# Patient Record
Sex: Male | Born: 1940
Health system: Southern US, Community
[De-identification: ages and names within clinical notes are randomized; demographics above are authoritative.]

## PROBLEM LIST (undated history)

## (undated) DIAGNOSIS — Z86010 Personal history of colonic polyps: Secondary | ICD-10-CM

## (undated) DIAGNOSIS — I10 Essential (primary) hypertension: Secondary | ICD-10-CM

## (undated) DIAGNOSIS — E785 Hyperlipidemia, unspecified: Secondary | ICD-10-CM

## (undated) DIAGNOSIS — K219 Gastro-esophageal reflux disease without esophagitis: Secondary | ICD-10-CM

## (undated) DIAGNOSIS — E119 Type 2 diabetes mellitus without complications: Secondary | ICD-10-CM

## (undated) DIAGNOSIS — M47812 Spondylosis without myelopathy or radiculopathy, cervical region: Secondary | ICD-10-CM

## (undated) DIAGNOSIS — G8929 Other chronic pain: Secondary | ICD-10-CM

## (undated) DIAGNOSIS — M4800 Spinal stenosis, site unspecified: Secondary | ICD-10-CM

## (undated) DIAGNOSIS — M7070 Other bursitis of hip, unspecified hip: Secondary | ICD-10-CM

## (undated) DIAGNOSIS — G4730 Sleep apnea, unspecified: Secondary | ICD-10-CM

## (undated) DIAGNOSIS — N390 Urinary tract infection, site not specified: Secondary | ICD-10-CM

## (undated) DIAGNOSIS — Z8661 Personal history of infections of the central nervous system: Secondary | ICD-10-CM

## (undated) DIAGNOSIS — K648 Other hemorrhoids: Secondary | ICD-10-CM

## (undated) DIAGNOSIS — I252 Old myocardial infarction: Secondary | ICD-10-CM

## (undated) DIAGNOSIS — M1990 Unspecified osteoarthritis, unspecified site: Secondary | ICD-10-CM

## (undated) DIAGNOSIS — R3915 Urgency of urination: Secondary | ICD-10-CM

## (undated) DIAGNOSIS — I700 Atherosclerosis of aorta: Secondary | ICD-10-CM

## (undated) DIAGNOSIS — G4733 Obstructive sleep apnea (adult) (pediatric): Secondary | ICD-10-CM

## (undated) DIAGNOSIS — M545 Low back pain, unspecified: Secondary | ICD-10-CM

## (undated) DIAGNOSIS — I2510 Atherosclerotic heart disease of native coronary artery without angina pectoris: Secondary | ICD-10-CM

## (undated) DIAGNOSIS — R351 Nocturia: Secondary | ICD-10-CM

## (undated) DIAGNOSIS — Z972 Presence of dental prosthetic device (complete) (partial): Secondary | ICD-10-CM

## (undated) DIAGNOSIS — F419 Anxiety disorder, unspecified: Secondary | ICD-10-CM

## (undated) DIAGNOSIS — I350 Nonrheumatic aortic (valve) stenosis: Secondary | ICD-10-CM

## (undated) DIAGNOSIS — R519 Headache, unspecified: Secondary | ICD-10-CM

## (undated) DIAGNOSIS — Z860101 Personal history of adenomatous and serrated colon polyps: Secondary | ICD-10-CM

## (undated) DIAGNOSIS — Z794 Long term (current) use of insulin: Secondary | ICD-10-CM

## (undated) DIAGNOSIS — R51 Headache: Secondary | ICD-10-CM

## (undated) DIAGNOSIS — R208 Other disturbances of skin sensation: Secondary | ICD-10-CM

## (undated) DIAGNOSIS — Z87442 Personal history of urinary calculi: Secondary | ICD-10-CM

## (undated) HISTORY — DX: Atherosclerotic heart disease of native coronary artery without angina pectoris: I25.10

## (undated) HISTORY — DX: Unspecified osteoarthritis, unspecified site: M19.90

## (undated) HISTORY — DX: Hyperlipidemia, unspecified: E78.5

## (undated) HISTORY — DX: Sleep apnea, unspecified: G47.30

## (undated) HISTORY — PX: EYE SURGERY: SHX253

## (undated) HISTORY — DX: Urinary tract infection, site not specified: N39.0

## (undated) HISTORY — PX: LAMINECTOMY: SHX219

## (undated) HISTORY — DX: Other chronic pain: G89.29

## (undated) HISTORY — PX: CHOLECYSTECTOMY: SHX55

## (undated) HISTORY — DX: Essential (primary) hypertension: I10

## (undated) HISTORY — PX: CARDIAC CATHETERIZATION: SHX172

## (undated) HISTORY — DX: Other hemorrhoids: K64.8

## (undated) HISTORY — PX: CARDIOVASCULAR STRESS TEST: SHX262

## (undated) HISTORY — PX: CARDIAC VALVE REPLACEMENT: SHX585

## (undated) HISTORY — PX: TOTAL KNEE ARTHROPLASTY: SHX125

## (undated) HISTORY — DX: Anxiety disorder, unspecified: F41.9

## (undated) HISTORY — PX: CORONARY ANGIOPLASTY WITH STENT PLACEMENT: SHX49

## (undated) HISTORY — DX: Nonrheumatic aortic (valve) stenosis: I35.0

## (undated) HISTORY — DX: Headache, unspecified: R51.9

## (undated) HISTORY — DX: Headache: R51

## (undated) HISTORY — DX: Personal history of infections of the central nervous system: Z86.61

## (undated) HISTORY — DX: Headache, unspecified: G89.29

## (undated) HISTORY — PX: JOINT REPLACEMENT: SHX530

---

## 1997-10-22 ENCOUNTER — Ambulatory Visit (HOSPITAL_COMMUNITY): Admission: RE | Admit: 1997-10-22 | Discharge: 1997-10-22 | Payer: Self-pay | Admitting: Internal Medicine

## 2000-12-22 ENCOUNTER — Ambulatory Visit (HOSPITAL_COMMUNITY): Admission: RE | Admit: 2000-12-22 | Discharge: 2000-12-22 | Payer: Self-pay | Admitting: General Surgery

## 2002-03-12 HISTORY — PX: ROTATOR CUFF REPAIR: SHX139

## 2002-05-15 ENCOUNTER — Encounter: Admission: RE | Admit: 2002-05-15 | Discharge: 2002-08-13 | Payer: Self-pay | Admitting: Specialist

## 2002-08-14 ENCOUNTER — Encounter: Admission: RE | Admit: 2002-08-14 | Discharge: 2002-09-13 | Payer: Self-pay | Admitting: Specialist

## 2003-07-11 ENCOUNTER — Ambulatory Visit (HOSPITAL_COMMUNITY): Admission: RE | Admit: 2003-07-11 | Discharge: 2003-07-11 | Payer: Self-pay | Admitting: Family Medicine

## 2003-09-18 ENCOUNTER — Encounter: Admission: RE | Admit: 2003-09-18 | Discharge: 2003-10-01 | Payer: Self-pay | Admitting: Family Medicine

## 2003-12-11 ENCOUNTER — Inpatient Hospital Stay (HOSPITAL_COMMUNITY): Admission: RE | Admit: 2003-12-11 | Discharge: 2003-12-16 | Payer: Self-pay | Admitting: Orthopedic Surgery

## 2003-12-31 ENCOUNTER — Encounter: Admission: RE | Admit: 2003-12-31 | Discharge: 2004-02-17 | Payer: Self-pay | Admitting: Orthopedic Surgery

## 2004-06-09 ENCOUNTER — Ambulatory Visit: Payer: Self-pay | Admitting: Cardiology

## 2004-06-10 ENCOUNTER — Ambulatory Visit: Admission: RE | Admit: 2004-06-10 | Discharge: 2004-06-10 | Payer: Self-pay | Admitting: Orthopedic Surgery

## 2004-06-12 ENCOUNTER — Ambulatory Visit: Payer: Self-pay | Admitting: Cardiology

## 2004-06-17 ENCOUNTER — Ambulatory Visit (HOSPITAL_COMMUNITY): Admission: RE | Admit: 2004-06-17 | Discharge: 2004-06-17 | Payer: Self-pay | Admitting: Cardiology

## 2004-06-17 ENCOUNTER — Ambulatory Visit: Payer: Self-pay | Admitting: Cardiology

## 2004-06-19 ENCOUNTER — Ambulatory Visit: Payer: Self-pay | Admitting: Cardiology

## 2004-07-31 ENCOUNTER — Inpatient Hospital Stay (HOSPITAL_COMMUNITY): Admission: RE | Admit: 2004-07-31 | Discharge: 2004-08-03 | Payer: Self-pay | Admitting: Orthopedic Surgery

## 2004-08-18 ENCOUNTER — Encounter: Admission: RE | Admit: 2004-08-18 | Discharge: 2004-09-17 | Payer: Self-pay | Admitting: Orthopedic Surgery

## 2004-09-18 ENCOUNTER — Encounter: Admission: RE | Admit: 2004-09-18 | Discharge: 2004-09-30 | Payer: Self-pay | Admitting: Orthopedic Surgery

## 2004-12-11 DIAGNOSIS — I252 Old myocardial infarction: Secondary | ICD-10-CM

## 2004-12-11 HISTORY — DX: Old myocardial infarction: I25.2

## 2004-12-16 ENCOUNTER — Ambulatory Visit: Payer: Self-pay | Admitting: Cardiology

## 2004-12-17 ENCOUNTER — Encounter: Payer: Self-pay | Admitting: Cardiology

## 2004-12-17 ENCOUNTER — Inpatient Hospital Stay (HOSPITAL_COMMUNITY): Admission: AD | Admit: 2004-12-17 | Discharge: 2004-12-20 | Payer: Self-pay | Admitting: Cardiology

## 2004-12-17 ENCOUNTER — Ambulatory Visit: Payer: Self-pay | Admitting: Internal Medicine

## 2005-01-14 ENCOUNTER — Ambulatory Visit: Payer: Self-pay | Admitting: Cardiology

## 2005-04-05 ENCOUNTER — Ambulatory Visit: Payer: Self-pay | Admitting: Internal Medicine

## 2005-04-05 ENCOUNTER — Ambulatory Visit (HOSPITAL_COMMUNITY): Admission: RE | Admit: 2005-04-05 | Discharge: 2005-04-05 | Payer: Self-pay | Admitting: Family Medicine

## 2005-04-05 ENCOUNTER — Encounter: Payer: Self-pay | Admitting: Vascular Surgery

## 2005-05-31 ENCOUNTER — Ambulatory Visit: Payer: Self-pay | Admitting: Internal Medicine

## 2005-06-17 ENCOUNTER — Ambulatory Visit: Payer: Self-pay | Admitting: Cardiology

## 2005-06-21 ENCOUNTER — Ambulatory Visit: Payer: Self-pay | Admitting: Cardiology

## 2005-07-20 ENCOUNTER — Encounter (INDEPENDENT_AMBULATORY_CARE_PROVIDER_SITE_OTHER): Payer: Self-pay | Admitting: Specialist

## 2005-07-20 ENCOUNTER — Ambulatory Visit: Payer: Self-pay | Admitting: Internal Medicine

## 2005-07-21 ENCOUNTER — Ambulatory Visit: Payer: Self-pay | Admitting: Cardiology

## 2006-02-21 ENCOUNTER — Ambulatory Visit: Payer: Self-pay | Admitting: Cardiology

## 2006-07-20 ENCOUNTER — Ambulatory Visit: Payer: Self-pay | Admitting: Cardiology

## 2006-07-29 ENCOUNTER — Ambulatory Visit: Payer: Self-pay | Admitting: Cardiology

## 2006-07-29 ENCOUNTER — Encounter: Payer: Self-pay | Admitting: Physician Assistant

## 2006-08-05 ENCOUNTER — Ambulatory Visit: Payer: Self-pay | Admitting: Cardiology

## 2007-06-11 ENCOUNTER — Ambulatory Visit: Payer: Self-pay | Admitting: Cardiology

## 2007-07-27 ENCOUNTER — Encounter: Payer: Self-pay | Admitting: Internal Medicine

## 2007-11-02 ENCOUNTER — Encounter: Payer: Self-pay | Admitting: Internal Medicine

## 2007-11-09 ENCOUNTER — Encounter: Payer: Self-pay | Admitting: Internal Medicine

## 2007-11-15 ENCOUNTER — Encounter: Payer: Self-pay | Admitting: Internal Medicine

## 2007-11-22 ENCOUNTER — Encounter: Payer: Self-pay | Admitting: Internal Medicine

## 2007-12-27 ENCOUNTER — Ambulatory Visit: Payer: Self-pay | Admitting: Internal Medicine

## 2007-12-27 DIAGNOSIS — R740 Nonspecific elevation of levels of transaminase and lactic acid dehydrogenase [LDH]: Secondary | ICD-10-CM

## 2007-12-27 DIAGNOSIS — R7401 Elevation of levels of liver transaminase levels: Secondary | ICD-10-CM | POA: Insufficient documentation

## 2007-12-27 DIAGNOSIS — K219 Gastro-esophageal reflux disease without esophagitis: Secondary | ICD-10-CM | POA: Insufficient documentation

## 2007-12-27 LAB — CONVERTED CEMR LAB
ALT: 25 units/L (ref 0–53)
AST: 25 units/L (ref 0–37)
Albumin: 3.6 g/dL (ref 3.5–5.2)
Alkaline Phosphatase: 76 units/L (ref 39–117)
Bilirubin, Direct: 0.2 mg/dL (ref 0.0–0.3)
Total Bilirubin: 0.8 mg/dL (ref 0.3–1.2)
Total Protein: 7 g/dL (ref 6.0–8.3)

## 2008-01-08 ENCOUNTER — Encounter: Payer: Self-pay | Admitting: Internal Medicine

## 2008-01-08 ENCOUNTER — Ambulatory Visit: Payer: Self-pay | Admitting: Internal Medicine

## 2008-01-08 HISTORY — PX: UPPER GASTROINTESTINAL ENDOSCOPY: SHX188

## 2008-01-14 ENCOUNTER — Encounter: Payer: Self-pay | Admitting: Internal Medicine

## 2008-02-17 ENCOUNTER — Encounter: Payer: Self-pay | Admitting: Cardiology

## 2008-02-20 ENCOUNTER — Encounter: Payer: Self-pay | Admitting: Cardiology

## 2008-02-23 ENCOUNTER — Observation Stay (HOSPITAL_COMMUNITY): Admission: EM | Admit: 2008-02-23 | Discharge: 2008-02-23 | Payer: Self-pay | Admitting: Emergency Medicine

## 2008-03-11 ENCOUNTER — Emergency Department (HOSPITAL_COMMUNITY): Admission: EM | Admit: 2008-03-11 | Discharge: 2008-03-11 | Payer: Self-pay | Admitting: Emergency Medicine

## 2008-03-12 ENCOUNTER — Encounter: Admission: RE | Admit: 2008-03-12 | Discharge: 2008-03-12 | Payer: Self-pay | Admitting: Gastroenterology

## 2008-03-14 ENCOUNTER — Ambulatory Visit (HOSPITAL_COMMUNITY): Admission: RE | Admit: 2008-03-14 | Discharge: 2008-03-14 | Payer: Self-pay | Admitting: Gastroenterology

## 2008-03-14 ENCOUNTER — Encounter: Payer: Self-pay | Admitting: Internal Medicine

## 2008-03-14 HISTORY — PX: ERCP: SHX60

## 2008-07-12 ENCOUNTER — Encounter (INDEPENDENT_AMBULATORY_CARE_PROVIDER_SITE_OTHER): Payer: Self-pay | Admitting: *Deleted

## 2008-08-15 ENCOUNTER — Ambulatory Visit: Payer: Self-pay | Admitting: Cardiology

## 2008-08-26 ENCOUNTER — Ambulatory Visit: Payer: Self-pay | Admitting: Internal Medicine

## 2008-08-26 DIAGNOSIS — Z860100 Personal history of colon polyps, unspecified: Secondary | ICD-10-CM | POA: Insufficient documentation

## 2008-08-30 LAB — CONVERTED CEMR LAB
ALT: 19 units/L (ref 0–53)
AST: 20 units/L (ref 0–37)
Albumin: 3.8 g/dL (ref 3.5–5.2)
Alkaline Phosphatase: 50 units/L (ref 39–117)
Bilirubin, Direct: 0.2 mg/dL (ref 0.0–0.3)
Total Bilirubin: 0.9 mg/dL (ref 0.3–1.2)
Total Protein: 7.2 g/dL (ref 6.0–8.3)

## 2008-09-02 ENCOUNTER — Encounter: Payer: Self-pay | Admitting: Internal Medicine

## 2008-09-10 ENCOUNTER — Ambulatory Visit: Payer: Self-pay | Admitting: Internal Medicine

## 2008-09-10 HISTORY — PX: COLONOSCOPY: SHX5424

## 2008-10-17 ENCOUNTER — Telehealth: Payer: Self-pay | Admitting: Cardiology

## 2008-10-17 ENCOUNTER — Encounter: Payer: Self-pay | Admitting: Cardiology

## 2008-10-17 DIAGNOSIS — I2510 Atherosclerotic heart disease of native coronary artery without angina pectoris: Secondary | ICD-10-CM | POA: Insufficient documentation

## 2008-10-19 DIAGNOSIS — I10 Essential (primary) hypertension: Secondary | ICD-10-CM

## 2008-10-19 DIAGNOSIS — I152 Hypertension secondary to endocrine disorders: Secondary | ICD-10-CM | POA: Insufficient documentation

## 2008-10-19 DIAGNOSIS — E1169 Type 2 diabetes mellitus with other specified complication: Secondary | ICD-10-CM | POA: Insufficient documentation

## 2008-10-19 DIAGNOSIS — E1159 Type 2 diabetes mellitus with other circulatory complications: Secondary | ICD-10-CM

## 2008-10-19 DIAGNOSIS — E785 Hyperlipidemia, unspecified: Secondary | ICD-10-CM

## 2008-10-22 ENCOUNTER — Ambulatory Visit: Payer: Self-pay | Admitting: Cardiology

## 2008-10-29 ENCOUNTER — Encounter (INDEPENDENT_AMBULATORY_CARE_PROVIDER_SITE_OTHER): Payer: Self-pay | Admitting: *Deleted

## 2008-12-11 ENCOUNTER — Inpatient Hospital Stay (HOSPITAL_COMMUNITY): Admission: RE | Admit: 2008-12-11 | Discharge: 2008-12-15 | Payer: Self-pay | Admitting: Orthopedic Surgery

## 2008-12-20 HISTORY — PX: TOTAL HIP ARTHROPLASTY: SHX124

## 2009-01-15 ENCOUNTER — Encounter: Payer: Self-pay | Admitting: Internal Medicine

## 2009-01-16 ENCOUNTER — Ambulatory Visit: Payer: Self-pay | Admitting: Internal Medicine

## 2009-01-16 DIAGNOSIS — J61 Pneumoconiosis due to asbestos and other mineral fibers: Secondary | ICD-10-CM | POA: Insufficient documentation

## 2009-01-16 DIAGNOSIS — R635 Abnormal weight gain: Secondary | ICD-10-CM | POA: Insufficient documentation

## 2009-08-18 IMAGING — US US ABDOMEN COMPLETE
1 series · 13 of 25 positions shown · non-contrast
Comparison: CT abdomen pelvis of 03/11/2008

CLINICAL DATA: Elevated liver function tests, abdominal pain

ABDOMEN ULTRASOUND
TECHNIQUE: Complete abdominal ultrasound examination was performed
including evaluation of the liver, gallbladder, bile ducts,
pancreas, kidneys, spleen, IVC, and abdominal aorta.

[Series 1: us abdomen complete · 0.41mm/px · 13 of 78 slices shown]
[im 1/78]
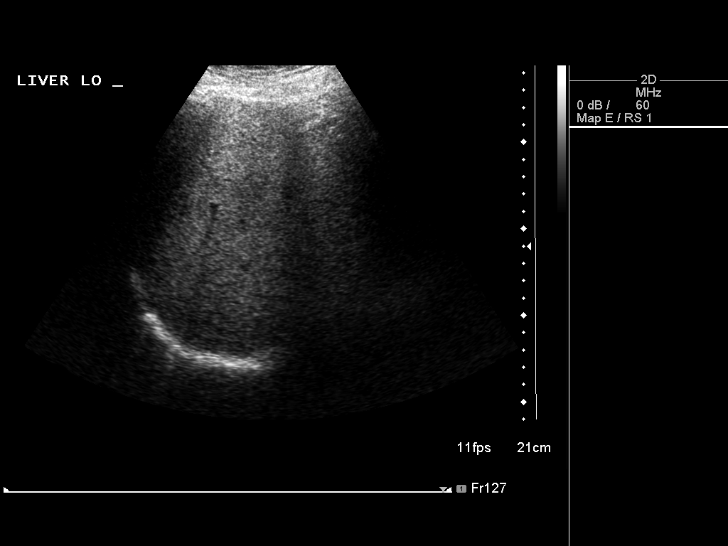
[im 7/78]
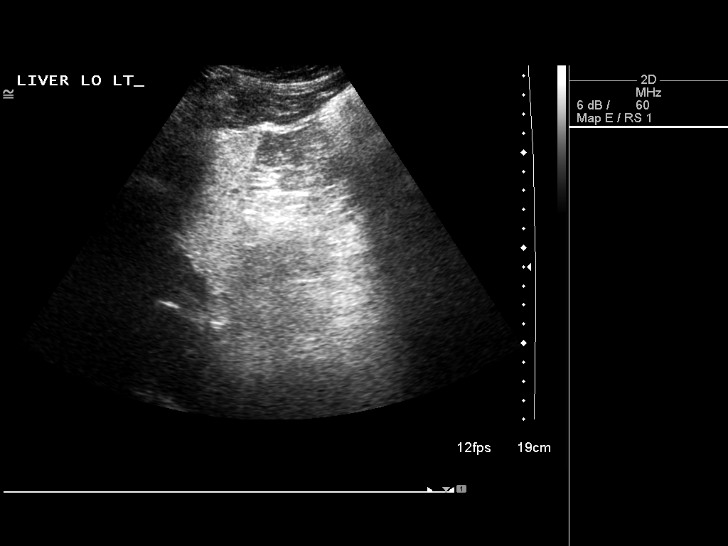
[im 13/78]
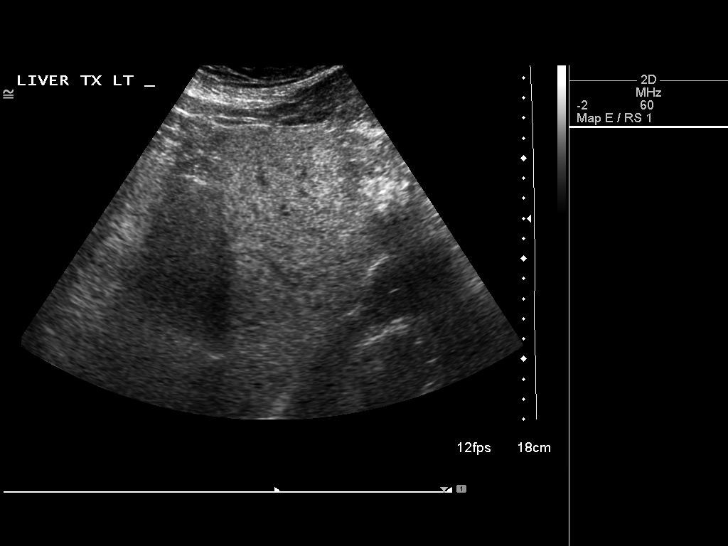
[im 20/78]
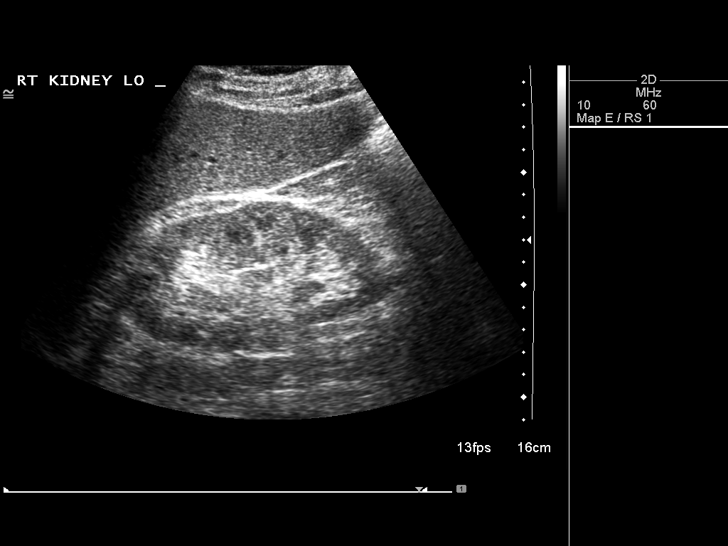
[im 26/78]
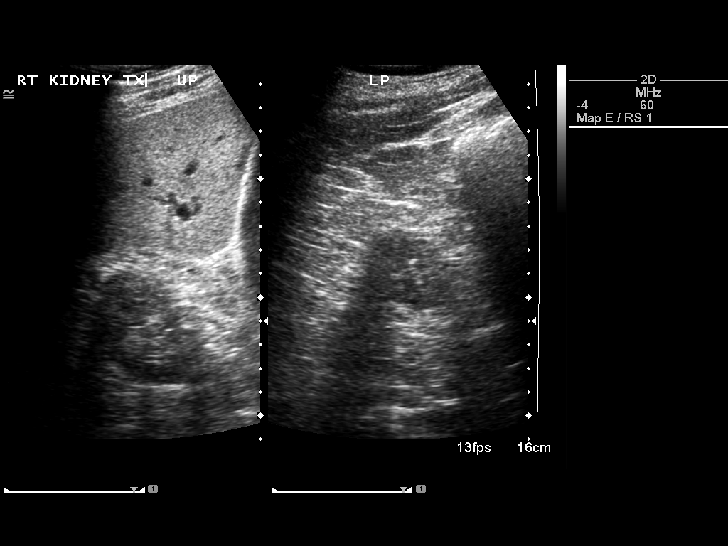
[im 33/78]
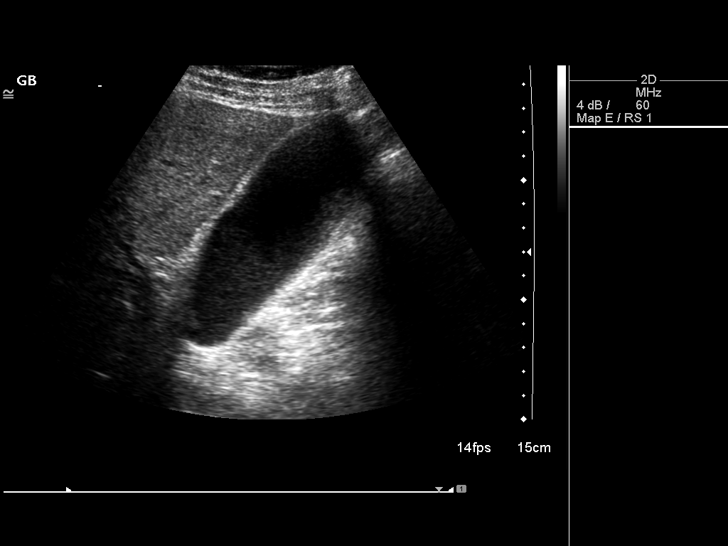
[im 39/78]
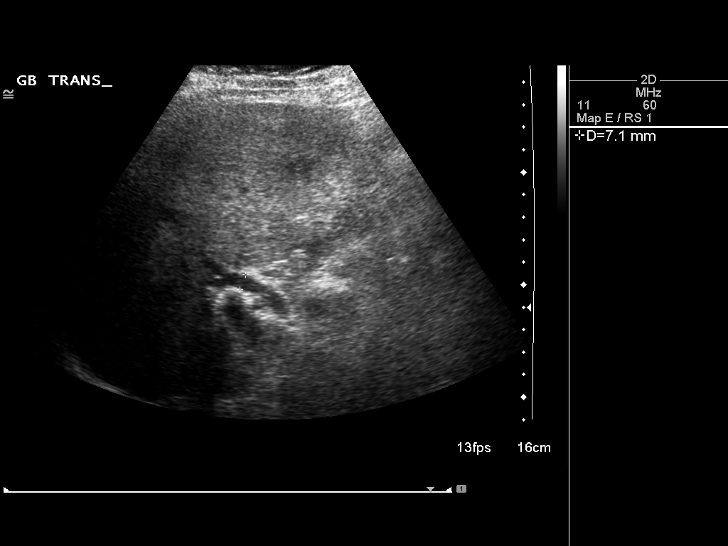
[im 45/78]
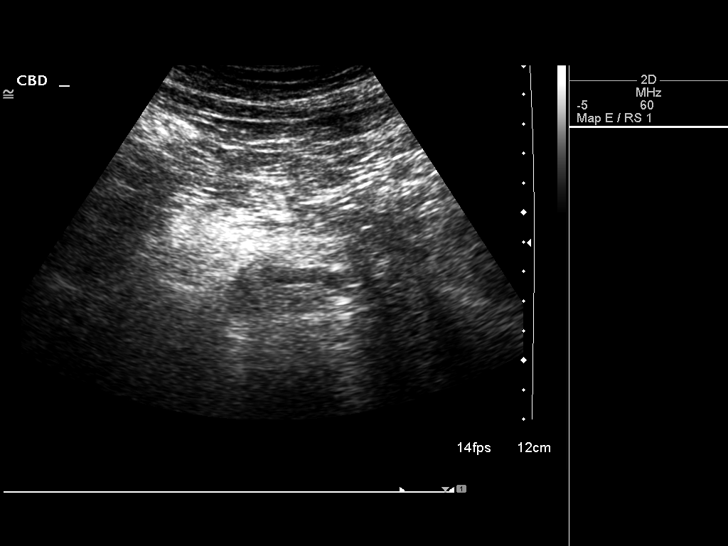
[im 52/78]
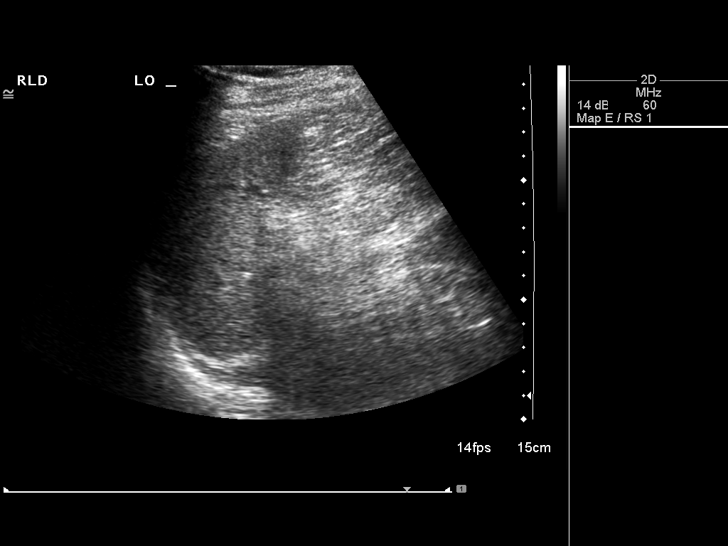
[im 58/78]
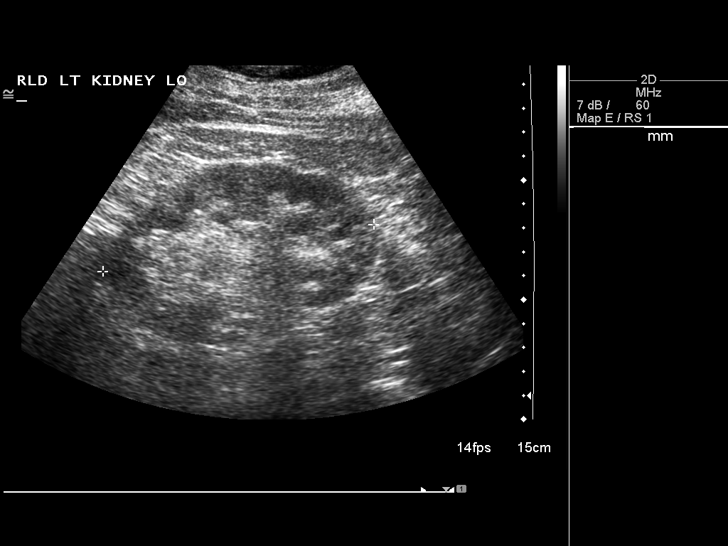
[im 65/78]
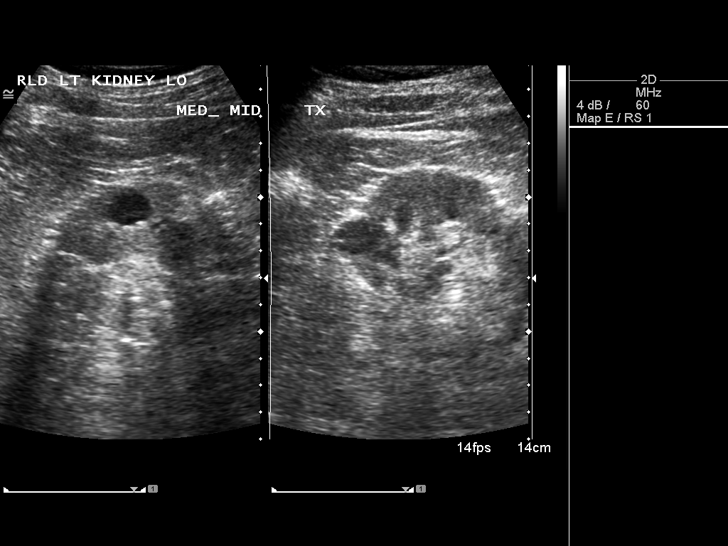
[im 71/78]
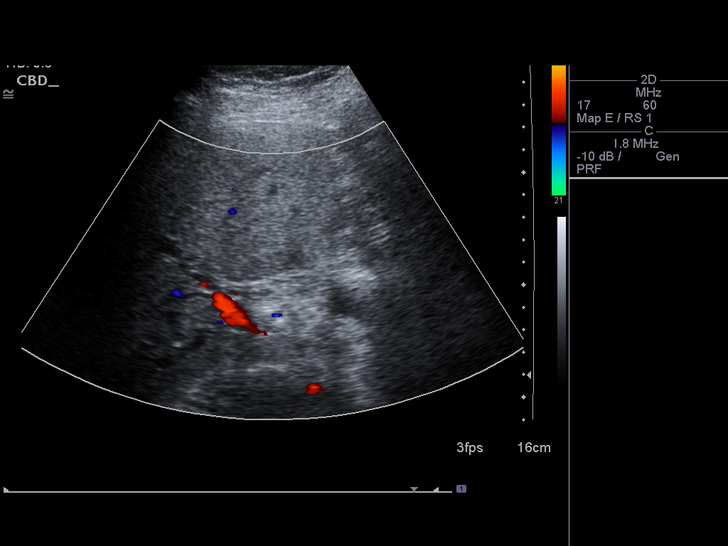
[im 78/78]
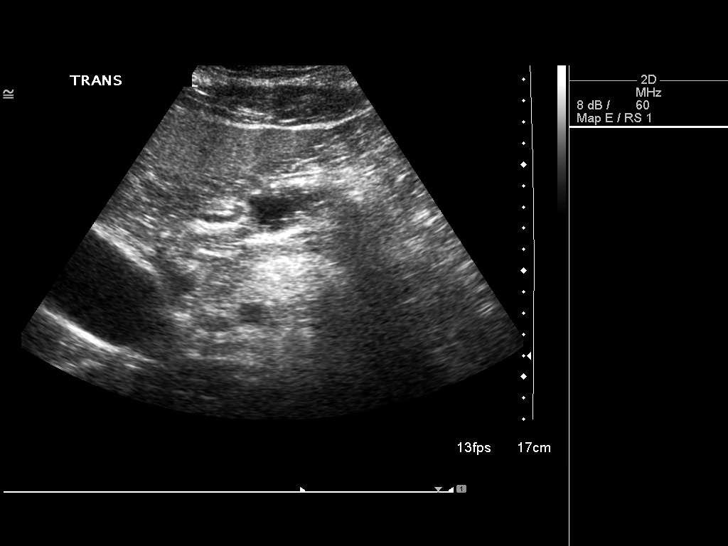

[13 of 25 positions shown; findings below may reference images not displayed]

FINDINGS: The gallbladder is slightly distended and there is some
sludge present.  There are ring down artifacts from the gallbladder
wall suggesting changes of adenomyomatosis.  No gallbladder
tenderness is noted upon palpation.  The liver is echogenic
consistent with fatty infiltration.  There is mild intrahepatic
ductal dilatation present, and the common bile duct is slightly
prominent measuring up to 7.1 mm in diameter.  However no definite
mass is seen.  The head of the pancreas is obscured by bowel gas
but was moderately well seen on CT yesterday with no evidence of
pancreatic head lesion.  ERCP may be helpful to assess for
stricture or distal common bile duct calculus versus subtle mass.
The IVC is obscured by bowel gas.  The spleen is normal.  No
hydronephrosis is noted.  The right kidney measures 12.5 cm
sagittally, with left kidney measuring 12.3 cm.  Renal cysts are
noted bilaterally with parapelvic cysts noted on the left.  The
aorta is partially obscured by bowel gas.
IMPRESSION: 1.  Slight prominence of intrahepatic bile ducts as well as minimal
prominence of the common bile duct.  No definite distal common bile
duct mass on yesterday's CT.  Consider distal common bile duct
stricture or calculus versus subtle mass.  ERCP may be helpful to
assess further.
2.  Changes of gallbladder adenomyomatosis.
3.  Bowel gas does obscure some anatomic detail as noted above.
Pancreatic head is not well seen.
4.  Fatty infiltration of the liver.

## 2009-08-20 IMAGING — RF DG ERCP WO/W SPHINCTEROTOMY
1 series · 5 of 5 positions shown · IV contrast (agent unspecified)
Comparison: Abdominal ultrasound 03/12/2008.  CT abdomen
03/11/2008.

CLINICAL DATA: 68-year-old male undergoing ERCP.

ERCP
Fluoroscopy Time: 5-minute-9-second.
Contrast: Not specified.

[Series 1: run · 5 of 5 slices shown]
[im 1/5]
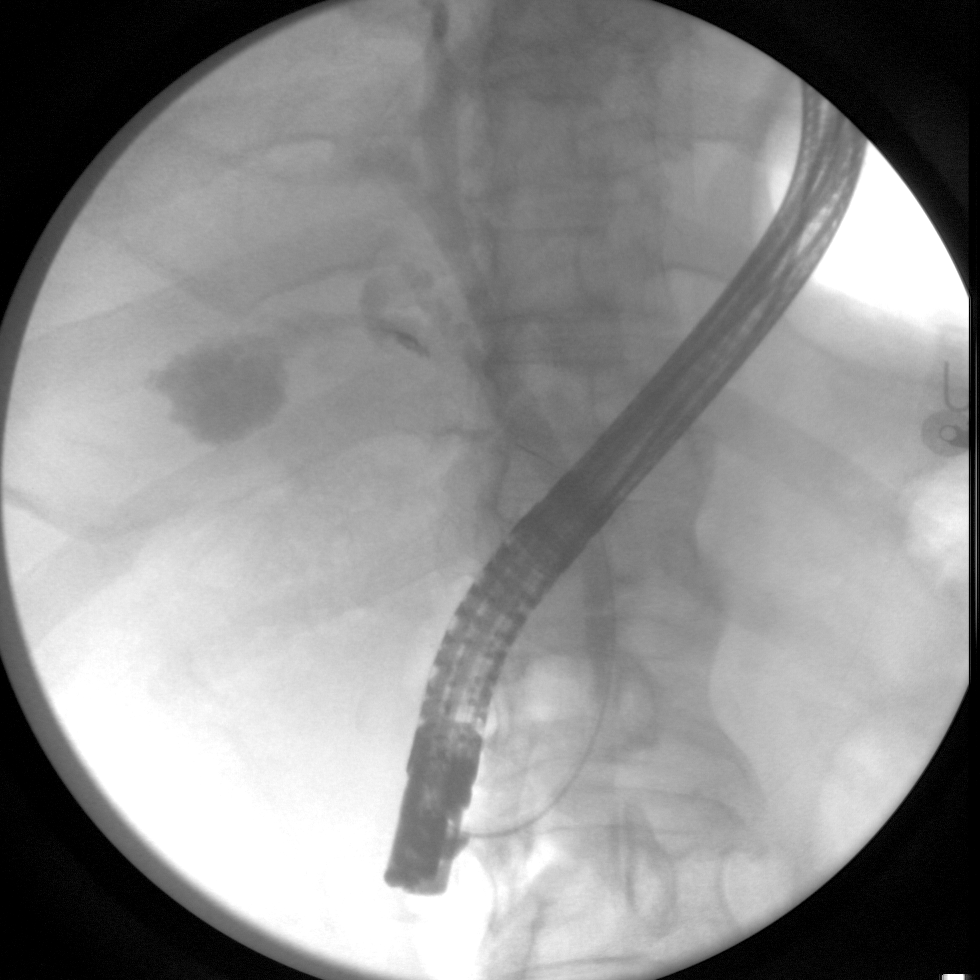
[im 2/5]
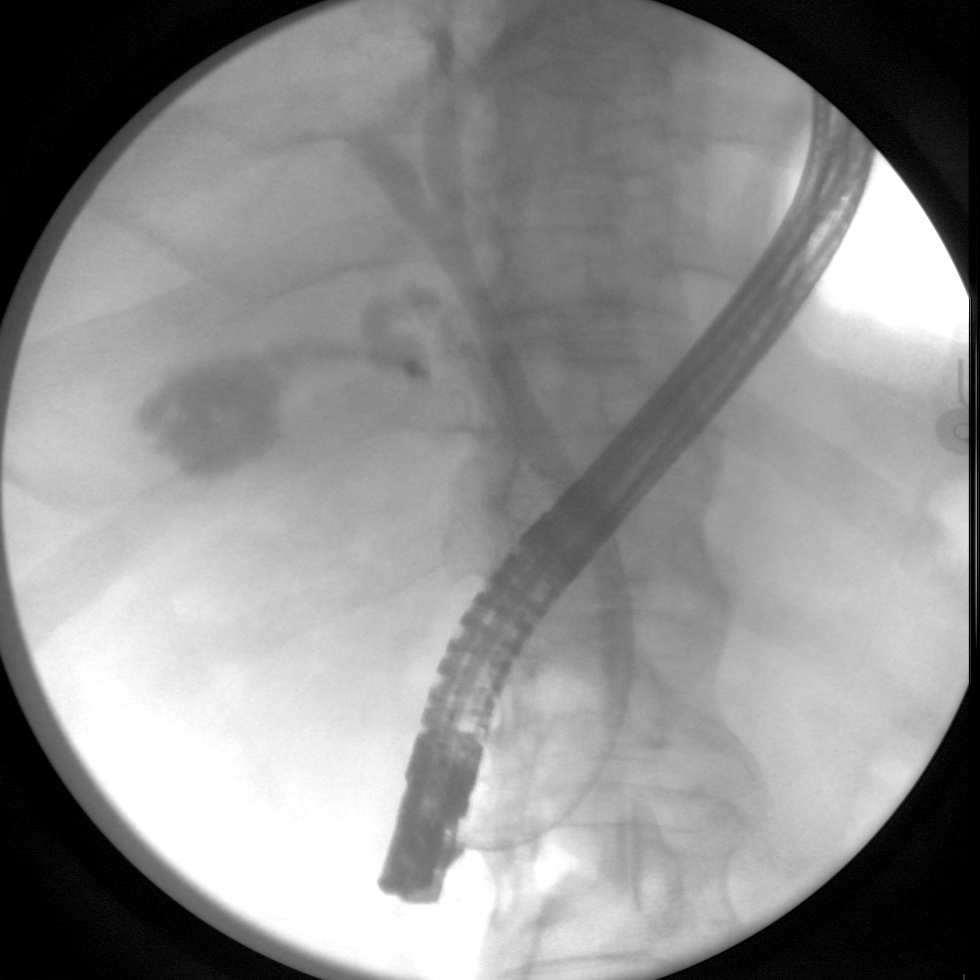
[im 3/5]
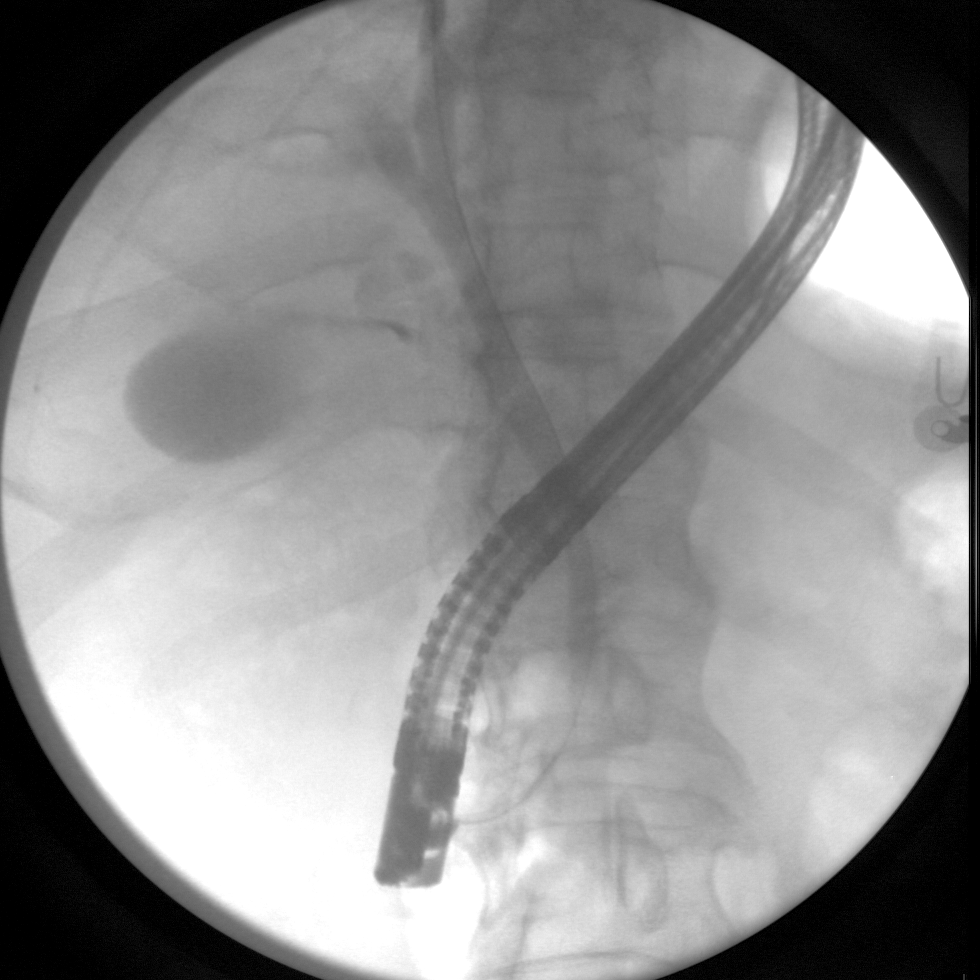
[im 4/5]
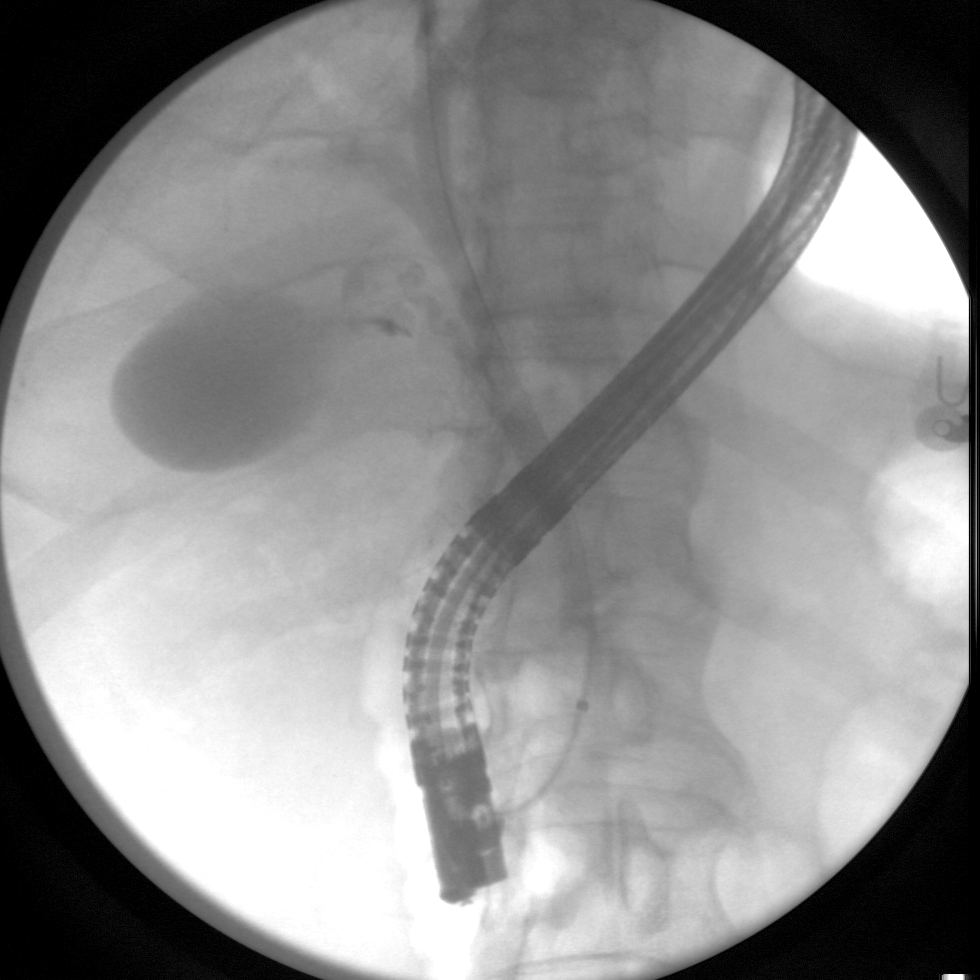
[im 5/5]
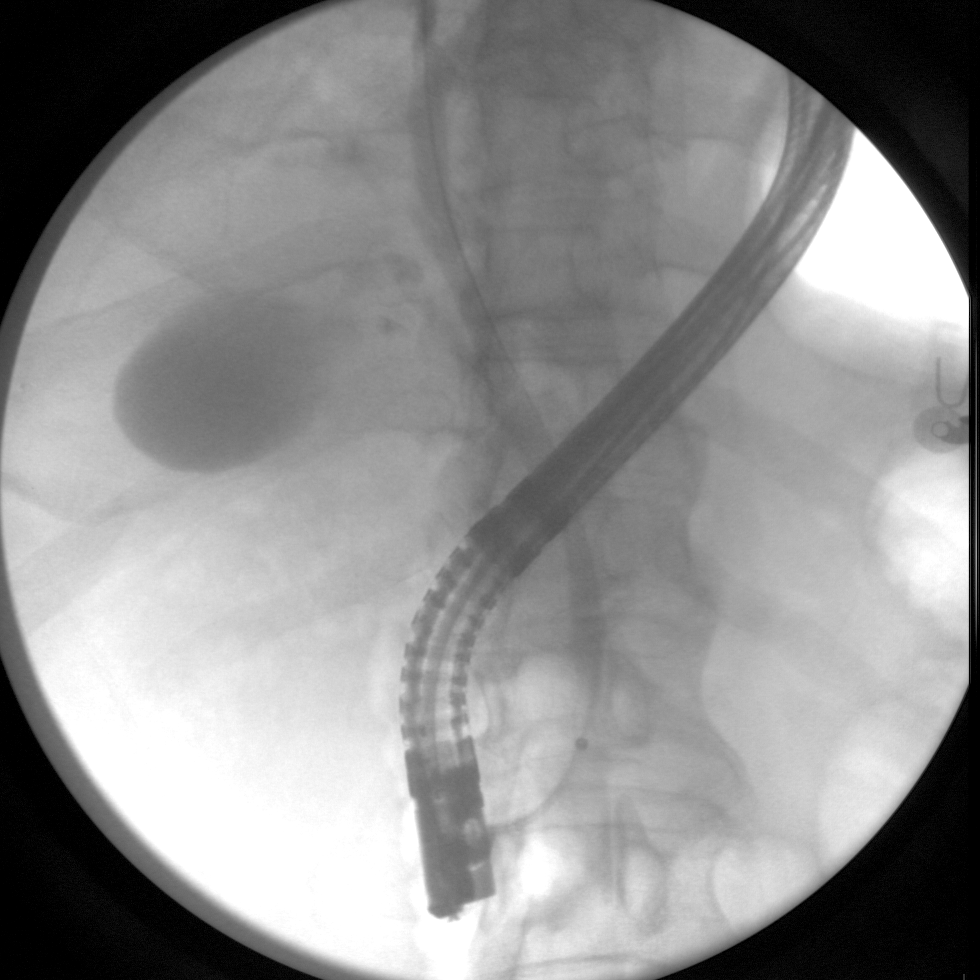

[5 of 5 positions shown; findings below may reference images not displayed]

FINDINGS: Five intraoperative fluoroscopic spot views coned-down on
the right upper quadrant.  Endoscope in place on the first view
with wire access to the CBD.  Small volume of contrast has been
injected outlining the intra and extra panic biliary ducts, the
cystic duct, and a portion of the gallbladder neck.  Oral contrast
is injected on the additional views.  No definite filling defects
are identified on these images.  The fourth image suggests balloon
sweeping of the duct.  Endoscope and wire access remain in place on
the final image.
IMPRESSION: Cannulation of the CBD with contrast outlining intra, extrahepatic
bile duct, the cystic duct and gallbladder neck. No definite
filling defects identified, correlate with endoscopic findings.

## 2009-09-25 ENCOUNTER — Ambulatory Visit: Payer: Self-pay | Admitting: Cardiology

## 2009-09-25 DIAGNOSIS — R079 Chest pain, unspecified: Secondary | ICD-10-CM | POA: Insufficient documentation

## 2009-10-13 ENCOUNTER — Encounter: Payer: Self-pay | Admitting: Cardiology

## 2009-12-27 ENCOUNTER — Encounter: Payer: Self-pay | Admitting: Cardiology

## 2009-12-29 ENCOUNTER — Encounter: Payer: Self-pay | Admitting: Cardiology

## 2010-02-10 NOTE — Letter (Signed)
Summary: [employer]  [employer]   Imported By: Sherian Rein 01/22/2009 09:56:22  _____________________________________________________________________  External Attachment:    Type:   Image     Comment:   External Document

## 2010-02-10 NOTE — Assessment & Plan Note (Signed)
Summary: Pulmonary/ new pt eval for ? asbestosis - insert ct report   Visit Type:  Initial Consult Copy to:  Helene Kelp PA Primary Provider/Referring Provider:  Rudi Heap, MD  CC:  Asbestosis.  History of Present Illness: 4 yowm smoker quit 1990's with good ex tolerance.  January 16, 2009 cc slowed by R hip and knees all replaced do notice doe x inclines x years , no real worsening and mild orthopnea.  no sign cough. Pt denies any significant sore throat, dysphagia, itching, sneezing,  nasal congestion or excess secretions,  fever, chills, sweats, unintended wt loss, pleuritic or exertional cp, hempoptysis, change in activity tolerance  orthopnea pnd or leg swelling    Current Medications (verified): 1)  Diovan Hct 320-12.5 Mg Tabs (Valsartan-Hydrochlorothiazide) .... Take 1 Tablet By Mouth Once A Day 2)  Metformin Hcl 500 Mg Xr24h-Tab (Metformin Hcl) .... Take 2 Tablets By Mouth Two Times A Day 3)  Aspirin Ec 325 Mg Tbec (Aspirin) .... Take 1 Tablet By Mouth Once A Day 4)  Paroxetine Hcl 20 Mg Tabs (Paroxetine Hcl) .... Take 1 Tablet By Mouth Once A Day 5)  Metoprolol Tartrate 25 Mg Tabs (Metoprolol Tartrate) .... Take 1/2 Tablet By Mouth Once Daily 6)  Osteo Bi-Flex Adv Triple St  Tabs (Misc Natural Products) .... Take 2 Tablets By Mouth Once Daily 7)  Crestor 10 Mg Tabs (Rosuvastatin Calcium) .... Take 1 Tablet By Mouth Once A Day 8)  Nitrostat 0.4 Mg Subl (Nitroglycerin) .... As Needed Chest Pains 9)  Lantus Solostar 100 Unit/ml Soln (Insulin Glargine) .... 30 Units At Bedtime 10)  Fenofibrate 160 Mg Tabs (Fenofibrate) .Marland Kitchen.. 1 Once Daily 11)  Famotidine 20 Mg  Tabs (Famotidine) .Marland Kitchen.. 1 Two Times A Day 30 Mins Before Breakfast and Supper 12)  Avalide 300-12.5 Mg Tabs (Irbesartan-Hydrochlorothiazide) .Marland Kitchen.. 1 Once Daily 13)  Celebrex 200 Mg Caps (Celecoxib) .Marland Kitchen.. 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 Family History of Breast Cancer:Sister Family History of Colon Cancer:Mother (diagnosed age 37) Family History of Heart Disease: Father, Brother  Social History: Occupation: Retired Married Patient is a former smoker. Quit Aug 1990.  Smoked for 25 yrs up to 1 ppd Alcohol Use - yes, very rare Daily Caffeine Use Illicit Drug Use - no Patient does not get regular exercise.  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 [phone]  Vital Signs:  Patient profile:   70 year old male Weight:      205 pounds O2 Sat:      95 % on Room air Temp:     97.5 degrees F oral Pulse rate:   65 / minute BP sitting:   140 / 74  (left arm)  Vitals Entered By: Vernie Murders (January 16, 2009 3:22 PM)  O2 Flow:  Room air  Serial Vital Signs/Assessments:  Comments: 4:13 PM Ambulatory Pulse Oximetry  Resting; HR__72___    02 Sat__97%ra___  Lap1 (185 feet)   HR__80___   02 Sat__96%ra___ Lap2 (185 feet)   HR__86___   02 Sat__94%ra___    Lap3 (185 feet)   HR__87___   02 Sat__94%ra___  _x__Test Completed without Difficulty ___Test Stopped due to:   By: Vernie Murders    Physical Exam  Additional Exam:  mod obese wm nad HEENT: nl dentition, turbinates, and orophanx. Nl external ear canals without cough reflex NECK :  without JVD/Nodes/TM/ nl carotid upstrokes bilaterally LUNGS: no acc muscle use, clear to A and P bilaterally without cough on insp or exp maneuvers CV:  RRR  no s3 or murmur or increase in P2,  no edema  ABD:  soft and nontender with nl excursion in the supine position. No bruits or organomegaly, bowel sounds nl MS:  warm without deformities, calf tenderness, cyanosis or clubbing SKIN: warm and dry without lesions   NEURO:  alert, approp, no deficits     Pulmonary Function Test Date: 01/16/2009 3:44 PM Gender: Male  Pre-Spirometry FVC    Value: 2.91 L/min   % Pred: 76.50 % FEV1    Value: 2.37 L     Pred: 2.80 L     % Pred: 84.70 % FEV1/FVC  Value: 81.57 %     % Pred: 110.20 %  Impression & Recommendations:  Problem # 1:  ASBESTOSIS (ICD-501) No sign restrictive changes or desats so follow conservatively  Problem # 2:  WEIGHT GAIN, ABNORMAL (ICD-783.1) Restrictive changes may be partly related to obesity.  Weight control is a matter of calorie balance which needs to be tilted in the pt's favor by eating less and exercising more.  Specifically, I recommended  exercise at a level where pt  is short of breath but not out of breath 30 minutes daily.  If not losing weight on this program, I would strongly recommend pt see a nutritionist with a food diary recorded for two weeks prior to the visit.     Medications Added to Medication List This Visit: 1)  Fenofibrate 160 Mg Tabs (Fenofibrate) .Marland Kitchen.. 1 once daily 2)  Avalide 300-12.5 Mg Tabs (Irbesartan-hydrochlorothiazide) .Marland Kitchen.. 1 once daily 3)  Celebrex 200 Mg Caps (Celecoxib) .Marland Kitchen.. 1 once daily  Other Orders: Pulse Oximetry, Ambulatory (16606) New Patient Level IV (30160)  Patient Instructions: 1)  We will send your lawyer a report of your studies 2)  your weight and asbestos have the same effect in terms of reducing lung volumes 3)  Weight control is simply a matter of calorie balance which needs to be tilted in your favor by eating less and exercising more.  To get the most out of exercise, you need to be continuously aware that you are short of breath, but never out of breath, for 30 minutes daily. As you improve, it will  actually be easier for you to do the same amount in  30 minutes so always push to the level where you are short of breath.  If this does not result in gradual weight reduction,  I recommend  a nutritionist for a food diary.   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

## 2010-02-10 NOTE — Assessment & Plan Note (Signed)
Summary: 1 YR FU PER AUG REMINDER   Visit Type:  Follow-up Referring Provider:  Helene Kelp PA Primary Provider:  Rudi Heap, MD   History of Present Illness: the patient is a 70 year old male with history of coronary artery disease, stent placement to the right coronary artery and residual mid 70% LAD disease.  The patient had a Cardiolite study in 2008 which was low risk.  He has a history of osteoarthritis.  He is hypertensive in the office today but denies any chest pain shortness of breath orthopnea PND he has no palpitations or syncope.  He TGE oxidase within normal limits.  He does report some positional related chest pain but has not required any nitroglycerin.  His pain appears to be very atypical.  Preventive Screening-Counseling & Management  Alcohol-Tobacco     Smoking Status: quit     Year Quit: 1991  Current Medications (verified): 1)  Diovan Hct 320-12.5 Mg Tabs (Valsartan-Hydrochlorothiazide) .... Take 1 Tablet By Mouth Once A Day 2)  Metformin Hcl 500 Mg Xr24h-Tab (Metformin Hcl) .... Take 2 Tablets By Mouth Two Times A Day 3)  Aspirin Ec 325 Mg Tbec (Aspirin) .... Take 1 Tablet By Mouth Once A Day 4)  Paroxetine Hcl 20 Mg Tabs (Paroxetine Hcl) .... Take 1 Tablet By Mouth Once A Day 5)  Metoprolol Tartrate 25 Mg Tabs (Metoprolol Tartrate) .... Take 1/2 Tablet By Mouth Once Daily 6)  Osteo Bi-Flex Adv Triple St  Tabs (Misc Natural Products) .... Take 2 Tablets By Mouth Once Daily 7)  Crestor 10 Mg Tabs (Rosuvastatin Calcium) .... Take 1 Tablet By Mouth Once A Day 8)  Nitrostat 0.4 Mg Subl (Nitroglycerin) .... As Needed Chest Pains 9)  Lantus Solostar 100 Unit/ml Soln (Insulin Glargine) .... Use As Directed 10)  Famotidine 20 Mg  Tabs (Famotidine) .Marland Kitchen.. 1 Two Times A Day 30 Mins Before Breakfast and Supper 11)  Novolog 100 Unit/ml Soln (Insulin Aspart) .... Sliding Scale Use As Directed  Allergies (verified): 1)  * Altace 2)  Doxycycline 3)   Percocet  Comments:  Nurse/Medical Assistant: The patient's medication list and allergies were reviewed with the patient and were updated in the Medication and Allergy Lists.  Past History:  Past Medical History: Diabetes Anxiety Disorder Arthritis HYPERTENSION, UNSPECIFIED (ICD-401.9) HYPERLIPIDEMIA-MIXED (ICD-272.4) CORONARY ATHEROSCLEROSIS NATIVE CORONARY ARTERY (ICD-414.01) status post stent placement to the right coronary artery residual mid 70% LAD disease Cardiolite study 2000 and a low risk.  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  The patient denies fatigue, malaise, fever, weight gain/loss, vision loss, decreased hearing, hoarseness, palpitations, shortness of breath, prolonged cough, wheezing, sleep apnea, coughing up blood, abdominal pain, blood in stool, nausea, vomiting, diarrhea, heartburn, incontinence, blood in urine, muscle weakness, joint pain, leg swelling, rash, skin lesions, headache, fainting, dizziness, depression, anxiety, enlarged lymph nodes, easy bruising or bleeding, and environmental allergies.    Vital Signs:  Patient profile:   70 year old male Height:      66 inches Weight:      211 pounds BMI:     34.18 Pulse rate:   68 / minute BP sitting:   176 / 79  (left arm) Cuff size:   regular  Vitals Entered By: Carlye Grippe (September 25, 2009 10:45 AM)  Nutrition Counseling: Patient's BMI is greater than 25 and therefore counseled on weight management options.  Physical Exam  Additional Exam:  General: Well-developed, well-nourished in no distress head: Normocephalic and atraumatic eyes PERRLA/EOMI  intact, conjunctiva and lids normal nose: No deformity or lesions mouth normal dentition, normal posterior pharynx neck: Supple, no JVD.  No masses, thyromegaly or abnormal cervical nodes lungs: Normal breath sounds bilaterally without wheezing.  Normal percussion heart: regular rate and rhythm with normal S1 and S2, no S3 or S4.  PMI is normal.  No pathological murmurs abdomen: Normal bowel sounds, abdomen is soft and nontender without masses, organomegaly or hernias noted.  No hepatosplenomegaly musculoskeletal: Back normal, normal gait muscle strength and tone normal pulsus: Pulse is normal in all 4 extremities Extremities: No peripheral pitting edema neurologic: Alert and oriented x 3 skin: Intact without lesions or rashes cervical nodes: No significant adenopathy psychologic: Normal affect    EKG  Procedure date:  09/28/2009  Findings:      normal sinus rhythm.  Normal EKG heart rate 63 beats/min  Impression & Recommendations:  Problem # 1:  CORONARY ATHEROSCLEROSIS NATIVE CORONARY ARTERY (ICD-414.01) atypical chest pain.  No definite indication for ischemia workup.  Patient has not required nitroglycerin. His updated medication list for this problem includes:    Aspirin Ec 325 Mg Tbec (Aspirin) .Marland Kitchen... Take 1 tablet by mouth once a day    Metoprolol Tartrate 25 Mg Tabs (Metoprolol tartrate) .Marland Kitchen... Take 1/2 tablet by mouth once daily    Nitrostat 0.4 Mg Subl (Nitroglycerin) .Marland Kitchen... As needed chest pains  Problem # 2:  HYPERLIPIDEMIA-MIXED (ICD-272.4) followed by primary care physician. The following medications were removed from the medication list:    Fenofibrate 160 Mg Tabs (Fenofibrate) .Marland Kitchen... 1 once daily His updated medication list for this problem includes:    Crestor 10 Mg Tabs (Rosuvastatin calcium) .Marland Kitchen... Take 1 tablet by mouth once a day  Problem # 3:  HYPERTENSION, UNSPECIFIED (ICD-401.9) the patient is significantly hypertensive.  We asked him to make recording of  his blood pressure.  We reviewed this and if appropriate adjustments in his antihypertensive medical regimen will be made. The following medications were removed from the medication list:    Avalide 300-12.5 Mg Tabs (Irbesartan-hydrochlorothiazide) .Marland Kitchen... 1 once daily His updated medication list for this problem includes:    Diovan Hct 320-12.5 Mg Tabs (Valsartan-hydrochlorothiazide) .Marland Kitchen... Take 1 tablet by mouth once a day    Aspirin Ec 325 Mg Tbec (Aspirin) .Marland Kitchen... Take 1 tablet by mouth once a day    Metoprolol Tartrate 25 Mg Tabs (Metoprolol tartrate) .Marland Kitchen... Take 1/2 tablet by mouth once daily  Other Orders: EKG w/ Interpretation (93000)  Patient Instructions: 1)  Your physician recommends that you continue on your current medications as directed. Please refer to the Current Medication list given to you today. 2)  Your physician wants you to follow-up in: 1 YEAR. You will receive a reminder letter in the mail about two months in advance. If you don't receive a letter, please call our office to schedule the follow-up appointment. 3)  Monitor blood pressures times 2 weeks and send results to our office or drop off at front desk before any med changes for blood pressure. Prescriptions: NITROSTAT 0.4 MG SUBL (NITROGLYCERIN) as needed chest pains  #25 x 2   Entered by:   Carlye Grippe   Authorized by:   Lewayne Bunting, MD, [facility]  Signed by:   Carlye Grippe on 09/25/2009   Method used:   Electronically to        [facility]  [id number] (retail)       [address]       [place], [place]  [address]       Ph: [phone] or [phone]       Fax: [phone]   RxID:   [id number]

## 2010-02-10 NOTE — Progress Notes (Signed)
Summary: Office Visit/ BLOOD PRESSURE READINGS  Office Visit/ BLOOD PRESSURE READINGS   Imported By: Dorise Hiss 10/13/2009 11:07:48  _____________________________________________________________________  External Attachment:    Type:   Image     Comment:   External Document  Appended Document: Office Visit/ BLOOD PRESSURE READINGS suggest to add amlodipine 5 mg p.o. q. daily for better blood pressure control  Appended Document: Office Visit/ BLOOD PRESSURE READINGS Left message to return call.   Appended Document: Office Visit/ BLOOD PRESSURE READINGS Patient notified.  Does not want to add another pill.  He questions if any of his meds could be changed to something different or anything consolidated.  Has to pay out alot every month for insurance and medications.  Appended Document: Office Visit/ BLOOD PRESSURE READINGS Would continue wiith current regimen then.   Appended Document: Office Visit/ BLOOD PRESSURE READINGS Patient notified via answering machine.

## 2010-02-10 NOTE — Cardiovascular Report (Signed)
Summary: Cardiac Catheterization  Cardiac Catheterization   Imported By: Dorise Hiss 09/23/2009 16:56:32  _____________________________________________________________________  External Attachment:    Type:   Image     Comment:   External Document

## 2010-02-12 NOTE — Letter (Signed)
Summary: [facility] D/C DR. Wende Crease  [facility] D/C DR. Wende Crease   Imported By: Zachary George 01/19/2010 11:05:01  _____________________________________________________________________  External Attachment:    Type:   Image     Comment:   External Document

## 2010-02-12 NOTE — Medication Information (Signed)
Summary: [facility] D/C MEDICATION ORDERS  [facility] D/C MEDICATION ORDERS   Imported By: Zachary George 01/19/2010 11:08:58  _____________________________________________________________________  External Attachment:    Type:   Image     Comment:   External Document

## 2010-02-12 NOTE — Consult Note (Signed)
Summary: [facility] -CONSULT-DR. TESFAYE  [facility] -CONSULT-DR. TESFAYE   Imported By: Zachary George 01/19/2010 11:04:31  _____________________________________________________________________  External Attachment:    Type:   Image     Comment:   External Document

## 2010-02-26 ENCOUNTER — Encounter (INDEPENDENT_AMBULATORY_CARE_PROVIDER_SITE_OTHER): Payer: Medicare Other | Admitting: Cardiology

## 2010-02-26 ENCOUNTER — Encounter: Payer: Self-pay | Admitting: Cardiology

## 2010-02-26 DIAGNOSIS — I10 Essential (primary) hypertension: Secondary | ICD-10-CM

## 2010-03-02 ENCOUNTER — Telehealth (INDEPENDENT_AMBULATORY_CARE_PROVIDER_SITE_OTHER): Payer: Self-pay | Admitting: *Deleted

## 2010-03-10 NOTE — Progress Notes (Signed)
Summary: REQUEST CLEARNACE FOR HERNIA REPAIR  Phone Note Call from Patient Call back at [phone]   Caller: Spouse Call For: nurse Summary of Call: message left on voicemail that patient need clearance for hernia repair that Degent informed him about at his last visit sent to [facility]. Initial call taken by: Carlye Grippe,  March 02, 2010 2:05 PM  Follow-up for Phone Call        Pt's wife called back to the office today. She states [facility] (Dr. Catalina Gravel) is requesting clearance from [place] before they will schedule an appt. Notified that message will be sent to Dr. Earnestine Leys regarding this request and she will be contacted as soon as he is able to review and respond to message. Pt's wife verbalized understanding. Follow-up by: Cyril Loosen, RN, BSN,  March 03, 2010 11:35 AM  Additional Follow-up for Phone Call Additional follow up Details #1::        patient can be cleared for surgery  see the addendum to this office visit note Additional Follow-up by: Lewayne Bunting, MD, [facility],  March 03, 2010 3:25 PM    Additional Follow-up for Phone Call Additional follow up Details #2::    Left message to notify wife that note w/clearance will be faxed to [facility]. Follow-up by: Cyril Loosen, RN, BSN,  March 03, 2010 3:56 PM

## 2010-03-10 NOTE — Assessment & Plan Note (Signed)
Summary: EPH D/C [facility] 12/29/09   Visit Type:  Follow-up Referring Provider:  Helene Kelp PA Primary Provider:  Rudi Heap, MD   History of Present Illness: the patient is a 70 and-year-old male with history of coronary artery disease, stent placement to the right coronary artery and residual mid 70% LAD disease.  The patient had a Cardiolite study in 2008 which was low risk.and the patient is unable to date is inhibitors or ARB secondary to angioedema.  In December he had a complex migraine headache presenting itself with dysphagia.   He had an echocardiogram done which showed an ejection fraction of 50 to 55%.  He had negative carotid Dopplers.  The patient also the nuclear scan done in 2010 which showed no ischemia.  Cardiac perspective he is doing well.  He denies any chest pain shortness of breath orthopnea PND.  He denies any palpitations.  Blood pressure is poorly controlled.  We plan to implement medication changes in the office visit today.  Preventive Screening-Counseling & Management  Alcohol-Tobacco     Smoking Status: quit     Year Quit: 1991  Current Medications (verified): 1)  Metformin Hcl 500 Mg Xr24h-Tab (Metformin Hcl) .... Take 2 Tablets By Mouth Two Times A Day 2)  Aspirin Ec 325 Mg Tbec (Aspirin) .... Take 1 Tablet By Mouth Once A Day 3)  Paroxetine Hcl 20 Mg Tabs (Paroxetine Hcl) .... Take 1 Tablet By Mouth Once A Day 4)  Metoprolol Tartrate 25 Mg Tabs (Metoprolol Tartrate) .... Take 1/2 Tablet By Mouth Once Daily 5)  Crestor 10 Mg Tabs (Rosuvastatin Calcium) .... Take 1 Tablet By Mouth Once A Day 6)  Nitrostat 0.4 Mg Subl (Nitroglycerin) .... As Needed Chest Pains 7)  Lantus Solostar 100 Unit/ml Soln (Insulin Glargine) .... Use As Directed 8)  Famotidine 20 Mg  Tabs (Famotidine) .Marland Kitchen.. 1 Two Times A Day 30 Mins Before Breakfast and Supper 9)  Novolog 100 Unit/ml Soln (Insulin Aspart) .... Sliding Scale Use As Directed 10)  Clonidine Hcl 0.1 Mg Tabs (Clonidine  Hcl) .... Take At 1 Am and 1 Pm 11)  Zyrtec Allergy 10 Mg Caps (Cetirizine Hcl) .... Take 1 Tablet By Mouth Once A Day 12)  Vitamin D 2000 Unit Tabs (Cholecalciferol) .... Take 1 Tablet By Mouth Once A Day  Allergies (verified): 1)  * Altace 2)  Doxycycline 3)  Percocet  Comments:  Nurse/Medical Assistant: The patient's medications and allergies were reviewed with the patient and were updated in the Medication and Allergy Lists. Tammi Romine CMA (February 26, 2010 11:41 AM)  Past History:  Past Medical History: Last updated: 09/25/2009 Diabetes Anxiety Disorder Arthritis HYPERTENSION, UNSPECIFIED (ICD-401.9) HYPERLIPIDEMIA-MIXED (ICD-272.4) CORONARY ATHEROSCLEROSIS NATIVE CORONARY ARTERY (ICD-414.01) status post stent placement to the right coronary artery residual mid 70% LAD disease Cardiolite study 2000 and a low risk.  Normal ejection fraction ABDOMINAL PAIN, UPPER, EPISODIC (ICD-789.09) COLONIC POLYPS, ADENOMATOUS, HX OF (ICD-V12.72) ADENOMYOMATOSIS OF GALLBLADDER (ICD-793.3) HEARTBURN (ICD-787.1) ABNORMAL TRANSAMINASE-LFT'S (ICD-790.4) ?ASBESTOSIS    - CT Chest 01/15/2009 [facility]) no evidence of asbestosis or asbestos related pleural dz  Past Surgical History: Last updated: 12/27/2007 PTCA-Stent Knee Replacement Rotator Cuff Repair  Family History: Last updated: 12/27/2007 Family History of Breast Cancer:Sister Family History of Colon Cancer:Mother (diagnosed age 56) Family History of Heart Disease: Father, Brother  Social History: Last updated: 01/16/2009 Occupation: Retired Married Patient is a former smoker. Quit Aug 1990.  Smoked for 25 yrs up to 1 ppd Alcohol Use - yes, very rare  Daily Caffeine Use Illicit Drug Use - no Patient does not get regular exercise.  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 [phone]  Risk Factors: Exercise: no (12/27/2007)  Risk Factors: Smoking Status: quit (02/26/2010)  Review of  Systems  The patient denies fatigue, malaise, fever, weight gain/loss, vision loss, decreased hearing, hoarseness, chest pain, palpitations, shortness of breath, prolonged cough, wheezing, sleep apnea, coughing up blood, abdominal pain, blood in stool, nausea, vomiting, diarrhea, heartburn, incontinence, blood in urine, muscle weakness, joint pain, leg swelling, rash, skin lesions, headache, fainting, dizziness, depression, anxiety, enlarged lymph nodes, easy bruising or bleeding, and environmental allergies.         dry mouth  Vital Signs:  Patient profile:   70 year old male Height:      66 inches Weight:      215 pounds BMI:     34.83 Pulse rate:   48 / minute BP sitting:   166 / 83  (left arm) Cuff size:   large  Vitals Entered By: Fuller Plan CMA (February 26, 2010 11:33 AM)   Physical Exam  Additional Exam:  General: Well-developed, well-nourished in no distress head: Normocephalic and atraumatic eyes PERRLA/EOMI intact, conjunctiva and lids normal nose: No deformity or lesions mouth normal dentition, normal posterior pharynx neck: Supple, no JVD.  No masses, thyromegaly or abnormal cervical nodes lungs: Normal breath sounds bilaterally without wheezing.  Normal percussion heart: regular rate and rhythm with normal S1 and S2, no S3 or S4.  PMI is normal.  No pathological murmurs abdomen: Normal bowel sounds, abdomen is soft and nontender without masses, organomegaly or hernias noted.  No hepatosplenomegaly musculoskeletal: Back normal, normal gait muscle strength and tone normal pulsus: Pulse is normal in all 4 extremities Extremities: No peripheral pitting edema neurologic: Alert and oriented x 3 skin: Intact without lesions or rashes cervical nodes: No significant adenopathy psychologic: Normal affect    Impression & Recommendations:  Problem # 1:  CORONARY ATHEROSCLEROSIS NATIVE CORONARY ARTERY (ICD-414.01) the patient is status post stent  placement.   He has no  recurrent chest pain.  Continue current medical therapy. The following medications were removed from the medication list:    Metoprolol Tartrate 25 Mg Tabs (Metoprolol tartrate) .Marland Kitchen... Take 1/2 tablet by mouth once daily His updated medication list for this problem includes:    Aspirin Ec 325 Mg Tbec (Aspirin) .Marland Kitchen... Take 1 tablet by mouth once a day    Nitrostat 0.4 Mg Subl (Nitroglycerin) .Marland Kitchen... As needed chest pains    Amlodipine Besylate 5 Mg Tabs (Amlodipine besylate) .Marland Kitchen... Take 1 tablet by mouth once a day  Problem # 2:  HYPERTENSION, UNSPECIFIED (ICD-401.9) blood pressure is poorly controlled.  We will make several medication changes.  Particular because the patient is complaining of a dry mouth and is unable to tolerate clonidine.  we will discontinue the Toprol as well as clonidine.  Amlodipine will be started at 5 mg a day, chlorthalidone 25 mg a day and Minipress at 1 mg p.o. b.i.d. The following medications were removed from the medication list:    Diovan Hct 320-12.5 Mg Tabs (Valsartan-hydrochlorothiazide) .Marland Kitchen... Take 1 tablet by mouth once a day    Metoprolol Tartrate 25 Mg Tabs (Metoprolol tartrate) .Marland Kitchen... Take 1/2 tablet by mouth once daily    Clonidine Hcl 0.1 Mg Tabs (Clonidine hcl) .Marland Kitchen... Take at 1 am and 1 pm His updated medication list for this problem includes:    Aspirin Ec 325 Mg Tbec (  Aspirin) .Marland Kitchen... Take 1 tablet by mouth once a day    Chlorthalidone 25 Mg Tabs (Chlorthalidone) .Marland Kitchen... Take 1 tablet by mouth once a day    Amlodipine Besylate 5 Mg Tabs (Amlodipine besylate) .Marland Kitchen... Take 1 tablet by mouth once a day    Prazosin Hcl 1 Mg Caps (Prazosin hcl) .Marland Kitchen... Take 1 tablet by mouth two times a day  Problem # 3:  HYPERLIPIDEMIA-MIXED (ICD-272.4) the patient will be changed to generic Lipitor 20+ p.o. daily His updated medication list for this problem includes:    Atorvastatin Calcium 20 Mg Tabs (Atorvastatin calcium) .Marland Kitchen... Take 1 tablet by mouth once a day at bedtime  Patient  Instructions: 1)  Your physician recommends that you schedule a follow-up appointment in: MAY 16TH @10 :00AM WITH DEGENT. 2)  Your physician has recommended you make the following change in your medication: STOP CRESTOR.STOP CLONIDINE. STOPE METOPROLOL. 3)  START ATORVASTATIN 20MG  AT BEDTIME. 4)  START CHLORTHALIDONE 25MG  DAILY. 5)  START AMLODIPINE 5MG  DAILY. 6)  START PRAZOSIN 1MG  ONE TWICE DAILY. 7)  Your prescriptions were sent to your pharmacy. Prescriptions: PRAZOSIN HCL 1 MG CAPS (PRAZOSIN HCL) Take 1 tablet by mouth two times a day  #60 x 3   Entered by:   Carlye Grippe   Authorized by:   Lewayne Bunting, MD, [facility]   Signed by:   Carlye Grippe on 02/26/2010   Method used:   Electronically to        [facility]  [address] [address] [id number] (retail)       [address]       [place], [place]  [address]       Ph: [phone] or [phone]       Fax: [phone]   RxID:   [id number] AMLODIPINE BESYLATE 5 MG TABS (AMLODIPINE BESYLATE) Take 1 tablet by mouth once a day  #30 x 3   Entered by:   Carlye Grippe   Authorized by:   Lewayne Bunting, MD, [facility]   Signed by:   Carlye Grippe on 02/26/2010   Method used:   Electronically to        [facility]  [address] [id number] (retail[facility]       [address]       [place], [place]  [address]       Ph: [phone] or [phone]       Fax: [phone]   RxID:   [id number] CHLORTHALIDONE 25 MG TABS (CHLORTHALIDONE) Take 1 tablet by mouth once a day  #30 x 3   Entered by:   Carlye Grippe   Authorized by:   Lewayne Bunting, MD, [facility]   Signed by:   Carlye Grippe on 02/26/2010   Method used:   [facility]ronically to        [facility]  [address] [id number]       [address]       [place], [place]  [address]       Ph: [phone] or [phone]       Fax: [phone]   RxID:   [id number] ATORVASTATIN CALCIUM 20 MG TABS (ATORVASTATIN CALCIUM) Take 1 tablet by mouth once a day at bedtime  #90 x 3    Entered by:   Carlye Grippe   Authorized by:   Lewayne Bunting, MD, [facility]  Signed by:   Carlye Grippe on 02/26/2010   Method used:   [facility]ronically [facility]        [facility]  [address] [id number]       [address]       [place], [place]  [address]       Ph: [phone] or [phone]       Fax: [phone]   RxID:   [id number]   Appended Document: EPH D/C [facility] 12/29/09 patient can be cleared for hernia surgery.  He is at low risk for cardiovascular perspective.  He had a nuclear perfusion study done in 2010 which was negative for ischemia and he reported no chest pain during his recent office visit his blood pressure is somewhat labile but should be under better control.  Appended Document: EPH D/C [facility] 12/29/09 Clearance faxed to Dr. Brayton Caves office.

## 2010-04-14 LAB — CBC
HCT: 33.3 % — ABNORMAL LOW (ref 39.0–52.0)
Hemoglobin: 11 g/dL — ABNORMAL LOW (ref 13.0–17.0)
Hemoglobin: 11.3 g/dL — ABNORMAL LOW (ref 13.0–17.0)
MCHC: 34 g/dL (ref 30.0–36.0)
RBC: 3.76 MIL/uL — ABNORMAL LOW (ref 4.22–5.81)
RBC: 3.86 MIL/uL — ABNORMAL LOW (ref 4.22–5.81)
RDW: 14.3 % (ref 11.5–15.5)
RDW: 14.5 % (ref 11.5–15.5)
RDW: 14.5 % (ref 11.5–15.5)

## 2010-04-14 LAB — GLUCOSE, CAPILLARY
Glucose-Capillary: 176 mg/dL — ABNORMAL HIGH (ref 70–99)
Glucose-Capillary: 211 mg/dL — ABNORMAL HIGH (ref 70–99)
Glucose-Capillary: 212 mg/dL — ABNORMAL HIGH (ref 70–99)
Glucose-Capillary: 223 mg/dL — ABNORMAL HIGH (ref 70–99)
Glucose-Capillary: 243 mg/dL — ABNORMAL HIGH (ref 70–99)
Glucose-Capillary: 245 mg/dL — ABNORMAL HIGH (ref 70–99)
Glucose-Capillary: 249 mg/dL — ABNORMAL HIGH (ref 70–99)
Glucose-Capillary: 273 mg/dL — ABNORMAL HIGH (ref 70–99)
Glucose-Capillary: 372 mg/dL — ABNORMAL HIGH (ref 70–99)

## 2010-04-14 LAB — ABO/RH: ABO/RH(D): O POS

## 2010-04-14 LAB — BASIC METABOLIC PANEL
CO2: 26 mEq/L (ref 19–32)
Calcium: 7.7 mg/dL — ABNORMAL LOW (ref 8.4–10.5)
Calcium: 8.3 mg/dL — ABNORMAL LOW (ref 8.4–10.5)
Creatinine, Ser: 0.88 mg/dL (ref 0.4–1.5)
GFR calc Af Amer: >60 mL/min (ref >60)
GFR calc Af Amer: >60 mL/min (ref >60)
GFR calc non Af Amer: >60 mL/min (ref >60)
Glucose, Bld: 254 mg/dL — ABNORMAL HIGH (ref 70–99)
Sodium: 133 mEq/L — ABNORMAL LOW (ref 135–145)

## 2010-04-14 LAB — PROTIME-INR
INR: 1.13 (ref 0.00–1.49)
INR: 1.63 — ABNORMAL HIGH (ref 0.00–1.49)
Prothrombin Time: 14.4 seconds (ref 11.6–15.2)

## 2010-04-15 LAB — COMPREHENSIVE METABOLIC PANEL
ALT: 18 U/L (ref 0–53)
AST: 18 U/L (ref 0–37)
Albumin: 3.6 g/dL (ref 3.5–5.2)
Alkaline Phosphatase: 57 U/L (ref 39–117)
Chloride: 105 mEq/L (ref 96–112)
Creatinine, Ser: 1.05 mg/dL (ref 0.4–1.5)
GFR calc Af Amer: >60 mL/min (ref >60)
Potassium: 4.6 mEq/L (ref 3.5–5.1)
Sodium: 139 mEq/L (ref 135–145)
Total Bilirubin: 0.7 mg/dL (ref 0.3–1.2)

## 2010-04-15 LAB — URINALYSIS, ROUTINE W REFLEX MICROSCOPIC
Glucose, UA: >1000 mg/dL — AB
Leukocytes, UA: NEGATIVE
Nitrite: NEGATIVE
Specific Gravity, Urine: 1.028 (ref 1.005–1.030)
pH: 5.5 (ref 5.0–8.0)

## 2010-04-15 LAB — APTT: aPTT: 25 seconds (ref 24–37)

## 2010-04-15 LAB — URINE MICROSCOPIC-ADD ON

## 2010-04-15 LAB — CBC
Platelets: 241 10*3/uL (ref 150–400)
WBC: 5.7 10*3/uL (ref 4.0–10.5)

## 2010-04-23 LAB — CBC
Hemoglobin: 16 g/dL (ref 13.0–17.0)
MCHC: 34.3 g/dL (ref 30.0–36.0)
MCV: 87.8 fL (ref 78.0–100.0)
RBC: 5.3 MIL/uL (ref 4.22–5.81)

## 2010-04-23 LAB — COMPREHENSIVE METABOLIC PANEL
ALT: 221 U/L — ABNORMAL HIGH (ref 0–53)
AST: 63 U/L — ABNORMAL HIGH (ref 0–37)
BUN: 22 mg/dL (ref 6–23)
CO2: 24 mEq/L (ref 19–32)
Calcium: 8.8 mg/dL (ref 8.4–10.5)
Calcium: 9.4 mg/dL (ref 8.4–10.5)
Creatinine, Ser: 0.99 mg/dL (ref 0.4–1.5)
Creatinine, Ser: 1.16 mg/dL (ref 0.4–1.5)
GFR calc Af Amer: >60 mL/min (ref >60)
GFR calc Af Amer: >60 mL/min (ref >60)
GFR calc non Af Amer: >60 mL/min (ref >60)
Glucose, Bld: 240 mg/dL — ABNORMAL HIGH (ref 70–99)
Sodium: 136 mEq/L (ref 135–145)
Total Protein: 6.6 g/dL (ref 6.0–8.3)

## 2010-04-23 LAB — URINE MICROSCOPIC-ADD ON

## 2010-04-23 LAB — LIPASE, BLOOD: Lipase: 74 U/L — ABNORMAL HIGH (ref 11–59)

## 2010-04-23 LAB — URINALYSIS, ROUTINE W REFLEX MICROSCOPIC
Glucose, UA: 250 mg/dL — AB
Protein, ur: NEGATIVE mg/dL

## 2010-04-23 LAB — DIFFERENTIAL
Lymphocytes Relative: 25 % (ref 12–46)
Lymphs Abs: 1.8 10*3/uL (ref 0.7–4.0)
Neutro Abs: 4.4 10*3/uL (ref 1.7–7.7)
Neutrophils Relative %: 61 % (ref 43–77)

## 2010-04-23 LAB — PROTIME-INR: Prothrombin Time: 12.4 seconds (ref 11.6–15.2)

## 2010-04-28 LAB — TSH: TSH: 0.985 u[IU]/mL (ref 0.350–4.500)

## 2010-04-28 LAB — DIFFERENTIAL
Basophils Absolute: 0 10*3/uL (ref 0.0–0.1)
Basophils Absolute: 0.1 10*3/uL (ref 0.0–0.1)
Eosinophils Relative: 2 % (ref 0–5)
Eosinophils Relative: 2 % (ref 0–5)
Lymphocytes Relative: 19 % (ref 12–46)
Lymphocytes Relative: 32 % (ref 12–46)
Lymphs Abs: 3 10*3/uL (ref 0.7–4.0)
Monocytes Absolute: 0.7 10*3/uL (ref 0.1–1.0)
Monocytes Absolute: 0.8 10*3/uL (ref 0.1–1.0)
Monocytes Relative: 8 % (ref 3–12)
Neutro Abs: 5.5 10*3/uL (ref 1.7–7.7)

## 2010-04-28 LAB — COMPREHENSIVE METABOLIC PANEL
AST: 17 U/L (ref 0–37)
Albumin: 3.2 g/dL — ABNORMAL LOW (ref 3.5–5.2)
BUN: 24 mg/dL — ABNORMAL HIGH (ref 6–23)
Calcium: 8.9 mg/dL (ref 8.4–10.5)
Creatinine, Ser: 1.08 mg/dL (ref 0.4–1.5)
GFR calc Af Amer: >60 mL/min (ref >60)
Total Protein: 5.9 g/dL — ABNORMAL LOW (ref 6.0–8.3)

## 2010-04-28 LAB — GLUCOSE, CAPILLARY
Glucose-Capillary: 132 mg/dL — ABNORMAL HIGH (ref 70–99)
Glucose-Capillary: 219 mg/dL — ABNORMAL HIGH (ref 70–99)

## 2010-04-28 LAB — POCT I-STAT, CHEM 8
BUN: 30 mg/dL — ABNORMAL HIGH (ref 6–23)
Calcium, Ion: 1.12 mmol/L (ref 1.12–1.32)
Chloride: 101 mEq/L (ref 96–112)
Creatinine, Ser: 1 mg/dL (ref 0.4–1.5)
Glucose, Bld: 157 mg/dL — ABNORMAL HIGH (ref 70–99)
TCO2: 26 mmol/L (ref 0–100)

## 2010-04-28 LAB — LIPID PANEL
LDL Cholesterol: 58 mg/dL (ref 0–99)
Total CHOL/HDL Ratio: 4.1 RATIO
VLDL: 19 mg/dL (ref 0–40)

## 2010-04-28 LAB — SEDIMENTATION RATE: Sed Rate: 5 mm/hr (ref 0–16)

## 2010-04-28 LAB — CBC
HCT: 40.9 % (ref 39.0–52.0)
HCT: 44 % (ref 39.0–52.0)
Hemoglobin: 15 g/dL (ref 13.0–17.0)
MCHC: 34.4 g/dL (ref 30.0–36.0)
MCV: 87.5 fL (ref 78.0–100.0)
Platelets: 251 10*3/uL (ref 150–400)
RBC: 5.03 MIL/uL (ref 4.22–5.81)
RDW: 13 % (ref 11.5–15.5)
RDW: 13.7 % (ref 11.5–15.5)
WBC: 9.6 10*3/uL (ref 4.0–10.5)

## 2010-05-20 ENCOUNTER — Other Ambulatory Visit: Payer: Self-pay | Admitting: *Deleted

## 2010-05-20 MED ORDER — AMLODIPINE BESYLATE 5 MG PO TABS: 5.0000 mg | ORAL_TABLET | Freq: Every day | ORAL | Status: DC

## 2010-05-20 MED ORDER — PRAZOSIN HCL 1 MG PO CAPS: 1.0000 mg | ORAL_CAPSULE | Freq: Two times a day (BID) | ORAL | Status: DC

## 2010-05-26 NOTE — Assessment & Plan Note (Signed)
[facility]                          [facility] OFFICE NOTE   NAME:[name], [name] SCHAUER                   MRN:          [id number]  DATE:08/05/2006                            DOB:          [birth year]    HISTORY OF PRESENT ILLNESS:  The patient is 70 year old male patient  with coronary artery disease status post stent placement to the RCA, and  residual 70% mid LAD.  The patient reported atypical chest pain to  [facility].  He was set up for a Cardiolite study.  This showed non-definite  ischemic with an ejection fraction of 50%.  The patient stated that he  is doing extremely well, and has no symptoms.   MEDICATIONS:  1. Diovan 12.5 daily.  2. Metformin ER 500 mg 4 tablets daily.  3. Enteric coated aspirin 325 daily.  4. Paroxetine 20 mg p.o. daily.  5. Metoprolol 25 mg 1/2 tablet p.o. b.i.d.  6. Osteo Bi-Flex.  7. Hydroxyzine.  8. Crestor 10 mg a day.  9. Lantus insulin 26 units.  10.Celebrex 200 mg p.o. daily.   PHYSICAL EXAMINATION:  VITAL SIGNS:  Blood pressure 128/64.  Heart rate  60.  Weight is 206.  NECK EXAM:  Normal carotid upstroke.  No carotid bruits.  LUNGS:  Clear.  HEART:  Regular rate and rhythm.  ABDOMEN:  Soft.  EXTREMITY EXAM:  No edema.   PROBLEM LIST:  1. Atypical chest pain.  2. Coronary artery disease.      a.     Status post non-ST elevation myocardial infarction December       2006 status post bare-metal stent to the proximal right coronary       artery.      b.     Residual coronary artery disease with 70% mid LAD.      c.     Nonischemic Cardiolite July 2008.      d.     Preserved left ventricular function.  3. Dyslipidemia.  4. Insulin dependent diabetes mellitus.  5. Hypertension.  6. Ex-smoker.   PLAN:  1. The patient is doing extremely well.  We reviewed his Cardiolite      study.  The study was low      risk, and actually was essentially within normal limits.  2. Continue medical therapy.  I have made  changes.  3. The patient can follow up with [place] in the near future.     Learta Codding, MD,FACC  Electronically Signed    GED/MedQ  DD: 08/05/2006  DT: 08/06/2006  Job #: [id number]

## 2010-05-26 NOTE — Op Note (Signed)
[name], [name]            ACCOUNT NO.:  [id number]   MEDICAL RECORD NO.:  [id number]          PATIENT TYPE:  AMB   LOCATION:  ENDO                         FACILITY:  [facility]   PHYSICIAN:  John C. Madilyn Fireman, M.D.    DATE OF BIRTH:  [birth year]   DATE OF PROCEDURE:  DATE OF DISCHARGE:                               OPERATIVE REPORT   PROCEDURE:  Endoscopic retrograde cholangiopancreatography with  sphincterotomy.   INDICATIONS FOR THE PROCEDURE:  The patient with abdominal pain, dilated  gallbladder and possibly intrahepatic duct on CT scan and abdominal  ultrasound with sludge noted in the gallbladder, overall suspicion of  possible common bile duct stones or small ampullary, pancreatic, or  common bile duct stricture or tumor.   PROCEDURE IN DETAIL:  The patient was placed in the prone position and  placed on the pulse monitor with continuous low-flow oxygen delivered by  nasal cannula.  He was sedated with 150 mcg IV fentanyl, 15 mg IV  Versed, and 0.5 mg IV glucagon.  The Olympus side-viewing endoscope was  advanced blindly into the oropharynx, esophagus, stomach.  The pylorus  was traversed and papilla of Vater located on the medial duodenal wall.  There were 2 surrounding diverticuli, but otherwise it had a normal  appearance.  It was cannulated with a Wilson-Cook sphincterotome and a  cholangiogram was obtained.  No pancreatic injections were done.  The  common bile duct appeared normal throughout its length as did the common  hepatic duct and intrahepatic ducts that were seen.  The gallbladder  filled and I did not see any definite stones.  I did not see any  definite filling defects or stricture or any significant dilatation.  A  guidewire was anchored into the common hepatic duct and a large  sphincterotomy was performed.  An adjustable 15-mm balloon catheter was  then advanced up into the common hepatic duct, inflated, and dragged  down several times with no stones seemed  to be delivered, clear amber  bile exiting the ampulla.  After several sweeps were made and occlusion  cholangiogram, the balloon inflated at the distal end of the duct was  made and again no filling defects were seen.  The scope was then  withdrawn and the patient returned to the recovery room in stable  condition.  He tolerated the procedure well and there were no immediate  complications.   IMPRESSION:  Currently, no evidence for common bile duct stone.   PLAN:  We will follow clinically and monitor liver function tests, which  have fallen in the last 3 days.  If they remain normal, I will consider  expectant management alone, but also consider HIDA scan or  cholecystectomy particularly if he has any more symptoms compatible with  biliary colic.           ______________________________  Everardo All. Madilyn Fireman, M.D.     [facility]/MEDQ  D:  03/14/2008  T:  03/15/2008  Job:  [id number]   cc:   Ernestina Penna, M.D.

## 2010-05-26 NOTE — Assessment & Plan Note (Signed)
[facility]                          [facility] OFFICE NOTE   MAYKEL, REITTER                   MRN:          [id number]  DATE:08/15/2008                            DOB:          July 12, 1940    REFERRING PHYSICIAN:  Ernestina Penna, M.D.   HISTORY OF PRESENT ILLNESS:  The patient is a 70 year old male with the  history of coronary artery disease, status post stent placement to the  right coronary artery and residual mid 70% LAD disease.  The patient  denies currently any substernal chest pain, shortness of breath,  orthopnea, or PND.  A Cardiolite study in 2008 was low risk.  His  ejection fraction is normal.  He reports no heart failure symptoms.  The  patient states that he has a bad hip and is planning on hip surgery in  December.  He also a week ago was at [facility], when he developed  sudden onset of shortness of breath and itching and a total body rash  with hives.  He had to go to the emergency room and got Benadryl and  prednisone.  He states this happened a couple of times over the last few  years.  He reports up of possible allergic reaction, although he does  not know the causative agent.  Of note, also the patient had some  elevated liver function test a year ago with a markedly abnormal gamma  GT.  I did review his most recent liver function test and it appear to  be within normal limits.  The patient otherwise remains very active and  has no functional limitations.   MEDICATIONS:  1. Metformin ER 500 mg 4 times a day.  2. Enteric-coated aspirin 325 daily.  3. Paroxetine 20 mg p.o. daily.  4. Metoprolol 25 mg half a tablet p.o. b.i.d.  5. Osteo Bi-Flex.  6. Crestor 10 mg p.o. daily.  7. Lantus insulin 26 units nightly.  8. Celebrex 200 mg p.o. daily.  9. Trilipix 100 mg p.o. daily.  10.Verapamil 120 mg p.o. b.i.d.  11.Famotidine 20 mg p.o. b.i.d.   PHYSICAL EXAMINATION:  VITAL SIGNS:  Blood pressure 155/81, but  only  130/70 on retake, heart rate 66, weight 199 pounds.  GENERAL:  Well-nourished, white male, in no apparent distress.  HEENT:  Pupils, eyes clear; conjunctivae clear.  NECK:  Supple.  Normal carotid upstroke.  No carotid bruits.  LUNGS:  Clear breath sounds bilaterally.  HEART:  Regular rate and rhythm with normal S1 and S2.  No murmurs,  rubs, or gallops.  ABDOMEN:  Soft, nontender.  No rebound or guarding.  Good bowel sounds.  EXTREMITIES:  No cyanosis, clubbing, or edema.  NEURO:  The patient is alert, oriented, and grossly nonfocal.   PROBLEM LIST:  1. Coronary artery disease.      a.     Status post non-ST-elevation myocardial function, December       2006.      b.     Status post bare-metal stent to the proximal right coronary       artery.  c.     Residual coronary artery disease 70% mid left anterior       descending artery.      d.     Nonischemic Cardiolite, July 2008.      e.     Preserved left ventricular function.  2. Dyslipidemia.      a.     Not at goal with low-density lipoprotein.  3. Insulin-dependent diabetes mellitus.  4. Hypertension.  5. Ex-smoker.   PLAN:  1. I reviewed the patient's lipomed profile and lipid panel.  He is      not at goal with his LDL, and I would suggest to Dr. Christell Constant to      increase Crestor to 10 mg p.o. daily.  Although, the patient has      elevated triglycerides, there is no evidence that lowering of      triglycerides will actually to lower mortality has a matter of fact      given his history of hepatitis a year ago.  I would be very      reluctant to use the combination of Crestor with Trilipix.  2. The allergic reaction is of unclear etiology, but certainly      Trilipix could also be implicated, although I do not think that      this is the case.  I told the patient if he has recurrent problems      to follow up with Dr. Christell Constant and possibly consider allergy testing.  3. The patient will have a stress test in 6 months as  he is planning      on right hip surgery at that time.  The patient is only due for a      stress test in another year, but it would be reasonable to do this      a little bit earlier prior to surgery.  The patient will call us      back as went to schedule the stress test.      Learta Codding, MD,FACC  Electronically Signed    GED/MedQ  DD: 08/15/2008  DT: 08/15/2008  Job #: [id number]   cc:   Ernestina Penna, M.D.

## 2010-05-26 NOTE — Assessment & Plan Note (Signed)
[facility]                          [facility] OFF[facility] NOTE   NAME:Glennie, TYDEN KANN                   MRN:          [id number]  DATE:07/20/2006                            DOB:          01-27-1940    CARDIOLOGIST:  Dr. Lewayne Bunting.   PRIMARY CARE Lonnell Chaput:  Lindaann Pascal PA-C at [facility].   HISTORY OF PRESENT ILLNESS:  Mr. Mcgillicuddy is a 70 year old male patient  followed by Dr. Andee Lineman with a history of non-ST elevation myocardial  infarction in December 2006.  His RCA was totally occluded at cardiac  catheterization.  This was stented with a MiniVision bare metal stent.  Following stenting he had a 50% stenosis noted downstream in the RCA.  At cardiac catheterization prior to intervention his residual disease  included 40-50% proximal LAD disease, mid 70% stenosis, and mid to  distal 50% stenosis.  The first diagonal was sub totally occluded which  was old; circumflex proximal 30% and 30-40% prior to the takeoff of the  PDA; PDA with luminal irregularities at 30%; ramus ostial 60-70%  unchanged from previous.  Obtuse marginal #1 50%.  His EF was 50% with  no wall motion abnormalities.  He was last seen in this office in  February 2008.  At that time he was doing well.  Gene Serpe PA-C saw him  and decided to change his Lipitor to Crestor for better management of  his low HDL levels.  His Plavix was discontinued as it had been over a  year since his myocardial infarction.  Since the patient had his  myocardial infarction in 2006, he did have a followup stress test in  June 2007.  His EF was 54%, and there was no significant anterior or  inferior ischemia.  The study was relatively low risk.   As noted above he returns today for routine followup.  He denies any  exertional chest heaviness or tightness or shortness of breath.  Denies  any syncope or near syncope.  Denies any palpitations.  He does,  however, note  occasional fleeting chest pain. He is unsure whether or  not to worry about this.  He is able to exert himself most of the time  without any symptoms.  He denies orthopnea, paroxysmal nocturnal  dyspnea, palpitations or pedal edema.  Denies any syncope or near  syncope.   CURRENT MEDICATIONS:  1. Diovan 320/12.5 mg daily.  2. Metformin ER 500 mg four tablets daily.  3. Enteric-coated aspirin 325 mg daily.  4. Paroxetine 20 mg daily.  5. Metoprolol 25 mg 1/2 tablet b.i.d.  6. Osteo-Bi-Flex.  7. Hydroxyzine.  8. Crestor 10 mg q.h.s.  9. Lantus 26 units daily.  10.Celebrex 200 mg daily.  11.Hydroxyzine p.r.n.  12.Imitrex p.r.n.  13.Nitroglycerin p.r.n.  14.Tylenol p.r.n.   ALLERGIES:  No known drug allergies.   PHYSICAL EXAMINATION:  GENERAL:  He is a well-nourished, well-developed  male in no distress.  VITAL SIGNS:  Blood pressure 136/79, pulse 53.  HEENT:  Normal.  NECK:  Without JVD.  Carotids without bruits bilaterally.  CARDIAC:  S1, S2, regular  rate and rhythm without murmurs.  LUNGS:  Clear to auscultation bilaterally without wheezing, rhonchi or  rales.  ABDOMEN:  Soft, nontender with normoactive bowel sounds, no  organomegaly.  EXTREMITIES:  Without edema.  Calves soft, nontender.  SKIN:  Warm and dry.  NEUROLOGIC:  He is alert and oriented x3.  Cranial nerves II-XII are  grossly intact.   Electrocardiogram reveals sinus rhythm with heart rate 51, no acute  changes.   IMPRESSION:  1. Atypical chest pain.  2. Coronary artery disease.      a.     Status post non-ST elevation myocardial infarction December       2006 treated with a bare metal stent to the proximal RCA.      b.     Residual coronary artery disease as noted above:  70% mid       LAD.      c.     Nonischemic Cardiolite June 2007.  3. Preserved LV function.  4. Treated dyslipidemia.  5. Insulin-dependent diabetes mellitus.  6. Hypertension.  7. Ex-smoker.   PLAN:  The patient presents to the  office today for routine followup.  Overall he is doing well without any symptoms concerning for exertional  angina pectoris.  However, he does describe occasional fleeting chest  pains.  In looking through his history his initial cardiac  catheterization was done in response to an abnormal Cardiolite study  that was done to clear him for surgery.  Several months after this he  suffered non-ST elevation myocardial infarction, and he does have some  scattered disease in the LAD.  It has been over a year since his last  Cardiolite study.  At this point in time I have recommended proceeding  with repeat adenosine Cardiolite study.  He is in agreement with this  and is actually eager to do a followup study.  He will continue on all  the medications as listed above.  He will continue followup with Scott  Long PA-C.  He will need  followup lipids and LFT's in September.  Will bring him back in six  months or sooner if his stress test is abnormal.      Tereso Newcomer, PA-C  Electronically Signed      Learta Codding, MD,FACC  Electronically Signed   SW/MedQ  DD: 07/20/2006  DT: 07/21/2006  Job #: [id number]   cc:   Lindaann Pascal, PA-C

## 2010-05-26 NOTE — Consult Note (Signed)
NAMEORVEL, CUTSFORTH            ACCOUNT NO.:  [id number]   MEDICAL RECORD NO.:  [id number]          PATIENT TYPE:  INP   LOCATION:  1523                         FACILITY:  [facility]   PHYSICIAN:  Deanna Artis. Hickling, M.D.DATE OF BIRTH:  12/03/1940   DATE OF CONSULTATION:  02/23/2008  DATE OF DISCHARGE:                                 CONSULTATION   CHIEF COMPLAINT:  Headaches and altered mental status.   HISTORY OF THE PRESENT CONDITION:  [name] is a 70 year old  gentleman seen at [facility] with a 30-year history  of migraines.  The patient has been seen by Dr. Benson Setting of [place].  He has had workup for his migraines, including CT scan of the  brain, but has never had an MRI.  Indeed, I have records from July 2009,  where he had two CT scans of the brain within a 4-day history because of  his headaches.  He had another CT scan of the brain yesterday when he  presented with altered mental status after taking 3 Xanax in a failed  attempt to perform an MRI scan.   Of note is that the patient was seen by Dr. Terrace Arabia and was treated with 4 L  of oxygen via nasal cannula.  This substantially resolved his headache.  He was also given some IV Toradol.   The patient's wife became concerned when he had significant altered  mental status with the Xanax.  His headache was returning and was quite  severe, and she did not know what to do.   The patient was advised to come the hospital at [facility], but instead  came to [facility], where he was kindly admitted by the  [facility].   The patient's headache is described as severe, burning, involving the  region of his forehead and the orbits.  He feels that the pain is deep  inside.  Simultaneously, he has a deep aching pain at the base of his  neck.  Symptoms can last for hours, but tend to be intermittent.  Over  the past 9 days, he has had a nearly continuous cluster of headaches.   He  says that this is a very typical pattern for him.  He can go months  without any headaches at all, and then have a bad run of headaches, as  he had this time.   In the hospital this morning, he is not complaining of headache, and  feels as well as he has in the past 9 or 10 days.  I was asked to see  him to evaluate his altered mental status (which is gone with the  metabolism of Xanax), and also to give recommendations for treatment of  his headaches.   PAST MEDICAL HISTORY:  1. Type 2 diabetes mellitus.  2. Hypertension.  3. Dyslipidemia.  4. Obesity.  5. Possible rosacea.  6. History of adenomatous polyps.  7. Kidney stones.  8. Osteoarthritis of his knees, which required total knee replacement.  9. Injury to his right rotator cuff, which required surgery in the  past.   CURRENT MEDICATIONS:  1. Aspirin 325 mg a day.  2. Metoprolol 12.5 mg twice daily.  3. Trilipix 135 mg daily.  4. Januvia 100 mg daily.  5. Crestor 10 mg daily.  6. Pepcid 20 mg twice daily.  7. He is also on subcutaneous heparin for deep vein thrombosis      prophylaxis.  8. He is also on Avalide 300/12.5 one daily.  9. Metformin 1000 mg twice daily.  10.It also appears that he has been on famotidine, Celebrex,      hydroxyzine, Imitrex, Lantus insulin.  (He is on what appears to be      sliding-scale insulin plus 30 units of Lantus at bedtime.)   In addition to his other problems, it appears that he may have had a  prior myocardial infarction, but is not currently complaining of chest  pain.   PAST SURGICAL HISTORY:  1. As noted above.  2. Bilateral total knee replacements.  3. Right rotator cuff surgery.   SOCIAL HISTORY:  The patient does not use tobacco, alcohol, or drugs.  He is retired and lives with his spouse.   REVIEW OF SYSTEMS:  Negative, except as noted above in the history of  the present illness.  A 12-system review was evaluated.   FAMILY HISTORY:  Noncontributory for this  problem.   PHYSICAL EXAMINATION TODAY:  GENERAL:  This is a pleasant gentleman, in  no acute distress.  VITAL SIGNS:  Temperature 97.5, blood pressure 136/67, resting pulse 56,  respirations 18, oxygen saturation 94% on room air, weight 86.2 kg,  height 66 inches.  ENT:  He has a malar rash.  There is no infection.  NECK:  Supple.  No bruits.  LUNGS:  Clear.  HEART:  No murmurs.  Pulses normal.  ABDOMEN:  Protuberant.  Bowel sounds.  No hepatosplenomegaly.  EXTREMITIES:  Bilateral knee replacement scars.  Also, scar on his right  shoulder.  His feet are pink, warm.  There is no edema.  He has good  capillary refill.  NEUROLOGIC:  Awake, alert.  He is hard of hearing.  He is oriented.  He  has normal language.  Cranial nerves:  Round, reactive pupils.  Visual  fields full to double simultaneous stimuli.  Extraocular movements full.  He squints with his right eye when he looks to the left.  He denies  diplopia.  He has symmetric facial strength, midline tongue, air  conduction greater than bone conduction on the left.  He has no hearing  on the right.  He has normal strength, limited in his hands only by  osteoarthritis.  No pronator drift.  Fine motor movements were normal.  Gait was slightly broad-based but steady, since he has a sensory  peripheral polyneuropathy and good stereoagnosis.  Cerebellar  examination:  Finger-to-nose, heel-knee-shin were okay.  Heel-knee-shin  was affected by his knee replacements.  He is areflexic.  He has  bilateral flexor plantar responses.   IMPRESSION:  Cluster migraine headaches, 346.20.   RECOMMENDATIONS:  The patient should be sent home on home oxygen, and  should receive 4 liters of oxygen at the onset of his headaches.  He  should also take indomethacin 25 mg 3 times daily with meals.  I have  contacted the hospitalist, Dr. Kathryne Hitch, and also [facility],  as well as discussed this with the patient and his wife.  He may need to  pay  for the home oxygen, as this condition may not  be covered by  Medicare rules.  The patient does not have COPD, but this is a  recognized treatment, not experimental.  It has been used for at least  the last 20 or 30 years.   I appreciate the opportunity to participate in his care.  I believe that  he can go home today.  He should follow up with my partner, Dr. Terrace Arabia, at  a time agreed upon by them.  [facility] will need to  arrange an MRI scan under conscious sedation if it is decided that we  should continue to pursue this.      Deanna Artis. Sharene Skeans, M.D.  Electronically Signed     WH[facility]/MEDQ  D:  02/23/2008  T:  02/23/2008  Job:  [id number]   cc:   Lindaann Pascal, P.A.  [facility]   Levert Feinstein, MD

## 2010-05-26 NOTE — Discharge Summary (Signed)
NAMEJEANNE, TERRANCE            ACCOUNT NO.:  [id number]   MEDICAL RECORD NO.:  [id number]          PATIENT TYPE:  OBV   LOCATION:  1523                         FACILITY:  [facility]   PHYSICIAN:  Monte Fantasia, MD  DATE OF BIRTH:  1940/01/19   DATE OF ADMISSION:  02/23/2008  DATE OF DISCHARGE:                               DISCHARGE SUMMARY   DISCHARGE DIAGNOSES:  1. Cluster headaches.  2. Hypertension.  3. Coronary artery disease.  4. Type 2 diabetes on insulin.  5. History of urinary tract infections.  6. History of renal calculi.  7. Hyperlipidemia.  8. History of edematous colon polyps.  9. Migraine headaches.   DISCHARGE MEDICATIONS:  1. Aspirin 325 mg p.o. daily.  2. Oxygen 4 liters via nasal cannula p.r.n. cluster headaches.  3. Avalide 300/12.5 mg 1 tablet p.o. daily.  4. Metformin ER 500 mg 2 tablets, that is total of 1 gm dose p.o.      b.i.d.  5. Paroxetine 20 mg p.o. daily at 6 p.m.  6. Metoprolol 12.5 mg p.o. b.i.d.  7. Trilipix 135 mg 1 tablet p.o. daily.  8. Januvia 100 mg p.o. daily.  9. Crestor 10 mg p.o. daily.  10.Prevacid 20 mg p.o. b.i.d.  11.Lantus 30 units subcutaneous q.h.s.  12.NitroQuick 0.4 mg p.r.n. chest pain.  13.Celebrex 200 mg 1 tablet p.o. daily p.r.n. pain.  14.Indomethacin 25 mg p.o. t.i.d.   HOSPITAL COURSE:  A 70 year old gentleman patient was admitted to the ER  with complaints of severe headache and with altered mental status on  February 23, 2008.  The patient apparently has longstanding history of  migraine headaches and had been taking Imitrex p.r.n.  The patient was  admitted as the patient was complaining of on and off headaches for the  past 2-3 days.  The patient was evaluated by [facility] before  and was scheduled to have an MRI.  However the patient even with Xanax,  the patient was very claustrophobic and was unable to get an MRI done.  The patient was evaluated by Neurology, Dr. Sharene Skeans for the same.  As  per Dr. Sharene Skeans, the patient has been prior treated by Dr. Terrace Arabia with 4  liters of oxygen via nasal cannula which subsides his headache  as the  patient has cluster headaches.  On admission, the patient's headache was  described as severe burning involving the region of his forehead and  orbits which were deep inside, simultaneously aching at the base of the  neck.  As per the patient, this was a very typical pattern and sometimes  he can go without having headaches for months at all.  As per the  Neurology evaluation, the impression was cluster migraine headaches and  recommended to be sent home on home oxygen with 4 liters at the onset of  the headaches.  Also was started on indomethacin 25 mg 3 times a day  with meals.  [facility] has been also arranged for the home  oxygen.  The patient needs to follow up with Dr. Terrace Arabia at [facility].  The patient will need to  arrange an MRI under  conscious sedation if decided that it would be pursued further.   DIAGNOSTICS:  CT scan of the head done on February 22, 2008 impression:  Mild atrophy with small vessel disease.  No acute abnormality.   LABORATORY DATA:  Labs done during the stay in the hospital:  Total WBC  9.6, hemoglobin 14.1, hematocrit 40.9, platelets of 251, ESR of 5,  sodium 138, potassium 3.0, chloride 101, bicarb 29, glucose 124, BUN 24,  creatinine 1.08, total bilirubin 1.0, alkaline phosphatase 53, AST 17,  ALT 17, total protein 5.9, albumin 3.2, calcium 8.9, hemoglobin A1c 8.1,  total cholesterol 102, triglycerides 95, cholesterol HDL 25, LDL 58,  VLDL 19.   DISPOSITION:  The patient is stable to be discharged as per Neurology  recommendations.   RECOMMENDATIONS:  Dr. Sharene Skeans recommended to follow up with Dr. Terrace Arabia  with [facility] and the patient may need an MRI of  the brain under conscious sedation as an outpatient.  [facility] need to arrange for  the same if decided to  pursue further.  The patient at present is medically stable and can be  discharged home.  The patient is being discharged on home oxygen 4  liters per minute to be taken at the starting of the headache and also  to continue on indomethacin 25 mg p.o. t.i.d. for the same.   We highly appreciate the consult by Dr. Sharene Skeans.  Thanks for the same.      Monte Fantasia, MD  Electronically Signed     MP/MEDQ  D:  02/23/2008  T:  02/23/2008  Job:  [id number]   cc:   Deanna Artis. Sharene Skeans, M.D.  Fax: [phone]   Levert Feinstein, MD

## 2010-05-26 NOTE — H&P (Signed)
[name], [name]            ACCOUNT NO.:  [id number]   MEDICAL RECORD NO.:  [id number]          PATIENT TYPE:  EMS   LOCATION:  ED                           FACILITY:  [facility]   PHYSICIAN:  Oswald Hillock, MD        DATE OF BIRTH:  December 24, 1940   DATE OF ADMISSION:  02/22/2008  DATE OF DISCHARGE:                              HISTORY & PHYSICAL   CHIEF COMPLAINT:  Headache, altered mental status.   HISTORY OF PRESENT ILLNESS:  The patient is a 70 year old Caucasian male  who was seen for evaluation at the request of the neurologist, Dr.  Pearlean Brownie, after he was advised to come to the ER by his neurologist for  evaluation of his headaches.  Apparently the patient has a longstanding  history of migraine headaches and has been using Imitrex on a p.r.n.  basis but about 2 weeks back his headaches had worsened and become more  regular in frequency.  He has had multiple visits to the emergency room  at his local city, i.e., [place], and has been given multiple medications  including morphine, Toradol, and was evaluated by [facility]  about 2 days back, after which he was scheduled to have an MRI  yesterday.  Because of his claustrophobia, he got 3 doses of Xanax,  however, the MRI could not be done because of his inability to stay  still and the patient was sent back home.  His headaches worsened.  He  had an episode of vomiting but no reports of any fever, rigors, chills,  photophobia, blurring of vision, loss of consciousness or any focal  weakness of any part of the body.  The patient was, however, noted to be  irritable and not his usual self by the wife.  He had an initial  evaluation done in the ER which included a CT scan that did not show any  significant findings.  His chemistries and CBC were within normal  limits, as was an ESR done in the ER.  The patient received a dose of  Toradol and Reglan in the ER and at the time of this interview the  patient was initially sleeping  and upon being woken, he states that his  headache is almost completely resolved.  He is alert, oriented, denies  any photophobia or blurring of vision, chest pain, shortness of breath,  palpitations, dizziness, loss of consciousness or any focal weakness of  any part of the body.   PAST MEDICAL HISTORY:  Significant for  1. Hypertension.  2. Coronary artery disease.  3. Type 2 diabetes mellitus, on insulin.  4. History of urinary tract infection.  5. History of renal calculi.  6. History of dyslipidemia.  7. History of adenomatous colon polyps.  8. History of migraine headaches.   SOCIAL HISTORY:  The patient is married, retired, works in [employer], political party,  and drinks about 3 beers over a 2-week period.  No history of tobacco  use.   FAMILY HISTORY:  Father with coronary artery disease.  Brother with a  history of heart disease.  Mother had history of colon cancer,  and  arthritis runs in the family.   ALLERGIES:  DOXYCYCLINE.   CURRENT MEDICATIONS:  Per available records, the patient is currently  on:  1. Metformin 1000 mg twice a day.  2. Avalide 300/12.5 once daily.  3. Aspirin 325 mg once daily.  4. Paxil 20 mg once daily.  5. Metoprolol 12.5 mg twice a day.  6. Trilipex 135 grams daily.  7. Januvia 100 mg once daily.  8. Hydroxyzine 25 mg as needed.  9. Imitrex 50 mg as needed.  10.Crestor 10 mg once a day.  11.Nitrostat sublingual 0.4 mg as needed.  12.Lantus subcu 20 units at bedtime.  13.Celebrex 200 mg once a day.  14.Famotidine 20 mg twice a day.   REVIEW OF SYSTEMS:  An extensive systems was done.  All systems are  negative except for the positives mentioned in the history of present  illness.   PHYSICAL EXAMINATION:  VITALS:  Pulse on admission 66, blood pressure  129/72, respiratory rate of 20, temperature 97.3.  GENERAL:  An elderly Caucasian male in no acute distress, sleeping at  the time of this interview but easily aroused and upon waking up, alert  and  oriented to time, place and person.  HEENT:  No scleral icterus.  No pallor.  Ears negative.  Pupils equal  and reactive to light and accommodation.  Extraocular movements intact.  Fundus examination:  Fundus could not be well visualized.  NECK:  Supple.  No lymphadenopathy.  No JVD.  No carotid bruits.  CHEST:  CTA bilaterally.  CVS:  S1, S2 plus regular.  No gallop, rub or murmur appreciated.  ABDOMEN:  Soft, nontender, nondistended.  Bowel sounds present.  EXTREMITIES:  No cyanosis, clubbing or edema.  NEUROLOGIC EXAMINATION:  Nerves II-XII appear grossly intact.  Motor  examination:  Power grade 5/5.  All over reflexes are present.  Plantars:  The patient withdraws.  Kernig's sign is negative.   LABORATORY DATA:  His ESR was 5.  WBC 8.7, hemoglobin 15, hematocrit 44,  platelet count of 301.  Sodium 136, potassium 3.3, chloride 101, CO2 30,  creatinine 1.0, glucose 157, ionized calcium of 1.12.  His CT scan of  the chest showed mild atrophy with small vessel disease.  No acute  abnormality.   IMPRESSION AND PLAN:  This is a case of 70 year old Caucasian male with  a history of hypertension, coronary artery disease, diabetes mellitus  and migraine headaches who presents with a headache and altered mental  status.  1. Headache.  Based on his history and the fact that he has responded      well to the use of Toradol, it is clear that the patient has a      recurrence of his migraines.  The presentation, though, is fairly      atypical and there may be a question of these being cluster      headaches, however, the treatment essentially remains the same.  He      may need long-term prophylaxis with some atypical agent like      Topamax, however, will defer that to the neurology service.  They      have already seen him in the clinic and if they deem it necessary,      an MRI can be repeated in the morning.  I do not see any reason to      do a lumbar puncture at this point as the  patient is afebrile, has  no photophobia, and these symptoms have been present for more than      2 weeks now.  He has a normal white count and no signs consistent      with any infective neurologic process.  2. His altered mental status is likely due to medications.  The      patient was given multiple doses of Xanax and has been taking      narcotics, including morphine and codeine derivatives, over the      last few days.  At the time of this interview, the patient is      alert, oriented to time, place and person and has no change in his      neurologic status.  We will observe him overnight and reevaluate      him in the morning and make further recommendations based on that.      Will hold off on any narcotics for now and use only nonsteroidals,      i.e., Toradol for pain control.  3. Hypertension, controlled.  Continue current medications.  4. Hyperlipidemia.  The patient is on Crestor.  Will continue that.  5. Diabetes mellitus.  Continue Januvia and metformin in addition to      Lantus.  Will put him on sliding-scale insulin coverage with      regular insulin on a sensitive scale.  Check a hemoglobin A1c.  6. Deep vein thrombosis/gastrointestinal prophylaxis.  Pepcid and      subcu heparin.      Oswald Hillock, MD  Electronically Signed     BA/MEDQ  D:  02/23/2008  T:  02/23/2008  Job:  [id number]   cc:   Pramod P. Pearlean Brownie, MD  Fax: [phone]   Primary Care Physician  [place], [place]

## 2010-05-27 ENCOUNTER — Ambulatory Visit: Payer: Medicare Other | Admitting: Cardiology

## 2010-05-29 NOTE — Cardiovascular Report (Signed)
[name], [name]            ACCOUNT NO.:  [id number]   MEDICAL RECORD NO.:  [id number]          PATIENT TYPE:  INP   LOCATION:  6527                         FACILITY:  [facility]   PHYSICIAN:  Arvilla Meres, M.D. LHCDATE OF BIRTH:  09-24-1940   DATE OF PROCEDURE:  12/17/2004  DATE OF DISCHARGE:                              CARDIAC CATHETERIZATION   PRIMARY CARE PHYSICIAN:  Dr. Rudi Heap in [place].   CARDIOLOGIST:  Dr. Andee Lineman in the [place] office.Marland Kitchen   PATIENT IDENTIFICATION:  [name] is a 70 year old male with multiple  cardiac risk factors. He underwent preoperative stress test in June of this  year prior to his knee replacement which was abnormal and suggestive of some  anterior ischemia. At that time, he underwent cardiac catheterization by Dr.  Bonnee Quin which showed a left dominant system with a totally occluded  diagonal and some nonobstructive disease in the LAD. There was also a high-  grade lesion and a moderate-sized nondominant right coronary artery. Based  on these results, he was treated medically and has done fine since that time  without any chest pain. Yesterday he woke up with severe chest pain  radiating to his back and into his jaw associated with shortness of breath  and some mild nausea. He went to [facility] and initial EKG was negative as  well as was initial cardiac markers. He then underwent chest CT which showed  no evidence of dissection. He continued to have pain and eventually his  cardiac enzymes turned positive with a troponin of greater than three. He is  thus referred for a diagnostic cardiac catheterization. He has been treated  with IV nitroglycerin but this has been limited by hypotension. He has also  been on heparin and Integrilin. He continued to have some pain this morning  but on arrival to the lab was relatively pain-free.   PROCEDURES PERFORMED:  1.  Selective coronary angiography.  2.  Left heart cath.  3.  Left  ventriculogram.   DESCRIPTION OF PROCEDURE:  The risks and benefits of the catheterization  were explained to [name]; consent was signed and placed on the chart. A  6-French arterial sheath was placed in the right femoral artery using a  modified Seldinger technique. Standard catheters including a preformed  Judkins JL-4, JR-4 and angled pigtail were used for the procedure. All  catheter exchanges were made over wire. There were no apparent  complications. The patient did develop some transient hypotension with blood  systolic blood pressures down to the 60s and 70 range with the injection of  intracoronary nitroglycerin. This resolved quickly. Central aortic pressure  is 100/63 with mean of 78. LV pressure was 86/1 with an EDP of 8.   Left main was normal.   LAD was moderate-to-large vessel coursing to the apex. There significant  calcification in the proximal portion. It gave off two diagonal branches and  two proximal septal branches. In the proximal portion of the LAD, there was  significant amount of atherosclerotic plaquing of about 40-50%. This crossed  the takeoff of the first diagonal. In the midsection right after the  takeoff  of the second diagonal, there was a 70% focal stenosis. This did not appear  flow-limiting. In the mid to distal LAD just after the takeoff of the second  diagonal, there was tubular 50% lesion. There was some moderate diffuse  disease distally. The first diagonal was subtotally occluded which was old.   The left circumflex was a large dominant system. It gave off a large ramus  branch, a large branching OM-1, a large posterolateral and a moderate-sized  PDA. In the proximal portion of the circumflex, there was a 30% stenosis. In  the distal AV groove circumflex, there was a 30-40% tubular stenosis prior  to take off the PDA. The PDA had luminal irregularities of about 30%. In the  ostial portion of the ramus, there was a 60-70% lesion. This was  unchanged  from previous. In the lower branch of the OM-1, there was a 50% tubular  stenosis.   The right coronary artery, which was previously a moderate-sized nondominant  vessel with a significant RV branch, was totally occluded with dye hangup.   Left ventriculogram shot in the RAO approach showed an EF of 60% with no  wall motion abnormalities.   ASSESSMENT:  1.  Left dominant system with nonobstructive coronary disease in the left      anterior descending artery and left circumflex. The left anterior      descending artery does have a mid 70% lesion which does not appear flow-      limiting at this time.  2.  Totally occluded right coronary artery with evidence of clot and dye      hangup. This was previously shown to be a moderate-sized nondominant      vessel with a large right ventricular branch. This is certainly the      culprit artery and likely complicated by a somewhat of an right      ventricular infarct given his extreme sensitivity to NTG.   DISCUSSION/PLAN:  I have reviewed the films with Dr. Juanda [name] and given his  ongoing chest pain will proceed with percutaneous intervention on the right  coronary artery. The remainder of his coronary disease will be treated  medically with aggressive risk factor management. Should he develop  recurrent angina, consideration could be turned to the mid-LAD.      Arvilla Meres, M.D. [facility]  Electronically Signed     DB/MEDQ  D:  12/17/2004  T:  12/17/2004  Job:  [id number]   cc:   Learta Codding, M.D. [facility]  [address]  [address]  [place]  [place] [address]   Ernestina Penna, M.D.  Fax: [phone]

## 2010-05-29 NOTE — H&P (Signed)
NAMEJAMISON, SOWARD          ACCOUNT NO.:  [id number]   MEDICAL RECORD NO.:  [id number]          PATIENT TYPE:   LOCATION:                                 FACILITY:   PHYSICIAN:  Ollen Gross, M.D.         DATE OF BIRTH:   DATE OF ADMISSION:  07/31/2004  DATE OF DISCHARGE:                                HISTORY & PHYSICAL   DATE OF OFFICE VISIT AND HISTORY AND PHYSICAL:  July 28, 2004   CHIEF COMPLAINT:  Right knee pain.   HISTORY OF PRESENT ILLNESS:  A 70 year old male, who has previously  undergone a left knee approximately 8 months ago and has done quite well  with his left knee.  He has known end-stage arthritis of the right knee  which has been ongoing for some time now.  He successfully underwent a left  total knee replacement arthroplasty, felt to be an excellent candidate for  the right.  Risks and benefits of the procedure have been discussed with the  patient, and he elects to proceed with surgery.   ALLERGIES:  No known drug allergies.   CURRENT MEDICATIONS:  1.  Diovan 160/12.5 daily q.a.m.  2.  Metformin ER 500 mg 3 tabs q.a.m.  3.  Lantus 20 mg q.h.s.  4.  Lipitor 20 mg daily.  5.  Hydroxyzine ACL 25 mg p.r.n. anxiety.  6.  Paroxetine HCL 20 mg q.h.s.  7.  Toprol XL 25 mg daily.  8.  Methocarbamol 500 mg t.i.d. p.r.n.  9.  He has stopped Daypro.  He has stopped his baby aspirin, stopped      glucosamine and chondroitin.   PAST MEDICAL HISTORY:  1.  Hypertension.  2.  Coronary arterial disease.  3.  Type 2 diabetes mellitus.  4.  History of urinary tract infections.  5.  History of renal calculi.  6.  Dyslipidemia.  7.  History of adenomatous colon polyps.   PAST SURGICAL HISTORY:  Right shoulder rotator cuff repair in March 2004.   SOCIAL HISTORY:  Married, retired, works in [employer], political party, 3 beers over a 2  week period, 1 child.   FAMILY HISTORY:  Father with a history of heart disease.  Brother with a  history of heart disease.  Mother with a  history of colon cancer.  Arthritis  runs in the family.   REVIEW OF SYSTEMS:  GENERAL:  No fevers, chills, or night sweats.  NEUROLOGIC:  No seizures, syncope, paralysis.  RESPIRATORY:  No shortness of  breath, productive cough, or hemoptysis.  CARDIOVASCULAR:  No chest pain,  angina, or orthopnea, although he does have a history of coronary arterial  disease.  GI:  No nausea, vomiting, diarrhea, or constipation.  GU:  No  dysuria, hematuria, or discharge.  MUSCULOSKELETAL:  Right knee found in the  history of present illness.   PHYSICAL EXAMINATION:  VITAL SIGNS:  Pulse 56, respirations 12, blood  pressure 162/72.  GENERAL:  A 70 year old white male, well-nourished, well-developed, no acute  distress.  He is alert, oriented, cooperative, very pleasant, appears to be  a good historian.  HEENT:  Normocephalic, atraumatic.  Pupils are round and reactive.  Oropharynx clear.  EOMs intact.  NECK:  Supple.  No carotid bruits.  CHEST:  Clear anterior and posterior chest walls.  No rhonchi, rales, or  wheezing or other adventitious sounds.  HEART:  Regular rate and rhythm, no murmur, S1, S2 noted.  ABDOMEN:  Soft, nontender, slightly round.  Bowel sounds present.  GU/RECTAL/BREASTS/GENITALIA:  Not done.  Not pertinent to present illness.  EXTREMITIES:  Right knee.  Right knee shows range of motion of 5-110  degrees.  Marked crepitus is noted.  Tender more medial than lateral.   IMPRESSION:  1.  Osteoarthritis, right knee.  2.  Coronary arterial disease.  3.  Hypertension.  4.  Type 2 diabetes mellitus.  5.  Dyslipidemia.  6.  History of urinary tract infections.  7.  History of renal calculi.  8.  History of adenomatous colonic polyps.   PLAN:  The patient admitted to [facility] to undergo a right total  knee replacement arthroplasty.  Surgery will be performed by Dr. Ollen Gross.  His cardiologist, Dr. Nona Dell.  Dr. Diona Browner will be  notified of the room number  on admission and be consulted to assist with  medical management of the patient throughout the hospital course.       ALP/MEDQ  D:  07/30/2004  T:  07/30/2004  Job:  [id number]   cc:   Ollen Gross, M.D.  [facility]  [address]  [address]  [place]  [place] [address]  Fax: [phone]   Jonelle Sidle, M.D. [facility]  [address]  [place]  [place] [address]  Fax: [phone]

## 2010-05-29 NOTE — Discharge Summary (Signed)
NAMEPACE, LAMADRID            ACCOUNT NO.:  [id number]   MEDICAL RECORD NO.:  [id number]          PATIENT TYPE:  INP   LOCATION:  1504                         FACILITY:  [facility]   PHYSICIAN:  Ollen Gross, M.D.    DATE OF BIRTH:  08-06-1940   DATE OF ADMISSION:  07/31/2004  DATE OF DISCHARGE:  08/03/2004                                 DISCHARGE SUMMARY   ADMITTING DIAGNOSES:  1.  Osteoarthritis right knee.  2.  Coronary arterial disease.  3.  Hypertension.  4.  Type 2 diabetes mellitus.  5.  Dyslipidemia.  6.  History of urinary tract infection.  7.  History of renal calculi.  8.  History of adenomatous colonic polyps.   DISCHARGE DIAGNOSES:  1.  Osteoarthritis right knee status post right total knee arthroplasty.  2.  Coronary arterial disease.  3.  Hypertension.  4.  Type 2 diabetes mellitus.  5.  Dyslipidemia.  6.  History of urinary tract infection.  7.  History of renal calculi.  8.  History of adenomatous colonic polyps.  9.  Postoperative hyponatremia.   PROCEDURES:  July 31, 2004 - right total knee arthroplasty. Surgeon:  Dr.  Homero Fellers Aluisio. Assistant:  Avel Peace, P.A.-C. Anesthesia:  Spinal.  Minimal blood loss. Hemovac drain x1. Tourniquet time 39 minutes at 300  mmHg.   BRIEF HISTORY:  Mr. Bernards is a 70 year old male with severe end-stage  arthritis of the right knee, had a previous successful left total knee, now  presents for a right total knee.   CONSULTS:  None.   LABORATORY DATA:  Preoperative CBC:  Hemoglobin 15.0, hematocrit 43.1, white  cell count 8.0, differential within normal limits. Postoperative hemoglobin  13.7. Last noted H&H 12.5 and 35.7. PT/PTT preoperatively 12.4 and 27  respectively, INR 0.9. Serial protime followed. Last noted PT/INR 19.8 and  1.7. Chem panel on admission:  Elevated glucose of 191, low albumin of 3.4.  Remaining chem panel all within normal limits. Serial BMETs are followed.  Sodium did drop from 139 to 129,  stabilized at 129. Glucose went up to 260,  back down to 253. Chloride dropped from 103 down to 92. Calcium dropped from  9.2 to 8.3. Urinalysis:  Trace ketones, otherwise negative. Blood group and  type O positive. Portable chest on July 31, 2004:  Poor inspiration with  mild bibasilar atelectasis, aspiration pneumonitis cannot be excluded,  especially of left lower lung.   HOSPITAL COURSE:  Admitted to [facility], taken to the OR,  underwent above procedure without complication. The patient tolerated the  procedure well, later transferred to the recovery room and then the  orthopedic floor to continue postoperative care. Vital signs were followed.  He was doing fairly well for day #1 after knee surgery. Belly was a little  bit distended, no flatus yet. Hemovac drain was pulled. He started getting  up with physical therapy, up to the bed. By day #2 he was actually doing a  little bit better. Dressing was changed, incision was healing well. IV and  PCA was discontinued. He got up with physical therapy and walked  40 feet and  then later 60 feet, showing improvement. By day #3 he had been weaned over  to his p.o. medications, doing well. He had ambulated 130 feet by day #3.  Was having some pain but was tolerating it. Arrangements were made for home  care and he was discharged home later that day.   DISCHARGE PLAN:  1.  The patient discharged home on August 03, 2004.  2.  Discharge diagnoses:  Please see above.  3.  Discharge medications:  Percocet 7.5 mg, Robaxin, and Coumadin.  4.  Diet:  Cardiac diabetic diet.  5.  Follow up 2 weeks from surgery.  6.  Activity:  Weightbearing as tolerated. Home health PT and home health      nursing. May start showering, total knee protocol.   DISPOSITION:  Home.   CONDITION UPON DISCHARGE:  Improved.       ALP/MEDQ  D:  08/17/2004  T:  08/17/2004  Job:  [id number]

## 2010-05-29 NOTE — Op Note (Signed)
[name], [name]            ACCOUNT NO.:  [id number]   MEDICAL RECORD NO.:  [id number]          PATIENT TYPE:  INP   LOCATION:  1504                         FACILITY:  [facility]   PHYSICIAN:  Ollen Gross, M.D.    DATE OF BIRTH:  10-07-1940   DATE OF PROCEDURE:  07/31/2004  DATE OF DISCHARGE:                                 OPERATIVE REPORT   PREOPERATIVE DIAGNOSIS:  Osteoarthritis, right knee.   POSTOPERATIVE DIAGNOSIS:  Osteoarthritis, right knee.   PROCEDURE:  Right total knee arthroplasty.   SURGEON:  Dr. Lequita Halt   ASSISTANT:  Avel Peace, PA-C   ANESTHESIA:  Spinal.   ESTIMATED BLOOD LOSS:  Minimal.   DRAIN:  Hemovac x 1.   TOURNIQUET TIME:  39 minutes at 300 mmHg.   COMPLICATIONS:  None.   CONDITION:  Stable to recovery.   BRIEF CLINICAL NOTE:  [name] is a 70 year old male with severe end-  stage arthritis of the right knee.  He has had a previous successful left  total knee arthroplasty and presents now for right total knee arthroplasty.   PROCEDURE IN DETAIL:  After the successful administration of spinal  anesthetic, a tourniquet is placed high on the right thigh and the right  lower extremity prepped and draped in the usual sterile fashion.  Extremity  was wrapped in Esmarch, knee flexed, tourniquet inflated to 300 mmHg.  A  standard midline incision was made with a 10 blade through the subcutaneous  tissue to the level of the extensor mechanism.  A fresh blade is used to  make a medial parapatellar arthrotomy.  Then the soft tissue over the  proximal and medial tibia is subperiosteally elevated to the joint line with  a knife and into the pseudomembranous bursa with a Cobb elevator.  Soft  tissue over the proximal and lateral tibia is also elevated with attention  being paid to avoiding the patellar tendon on the tibial tubercle.  Patella  is everted, knee flexed 90 degrees, and ACL and PCL removed.  Drill is used  to create a starting hole, and  the distal femur canal is irrigated. A 5-  degree right valgus alignment guide is placed and referencing off the  posterior condyles, rotation is marked and a block pinned to remove 10 mm  off the distal femur.  Distal femoral resection is made with an oscillating  saw.  A sizing blocks is placed, and a size 4 is most appropriate.  Rotation  is marked at the epicondylar axis.  Size 4 cutting block is placed, and then  the anterior, posterior, and chamfer cuts are made.   Tibia is subluxed forward, and the menisci are removed.  Extramedullary  tibial guide is placed, referencing proximally at the medial aspect of the  tibial tubercle and distally along the second metatarsal axis and tibial  crest.  Block is pinned to remove 10 mm off the nondeficient lateral side;  however, this would not get down to the base of the medial defect unless we  took an additional 2 mm.  Tibial resection was performed with an oscillating  saw.  Size 4 is the most appropriate tibial component and the proximal tibia  prepared with the modular drill and keel punch for a size 4.  Femoral  preparation is completed with the intercondylar cut.   Size 4 mobile bearing tibial trial with a size 4 posterior stabilized  femoral trial and a 10 mm posterior stabilized rotating platform insert  trial are placed.  With the 10, full extension is achieved with excellent  varus and valgus balance throughout the full range of motion.  The patella  is everted and thickness measured to be 25 mm.  Freehand resection is taken  to 14 mm, 41 template placed, and lug holes are drilled; trial patella is  placed, and it tracks normally.  The osteophytes are then removed off the  posterior femur with the trial in place.  All trials are removed, and the  cut bone surfaces are prepared with pulsatile lavage.  Cement is mixed and  once ready for implantation, the size 4 mobile bearing tibial tray, size 4  posterior stabilized femur, and 41  patella are cemented into place, and the  patella was held with a clamp.  Trial 10 mm inserts are placed, the knee  held in full extension and all extruded cement removed.  Once the cement is  fully hardened, then the permanent 10 mm posterior stabilized rotating  platform insert is placed into the tibial tray.  Wound is copiously  irrigated with saline solution and the extensor mechanism closed over  Hemovac drain with interrupted #1 Vicryl.  Flexion against gravity was 135  degrees.  Tourniquets is released with a total time of 39 minutes.  Subcu is  closed with interrupted 2-0 Vicryl, subcuticular running 4-0 Monocryl.  The  incision is cleaned and dried and Steri-Strips and a bulky sterile dressing  applied.  Drain is hooked to suction, and he is placed into a knee  immobilizer, awakened, and transported to recovery in stable condition.       FA/MEDQ  D:  07/31/2004  T:  07/31/2004  Job:  [id number]

## 2010-05-29 NOTE — Discharge Summary (Signed)
[name], [name]            ACCOUNT NO.:  [id number]   MEDICAL RECORD NO.:  [id number]          PATIENT TYPE:  INP   LOCATION:  0465                         FACILITY:  [facility]   PHYSICIAN:  Ollen Gross, M.D.    DATE OF BIRTH:  Sep 20, 1940   DATE OF ADMISSION:  12/11/2003  DATE OF DISCHARGE:  12/16/2003                                 DISCHARGE SUMMARY   ADMISSION DIAGNOSES:  1.  Osteoarthritis to bilateral knees, left more symptomatic than right.  2.  Hypertension.  3.  History of urinary tract infection.  4.  Insulin-dependent diabetes mellitus.   DISCHARGE DIAGNOSES:  1.  Osteoarthritis to left knee status post left total knee arthroplasty,      computer navigated.  2.  Hypertension.  3.  History of urinary tract infection.  4.  Insulin-dependent diabetes mellitus.  5.  Mild postoperative hyponatremia, improved.  6.  Mild postoperative hypokalemia.   CONSULTATIONS:  None.   PROCEDURE:  Date of surgery 12/11/03, left total knee arthroplasty, computer  navigated.  Surgeon was Dr. Lequita Halt.  Assisted Alexzandrew Julien Girt, PA-C.  Anesthesia:  Spinal.  Minimal blood loss.  Hemovac drain x 1.  Tourniquet  time 64 minutes at 300 mmHg.   BRIEF HISTORY:  [name] is a 70 year old male with severe end-stage  arthritis to the left knee.  He has failed nonoperative management and now  present for a left total knee.   LABORATORY DATA:  Preop hemoglobin 15.1, hematocrit 45.2, differential  within normal limits.  Postop hemoglobin 13.2.  Last night H&H 12.5 and  36.5.  PT and PTT preop 12.6 and 26 respectively with an INR of 0.9.  Serial  pro-times followed.  Last PT/INR 21 and 2.3.  Chem panel preop elevated  glucose 265, otherwise, normal.  Serial BMETs are followed.  Potassium  dropped from 3.9 to 3.4, last noted at 3.3.  Sodium dropped from 137 to 132  and went back up 135.  Glucose came down to 177.  Preop UA with elevated  specific gravity of 1.036, positive glucose,  otherwise, negative.  Blood  group O+.  Preop EKG 12/04/03, sinus bradycardia, otherwise, normal EKG.  No  significant changes since 9/05 compared by Dr. Severiano Gilbert.  A two-view chest on  12/04/03 showed no acute disease.   HOSPITAL COURSE:  Patient was admitted to [facility] and taken to  OR.  He underwent the above procedure.  He tolerated the procedure well.  He  was later transferred to recovery and then orthopedic floor.  He was started  on PCA and p.o. analgesia for pain control following surgery.  He had some  pain on day 1, started itching the night of surgery, and the morning of day  1, p.o. medications were changes.  He was given Benadryl.  He was placed on  a sliding scale because of the elevated glucose.  By day 2 he continued with  a fair amount of itching.  He was given Benadryl and also switched over to  Atarax.  He was started on some IV Pepcid for further H1and H2 blocking.  The itching  did improve.  He had medication change and was eventually  switched over to Vicodin.  He only had 1 Vicodin ordered with increase to 2.  PCA was discontinued.  He was given potassium supplements for the low  potassium level.  Fluids were also discontinued on day 2 to help out with  the low sodium.  It was felt that he may be ready to go home over the  weekend; therefore, discharge prescriptions were placed on the chart  depending on his progress with therapy.  He did improve through the weekend,  but did require further therapy.  The Vicodin was discontinued because of  again some itching.  He was switched over to [facility] for better pain  control.  On day 2 the dressing was changed.  Incision was healing well.  He  continued to receive therapy throughout the weekend but was not ready to go.  He was reevaluated on 12/16/03 by Dr. Lequita Halt.  He was doing much better.  He  had been switched back over to Vicodin due to the fact that he had better  control.  He was placed on Vicodin  ES.  Incision was healing well.  His  itching had improved.  He was progressing well with his therapy.  Home  arrangements were made, and he was discharged home.   DISCHARGE PLAN:  1.  Discharge home on 12/16/03.  2.  Discharge diagnosis - See above.  3.  Discharge medications were Vicodin ES, Coumadin and Robaxin.  4.  Diet - Diabetic diet.  5.  Activity - Weight bear as tolerated.  Home OP/PT.  May start showering      per total knee protocol.  6.  Follow up 2 weeks from surgery.   DISPOSITION:  Home.   CONDITION ON DISCHARGE:  Improved.      ALP/MEDQ  D:  02/04/2004  T:  02/04/2004  Job:  [id number]

## 2010-05-29 NOTE — Cardiovascular Report (Signed)
NAMEJERMANE, [name] NO.:  [id number]   MEDICAL RECORD NO.:  [id number]          PATIENT TYPE:  INP   LOCATION:  6527                         FACILITY:  [facility]   PHYSICIAN:  Charlies Constable, M.D. [facility] DATE OF BIRTH:  1940-09-03   DATE OF PROCEDURE:  12/17/2004  DATE OF DISCHARGE:                              CARDIAC CATHETERIZATION   CLINICAL HISTORY:  Mr. Shackleton is 70 years old and retired from [employer].  He has had documented coronary disease with a high grade lesion and a non-  dominant right coronary artery and a high grade lesion in a small diagonal  branch of the LAD by prior study.  He was admitted to the hospital with  chest pain and positive enzymes consistent with a non-ST elevation  infarction and studied today by Dr. Gala Romney who found a 70% lesion in the  proximal to mid LAD, 90% lesion in the first small diagonal branch with TIMI  I-II flow and a new total occlusion of the proximal right coronary artery  with some dye hang up.  We felt the right coronary artery occlusion was the  culprit vessel and we made a decision to proceed with intervention.  The  ejection fraction was 60% with normal wall motion.   PROCEDURE:  The procedure was performed via the sheath that was previously  placed during the diagnostic study.  We used a JR4 6 [place] guiding catheter  with side holes and a PT2 light support wire.  We crossed the totally  obstructed proximal right coronary artery with the wire with a moderate  amount of difficulty.  We then pre-dilated with a 2.25 by 50 mm Maverick  performing two inflations up to 8 atmospheres for 30 seconds.  We then  deployed a 2.25 by 18 mm mini-Vision stent deploying this with one inflation  of 10 atmospheres for 30 seconds and a second inflation to 14 atmospheres  with the balloon pulled inside the distal edge.  We then post dilated with a  2.5 by 50 mm Quantum Maverick performing two inflations to 14 atmospheres  for 30  seconds.  Final diagnostic studies were then performed through the  guiding catheter.  The patient tolerated the procedure well and left the  laboratory in satisfactory condition.   RESULTS:  Initially, the right coronary artery was totally occluded  proximally.  Following stenting, the stenosis improved to 0%.  There was 50%  stenosis downstream and the right coronary artery supplied large right  ventricular branches but did not supply posterior descending branch.   CONCLUSION:  Successful stenting of a totally occluded non-dominant right  coronary artery using a bare metal stent with improvement in the narrowing  from 100% to 0% and improvement of the flow from TIMI 0 to TIMI III flow.   DISPOSITION:  The patient was returned to the recovery room for further  observation.  The patient was enrolled in the Trighton study and will be on  Trighton study drugs for one year in lieu of Plavix.           ______________________________  Charlies Constable, M.D. [facility]  BB/MEDQ  D:  12/17/2004  T:  12/17/2004  Job:  [id number]   cc:   Ernestina Penna, M.D.  Fax: [phone]   Learta Codding, M.D. [facility]  [address]  [address]  [place]  [place] [address]

## 2010-05-29 NOTE — Op Note (Signed)
NAMEGARMON, DEHN            ACCOUNT NO.:  [id number]   MEDICAL RECORD NO.:  [id number]          PATIENT TYPE:  INP   LOCATION:  0004                         FACILITY:  [facility]   PHYSICIAN:  Ollen Gross, M.D.    DATE OF BIRTH:  05/27/1940   DATE OF PROCEDURE:  12/11/2003  DATE OF DISCHARGE:                                 OPERATIVE REPORT   PREOPERATIVE DIAGNOSIS:  Osteoarthritis left knee.   POSTOPERATIVE DIAGNOSIS:  Osteoarthritis left knee.   PROCEDURE:  Left total knee arthroplasty computer navigated.   SURGEON:  Ollen Gross, M.D.   ASSISTANT:  Alexzandrew L. Julien Girt, P.A.   ANESTHESIA:  Spinal.   ESTIMATED BLOOD LOSS:  Minimal.   DRAINS:  Hemovac x1.   TOURNIQUET TIME:  64 minutes at 300 mmHg.   COMPLICATIONS:  None.   CONDITION:  Stable to recovery.   INDICATIONS FOR PROCEDURE:  Mr. Shukla is a 70 year old male with severe  end-stage arthritis of the left knee and intractable pain.  He has failed  nonoperative management and presents now for left total knee arthroplasty.   DESCRIPTION OF PROCEDURE:  After successful administration of spinal  anesthetic, a tourniquet was placed high in the left thigh and the left  lower extremity prepped and draped in the usual sterile fashion.  Extremities wrapped in esmarch, knee flexed, tourniquet inflated 300 mmHg.  Midline incision is made with a 10 blade through the subcutaneous tissue to  the level of the extensor mechanism.  Fresh blade is used to make a medial  parapatellar arthrotomy.  Soft tissue over the proximal medial tibia  subperiosteally elevated to the joint line with the knife and then through  semimembranous bursa with Cobb elevator.  Soft tissue over the proximal  lateral tibia was also elevated with attention being paid to avoid the  patellar tendon on tibial tubercle.  Patella is everted and knee flexed 90  degrees.  ACL and PCL removed.  The menisci are removed also.  The 4 mm  Shantz pins are  then placed two into the tibia and two into the femur to  allow for placement of the computer navigation arrays.  Once placed, then  they are confirmed to be readable by the computer and we input all of the  anatomic data to create the computer generated models for the femur and  tibia.  Once completed, the alignment check showed 7 degrees of varus and 5  degree flexion contracture.   We subsequently placed the distal femoral cutting guide so that it would be  in the 0 degrees varus valgus and to remove 10 mm to the distal femur.  Distal femoral resection is made with an oscillating saw.  The guide is used  to confirm that the cut is at the appropriate height and inclination.  We  then placed the rotational guide on the distal femur.  Per the computer  model, size 5 was the appropriate femur and I checked it with the  intraoperative sizing guide which confirmed that size 5 was most  appropriate.  We used the epicondylar axis as a rotational reference and  placed the rotation guide, the computer navigated [place] to the appropriate  rotation.  We then placed a size 5 cutting block and the anterior, posterior  and Chamfer cuts are made.   The tibia is subluxed forward and the posterior aspect of the menisci are  removed.  The tibial cutting guide is placed and the cut is referenced to  remove 10 mm of the nondeficient lateral side.  The tibial resection is made  with an oscillating saw.  The cut is confirmed to be in 0 degrees varus  valgus and they also removed 10 mm from the lateral saw.  The size 4 is the  most appropriate tibial component and then the proximal tibia is prepared to  modular drill and Keel punch for the size 4.  Femoral preparation is then  completed with the intercondylar cut.   Trial size 5 posterior stabilized femur size 4 mobile bearing tibial tray  and 10 mm rotating platform posterior stabilized insert trial were placed.  Full extension was achieved with excellent  varus and valgus balance  throughout full range of motion.  The alignment in extension showed that the  overall alignment is 1 degree varus and that there was a less than 1 degree  flexion contracture.  This is all a big improvement from preoperatively.  We  then evert the patella and thickness was measured to be 26 mm.  Freehand  resection is taken to 14 mm, 41 templates plates, lug holes are drilled,  trial patella is placed and it tracks normally.  The osteophytes are then  removed about the posterior femur with the femoral trial in place.  All  trials removed.  Any cut bone surfaces are prepared with pulsatile lavage.  The pins for the computer navigation are then removed from the femur and  tibia.  Once the cement is mixed and once ready for implantation, the size 4  mobile bearing tibial tray, size 5 posterior stabilized femur and 41 patella  are cemented into place and patella is held with a clamp.  Trial 10 mm  inserts placed with the knee held in full extension.  All extruded cement  removed.  once the cement fully hardened, the permanent 10 mm posterior  stabilized rotating platform insert is placed into the tibial tray.  Full  extension is achieved with excellent varus and valgus balance throughout  full range of motion.  The extensor mechanism is then closed over Hemovac  drain with interrupted #1 PDS.  Flexion against gravity was 130 degrees.  Subcutaneous was closed with interrupted 2-0 Vicryl and subcuticular running  4-0 Monocryl.  The tourniquet had been released for a total time of 64  minutes. Steri-Strips and a bulky sterile dressing were applied.  Hemovac  drains put to suction and a bulky sterile dressing is applied.  He is then  placed into a knee immobilizer, awakened and transported to recovery in  stable condition.     Drenda Freeze   FA/MEDQ  D:  12/11/2003  T:  12/11/2003  Job:  [id number]

## 2010-05-29 NOTE — Discharge Summary (Signed)
NAMECHAZE, HRUSKA NO.:  [id number]   MEDICAL RECORD NO.:  [id number]          PATIENT TYPE:  INP   LOCATION:  3708                         FACILITY:  [facility]   PHYSICIAN:  Dorian Pod, NP    DATE OF BIRTH:  [birth year]   DATE OF ADMISSION:  12/17/2004  DATE OF DISCHARGE:  12/20/2004                                 DISCHARGE SUMMARY   DISCHARGING PHYSICIAN:  Willa Rough, MD.   PRIMARY CARE PHYSICIAN:  Wende Crease, MD.   CARDIOLOGIST:  Learta Codding, MD, [facility].   DISCHARGE DIAGNOSES:  1.  Coronary artery disease, status post non-ST-elevation myocardial      infarction, status post cardiac catheterization with a bare metal stent      to the right coronary artery this admission.  2.  Hypokalemia, resolved.  3.  Hypertension.  4.  Triton Research.   PAST MEDICAL HISTORY:  1.  Non-insulin-dependent diabetes.  2.  Status post left and right knee surgery.  3.  Dyslipidemia.  4.  Hypertension.   PROCEDURES THIS ADMISSION:  1.  Cardiac catheterization on December 17, 2004, by Dr. Nicholes Mango,      showing nonobstructive coronary artery disease in the LAD and left      circumflex.  The LAD has a medial 70% lesion, which did not appear to be      flow limiting.  The patient also with a totally occluded right coronary      artery with evidence of clot and dye hang-up.  2.  Status post percutaneous intervention by Dr. Charlies Constable on December 17, 2004, with successful stenting of the totally occluded, nondominant      right coronary artery using a bare metal stent with improvement in the      narrowing from 100% to 0%, and improvement of the flow from TIMI-0 to      TIMI-3 flow.   HOSPITAL COURSE:  Mr. Carver is a 70 year old gentleman with no known  history of coronary artery disease, who was initially admitted to [facility] with complaints of chest pain. The patient, with a positive family  history of coronary artery disease, recently  underwent a nuclear imaging  study prior to knee surgery.  The patient apparently had a positive study  and he was referred for cardiac catheterization.  His ejection fraction was  44% on nuclear testing.  The patient's initial cardiac catheterization was  done on June 17, 2004, by Dr. Riley Kill.  Ejection fraction at that time was  found to be 71% without significant mitral regurgitation.  The images were  reviewed with Dr. Riley Kill as well as Dr. Diona Browner and it was felt patient  could be treated with medical therapy.  The patient denied any episodes of  chest pain and proceeded with his knee surgery that was previously  scheduled.  The patient presented to [facility] on this admission  complaining of sudden onset of substernal chest pain.  The patient was  transferred to [facility] for further evaluation and possible cardiac  catheterization.  The patient was found to have  an elevated troponin of 0.36  with a relative index of 9.9, a CK-MB of 13.3.  The patient was treated with  heparin, Integrilin, nitroglycerin, taken to the cath lab on December 17, 2004, by Dr. Gala Romney, results as stated above.  After review of films,  patient continued with an intervention to the right coronary artery by Dr.  Charlies Constable.  Patient tolerated the procedure without complications,  medications were adjusted, patient without any further episodes of chest  discomfort, and a stable cath site.  Dr. Myrtis Ser in to see the patient on day  of discharge, vital signs stable, sinus rhythm on the monitor, patient  discharged home to follow up with Dr. Andee Lineman.  At time of discharge, the  patient was instructed to follow a low salt, low fat, low cholesterol diet.  He was given the [facility] post cardiac catheterization discharge  sheet and the post MI discharge contract information.  He was placed on the  Triton Study Drug.   DISCHARGE MEDICATIONS:  1.  Lipitor 20 mg daily.  2.  Toprol-XL 25 mg daily.   3.  Lantus 20 units at bedtime.  4.  Glucophage-XR 500 mg daily or as previously instructed.  5.  Aspirin 325 mg daily.  6.  Diovan/HCT 160/12.5 mg daily.  7.  Paxil as previously taken.  8.  Tylenol-PM as needed.  9.  Nitroglycerin as needed.  10. The patient is not on any Plavix secondary to Triton Drug Study.   FOLLOWUP:  Follow up with Dr. Andee Lineman, January 4th, at 2 p.m.  Patient is to  call our office for any problems from the cath site.   DURATION OF DISCHARGE ENCOUNTER:  40 minutes.      Dorian Pod, NP     MB/MEDQ  D:  02/05/2005  T:  02/06/2005  Job:  [id number]   cc:   Willa Rough, M.D.  [address]  [address]  [place]  [place] [address]   Wende Crease, M.D.   Learta Codding, M.D. [facility]  [address]  [address]  [place]  [place] [address]   Arvilla Meres, M.D. [facility]  [facility]  [address]  [place]  [place] [address]   Charlies Constable, M.D. [facility]  [address]  [address]  [place]  [place] [address]   Arturo Morton. Riley Kill, M.D. [facility]  [address]  [address]  [place]  [place] [address]   Jonelle Sidle, M.D. [facility]  [address]  [place]  [place] [address]

## 2010-05-29 NOTE — Assessment & Plan Note (Signed)
[facility]                            [facility]ICE NOTE   [name], [name]                   MRN:          [id number]  DATE:07/21/2005                            DOB:          13-Jan-1940    HISTORY OF PRESENT ILLNESS:  The patient is a 70 year old male with history  of non-ST-elevation myocardial infarction treated with bare metal stenting.  The patient recently underwent colonoscopy on July 10.  Plavix was held for  seven days.  The patient now presents for followup.  He also had a  Cardiolite stress study ordered in the interim which was performed on June 21, 2005 which showed no definite ischemia.  His ejection fraction was 54%.   MEDICATIONS:  1.  Glucophage XR 1500 mg p.o. daily.  2.  Diovan/hydrochlorothiazide 160/12.5 daily.  3.  Glucosamine chondroitin.  4.  Lipitor 20 mg daily.  5.  Lantus insulin 20 units nightly.  6.  __________ study drug.  7.  Coated aspirin.  8.  Niaspan 500 mg nightly.   PHYSICAL EXAMINATION:  VITAL SIGNS:  Blood pressure 102/50, heart rate 58  beats per minute.  He weighs 192 pounds.  NECK EXAM:  Reveals normal carotid upstrokes, no carotid bruits.  LUNGS:  Clear breath sounds bilaterally.  HEART:  Regular rate and rhythm with normal S1 S2.  No murmurs, rubs or  gallops.  ABDOMEN:  Soft.  EXTREMITY EXAM:  No edema.   PROBLEM LIST:  1.  Coronary artery disease.      1.  Status post non-ST-elevation myocardial infarction with bare metal          stenting in December 2006.      2.  Residual 70% mid left anterior descending stenosis, obstructed          circumflex artery.      3.  Normal left ventricular function.      4.  __________ study drug.  2.  Dyslipidemia.  3.  Insulin-dependent diabetes mellitus.  4.  Hypertension, controlled.  5.  Remote tobacco use.  6.  History of intermittent rectal bleeding, status post recent colonoscopy.   PLAN:  The patient is doing well from a  cardiovascular perspective.  I made  no change in his medical regimen today.  The patient has resumed Plavix.  Will follow the patient in six months.                                   Learta Codding, MD, [facility]   GED/MedQ  DD:  07/26/2005  DT:  07/26/2005  Job #:  [id number]   cc:   Ernestina Penna, MD

## 2010-05-29 NOTE — Assessment & Plan Note (Signed)
[facility]                          [facility]ICE NOTE   [name], [name]                   MRN:          [id number]  DATE:02/21/2006                            DOB:          [birth year]    PRIMARY CARDIOLOGIST:  Dr. Lewayne Bunting.   REASON FOR VISIT:  Scheduled 6 month followup.   [name] is a 70 year old male, with known coronary artery disease,  last seen here in the clinic in July 2007, who now returns for scheduled  followup.   The patient continues to do well from a clinical standpoint, with no  interim development of symptoms suggestive of unstable angina pectoris.  He does, however, report easy bruiseability on both Plavix and aspirin.   The patient also reports taking himself off Niaspan this past December  secondary to elevation of his serum glucose levels, as well as his  hemoglobin A1c.  He also points out that his Lipitor was up titrated at  that time as well, from 20 to 40 daily.   CURRENT MEDICATIONS:  1. Plavix.  2. Aspirin 81 daily.  3. Diovan/HCT 160/12.5 daily.  4. Celebrex 40 daily.  5. Glucophage XR 1000 mg b.i.d.  6. Lantus 22 units q.h.s.  7. Lipitor 40 daily.  8. Glucosamine 2 tablets daily  9. Toprol XL 25 daily.   PHYSICAL EXAMINATION:  Blood pressure 143/79, pulse 63, regular, weight  205.8.  GENERAL:  70 year old male sitting upright in no distress.  HEENT:  Normocephalic and atraumatic.  NECK:  Palpable bilateral carotid pulses without bruits; no JVD.  LUNGS:  Clear to auscultation, all fields.  HEART:  Regular rate and rhythm (S1, S2), no significant murmurs.  No  rubs or gallops.  ABDOMEN:  Soft, nontender, normoactive bowel sounds.  EXTREMITIES:  Palpable distal pulses without edema.  NEURO:  No focal deficits appreciable.   IMPRESSION:  1. Coronary artery disease.      a.     Status post Non-ST elevation myocardial infarction/bare       metal stenting 100% proximal RCA, December  2006.  TRITON study.      b.     Residual 70% mid LAD; nonobstructive CFX.      c.     Normal left ventricular function.  2. Dyslipidemia.      a.     Niaspan intolerant.  3. Insulin-dependent diabetes mellitus.  4. Hypertension.  5. Remote tobacco.  6. Easy bruiseability.   PLAN:  1. Discontinue Lipitor and initiate Crestor at 5 mg daily for better      management of noted low HDL levels.  2. Schedule followup fasting lipids/liver profile in 12 weeks.  3. Discontinue Plavix, given that the patient is now well past one      year after undergoing bare-metal stenting of his RCA.  4. Schedule return clinic followup with myself and Dr. Andee Lineman in 6      months.      Gene Serpe, PA-C  Electronically Signed      Learta Codding, MD,FACC  Electronically Signed   GS/MedQ  DD: 02/21/2006  DT: 02/21/2006  Job #: [id number]   cc:   Ernestina Penna, M.D.

## 2010-05-29 NOTE — Cardiovascular Report (Signed)
NAMEZAMEER, BORMAN            ACCOUNT NO.:  [id number]   MEDICAL RECORD NO.:  [id number]          PATIENT TYPE:  OIB   LOCATION:  2899                         FACILITY:  [facility]   PHYSICIAN:  Arturo Morton. Riley Kill, M.D. [facility] OF BIRTH:  [birth year]   DATE OF PROCEDURE:  06/17/2004  DATE OF DISCHARGE:                              CARDIAC CATHETERIZATION   INDICATIONS:  The patient is a 70 year old gentleman who needs knee surgery.  He underwent Myoview imaging with adenosine. There was an anterobasal defect  that exhibits partial reversibility and this was new from the previous  nuclear study done. The ejection fraction was thought to be 44% by  radionuclide imaging. As a result of this, he was seen by Dr. Diona Browner and  Suszanne Conners. Julious Oka., and subsequently referred for diagnostic cardiac  catheterization.   PROCEDURES:  1.  Left heart catheterization  2.  Selective coronary arteriography.  3.  Selective left ventriculography.   DESCRIPTION OF PROCEDURE:  The patient was brought to the catheterization  laboratory and prepped and draped in the usual fashion. Through an anterior  puncture the femoral artery was easily entered. Following this, central  aortic and left ventricular pressures were measured with pigtail.  Ventriculography was performed in the 30 degrees RAO projection. Following  this, coronary arteriography was performed using standard Judkins catheters  without difficulty. The patient was moving somewhat after 1.5 mg of Versed  and at the completion of the procedure this was reversed with appropriate  therapy. He tolerated the procedure well and there were no complications. He  was taken to the holding area in satisfactory clinical condition.   HEMODYNAMIC DATA:  1.  Central aortic pressure 137/71, mean 96.  2.  Left ventricular pressure 136/11.  3.  No gradient on pullback across the aortic valve.   ANGIOGRAPHIC DATA:  1.  The left main coronary artery is a  large-caliber vessel that is free of      critical disease.  2.  The left anterior descending artery courses to the apex. There is a      smaller first diagonal branch that has a 90% ostial narrowing and then      is totally occluded in its mid portion. We do not see a significant      reversible area of collateralization in the distal distribution. The LAD      then courses to the apex. Just after the origin of the second diagonal      branch is an area of modest segmental plaquing of about 40-50%. The      remainder of the vessel is without critical disease.  3.  There is a ramus intermedius. The ramus intermedius probably has about      30-40% ostial narrowing, but it does not appear to be high grade.  4.  The AV circumflex is without critical narrowing. There is a fairly large      bifurcating marginal branch in the mid portion of the vessel. There is a      large posterolateral branch. The PDA has a fair amount of luminal  irregularity, but no areas of high-grade focal obstruction.  5.  The right coronary artery is a nondominant vessel. This has 80% hazy      lesion in the mid portion. There is then some scattered 30-40% area in      the mid vessel and the distal vessel supplies the right ventricle.   Ventriculography in the RAO projection reveals preserved global systolic  function. Ejection fraction is calculated at 71% on a good sinus beat. No  significant mitral regurgitation nor significant wall motion abnormalities  are noted.   CONCLUSIONS:  1.  Well-preserved overall left ventricular function.  2.  Minor luminal irregularity in the proximal LAD with total occlusion of      the first diagonal branch as noted in the above text.  3.  Scattered coronary irregularities as noted in the remaining dictation.   DISPOSITION:  I will review the case with Dr. Diona Browner. Initially we would  likely recommend medical therapy since he is fairly asymptomatic. He will  see Dr. Diona Browner  back in followup.       TDS/MEDQ  D:  06/17/2004  T:  06/17/2004  Job:  [id number]   cc:   Jonelle Sidle, M.D. [facility]   Suszanne Conners. Julious Oka.   Ollen Gross, M.D.  [facility]  [address]  [address]  [place]  [place] [address]  Fax: [phone]   Ernestina Penna, M.D.  [address] [place]  [place] [address]  Fax: [phone]

## 2010-06-10 ENCOUNTER — Encounter: Payer: Self-pay | Admitting: *Deleted

## 2010-06-11 ENCOUNTER — Other Ambulatory Visit: Payer: Self-pay | Admitting: Cardiology

## 2010-06-11 ENCOUNTER — Encounter: Payer: Self-pay | Admitting: Cardiology

## 2010-06-11 ENCOUNTER — Telehealth: Payer: Self-pay | Admitting: *Deleted

## 2010-06-11 ENCOUNTER — Ambulatory Visit (INDEPENDENT_AMBULATORY_CARE_PROVIDER_SITE_OTHER): Payer: Medicare Other | Admitting: Cardiology

## 2010-06-11 ENCOUNTER — Encounter: Payer: Self-pay | Admitting: *Deleted

## 2010-06-11 VITALS — BP 122/73 | HR 76 | Ht 66.0 in | Wt 200.0 lb

## 2010-06-11 DIAGNOSIS — Z9861 Coronary angioplasty status: Secondary | ICD-10-CM

## 2010-06-11 DIAGNOSIS — I2510 Atherosclerotic heart disease of native coronary artery without angina pectoris: Secondary | ICD-10-CM

## 2010-06-11 DIAGNOSIS — E785 Hyperlipidemia, unspecified: Secondary | ICD-10-CM

## 2010-06-11 DIAGNOSIS — I10 Essential (primary) hypertension: Secondary | ICD-10-CM

## 2010-06-11 NOTE — Assessment & Plan Note (Signed)
Blood pressure is well controlled. At times readings are a little bit elevated but given the recent data on aggressive blood pressure control in diabetic patients increase associated risk of renal dysfunction we will make no further adjustments in his antihypertensive medications.

## 2010-06-11 NOTE — Telephone Encounter (Signed)
Exercise Cardiolite scheduled for 06-17-2010 @ [facility]

## 2010-06-11 NOTE — Assessment & Plan Note (Signed)
Patient will statin drug therapy and this is followed by his endocrinologist.

## 2010-06-11 NOTE — Progress Notes (Signed)
HPI the patient is a 70 and-year-old male with history of coronary artery disease, stent placement to the right coronary artery and residual mid 70% LAD disease. The patient had a Cardiolite study done in 2008 which was low risk. The patient is unable to date is inhibitors or ARB secondary to angioedema.  In December he had a complex migraine headache presenting itself with dysphagia.   He had an echocardiogram done which showed an ejection fraction of 50 to 55%.  He had negative carotid Dopplers.  The patient also the  The patient has been doing well from a cardiovascular standpoint. He denies any chest pain or shortness of breath. His diabetes is under much better control. He exercises now. Also his blood pressures under better control the patient lost 13 pounds. He denies any orthopnea PND palpitations or syncope. We discussed the fact that the patient is due for a stress test.  Allergies  Allergen Reactions  . Ace Inhibitors   . Angiotensin Receptor Blockers   . Doxycycline   . Oxycodone-Acetaminophen     REACTION: unknown reaction  . Ramipril     Current Outpatient Prescriptions on File Prior to Visit  Medication Sig Dispense Refill  . amLODipine (NORVASC) 5 MG tablet Take 1 tablet (5 mg total) by mouth daily.  90 tablet  3  . aspirin 325 MG tablet Take 325 mg by mouth daily.        Marland Kitchen atorvastatin (LIPITOR) 20 MG tablet Take 20 mg by mouth daily.        . cetirizine (ZYRTEC) 10 MG tablet Take 10 mg by mouth daily.        . famotidine (PEPCID) 20 MG tablet Take 20 mg by mouth 2 (two) times daily.        . insulin glargine (LANTUS) 100 UNIT/ML injection Inject 40 Units into the skin at bedtime.       . metFORMIN (GLUCOPHAGE) 500 MG tablet Take 1,000 mg by mouth 2 (two) times daily with a meal.        . nitroGLYCERIN (NITROSTAT) 0.4 MG SL tablet Place 0.4 mg under the tongue every 5 (five) minutes as needed.        Marland Kitchen PARoxetine (PAXIL) 20 MG tablet Take 20 mg by mouth every morning.         . prazosin (MINIPRESS) 1 MG capsule Take 1 capsule (1 mg total) by mouth 2 (two) times daily.  180 capsule  3  . DISCONTD: chlorthalidone (HYGROTON) 25 MG tablet Take 25 mg by mouth daily.        Marland Kitchen DISCONTD: insulin aspart (NOVOLOG) 100 UNIT/ML injection Inject into the skin as directed.          Past Medical History  Diagnosis Date  . Diabetes mellitus   . Anxiety disorder   . Arthritis   . Hypertension   . Hyperlipidemia   . Coronary atherosclerosis of native coronary artery     s/p stent placement to the right coronary artery residual mid 70% LAD disease. Cardiolite study 2000 and a low risk. Normal ejection fraction  . Upper abdominal pain   . Heartburn   . Nonspecific elevation of levels of transaminase or lactic acid dehydrogenase (LDH)     Past Surgical History  Procedure Date  . Coronary angioplasty with stent placement   . Total knee arthroplasty   . Rotator cuff repair     Family History  Problem Relation Age of Onset  . Colon cancer Mother  diagnosed age 66  . Heart disease Father   . Breast cancer Sister   . Heart disease Brother     History   Social History  . Marital Status: Married    Spouse Name: N/A    Number of Children: N/A  . Years of Education: N/A   Occupational History  . 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 Smoking status: Former Smoker -- 1.0 packs/day for 25 years    Types: Cigarettes    Quit date: 01/12/1988  . Smokeless tobacco: Former Neurosurgeon    Types: Chew    Quit date: 01/11/1989   Comment: chewed 1 pack tobacco/day for 15 years  . Alcohol Use: Yes     very ware  . Drug Use: No  . Sexually Active: Not on file   Other Topics Concern  . 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 The remainder of the 18  point review of systems is  negative  PHYSICAL EXAM BP 122/73  Pulse 76  Ht 5\' 6"  (1.676 m)  Wt 200 lb (90.719 kg)  BMI 32.28 kg/m2  General: Well-developed, well-nourished in no distress Head: Normocephalic and atraumatic Eyes:PERRLA/EOMI intact, conjunctiva and lids normal Ears: No deformity or lesions Mouth:normal dentition, normal posterior pharynx Neck: Supple, no JVD.  No masses, thyromegaly or abnormal cervical nodes Lungs: Normal breath sounds bilaterally without wheezing.  Normal percussion Cardiac: regular rate and rhythm with normal S1 and S2, no S3 or S4.  PMI is normal.  No pathological murmurs Abdomen: Normal bowel sounds, abdomen is soft and nontender without masses, organomegaly or hernias noted.  No hepatosplenomegaly MSK: Back normal, normal gait muscle strength and tone normal Vascular: Pulse is normal in all 4 extremities Extremities: No peripheral pitting edema Neurologic: Alert and oriented x 3 Skin: Intact without lesions or rashes Lymphatics: No significant adenopathy Psychologic: Normal affect  ECG: Not available  ASSESSMENT AND PLAN

## 2010-06-11 NOTE — Assessment & Plan Note (Signed)
The patient is status post prior stent placement to right coronary artery disease with residual 70% LAD disease. He is due for a stress test and yours was scheduled for an exercise Cardiolite study. The patient is currently not on blocker therapy. Continue risk factor modification including statin therapy, therapeutic lifestyle changes and diabetes control which is much improved.

## 2010-06-11 NOTE — Patient Instructions (Addendum)
   Exercise Cardiolite If the results of your test are normal or stable, you will receive a letter.  If they are abnormal, the nurse will contact you by phone. Your physician wants you to follow up in: 6 months.  You will receive a reminder letter in the mail one-two months in advance.  If you don't receive a letter, please call our office to schedule the follow up appointment

## 2010-06-12 ENCOUNTER — Encounter: Payer: Self-pay | Admitting: Cardiology

## 2010-07-08 ENCOUNTER — Encounter: Payer: Self-pay | Admitting: *Deleted

## 2010-07-10 ENCOUNTER — Telehealth: Payer: Self-pay | Admitting: *Deleted

## 2010-07-10 NOTE — Telephone Encounter (Signed)
Noted  

## 2010-07-10 NOTE — Telephone Encounter (Signed)
[name] was scheduled for a stress test in June 2012. We had to cancel the test due to his insurance. They would not approve the test until October of 2012. He will see Dr. Andee Lineman in September and then we will Schedule the test with insurance approval.

## 2010-08-12 HISTORY — PX: CYSTOSCOPY W/ URETEROSCOPY W/ LITHOTRIPSY: SUR380

## 2010-08-13 ENCOUNTER — Other Ambulatory Visit: Payer: Self-pay | Admitting: Internal Medicine

## 2010-08-14 NOTE — Telephone Encounter (Signed)
Medication approved for 30 day supply. Patient needs an appointment.

## 2010-09-11 DIAGNOSIS — G9340 Encephalopathy, unspecified: Secondary | ICD-10-CM | POA: Insufficient documentation

## 2010-09-25 ENCOUNTER — Encounter: Payer: Self-pay | Admitting: Cardiology

## 2010-09-29 ENCOUNTER — Encounter: Payer: Self-pay | Admitting: Cardiology

## 2010-09-29 ENCOUNTER — Ambulatory Visit (INDEPENDENT_AMBULATORY_CARE_PROVIDER_SITE_OTHER): Payer: Medicare Other | Admitting: Cardiology

## 2010-09-29 VITALS — BP 149/82 | HR 78 | Ht 66.0 in | Wt 199.0 lb

## 2010-09-29 DIAGNOSIS — I10 Essential (primary) hypertension: Secondary | ICD-10-CM

## 2010-09-29 DIAGNOSIS — A9231 West Nile virus infection with encephalitis: Secondary | ICD-10-CM

## 2010-09-29 DIAGNOSIS — I2510 Atherosclerotic heart disease of native coronary artery without angina pectoris: Secondary | ICD-10-CM

## 2010-09-29 NOTE — Assessment & Plan Note (Signed)
I've given the patient appointment with infectious disease team at [facility]

## 2010-09-29 NOTE — Assessment & Plan Note (Signed)
No recurrent chest pain. Continue medical therapy. 

## 2010-09-29 NOTE — Patient Instructions (Signed)
   Start Amlodipine 5mg  daily, if systolic blood pressure (top number) 150/90  Referral to Infectious Disease at [facility] Your physician wants you to follow up in: 6 months.  You will receive a reminder letter in the mail one-two months in advance.  If you don't receive a letter, please call our office to schedule the follow up appointment

## 2010-09-29 NOTE — Assessment & Plan Note (Signed)
Patient to restart amlodipine blood pressure consistently greater than 150/90 mmHg

## 2010-09-29 NOTE — Progress Notes (Signed)
HPI The patient is a 70 year old male with a prior history of coronary disease and percutaneous coronary intervention. The patient was recently hospitalized with encephalopathy requiring intubation and mechanical ventilation. 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 The patient recovered without any significant neurological sequelae. He has a followup appointment neurology scheduled, but not with the infectious disease specialists. During the hospitalization he was taken off amlodipine although his blood pressures now slowly increasing. From a cardiac standpoint is doing well otherwise. He denies any chest pain shortness of breath orthopnea PND. I performed a bedside echocardiogram today which showed normal LV function and no pericardial effusion. There was also no significant valvular pathology.  Allergies  Allergen Reactions  . Ace Inhibitors   . Angiotensin Receptor Blockers   . Doxycycline   . Oxycodone-Acetaminophen     REACTION: unknown reaction  . Ramipril     Current Outpatient Prescriptions on File Prior to Visit  Medication Sig Dispense Refill  . amLODipine (NORVASC) 5 MG tablet Take 1 tablet (5 mg total) by mouth daily.  90 tablet  3  . aspirin 325 MG tablet Take 325 mg by mouth daily.        Marland Kitchen atorvastatin (LIPITOR) 20 MG tablet Take 20 mg by mouth daily.        . cetirizine (ZYRTEC) 10 MG tablet Take 10 mg by mouth daily.        . Cholecalciferol (VITAMIN D3) 2000 UNITS TABS Take 1 tablet by mouth daily.        . famotidine (PEPCID) 20 MG tablet TAKE 1 TABLET TWICE A DAY 30 MINUTES BEFORE BREAKFAST AND SUPPER  60 tablet  0  . insulin glargine (LANTUS) 100 UNIT/ML injection Inject 20 Units into the skin at bedtime.       . Liraglutide (VICTOZA) 18 MG/3ML SOLN Inject 1.8 mg into the skin daily before supper.        . metFORMIN (GLUCOPHAGE) 500 MG tablet Take 1,000 mg by mouth 2 (two) times daily with a meal.        . nitroGLYCERIN (NITROSTAT) 0.4 MG SL  tablet Place 0.4 mg under the tongue every 5 (five) minutes as needed.        Marland Kitchen PARoxetine (PAXIL) 20 MG tablet Take 20 mg by mouth at bedtime.       . prazosin (MINIPRESS) 1 MG capsule Take 1 capsule (1 mg total) by mouth 2 (two) times daily.  180 capsule  3    Past Medical History  Diagnosis Date  . Diabetes mellitus   . Anxiety disorder   . Arthritis   . Hypertension   . Hyperlipidemia   . Coronary atherosclerosis of native coronary artery     s/p stent placement to the right coronary artery residual mid 70% LAD disease. Cardiolite study 2000 and a low risk. Normal ejection fraction  . Upper abdominal pain   . Heartburn   . Nonspecific elevation of levels of transaminase or lactic acid dehydrogenase (LDH)     Past Surgical History  Procedure Date  . Coronary angioplasty with stent placement   . Total knee arthroplasty   . Rotator cuff repair     Family History  Problem Relation Age of Onset  . Colon cancer Mother     diagnosed age 94  . Heart disease Father   . Breast cancer Sister   . Heart disease Brother     History   Social History  . Marital Status: Married  Spouse Name: N/A    Number of Children: N/A  . Years of Education: N/A   Occupational History  . 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 Smoking status: Former Smoker -- 1.0 packs/day for 25 years    Types: Cigarettes    Quit date: 01/12/1988  . Smokeless tobacco: Former Neurosurgeon    Types: Chew    Quit date: 01/11/1989   Comment: chewed 1 pack tobacco/day for 15 years  . Alcohol Use: Yes     very ware  . Drug Use: No  . Sexually Active: Not on file   Other Topics Concern  . Not on file   Social History Narrative   No regular exerciseDaily caffeine usePrevious exposure to asbestos when working at power plant between [phone]   AOZ:[id number] positives as outlined above. The remainder of the 18  point review of systems is negative  PHYSICAL EXAM BP 149/82  Pulse 78  Ht 5'  6" (1.676 m)  Wt 199 lb (90.266 kg)  BMI 32.12 kg/m2  General: Well-developed, well-nourished in no distress Head: Normocephalic and atraumatic Eyes:PERRLA/EOMI intact, conjunctiva and lids normal Ears: No deformity or lesions Mouth:normal dentition, normal posterior pharynx Neck: Supple, no JVD.  No masses, thyromegaly or abnormal cervical nodes Lungs: Normal breath sounds bilaterally without wheezing.  Normal percussion Cardiac: regular rate and rhythm with normal S1 and S2, no S3 or S4.  PMI is normal.  No pathological murmurs Abdomen: Normal bowel sounds, abdomen is soft and nontender without masses, organomegaly or hernias noted.  No hepatosplenomegaly MSK: Back normal, normal gait muscle strength and tone normal Vascular: Pulse is normal in all 4 extremities Extremities: No peripheral pitting edema Neurologic: Alert and oriented x 3 Skin: Intact without lesions or rashes Lymphatics: No significant adenopathy Psychologic: Normal affect   ECG: Not available  ASSESSMENT AND PLAN

## 2010-10-08 ENCOUNTER — Other Ambulatory Visit: Payer: Self-pay | Admitting: *Deleted

## 2010-10-08 DIAGNOSIS — A9231 West Nile virus infection with encephalitis: Secondary | ICD-10-CM

## 2010-10-14 ENCOUNTER — Other Ambulatory Visit: Payer: Self-pay | Admitting: Internal Medicine

## 2010-10-15 ENCOUNTER — Encounter: Payer: Self-pay | Admitting: Infectious Diseases

## 2010-10-15 ENCOUNTER — Ambulatory Visit (INDEPENDENT_AMBULATORY_CARE_PROVIDER_SITE_OTHER): Payer: Medicare Other | Admitting: Infectious Diseases

## 2010-10-15 VITALS — BP 148/92 | HR 71 | Temp 98.0°F | Ht 66.0 in | Wt 204.0 lb

## 2010-10-15 DIAGNOSIS — G9340 Encephalopathy, unspecified: Secondary | ICD-10-CM

## 2010-10-15 MED ORDER — FAMOTIDINE 20 MG PO TABS: ORAL_TABLET | ORAL | Status: DC

## 2010-10-15 NOTE — Progress Notes (Signed)
  Subjective:    Patient ID: [name], male    DOB: October 30, 1940, 70 y.o.   MRN: [id number]  HPI A 70 year old male with a history of hospitalization at [facility] on 09/11/2010 at that time he was felt to have encephalopathy. He reported in a ventilation. He underwent a lumbar puncture which showed an IgG positive for West Nile virus and IgM that was negative. He had repeat serologies sent on September 7 at work negative for [employer] IgG as well as IgM. His Western  Equine, [place] equine, as well as [place] encephalitis  virus antibodies were all nonspecific. He was treated with ceftriaxone. I do not have records of any other spinal fluid tests that were done (protein, glucose, gram stain, cell count). He was d/c home on 09-15-10.  He feels better now. States that his syndrome was similar to headaches that he has had before. No fever or chills. His recent illness was preceded by lithotripsy on 09-08-10. He was given toradol. His UCx was no growth but his urine looked abn per his wife.    Review of Systems  Constitutional: Negative for fever and chills.  HENT: Negative for neck stiffness.   Respiratory: Negative for shortness of breath.   Gastrointestinal: Negative for nausea, diarrhea and constipation.  Genitourinary: Negative for dysuria and hematuria.  Neurological: Negative for dizziness, weakness, light-headedness, numbness and headaches.       Objective:   Physical Exam  Constitutional: He appears well-developed and well-nourished.  Eyes: EOM are normal. Pupils are equal, round, and reactive to light.  Neck: Neck supple.  Cardiovascular: Normal rate, regular rhythm and normal heart sounds.   Pulmonary/Chest: Effort normal and breath sounds normal. He has no wheezes. He has no rales.  Abdominal: Soft. Bowel sounds are normal. There is no tenderness. There is no rebound and no CVA tenderness.  Lymphadenopathy:    He has no cervical adenopathy.  Neurological: He  has normal strength. No sensory deficit. Coordination normal.          Assessment & Plan:

## 2010-10-15 NOTE — Assessment & Plan Note (Signed)
Patient is currently completely neurologically intact. He has some residual headaches. I spoke with patient and his wife about his IgG (being positive and then negative), and his IgM being negative. i am doubtful that he had West Nile Virus encephalitis. I believe that he may have had urosepsis due to his previous procedure. However, this is only supposition and I did not see him when he was admitted and his records are incomplete. With the labs that i do have, his WNV serologies, these are not consistent with him having acute WNV disease. He will Return to clinic Call or return to clinic prn if these symptoms worsen or fail to improve as anticipated. He has neurology f/u at [facility].

## 2010-10-15 NOTE — Telephone Encounter (Signed)
Medication given for 30 day supply until patient's appointment.

## 2010-10-16 LAB — GLUCOSE, CAPILLARY: Glucose-Capillary: 176 mg/dL — ABNORMAL HIGH (ref 70–99)

## 2010-10-21 ENCOUNTER — Telehealth: Payer: Self-pay | Admitting: *Deleted

## 2010-10-21 NOTE — Telephone Encounter (Signed)
Message copied by Murriel Hopper on Wed Oct 21, 2010 11:35 AM ------      Message from: Lewayne Bunting E      Created: Wed Oct 21, 2010  6:36 AM       No need for stress test currently            ----- Message -----         From: Hoover Brunette, LPN         Sent: 09/30/2010  11:53 AM           To: Peyton Bottoms, MD            Reminder - need to review chart to see if he needs stress test.  May have done this already since you have closed his dictation.              Thank.      Inocencio Homes

## 2010-11-13 ENCOUNTER — Ambulatory Visit (INDEPENDENT_AMBULATORY_CARE_PROVIDER_SITE_OTHER): Payer: Medicare Other | Admitting: Internal Medicine

## 2010-11-13 ENCOUNTER — Encounter: Payer: Self-pay | Admitting: Internal Medicine

## 2010-11-13 VITALS — BP 140/85 | HR 78 | Ht 66.0 in | Wt 108.6 lb

## 2010-11-13 DIAGNOSIS — K219 Gastro-esophageal reflux disease without esophagitis: Secondary | ICD-10-CM

## 2010-11-13 DIAGNOSIS — K5900 Constipation, unspecified: Secondary | ICD-10-CM

## 2010-11-13 MED ORDER — POLYETHYLENE GLYCOL 3350 17 GM/SCOOP PO POWD: ORAL | Status: DC

## 2010-11-13 MED ORDER — FAMOTIDINE 20 MG PO TABS: ORAL_TABLET | ORAL | Status: DC

## 2010-11-13 NOTE — Progress Notes (Signed)
Subjective:    Patient ID: [name], male    DOB: March 28, 1940, 70 y.o.   MRN: [id number]  HPI This man has been seen by me in the past for colonoscopy as well as GERD. He would like a refill on his famotidine 20 mg twice daily which controls his heartburn problems. He has also developed constipation problem since starting the toes. He and his wife had discovered that if he uses MiraLax I will relieve the constipation and are asking if that is an acceptable long-term treatment.  Past Medical History  Diagnosis Date  . Diabetes mellitus   . Anxiety disorder   . Arthritis   . Hypertension   . Hyperlipidemia   . Coronary atherosclerosis of native coronary artery     s/p stent placement to the right coronary artery residual mid 70% LAD disease. Cardiolite study 2000 and a low risk. Normal ejection fraction  . Acute encephalopathy 08/2010    ? West Nile Virus  . Adenomatous polyp   . Gallbladder cholesterolosis   . Diverticulosis   . Internal hemorrhoids   . Nephrolithiasis   . Chronic headaches    Past Surgical History  Procedure Date  . Coronary angioplasty with stent placement   . Total knee arthroplasty   . Rotator cuff repair   . Joint replacement   . Upper gastrointestinal endoscopy 01/08/2008    bx, inlet patch, duodenitis  . Colonoscopy 09/10/2008    normal  . Cataract extraction   . Cystoscopy w/ ureteroscopy w/ lithotripsy 08/2010    reports that he quit smoking about 22 years ago. His smoking use included Cigarettes. He has a 25 pack-year smoking history. He quit smokeless tobacco use about 21 years ago. His smokeless tobacco use included Chew. He reports that he drinks alcohol. He reports that he does not use illicit drugs. family history includes Breast cancer in his sister; Colon cancer in his mother; and Heart disease in his brother and father. Allergies  Allergen Reactions  . Ace Inhibitors   . Angiotensin Receptor Blockers   . Ciprofloxacin   . Doxycycline    . Oxycodone-Acetaminophen     REACTION: unknown reaction  . Ramipril   . Toradol   . Valium       Outpatient Prescriptions Prior to Visit  Medication Sig Dispense Refill  . amLODipine (NORVASC) 5 MG tablet Take 1 tablet (5 mg total) by mouth daily.  90 tablet  3  . aspirin 325 MG tablet Take 325 mg by mouth daily.        Marland Kitchen atorvastatin (LIPITOR) 20 MG tablet Take 20 mg by mouth daily.        . cetirizine (ZYRTEC) 10 MG tablet Take 10 mg by mouth daily.        . Cholecalciferol (VITAMIN D3) 2000 UNITS TABS Take 1 tablet by mouth daily.        . insulin glargine (LANTUS) 100 UNIT/ML injection Inject 50 Units into the skin at bedtime.       . Liraglutide (VICTOZA) 18 MG/3ML SOLN Inject 1.8 mg into the skin daily before supper.        . metFORMIN (GLUCOPHAGE) 500 MG tablet Take 1,000 mg by mouth 2 (two) times daily with a meal.        . nitroGLYCERIN (NITROSTAT) 0.4 MG SL tablet Place 0.4 mg under the tongue every 5 (five) minutes as needed.        Marland Kitchen PARoxetine (PAXIL) 20 MG tablet Take 20 mg  by mouth at bedtime.       . prazosin (MINIPRESS) 1 MG capsule Take 1 capsule (1 mg total) by mouth 2 (two) times daily.  180 capsule  3  . famotidine (PEPCID) 20 MG tablet Take 1 tablet by mouth twice a day 30 minutes before breakfast and supper  60 tablet  0     Review of Systems In August he had kidney stones requiring ureteroscopy and lithotripsy. Subsequent to that he had headache and encephalopathy thought possibly due to [place] Nile virus. He has seen ID and is following up with neurology.    Objective:   Physical Exam Overweight to obese no acute distress elderly white man       Assessment & Plan:  Constipation This seems likely due to the Victoza. He will use MiraLax to control this.

## 2010-11-13 NOTE — Patient Instructions (Signed)
Your prescription(s) has(have) been sent to your pharmacy for you to pick up (Famotidine). Future refills should be received through your primary care physician. You have been told to start Miralax as seen on your medication list.

## 2010-11-13 NOTE — Assessment & Plan Note (Signed)
We'll refill famotidine today. He has done well with this. In the future he can obtain these refills through primary care.

## 2010-11-18 ENCOUNTER — Other Ambulatory Visit: Payer: Self-pay | Admitting: *Deleted

## 2010-11-18 MED ORDER — AMLODIPINE BESYLATE 5 MG PO TABS: 5.0000 mg | ORAL_TABLET | Freq: Every day | ORAL | Status: DC

## 2010-11-23 ENCOUNTER — Other Ambulatory Visit: Payer: Self-pay | Admitting: *Deleted

## 2010-11-23 MED ORDER — PRAZOSIN HCL 1 MG PO CAPS: 1.0000 mg | ORAL_CAPSULE | Freq: Two times a day (BID) | ORAL | Status: DC

## 2011-01-07 ENCOUNTER — Other Ambulatory Visit: Payer: Self-pay | Admitting: Cardiology

## 2011-01-21 ENCOUNTER — Ambulatory Visit: Payer: Medicare Other | Admitting: Cardiology

## 2011-02-18 ENCOUNTER — Encounter: Payer: Self-pay | Admitting: Cardiology

## 2011-02-18 ENCOUNTER — Ambulatory Visit (INDEPENDENT_AMBULATORY_CARE_PROVIDER_SITE_OTHER): Payer: Medicare Other | Admitting: Cardiology

## 2011-02-18 VITALS — BP 151/84 | HR 80 | Ht 66.0 in | Wt 219.0 lb

## 2011-02-18 DIAGNOSIS — I2510 Atherosclerotic heart disease of native coronary artery without angina pectoris: Secondary | ICD-10-CM

## 2011-02-18 DIAGNOSIS — R079 Chest pain, unspecified: Secondary | ICD-10-CM

## 2011-02-18 MED ORDER — CHLORTHALIDONE 25 MG PO TABS: 25.0000 mg | ORAL_TABLET | Freq: Every day | ORAL | Status: DC

## 2011-02-18 NOTE — Progress Notes (Signed)
[name] Bottoms, MD, [facility] ABIM Board Certified in Adult Cardiovascular Medicine,Internal Medicine and Critical Care Medicine    CC: Followup patient coronary artery disease  HPI:  The patient is a 71 year old male with a history of coronary artery disease status post prior coronary intervention. He reports no recurrent chest pain. He had a Cardiolite study done in 2000 and which was negative for ischemia with an ejection fraction of 55%. Also a bedside echo was done during his last office visit which was within normal limits. From a cardiac standpoint the patient is doing quite well. He reports no chest pain orthopnea PND palpitations or syncope. He reports of pain in the ring finger of his right hand. Pain is extending into his ring finger.  PMH: reviewed and listed in Problem List in Electronic Records (and see below) Past Medical History  Diagnosis Date  . Diabetes mellitus   . Anxiety disorder   . Arthritis   . Hypertension   . Hyperlipidemia   . Coronary atherosclerosis of native coronary artery     s/p stent placement to the right coronary artery residual mid 70% LAD disease. Cardiolite study 2010 low risk. Normal ejection fraction  . Acute encephalopathy 08/2010    ? West Nile Virus-final diagnosis was not consistent with Chad Nile virus.  . Adenomatous polyp   . Gallbladder cholesterolosis   . Diverticulosis   . Internal hemorrhoids   . Nephrolithiasis   . Chronic headaches    Past Surgical History  Procedure Date  . Coronary angioplasty with stent placement   . Total knee arthroplasty   . Rotator cuff repair   . Joint replacement   . Upper gastrointestinal endoscopy 01/08/2008    bx, inlet patch, duodenitis  . Colonoscopy 09/10/2008    normal  . Cataract extraction   . Cystoscopy w/ ureteroscopy w/ lithotripsy 08/2010    Allergies/SH/FHX : available in Electronic Records for review  Allergies  Allergen Reactions  . Ace Inhibitors   . Angiotensin Receptor  Blockers   . Ciprofloxacin   . Doxycycline   . Oxycodone-Acetaminophen     REACTION: unknown reaction  . Ramipril   . Toradol   . Valium    History   Social History  . Marital Status: Married    Spouse Name: N/A    Number of Children: 0  . Years of Education: N/A   Occupational History  . 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 Smoking status: Former Smoker -- 1.0 packs/day for 25 years    Types: Cigarettes    Quit date: 08/11/1988  . Smokeless tobacco: Former Neurosurgeon    Types: Chew    Quit date: 01/11/1989   Comment: chewed 1 pack tobacco/day for 15 years  . Alcohol Use: Yes     very rarely  . Drug Use: No  . Sexually Active: Not on file   Other Topics Concern  . Not on file   Social History Narrative   No regular exerciseDaily caffeine usePrevious exposure to asbestos when working at power plant between [id number]   Family History  Problem Relation Age of Onset  . Colon cancer Mother     diagnosed age 13  . Heart disease Father   . Breast cancer Sister   . Heart disease Brother     Medications: Current Outpatient Prescriptions  Medication Sig Dispense Refill  . amLODipine (NORVASC) 5 MG tablet Take 1 tablet (5 mg total) by mouth daily.  90 tablet  3  . aspirin 325 MG tablet Take 325 mg by mouth daily.        Marland Kitchen atorvastatin (LIPITOR) 20 MG tablet Take 20 mg by mouth daily.        . B-D ULTRAFINE III SHORT PEN 31G X 8 MM MISC As direct      . butalbital-acetaminophen-caffeine (FIORICET WITH CODEINE) 50-325-40-30 MG per capsule Take 1 capsule by mouth every 4 (four) hours as needed.        . cetirizine (ZYRTEC) 10 MG tablet Take 10 mg by mouth daily.        . Cholecalciferol (VITAMIN D3) 2000 UNITS TABS Take 1 tablet by mouth daily.        . famotidine (PEPCID) 20 MG tablet Take 1 tablet by mouth twice a day 30 minutes before breakfast and supper  180 tablet  3  . insulin glargine (LANTUS) 100 UNIT/ML injection Inject 50 Units into the skin at  bedtime.       . Liraglutide (VICTOZA) 18 MG/3ML SOLN Inject 1.8 mg into the skin daily before supper.        . metFORMIN (GLUCOPHAGE) 500 MG tablet Take 1,000 mg by mouth 2 (two) times daily with a meal.        . nitroGLYCERIN (NITROSTAT) 0.4 MG SL tablet Place 0.4 mg under the tongue every 5 (five) minutes as needed.        Marland Kitchen NOVOLOG FLEXPEN 100 UNIT/ML injection Sliding scale      . PARoxetine (PAXIL) 20 MG tablet Take 20 mg by mouth at bedtime.       . polyethylene glycol powder (MIRALAX) powder 1/2-1 dose daily to relieve constipation from Victoza.  255 g  0  . prazosin (MINIPRESS) 1 MG capsule Take 1 capsule (1 mg total) by mouth 2 (two) times daily.  180 capsule  6  . chlorthalidone (HYGROTON) 25 MG tablet Take 1 tablet (25 mg total) by mouth daily.  30 tablet  6    ROS: No nausea or vomiting. No fever or chills.No melena or hematochezia.No bleeding.No claudication.  Physical Exam: BP 151/84  Pulse 80  Ht 5\' 6"  (1.676 m)  Wt 219 lb (99.338 kg)  BMI 35.35 kg/m2 General: Well-nourished white male in no apparent distress Neck: Normal carotid upstroke no carotid bruits. No thyromegaly nonnodular thyroid. JVP is 5-6 cm Lungs: Clear breath sounds bilaterally. No wheezing Cardiac: Regular rate and rhythm with normal S1-S2 no murmur rubs or gallops Vascular: No edema. Normal distal pulses Skin: Warm and dry Physcologic: Normal affect  12lead ECG: Normal sinus rhythm nonspecific T-wave changes Limited bedside ECHO:N/A   Patient Active Problem List  Diagnoses  . HYPERLIPIDEMIA-MIXED  . HYPERTENSION, UNSPECIFIED-poorly controlled   . CORONARY ATHEROSCLEROSIS NATIVE CORONARY ARTERY  . ASBESTOSIS  . CHEST PAIN-resolved   . GERD (gastroesophageal reflux disease)  . ABNORMAL TRANSAMINASE-LFT'S  . COLONIC POLYPS, ADENOMATOUS, HX OF  . Encephalopathy acute    PLAN   From a cardiac standpoint the patient is doing well. He reports no chest pain. He had a Cardiolite in 2000 and and  is not due for any further screening.  Lipid panel and LFTs can be followed by his primary care physician.  Blood pressure however is poorly controlled despite to vasodilators. We'll start the patient on chlorthalidone 25 mg by mouth daily.

## 2011-02-18 NOTE — Patient Instructions (Signed)
   Begin Chlorthalidone 25mg daily Your physician wants you to follow up in: 6 months.  You will receive a reminder letter in the mail one-two months in advance.  If you don't receive a letter, please call our office to schedule the follow up appointment  

## 2011-02-19 ENCOUNTER — Other Ambulatory Visit: Payer: Self-pay | Admitting: *Deleted

## 2011-02-19 MED ORDER — CHLORTHALIDONE 25 MG PO TABS: 25.0000 mg | ORAL_TABLET | Freq: Every day | ORAL | Status: DC

## 2011-05-10 ENCOUNTER — Ambulatory Visit (INDEPENDENT_AMBULATORY_CARE_PROVIDER_SITE_OTHER): Payer: Worker's Compensation | Admitting: Internal Medicine

## 2011-05-10 DIAGNOSIS — J61 Pneumoconiosis due to asbestos and other mineral fibers: Secondary | ICD-10-CM

## 2011-05-10 LAB — PULMONARY FUNCTION TEST

## 2011-05-10 NOTE — Progress Notes (Signed)
PFT done today. 

## 2011-05-13 ENCOUNTER — Inpatient Hospital Stay (HOSPITAL_COMMUNITY)
Admission: AD | Admit: 2011-05-13 | Discharge: 2011-05-14 | DRG: 287 | Disposition: A | Discharge status: Home / Self Care | Payer: Medicare Other | Source: Other Acute Inpatient Hospital | Attending: Cardiology | Admitting: Cardiology

## 2011-05-13 ENCOUNTER — Other Ambulatory Visit: Payer: Self-pay | Admitting: Cardiology

## 2011-05-13 ENCOUNTER — Encounter (HOSPITAL_COMMUNITY): Payer: Self-pay | Admitting: General Practice

## 2011-05-13 DIAGNOSIS — I2510 Atherosclerotic heart disease of native coronary artery without angina pectoris: Principal | ICD-10-CM | POA: Diagnosis present

## 2011-05-13 DIAGNOSIS — Z96659 Presence of unspecified artificial knee joint: Secondary | ICD-10-CM

## 2011-05-13 DIAGNOSIS — R51 Headache: Secondary | ICD-10-CM | POA: Diagnosis present

## 2011-05-13 DIAGNOSIS — R079 Chest pain, unspecified: Secondary | ICD-10-CM

## 2011-05-13 DIAGNOSIS — K829 Disease of gallbladder, unspecified: Secondary | ICD-10-CM | POA: Diagnosis present

## 2011-05-13 DIAGNOSIS — E785 Hyperlipidemia, unspecified: Secondary | ICD-10-CM | POA: Diagnosis present

## 2011-05-13 DIAGNOSIS — I252 Old myocardial infarction: Secondary | ICD-10-CM

## 2011-05-13 DIAGNOSIS — I200 Unstable angina: Secondary | ICD-10-CM

## 2011-05-13 DIAGNOSIS — Z87891 Personal history of nicotine dependence: Secondary | ICD-10-CM

## 2011-05-13 DIAGNOSIS — K219 Gastro-esophageal reflux disease without esophagitis: Secondary | ICD-10-CM | POA: Diagnosis present

## 2011-05-13 DIAGNOSIS — E119 Type 2 diabetes mellitus without complications: Secondary | ICD-10-CM | POA: Diagnosis present

## 2011-05-13 DIAGNOSIS — E876 Hypokalemia: Secondary | ICD-10-CM | POA: Diagnosis present

## 2011-05-13 HISTORY — DX: Gastro-esophageal reflux disease without esophagitis: K21.9

## 2011-05-13 LAB — CBC
Hemoglobin: 13.6 g/dL (ref 13.0–17.0)
MCHC: 33.9 g/dL (ref 30.0–36.0)
RDW: 13.8 % (ref 11.5–15.5)

## 2011-05-13 LAB — CREATININE, SERUM
Creatinine, Ser: 0.77 mg/dL (ref 0.50–1.35)
GFR calc non Af Amer: 89 mL/min — ABNORMAL LOW (ref >90)

## 2011-05-13 LAB — MAGNESIUM: Magnesium: 1.8 mg/dL (ref 1.5–2.5)

## 2011-05-13 LAB — GLUCOSE, CAPILLARY: Glucose-Capillary: 154 mg/dL — ABNORMAL HIGH (ref 70–99)

## 2011-05-13 LAB — TSH: TSH: 0.546 u[IU]/mL (ref 0.350–4.500)

## 2011-05-13 MED ORDER — ASPIRIN EC 81 MG PO TBEC: 81.0000 mg | DELAYED_RELEASE_TABLET | Freq: Every day | ORAL | Status: DC

## 2011-05-13 MED ORDER — ONDANSETRON HCL 4 MG/2ML IJ SOLN: 4.0000 mg | Freq: Four times a day (QID) | INTRAMUSCULAR | Status: DC | PRN

## 2011-05-13 MED ORDER — INSULIN ASPART 100 UNIT/ML ~~LOC~~ SOLN
0.0000 [IU] | Freq: Three times a day (TID) | SUBCUTANEOUS | Status: DC
  Administered 2011-05-13: 3 [IU] via SUBCUTANEOUS

## 2011-05-13 MED ORDER — NITROGLYCERIN 0.4 MG SL SUBL: 0.4000 mg | SUBLINGUAL_TABLET | SUBLINGUAL | Status: DC | PRN

## 2011-05-13 MED ORDER — ASPIRIN 81 MG PO CHEW
324.0000 mg | CHEWABLE_TABLET | ORAL | Status: AC
  Administered 2011-05-13: 324 mg via ORAL

## 2011-05-13 MED ORDER — INSULIN ASPART 100 UNIT/ML ~~LOC~~ SOLN
0.0000 [IU] | Freq: Every day | SUBCUTANEOUS | Status: DC
  Administered 2011-05-13: 4 [IU] via SUBCUTANEOUS

## 2011-05-13 MED ORDER — ASPIRIN 300 MG RE SUPP
300.0000 mg | RECTAL | Status: AC
Start: 2011-05-13 — End: 2011-05-13
  Filled 2011-05-13: qty 1

## 2011-05-13 MED ORDER — HEPARIN SODIUM (PORCINE) 5000 UNIT/ML IJ SOLN
5000.0000 [IU] | Freq: Three times a day (TID) | INTRAMUSCULAR | Status: DC
  Administered 2011-05-13: 5000 [IU] via SUBCUTANEOUS
  Filled 2011-05-13 (×5): qty 1

## 2011-05-13 MED ORDER — ASPIRIN 81 MG PO CHEW
324.0000 mg | CHEWABLE_TABLET | ORAL | Status: AC
  Administered 2011-05-14: 324 mg via ORAL
  Filled 2011-05-13 (×2): qty 4

## 2011-05-13 MED ORDER — SODIUM CHLORIDE 0.45 % IV SOLN
INTRAVENOUS | Status: DC
  Administered 2011-05-13: 16:00:00 via INTRAVENOUS

## 2011-05-13 MED ORDER — SODIUM CHLORIDE 0.9 % IJ SOLN: 3.0000 mL | INTRAMUSCULAR | Status: DC | PRN

## 2011-05-13 MED ORDER — SODIUM CHLORIDE 0.9 % IV SOLN: 250.0000 mL | INTRAVENOUS | Status: DC | PRN

## 2011-05-13 MED ORDER — NITROGLYCERIN 2 % TD OINT
1.0000 [in_us] | TOPICAL_OINTMENT | Freq: Four times a day (QID) | TRANSDERMAL | Status: DC
  Administered 2011-05-13: 1 [in_us] via TOPICAL
  Filled 2011-05-13: qty 30

## 2011-05-13 MED ORDER — SODIUM CHLORIDE 0.9 % IJ SOLN
3.0000 mL | Freq: Two times a day (BID) | INTRAMUSCULAR | Status: DC
  Administered 2011-05-13: 3 mL via INTRAVENOUS

## 2011-05-13 MED ORDER — ACETAMINOPHEN 325 MG PO TABS
650.0000 mg | ORAL_TABLET | ORAL | Status: DC | PRN
  Administered 2011-05-13: 650 mg via ORAL
  Filled 2011-05-13: qty 2

## 2011-05-13 NOTE — H&P (Signed)
NAME:  Scroggins, [name] ROOM: 230  UNIT NUMBER:  [id number] LOCATION: 2F 230 01 ADM/VISIT DATE:  05/13/2011   ADM PHYS:  ACCT:  [id number] DOB: 05/23/1940   REFERRING PHYSICIAN:  Donald Moore, MD  REASON FOR ADMISSION:  Substernal chest pain.  HISTORY OF PRESENT ILLNESS:  The patient is a 71-year-old male with a history of coronary artery disease.  The patient was awoken at 4:30 this morning out of his sleep with sudden onset of substernal chest pain, which he rated a 5/10.  He stated that there was heavy chest pressure associated with diaphoresis and shortness of breath.  The pain did not radiate to the neck or to the arms.  Immediately after this, he became quite nauseated and had to vomit.  His wife called EMS and the patient was given 4 baby aspirins and placed on oxygen therapy.  Apparently he rapidly improved, although he was not given any sublingual nitroglycerin.  By the time he got to the ER, he had only mild substernal chest pain, which ultimately resolved with sublingual nitroglycerin.  Admission EKG showed no acute myocardial infarction pattern.  His first set of troponins were also within normal limits; however, the patient, as outlined above, does have significant coronary artery disease with prior stenting to the right coronary artery in 2006 and a residual 70% mid LAD lesion.  The patient reports chronic constipation.  In addition, the patient has experienced some weakness and increased shortness of breath over the last several weeks.  He denies any orthopnea or any PND.  Currently he is in no discomfort.  He is not complaining of any chest pain or abdominal pain.  PAST MEDICAL HISTORY: 1. Diabetes mellitus. 2. Anxiety disorder. 3. Arthritis. 4. Hypertension. 5. Hyperlipidemia. 6. Coronary atherosclerosis of native coronary artery. A. Status post stent placement to the right coronary artery with residual mid 70% LAD disease.  Cardiolite study 2010 was low risk with normal ejection  fraction.  Acute encephalopathy 08/2010 with a presumptive diagnosis of West Nile virus but followup serology was not consistent with that.  The patient did get a lumbar puncture. 7. Adenomatous polyp. 8. Gallbladder disease.  The patient still has his gallbladder in place. 9. Diverticulosis. 10. Internal hemorrhoids. 11. Nephrolithiasis. 12. Chronic headaches.  PAST SURGICAL HISTORY: 1. Coronary angioplasty with stent placement. 2. Total knee arthroplasty. 3. Rotator cuff repair, joint replacement. 4. Upper GI endoscopy with biopsy, inlet patch, duodenitis 01/08/2008. 5. Colonoscopy 09/10/2008, normal. 6. Cataract extraction. 7. Cystoscopy with ureteroscopy with lithotripsy 08/2010.  ALLERGIES:  ACE INHIBITORS, ANGIOTENSIN RECEPTORS BLOCKERS, CIPROFLOXACIN, DOXYCYCLINE, OXYCODONE, ACETAMINOPHEN with an unknown reaction, RAMIPRIL, TORADOL, and VALIUM.  HISTORY:  The patient is married.  He is a former smoker with 1 pack a day for 25 years.  He stopped smoking in 01/1989.  He is a retired power plant operator.  FAMILY HISTORY:  Colon cancer in mother diagnosed at age 59, heart disease in father, breast cancer in sister, and heart disease in brother.  OUTPATIENT MEDICATIONS: 1. Amlodipine 5 mg daily. 2. Aspirin 325 mg p.o. daily. 3. Lipitor 20 mg p.o. daily. 4. BD Ultra Fine III Short Pen 31G x 8 mm. 5. Fioricet with codeine 50/325 as needed. 6. Zyrtec 10 mg p.o. daily. 7. Vitamin D3, 2000 units daily. 8. Pepcid 20 mg p.o. daily. 9. Lantus insulin 100 units with 50 units at bedtime. 10. Victoza 1.8 mg subcu before supper. 11. Glucophage 1000 mg twice a day. 12. Nitroglycerin 0.4 mg sublingual as needed.   13. Novolog FlexPen sliding scale. 14. Paxil 20 mg p.o. daily. 15. MiraLax powder as needed. 16. Minipress 1 mg 2 times daily. 17. Chlorthalidone 25 mg p.o. daily.  REVIEW OF SYSTEMS:  Nausea and vomiting as outlined above x1.  No fever or chills.  The patient is concerned  about urinary tract infection, but he reports no dysuria or frequency.  He thought that he may have some cloudy urine.  He reports also no fever or chills.  He has no melena or hematochezia.  No abdominal pain, short of chronic constipation.  He has no bleeding.  No claudication.  No visual changes.  No flank pain.  Neurological:  The patient reports no symptoms, including no headaches.  The remainder of the 18-point review of systems is negative.  PHYSICAL EXAMINATION:  Vital signs:  Blood pressure is 150/80, heart rate is 80 beats per minute, saturation is 99% on 2 L oxygen.  General:  Well-nourished, somewhat pale-appearing white male, feeling uncomfortable but without any chest pain.  HEENT:  Pupils isochoric, NCAT, PERRLA.  JVP is normal at 5-6 cm.  Normal carotid upstroke and no carotid bruits.  No thyromegaly.  Non-nodular thyroid.  Oropharynx is clear.  The patient has upper dentures.  Neck is supple.  Lungs:  Clear, breath sounds bilaterally without any wheezing.  Heart:  Regular rate and rhythm with normal S1, S2.  No murmur, rubs or gallops.  Vascular:  No edema.  Normal distal pulses bilaterally.  Abdomen:  Soft and nontender with good bowel sounds.  There is no Murphy sign or any tenderness in any of the quadrants.  There is also no CVA tenderness.  Skin:  Warm and dry.  Psychologic:  Normal affect.  Twelve-lead EKG:  Normal sinus rhythm with no acute ischemic changes.  Chest x-ray reportedly within normal limits.  LABORATORY WORK:  CBC:  White count is 7100, hemoglobin 13.9, hematocrit 42.9, MCV is 88.2, platelet count is 189,000.  BMET:  Glucose 199, BUN is 17, creatinine is 0.94.  Calcium is 8.7.  Sodium 137, potassium 3.1, CO2 is 25.  Urine specimen was not obtained.  Cardiac enzymes:  First set:  CK 104, CK-MB 4.4, index 4.2, troponin less than 0.01.  Second set was drawn and is pending.  PROBLEM LIST: 1. Substernal chest pain suggestive of acute coronary syndrome. 2. Coronary artery  disease with stent placement to the right coronary artery and residual 70% LAD disease in 2006. A. Low-risk Cardiolite 2010. B. No catheterization after 2006. 3. Hypokalemia. 4. Rule out urinary tract infection. 5. Hyperlipidemia. 6. Hypertension. 7. Asbestosis. 8. GERD. 9. Abnormal transaminases. 10. Adenomatous colonic polyps. 11. History of acute encephalitis.  PLAN: 1. The patient's chest pain, shortness of breath and diaphoresis are reminiscent of his prior presentation in 2006.  He has no acute EKG changes but at the time his chest pain was resolved.  He also has had no further nausea or vomiting, which happened after he developed substernal chest pain.  His first set of enzymes is negative, although his CK-MB index is positive. 2. The patient will be transferred to [facility] for a diagnostic catheterization.  His last catheterization was in 2006.  Glucophage needs to be held and we will hydrate the patient.  He can also be started on intravenous heparin, but would hold off on Plavix. 3. Potassium is 3.1 and the patient will be supplemented with potassium prior to his cardiac catheterization. 4. I discussed the risks and benefits of a   diagnostic catheterization and possible coronary intervention with the patient and he is willing to proceed. 5. Of note, although patient's with nausea and vomiting, his abdominal exam is rather benign.  He still has his gallbladder, but there is no right upper quadrant tenderness.  Nevertheless, I ordered an amylase and lipase that needs to be evaluated prior to cardiac catheterization.  We will also need a urine specimen; that needs to be evaluated prior to the cardiac catheterization.   __________________________    Gizell Danser, M.D. /landm D: 05/13/2011 1218 T: 05/13/2011 1244 P: DEG  cc:  Donald Moore, M.D. JAMES PARSONS, M.D.   

## 2011-05-14 ENCOUNTER — Ambulatory Visit (HOSPITAL_COMMUNITY): Admit: 2011-05-14 | Payer: Self-pay | Admitting: Cardiology

## 2011-05-14 ENCOUNTER — Encounter (HOSPITAL_COMMUNITY)
Admission: AD | Disposition: A | Discharge status: Home / Self Care | Payer: Self-pay | Source: Other Acute Inpatient Hospital | Attending: Cardiology

## 2011-05-14 DIAGNOSIS — I2510 Atherosclerotic heart disease of native coronary artery without angina pectoris: Secondary | ICD-10-CM

## 2011-05-14 HISTORY — PX: LEFT HEART CATHETERIZATION WITH CORONARY ANGIOGRAM: SHX5451

## 2011-05-14 LAB — BASIC METABOLIC PANEL
BUN: 18 mg/dL (ref 6–23)
Calcium: 8.9 mg/dL (ref 8.4–10.5)
Creatinine, Ser: 0.87 mg/dL (ref 0.50–1.35)
GFR calc non Af Amer: 85 mL/min — ABNORMAL LOW (ref >90)
Glucose, Bld: 219 mg/dL — ABNORMAL HIGH (ref 70–99)
Sodium: 137 mEq/L (ref 135–145)

## 2011-05-14 LAB — CBC
MCH: 29.4 pg (ref 26.0–34.0)
MCHC: 33.8 g/dL (ref 30.0–36.0)
Platelets: 207 10*3/uL (ref 150–400)
RDW: 13.8 % (ref 11.5–15.5)

## 2011-05-14 LAB — GLUCOSE, CAPILLARY
Glucose-Capillary: 273 mg/dL — ABNORMAL HIGH (ref 70–99)
Glucose-Capillary: 316 mg/dL — ABNORMAL HIGH (ref 70–99)
Glucose-Capillary: 216 mg/dL — ABNORMAL HIGH (ref 70–99)

## 2011-05-14 SURGERY — LEFT HEART CATHETERIZATION WITH CORONARY ANGIOGRAM
Anesthesia: LOCAL

## 2011-05-14 MED ORDER — METFORMIN HCL ER (MOD) 500 MG PO TB24: 1000.0000 mg | ORAL_TABLET | Freq: Two times a day (BID) | ORAL | Status: DC

## 2011-05-14 MED ORDER — ACETAMINOPHEN 325 MG PO TABS: 650.0000 mg | ORAL_TABLET | ORAL | Status: DC | PRN

## 2011-05-14 MED ORDER — LIDOCAINE HCL (PF) 1 % IJ SOLN
INTRAMUSCULAR | Status: AC
  Filled 2011-05-14: qty 30

## 2011-05-14 MED ORDER — NITROGLYCERIN 0.2 MG/ML ON CALL CATH LAB
INTRAVENOUS | Status: AC
  Filled 2011-05-14: qty 1

## 2011-05-14 MED ORDER — ONDANSETRON HCL 4 MG/2ML IJ SOLN: 4.0000 mg | Freq: Four times a day (QID) | INTRAMUSCULAR | Status: DC | PRN

## 2011-05-14 MED ORDER — MIDAZOLAM HCL 2 MG/2ML IJ SOLN
INTRAMUSCULAR | Status: AC
  Filled 2011-05-14: qty 2

## 2011-05-14 MED ORDER — HEPARIN (PORCINE) IN NACL 2-0.9 UNIT/ML-% IJ SOLN
INTRAMUSCULAR | Status: AC
  Filled 2011-05-14: qty 2000

## 2011-05-14 MED ORDER — FENTANYL CITRATE 0.05 MG/ML IJ SOLN
INTRAMUSCULAR | Status: AC
  Filled 2011-05-14: qty 2

## 2011-05-14 NOTE — Discharge Summary (Signed)
CARDIOLOGY DISCHARGE SUMMARY   Patient ID: [name] MRN: [id number] DOB/AGE: March 13, 1940 71 y.o.  Admit date: 05/13/2011 Discharge date: 05/14/2011  Primary Discharge Diagnosis:  Chest pain  Secondary Discharge Diagnosis:  Past Medical History  Diagnosis Date  . Diabetes mellitus   . Anxiety disorder   . Arthritis   . Hypertension   . Hyperlipidemia   . Coronary atherosclerosis of native coronary artery     s/p stent placement to the right coronary artery residual mid 70% LAD disease. Cardiolite study 2010 low risk. Normal ejection fraction  . Acute encephalopathy 08/2010    ? West Nile Virus-final diagnosis was not consistent with Chad Nile virus.  . Adenomatous polyp   . Gallbladder cholesterolosis   . Diverticulosis   . Internal hemorrhoids   . Nephrolithiasis   . Chronic headaches   . Myocardial infarction     2006  . Headache   . GERD (gastroesophageal reflux disease)    Procedures: Catheter placement for coronary angiography, Selective Coronary Angiography  Hospital Course: [name] is a 71 year old male with a history of CAD. He had chest pain and went to [facility]. His pain was relieved by sublingual NTG. He was evaluated there by Dr Andee Lineman. His symptoms were concerning for USAP and increasing DOE. He was transferred to [facility] for further evaluation and cardiac catheterization.  [name] went to the cath lab on 05/14/2011. Results are listed below. The films were reviewed by Dr Excell Seltzer who felt medical therapy was the best option. Post-cath on 05/14/2011, [name] was ambulating without chest pain or SOB and considered stable for discharge, to follow up in [place].  Labs:   Lab Results  Component Value Date   WBC 7.2 05/14/2011   HGB 14.1 05/14/2011   HCT 41.7 05/14/2011   MCV 87.1 05/14/2011   PLT 207 05/14/2011    Lab 05/14/11 0550  NA 137  K 3.9  CL 101  CO2 28  BUN 18  CREATININE 0.87  CALCIUM 8.9  PROT --  BILITOT --  ALKPHOS --  ALT --    AST --  GLUCOSE 219*   Lipid Panel     Component Value Date/Time   CHOL 149 05/14/2011 0550   TRIG 520* 05/14/2011 0550   HDL 24* 05/14/2011 0550   CHOLHDL 6.2 05/14/2011 0550   VLDL UNABLE TO CALCULATE IF TRIGLYCERIDE OVER 400 mg/dL 05/14/2704 2376   LDLCALC UNABLE TO CALCULATE IF TRIGLYCERIDE OVER 400 mg/dL 02/18/3149 7616    Basename 05/14/11 0550  INR 0.96   Cardiac Cath: 05/14/2011 Left mainstem: Patent with luminal irregularity noted.  Left anterior descending (LAD): The LAD is patent throughout. There is significant tortuosity in the mid vessel. There is diffuse mild to moderate stenosis in the midportion of the LAD without high-grade disease noted. The proximal LAD has a 50% stenosis at the second septal perforator. The mid LAD has a 50-60% stenosis with diffuse distal disease. The appearance of the LAD is not significantly changed from the previous study several years ago.  Left circumflex (LCx): The left circumflex is a large, dominant vessel. There is a moderate caliber first OM that divided the twin branches. The inferior OM branch has a 70% stenosis. The second OM is widely patent. The left PDA and left posterolateral branches are patent without significant stenosis there  Right coronary artery (RCA): This is a small, nondominant vessel. The stent in the mid vessel is patent. There is diffuse stenosis in  the distal portion of the stent and beyond with 80% disease in the segmental nature. There really are no branches supplying the LV.  Final Conclusions:  1. Nonobstructive coronary artery disease involving the left main, LAD, and left circumflex vessels.  2. Severe stenosis in a small, nondominant right coronary artery with continued patency of the stented segment.  FOLLOW UP PLANS AND APPOINTMENTS Discharge Orders    Future Appointments: Provider: Department: Dept Phone: Center:   05/27/2011 2:20 PM Rande Brunt, PA [facility] [phone] [facility]     Allergies   Allergen Reactions  . Ace Inhibitors   . Angiotensin Receptor Blockers   . Ciprofloxacin     confusion  . Doxycycline   . Oxycodone-Acetaminophen     REACTION: unknown reaction  . Ramipril   . Toradol (Ketorolac Tromethamine)     confusion  . Valium     Hallucinations; confusion   Medication List  As of 05/14/2011 10:29 AM   TAKE these medications         amLODipine 5 MG tablet   Commonly known as: NORVASC   Take 5 mg by mouth every morning.      aspirin 325 MG EC tablet   Take 325 mg by mouth daily.      atorvastatin 20 MG tablet   Commonly known as: LIPITOR   Take 20 mg by mouth at bedtime.      butalbital-acetaminophen-caffeine 50-325-40-30 MG per capsule   Commonly known as: FIORICET WITH CODEINE   Take 1 capsule by mouth every 4 (four) hours as needed.      cetirizine 10 MG tablet   Commonly known as: ZYRTEC   Take 10 mg by mouth daily.      chlorthalidone 25 MG tablet   Commonly known as: HYGROTON   Take 25 mg by mouth every morning.      famotidine 20 MG tablet   Commonly known as: PEPCID   Take 20 mg by mouth 2 (two) times daily.      glucosamine-chondroitin 500-400 MG tablet   Take 1 tablet by mouth 2 (two) times daily.      insulin aspart 100 UNIT/ML injection   Commonly known as: novoLOG   Inject 0-15 Units into the skin 3 (three) times daily before meals. 70-150=8units 151-200=9units      insulin glargine 100 UNIT/ML injection   Commonly known as: LANTUS   Inject 50 Units into the skin at bedtime.      metFORMIN 500 MG (MOD) 24 hr tablet   Commonly known as: GLUMETZA   Take 2 tablets (1,000 mg total) by mouth 2 (two) times daily with a meal. HOLD for 48 hours, restart on 05/17/2011.      nitroGLYCERIN 0.4 MG SL tablet   Commonly known as: NITROSTAT   Place 0.4 mg under the tongue every 5 (five) minutes as needed. For chest pain      PARoxetine 20 MG tablet   Commonly known as: PAXIL   Take 20 mg by mouth at bedtime.      prazosin 1 MG  capsule   Commonly known as: MINIPRESS   Take 1 mg by mouth 2 (two) times daily.      Vitamin D3 2000 UNITS Tabs   Take 1 tablet by mouth daily.           Follow-up Information    Follow up with Gene Serpe, PA. (May 16th at  2:20 pm)    Contact information:   [phone], [address] [place] [address] [phone]          BRING ALL MEDICATIONS WITH YOU TO FOLLOW UP APPOINTMENTS  Time spent with patient to include physician time: 33 min Signed: Theodore Demark 05/14/2011, 10:29 AM Co-Sign MD

## 2011-05-14 NOTE — CV Procedure (Signed)
   Cardiac Catheterization Procedure Note  Name: [name] MRN: [id number] DOB: August 24, 1940  Procedure: Catheter placement for coronary angiography, Selective Coronary Angiography  Indication: Chest pain, progressive exertional dyspnea in patient with known CAD   Procedural Details: The right wrist was prepped, draped, and anesthetized with 1% lidocaine. Using the modified Seldinger technique, a 5 French sheath was introduced into the right radial artery. 2.5 mg of nicardipine was administered through the sheath, weight-based unfractionated heparin was administered intravenously. Standard Judkins catheters were used for selective coronary angiograph. Catheter exchanges were performed over an exchange length guidewire. The procedure was technically quite difficult. There was marked subclavian tortuosity and a very steep angle from the innominate into the ascending aorta. I was able to image the LCA with a JL 3.5 catheter. The RCA was extremely difficult to cannulate. After multiple catheters and techniques were tried, I was able to image the vessel nonselectively using a JR-4 guide catheter with an 0.035 wire in the catheter for support. I was unable to cross the Ao valve because of such tortuous anatomy. There were no immediate procedural complications. A TR band was used for radial hemostasis at the completion of the procedure.  The patient was transferred to the post catheterization recovery area for further monitoring.  Procedural Findings: Hemodynamics: AO 124/65  Coronary angiography: Coronary dominance: right  Left mainstem: Patent with luminal irregularity noted.  Left anterior descending (LAD): The LAD is patent throughout. There is significant tortuosity in the mid vessel. There is diffuse mild to moderate stenosis in the midportion of the LAD without high-grade disease noted. The proximal LAD has a 50% stenosis at the second septal perforator. The mid LAD has a 50-60% stenosis  with diffuse distal disease. The appearance of the LAD is not significantly changed from the previous study several years ago.  Left circumflex (LCx): The left circumflex is a large, dominant vessel. There is a moderate caliber first OM that divided the twin branches. The inferior OM branch has a 70% stenosis. The second OM is widely patent. The left PDA and left posterolateral branches are patent without significant stenosis there  Right coronary artery (RCA): This is a small, nondominant vessel. The stent in the mid vessel is patent. There is diffuse stenosis in the distal portion of the stent and beyond with 80% disease in the segmental nature. There really are no branches supplying the LV.  Final Conclusions:   1. Nonobstructive coronary artery disease involving the left main, LAD, and left circumflex vessels. 2. Severe stenosis in a small, nondominant right coronary artery with continued patency of the stented segment.  Recommendations:  Medical therapy. I suspect the patient has had noncardiac chest pain. If this patient ever requires another cardiac catheterization, I would not recommend using a right radial approach as this was technically a very challenging procedure.  Tonny Bollman 05/14/2011, 8:34 AM

## 2011-05-14 NOTE — H&P (View-Only) (Signed)
[name], [name] ROOM: 230  UNIT NUMBER:  [id number] LOCATION: 27F 230 01 ADM/VISIT DATE:  05/13/2011   ADM Vaughan BrownerKathaleen Grinder:  [id number] DOB: 1940/12/19   REFERRING PHYSICIAN:  Rudi Heap, MD  REASON FOR ADMISSION:  Substernal chest pain.  HISTORY OF PRESENT ILLNESS:  The patient is a 71 year old male with a history of coronary artery disease.  The patient was awoken at 4:30 this morning out of his sleep with sudden onset of substernal chest pain, which he rated a 5/10.  He stated that there was heavy chest pressure associated with diaphoresis and shortness of breath.  The pain did not radiate to the neck or to the arms.  Immediately after this, he became quite nauseated and had to vomit.  His wife called EMS and the patient was given 4 baby aspirins and placed on oxygen therapy.  Apparently he rapidly improved, although he was not given any sublingual nitroglycerin.  By the time he got to the ER, he had only mild substernal chest pain, which ultimately resolved with sublingual nitroglycerin.  Admission EKG showed no acute myocardial infarction pattern.  His first set of troponins were also within normal limits; however, the patient, as outlined above, does have significant coronary artery disease with prior stenting to the right coronary artery in 2006 and a residual 70% mid LAD lesion.  The patient reports chronic constipation.  In addition, the patient has experienced some weakness and increased shortness of breath over the last several weeks.  He denies any orthopnea or any PND.  Currently he is in no discomfort.  He is not complaining of any chest pain or abdominal pain.  PAST MEDICAL HISTORY: 1. Diabetes mellitus. 2. Anxiety disorder. 3. Arthritis. 4. Hypertension. 5. Hyperlipidemia. 6. Coronary atherosclerosis of native coronary artery. A. Status post stent placement to the right coronary artery with residual mid 70% LAD disease.  Cardiolite study 2010 was low risk with normal ejection  fraction.  Acute encephalopathy 08/2010 with a presumptive diagnosis of West Nile virus but followup serology was not consistent with that.  The patient did get a lumbar puncture. 7. Adenomatous polyp. 8. Gallbladder disease.  The patient still has his gallbladder in place. 9. Diverticulosis. 10. Internal hemorrhoids. 11. Nephrolithiasis. 12. Chronic headaches.  PAST SURGICAL HISTORY: 1. Coronary angioplasty with stent placement. 2. Total knee arthroplasty. 3. Rotator cuff repair, joint replacement. 4. Upper GI endoscopy with biopsy, inlet patch, duodenitis 01/08/2008. 5. Colonoscopy 09/10/2008, normal. 6. Cataract extraction. 7. Cystoscopy with ureteroscopy with lithotripsy 08/2010.  ALLERGIES:  ACE INHIBITORS, ANGIOTENSIN RECEPTORS BLOCKERS, CIPROFLOXACIN, DOXYCYCLINE, OXYCODONE, ACETAMINOPHEN with an unknown reaction, RAMIPRIL, TORADOL, and VALIUM.  HISTORY:  The patient is married.  He is a former smoker with 1 pack a day for 25 years.  He stopped smoking in 01/1989.  He is a retired Gaffer.  FAMILY HISTORY:  Colon cancer in mother diagnosed at age 27, heart disease in father, breast cancer in sister, and heart disease in brother.  OUTPATIENT MEDICATIONS: 1. Amlodipine 5 mg daily. 2. Aspirin 325 mg p.o. daily. 3. Lipitor 20 mg p.o. daily. 4. BD Ultra Fine III Short Pen 31G x 8 mm. 5. Fioricet with codeine 50/325 as needed. 6. Zyrtec 10 mg p.o. daily. 7. Vitamin D3, 2000 units daily. 8. Pepcid 20 mg p.o. daily. 9. Lantus insulin 100 units with 50 units at bedtime. 10. Victoza 1.8 mg subcu before supper. 11. Glucophage 1000 mg twice a day. 12. Nitroglycerin 0.4 mg sublingual as needed.  13. Novolog FlexPen sliding scale. 14. Paxil 20 mg p.o. daily. 15. MiraLax powder as needed. 16. Minipress 1 mg 2 times daily. 17. Chlorthalidone 25 mg p.o. daily.  REVIEW OF SYSTEMS:  Nausea and vomiting as outlined above x1.  No fever or chills.  The patient is concerned  about urinary tract infection, but he reports no dysuria or frequency.  He thought that he may have some cloudy urine.  He reports also no fever or chills.  He has no melena or hematochezia.  No abdominal pain, short of chronic constipation.  He has no bleeding.  No claudication.  No visual changes.  No flank pain.  Neurological:  The patient reports no symptoms, including no headaches.  The remainder of the 18-point review of systems is negative.  PHYSICAL EXAMINATION:  Vital signs:  Blood pressure is 150/80, heart rate is 80 beats per minute, saturation is 99% on 2 L oxygen.  General:  Well-nourished, somewhat pale-appearing white male, feeling uncomfortable but without any chest pain.  HEENT:  Pupils isochoric, NCAT, PERRLA.  JVP is normal at 5-6 cm.  Normal carotid upstroke and no carotid bruits.  No thyromegaly.  Non-nodular thyroid.  Oropharynx is clear.  The patient has upper dentures.  Neck is supple.  Lungs:  Clear, breath sounds bilaterally without any wheezing.  Heart:  Regular rate and rhythm with normal S1, S2.  No murmur, rubs or gallops.  Vascular:  No edema.  Normal distal pulses bilaterally.  Abdomen:  Soft and nontender with good bowel sounds.  There is no Murphy sign or any tenderness in any of the quadrants.  There is also no CVA tenderness.  Skin:  Warm and dry.  Psychologic:  Normal affect.  Twelve-lead EKG:  Normal sinus rhythm with no acute ischemic changes.  Chest x-ray reportedly within normal limits.  LABORATORY WORK:  CBC:  White count is 7100, hemoglobin 13.9, hematocrit 42.9, MCV is 88.2, platelet count is 189,000.  BMET:  Glucose 199, BUN is 17, creatinine is 0.94.  Calcium is 8.7.  Sodium 137, potassium 3.1, CO2 is 25.  Urine specimen was not obtained.  Cardiac enzymes:  First set:  CK 104, CK-MB 4.4, index 4.2, troponin less than 0.01.  Second set was drawn and is pending.  PROBLEM LIST: 1. Substernal chest pain suggestive of acute coronary syndrome. 2. Coronary artery  disease with stent placement to the right coronary artery and residual 70% LAD disease in 2006. A. Low-risk Cardiolite 2010. B. No catheterization after 2006. 3. Hypokalemia. 4. Rule out urinary tract infection. 5. Hyperlipidemia. 6. Hypertension. 7. Asbestosis. 8. GERD. 9. Abnormal transaminases. 10. Adenomatous colonic polyps. 11. History of acute encephalitis.  PLAN: 1. The patient's chest pain, shortness of breath and diaphoresis are reminiscent of his prior presentation in 2006.  He has no acute EKG changes but at the time his chest pain was resolved.  He also has had no further nausea or vomiting, which happened after he developed substernal chest pain.  His first set of enzymes is negative, although his CK-MB index is positive. 2. The patient will be transferred to [facility] for a diagnostic catheterization.  His last catheterization was in 2006.  Glucophage needs to be held and we will hydrate the patient.  He can also be started on intravenous heparin, but would hold off on Plavix. 3. Potassium is 3.1 and the patient will be supplemented with potassium prior to his cardiac catheterization. 4. I discussed the risks and benefits of a  diagnostic catheterization and possible coronary intervention with the patient and he is willing to proceed. 5. Of note, although patient's with nausea and vomiting, his abdominal exam is rather benign.  He still has his gallbladder, but there is no right upper quadrant tenderness.  Nevertheless, I ordered an amylase and lipase that needs to be evaluated prior to cardiac catheterization.  We will also need a urine specimen; that needs to be evaluated prior to the cardiac catheterization.   __________________________    Lewayne Bunting, M.D. Alinda Money D: 05/13/2011 1610 T: 05/13/2011 1244 P: DEG  cc:  Rudi Heap, M.D. Wende Crease, M.D.

## 2011-05-14 NOTE — Progress Notes (Signed)
Inpatient Diabetes Program Recommendations  AACE/ADA: New Consensus Statement on Inpatient Glycemic Control (2009)  Target Ranges:  Prepandial:   less than 140 mg/dL      Peak postprandial:   less than 180 mg/dL (1-2 hours)      Critically ill patients:  140 - 180 mg/dL     Inpatient Diabetes Program Recommendations Insulin - Basal: Lantus 25 units HS  Note: Patient takes Lantus 50 units at home according to med reconciliation.  Recommend starting at least 1/2 home dose.    Thank you  Piedad Climes [facility] Inpatient Diabetes Coordinator [phone]

## 2011-05-14 NOTE — Interval H&P Note (Signed)
History and Physical Interval Note:  05/14/2011 7:41 AM  [name]  has presented today for surgery, with the diagnosis of chest pain  The various methods of treatment have been discussed with the patient and family. After consideration of risks, benefits and other options for treatment, the patient has consented to  Procedure(s) (LRB): LEFT HEART CATHETERIZATION WITH CORONARY ANGIOGRAM (N/A) as a surgical intervention .  The patients' history has been reviewed, patient examined, no change in status, stable for surgery.  I have reviewed the patients' chart and labs.  Questions were answered to the patient's satisfaction.     Tonny Bollman  05/14/2011 7:41 AM

## 2011-05-14 NOTE — Progress Notes (Signed)
Pt states he is on a sliding scale tid as well as receiving over 20 Units throughout the day as a part of his diabetic regimen. Pt's wife states she will bring the paper in showing exactly how much insulin the pt takes at home. Please follow up with pt and his wife in regards to this manner. Thanks. Sanda Linger

## 2011-05-14 NOTE — Discharge Instructions (Signed)
Radial Site Care Refer to this sheet in the next few weeks. These instructions provide you with information on caring for yourself after your procedure. Your caregiver may also give you more specific instructions. Your treatment has been planned according to current medical practices, but problems sometimes occur. Call your caregiver if you have any problems or questions after your procedure. HOME CARE INSTRUCTIONS  You may shower the day after the procedure.Remove the bandage (dressing) and gently wash the site with plain soap and water.Gently pat the site dry.   Do not apply powder or lotion to the site.   Do not submerge the affected site in water for 3 to 5 days.   Inspect the site at least twice daily.   Do not flex or bend the affected arm for 24 hours.   No lifting over 5 pounds (2.3 kg) for 5 days after your procedure.   Do not drive home if you are discharged the same day of the procedure. Have someone else drive you.   You may drive 24 hours after the procedure unless otherwise instructed by your caregiver.   Do not operate machinery or power tools for 24 hours.   A responsible adult should be with you for the first 24 hours after you arrive home.  What to expect:  Any bruising will usually fade within 1 to 2 weeks.   Blood that collects in the tissue (hematoma) may be painful to the touch. It should usually decrease in size and tenderness within 1 to 2 weeks.  SEEK IMMEDIATE MEDICAL CARE IF:  You have unusual pain at the radial site.   You have redness, warmth, swelling, or pain at the radial site.   You have drainage (other than a small amount of blood on the dressing).   You have chills.   You have a fever or persistent symptoms for more than 72 hours.   You have a fever and your symptoms suddenly get worse.   Your arm becomes pale, cool, tingly, or numb.   You have heavy bleeding from the site. Hold pressure on the site.  Document Released: 01/30/2010  Document Revised: 12/17/2010 Document Reviewed: 01/30/2010 Exit[employer] Patient Information 2012 [employer]. 

## 2011-05-17 MED FILL — Nicardipine HCl IV Soln 2.5 MG/ML: INTRAVENOUS | Qty: 1 | Status: AC

## 2011-05-17 NOTE — Discharge Summary (Signed)
Agree as outlined by Theodore Demark, PA-C.  Tonny Bollman 05/17/2011 10:13 PM

## 2011-05-27 ENCOUNTER — Encounter: Payer: Self-pay | Admitting: Physician Assistant

## 2011-05-27 ENCOUNTER — Ambulatory Visit (INDEPENDENT_AMBULATORY_CARE_PROVIDER_SITE_OTHER): Payer: Medicare Other | Admitting: Physician Assistant

## 2011-05-27 VITALS — BP 125/74 | HR 80 | Ht 66.0 in | Wt 217.0 lb

## 2011-05-27 DIAGNOSIS — I10 Essential (primary) hypertension: Secondary | ICD-10-CM

## 2011-05-27 DIAGNOSIS — E119 Type 2 diabetes mellitus without complications: Secondary | ICD-10-CM

## 2011-05-27 DIAGNOSIS — I2510 Atherosclerotic heart disease of native coronary artery without angina pectoris: Secondary | ICD-10-CM

## 2011-05-27 DIAGNOSIS — E785 Hyperlipidemia, unspecified: Secondary | ICD-10-CM

## 2011-05-27 DIAGNOSIS — E1159 Type 2 diabetes mellitus with other circulatory complications: Secondary | ICD-10-CM | POA: Insufficient documentation

## 2011-05-27 MED ORDER — ASPIRIN EC 81 MG PO TBEC: 81.0000 mg | DELAYED_RELEASE_TABLET | Freq: Every day | ORAL | Status: AC

## 2011-05-27 NOTE — Assessment & Plan Note (Signed)
Followed by primary M.D. 

## 2011-05-27 NOTE — Progress Notes (Signed)
HPI: Patient presents for post hospital followup [facility], s/p cardiac catheterization, following transfer from [facility]. He was seen in consultation by Dr. Andee Lineman for evaluation of CP, with recommendation to proceed with diagnostic coronary angiography. Cardiac markers prior to transfer were negative. He was last studied in 2006,  at which  time he was treated with stenting of the RCA. He had a low risk Cardiolite; normal LVF in 2010.  Cardiac catheterization yielded nonobstructive CAD involving the LM, LAD, and CFX vessels; there was severe stenosis of the small, nondominant RCA, distal to the patent mid vessel stent site. LVG was deferred.  Clinically, he has not had any CP since his recent hospitalization. His presenting symptoms may have been GI in origin, as he recalls experiencing initial nausea/vomiting and then epigastric discomfort radiating upwards.  Patient reports no complications of the right wrist incision site; however, he did have discomfort radiating up the arm for approximately 2 weeks, since resolved. Interestingly, Dr. Excell Seltzer did note that the right radial approach was technically very challenging, during the procedure.  Allergies  Allergen Reactions  . Ace Inhibitors   . Angiotensin Receptor Blockers   . Ciprofloxacin     confusion  . Doxycycline   . Oxycodone-Acetaminophen     REACTION: unknown reaction  . Ramipril   . Toradol (Ketorolac Tromethamine)     confusion  . Valium     Hallucinations; confusion    Current Outpatient Prescriptions  Medication Sig Dispense Refill  . amLODipine (NORVASC) 5 MG tablet Take 5 mg by mouth every morning.      Marland Kitchen aspirin 325 MG EC tablet Take 325 mg by mouth daily.      Marland Kitchen atorvastatin (LIPITOR) 20 MG tablet Take 20 mg by mouth at bedtime.       . butalbital-acetaminophen-caffeine (FIORICET WITH CODEINE) 50-325-40-30 MG per capsule Take 1 capsule by mouth every 4 (four) hours as needed.        . cetirizine (ZYRTEC) 10 MG tablet Take 10 mg by  mouth daily.        . chlorthalidone (HYGROTON) 25 MG tablet Take 25 mg by mouth every morning.      . Cholecalciferol (VITAMIN D3) 2000 UNITS TABS Take 1 tablet by mouth daily.        . famotidine (PEPCID) 20 MG tablet Take 20 mg by mouth 2 (two) times daily.      Marland Kitchen glucosamine-chondroitin 500-400 MG tablet Take 1 tablet by mouth 2 (two) times daily.      . insulin aspart (NOVOLOG) 100 UNIT/ML injection Inject 0-15 Units into the skin 3 (three) times daily before meals. 70-150=8units 151-200=9units      . insulin glargine (LANTUS) 100 UNIT/ML injection Inject 50 Units into the skin at bedtime.       . metFORMIN (GLUMETZA) 500 MG (MOD) 24 hr tablet Take 2 tablets (1,000 mg total) by mouth 2 (two) times daily with a meal. HOLD for 48 hours, restart on 05/17/2011.      . nitroGLYCERIN (NITROSTAT) 0.4 MG SL tablet Place 0.4 mg under the tongue every 5 (five) minutes as needed. For chest pain      . PARoxetine (PAXIL) 20 MG tablet Take 20 mg by mouth at bedtime.       . prazosin (MINIPRESS) 1 MG capsule Take 1 mg by mouth 2 (two) times daily.        Past Medical History  Diagnosis Date  . Diabetes mellitus   . Anxiety disorder   .  Arthritis   . Hypertension   . Hyperlipidemia   . Coronary atherosclerosis of native coronary artery     s/p stent placement to the right coronary artery residual mid 70% LAD disease. Cardiolite study 2010 low risk. Normal ejection fraction  . Acute encephalopathy 08/2010    ? West Nile Virus-final diagnosis was not consistent with [place] Nile virus.  . Adenomatous polyp   . Gallbladder cholesterolosis   . Diverticulosis   . Internal hemorrhoids   . Nephrolithiasis   . Chronic headaches   . Myocardial infarction     2006  . Headache   . GERD (gastroesophageal reflux disease)     Past Surgical History  Procedure Date  . Coronary angioplasty with stent placement   . Total knee arthroplasty   . Rotator cuff repair   . Joint replacement   . Upper  gastrointestinal endoscopy 01/08/2008    bx, inlet patch, duodenitis  . Colonoscopy 09/10/2008    normal  . Cataract extraction   . Cystoscopy w/ ureteroscopy w/ lithotripsy 08/2010    History   Social History  . Marital Status: Married    Spouse Name: N/A    Number of Children: 0  . Years of Education: N/A   Occupational History  . 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 Smoking status: Former Smoker -- 1.0 packs/day for 25 years    Types: Cigarettes    Quit date: 08/11/1988  . Smokeless tobacco: Former Neurosurgeon    Types: Chew    Quit date: 01/11/1989   Comment: chewed 1 pack tobacco/day for 15 years  . Alcohol Use: Yes     very rarely  . Drug Use: No  . Sexually Active: Not Currently   Other Topics Concern  . Not on file   Social History Narrative   No regular exerciseDaily caffeine usePrevious exposure to asbestos when working at power plant between [phone]    Family History  Problem Relation Age of Onset  . Colon cancer Mother     diagnosed age 24  . Heart disease Father   . Breast cancer Sister   . Heart disease Brother     ROS: no nausea, vomiting; no fever, chills; no melena, hematochezia; no claudication  PHYSICAL EXAM: Ht 5\' 6"  (1.676 m)  Wt 217 lb (98.431 kg)  BMI 35.02 kg/m2 GENERAL: 71 year-old male, moderately obese; NAD HEENT: NCAT, PERRLA, EOMI; sclera clear; no xanthelasma NECK: palpable bilateral carotid pulses, no bruits; no JVD; no TM LUNGS: CTA bilaterally CARDIAC: RRR (S1, S2); no significant murmurs; no rubs or gallops ABDOMEN: Protuberant EXTREMETIES: Minimally palpable right radial pulse, no hematoma/ecchymosis or swelling; no significant peripheral edema SKIN: warm/dry; no obvious rash/lesions MUSCULOSKELETAL: no joint deformity NEURO: no focal deficit; NL affect   EKG:    ASSESSMENT & PLAN:  No problem-specific assessment & plan notes found for this encounter.   Gene Kaziyah Parkison, PAC

## 2011-05-27 NOTE — Assessment & Plan Note (Signed)
Well-controlled on current medication regimen 

## 2011-05-27 NOTE — Patient Instructions (Signed)
   Decrease Aspirin to 81mg daily Your physician wants you to follow up in: 6 months.  You will receive a reminder letter in the mail one-two months in advance.  If you don't receive a letter, please call our office to schedule the follow up appointment  

## 2011-05-27 NOTE — Assessment & Plan Note (Signed)
Discussed at length the importance of good lipid management. Recent FLP during hospitalization at [facility] notable for cholesterol 149, triglycerides 520, and an HDL 24. Patient is on a statin, and had followup labs earlier today, per his primary M.D. Will await results of this before making any further adjustments (i.e. adding fenofibrate or omega-3 fish oil). A prior profile in 2011 showed excellent triglyceride control at 93, with an HDL of 39, and an LDL of 63.

## 2011-05-27 NOTE — Assessment & Plan Note (Signed)
Continue aggressive secondary prevention. Recent cardiac catheterization indicated stable coronary anatomy, compared to prior study in 2006. Will decrease ASA to 81 mg daily, and schedule followup with Dr. Andee Lineman in 6 months.

## 2011-09-30 ENCOUNTER — Other Ambulatory Visit: Payer: Self-pay | Admitting: Cardiology

## 2011-11-03 ENCOUNTER — Other Ambulatory Visit: Payer: Self-pay | Admitting: Internal Medicine

## 2011-12-24 ENCOUNTER — Other Ambulatory Visit: Payer: Self-pay | Admitting: Cardiology

## 2012-02-16 ENCOUNTER — Other Ambulatory Visit: Payer: Self-pay | Admitting: Cardiology

## 2012-03-16 ENCOUNTER — Other Ambulatory Visit: Payer: Self-pay | Admitting: Cardiology

## 2012-03-17 ENCOUNTER — Other Ambulatory Visit: Payer: Self-pay | Admitting: Cardiology

## 2012-04-18 ENCOUNTER — Other Ambulatory Visit: Payer: Self-pay | Admitting: *Deleted

## 2012-04-18 MED ORDER — ATORVASTATIN CALCIUM 20 MG PO TABS: 20.0000 mg | ORAL_TABLET | Freq: Every day | ORAL | Status: DC

## 2012-04-21 ENCOUNTER — Telehealth: Payer: Self-pay | Admitting: Physician Assistant

## 2012-04-21 NOTE — Telephone Encounter (Signed)
2 boxes of Januvia samples given to pt.

## 2012-05-11 HISTORY — PX: LAPAROSCOPIC CHOLECYSTECTOMY: SUR755

## 2012-05-31 ENCOUNTER — Encounter: Payer: Self-pay | Admitting: Cardiology

## 2012-06-02 ENCOUNTER — Ambulatory Visit: Payer: Self-pay | Admitting: Family Medicine

## 2012-06-02 ENCOUNTER — Encounter: Payer: Self-pay | Admitting: Family Medicine

## 2012-06-02 ENCOUNTER — Telehealth: Payer: Self-pay | Admitting: Family Medicine

## 2012-06-02 ENCOUNTER — Ambulatory Visit (INDEPENDENT_AMBULATORY_CARE_PROVIDER_SITE_OTHER): Payer: Medicare Other | Admitting: Family Medicine

## 2012-06-02 VITALS — BP 141/78 | HR 85 | Temp 97.6°F | Ht 66.0 in | Wt 214.0 lb

## 2012-06-02 DIAGNOSIS — E669 Obesity, unspecified: Secondary | ICD-10-CM

## 2012-06-02 DIAGNOSIS — R1011 Right upper quadrant pain: Secondary | ICD-10-CM | POA: Insufficient documentation

## 2012-06-02 DIAGNOSIS — E785 Hyperlipidemia, unspecified: Secondary | ICD-10-CM | POA: Insufficient documentation

## 2012-06-02 DIAGNOSIS — I2510 Atherosclerotic heart disease of native coronary artery without angina pectoris: Secondary | ICD-10-CM | POA: Insufficient documentation

## 2012-06-02 DIAGNOSIS — E119 Type 2 diabetes mellitus without complications: Secondary | ICD-10-CM

## 2012-06-02 DIAGNOSIS — I10 Essential (primary) hypertension: Secondary | ICD-10-CM | POA: Insufficient documentation

## 2012-06-02 LAB — POCT CBC
Granulocyte percent: 85.9 %G — AB (ref 37–80)
HCT, POC: 43.5 % (ref 43.5–53.7)
Hemoglobin: 15.1 g/dL (ref 14.1–18.1)
Lymph, poc: 1.7 (ref 0.6–3.4)
MCH, POC: 30.4 pg (ref 27–31.2)
MCHC: 34.8 g/dL (ref 31.8–35.4)
MCV: 87.4 fL (ref 80–97)
MPV: 7 fL (ref 0–99.8)
POC LYMPH PERCENT: 12.2 %L (ref 10–50)
Platelet Count, POC: 245 10*3/uL (ref 142–424)
RBC: 5 M/uL (ref 4.69–6.13)
RDW, POC: 14.1 %
WBC: 13.9 10*3/uL — AB (ref 4.6–10.2)

## 2012-06-02 LAB — COMPLETE METABOLIC PANEL WITH GFR
ALT: 33 U/L (ref 0–53)
AST: 15 U/L (ref 0–37)
Albumin: 3.8 g/dL (ref 3.5–5.2)
Alkaline Phosphatase: 61 U/L (ref 39–117)
BUN: 11 mg/dL (ref 6–23)
CO2: 27 mEq/L (ref 19–32)
Calcium: 9.2 mg/dL (ref 8.4–10.5)
Chloride: 96 mEq/L (ref 96–112)
Creat: 0.93 mg/dL (ref 0.50–1.35)
GFR, Est African American: >89 mL/min
GFR, Est Non African American: 82 mL/min
Glucose, Bld: 250 mg/dL — ABNORMAL HIGH (ref 70–99)
Potassium: 4.1 mEq/L (ref 3.5–5.3)
Sodium: 136 mEq/L (ref 135–145)
Total Bilirubin: 1.5 mg/dL — ABNORMAL HIGH (ref 0.3–1.2)
Total Protein: 7.1 g/dL (ref 6.0–8.3)

## 2012-06-02 LAB — LIPASE: Lipase: 15 U/L (ref 0–75)

## 2012-06-02 LAB — AMYLASE: Amylase: 16 U/L (ref 0–105)

## 2012-06-02 LAB — POCT GLYCOSYLATED HEMOGLOBIN (HGB A1C): Hemoglobin A1C: 7.5

## 2012-06-02 LAB — POCT UA - MICROALBUMIN: Microalbumin Ur, POC: POSITIVE mg/dL

## 2012-06-02 NOTE — Patient Instructions (Signed)
      Dr Neira Bentsen's Recommendations  Diet and Exercise discussed with patient.  For nutrition information, I recommend books:  1).Eat to Live by Dr Joel Fuhrman. 2).Prevent and Reverse Heart Disease by Dr Caldwell Esselstyn. 3) Dr Neal Barnard's Book: Reversing Diabetes  Exercise recommendations are:  If unable to walk, then the patient can exercise in a chair 3 times a day. By flapping arms like a bird gently and raising legs outwards to the front.  If ambulatory, the patient can go for walks for 30 minutes 3 times a week. Then increase the intensity and duration as tolerated.  Goal is to try to attain exercise frequency to 5 times a week.  If applicable: Best to perform resistance exercises (machines or weights) 2 days a week and cardio type exercises 3 days per week.  

## 2012-06-02 NOTE — Progress Notes (Signed)
Patient ID: [name] [name], male   DOB: 01-30-1940, 72 y.o.   MRN: [id number] SUBJECTIVE: HPI: Patient is here for follow up of Diabetes Mellitus/htn/hyperlipidemia: Came to get established  Symptoms of DM: Denies Nocturia ,Denies Urinary Frequency , denies Blurred vision ,deniesDizziness,denies.Dysuria,denies paresthesias, denies extremity pain or ulcers.Marland Kitchendenies chest pain. has had an annual eye exam. do check the feet. Does check CBGs. Average CBG:? Doesn't know  Denies episodes of hypoglycemia. Does have an emergency hypoglycemic plan. admits toCompliance with medications. Denies Problems with medications.  A few nights ago had RUQ severe pain in abdomen. Had work up. Has an outpatient procedure on the gallbladder.  Breakfast is pancakes with sugar free syrupe or one sausage. Lunch: 2 hotdogs, 1 hamburger Supper: eat out, cornbread, potatoes, string beans,pinto beans.chicken dumplings.   PMH/PSH: reviewed/updated in Epic  SH/FH: reviewed/updated in Epic  Allergies: reviewed/updated in Epic  Medications: reviewed/updated in Epic  Immunizations: reviewed/updated in Epic  ROS: As above in the HPI. All other systems are stable or negative.  OBJECTIVE: APPEARANCE: Obese WM Patient in no acute distress.The patient appeared well nourished and normally developed. Acyanotic. Waist:46 inches VITAL SIGNS:BP 141/78  Pulse 85  Temp(Src) 97.6 F (36.4 C) (Oral)  Ht 5\' 6"  (1.676 m)  Wt 214 lb (97.07 kg)  BMI 34.56 kg/m2 Obese W Male  SKIN: warm and  Dry without overt rashes, tattoos and scars  HEAD and Neck: without JVD, Head and scalp: normal Eyes:No scleral icterus. Fundi normal, eye movements normal. Ears: Auricle normal, canal normal, Tympanic membranes normal, insufflation normal. Nose: normal Throat: normal Neck & thyroid: normal  CHEST & LUNGS: Chest wall: normal Lungs: Clear  CVS: Reveals the PMI to be normally located. Regular rhythm, First and Second  Heart sounds are normal,  absence of murmurs, rubs or gallops. Peripheral vasculature: Radial pulses: normal Dorsal pedis pulses: normal Posterior pulses: normal  ABDOMEN:  Appearance: obese Benign,, no organomegaly, no masses, no Abdominal Aortic enlargement. No Guarding , mild RUQ tenderness, no rebound. No Bruits. Bowel sounds: normal  RECTAL: N/A GU: N/A  EXTREMETIES: nonedematous. Both Femoral and Pedal pulses are normal.  MUSCULOSKELETAL:  Spine: normal Joints: intact  NEUROLOGIC: oriented to time,place and person; nonfocal. Strength is normal Sensory is normal Reflexes are normal Cranial Nerves are normal.  ASSESSMENT: DM (diabetes mellitus) - Plan: POCT glycosylated hemoglobin (Hb A1C), POCT UA - Microalbumin, COMPLETE METABOLIC PANEL WITH GFR, Microalbumin, urine  Obesity, unspecified  HTN (hypertension) - Plan: COMPLETE METABOLIC PANEL WITH GFR  HLD (hyperlipidemia) - Plan: COMPLETE METABOLIC PANEL WITH GFR, NMR Lipoprofile with Lipids  CAD (coronary artery disease)  Abdominal pain, right upper quadrant - Plan: COMPLETE METABOLIC PANEL WITH GFR, POCT CBC, Amylase, Lipase    PLAN:      Dr Woodroe Mode Recommendations  Diet and Exercise discussed with patient.  For nutrition information, I recommend books:  1).Eat to Live by Dr Monico Hoar. 2).Prevent and Reverse Heart Disease by Dr Suzzette Righter. 3) Dr Katherina Right Book: Reversing Diabetes  Exercise recommendations are:  If unable to walk, then the patient can exercise in a chair 3 times a day. By flapping arms like a bird gently and raising legs outwards to the front.  If ambulatory, the patient can go for walks for 30 minutes 3 times a week. Then increase the intensity and duration as tolerated.  Goal is to try to attain exercise frequency to 5 times a week.  If applicable: Best to perform resistance exercises (machines or weights) 2  days a week and cardio type exercises 3 days per  week.  Orders Placed This Encounter  Procedures  . COMPLETE METABOLIC PANEL WITH GFR  . NMR Lipoprofile with Lipids  . Amylase  . Lipase  . Microalbumin, urine  . POCT glycosylated hemoglobin (Hb A1C)  . POCT UA - Microalbumin  . POCT CBC   Results for orders placed in visit on 06/02/12  POCT GLYCOSYLATED HEMOGLOBIN (HGB A1C)      Result Value Range   Hemoglobin A1C 7.5%    POCT UA - MICROALBUMIN      Result Value Range   Microalbumin Ur, POC positive    POCT CBC      Result Value Range   WBC 13.9 (*) 4.6 - 10.2 K/uL   Lymph, poc 1.7  0.6 - 3.4   POC LYMPH PERCENT 12.2  10 - 50 %L   MID (cbc)    0 - 0.9   Granulocyte percent 85.9 (*) 37 - 80 %G   RBC 5.0  4.69 - 6.13 M/uL   Hemoglobin 15.1  14.1 - 18.1 g/dL   HCT, POC 16.1  09.6 - 53.7 %   MCV 87.4  80 - 97 fL   MCH, POC 30.4  27 - 31.2 pg   MCHC 34.8  31.8 - 35.4 g/dL   RDW, POC 04.5     Platelet Count, POC 245.0  142 - 424 K/uL   MPV 7.0  0 - 99.8 fL   No orders of the defined types were placed in this encounter.   Discussed the metabolic syndrome.. Discussed targets. Requested records from [facility]. RTC 3 months  Maryla Morrow. Modesto Charon, M.D.

## 2012-06-03 ENCOUNTER — Encounter: Payer: Self-pay | Admitting: Cardiology

## 2012-06-03 LAB — MICROALBUMIN, URINE: Microalb, Ur: 23.72 mg/dL — ABNORMAL HIGH (ref 0.00–1.89)

## 2012-06-06 NOTE — Telephone Encounter (Signed)
Please advise 

## 2012-06-07 LAB — NMR LIPOPROFILE WITH LIPIDS
Cholesterol, Total: 139 mg/dL (ref <200)
HDL Particle Number: 29.5 umol/L — ABNORMAL LOW (ref >=30.5)
HDL Size: 9.4 nm (ref >=9.2)
HDL-C: 40 mg/dL (ref >=40)
LDL (calc): 78 mg/dL (ref <100)
LDL Particle Number: 1136 nmol/L — ABNORMAL HIGH (ref <1000)
LDL Size: 19.9 nm — ABNORMAL LOW (ref >20.5)
LP-IR Score: 31 (ref <=45)
Large HDL-P: 5.1 umol/L (ref >=4.8)
Large VLDL-P: 1.1 nmol/L (ref <=2.7)
Small LDL Particle Number: 737 nmol/L — ABNORMAL HIGH (ref <=527)
Triglycerides: 107 mg/dL (ref <150)
VLDL Size: 42.5 nm (ref <=46.6)

## 2012-06-07 NOTE — Telephone Encounter (Signed)
Dr Arlean Hopping called Dr Modesto Charon at pt request due to urine results and his allergies.

## 2012-06-12 ENCOUNTER — Telehealth: Payer: Self-pay | Admitting: Family Medicine

## 2012-06-12 NOTE — Telephone Encounter (Signed)
Late  Entry. Received a call from surgeon re: Culture positive for E Coli and need to cover with antibiotics but patient has known antibiotic allergies to cipro and doxycycline. I had recommended rocephin IV if in the hospital or outpatient cephalexin based on the sensitivities. FW

## 2012-08-16 ENCOUNTER — Other Ambulatory Visit: Payer: Self-pay

## 2012-08-29 ENCOUNTER — Other Ambulatory Visit: Payer: Medicare Other

## 2012-09-05 ENCOUNTER — Ambulatory Visit: Payer: Medicare Other | Admitting: Family Medicine

## 2012-09-12 ENCOUNTER — Other Ambulatory Visit: Payer: Self-pay | Admitting: Physician Assistant

## 2012-09-22 ENCOUNTER — Ambulatory Visit (INDEPENDENT_AMBULATORY_CARE_PROVIDER_SITE_OTHER): Payer: Medicare Other | Admitting: Family Medicine

## 2012-09-22 ENCOUNTER — Encounter: Payer: Self-pay | Admitting: Family Medicine

## 2012-09-22 VITALS — BP 122/76 | HR 66 | Temp 97.9°F | Wt 213.8 lb

## 2012-09-22 DIAGNOSIS — I10 Essential (primary) hypertension: Secondary | ICD-10-CM

## 2012-09-22 DIAGNOSIS — E119 Type 2 diabetes mellitus without complications: Secondary | ICD-10-CM

## 2012-09-22 DIAGNOSIS — E669 Obesity, unspecified: Secondary | ICD-10-CM

## 2012-09-22 DIAGNOSIS — I2510 Atherosclerotic heart disease of native coronary artery without angina pectoris: Secondary | ICD-10-CM

## 2012-09-22 DIAGNOSIS — J61 Pneumoconiosis due to asbestos and other mineral fibers: Secondary | ICD-10-CM

## 2012-09-22 DIAGNOSIS — E785 Hyperlipidemia, unspecified: Secondary | ICD-10-CM

## 2012-09-22 DIAGNOSIS — K219 Gastro-esophageal reflux disease without esophagitis: Secondary | ICD-10-CM

## 2012-09-22 LAB — POCT URINALYSIS DIPSTICK
Bilirubin, UA: NEGATIVE
Blood, UA: NEGATIVE
Glucose, UA: NEGATIVE
Ketones, UA: NEGATIVE
Leukocytes, UA: NEGATIVE
Nitrite, UA: NEGATIVE
Protein, UA: NEGATIVE
Spec Grav, UA: 1.02
Urobilinogen, UA: NEGATIVE
pH, UA: 6

## 2012-09-22 LAB — POCT UA - MICROSCOPIC ONLY
Bacteria, U Microscopic: NEGATIVE
Casts, Ur, LPF, POC: NEGATIVE
Crystals, Ur, HPF, POC: NEGATIVE
RBC, urine, microscopic: NEGATIVE
WBC, Ur, HPF, POC: NEGATIVE
Yeast, UA: NEGATIVE

## 2012-09-22 LAB — POCT GLYCOSYLATED HEMOGLOBIN (HGB A1C): Hemoglobin A1C: 7.2

## 2012-09-22 MED ORDER — INSULIN ASPART 100 UNIT/ML ~~LOC~~ SOLN: 0.0000 [IU] | Freq: Three times a day (TID) | SUBCUTANEOUS | Status: DC

## 2012-09-22 NOTE — Progress Notes (Signed)
Patient ID: [name], male   DOB: 12-20-1940, 72 y.o.   MRN: [id number] SUBJECTIVE: CC: Chief Complaint  Patient presents with  . Follow-up    fastng  follow up thinks on to much bp meds    HPI: Patient is here for follow up of Diabetes Mellitus/HTN/HLD/CAD: Symptoms evaluated: Denies Nocturia ,Denies Urinary Frequency , denies Blurred vision ,deniesDizziness,denies.Dysuria,denies paresthesias, denies extremity pain or ulcers.Marland Kitchendenies chest pain. has had an annual eye exam. do check the feet. Does check CBGs. Average UVO:[id number] Denies episodes of hypoglycemia. Does have an emergency hypoglycemic plan. admits toCompliance with medications. Denies Problems with medications.   Breakfast: english muffin with eggs and sausage and cheese Lunch: beans and potato, salad, sometimes fried chicken, hamburgers, french fries Supper:soup & salad; or a sandwich   Past Medical History  Diagnosis Date  . Diabetes mellitus   . Anxiety disorder   . Arthritis   . Hypertension   . Hyperlipidemia   . Coronary atherosclerosis of native coronary artery     s/p stent placement to the right coronary artery residual mid 70% LAD disease. Cardiolite study 2010 low risk. Normal ejection fraction  . Acute encephalopathy 08/2010    ? West Nile Virus-final diagnosis was not consistent with [place] Nile virus.  . Adenomatous polyp   . Gallbladder cholesterolosis   . Diverticulosis   . Internal hemorrhoids   . Nephrolithiasis   . Chronic headaches   . Myocardial infarction     2006  . Headache(784.0)   . GERD (gastroesophageal reflux disease)    Past Surgical History  Procedure Laterality Date  . Coronary angioplasty with stent placement    . Total knee arthroplasty    . Rotator cuff repair    . Joint replacement    . Upper gastrointestinal endoscopy  01/08/2008    bx, inlet patch, duodenitis  . Colonoscopy  09/10/2008    normal  . Cataract extraction    . Cystoscopy w/ ureteroscopy w/  lithotripsy  08/2010   History   Social History  . Marital Status: Married    Spouse Name: N/A    Number of Children: 0  . Years of Education: N/A   Occupational History  . 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 Smoking status: Former Smoker -- 1.00 packs/day for 25 years    Types: Cigarettes    Quit date: 08/11/1988  . Smokeless tobacco: Former Neurosurgeon    Types: Chew    Quit date: 01/11/1989     Comment: chewed 1 pack tobacco/day for 15 years  . Alcohol Use: Yes     Comment: very rarely  . Drug Use: No  . Sexual Activity: Not Currently   Other Topics Concern  . 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 Colon cancer Mother     diagnosed age 76  . Heart disease Father   . Breast cancer Sister   . Heart disease Brother    Current Outpatient Prescriptions on File Prior to Visit  Medication Sig Dispense Refill  . acetaminophen (TYLENOL) 500 MG tablet Take 500 mg by mouth every 6 (six) hours as needed.      Marland Kitchen amLODipine (NORVASC) 5 MG tablet TAKE 1 TABLET BY MOUTH EVERY DAY  90 tablet  0  . atorvastatin (LIPITOR) 20 MG tablet Take 1 tablet (20 mg total) by mouth at bedtime.  90 tablet  0  . butalbital-acetaminophen-caffeine (FIORICET WITH CODEINE) 50-325-40-30 MG per capsule Take 1 capsule by mouth every 4 (four) hours as needed.        . cetirizine (ZYRTEC) 10 MG tablet Take 10 mg by mouth daily.        . chlorthalidone (HYGROTON) 25 MG tablet TAKE 1 TABLET BY MOUTH EVERY DAY. **NEEDS TO SCHEDULE A SIX MONTH APPOINTMENT  90 tablet  0  . Cholecalciferol (VITAMIN D3) 2000 UNITS TABS Take 1 tablet by mouth daily.        . famotidine (PEPCID) 20 MG tablet TAKE 1 TABLET BY MOUTH TWICE A DAY *30 MIN. BEFORE BREAKFAST & SUPPER  180 tablet  0  . insulin aspart (NOVOLOG) 100 UNIT/ML injection  Inject 0-15 Units into the skin 3 (three) times daily before meals. 70-150=8units 151-200=9units      . insulin glargine (LANTUS) 100 UNIT/ML injection Inject 52 Units into the skin at bedtime.       . metFORMIN (GLUMETZA) 500 MG (MOD) 24 hr tablet Take 2 tablets (1,000 mg total) by mouth 2 (two) times daily with a meal. HOLD for 48 hours, restart on 05/17/2011.      Marland Kitchen NITROSTAT 0.4 MG SL tablet USE AS DIRECTED AS NEEDED FOR CHEST PAIN  25 each  1  . PARoxetine (PAXIL) 20 MG tablet Take 20 mg by mouth at bedtime.       . prazosin (MINIPRESS) 1 MG capsule TAKE ONE CAPSULE BY MOUTH TWICE A DAY  180 capsule  0   No current facility-administered medications on file prior to visit.   Allergies  Allergen Reactions  . Ace Inhibitors   . Angiotensin Receptor Blockers   . Ciprofloxacin     confusion  . Doxycycline   . Oxycodone-Acetaminophen     REACTION: unknown reaction  . Ramipril   . Toradol [Ketorolac Tromethamine]     confusion  . Valium     Hallucinations; confusion    There is no immunization history on file for this patient. Prior to Admission medications   Medication Sig Start Date End Date Taking? Authorizing Provider  acetaminophen (TYLENOL) 500 MG tablet Take 500 mg by mouth every 6 (six) hours as needed.   Yes Historical Provider, MD  amLODipine (NORVASC) 5 MG tablet TAKE 1 TABLET BY MOUTH EVERY DAY 12/24/11  Yes Prescott Parma, PA-C  atorvastatin (LIPITOR) 20 MG tablet Take 1 tablet (20 mg total) by mouth at bedtime. 04/18/12  Yes Ernestina Penna, MD  butalbital-acetaminophen-caffeine (FIORICET WITH CODEINE) [phone] MG per capsule Take 1 capsule by mouth every 4 (four) hours as needed.     Yes Historical Provider, MD  cetirizine (ZYRTEC) 10 MG tablet Take 10 mg by mouth daily.     Yes Historical Provider, MD  chlorthalidone (HYGROTON) 25 MG tablet TAKE 1 TABLET BY MOUTH EVERY DAY. **NEEDS TO SCHEDULE A SIX MONTH APPOINTMENT 09/12/12  Yes Jonelle Sidle, MD  Cholecalciferol  (VITAMIN D3) 2000 UNITS TABS Take 1 tablet by mouth daily.     Yes Historical Provider, MD  famotidine (PEPCID) 20 MG tablet TAKE 1 TABLET BY MOUTH TWICE A DAY *30 MIN. BEFORE BREAKFAST & SUPPER 11/03/11  Yes Iva Boop, MD  glucose blood test strip  08/28/12  Yes Historical Provider, MD  insulin aspart (NOVOLOG) 100 UNIT/ML injection Inject 0-15 Units into the skin 3 (  three) times daily before meals. 70-150=8units 151-200=9units   Yes Historical Provider, MD  insulin glargine (LANTUS) 100 UNIT/ML injection Inject 52 Units into the skin at bedtime.    Yes Historical Provider, MD  LANCETS ULTRA THIN 30G MISC  08/28/12  Yes Historical Provider, MD  metFORMIN (GLUMETZA) 500 MG (MOD) 24 hr tablet Take 2 tablets (1,000 mg total) by mouth 2 (two) times daily with a meal. HOLD for 48 hours, restart on 05/17/2011. 05/14/11  Yes Rhonda G Barrett, PA-C  NITROSTAT 0.4 MG SL tablet USE AS DIRECTED AS NEEDED FOR CHEST PAIN 09/30/11  Yes Prescott Parma, PA-C  PARoxetine (PAXIL) 20 MG tablet Take 20 mg by mouth at bedtime.    Yes Historical Provider, MD  prazosin (MINIPRESS) 1 MG capsule TAKE ONE CAPSULE BY MOUTH TWICE A DAY 02/16/12  Yes Prescott Parma, PA-C    ROS: As above in the HPI. All other systems are stable or negative.  OBJECTIVE: APPEARANCE:  Patient in no acute distress.The patient appeared well nourished and normally developed. Acyanotic. Waist:45.5 inches VITAL SIGNS:BP 122/76  Pulse 66  Temp(Src) 97.9 F (36.6 C)  Wt 213 lb 12.8 oz (96.979 kg)  BMI 34.52 kg/m2 WM obese  SKIN: warm and  Dry without overt rashes, tattoos and scars  HEAD and Neck: without JVD, Head and scalp: normal Eyes:No scleral icterus. Fundi normal, eye movements normal. Ears: Auricle normal, canal normal, Tympanic membranes normal, insufflation normal. Nose: normal Throat: normal Neck & thyroid: normal  CHEST & LUNGS: Chest wall: normal Lungs: Clear  CVS: Reveals the PMI to be normally located. Regular  rhythm, First and Second Heart sounds are normal,  absence of murmurs, rubs or gallops. Peripheral vasculature: Radial pulses: normal Dorsal pedis pulses: normal Posterior pulses: normal  ABDOMEN:  Appearance: obese Benign, no organomegaly, no masses, no Abdominal Aortic enlargement. No Guarding , no rebound. No Bruits. Bowel sounds: normal  RECTAL: N/A GU: N/A  EXTREMETIES: nonedematous.  MUSCULOSKELETAL:  Spine: normal Joints: intact  NEUROLOGIC: oriented to time,place and person; nonfocal. Strength is normal Sensory is normal Reflexes are normal Cranial Nerves are normal.  ASSESSMENT: Diabetes - Plan: POCT urinalysis dipstick, POCT UA - Microscopic Only, POCT glycosylated hemoglobin (Hb A1C), CMP14+EGFR, insulin aspart (NOVOLOG) 100 UNIT/ML injection  CAD (coronary artery disease)  GERD (gastroesophageal reflux disease)  HLD (hyperlipidemia) - Plan: CMP14+EGFR, NMR, lipoprofile  HTN (hypertension)  Obesity, unspecified  ASBESTOSIS   PLAN:      Dr Woodroe Mode Recommendations  For nutrition information, I recommend books:  1).Eat to Live by Dr Monico Hoar. 2).Prevent and Reverse Heart Disease by Dr Suzzette Righter. 3) Dr Katherina Right Book:  Program to Reverse Diabetes  Exercise recommendations are:  If unable to walk, then the patient can exercise in a chair 3 times a day. By flapping arms like a bird gently and raising legs outwards to the front.  If ambulatory, the patient can go for walks for 30 minutes 3 times a week. Then increase the intensity and duration as tolerated.  Goal is to try to attain exercise frequency to 5 times a week.  If applicable: Best to perform resistance exercises (machines or weights) 2 days a week and cardio type exercises 3 days per week.  Orders Placed This Encounter  Procedures  . CMP14+EGFR  . NMR, lipoprofile  . POCT urinalysis dipstick  . POCT UA - Microscopic Only  . POCT glycosylated hemoglobin (Hb  A1C)    Meds ordered this encounter  Medications  .  glucose blood test strip    Sig:   . LANCETS ULTRA THIN 30G MISC    Sig:   . insulin aspart (NOVOLOG) 100 UNIT/ML injection    Sig: Inject 0-15 Units into the skin 3 (three) times daily before meals. 70-150=8units 151-200=9units    Dispense:  15 vial    Refill:  3   Results for orders placed in visit on 09/22/12  POCT URINALYSIS DIPSTICK      Result Value Range   Color, UA amber     Clarity, UA clear     Glucose, UA neg     Bilirubin, UA neg     Ketones, UA neg     Spec Grav, UA 1.020     Blood, UA neg     pH, UA 6.0     Protein, UA neg     Urobilinogen, UA negative     Nitrite, UA neg     Leukocytes, UA Negative    POCT UA - MICROSCOPIC ONLY      Result Value Range   WBC, Ur, HPF, POC neg     RBC, urine, microscopic neg     Bacteria, U Microscopic neg     Mucus, UA occ     Epithelial cells, urine per micros occ     Crystals, Ur, HPF, POC neg     Casts, Ur, LPF, POC neg     Yeast, UA neg    POCT GLYCOSYLATED HEMOGLOBIN (HGB A1C)      Result Value Range   Hemoglobin A1C 7.2%     Await the rest of labs. counselled on lifestyle changes needed to reduce his risks especially to correct his metabolic syndrome and lose weight, exercise and change to a more plant based  Diet which would be more conducive to an improved life expectancy.  Return in about 3 months (around 12/22/2012) for 2 weeks with Tammy for DM insulin adjustment, 3 months with Dr Modesto Charon.  Harsh Trulock P. Modesto Charon, M.D.

## 2012-09-22 NOTE — Patient Instructions (Addendum)
      Dr Nollie Shiflett's Recommendations  For nutrition information, I recommend books:  1).Eat to Live by Dr Joel Fuhrman. 2).Prevent and Reverse Heart Disease by Dr Caldwell Esselstyn. 3) Dr Neal Barnard's Book:  Program to Reverse Diabetes  Exercise recommendations are:  If unable to walk, then the patient can exercise in a chair 3 times a day. By flapping arms like a bird gently and raising legs outwards to the front.  If ambulatory, the patient can go for walks for 30 minutes 3 times a week. Then increase the intensity and duration as tolerated.  Goal is to try to attain exercise frequency to 5 times a week.  If applicable: Best to perform resistance exercises (machines or weights) 2 days a week and cardio type exercises 3 days per week.  

## 2012-09-24 LAB — CMP14+EGFR
ALT: 28 IU/L (ref 0–44)
AST: 26 IU/L (ref 0–40)
Albumin/Globulin Ratio: 1.4 (ref 1.1–2.5)
Albumin: 4.1 g/dL (ref 3.5–4.8)
Alkaline Phosphatase: 71 IU/L (ref 39–117)
BUN/Creatinine Ratio: 20 (ref 10–22)
BUN: 20 mg/dL (ref 8–27)
CO2: 28 mmol/L (ref 18–29)
Calcium: 9.5 mg/dL (ref 8.6–10.2)
Chloride: 97 mmol/L (ref 97–108)
Creatinine, Ser: 1.02 mg/dL (ref 0.76–1.27)
GFR calc Af Amer: 84 mL/min/{1.73_m2} (ref >59)
GFR calc non Af Amer: 73 mL/min/{1.73_m2} (ref >59)
Globulin, Total: 2.9 g/dL (ref 1.5–4.5)
Glucose: 148 mg/dL — ABNORMAL HIGH (ref 65–99)
Potassium: 4.3 mmol/L (ref 3.5–5.2)
Sodium: 141 mmol/L (ref 134–144)
Total Bilirubin: 0.7 mg/dL (ref 0.0–1.2)
Total Protein: 7 g/dL (ref 6.0–8.5)

## 2012-09-24 LAB — NMR, LIPOPROFILE
Cholesterol: 136 mg/dL (ref <200)
HDL Cholesterol by NMR: 34 mg/dL — ABNORMAL LOW (ref >=40)
HDL Particle Number: 28.6 umol/L — ABNORMAL LOW (ref >=30.5)
LDL Particle Number: 1297 nmol/L — ABNORMAL HIGH (ref <1000)
LDL Size: 20 nm — ABNORMAL LOW (ref >20.5)
LDLC SERPL CALC-MCNC: 71 mg/dL (ref <100)
LP-IR Score: 70 — ABNORMAL HIGH (ref <=45)
Small LDL Particle Number: 980 nmol/L — ABNORMAL HIGH (ref <=527)
Triglycerides by NMR: 156 mg/dL — ABNORMAL HIGH (ref <150)

## 2012-09-26 ENCOUNTER — Telehealth: Payer: Self-pay | Admitting: Family Medicine

## 2012-09-27 NOTE — Telephone Encounter (Signed)
done

## 2012-09-29 ENCOUNTER — Other Ambulatory Visit: Payer: Self-pay | Admitting: Family Medicine

## 2012-09-29 DIAGNOSIS — E119 Type 2 diabetes mellitus without complications: Secondary | ICD-10-CM

## 2012-10-05 ENCOUNTER — Ambulatory Visit (INDEPENDENT_AMBULATORY_CARE_PROVIDER_SITE_OTHER): Payer: Medicare Other | Admitting: Cardiology

## 2012-10-05 ENCOUNTER — Encounter: Payer: Self-pay | Admitting: Cardiology

## 2012-10-05 VITALS — BP 133/76 | HR 65 | Ht 66.0 in | Wt 216.8 lb

## 2012-10-05 DIAGNOSIS — I2510 Atherosclerotic heart disease of native coronary artery without angina pectoris: Secondary | ICD-10-CM

## 2012-10-05 DIAGNOSIS — E785 Hyperlipidemia, unspecified: Secondary | ICD-10-CM

## 2012-10-05 DIAGNOSIS — I10 Essential (primary) hypertension: Secondary | ICD-10-CM

## 2012-10-05 NOTE — Assessment & Plan Note (Signed)
Recent LDL 71, no change to current regimen.

## 2012-10-05 NOTE — Progress Notes (Signed)
Clinical Summary [name] is a 72 y.o.male presenting for office visit. He was last seen in the office by Mr. Serpe PA-C in May 2013. Records reviewed.  Cardiac catheterization May 2013 demonstrated nonobstructive disease with patent stent site in the RCA which was a small and severely diseased, nondominant vessel. Medical therapy was most appropriate. He is not reporting any angina symptoms.  Recent lab work showed LDL 71, HDL 34, triglycerides 156, cholesterol 136. He reports compliance with Lipitor.  Today we reviewed his home blood pressure checks, most of which look reasonable. He states that he is interested in trying to cut back his antihypertensive regimen. I told him that simplfying his medicines was a reasonable thought, however I was not entirely certain that his blood pressure would be as well-controlled. He wanted to try and see how he would do. We also discussed his diet, reasonable exercise expectations, and salt restriction.   Allergies  Allergen Reactions  . Ace Inhibitors   . Angiotensin Receptor Blockers   . Ciprofloxacin     confusion  . Doxycycline   . Oxycodone-Acetaminophen     REACTION: unknown reaction  . Ramipril   . Toradol [Ketorolac Tromethamine]     confusion  . Valium     Hallucinations; confusion    Current Outpatient Prescriptions  Medication Sig Dispense Refill  . acetaminophen (TYLENOL) 500 MG tablet Take 500 mg by mouth every 6 (six) hours as needed. Takes 2 every morning      . amLODipine (NORVASC) 5 MG tablet TAKE 1 TABLET BY MOUTH EVERY DAY  90 tablet  0  . aspirin 81 MG tablet Take 81 mg by mouth daily.      Marland Kitchen atorvastatin (LIPITOR) 20 MG tablet Take 1 tablet (20 mg total) by mouth at bedtime.  90 tablet  0  . butalbital-acetaminophen-caffeine (FIORICET WITH CODEINE) 50-325-40-30 MG per capsule Take 1 capsule by mouth every 4 (four) hours as needed.        . cetirizine (ZYRTEC) 10 MG tablet Take 10 mg by mouth daily.        .  chlorthalidone (HYGROTON) 25 MG tablet Take 12.5 mg by mouth daily. As directed.      . Cholecalciferol (VITAMIN D3) 2000 UNITS TABS Take 1 tablet by mouth daily.        . famotidine (PEPCID) 20 MG tablet TAKE 1 TABLET BY MOUTH TWICE A DAY *30 MIN. BEFORE BREAKFAST & SUPPER  180 tablet  0  . insulin aspart (NOVOLOG) 100 UNIT/ML injection Inject 0-15 Units into the skin 3 (three) times daily before meals. 70-150=8units 151-200=9units  15 vial  3  . insulin glargine (LANTUS) 100 UNIT/ML injection Inject 52 Units into the skin at bedtime.       . metFORMIN (GLUMETZA) 500 MG (MOD) 24 hr tablet Take 2 tablets (1,000 mg total) by mouth 2 (two) times daily with a meal. HOLD for 48 hours, restart on 05/17/2011.      Marland Kitchen NITROSTAT 0.4 MG SL tablet USE AS DIRECTED AS NEEDED FOR CHEST PAIN  25 each  1  . PARoxetine (PAXIL) 20 MG tablet Take 20 mg by mouth at bedtime.       . prazosin (MINIPRESS) 1 MG capsule TAKE ONE CAPSULE BY MOUTH TWICE A DAY  180 capsule  0   No current facility-administered medications for this visit.    Past Medical History  Diagnosis Date  . Type 2 diabetes mellitus   . Anxiety disorder   .  Arthritis   . Essential hypertension, benign   . Hyperlipidemia   . Coronary atherosclerosis of native coronary artery     BMS nondominant RCA 12/2004  . Acute encephalopathy 08/2010    Final diagnosis was not consistent with West Nile virus.  . Adenomatous polyp   . Gallbladder cholesterolosis   . Diverticulosis   . Internal hemorrhoids   . Nephrolithiasis   . Chronic headaches   . Myocardial infarction     2006  . GERD (gastroesophageal reflux disease)     Social History [name] reports that he quit smoking about 24 years ago. His smoking use included Cigarettes. He has a 25 pack-year smoking history. He quit smokeless tobacco use about 23 years ago. His smokeless tobacco use included Chew. [name] reports that  drinks alcohol.  Review of Systems No angina, no progressive  shortness of breath, no palpitations. Had a cholecystectomy back in May. Otherwise negative.  Physical Examination Filed Vitals:   10/05/12 1041  BP: 133/76  Pulse: 65   Filed Weights   10/05/12 1041  Weight: 216 lb 12.8 oz (98.34 kg)   Appears comfortable at rest. HEENT: Conjunctiva and lids normal, oropharynx clear. Neck: Supple, no elevated JVP or carotid bruits, no thyromegaly. Lungs: Clear to auscultation, nonlabored breathing at rest. Cardiac: Regular rate and rhythm, no S3 or significant systolic murmur, no pericardial rub. Abdomen: Soft, nontender, bowel sounds present, no guarding or rebound. Extremities: No pitting edema, distal pulses 2+. Skin: Warm and dry. Musculoskeletal: No kyphosis. Neuropsychiatric: Alert and oriented x3, affect grossly appropriate.   Problem List and Plan   CORONARY ATHEROSCLEROSIS NATIVE CORONARY ARTERY Symptomatically stable on medical therapy. Continue observation for now. Cardiac catheterization report from last year reviewed.  HYPERLIPIDEMIA-MIXED Recent LDL 71, no change to current regimen.  Essential hypertension, benign [name] really wants to try and simplify his antihypertensive regimen. He is checking his blood pressure daily. Plan will be to wean him off chlorthalidone and see how he does. I encouraged him to follow a low-sodium diet, maintaining an exercise regimen. Weight loss would be beneficial. If his blood pressure trends upward, he will need to reconsider coming off the medication.    [name], M.D., F.A.C.C.

## 2012-10-05 NOTE — Assessment & Plan Note (Signed)
Mr. Pfeifer really wants to try and simplify his antihypertensive regimen. He is checking his blood pressure daily. Plan will be to wean him off chlorthalidone and see how he does. I encouraged him to follow a low-sodium diet, maintaining an exercise regimen. Weight loss would be beneficial. If his blood pressure trends upward, he will need to reconsider coming off the medication.

## 2012-10-05 NOTE — Patient Instructions (Addendum)
Your physician recommends that you schedule a follow-up appointment in: 6 months. You will receive a reminder letter in the mail in about 4 months reminding you to call and schedule your appointment. If you don't receive this letter, please contact our office. Your physician has recommended you make the following change in your medication: Decrease your chlorthalidone to 12.5 mg daily. Please break your 25 mg tablet in half daily for a few weeks. Please monitor your blood pressure during this period. If your blood pressures remain stable, you may completely stop this medication. All other medications will remain the same.

## 2012-10-05 NOTE — Assessment & Plan Note (Signed)
Symptomatically stable on medical therapy. Continue observation for now. Cardiac catheterization report from last year reviewed.

## 2012-11-16 ENCOUNTER — Other Ambulatory Visit: Payer: Self-pay

## 2012-12-06 ENCOUNTER — Encounter: Payer: Self-pay | Admitting: *Deleted

## 2012-12-26 ENCOUNTER — Telehealth: Payer: Self-pay | Admitting: Cardiology

## 2012-12-26 MED ORDER — CHLORTHALIDONE 25 MG PO TABS: 25.0000 mg | ORAL_TABLET | Freq: Every day | ORAL | Status: DC

## 2012-12-26 MED ORDER — CHLORTHALIDONE 25 MG PO TABS: 12.5000 mg | ORAL_TABLET | Freq: Every day | ORAL | Status: DC

## 2012-12-26 NOTE — Telephone Encounter (Signed)
Wife called to say that after taking 1/2 of spironolactone as instructed by Dr. Diona Browner at last office visit, patient's bp went back up in the 140's, so patient did restart the whole tablet of spironolactone again.

## 2012-12-26 NOTE — Telephone Encounter (Signed)
Received fax refill request  Rx # [id number] Medication:  Chlorthalidone 25 mg tablet Qty 90 Sig:  Take one tablet by mouth every day Physician:  Diona Browner

## 2012-12-26 NOTE — Telephone Encounter (Signed)
rx request to pharmacy

## 2013-01-01 ENCOUNTER — Ambulatory Visit (INDEPENDENT_AMBULATORY_CARE_PROVIDER_SITE_OTHER): Payer: Medicare Other | Admitting: Family Medicine

## 2013-01-01 ENCOUNTER — Encounter: Payer: Self-pay | Admitting: Family Medicine

## 2013-01-01 VITALS — BP 129/74 | HR 71 | Temp 97.0°F | Ht 66.0 in | Wt 217.8 lb

## 2013-01-01 DIAGNOSIS — Z860100 Personal history of colon polyps, unspecified: Secondary | ICD-10-CM

## 2013-01-01 DIAGNOSIS — K219 Gastro-esophageal reflux disease without esophagitis: Secondary | ICD-10-CM

## 2013-01-01 DIAGNOSIS — E785 Hyperlipidemia, unspecified: Secondary | ICD-10-CM | POA: Insufficient documentation

## 2013-01-01 DIAGNOSIS — E119 Type 2 diabetes mellitus without complications: Secondary | ICD-10-CM

## 2013-01-01 DIAGNOSIS — I10 Essential (primary) hypertension: Secondary | ICD-10-CM

## 2013-01-01 DIAGNOSIS — J61 Pneumoconiosis due to asbestos and other mineral fibers: Secondary | ICD-10-CM

## 2013-01-01 DIAGNOSIS — I2510 Atherosclerotic heart disease of native coronary artery without angina pectoris: Secondary | ICD-10-CM | POA: Insufficient documentation

## 2013-01-01 DIAGNOSIS — E669 Obesity, unspecified: Secondary | ICD-10-CM

## 2013-01-01 DIAGNOSIS — Z23 Encounter for immunization: Secondary | ICD-10-CM

## 2013-01-01 LAB — POCT UA - MICROALBUMIN: Microalbumin Ur, POC: 20 mg/L

## 2013-01-01 LAB — POCT GLYCOSYLATED HEMOGLOBIN (HGB A1C): Hemoglobin A1C: 7.6

## 2013-01-01 NOTE — Patient Instructions (Addendum)
Dr Woodroe Mode Recommendations  For nutrition information, I recommend books:  1).Eat to Live by Dr Monico Hoar. 2).Prevent and Reverse Heart Disease by Dr Suzzette Righter. 3) Dr Katherina Right Book:  Program to Reverse Diabetes  Exercise recommendations are:  If unable to walk, then the patient can exercise in a chair 3 times a day. By flapping arms like a bird gently and raising legs outwards to the front.  If ambulatory, the patient can go for walks for 30 minutes 3 times a week. Then increase the intensity and duration as tolerated.  Goal is to try to attain exercise frequency to 5 times a week.  If applicable: Best to perform resistance exercises (machines or weights) 2 days a week and cardio type exercises 3 days per week.   Pneumococcal Vaccine, Polyvalent suspension for injection What is this medicine? PNEUMOCOCCAL VACCINE, POLYVALENT (NEU mo KOK al vak SEEN, pol ee VEY luhnt) is a vaccine to prevent pneumococcus bacteria infection. These bacteria are a major cause of ear infections, 'Strep throat' infections, and serious pneumonia, meningitis, or blood infections worldwide. These vaccines help the body to produce antibodies (protective substances) that help your body defend against these bacteria. This vaccine is recommended for infants and young children. This vaccine will not treat an infection. This medicine may be used for other purposes; ask your health care provider or pharmacist if you have questions. COMMON BRAND NAME(S): Prevnar 13 , Prevnar What should I tell my health care provider before I take this medicine? They need to know if you have any of these conditions: -bleeding problems -fever -immune system problems -low platelet count in the blood -seizures -an unusual or allergic reaction to pneumococcal vaccine, diphtheria toxoid, other vaccines, latex, other medicines, foods, dyes, or preservatives -pregnant or trying to get  pregnant -breast-feeding How should I use this medicine? This vaccine is for injection into a muscle. It is given by a health care professional. A copy of Vaccine Information Statements will be given before each vaccination. Read this sheet carefully each time. The sheet may change frequently. Talk to your pediatrician regarding the use of this medicine in children. While this drug may be prescribed for children as young as 92 weeks old for selected conditions, precautions do apply. Overdosage: If you think you have taken too much of this medicine contact a poison control center or emergency room at once. NOTE: This medicine is only for you. Do not share this medicine with others. What if I miss a dose? It is important not to miss your dose. Call your doctor or health care professional if you are unable to keep an appointment. What may interact with this medicine? -medicines for cancer chemotherapy -medicines that suppress your immune function -medicines that treat or prevent blood clots like warfarin, enoxaparin, and dalteparin -steroid medicines like prednisone or cortisone This list may not describe all possible interactions. Give your health care provider a list of all the medicines, herbs, non-prescription drugs, or dietary supplements you use. Also tell them if you smoke, drink alcohol, or use illegal drugs. Some items may interact with your medicine. What should I watch for while using this medicine? Mild fever and pain should go away in 3 days or less. Report any unusual symptoms to your doctor or health care professional. What side effects may I notice from receiving this medicine? Side effects that you should report to your doctor or health care professional as soon as possible: -allergic reactions like skin  rash, itching or hives, swelling of the face, lips, or tongue -breathing problems -confused -fever over 102 degrees F -pain, tingling, numbness in the hands or  feet -seizures -unusual bleeding or bruising -unusual muscle weakness Side effects that usually do not require medical attention (report to your doctor or health care professional if they continue or are bothersome): -aches and pains -diarrhea -fever of 102 degrees F or less -headache -irritable -loss of appetite -pain, tender at site where injected -trouble sleeping This list may not describe all possible side effects. Call your doctor for medical advice about side effects. You may report side effects to FDA at 1-800-FDA-1088. Where should I keep my medicine? This does not apply. This vaccine is given in a clinic, pharmacy, doctor's office, or other health care setting and will not be stored at home. NOTE: This sheet is a summary. It may not cover all possible information. If you have questions about this medicine, talk to your doctor, pharmacist, or health care provider.  2014, Elsevier/Gold Standard. (2008-03-12 10:17:22)   Tetanus, Diphtheria (Td) Vaccine What You Need to Know WHY GET VACCINATED? Tetanus  and diphtheria are very serious diseases. They are rare in the [place] today, but people who do become infected often have severe complications. Td vaccine is used to protect adolescents and adults from both of these diseases. Both tetanus and diphtheria are infections caused by bacteria. Diphtheria spreads from person to person through coughing or sneezing. Tetanus-causing bacteria enter the body through cuts, scratches, or wounds. TETANUS (Lockjaw) causes painful muscle tightening and stiffness, usually all over the body.  It can lead to tightening of muscles in the head and neck so you can't open your mouth, swallow, or sometimes even breathe. Tetanus kills about 1 out of every 5 people who are infected. DIPHTHERIA can cause a thick coating to form in the back of the throat.  It can lead to breathing problems, paralysis, heart failure, and death. Before vaccines, the  [place] saw as many as 200,000 cases a year of diphtheria and hundreds of cases of tetanus. Since vaccination began, cases of both diseases have dropped by about 99%. TD VACCINE Td vaccine can protect adolescents and adults from tetanus and diphtheria. Td is usually given as a booster dose every 10 years but it can also be given earlier after a severe and dirty wound or burn. Your doctor can give you more information. Td may safely be given at the same time as other vaccines. SOME PEOPLE SHOULD NOT GET THIS VACCINE  If you ever had a life-threatening allergic reaction after a dose of any tetanus or diphtheria containing vaccine, OR if you have a severe allergy to any part of this vaccine, you should not get Td. Tell your doctor if you have any severe allergies.  Talk to your doctor if you:  have epilepsy or another nervous system problem,  had severe pain or swelling after any vaccine containing diphtheria or tetanus,  ever had Guillain Barr Syndrome (GBS),  aren't feeling well on the day the shot is scheduled. RISKS OF A VACCINE REACTION With a vaccine, like any medicine, there is a chance of side effects. These are usually mild and go away on their own. Serious side effects are also possible, but are very rare. Most people who get Td vaccine do not have any problems with it. Mild Problems  following Td (Did not interfere with activities)  Pain where the shot was given (about 8 people in 10)  Redness or swelling where the shot was given (about 1 person in 3)  Mild fever (about 1 person in 15)  Headache or Tiredness (uncommon) Moderate Problems following Td (Interfered with activities, but did not require medical attention)  Fever over 102 F (38.9 C) (rare) Severe Problems  following Td (Unable to perform usual activities; required medical attention)  Swelling, severe pain, bleeding, or redness in the arm where the shot was given (rare). Problems that could happen  after any vaccine:  Brief fainting spells can happen after any medical procedure, including vaccination. Sitting or lying down for about 15 minutes can help prevent fainting, and injuries caused by a fall. Tell your doctor if you feel dizzy, or have vision changes or ringing in the ears.  Severe shoulder pain and reduced range of motion in the arm where a shot was given can happen, very rarely, after a vaccination.  Severe allergic reactions from a vaccine are very rare, estimated at less than 1 in a million doses. If one were to occur, it would usually be within a few minutes to a few hours after the vaccination. WHAT IF THERE IS A SERIOUS REACTION? What should I look for?  Look for anything that concerns you, such as signs of a severe allergic reaction, very high fever, or behavior changes. Signs of a severe allergic reaction can include hives, swelling of the face and throat, difficulty breathing, a fast heartbeat, dizziness, and weakness. These would usually start a few minutes to a few hours after the vaccination. What should I do?  If you think it is a severe allergic reaction or other emergency that can't wait, call 911 or get the person to the nearest hospital. Otherwise, call your doctor.  Afterward, the reaction should be reported to the Vaccine Adverse Event Reporting System (VAERS). Your doctor might file this report, or, you can do it yourself through the VAERS website or by calling 1-(602)624-8670. VAERS is only for reporting reactions. They do not give medical advice. THE NATIONAL VACCINE INJURY COMPENSATION PROGRAM The National Vaccine Injury Compensation Program (VICP) is a federal program that was created to compensate people who may have been injured by certain vaccines. Persons who believe they may have been injured by a vaccine can learn about the program and about filing a claim by calling 1-970-785-6960 or visiting the [facility] website. HOW CAN I LEARN MORE?  Ask your  doctor.  Contact your local or state health department.  Contact the Centers for Disease Control and Prevention (CDC):  Call 272-270-1207 (1-800-CDC-INFO)  Visit CDC's vaccines website CDC Td Vaccine Interim VIS (02/15/12) Document Released: 10/25/2005 Document Revised: 04/24/2012 Document Reviewed: 04/19/2012 [facility] Patient Information 2014 [place], [place].

## 2013-01-01 NOTE — Progress Notes (Signed)
Patient ID: [name], male   DOB: 09-Jan-1941, 72 y.o.   MRN: [id number] SUBJECTIVE: CC: Chief Complaint  Patient presents with  . Diabetes    3 month rck  . Hyperlipidemia  . Hypertension    HPI: Patient is here for follow up of Diabetes Mellitus: Symptoms evaluated: Denies Nocturia ,Denies Urinary Frequency , denies Blurred vision ,deniesDizziness,denies.Dysuria,denies paresthesias, denies extremity pain or ulcers.Marland Kitchendenies chest pain. has had an annual eye exam. do check the feet. Does check CBGs. Average YQM:VHQIONGEXB from low to highs. Has episodes of hypoglycemia when he eats correctly Does have an emergency hypoglycemic plan. admits toCompliance with medications. Denies Problems with medications.   Breakfast : Sausage biscuit  Lunch: 2 hot dogs or fast food Dinner: goes out to eat. eg Beef soup, or chicken casserole   Past Medical History  Diagnosis Date  . Type 2 diabetes mellitus   . Anxiety disorder   . Arthritis   . Essential hypertension, benign   . Coronary atherosclerosis of native coronary artery     BMS nondominant RCA 12/2004  . Acute encephalopathy 08/2010    Final diagnosis was not consistent with West Nile virus.  . Adenomatous polyp   . Gallbladder cholesterolosis   . Diverticulosis   . Internal hemorrhoids   . Nephrolithiasis   . Chronic headaches   . Myocardial infarction     2006  . GERD (gastroesophageal reflux disease)   . Hyperlipidemia    Past Surgical History  Procedure Laterality Date  . Total knee arthroplasty    . Rotator cuff repair    . Joint replacement    . Upper gastrointestinal endoscopy  01/08/2008    bx, inlet patch, duodenitis  . Colonoscopy  09/10/2008    normal  . Cataract extraction    . Cystoscopy w/ ureteroscopy w/ lithotripsy  08/2010  . Cholecystectomy     History   Social History  . Marital Status: Married    Spouse Name: N/A    Number of Children: 0  . Years of Education: N/A   Occupational  History  . 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 Smoking status: Former Smoker -- 1.00 packs/day for 25 years    Types: Cigarettes    Quit date: 08/11/1988  . Smokeless tobacco: Former Neurosurgeon    Types: Chew    Quit date: 01/11/1989     Comment: chewed 1 pack tobacco/day for 15 years  . Alcohol Use: Yes     Comment: Very rarely  . Drug Use: No  . Sexual Activity: Not Currently   Other Topics Concern  . 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 Colon cancer Mother     Diagnosed age 43  . Heart disease Father   . Breast cancer Sister   . Heart disease Brother    Current Outpatient Prescriptions on File Prior to Visit  Medication Sig Dispense Refill  . acetaminophen (TYLENOL) 500 MG tablet Take 500 mg by mouth every 6 (six) hours as needed. Takes 2 every morning      . amLODipine (NORVASC) 5 MG tablet TAKE 1 TABLET BY MOUTH EVERY DAY  90 tablet  0  . aspirin 81 MG tablet Take 81 mg by mouth daily.      Marland Kitchen  atorvastatin (LIPITOR) 20 MG tablet Take 1 tablet (20 mg total) by mouth at bedtime.  90 tablet  0  . butalbital-acetaminophen-caffeine (FIORICET WITH CODEINE) 50-325-40-30 MG per capsule Take 1 capsule by mouth every 4 (four) hours as needed.        . cetirizine (ZYRTEC) 10 MG tablet Take 10 mg by mouth daily.        . chlorthalidone (HYGROTON) 25 MG tablet Take 1 tablet (25 mg total) by mouth daily. As directed.  90 tablet  3  . Cholecalciferol (VITAMIN D3) 2000 UNITS TABS Take 1 tablet by mouth daily.        . famotidine (PEPCID) 20 MG tablet TAKE 1 TABLET BY MOUTH TWICE A DAY *30 MIN. BEFORE BREAKFAST & SUPPER  180 tablet  0  . insulin aspart (NOVOLOG) 100 UNIT/ML injection Inject 0-15 Units into the skin 3 (three) times daily before meals. 70-150=8units 151-200=9units  15 vial  3  .  insulin glargine (LANTUS) 100 UNIT/ML injection Inject 52 Units into the skin at bedtime.       . metFORMIN (GLUMETZA) 500 MG (MOD) 24 hr tablet Take 2 tablets (1,000 mg total) by mouth 2 (two) times daily with a meal. HOLD for 48 hours, restart on 05/17/2011.      Marland Kitchen NITROSTAT 0.4 MG SL tablet USE AS DIRECTED AS NEEDED FOR CHEST PAIN  25 each  1  . PARoxetine (PAXIL) 20 MG tablet Take 20 mg by mouth at bedtime.       . prazosin (MINIPRESS) 1 MG capsule TAKE ONE CAPSULE BY MOUTH TWICE A DAY  180 capsule  0   No current facility-administered medications on file prior to visit.   Allergies  Allergen Reactions  . Ace Inhibitors   . Angiotensin Receptor Blockers   . Ciprofloxacin     confusion  . Doxycycline   . Oxycodone-Acetaminophen     REACTION: unknown reaction  . Ramipril   . Toradol [Ketorolac Tromethamine]     confusion  . Valium     Hallucinations; confusion   Immunization History  Administered Date(s) Administered  . Pneumococcal Conjugate-13 01/01/2013  . Tdap 01/01/2013   Prior to Admission medications   Medication Sig Start Date End Date Taking? Authorizing Provider  acetaminophen (TYLENOL) 500 MG tablet Take 500 mg by mouth every 6 (six) hours as needed. Takes 2 every morning   Yes Historical Provider, MD  amLODipine (NORVASC) 5 MG tablet TAKE 1 TABLET BY MOUTH EVERY DAY 12/24/11  Yes Rande Brunt, PA-C  aspirin 81 MG tablet Take 81 mg by mouth daily.   Yes Historical Provider, MD  atorvastatin (LIPITOR) 20 MG tablet Take 1 tablet (20 mg total) by mouth at bedtime. 04/18/12  Yes Ernestina Penna, MD  butalbital-acetaminophen-caffeine (FIORICET WITH CODEINE) 6192170848 MG per capsule Take 1 capsule by mouth every 4 (four) hours as needed.     Yes Historical Provider, MD  cetirizine (ZYRTEC) 10 MG tablet Take 10 mg by mouth daily.     Yes Historical Provider, MD  chlorthalidone (HYGROTON) 25 MG tablet Take 1 tablet (25 mg total) by mouth daily. As directed. 12/26/12  Yes  Jonelle Sidle, MD  Cholecalciferol (VITAMIN D3) 2000 UNITS TABS Take 1 tablet by mouth daily.     Yes Historical Provider, MD  famotidine (PEPCID) 20 MG tablet TAKE 1 TABLET BY MOUTH TWICE A DAY *30 MIN. BEFORE BREAKFAST & SUPPER 11/03/11  Yes Iva Boop, MD  insulin aspart (NOVOLOG) 100 UNIT/ML  injection Inject 0-15 Units into the skin 3 (three) times daily before meals. 70-150=8units 151-200=9units 09/22/12  Yes Ileana Ladd, MD  insulin glargine (LANTUS) 100 UNIT/ML injection Inject 52 Units into the skin at bedtime.    Yes Historical Provider, MD  metFORMIN (GLUMETZA) 500 MG (MOD) 24 hr tablet Take 2 tablets (1,000 mg total) by mouth 2 (two) times daily with a meal. HOLD for 48 hours, restart on 05/17/2011. 05/14/11  Yes Rhonda G Barrett, PA-C  NITROSTAT 0.4 MG SL tablet USE AS DIRECTED AS NEEDED FOR CHEST PAIN 09/30/11  Yes Rande Brunt, PA-C  PARoxetine (PAXIL) 20 MG tablet Take 20 mg by mouth at bedtime.    Yes Historical Provider, MD  prazosin (MINIPRESS) 1 MG capsule TAKE ONE CAPSULE BY MOUTH TWICE A DAY 02/16/12  Yes Eugene C Serpe, PA-C     ROS: As above in the HPI. All other systems are stable or negative.  OBJECTIVE: APPEARANCE:  Patient in no acute distress.The patient appeared well nourished and normally developed. Acyanotic. Waist: VITAL SIGNS:BP 129/74  Pulse 71  Temp(Src) 97 F (36.1 C) (Oral)  Ht 5\' 6"  (1.676 m)  Wt 217 lb 12.8 oz (98.793 kg)  BMI 35.17 kg/m2  OBese WM  SKIN: warm and  Dry without overt rashes, tattoos and scars  HEAD and Neck: without JVD, Head and scalp: normal Eyes:No scleral icterus. Fundi normal, eye movements normal. Ears: Auricle normal, canal normal, Tympanic membranes normal, insufflation normal. Nose: normal Throat: normal Neck & thyroid: normal  CHEST & LUNGS: Chest wall: normal Lungs: Clear  CVS: Reveals the PMI to be normally located. Regular rhythm, First and Second Heart sounds are normal,  absence of murmurs, rubs  or gallops. Peripheral vasculature: Radial pulses: normal Dorsal pedis pulses: normal Posterior pulses: normal  ABDOMEN:  Appearance: cintral Obese Benign, no organomegaly, no masses, no Abdominal Aortic enlargement. No Guarding , no rebound. No Bruits. Bowel sounds: normal  RECTAL: N/A GU: N/A  EXTREMETIES: nonedematous.  MUSCULOSKELETAL:  Spine: normal Joints: intact  NEUROLOGIC: oriented to time,place and person; nonfocal. Strength is normal Sensory is normal Reflexes are normal Cranial Nerves are normal.  Results for orders placed in visit on 01/01/13  POCT GLYCOSYLATED HEMOGLOBIN (HGB A1C)      Result Value Range   Hemoglobin A1C 7.6    POCT UA - MICROALBUMIN      Result Value Range   Microalbumin Ur, POC 20      ASSESSMENT:  ASBESTOSIS  Essential hypertension, benign - Plan: CMP14+EGFR  Hyperlipidemia - Plan: Lipid panel  CAD (coronary artery disease)  DM (diabetes mellitus) - Plan: POCT glycosylated hemoglobin (Hb A1C), CMP14+EGFR, POCT UA - Microalbumin, Microalbumin, urine  Obesity, unspecified  COLONIC POLYPS, ADENOMATOUS, HX OF  GERD (gastroesophageal reflux disease)  Need for prophylactic vaccination against Streptococcus pneumoniae (pneumococcus) - Plan: Pneumococcal conjugate vaccine 13-valent  Need for Tdap vaccination - Plan: Tdap vaccine greater than or equal to 7yo IM  Having hypoglycemic episodes when he eats healthy and appropriately for DM. He needs to scale back on his insulin and monitor under guidance. i advised him to see  Our clinical Pharmacists and he declined because he has the [employer] home visit program. He was concerned about costs.   PLAN:      Dr Woodroe Mode Recommendations  For nutrition information, I recommend books:  1).Eat to Live by Dr Monico Hoar. 2).Prevent and Reverse Heart Disease by Dr Suzzette Righter. 3) Dr Katherina Right Book:  Program to Reverse Diabetes  Exercise  recommendations are:  If unable to walk, then the patient can exercise in a chair 3 times a day. By flapping arms like a bird gently and raising legs outwards to the front.  If ambulatory, the patient can go for walks for 30 minutes 3 times a week. Then increase the intensity and duration as tolerated.  Goal is to try to attain exercise frequency to 5 times a week.  If applicable: Best to perform resistance exercises (machines or weights) 2 days a week and cardio type exercises 3 days per week.   Discussed a plant based diet and the drops in the glucose and reduced need for insulin.  Same medications. Counseled on scaling back by 5 units at a time and gradual changing over of his diet to include more healthier choices.  Orders Placed This Encounter  Procedures  . Pneumococcal conjugate vaccine 13-valent  . Tdap vaccine greater than or equal to 7yo IM  . CMP14+EGFR  . Lipid panel  . Microalbumin, urine  . POCT glycosylated hemoglobin (Hb A1C)  . POCT UA - Microalbumin   No orders of the defined types were placed in this encounter.   There are no discontinued medications. Return in about 3 months (around 04/01/2013) for Recheck medical problems.  Aniyah Nobis P. [place]esto Charon, M.D.

## 2013-01-02 LAB — CMP14+EGFR
ALT: 29 IU/L (ref 0–44)
AST: 27 IU/L (ref 0–40)
Albumin/Globulin Ratio: 1.5 (ref 1.1–2.5)
Albumin: 4.4 g/dL (ref 3.5–4.8)
Alkaline Phosphatase: 69 IU/L (ref 39–117)
BUN/Creatinine Ratio: 16 (ref 10–22)
BUN: 15 mg/dL (ref 8–27)
CO2: 26 mmol/L (ref 18–29)
Calcium: 9.6 mg/dL (ref 8.6–10.2)
Chloride: 97 mmol/L (ref 97–108)
Creatinine, Ser: 0.92 mg/dL (ref 0.76–1.27)
GFR calc Af Amer: 96 mL/min/{1.73_m2} (ref >59)
GFR calc non Af Amer: 83 mL/min/{1.73_m2} (ref >59)
Globulin, Total: 2.9 g/dL (ref 1.5–4.5)
Glucose: 152 mg/dL — ABNORMAL HIGH (ref 65–99)
Potassium: 3.8 mmol/L (ref 3.5–5.2)
Sodium: 139 mmol/L (ref 134–144)
Total Bilirubin: 0.7 mg/dL (ref 0.0–1.2)
Total Protein: 7.3 g/dL (ref 6.0–8.5)

## 2013-01-02 LAB — LIPID PANEL
Chol/HDL Ratio: 3.9 ratio units (ref 0.0–5.0)
Cholesterol, Total: 143 mg/dL (ref 100–199)
HDL: 37 mg/dL — ABNORMAL LOW (ref >39)
LDL Calculated: 74 mg/dL (ref 0–99)
Triglycerides: 160 mg/dL — ABNORMAL HIGH (ref 0–149)
VLDL Cholesterol Cal: 32 mg/dL (ref 5–40)

## 2013-01-02 LAB — MICROALBUMIN, URINE: Microalbumin, Urine: 23.6 ug/mL — ABNORMAL HIGH (ref 0.0–17.0)

## 2013-01-05 ENCOUNTER — Ambulatory Visit: Payer: Medicare Other | Admitting: Family Medicine

## 2013-01-08 NOTE — Progress Notes (Signed)
Quick Note:  Call Patient Labs that are abnormal: HGBA1C not at goal The triglycerides is close to goal but elevated due to the sugar not at goal.  The rest are at goal  Recommendations: I recommend him to see Tammy to review his medications so we can get better control. i believe he is seeing another nutritionist but I would feel better if Tammy looked at his medications for adjustment.   ______

## 2013-01-17 ENCOUNTER — Other Ambulatory Visit: Payer: Self-pay | Admitting: Family Medicine

## 2013-01-18 ENCOUNTER — Other Ambulatory Visit: Payer: Self-pay

## 2013-01-18 LAB — HM DIABETES EYE EXAM

## 2013-01-18 MED ORDER — INSULIN PEN NEEDLE 31G X 8 MM MISC: 4.0000 | Freq: Four times a day (QID) | Status: DC

## 2013-01-18 MED ORDER — INSULIN GLARGINE 100 UNIT/ML ~~LOC~~ SOLN: 52.0000 [IU] | Freq: Every day | SUBCUTANEOUS | Status: DC

## 2013-03-07 ENCOUNTER — Other Ambulatory Visit: Payer: Self-pay | Admitting: Family Medicine

## 2013-03-21 ENCOUNTER — Telehealth: Payer: Self-pay | Admitting: Family Medicine

## 2013-04-04 ENCOUNTER — Ambulatory Visit (INDEPENDENT_AMBULATORY_CARE_PROVIDER_SITE_OTHER): Payer: Medicare Other | Admitting: Cardiology

## 2013-04-04 ENCOUNTER — Encounter: Payer: Self-pay | Admitting: Cardiology

## 2013-04-04 VITALS — BP 148/83 | HR 74 | Ht 66.0 in | Wt 219.0 lb

## 2013-04-04 DIAGNOSIS — E785 Hyperlipidemia, unspecified: Secondary | ICD-10-CM

## 2013-04-04 DIAGNOSIS — I10 Essential (primary) hypertension: Secondary | ICD-10-CM

## 2013-04-04 DIAGNOSIS — I2510 Atherosclerotic heart disease of native coronary artery without angina pectoris: Secondary | ICD-10-CM

## 2013-04-04 NOTE — Assessment & Plan Note (Addendum)
Patient continues on Lipitor, LDL 74.

## 2013-04-04 NOTE — Assessment & Plan Note (Signed)
No active angina symptoms. Continue medical therapy and observation. 

## 2013-04-04 NOTE — Patient Instructions (Signed)
Your physician recommends that you schedule a follow-up appointment in: 6 months with Dr. McDowell. You should receive a letter in the mail in 4 months. If you do not receive this letter by July 2015 call our office to schedule this appointment.   Your physician recommends that you continue on your current medications as directed. Please refer to the Current Medication list given to you today.  

## 2013-04-04 NOTE — Progress Notes (Signed)
Clinical Summary Mr. Stettner is a 73 y.o.male last seen in September 2014. He denies any angina symptoms. 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 He is somewhat more fatigued as he gets older. States his weight has gone up and his blood pressures has also been mildly elevated. He was not able to come off of chlorthalidone.  Lab work from December 2014 showed cholesterol 143, triglycerides 160, HDL 37, LDL 74, BUN 15, creatinine 0.9, potassium 3.8, normal LFTs.  Cardiac catheterization May 2013 demonstrated nonobstructive disease with patent stent site in the RCA which was a small and severely diseased, nondominant vessel. Medical therapy was most appropriate.   Allergies  Allergen Reactions  . Ace Inhibitors   . Angiotensin Receptor Blockers   . Ciprofloxacin     confusion  . Doxycycline   . Oxycodone-Acetaminophen     REACTION: unknown reaction  . Ramipril   . Toradol [Ketorolac Tromethamine]     confusion  . Valium     Hallucinations; confusion    Current Outpatient Prescriptions  Medication Sig Dispense Refill  . acetaminophen (TYLENOL) 500 MG tablet Take 500 mg by mouth every 6 (six) hours as needed. Takes 2 every morning      . amLODipine (NORVASC) 5 MG tablet TAKE 1 TABLET DAILY  90 tablet  0  . aspirin 81 MG tablet Take 81 mg by mouth daily.      Marland Kitchen atorvastatin (LIPITOR) 20 MG tablet Take 1 tablet (20 mg total) by mouth at bedtime.  90 tablet  0  . B-D ULTRAFINE III SHORT PEN 31G X 8 MM MISC USE 4 TIMES DAILY  100 each  2  . butalbital-acetaminophen-caffeine (FIORICET WITH CODEINE) 50-325-40-30 MG per capsule Take 1 capsule by mouth every 4 (four) hours as needed.        . cetirizine (ZYRTEC) 10 MG tablet Take 10 mg by mouth daily.        . chlorthalidone (HYGROTON) 25 MG tablet Take 1 tablet (25 mg total) by mouth daily. As directed.  90 tablet  3  . Cholecalciferol (VITAMIN D3) 2000 UNITS TABS Take 1 tablet by mouth daily.        .  famotidine (PEPCID) 20 MG tablet TAKE 1 TABLET BY MOUTH TWICE A DAY *30 MIN. BEFORE BREAKFAST & SUPPER  180 tablet  0  . insulin aspart (NOVOLOG) 100 UNIT/ML injection Inject 0-15 Units into the skin 3 (three) times daily before meals. 70-150=8units 151-200=9units  15 vial  3  . insulin glargine (LANTUS) 100 UNIT/ML injection Inject 0.52 mLs (52 Units total) into the skin at bedtime.  10 mL  1  . Insulin Pen Needle 31G X 8 MM MISC 4 Devices by Does not apply route 4 (four) times daily.  400 each  1  . metFORMIN (GLUMETZA) 500 MG (MOD) 24 hr tablet Take 2 tablets (1,000 mg total) by mouth 2 (two) times daily with a meal. HOLD for 48 hours, restart on 05/17/2011.      Marland Kitchen NITROSTAT 0.4 MG SL tablet USE AS DIRECTED AS NEEDED FOR CHEST PAIN  25 each  1  . PARoxetine (PAXIL) 20 MG tablet Take 20 mg by mouth at bedtime.       . prazosin (MINIPRESS) 1 MG capsule TAKE ONE CAPSULE BY MOUTH TWICE A DAY  180 capsule  0   No current facility-administered medications for this visit.    Past Medical History  Diagnosis Date  . Type  2 diabetes mellitus   . Anxiety disorder   . Arthritis   . Essential hypertension, benign   . Coronary atherosclerosis of native coronary artery     BMS nondominant RCA 12/2004  . Acute encephalopathy 08/2010    Final diagnosis was not consistent with West Nile virus.  . Adenomatous polyp   . Gallbladder cholesterolosis   . Diverticulosis   . Internal hemorrhoids   . Nephrolithiasis   . Chronic headaches   . Myocardial infarction     2006  . GERD (gastroesophageal reflux disease)   . Hyperlipidemia     Social History Mr. Hargadon reports that he quit smoking about 24 years ago. His smoking use included Cigarettes. He has a 25 pack-year smoking history. He quit smokeless tobacco use about 24 years ago. His smokeless tobacco use included Chew. Mr. Clayson reports that he drinks alcohol.  Review of Systems No syncope. No orthopnea or PND. Otherwise as outlined.  Physical  Examination Filed Vitals:   04/04/13 0811  BP: 148/83  Pulse: 74   Filed Weights   04/04/13 0811  Weight: 219 lb (99.338 kg)    Appears comfortable at rest.  HEENT: Conjunctiva and lids normal, oropharynx clear.  Neck: Supple, no elevated JVP or carotid bruits, no thyromegaly.  Lungs: Clear to auscultation, nonlabored breathing at rest.  Cardiac: Regular rate and rhythm, no S3 or significant systolic murmur, no pericardial rub.  Abdomen: Soft, nontender, bowel sounds present, no guarding or rebound.  Extremities: No pitting edema, distal pulses 2+.  Skin: Warm and dry.  Musculoskeletal: No kyphosis.  Neuropsychiatric: Alert and oriented x3, affect grossly appropriate.   Problem List and Plan   CORONARY ATHEROSCLEROSIS NATIVE CORONARY ARTERY No active angina symptoms. Continue medical therapy and observation.  Essential hypertension, benign Blood pressure is mildly elevated. He reports compliance with medications. I have recommended weight loss and diet.  HYPERLIPIDEMIA-MIXED Patient continues on Lipitor, LDL 74.    Satira Sark, M.D., F.A.C.C.

## 2013-04-04 NOTE — Assessment & Plan Note (Signed)
Blood pressure is mildly elevated. He reports compliance with medications. I have recommended weight loss and diet.

## 2013-04-10 ENCOUNTER — Ambulatory Visit (INDEPENDENT_AMBULATORY_CARE_PROVIDER_SITE_OTHER): Payer: Medicare Other | Admitting: Family Medicine

## 2013-04-10 ENCOUNTER — Encounter: Payer: Self-pay | Admitting: Family Medicine

## 2013-04-10 VITALS — BP 124/70 | HR 66 | Temp 97.9°F | Ht 66.0 in | Wt 220.4 lb

## 2013-04-10 DIAGNOSIS — J61 Pneumoconiosis due to asbestos and other mineral fibers: Secondary | ICD-10-CM

## 2013-04-10 DIAGNOSIS — I10 Essential (primary) hypertension: Secondary | ICD-10-CM

## 2013-04-10 DIAGNOSIS — E119 Type 2 diabetes mellitus without complications: Secondary | ICD-10-CM

## 2013-04-10 DIAGNOSIS — I2510 Atherosclerotic heart disease of native coronary artery without angina pectoris: Secondary | ICD-10-CM | POA: Insufficient documentation

## 2013-04-10 DIAGNOSIS — E669 Obesity, unspecified: Secondary | ICD-10-CM

## 2013-04-10 DIAGNOSIS — M19079 Primary osteoarthritis, unspecified ankle and foot: Secondary | ICD-10-CM

## 2013-04-10 DIAGNOSIS — E785 Hyperlipidemia, unspecified: Secondary | ICD-10-CM | POA: Insufficient documentation

## 2013-04-10 DIAGNOSIS — R413 Other amnesia: Secondary | ICD-10-CM

## 2013-04-10 DIAGNOSIS — K219 Gastro-esophageal reflux disease without esophagitis: Secondary | ICD-10-CM

## 2013-04-10 LAB — POCT GLYCOSYLATED HEMOGLOBIN (HGB A1C): Hemoglobin A1C: 7.8

## 2013-04-10 NOTE — Progress Notes (Signed)
Patient ID: [name], male   DOB: July 31, 1940, 73 y.o.   MRN: [id number] SUBJECTIVE: CC: Chief Complaint  Patient presents with  . Follow-up    3 month follow up chronic problems     HPI:  Patient is here for follow up of Diabetes Mellitus: Symptoms evaluated: Denies Nocturia ,Denies Urinary Frequency , denies Blurred vision ,deniesDizziness,denies.Dysuria,denies paresthesias, denies extremity pain or ulcers.Marland Kitchendenies chest pain. has had an annual eye exam. do check the feet. Does check CBGs. Average YOV:ZCHYI better Denies episodes of hypoglycemia. Does have an emergency hypoglycemic plan. admits toCompliance with medications. Denies Problems with medications.  Short term memory is not so good as before.  Past Medical History  Diagnosis Date  . Type 2 diabetes mellitus   . Anxiety disorder   . Arthritis   . Essential hypertension, benign   . Coronary atherosclerosis of native coronary artery     BMS nondominant RCA 12/2004  . Acute encephalopathy 08/2010    Final diagnosis was not consistent with West Nile virus.  . Adenomatous polyp   . Gallbladder cholesterolosis   . Diverticulosis   . Internal hemorrhoids   . Nephrolithiasis   . Chronic headaches   . Myocardial infarction     2006  . GERD (gastroesophageal reflux disease)   . Hyperlipidemia    Past Surgical History  Procedure Laterality Date  . Total knee arthroplasty    . Rotator cuff repair    . Joint replacement    . Upper gastrointestinal endoscopy  01/08/2008    bx, inlet patch, duodenitis  . Colonoscopy  09/10/2008    normal  . Cataract extraction    . Cystoscopy w/ ureteroscopy w/ lithotripsy  08/2010  . Cholecystectomy     History   Social History  . Marital Status: Married    Spouse Name: N/A    Number of Children: 0  . Years of Education: N/A   Occupational History  . 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 Smoking status: Former Smoker -- 1.00 packs/day for 25  years    Types: Cigarettes    Quit date: 08/11/1988  . Smokeless tobacco: Former Systems developer    Types: Chew    Quit date: 01/11/1989     Comment: chewed 1 pack tobacco/day for 15 years  . Alcohol Use: Yes     Comment: Very rarely  . Drug Use: No  . Sexual Activity: Not Currently   Other Topics Concern  . 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 Colon cancer Mother     Diagnosed age 18  . Heart disease Father   . Breast cancer Sister   . Heart disease Brother    Current Outpatient Prescriptions on File Prior to Visit  Medication Sig Dispense Refill  . acetaminophen (TYLENOL) 500 MG tablet Take 500 mg by mouth every 6 (six) hours as needed. Takes 2 every morning      . amLODipine (NORVASC) 5 MG tablet TAKE 1 TABLET DAILY  90 tablet  0  . aspirin 81 MG tablet Take 81 mg by mouth daily.      Marland Kitchen atorvastatin (LIPITOR) 20 MG tablet Take 1 tablet (20 mg total) by mouth at bedtime.  90 tablet  0  . B-D ULTRAFINE  III SHORT PEN 31G X 8 MM MISC USE 4 TIMES DAILY  100 each  2  . butalbital-acetaminophen-caffeine (FIORICET WITH CODEINE) 50-325-40-30 MG per capsule Take 1 capsule by mouth every 4 (four) hours as needed.        . cetirizine (ZYRTEC) 10 MG tablet Take 10 mg by mouth daily.        . chlorthalidone (HYGROTON) 25 MG tablet Take 1 tablet (25 mg total) by mouth daily. As directed.  90 tablet  3  . Cholecalciferol (VITAMIN D3) 2000 UNITS TABS Take 1 tablet by mouth daily.        . famotidine (PEPCID) 20 MG tablet TAKE 1 TABLET BY MOUTH TWICE A DAY *30 MIN. BEFORE BREAKFAST & SUPPER  180 tablet  0  . insulin aspart (NOVOLOG) 100 UNIT/ML injection Inject 0-15 Units into the skin 3 (three) times daily before meals. 70-150=8units 151-200=9units  15 vial  3  . insulin glargine (LANTUS) 100 UNIT/ML injection Inject 0.52 mLs (52  Units total) into the skin at bedtime.  10 mL  1  . Insulin Pen Needle 31G X 8 MM MISC 4 Devices by Does not apply route 4 (four) times daily.  400 each  1  . metFORMIN (GLUMETZA) 500 MG (MOD) 24 hr tablet Take 2 tablets (1,000 mg total) by mouth 2 (two) times daily with a meal. HOLD for 48 hours, restart on 05/17/2011.      Marland Kitchen NITROSTAT 0.4 MG SL tablet USE AS DIRECTED AS NEEDED FOR CHEST PAIN  25 each  1  . PARoxetine (PAXIL) 20 MG tablet Take 20 mg by mouth at bedtime.       . prazosin (MINIPRESS) 1 MG capsule TAKE ONE CAPSULE BY MOUTH TWICE A DAY  180 capsule  0   No current facility-administered medications on file prior to visit.   Allergies  Allergen Reactions  . Ace Inhibitors   . Angiotensin Receptor Blockers   . Ciprofloxacin     confusion  . Doxycycline   . Oxycodone-Acetaminophen     REACTION: unknown reaction  . Ramipril   . Toradol [Ketorolac Tromethamine]     confusion  . Valium     Hallucinations; confusion   Immunization History  Administered Date(s) Administered  . Pneumococcal Conjugate-13 01/01/2013  . Tdap 01/01/2013   Prior to Admission medications   Medication Sig Start Date End Date Taking? Authorizing Provider  acetaminophen (TYLENOL) 500 MG tablet Take 500 mg by mouth every 6 (six) hours as needed. Takes 2 every morning   Yes Historical Provider, MD  amLODipine (NORVASC) 5 MG tablet TAKE 1 TABLET DAILY   Yes Vernie Shanks, MD  aspirin 81 MG tablet Take 81 mg by mouth daily.   Yes Historical Provider, MD  atorvastatin (LIPITOR) 20 MG tablet Take 1 tablet (20 mg total) by mouth at bedtime. 04/18/12  Yes Chipper Herb, MD  B-D ULTRAFINE III SHORT PEN 31G X 8 MM MISC USE 4 TIMES DAILY 01/17/13  Yes Chipper Herb, MD  butalbital-acetaminophen-caffeine (FIORICET WITH CODEINE) [id number] MG per capsule Take 1 capsule by mouth every 4 (four) hours as needed.     Yes Historical Provider, MD  cetirizine (ZYRTEC) 10 MG tablet Take 10 mg by mouth daily.     Yes  Historical Provider, MD  chlorthalidone (HYGROTON) 25 MG tablet Take 1 tablet (25 mg total) by mouth daily. As directed. 12/26/12  Yes Satira Sark, MD  Cholecalciferol (VITAMIN D3) 2000 UNITS TABS Take 1  tablet by mouth daily.     Yes Historical Provider, MD  famotidine (PEPCID) 20 MG tablet TAKE 1 TABLET BY MOUTH TWICE A DAY *30 MIN. BEFORE BREAKFAST & SUPPER 11/03/11  Yes Gatha Mayer, MD  insulin aspart (NOVOLOG) 100 UNIT/ML injection Inject 0-15 Units into the skin 3 (three) times daily before meals. 70-150=8units 151-200=9units 09/22/12  Yes Vernie Shanks, MD  insulin glargine (LANTUS) 100 UNIT/ML injection Inject 0.52 mLs (52 Units total) into the skin at bedtime. 01/18/13  Yes Vernie Shanks, MD  Insulin Pen Needle 31G X 8 MM MISC 4 Devices by Does not apply route 4 (four) times daily. 01/18/13  Yes Vernie Shanks, MD  metFORMIN (GLUMETZA) 500 MG (MOD) 24 hr tablet Take 2 tablets (1,000 mg total) by mouth 2 (two) times daily with a meal. HOLD for 48 hours, restart on 05/17/2011. 05/14/11  Yes Rhonda G Barrett, PA-C  NITROSTAT 0.4 MG SL tablet USE AS DIRECTED AS NEEDED FOR CHEST PAIN 09/30/11  Yes Donney Dice, PA-C  PARoxetine (PAXIL) 20 MG tablet Take 20 mg by mouth at bedtime.    Yes Historical Provider, MD  prazosin (MINIPRESS) 1 MG capsule TAKE ONE CAPSULE BY MOUTH TWICE A DAY 02/16/12  Yes Eugene C Serpe, PA-C     ROS: As above in the HPI. All other systems are stable or negative.  OBJECTIVE: APPEARANCE:  Patient in no acute distress.The patient appeared well nourished and normally developed. Acyanotic. Waist: VITAL SIGNS:BP 124/70  Pulse 66  Temp(Src) 97.9 F (36.6 C) (Oral)  Ht _0  (1.676 m)  Wt 220 lb 6.4 oz (99.973 kg)  BMI 35.59 kg/m2  WM obese.  SKIN: warm and  Dry without overt rashes, tattoos and scars  HEAD and Neck: without JVD, Head and scalp: normal Eyes:No scleral icterus. Fundi normal, eye movements normal. Ears: Auricle normal, canal normal, Tympanic  membranes normal, insufflation normal. Nose: normal Throat: normal Neck & thyroid: normal  CHEST & LUNGS: Chest wall: normal Lungs: Clear  CVS: Reveals the PMI to be normally located. Regular rhythm, First and Second Heart sounds are normal,  absence of murmurs, rubs or gallops. Peripheral vasculature: Radial pulses: normal Dorsal pedis pulses: normal Posterior pulses: normal  ABDOMEN:  Appearance: Obese Benign, no organomegaly, no masses, no Abdominal Aortic enlargement. No Guarding , no rebound. No Bruits. Bowel sounds: normal  RECTAL: N/A GU: N/A  EXTREMETIES: nonedematous.  MUSCULOSKELETAL:  Spine: normal Joints: right first MT-P joint sore. This is chronic.   NEUROLOGIC: oriented to time,place and person; nonfocal. Strength is normal Sensory is normal Reflexes are normal Cranial Nerves are normal.  ASSESSMENT:   DM (diabetes mellitus) - Plan: POCT glycosylated hemoglobin (Hb A1C)  Obesity, unspecified  Hyperlipidemia - Plan: NMR, lipoprofile  Essential hypertension, benign - Plan: CMP14+EGFR  CAD (coronary artery disease)  Asbestosis(501)  GERD (gastroesophageal reflux disease)  Memory impairment - short term - Plan: TSH, Vitamin B12, Folate, RPR  Arthritis of big toe - Plan: Uric acid  PLAN:      Dr Paula Libra Recommendations  For nutrition information, I recommend books:  1).Eat to Live by Dr Excell Seltzer. 2).Prevent and Reverse Heart Disease by Dr Karl Luke. 3) Dr Janene Harvey Book:  Program to Reverse Diabetes  Exercise recommendations are:  If unable to walk, then the patient can exercise in a chair 3 times a day. By flapping arms like a bird gently and raising legs outwards to the front.  If ambulatory, the patient  can go for walks for 30 minutes 3 times a week. Then increase the intensity and duration as tolerated.  Goal is to try to attain exercise frequency to 5 times a week.  If applicable: Best to perform  resistance exercises (machines or weights) 2 days a week and cardio type exercises 3 days per week.   DM foot care in the AVS Obesity handout in the AVS.   Same medications.  Orders Placed This Encounter  Procedures  . CMP14+EGFR  . NMR, lipoprofile  . Uric acid  . TSH  . Vitamin B12  . Folate  . RPR  . POCT glycosylated hemoglobin (Hb A1C)    MMSE 29/30.   Discussed about dementia. And medications. Patient has no interest for dementia medications. Discussed the MMSE result.  No orders of the defined types were placed in this encounter.   There are no discontinued medications. Return in about 3 months (around 07/10/2013) for Recheck medical problems.  Syrah Daughtrey P. Jacelyn Grip, M.D.

## 2013-04-10 NOTE — Patient Instructions (Signed)
Diabetes and Foot Care Diabetes may cause you to have problems because of poor blood supply (circulation) to your feet and legs. This may cause the skin on your feet to become thinner, break easier, and heal more slowly. Your skin may become dry, and the skin may peel and crack. You may also have nerve damage in your legs and feet causing decreased feeling in them. You may not notice minor injuries to your feet that could lead to infections or more serious problems. Taking care of your feet is one of the most important things you can do for yourself.  HOME CARE INSTRUCTIONS  Wear shoes at all times, even in the house. Do not go barefoot. Bare feet are easily injured.  Check your feet daily for blisters, cuts, and redness. If you cannot see the bottom of your feet, use a mirror or ask someone for help.  Wash your feet with warm water (do not use hot water) and mild soap. Then pat your feet and the areas between your toes until they are completely dry. Do not soak your feet as this can dry your skin.  Apply a moisturizing lotion or petroleum jelly (that does not contain alcohol and is unscented) to the skin on your feet and to dry, brittle toenails. Do not apply lotion between your toes.  Trim your toenails straight across. Do not dig under them or around the cuticle. File the edges of your nails with an emery board or nail file.  Do not cut corns or calluses or try to remove them with medicine.  Wear clean socks or stockings every day. Make sure they are not too tight. Do not wear knee-high stockings since they may decrease blood flow to your legs.  Wear shoes that fit properly and have enough cushioning. To break in new shoes, wear them for just a few hours a day. This prevents you from injuring your feet. Always look in your shoes before you put them on to be sure there are no objects inside.  Do not cross your legs. This may decrease the blood flow to your feet.  If you find a minor scrape,  cut, or break in the skin on your feet, keep it and the skin around it clean and dry. These areas may be cleansed with mild soap and water. Do not cleanse the area with peroxide, alcohol, or iodine.  When you remove an adhesive bandage, be sure not to damage the skin around it.  If you have a wound, look at it several times a day to make sure it is healing.  Do not use heating pads or hot water bottles. They may burn your skin. If you have lost feeling in your feet or legs, you may not know it is happening until it is too late.  Make sure your health care provider performs a complete foot exam at least annually or more often if you have foot problems. Report any cuts, sores, or bruises to your health care provider immediately. SEEK MEDICAL CARE IF:   You have an injury that is not healing.  You have cuts or breaks in the skin.  You have an ingrown nail.  You notice redness on your legs or feet.  You feel burning or tingling in your legs or feet.  You have pain or cramps in your legs and feet.  Your legs or feet are numb.  Your feet always feel cold. SEEK IMMEDIATE MEDICAL CARE IF:   There is increasing redness,   swelling, or pain in or around a wound.  There is a red line that goes up your leg.  Pus is coming from a wound.  You develop a fever or as directed by your health care provider.  You notice a bad smell coming from an ulcer or wound. Document Released: 12/26/1999 Document Revised: 08/30/2012 Document Reviewed: 06/06/2012 [facility] Patient Information 2014 [place].   Obesity Obesity is defined as having too much total body fat and a body mass index (BMI) of 30 or more. BMI is an estimate of body fat and is calculated from your height and weight. Obesity happens when you consume more calories than you can burn by exercising or performing daily physical tasks. Prolonged obesity can cause major illnesses or emergencies, such as:   A stroke.  Heart  disease.  Diabetes.  Cancer.  Arthritis.  High blood pressure (hypertension).  High cholesterol.  Sleep apnea.  Erectile dysfunction.  Infertility problems. CAUSES   Regularly eating unhealthy foods.  Physical inactivity.  Certain disorders, such as an underactive thyroid (hypothyroidism), Cushing's syndrome, and polycystic ovarian syndrome.  Certain medicines, such as steroids, some depression medicines, and antipsychotics.  Genetics.  Lack of sleep. DIAGNOSIS  A caregiver can diagnose obesity after calculating your BMI. Obesity will be diagnosed if your BMI is 30 or higher.  There are other methods of measuring obesity levels. Some other methods include measuring your skin fold thickness, your waist circumference, and comparing your hip circumference to your waist circumference. TREATMENT  A healthy treatment program includes some or all of the following:  Long-term dietary changes.  Exercise and physical activity.  Behavioral and lifestyle changes.  Medicine only under the supervision of your caregiver. Medicines may help, but only if they are used with diet and exercise programs. An unhealthy treatment program includes:  Fasting.  Fad diets.  Supplements and drugs. These choices do not succeed in long-term weight control.  HOME CARE INSTRUCTIONS   Exercise and perform physical activity as directed by your caregiver. To increase physical activity, try the following:  Use stairs instead of elevators.  Park farther away from store entrances.  Garden, bike, or walk instead of watching television or using the computer.  Eat healthy, low-calorie foods and drinks on a regular basis. Eat more fruits and vegetables. Use low-calorie cookbooks or take healthy cooking classes.  Limit fast food, sweets, and processed snack foods.  Eat smaller portions.  Keep a daily journal of everything you eat. There are many free websites to help you with this. It may be  helpful to measure your foods so you can determine if you are eating the correct portion sizes.  Avoid drinking alcohol. Drink more water and drinks without calories.  Take vitamins and supplements only as recommended by your caregiver.  Weight-loss support groups, Nurse, mental health, counselors, and stress reduction education can also be very helpful. SEEK IMMEDIATE MEDICAL CARE IF:  You have chest pain or tightness.  You have trouble breathing or feel short of breath.  You have weakness or leg numbness.  You feel confused or have trouble talking.  You have sudden changes in your vision. MAKE SURE YOU:  Understand these instructions.  Will watch your condition.  Will get help right away if you are not doing well or get worse. Document Released: 02/05/2004 Document Revised: 06/29/2011 Document Reviewed: 02/03/2011 [facility] Patient Information 2014 [place].        Dr Paula Libra Recommendations  For nutrition information, I recommend books:  1).Eat to Live by Dr Excell Seltzer. 2).Prevent and Reverse Heart Disease by Dr Karl Luke. 3) Dr Janene Harvey Book:  Program to Reverse Diabetes  Exercise recommendations are:  If unable to walk, then the patient can exercise in a chair 3 times a day. By flapping arms like a bird gently and raising legs outwards to the front.  If ambulatory, the patient can go for walks for 30 minutes 3 times a week. Then increase the intensity and duration as tolerated.  Goal is to try to attain exercise frequency to 5 times a week.  If applicable: Best to perform resistance exercises (machines or weights) 2 days a week and cardio type exercises 3 days per week.

## 2013-04-11 LAB — VITAMIN B12: Vitamin B-12: 282 pg/mL (ref 211–946)

## 2013-04-11 LAB — CMP14+EGFR
ALT: 32 IU/L (ref 0–44)
AST: 27 IU/L (ref 0–40)
Albumin/Globulin Ratio: 1.4 (ref 1.1–2.5)
Albumin: 4.1 g/dL (ref 3.5–4.8)
Alkaline Phosphatase: 69 IU/L (ref 39–117)
BUN/Creatinine Ratio: 17 (ref 10–22)
BUN: 18 mg/dL (ref 8–27)
CO2: 23 mmol/L (ref 18–29)
Calcium: 9.3 mg/dL (ref 8.6–10.2)
Chloride: 96 mmol/L — ABNORMAL LOW (ref 97–108)
Creatinine, Ser: 1.05 mg/dL (ref 0.76–1.27)
GFR calc Af Amer: 81 mL/min/{1.73_m2} (ref >59)
GFR calc non Af Amer: 70 mL/min/{1.73_m2} (ref >59)
Globulin, Total: 2.9 g/dL (ref 1.5–4.5)
Glucose: 168 mg/dL — ABNORMAL HIGH (ref 65–99)
Potassium: 3.9 mmol/L (ref 3.5–5.2)
Sodium: 138 mmol/L (ref 134–144)
Total Bilirubin: 0.7 mg/dL (ref 0.0–1.2)
Total Protein: 7 g/dL (ref 6.0–8.5)

## 2013-04-11 LAB — NMR, LIPOPROFILE
Cholesterol: 133 mg/dL (ref <200)
HDL Cholesterol by NMR: 34 mg/dL — ABNORMAL LOW (ref >=40)
HDL Particle Number: 26.3 umol/L — ABNORMAL LOW (ref >=30.5)
LDL Particle Number: 1085 nmol/L — ABNORMAL HIGH (ref <1000)
LDL Size: 20.3 nm — ABNORMAL LOW (ref >20.5)
LDLC SERPL CALC-MCNC: 62 mg/dL (ref <100)
LP-IR Score: 77 — ABNORMAL HIGH (ref <=45)
Small LDL Particle Number: 589 nmol/L — ABNORMAL HIGH (ref <=527)
Triglycerides by NMR: 185 mg/dL — ABNORMAL HIGH (ref <150)

## 2013-04-11 LAB — TSH: TSH: 1.49 u[IU]/mL (ref 0.450–4.500)

## 2013-04-11 LAB — FOLATE: Folate: 15.7 ng/mL (ref >3.0)

## 2013-04-11 LAB — URIC ACID: Uric Acid: 6.4 mg/dL (ref 3.7–8.6)

## 2013-04-11 LAB — RPR: RPR: NONREACTIVE

## 2013-04-24 ENCOUNTER — Other Ambulatory Visit: Payer: Self-pay | Admitting: Family Medicine

## 2013-06-11 ENCOUNTER — Other Ambulatory Visit: Payer: Self-pay

## 2013-06-11 MED ORDER — AMLODIPINE BESYLATE 5 MG PO TABS: ORAL_TABLET | ORAL | Status: DC

## 2013-07-19 ENCOUNTER — Ambulatory Visit: Payer: Medicare Other | Admitting: Family

## 2013-07-27 ENCOUNTER — Encounter: Payer: Self-pay | Admitting: Family Medicine

## 2013-07-27 ENCOUNTER — Ambulatory Visit (INDEPENDENT_AMBULATORY_CARE_PROVIDER_SITE_OTHER): Payer: Medicare Other | Admitting: Family Medicine

## 2013-07-27 VITALS — BP 121/71 | HR 66 | Temp 98.1°F | Ht 66.0 in | Wt 218.6 lb

## 2013-07-27 DIAGNOSIS — E119 Type 2 diabetes mellitus without complications: Secondary | ICD-10-CM

## 2013-07-27 DIAGNOSIS — E785 Hyperlipidemia, unspecified: Secondary | ICD-10-CM

## 2013-07-27 DIAGNOSIS — I10 Essential (primary) hypertension: Secondary | ICD-10-CM

## 2013-07-27 LAB — POCT GLYCOSYLATED HEMOGLOBIN (HGB A1C): Hemoglobin A1C: 8

## 2013-07-27 NOTE — Patient Instructions (Signed)
Diabetes and Foot Care Diabetes may cause you to have problems because of poor blood supply (circulation) to your feet and legs. This may cause the skin on your feet to become thinner, break easier, and heal more slowly. Your skin may become dry, and the skin may peel and crack. You may also have nerve damage in your legs and feet causing decreased feeling in them. You may not notice minor injuries to your feet that could lead to infections or more serious problems. Taking care of your feet is one of the most important things you can do for yourself.  HOME CARE INSTRUCTIONS  Wear shoes at all times, even in the house. Do not go barefoot. Bare feet are easily injured.  Check your feet daily for blisters, cuts, and redness. If you cannot see the bottom of your feet, use a mirror or ask someone for help.  Wash your feet with warm water (do not use hot water) and mild soap. Then pat your feet and the areas between your toes until they are completely dry. Do not soak your feet as this can dry your skin.  Apply a moisturizing lotion or petroleum jelly (that does not contain alcohol and is unscented) to the skin on your feet and to dry, brittle toenails. Do not apply lotion between your toes.  Trim your toenails straight across. Do not dig under them or around the cuticle. File the edges of your nails with an emery board or nail file.  Do not cut corns or calluses or try to remove them with medicine.  Wear clean socks or stockings every day. Make sure they are not too tight. Do not wear knee-high stockings since they may decrease blood flow to your legs.  Wear shoes that fit properly and have enough cushioning. To break in new shoes, wear them for just a few hours a day. This prevents you from injuring your feet. Always look in your shoes before you put them on to be sure there are no objects inside.  Do not cross your legs. This may decrease the blood flow to your feet.  If you find a minor scrape,  cut, or break in the skin on your feet, keep it and the skin around it clean and dry. These areas may be cleansed with mild soap and water. Do not cleanse the area with peroxide, alcohol, or iodine.  When you remove an adhesive bandage, be sure not to damage the skin around it.  If you have a wound, look at it several times a day to make sure it is healing.  Do not use heating pads or hot water bottles. They may burn your skin. If you have lost feeling in your feet or legs, you may not know it is happening until it is too late.  Make sure your health care provider performs a complete foot exam at least annually or more often if you have foot problems. Report any cuts, sores, or bruises to your health care provider immediately. SEEK MEDICAL CARE IF:   You have an injury that is not healing.  You have cuts or breaks in the skin.  You have an ingrown nail.  You notice redness on your legs or feet.  You feel burning or tingling in your legs or feet.  You have pain or cramps in your legs and feet.  Your legs or feet are numb.  Your feet always feel cold. SEEK IMMEDIATE MEDICAL CARE IF:   There is increasing redness,   swelling, or pain in or around a wound.  There is a red line that goes up your leg.  Pus is coming from a wound.  You develop a fever or as directed by your health care provider.  You notice a bad smell coming from an ulcer or wound. Document Released: 12/26/1999 Document Revised: 08/30/2012 Document Reviewed: 06/06/2012 Exit[employer] Patient Information 2015 [employer]. This information is not intended to replace advice given to you by your health care provider. Make sure you discuss any questions you have with your health care provider.  

## 2013-07-27 NOTE — Progress Notes (Signed)
   Subjective:    Patient ID: [name], male    DOB: January 01, 1941, 73 y.o.   MRN: [id number]  HPI    Review of Systems  Constitutional: Negative.   HENT: Negative.   Eyes: Negative.   Respiratory: Negative.   Cardiovascular: Negative.   Gastrointestinal: Negative.   Endocrine: Negative.   Genitourinary: Negative.   Musculoskeletal: Negative.   Skin: Negative.   Neurological: Negative.        Objective:   Physical Exam  Nursing note and vitals reviewed. Constitutional: He appears well-developed and well-nourished.  HENT:  Head: Normocephalic.  Eyes: Pupils are equal, round, and reactive to light.  Neck: Normal range of motion.  Cardiovascular: Normal rate and regular rhythm.   Pulmonary/Chest: Effort normal.  Abdominal: Soft.  Musculoskeletal: Normal range of motion.  Neurological: He is alert.  Skin: Skin is warm.  Psychiatric: He has a normal mood and affect. His behavior is normal. Judgment and thought content normal.          Assessment & Plan:  The primary encounter diagnosis was Essential hypertension, benign. A diagnosis of HYPERLIPIDEMIA-MIXED was also pertinent to this visit.  Routine labs ordered  Wardell Honour MD

## 2013-07-28 LAB — BMP8+EGFR
BUN/Creatinine Ratio: 22 (ref 10–22)
BUN: 22 mg/dL (ref 8–27)
CHLORIDE: 94 mmol/L — AB (ref 97–108)
CO2: 24 mmol/L (ref 18–29)
Calcium: 9.4 mg/dL (ref 8.6–10.2)
Creatinine, Ser: 1 mg/dL (ref 0.76–1.27)
GFR calc non Af Amer: 74 mL/min/{1.73_m2} (ref >59)
GFR, EST AFRICAN AMERICAN: 86 mL/min/{1.73_m2} (ref >59)
Glucose: 170 mg/dL — ABNORMAL HIGH (ref 65–99)
Potassium: 3.9 mmol/L (ref 3.5–5.2)
Sodium: 136 mmol/L (ref 134–144)

## 2013-07-30 ENCOUNTER — Encounter: Payer: Self-pay | Admitting: Family Medicine

## 2013-07-30 ENCOUNTER — Telehealth: Payer: Self-pay | Admitting: Family Medicine

## 2013-07-30 DIAGNOSIS — E669 Obesity, unspecified: Secondary | ICD-10-CM | POA: Insufficient documentation

## 2013-07-30 MED ORDER — PAROXETINE HCL 20 MG PO TABS: 20.0000 mg | ORAL_TABLET | Freq: Every day | ORAL | Status: DC

## 2013-07-30 MED ORDER — INSULIN ASPART 100 UNIT/ML ~~LOC~~ SOLN: 0.0000 [IU] | Freq: Three times a day (TID) | SUBCUTANEOUS | Status: DC

## 2013-07-30 MED ORDER — PRAZOSIN HCL 1 MG PO CAPS: 1.0000 mg | ORAL_CAPSULE | Freq: Two times a day (BID) | ORAL | Status: DC

## 2013-07-30 MED ORDER — AMLODIPINE BESYLATE 5 MG PO TABS: ORAL_TABLET | ORAL | Status: DC

## 2013-07-30 MED ORDER — ATORVASTATIN CALCIUM 20 MG PO TABS: 20.0000 mg | ORAL_TABLET | Freq: Every day | ORAL | Status: DC

## 2013-07-30 MED ORDER — METFORMIN HCL ER 500 MG PO TB24
1000.0000 mg | ORAL_TABLET | Freq: Two times a day (BID) | ORAL | Status: DC
Start: ? — End: 2014-05-01

## 2013-07-30 MED ORDER — FAMOTIDINE 20 MG PO TABS
20.0000 mg | ORAL_TABLET | Freq: Two times a day (BID) | ORAL | Status: DC
Start: ? — End: 2014-04-22

## 2013-07-30 NOTE — Telephone Encounter (Signed)
Message copied by Waverly Ferrari on Mon Jul 30, 2013  9:14 AM ------      Message from: Wardell Honour      Created: Mon Jul 30, 2013  7:58 AM       Expected result of elevated hemoglobin A1c. Patient has not been taking insulin do to expense. We are in the process of trying to switch over to 7030 or at least to NovoLog rather than Humalog. This should help with compliance. ------

## 2013-09-28 ENCOUNTER — Other Ambulatory Visit: Payer: Self-pay | Admitting: *Deleted

## 2013-10-01 ENCOUNTER — Ambulatory Visit (INDEPENDENT_AMBULATORY_CARE_PROVIDER_SITE_OTHER): Payer: Medicare Other | Admitting: Cardiology

## 2013-10-01 ENCOUNTER — Encounter: Payer: Self-pay | Admitting: Cardiology

## 2013-10-01 VITALS — BP 130/71 | HR 66 | Ht 66.0 in | Wt 221.0 lb

## 2013-10-01 DIAGNOSIS — I2510 Atherosclerotic heart disease of native coronary artery without angina pectoris: Secondary | ICD-10-CM

## 2013-10-01 DIAGNOSIS — I10 Essential (primary) hypertension: Secondary | ICD-10-CM

## 2013-10-01 DIAGNOSIS — E785 Hyperlipidemia, unspecified: Secondary | ICD-10-CM

## 2013-10-01 DIAGNOSIS — E1159 Type 2 diabetes mellitus with other circulatory complications: Secondary | ICD-10-CM

## 2013-10-01 NOTE — Assessment & Plan Note (Signed)
Continues on Lipitor, LDL 62 in March. Keep followup with Dr. Sabra Heck.

## 2013-10-01 NOTE — Patient Instructions (Signed)

## 2013-10-01 NOTE — Assessment & Plan Note (Signed)
No active angina symptoms with history of BMS to nondominant RCA in 2006. ECG is normal. Continue medical therapy and observation.

## 2013-10-01 NOTE — Assessment & Plan Note (Signed)
Blood pressure reasonable today, no change in antihypertensive regimen.

## 2013-10-01 NOTE — Assessment & Plan Note (Signed)
Keep followup with Dr. Sabra Heck, recent hemoglobin A1c 8.

## 2013-10-01 NOTE — Progress Notes (Signed)
Clinical Summary [name] is a 73 y.o.male last seen in March. He has been doing well overall, reports one episode of chest discomfort since I last saw him, not entirely clear whether it was indigestion or angina. He has not required nitroglycerin. No hospital stays.  ECG today shows normal sinus rhythm.  Cardiac catheterization May 2013 demonstrated nonobstructive disease with patent stent site in the RCA which was a small and severely diseased, nondominant vessel. Medical therapy was most appropriate.  Lipid panel from March showed total cholesterol 133, triglycerides 185, HDL 34, and LDL 62.   Allergies  Allergen Reactions  . Ace Inhibitors   . Angiotensin Receptor Blockers   . Ciprofloxacin     confusion  . Doxycycline   . Oxycodone-Acetaminophen     REACTION: unknown reaction  . Ramipril   . Toradol [Ketorolac Tromethamine]     confusion  . Valium     Hallucinations; confusion    Current Outpatient Prescriptions  Medication Sig Dispense Refill  . acetaminophen (TYLENOL) 500 MG tablet Take 500 mg by mouth every 6 (six) hours as needed. Takes 2 every morning      . amLODipine (NORVASC) 5 MG tablet TAKE 1 TABLET DAILY  90 tablet  0  . aspirin 81 MG tablet Take 81 mg by mouth daily.      Marland Kitchen atorvastatin (LIPITOR) 20 MG tablet Take 1 tablet (20 mg total) by mouth daily at 6 PM.  90 tablet  1  . B-D ULTRAFINE III SHORT PEN 31G X 8 MM MISC USE 4 TIMES DAILY  100 each  2  . butalbital-acetaminophen-caffeine (FIORICET WITH CODEINE) 50-325-40-30 MG per capsule Take 1 capsule by mouth every 4 (four) hours as needed.        . cetirizine (ZYRTEC) 10 MG tablet Take 10 mg by mouth daily.        . chlorthalidone (HYGROTON) 25 MG tablet Take 1 tablet (25 mg total) by mouth daily. As directed.  90 tablet  3  . Cholecalciferol (VITAMIN D3) 2000 UNITS TABS Take 1 tablet by mouth daily.        . famotidine (PEPCID) 20 MG tablet Take 1 tablet (20 mg total) by mouth 2 (two) times daily.   180 tablet  1  . insulin aspart (NOVOLOG) 100 UNIT/ML injection Inject 0-15 Units into the skin 3 (three) times daily before meals. 70-150=8units 151-200=9units  15 vial  3  . insulin glargine (LANTUS) 100 UNIT/ML injection Inject 0.52 mLs (52 Units total) into the skin at bedtime.  10 mL  1  . Insulin Pen Needle 31G X 8 MM MISC 4 Devices by Does not apply route 4 (four) times daily.  400 each  1  . LANTUS SOLOSTAR 100 UNIT/ML Solostar Pen INJECT 52 UNITS INTO THE SKIN AT BEDTIME  45 pen  3  . metFORMIN (GLUCOPHAGE-XR) 500 MG 24 hr tablet Take 2 tablets (1,000 mg total) by mouth 2 (two) times daily.  360 tablet  1  . NITROSTAT 0.4 MG SL tablet USE AS DIRECTED AS NEEDED FOR CHEST PAIN  25 each  1  . PARoxetine (PAXIL) 20 MG tablet Take 1 tablet (20 mg total) by mouth daily.  90 tablet  1  . prazosin (MINIPRESS) 1 MG capsule Take 1 capsule (1 mg total) by mouth 2 (two) times daily.  180 capsule  1   No current facility-administered medications for this visit.    Past Medical History  Diagnosis Date  . Type  2 diabetes mellitus   . Anxiety disorder   . Arthritis   . Essential hypertension, benign   . Coronary atherosclerosis of native coronary artery     BMS nondominant RCA 12/2004  . Acute encephalopathy 08/2010    Final diagnosis was not consistent with West Nile virus.  . Adenomatous polyp   . Gallbladder cholesterolosis   . Diverticulosis   . Internal hemorrhoids   . Nephrolithiasis   . Chronic headaches   . Myocardial infarction     2006  . GERD (gastroesophageal reflux disease)   . Hyperlipidemia     Social History Mr. Pullin reports that he quit smoking about 25 years ago. His smoking use included Cigarettes. He has a 25 pack-year smoking history. He quit smokeless tobacco use about 24 years ago. His smokeless tobacco use included Chew. Mr. Friberg reports that he drinks alcohol.  Review of Systems Hard of hearing. No palpitations or syncope. No orthopnea or PND. States  that he "eats too much." Other systems reviewed and negative.  Physical Examination Filed Vitals:   10/01/13 0827  BP: 130/71  Pulse: 66   Filed Weights   10/01/13 0827  Weight: 221 lb (100.245 kg)    Obese male, appears comfortable at rest.  HEENT: Conjunctiva and lids normal, oropharynx clear.  Neck: Supple, no elevated JVP or carotid bruits, no thyromegaly.  Lungs: Clear to auscultation, nonlabored breathing at rest.  Cardiac: Regular rate and rhythm, no S3 or significant systolic murmur, no pericardial rub.  Abdomen: Soft, nontender, bowel sounds present, no guarding or rebound.  Extremities: No pitting edema, distal pulses 2+.  Skin: Warm and dry.  Musculoskeletal: No kyphosis.  Neuropsychiatric: Alert and oriented x3, affect grossly appropriate.   Problem List and Plan   CORONARY ATHEROSCLEROSIS NATIVE CORONARY ARTERY No active angina symptoms with history of BMS to nondominant RCA in 2006. ECG is normal. Continue medical therapy and observation.  HYPERLIPIDEMIA-MIXED Continues on Lipitor, LDL 62 in March. Keep followup with Dr. Sabra Heck.  Type 2 diabetes mellitus with circulatory disorder Keep followup with Dr. Sabra Heck, recent hemoglobin A1c 8.  Essential hypertension, benign Blood pressure reasonable today, no change in antihypertensive regimen.    Satira Sark, M.D., F.A.C.C.

## 2013-10-09 ENCOUNTER — Other Ambulatory Visit: Payer: Self-pay | Admitting: *Deleted

## 2013-10-09 MED ORDER — INSULIN ASPART 100 UNIT/ML ~~LOC~~ SOLN: 0.0000 [IU] | Freq: Three times a day (TID) | SUBCUTANEOUS | Status: DC

## 2013-10-18 ENCOUNTER — Telehealth: Payer: Self-pay | Admitting: Family Medicine

## 2013-10-22 ENCOUNTER — Other Ambulatory Visit: Payer: Self-pay | Admitting: *Deleted

## 2013-10-22 MED ORDER — ATORVASTATIN CALCIUM 20 MG PO TABS: 20.0000 mg | ORAL_TABLET | Freq: Every day | ORAL | Status: DC

## 2013-10-22 MED ORDER — PRAZOSIN HCL 1 MG PO CAPS: 1.0000 mg | ORAL_CAPSULE | Freq: Two times a day (BID) | ORAL | Status: DC

## 2013-10-22 MED ORDER — INSULIN ASPART 100 UNIT/ML ~~LOC~~ SOLN: 0.0000 [IU] | Freq: Three times a day (TID) | SUBCUTANEOUS | Status: DC

## 2013-10-22 NOTE — Telephone Encounter (Signed)
Called [facility] - rx that they received was for #15 vials and they thought this was too much so filled for only #37ml.  Patient needed 25mL for 90 days.  New Rx sent to [facility] with correct amount.

## 2013-10-22 NOTE — Telephone Encounter (Signed)
It says vials?

## 2013-11-08 ENCOUNTER — Ambulatory Visit (INDEPENDENT_AMBULATORY_CARE_PROVIDER_SITE_OTHER): Payer: Medicare Other | Admitting: Family Medicine

## 2013-11-08 ENCOUNTER — Encounter: Payer: Self-pay | Admitting: Family Medicine

## 2013-11-08 ENCOUNTER — Other Ambulatory Visit: Payer: Self-pay | Admitting: *Deleted

## 2013-11-08 ENCOUNTER — Telehealth: Payer: Self-pay | Admitting: *Deleted

## 2013-11-08 VITALS — BP 125/67 | HR 65 | Temp 97.2°F | Ht 66.0 in | Wt 220.4 lb

## 2013-11-08 DIAGNOSIS — Z23 Encounter for immunization: Secondary | ICD-10-CM

## 2013-11-08 DIAGNOSIS — E785 Hyperlipidemia, unspecified: Secondary | ICD-10-CM

## 2013-11-08 DIAGNOSIS — E1169 Type 2 diabetes mellitus with other specified complication: Secondary | ICD-10-CM

## 2013-11-08 DIAGNOSIS — E1159 Type 2 diabetes mellitus with other circulatory complications: Secondary | ICD-10-CM

## 2013-11-08 DIAGNOSIS — I10 Essential (primary) hypertension: Secondary | ICD-10-CM

## 2013-11-08 LAB — POCT GLYCOSYLATED HEMOGLOBIN (HGB A1C): Hemoglobin A1C: 7.8

## 2013-11-08 LAB — POCT UA - MICROALBUMIN: Microalbumin Ur, POC: 20 mg/L

## 2013-11-08 MED ORDER — INSULIN LISPRO 100 UNIT/ML (KWIKPEN): PEN_INJECTOR | SUBCUTANEOUS | Status: DC

## 2013-11-08 MED ORDER — SILDENAFIL CITRATE 20 MG PO TABS: ORAL_TABLET | ORAL | Status: DC

## 2013-11-08 NOTE — Telephone Encounter (Signed)
Pt notified of results Verbalizes understanding 

## 2013-11-08 NOTE — Telephone Encounter (Signed)
Message copied by Marin Olp on Thu Nov 08, 2013 12:08 PM ------      Message from: Wardell Honour      Created: Thu Nov 08, 2013 11:33 AM       Hemoglobin A1c has improved since last tested and urine microalbumin is unchanged from one year ago. No further recommendation. ------

## 2013-11-08 NOTE — Progress Notes (Signed)
   Subjective:    Patient ID: [name], male    DOB: 16-Sep-1940, 73 y.o.   MRN: [id number]  HPI 73 year old gentleman here to follow-up diabetes and hypertension. He saw his cardiologist last month and was given a good report. He has been monitoring his sugars at home and generally they have been less than 200. He is on long and short acting insulin as well as metformin.    Review of Systems  Constitutional: Negative.   HENT: Negative.   Eyes: Negative.   Respiratory: Negative.  Negative for shortness of breath.   Cardiovascular: Negative.  Negative for chest pain and leg swelling.  Gastrointestinal: Negative.   Genitourinary: Negative.   Musculoskeletal: Negative.   Skin: Negative.   Neurological: Negative.   Psychiatric/Behavioral: Negative.   All other systems reviewed and are negative.      Objective:   Physical Exam  Constitutional: He is oriented to person, place, and time. He appears well-developed and well-nourished.  HENT:  Head: Normocephalic.  Right Ear: External ear normal.  Left Ear: External ear normal.  Nose: Nose normal.  Mouth/Throat: Oropharynx is clear and moist.  Eyes: Conjunctivae and EOM are normal. Pupils are equal, round, and reactive to light.  Neck: Normal range of motion. Neck supple.  Cardiovascular: Normal rate, regular rhythm, normal heart sounds and intact distal pulses.   Pulmonary/Chest: Effort normal and breath sounds normal.  Abdominal: Soft. Bowel sounds are normal.  Musculoskeletal: Normal range of motion.  Neurological: He is alert and oriented to person, place, and time.  Skin: Skin is warm and dry.  Psychiatric: He has a normal mood and affect. His behavior is normal. Judgment and thought content normal.   BP 125/67  Pulse 65  Temp(Src) 97.2 F (36.2 C) (Oral)  Ht 5\' 6"  (1.676 m)  Wt 220 lb 6.4 oz (99.973 kg)  BMI 35.59 kg/m2       Assessment & Plan:  1. Essential hypertension, benign Not on ACE or ARB  ?  allergy  2. Type 2 diabetes mellitus with other circulatory complications  - POCT glycosylated hemoglobin (Hb A1C) - POCT UA - Microalbumin  Rx" Sildenafil 20 mg  3. Hyperlipidemia associated with type 2 diabetes mellitus

## 2013-11-08 NOTE — Patient Instructions (Signed)
Diabetes and Exercise Exercising regularly is important. It is not just about losing weight. It has many health benefits, such as:  Improving your overall fitness, flexibility, and endurance.  Increasing your bone density.  Helping with weight control.  Decreasing your body fat.  Increasing your muscle strength.  Reducing stress and tension.  Improving your overall health. People with diabetes who exercise gain additional benefits because exercise:  Reduces appetite.  Improves the body's use of blood sugar (glucose).  Helps lower or control blood glucose.  Decreases blood pressure.  Helps control blood lipids (such as cholesterol and triglycerides).  Improves the body's use of the hormone insulin by:  Increasing the body's insulin sensitivity.  Reducing the body's insulin needs.  Decreases the risk for heart disease because exercising:  Lowers cholesterol and triglycerides levels.  Increases the levels of good cholesterol (such as high-density lipoproteins [HDL]) in the body.  Lowers blood glucose levels. YOUR ACTIVITY PLAN  Choose an activity that you enjoy and set realistic goals. Your health care provider or diabetes educator can help you make an activity plan that works for you. Exercise regularly as directed by your health care provider. This includes:  Performing resistance training twice a week such as push-ups, sit-ups, lifting weights, or using resistance bands.  Performing 150 minutes of cardio exercises each week such as walking, running, or playing sports.  Staying active and spending no more than 90 minutes at one time being inactive. Even short bursts of exercise are good for you. Three 10-minute sessions spread throughout the day are just as beneficial as a single 30-minute session. Some exercise ideas include:  Taking the dog for a walk.  Taking the stairs instead of the elevator.  Dancing to your favorite song.  Doing an exercise  video.  Doing your favorite exercise with a friend. RECOMMENDATIONS FOR EXERCISING WITH TYPE 1 OR TYPE 2 DIABETES   Check your blood glucose before exercising. If blood glucose levels are greater than 240 mg/dL, check for urine ketones. Do not exercise if ketones are present.  Avoid injecting insulin into areas of the body that are going to be exercised. For example, avoid injecting insulin into:  The arms when playing tennis.  The legs when jogging.  Keep a record of:  Food intake before and after you exercise.  Expected peak times of insulin action.  Blood glucose levels before and after you exercise.  The type and amount of exercise you have done.  Review your records with your health care provider. Your health care provider will help you to develop guidelines for adjusting food intake and insulin amounts before and after exercising.  If you take insulin or oral hypoglycemic agents, watch for signs and symptoms of hypoglycemia. They include:  Dizziness.  Shaking.  Sweating.  Chills.  Confusion.  Drink plenty of water while you exercise to prevent dehydration or heat stroke. Body water is lost during exercise and must be replaced.  Talk to your health care provider before starting an exercise program to make sure it is safe for you. Remember, almost any type of activity is better than none. Document Released: 03/20/2003 Document Revised: 05/14/2013 Document Reviewed: 06/06/2012 [employer] Patient Information 2015 [employer]. This information is not intended to replace advice given to you by your health care provider. Make sure you discuss any questions you have with your health care provider.  

## 2013-11-08 NOTE — Addendum Note (Signed)
Addended by: Wyline Mood on: 11/08/2013 09:38 AM   Modules accepted: Orders

## 2013-11-09 ENCOUNTER — Telehealth: Payer: Self-pay | Admitting: Family Medicine

## 2013-11-09 LAB — MICROALBUMIN, URINE: Microalbumin, Urine: 53.3 ug/mL — ABNORMAL HIGH (ref 0.0–17.0)

## 2013-11-09 NOTE — Telephone Encounter (Signed)
Wife is going to call insurance company and see if they will cover strips of the machine he was giving yesterday and call us and let us know.

## 2013-11-13 ENCOUNTER — Encounter: Payer: Self-pay | Admitting: Internal Medicine

## 2013-11-15 ENCOUNTER — Encounter: Payer: Self-pay | Admitting: Family Medicine

## 2013-11-15 ENCOUNTER — Other Ambulatory Visit: Payer: Self-pay | Admitting: *Deleted

## 2013-11-15 MED ORDER — ONETOUCH ULTRA SYSTEM W/DEVICE KIT: 1.0000 | PACK | Freq: Once | Status: DC

## 2013-11-23 ENCOUNTER — Telehealth: Payer: Self-pay | Admitting: Family Medicine

## 2013-11-23 NOTE — Telephone Encounter (Signed)
Patient wants to wait a month and get this rechecked before we do anything is that okay with you?

## 2013-11-23 NOTE — Telephone Encounter (Signed)
For the microalbumin, we usually treat with an ace inhibitor or ARB; he is allergic to both think we should have him see the nephrologist to see if there are other treatments available. Otherwise to control diabetes as best we can is always good

## 2013-11-23 NOTE — Telephone Encounter (Signed)
Advise per labs

## 2013-11-30 ENCOUNTER — Encounter: Payer: Self-pay | Admitting: Family Medicine

## 2013-12-12 ENCOUNTER — Other Ambulatory Visit: Payer: Self-pay | Admitting: Family Medicine

## 2013-12-13 ENCOUNTER — Other Ambulatory Visit: Payer: Self-pay | Admitting: *Deleted

## 2013-12-13 MED ORDER — AMLODIPINE BESYLATE 5 MG PO TABS: ORAL_TABLET | ORAL | Status: DC

## 2013-12-14 ENCOUNTER — Other Ambulatory Visit (INDEPENDENT_AMBULATORY_CARE_PROVIDER_SITE_OTHER): Payer: Medicare Other

## 2013-12-14 DIAGNOSIS — E1159 Type 2 diabetes mellitus with other circulatory complications: Secondary | ICD-10-CM

## 2013-12-14 DIAGNOSIS — E1151 Type 2 diabetes mellitus with diabetic peripheral angiopathy without gangrene: Secondary | ICD-10-CM

## 2013-12-14 LAB — POCT UA - MICROALBUMIN: Microalbumin Ur, POC: 50 mg/L

## 2013-12-14 NOTE — Progress Notes (Signed)
Lab only 

## 2013-12-15 LAB — MICROALBUMIN, URINE: Microalbumin, Urine: 23.8 ug/mL — ABNORMAL HIGH (ref 0.0–17.0)

## 2013-12-20 ENCOUNTER — Encounter (HOSPITAL_COMMUNITY): Payer: Self-pay | Admitting: Cardiovascular Disease

## 2014-01-01 ENCOUNTER — Telehealth: Payer: Self-pay | Admitting: Family Medicine

## 2014-01-01 NOTE — Telephone Encounter (Signed)
I advised patient wife to talk with the billing department where we do not do any 3rd party billing.

## 2014-01-24 ENCOUNTER — Encounter: Payer: Self-pay | Admitting: *Deleted

## 2014-01-30 ENCOUNTER — Other Ambulatory Visit: Payer: Self-pay | Admitting: Cardiology

## 2014-02-19 ENCOUNTER — Encounter: Payer: Self-pay | Admitting: Family Medicine

## 2014-02-19 ENCOUNTER — Ambulatory Visit (INDEPENDENT_AMBULATORY_CARE_PROVIDER_SITE_OTHER): Payer: Medicare Other | Admitting: Family Medicine

## 2014-02-19 VITALS — BP 126/66 | HR 66 | Temp 97.3°F | Ht 66.0 in | Wt 220.0 lb

## 2014-02-19 DIAGNOSIS — E1169 Type 2 diabetes mellitus with other specified complication: Secondary | ICD-10-CM

## 2014-02-19 DIAGNOSIS — K219 Gastro-esophageal reflux disease without esophagitis: Secondary | ICD-10-CM

## 2014-02-19 DIAGNOSIS — I10 Essential (primary) hypertension: Secondary | ICD-10-CM

## 2014-02-19 DIAGNOSIS — E785 Hyperlipidemia, unspecified: Secondary | ICD-10-CM

## 2014-02-19 DIAGNOSIS — Z77090 Contact with and (suspected) exposure to asbestos: Secondary | ICD-10-CM

## 2014-02-19 DIAGNOSIS — E1159 Type 2 diabetes mellitus with other circulatory complications: Secondary | ICD-10-CM

## 2014-02-19 LAB — POCT GLYCOSYLATED HEMOGLOBIN (HGB A1C): Hemoglobin A1C: 7.7

## 2014-02-19 MED ORDER — GABAPENTIN 300 MG PO CAPS: 300.0000 mg | ORAL_CAPSULE | Freq: Three times a day (TID) | ORAL | Status: DC

## 2014-02-19 NOTE — Progress Notes (Signed)
Subjective:    Patient ID: [name], male    DOB: 1940-05-15, 74 y.o.   MRN: [id number]  HPI 74 year old gentleman here to follow-up diabetes. At his last visit we found microalbumin he had subsequent tests for that and is spilling relatively small amount. He is sensitive to ACEs and are drugs and cannot take that so we need to manage his sugars daily well-hydrated, take no NSAIDs, and control blood pressure and explained this to him. He does complain today of some burning in his left foot which is probably the beginning of neuropathy.    He also says that because of exposure to asbestos he is supposed to get chest x-ray and pulmonary function tests and will try to do that today while he is here  Patient Active Problem List   Diagnosis Date Noted  . Obesity (BMI 30-39.9) 07/30/2013  . Type 2 diabetes mellitus with circulatory disorder 05/27/2011  . Hyperlipidemia associated with type 2 diabetes mellitus 10/19/2008  . Essential hypertension, benign 10/19/2008  . CORONARY ATHEROSCLEROSIS NATIVE CORONARY ARTERY 10/17/2008  . GERD (gastroesophageal reflux disease) 12/27/2007   Outpatient Encounter Prescriptions as of 02/19/2014  Medication Sig  . acetaminophen (TYLENOL) 500 MG tablet Take 500 mg by mouth every 6 (six) hours as needed. Takes 2 every morning  . AIMSCO INSULIN SYR ULTRA THIN 31G X 5/16" 0.3 ML MISC   . amLODipine (NORVASC) 5 MG tablet TAKE 1 TABLET DAILY  . aspirin 81 MG tablet Take 81 mg by mouth daily.  Marland Kitchen atorvastatin (LIPITOR) 20 MG tablet Take 1 tablet (20 mg total) by mouth daily at 6 PM.  . B-D ULTRAFINE III SHORT PEN 31G X 8 MM MISC USE 4 TIMES DAILY  . Blood Glucose Monitoring Suppl (ONE TOUCH ULTRA SYSTEM KIT) W/DEVICE KIT 1 kit by Does not apply route once.  . butalbital-acetaminophen-caffeine (FIORICET WITH CODEINE) 50-325-40-30 MG per capsule Take 1 capsule by mouth every 4 (four) hours as needed.    . cetirizine (ZYRTEC) 10 MG tablet Take 10 mg by mouth  daily.    . chlorthalidone (HYGROTON) 25 MG tablet TAKE 1 TABLET BY MOUTH DAILY AS DIRECTED  . Cholecalciferol (VITAMIN D3) 2000 UNITS TABS Take 1 tablet by mouth daily.    . famotidine (PEPCID) 20 MG tablet Take 1 tablet (20 mg total) by mouth 2 (two) times daily.  . insulin aspart (NOVOLOG) 100 UNIT/ML injection Inject 0-15 Units into the skin 3 (three) times daily before meals. 70-150=8units 151-200=9units  . insulin glargine (LANTUS) 100 UNIT/ML injection Inject 0.52 mLs (52 Units total) into the skin at bedtime.  . insulin lispro (HUMALOG) 100 UNIT/ML KiwkPen Inject 0-15 units into skin three times daily before meals. 70-150=8 units 151-200= 9 units  . Insulin Pen Needle 31G X 8 MM MISC 4 Devices by Does not apply route 4 (four) times daily.  Marland Kitchen LANTUS SOLOSTAR 100 UNIT/ML Solostar Pen INJECT 52 UNITS INTO THE SKIN AT BEDTIME  . metFORMIN (GLUCOPHAGE-XR) 500 MG 24 hr tablet Take 2 tablets (1,000 mg total) by mouth 2 (two) times daily.  Marland Kitchen NITROSTAT 0.4 MG SL tablet USE AS DIRECTED AS NEEDED FOR CHEST PAIN  . ONE TOUCH ULTRA TEST test strip   . PARoxetine (PAXIL) 20 MG tablet Take 1 tablet (20 mg total) by mouth daily.  . prazosin (MINIPRESS) 1 MG capsule Take 1 capsule (1 mg total) by mouth 2 (two) times daily.  . sildenafil (REVATIO) 20 MG tablet 1 tab prn1  Review of Systems  Constitutional: Negative.   HENT: Negative.   Respiratory: Negative.   Cardiovascular: Negative.   Gastrointestinal: Negative.   Genitourinary: Negative.   Musculoskeletal: Negative.   Neurological: Negative.   Psychiatric/Behavioral: Negative.        Objective:   Physical Exam  Constitutional: He is oriented to person, place, and time. He appears well-developed and well-nourished.  HENT:  Head: Normocephalic.  Eyes: Pupils are equal, round, and reactive to light.  Cardiovascular: Normal rate and regular rhythm.   Pulmonary/Chest: Effort normal.  Abdominal: Soft.  Musculoskeletal: Normal range of  motion.  Neurological: He is alert and oriented to person, place, and time.  Skin: Skin is warm.  Psychiatric: He has a normal mood and affect.     BP 126/66 mmHg  Pulse 66  Temp(Src) 97.3 F (36.3 C) (Oral)  Ht _0  (1.676 m)  Wt 220 lb (99.791 kg)  BMI 35.53 kg/m2      Assessment & Plan:  1. Type 2 diabetes mellitus with other circulatory complications  seems to be having symptoms of early diabetic neuropathy. Will add Neurontin 300 mg at night and increase to 300 and a.m. depending on symptoms  - POCT glycosylated hemoglobin (Hb A1C) - CMP14+EGFR  2. Hyperlipidemia associated with type 2 diabetes mellitus  - Lipid panel  3. Essential hypertension, benign   4. Gastroesophageal reflux disease, esophagitis presence not specified   5. Exposure to asbestos  - DG Chest 2 View; Future  [name] Honour MD  .[name]

## 2014-02-19 NOTE — Patient Instructions (Signed)
Schedule eye exam

## 2014-02-20 LAB — LIPID PANEL
CHOLESTEROL TOTAL: 133 mg/dL (ref 100–199)
Chol/HDL Ratio: 3.5 ratio units (ref 0.0–5.0)
HDL: 38 mg/dL — AB (ref >39)
LDL Calculated: 66 mg/dL (ref 0–99)
TRIGLYCERIDES: 145 mg/dL (ref 0–149)
VLDL Cholesterol Cal: 29 mg/dL (ref 5–40)

## 2014-02-20 LAB — CMP14+EGFR
A/G RATIO: 1.6 (ref 1.1–2.5)
ALT: 33 IU/L (ref 0–44)
AST: 25 IU/L (ref 0–40)
Albumin: 4.3 g/dL (ref 3.5–4.8)
Alkaline Phosphatase: 63 IU/L (ref 39–117)
BILIRUBIN TOTAL: 0.6 mg/dL (ref 0.0–1.2)
BUN/Creatinine Ratio: 17 (ref 10–22)
BUN: 17 mg/dL (ref 8–27)
CHLORIDE: 100 mmol/L (ref 97–108)
CO2: 25 mmol/L (ref 18–29)
Calcium: 9.8 mg/dL (ref 8.6–10.2)
Creatinine, Ser: 1.03 mg/dL (ref 0.76–1.27)
GFR calc non Af Amer: 71 mL/min/{1.73_m2} (ref >59)
GFR, EST AFRICAN AMERICAN: 82 mL/min/{1.73_m2} (ref >59)
GLUCOSE: 178 mg/dL — AB (ref 65–99)
Globulin, Total: 2.7 g/dL (ref 1.5–4.5)
POTASSIUM: 4.5 mmol/L (ref 3.5–5.2)
SODIUM: 141 mmol/L (ref 134–144)
TOTAL PROTEIN: 7 g/dL (ref 6.0–8.5)

## 2014-02-21 ENCOUNTER — Ambulatory Visit: Payer: Medicare Other | Admitting: Family Medicine

## 2014-03-01 ENCOUNTER — Encounter: Payer: Self-pay | Admitting: Internal Medicine

## 2014-03-14 ENCOUNTER — Telehealth: Payer: Self-pay | Admitting: Family Medicine

## 2014-03-15 ENCOUNTER — Ambulatory Visit (INDEPENDENT_AMBULATORY_CARE_PROVIDER_SITE_OTHER): Payer: Medicare Other | Admitting: Cardiology

## 2014-03-15 ENCOUNTER — Encounter: Payer: Self-pay | Admitting: Cardiology

## 2014-03-15 VITALS — BP 138/80 | HR 72 | Ht 66.0 in | Wt 223.0 lb

## 2014-03-15 DIAGNOSIS — E782 Mixed hyperlipidemia: Secondary | ICD-10-CM | POA: Diagnosis not present

## 2014-03-15 DIAGNOSIS — I2510 Atherosclerotic heart disease of native coronary artery without angina pectoris: Secondary | ICD-10-CM

## 2014-03-15 DIAGNOSIS — I10 Essential (primary) hypertension: Secondary | ICD-10-CM

## 2014-03-15 MED ORDER — AMLODIPINE BESYLATE 5 MG PO TABS: ORAL_TABLET | ORAL | Status: DC

## 2014-03-15 NOTE — Progress Notes (Signed)
Cardiology Office Note  Date: 03/15/2014   ID: KINNEY SACKMANN, DOB Apr 05, 1940, MRN [id number]  PCP: Wardell Honour, MD  Primary Cardiologist: Rozann Lesches, MD   Chief Complaint  Patient presents with  . Coronary Artery Disease  . Hyperlipidemia  . Hypertension    History of Present Illness: [name] is a 74 y.o. male last seen in September 2015. He presents for a routine visit today. Reports no increasing angina symptoms, stable NYHA class II dyspnea with typical activities. He still enjoys working outdoors on his land.  We reviewed his medications outlined below. He continues on aspirin, Norvasc, Lipitor, prazosin, and as needed nitroglycerin (has not required). Most recent lab work is noted below and shows good LDL control.  Cardiac catheterization May 2013 demonstrated nonobstructive disease with patent stent site in the RCA which was a small and severely diseased, nondominant vessel. We continue medical therapy and observation at this point.  Past Medical History  Diagnosis Date  . Type 2 diabetes mellitus   . Anxiety disorder   . Arthritis   . Essential hypertension, benign   . Coronary atherosclerosis of native coronary artery     BMS nondominant RCA 12/2004  . Acute encephalopathy 08/2010  . Adenomatous polyp   . Gallbladder cholesterolosis   . Diverticulosis   . Internal hemorrhoids   . Nephrolithiasis   . Chronic headaches   . Myocardial infarction     2006  . GERD (gastroesophageal reflux disease)   . Hyperlipidemia     Past Surgical History  Procedure Laterality Date  . Total knee arthroplasty    . Rotator cuff repair    . Joint replacement    . Upper gastrointestinal endoscopy  01/08/2008    bx, inlet patch, duodenitis  . Colonoscopy  09/10/2008    normal  . Cataract extraction    . Cystoscopy w/ ureteroscopy w/ lithotripsy  08/2010  . Cholecystectomy    . Left heart catheterization with coronary angiogram N/A 05/14/2011   Procedure: LEFT HEART CATHETERIZATION WITH CORONARY ANGIOGRAM;  Surgeon: Sherren Mocha, MD;  Location: [facility] CATH LAB;  Service: Cardiovascular;  Laterality: N/A;    Current Outpatient Prescriptions  Medication Sig Dispense Refill  . acetaminophen (TYLENOL) 500 MG tablet Take 500 mg by mouth every 6 (six) hours as needed. Takes 2 every morning    . AIMSCO INSULIN SYR ULTRA THIN 31G X 5/16" 0.3 ML MISC     . amLODipine (NORVASC) 5 MG tablet TAKE 1 TABLET DAILY 90 tablet 0  . aspirin 81 MG tablet Take 81 mg by mouth daily.    Marland Kitchen atorvastatin (LIPITOR) 20 MG tablet Take 1 tablet (20 mg total) by mouth daily at 6 PM. 90 tablet 1  . B-D ULTRAFINE III SHORT PEN 31G X 8 MM MISC USE 4 TIMES DAILY 100 each 2  . Blood Glucose Monitoring Suppl (ONE TOUCH ULTRA SYSTEM KIT) W/DEVICE KIT 1 kit by Does not apply route once. 1 each 0  . butalbital-acetaminophen-caffeine (FIORICET WITH CODEINE) 50-325-40-30 MG per capsule Take 1 capsule by mouth every 4 (four) hours as needed.      . cetirizine (ZYRTEC) 10 MG tablet Take 10 mg by mouth daily.      . chlorthalidone (HYGROTON) 25 MG tablet TAKE 1 TABLET BY MOUTH DAILY AS DIRECTED 90 tablet 3  . Cholecalciferol (VITAMIN D3) 2000 UNITS TABS Take 1 tablet by mouth daily.      . famotidine (PEPCID) 20 MG tablet Take 1  tablet (20 mg total) by mouth 2 (two) times daily. 180 tablet 1  . gabapentin (NEURONTIN) 300 MG capsule Take 1 capsule (300 mg total) by mouth 3 (three) times daily. 90 capsule 3  . insulin aspart (NOVOLOG) 100 UNIT/ML injection Inject 0-15 Units into the skin 3 (three) times daily before meals. 70-150=8units 151-200=9units 45 mL 3  . insulin glargine (LANTUS) 100 UNIT/ML injection Inject 0.52 mLs (52 Units total) into the skin at bedtime. 10 mL 1  . insulin lispro (HUMALOG) 100 UNIT/ML KiwkPen Inject 0-15 units into skin three times daily before meals. 70-150=8 units 151-200= 9 units 45 mL 3  . Insulin Pen Needle 31G X 8 MM MISC 4 Devices by Does not  apply route 4 (four) times daily. 400 each 1  . LANTUS SOLOSTAR 100 UNIT/ML Solostar Pen INJECT 52 UNITS INTO THE SKIN AT BEDTIME 45 pen 3  . metFORMIN (GLUCOPHAGE-XR) 500 MG 24 hr tablet Take 2 tablets (1,000 mg total) by mouth 2 (two) times daily. 360 tablet 1  . NITROSTAT 0.4 MG SL tablet USE AS DIRECTED AS NEEDED FOR CHEST PAIN 25 each 1  . ONE TOUCH ULTRA TEST test strip     . PARoxetine (PAXIL) 20 MG tablet Take 1 tablet (20 mg total) by mouth daily. 90 tablet 1  . prazosin (MINIPRESS) 1 MG capsule Take 1 capsule (1 mg total) by mouth 2 (two) times daily. 180 capsule 1  . sildenafil (REVATIO) 20 MG tablet 1 tab prn1 10 tablet 1   No current facility-administered medications for this visit.    Allergies:  Ace inhibitors; Angiotensin receptor blockers; Ciprofloxacin; Doxycycline; Oxycodone-acetaminophen; Ramipril; Toradol; and Valium   Social History: The patient  reports that he quit smoking about 25 years ago. His smoking use included Cigarettes. He has a 25 pack-year smoking history. He quit smokeless tobacco use about 25 years ago. His smokeless tobacco use included Chew. He reports that he drinks alcohol. He reports that he does not use illicit drugs.    ROS:  Please see the history of present illness. Otherwise, complete review of systems is positive for arthritic pains, mildly hard of hearing.  All other systems are reviewed and negative.    Physical Exam: VS:  BP 138/80 mmHg  Pulse 72  Ht '5\' 6"'  (1.676 m)  Wt 223 lb (101.152 kg)  BMI 36.01 kg/m2  SpO2 94%, BMI Body mass index is 36.01 kg/(m^2).  Wt Readings from Last 3 Encounters:  03/15/14 223 lb (101.152 kg)  02/19/14 220 lb (99.791 kg)  11/08/13 220 lb 6.4 oz (99.973 kg)     Obese male, appears comfortable at rest.  HEENT: Conjunctiva and lids normal, oropharynx clear.  Neck: Supple, no elevated JVP or carotid bruits, no thyromegaly.  Lungs: Clear to auscultation, nonlabored breathing at rest.  Cardiac: Regular  rate and rhythm, no S3 or significant systolic murmur, no pericardial rub.  Abdomen: Soft, nontender, bowel sounds present, no guarding or rebound.  Extremities: No pitting edema, distal pulses 2+.  Skin: Warm and dry.  Musculoskeletal: No kyphosis.  Neuropsychiatric: Alert and oriented x3, affect grossly appropriate.   ECG: ECG from 10/01/2013 reviewed showing normal sinus rhythm.   Recent Labwork: 04/10/2013: TSH 1.490 02/19/2014: ALT 33; AST 25; BUN 17; Creatinine 1.03; Potassium 4.5; Sodium 141     Component Value Date/Time   CHOL 133 02/19/2014 0949   CHOL 133 04/10/2013 0937   CHOL 139 06/02/2012 1007   TRIG 145 02/19/2014 0949   TRIG 185*  04/10/2013 0937   TRIG 107 06/02/2012 1007   HDL 38* 02/19/2014 0949   HDL 34* 04/10/2013 0937   HDL 40 06/02/2012 1007   HDL 24* 05/14/2011 0550   CHOLHDL 3.5 02/19/2014 0949   CHOLHDL 6.2 05/14/2011 0550   VLDL UNABLE TO CALCULATE IF TRIGLYCERIDE OVER 400 mg/dL 05/14/2011 0550   LDLCALC 66 02/19/2014 0949   LDLCALC 62 04/10/2013 0937   LDLCALC 78 06/02/2012 1007   LDLCALC UNABLE TO CALCULATE IF TRIGLYCERIDE OVER 400 mg/dL 05/14/2011 0550    Assessment and Plan:  1. Symptomatically stable with history of CAD, BMS to nondominant RCA in 2006, patent at cardiac catheterization in 2013 with residual diffuse disease in a small nondominant RCA. We continue medical therapy and observation. No changes were made today.  2. Hyperlipidemia, on Lipitor, recent LDL 66. No reported side effects. LFTs normal.  3. Essential hypertension, systolic in the 103U. Continue current regimen and keep follow-up with Dr. Sabra Heck.  Current medicines are reviewed at length with the patient today.  The patient does not have concerns regarding medicines.  Disposition: FU with me in 6 months.   Signed, Satira Sark, MD, [facility] 03/15/2014 8:51 AM    [facility] [place], [place], [place] [address] Phone: [phone]; Fax: [phone]

## 2014-03-15 NOTE — Telephone Encounter (Signed)
done

## 2014-03-15 NOTE — Patient Instructions (Signed)

## 2014-03-25 ENCOUNTER — Telehealth: Payer: Self-pay | Admitting: Family Medicine

## 2014-04-02 ENCOUNTER — Institutional Professional Consult (permissible substitution): Payer: Self-pay | Admitting: Pulmonary Disease

## 2014-04-04 NOTE — Telephone Encounter (Signed)
Form faxed for supplies 04/04/14

## 2014-04-16 DIAGNOSIS — M436 Torticollis: Secondary | ICD-10-CM | POA: Diagnosis not present

## 2014-04-17 ENCOUNTER — Ambulatory Visit (INDEPENDENT_AMBULATORY_CARE_PROVIDER_SITE_OTHER): Payer: Medicare Other

## 2014-04-17 ENCOUNTER — Encounter: Payer: Self-pay | Admitting: Physician Assistant

## 2014-04-17 ENCOUNTER — Ambulatory Visit (INDEPENDENT_AMBULATORY_CARE_PROVIDER_SITE_OTHER): Payer: Medicare Other | Admitting: Physician Assistant

## 2014-04-17 DIAGNOSIS — M25511 Pain in right shoulder: Secondary | ICD-10-CM

## 2014-04-17 MED ORDER — NAPROXEN 500 MG PO TABS: 500.0000 mg | ORAL_TABLET | Freq: Two times a day (BID) | ORAL | Status: DC

## 2014-04-17 MED ORDER — HYDROCODONE-ACETAMINOPHEN 5-325 MG PO TABS: 1.0000 | ORAL_TABLET | Freq: Four times a day (QID) | ORAL | Status: DC | PRN

## 2014-04-17 MED ORDER — CYCLOBENZAPRINE HCL 10 MG PO TABS: 10.0000 mg | ORAL_TABLET | Freq: Three times a day (TID) | ORAL | Status: DC | PRN

## 2014-04-17 NOTE — Progress Notes (Signed)
   Subjective:    Patient ID: [name], male    DOB: 1940/05/28, 74 y.o.   MRN: [id number]  HPI 74 y/o male presents with right shoulder pain x 3 days. He went to Urgent Care yesterday and was given naproxen 500mg  BID, Norco 5/325 q 6 hrs and Cyclobenzaprine 10 mg 1 PO TID with no relief. He had RCR in 2012 on same shoulder and pain feels the same. Denies trauma.    Review of Systems  Musculoskeletal: Positive for myalgias (constant ache. Worse with right neck rotation.Decreased with forward flexion and bending arem behind his head), arthralgias and neck pain.  Neurological: Negative for weakness and numbness.       Objective:   Physical Exam  Musculoskeletal: He exhibits tenderness.  Decreased right neck rotation, neck flexionand all shoulder movements d/t complaint of pain.           Assessment & Plan:  1 Right shoulder pain: Rest, ice. Refills given for Norco 5/325, Cyclobenzaprine and Naproxen. Called [facility]. Patient has appt on 05/08/14 with Dr. Theda Sers. If pain worsens, he should report to the ER. I have also advise him to f/u with ortho to see if they can see him sooner.

## 2014-04-17 NOTE — Patient Instructions (Signed)
Shoulder Pain  The shoulder is the joint that connects your arm to your body. Muscles and band-like tissues that connect bones to muscles (tendons) hold the joint together. Shoulder pain is felt if an injury or medical problem affects one or more parts of the shoulder.  HOME CARE   · Put ice on the sore area.  ¨ Put ice in a plastic bag.  ¨ Place a towel between your skin and the bag.  ¨ Leave the ice on for 15-20 minutes, 03-04 times a day for the first 2 days.  · Stop using cold packs if they do not help with the pain.  · If you were given something to keep your shoulder from moving (sling; shoulder immobilizer), wear it as told. Only take it off to shower or bathe.  · Move your arm as little as possible, but keep your hand moving to prevent puffiness (swelling).  · Squeeze a soft ball or foam pad as much as possible to help prevent swelling.  · Take medicine as told by your doctor.  GET HELP IF:  · You have progressing new pain in your arm, hand, or fingers.  · Your hand or fingers get cold.  · Your medicine does not help lessen your pain.  GET HELP RIGHT AWAY IF:   · Your arm, hand, or fingers are numb or tingling.  · Your arm, hand, or fingers are puffy (swollen), painful, or turn white or blue.  MAKE SURE YOU:   · Understand these instructions.  · Will watch your condition.  · Will get help right away if you are not doing well or get worse.  Document Released: 06/16/2007 Document Revised: 05/14/2013 Document Reviewed: 07/12/2011  ExitCare® Patient Information ©2015 [employer]. This information is not intended to replace advice given to you by your health care provider. Make sure you discuss any questions you have with your health care provider.

## 2014-04-22 ENCOUNTER — Other Ambulatory Visit: Payer: Self-pay | Admitting: *Deleted

## 2014-04-22 MED ORDER — FAMOTIDINE 20 MG PO TABS
20.0000 mg | ORAL_TABLET | Freq: Two times a day (BID) | ORAL | Status: DC
Start: 2014-04-22 — End: 2014-09-04

## 2014-04-25 ENCOUNTER — Ambulatory Visit (AMBULATORY_SURGERY_CENTER): Payer: Self-pay

## 2014-04-25 VITALS — Ht 66.0 in | Wt 222.0 lb

## 2014-04-25 DIAGNOSIS — Z800 Family history of malignant neoplasm of digestive organs: Secondary | ICD-10-CM

## 2014-04-25 DIAGNOSIS — M25511 Pain in right shoulder: Secondary | ICD-10-CM | POA: Diagnosis not present

## 2014-04-25 DIAGNOSIS — M47812 Spondylosis without myelopathy or radiculopathy, cervical region: Secondary | ICD-10-CM | POA: Diagnosis not present

## 2014-04-25 NOTE — Progress Notes (Signed)
Per pt, no allergies to soy or egg products.Pt not taking any weight loss meds or using  O2 at home. 

## 2014-05-01 ENCOUNTER — Other Ambulatory Visit: Payer: Self-pay

## 2014-05-01 MED ORDER — METFORMIN HCL ER 500 MG PO TB24: 1000.0000 mg | ORAL_TABLET | Freq: Two times a day (BID) | ORAL | Status: DC

## 2014-05-03 ENCOUNTER — Telehealth: Payer: Self-pay | Admitting: Family Medicine

## 2014-05-03 ENCOUNTER — Other Ambulatory Visit: Payer: Self-pay

## 2014-05-03 MED ORDER — PAROXETINE HCL 20 MG PO TABS: 20.0000 mg | ORAL_TABLET | Freq: Every day | ORAL | Status: DC

## 2014-05-09 ENCOUNTER — Ambulatory Visit (AMBULATORY_SURGERY_CENTER): Payer: Medicare Other | Admitting: Internal Medicine

## 2014-05-09 ENCOUNTER — Encounter: Payer: Self-pay | Admitting: Internal Medicine

## 2014-05-09 VITALS — BP 133/79 | HR 70 | Temp 98.8°F | Resp 12 | Ht 66.0 in | Wt 222.0 lb

## 2014-05-09 DIAGNOSIS — Z86010 Personal history of colonic polyps: Secondary | ICD-10-CM | POA: Diagnosis present

## 2014-05-09 DIAGNOSIS — D122 Benign neoplasm of ascending colon: Secondary | ICD-10-CM | POA: Diagnosis not present

## 2014-05-09 DIAGNOSIS — D124 Benign neoplasm of descending colon: Secondary | ICD-10-CM

## 2014-05-09 DIAGNOSIS — Z800 Family history of malignant neoplasm of digestive organs: Secondary | ICD-10-CM | POA: Diagnosis not present

## 2014-05-09 DIAGNOSIS — I10 Essential (primary) hypertension: Secondary | ICD-10-CM | POA: Diagnosis not present

## 2014-05-09 LAB — GLUCOSE, CAPILLARY
GLUCOSE-CAPILLARY: 192 mg/dL — AB (ref 70–99)
Glucose-Capillary: 168 mg/dL — ABNORMAL HIGH (ref 70–99)

## 2014-05-09 MED ORDER — SODIUM CHLORIDE 0.9 % IV SOLN: 500.0000 mL | INTRAVENOUS | Status: DC

## 2014-05-09 NOTE — Progress Notes (Signed)
Called to room to assist during endoscopic procedure.  Patient ID and intended procedure confirmed with present staff. Received instructions for my participation in the procedure from the performing physician.  

## 2014-05-09 NOTE — Progress Notes (Signed)
Report to PACU, RN, vss, BBS= Clear.  

## 2014-05-09 NOTE — Assessment & Plan Note (Signed)
Remote hx polyps (no records history of pre-cancerous (adenomatous) colon polyps 2 polyps removed 2007, 1 lost 1 proven adenoma family hx colon cancer mom at 59-60 No polyps 2010 05/09/2014 5 mm and 8 mm polyps removed

## 2014-05-09 NOTE — Op Note (Signed)
[place]  [address]. [place], [id number]   COLONOSCOPY PROCEDURE REPORT  PATIENT: [name], [name]  MR#: [id number] BIRTHDATE: 21-Feb-1940 , 74  yrs. old GENDER: male ENDOSCOPIST: Gatha Mayer, MD, [facility] PROCEDURE DATE:  05/09/2014 PROCEDURE:   Colonoscopy, surveillance and Colonoscopy with snare polypectomy First Screening Colonoscopy - Avg.  risk and is 50 yrs.  old or older - No.  Prior Negative Screening - Now for repeat screening. N/A  History of Adenoma - Now for follow-up colonoscopy & has been > or = to 3 yrs.  Yes hx of adenoma.  Has been 3 or more years since last colonoscopy. ASA CLASS:   Class III INDICATIONS:Surveillance due to prior colonic neoplasia and PH Colon Adenoma. MEDICATIONS: Propofol 200 mg IV and Monitored anesthesia care  DESCRIPTION OF PROCEDURE:   After the risks benefits and alternatives of the procedure were thoroughly explained, informed consent was obtained.  The digital rectal exam revealed no abnormalities of the rectum, revealed no prostatic nodules, and revealed the prostate was not enlarged.   The LB [id number] endoscope was introduced through the anus and advanced to the cecum, which was identified by both the appendix and ileocecal valve. No adverse events experienced.   The quality of the prep was (MiraLax was used) adequate  The instrument was then slowly withdrawn as the colon was fully examined.     COLON FINDINGS: Two sessile polyps ranging from 5 to 65mm in size were found in the descending colon and ascending colon. Polypectomies were performed with a cold snare.  The resection was complete, the polyp tissue was completely retrieved and sent to histology.   The examination was otherwise normal.  Retroflexed views revealed no abnormalities. The time to cecum = 2.1 Withdrawal time = 15.7   The scope was withdrawn and the procedure completed. COMPLICATIONS: There were no immediate  complications.  ENDOSCOPIC IMPRESSION: 1.   Two sessile polyps ranging from 5 to 27mm in size were found in the descending colon and ascending colon; polypectomies were performed with a cold snare 2.   The examination was otherwise normal  RECOMMENDATIONS: Possibility  repeat colonoscopy will be determined by pathology findings. He is 74 so may not need another.  eSigned:  Gatha Mayer, MD, [facility] 05/09/2014 12:17 PM   cc: The Patient  and Alain Honey, MD

## 2014-05-09 NOTE — Progress Notes (Signed)
No problems noted in the recovery room. maw 

## 2014-05-09 NOTE — Patient Instructions (Addendum)
I found and removed two small polyps today. I will let you know pathology results and if/when to have another routine colonoscopy by mail.  I appreciate the opportunity to care for you. Gatha Mayer, MD, FACG    YOU HAD AN ENDOSCOPIC PROCEDURE TODAY AT [facility]:   Refer to the procedure report that was given to you for any specific questions about what was found during the examination.  If the procedure report does not answer your questions, please call your gastroenterologist to clarify.  If you requested that your care partner not be given the details of your procedure findings, then the procedure report has been included in a sealed envelope for you to review at your convenience later.  YOU SHOULD EXPECT: Some feelings of bloating in the abdomen. Passage of more gas than usual.  Walking can help get rid of the air that was put into your GI tract during the procedure and reduce the bloating. If you had a lower endoscopy (such as a colonoscopy or flexible sigmoidoscopy) you may notice spotting of blood in your stool or on the toilet paper. If you underwent a bowel prep for your procedure, you may not have a normal bowel movement for a few days.  Please Note:  You might notice some irritation and congestion in your nose or some drainage.  This is from the oxygen used during your procedure.  There is no need for concern and it should clear up in a day or so.  SYMPTOMS TO REPORT IMMEDIATELY:   Following lower endoscopy (colonoscopy or flexible sigmoidoscopy):  Excessive amounts of blood in the stool  Significant tenderness or worsening of abdominal pains  Swelling of the abdomen that is new, acute  Fever of 100F or higher   For urgent or emergent issues, a gastroenterologist can be reached at any hour by calling [phone].   DIET: Your first meal following the procedure should be a small meal and then it is ok to progress to your normal diet. Heavy or  fried foods are harder to digest and may make you feel nauseous or bloated.  Likewise, meals heavy in dairy and vegetables can increase bloating.  Drink plenty of fluids but you should avoid alcoholic beverages for 24 hours.  ACTIVITY:  You should plan to take it easy for the rest of today and you should NOT DRIVE or use heavy machinery until tomorrow (because of the sedation medicines used during the test).    FOLLOW UP: Our staff will call the number listed on your records the next business day following your procedure to check on you and address any questions or concerns that you may have regarding the information given to you following your procedure. If we do not reach you, we will leave a message.  However, if you are feeling well and you are not experiencing any problems, there is no need to return our call.  We will assume that you have returned to your regular daily activities without incident.  If any biopsies were taken you will be contacted by phone or by letter within the next 1-3 weeks.  Please call us at [phone] if you have not heard about the biopsies in 3 weeks.    SIGNATURES/CONFIDENTIALITY: You and/or your care partner have signed paperwork which will be entered into your electronic medical record.  These signatures attest to the fact that that the information above on your After Visit Summary has been reviewed and is  understood.  Full responsibility of the confidentiality of this discharge information lies with you and/or your care-partner.    Handout was given to your care partner on polyps. You may resume your current medications today. Await biopsy results. Please call if any questions or concerns.

## 2014-05-10 ENCOUNTER — Telehealth: Payer: Self-pay

## 2014-05-10 NOTE — Telephone Encounter (Signed)
  Follow up Call-  Call back number 05/09/2014  Post procedure Call Back phone  # [phone]  Permission to leave phone message Yes     Patient questions:  Do you have a fever, pain , or abdominal swelling? No. Pain Score  0 *  Have you tolerated food without any problems? Yes.    Have you been able to return to your normal activities? Yes.    Do you have any questions about your discharge instructions: Diet   No. Medications  No. Follow up visit  No.  Do you have questions or concerns about your Care? No.  Actions: * If pain score is 4 or above: No action needed, pain <4.

## 2014-05-14 ENCOUNTER — Encounter: Payer: Self-pay | Admitting: Internal Medicine

## 2014-05-14 NOTE — Progress Notes (Signed)
Quick Note:  ssp and adenoma < 1 cm Would not repeat colonoscopy routine in future given findings and age ______

## 2014-05-17 DIAGNOSIS — M47812 Spondylosis without myelopathy or radiculopathy, cervical region: Secondary | ICD-10-CM | POA: Diagnosis not present

## 2014-05-17 DIAGNOSIS — M5090 Cervical disc disorder, unspecified, unspecified cervical region: Secondary | ICD-10-CM | POA: Diagnosis not present

## 2014-05-27 ENCOUNTER — Other Ambulatory Visit: Payer: Self-pay | Admitting: *Deleted

## 2014-05-27 MED ORDER — INSULIN GLARGINE 100 UNIT/ML SOLOSTAR PEN: PEN_INJECTOR | SUBCUTANEOUS | Status: DC

## 2014-05-30 ENCOUNTER — Encounter: Payer: Self-pay | Admitting: Family Medicine

## 2014-05-30 ENCOUNTER — Ambulatory Visit (INDEPENDENT_AMBULATORY_CARE_PROVIDER_SITE_OTHER): Payer: Medicare Other | Admitting: Family Medicine

## 2014-05-30 VITALS — BP 120/68 | HR 68 | Temp 97.3°F | Ht 66.0 in | Wt 219.0 lb

## 2014-05-30 DIAGNOSIS — E1169 Type 2 diabetes mellitus with other specified complication: Secondary | ICD-10-CM | POA: Diagnosis not present

## 2014-05-30 DIAGNOSIS — K219 Gastro-esophageal reflux disease without esophagitis: Secondary | ICD-10-CM

## 2014-05-30 DIAGNOSIS — E785 Hyperlipidemia, unspecified: Secondary | ICD-10-CM

## 2014-05-30 DIAGNOSIS — E1159 Type 2 diabetes mellitus with other circulatory complications: Secondary | ICD-10-CM | POA: Diagnosis not present

## 2014-05-30 DIAGNOSIS — I10 Essential (primary) hypertension: Secondary | ICD-10-CM

## 2014-05-30 LAB — POCT GLYCOSYLATED HEMOGLOBIN (HGB A1C): Hemoglobin A1C: 8.5

## 2014-05-30 NOTE — Progress Notes (Signed)
Subjective:    Patient ID: [name], male    DOB: 1940-05-19, 74 y.o.   MRN: [id number]  HPI  74 year old gentleman here to follow-up diabetes and hyperlipidemia. His wife who is an LPN helps manage his medical problems. Within the last 6 months he has shown some protein in his urine as he is unable to take an Ace or an arb to help there. He was also seen recently for shoulder pain both here and at the orthopedic office and that seems to have resolved now. Lipids were checked in February and LDL is at goal.    Review of Systems  Constitutional: Negative.   HENT: Negative.   Respiratory: Negative.   Cardiovascular: Negative.   Genitourinary: Negative.   Neurological: Negative.   Psychiatric/Behavioral: Negative.    Patient Active Problem List   Diagnosis Date Noted  . Hx of adenomatous colonic polyps 05/09/2014  . Obesity (BMI 30-39.9) 07/30/2013  . Type 2 diabetes mellitus with circulatory disorder 05/27/2011  . Hyperlipidemia associated with type 2 diabetes mellitus 10/19/2008  . Essential hypertension, benign 10/19/2008  . CORONARY ATHEROSCLEROSIS NATIVE CORONARY ARTERY 10/17/2008  . GERD (gastroesophageal reflux disease) 12/27/2007   Outpatient Encounter Prescriptions as of 05/30/2014  Medication Sig  . Insulin Glargine (LANTUS SOLOSTAR) 100 UNIT/ML Solostar Pen INJECT 52 UNITS INTO THE SKIN AT BEDTIME  . insulin lispro (HUMALOG) 100 UNIT/ML KiwkPen Inject 0-15 units into skin three times daily before meals. 70-150=8 units 151-200= 9 units  . Insulin Pen Needle 31G X 8 MM MISC 4 Devices by Does not apply route 4 (four) times daily.  . metFORMIN (GLUCOPHAGE-XR) 500 MG 24 hr tablet Take 2 tablets (1,000 mg total) by mouth 2 (two) times daily.  Marland Kitchen NITROSTAT 0.4 MG SL tablet USE AS DIRECTED AS NEEDED FOR CHEST PAIN  . ONE TOUCH ULTRA TEST test strip   . PARoxetine (PAXIL) 20 MG tablet Take 1 tablet (20 mg total) by mouth daily.  . prazosin (MINIPRESS) 1 MG capsule Take 1  capsule (1 mg total) by mouth 2 (two) times daily.  Marland Kitchen acetaminophen (TYLENOL) 650 MG CR tablet Take 650 mg by mouth every 8 (eight) hours as needed for pain.  Marland Kitchen AIMSCO INSULIN SYR ULTRA THIN 31G X 5/16" 0.3 ML MISC   . amLODipine (NORVASC) 5 MG tablet TAKE 1 TABLET DAILY  . aspirin 81 MG tablet Take 81 mg by mouth daily.  Marland Kitchen atorvastatin (LIPITOR) 20 MG tablet Take 1 tablet (20 mg total) by mouth daily at 6 PM.  . B-D ULTRAFINE III SHORT PEN 31G X 8 MM MISC USE 4 TIMES DAILY  . bisacodyl (DULCOLAX) 5 MG EC tablet Take 5 mg by mouth. Dulcolax 5 mg bowel prep # 4-Take as directed  . Blood Glucose Monitoring Suppl (ONE TOUCH ULTRA SYSTEM KIT) W/DEVICE KIT 1 kit by Does not apply route once.  . cetirizine (ZYRTEC) 10 MG tablet Take 10 mg by mouth daily.    . chlorthalidone (HYGROTON) 25 MG tablet TAKE 1 TABLET BY MOUTH DAILY AS DIRECTED  . Cholecalciferol (VITAMIN D3) 2000 UNITS TABS Take 1 tablet by mouth daily.    . cyclobenzaprine (FLEXERIL) 10 MG tablet Take 10 mg by mouth 3 (three) times daily as needed for muscle spasms.  . famotidine (PEPCID) 20 MG tablet Take 1 tablet (20 mg total) by mouth 2 (two) times daily.  Marland Kitchen HYDROcodone-acetaminophen (NORCO/VICODIN) 5-325 MG per tablet Take 1 tablet by mouth every 6 (six) hours as needed for moderate  pain.  . [DISCONTINUED] acetaminophen (TYLENOL) 500 MG tablet Take 500 mg by mouth every 6 (six) hours as needed. Takes 2 every morning  . [DISCONTINUED] butalbital-acetaminophen-caffeine (FIORICET WITH CODEINE) 50-325-40-30 MG per capsule Take 1 capsule by mouth every 4 (four) hours as needed.    . [DISCONTINUED] cyclobenzaprine (FLEXERIL) 10 MG tablet Take 1 tablet (10 mg total) by mouth 3 (three) times daily as needed for muscle spasms. (Patient not taking: Reported on 05/09/2014)  . [DISCONTINUED] gabapentin (NEURONTIN) 300 MG capsule Take 1 capsule (300 mg total) by mouth 3 (three) times daily. (Patient not taking: Reported on 05/09/2014)  . [DISCONTINUED]  HYDROcodone-acetaminophen (NORCO/VICODIN) 5-325 MG per tablet Take 1 tablet by mouth every 6 (six) hours as needed for moderate pain. (Patient not taking: Reported on 04/25/2014)  . [DISCONTINUED] insulin aspart (NOVOLOG) 100 UNIT/ML injection Inject 0-15 Units into the skin 3 (three) times daily before meals. 70-150=8units 151-200=9units (Patient not taking: Reported on 04/25/2014)  . [DISCONTINUED] naproxen (NAPROSYN) 500 MG tablet Take 500 mg by mouth 2 (two) times daily with a meal.  . [DISCONTINUED] naproxen (NAPROSYN) 500 MG tablet Take 1 tablet (500 mg total) by mouth 2 (two) times daily with a meal. (Patient not taking: Reported on 04/25/2014)  . [DISCONTINUED] polyethylene glycol powder (GLYCOLAX/MIRALAX) powder Take 1 Container by mouth once. Miralax bowel prep 238 gms-Take as directed  . [DISCONTINUED] sildenafil (REVATIO) 20 MG tablet 1 tab prn1 (Patient not taking: Reported on 05/09/2014)   No facility-administered encounter medications on file as of 05/30/2014.       Objective:   Physical Exam  Constitutional: He is oriented to person, place, and time. He appears well-developed and well-nourished.  HENT:  Head: Normocephalic.  Eyes: Pupils are equal, round, and reactive to light.  Cardiovascular: Regular rhythm and intact distal pulses.   Pulmonary/Chest: Effort normal and breath sounds normal.  Musculoskeletal: Normal range of motion.  Neurological: He is alert and oriented to person, place, and time.          Assessment & Plan:  1. Type 2 diabetes mellitus with other circulatory complications Last W6O was 7.7. This is not quite at goal. We will not change medicines pending today's result - POCT glycosylated hemoglobin (Hb A1C)  2. Hyperlipidemia associated with type 2 diabetes mellitus As noted above, lipids are at goal continue same treatment  3. Essential hypertension, benign Pressure is good today. He cannot take an ace  4. Gastroesophageal reflux disease,  esophagitis presence not specified Currently managed on H2 antagonist  Wardell Honour MD

## 2014-05-31 ENCOUNTER — Encounter: Payer: Self-pay | Admitting: Family Medicine

## 2014-06-03 MED ORDER — CANAGLIFLOZIN 300 MG PO TABS: 300.0000 mg | ORAL_TABLET | Freq: Every day | ORAL | Status: DC

## 2014-06-03 NOTE — Telephone Encounter (Signed)
Per Dr. Sabra Heck, ok to start pt on Invokana 300mg  qam. Prescription sent to pharmacy

## 2014-07-08 ENCOUNTER — Other Ambulatory Visit: Payer: Self-pay

## 2014-07-12 HISTORY — PX: CATARACT EXTRACTION W/ INTRAOCULAR LENS IMPLANT: SHX1309

## 2014-07-25 DIAGNOSIS — E119 Type 2 diabetes mellitus without complications: Secondary | ICD-10-CM | POA: Diagnosis not present

## 2014-07-25 LAB — HM DIABETES EYE EXAM

## 2014-07-29 ENCOUNTER — Encounter (HOSPITAL_COMMUNITY)
Admission: RE | Disposition: A | Discharge status: Home / Self Care | Payer: Self-pay | Source: Ambulatory Visit | Attending: Ophthalmology

## 2014-07-29 ENCOUNTER — Ambulatory Visit (HOSPITAL_COMMUNITY)
Admission: RE | Admit: 2014-07-29 | Discharge: 2014-07-29 | Disposition: A | Discharge status: Home / Self Care | Payer: Medicare Other | Source: Ambulatory Visit | Attending: Ophthalmology | Admitting: Ophthalmology

## 2014-07-29 DIAGNOSIS — H2512 Age-related nuclear cataract, left eye: Secondary | ICD-10-CM | POA: Diagnosis not present

## 2014-07-29 DIAGNOSIS — I10 Essential (primary) hypertension: Secondary | ICD-10-CM | POA: Insufficient documentation

## 2014-07-29 DIAGNOSIS — H26491 Other secondary cataract, right eye: Secondary | ICD-10-CM | POA: Diagnosis not present

## 2014-07-29 DIAGNOSIS — M1990 Unspecified osteoarthritis, unspecified site: Secondary | ICD-10-CM | POA: Insufficient documentation

## 2014-07-29 DIAGNOSIS — Z79899 Other long term (current) drug therapy: Secondary | ICD-10-CM | POA: Insufficient documentation

## 2014-07-29 DIAGNOSIS — Z7982 Long term (current) use of aspirin: Secondary | ICD-10-CM | POA: Insufficient documentation

## 2014-07-29 DIAGNOSIS — H9190 Unspecified hearing loss, unspecified ear: Secondary | ICD-10-CM | POA: Insufficient documentation

## 2014-07-29 DIAGNOSIS — E780 Pure hypercholesterolemia: Secondary | ICD-10-CM | POA: Insufficient documentation

## 2014-07-29 DIAGNOSIS — H524 Presbyopia: Secondary | ICD-10-CM | POA: Diagnosis not present

## 2014-07-29 DIAGNOSIS — H5211 Myopia, right eye: Secondary | ICD-10-CM | POA: Insufficient documentation

## 2014-07-29 DIAGNOSIS — H538 Other visual disturbances: Secondary | ICD-10-CM | POA: Diagnosis not present

## 2014-07-29 HISTORY — PX: YAG LASER APPLICATION: SHX6189

## 2014-07-29 SURGERY — TREATMENT, USING YAG LASER
Anesthesia: LOCAL | Laterality: Right

## 2014-07-29 MED ORDER — TETRACAINE HCL 0.5 % OP SOLN
1.0000 [drp] | OPHTHALMIC | Status: AC
  Administered 2014-07-29 (×3): 1 [drp] via OPHTHALMIC

## 2014-07-29 MED ORDER — TROPICAMIDE 1 % OP SOLN
OPHTHALMIC | Status: AC
  Filled 2014-07-29: qty 3

## 2014-07-29 MED ORDER — TETRACAINE HCL 0.5 % OP SOLN
OPHTHALMIC | Status: AC
  Filled 2014-07-29: qty 2

## 2014-07-29 MED ORDER — TROPICAMIDE 1 % OP SOLN
1.0000 [drp] | OPHTHALMIC | Status: AC
  Administered 2014-07-29 (×3): 1 [drp] via OPHTHALMIC

## 2014-07-29 NOTE — Discharge Instructions (Signed)
BRODERIC BARA  07/29/2014     Instructions    Activity: No Restrictions.   Diet: Resume Diet you were on at home.   Pain Medication: Tylenol if Needed. in 2 weeks  CONTACT YOUR DOCTOR IF YOU HAVE PAIN, REDNESS IN YOUR EYE, OR DECREASED VISION.   Follow-up: with Williams Che, MD.   Dr. Gershon Crane: [phone]  Dr. Iona Hansen: [phone]  Dr. Geoffry Paradise: [phone]   If you find that you cannot contact your physician, but feel that your signs and   Symptoms warrant a physician's attention, call the Emergency Room at   [phone] ext.[phone].   Othern/a.     FOLLOW UP WITH DR HAINES ON 08/20/2014 @3 :45 PM

## 2014-07-29 NOTE — Brief Op Note (Signed)
[name] 07/29/2014  Williams Che, MD  Pre-op Diagnosis:  secondary cataract right eye  Post-op Diagnosis:   same  Yag laser self-test completed: Yes.    Indications:  See scanned office H&P for specific indications  Procedure: YAG cspdulotomy OD  Eye protection worn by staff:  Yes.   Laser In Use sign on door:  Yes.    Laser:  {LUMENIS YAG/SLT LASER: selecta duet  Power Setting:  1.7 mJ/burst Anatomical site treated:  posterior capsule OD Number of applications:  17 Total energy delivered: 26.41 mJ Results:  Clear visual axis OD  Patient was instructed to go to the office, as previously scheduled, for intraocular pressure:  No.  Patient verbalizes understanding of discharge instructions:  Yes.    Notes:  Pt tolerated procedure well

## 2014-07-29 NOTE — H&P (Signed)
Office H&P to be scanned 

## 2014-07-29 NOTE — Op Note (Signed)
None required

## 2014-07-30 ENCOUNTER — Encounter (HOSPITAL_COMMUNITY): Payer: Self-pay | Admitting: Ophthalmology

## 2014-08-02 ENCOUNTER — Other Ambulatory Visit: Payer: Self-pay | Admitting: Physician Assistant

## 2014-08-28 ENCOUNTER — Encounter: Payer: Self-pay | Admitting: *Deleted

## 2014-09-04 ENCOUNTER — Encounter: Payer: Self-pay | Admitting: Family Medicine

## 2014-09-04 ENCOUNTER — Ambulatory Visit (INDEPENDENT_AMBULATORY_CARE_PROVIDER_SITE_OTHER): Payer: Medicare Other | Admitting: Family Medicine

## 2014-09-04 VITALS — BP 128/76 | HR 75 | Temp 97.0°F | Ht 66.0 in | Wt 220.4 lb

## 2014-09-04 DIAGNOSIS — E785 Hyperlipidemia, unspecified: Secondary | ICD-10-CM | POA: Diagnosis not present

## 2014-09-04 DIAGNOSIS — E1159 Type 2 diabetes mellitus with other circulatory complications: Secondary | ICD-10-CM | POA: Diagnosis not present

## 2014-09-04 DIAGNOSIS — E1169 Type 2 diabetes mellitus with other specified complication: Secondary | ICD-10-CM

## 2014-09-04 LAB — POCT GLYCOSYLATED HEMOGLOBIN (HGB A1C): Hemoglobin A1C: 7.9

## 2014-09-04 MED ORDER — ATORVASTATIN CALCIUM 20 MG PO TABS: 20.0000 mg | ORAL_TABLET | Freq: Every day | ORAL | Status: DC

## 2014-09-04 MED ORDER — CHLORTHALIDONE 25 MG PO TABS: 25.0000 mg | ORAL_TABLET | Freq: Every day | ORAL | Status: DC

## 2014-09-04 MED ORDER — INSULIN GLARGINE 100 UNIT/ML SOLOSTAR PEN: PEN_INJECTOR | SUBCUTANEOUS | Status: DC

## 2014-09-04 MED ORDER — FAMOTIDINE 20 MG PO TABS: 20.0000 mg | ORAL_TABLET | Freq: Two times a day (BID) | ORAL | Status: DC

## 2014-09-04 MED ORDER — INSULIN LISPRO 100 UNIT/ML (KWIKPEN): PEN_INJECTOR | SUBCUTANEOUS | Status: DC

## 2014-09-04 MED ORDER — AMLODIPINE BESYLATE 5 MG PO TABS: ORAL_TABLET | ORAL | Status: DC

## 2014-09-04 MED ORDER — PAROXETINE HCL 20 MG PO TABS: ORAL_TABLET | ORAL | Status: DC

## 2014-09-04 MED ORDER — METFORMIN HCL ER 500 MG PO TB24: ORAL_TABLET | ORAL | Status: DC

## 2014-09-04 NOTE — Addendum Note (Signed)
Addended by: Thana Ates on: 09/04/2014 10:56 AM   Modules accepted: Orders

## 2014-09-04 NOTE — Progress Notes (Signed)
Subjective:    Patient ID: [name], male    DOB: 1940/03/28, 74 y.o.   MRN: [id number]  HPI 74 year old male who is here to follow-up diabetes, hypertension, and hyperlipidemia. He also has a history of coronary artery disease but has not taken nitroglycerin recently. For his diabetes he takes metformin as well as Humalog and Lantus insulin. He feels like his sugars will be better but I think he probably is not very compliant in terms of diet and certainly has not lost any weight  Patient Active Problem List   Diagnosis Date Noted  . Hx of adenomatous colonic polyps 05/09/2014  . Obesity (BMI 30-39.9) 07/30/2013  . Type 2 diabetes mellitus with circulatory disorder 05/27/2011  . Hyperlipidemia associated with type 2 diabetes mellitus 10/19/2008  . Essential hypertension, benign 10/19/2008  . CORONARY ATHEROSCLEROSIS NATIVE CORONARY ARTERY 10/17/2008  . GERD (gastroesophageal reflux disease) 12/27/2007   Outpatient Encounter Prescriptions as of 09/04/2014  Medication Sig  . amLODipine (NORVASC) 5 MG tablet TAKE 1 TABLET DAILY (Patient taking differently: Take 5 mg by mouth daily. TAKE 1 TABLET DAILY)  . aspirin 81 MG tablet Take 81 mg by mouth daily.  Marland Kitchen atorvastatin (LIPITOR) 20 MG tablet Take 1 tablet (20 mg total) by mouth daily at 6 PM.  . butalbital-acetaminophen-caffeine (FIORICET, ESGIC) 50-325-40 MG per tablet Take 1 tablet by mouth 2 (two) times daily as needed for headache.  . cetirizine (ZYRTEC) 10 MG tablet Take 10 mg by mouth daily.    . chlorthalidone (HYGROTON) 25 MG tablet TAKE 1 TABLET BY MOUTH DAILY AS DIRECTED  . Cholecalciferol (VITAMIN D3) 2000 UNITS TABS Take 1 tablet by mouth daily.    . famotidine (PEPCID) 20 MG tablet Take 1 tablet (20 mg total) by mouth 2 (two) times daily.  . Insulin Glargine (LANTUS SOLOSTAR) 100 UNIT/ML Solostar Pen INJECT 52 UNITS INTO THE SKIN AT BEDTIME (Patient taking differently: Inject 52 Units into the skin at bedtime. INJECT 52  UNITS INTO THE SKIN AT BEDTIME)  . insulin lispro (HUMALOG) 100 UNIT/ML KiwkPen Inject 0-15 units into skin three times daily before meals. 70-150=8 units 151-200= 9 units  . metFORMIN (GLUCOPHAGE-XR) 500 MG 24 hr tablet TAKE 2 TABLETS (1,000 MG TOTAL) BY MOUTH 2 (TWO) TIMES DAILY.  Marland Kitchen NITROSTAT 0.4 MG SL tablet USE AS DIRECTED AS NEEDED FOR CHEST PAIN  . PARoxetine (PAXIL) 20 MG tablet TAKE 1 TABLET (20 MG TOTAL) BY MOUTH DAILY.  . prazosin (MINIPRESS) 1 MG capsule Take 1 capsule (1 mg total) by mouth 2 (two) times daily.  Marland Kitchen AIMSCO INSULIN SYR ULTRA THIN 31G X 5/16" 0.3 ML MISC   . B-D ULTRAFINE III SHORT PEN 31G X 8 MM MISC USE 4 TIMES DAILY  . Blood Glucose Monitoring Suppl (ONE TOUCH ULTRA SYSTEM KIT) W/DEVICE KIT 1 kit by Does not apply route once.  . Insulin Pen Needle 31G X 8 MM MISC 4 Devices by Does not apply route 4 (four) times daily.  . ONE TOUCH ULTRA TEST test strip   . [DISCONTINUED] acetaminophen (TYLENOL) 650 MG CR tablet Take 650 mg by mouth every 8 (eight) hours as needed for pain.  . [DISCONTINUED] canagliflozin (INVOKANA) 300 MG TABS tablet Take 300 mg by mouth daily before breakfast. (Patient not taking: Reported on 07/26/2014)  . [DISCONTINUED] cyclobenzaprine (FLEXERIL) 10 MG tablet Take 10 mg by mouth 3 (three) times daily as needed for muscle spasms.  . [DISCONTINUED] HYDROcodone-acetaminophen (NORCO/VICODIN) 5-325 MG per tablet Take 1  tablet by mouth every 6 (six) hours as needed for moderate pain.   No facility-administered encounter medications on file as of 09/04/2014.      Review of Systems  Constitutional: Negative.   Respiratory: Negative.   Cardiovascular: Negative.   Psychiatric/Behavioral: Negative.        Objective:   Physical Exam  Constitutional: He is oriented to person, place, and time. He appears well-developed and well-nourished.  Cardiovascular: Normal rate and regular rhythm.   Pulmonary/Chest: Effort normal.  Neurological: He is alert and  oriented to person, place, and time.          Assessment & Plan:  1. Hyperlipidemia associated with type 2 diabetes mellitus Lipids were at goal in February we'll plan on rechecking lipids and liver functions at next visit  2. Type 2 diabetes mellitus with other circulatory complications Last N9V was 8.5. He has been monitoring sugars at home and generally have been doing better. States that he cannot afford Invokana. Again stressed importance of weight loss and carbohydrate restriction - POCT glycosylated hemoglobin (Hb A1C)  Wardell Honour MD

## 2014-09-11 ENCOUNTER — Encounter: Payer: Self-pay | Admitting: Cardiology

## 2014-09-11 ENCOUNTER — Ambulatory Visit (INDEPENDENT_AMBULATORY_CARE_PROVIDER_SITE_OTHER): Payer: Medicare Other | Admitting: Cardiology

## 2014-09-11 VITALS — BP 130/78 | HR 72 | Ht 66.0 in | Wt 220.0 lb

## 2014-09-11 DIAGNOSIS — I10 Essential (primary) hypertension: Secondary | ICD-10-CM

## 2014-09-11 DIAGNOSIS — E782 Mixed hyperlipidemia: Secondary | ICD-10-CM

## 2014-09-11 DIAGNOSIS — I2510 Atherosclerotic heart disease of native coronary artery without angina pectoris: Secondary | ICD-10-CM | POA: Diagnosis not present

## 2014-09-11 NOTE — Progress Notes (Signed)
Cardiology Office Note  Date: 09/11/2014   ID: [name], DOB 01-Jun-1940, MRN [id number]  PCP: Wardell Honour, MD  Primary Cardiologist: Rozann Lesches, MD   Chief Complaint  Patient presents with  . Coronary Artery Disease  . Hypertension    History of Present Illness: [name] is a 74 y.o. male last seen in March. He presents for a routine follow-up visit. 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 He reports no angina symptoms with these activities and no nitroglycerin use.  He just had a recent follow-up visit with Dr. Sabra Heck. Lipids looked good, but he is still working on getting better glucose control.  We discussed his medications, he reports no intolerances with his cardiac regimen.  Cardiac catheterization from 2013 is outlined below. His follow-up ECG today is normal.   Past Medical History  Diagnosis Date  . Type 2 diabetes mellitus   . Anxiety disorder   . Arthritis   . Essential hypertension, benign   . Coronary atherosclerosis of native coronary artery     BMS nondominant RCA 12/2004  . Acute encephalopathy 08/2010  . Adenomatous polyp   . Gallbladder cholesterolosis   . Diverticulosis   . Internal hemorrhoids   . Nephrolithiasis   . Chronic headaches   . Myocardial infarction     2006/1 stent  . GERD (gastroesophageal reflux disease)   . Hyperlipidemia   . Shoulder pain, right     Past Surgical History  Procedure Laterality Date  . Total knee arthroplasty      Bil  . Rotator cuff repair      right  . Joint replacement      right hip  . Upper gastrointestinal endoscopy  01/08/2008    bx, inlet patch, duodenitis  . Colonoscopy  09/10/2008    normal  . Cataract extraction    . Cystoscopy w/ ureteroscopy w/ lithotripsy  08/2010  . Cholecystectomy    . Left heart catheterization with coronary angiogram N/A 05/14/2011    Procedure: LEFT HEART  CATHETERIZATION WITH CORONARY ANGIOGRAM;  Surgeon: Sherren Mocha, MD;  Location: [facility] CATH LAB;  Service: Cardiovascular;  Laterality: N/A;  . Yag laser application Right 7/94/8016    Procedure: YAG LASER APPLICATION;  Surgeon: Williams Che, MD;  Location: [facility] ORS;  Service: Ophthalmology;  Laterality: Right;    Current Outpatient Prescriptions  Medication Sig Dispense Refill  . AIMSCO INSULIN SYR ULTRA THIN 31G X 5/16" 0.3 ML MISC     . amLODipine (NORVASC) 5 MG tablet TAKE 1 TABLET DAILY 90 tablet 1  . aspirin 81 MG tablet Take 81 mg by mouth daily.    Marland Kitchen atorvastatin (LIPITOR) 20 MG tablet Take 1 tablet (20 mg total) by mouth daily at 6 PM. 90 tablet 1  . B-D ULTRAFINE III SHORT PEN 31G X 8 MM MISC USE 4 TIMES DAILY 100 each 2  . Blood Glucose Monitoring Suppl (ONE TOUCH ULTRA SYSTEM KIT) W/DEVICE KIT 1 kit by Does not apply route once. 1 each 0  . butalbital-acetaminophen-caffeine (FIORICET, ESGIC) 50-325-40 MG per tablet Take 1 tablet by mouth 2 (two) times daily as needed for headache.    . cetirizine (ZYRTEC) 10 MG tablet Take 10 mg by mouth daily.      . chlorthalidone (HYGROTON) 25 MG tablet Take 1 tablet (25 mg total) by mouth daily. as directed 90 tablet 3  . Cholecalciferol (VITAMIN D3) 2000 UNITS  TABS Take 1 tablet by mouth daily.      . famotidine (PEPCID) 20 MG tablet Take 1 tablet (20 mg total) by mouth 2 (two) times daily. 180 tablet 1  . Insulin Glargine (LANTUS SOLOSTAR) 100 UNIT/ML Solostar Pen INJECT 52 UNITS INTO THE SKIN AT BEDTIME 45 pen 2  . insulin lispro (HUMALOG) 100 UNIT/ML KiwkPen Inject 0-15 units into skin three times daily before meals. 70-150=8 units 151-200= 9 units 45 mL 3  . Insulin Pen Needle 31G X 8 MM MISC 4 Devices by Does not apply route 4 (four) times daily. 400 each 1  . metFORMIN (GLUCOPHAGE-XR) 500 MG 24 hr tablet TAKE 2 TABLETS (1,000 MG TOTAL) BY MOUTH 2 (TWO) TIMES DAILY. 360 tablet 1  . NITROSTAT 0.4 MG SL tablet USE AS DIRECTED AS NEEDED FOR  CHEST PAIN 25 each 1  . ONE TOUCH ULTRA TEST test strip     . PARoxetine (PAXIL) 20 MG tablet TAKE 1 TABLET (20 MG TOTAL) BY MOUTH DAILY. 90 tablet 1  . prazosin (MINIPRESS) 1 MG capsule Take 1 capsule (1 mg total) by mouth 2 (two) times daily. 180 capsule 1   No current facility-administered medications for this visit.    Allergies:  Ace inhibitors; Angiotensin receptor blockers; Ciprofloxacin; Doxycycline; Oxycodone-acetaminophen; Ramipril; Toradol; and Valium   Social History: The patient  reports that he quit smoking about 26 years ago. His smoking use included Cigarettes. He has a 25 pack-year smoking history. He quit smokeless tobacco use about 25 years ago. His smokeless tobacco use included Chew. He reports that he does not drink alcohol or use illicit drugs.   ROS:  Please see the history of present illness. Otherwise, complete review of systems is positive for arthritic pains, shoulder discomfort intermittently.  All other systems are reviewed and negative.   Physical Exam: VS:  BP 130/78 mmHg  Pulse 72  Ht '5\' 6"'  (1.676 m)  Wt 220 lb (99.791 kg)  BMI 35.53 kg/m2  SpO2 93%, BMI Body mass index is 35.53 kg/(m^2).  Wt Readings from Last 3 Encounters:  09/11/14 220 lb (99.791 kg)  09/04/14 220 lb 6.4 oz (99.973 kg)  05/30/14 219 lb (99.338 kg)     Overweight male, appears comfortable at rest.  HEENT: Conjunctiva and lids normal, oropharynx clear.  Neck: Supple, no elevated JVP or carotid bruits, no thyromegaly.  Lungs: Clear to auscultation, nonlabored breathing at rest.  Cardiac: Regular rate and rhythm, no S3 or significant systolic murmur, no pericardial rub.  Abdomen: Soft, nontender, bowel sounds present, no guarding or rebound.  Extremities: No pitting edema, distal pulses 2+.  Skin: Warm and dry.  Musculoskeletal: No kyphosis.  Neuropsychiatric: Alert and oriented x3, affect grossly appropriate.   ECG: ECG is ordered today.   Recent Labwork: 02/19/2014:  ALT 33; AST 25; BUN 17; Creatinine, Ser 1.03; Potassium 4.5; Sodium 141     Component Value Date/Time   CHOL 133 02/19/2014 0949   CHOL 133 04/10/2013 0937   CHOL 139 06/02/2012 1007   TRIG 145 02/19/2014 0949   TRIG 185* 04/10/2013 0937   TRIG 107 06/02/2012 1007   HDL 38* 02/19/2014 0949   HDL 34* 04/10/2013 0937   HDL 40 06/02/2012 1007   HDL 24* 05/14/2011 0550   CHOLHDL 3.5 02/19/2014 0949   CHOLHDL 6.2 05/14/2011 0550   VLDL UNABLE TO CALCULATE IF TRIGLYCERIDE OVER 400 mg/dL 05/14/2011 0550   LDLCALC 66 02/19/2014 0949   LDLCALC 62 04/10/2013 0937   LDLCALC  78 06/02/2012 1007   LDLCALC UNABLE TO CALCULATE IF TRIGLYCERIDE OVER 400 mg/dL 05/14/2011 0550    Other Studies Reviewed Today:  Cardiac catheterization May 2013 demonstrated nonobstructive disease with patent stent site in the RCA which was a small and severely diseased, nondominant vessel.   Assessment and Plan:  1. Symptomatically stable CAD, plan to continue current medical regimen and observation. I encouraged him to stay active, also work on weight loss and diet.  2. Hyperlipidemia, recent LDL 66 on Lipitor.  3. Essential hypertension, reasonable blood pressure control today.  4. Type 2 diabetes mellitus, continue to follow-up with Dr. Sabra Heck.  Current medicines were reviewed with the patient today.   Orders Placed This Encounter  Procedures  . EKG 12-Lead    Disposition: FU with me in 6  months.   Signed, Satira Sark, MD, [facility] 09/11/2014 8:43 AM    [facility] [place], [place], [place] [address] Phone: [phone]; Fax: [phone]

## 2014-09-11 NOTE — Patient Instructions (Signed)
Your physician recommends that you continue on your current medications as directed. Please refer to the Current Medication list given to you today. Your physician recommends that you schedule a follow-up appointment in: 6 months. You will receive a reminder letter in the mail in about 4 months reminding you to call and schedule your appointment. If you don't receive this letter, please contact our office. 

## 2014-10-30 ENCOUNTER — Other Ambulatory Visit: Payer: Self-pay | Admitting: Cardiology

## 2014-11-01 ENCOUNTER — Other Ambulatory Visit: Payer: Self-pay | Admitting: Family Medicine

## 2014-11-04 ENCOUNTER — Other Ambulatory Visit: Payer: Self-pay | Admitting: *Deleted

## 2014-11-04 MED ORDER — NITROGLYCERIN 0.4 MG SL SUBL: SUBLINGUAL_TABLET | SUBLINGUAL | Status: DC

## 2014-11-13 ENCOUNTER — Other Ambulatory Visit: Payer: Self-pay | Admitting: *Deleted

## 2014-11-13 MED ORDER — INSULIN PEN NEEDLE 31G X 8 MM MISC: Status: DC

## 2014-11-15 ENCOUNTER — Telehealth: Payer: Self-pay | Admitting: Family Medicine

## 2014-11-18 MED ORDER — INSULIN PEN NEEDLE 31G X 8 MM MISC
Status: DC
Start: 2014-11-18 — End: 2017-12-18

## 2014-11-18 MED ORDER — PRAZOSIN HCL 1 MG PO CAPS: 1.0000 mg | ORAL_CAPSULE | Freq: Two times a day (BID) | ORAL | Status: DC

## 2014-11-18 NOTE — Telephone Encounter (Signed)
Stp's wife and she is aware rx has been sent over to pharmacy.

## 2014-11-21 ENCOUNTER — Telehealth: Payer: Self-pay | Admitting: Family Medicine

## 2014-11-25 MED ORDER — METFORMIN HCL ER 500 MG PO TB24: ORAL_TABLET | ORAL | Status: DC

## 2014-11-25 MED ORDER — AMLODIPINE BESYLATE 5 MG PO TABS: ORAL_TABLET | ORAL | Status: DC

## 2014-11-25 MED ORDER — PRAZOSIN HCL 1 MG PO CAPS: 1.0000 mg | ORAL_CAPSULE | Freq: Two times a day (BID) | ORAL | Status: DC

## 2014-11-25 MED ORDER — ATORVASTATIN CALCIUM 20 MG PO TABS: 20.0000 mg | ORAL_TABLET | Freq: Every day | ORAL | Status: DC

## 2014-11-25 MED ORDER — INSULIN GLARGINE 100 UNIT/ML SOLOSTAR PEN: PEN_INJECTOR | SUBCUTANEOUS | Status: DC

## 2014-11-25 MED ORDER — FAMOTIDINE 20 MG PO TABS: 20.0000 mg | ORAL_TABLET | Freq: Two times a day (BID) | ORAL | Status: DC

## 2014-11-25 MED ORDER — PAROXETINE HCL 20 MG PO TABS: ORAL_TABLET | ORAL | Status: DC

## 2014-11-25 MED ORDER — INSULIN LISPRO 100 UNIT/ML (KWIKPEN): PEN_INJECTOR | SUBCUTANEOUS | Status: DC

## 2014-11-25 MED ORDER — CHLORTHALIDONE 25 MG PO TABS: 25.0000 mg | ORAL_TABLET | Freq: Every day | ORAL | Status: DC

## 2014-11-25 NOTE — Telephone Encounter (Signed)
done

## 2014-12-01 DIAGNOSIS — R112 Nausea with vomiting, unspecified: Secondary | ICD-10-CM | POA: Diagnosis not present

## 2014-12-01 DIAGNOSIS — E669 Obesity, unspecified: Secondary | ICD-10-CM | POA: Diagnosis not present

## 2014-12-01 DIAGNOSIS — G43909 Migraine, unspecified, not intractable, without status migrainosus: Secondary | ICD-10-CM | POA: Diagnosis not present

## 2014-12-01 DIAGNOSIS — F329 Major depressive disorder, single episode, unspecified: Secondary | ICD-10-CM | POA: Diagnosis not present

## 2014-12-01 DIAGNOSIS — Z794 Long term (current) use of insulin: Secondary | ICD-10-CM | POA: Diagnosis not present

## 2014-12-01 DIAGNOSIS — E7800 Pure hypercholesterolemia, unspecified: Secondary | ICD-10-CM | POA: Diagnosis not present

## 2014-12-01 DIAGNOSIS — I252 Old myocardial infarction: Secondary | ICD-10-CM | POA: Diagnosis not present

## 2014-12-01 DIAGNOSIS — Z7982 Long term (current) use of aspirin: Secondary | ICD-10-CM | POA: Diagnosis not present

## 2014-12-01 DIAGNOSIS — E119 Type 2 diabetes mellitus without complications: Secondary | ICD-10-CM | POA: Diagnosis not present

## 2014-12-01 DIAGNOSIS — Z79899 Other long term (current) drug therapy: Secondary | ICD-10-CM | POA: Diagnosis not present

## 2014-12-01 DIAGNOSIS — R51 Headache: Secondary | ICD-10-CM | POA: Diagnosis not present

## 2014-12-02 ENCOUNTER — Telehealth: Payer: Self-pay | Admitting: Family Medicine

## 2014-12-02 MED ORDER — BUTALBITAL-APAP-CAFFEINE 50-325-40 MG PO TABS: 1.0000 | ORAL_TABLET | Freq: Two times a day (BID) | ORAL | Status: DC | PRN

## 2014-12-02 NOTE — Telephone Encounter (Signed)
Pt's wife aware to come by and pick up rx.

## 2014-12-02 NOTE — Telephone Encounter (Signed)
Is okay to refill this prescription 1

## 2014-12-03 ENCOUNTER — Emergency Department (HOSPITAL_COMMUNITY)
Admission: EM | Admit: 2014-12-03 | Discharge: 2014-12-04 | Disposition: A | Discharge status: Home / Self Care | Payer: Medicare Other | Attending: Emergency Medicine | Admitting: Emergency Medicine

## 2014-12-03 DIAGNOSIS — Z794 Long term (current) use of insulin: Secondary | ICD-10-CM | POA: Diagnosis not present

## 2014-12-03 DIAGNOSIS — I10 Essential (primary) hypertension: Secondary | ICD-10-CM | POA: Insufficient documentation

## 2014-12-03 DIAGNOSIS — M1990 Unspecified osteoarthritis, unspecified site: Secondary | ICD-10-CM | POA: Insufficient documentation

## 2014-12-03 DIAGNOSIS — Z87891 Personal history of nicotine dependence: Secondary | ICD-10-CM | POA: Diagnosis not present

## 2014-12-03 DIAGNOSIS — Z862 Personal history of diseases of the blood and blood-forming organs and certain disorders involving the immune mechanism: Secondary | ICD-10-CM | POA: Insufficient documentation

## 2014-12-03 DIAGNOSIS — Z7982 Long term (current) use of aspirin: Secondary | ICD-10-CM | POA: Diagnosis not present

## 2014-12-03 DIAGNOSIS — G8929 Other chronic pain: Secondary | ICD-10-CM | POA: Insufficient documentation

## 2014-12-03 DIAGNOSIS — Z79899 Other long term (current) drug therapy: Secondary | ICD-10-CM | POA: Diagnosis not present

## 2014-12-03 DIAGNOSIS — E119 Type 2 diabetes mellitus without complications: Secondary | ICD-10-CM | POA: Diagnosis not present

## 2014-12-03 DIAGNOSIS — Z87442 Personal history of urinary calculi: Secondary | ICD-10-CM | POA: Diagnosis not present

## 2014-12-03 DIAGNOSIS — I252 Old myocardial infarction: Secondary | ICD-10-CM | POA: Insufficient documentation

## 2014-12-03 DIAGNOSIS — I2510 Atherosclerotic heart disease of native coronary artery without angina pectoris: Secondary | ICD-10-CM | POA: Insufficient documentation

## 2014-12-03 DIAGNOSIS — Z9889 Other specified postprocedural states: Secondary | ICD-10-CM | POA: Insufficient documentation

## 2014-12-03 DIAGNOSIS — Z8719 Personal history of other diseases of the digestive system: Secondary | ICD-10-CM | POA: Diagnosis not present

## 2014-12-03 DIAGNOSIS — G43009 Migraine without aura, not intractable, without status migrainosus: Secondary | ICD-10-CM | POA: Diagnosis not present

## 2014-12-03 DIAGNOSIS — R52 Pain, unspecified: Secondary | ICD-10-CM | POA: Diagnosis present

## 2014-12-03 DIAGNOSIS — E785 Hyperlipidemia, unspecified: Secondary | ICD-10-CM | POA: Insufficient documentation

## 2014-12-03 LAB — BASIC METABOLIC PANEL
ANION GAP: 11 (ref 5–15)
BUN: 22 mg/dL — ABNORMAL HIGH (ref 6–20)
CALCIUM: 8.8 mg/dL — AB (ref 8.9–10.3)
CO2: 27 mmol/L (ref 22–32)
Chloride: 96 mmol/L — ABNORMAL LOW (ref 101–111)
Creatinine, Ser: 0.95 mg/dL (ref 0.61–1.24)
Glucose, Bld: 176 mg/dL — ABNORMAL HIGH (ref 65–99)
Potassium: 3.2 mmol/L — ABNORMAL LOW (ref 3.5–5.1)
Sodium: 134 mmol/L — ABNORMAL LOW (ref 135–145)

## 2014-12-03 LAB — CBC WITH DIFFERENTIAL/PLATELET
BASOS ABS: 0 10*3/uL (ref 0.0–0.1)
BASOS PCT: 0 %
EOS PCT: 1 %
Eosinophils Absolute: 0.1 10*3/uL (ref 0.0–0.7)
HCT: 41.7 % (ref 39.0–52.0)
Hemoglobin: 14.4 g/dL (ref 13.0–17.0)
Lymphocytes Relative: 25 %
Lymphs Abs: 2.7 10*3/uL (ref 0.7–4.0)
MCH: 30.8 pg (ref 26.0–34.0)
MCHC: 34.5 g/dL (ref 30.0–36.0)
MCV: 89.1 fL (ref 78.0–100.0)
Monocytes Absolute: 0.9 10*3/uL (ref 0.1–1.0)
Monocytes Relative: 8 %
NEUTROS ABS: 7 10*3/uL (ref 1.7–7.7)
Neutrophils Relative %: 66 %
PLATELETS: 248 10*3/uL (ref 150–400)
RBC: 4.68 MIL/uL (ref 4.22–5.81)
RDW: 13.5 % (ref 11.5–15.5)
WBC: 10.7 10*3/uL — AB (ref 4.0–10.5)

## 2014-12-03 LAB — CBG MONITORING, ED
GLUCOSE-CAPILLARY: 175 mg/dL — AB (ref 65–99)
GLUCOSE-CAPILLARY: 199 mg/dL — AB (ref 65–99)

## 2014-12-03 MED ORDER — DEXAMETHASONE SODIUM PHOSPHATE 4 MG/ML IJ SOLN
10.0000 mg | Freq: Once | INTRAMUSCULAR | Status: AC
Start: 2014-12-03 — End: 2014-12-03
  Administered 2014-12-03: 10 mg via INTRAVENOUS
  Filled 2014-12-03: qty 3

## 2014-12-03 MED ORDER — METOCLOPRAMIDE HCL 10 MG PO TABS: 10.0000 mg | ORAL_TABLET | Freq: Three times a day (TID) | ORAL | Status: DC | PRN

## 2014-12-03 MED ORDER — SODIUM CHLORIDE 0.9 % IV BOLUS (SEPSIS)
1000.0000 mL | Freq: Once | INTRAVENOUS | Status: AC
  Administered 2014-12-03: 1000 mL via INTRAVENOUS

## 2014-12-03 MED ORDER — DIPHENHYDRAMINE HCL 50 MG/ML IJ SOLN
25.0000 mg | Freq: Once | INTRAMUSCULAR | Status: AC
Start: 2014-12-03 — End: 2014-12-03
  Administered 2014-12-03: 25 mg via INTRAVENOUS
  Filled 2014-12-03: qty 1

## 2014-12-03 MED ORDER — PROCHLORPERAZINE EDISYLATE 5 MG/ML IJ SOLN
10.0000 mg | Freq: Once | INTRAMUSCULAR | Status: AC
  Administered 2014-12-03: 10 mg via INTRAVENOUS
  Filled 2014-12-03: qty 2

## 2014-12-03 NOTE — ED Notes (Signed)
Pt reports headache since Sunday with emesis. Pt reports has taken otc and px medication with no relief. nad noted. Pt reports sounds sensitivity.

## 2014-12-03 NOTE — ED Notes (Signed)
Family reporting that pt is clammy and requesting blood sugar checked.

## 2014-12-03 NOTE — ED Provider Notes (Signed)
CSN: [id number]     Arrival date & time 12/03/14  1727 History   First MD Initiated Contact with Patient 12/03/14 2009     Chief Complaint  Patient presents with  . Headache     (Consider location/radiation/quality/duration/timing/severity/associated sxs/prior Treatment) HPI 74 year old male who presents with headache. History of chronic migraine headaches, type 2 diabetes, hypertension, and CAD. States that 3 days ago he developed generalized headache worse around bilateral temples, that is typical of his usual migraine headaches.  States that he noticed this _0 06/1 stent  . GERD (gastroesophageal reflux disease)   . Hyperlipidemia   . Shoulder pain, right    Past Surgical History  Procedure Laterality Date  . Total knee arthroplasty      Bil  . Rotator cuff repair      right  . Joint replacement      right hip  .  Upper gastrointestinal endoscopy  01/08/2008    bx, inlet patch, duodenitis  . Colonoscopy  09/10/2008    normal  . Cataract extraction    . Cystoscopy w/ ureteroscopy w/ lithotripsy  08/2010  . Cholecystectomy    . Left heart catheterization with coronary angiogram N/A 05/14/2011    Procedure: LEFT HEART CATHETERIZATION WITH CORONARY ANGIOGRAM;  Surgeon: Sherren Mocha, MD;  Location: [facility] CATH LAB;  Service: Cardiovascular;  Laterality: N/A;  . Yag laser application Right 2/45/8099    Procedure: YAG LASER APPLICATION;  Surgeon: Williams Che, MD;  Location: [facility];  Service: Ophthalmology;  Laterality: Right;   Family History  Problem Relation Age of Onset  . Colon cancer Mother     Diagnosed age 61  . Heart disease Father   . Breast cancer Sister   . Heart disease Brother   . Brain cancer Sister   . Heart disease Brother   . Bladder Cancer Brother   . Cancer - Other Brother    Social History  Substance Use Topics  . Smoking status: Former Smoker -- 1.00 packs/day for 25 years    Types: Cigarettes    Quit date: 08/11/1988  . Smokeless tobacco: Former Systems developer    Types: Chew    Quit date: 01/11/1989     Comment: chewed 1 pack tobacco/day for 15 years  . Alcohol Use: No     Comment: Very rarely    Review of Systems 10/14 systems reviewed and are negative other  than those stated in the HPI    Allergies  Ace inhibitors; Angiotensin receptor blockers; Ciprofloxacin; Other; Oxycodone-acetaminophen; Ramipril; Toradol; Valium; and Doxycycline  Home Medications   Prior to Admission medications   Medication Sig Start Date End Date Taking? Authorizing Provider  acetaminophen (TYLENOL) 650 MG CR tablet Take 650 mg by mouth every 8 (eight) hours as needed for pain.   Yes Historical Provider, MD  amLODipine (NORVASC) 5 MG tablet TAKE 1 TABLET DAILY Patient taking differently: Take 5 mg by mouth every morning.  11/25/14  Yes Wardell Honour, MD  aspirin 81 MG tablet Take 81 mg by  mouth every morning.    Yes Historical Provider, MD  atorvastatin (LIPITOR) 20 MG tablet Take 1 tablet (20 mg total) by mouth daily at 6 PM. 11/25/14  Yes Wardell Honour, MD  butalbital-acetaminophen-caffeine (FIORICET, ESGIC) [phone] MG tablet Take 1 tablet by mouth 2 (two) times daily as needed for headache. 12/02/14  Yes Chipper Herb, MD  cetirizine (ZYRTEC) 10 MG tablet Take 10 mg by mouth every evening.    Yes Historical Provider, MD  chlorthalidone (HYGROTON) 25 MG tablet Take 1 tablet (25 mg total) by mouth daily. as directed 11/25/14  Yes Wardell Honour, MD  Cholecalciferol (VITAMIN D3) 2000 UNITS TABS Take 1 tablet by mouth every morning.    Yes Historical Provider, MD  famotidine (PEPCID) 20 MG tablet Take 1 tablet (20 mg total) by mouth 2 (two) times daily. 11/25/14  Yes Wardell Honour, MD  Insulin Glargine (LANTUS SOLOSTAR) 100 UNIT/ML Solostar Pen INJECT 52 UNITS INTO THE SKIN AT BEDTIME 11/25/14  Yes Wardell Honour, MD  insulin lispro (HUMALOG) 100 UNIT/ML KiwkPen Inject 0-15 units into skin three times daily before meals. 70-150=8 units 151-200= 9 units 11/25/14  Yes Wardell Honour, MD  metFORMIN (GLUCOPHAGE-XR) 500 MG 24 hr tablet TAKE 2 TABLETS (1,000 MG TOTAL) BY MOUTH 2 (TWO) TIMES DAILY. Patient taking differently: Take 1,000 mg by mouth 2 (two) times daily.  11/25/14  Yes Wardell Honour, MD  nitroGLYCERIN (NITROSTAT) 0.4 MG SL tablet USE AS DIRECTED AS NEEDED FOR CHEST PAIN 11/04/14  Yes Satira Sark, MD  PARoxetine (PAXIL) 20 MG tablet TAKE 1 TABLET (20 MG TOTAL) BY MOUTH DAILY. 11/25/14  Yes Wardell Honour, MD  prazosin (MINIPRESS) 1 MG capsule Take 1 capsule (1 mg total) by mouth 2 (two) times daily. 11/25/14  Yes Wardell Honour, MD  AIMSCO INSULIN SYR ULTRA THIN 31G X 5/16" 0.3 ML MISC  10/29/13   Historical Provider, MD  Blood Glucose Monitoring Suppl (ONE TOUCH ULTRA SYSTEM KIT) W/DEVICE KIT 1 kit by Does not apply route once. 11/15/13   Wardell Honour, MD  Insulin Pen Needle (B-D ULTRAFINE III SHORT PEN) 31G X 8 MM MISC USE 4 TIMES DAILY DX E11.49 11/18/14   Wardell Honour, MD  metoCLOPramide (REGLAN) 10 MG tablet Take 1 tablet (10 mg total) by mouth every 8 (eight) hours as needed for nausea or vomiting. 12/03/14   Forde Dandy, MD  ONE TOUCH ULTRA TEST test strip  10/29/13   Historical Provider, MD   BP 142/72 mmHg  Pulse 70  Temp(Src) 98.2 F (36.8 C) (Oral)  Resp 16  Ht _0  (1.676 m)  Wt 210 lb (95.255 kg)  BMI 33.91 kg/m2  SpO2 99% Physical Exam Physical Exam  Nursing note and vitals reviewed. Constitutional: Well developed, well nourished, non-toxic, and in no acute distress Head:  Normocephalic and atraumatic.  Mouth/Throat: Oropharynx is clear and moist.  Neck: Normal range of motion. Neck supple. No meningismus. Cardiovascular: Normal rate and regular rhythm.   Pulmonary/Chest: Effort normal and breath sounds normal.  Abdominal: Soft. There is no tenderness. There is no rebound and no guarding.  Musculoskeletal: Normal range of motion.  Skin: Skin is warm and dry.  Psychiatric: Cooperative Neurological:  Alert, oriented to person, place, time, and situation. Memory grossly in tact. Fluent speech. No dysarthria or aphasia.  Cranial nerves: VF are full. Pupils are symmetric, and reactive to light. EOMI without nystagmus. No gaze deviation. Facial muscles symmetric with activation. Sensation to light touch over face in tact bilaterally. Hearing grossly in tact. Palate elevates symmetrically. Head turn and shoulder shrug are intact. Tongue midline.  Reflexes defered.  Muscle bulk and tone normal. No pronator drift. Moves all extremities symmetrically. Sensation to light touch is in tact throughout in bilateral upper and lower extremities. Coordination reveals no dysmetria with finger to nose. Gait is narrow-based and steady. Non-ataxic.  ED Course  Procedures (including critical care time) Labs Review Labs  Reviewed  CBC WITH DIFFERENTIAL/PLATELET - Abnormal; Notable for the following:    WBC 10.7 (*)    All other components within normal limits  BASIC METABOLIC PANEL - Abnormal; Notable for the following:    Sodium 134 (*)    Potassium 3.2 (*)    Chloride 96 (*)    Glucose, Bld 176 (*)    BUN 22 (*)    Calcium 8.8 (*)    All other components within normal limits  CBG MONITORING, ED - Abnormal; Notable for the following:    Glucose-Capillary 175 (*)    All other components within normal limits  CBG MONITORING, ED - Abnormal; Notable for the following:    Glucose-Capillary 199 (*)    All other components within normal limits  CBG MONITORING, ED    I have personally reviewed and evaluated these images and lab results as part of my medical decision-making.    MDM   Final diagnoses:  Migraine without aura and without status migrainosus, not intractable    In short this is 74 year old male with history of migraine headaches who presents with his typical migraine headache. He on presentation is nontoxic and in no acute distress vital signs are non-concerning. He is neurologically intact on exam, and there is no evidence of meningismus. No concerning features of his headache that would be suggestive of infection or intercranial bleeding. Given that he states this is typical of his headaches, I do not believe he requires any acute imaging at this time. He is unremarkable blood work, and is given a migraine cocktail. Including compazine, steroids, and IVF. Seen comfortably resting afterwards. Symptoms improved and states that he feels comfortable managing symptoms from home. Strict return and follow-up instructions reviewed. He expressed understanding of all discharge instructions and felt comfortable with the plan of care.     Forde Dandy, MD 12/05/14 [phone]

## 2014-12-03 NOTE — Discharge Instructions (Signed)
Migraine Headache  A migraine headache is very bad, throbbing pain on one or both sides of your head. Talk to your doctor about what things may bring on (trigger) your migraine headaches.  HOME CARE  · Only take medicines as told by your doctor.  · Lie down in a dark, quiet room when you have a migraine.  · Keep a journal to find out if certain things bring on migraine headaches. For example, write down:    What you eat and drink.    How much sleep you get.    Any change to your diet or medicines.  · Lessen how much alcohol you drink.  · Quit smoking if you smoke.  · Get enough sleep.  · Lessen any stress in your life.  · Keep lights dim if bright lights bother you or make your migraines worse.  GET HELP RIGHT AWAY IF:   · Your migraine becomes really bad.  · You have a fever.  · You have a stiff neck.  · You have trouble seeing.  · Your muscles are weak, or you lose muscle control.  · You lose your balance or have trouble walking.  · You feel like you will pass out (faint), or you pass out.  · You have really bad symptoms that are different than your first symptoms.  MAKE SURE YOU:   · Understand these instructions.  · Will watch your condition.  · Will get help right away if you are not doing well or get worse.     This information is not intended to replace advice given to you by your health care provider. Make sure you discuss any questions you have with your health care provider.     Document Released: 10/07/2007 Document Revised: 03/22/2011 Document Reviewed: 09/04/2012  [employer]vier Interactive Patient Education ©2016 [employer]

## 2014-12-06 ENCOUNTER — Emergency Department (HOSPITAL_COMMUNITY)
Admission: EM | Admit: 2014-12-06 | Discharge: 2014-12-06 | Disposition: A | Discharge status: Home / Self Care | Payer: Medicare Other | Attending: Emergency Medicine | Admitting: Emergency Medicine

## 2014-12-06 ENCOUNTER — Emergency Department (HOSPITAL_COMMUNITY): Payer: Medicare Other

## 2014-12-06 ENCOUNTER — Encounter (HOSPITAL_COMMUNITY): Payer: Self-pay | Admitting: Emergency Medicine

## 2014-12-06 DIAGNOSIS — I10 Essential (primary) hypertension: Secondary | ICD-10-CM | POA: Diagnosis not present

## 2014-12-06 DIAGNOSIS — Z87891 Personal history of nicotine dependence: Secondary | ICD-10-CM | POA: Diagnosis not present

## 2014-12-06 DIAGNOSIS — Z7984 Long term (current) use of oral hypoglycemic drugs: Secondary | ICD-10-CM | POA: Diagnosis not present

## 2014-12-06 DIAGNOSIS — K219 Gastro-esophageal reflux disease without esophagitis: Secondary | ICD-10-CM | POA: Diagnosis not present

## 2014-12-06 DIAGNOSIS — Z87442 Personal history of urinary calculi: Secondary | ICD-10-CM | POA: Diagnosis not present

## 2014-12-06 DIAGNOSIS — M1990 Unspecified osteoarthritis, unspecified site: Secondary | ICD-10-CM | POA: Insufficient documentation

## 2014-12-06 DIAGNOSIS — M545 Low back pain: Secondary | ICD-10-CM | POA: Diagnosis not present

## 2014-12-06 DIAGNOSIS — Y9289 Other specified places as the place of occurrence of the external cause: Secondary | ICD-10-CM | POA: Insufficient documentation

## 2014-12-06 DIAGNOSIS — G8929 Other chronic pain: Secondary | ICD-10-CM | POA: Insufficient documentation

## 2014-12-06 DIAGNOSIS — I2510 Atherosclerotic heart disease of native coronary artery without angina pectoris: Secondary | ICD-10-CM | POA: Diagnosis not present

## 2014-12-06 DIAGNOSIS — F419 Anxiety disorder, unspecified: Secondary | ICD-10-CM | POA: Insufficient documentation

## 2014-12-06 DIAGNOSIS — E785 Hyperlipidemia, unspecified: Secondary | ICD-10-CM | POA: Insufficient documentation

## 2014-12-06 DIAGNOSIS — Y998 Other external cause status: Secondary | ICD-10-CM | POA: Diagnosis not present

## 2014-12-06 DIAGNOSIS — S0990XA Unspecified injury of head, initial encounter: Secondary | ICD-10-CM | POA: Insufficient documentation

## 2014-12-06 DIAGNOSIS — E119 Type 2 diabetes mellitus without complications: Secondary | ICD-10-CM | POA: Insufficient documentation

## 2014-12-06 DIAGNOSIS — R51 Headache: Secondary | ICD-10-CM | POA: Diagnosis not present

## 2014-12-06 DIAGNOSIS — R519 Headache, unspecified: Secondary | ICD-10-CM

## 2014-12-06 DIAGNOSIS — Y9389 Activity, other specified: Secondary | ICD-10-CM | POA: Insufficient documentation

## 2014-12-06 DIAGNOSIS — Z7982 Long term (current) use of aspirin: Secondary | ICD-10-CM | POA: Diagnosis not present

## 2014-12-06 DIAGNOSIS — W108XXA Fall (on) (from) other stairs and steps, initial encounter: Secondary | ICD-10-CM | POA: Diagnosis not present

## 2014-12-06 DIAGNOSIS — Z794 Long term (current) use of insulin: Secondary | ICD-10-CM | POA: Diagnosis not present

## 2014-12-06 DIAGNOSIS — Z8669 Personal history of other diseases of the nervous system and sense organs: Secondary | ICD-10-CM | POA: Insufficient documentation

## 2014-12-06 DIAGNOSIS — I252 Old myocardial infarction: Secondary | ICD-10-CM | POA: Diagnosis not present

## 2014-12-06 DIAGNOSIS — G43909 Migraine, unspecified, not intractable, without status migrainosus: Secondary | ICD-10-CM | POA: Diagnosis not present

## 2014-12-06 DIAGNOSIS — W19XXXA Unspecified fall, initial encounter: Secondary | ICD-10-CM

## 2014-12-06 DIAGNOSIS — S199XXA Unspecified injury of neck, initial encounter: Secondary | ICD-10-CM | POA: Diagnosis not present

## 2014-12-06 LAB — CBC WITH DIFFERENTIAL/PLATELET
BASOS ABS: 0 10*3/uL (ref 0.0–0.1)
BASOS PCT: 0 %
Eosinophils Absolute: 0.1 10*3/uL (ref 0.0–0.7)
Eosinophils Relative: 1 %
HEMATOCRIT: 40.6 % (ref 39.0–52.0)
HEMOGLOBIN: 13.8 g/dL (ref 13.0–17.0)
Lymphocytes Relative: 25 %
Lymphs Abs: 2.1 10*3/uL (ref 0.7–4.0)
MCH: 30.1 pg (ref 26.0–34.0)
MCHC: 34 g/dL (ref 30.0–36.0)
MCV: 88.5 fL (ref 78.0–100.0)
Monocytes Absolute: 0.6 10*3/uL (ref 0.1–1.0)
Monocytes Relative: 7 %
NEUTROS ABS: 5.6 10*3/uL (ref 1.7–7.7)
NEUTROS PCT: 67 %
Platelets: 265 10*3/uL (ref 150–400)
RBC: 4.59 MIL/uL (ref 4.22–5.81)
RDW: 13.4 % (ref 11.5–15.5)
WBC: 8.5 10*3/uL (ref 4.0–10.5)

## 2014-12-06 LAB — COMPREHENSIVE METABOLIC PANEL
ALBUMIN: 3.4 g/dL — AB (ref 3.5–5.0)
ALK PHOS: 52 U/L (ref 38–126)
ALT: 28 U/L (ref 17–63)
AST: 38 U/L (ref 15–41)
Anion gap: 13 (ref 5–15)
BILIRUBIN TOTAL: 0.8 mg/dL (ref 0.3–1.2)
BUN: 19 mg/dL (ref 6–20)
CALCIUM: 8.8 mg/dL — AB (ref 8.9–10.3)
CO2: 25 mmol/L (ref 22–32)
Chloride: 96 mmol/L — ABNORMAL LOW (ref 101–111)
Creatinine, Ser: 0.99 mg/dL (ref 0.61–1.24)
GFR calc Af Amer: >60 mL/min (ref >60)
GFR calc non Af Amer: >60 mL/min (ref >60)
GLUCOSE: 231 mg/dL — AB (ref 65–99)
POTASSIUM: 3.1 mmol/L — AB (ref 3.5–5.1)
Sodium: 134 mmol/L — ABNORMAL LOW (ref 135–145)
TOTAL PROTEIN: 6.8 g/dL (ref 6.5–8.1)

## 2014-12-06 LAB — TROPONIN I: Troponin I: <0.03 ng/mL (ref <0.031)

## 2014-12-06 MED ORDER — LORAZEPAM 1 MG PO TABS
1.0000 mg | ORAL_TABLET | Freq: Once | ORAL | Status: AC
  Administered 2014-12-06: 1 mg via ORAL

## 2014-12-06 MED ORDER — LORAZEPAM 1 MG PO TABS
ORAL_TABLET | ORAL | Status: DC
Start: 2014-12-06 — End: 2014-12-07
  Filled 2014-12-06: qty 1

## 2014-12-06 NOTE — ED Provider Notes (Signed)
CSN: [id number]     Arrival date & time 12/06/14  1709 History   First MD Initiated Contact with Patient 12/06/14 1735     Chief Complaint  Patient presents with  . Fall  . Headache     (Consider location/radiation/quality/duration/timing/severity/associated sxs/prior Treatment) HPI Comments: Patient with history of diabetes, hypertension, CAD and chronic headaches presenting after fall today. He states he was walking down 4 steps when he missed the last step and fell forward after slipping. Denies hitting his head. States he fell on his outstretched arms. He denies any pain or injury from the fall. His wife states since the fall he's been dragging his left foot and having difficulty walking. He is not sure if his hit his head. Patient's had 2 ED visits this week for migraine headaches. No he's never been officially diagnosed with migraines. They state he gets a headache like this every 2 or 3 years. Headache is gradual in onset, bilateral, associated with nausea and had one episode of vomiting. No fever. No chest pain or shortness of breath. No photophobia or phonophobia. No vision or speech changes. No numbness. Patient has reportedly had a workup at an outside hospital that has been negative. He was seen here 2 days ago and felt better after treatment for migraine. Reports the headache soon recurred. He denies any pain in his leg. He denies any back pain or incontinence. He denies any neck pain.  Patient is a 74 y.o. male presenting with headaches. The history is provided by the patient and a relative.  Headache Associated symptoms: no abdominal pain, no congestion, no cough, no dizziness, no fever, no myalgias, no nausea, no neck pain, no neck stiffness, no photophobia and no vomiting     Past Medical History  Diagnosis Date  . Type 2 diabetes mellitus ([place])   . Anxiety disorder   . Arthritis   . Essential hypertension, benign   . Coronary atherosclerosis of native coronary artery      BMS nondominant RCA 12/2004  . Acute encephalopathy 08/2010  . Adenomatous polyp   . Gallbladder cholesterolosis   . Diverticulosis   . Internal hemorrhoids   . Nephrolithiasis   . Chronic headaches   . Myocardial infarction ([place])     2006/1 stent  . GERD (gastroesophageal reflux disease)   . Hyperlipidemia   . Shoulder pain, right   . Chronic headaches    Past Surgical History  Procedure Laterality Date  . Total knee arthroplasty      Bil  . Rotator cuff repair      right  . Joint replacement      right hip  . Upper gastrointestinal endoscopy  01/08/2008    bx, inlet patch, duodenitis  . Colonoscopy  09/10/2008    normal  . Cataract extraction    . Cystoscopy w/ ureteroscopy w/ lithotripsy  08/2010  . Cholecystectomy    . Left heart catheterization with coronary angiogram N/A 05/14/2011    Procedure: LEFT HEART CATHETERIZATION WITH CORONARY ANGIOGRAM;  Surgeon: Sherren Mocha, MD;  Location: [facility] CATH LAB;  Service: Cardiovascular;  Laterality: N/A;  . Yag laser application Right 2/35/3614    Procedure: YAG LASER APPLICATION;  Surgeon: Williams Che, MD;  Location: [facility] ORS;  Service: Ophthalmology;  Laterality: Right;   Family History  Problem Relation Age of Onset  . Colon cancer Mother     Diagnosed age 67  . Heart disease Father   . Breast cancer Sister   . Heart  disease Brother   . Brain cancer Sister   . Heart disease Brother   . Bladder Cancer Brother   . Cancer - Other Brother    Social History  Substance Use Topics  . Smoking status: Former Smoker -- 1.00 packs/day for 25 years    Types: Cigarettes    Quit date: 08/11/1988  . Smokeless tobacco: Former Systems developer    Types: Chew    Quit date: 01/11/1989     Comment: chewed 1 pack tobacco/day for 15 years  . Alcohol Use: No     Comment: Very rarely    Review of Systems  Constitutional: Negative for fever, activity change and appetite change.  HENT: Negative for congestion.   Eyes: Negative for photophobia.   Respiratory: Negative for cough, chest tightness and shortness of breath.   Cardiovascular: Negative for chest pain.  Gastrointestinal: Negative for nausea, vomiting and abdominal pain.  Genitourinary: Negative for dysuria and hematuria.  Musculoskeletal: Positive for gait problem. Negative for myalgias, arthralgias, neck pain and neck stiffness.  Skin: Negative for wound.  Neurological: Positive for headaches. Negative for dizziness and light-headedness.   A complete 10 system review of systems was obtained and all systems are negative except as noted in the HPI and PMH.     Allergies  Ace inhibitors; Angiotensin receptor blockers; Ciprofloxacin; Other; Oxycodone-acetaminophen; Ramipril; Toradol; Valium; and Doxycycline  Home Medications   Prior to Admission medications   Medication Sig Start Date End Date Taking? Authorizing Provider  amLODipine (NORVASC) 5 MG tablet TAKE 1 TABLET DAILY Patient taking differently: Take 5 mg by mouth every morning.  11/25/14  Yes Wardell Honour, MD  aspirin 81 MG tablet Take 81 mg by mouth every morning.    Yes Historical Provider, MD  atorvastatin (LIPITOR) 20 MG tablet Take 1 tablet (20 mg total) by mouth daily at 6 PM. 11/25/14  Yes Wardell Honour, MD  butalbital-acetaminophen-caffeine (FIORICET, ESGIC) [id number] MG tablet Take 1 tablet by mouth 2 (two) times daily as needed for headache. 12/02/14  Yes Chipper Herb, MD  cetirizine (ZYRTEC) 10 MG tablet Take 10 mg by mouth every evening.    Yes Historical Provider, MD  chlorthalidone (HYGROTON) 25 MG tablet Take 1 tablet (25 mg total) by mouth daily. as directed 11/25/14  Yes Wardell Honour, MD  Cholecalciferol (VITAMIN D3) 2000 UNITS TABS Take 1 tablet by mouth every morning.    Yes Historical Provider, MD  famotidine (PEPCID) 20 MG tablet Take 1 tablet (20 mg total) by mouth 2 (two) times daily. 11/25/14  Yes Wardell Honour, MD  Insulin Glargine (LANTUS SOLOSTAR) 100 UNIT/ML Solostar Pen  INJECT 52 UNITS INTO THE SKIN AT BEDTIME 11/25/14  Yes Wardell Honour, MD  insulin lispro (HUMALOG) 100 UNIT/ML KiwkPen Inject 0-15 units into skin three times daily before meals. 70-150=8 units 151-200= 9 units 11/25/14  Yes Wardell Honour, MD  metFORMIN (GLUCOPHAGE-XR) 500 MG 24 hr tablet TAKE 2 TABLETS (1,000 MG TOTAL) BY MOUTH 2 (TWO) TIMES DAILY. Patient taking differently: Take 1,000 mg by mouth 2 (two) times daily.  11/25/14  Yes Wardell Honour, MD  metoCLOPramide (REGLAN) 10 MG tablet Take 1 tablet (10 mg total) by mouth every 8 (eight) hours as needed for nausea or vomiting. 12/03/14  Yes Forde Dandy, MD  nitroGLYCERIN (NITROSTAT) 0.4 MG SL tablet USE AS DIRECTED AS NEEDED FOR CHEST PAIN 11/04/14  Yes Satira Sark, MD  PARoxetine (PAXIL) 20 MG tablet TAKE 1 TABLET (  20 MG TOTAL) BY MOUTH DAILY. 11/25/14  Yes Wardell Honour, MD  prazosin (MINIPRESS) 1 MG capsule Take 1 capsule (1 mg total) by mouth 2 (two) times daily. 11/25/14  Yes Wardell Honour, MD  acetaminophen (TYLENOL) 650 MG CR tablet Take 650 mg by mouth every 8 (eight) hours as needed for pain.    Historical Provider, MD  Blood Glucose Monitoring Suppl (ONE TOUCH ULTRA SYSTEM KIT) W/DEVICE KIT 1 kit by Does not apply route once. 11/15/13   Wardell Honour, MD  Insulin Pen Needle (B-D ULTRAFINE III SHORT PEN) 31G X 8 MM MISC USE 4 TIMES DAILY DX E11.49 11/18/14   Wardell Honour, MD  ONE TOUCH ULTRA TEST test strip  10/29/13   Historical Provider, MD   BP 128/60 mmHg  Pulse 61  Temp(Src) 98.1 F (36.7 C) (Oral)  Resp 18  Ht '5\' 6"'  (1.676 m)  Wt 210 lb (95.255 kg)  BMI 33.91 kg/m2  SpO2 93% Physical Exam  Constitutional: He is oriented to person, place, and time. He appears well-developed and well-nourished. No distress.  HENT:  Head: Normocephalic and atraumatic.  Mouth/Throat: Oropharynx is clear and moist. No oropharyngeal exudate.  Eyes: Conjunctivae and EOM are normal. Pupils are equal, round, and  reactive to light.  Neck: Normal range of motion. Neck supple.  No C spine tenderness  Cardiovascular: Normal rate, regular rhythm, normal heart sounds and intact distal pulses.   No murmur heard. Pulmonary/Chest: Effort normal and breath sounds normal. No respiratory distress.  Abdominal: Soft. There is no tenderness. There is no rebound and no guarding.  Musculoskeletal: Normal range of motion. He exhibits no edema or tenderness.  No T or L-spine tenderness, full range of motion of hips without pain. Pelvis stable.  Neurological: He is alert and oriented to person, place, and time. No cranial nerve deficit. He exhibits normal muscle tone. Coordination normal.  CN 2-12 intact, 5/5 strength throughout, intact DP and PT pulses. Able to lift legs off bed bilaterally. Ankle flexion and extension intact.   CN 2-12 intact, no ataxia on finger to nose, no nystagmus, 5/5 strength throughout, no pronator drift, Romberg negative, normal gait. Patient able to ambulate with normal gait. His wife agrees he is not dragging his leg at this time.  Skin: Skin is warm.  Psychiatric: He has a normal mood and affect. His behavior is normal.  Nursing note and vitals reviewed.   ED Course  Procedures (including critical care time) Labs Review Labs Reviewed  COMPREHENSIVE METABOLIC PANEL - Abnormal; Notable for the following:    Sodium 134 (*)    Potassium 3.1 (*)    Chloride 96 (*)    Glucose, Bld 231 (*)    Calcium 8.8 (*)    Albumin 3.4 (*)    All other components within normal limits  CBC WITH DIFFERENTIAL/PLATELET  TROPONIN I    Imaging Review Dg Lumbar Spine Complete  12/06/2014  CLINICAL DATA:  Low back pain.  Fall on steps this morning. EXAM: LUMBAR SPINE - COMPLETE 4+ VIEW COMPARISON:  None. FINDINGS: Vertebral body line and heights are normal. There is moderate spondylosis throughout the lumbar spine to include facet arthropathy over the lower lumbar spine. There is mild disc space  narrowing at the L4-5 level. There is no compression fracture or subluxation. Right hip arthroplasty is present. IMPRESSION: No acute findings. Moderate spondylosis of the lumbar spine with mild disc disease at the L4-5 level. Electronically Signed   By: Quillian Quince  Derrel Nip M.D.   On: 12/06/2014 18:51   Ct Head Wo Contrast  12/06/2014  CLINICAL DATA:  Fall down steps this morning. Evaluated for possible migraines 12/03/2014 with persistent headache. EXAM: CT HEAD WITHOUT CONTRAST CT CERVICAL SPINE WITHOUT CONTRAST TECHNIQUE: Multidetector CT imaging of the head and cervical spine was performed following the standard protocol without intravenous contrast. Multiplanar CT image reconstructions of the cervical spine were also generated. COMPARISON:  Head CT 12/01/2014 and 11/07/2010 FINDINGS: CT HEAD FINDINGS Ventricles, cisterns and other CSF spaces are within normal. There is mild chronic ischemic microvascular disease. There is no mass, mass effect, shift of midline structures or acute hemorrhage. No evidence of acute infarction. Remaining bones and soft tissues are within normal. CT CERVICAL SPINE FINDINGS Moderate motion artifact. Vertebral body alignment and heights are normal. There is mild spondylosis throughout the cervical spine. There is mild disc space narrowing at the C5-6 level. Prevertebral soft tissues are within normal. Atlantoaxial articulation is within normal. Moderate uncovertebral joint spurring and moderate to severe facet arthropathy. There is significant bilateral neural foraminal narrowing at multiple levels. No evidence of acute fracture or subluxation. Minimal calcified plaque at the carotid bifurcations bilaterally. Remainder of the exam is within normal IMPRESSION: No acute intracranial findings. Minimal chronic ischemic microvascular disease. No acute cervical spine injury. Mild to moderate spondylosis throughout the cervical spine with disc disease at C5-6 level. Significant bilateral  neural foraminal narrowing at multiple levels due to adjacent bony spurring. Electronically Signed   By: Marin Olp M.D.   On: 12/06/2014 19:10   Ct Cervical Spine Wo Contrast  12/06/2014  CLINICAL DATA:  Fall down steps this morning. Evaluated for possible migraines 12/03/2014 with persistent headache. EXAM: CT HEAD WITHOUT CONTRAST CT CERVICAL SPINE WITHOUT CONTRAST TECHNIQUE: Multidetector CT imaging of the head and cervical spine was performed following the standard protocol without intravenous contrast. Multiplanar CT image reconstructions of the cervical spine were also generated. COMPARISON:  Head CT 12/01/2014 and 11/07/2010 FINDINGS: CT HEAD FINDINGS Ventricles, cisterns and other CSF spaces are within normal. There is mild chronic ischemic microvascular disease. There is no mass, mass effect, shift of midline structures or acute hemorrhage. No evidence of acute infarction. Remaining bones and soft tissues are within normal. CT CERVICAL SPINE FINDINGS Moderate motion artifact. Vertebral body alignment and heights are normal. There is mild spondylosis throughout the cervical spine. There is mild disc space narrowing at the C5-6 level. Prevertebral soft tissues are within normal. Atlantoaxial articulation is within normal. Moderate uncovertebral joint spurring and moderate to severe facet arthropathy. There is significant bilateral neural foraminal narrowing at multiple levels. No evidence of acute fracture or subluxation. Minimal calcified plaque at the carotid bifurcations bilaterally. Remainder of the exam is within normal IMPRESSION: No acute intracranial findings. Minimal chronic ischemic microvascular disease. No acute cervical spine injury. Mild to moderate spondylosis throughout the cervical spine with disc disease at C5-6 level. Significant bilateral neural foraminal narrowing at multiple levels due to adjacent bony spurring. Electronically Signed   By: Marin Olp M.D.   On: 12/06/2014  19:10   Mr Brain Wo Contrast  12/06/2014  CLINICAL DATA:  Vertigo and fall today on steps without head injury. Headaches. EXAM: MRI HEAD WITHOUT CONTRAST TECHNIQUE: Multiplanar, multiecho pulse sequences of the brain and surrounding structures were obtained without intravenous contrast. COMPARISON:  Head CT 12/06/2014 FINDINGS: Mild motion artifact. Patient declined any attempts at repeat imaging. There is no evidence of acute infarct, intracranial hemorrhage, mass, midline shift, or  extra-axial fluid collection. There is mild generalized cerebral atrophy. Patchy T2 hyperintensities in the subcortical and deep cerebral white matter and pons are nonspecific but compatible with mild chronic small vessel ischemic disease. Prior right cataract extraction is noted. There is minimal right maxillary sinus mucosal thickening, and there is a small right mastoid effusion. Major intracranial vascular flow voids are preserved. IMPRESSION: 1. No acute intracranial abnormality. 2. Mild chronic small vessel ischemic disease and cerebral atrophy. Electronically Signed   By: Logan Bores M.D.   On: 12/06/2014 20:36   I have personally reviewed and evaluated these images and lab results as part of my medical decision-making.   EKG Interpretation   Date/Time:  Friday December 06 2014 18:06:26 EST Ventricular Rate:  65 PR Interval:  180 QRS Duration: 108 QT Interval:  412 QTC Calculation: 428 R Axis:   15 Text Interpretation:  Sinus rhythm No significant change was found  Confirmed by Wyvonnia Dusky  MD, Saide Lanuza [phone]) on 12/06/2014 6:10:49 PM      MDM   Final diagnoses:  Headache, unspecified headache type  Fall, initial encounter   Patient with history of recurrent headaches presenting after fall today that sounds mechanical when he missed the last step and lost his balance  And fell face forward. Did not hit his head. Since then he's been having worsening headache and dragging his left foot when he walks per  his wife. He denies any injury from the fall.   His neurological exam here is normal with normal gait.  CT head and C-spine are negative.  MRI is negative for acute infarct. Patient is ambulatory without difficulty. He is not dragging his leg. He declines any medication for his headache and states he is feeling better.  Patient will be given referral to neurology for his persistent headaches. There is no evidence of stroke, subarachnoid hemorrhage, meningitis or temporal arteritis. Return precautions discussed.   Ezequiel Essex, MD 12/07/14 [phone]

## 2014-12-06 NOTE — Discharge Instructions (Signed)
General Headache Without Cause Your testing shows no evidence of stroke. Follow up with the neurologist. Return to ED if you develop new or worsening symptoms. A headache is pain or discomfort felt around the head or neck area. The specific cause of a headache may not be found. There are many causes and types of headaches. A few common ones are:  Tension headaches.  Migraine headaches.  Cluster headaches.  Chronic daily headaches. HOME CARE INSTRUCTIONS  Watch your condition for any changes. Take these steps to help with your condition: Managing Pain  Take over-the-counter and prescription medicines only as told by your health care provider.  Lie down in a dark, quiet room when you have a headache.  If directed, apply ice to the head and neck area:  Put ice in a plastic bag.  Place a towel between your skin and the bag.  Leave the ice on for 20 minutes, 2-3 times per day.  Use a heating pad or hot shower to apply heat to the head and neck area as told by your health care provider.  Keep lights dim if bright lights bother you or make your headaches worse. Eating and Drinking  Eat meals on a regular schedule.  Limit alcohol use.  Decrease the amount of caffeine you drink, or stop drinking caffeine. General Instructions  Keep all follow-up visits as told by your health care provider. This is important.  Keep a headache journal to help find out what may trigger your headaches. For example, write down:  What you eat and drink.  How much sleep you get.  Any change to your diet or medicines.  Try massage or other relaxation techniques.  Limit stress.  Sit up straight, and do not tense your muscles.  Do not use tobacco products, including cigarettes, chewing tobacco, or e-cigarettes. If you need help quitting, ask your health care provider.  Exercise regularly as told by your health care provider.  Sleep on a regular schedule. Get 7-9 hours of sleep, or the amount  recommended by your health care provider. SEEK MEDICAL CARE IF:   Your symptoms are not helped by medicine.  You have a headache that is different from the usual headache.  You have nausea or you vomit.  You have a fever. SEEK IMMEDIATE MEDICAL CARE IF:   Your headache becomes severe.  You have repeated vomiting.  You have a stiff neck.  You have a loss of vision.  You have problems with speech.  You have pain in the eye or ear.  You have muscular weakness or loss of muscle control.  You lose your balance or have trouble walking.  You feel faint or pass out.  You have confusion.   This information is not intended to replace advice given to you by your health care provider. Make sure you discuss any questions you have with your health care provider.   Document Released: 12/28/2004 Document Revised: 09/18/2014 Document Reviewed: 04/22/2014 Elsevier Interactive Patient Education [employer].

## 2014-12-06 NOTE — ED Notes (Signed)
Pt reports that he fell down steps this am. He was almost at the bottom and slipped. Wife reported to EMS that he has been dragging left foot since the fall. Denies hitting head. Pt seen on 12/03/14 for migraines and reports head is still hurting

## 2014-12-06 NOTE — ED Notes (Signed)
Pt ambulated around nurse's station without difficulty. 

## 2014-12-10 ENCOUNTER — Ambulatory Visit (INDEPENDENT_AMBULATORY_CARE_PROVIDER_SITE_OTHER): Payer: Medicare Other | Admitting: Family Medicine

## 2014-12-10 ENCOUNTER — Encounter: Payer: Self-pay | Admitting: Family Medicine

## 2014-12-10 VITALS — BP 138/72 | HR 89 | Temp 96.8°F | Ht 66.0 in | Wt 216.4 lb

## 2014-12-10 DIAGNOSIS — R350 Frequency of micturition: Secondary | ICD-10-CM | POA: Diagnosis not present

## 2014-12-10 DIAGNOSIS — Z794 Long term (current) use of insulin: Secondary | ICD-10-CM | POA: Diagnosis not present

## 2014-12-10 DIAGNOSIS — E1159 Type 2 diabetes mellitus with other circulatory complications: Secondary | ICD-10-CM

## 2014-12-10 DIAGNOSIS — R51 Headache: Secondary | ICD-10-CM | POA: Diagnosis not present

## 2014-12-10 DIAGNOSIS — R519 Headache, unspecified: Secondary | ICD-10-CM

## 2014-12-10 LAB — POCT URINALYSIS DIPSTICK
BILIRUBIN UA: NEGATIVE
GLUCOSE UA: NEGATIVE
KETONES UA: NEGATIVE
Leukocytes, UA: NEGATIVE
NITRITE UA: NEGATIVE
PH UA: 5
Protein, UA: NEGATIVE
RBC UA: NEGATIVE
SPEC GRAV UA: 1.02
Urobilinogen, UA: NEGATIVE

## 2014-12-10 LAB — POCT UA - MICROSCOPIC ONLY
Bacteria, U Microscopic: NEGATIVE
CASTS, UR, LPF, POC: NEGATIVE
CRYSTALS, UR, HPF, POC: NEGATIVE
RBC, urine, microscopic: NEGATIVE
YEAST UA: NEGATIVE

## 2014-12-10 LAB — POCT GLYCOSYLATED HEMOGLOBIN (HGB A1C): Hemoglobin A1C: 7.6

## 2014-12-10 MED ORDER — TAMSULOSIN HCL 0.4 MG PO CAPS: 0.4000 mg | ORAL_CAPSULE | Freq: Every day | ORAL | Status: DC

## 2014-12-10 NOTE — Progress Notes (Signed)
Subjective:    Patient ID: [name], male    DOB: Oct 10, 1940, 74 y.o.   MRN: [id number]  HPI 74 year old gentleman whose had some increased frequency of headaches recently. Also had some neurologic symptoms that lasted briefly sounding like TIA. Had MRI and CT in the emergency room which were negative for stroke tumor etc. he is a diabetic and has had issues with control and dietary compliance. He also is having a problem now with urinary frequency and urgency as well as incontinence. There are no memory issues that might suggest normal pressure hydrocephalus.  Patient Active Problem List   Diagnosis Date Noted  . Hx of adenomatous colonic polyps 05/09/2014  . Obesity (BMI 30-39.9) 07/30/2013  . Type 2 diabetes mellitus with circulatory disorder ([place]) 05/27/2011  . Hyperlipidemia associated with type 2 diabetes mellitus ([place]) 10/19/2008  . Essential hypertension, benign 10/19/2008  . CORONARY ATHEROSCLEROSIS NATIVE CORONARY ARTERY 10/17/2008  . GERD (gastroesophageal reflux disease) 12/27/2007   Outpatient Encounter Prescriptions as of 12/10/2014  Medication Sig  . acetaminophen (TYLENOL) 650 MG CR tablet Take 650 mg by mouth every 8 (eight) hours as needed for pain.  Marland Kitchen amLODipine (NORVASC) 5 MG tablet TAKE 1 TABLET DAILY (Patient taking differently: Take 5 mg by mouth every morning. )  . aspirin 81 MG tablet Take 81 mg by mouth every morning.   Marland Kitchen atorvastatin (LIPITOR) 20 MG tablet Take 1 tablet (20 mg total) by mouth daily at 6 PM.  . Blood Glucose Monitoring Suppl (ONE TOUCH ULTRA SYSTEM KIT) W/DEVICE KIT 1 kit by Does not apply route once.  . butalbital-acetaminophen-caffeine (FIORICET, ESGIC) 50-325-40 MG tablet Take 1 tablet by mouth 2 (two) times daily as needed for headache.  . cetirizine (ZYRTEC) 10 MG tablet Take 10 mg by mouth every evening.   . chlorthalidone (HYGROTON) 25 MG tablet Take 1 tablet (25 mg total) by mouth daily. as directed  . Cholecalciferol (VITAMIN  D3) 2000 UNITS TABS Take 1 tablet by mouth every morning.   . famotidine (PEPCID) 20 MG tablet Take 1 tablet (20 mg total) by mouth 2 (two) times daily.  . Insulin Glargine (LANTUS SOLOSTAR) 100 UNIT/ML Solostar Pen INJECT 52 UNITS INTO THE SKIN AT BEDTIME  . insulin lispro (HUMALOG) 100 UNIT/ML KiwkPen Inject 0-15 units into skin three times daily before meals. 70-150=8 units 151-200= 9 units  . Insulin Pen Needle (B-D ULTRAFINE III SHORT PEN) 31G X 8 MM MISC USE 4 TIMES DAILY DX E11.49  . metFORMIN (GLUCOPHAGE-XR) 500 MG 24 hr tablet TAKE 2 TABLETS (1,000 MG TOTAL) BY MOUTH 2 (TWO) TIMES DAILY. (Patient taking differently: Take 1,000 mg by mouth 2 (two) times daily. )  . metoCLOPramide (REGLAN) 10 MG tablet Take 1 tablet (10 mg total) by mouth every 8 (eight) hours as needed for nausea or vomiting.  . nitroGLYCERIN (NITROSTAT) 0.4 MG SL tablet USE AS DIRECTED AS NEEDED FOR CHEST PAIN  . ONE TOUCH ULTRA TEST test strip   . PARoxetine (PAXIL) 20 MG tablet TAKE 1 TABLET (20 MG TOTAL) BY MOUTH DAILY.  . prazosin (MINIPRESS) 1 MG capsule Take 1 capsule (1 mg total) by mouth 2 (two) times daily.   No facility-administered encounter medications on file as of 12/10/2014.      Review of Systems  Constitutional: Negative.   Respiratory: Negative.   Cardiovascular: Negative.   Genitourinary: Positive for urgency and frequency.  Neurological: Positive for headaches.  Psychiatric/Behavioral: Negative.        Objective:  Physical Exam  Constitutional: He is oriented to person, place, and time. He appears well-developed and well-nourished.  Cardiovascular: Normal rate and regular rhythm.   Pulmonary/Chest: Effort normal and breath sounds normal.  Neurological: He is alert and oriented to person, place, and time. He has normal reflexes.  Psychiatric: He has a normal mood and affect. His behavior is normal.          Assessment & Plan:  1. Urinary frequency Prostate exam was normal as was  urinalysis. I think he may have some urge incontinence and frequency related to hyperglycemia - POCT UA - Microscopic Only - POCT urinalysis dipstick - PSA, total and free  2. Type 2 diabetes mellitus with other circulatory complication, with long-term current use of insulin (HCC) Last A1c was 7.93 months ago - POCT glycosylated hemoglobin (Hb A1C)  3. Frequent headaches With negative scans there is no evidence of tumor or structural lesion. Wife to try Excedrin Migraine rather than Fioricet; also appointment with neurology for consultation  Wardell Honour MD - Ambulatory referral to Neurology

## 2014-12-11 LAB — PSA, TOTAL AND FREE
PROSTATE SPECIFIC AG, SERUM: 0.9 ng/mL (ref 0.0–4.0)
PSA FREE PCT: 31.1 %
PSA FREE: 0.28 ng/mL

## 2014-12-13 ENCOUNTER — Telehealth: Payer: Self-pay | Admitting: Family Medicine

## 2014-12-13 NOTE — Telephone Encounter (Signed)
Stp's wife and she states he quit taking the flomax because it makes him feel run down. Pt wants to know if he needs to go back on the minipress? Please advise.

## 2014-12-13 NOTE — Telephone Encounter (Signed)
Why did patient stop the minipress and go on the flomax? If the minipress was working patient can restart? Pt may need to follow up with Dr. Sabra Heck or a urologists

## 2014-12-13 NOTE — Telephone Encounter (Signed)
Stp's wife and advised of provider feedback. Pt's wife voiced understanding.

## 2014-12-19 ENCOUNTER — Ambulatory Visit: Payer: Medicare Other | Admitting: Family Medicine

## 2014-12-23 ENCOUNTER — Encounter: Payer: Self-pay | Admitting: Neurology

## 2014-12-23 ENCOUNTER — Ambulatory Visit (INDEPENDENT_AMBULATORY_CARE_PROVIDER_SITE_OTHER): Payer: Medicare Other | Admitting: Neurology

## 2014-12-23 VITALS — BP 145/82 | HR 80 | Ht 66.0 in | Wt 219.0 lb

## 2014-12-23 DIAGNOSIS — G451 Carotid artery syndrome (hemispheric): Secondary | ICD-10-CM

## 2014-12-23 DIAGNOSIS — G43009 Migraine without aura, not intractable, without status migrainosus: Secondary | ICD-10-CM

## 2014-12-23 NOTE — Patient Instructions (Signed)

## 2014-12-23 NOTE — Progress Notes (Signed)
Reason for visit: Headache  Referring physician: Dr. Lonni Fix is a 74 y.o. male  History of present illness:  [name] is a 74 year old right-handed white male with a history of intermittent headaches. The patient has had headaches for greater than 20 years, he indicates that he may go about 3 years between episodes of headaches, when the headaches do come on, they tend to last 10 days or 2 weeks. The headaches generally will begin on the back of the head, involving the neck and shoulders. The headache may be associated with nausea and vomiting. He does not have photophobia or phonophobia, he denies any visual disturbances. He usually does not have any numbness or weakness with the face, arms, or legs. The patient last had an episode toward the end of November 2016, he went to the emergency room on 12/06/2014 because he developed some slight weakness of the left leg, he felt as if he was dragging the leg. MRI of the brain was done showing chronic stable small vessel disease. He does report some neck stiffness when the headache comes on. He indicates that Excedrin Migraine has helped the headache significantly. He has not had any further headaches at this time. He comes to this office for an evaluation. He denies any dizziness or vertigo with the headache. He denies any pain going down arms from the neck.  Past Medical History  Diagnosis Date  . Type 2 diabetes mellitus ([place])   . Anxiety disorder   . Arthritis   . Essential hypertension, benign   . Coronary atherosclerosis of native coronary artery     BMS nondominant RCA 12/2004  . Acute encephalopathy 08/2010  . Adenomatous polyp   . Gallbladder cholesterolosis   . Diverticulosis   . Internal hemorrhoids   . Nephrolithiasis   . Chronic headaches   . Myocardial infarction ([place])     2006/1 stent  . GERD (gastroesophageal reflux disease)   . Hyperlipidemia   . Shoulder pain, right   . Chronic headaches   .  Common migraine 12/23/2014    Past Surgical History  Procedure Laterality Date  . Total knee arthroplasty      Bil  . Rotator cuff repair      right  . Joint replacement      right hip  . Upper gastrointestinal endoscopy  01/08/2008    bx, inlet patch, duodenitis  . Colonoscopy  09/10/2008    normal  . Cataract extraction    . Cystoscopy w/ ureteroscopy w/ lithotripsy  08/2010  . Cholecystectomy    . Left heart catheterization with coronary angiogram N/A 05/14/2011    Procedure: LEFT HEART CATHETERIZATION WITH CORONARY ANGIOGRAM;  Surgeon: Sherren Mocha, MD;  Location: [facility] CATH LAB;  Service: Cardiovascular;  Laterality: N/A;  . Yag laser application Right 04/19/8117    Procedure: YAG LASER APPLICATION;  Surgeon: Williams Che, MD;  Location: [facility] ORS;  Service: Ophthalmology;  Laterality: Right;    Family History  Problem Relation Age of Onset  . Colon cancer Mother     Diagnosed age 69  . Heart disease Father   . Breast cancer Sister   . Heart disease Brother   . Brain cancer Sister   . Heart disease Brother   . Bladder Cancer Brother   . Cancer - Other Brother     Social history:  reports that he quit smoking about 26 years ago. His smoking use included Cigarettes. He has a 25  pack-year smoking history. He quit smokeless tobacco use about 25 years ago. His smokeless tobacco use included Chew. He reports that he drinks alcohol. He reports that he does not use illicit drugs.  Medications:  Prior to Admission medications   Medication Sig Start Date End Date Taking? Authorizing Provider  acetaminophen (TYLENOL) 650 MG CR tablet Take 650 mg by mouth every 8 (eight) hours as needed for pain.   Yes Historical Provider, MD  amLODipine (NORVASC) 5 MG tablet TAKE 1 TABLET DAILY Patient taking differently: Take 5 mg by mouth every morning.  11/25/14  Yes Wardell Honour, MD  aspirin 81 MG tablet Take 81 mg by mouth every morning.    Yes Historical Provider, MD  atorvastatin  (LIPITOR) 20 MG tablet Take 1 tablet (20 mg total) by mouth daily at 6 PM. 11/25/14  Yes Wardell Honour, MD  Blood Glucose Monitoring Suppl (ONE TOUCH ULTRA SYSTEM KIT) W/DEVICE KIT 1 kit by Does not apply route once. 11/15/13  Yes Wardell Honour, MD  butalbital-acetaminophen-caffeine (FIORICET, ESGIC) [phone] MG tablet Take 1 tablet by mouth 2 (two) times daily as needed for headache. 12/02/14  Yes Chipper Herb, MD  cetirizine (ZYRTEC) 10 MG tablet Take 10 mg by mouth every evening.    Yes Historical Provider, MD  chlorthalidone (HYGROTON) 25 MG tablet Take 1 tablet (25 mg total) by mouth daily. as directed 11/25/14  Yes Wardell Honour, MD  Cholecalciferol (VITAMIN D3) 2000 UNITS TABS Take 1 tablet by mouth every morning.    Yes Historical Provider, MD  famotidine (PEPCID) 20 MG tablet Take 1 tablet (20 mg total) by mouth 2 (two) times daily. 11/25/14  Yes Wardell Honour, MD  Insulin Glargine (LANTUS SOLOSTAR) 100 UNIT/ML Solostar Pen INJECT 52 UNITS INTO THE SKIN AT BEDTIME 11/25/14  Yes Wardell Honour, MD  insulin lispro (HUMALOG) 100 UNIT/ML KiwkPen Inject 0-15 units into skin three times daily before meals. 70-150=8 units 151-200= 9 units 11/25/14  Yes Wardell Honour, MD  Insulin Pen Needle (B-D ULTRAFINE III SHORT PEN) 31G X 8 MM MISC USE 4 TIMES DAILY DX E11.49 11/18/14  Yes Wardell Honour, MD  metFORMIN (GLUCOPHAGE-XR) 500 MG 24 hr tablet TAKE 2 TABLETS (1,000 MG TOTAL) BY MOUTH 2 (TWO) TIMES DAILY. Patient taking differently: Take 1,000 mg by mouth 2 (two) times daily.  11/25/14  Yes Wardell Honour, MD  metoCLOPramide (REGLAN) 10 MG tablet Take 1 tablet (10 mg total) by mouth every 8 (eight) hours as needed for nausea or vomiting. 12/03/14  Yes Forde Dandy, MD  nitroGLYCERIN (NITROSTAT) 0.4 MG SL tablet USE AS DIRECTED AS NEEDED FOR CHEST PAIN 11/04/14  Yes Satira Sark, MD  ONE TOUCH ULTRA TEST test strip  10/29/13  Yes Historical Provider, MD  PARoxetine (PAXIL) 20  MG tablet TAKE 1 TABLET (20 MG TOTAL) BY MOUTH DAILY. 11/25/14  Yes Wardell Honour, MD  tamsulosin (FLOMAX) 0.4 MG CAPS capsule Take 1 capsule (0.4 mg total) by mouth daily. 12/10/14  Yes Wardell Honour, MD      Allergies  Allergen Reactions  . Ace Inhibitors Hives, Swelling and Rash    Rash,hives,tongue swelling  . Angiotensin Receptor Blockers   . Ciprofloxacin Other (See Comments)    confusion  . Other Hives and Other (See Comments)    altaseptic  . Oxycodone-Acetaminophen Other (See Comments)    REACTION: unknown reaction  . Ramipril Cough  . Toradol [Ketorolac Tromethamine]  confusion  . Valium Other (See Comments)    Hallucinations; confusion  . Doxycycline Hives and Rash    ROS:  Out of a complete 14 system review of symptoms, the patient complains only of the following symptoms, and all other reviewed systems are negative.  Hearing loss, ringing in the ears Snoring Urination problems Easy bruising, easy bleeding Joint pain Anxiety, decreased energy Snoring  Blood pressure 145/82, pulse 80, height _0  (1.676 m), weight 219 lb (99.338 kg).  Physical Exam  General: The patient is alert and cooperative at the time of the examination.  Eyes: Pupils are equal, round, and reactive to light. Discs are flat bilaterally.  Neck: The neck is supple, no carotid bruits are noted.  Respiratory: The respiratory examination is clear.  Cardiovascular: The cardiovascular examination reveals a regular rate and rhythm, no obvious murmurs or rubs are noted.  Neuromuscular: Range of movement of the cervical spine lacks about 10 of full lateral rotation bilaterally.  Skin: Extremities are without significant edema.  Neurologic Exam  Mental status: The patient is alert and oriented x 3 at the time of the examination. The patient has apparent normal recent and remote memory, with an apparently normal attention span and concentration ability.  Cranial nerves: Facial  symmetry is present. There is good sensation of the face to pinprick and soft touch bilaterally. The strength of the facial muscles and the muscles to head turning and shoulder shrug are normal bilaterally. Speech is well enunciated, no aphasia or dysarthria is noted. Extraocular movements are full. Visual fields are full. The tongue is midline, and the patient has symmetric elevation of the soft palate. No obvious hearing deficits are noted.  Motor: The motor testing reveals 5 over 5 strength of all 4 extremities. Good symmetric motor tone is noted throughout.  Sensory: Sensory testing is intact to pinprick, soft touch, vibration sensation, and position sense on all 4 extremities. No stocking pattern pinprick sensory deficit was noted. No evidence of extinction is noted.  Coordination: Cerebellar testing reveals good finger-nose-finger and heel-to-shin bilaterally.  Gait and station: Gait is normal. Tandem gait is normal. Romberg is negative. No drift is seen.  Reflexes: Deep tendon reflexes are symmetric and normal bilaterally. Toes are downgoing bilaterally.   MRI brain 12/06/14:  IMPRESSION: 1. No acute intracranial abnormality. 2. Mild chronic small vessel ischemic disease and cerebral atrophy.  * MRI scan images were reviewed online. I agree with the written report.    Assessment/Plan:  1. Headache, possible migraine  2. Transient left leg weakness  The patient has had at least 2 episodes of transient left leg weakness lasting less than 10 minutes. He does have risk factors for stroke, including coronary artery disease and diabetes. The patient may be having migraine headaches, but cervicogenic headaches also need to be considered. The patient will be sent for carotid Doppler study. He is no longer having headaches, there is no indication for initiation of a prophylactic daily medication. He will follow-up through this office if needed.  Jill Alexanders MD 12/23/2014 7:28  PM  [facility] [address] [place], [place] [address]  Phone [phone] Fax [phone]

## 2015-01-08 ENCOUNTER — Ambulatory Visit (HOSPITAL_COMMUNITY)
Admission: RE | Admit: 2015-01-08 | Discharge: 2015-01-08 | Disposition: A | Discharge status: Home / Self Care | Payer: Medicare Other | Source: Ambulatory Visit | Attending: Neurology | Admitting: Neurology

## 2015-01-08 DIAGNOSIS — I2510 Atherosclerotic heart disease of native coronary artery without angina pectoris: Secondary | ICD-10-CM | POA: Insufficient documentation

## 2015-01-08 DIAGNOSIS — G43009 Migraine without aura, not intractable, without status migrainosus: Secondary | ICD-10-CM | POA: Diagnosis not present

## 2015-01-08 DIAGNOSIS — I6523 Occlusion and stenosis of bilateral carotid arteries: Secondary | ICD-10-CM | POA: Insufficient documentation

## 2015-01-08 DIAGNOSIS — E119 Type 2 diabetes mellitus without complications: Secondary | ICD-10-CM | POA: Insufficient documentation

## 2015-01-08 DIAGNOSIS — G451 Carotid artery syndrome (hemispheric): Secondary | ICD-10-CM | POA: Diagnosis not present

## 2015-01-08 DIAGNOSIS — E785 Hyperlipidemia, unspecified: Secondary | ICD-10-CM | POA: Diagnosis not present

## 2015-01-08 DIAGNOSIS — G459 Transient cerebral ischemic attack, unspecified: Secondary | ICD-10-CM | POA: Insufficient documentation

## 2015-01-08 NOTE — Progress Notes (Signed)
VASCULAR LAB PRELIMINARY  PRELIMINARY  PRELIMINARY  PRELIMINARY  Carotid duplex completed.    Preliminary report:  Bilateral:  1-39% ICA stenosis.  Vertebral artery flow is antegrade.     [name], RVS 01/08/2015, 10:35 AM

## 2015-01-12 ENCOUNTER — Telehealth: Payer: Self-pay | Admitting: Neurology

## 2015-01-12 NOTE — Telephone Encounter (Signed)
  I called patient. The carotid Doppler study is unremarkable, he is to contact me if any further TIA events are noted.  Carotid Doppler study 01/08/2015:  Summary:  - Mild technical difficulty due to high bifurcation near the jaw line. - Bilateral - 1% to 39% ICA stenosis in the bulb. Vertebral artery flow is antegrade.

## 2015-01-20 DIAGNOSIS — M25512 Pain in left shoulder: Secondary | ICD-10-CM | POA: Diagnosis not present

## 2015-01-20 DIAGNOSIS — S40012A Contusion of left shoulder, initial encounter: Secondary | ICD-10-CM | POA: Diagnosis not present

## 2015-01-31 ENCOUNTER — Encounter: Payer: Self-pay | Admitting: Internal Medicine

## 2015-02-11 DIAGNOSIS — M7541 Impingement syndrome of right shoulder: Secondary | ICD-10-CM | POA: Diagnosis not present

## 2015-02-14 DIAGNOSIS — S43422A Sprain of left rotator cuff capsule, initial encounter: Secondary | ICD-10-CM | POA: Diagnosis not present

## 2015-02-14 DIAGNOSIS — M25512 Pain in left shoulder: Secondary | ICD-10-CM | POA: Diagnosis not present

## 2015-02-18 ENCOUNTER — Telehealth: Payer: Self-pay | Admitting: Family Medicine

## 2015-02-18 DIAGNOSIS — M25512 Pain in left shoulder: Secondary | ICD-10-CM | POA: Diagnosis not present

## 2015-02-18 DIAGNOSIS — S43422A Sprain of left rotator cuff capsule, initial encounter: Secondary | ICD-10-CM | POA: Diagnosis not present

## 2015-02-18 NOTE — Telephone Encounter (Signed)
Scheduled

## 2015-02-20 DIAGNOSIS — M25512 Pain in left shoulder: Secondary | ICD-10-CM | POA: Diagnosis not present

## 2015-02-20 DIAGNOSIS — S43422A Sprain of left rotator cuff capsule, initial encounter: Secondary | ICD-10-CM | POA: Diagnosis not present

## 2015-02-24 DIAGNOSIS — M25512 Pain in left shoulder: Secondary | ICD-10-CM | POA: Diagnosis not present

## 2015-02-24 DIAGNOSIS — S43422A Sprain of left rotator cuff capsule, initial encounter: Secondary | ICD-10-CM | POA: Diagnosis not present

## 2015-02-26 DIAGNOSIS — S43422A Sprain of left rotator cuff capsule, initial encounter: Secondary | ICD-10-CM | POA: Diagnosis not present

## 2015-02-26 DIAGNOSIS — M25512 Pain in left shoulder: Secondary | ICD-10-CM | POA: Diagnosis not present

## 2015-03-03 DIAGNOSIS — M25511 Pain in right shoulder: Secondary | ICD-10-CM | POA: Diagnosis not present

## 2015-03-05 ENCOUNTER — Other Ambulatory Visit: Payer: Self-pay | Admitting: Family Medicine

## 2015-03-06 ENCOUNTER — Ambulatory Visit (INDEPENDENT_AMBULATORY_CARE_PROVIDER_SITE_OTHER): Payer: Medicare Other | Admitting: Cardiology

## 2015-03-06 ENCOUNTER — Encounter: Payer: Self-pay | Admitting: Cardiology

## 2015-03-06 VITALS — BP 137/82 | HR 67 | Ht 66.0 in | Wt 221.0 lb

## 2015-03-06 DIAGNOSIS — I10 Essential (primary) hypertension: Secondary | ICD-10-CM | POA: Diagnosis not present

## 2015-03-06 DIAGNOSIS — I2510 Atherosclerotic heart disease of native coronary artery without angina pectoris: Secondary | ICD-10-CM | POA: Diagnosis not present

## 2015-03-06 DIAGNOSIS — E782 Mixed hyperlipidemia: Secondary | ICD-10-CM | POA: Diagnosis not present

## 2015-03-06 DIAGNOSIS — I779 Disorder of arteries and arterioles, unspecified: Secondary | ICD-10-CM

## 2015-03-06 DIAGNOSIS — I739 Peripheral vascular disease, unspecified: Secondary | ICD-10-CM

## 2015-03-06 NOTE — Patient Instructions (Signed)
Your physician recommends that you continue on your current medications as directed. Please refer to the Current Medication list given to you today. Your physician recommends that you schedule a follow-up appointment in: 6 months. You will receive a reminder letter in the mail in about 4 months reminding you to call and schedule your appointment. If you don't receive this letter, please contact our office. 

## 2015-03-06 NOTE — Progress Notes (Signed)
Cardiology Office Note  Date: 03/06/2015   ID: [name], DOB 03-07-1940, MRN [id number]  PCP: Wardell Honour, MD  Primary Cardiologist: Rozann Lesches, MD   Chief Complaint  Patient presents with  . Coronary Artery Disease    History of Present Illness: [name] is a 75 y.o. male last seen in August 2016. He presents for a routine follow-up visit. From a cardiac perspective he does not report any angina symptoms or increasing nitroglycerin use. With most activities he reports NYHA class II dyspnea. He has had no palpitations or syncope.  We reviewed his medications which are outlined below. Cardiac regimen includes aspirin, Lipitor, chlorthalidone, sublingual nitroglycerin, and Norvasc. He reports no intolerances. Lipid panel from last year is reviewed below showing good LDL control. He will be having follow-up lab work with his PCP in March.  He does complain of left shoulder discomfort that is musculoskeletal following a fall. He is followed by an orthopedist and had a recent MRI for further evaluation, results pending.  We discussed his last cardiac catheterization from 2013, results noted below. At that time he had moderate nonobstructive disease and we have been managing him medically. We did discuss considering a follow-up Cardiolite study sometime within the next year.  I see that he has also had neurological evaluation for headaches/migraines. As part of his workup he had carotid Dopplers done in December 2016 which showed mild ICA disease.  Past Medical History  Diagnosis Date  . Type 2 diabetes mellitus ([place])   . Anxiety disorder   . Arthritis   . Essential hypertension, benign   . Coronary atherosclerosis of native coronary artery     BMS nondominant RCA 12/2004  . Acute encephalopathy 08/2010  . Adenomatous polyp   . Gallbladder cholesterolosis   . Diverticulosis   . Internal hemorrhoids   . Nephrolithiasis   . Chronic headaches   .  Myocardial infarction ([place])     2006/1 stent  . GERD (gastroesophageal reflux disease)   . Hyperlipidemia   . Shoulder pain, right   . Chronic headaches   . Common migraine 12/23/2014    Past Surgical History  Procedure Laterality Date  . Total knee arthroplasty      Bil  . Rotator cuff repair      right  . Joint replacement      right hip  . Upper gastrointestinal endoscopy  01/08/2008    bx, inlet patch, duodenitis  . Colonoscopy  09/10/2008    normal  . Cataract extraction    . Cystoscopy w/ ureteroscopy w/ lithotripsy  08/2010  . Cholecystectomy    . Left heart catheterization with coronary angiogram N/A 05/14/2011    Procedure: LEFT HEART CATHETERIZATION WITH CORONARY ANGIOGRAM;  Surgeon: Sherren Mocha, MD;  Location: [facility] CATH LAB;  Service: Cardiovascular;  Laterality: N/A;  . Yag laser application Right 08/05/2033    Procedure: YAG LASER APPLICATION;  Surgeon: Williams Che, MD;  Location: [facility] ORS;  Service: Ophthalmology;  Laterality: Right;    Current Outpatient Prescriptions  Medication Sig Dispense Refill  . acetaminophen (TYLENOL) 650 MG CR tablet Take 650 mg by mouth every 8 (eight) hours as needed for pain.    Marland Kitchen amLODipine (NORVASC) 5 MG tablet TAKE 1 TABLET DAILY (Patient taking differently: Take 5 mg by mouth every morning. ) 90 tablet 1  . aspirin 81 MG tablet Take 81 mg by mouth every morning.     Marland Kitchen atorvastatin (LIPITOR) 20 MG tablet  Take 1 tablet (20 mg total) by mouth daily at 6 PM. 90 tablet 1  . Blood Glucose Monitoring Suppl (ONE TOUCH ULTRA SYSTEM KIT) W/DEVICE KIT 1 kit by Does not apply route once. 1 each 0  . butalbital-acetaminophen-caffeine (FIORICET, ESGIC) 50-325-40 MG tablet Take 1 tablet by mouth 2 (two) times daily as needed for headache. 14 tablet 0  . cetirizine (ZYRTEC) 10 MG tablet Take 10 mg by mouth every evening.     . chlorthalidone (HYGROTON) 25 MG tablet Take 1 tablet (25 mg total) by mouth daily. as directed 90 tablet 1  .  Cholecalciferol (VITAMIN D3) 2000 UNITS TABS Take 1 tablet by mouth every morning.     . famotidine (PEPCID) 20 MG tablet Take 1 tablet (20 mg total) by mouth 2 (two) times daily. 180 tablet 1  . Insulin Glargine (LANTUS SOLOSTAR) 100 UNIT/ML Solostar Pen INJECT 52 UNITS INTO THE SKIN AT BEDTIME 60 mL 1  . insulin lispro (HUMALOG) 100 UNIT/ML KiwkPen Inject 0-15 units into skin three times daily before meals. 70-150=8 units 151-200= 9 units 45 mL 1  . Insulin Pen Needle (B-D ULTRAFINE III SHORT PEN) 31G X 8 MM MISC USE 4 TIMES DAILY DX E11.49 400 each 1  . metFORMIN (GLUCOPHAGE-XR) 500 MG 24 hr tablet TAKE 2 TABLETS (1,000 MG TOTAL) BY MOUTH 2 (TWO) TIMES DAILY. (Patient taking differently: Take 1,000 mg by mouth 2 (two) times daily. ) 360 tablet 1  . nitroGLYCERIN (NITROSTAT) 0.4 MG SL tablet USE AS DIRECTED AS NEEDED FOR CHEST PAIN 25 tablet 3  . ONE TOUCH ULTRA TEST test strip     . PARoxetine (PAXIL) 20 MG tablet Take 1 tablet by mouth  daily 90 tablet 0  . prazosin (MINIPRESS) 1 MG capsule Take 1 capsule by mouth 2 (two) times daily.    . tamsulosin (FLOMAX) 0.4 MG CAPS capsule Take 1 capsule (0.4 mg total) by mouth daily. 30 capsule 3   No current facility-administered medications for this visit.   Allergies:  Ace inhibitors; Angiotensin receptor blockers; Ciprofloxacin; Other; Oxycodone-acetaminophen; Ramipril; Toradol; Valium; and Doxycycline   Social History: The patient  reports that he quit smoking about 26 years ago. His smoking use included Cigarettes. He started smoking about 52 years ago. He has a 25 pack-year smoking history. He quit smokeless tobacco use about 26 years ago. His smokeless tobacco use included Chew. He reports that he drinks alcohol. He reports that he does not use illicit drugs.   ROS:  Please see the history of present illness. Otherwise, complete review of systems is positive for pain and decreased range of motion left shoulder after fall, work up by orthopedist  in process. No claudication. All other systems are reviewed and negative.   Physical Exam: VS:  BP 137/82 mmHg  Pulse 67  Ht '5\' 6"'  (1.676 m)  Wt 221 lb (100.245 kg)  BMI 35.69 kg/m2, BMI Body mass index is 35.69 kg/(m^2).  Wt Readings from Last 3 Encounters:  03/06/15 221 lb (100.245 kg)  12/23/14 219 lb (99.338 kg)  12/10/14 216 lb 6.4 oz (98.158 kg)    General: Overweight male, appears comfortable at rest. HEENT: Conjunctiva and lids normal, oropharynx clear. Neck: Supple, no elevated JVP or carotid bruits, no thyromegaly. Lungs: Clear to auscultation, nonlabored breathing at rest. Cardiac: Regular rate and rhythm, no S3 or significant systolic murmur, no pericardial rub. Abdomen: Protuberant, nontender, bowel sounds present, no guarding or rebound. Extremities: No pitting edema, distal pulses 2+. Skin:  Warm and dry. Musculoskeletal: No kyphosis. Discomfort with raising left arm over level of shoulder. Neuropsychiatric: Alert and oriented x3, affect grossly appropriate.  ECG: I personally reviewed the prior tracing from 12/06/2014 which showed sinus rhythm.  Recent Labwork: 12/06/2014: ALT 28; AST 38; BUN 19; Creatinine, Ser 0.99; Hemoglobin 13.8; Platelets 265; Potassium 3.1*; Sodium 134*  February 2016: Cholesterol 133, triglycerides 145, HDL 38, LDL 66  Other Studies Reviewed Today:  Cardiac catheterization 05/14/2011: Left mainstem: Patent with luminal irregularity noted.  Left anterior descending (LAD): The LAD is patent throughout. There is significant tortuosity in the mid vessel. There is diffuse mild to moderate stenosis in the midportion of the LAD without high-grade disease noted. The proximal LAD has a 50% stenosis at the second septal perforator. The mid LAD has a 50-60% stenosis with diffuse distal disease. The appearance of the LAD is not significantly changed from the previous study several years ago.  Left circumflex (LCx): The left circumflex is a large,  dominant vessel. There is a moderate caliber first OM that divided the twin branches. The inferior OM branch has a 70% stenosis. The second OM is widely patent. The left PDA and left posterolateral branches are patent without significant stenosis there  Right coronary artery (RCA): This is a small, nondominant vessel. The stent in the mid vessel is patent. There is diffuse stenosis in the distal portion of the stent and beyond with 80% disease in the segmental nature. There really are no branches supplying the LV.  Final Conclusions:  1. Nonobstructive coronary artery disease involving the left main, LAD, and left circumflex vessels. 2. Severe stenosis in a small, nondominant right coronary artery with continued patency of the stented segment.  Carotid Dopplers 01/09/2015: Summary:  - Mild technical difficulty due to high bifurcation near the jaw  line. - Bilateral - 1% to 39% ICA stenosis in the bulb. Vertebral artery  flow is antegrade.  Assessment and Plan:  1. Symptomatically stable CAD status post BMS to nondominant RCA in 2006. Cardiac catheterization in 2013 showed moderate nonobstructive disease and we have continued medical therapy. We will consider follow-up Cardiolite study sometime within the next year for ischemic surveillance. Continue present medical regimen.  2. Mild bilateral carotid artery disease by recent carotid Dopplers, newly documented. Continue aspirin and statin.  3. Hyperlipidemia, on Lipitor. LDL 66 last year, for follow-up lab work in March per PCP.  4. Essential hypertension, blood pressure is adequately controlled today on present regimen. No changes were made.  Current medicines were reviewed with the patient today.  Disposition: FU with me in 6 months.   Signed, Satira Sark, MD, [facility] 03/06/2015 8:25 AM    [facility] [place], [place], [place] [address] Phone: [phone]; Fax: [phone]

## 2015-03-13 ENCOUNTER — Ambulatory Visit (INDEPENDENT_AMBULATORY_CARE_PROVIDER_SITE_OTHER): Payer: Medicare Other | Admitting: Family Medicine

## 2015-03-13 ENCOUNTER — Encounter: Payer: Self-pay | Admitting: Family Medicine

## 2015-03-13 VITALS — BP 146/74 | HR 69 | Temp 97.0°F | Ht 66.0 in | Wt 218.0 lb

## 2015-03-13 DIAGNOSIS — Z794 Long term (current) use of insulin: Secondary | ICD-10-CM

## 2015-03-13 DIAGNOSIS — E1159 Type 2 diabetes mellitus with other circulatory complications: Secondary | ICD-10-CM | POA: Diagnosis not present

## 2015-03-13 DIAGNOSIS — I10 Essential (primary) hypertension: Secondary | ICD-10-CM | POA: Diagnosis not present

## 2015-03-13 DIAGNOSIS — E1169 Type 2 diabetes mellitus with other specified complication: Secondary | ICD-10-CM | POA: Diagnosis not present

## 2015-03-13 DIAGNOSIS — E785 Hyperlipidemia, unspecified: Secondary | ICD-10-CM | POA: Diagnosis not present

## 2015-03-13 LAB — POCT GLYCOSYLATED HEMOGLOBIN (HGB A1C): HEMOGLOBIN A1C: 8.1

## 2015-03-13 MED ORDER — PAROXETINE HCL 20 MG PO TABS: ORAL_TABLET | ORAL | Status: DC

## 2015-03-13 NOTE — Patient Instructions (Signed)
Medicare Annual Wellness Visit  [place] and the medical providers at [facility] strive to bring you the best medical care.  In doing so we not only want to address your current medical conditions and concerns but also to detect new conditions early and prevent illness, disease and health-related problems.    Medicare offers a yearly Wellness Visit which allows our clinical staff to assess your need for preventative services including immunizations, lifestyle education, counseling to decrease risk of preventable diseases and screening for fall risk and other medical concerns.    This visit is provided free of charge (no copay) for all Medicare recipients. The clinical pharmacists at [facility] have begun to conduct these Wellness Visits which will also include a thorough review of all your medications.    As you primary medical provider recommend that you make an appointment for your Annual Wellness Visit if you have not done so already this year.  You may set up this appointment before you leave today or you may call back ([phone]) and schedule an appointment.  Please make sure when you call that you mention that you are scheduling your Annual Wellness Visit with the clinical pharmacist so that the appointment may be made for the proper length of time.     Continue current medications. Continue good therapeutic lifestyle changes which include good diet and exercise. Fall precautions discussed with patient. If an FOBT was given today- please return it to our front desk. If you are over 50 years old - you may need Prevnar 13 or the adult Pneumonia vaccine.  **Flu shots are available--- please call and schedule a FLU-CLINIC appointment**  After your visit with us today you will receive a survey in the mail or online from [employer] regarding your care with us. Please take a moment to fill this out. Your feedback is very  important to us as you can help us better understand your patient needs as well as improve your experience and satisfaction. WE CARE ABOUT YOU!!!    

## 2015-03-13 NOTE — Progress Notes (Signed)
Subjective:    Patient ID: [name], male    DOB: July 29, 1940, 75 y.o.   MRN: [id number]  HPI Pt here for follow up and management of chronic medical problems which includes diabetes and hypertension. He is taking medications regularly. Since his last visit here he has seen cardiology and neurology. He had carotid study which was really unremarkable. Cardiologist consider doing a light stress test but has postponed that until next year. Patient denies any chest pain. He has had some tingling numbness in his left foot. Sugars continued to be elevated on a regimen of metformin long and short acting insulin. He is also seeing orthopedist for left shoulder pain. That appointment will be tomorrow.      Patient Active Problem List   Diagnosis Date Noted  . Common migraine 12/23/2014  . Hx of adenomatous colonic polyps 05/09/2014  . Obesity (BMI 30-39.9) 07/30/2013  . Type 2 diabetes mellitus with circulatory disorder ([place]) 05/27/2011  . Hyperlipidemia associated with type 2 diabetes mellitus ([place]) 10/19/2008  . Essential hypertension, benign 10/19/2008  . CORONARY ATHEROSCLEROSIS NATIVE CORONARY ARTERY 10/17/2008  . GERD (gastroesophageal reflux disease) 12/27/2007   Outpatient Encounter Prescriptions as of 03/13/2015  Medication Sig  . acetaminophen (TYLENOL) 650 MG CR tablet Take 650 mg by mouth every 8 (eight) hours as needed for pain.  Marland Kitchen amLODipine (NORVASC) 5 MG tablet TAKE 1 TABLET DAILY (Patient taking differently: Take 5 mg by mouth every morning. )  . aspirin 81 MG tablet Take 81 mg by mouth every morning.   Marland Kitchen atorvastatin (LIPITOR) 20 MG tablet Take 1 tablet (20 mg total) by mouth daily at 6 PM.  . Blood Glucose Monitoring Suppl (ONE TOUCH ULTRA SYSTEM KIT) W/DEVICE KIT 1 kit by Does not apply route once.  . butalbital-acetaminophen-caffeine (FIORICET, ESGIC) 50-325-40 MG tablet Take 1 tablet by mouth 2 (two) times daily as needed for headache.  . cetirizine (ZYRTEC) 10 MG  tablet Take 10 mg by mouth every evening.   . chlorthalidone (HYGROTON) 25 MG tablet Take 1 tablet (25 mg total) by mouth daily. as directed  . Cholecalciferol (VITAMIN D3) 2000 UNITS TABS Take 1 tablet by mouth every morning.   . famotidine (PEPCID) 20 MG tablet Take 1 tablet (20 mg total) by mouth 2 (two) times daily.  . Insulin Glargine (LANTUS SOLOSTAR) 100 UNIT/ML Solostar Pen INJECT 52 UNITS INTO THE SKIN AT BEDTIME  . insulin lispro (HUMALOG) 100 UNIT/ML KiwkPen Inject 0-15 units into skin three times daily before meals. 70-150=8 units 151-200= 9 units  . Insulin Pen Needle (B-D ULTRAFINE III SHORT PEN) 31G X 8 MM MISC USE 4 TIMES DAILY DX E11.49  . metFORMIN (GLUCOPHAGE-XR) 500 MG 24 hr tablet TAKE 2 TABLETS (1,000 MG TOTAL) BY MOUTH 2 (TWO) TIMES DAILY. (Patient taking differently: Take 1,000 mg by mouth 2 (two) times daily. )  . nitroGLYCERIN (NITROSTAT) 0.4 MG SL tablet USE AS DIRECTED AS NEEDED FOR CHEST PAIN  . ONE TOUCH ULTRA TEST test strip   . PARoxetine (PAXIL) 20 MG tablet Take 1 tablet by mouth  daily  . prazosin (MINIPRESS) 1 MG capsule Take 1 capsule by mouth 2 (two) times daily.  . tamsulosin (FLOMAX) 0.4 MG CAPS capsule Take 1 capsule (0.4 mg total) by mouth daily.  . [DISCONTINUED] PARoxetine (PAXIL) 20 MG tablet Take 1 tablet by mouth  daily   No facility-administered encounter medications on file as of 03/13/2015.      Review of Systems  Constitutional:  Negative.   HENT: Negative.   Eyes: Negative.   Respiratory: Negative.   Cardiovascular: Negative.   Gastrointestinal: Negative.   Endocrine: Negative.   Genitourinary: Negative.   Musculoskeletal: Positive for arthralgias (left shoulder).  Skin: Negative.   Allergic/Immunologic: Negative.   Neurological: Negative.   Hematological: Negative.   Psychiatric/Behavioral: Negative.        Objective:   Physical Exam  Constitutional: He is oriented to person, place, and time. He appears well-developed and  well-nourished.  Cardiovascular: Normal rate, regular rhythm, normal heart sounds and intact distal pulses.   Pulmonary/Chest: Effort normal and breath sounds normal.  Neurological: He is alert and oriented to person, place, and time.  Psychiatric: He has a normal mood and affect. His behavior is normal.    BP 146/74 mmHg  Pulse 69  Temp(Src) 97 F (36.1 C) (Oral)  Ht _0  (1.676 m)  Wt 218 lb (98.884 kg)  BMI 35.20 kg/m2       Assessment & Plan:  1. Type 2 diabetes mellitus with other circulatory complication, with long-term current use of insulin (HCC) C was 7.8. With dietary compliance or lack of will expect similar reading today - POCT glycosylated hemoglobin (Hb A1C)  2. Hyperlipidemia associated with type 2 diabetes mellitus ([place]) , Especially LDL, was good 1 year ago. Need to update that with liver functions - Lipid panel  3. Essential hypertension, benign Blood pressure at 146/74 is acceptable.  Wardell Honour MD - [phone]

## 2015-03-14 DIAGNOSIS — M19012 Primary osteoarthritis, left shoulder: Secondary | ICD-10-CM | POA: Diagnosis not present

## 2015-03-14 DIAGNOSIS — S46012A Strain of muscle(s) and tendon(s) of the rotator cuff of left shoulder, initial encounter: Secondary | ICD-10-CM | POA: Diagnosis not present

## 2015-03-14 LAB — CMP14+EGFR
A/G RATIO: 1.4 (ref 1.1–2.5)
ALBUMIN: 4.2 g/dL (ref 3.5–4.8)
ALT: 28 IU/L (ref 0–44)
AST: 19 IU/L (ref 0–40)
Alkaline Phosphatase: 64 IU/L (ref 39–117)
BUN / CREAT RATIO: 14 (ref 10–22)
BUN: 15 mg/dL (ref 8–27)
Bilirubin Total: 0.6 mg/dL (ref 0.0–1.2)
CALCIUM: 9.9 mg/dL (ref 8.6–10.2)
CO2: 28 mmol/L (ref 18–29)
CREATININE: 1.05 mg/dL (ref 0.76–1.27)
Chloride: 97 mmol/L (ref 96–106)
GFR calc Af Amer: 80 mL/min/{1.73_m2} (ref >59)
GFR, EST NON AFRICAN AMERICAN: 69 mL/min/{1.73_m2} (ref >59)
GLOBULIN, TOTAL: 3 g/dL (ref 1.5–4.5)
Glucose: 208 mg/dL — ABNORMAL HIGH (ref 65–99)
POTASSIUM: 5.1 mmol/L (ref 3.5–5.2)
SODIUM: 141 mmol/L (ref 134–144)
Total Protein: 7.2 g/dL (ref 6.0–8.5)

## 2015-03-14 LAB — LIPID PANEL
CHOL/HDL RATIO: 3.8 ratio (ref 0.0–5.0)
Cholesterol, Total: 132 mg/dL (ref 100–199)
HDL: 35 mg/dL — ABNORMAL LOW (ref >39)
LDL CALC: 62 mg/dL (ref 0–99)
Triglycerides: 174 mg/dL — ABNORMAL HIGH (ref 0–149)
VLDL Cholesterol Cal: 35 mg/dL (ref 5–40)

## 2015-03-26 ENCOUNTER — Encounter: Payer: Self-pay | Admitting: *Deleted

## 2015-04-11 DIAGNOSIS — M7541 Impingement syndrome of right shoulder: Secondary | ICD-10-CM | POA: Diagnosis not present

## 2015-04-11 DIAGNOSIS — M19012 Primary osteoarthritis, left shoulder: Secondary | ICD-10-CM | POA: Diagnosis not present

## 2015-04-11 DIAGNOSIS — S46012D Strain of muscle(s) and tendon(s) of the rotator cuff of left shoulder, subsequent encounter: Secondary | ICD-10-CM | POA: Diagnosis not present

## 2015-04-30 ENCOUNTER — Telehealth: Payer: Self-pay | Admitting: Family Medicine

## 2015-04-30 ENCOUNTER — Other Ambulatory Visit: Payer: Self-pay | Admitting: Family Medicine

## 2015-04-30 MED ORDER — GLUCOSE BLOOD VI STRP: ORAL_STRIP | Status: DC

## 2015-04-30 NOTE — Telephone Encounter (Signed)
done

## 2015-05-23 DIAGNOSIS — M19012 Primary osteoarthritis, left shoulder: Secondary | ICD-10-CM | POA: Diagnosis not present

## 2015-05-23 DIAGNOSIS — S46012D Strain of muscle(s) and tendon(s) of the rotator cuff of left shoulder, subsequent encounter: Secondary | ICD-10-CM | POA: Diagnosis not present

## 2015-07-17 ENCOUNTER — Encounter: Payer: Self-pay | Admitting: Family Medicine

## 2015-07-17 ENCOUNTER — Ambulatory Visit (INDEPENDENT_AMBULATORY_CARE_PROVIDER_SITE_OTHER): Payer: Medicare Other | Admitting: Family Medicine

## 2015-07-17 VITALS — BP 140/76 | HR 67 | Temp 97.4°F | Ht 66.0 in | Wt 220.2 lb

## 2015-07-17 DIAGNOSIS — E1169 Type 2 diabetes mellitus with other specified complication: Secondary | ICD-10-CM

## 2015-07-17 DIAGNOSIS — I10 Essential (primary) hypertension: Secondary | ICD-10-CM | POA: Diagnosis not present

## 2015-07-17 DIAGNOSIS — Z794 Long term (current) use of insulin: Secondary | ICD-10-CM | POA: Diagnosis not present

## 2015-07-17 DIAGNOSIS — E785 Hyperlipidemia, unspecified: Secondary | ICD-10-CM

## 2015-07-17 DIAGNOSIS — E1159 Type 2 diabetes mellitus with other circulatory complications: Secondary | ICD-10-CM

## 2015-07-17 DIAGNOSIS — L570 Actinic keratosis: Secondary | ICD-10-CM

## 2015-07-17 LAB — BAYER DCA HB A1C WAIVED: HB A1C (BAYER DCA - WAIVED): 8 % — ABNORMAL HIGH (ref <7.0)

## 2015-07-17 MED ORDER — LOSARTAN POTASSIUM 50 MG PO TABS: 50.0000 mg | ORAL_TABLET | Freq: Every day | ORAL | Status: DC

## 2015-07-17 NOTE — Progress Notes (Signed)
Subjective:    Patient ID: [name], male    DOB: 03/20/1940, 75 y.o.   MRN: [id number]  HPI [name] is here to follow-up diabetes lipids and blood pressure. As usual he does not do as well with his diet. Last A1c was 8.1 and it is 8.0 today. He complains of memory issues. 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 He remembered 2 of 3 words at 5 minutes today so there is some loss of memory but I think for his age not abnormal. He has several keratosis on his skin left ear year left hand and on the legs that he would like frozen today  Patient Active Problem List   Diagnosis Date Noted  . Common migraine 12/23/2014  . Hx of adenomatous colonic polyps 05/09/2014  . Obesity (BMI 30-39.9) 07/30/2013  . Type 2 diabetes mellitus with circulatory disorder ([place]) 05/27/2011  . Hyperlipidemia associated with type 2 diabetes mellitus ([place]) 10/19/2008  . Essential hypertension, benign 10/19/2008  . CORONARY ATHEROSCLEROSIS NATIVE CORONARY ARTERY 10/17/2008  . GERD (gastroesophageal reflux disease) 12/27/2007   Outpatient Encounter Prescriptions as of 07/17/2015  Medication Sig  . acetaminophen (TYLENOL) 650 MG CR tablet Take 650 mg by mouth every 8 (eight) hours as needed for pain.  Marland Kitchen amLODipine (NORVASC) 5 MG tablet Take 1 tablet by mouth  daily  . aspirin 81 MG tablet Take 81 mg by mouth every morning.   Marland Kitchen atorvastatin (LIPITOR) 20 MG tablet TAKE 1 TABLET BY MOUTH  DAILY AT 6 PM.  . Blood Glucose Monitoring Suppl (ONE TOUCH ULTRA SYSTEM KIT) W/DEVICE KIT 1 kit by Does not apply route once.  . butalbital-acetaminophen-caffeine (FIORICET, ESGIC) 50-325-40 MG tablet Take 1 tablet by mouth 2 (two) times daily as needed for headache.  . cetirizine (ZYRTEC) 10 MG tablet Take 10 mg by mouth every evening.   . chlorthalidone (HYGROTON) 25 MG tablet Take 1 tablet by mouth  daily as directed  . Cholecalciferol (VITAMIN D3) 2000 UNITS TABS Take 1 tablet by mouth every morning.    . famotidine (PEPCID) 20 MG tablet Take 1 tablet (20 mg total) by mouth 2 (two) times daily.  Marland Kitchen glucose blood (ONE TOUCH ULTRA TEST) test strip One Touch ultra blue. Test qid. Dx E11.59  . insulin lispro (HUMALOG) 100 UNIT/ML KiwkPen Inject 0-15 units into skin three times daily before meals. 70-150=8 units 151-200= 9 units  . Insulin Pen Needle (B-D ULTRAFINE III SHORT PEN) 31G X 8 MM MISC USE 4 TIMES DAILY DX E11.49  . LANTUS SOLOSTAR 100 UNIT/ML Solostar Pen Inject subcutaneously 52  units at bedtime  . metFORMIN (GLUCOPHAGE-XR) 500 MG 24 hr tablet Take 2 tablets by mouth two times daily  . nitroGLYCERIN (NITROSTAT) 0.4 MG SL tablet USE AS DIRECTED AS NEEDED FOR CHEST PAIN  . ONE TOUCH ULTRA TEST test strip   . PARoxetine (PAXIL) 20 MG tablet Take 1 tablet by mouth  daily  . prazosin (MINIPRESS) 1 MG capsule Take 1 capsule by mouth 2 (two) times daily.  . prazosin (MINIPRESS) 1 MG capsule Take 1 capsule by mouth two times daily  . tamsulosin (FLOMAX) 0.4 MG CAPS capsule Take 1 capsule (0.4 mg total) by mouth daily.   No facility-administered encounter medications on file as of 07/17/2015.      Review of Systems  Constitutional: Negative.   Respiratory: Negative.   Cardiovascular: Negative.   Gastrointestinal: Negative.   Musculoskeletal: Negative.   Psychiatric/Behavioral:  Negative.        Objective:   Physical Exam  Constitutional: He is oriented to person, place, and time. He appears well-developed and well-nourished.  Cardiovascular: Normal rate, regular rhythm and normal heart sounds.   Pulmonary/Chest: Effort normal and breath sounds normal.  Neurological: He is alert and oriented to person, place, and time.  Skin:  Keratosis on left ear behind left ear left hand right hand and legs were frozen with liquid nitrogen          Assessment & Plan:  1. Type 2 diabetes mellitus with other circulatory complication, with long-term current use of insulin (HCC) Continue with  insulin and metformin. Pain stricter attention to carbohydrate - Bayer DCA Hb A1c Waived  2. Hyperlipidemia associated with type 2 diabetes mellitus (HCC) LDL is at goal on atorvastatin. Continue same  3. Essential hypertension, benign Needs better control of blood pressure. Had been on ace as well as beta blocker and these were stopped. Will try him on an ARB. Chances are that even though he had the Ace allergy he will be able to take this. Call if there are any rashes or reaction  Wardell Honour MD

## 2015-08-01 ENCOUNTER — Other Ambulatory Visit: Payer: Self-pay | Admitting: *Deleted

## 2015-08-01 MED ORDER — ONETOUCH ULTRA BLUE VI STRP: 1.0000 | ORAL_STRIP | Freq: Three times a day (TID) | Status: DC

## 2015-08-04 ENCOUNTER — Other Ambulatory Visit: Payer: Self-pay

## 2015-08-04 MED ORDER — METFORMIN HCL ER 500 MG PO TB24: ORAL_TABLET | ORAL | 0 refills | Status: DC

## 2015-08-10 ENCOUNTER — Other Ambulatory Visit: Payer: Self-pay | Admitting: Family Medicine

## 2015-08-21 ENCOUNTER — Other Ambulatory Visit: Payer: Self-pay | Admitting: Family Medicine

## 2015-08-21 DIAGNOSIS — Z961 Presence of intraocular lens: Secondary | ICD-10-CM | POA: Diagnosis not present

## 2015-08-21 DIAGNOSIS — H538 Other visual disturbances: Secondary | ICD-10-CM | POA: Diagnosis not present

## 2015-08-21 DIAGNOSIS — H2512 Age-related nuclear cataract, left eye: Secondary | ICD-10-CM | POA: Diagnosis not present

## 2015-08-21 DIAGNOSIS — E119 Type 2 diabetes mellitus without complications: Secondary | ICD-10-CM | POA: Diagnosis not present

## 2015-09-08 NOTE — Progress Notes (Signed)
Cardiology Office Note  Date: 09/09/2015   ID: [name], DOB February 09, 1940, MRN [id number]  PCP: [name] Honour, MD  Primary Cardiologist: [name] Lesches, MD   Chief Complaint  Patient presents with  . Coronary Artery Disease    History of Present Illness: [name] is a 75 y.o. male I last seen in February. He presents for a routine follow-up visit. Does not report any significant angina or nitroglycerin use, but feels like his stamina has decreased in the last year. Generally has NYHA class II dyspnea with typical activity. Still enjoys getting outdoors when he can, looking forward to the hunting season.  He continues to follow with [facility]. I reviewed his most recent lab work outlined below, LDL has been well controlled.  He states that he has injured his left rotator cuff and will ultimately be having arthroscopic surgery sometime later this year. He sees Dr. Theda [name] in [place].  Cardiac catheterization in May 2013 revealed moderate nonobstructive CAD with stented portion of small nondominant RCA demonstrating diffuse stenosis in the distal site and beyond with 80% segmental disease. He has not had follow-up ischemic testing as yet.  Past Medical History:  Diagnosis Date  . Acute encephalopathy 08/2010  . Adenomatous polyp   . Anxiety disorder   . Arthritis   . Chronic headaches   . Chronic headaches   . Common migraine 12/23/2014  . Coronary atherosclerosis of native coronary artery    BMS nondominant RCA 12/2004  . Diverticulosis   . Essential hypertension, benign   . Gallbladder cholesterolosis   . GERD (gastroesophageal reflux disease)   . Hyperlipidemia   . Internal hemorrhoids   . Myocardial infarction ([place])    2006/1 stent  . Nephrolithiasis   . Shoulder pain, right   . Type 2 diabetes mellitus ([place])     Past Surgical History:  Procedure Laterality Date  . CATARACT EXTRACTION    . CHOLECYSTECTOMY    . COLONOSCOPY  09/10/2008   normal    . CYSTOSCOPY W/ URETEROSCOPY W/ LITHOTRIPSY  08/2010  . JOINT REPLACEMENT     right hip  . LEFT HEART CATHETERIZATION WITH CORONARY ANGIOGRAM N/A 05/14/2011   Procedure: LEFT HEART CATHETERIZATION WITH CORONARY ANGIOGRAM;  Surgeon: [name] Mocha, MD;  Location[facility] C[facility] LAB;  Service: Cardiovascular;  Laterality: N/A;  . ROTATOR CUFF REPAIR     right  . TOTAL KNEE ARTHROPLASTY     Bil  . UPPER GASTROINTESTINAL ENDOSCOPY  01/08/2008   bx, inlet patch, duodenitis  . YAG LASER APPLICATION Right 8/85/0277   Procedure: YAG LASER APPLICATION;  Surgeon: Williams Che, MD;  Location: [facility] ORS;  Service: Ophthalmology;  Laterality: Right;    Current Outpatient Prescriptions  Medication Sig Dispense Refill  . acetaminophen (TYLENOL) 650 MG CR tablet Take 650 mg by mouth every 8 (eight) hours as needed for pain.    Marland Kitchen amLODipine (NORVASC) 5 MG tablet Take 1 tablet by mouth  daily 90 tablet 1  . aspirin 81 MG tablet Take 81 mg by mouth every morning.     Marland Kitchen atorvastatin (LIPITOR) 20 MG tablet TAKE 1 TABLET BY MOUTH  DAILY AT 6 PM. 90 tablet 0  . Blood Glucose Monitoring Suppl (ONE TOUCH ULTRA SYSTEM KIT) W/DEVICE KIT 1 kit by Does not apply route once. 1 each 0  . butalbital-acetaminophen-caffeine (FIORICET, ESGIC) 50-325-40 MG tablet Take 1 tablet by mouth 2 (two) times daily as needed for headache. 14 tablet 0  . cetirizine (  ZYRTEC) 10 MG tablet Take 10 mg by mouth every evening.     . chlorthalidone (HYGROTON) 25 MG tablet Take 1 tablet by mouth  daily as directed 90 tablet 1  . Cholecalciferol (VITAMIN D3) 2000 UNITS TABS Take 1 tablet by mouth every morning.     . famotidine (PEPCID) 20 MG tablet Take 1 tablet by mouth two  times daily 180 tablet 1  . HUMALOG KWIKPEN 100 UNIT/ML KiwkPen Inject 0-15 units into skin three times daily before  meals. 70-150=8 units  151-200= 9 units 45 mL 0  . Insulin Pen Needle (B-D ULTRAFINE III SHORT PEN) 31G X 8 MM MISC USE 4 TIMES DAILY DX E11.49 400 each 1  .  LANTUS SOLOSTAR 100 UNIT/ML Solostar Pen Inject subcutaneously 52  units at bedtime 60 mL 1  . losartan (COZAAR) 50 MG tablet Take 1 tablet (50 mg total) by mouth daily. 90 tablet 3  . metFORMIN (GLUCOPHAGE-XR) 500 MG 24 hr tablet Take 2 tablets by mouth two times daily 360 tablet 0  . nitroGLYCERIN (NITROSTAT) 0.4 MG SL tablet USE AS DIRECTED AS NEEDED FOR CHEST PAIN 25 tablet 3  . ONE TOUCH ULTRA TEST test strip 1 each by Other route 3 (three) times daily. 300 each 1  . PARoxetine (PAXIL) 20 MG tablet Take 1 tablet by mouth  daily 90 tablet 3  . prazosin (MINIPRESS) 1 MG capsule Take 1 capsule by mouth two times daily 180 capsule 0  . tamsulosin (FLOMAX) 0.4 MG CAPS capsule Take 1 capsule (0.4 mg total) by mouth daily. 30 capsule 3   No current facility-administered medications for this visit.    Allergies:  Ace inhibitors; Angiotensin receptor blockers; Ciprofloxacin; Other; Oxycodone-acetaminophen; Ramipril; Toradol [ketorolac tromethamine]; Valium; and Doxycycline   Social History: The patient  reports that he quit smoking about 27 years ago. His smoking use included Cigarettes. He started smoking about 52 years ago. He has a 25.00 pack-year smoking history. He quit smokeless tobacco use about 26 years ago. His smokeless tobacco use included Chew. He reports that he drinks alcohol. He reports that he does not use drugs.   ROS:  Please see the history of present illness. Otherwise, complete review of systems is positive for decreased hearing.  All other systems are reviewed and negative.   Physical Exam: VS:  BP 128/72   Pulse 76   Ht '5\' 6"'  (1.676 m)   Wt 220 lb (99.8 kg)   SpO2 95%   BMI 35.51 kg/m , BMI Body mass index is 35.51 kg/m.  Wt Readings from Last 3 Encounters:  09/09/15 220 lb (99.8 kg)  07/17/15 220 lb 3.2 oz (99.9 kg)  03/13/15 218 lb (98.9 kg)    General: Overweight male, appears comfortable at rest. HEENT: Conjunctiva and lids normal, oropharynx clear. Neck:  Supple, no elevated JVP or carotid bruits, no thyromegaly. Lungs: Clear to auscultation, nonlabored breathing at rest. Cardiac: Regular rate and rhythm, no S3 or significant systolic murmur, no pericardial rub. Abdomen: Protuberant, nontender, bowel sounds present, no guarding or rebound. Extremities: No pitting edema, distal pulses 2+. Skin: Warm and dry. Musculoskeletal: No kyphosis. Discomfort with raising left arm over level of shoulder. Neuropsychiatric: Alert and oriented x3, affect grossly appropriate.  ECG: I personally reviewed the tracing from 12/06/2014 which showed sinus rhythm.  Recent Labwork: 12/06/2014: Hemoglobin 13.8; Platelets 265 03/13/2015: ALT 28; AST 19; BUN 15; Creatinine, Ser 1.05; Potassium 5.1; Sodium 141     Component Value Date/Time  CHOL 132 03/13/2015 0849   CHOL 139 06/02/2012 1007   TRIG 174 (H) 03/13/2015 0849   TRIG 185 (H) 04/10/2013 0937   TRIG 107 06/02/2012 1007   HDL 35 (L) 03/13/2015 0849   HDL 34 (L) 04/10/2013 0937   HDL 40 06/02/2012 1007   CHOLHDL 3.8 03/13/2015 0849   CHOLHDL 6.2 05/14/2011 0550   VLDL UNABLE TO CALCULATE IF TRIGLYCERIDE OVER 400 mg/dL 05/14/2011 0550   LDLCALC 62 03/13/2015 0849   LDLCALC 62 04/10/2013 0937   LDLCALC 78 06/02/2012 1007    Other Studies Reviewed Today:  Carotid Dopplers 01/09/2015: Summary:  - Mild technical difficulty due to high bifurcation near the jaw  line. - Bilateral - 1% to 39% ICA stenosis in the bulb. Vertebral artery  flow is antegrade.  Assessment and Plan:  1. CAD status post BMS to nondominant RCA in 2006 with follow-up angiography in 2013 demonstrating moderate nonobstructive disease overall, more significant in the distal small RCA. He has been managed medically, no obvious angina but some decrease in stamina. Will plan a follow-up Lexiscan Myoview on medical therapy for reassessment of ischemic burden, also in light of pending orthopedic surgery later this year.  2.  Essential hypertension, blood pressure is well controlled today.  3. Hyperlipidemia, recent LDL 62 on Lipitor.  4. Type 2 diabetes mellitus, followed by Dr. Sabra Heck. Recent hemoglobin A1c 8.1. We did discuss weight loss today.  Current medicines were reviewed with the patient today.   Orders Placed This Encounter  Procedures  . NM Myocar Multi W/Spect W/Wall Motion / EF  . Myocardial Perfusion Imaging    Disposition: Follow-up with me in 6 months.  Signed, Satira Sark, MD, [facility] 09/09/2015 8:25 AM    [facility] [place], [place], [place] [address] Phone: [phone]; Fax: [phone]

## 2015-09-09 ENCOUNTER — Encounter: Payer: Self-pay | Admitting: *Deleted

## 2015-09-09 ENCOUNTER — Encounter: Payer: Self-pay | Admitting: Cardiology

## 2015-09-09 ENCOUNTER — Ambulatory Visit (INDEPENDENT_AMBULATORY_CARE_PROVIDER_SITE_OTHER): Payer: Medicare Other | Admitting: Cardiology

## 2015-09-09 VITALS — BP 128/72 | HR 76 | Ht 66.0 in | Wt 220.0 lb

## 2015-09-09 DIAGNOSIS — I10 Essential (primary) hypertension: Secondary | ICD-10-CM | POA: Diagnosis not present

## 2015-09-09 DIAGNOSIS — E782 Mixed hyperlipidemia: Secondary | ICD-10-CM

## 2015-09-09 DIAGNOSIS — I2510 Atherosclerotic heart disease of native coronary artery without angina pectoris: Secondary | ICD-10-CM | POA: Diagnosis not present

## 2015-09-09 DIAGNOSIS — E1159 Type 2 diabetes mellitus with other circulatory complications: Secondary | ICD-10-CM

## 2015-09-09 DIAGNOSIS — Z01818 Encounter for other preprocedural examination: Secondary | ICD-10-CM | POA: Diagnosis not present

## 2015-09-09 NOTE — Patient Instructions (Addendum)
Medication Instructions:  Continue all current medications.  Labwork: none  Testing/Procedures:  Your physician has requested that you have a lexiscan myoview. For further information please visit [email or web address]. Please follow instruction sheet, as given.  Office will contact with results via phone or letter.    Follow-Up: Your physician wants you to follow up in: 6 months.  You will receive a reminder letter in the mail one-two months in advance.  If you don't receive a letter, please call our office to schedule the follow up appointment   Any Other Special Instructions Will Be Listed Below (If Applicable).  If you need a refill on your cardiac medications before your next appointment, please call your pharmacy.  

## 2015-09-17 ENCOUNTER — Encounter (HOSPITAL_COMMUNITY)
Admission: RE | Admit: 2015-09-17 | Discharge: 2015-09-17 | Disposition: A | Discharge status: Home / Self Care | Payer: Medicare Other | Source: Ambulatory Visit | Attending: Cardiology | Admitting: Cardiology

## 2015-09-17 ENCOUNTER — Inpatient Hospital Stay (HOSPITAL_COMMUNITY): Admission: RE | Admit: 2015-09-17 | Payer: Self-pay | Source: Ambulatory Visit

## 2015-09-17 ENCOUNTER — Encounter (HOSPITAL_COMMUNITY): Payer: Self-pay

## 2015-09-17 ENCOUNTER — Telehealth: Payer: Self-pay | Admitting: *Deleted

## 2015-09-17 DIAGNOSIS — Z01818 Encounter for other preprocedural examination: Secondary | ICD-10-CM | POA: Diagnosis not present

## 2015-09-17 DIAGNOSIS — I2510 Atherosclerotic heart disease of native coronary artery without angina pectoris: Secondary | ICD-10-CM | POA: Diagnosis not present

## 2015-09-17 LAB — NM MYOCAR MULTI W/SPECT W/WALL MOTION / EF
CHL CUP RESTING HR STRESS: 64 {beats}/min
CSEPPHR: 81 {beats}/min
LVDIAVOL: 67 mL (ref 62–150)
LVSYSVOL: 26 mL
RATE: 0.41
SDS: 4
SRS: 1
SSS: 5
TID: 1.22

## 2015-09-17 MED ORDER — REGADENOSON 0.4 MG/5ML IV SOLN
INTRAVENOUS | Status: AC
  Administered 2015-09-17: 0.4 mg via INTRAVENOUS
  Filled 2015-09-17: qty 5

## 2015-09-17 MED ORDER — TECHNETIUM TC 99M TETROFOSMIN IV KIT
10.0000 | PACK | Freq: Once | INTRAVENOUS | Status: AC | PRN
  Administered 2015-09-17: 10.8 via INTRAVENOUS

## 2015-09-17 MED ORDER — TECHNETIUM TC 99M TETROFOSMIN IV KIT
30.0000 | PACK | Freq: Once | INTRAVENOUS | Status: AC | PRN
  Administered 2015-09-17: 31.2 via INTRAVENOUS

## 2015-09-17 MED ORDER — SODIUM CHLORIDE 0.9% FLUSH
INTRAVENOUS | Status: AC
  Administered 2015-09-17: 10 mL via INTRAVENOUS
  Filled 2015-09-17: qty 10

## 2015-09-17 NOTE — Telephone Encounter (Signed)
Patient's wife informed and copy sent to PCP. 

## 2015-09-17 NOTE — Telephone Encounter (Signed)
[name] returned telephone call to [facility].

## 2015-09-17 NOTE — Telephone Encounter (Signed)
-----   Message from Satira Sark, MD sent at 09/17/2015  2:54 PM EDT ----- Results reviewed. Stress testing mildly abnormal with a small region of ischemia in the anterior apex but normal LVEF. Consistent with CAD, but without large region of myocardium at risk, would suggest continuing medical therapy and observation at this point. A copy of this test should be forwarded to Wardell Honour, MD.

## 2015-09-19 DIAGNOSIS — L0291 Cutaneous abscess, unspecified: Secondary | ICD-10-CM | POA: Diagnosis not present

## 2015-09-29 ENCOUNTER — Other Ambulatory Visit: Payer: Self-pay

## 2015-09-29 DIAGNOSIS — L720 Epidermal cyst: Secondary | ICD-10-CM | POA: Diagnosis not present

## 2015-09-29 DIAGNOSIS — L723 Sebaceous cyst: Secondary | ICD-10-CM | POA: Diagnosis not present

## 2015-10-15 DIAGNOSIS — M19012 Primary osteoarthritis, left shoulder: Secondary | ICD-10-CM | POA: Diagnosis not present

## 2015-10-15 DIAGNOSIS — M75102 Unspecified rotator cuff tear or rupture of left shoulder, not specified as traumatic: Secondary | ICD-10-CM | POA: Diagnosis not present

## 2015-10-29 ENCOUNTER — Ambulatory Visit (INDEPENDENT_AMBULATORY_CARE_PROVIDER_SITE_OTHER): Payer: Medicare Other | Admitting: Family Medicine

## 2015-10-29 ENCOUNTER — Encounter: Payer: Self-pay | Admitting: Family Medicine

## 2015-10-29 VITALS — BP 124/74 | HR 68 | Temp 97.0°F | Ht 66.0 in | Wt 220.0 lb

## 2015-10-29 DIAGNOSIS — Z794 Long term (current) use of insulin: Secondary | ICD-10-CM | POA: Diagnosis not present

## 2015-10-29 DIAGNOSIS — E1159 Type 2 diabetes mellitus with other circulatory complications: Secondary | ICD-10-CM | POA: Diagnosis not present

## 2015-10-29 DIAGNOSIS — Z23 Encounter for immunization: Secondary | ICD-10-CM

## 2015-10-29 LAB — BAYER DCA HB A1C WAIVED: HB A1C: 7.5 % — AB (ref <7.0)

## 2015-10-29 NOTE — Progress Notes (Signed)
Subjective:    Patient ID: [name], male    DOB: 03/22/1940, 75 y.o.   MRN: [id number]  HPI 75 year old gentleman here for routine 3 month exam. He has type 2 diabetes hyperlipidemia and hypertension. He is scheduled for rotator cuff repair in the near future. He has some concerns today about his memory.  Patient Active Problem List   Diagnosis Date Noted  . Common migraine 12/23/2014  . Hx of adenomatous colonic polyps 05/09/2014  . Obesity (BMI 30-39.9) 07/30/2013  . Type 2 diabetes mellitus with circulatory disorder ([place]) 05/27/2011  . Hyperlipidemia associated with type 2 diabetes mellitus ([place]) 10/19/2008  . Essential hypertension, benign 10/19/2008  . CORONARY ATHEROSCLEROSIS NATIVE CORONARY ARTERY 10/17/2008  . GERD (gastroesophageal reflux disease) 12/27/2007   Outpatient Encounter Prescriptions as of 10/29/2015  Medication Sig  . acetaminophen (TYLENOL) 650 MG CR tablet Take 650 mg by mouth every 8 (eight) hours as needed for pain.  Marland Kitchen amLODipine (NORVASC) 5 MG tablet Take 1 tablet by mouth  daily  . aspirin 81 MG tablet Take 81 mg by mouth every morning.   Marland Kitchen atorvastatin (LIPITOR) 20 MG tablet TAKE 1 TABLET BY MOUTH  DAILY AT 6 PM.  . Blood Glucose Monitoring Suppl (ONE TOUCH ULTRA SYSTEM KIT) W/DEVICE KIT 1 kit by Does not apply route once.  . butalbital-acetaminophen-caffeine (FIORICET, ESGIC) 50-325-40 MG tablet Take 1 tablet by mouth 2 (two) times daily as needed for headache.  . cetirizine (ZYRTEC) 10 MG tablet Take 10 mg by mouth every evening.   . chlorthalidone (HYGROTON) 25 MG tablet Take 1 tablet by mouth  daily as directed  . Cholecalciferol (VITAMIN D3) 2000 UNITS TABS Take 1 tablet by mouth every morning.   . famotidine (PEPCID) 20 MG tablet Take 1 tablet by mouth two  times daily  . HUMALOG KWIKPEN 100 UNIT/ML KiwkPen Inject 0-15 units into skin three times daily before  meals. 70-150=8 units  151-200= 9 units  . Insulin Pen Needle (B-D ULTRAFINE III  SHORT PEN) 31G X 8 MM MISC USE 4 TIMES DAILY DX E11.49  . LANTUS SOLOSTAR 100 UNIT/ML Solostar Pen Inject subcutaneously 52  units at bedtime  . losartan (COZAAR) 50 MG tablet Take 1 tablet (50 mg total) by mouth daily.  . metFORMIN (GLUCOPHAGE-XR) 500 MG 24 hr tablet Take 2 tablets by mouth two times daily  . nitroGLYCERIN (NITROSTAT) 0.4 MG SL tablet USE AS DIRECTED AS NEEDED FOR CHEST PAIN  . ONE TOUCH ULTRA TEST test strip 1 each by Other route 3 (three) times daily.  Marland Kitchen PARoxetine (PAXIL) 20 MG tablet Take 1 tablet by mouth  daily  . prazosin (MINIPRESS) 1 MG capsule Take 1 capsule by mouth two times daily  . tamsulosin (FLOMAX) 0.4 MG CAPS capsule Take 1 capsule (0.4 mg total) by mouth daily.   No facility-administered encounter medications on file as of 10/29/2015.       Review of Systems  Constitutional: Negative.   Respiratory: Negative.   Cardiovascular: Negative.   Neurological: Negative.   Psychiatric/Behavioral: Negative.        Objective:   Physical Exam  Constitutional: He is oriented to person, place, and time. He appears well-developed and well-nourished.  HENT:  Mouth/Throat: Oropharynx is clear and moist.  Cardiovascular: Normal rate, regular rhythm and normal heart sounds.   Pulmonary/Chest: Effort normal.  Neurological: He is alert and oriented to person, place, and time.  Brief test of memory including 3 words at 5 minutes, spelling  world backwards, subtracting by sevens from 100 were all accomplished without difficulty.  Psychiatric: He has a normal mood and affect. His behavior is normal.   BP 124/74   Pulse 68   Temp 97 F (36.1 C) (Oral)   Ht 5' 6" (1.676 m)   Wt 220 lb (99.8 kg)   BMI 35.51 kg/m         Assessment & Plan:  1. Type 2 diabetes mellitus with other circulatory complication, with long-term current use of insulin (HCC) Ronnie's diabetes has been managed only fairly mainly because he eats whatever he wants to. Last A1c was 8.0 3  months ago. - Bayer DCA Hb A1c Waived  2. Encounter for immunization Senior flu vaccine given - Flu vaccine HIGH DOSE PF  Wardell Honour MD

## 2015-11-03 ENCOUNTER — Other Ambulatory Visit: Payer: Self-pay | Admitting: Family Medicine

## 2015-11-06 ENCOUNTER — Other Ambulatory Visit: Payer: Self-pay | Admitting: *Deleted

## 2015-11-06 MED ORDER — METFORMIN HCL ER 500 MG PO TB24: ORAL_TABLET | ORAL | 1 refills | Status: DC

## 2015-11-26 DIAGNOSIS — Z96653 Presence of artificial knee joint, bilateral: Secondary | ICD-10-CM | POA: Diagnosis not present

## 2015-11-26 DIAGNOSIS — Z96641 Presence of right artificial hip joint: Secondary | ICD-10-CM | POA: Diagnosis not present

## 2015-11-26 DIAGNOSIS — Z471 Aftercare following joint replacement surgery: Secondary | ICD-10-CM | POA: Diagnosis not present

## 2015-11-26 DIAGNOSIS — M1612 Unilateral primary osteoarthritis, left hip: Secondary | ICD-10-CM | POA: Diagnosis not present

## 2015-11-26 DIAGNOSIS — Z96651 Presence of right artificial knee joint: Secondary | ICD-10-CM | POA: Diagnosis not present

## 2015-12-01 LAB — PULMONARY FUNCTION TEST

## 2015-12-05 ENCOUNTER — Other Ambulatory Visit: Payer: Self-pay | Admitting: Family Medicine

## 2015-12-09 ENCOUNTER — Other Ambulatory Visit: Payer: Self-pay | Admitting: Cardiology

## 2015-12-09 MED ORDER — NITROGLYCERIN 0.4 MG SL SUBL: SUBLINGUAL_TABLET | SUBLINGUAL | 3 refills | Status: DC

## 2015-12-09 NOTE — Telephone Encounter (Signed)
Refill:    nitroGLYCERIN (NITROSTAT) 0.4 MG SL tablet   [facility], [place]   Patient has lost his last prescription

## 2015-12-09 NOTE — Telephone Encounter (Signed)
Medication sent to pharmacy  

## 2015-12-11 DIAGNOSIS — S46002A Unspecified injury of muscle(s) and tendon(s) of the rotator cuff of left shoulder, initial encounter: Secondary | ICD-10-CM | POA: Diagnosis not present

## 2015-12-11 DIAGNOSIS — S46012A Strain of muscle(s) and tendon(s) of the rotator cuff of left shoulder, initial encounter: Secondary | ICD-10-CM | POA: Diagnosis not present

## 2015-12-11 DIAGNOSIS — S43492A Other sprain of left shoulder joint, initial encounter: Secondary | ICD-10-CM | POA: Diagnosis not present

## 2015-12-11 DIAGNOSIS — S46112A Strain of muscle, fascia and tendon of long head of biceps, left arm, initial encounter: Secondary | ICD-10-CM | POA: Diagnosis not present

## 2015-12-11 DIAGNOSIS — M19012 Primary osteoarthritis, left shoulder: Secondary | ICD-10-CM | POA: Diagnosis not present

## 2015-12-11 DIAGNOSIS — G8918 Other acute postprocedural pain: Secondary | ICD-10-CM | POA: Diagnosis not present

## 2015-12-11 HISTORY — PX: ROTATOR CUFF REPAIR: SHX139

## 2015-12-13 ENCOUNTER — Other Ambulatory Visit: Payer: Self-pay | Admitting: Family Medicine

## 2015-12-16 ENCOUNTER — Other Ambulatory Visit: Payer: Self-pay | Admitting: *Deleted

## 2015-12-16 DIAGNOSIS — R531 Weakness: Secondary | ICD-10-CM | POA: Diagnosis not present

## 2015-12-16 MED ORDER — ATORVASTATIN CALCIUM 20 MG PO TABS: ORAL_TABLET | ORAL | 0 refills | Status: DC

## 2015-12-18 ENCOUNTER — Ambulatory Visit (HOSPITAL_COMMUNITY): Payer: Medicare Other | Attending: Specialist | Admitting: Physical Therapy

## 2015-12-18 ENCOUNTER — Encounter (HOSPITAL_COMMUNITY): Payer: Self-pay | Admitting: Physical Therapy

## 2015-12-18 DIAGNOSIS — M25512 Pain in left shoulder: Secondary | ICD-10-CM

## 2015-12-18 DIAGNOSIS — R29898 Other symptoms and signs involving the musculoskeletal system: Secondary | ICD-10-CM

## 2015-12-18 DIAGNOSIS — M25612 Stiffness of left shoulder, not elsewhere classified: Secondary | ICD-10-CM

## 2015-12-18 DIAGNOSIS — R293 Abnormal posture: Secondary | ICD-10-CM | POA: Diagnosis not present

## 2015-12-18 NOTE — Patient Instructions (Signed)
   SCAPULAR RETRACTIONS  Draw your shoulder blades back and down. It should feel like you are squeezing your shoulder blades together.  Repeat 10-15 times, 3 times a day.    RCR Pendulums  Stand up and let your arm slide out of the sling, hanging in front of you. Move your trunk, so that your arm is moving in a circular motion. Perform this exercise clockwise for 2 minutes, then counter-clockwise for 2 minutes.   Repeat 3 times per day.

## 2015-12-18 NOTE — Therapy (Signed)
[place], [place], [address] Phone: [phone]   Fax:  [phone]  Physical Therapy Evaluation  Patient Details  Name: [name] MRN: [id number] Date of Birth: 07-Dec-1940 No Data Recorded  Encounter Date: 12/18/2015      PT End of Session - 12/18/15 1348    Visit Number 1   Number of Visits 18   Date for PT Re-Evaluation 01/08/16   Authorization Type [employer] Medicare    Authorization Time Period 12/18/15 to 01/29/16   Authorization - Visit Number 1   Authorization - Number of Visits 10   PT Start Time 0947   PT Stop Time 1030   PT Time Calculation (min) 43 min   Activity Tolerance Patient tolerated treatment well   Behavior During Therapy [facility] for tasks assessed/performed      Past Medical History:  Diagnosis Date  . Acute encephalopathy 08/2010  . Adenomatous polyp   . Anxiety disorder   . Arthritis   . Chronic headaches   . Chronic headaches   . Common migraine 12/23/2014  . Coronary atherosclerosis of native coronary artery    BMS nondominant RCA 12/2004  . Diverticulosis   . Essential hypertension, benign   . Gallbladder cholesterolosis   . GERD (gastroesophageal reflux disease)   . Hyperlipidemia   . Internal hemorrhoids   . Myocardial infarction    2006/1 stent  . Nephrolithiasis   . Shoulder pain, right   . Type 2 diabetes mellitus ([place])     Past Surgical History:  Procedure Laterality Date  . CATARACT EXTRACTION    . CHOLECYSTECTOMY    . COLONOSCOPY  09/10/2008   normal  . CYSTOSCOPY W/ URETEROSCOPY W/ LITHOTRIPSY  08/2010  . JOINT REPLACEMENT     right hip  . LEFT HEART CATHETERIZATION WITH CORONARY ANGIOGRAM N/A 05/14/2011   Procedure: LEFT HEART CATHETERIZATION WITH CORONARY ANGIOGRAM;  Surgeon: Sherren Mocha, MD;  Location: [facility] CATH LAB;  Service: Cardiovascular;  Laterality: N/A;  . ROTATOR CUFF REPAIR     right  . TOTAL KNEE ARTHROPLASTY     Bil  . UPPER  GASTROINTESTINAL ENDOSCOPY  01/08/2008   bx, inlet patch, duodenitis  . YAG LASER APPLICATION Right [id number]   Procedure: YAG LASER APPLICATION;  Surgeon: Williams Che, MD;  Location: [facility];  Service: Ophthalmology;  Laterality: Right;    There were no vitals filed for this visit.       Subjective Assessment - 12/18/15 0952    Subjective Patient fell last January and damaged his L shoulder; he recently had surgery to repair all the injuries that he sustained. Surgery was done on November 30th. Patient's wife present and reports he has been having some severe reactions from pain medicines inculding mental status changes and hallucinations, they are going to call the MD about this. He does not like to wear the sling as it hurts his neck. His R shoulder was done years ago and he has had no problems with it until the L shoulder was operated on.     Pertinent History HTN, DM, GERD, anxiety, MI, cardiac cath, R hip replacement, R RCR repair    How long can you sit comfortably? immediate discomfort    How long can you stand comfortably? immediate discomfort    How long can you walk comfortably? immediate discomfort    Patient Stated Goals reduce pain, get L shoulder working well again    Currently in Pain? Yes  Pain Score 4    Pain Location Shoulder   Pain Orientation Left   Pain Descriptors / Indicators Burning;Constant;Discomfort   Pain Type Acute pain   Pain Radiating Towards runs up into the back of his neck and down through arm    Pain Onset 1 to 4 weeks ago   Pain Frequency Constant   Aggravating Factors  everything, easily aggravated right now    Pain Relieving Factors nothing    Effect of Pain on Daily Activities severe limitation             [facility] PT Assessment - 12/18/15 0001      Posture/Postural Control   Posture/Postural Control Postural limitations   Postural Limitations Rounded Shoulders;Forward head;Flexed trunk;Increased thoracic kyphosis   Posture Comments  cervical flexion, very poor posture in general     AROM   Left Shoulder Flexion 90 Degrees  PROM    Left Shoulder ABduction 30 Degrees   Left Shoulder External Rotation 0 Degrees  severe pain with PROM ER at approx 30 degrees      Strength   Overall Strength Comments grip strength approximately equal per manual testing    Right Elbow Flexion 4+/5   Right Elbow Extension 5/5   Left Elbow Flexion 4/5   Left Elbow Extension 4+/5   Right Wrist Flexion 4/5   Right Wrist Extension 4/5   Left Wrist Flexion 4/5   Left Wrist Extension 4/5                           PT Education - 12/18/15 1347    Education provided Yes   Education Details prognosis, POC, HEP, effect of posture on pain, general course of recovery post surgically, we will work on hip after shoulder is resolved if needed. UE positioning in sling    Person(s) Educated Patient;Spouse   Methods Explanation;Demonstration;Handout   Comprehension Verbalized understanding          PT Short Term Goals - 12/18/15 1352      PT SHORT TERM GOAL #1   Title Patient to demonstrate L shoulder flexion of 120 degrees and ABD of 110 degrees in order to show improvement of area per protocol and to reduce pain    Time 3   Period Weeks   Status New     PT SHORT TERM GOAL #2   Title Patient to demonstrate L shoulder IR as unlimited and L shoulder ER of 45 degrees in order to show progression per protocol and to reduce pain    Time 3   Period Weeks   Status New     PT SHORT TERM GOAL #3   Title Patient to be able to maintain correct posture at least 75% of the time in order to correct muscle imbalances and reduce pain    Time 3   Period Weeks   Status New     PT SHORT TERM GOAL #4   Title Patient to be independent in correctly and consistently performing appropriate HEP, to be updated PRN    Time 2   Period Weeks   Status New           PT Long Term Goals - 12/18/15 1354      PT LONG TERM GOAL #1    Title Patient to demonstrate L shoulder ROM for flexion, ABD, IR, and ER as being full and unlimited to reduce pain and improve functional task performance    Time  6   Period Weeks   Status New     PT LONG TERM GOAL #2   Title Patient to demonstrate functional strength in all tested groups as being 4/5 in order to improve functional task performance skills    Time 6   Period Weeks   Status New     PT LONG TERM GOAL #3   Title Patient to experience pain no more than 2/10 in order to improve functional and QOL    Time 6   Period Weeks   Status New     PT LONG TERM GOAL #4   Title Patient to be independent in all ADLs and self-care based activities in order to improve independence and functional performance    Time 6   Period Weeks   Status New     PT LONG TERM GOAL #5   Title Patient to demonstrate full cervical ROM in order to improve functional task performance and to assist in maintaining pain reduction    Time 6   Period Weeks   Status New               Plan - 12/18/15 1350    Clinical Impression Statement Patient arrives today approximately 1 week post extensive shoulder surgery; his wife is present throughout the evaluation and reports that patient has had severe mental status changes and hallucinations they believe secondary to his pain medications, they are going to call MD about this. Upon examination patient is very pain limited and requires cues from DPT and wife to participate in evaluation. ROM is limited and very painful passively, however strength in elbow and wrist/grip strength appear to be functional but will benefit from further strengthening. Recommend aggressive course of skilled PT services to address functional limitations and facilitate optimal outcomes. We will address his hip after his shoulder is resolved, however patient states that his hip is really no longer a concern to him at this point.    Rehab Potential Good   Clinical Impairments Affecting  Rehab Potential extensive surgical procedure    PT Frequency 3x / week   PT Duration 6 weeks   PT Treatment/Interventions ADLs/Self Care Home Management;Biofeedback;Cryotherapy;Moist Heat;Functional mobility training;Therapeutic activities;Therapeutic exercise;Balance training;Neuromuscular re-education;Patient/family education;Manual techniques;Passive range of motion;Energy conservation;Dry needling;Taping   PT Next Visit Plan review HEP and goals: follow Dr. Harrietta Guardian protocol for progression due to RCR aspect of surgery. Weeks 0-4 until 12/28.    PT Home Exercise Plan Eval: 3D cervical excursions, scapular retractions, shoulder pendulums    Consulted and Agree with Plan of Care Patient      Patient will benefit from skilled therapeutic intervention in order to improve the following deficits and impairments:  Decreased skin integrity, Improper body mechanics, Pain, Decreased mobility, Postural dysfunction, Decreased range of motion, Decreased strength, Hypomobility, Impaired flexibility  Visit Diagnosis: Acute pain of left shoulder - Plan: PT plan of care cert/re-cert  Stiffness of left shoulder, not elsewhere classified - Plan: PT plan of care cert/re-cert  Abnormal posture - Plan: PT plan of care cert/re-cert  Other symptoms and signs involving the musculoskeletal system - Plan: PT plan of care cert/re-cert      G-Codes - [id number]    Functional Assessment Tool Used Based on skilled clinical assessment of strength, ROM, posture, pain    Functional Limitation Mobility: Walking and moving around   Mobility: Walking and Moving Around Current Status [id number]) At least 60 percent but less than 80 percent impaired, limited or restricted   Mobility:  Walking and Moving Around Goal Status [phone]) At least 40 percent but less than 60 percent impaired, limited or restricted       Problem List Patient Active Problem List   Diagnosis Date Noted  . Common migraine 12/23/2014  . Hx of  adenomatous colonic polyps 05/09/2014  . Obesity (BMI 30-39.9) 07/30/2013  . Type 2 diabetes mellitus with circulatory disorder ([place]) 05/27/2011  . Hyperlipidemia associated with type 2 diabetes mellitus ([place]) 10/19/2008  . Essential hypertension, benign 10/19/2008  . CORONARY ATHEROSCLEROSIS NATIVE CORONARY ARTERY 10/17/2008  . GERD (gastroesophageal reflux disease) 12/27/2007    Deniece Ree PT, DPT [facility] [address] [place], [place], [address] Phone: [phone]   Fax:  [phone]  Name: OLINDO SWARBRICK MRN: [id number] Date of Birth: March 20, 1940

## 2015-12-22 ENCOUNTER — Ambulatory Visit (HOSPITAL_COMMUNITY): Payer: Medicare Other | Admitting: Physical Therapy

## 2015-12-22 DIAGNOSIS — R293 Abnormal posture: Secondary | ICD-10-CM | POA: Diagnosis not present

## 2015-12-22 DIAGNOSIS — R29898 Other symptoms and signs involving the musculoskeletal system: Secondary | ICD-10-CM | POA: Diagnosis not present

## 2015-12-22 DIAGNOSIS — M25512 Pain in left shoulder: Secondary | ICD-10-CM | POA: Diagnosis not present

## 2015-12-22 DIAGNOSIS — M25612 Stiffness of left shoulder, not elsewhere classified: Secondary | ICD-10-CM

## 2015-12-22 NOTE — Therapy (Signed)
[place], [place], [address] Phone: [phone]   Fax:  [phone]  Physical Therapy Treatment  Patient Details  Name: [name] MRN: [id number] Date of Birth: Nov 25, 1940 No Data Recorded  Encounter Date: 12/22/2015      PT End of Session - 12/22/15 1106    Visit Number 2   Number of Visits 18   Date for PT Re-Evaluation 01/08/16   Authorization Type UHC Medicare    Authorization Time Period 12/18/15 to 01/29/16   Authorization - Visit Number 2   Authorization - Number of Visits 10   PT Start Time 0947   PT Stop Time 1025   PT Time Calculation (min) 38 min   Activity Tolerance Patient tolerated treatment well   Behavior During Therapy [facility] for tasks assessed/performed      Past Medical History:  Diagnosis Date  . Acute encephalopathy 08/2010  . Adenomatous polyp   . Anxiety disorder   . Arthritis   . Chronic headaches   . Chronic headaches   . Common migraine 12/23/2014  . Coronary atherosclerosis of native coronary artery    BMS nondominant RCA 12/2004  . Diverticulosis   . Essential hypertension, benign   . Gallbladder cholesterolosis   . GERD (gastroesophageal reflux disease)   . Hyperlipidemia   . Internal hemorrhoids   . Myocardial infarction    2006/1 stent  . Nephrolithiasis   . Shoulder pain, right   . Type 2 diabetes mellitus ([place])     Past Surgical History:  Procedure Laterality Date  . CATARACT EXTRACTION    . CHOLECYSTECTOMY    . COLONOSCOPY  09/10/2008   normal  . CYSTOSCOPY W/ URETEROSCOPY W/ LITHOTRIPSY  08/2010  . JOINT REPLACEMENT     right hip  . LEFT HEART CATHETERIZATION WITH CORONARY ANGIOGRAM N/A 05/14/2011   Procedure: LEFT HEART CATHETERIZATION WITH CORONARY ANGIOGRAM;  Surgeon: Sherren Mocha, MD;  Location: [facility] CATH LAB;  Service: Cardiovascular;  Laterality: N/A;  . ROTATOR CUFF REPAIR     right  . TOTAL KNEE ARTHROPLASTY     Bil  . UPPER  GASTROINTESTINAL ENDOSCOPY  01/08/2008   bx, inlet patch, duodenitis  . YAG LASER APPLICATION Right [id number]   Procedure: YAG LASER APPLICATION;  Surgeon: Williams Che, MD;  Location: [facility];  Service: Ophthalmology;  Laterality: Right;    There were no vitals filed for this visit.      Subjective Assessment - 12/22/15 1055    Subjective Pt states he's still in alot of pain.  Reports he is no longer taking the narcotic pain meds and his mental status has improved.  Pt is only taking tylenol for pain. Continues to sleep in his chair and has neck discomfort from his sling.  MD has ordered him additional sling that will not pull on his neck..  Pt reports his hip is no longer bothering him.  Seated 4/10 and increases with increased mobility as high as 10/10.   Pain Score 4    Pain Location Shoulder   Pain Orientation Left   Pain Descriptors / Indicators Burning;Constant   Pain Type Acute pain   Pain Radiating Towards into neck and Lt shoulder   Pain Frequency Constant                         [facility] Adult PT Treatment/Exercise - 12/22/15 0001      Shoulder Exercises: Supine  External Rotation PROM;Left;5 reps   Flexion PROM;5 reps   ABduction PROM;5 reps     Shoulder Exercises: Seated   Retraction Both;10 reps   Other Seated Exercises postural cues, keeping head up, seated upright   Other Seated Exercises elbow flexion 10 reps     Manual Therapy   Manual Therapy Myofascial release;Soft tissue mobilization   Manual therapy comments completed seperately from all other skilled interventions   Soft tissue mobilization to cervical musculture to decrease spasm   Myofascial Release gentle, to Lt shoulder to increase mobility, decrease adhesions                PT Education - 12/22/15 1104    Education provided Yes   Education Details reviewed HEP and goals, initiated PROM for Lt shoulder, educated on posture   Person(s) Educated Patient   Methods  Explanation;Demonstration;Tactile cues;Verbal cues;Handout   Comprehension Verbalized understanding;Returned demonstration;Verbal cues required;Tactile cues required;Need further instruction          PT Short Term Goals - 12/18/15 1352      PT SHORT TERM GOAL #1   Title Patient to demonstrate L shoulder flexion of 120 degrees and ABD of 110 degrees in order to show improvement of area per protocol and to reduce pain    Time 3   Period Weeks   Status New     PT SHORT TERM GOAL #2   Title Patient to demonstrate L shoulder IR as unlimited and L shoulder ER of 45 degrees in order to show progression per protocol and to reduce pain    Time 3   Period Weeks   Status New     PT SHORT TERM GOAL #3   Title Patient to be able to maintain correct posture at least 75% of the time in order to correct muscle imbalances and reduce pain    Time 3   Period Weeks   Status New     PT SHORT TERM GOAL #4   Title Patient to be independent in correctly and consistently performing appropriate HEP, to be updated PRN    Time 2   Period Weeks   Status New           PT Long Term Goals - 12/18/15 1354      PT LONG TERM GOAL #1   Title Patient to demonstrate L shoulder ROM for flexion, ABD, IR, and ER as being full and unlimited to reduce pain and improve functional task performance    Time 6   Period Weeks   Status New     PT LONG TERM GOAL #2   Title Patient to demonstrate functional strength in all tested groups as being 4/5 in order to improve functional task performance skills    Time 6   Period Weeks   Status New     PT LONG TERM GOAL #3   Title Patient to experience pain no more than 2/10 in order to improve functional and QOL    Time 6   Period Weeks   Status New     PT LONG TERM GOAL #4   Title Patient to be independent in all ADLs and self-care based activities in order to improve independence and functional performance    Time 6   Period Weeks   Status New     PT LONG  TERM GOAL #5   Title Patient to demonstrate full cervical ROM in order to improve functional task performance and to assist in maintaining pain  reduction    Time 6   Period Weeks   Status New               Plan - 12/22/15 1116    Clinical Impression Statement educated with postural benefits and importance of keeping upright posturing to decrease pain and further complications.  Reviewed goals with patient from initial evaluation.  Very minimal PROM achieved this session as patient continues to have a great deal of pain and discomfort.  difficulty relaxing musculature to allow stretch and tightness in cervical musculture/Lt shoulder.  Pt responded well to manual techniques in both of these areas.  Pt is no longer having Lt hip discomfort.    Rehab Potential Good   Clinical Impairments Affecting Rehab Potential extensive surgical procedure    PT Frequency 3x / week   PT Duration 6 weeks   PT Treatment/Interventions ADLs/Self Care Home Management;Biofeedback;Cryotherapy;Moist Heat;Functional mobility training;Therapeutic activities;Therapeutic exercise;Balance training;Neuromuscular re-education;Patient/family education;Manual techniques;Passive range of motion;Energy conservation;Dry needling;Taping   PT Next Visit Plan follow Dr. Harrietta Guardian protocol for progression due to RCR aspect of surgery. Weeks 0-4 until 12/28 which includes PROM, pendulum, supine wand ER/IR, gentle isometric with UE at side, AROM of elbow/wrist/hand   PT Home Exercise Plan Eval: 3D cervical excursions, scapular retractions, shoulder pendulums    Consulted and Agree with Plan of Care Patient      Patient will benefit from skilled therapeutic intervention in order to improve the following deficits and impairments:  Decreased skin integrity, Improper body mechanics, Pain, Decreased mobility, Postural dysfunction, Decreased range of motion, Decreased strength, Hypomobility, Impaired flexibility  Visit Diagnosis: Acute  pain of left shoulder  Stiffness of left shoulder, not elsewhere classified  Abnormal posture  Other symptoms and signs involving the musculoskeletal system     Problem List Patient Active Problem List   Diagnosis Date Noted  . Common migraine 12/23/2014  . Hx of adenomatous colonic polyps 05/09/2014  . Obesity (BMI 30-39.9) 07/30/2013  . Type 2 diabetes mellitus with circulatory disorder ([place]) 05/27/2011  . Hyperlipidemia associated with type 2 diabetes mellitus ([place]) 10/19/2008  . Essential hypertension, benign 10/19/2008  . CORONARY ATHEROSCLEROSIS NATIVE CORONARY ARTERY 10/17/2008  . GERD (gastroesophageal reflux disease) 12/27/2007    Teena Irani, PTA/CLT [phone]  12/22/2015, 11:23 AM  [facility] [address] [place], [place], [address] Phone: [phone]   Fax:  [phone]  Name: KYUS ZERKEL MRN: [id number] Date of Birth: 02/20/1940

## 2015-12-24 ENCOUNTER — Ambulatory Visit (HOSPITAL_COMMUNITY): Payer: Medicare Other

## 2015-12-24 DIAGNOSIS — R29898 Other symptoms and signs involving the musculoskeletal system: Secondary | ICD-10-CM | POA: Diagnosis not present

## 2015-12-24 DIAGNOSIS — M25612 Stiffness of left shoulder, not elsewhere classified: Secondary | ICD-10-CM

## 2015-12-24 DIAGNOSIS — M25512 Pain in left shoulder: Secondary | ICD-10-CM | POA: Diagnosis not present

## 2015-12-24 DIAGNOSIS — R293 Abnormal posture: Secondary | ICD-10-CM

## 2015-12-24 NOTE — Therapy (Signed)
[facility] [address] [place], [place], [address] Phone: [phone]   Fax:  [phone]  Physical Therapy Treatment  Patient Details  Name: [name] MRN: [id number] Date of Birth: Aug 17, 1940 No Data Recorded  Encounter Date: 12/24/2015      PT End of Session - 12/24/15 0947    Visit Number 3   Number of Visits 18   Date for PT Re-Evaluation 01/08/16   Authorization Type U[employer] Medicare    Authorization Time Period 12/18/15 to 01/29/16   Authorization - Visit Number 3   Authorization - Number of Visits 10   PT Start Time 0941   PT Stop Time 1028   PT Time Calculation (min) 47 min   Activity Tolerance Patient tolerated treatment well   Behavior During Therapy [facility] for tasks assessed/performed      Past Medical History:  Diagnosis Date  . Acute encephalopathy 08/2010  . Adenomatous polyp   . Anxiety disorder   . Arthritis   . Chronic headaches   . Chronic headaches   . Common migraine 12/23/2014  . Coronary atherosclerosis of native coronary artery    BMS nondominant RCA 12/2004  . Diverticulosis   . Essential hypertension, benign   . Gallbladder cholesterolosis   . GERD (gastroesophageal reflux disease)   . Hyperlipidemia   . Internal hemorrhoids   . Myocardial infarction    2006/1 stent  . Nephrolithiasis   . Shoulder pain, right   . Type 2 diabetes mellitus ([place])     Past Surgical History:  Procedure Laterality Date  . CATARACT EXTRACTION    . CHOLECYSTECTOMY    . COLONOSCOPY  09/10/2008   normal  . CYSTOSCOPY W/ URETEROSCOPY W/ LITHOTRIPSY  08/2010  . JOINT REPLACEMENT     right hip  . LEFT HEART CATHETERIZATION WITH CORONARY ANGIOGRAM N/A 05/14/2011   Procedure: LEFT HEART CATHETERIZATION WITH CORONARY ANGIOGRAM;  Surgeon: Sherren Mocha, MD;  Location: [facility] CATH LAB;  Service: Cardiovascular;  Laterality: N/A;  . ROTATOR CUFF REPAIR     right  . TOTAL KNEE ARTHROPLASTY     Bil  . UPPER  GASTROINTESTINAL ENDOSCOPY  01/08/2008   bx, inlet patch, duodenitis  . YAG LASER APPLICATION Right [id number]   Procedure: YAG LASER APPLICATION;  Surgeon: Williams Che, MD;  Location: [facility];  Service: Ophthalmology;  Laterality: Right;    There were no vitals filed for this visit.      Subjective Assessment - 12/24/15 0944    Subjective Pt stated he has increased discomfort sleeping in bed, has been sleeping in recliner with increased discomfort on rearend. Current Lt shoulder pain scale 5/10.      Pertinent History HTN, DM, GERD, anxiety, MI, cardiac cath, R hip replacement, R RCR repair    Patient Stated Goals reduce pain, get L shoulder working well again    Currently in Pain? Yes   Pain Score 5    Pain Location Shoulder   Pain Orientation Left   Pain Descriptors / Indicators Constant  worse with movement   Pain Type Acute pain   Pain Radiating Towards into neck and Lt shoulder   Pain Onset 1 to 4 weeks ago   Pain Frequency Constant   Aggravating Factors  everything, easily aggravated right now   Pain Relieving Factors nothing   Effect of Pain on Daily Activities severe limitation           [facility] Adult PT Treatment/Exercise - 12/24/15 0001  Posture/Postural Control   Posture/Postural Control Postural limitations   Postural Limitations Rounded Shoulders;Forward head;Flexed trunk;Increased thoracic kyphosis   Posture Comments cervical flexion, very poor posture in general     Shoulder Exercises: Supine   External Rotation PROM;5 reps;AAROM;10 reps  AAROM with wand   Flexion PROM;5 reps   ABduction PROM;5 reps   Other Supine Exercises elbow flexion 3 sets x 10 reps   Other Supine Exercises grip strengthening (squeeze apple) 10x     Shoulder Exercises: Seated   Retraction Both;10 reps  2 sets   External Rotation Left;5 reps;Limitations   External Rotation Limitations wand     Shoulder Exercises: Standing   Other Standing Exercises Pendellum x 4 min      Manual Therapy   Manual Therapy Myofascial release;Soft tissue mobilization;Passive ROM   Manual therapy comments completed seperately from all other skilled interventions   Soft tissue mobilization to cervical musculture to decrease spasm   Myofascial Release gentle, to Lt shoulder to increase mobility, decrease adhesions   Passive ROM Lt shoulder PROM flexion, abd and ER per pt tolerance                  PT Short Term Goals - 12/18/15 1352      PT SHORT TERM GOAL #1   Title Patient to demonstrate L shoulder flexion of 120 degrees and ABD of 110 degrees in order to show improvement of area per protocol and to reduce pain    Time 3   Period Weeks   Status New     PT SHORT TERM GOAL #2   Title Patient to demonstrate L shoulder IR as unlimited and L shoulder ER of 45 degrees in order to show progression per protocol and to reduce pain    Time 3   Period Weeks   Status New     PT SHORT TERM GOAL #3   Title Patient to be able to maintain correct posture at least 75% of the time in order to correct muscle imbalances and reduce pain    Time 3   Period Weeks   Status New     PT SHORT TERM GOAL #4   Title Patient to be independent in correctly and consistently performing appropriate HEP, to be updated PRN    Time 2   Period Weeks   Status New           PT Long Term Goals - 12/18/15 1354      PT LONG TERM GOAL #1   Title Patient to demonstrate L shoulder ROM for flexion, ABD, IR, and ER as being full and unlimited to reduce pain and improve functional task performance    Time 6   Period Weeks   Status New     PT LONG TERM GOAL #2   Title Patient to demonstrate functional strength in all tested groups as being 4/5 in order to improve functional task performance skills    Time 6   Period Weeks   Status New     PT LONG TERM GOAL #3   Title Patient to experience pain no more than 2/10 in order to improve functional and QOL    Time 6   Period Weeks   Status New      PT LONG TERM GOAL #4   Title Patient to be independent in all ADLs and self-care based activities in order to improve independence and functional performance    Time 6   Period Weeks  Status New     PT LONG TERM GOAL #5   Title Patient to demonstrate full cervical ROM in order to improve functional task performance and to assist in maintaining pain reduction    Time 6   Period Weeks   Status New               Plan - 12/24/15 1255    Clinical Impression Statement Continued session focus on improving shoulder mobility with PROM per protocol and UE elbow, wrist and hand strengtheing.  Pt with improved tolerance with PROM this session with reports of pain reduced with contract relax techniques.  Verbal cueing to improve muscle relaxation with pendulum exercise for maximal benefits.  Manual techniques were complete to reduce fascial restrictions and soft tissue mobilization technqiues complete to reduce tightness for pain control and to improve PROM.  EOS pt reports less tightness and pain following manual.     Rehab Potential Good   Clinical Impairments Affecting Rehab Potential extensive surgical procedure    PT Frequency 3x / week   PT Duration 6 weeks   PT Treatment/Interventions ADLs/Self Care Home Management;Biofeedback;Cryotherapy;Moist Heat;Functional mobility training;Therapeutic activities;Therapeutic exercise;Balance training;Neuromuscular re-education;Patient/family education;Manual techniques;Passive range of motion;Energy conservation;Dry needling;Taping   PT Next Visit Plan follow Dr. Harrietta Guardian protocol for progression due to RCR aspect of surgery. Weeks 0-4 until 12/28 which includes PROM, pendulum, supine wand ER/IR, gentle isometric with UE at side, AROM of elbow/wrist/hand      Patient will benefit from skilled therapeutic intervention in order to improve the following deficits and impairments:  Decreased skin integrity, Improper body mechanics, Pain, Decreased  mobility, Postural dysfunction, Decreased range of motion, Decreased strength, Hypomobility, Impaired flexibility  Visit Diagnosis: Acute pain of left shoulder  Stiffness of left shoulder, not elsewhere classified  Abnormal posture  Other symptoms and signs involving the musculoskeletal system     Problem List Patient Active Problem List   Diagnosis Date Noted  . Common migraine 12/23/2014  . Hx of adenomatous colonic polyps 05/09/2014  . Obesity (BMI 30-39.9) 07/30/2013  . Type 2 diabetes mellitus with circulatory disorder ([place]) 05/27/2011  . Hyperlipidemia associated with type 2 diabetes mellitus ([place]) 10/19/2008  . Essential hypertension, benign 10/19/2008  . CORONARY ATHEROSCLEROSIS NATIVE CORONARY ARTERY 10/17/2008  . GERD (gastroesophageal reflux disease) 12/27/2007   Ihor Austin, LPTA; CBIS [phone]  Aldona Lento 12/24/2015, 1:17 PM  [place], [place], [address] Phone: [phone]   Fax:  [phone]  Name: [name] MRN: [id number] Date of Birth: Mar 11, 1940

## 2015-12-29 ENCOUNTER — Ambulatory Visit (HOSPITAL_COMMUNITY): Payer: Medicare Other | Admitting: Physical Therapy

## 2015-12-29 DIAGNOSIS — R293 Abnormal posture: Secondary | ICD-10-CM

## 2015-12-29 DIAGNOSIS — R29898 Other symptoms and signs involving the musculoskeletal system: Secondary | ICD-10-CM | POA: Diagnosis not present

## 2015-12-29 DIAGNOSIS — M25612 Stiffness of left shoulder, not elsewhere classified: Secondary | ICD-10-CM | POA: Diagnosis not present

## 2015-12-29 DIAGNOSIS — M25512 Pain in left shoulder: Secondary | ICD-10-CM | POA: Diagnosis not present

## 2015-12-29 NOTE — Therapy (Signed)
[name], [place], [address] Phone: [phone]   Fax:  [phone]  Physical Therapy Treatment  Patient Details  Name: [name] MRN: [id number] Date of Birth: 11/04/1940 No Data Recorded  Encounter Date: 12/29/2015      PT End of Session - 12/29/15 1233    Visit Number 4   Number of Visits 18   Date for PT Re-Evaluation 01/08/16   Authorization Type UHC Medicare    Authorization Time Period 12/18/15 to 01/29/16   Authorization - Visit Number 4   Authorization - Number of Visits 10   PT Start Time 1120   PT Stop Time 1155   PT Time Calculation (min) 35 min   Activity Tolerance Patient tolerated treatment well   Behavior During Therapy [facility] for tasks assessed/performed      Past Medical History:  Diagnosis Date  . Acute encephalopathy 08/2010  . Adenomatous polyp   . Anxiety disorder   . Arthritis   . Chronic headaches   . Chronic headaches   . Common migraine 12/23/2014  . Coronary atherosclerosis of native coronary artery    BMS nondominant RCA 12/2004  . Diverticulosis   . Essential hypertension, benign   . Gallbladder cholesterolosis   . GERD (gastroesophageal reflux disease)   . Hyperlipidemia   . Internal hemorrhoids   . Myocardial infarction    2006/1 stent  . Nephrolithiasis   . Shoulder pain, right   . Type 2 diabetes mellitus ([place])     Past Surgical History:  Procedure Laterality Date  . CATARACT EXTRACTION    . CHOLECYSTECTOMY    . COLONOSCOPY  09/10/2008   normal  . CYSTOSCOPY W/ URETEROSCOPY W/ LITHOTRIPSY  08/2010  . JOINT REPLACEMENT     right hip  . LEFT HEART CATHETERIZATION WITH CORONARY ANGIOGRAM N/A 05/14/2011   Procedure: LEFT HEART CATHETERIZATION WITH CORONARY ANGIOGRAM;  Surgeon: Sherren Mocha, MD;  Location: [facility] CATH LAB;  Service: Cardiovascular;  Laterality: N/A;  . ROTATOR CUFF REPAIR     right  . TOTAL KNEE ARTHROPLASTY     Bil  . UPPER  GASTROINTESTINAL ENDOSCOPY  01/08/2008   bx, inlet patch, duodenitis  . YAG LASER APPLICATION Right 123456   Procedure: YAG LASER APPLICATION;  Surgeon: Williams Che, MD;  Location: [facility];  Service: Ophthalmology;  Laterality: Right;    There were no vitals filed for this visit.      Subjective Assessment - 12/29/15 1229    Subjective Pt states he is still having pain trying to sleep in his bed so continues to sleep in his recliner.  Reports the new sling is working better for him.  Returns to MD next week and will also begin next phase of protocol on 12/28.    Currently in Pain? Yes   Pain Score 4    Pain Location Shoulder   Pain Orientation Left   Pain Descriptors / Indicators Constant   Pain Type Surgical pain                         [facility] Adult PT Treatment/Exercise - 12/29/15 0001      Shoulder Exercises: Supine   External Rotation PROM;5 reps;AAROM;10 reps   Flexion PROM;5 reps   ABduction PROM;5 reps   Other Supine Exercises elbow flexion 3 sets x 10 reps   Other Supine Exercises gentle isometrics with UE at side, IR/ER, extension  Shoulder Exercises: Seated   Retraction Both;10 reps   External Rotation Left;5 reps;Limitations   Other Seated Exercises postural cues, keeping head up, seated upright   Other Seated Exercises elbow flexion 10 reps     Manual Therapy   Manual Therapy Myofascial release;Passive ROM   Manual therapy comments completed seperately from all other skilled interventions   Myofascial Release gentle, to Lt shoulder to increase mobility, decrease adhesions   Passive ROM Lt shoulder PROM flexion, abd and ER per pt tolerance                  PT Short Term Goals - 12/18/15 1352      PT SHORT TERM GOAL #1   Title Patient to demonstrate L shoulder flexion of 120 degrees and ABD of 110 degrees in order to show improvement of area per protocol and to reduce pain    Time 3   Period Weeks   Status New     PT  SHORT TERM GOAL #2   Title Patient to demonstrate L shoulder IR as unlimited and L shoulder ER of 45 degrees in order to show progression per protocol and to reduce pain    Time 3   Period Weeks   Status New     PT SHORT TERM GOAL #3   Title Patient to be able to maintain correct posture at least 75% of the time in order to correct muscle imbalances and reduce pain    Time 3   Period Weeks   Status New     PT SHORT TERM GOAL #4   Title Patient to be independent in correctly and consistently performing appropriate HEP, to be updated PRN    Time 2   Period Weeks   Status New           PT Long Term Goals - 12/18/15 1354      PT LONG TERM GOAL #1   Title Patient to demonstrate L shoulder ROM for flexion, ABD, IR, and ER as being full and unlimited to reduce pain and improve functional task performance    Time 6   Period Weeks   Status New     PT LONG TERM GOAL #2   Title Patient to demonstrate functional strength in all tested groups as being 4/5 in order to improve functional task performance skills    Time 6   Period Weeks   Status New     PT LONG TERM GOAL #3   Title Patient to experience pain no more than 2/10 in order to improve functional and QOL    Time 6   Period Weeks   Status New     PT LONG TERM GOAL #4   Title Patient to be independent in all ADLs and self-care based activities in order to improve independence and functional performance    Time 6   Period Weeks   Status New     PT LONG TERM GOAL #5   Title Patient to demonstrate full cervical ROM in order to improve functional task performance and to assist in maintaining pain reduction    Time 6   Period Weeks   Status New               Plan - 12/29/15 1233    Clinical Impression Statement Began session with myofascial techniques prior to PROM.  Able to achieve approximately 90 degrees abduction, 40 degrees ER and 110 degrees flexion.  Began gentle isometrics (per protocol) with  UE at side.   completed IR/ER and extension then began to have increase in sharp shooting pains following extension so stopped therapy at this point (and per request).  Pt is making good progress with ROM and noted improvement in cervical ROM and posturing.     Rehab Potential Good   Clinical Impairments Affecting Rehab Potential extensive surgical procedure    PT Frequency 3x / week   PT Duration 6 weeks   PT Treatment/Interventions ADLs/Self Care Home Management;Biofeedback;Cryotherapy;Moist Heat;Functional mobility training;Therapeutic activities;Therapeutic exercise;Balance training;Neuromuscular re-education;Patient/family education;Manual techniques;Passive range of motion;Energy conservation;Dry needling;Taping   PT Next Visit Plan follow Dr. Harrietta Guardian protocol for progression due to RCR aspect of surgery. Weeks 0-4 until 12/28 which includes PROM, pendulum, supine wand ER/IR, gentle isometric with UE at side, AROM of elbow/wrist/hand      Patient will benefit from skilled therapeutic intervention in order to improve the following deficits and impairments:  Decreased skin integrity, Improper body mechanics, Pain, Decreased mobility, Postural dysfunction, Decreased range of motion, Decreased strength, Hypomobility, Impaired flexibility  Visit Diagnosis: Acute pain of left shoulder  Stiffness of left shoulder, not elsewhere classified  Abnormal posture  Other symptoms and signs involving the musculoskeletal system     Problem List Patient Active Problem List   Diagnosis Date Noted  . Common migraine 12/23/2014  . Hx of adenomatous colonic polyps 05/09/2014  . Obesity (BMI 30-39.9) 07/30/2013  . Type 2 diabetes mellitus with circulatory disorder ([place]) 05/27/2011  . Hyperlipidemia associated with type 2 diabetes mellitus ([place]) 10/19/2008  . Essential hypertension, benign 10/19/2008  . CORONARY ATHEROSCLEROSIS NATIVE CORONARY ARTERY 10/17/2008  . GERD (gastroesophageal reflux disease) 12/27/2007     Teena Irani, PTA/CLT [phone]  12/29/2015, 12:46 PM  [facility] [address] [place], [place], [address] Phone: [phone]   Fax:  [phone]  Name: UNKOWN DEFRONZO MRN: [id number] Date of Birth: [birth year]

## 2016-01-01 ENCOUNTER — Ambulatory Visit (HOSPITAL_COMMUNITY): Payer: Medicare Other | Admitting: Physical Therapy

## 2016-01-01 DIAGNOSIS — R29898 Other symptoms and signs involving the musculoskeletal system: Secondary | ICD-10-CM | POA: Diagnosis not present

## 2016-01-01 DIAGNOSIS — R293 Abnormal posture: Secondary | ICD-10-CM

## 2016-01-01 DIAGNOSIS — M25512 Pain in left shoulder: Secondary | ICD-10-CM

## 2016-01-01 DIAGNOSIS — M25612 Stiffness of left shoulder, not elsewhere classified: Secondary | ICD-10-CM | POA: Diagnosis not present

## 2016-01-01 NOTE — Therapy (Signed)
[facility] [address] [place], [place], [address] Phone: [phone]   Fax:  [phone]  Physical Therapy Treatment  Patient Details  Name: [name] MRN: [id number] Date of Birth: [birth year] No Data Recorded  Encounter Date: 01/01/2016      PT End of Session - 01/01/16 1034    Visit Number 5   Number of Visits 18   Date for PT Re-Evaluation 01/08/16   Authorization Type UHC Medicare    Authorization Time Period 12/18/15 to 01/29/16   Authorization - Visit Number 5   Authorization - Number of Visits 10   PT Start Time 0949   PT Stop Time 1028   PT Time Calculation (min) 39 min   Activity Tolerance Patient tolerated treatment well   Behavior During Therapy [facility] for tasks assessed/performed      Past Medical History:  Diagnosis Date  . Acute encephalopathy 08/2010  . Adenomatous polyp   . Anxiety disorder   . Arthritis   . Chronic headaches   . Chronic headaches   . Common migraine 12/23/2014  . Coronary atherosclerosis of native coronary artery    BMS nondominant RCA 12/2004  . Diverticulosis   . Essential hypertension, benign   . Gallbladder cholesterolosis   . GERD (gastroesophageal reflux disease)   . Hyperlipidemia   . Internal hemorrhoids   . Myocardial infarction    2006/1 stent  . Nephrolithiasis   . Shoulder pain, right   . Type 2 diabetes mellitus ([place])     Past Surgical History:  Procedure Laterality Date  . CATARACT EXTRACTION    . CHOLECYSTECTOMY    . COLONOSCOPY  09/10/2008   normal  . CYSTOSCOPY W/ URETEROSCOPY W/ LITHOTRIPSY  08/2010  . JOINT REPLACEMENT     right hip  . LEFT HEART CATHETERIZATION WITH CORONARY ANGIOGRAM N/A 05/14/2011   Procedure: LEFT HEART CATHETERIZATION WITH CORONARY ANGIOGRAM;  Surgeon: Sherren Mocha, MD;  Location: [facility] CATH LAB;  Service: Cardiovascular;  Laterality: N/A;  . ROTATOR CUFF REPAIR     right  . TOTAL KNEE ARTHROPLASTY     Bil  . UPPER  GASTROINTESTINAL ENDOSCOPY  01/08/2008   bx, inlet patch, duodenitis  . YAG LASER APPLICATION Right [id number]   Procedure: YAG LASER APPLICATION;  Surgeon: Williams Che, MD;  Location: [facility];  Service: Ophthalmology;  Laterality: Right;    There were no vitals filed for this visit.      Subjective Assessment - 01/01/16 0951    Subjective Patient arrives today stating he still does not like wearing the sling, he has an appointment with his MD next week and hopes that he can come out of sling. He was able to sleep in his own bed all night yesterday for the first time. Nothing else major going on.    Pertinent History HTN, DM, GERD, anxiety, MI, cardiac cath, R hip replacement, R RCR repair    Patient Stated Goals reduce pain, get L shoulder working well again    Currently in Pain? Yes   Pain Score 3    Pain Location Shoulder   Pain Orientation Left                         [facility] Adult PT Treatment/Exercise - 01/01/16 0001      Shoulder Exercises: Supine   External Rotation PROM;15 reps  to approximately 45 degrees at approximately 35 degrees ABD    Flexion PROM;15 reps  Flexion Limitations to approx 120 degrees per MD protocol, distractions provided between reps    ABduction PROM;15 reps   ABduction Limitations to approximately 110-120 degrees per MD protocol, distractions provided between reps    Other Supine Exercises elbow flexion 2x15 with 1 then 2#; pendulums CW and CCW 2 minutes each way    Other Supine Exercises grip strength isometric holds with washcloth 1x15 with 5 second holds; gentle isometrics for L shoulder flexion/extension/IR/ER 1x10 with UE at side                  PT Education - 01/01/16 1034    Education provided Yes   Education Details keeping blood sugars in appropriate ranges can help to facilitate healing    Person(s) Educated Patient   Methods Explanation   Comprehension Verbalized understanding          PT Short Term  Goals - 12/18/15 1352      PT SHORT TERM GOAL #1   Title Patient to demonstrate L shoulder flexion of 120 degrees and ABD of 110 degrees in order to show improvement of area per protocol and to reduce pain    Time 3   Period Weeks   Status New     PT SHORT TERM GOAL #2   Title Patient to demonstrate L shoulder IR as unlimited and L shoulder ER of 45 degrees in order to show progression per protocol and to reduce pain    Time 3   Period Weeks   Status New     PT SHORT TERM GOAL #3   Title Patient to be able to maintain correct posture at least 75% of the time in order to correct muscle imbalances and reduce pain    Time 3   Period Weeks   Status New     PT SHORT TERM GOAL #4   Title Patient to be independent in correctly and consistently performing appropriate HEP, to be updated PRN    Time 2   Period Weeks   Status New           PT Long Term Goals - 12/18/15 1354      PT LONG TERM GOAL #1   Title Patient to demonstrate L shoulder ROM for flexion, ABD, IR, and ER as being full and unlimited to reduce pain and improve functional task performance    Time 6   Period Weeks   Status New     PT LONG TERM GOAL #2   Title Patient to demonstrate functional strength in all tested groups as being 4/5 in order to improve functional task performance skills    Time 6   Period Weeks   Status New     PT LONG TERM GOAL #3   Title Patient to experience pain no more than 2/10 in order to improve functional and QOL    Time 6   Period Weeks   Status New     PT LONG TERM GOAL #4   Title Patient to be independent in all ADLs and self-care based activities in order to improve independence and functional performance    Time 6   Period Weeks   Status New     PT LONG TERM GOAL #5   Title Patient to demonstrate full cervical ROM in order to improve functional task performance and to assist in maintaining pain reduction    Time 6   Period Weeks   Status New  Plan - 01/01/16 1036    Clinical Impression Statement Continued with MD protocol, with focus primarily on PROM and strengthening of rest of UE/postural musculature today. Patient appears to be progressing well in general with DPT estimates of ROM meeting MD expectations for weeks 0-4; reassessment to be performed next week with likely progression of protocol to AAROM phase on 12/28.    Rehab Potential Good   Clinical Impairments Affecting Rehab Potential extensive surgical procedure    PT Frequency 3x / week   PT Duration 6 weeks   PT Treatment/Interventions ADLs/Self Care Home Management;Biofeedback;Cryotherapy;Moist Heat;Functional mobility training;Therapeutic activities;Therapeutic exercise;Balance training;Neuromuscular re-education;Patient/family education;Manual techniques;Passive range of motion;Energy conservation;Dry needling;Taping   PT Next Visit Plan follow Dr. Harrietta Guardian protocol for progression due to RCR aspect of surgery. Weeks 0-4(PROM phase) until 12/28 which includes PROM, pendulum, supine wand ER/IR, gentle isometric with UE at side, AROM of elbow/wrist/hand   PT Home Exercise Plan Eval: 3D cervical excursions, scapular retractions, shoulder pendulums    Consulted and Agree with Plan of Care Patient      Patient will benefit from skilled therapeutic intervention in order to improve the following deficits and impairments:  Decreased skin integrity, Improper body mechanics, Pain, Decreased mobility, Postural dysfunction, Decreased range of motion, Decreased strength, Hypomobility, Impaired flexibility  Visit Diagnosis: Acute pain of left shoulder  Stiffness of left shoulder, not elsewhere classified  Abnormal posture  Other symptoms and signs involving the musculoskeletal system     Problem List Patient Active Problem List   Diagnosis Date Noted  . Common migraine 12/23/2014  . Hx of adenomatous colonic polyps 05/09/2014  . Obesity (BMI 30-39.9) 07/30/2013  . Type  2 diabetes mellitus with circulatory disorder ([place]) 05/27/2011  . Hyperlipidemia associated with type 2 diabetes mellitus ([place]) 10/19/2008  . Essential hypertension, benign 10/19/2008  . CORONARY ATHEROSCLEROSIS NATIVE CORONARY ARTERY 10/17/2008  . GERD (gastroesophageal reflux disease) 12/27/2007    Deniece Ree PT, DPT [facility] [address] [place], [place], [address] Phone: [phone]   Fax:  [phone]  Name: KEEVON SLATER MRN: [id number] Date of Birth: May 31, 1940

## 2016-01-06 ENCOUNTER — Ambulatory Visit (HOSPITAL_COMMUNITY): Payer: Medicare Other

## 2016-01-06 ENCOUNTER — Other Ambulatory Visit: Payer: Self-pay | Admitting: Family Medicine

## 2016-01-06 DIAGNOSIS — R293 Abnormal posture: Secondary | ICD-10-CM

## 2016-01-06 DIAGNOSIS — M25612 Stiffness of left shoulder, not elsewhere classified: Secondary | ICD-10-CM | POA: Diagnosis not present

## 2016-01-06 DIAGNOSIS — R29898 Other symptoms and signs involving the musculoskeletal system: Secondary | ICD-10-CM

## 2016-01-06 DIAGNOSIS — M25512 Pain in left shoulder: Secondary | ICD-10-CM

## 2016-01-06 NOTE — Therapy (Signed)
[facility] [place], [place], [address] Phone: [phone]   Fax:  [phone]  Physical Therapy Treatment  Patient Details  Name: [name] MRN: [id number] Date of Birth: 1940-02-09 No Data Recorded  Encounter Date: 01/06/2016      PT End of Session - 01/06/16 1727    Visit Number 6   Number of Visits 18   Date for PT Re-Evaluation 01/08/16   Authorization Type UHC Medicare    Authorization Time Period 12/18/15 to 01/29/16   Authorization - Visit Number 6   Authorization - Number of Visits 10   PT Start Time [id number]   PT Stop Time 1732   PT Time Calculation (min) 39 min   Activity Tolerance Patient tolerated treatment well   Behavior During Therapy [facility] for tasks assessed/performed      Past Medical History:  Diagnosis Date  . Acute encephalopathy 08/2010  . Adenomatous polyp   . Anxiety disorder   . Arthritis   . Chronic headaches   . Chronic headaches   . Common migraine 12/23/2014  . Coronary atherosclerosis of native coronary artery    BMS nondominant RCA 12/2004  . Diverticulosis   . Essential hypertension, benign   . Gallbladder cholesterolosis   . GERD (gastroesophageal reflux disease)   . Hyperlipidemia   . Internal hemorrhoids   . Myocardial infarction    2006/1 stent  . Nephrolithiasis   . Shoulder pain, right   . Type 2 diabetes mellitus ([place])     Past Surgical History:  Procedure Laterality Date  . CATARACT EXTRACTION    . CHOLECYSTECTOMY    . COLONOSCOPY  09/10/2008   normal  . CYSTOSCOPY W/ URETEROSCOPY W/ LITHOTRIPSY  08/2010  . JOINT REPLACEMENT     right hip  . LEFT HEART CATHETERIZATION WITH CORONARY ANGIOGRAM N/A 05/14/2011   Procedure: LEFT HEART CATHETERIZATION WITH CORONARY ANGIOGRAM;  Surgeon: Sherren Mocha, MD;  Location[facility] CATH LAB;  Service: Cardiovascular;  Laterality: N/A;  . ROTATOR CUFF REPAIR     right  . TOTAL KNEE ARTHROPLASTY     Bil  . UPPER  GASTROINTESTINAL ENDOSCOPY  01/08/2008   bx, inlet patch, duodenitis  . YAG LASER APPLICATION Right [id number]   Procedure: YAG LASER APPLICATION;  Surgeon: Williams Che, MD;  Location: [facility];  Service: Ophthalmology;  Laterality: Right;    There were no vitals filed for this visit.      Subjective Assessment - 01/06/16 1657    Subjective Pt stated current pain scale 3/10.  Reports he has most difficulty with laying still or sitting up for long periods of time.  Reports he had a good night sleep last night.     Pertinent History HTN, DM, GERD, anxiety, MI, cardiac cath, R hip replacement, R RCR repair    Patient Stated Goals reduce pain, get L shoulder working well again    Currently in Pain? Yes   Pain Score 3    Pain Location Shoulder   Pain Orientation Left   Pain Descriptors / Indicators Aching   Pain Type Surgical pain   Pain Onset 1 to 4 weeks ago   Pain Frequency Intermittent   Aggravating Factors  Reports he has most difficulty with laying still or sitting up for long periods of time   Pain Relieving Factors nothing   Effect of Pain on Daily Activities severe limitation  [facility] Adult PT Treatment/Exercise - 01/06/16 0001      Shoulder Exercises: Supine   External Rotation PROM;15 reps   Flexion PROM;15 reps   Flexion Limitations to approx 120 degrees per MD protocol, distractions provided between reps    ABduction PROM;15 reps     Shoulder Exercises: Seated   Retraction Both;10 reps   Theraband Level (Shoulder Retraction) Other (comment)  no resistance; cervical and scapular   Other Seated Exercises postural cues, keeping head up, seated upright; cervical and scapular retraction 2x 10 sets   Other Seated Exercises elbow flexion 20 reps     Manual Therapy   Manual Therapy Myofascial release;Passive ROM   Manual therapy comments completed seperately from all other skilled interventions   Myofascial Release gentle, to Lt  shoulder to increase mobility, decrease adhesions   Passive ROM Lt shoulder PROM flexion, abd and ER per pt tolerance                  PT Short Term Goals - 12/18/15 1352      PT SHORT TERM GOAL #1   Title Patient to demonstrate L shoulder flexion of 120 degrees and ABD of 110 degrees in order to show improvement of area per protocol and to reduce pain    Time 3   Period Weeks   Status New     PT SHORT TERM GOAL #2   Title Patient to demonstrate L shoulder IR as unlimited and L shoulder ER of 45 degrees in order to show progression per protocol and to reduce pain    Time 3   Period Weeks   Status New     PT SHORT TERM GOAL #3   Title Patient to be able to maintain correct posture at least 75% of the time in order to correct muscle imbalances and reduce pain    Time 3   Period Weeks   Status New     PT SHORT TERM GOAL #4   Title Patient to be independent in correctly and consistently performing appropriate HEP, to be updated PRN    Time 2   Period Weeks   Status New           PT Long Term Goals - 12/18/15 1354      PT LONG TERM GOAL #1   Title Patient to demonstrate L shoulder ROM for flexion, ABD, IR, and ER as being full and unlimited to reduce pain and improve functional task performance    Time 6   Period Weeks   Status New     PT LONG TERM GOAL #2   Title Patient to demonstrate functional strength in all tested groups as being 4/5 in order to improve functional task performance skills    Time 6   Period Weeks   Status New     PT LONG TERM GOAL #3   Title Patient to experience pain no more than 2/10 in order to improve functional and QOL    Time 6   Period Weeks   Status New     PT LONG TERM GOAL #4   Title Patient to be independent in all ADLs and self-care based activities in order to improve independence and functional performance    Time 6   Period Weeks   Status New     PT LONG TERM GOAL #5   Title Patient to demonstrate full cervical  ROM in order to improve functional task performance and to assist in maintaining pain reduction  Time 6   Period Weeks   Status New               Plan - 01/06/16 1829    Clinical Impression Statement Continued session focus with shoulder PROM per MD protocol 0-4 weeks as well as postural strengthening.  Pt progressing well with good tolerance with manual and end range ROM, feel ROM within MD goals currently.  Min cueing for proper form and correct mm activation with postural strengtheing (cueing to improve form with cervical retraction and relax upper traps).  EOS pt reports pain reduced to 0-1/10.   Rehab Potential Good   Clinical Impairments Affecting Rehab Potential extensive surgical procedure    PT Frequency 3x / week   PT Duration 6 weeks   PT Treatment/Interventions ADLs/Self Care Home Management;Biofeedback;Cryotherapy;Moist Heat;Functional mobility training;Therapeutic activities;Therapeutic exercise;Balance training;Neuromuscular re-education;Patient/family education;Manual techniques;Passive range of motion;Energy conservation;Dry needling;Taping   PT Next Visit Plan F/U with MD apt later this week.  Continue wiht Dr Collin's protocl for progress due to RCR aspect of surgery.  Can progress to weeks 4-6 on 12/28 including AAROM of shoulder, UBE, pullies, wall ladder, supine wand flexion/protraction, scap stab in prone, elbow ex with resistance, d/c sling   PT Home Exercise Plan Eval: 3D cervical excursions, scapular retractions, shoulder pendulums       Patient will benefit from skilled therapeutic intervention in order to improve the following deficits and impairments:  Decreased skin integrity, Improper body mechanics, Pain, Decreased mobility, Postural dysfunction, Decreased range of motion, Decreased strength, Hypomobility, Impaired flexibility  Visit Diagnosis: Acute pain of left shoulder  Stiffness of left shoulder, not elsewhere classified  Abnormal  posture  Other symptoms and signs involving the musculoskeletal system     Problem List Patient Active Problem List   Diagnosis Date Noted  . Common migraine 12/23/2014  . Hx of adenomatous colonic polyps 05/09/2014  . Obesity (BMI 30-39.9) 07/30/2013  . Type 2 diabetes mellitus with circulatory disorder ([place]) 05/27/2011  . Hyperlipidemia associated with type 2 diabetes mellitus ([place]) 10/19/2008  . Essential hypertension, benign 10/19/2008  . CORONARY ATHEROSCLEROSIS NATIVE CORONARY ARTERY 10/17/2008  . GERD (gastroesophageal reflux disease) 12/27/2007   Ihor Austin, LPTA; CBIS [phone]  Aldona Lento 01/06/2016, 6:37 PM  [facility] [place], [place], [address] Phone: [phone]   Fax:  [phone]  Name: [name] MRN: [id number] Date of Birth: 1940-05-16

## 2016-01-08 ENCOUNTER — Ambulatory Visit (HOSPITAL_COMMUNITY): Payer: Medicare Other | Admitting: Physical Therapy

## 2016-01-08 DIAGNOSIS — R29898 Other symptoms and signs involving the musculoskeletal system: Secondary | ICD-10-CM

## 2016-01-08 DIAGNOSIS — M25512 Pain in left shoulder: Secondary | ICD-10-CM | POA: Diagnosis not present

## 2016-01-08 DIAGNOSIS — R293 Abnormal posture: Secondary | ICD-10-CM | POA: Diagnosis not present

## 2016-01-08 DIAGNOSIS — M25612 Stiffness of left shoulder, not elsewhere classified: Secondary | ICD-10-CM | POA: Diagnosis not present

## 2016-01-08 NOTE — Therapy (Signed)
[place], [place], [address] Phone: [phone]   Fax:  [phone]  Physical Therapy Treatment/Reassessment  Patient Details  Name: [name] MRN: [id number] Date of Birth: 04-01-1940 Referring Provider: Sydnee Cabal, MD  Encounter Date: 01/08/2016      PT End of Session - 01/08/16 0837    Visit Number 7   Number of Visits 18   Date for PT Re-Evaluation 01/29/16   Authorization Type [employer] Medicare (G-codes updated 7th visit)   Authorization Time Period 12/18/15 to 01/29/16   Authorization - Visit Number 7   Authorization - Number of Visits 10   PT Start Time 0815   PT Stop Time 0900   PT Time Calculation (min) 45 min   Activity Tolerance Patient tolerated treatment well;No increased pain   Behavior During Therapy WFL for tasks assessed/performed      Past Medical History:  Diagnosis Date  . Acute encephalopathy 08/2010  . Adenomatous polyp   . Anxiety disorder   . Arthritis   . Chronic headaches   . Chronic headaches   . Common migraine 12/23/2014  . Coronary atherosclerosis of native coronary artery    BMS nondominant RCA 12/2004  . Diverticulosis   . Essential hypertension, benign   . Gallbladder cholesterolosis   . GERD (gastroesophageal reflux disease)   . Hyperlipidemia   . Internal hemorrhoids   . Myocardial infarction    2006/1 stent  . Nephrolithiasis   . Shoulder pain, right   . Type 2 diabetes mellitus ([place])     Past Surgical History:  Procedure Laterality Date  . CATARACT EXTRACTION    . CHOLECYSTECTOMY    . COLONOSCOPY  09/10/2008   normal  . CYSTOSCOPY W/ URETEROSCOPY W/ LITHOTRIPSY  08/2010  . JOINT REPLACEMENT     right hip  . LEFT HEART CATHETERIZATION WITH CORONARY ANGIOGRAM N/A 05/14/2011   Procedure: LEFT HEART CATHETERIZATION WITH CORONARY ANGIOGRAM;  Surgeon: Sherren Mocha, MD;  Location: [facility] CATH LAB;  Service: Cardiovascular;  Laterality: N/A;  . ROTATOR CUFF  REPAIR     right  . TOTAL KNEE ARTHROPLASTY     Bil  . UPPER GASTROINTESTINAL ENDOSCOPY  01/08/2008   bx, inlet patch, duodenitis  . YAG LASER APPLICATION Right [id number]   Procedure: YAG LASER APPLICATION;  Surgeon: Williams Che, MD;  Location: [facility];  Service: Ophthalmology;  Laterality: Right;    There were no vitals filed for this visit.      Subjective Assessment - 01/08/16 0818    Subjective Pt states that his Lt shoulder is a little sore today following his last session. It's pretty good right now though. He feels that his sling makes it hurt more than anything. He reports that his exercises are going ok, but he only does them "some". He feels that he is still doing pretty good though.    Pertinent History HTN, DM, GERD, anxiety, MI, cardiac cath, R hip replacement, R RCR repair    How long can you sit comfortably? unlimited    How long can you stand comfortably? unlimited    How long can you walk comfortably? unlimited    Patient Stated Goals reduce pain, get L shoulder working well again    Currently in Pain? No/denies  None sitting here right now   Pain Onset 1 to 4 weeks ago   Aggravating Factors  sleeping            [facility] PT Assessment -  01/08/16 0001      Assessment   Medical Diagnosis Lt shoulder repair   Referring Provider Sydnee Cabal, MD   Onset Date/Surgical Date 12/11/15   Next MD Visit 01/09/16   Prior Therapy none      Precautions   Precaution Comments Wearing sling on LUE     Balance Screen   Has the patient fallen in the past 6 months No   Has the patient had a decrease in activity level because of a fear of falling?  No   Is the patient reluctant to leave their home because of a fear of falling?  No     Sensation   Light Touch Appears Intact     Posture/Postural Control   Posture/Postural Control Postural limitations   Postural Limitations Rounded Shoulders;Forward head;Flexed trunk;Increased thoracic kyphosis     AROM   Left  Shoulder Flexion 130 Degrees  PROM, pain free   Left Shoulder ABduction 150 Degrees  PROM, pain free   Left Shoulder External Rotation 50 Degrees  PROM at 30 deg abd      Strength   Right Elbow Flexion 5/5   Right Elbow Extension 5/5   Left Elbow Flexion 5/5   Left Elbow Extension 4+/5   Right Wrist Flexion 4+/5   Right Wrist Extension 4+/5   Left Wrist Flexion 4+/5   Left Wrist Extension 4+/5                     [facility] Adult PT Treatment/Exercise - 01/08/16 0001      Shoulder Exercises: Supine   External Rotation AAROM;Left;15 reps   Flexion 15 reps;AAROM;Left   ABduction AAROM;Left;15 reps;Other (comment)  tactile assistance required to perform correctly.      Shoulder Exercises: Standing   Other Standing Exercises LUE isometrics (elbow bent) 5x10 sec each: flex, abd, IR, ER, ext                PT Education - 01/08/16 0834    Education provided Yes   Education Details noted progress, importance of full HEP adherence; updated HEP   Person(s) Educated Patient   Methods Explanation;Demonstration;Handout   Comprehension Verbalized understanding;Returned demonstration          PT Short Term Goals - 01/08/16 0830      PT SHORT TERM GOAL #1   Title Patient to demonstrate L shoulder flexion of 120 degrees and ABD of 110 degrees in order to show improvement of area per protocol and to reduce pain    Time 3   Period Weeks   Status Achieved     PT SHORT TERM GOAL #2   Title Patient to demonstrate L shoulder IR as unlimited and L shoulder ER of 45 degrees in order to show progression per protocol and to reduce pain    Time 3   Period Weeks   Status Achieved     PT SHORT TERM GOAL #3   Title Patient to be able to maintain correct posture at least 75% of the time in order to correct muscle imbalances and reduce pain    Time 3   Period Weeks   Status New     PT SHORT TERM GOAL #4   Title Patient to be independent in correctly and consistently  performing appropriate HEP, to be updated PRN    Time 2   Period Weeks   Status New           PT Long Term Goals - 12/18/15  [place] #1   Title Patient to demonstrate L shoulder ROM for flexion, ABD, IR, and ER as being full and unlimited to reduce pain and improve functional task performance    Time 6   Period Weeks   Status New     PT LONG TERM GOAL #2   Title Patient to demonstrate functional strength in all tested groups as being 4/5 in order to improve functional task performance skills    Time 6   Period Weeks   Status New     PT LONG TERM GOAL #3   Title Patient to experience pain no more than 2/10 in order to improve functional and QOL    Time 6   Period Weeks   Status New     PT LONG TERM GOAL #4   Title Patient to be independent in all ADLs and self-care based activities in order to improve independence and functional performance    Time 6   Period Weeks   Status New     PT LONG TERM GOAL #5   Title Patient to demonstrate full cervical ROM in order to improve functional task performance and to assist in maintaining pain reduction    Time 6   Period Weeks   Status New               Plan - Jan 27, 2016 0906    Clinical Impression Statement Pt was reassessed this visit having made good progress with shoulder ROM, demonstrating atleast 120+ degrees of passive Lt shoulder flexion/abduction. Shoulder ER is atleast 45deg as well. Introduced AAROM exercises this visit per MD protocol and pt was able to perform shoulder ER/flexion without difficulty. He did have significant difficulty understanding and performing shoulder abduction AAROM, requiring increased verbal/tactile cues for proper technique. This was not given as his HEP due to poor technique. Otherwise his HEP was updated and he was encouraged to perform daily to facilitate continued progression of ROM/strength. He verbalized understanding.    Rehab Potential Good   Clinical Impairments  Affecting Rehab Potential extensive surgical procedure    PT Frequency 3x / week   PT Duration 6 weeks   PT Treatment/Interventions ADLs/Self Care Home Management;Biofeedback;Cryotherapy;Moist Heat;Functional mobility training;Therapeutic activities;Therapeutic exercise;Balance training;Neuromuscular re-education;Patient/family education;Manual techniques;Passive range of motion;Energy conservation;Dry needling;Taping   PT Next Visit Plan Can progress to weeks 4-6 AAROM of shoulder, UBE, pullies, wall ladder, supine wand flexion/protraction, scap stab in prone, elbow ex with resistance, d/c sling   PT Home Exercise Plan shoulder isometrics all directions 5x10 sec each, flexion AAROM in supine    Consulted and Agree with Plan of Care Patient      Patient will benefit from skilled therapeutic intervention in order to improve the following deficits and impairments:  Decreased skin integrity, Improper body mechanics, Pain, Decreased mobility, Postural dysfunction, Decreased range of motion, Decreased strength, Hypomobility, Impaired flexibility  Visit Diagnosis: Acute pain of left shoulder  Stiffness of left shoulder, not elsewhere classified  Abnormal posture  Other symptoms and signs involving the musculoskeletal system       G-Codes - 2016/01/27 [id number]    Functional Assessment Tool Used Clinical judgement based on assessment of ROM, strength and activity tolerance.    Functional Limitation Mobility: Walking and moving around   Mobility: Walking and Moving Around Current Status [phone]) At least 40 percent but less than 60 percent impaired, limited or restricted   Mobility: Walking and Moving Around Goal Status [id number]) At  least 40 percent but less than 60 percent impaired, limited or restricted      Problem List Patient Active Problem List   Diagnosis Date Noted  . Common migraine 12/23/2014  . Hx of adenomatous colonic polyps 05/09/2014  . Obesity (BMI 30-39.9) 07/30/2013  . Type 2  diabetes mellitus with circulatory disorder ([place]) 05/27/2011  . Hyperlipidemia associated with type 2 diabetes mellitus ([place]) 10/19/2008  . Essential hypertension, benign 10/19/2008  . CORONARY ATHEROSCLEROSIS NATIVE CORONARY ARTERY 10/17/2008  . GERD (gastroesophageal reflux disease) 12/27/2007    9:27 AM,01/08/16 Elly Modena PT, DPT [facility] [address] [place], [place], [address] Phone: [phone]   Fax:  [phone]  Name: [name] MRN: [id number] Date of Birth: November 14, 1940

## 2016-01-14 ENCOUNTER — Ambulatory Visit (HOSPITAL_COMMUNITY): Payer: Medicare Other | Attending: Specialist | Admitting: Physical Therapy

## 2016-01-14 DIAGNOSIS — R29898 Other symptoms and signs involving the musculoskeletal system: Secondary | ICD-10-CM | POA: Diagnosis not present

## 2016-01-14 DIAGNOSIS — R293 Abnormal posture: Secondary | ICD-10-CM | POA: Diagnosis not present

## 2016-01-14 DIAGNOSIS — M25612 Stiffness of left shoulder, not elsewhere classified: Secondary | ICD-10-CM

## 2016-01-14 DIAGNOSIS — M25512 Pain in left shoulder: Secondary | ICD-10-CM

## 2016-01-14 NOTE — Therapy (Signed)
[place], [place], [address] Phone: [phone]   Fax:  [phone]  Physical Therapy Treatment  Patient Details  Name: [name] MRN: [id number] Date of Birth: 1940-08-26 Referring Provider: Sydnee Cabal, MD  Encounter Date: 01/14/2016      PT End of Session - 01/14/16 1116    Visit Number 8   Number of Visits 18   Date for PT Re-Evaluation 01/29/16   Authorization Type [employer] Medicare (G-codes updated 7th visit)   Authorization Time Period 12/18/15 to 01/29/16   Authorization - Visit Number 8   Authorization - Number of Visits 10   PT Start Time 0949   PT Stop Time 1029   PT Time Calculation (min) 40 min   Activity Tolerance Patient tolerated treatment well   Behavior During Therapy [facility] for tasks assessed/performed      Past Medical History:  Diagnosis Date  . Acute encephalopathy 08/2010  . Adenomatous polyp   . Anxiety disorder   . Arthritis   . Chronic headaches   . Chronic headaches   . Common migraine 12/23/2014  . Coronary atherosclerosis of native coronary artery    BMS nondominant RCA 12/2004  . Diverticulosis   . Essential hypertension, benign   . Gallbladder cholesterolosis   . GERD (gastroesophageal reflux disease)   . Hyperlipidemia   . Internal hemorrhoids   . Myocardial infarction    2006/1 stent  . Nephrolithiasis   . Shoulder pain, right   . Type 2 diabetes mellitus ([place])     Past Surgical History:  Procedure Laterality Date  . CATARACT EXTRACTION    . CHOLECYSTECTOMY    . COLONOSCOPY  09/10/2008   normal  . CYSTOSCOPY W/ URETEROSCOPY W/ LITHOTRIPSY  08/2010  . JOINT REPLACEMENT     right hip  . LEFT HEART CATHETERIZATION WITH CORONARY ANGIOGRAM N/A 05/14/2011   Procedure: LEFT HEART CATHETERIZATION WITH CORONARY ANGIOGRAM;  Surgeon: Sherren Mocha, MD;  Location: [facility] CATH LAB;  Service: Cardiovascular;  Laterality: N/A;  . ROTATOR CUFF REPAIR     right  . TOTAL KNEE  ARTHROPLASTY     Bil  . UPPER GASTROINTESTINAL ENDOSCOPY  01/08/2008   bx, inlet patch, duodenitis  . YAG LASER APPLICATION Right [id number]   Procedure: YAG LASER APPLICATION;  Surgeon: Williams Che, MD;  Location: [facility];  Service: Ophthalmology;  Laterality: Right;    There were no vitals filed for this visit.      Subjective Assessment - 01/14/16 0952    Subjective Patient arrives frustrated with his insurance due to the changes for the new year with his insurance. His shoulder is feeling OK this morning, it continues to hurt more with the sling on. He went to his MD last Friday and reports the MD wants him to wear the sling another 2 weeks or so.    Pertinent History HTN, DM, GERD, anxiety, MI, cardiac cath, R hip replacement, R RCR repair    Patient Stated Goals reduce pain, get L shoulder working well again    Currently in Pain? Yes   Pain Score 2    Pain Location Shoulder   Pain Orientation Left   Pain Descriptors / Indicators Stabbing   Pain Type Surgical pain   Pain Radiating Towards down into L UE    Pain Onset More than a month ago   Pain Frequency Intermittent   Aggravating Factors  putting seatbelts on, moving certain ways    Pain  Relieving Factors getting out of sling    Effect of Pain on Daily Activities significant limitations                          [facility] Adult PT Treatment/Exercise - 01/14/16 0001      Shoulder Exercises: Supine   External Rotation AAROM   Flexion 15 reps;AAROM;Left   Flexion Limitations 5 second holds    ABduction AAROM;10 reps;Other (comment)  Min assist for correct form      Shoulder Exercises: Seated   Retraction 15 reps   Theraband Level (Shoulder Retraction) --  3 second holds    Protraction Left;10 reps   Protraction Limitations UE supported by PT      Shoulder Exercises: Standing   Other Standing Exercises ball ABCs on table x1    Other Standing Exercises light weight shifts on table x2 minutes UEs       Manual Therapy   Manual Therapy Soft tissue mobilization   Manual therapy comments completed seperately from all other skilled interventions   Myofascial Release anterior L shoulder to address muscle spasm                 PT Education - 01/14/16 1116    Education provided Yes   Education Details HEP update    Person(s) Educated Patient   Methods Explanation   Comprehension Verbalized understanding          PT Short Term Goals - 01/08/16 0830      PT SHORT TERM GOAL #1   Title Patient to demonstrate L shoulder flexion of 120 degrees and ABD of 110 degrees in order to show improvement of area per protocol and to reduce pain    Time 3   Period Weeks   Status Achieved     PT SHORT TERM GOAL #2   Title Patient to demonstrate L shoulder IR as unlimited and L shoulder ER of 45 degrees in order to show progression per protocol and to reduce pain    Time 3   Period Weeks   Status Achieved     PT SHORT TERM GOAL #3   Title Patient to be able to maintain correct posture at least 75% of the time in order to correct muscle imbalances and reduce pain    Time 3   Period Weeks   Status New     PT SHORT TERM GOAL #4   Title Patient to be independent in correctly and consistently performing appropriate HEP, to be updated PRN    Time 2   Period Weeks   Status New           PT Long Term Goals - 12/18/15 1354      PT LONG TERM GOAL #1   Title Patient to demonstrate L shoulder ROM for flexion, ABD, IR, and ER as being full and unlimited to reduce pain and improve functional task performance    Time 6   Period Weeks   Status New     PT LONG TERM GOAL #2   Title Patient to demonstrate functional strength in all tested groups as being 4/5 in order to improve functional task performance skills    Time 6   Period Weeks   Status New     PT LONG TERM GOAL #3   Title Patient to experience pain no more than 2/10 in order to improve functional and QOL    Time 6   Period  Weeks   Status New     PT LONG TERM GOAL #4   Title Patient to be independent in all ADLs and self-care based activities in order to improve independence and functional performance    Time 6   Period Weeks   Status New     PT LONG TERM GOAL #5   Title Patient to demonstrate full cervical ROM in order to improve functional task performance and to assist in maintaining pain reduction    Time 6   Period Weeks   Status New               Plan - 01/14/16 1117    Clinical Impression Statement Continued with AAROM exercises today with verbal and tactile cues to ensure correct form this session; also continued with scapular stabilization exercises and gentle dynamic stability exercises this session as well within patient tolerance. Patient doing well overall but does require cues throughout session for form. Also performed manual to anterior L shoulder due to pain with AAROM, noticing significant ROM improvement after manual with AAROM based activities.    Rehab Potential Good   Clinical Impairments Affecting Rehab Potential extensive surgical procedure    PT Frequency 3x / week   PT Duration 6 weeks   PT Treatment/Interventions ADLs/Self Care Home Management;Biofeedback;Cryotherapy;Moist Heat;Functional mobility training;Therapeutic activities;Therapeutic exercise;Balance training;Neuromuscular re-education;Patient/family education;Manual techniques;Passive range of motion;Energy conservation;Dry needling;Taping   PT Next Visit Plan Can progress to weeks 4-6 AAROM of shoulder, UBE, pullies, wall ladder, supine wand flexion/protraction, scap stab in prone, elbow ex with resistance, d/c sling   PT Home Exercise Plan shoulder isometrics all directions 5x10 sec each, flexion AAROM in supine    Consulted and Agree with Plan of Care Patient      Patient will benefit from skilled therapeutic intervention in order to improve the following deficits and impairments:  Decreased skin integrity,  Improper body mechanics, Pain, Decreased mobility, Postural dysfunction, Decreased range of motion, Decreased strength, Hypomobility, Impaired flexibility  Visit Diagnosis: Acute pain of left shoulder  Stiffness of left shoulder, not elsewhere classified  Abnormal posture  Other symptoms and signs involving the musculoskeletal system     Problem List Patient Active Problem List   Diagnosis Date Noted  . Common migraine 12/23/2014  . Hx of adenomatous colonic polyps 05/09/2014  . Obesity (BMI 30-39.9) 07/30/2013  . Type 2 diabetes mellitus with circulatory disorder ([place]) 05/27/2011  . Hyperlipidemia associated with type 2 diabetes mellitus ([place]) 10/19/2008  . Essential hypertension, benign 10/19/2008  . CORONARY ATHEROSCLEROSIS NATIVE CORONARY ARTERY 10/17/2008  . GERD (gastroesophageal reflux disease) 12/27/2007    Deniece Ree PT, DPT [place] [address] [place], [place], [address] Phone: [phone]   Fax:  [phone]  Name: [name] MRN: [id number] Date of Birth: [birth year]

## 2016-01-14 NOTE — Patient Instructions (Signed)
   BICEP CURLS  With your arm at your side, draw up your hand by bending at the elbow.   Keep your palm face up the entire time.  You may use a can of food to give yourself some resistance for this exercises. Make sure you are keeping your shoulder at your side, only the elbow should move.  Repeat 10-15 times, 2 times a day.    WEIGHT SHIFT - UPPER EXTREMITY - WALL  Place your hands on a wall and lean your body weight into your arms. Next, shift your weight side to side.   Keep your elbows straight the entire time.  Repeat 20 times, twice a day.

## 2016-01-16 ENCOUNTER — Ambulatory Visit (HOSPITAL_COMMUNITY): Payer: Medicare Other

## 2016-01-16 DIAGNOSIS — R293 Abnormal posture: Secondary | ICD-10-CM

## 2016-01-16 DIAGNOSIS — M25512 Pain in left shoulder: Secondary | ICD-10-CM

## 2016-01-16 DIAGNOSIS — R29898 Other symptoms and signs involving the musculoskeletal system: Secondary | ICD-10-CM | POA: Diagnosis not present

## 2016-01-16 DIAGNOSIS — M25612 Stiffness of left shoulder, not elsewhere classified: Secondary | ICD-10-CM | POA: Diagnosis not present

## 2016-01-16 NOTE — Therapy (Signed)
[facility] [place], [place], [address] Phone: [phone]   Fax:  [phone]  Physical Therapy Treatment  Patient Details  Name: [name] MRN: [id number] Date of Birth: 1940-12-05 Referring Provider: Sydnee Cabal, MD  Encounter Date: 01/16/2016      PT End of Session - 01/16/16 0902    Visit Number 9   Number of Visits 18   Date for PT Re-Evaluation 01/29/16   Authorization Type U[employer] Medicare (G-codes updated 7th visit)   Authorization Time Period 12/18/15 to 01/29/16   Authorization - Visit Number 9   Authorization - Number of Visits 10   PT Start Time [id number]   PT Stop Time 0948   PT Time Calculation (min) 50 min   Activity Tolerance Patient tolerated treatment well   Behavior During Therapy [facility] for tasks assessed/performed      Past Medical History:  Diagnosis Date  . Acute encephalopathy 08/2010  . Adenomatous polyp   . Anxiety disorder   . Arthritis   . Chronic headaches   . Chronic headaches   . Common migraine 12/23/2014  . Coronary atherosclerosis of native coronary artery    BMS nondominant RCA 12/2004  . Diverticulosis   . Essential hypertension, benign   . Gallbladder cholesterolosis   . GERD (gastroesophageal reflux disease)   . Hyperlipidemia   . Internal hemorrhoids   . Myocardial infarction    2006/1 stent  . Nephrolithiasis   . Shoulder pain, right   . Type 2 diabetes mellitus ([place])     Past Surgical History:  Procedure Laterality Date  . CATARACT EXTRACTION    . CHOLECYSTECTOMY    . COLONOSCOPY  09/10/2008   normal  . CYSTOSCOPY W/ URETEROSCOPY W/ LITHOTRIPSY  08/2010  . JOINT REPLACEMENT     right hip  . LEFT HEART CATHETERIZATION WITH CORONARY ANGIOGRAM N/A 05/14/2011   Procedure: LEFT HEART CATHETERIZATION WITH CORONARY ANGIOGRAM;  Surgeon: Sherren Mocha, MD;  Location: [facility] CATH LAB;  Service: Cardiovascular;  Laterality: N/A;  . ROTATOR CUFF REPAIR     right  . TOTAL KNEE  ARTHROPLASTY     Bil  . UPPER GASTROINTESTINAL ENDOSCOPY  01/08/2008   bx, inlet patch, duodenitis  . YAG LASER APPLICATION Right [id number]   Procedure: YAG LASER APPLICATION;  Surgeon: Williams Che, MD;  Location: [facility];  Service: Ophthalmology;  Laterality: Right;    There were no vitals filed for this visit.      Subjective Assessment - 01/16/16 0900    Subjective Pt stated his shoulder is feeling good as long as he doesn't make the wrong movement.  Reports increased ease with sleeping at night.     Pertinent History HTN, DM, GERD, anxiety, MI, cardiac cath, R hip replacement, R RCR repair    Patient Stated Goals reduce pain, get L shoulder working well again    Currently in Pain? Yes   Pain Score 2    Pain Location Shoulder   Pain Orientation Left   Pain Descriptors / Indicators Sore   Pain Type Surgical pain   Pain Radiating Towards no radicular symptoms today   Pain Onset More than a month ago   Pain Frequency Intermittent   Aggravating Factors  putting seatbelts on, moving certain ways, putting on sleeve on Lt UE   Pain Relieving Factors getting out of sling   Effect of Pain on Daily Activities significant limitation  [facility] Adult PT Treatment/Exercise - 01/16/16 0001      Shoulder Exercises: Supine   Protraction AAROM;Both;10 reps  protract/retract with min-mod A for proper form   External Rotation AAROM;15 reps  cueing for form   Flexion 15 reps;AAROM;Left   Shoulder Flexion Weight (lbs) wand   ABduction AAROM;10 reps;Other (comment)   ABduction Limitations Min A for proper form, achieved 123 degrees     Shoulder Exercises: Standing   Other Standing Exercises wall ladder for flexion and abductin 5reps each     Shoulder Exercises: Pulleys   Flexion 2 minutes   ABduction 2 minutes   ABduction Limitations cueing for form     Shoulder Exercises: ROM/Strengthening   UBE (Upper Arm Bike) 2' forward; 2' backward L1      Manual Therapy   Manual Therapy Soft tissue mobilization   Manual therapy comments completed seperately from all other skilled interventions   Myofascial Release anterior L shoulder to address muscle spasm                   PT Short Term Goals - 01/08/16 0830      PT SHORT TERM GOAL #1   Title Patient to demonstrate L shoulder flexion of 120 degrees and ABD of 110 degrees in order to show improvement of area per protocol and to reduce pain    Time 3   Period Weeks   Status Achieved     PT SHORT TERM GOAL #2   Title Patient to demonstrate L shoulder IR as unlimited and L shoulder ER of 45 degrees in order to show progression per protocol and to reduce pain    Time 3   Period Weeks   Status Achieved     PT SHORT TERM GOAL #3   Title Patient to be able to maintain correct posture at least 75% of the time in order to correct muscle imbalances and reduce pain    Time 3   Period Weeks   Status New     PT SHORT TERM GOAL #4   Title Patient to be independent in correctly and consistently performing appropriate HEP, to be updated PRN    Time 2   Period Weeks   Status New           PT Long Term Goals - 12/18/15 1354      PT LONG TERM GOAL #1   Title Patient to demonstrate L shoulder ROM for flexion, ABD, IR, and ER as being full and unlimited to reduce pain and improve functional task performance    Time 6   Period Weeks   Status New     PT LONG TERM GOAL #2   Title Patient to demonstrate functional strength in all tested groups as being 4/5 in order to improve functional task performance skills    Time 6   Period Weeks   Status New     PT LONG TERM GOAL #3   Title Patient to experience pain no more than 2/10 in order to improve functional and QOL    Time 6   Period Weeks   Status New     PT LONG TERM GOAL #4   Title Patient to be independent in all ADLs and self-care based activities in order to improve independence and functional performance    Time 6    Period Weeks   Status New     PT LONG TERM GOAL #5   Title Patient to demonstrate full cervical ROM  in order to improve functional task performance and to assist in maintaining pain reduction    Time 6   Period Weeks   Status New               Plan - 01/16/16 0940    Clinical Impression Statement Pt at 5 weeks post-op.  Continues session focus on improving shoulder ROM with AAROM per protocol.  Also continued with scapular stabilization exercises and began UBE for strengthening and to improve activity tolerance.  Pt able to tolerate well with AAROM exercises and manual soft tissue techniques to reduce anterior shoulder spasms with no reports of increased pain.  AROM improvements to 142 degrees flexion and abd 123 degrees.     Rehab Potential Good   Clinical Impairments Affecting Rehab Potential extensive surgical procedure    PT Frequency 3x / week   PT Duration 6 weeks   PT Treatment/Interventions ADLs/Self Care Home Management;Biofeedback;Cryotherapy;Moist Heat;Functional mobility training;Therapeutic activities;Therapeutic exercise;Balance training;Neuromuscular re-education;Patient/family education;Manual techniques;Passive range of motion;Energy conservation;Dry needling;Taping   PT Next Visit Plan Can progress to weeks 4-6 AAROM of shoulder, UBE, pullies, wall ladder, supine wand flexion/protraction, scap stab in prone, elbow ex with resistance, d/c sling   PT Home Exercise Plan shoulder isometrics all directions 5x10 sec each, flexion AAROM in supine       Patient will benefit from skilled therapeutic intervention in order to improve the following deficits and impairments:  Decreased skin integrity, Improper body mechanics, Pain, Decreased mobility, Postural dysfunction, Decreased range of motion, Decreased strength, Hypomobility, Impaired flexibility  Visit Diagnosis: Acute pain of left shoulder  Stiffness of left shoulder, not elsewhere classified  Abnormal  posture  Other symptoms and signs involving the musculoskeletal system     Problem List Patient Active Problem List   Diagnosis Date Noted  . Common migraine 12/23/2014  . Hx of adenomatous colonic polyps 05/09/2014  . Obesity (BMI 30-39.9) 07/30/2013  . Type 2 diabetes mellitus with circulatory disorder ([place]) 05/27/2011  . Hyperlipidemia associated with type 2 diabetes mellitus ([place]) 10/19/2008  . Essential hypertension, benign 10/19/2008  . CORONARY ATHEROSCLEROSIS NATIVE CORONARY ARTERY 10/17/2008  . GERD (gastroesophageal reflux disease) 12/27/2007   Ihor Austin, LPTA; CBIS [id number]  Aldona Lento 01/16/2016, 10:30 AM  [place], [place], [address] Phone: [phone]   Fax:  [phone]  Name: [name] MRN: [id number] Date of Birth: 06-07-1940

## 2016-01-19 ENCOUNTER — Ambulatory Visit (HOSPITAL_COMMUNITY): Payer: Medicare Other | Admitting: Physical Therapy

## 2016-01-19 NOTE — Progress Notes (Signed)
Subjective:    Patient ID: [name], male    DOB: 09-Aug-1940, 76 y.o.   MRN: [id number]  HPI 76 year old gentleman here to follow-up chronic issues type 2 diabetes, hypertension, and hyperlipidemia. He had rotator cuff surgery back in December and is now about halfway through outpatient physical therapy. Physical therapy is progressing. His A1c had improved at last visit. It still not at goal but improved. He is on a regimen of insulin as well as metformin and checks his sugars 3-4 times per day. There is a question about whether or not he supposed to take Flomax. He does have significant nocturia so I think the Flomax would be helpful for quality of life.  Patient Active Problem List   Diagnosis Date Noted  . Common migraine 12/23/2014  . Hx of adenomatous colonic polyps 05/09/2014  . Obesity (BMI 30-39.9) 07/30/2013  . Type 2 diabetes mellitus with circulatory disorder ([place]) 05/27/2011  . Hyperlipidemia associated with type 2 diabetes mellitus ([place]) 10/19/2008  . Essential hypertension, benign 10/19/2008  . CORONARY ATHEROSCLEROSIS NATIVE CORONARY ARTERY 10/17/2008  . GERD (gastroesophageal reflux disease) 12/27/2007   Outpatient Encounter Prescriptions as of 01/20/2016  Medication Sig  . acetaminophen (TYLENOL) 650 MG CR tablet Take 650 mg by mouth every 8 (eight) hours as needed for pain.  Marland Kitchen amLODipine (NORVASC) 5 MG tablet TAKE 1 TABLET BY MOUTH  DAILY  . aspirin 81 MG tablet Take 81 mg by mouth every morning.   Marland Kitchen atorvastatin (LIPITOR) 20 MG tablet TAKE 1 TABLET BY MOUTH  DAILY AT 6 PM.  . Blood Glucose Monitoring Suppl (ONE TOUCH ULTRA SYSTEM KIT) W/DEVICE KIT 1 kit by Does not apply route once.  . butalbital-acetaminophen-caffeine (FIORICET, ESGIC) 50-325-40 MG tablet Take 1 tablet by mouth 2 (two) times daily as needed for headache.  . cetirizine (ZYRTEC) 10 MG tablet Take 10 mg by mouth every evening.   . chlorthalidone (HYGROTON) 25 MG tablet TAKE 1 TABLET BY MOUTH   DAILY AS DIRECTED  . Cholecalciferol (VITAMIN D3) 2000 UNITS TABS Take 1 tablet by mouth every morning.   . famotidine (PEPCID) 20 MG tablet Take 1 tablet by mouth two  times daily  . HUMALOG KWIKPEN 100 UNIT/ML KiwkPen INJECT 0-15 UNITS INTO SKIN THREE TIMES DAILY BEFORE  MEALS. 70-150=8 UNITS  151-200= 9 UNITS  . Insulin Pen Needle (B-D ULTRAFINE III SHORT PEN) 31G X 8 MM MISC USE 4 TIMES DAILY DX E11.49  . LANTUS SOLOSTAR 100 UNIT/ML Solostar Pen INJECT SUBCUTANEOUSLY 52  UNITS AT BEDTIME  . losartan (COZAAR) 50 MG tablet Take 1 tablet (50 mg total) by mouth daily.  . metFORMIN (GLUCOPHAGE-XR) 500 MG 24 hr tablet Take 2 tablets by mouth two times daily  . nitroGLYCERIN (NITROSTAT) 0.4 MG SL tablet USE AS DIRECTED AS NEEDED FOR CHEST PAIN  . ONE TOUCH ULTRA TEST test strip 1 each by Other route 3 (three) times daily.  Marland Kitchen PARoxetine (PAXIL) 20 MG tablet TAKE 1 TABLET BY MOUTH  DAILY  . prazosin (MINIPRESS) 1 MG capsule Take 1 capsule by mouth two times daily  . tamsulosin (FLOMAX) 0.4 MG CAPS capsule TAKE ONE CAPSULE BY MOUTH ONCE DAILY   No facility-administered encounter medications on file as of 01/20/2016.       Review of Systems  Constitutional: Negative.   Respiratory: Negative.   Cardiovascular: Negative.   Genitourinary: Positive for frequency.  Neurological: Negative.   Psychiatric/Behavioral: Negative.        Objective:  Physical Exam BP 135/76   Pulse 62   Temp 97 F (36.1 C) (Oral)   Ht _0  (1.676 m)   Wt 220 lb 3.2 oz (99.9 kg)   BMI 35.54 kg/m         Assessment & Plan:  1. Essential hypertension, benign Blood pressure is well controlled on current regimen of amlodipine chlorthalidone. He is unable to take ACE inhibitor  2. Type 2 diabetes mellitus with other circulatory complication, with long-term current use of insulin (HCC) Check A1c today. As noted above had improved at last visit - [facility] DCA Hb A1c Waived  3. Hyperlipidemia associated with type  2 diabetes mellitus ([place]) Lipids were at goal when last checked about 10 months ago. LDL cholesterol was 62 with other parameters within normal limits  Wardell Honour MD

## 2016-01-20 ENCOUNTER — Encounter: Payer: Self-pay | Admitting: Family Medicine

## 2016-01-20 ENCOUNTER — Ambulatory Visit (HOSPITAL_COMMUNITY): Payer: Medicare Other | Admitting: Physical Therapy

## 2016-01-20 ENCOUNTER — Ambulatory Visit (INDEPENDENT_AMBULATORY_CARE_PROVIDER_SITE_OTHER): Payer: Medicare Other | Admitting: Family Medicine

## 2016-01-20 VITALS — BP 135/76 | HR 62 | Temp 97.0°F | Ht 66.0 in | Wt 220.2 lb

## 2016-01-20 DIAGNOSIS — R293 Abnormal posture: Secondary | ICD-10-CM | POA: Diagnosis not present

## 2016-01-20 DIAGNOSIS — M25512 Pain in left shoulder: Secondary | ICD-10-CM

## 2016-01-20 DIAGNOSIS — I10 Essential (primary) hypertension: Secondary | ICD-10-CM

## 2016-01-20 DIAGNOSIS — R29898 Other symptoms and signs involving the musculoskeletal system: Secondary | ICD-10-CM

## 2016-01-20 DIAGNOSIS — Z794 Long term (current) use of insulin: Secondary | ICD-10-CM | POA: Diagnosis not present

## 2016-01-20 DIAGNOSIS — E785 Hyperlipidemia, unspecified: Secondary | ICD-10-CM | POA: Diagnosis not present

## 2016-01-20 DIAGNOSIS — E1159 Type 2 diabetes mellitus with other circulatory complications: Secondary | ICD-10-CM

## 2016-01-20 DIAGNOSIS — E1169 Type 2 diabetes mellitus with other specified complication: Secondary | ICD-10-CM

## 2016-01-20 DIAGNOSIS — M25612 Stiffness of left shoulder, not elsewhere classified: Secondary | ICD-10-CM | POA: Diagnosis not present

## 2016-01-20 DIAGNOSIS — Z1212 Encounter for screening for malignant neoplasm of rectum: Secondary | ICD-10-CM | POA: Diagnosis not present

## 2016-01-20 LAB — BAYER DCA HB A1C WAIVED: HB A1C (BAYER DCA - WAIVED): 8.3 % — ABNORMAL HIGH (ref <7.0)

## 2016-01-20 MED ORDER — ONETOUCH ULTRA BLUE VI STRP: 1.0000 | ORAL_STRIP | Freq: Four times a day (QID) | 1 refills | Status: DC

## 2016-01-20 MED ORDER — ONETOUCH ULTRA BLUE VI STRP
1.0000 | ORAL_STRIP | Freq: Four times a day (QID) | 1 refills | Status: DC
Start: 2016-01-20 — End: 2016-08-22

## 2016-01-20 NOTE — Addendum Note (Signed)
Addended by: Jamelle Haring on: 01/20/2016 09:17 AM   Modules accepted: Orders

## 2016-01-20 NOTE — Therapy (Signed)
[place], [place], [address] Phone: [phone]   Fax:  [phone]  Physical Therapy Treatment  Patient Details  Name: [name] MRN: [id number] Date of Birth: 1940-08-01 Referring Provider: Sydnee Cabal, MD  Encounter Date: 01/20/2016      PT End of Session - 01/20/16 1320    Visit Number 10   Number of Visits 18   Date for PT Re-Evaluation 01/29/16   Authorization Type UHC Medicare (G-codes updated 7th visit)   Authorization Time Period 12/18/15 to 01/29/16   Authorization - Visit Number 10   Authorization - Number of Visits 17   PT Start Time 1122   PT Stop Time 1202   PT Time Calculation (min) 40 min   Activity Tolerance Patient tolerated treatment well   Behavior During Therapy [facility] for tasks assessed/performed      Past Medical History:  Diagnosis Date  . Acute encephalopathy 08/2010  . Adenomatous polyp   . Anxiety disorder   . Arthritis   . Chronic headaches   . Chronic headaches   . Common migraine 12/23/2014  . Coronary atherosclerosis of native coronary artery    BMS nondominant RCA 12/2004  . Diverticulosis   . Essential hypertension, benign   . Gallbladder cholesterolosis   . GERD (gastroesophageal reflux disease)   . Hyperlipidemia   . Internal hemorrhoids   . Myocardial infarction    2006/1 stent  . Nephrolithiasis   . Shoulder pain, right   . Type 2 diabetes mellitus ([place])     Past Surgical History:  Procedure Laterality Date  . CATARACT EXTRACTION    . CHOLECYSTECTOMY    . COLONOSCOPY  09/10/2008   normal  . CYSTOSCOPY W/ URETEROSCOPY W/ LITHOTRIPSY  08/2010  . JOINT REPLACEMENT     right hip  . LEFT HEART CATHETERIZATION WITH CORONARY ANGIOGRAM N/A 05/14/2011   Procedure: LEFT HEART CATHETERIZATION WITH CORONARY ANGIOGRAM;  Surgeon: Sherren Mocha, MD;  Location: [facility] CATH LAB;  Service: Cardiovascular;  Laterality: N/A;  . ROTATOR CUFF REPAIR     right  . ROTATOR  CUFF REPAIR Left 12/11/2015  . TOTAL KNEE ARTHROPLASTY     Bil  . UPPER GASTROINTESTINAL ENDOSCOPY  01/08/2008   bx, inlet patch, duodenitis  . YAG LASER APPLICATION Right [id number]   Procedure: YAG LASER APPLICATION;  Surgeon: Williams Che, MD;  Location: [facility];  Service: Ophthalmology;  Laterality: Right;    There were no vitals filed for this visit.      Subjective Assessment - 01/20/16 1126    Subjective Pt reports no pain with inactivity but begins to hurt with movement up to 6/10 .  States reaching behind his back with bathing is most difficult.  Pt is now sleeping in his bed instead of the recliner and conntinues to wear his sling except with sleeping.    Currently in Pain? No/denies                         [facility] Adult PT Treatment/Exercise - 01/20/16 0001      Shoulder Exercises: Supine   Protraction AAROM;Both;15 reps   Protraction Limitations 2# wand   External Rotation AAROM;15 reps;PROM   Flexion 15 reps;AAROM;Left   Shoulder Flexion Weight (lbs) 2# wand   ABduction AAROM;10 reps;Other (comment)   Shoulder ABduction Weight (lbs) 2# wand     Shoulder Exercises: Seated   Retraction 15 reps     Shoulder  Exercises: Pulleys   Flexion 2 minutes   ABduction 2 minutes   ABduction Limitations cueing for form     Shoulder Exercises: ROM/Strengthening   UBE (Upper Arm Bike) 3' forward; 3' backward L1     Manual Therapy   Manual Therapy Myofascial release;Passive ROM   Manual therapy comments completed seperately from all other skilled interventions   Myofascial Release to anterior shoulder to loosen adhesions and improve ER motion   Passive ROM for ER                PT Education - 01/20/16 1128    Education provided Yes   Education Details instructed to contact MD regarding continuation of wearing sling (per protocol can begin to wean)   Person(s) Educated Patient   Methods Explanation   Comprehension Verbalized understanding           PT Short Term Goals - 01/08/16 0830      PT SHORT TERM GOAL #1   Title Patient to demonstrate L shoulder flexion of 120 degrees and ABD of 110 degrees in order to show improvement of area per protocol and to reduce pain    Time 3   Period Weeks   Status Achieved     PT SHORT TERM GOAL #2   Title Patient to demonstrate L shoulder IR as unlimited and L shoulder ER of 45 degrees in order to show progression per protocol and to reduce pain    Time 3   Period Weeks   Status Achieved     PT SHORT TERM GOAL #3   Title Patient to be able to maintain correct posture at least 75% of the time in order to correct muscle imbalances and reduce pain    Time 3   Period Weeks   Status New     PT SHORT TERM GOAL #4   Title Patient to be independent in correctly and consistently performing appropriate HEP, to be updated PRN    Time 2   Period Weeks   Status New           PT Long Term Goals - 12/18/15 1354      PT LONG TERM GOAL #1   Title Patient to demonstrate L shoulder ROM for flexion, ABD, IR, and ER as being full and unlimited to reduce pain and improve functional task performance    Time 6   Period Weeks   Status New     PT LONG TERM GOAL #2   Title Patient to demonstrate functional strength in all tested groups as being 4/5 in order to improve functional task performance skills    Time 6   Period Weeks   Status New     PT LONG TERM GOAL #3   Title Patient to experience pain no more than 2/10 in order to improve functional and QOL    Time 6   Period Weeks   Status New     PT LONG TERM GOAL #4   Title Patient to be independent in all ADLs and self-care based activities in order to improve independence and functional performance    Time 6   Period Weeks   Status New     PT LONG TERM GOAL #5   Title Patient to demonstrate full cervical ROM in order to improve functional task performance and to assist in maintaining pain reduction    Time 6   Period Weeks    Status New  Plan - 01/20/16 1311    Clinical Impression Statement continued progression with focus on improving Lt shoulder ROM/ AAROM per protocol (collins rotator cuff repair).  Pt with improved ROM overall with ER being most limited.  completed myofascial techniques to Lt shoulder prior to P/ROM with noted improvement and decreased comfort verbalized.  No longer having anterior shoulder spasms, only tightness.  Encouraged patient to contact MD office regarding sling restrictions as he does not return to MD until end of this month.  Pt witll be 6 weeks post op on thursday (1/11).   Rehab Potential Good   Clinical Impairments Affecting Rehab Potential extensive surgical procedure    PT Frequency 3x / week   PT Duration 6 weeks   PT Treatment/Interventions ADLs/Self Care Home Management;Biofeedback;Cryotherapy;Moist Heat;Functional mobility training;Therapeutic activities;Therapeutic exercise;Balance training;Neuromuscular re-education;Patient/family education;Manual techniques;Passive range of motion;Energy conservation;Dry needling;Taping   PT Next Visit Plan Can progress to weeks 4-6 AAROM of shoulder, UBE, pullies, wall ladder, supine wand flexion/protraction, scap stab in prone, elbow ex with resistance, d/c sling if MD approves.   PT Home Exercise Plan shoulder isometrics all directions 5x10 sec each, flexion AAROM in supine       Patient will benefit from skilled therapeutic intervention in order to improve the following deficits and impairments:  Decreased skin integrity, Improper body mechanics, Pain, Decreased mobility, Postural dysfunction, Decreased range of motion, Decreased strength, Hypomobility, Impaired flexibility  Visit Diagnosis: Acute pain of left shoulder  Stiffness of left shoulder, not elsewhere classified  Abnormal posture  Other symptoms and signs involving the musculoskeletal system     Problem List Patient Active Problem List   Diagnosis  Date Noted  . Common migraine 12/23/2014  . Hx of adenomatous colonic polyps 05/09/2014  . Obesity (BMI 30-39.9) 07/30/2013  . Type 2 diabetes mellitus with circulatory disorder ([place]) 05/27/2011  . Hyperlipidemia associated with type 2 diabetes mellitus ([place]) 10/19/2008  . Essential hypertension, benign 10/19/2008  . CORONARY ATHEROSCLEROSIS NATIVE CORONARY ARTERY 10/17/2008  . GERD (gastroesophageal reflux disease) 12/27/2007    Teena Irani 01/20/2016, 1:21 PM  [place], [place], [address] Phone: [phone]   Fax:  [phone]  Name: SAAVAN KERKSTRA MRN: [id number] Date of Birth: 1940-02-02

## 2016-01-21 ENCOUNTER — Ambulatory Visit (HOSPITAL_COMMUNITY): Payer: Medicare Other | Admitting: Physical Therapy

## 2016-01-21 DIAGNOSIS — R29898 Other symptoms and signs involving the musculoskeletal system: Secondary | ICD-10-CM

## 2016-01-21 DIAGNOSIS — M25612 Stiffness of left shoulder, not elsewhere classified: Secondary | ICD-10-CM | POA: Diagnosis not present

## 2016-01-21 DIAGNOSIS — M25512 Pain in left shoulder: Secondary | ICD-10-CM | POA: Diagnosis not present

## 2016-01-21 DIAGNOSIS — R293 Abnormal posture: Secondary | ICD-10-CM

## 2016-01-21 LAB — CMP14+EGFR
A/G RATIO: 1.2 (ref 1.2–2.2)
ALBUMIN: 4 g/dL (ref 3.5–4.8)
ALK PHOS: 66 IU/L (ref 39–117)
ALT: 24 IU/L (ref 0–44)
AST: 21 IU/L (ref 0–40)
BILIRUBIN TOTAL: 0.6 mg/dL (ref 0.0–1.2)
BUN/Creatinine Ratio: 20 (ref 10–24)
BUN: 19 mg/dL (ref 8–27)
CHLORIDE: 97 mmol/L (ref 96–106)
CO2: 28 mmol/L (ref 18–29)
Calcium: 9.7 mg/dL (ref 8.6–10.2)
Creatinine, Ser: 0.94 mg/dL (ref 0.76–1.27)
GFR calc non Af Amer: 78 mL/min/{1.73_m2} (ref >59)
GFR, EST AFRICAN AMERICAN: 91 mL/min/{1.73_m2} (ref >59)
Globulin, Total: 3.3 g/dL (ref 1.5–4.5)
Glucose: 152 mg/dL — ABNORMAL HIGH (ref 65–99)
POTASSIUM: 4.2 mmol/L (ref 3.5–5.2)
SODIUM: 140 mmol/L (ref 134–144)
TOTAL PROTEIN: 7.3 g/dL (ref 6.0–8.5)

## 2016-01-21 LAB — LIPID PANEL
CHOLESTEROL TOTAL: 143 mg/dL (ref 100–199)
Chol/HDL Ratio: 3.7 ratio units (ref 0.0–5.0)
HDL: 39 mg/dL — ABNORMAL LOW (ref >39)
LDL Calculated: 67 mg/dL (ref 0–99)
Triglycerides: 186 mg/dL — ABNORMAL HIGH (ref 0–149)
VLDL CHOLESTEROL CAL: 37 mg/dL (ref 5–40)

## 2016-01-21 LAB — PSA, TOTAL AND FREE
PROSTATE SPECIFIC AG, SERUM: 0.8 ng/mL (ref 0.0–4.0)
PSA, Free Pct: 35 %
PSA, Free: 0.28 ng/mL

## 2016-01-21 LAB — MICROALBUMIN / CREATININE URINE RATIO
CREATININE, UR: 125.1 mg/dL
Microalb/Creat Ratio: 46 mg/g creat — ABNORMAL HIGH (ref 0.0–30.0)
Microalbumin, Urine: 57.6 ug/mL

## 2016-01-21 NOTE — Therapy (Signed)
[place], [place], [address] Phone: [phone]   Fax:  [phone]  Physical Therapy Treatment  Patient Details  Name: [name] MRN: [id number] Date of Birth: 1940-05-08 Referring Provider: Sydnee Cabal, MD  Encounter Date: 01/21/2016      PT End of Session - 01/21/16 1127    Visit Number 11   Number of Visits 18   Date for PT Re-Evaluation 01/29/16   Authorization Type [employer] Medicare (G-codes updated 7th visit)   Authorization Time Period 12/18/15 to 01/29/16   Authorization - Visit Number 11   Authorization - Number of Visits 17   PT Start Time 0820   PT Stop Time 0905   PT Time Calculation (min) 45 min   Activity Tolerance Patient tolerated treatment well   Behavior During Therapy [facility] for tasks assessed/performed      Past Medical History:  Diagnosis Date  . Acute encephalopathy 08/2010  . Adenomatous polyp   . Anxiety disorder   . Arthritis   . Chronic headaches   . Chronic headaches   . Common migraine 12/23/2014  . Coronary atherosclerosis of native coronary artery    BMS nondominant RCA 12/2004  . Diverticulosis   . Essential hypertension, benign   . Gallbladder cholesterolosis   . GERD (gastroesophageal reflux disease)   . Hyperlipidemia   . Internal hemorrhoids   . Myocardial infarction    2006/1 stent  . Nephrolithiasis   . Shoulder pain, right   . Type 2 diabetes mellitus ([place])     Past Surgical History:  Procedure Laterality Date  . CATARACT EXTRACTION    . CHOLECYSTECTOMY    . COLONOSCOPY  09/10/2008   normal  . CYSTOSCOPY W/ URETEROSCOPY W/ LITHOTRIPSY  08/2010  . JOINT REPLACEMENT     right hip  . LEFT HEART CATHETERIZATION WITH CORONARY ANGIOGRAM N/A 05/14/2011   Procedure: LEFT HEART CATHETERIZATION WITH CORONARY ANGIOGRAM;  Surgeon: Sherren Mocha, MD;  Location: [facility] CATH LAB;  Service: Cardiovascular;  Laterality: N/A;  . ROTATOR CUFF REPAIR     right  . ROTATOR  CUFF REPAIR Left 12/11/2015  . TOTAL KNEE ARTHROPLASTY     Bil  . UPPER GASTROINTESTINAL ENDOSCOPY  01/08/2008   bx, inlet patch, duodenitis  . YAG LASER APPLICATION Right 0/17/5102   Procedure: YAG LASER APPLICATION;  Surgeon: Williams Che, MD;  Location: [facility];  Service: Ophthalmology;  Laterality: Right;    There were no vitals filed for this visit.      Subjective Assessment - 01/21/16 1124    Subjective Pt states his shoulder felt better after last session.  Pt comes without sling.   States he decided to remove his sling himself, encouraged patient to contact MD to make sure this was OK.   Currently in Pain? No/denies                         [facility] Adult PT Treatment/Exercise - 01/21/16 0001      Shoulder Exercises: Supine   Protraction AAROM;Both;15 reps   Protraction Limitations 2# wand   External Rotation AAROM;15 reps;PROM   Flexion 15 reps;AAROM;Left   Shoulder Flexion Weight (lbs) 2# wand   ABduction AAROM;10 reps;Other (comment)   Shoulder ABduction Weight (lbs) 2# wand     Shoulder Exercises: Seated   Retraction 15 reps   Other Seated Exercises bicep curls 3# 15 reps, tricep extension 0# with UE supported by therapist 15  reps     Shoulder Exercises: Prone   Retraction 15 reps   Extension 15 reps     Shoulder Exercises: Pulleys   Flexion 2 minutes   ABduction 2 minutes   ABduction Limitations cueing for form     Shoulder Exercises: ROM/Strengthening   UBE (Upper Arm Bike) 3' forward; 3' backward L1     Manual Therapy   Passive ROM AAROM today: flexion 160 degrees, ABD 105 degrees, ER 50 degrees                PT Education - 01/20/16 1128    Education provided Yes   Education Details instructed to contact MD regarding continuation of wearing sling (per protocol can begin to wean)   Person(s) Educated Patient   Methods Explanation   Comprehension Verbalized understanding          PT Short Term Goals - 01/08/16 0830       PT SHORT TERM GOAL #1   Title Patient to demonstrate L shoulder flexion of 120 degrees and ABD of 110 degrees in order to show improvement of area per protocol and to reduce pain    Time 3   Period Weeks   Status Achieved     PT SHORT TERM GOAL #2   Title Patient to demonstrate L shoulder IR as unlimited and L shoulder ER of 45 degrees in order to show progression per protocol and to reduce pain    Time 3   Period Weeks   Status Achieved     PT SHORT TERM GOAL #3   Title Patient to be able to maintain correct posture at least 75% of the time in order to correct muscle imbalances and reduce pain    Time 3   Period Weeks   Status New     PT SHORT TERM GOAL #4   Title Patient to be independent in correctly and consistently performing appropriate HEP, to be updated PRN    Time 2   Period Weeks   Status New           PT Long Term Goals - 12/18/15 1354      PT LONG TERM GOAL #1   Title Patient to demonstrate L shoulder ROM for flexion, ABD, IR, and ER as being full and unlimited to reduce pain and improve functional task performance    Time 6   Period Weeks   Status New     PT LONG TERM GOAL #2   Title Patient to demonstrate functional strength in all tested groups as being 4/5 in order to improve functional task performance skills    Time 6   Period Weeks   Status New     PT LONG TERM GOAL #3   Title Patient to experience pain no more than 2/10 in order to improve functional and QOL    Time 6   Period Weeks   Status New     PT LONG TERM GOAL #4   Title Patient to be independent in all ADLs and self-care based activities in order to improve independence and functional performance    Time 6   Period Weeks   Status New     PT LONG TERM GOAL #5   Title Patient to demonstrate full cervical ROM in order to improve functional task performance and to assist in maintaining pain reduction    Time 6   Period Weeks   Status New  Plan - 01/21/16  1127    Clinical Impression Statement Continued per protocol.  Added prone scapular strengthening this session and seated bicep/tricep strengthening.  AAROM measured this session in supine as Flex:160, ABD: 105, ER: 50 (goal per protocol is 160, 140, 75).  Pt has met flexion goal but still lacking ABD and ER.  Pt is now week 6 of protocol with 6-8 week phase beginning next week.     Rehab Potential Good   Clinical Impairments Affecting Rehab Potential extensive surgical procedure    PT Frequency 3x / week   PT Duration 6 weeks   PT Treatment/Interventions ADLs/Self Care Home Management;Biofeedback;Cryotherapy;Moist Heat;Functional mobility training;Therapeutic activities;Therapeutic exercise;Balance training;Neuromuscular re-education;Patient/family education;Manual techniques;Passive range of motion;Energy conservation;Dry needling;Taping   PT Next Visit Plan Can progress to weeks 6-8 of shoulder protocol with exception of ER/ABD active strengthening until desired ROM achieved.  continue with PROM for these limits.  Per 6-8 week protocal begin stand/sit strengthening within limits of proper mechanics and scapulohumeral rhythym.  Advance scapular stability.   PT Home Exercise Plan shoulder isometrics all directions 5x10 sec each, flexion AAROM in supine       Patient will benefit from skilled therapeutic intervention in order to improve the following deficits and impairments:  Decreased skin integrity, Improper body mechanics, Pain, Decreased mobility, Postural dysfunction, Decreased range of motion, Decreased strength, Hypomobility, Impaired flexibility  Visit Diagnosis: Acute pain of left shoulder  Stiffness of left shoulder, not elsewhere classified  Abnormal posture  Other symptoms and signs involving the musculoskeletal system     Problem List Patient Active Problem List   Diagnosis Date Noted  . Common migraine 12/23/2014  . Hx of adenomatous colonic polyps 05/09/2014  . Obesity  (BMI 30-39.9) 07/30/2013  . Type 2 diabetes mellitus with circulatory disorder ([place]) 05/27/2011  . Hyperlipidemia associated with type 2 diabetes mellitus ([place]) 10/19/2008  . Essential hypertension, benign 10/19/2008  . CORONARY ATHEROSCLEROSIS NATIVE CORONARY ARTERY 10/17/2008  . GERD (gastroesophageal reflux disease) 12/27/2007    Teena Irani, PTA/CLT [id number]  01/21/2016, 11:35 AM  [employer] [place], [place], [address] Phone: [phone]   Fax:  [phone]  Name: BRANDYN LOWREY MRN: [id number] Date of Birth: [birth year]

## 2016-01-23 ENCOUNTER — Ambulatory Visit (HOSPITAL_COMMUNITY): Payer: Medicare Other | Admitting: Physical Therapy

## 2016-01-23 DIAGNOSIS — M25512 Pain in left shoulder: Secondary | ICD-10-CM | POA: Diagnosis not present

## 2016-01-23 DIAGNOSIS — M25612 Stiffness of left shoulder, not elsewhere classified: Secondary | ICD-10-CM

## 2016-01-23 DIAGNOSIS — R29898 Other symptoms and signs involving the musculoskeletal system: Secondary | ICD-10-CM | POA: Diagnosis not present

## 2016-01-23 DIAGNOSIS — R293 Abnormal posture: Secondary | ICD-10-CM | POA: Diagnosis not present

## 2016-01-23 NOTE — Therapy (Signed)
[place], [place], [address] Phone: [phone]   Fax:  [phone]  Physical Therapy Treatment  Patient Details  Name: [name] MRN: [id number] Date of Birth: 12-17-1940 Referring Provider: Sydnee Cabal, MD  Encounter Date: 01/23/2016      PT End of Session - 01/23/16 1114    Visit Number 12   Number of Visits 18   Date for PT Re-Evaluation 01/29/16   Authorization Type [employer] Medicare (G-codes updated 7th visit)   Authorization Time Period 12/18/15 to 01/29/16   Authorization - Visit Number 12   Authorization - Number of Visits 17   PT Start Time [id number]   PT Stop Time 1114   PT Time Calculation (min) 38 min   Activity Tolerance Patient tolerated treatment well;No increased pain   Behavior During Therapy WFL for tasks assessed/performed      Past Medical History:  Diagnosis Date  . Acute encephalopathy 08/2010  . Adenomatous polyp   . Anxiety disorder   . Arthritis   . Chronic headaches   . Chronic headaches   . Common migraine 12/23/2014  . Coronary atherosclerosis of native coronary artery    BMS nondominant RCA 12/2004  . Diverticulosis   . Essential hypertension, benign   . Gallbladder cholesterolosis   . GERD (gastroesophageal reflux disease)   . Hyperlipidemia   . Internal hemorrhoids   . Myocardial infarction    2006/1 stent  . Nephrolithiasis   . Shoulder pain, right   . Type 2 diabetes mellitus ([place])     Past Surgical History:  Procedure Laterality Date  . CATARACT EXTRACTION    . CHOLECYSTECTOMY    . COLONOSCOPY  09/10/2008   normal  . CYSTOSCOPY W/ URETEROSCOPY W/ LITHOTRIPSY  08/2010  . JOINT REPLACEMENT     right hip  . LEFT HEART CATHETERIZATION WITH CORONARY ANGIOGRAM N/A 05/14/2011   Procedure: LEFT HEART CATHETERIZATION WITH CORONARY ANGIOGRAM;  Surgeon: Sherren Mocha, MD;  Location: [facility] CATH LAB;  Service: Cardiovascular;  Laterality: N/A;  . ROTATOR CUFF REPAIR      right  . ROTATOR CUFF REPAIR Left 12/11/2015  . TOTAL KNEE ARTHROPLASTY     Bil  . UPPER GASTROINTESTINAL ENDOSCOPY  01/08/2008   bx, inlet patch, duodenitis  . YAG LASER APPLICATION Right [id number]   Procedure: YAG LASER APPLICATION;  Surgeon: Williams Che, MD;  Location: [facility];  Service: Ophthalmology;  Laterality: Right;    There were no vitals filed for this visit.      Subjective Assessment - 01/23/16 1040    Subjective Pt states he is doing good. His arm is improving overall, but he has trouble reaching behind his back. He is doing "some" of his exercises but not on days he comes to PT.    Pertinent History HTN, DM, GERD, anxiety, MI, cardiac cath, R hip replacement, R RCR repair    Currently in Pain? No/denies                         [facility] Adult PT Treatment/Exercise - 01/23/16 0001      Shoulder Exercises: Standing   Other Standing Exercises closed chain on mat table, spelling alphabet with LUE on blue weight ball x1 reps;  UE stabilization of green physio ball with perturbations x70min against wall (around elbow region)   Other Standing Exercises bicep curls x20 reps, blue TB; tricep extension x20 reps, blue TB  verbal cues  for technique     Shoulder Exercises: Pulleys   Flexion 2 minutes   ABduction 2 minutes     Shoulder Exercises: Stretch   Other Shoulder Stretches Lt shoulder IR stretch with towel. 10x10 sec hold.      Manual Therapy   Manual Therapy Soft tissue mobilization;Passive ROM   Manual therapy comments separate rest of session   Myofascial Release Anterior deltoid; infraspinatus/teres minor   Passive ROM AAROM flexion with end range stretch x10 reps, Shoulder ER at 70 deg abd x10 reps                 PT Education - 01/23/16 1112    Education provided Yes   Education Details ecnouraged increased HEP adherence; addition to HEP with shoulder IR stretch   Person(s) Educated Patient   Methods  Explanation;Demonstration;Handout   Comprehension Verbalized understanding;Returned demonstration          PT Short Term Goals - 01/08/16 0830      PT SHORT TERM GOAL #1   Title Patient to demonstrate L shoulder flexion of 120 degrees and ABD of 110 degrees in order to show improvement of area per protocol and to reduce pain    Time 3   Period Weeks   Status Achieved     PT SHORT TERM GOAL #2   Title Patient to demonstrate L shoulder IR as unlimited and L shoulder ER of 45 degrees in order to show progression per protocol and to reduce pain    Time 3   Period Weeks   Status Achieved     PT SHORT TERM GOAL #3   Title Patient to be able to maintain correct posture at least 75% of the time in order to correct muscle imbalances and reduce pain    Time 3   Period Weeks   Status New     PT SHORT TERM GOAL #4   Title Patient to be independent in correctly and consistently performing appropriate HEP, to be updated PRN    Time 2   Period Weeks   Status New           PT Long Term Goals - 12/18/15 1354      PT LONG TERM GOAL #1   Title Patient to demonstrate L shoulder ROM for flexion, ABD, IR, and ER as being full and unlimited to reduce pain and improve functional task performance    Time 6   Period Weeks   Status New     PT LONG TERM GOAL #2   Title Patient to demonstrate functional strength in all tested groups as being 4/5 in order to improve functional task performance skills    Time 6   Period Weeks   Status New     PT LONG TERM GOAL #3   Title Patient to experience pain no more than 2/10 in order to improve functional and QOL    Time 6   Period Weeks   Status New     PT LONG TERM GOAL #4   Title Patient to be independent in all ADLs and self-care based activities in order to improve independence and functional performance    Time 6   Period Weeks   Status New     PT LONG TERM GOAL #5   Title Patient to demonstrate full cervical ROM in order to improve  functional task performance and to assist in maintaining pain reduction    Time 6   Period Weeks   Status  New               Plan - 01/23/16 1236    Clinical Impression Statement Continued this visit with AAROM as well as scapular stabilization exercises in closed chain positions to prepare for active ROM progressions under the protocol. Added towel stretch to his HEP to address noted deficits with functional reach behind his back, as this is a large concern of his. STM was also performed to address muscle spasm/scar tissue along the anterior and posterior shoulder. Will continue with POC within MD protocol guidelines.   Rehab Potential Good   Clinical Impairments Affecting Rehab Potential extensive surgical procedure    PT Frequency 3x / week   PT Duration 6 weeks   PT Treatment/Interventions ADLs/Self Care Home Management;Biofeedback;Cryotherapy;Moist Heat;Functional mobility training;Therapeutic activities;Therapeutic exercise;Balance training;Neuromuscular re-education;Patient/family education;Manual techniques;Passive range of motion;Energy conservation;Dry needling;Taping   PT Next Visit Plan week 6-8 of shoulder protocol: Need to focus on ER/ABD ROM, scapular strength/stability in prone or closed chain positions   PT Home Exercise Plan shoulder isometrics all directions 5x10 sec each, flexion AAROM in supine; 01/23/16 Added  Shoulder IR towel stretch    Consulted and Agree with Plan of Care Patient      Patient will benefit from skilled therapeutic intervention in order to improve the following deficits and impairments:  Decreased skin integrity, Improper body mechanics, Pain, Decreased mobility, Postural dysfunction, Decreased range of motion, Decreased strength, Hypomobility, Impaired flexibility  Visit Diagnosis: Acute pain of left shoulder  Stiffness of left shoulder, not elsewhere classified  Abnormal posture  Other symptoms and signs involving the musculoskeletal  system     Problem List Patient Active Problem List   Diagnosis Date Noted  . Common migraine 12/23/2014  . Hx of adenomatous colonic polyps 05/09/2014  . Obesity (BMI 30-39.9) 07/30/2013  . Type 2 diabetes mellitus with circulatory disorder ([place]) 05/27/2011  . Hyperlipidemia associated with type 2 diabetes mellitus ([place]) 10/19/2008  . Essential hypertension, benign 10/19/2008  . CORONARY ATHEROSCLEROSIS NATIVE CORONARY ARTERY 10/17/2008  . GERD (gastroesophageal reflux disease) 12/27/2007    12:43 PM,01/23/16 Elly Modena PT, DPT [facility] [address] [place], [place], [address] Phone: [phone]   Fax:  [phone]  Name: [name] MRN: [id number] Date of Birth: 08-02-1940

## 2016-01-27 ENCOUNTER — Ambulatory Visit (HOSPITAL_COMMUNITY): Payer: Medicare Other | Admitting: Physical Therapy

## 2016-01-27 DIAGNOSIS — M25512 Pain in left shoulder: Secondary | ICD-10-CM

## 2016-01-27 DIAGNOSIS — M25612 Stiffness of left shoulder, not elsewhere classified: Secondary | ICD-10-CM

## 2016-01-27 DIAGNOSIS — R29898 Other symptoms and signs involving the musculoskeletal system: Secondary | ICD-10-CM

## 2016-01-27 DIAGNOSIS — R293 Abnormal posture: Secondary | ICD-10-CM

## 2016-01-27 NOTE — Therapy (Signed)
[place], [place], [address] Phone: [phone]   Fax:  [phone]  Physical Therapy Treatment (Re-Assessment)  Patient Details  Name: [name] MRN: [id number] Date of Birth: 1940-02-03 Referring Provider: Sydnee Cabal   Encounter Date: 01/27/2016      PT End of Session - 01/27/16 1039    Visit Number 13   Number of Visits 22   Date for PT Re-Evaluation 02/17/16   Authorization Type [employer] Medicare (G-codes updated 13th visit)   Authorization Time Period 01/0/93 to 2/35/57; recert done on 3/22   Authorization - Visit Number 13   Authorization - Number of Visits 23   PT Start Time 0944   PT Stop Time 1026   PT Time Calculation (min) 42 min   Activity Tolerance Patient tolerated treatment well   Behavior During Therapy [facility] for tasks assessed/performed      Past Medical History:  Diagnosis Date  . Acute encephalopathy 08/2010  . Adenomatous polyp   . Anxiety disorder   . Arthritis   . Chronic headaches   . Chronic headaches   . Common migraine 12/23/2014  . Coronary atherosclerosis of native coronary artery    BMS nondominant RCA 12/2004  . Diverticulosis   . Essential hypertension, benign   . Gallbladder cholesterolosis   . GERD (gastroesophageal reflux disease)   . Hyperlipidemia   . Internal hemorrhoids   . Myocardial infarction    2006/1 stent  . Nephrolithiasis   . Shoulder pain, right   . Type 2 diabetes mellitus ([place])     Past Surgical History:  Procedure Laterality Date  . CATARACT EXTRACTION    . CHOLECYSTECTOMY    . COLONOSCOPY  09/10/2008   normal  . CYSTOSCOPY W/ URETEROSCOPY W/ LITHOTRIPSY  08/2010  . JOINT REPLACEMENT     right hip  . LEFT HEART CATHETERIZATION WITH CORONARY ANGIOGRAM N/A 05/14/2011   Procedure: LEFT HEART CATHETERIZATION WITH CORONARY ANGIOGRAM;  Surgeon: Sherren Mocha, MD;  Location: [facility] CATH LAB;  Service: Cardiovascular;  Laterality: N/A;  . ROTATOR  CUFF REPAIR     right  . ROTATOR CUFF REPAIR Left 12/11/2015  . TOTAL KNEE ARTHROPLASTY     Bil  . UPPER GASTROINTESTINAL ENDOSCOPY  01/08/2008   bx, inlet patch, duodenitis  . YAG LASER APPLICATION Right 0/25/4270   Procedure: YAG LASER APPLICATION;  Surgeon: Williams Che, MD;  Location: [facility];  Service: Ophthalmology;  Laterality: Right;    There were no vitals filed for this visit.      Subjective Assessment - 01/27/16 0947    Subjective Patient arrives stating he is doing well, it is the hardest for him to get his UE up behind his back. He has been working on his HEP for this. Nothing else major going on, he is only "doing a little" every day, not all of them.    Pertinent History HTN, DM, GERD, anxiety, MI, cardiac cath, R hip replacement, R RCR repair    Patient Stated Goals reduce pain, get L shoulder working well again    Currently in Pain? Yes   Pain Score 2    Pain Location Shoulder   Pain Orientation Left   Pain Descriptors / Indicators Burning;Sore   Pain Type Surgical pain   Pain Radiating Towards no radicular symptoms    Pain Onset More than a month ago   Pain Frequency Intermittent   Aggravating Factors  movement/exercises with UE    Pain  Relieving Factors sitting still   Effect of Pain on Daily Activities significant limitation             [facility] PT Assessment - 01/27/16 0001      Assessment   Medical Diagnosis Lt shoulder repair   Referring Provider Sydnee Cabal    Onset Date/Surgical Date 12/11/15   Next MD Visit Dr. Theda Sers on the 29th    Prior Therapy none      Balance Screen   Has the patient fallen in the past 6 months No   Has the patient had a decrease in activity level because of a fear of falling?  No   Is the patient reluctant to leave their home because of a fear of falling?  No     Prior Function   Level of Independence Independent;Independent with basic ADLs;Independent with gait;Independent with transfers   Vocation Retired      Observation/Other Assessments   Focus on Therapeutic Outcomes (FOTO)  45% (was 4%)      AROM   Left Shoulder Flexion 160 Degrees  active with overpressure from PT    Left Shoulder ABduction 150 Degrees  PROM    Left Shoulder External Rotation 60 Degrees  PROM at 60 degrees of ABD      Strength   Overall Strength Comments L grip strength only mildly weaker per manual testing    Right Shoulder Flexion 5/5   Right Shoulder ABduction 5/5   Right Shoulder Internal Rotation 4+/5   Right Shoulder External Rotation 4+/5   Left Shoulder Flexion 4/5   Left Shoulder Extension 4-/5   Left Shoulder Internal Rotation 5/5   Left Shoulder External Rotation 4-/5   Right Elbow Flexion 5/5   Right Elbow Extension 5/5   Left Elbow Flexion 4+/5   Left Elbow Extension 4+/5   Right Wrist Flexion 5/5   Right Wrist Extension 5/5   Left Wrist Flexion 5/5   Left Wrist Extension 5/5                     [facility] Adult PT Treatment/Exercise - 01/27/16 0001      Shoulder Exercises: Seated   Other Seated Exercises AAROM on pulleys with 5 second holds x20     Shoulder Exercises: Standing   Other Standing Exercises wall ABCs x3      Shoulder Exercises: Body Blade   Other Body Blade Exercises standing verticals and horizontals 1 minute each B UEs                 PT Education - 01/27/16 1038    Education provided Yes   Education Details progress, POC moving forward    Person(s) Educated Patient   Methods Explanation   Comprehension Verbalized understanding          PT Short Term Goals - 01/27/16 1009      PT SHORT TERM GOAL #1   Title Patient to demonstrate L shoulder flexion of 120 degrees and ABD of 110 degrees in order to show improvement of area per protocol and to reduce pain    Time 3   Period Weeks   Status Achieved     PT SHORT TERM GOAL #2   Title Patient to demonstrate L shoulder IR as unlimited and L shoulder ER of 45 degrees in order to show progression  per protocol and to reduce pain    Baseline 1/16- 60 at 60 degrees ABD    Time 3   Period  Weeks   Status Achieved     PT SHORT TERM GOAL #3   Title Patient to be able to maintain correct posture at least 75% of the time in order to correct muscle imbalances and reduce pain    Time 3   Period Weeks   Status Partially Met     PT SHORT TERM GOAL #4   Title Patient to be independent in correctly and consistently performing appropriate HEP, to be updated PRN    Baseline 1/16- only doing "some" of his HEP    Time 2   Period Weeks   Status On-going           PT Long Term Goals - 01/27/16 1010      PT LONG TERM GOAL #1   Title Patient to demonstrate L shoulder ROM for flexion, ABD, IR, and ER as being full and unlimited to reduce pain and improve functional task performance    Time 6   Period Weeks   Status On-going     PT LONG TERM GOAL #2   Title Patient to demonstrate functional strength in all tested groups as being 4/5 in order to improve functional task performance skills    Time 6   Period Weeks   Status On-going     PT LONG TERM GOAL #3   Title Patient to experience pain no more than 2/10 in order to improve functional and QOL    Baseline 1/16- "its close except for when I'm trying to get my arm behind my back"    Time 6   Period Weeks   Status On-going     PT LONG TERM GOAL #4   Title Patient to be independent in all ADLs and self-care based activities in order to improve independence and functional performance    Time 6   Period Weeks   Status Partially Met     PT LONG TERM GOAL #5   Title Patient to demonstrate full cervical ROM in order to improve functional task performance and to assist in maintaining pain reduction    Baseline 1/16- appears stiff with rotation and lateral flexion but patient reports being a little stiff is his baseline               Plan - 01/27/16 1040    Clinical Impression Statement Re-assessment performed today. Patient  appears to continue to make progress generally along lines of MD protocol, however he does continue to demonstrate ongoing mild stiffness in flexion, ABD, and ER planes of motion today but is getting close to functional ROM. Able to test strength today with elbow/wrist/grip strength being generally [facility] however L shoulder strength does remain weak. Posture is improving in general. Patient seems to be progressing generally towards goals as indicated by MD protocol and will benefit from ongoing skilled PT care to continue appropriately progressing and stabilizing his surgical UE.    Rehab Potential Good   Clinical Impairments Affecting Rehab Potential extensive surgical procedure    PT Frequency Other (comment)  3x/week for 2 more weeks, then 2x/week for 2 more weeks    PT Duration 4 weeks   PT Treatment/Interventions ADLs/Self Care Home Management;Biofeedback;Cryotherapy;Moist Heat;Functional mobility training;Therapeutic activities;Therapeutic exercise;Balance training;Neuromuscular re-education;Patient/family education;Manual techniques;Passive range of motion;Energy conservation;Dry needling;Taping   PT Next Visit Plan week 6-8 of shoulder protocol: Need to focus on ER/ABD ROM, scapular strength/stability in prone or closed chain positions   PT Home Exercise Plan shoulder isometrics all directions 5x10 sec each, flexion AAROM in  supine; 01/23/16 Added  Shoulder IR towel stretch    Consulted and Agree with Plan of Care Patient      Patient will benefit from skilled therapeutic intervention in order to improve the following deficits and impairments:  Decreased skin integrity, Improper body mechanics, Pain, Decreased mobility, Postural dysfunction, Decreased range of motion, Decreased strength, Hypomobility, Impaired flexibility  Visit Diagnosis: Acute pain of left shoulder - Plan: PT plan of care cert/re-cert  Stiffness of left shoulder, not elsewhere classified - Plan: PT plan of care  cert/re-cert  Abnormal posture - Plan: PT plan of care cert/re-cert  Other symptoms and signs involving the musculoskeletal system - Plan: PT plan of care cert/re-cert       G-Codes - Feb 19, 2016 1040    Functional Assessment Tool Used Clinical judgement based on assessment of ROM, strength and activity tolerance.    Functional Limitation Mobility: Walking and moving around   Mobility: Walking and Moving Around Current Status [phone]) At least 20 percent but less than 40 percent impaired, limited or restricted   Mobility: Walking and Moving Around Goal Status [phone]) At least 1 percent but less than 20 percent impaired, limited or restricted      Problem List Patient Active Problem List   Diagnosis Date Noted  . Common migraine 12/23/2014  . Hx of adenomatous colonic polyps 05/09/2014  . Obesity (BMI 30-39.9) 07/30/2013  . Type 2 diabetes mellitus with circulatory disorder ([place]) 05/27/2011  . Hyperlipidemia associated with type 2 diabetes mellitus ([place]) 10/19/2008  . Essential hypertension, benign 10/19/2008  . CORONARY ATHEROSCLEROSIS NATIVE CORONARY ARTERY 10/17/2008  . GERD (gastroesophageal reflux disease) 12/27/2007    Deniece Ree PT, DPT [place] [address] [place], [place], [address] Phone: [phone]   Fax:  [phone]  Name: ROCKLAND KOTARSKI MRN: [id number] Date of Birth: 1940-12-12

## 2016-01-29 ENCOUNTER — Encounter (HOSPITAL_COMMUNITY): Payer: Self-pay | Admitting: Physical Therapy

## 2016-01-30 ENCOUNTER — Ambulatory Visit (HOSPITAL_COMMUNITY): Payer: Medicare Other | Admitting: Physical Therapy

## 2016-01-30 DIAGNOSIS — M25512 Pain in left shoulder: Secondary | ICD-10-CM | POA: Diagnosis not present

## 2016-01-30 DIAGNOSIS — M25612 Stiffness of left shoulder, not elsewhere classified: Secondary | ICD-10-CM

## 2016-01-30 DIAGNOSIS — R293 Abnormal posture: Secondary | ICD-10-CM | POA: Diagnosis not present

## 2016-01-30 DIAGNOSIS — R29898 Other symptoms and signs involving the musculoskeletal system: Secondary | ICD-10-CM | POA: Diagnosis not present

## 2016-01-30 NOTE — Therapy (Signed)
[place], [place], [address] Phone: [phone]   Fax:  [phone]  Physical Therapy Treatment  Patient Details  Name: [name] MRN: [id number] Date of Birth: 1940-02-28 Referring Provider: Sydnee Cabal   Encounter Date: 01/30/2016      PT End of Session - 01/30/16 0923    Visit Number 14   Number of Visits 22   Date for PT Re-Evaluation 02/17/16   Authorization Type UHC Medicare (G-codes updated 13th visit)   Authorization Time Period 14/7/09 to 2/95/74; recert done on 7/34   Authorization - Visit Number 14   Authorization - Number of Visits 23   PT Start Time 0815   PT Stop Time 0900   PT Time Calculation (min) 45 min   Activity Tolerance Patient tolerated treatment well   Behavior During Therapy [facility] for tasks assessed/performed      Past Medical History:  Diagnosis Date  . Acute encephalopathy 08/2010  . Adenomatous polyp   . Anxiety disorder   . Arthritis   . Chronic headaches   . Chronic headaches   . Common migraine 12/23/2014  . Coronary atherosclerosis of native coronary artery    BMS nondominant RCA 12/2004  . Diverticulosis   . Essential hypertension, benign   . Gallbladder cholesterolosis   . GERD (gastroesophageal reflux disease)   . Hyperlipidemia   . Internal hemorrhoids   . Myocardial infarction    2006/1 stent  . Nephrolithiasis   . Shoulder pain, right   . Type 2 diabetes mellitus ([place])     Past Surgical History:  Procedure Laterality Date  . CATARACT EXTRACTION    . CHOLECYSTECTOMY    . COLONOSCOPY  09/10/2008   normal  . CYSTOSCOPY W/ URETEROSCOPY W/ LITHOTRIPSY  08/2010  . JOINT REPLACEMENT     right hip  . LEFT HEART CATHETERIZATION WITH CORONARY ANGIOGRAM N/A 05/14/2011   Procedure: LEFT HEART CATHETERIZATION WITH CORONARY ANGIOGRAM;  Surgeon: Sherren Mocha, MD;  Location: [facility] CATH LAB;  Service: Cardiovascular;  Laterality: N/A;  . ROTATOR CUFF REPAIR      right  . ROTATOR CUFF REPAIR Left 12/11/2015  . TOTAL KNEE ARTHROPLASTY     Bil  . UPPER GASTROINTESTINAL ENDOSCOPY  01/08/2008   bx, inlet patch, duodenitis  . YAG LASER APPLICATION Right 0/37/0964   Procedure: YAG LASER APPLICATION;  Surgeon: Williams Che, MD;  Location: [facility];  Service: Ophthalmology;  Laterality: Right;    There were no vitals filed for this visit.      Subjective Assessment - 01/30/16 0929    Subjective Pt states he is doing.  States he still has soreness at night but overall improving.  No pain.    Currently in Pain? No/denies                         [facility] Adult PT Treatment/Exercise - 01/30/16 0001      Shoulder Exercises: Supine   External Rotation AAROM;15 reps;PROM   Flexion 15 reps;AAROM;Left   Shoulder Flexion Weight (lbs) 3# wand   ABduction AAROM;10 reps;Other (comment)   Shoulder ABduction Weight (lbs) 3# wand     Shoulder Exercises: Seated   Retraction 15 reps   Other Seated Exercises bicep curls 3# 15 reps     Shoulder Exercises: Prone   Retraction 15 reps   Extension 15 reps     Shoulder Exercises: Sidelying   External Rotation Left;15 reps  ABduction Left;15 reps     Shoulder Exercises: Pulleys   Flexion 2 minutes   ABduction 2 minutes     Shoulder Exercises: ROM/Strengthening   UBE (Upper Arm Bike) 3' forward; 3' backward L1     Shoulder Exercises: Stretch   Other Shoulder Stretches supine:  flexion 160, ABD 120,  ER 70 degrees     Shoulder Exercises: Body Blade   Other Body Blade Exercises standing verticals and horizontals 1 minute each B UEs      Manual Therapy   Manual Therapy Soft tissue mobilization;Passive ROM   Manual therapy comments separate rest of session   Myofascial Release Anterior deltoid; infraspinatus/teres minor   Passive ROM AAROM flexion with end range stretch x10 reps                  PT Short Term Goals - 01/27/16 1009      PT SHORT TERM GOAL #1   Title Patient  to demonstrate L shoulder flexion of 120 degrees and ABD of 110 degrees in order to show improvement of area per protocol and to reduce pain    Time 3   Period Weeks   Status Achieved     PT SHORT TERM GOAL #2   Title Patient to demonstrate L shoulder IR as unlimited and L shoulder ER of 45 degrees in order to show progression per protocol and to reduce pain    Baseline 1/16- 60 at 60 degrees ABD    Time 3   Period Weeks   Status Achieved     PT SHORT TERM GOAL #3   Title Patient to be able to maintain correct posture at least 75% of the time in order to correct muscle imbalances and reduce pain    Time 3   Period Weeks   Status Partially Met     PT SHORT TERM GOAL #4   Title Patient to be independent in correctly and consistently performing appropriate HEP, to be updated PRN    Baseline 1/16- only doing "some" of his HEP    Time 2   Period Weeks   Status On-going           PT Long Term Goals - 01/27/16 1010      PT LONG TERM GOAL #1   Title Patient to demonstrate L shoulder ROM for flexion, ABD, IR, and ER as being full and unlimited to reduce pain and improve functional task performance    Time 6   Period Weeks   Status On-going     PT LONG TERM GOAL #2   Title Patient to demonstrate functional strength in all tested groups as being 4/5 in order to improve functional task performance skills    Time 6   Period Weeks   Status On-going     PT LONG TERM GOAL #3   Title Patient to experience pain no more than 2/10 in order to improve functional and QOL    Baseline 1/16- "its close except for when I'm trying to get my arm behind my back"    Time 6   Period Weeks   Status On-going     PT LONG TERM GOAL #4   Title Patient to be independent in all ADLs and self-care based activities in order to improve independence and functional performance    Time 6   Period Weeks   Status Partially Met     PT LONG TERM GOAL #5   Title Patient to demonstrate full cervical  ROM in  order to improve functional task performance and to assist in maintaining pain reduction    Baseline 1/16- appears stiff with rotation and lateral flexion but patient reports being a little stiff is his baseline               Plan - 01/30/16 6761    Clinical Impression Statement continued with MD protocol.   Mostly limited with ER and abduction at this point.  Myofascial techniques completed to anterior deltoid with improvment in ROM following and less discomfort in end ROM.  Flex/abd/ER tested in supine at 160,120, 70 degrees respectively.    Rehab Potential Good   Clinical Impairments Affecting Rehab Potential extensive surgical procedure    PT Frequency Other (comment)  3x/week for 2 more weeks, then 2x/week for 2 more weeks    PT Duration 4 weeks   PT Treatment/Interventions ADLs/Self Care Home Management;Biofeedback;Cryotherapy;Moist Heat;Functional mobility training;Therapeutic activities;Therapeutic exercise;Balance training;Neuromuscular re-education;Patient/family education;Manual techniques;Passive range of motion;Energy conservation;Dry needling;Taping   PT Next Visit Plan week 6-8 of shoulder protocol: Need to focus on ER/ABD ROM, scapular strength/stability in prone or closed chain positions   PT Home Exercise Plan shoulder isometrics all directions 5x10 sec each, flexion AAROM in supine; 01/23/16 Added  Shoulder IR towel stretch    Consulted and Agree with Plan of Care Patient      Patient will benefit from skilled therapeutic intervention in order to improve the following deficits and impairments:  Decreased skin integrity, Improper body mechanics, Pain, Decreased mobility, Postural dysfunction, Decreased range of motion, Decreased strength, Hypomobility, Impaired flexibility  Visit Diagnosis: Acute pain of left shoulder  Stiffness of left shoulder, not elsewhere classified  Abnormal posture  Other symptoms and signs involving the musculoskeletal  system     Problem List Patient Active Problem List   Diagnosis Date Noted  . Common migraine 12/23/2014  . Hx of adenomatous colonic polyps 05/09/2014  . Obesity (BMI 30-39.9) 07/30/2013  . Type 2 diabetes mellitus with circulatory disorder ([place]) 05/27/2011  . Hyperlipidemia associated with type 2 diabetes mellitus ([place]) 10/19/2008  . Essential hypertension, benign 10/19/2008  . CORONARY ATHEROSCLEROSIS NATIVE CORONARY ARTERY 10/17/2008  . GERD (gastroesophageal reflux disease) 12/27/2007    Teena Irani, PTA/CLT [phone]  01/30/2016, 9:30 AM  [place], [place], [address] Phone: [phone]   Fax:  [phone]  Name: [name] MRN: [id number] Date of Birth: 06/16/1940

## 2016-02-03 ENCOUNTER — Ambulatory Visit (HOSPITAL_COMMUNITY): Payer: Medicare Other | Admitting: Physical Therapy

## 2016-02-03 DIAGNOSIS — R293 Abnormal posture: Secondary | ICD-10-CM | POA: Diagnosis not present

## 2016-02-03 DIAGNOSIS — M25612 Stiffness of left shoulder, not elsewhere classified: Secondary | ICD-10-CM | POA: Diagnosis not present

## 2016-02-03 DIAGNOSIS — M25512 Pain in left shoulder: Secondary | ICD-10-CM | POA: Diagnosis not present

## 2016-02-03 DIAGNOSIS — R29898 Other symptoms and signs involving the musculoskeletal system: Secondary | ICD-10-CM | POA: Diagnosis not present

## 2016-02-03 NOTE — Therapy (Signed)
[place], [place], [address] Phone: [phone]   Fax:  [phone]  Physical Therapy Treatment  Patient Details  Name: [name] MRN: [id number] Date of Birth: [birth year] Referring Provider: Sydnee Cabal   Encounter Date: 02/03/2016      PT End of Session - 02/03/16 1027    Visit Number 15   Number of Visits 22   Date for PT Re-Evaluation 02/17/16   Authorization Type U[employer] Medicare (G-codes updated 13th visit)   Authorization Time Period 16/9/67 to 8/93/81; recert done on 0/17   Authorization - Visit Number 15   Authorization - Number of Visits 23   PT Start Time 0948   PT Stop Time 1027   PT Time Calculation (min) 39 min   Activity Tolerance Patient tolerated treatment well   Behavior During Therapy [facility] for tasks assessed/performed      Past Medical History:  Diagnosis Date  . Acute encephalopathy 08/2010  . Adenomatous polyp   . Anxiety disorder   . Arthritis   . Chronic headaches   . Chronic headaches   . Common migraine 12/23/2014  . Coronary atherosclerosis of native coronary artery    BMS nondominant RCA 12/2004  . Diverticulosis   . Essential hypertension, benign   . Gallbladder cholesterolosis   . GERD (gastroesophageal reflux disease)   . Hyperlipidemia   . Internal hemorrhoids   . Myocardial infarction    2006/1 stent  . Nephrolithiasis   . Shoulder pain, right   . Type 2 diabetes mellitus ([place])     Past Surgical History:  Procedure Laterality Date  . CATARACT EXTRACTION    . CHOLECYSTECTOMY    . COLONOSCOPY  09/10/2008   normal  . CYSTOSCOPY W/ URETEROSCOPY W/ LITHOTRIPSY  08/2010  . JOINT REPLACEMENT     right hip  . LEFT HEART CATHETERIZATION WITH CORONARY ANGIOGRAM N/A 05/14/2011   Procedure: LEFT HEART CATHETERIZATION WITH CORONARY ANGIOGRAM;  Surgeon: Sherren Mocha, MD;  Location: [facility] CATH LAB;  Service: Cardiovascular;  Laterality: N/A;  . ROTATOR CUFF REPAIR     right  . ROTATOR CUFF REPAIR Left 12/11/2015  . TOTAL KNEE ARTHROPLASTY     Bil  . UPPER GASTROINTESTINAL ENDOSCOPY  01/08/2008   bx, inlet patch, duodenitis  . YAG LASER APPLICATION Right 05/21/2583   Procedure: YAG LASER APPLICATION;  Surgeon: Williams Che, MD;  Location: [facility];  Service: Ophthalmology;  Laterality: Right;    There were no vitals filed for this visit.      Subjective Assessment - 02/03/16 0949    Subjective Patient arrives today stating he is doing OK, his shoulder continues to bother him at night still. He sees his MD next monday.    Pertinent History HTN, DM, GERD, anxiety, MI, cardiac cath, R hip replacement, R RCR repair    Currently in Pain? No/denies                         [facility] Adult PT Treatment/Exercise - 02/03/16 0001      Shoulder Exercises: Supine   External Rotation AAROM;5 reps   Theraband Level (Shoulder External Rotation) --  full ROM    Internal Rotation AAROM;5 reps   Internal Rotation Limitations full ROM    Flexion AAROM;5 reps   Flexion Limitations full ROM    ABduction AAROM;5 reps   ABduction Limitations full ROM      Shoulder Exercises: Seated  Retraction 20 reps   Theraband Level (Shoulder Retraction) --  3 second holds    External Rotation 10 reps   External Rotation Limitations towel under elbow    Flexion 15 reps   Flexion Limitations cues to reduce compensation    Abduction Both;10 reps   ABduction Limitations cue to reduce compensations    Other Seated Exercises scapular depression x20 with 3 second holds    Other Seated Exercises backwards shoulder rolls (up, back, down) 1x15      Shoulder Exercises: Standing   Other Standing Exercises wall ABCs x4; body blade x2 minutes vertical and horizontal    Other Standing Exercises weight shifts onto air pad 1x20; standing pendulums 90 seconds each way                 PT Education - 02/03/16 1027    Education provided Yes   Education Details  possible DOMS    Person(s) Educated Patient   Methods Explanation   Comprehension Verbalized understanding          PT Short Term Goals - 01/27/16 1009      PT SHORT TERM GOAL #1   Title Patient to demonstrate L shoulder flexion of 120 degrees and ABD of 110 degrees in order to show improvement of area per protocol and to reduce pain    Time 3   Period Weeks   Status Achieved     PT SHORT TERM GOAL #2   Title Patient to demonstrate L shoulder IR as unlimited and L shoulder ER of 45 degrees in order to show progression per protocol and to reduce pain    Baseline 1/16- 60 at 60 degrees ABD    Time 3   Period Weeks   Status Achieved     PT SHORT TERM GOAL #3   Title Patient to be able to maintain correct posture at least 75% of the time in order to correct muscle imbalances and reduce pain    Time 3   Period Weeks   Status Partially Met     PT SHORT TERM GOAL #4   Title Patient to be independent in correctly and consistently performing appropriate HEP, to be updated PRN    Baseline 1/16- only doing "some" of his HEP    Time 2   Period Weeks   Status On-going           PT Long Term Goals - 01/27/16 1010      PT LONG TERM GOAL #1   Title Patient to demonstrate L shoulder ROM for flexion, ABD, IR, and ER as being full and unlimited to reduce pain and improve functional task performance    Time 6   Period Weeks   Status On-going     PT LONG TERM GOAL #2   Title Patient to demonstrate functional strength in all tested groups as being 4/5 in order to improve functional task performance skills    Time 6   Period Weeks   Status On-going     PT LONG TERM GOAL #3   Title Patient to experience pain no more than 2/10 in order to improve functional and QOL    Baseline 1/16- "its close except for when I'm trying to get my arm behind my back"    Time 6   Period Weeks   Status On-going     PT LONG TERM GOAL #4   Title Patient to be independent in all ADLs and self-care  based activities in  order to improve independence and functional performance    Time 6   Period Weeks   Status Partially Met     PT LONG TERM GOAL #5   Title Patient to demonstrate full cervical ROM in order to improve functional task performance and to assist in maintaining pain reduction    Baseline 1/16- appears stiff with rotation and lateral flexion but patient reports being a little stiff is his baseline               Plan - 02/03/16 1027    Clinical Impression Statement Continued work per MD protocol, starting with AAROM through flexion/ABD/ER/IR and then continuing work on scapular stabilizing muscle groups and rotator cuff muscles this session as well as AROM through functional planes this session as well. Noted difficulty with compensations with AROM exercises in sitting today despite DPT cues, recommend continuing to work on these in gravity eliminated positions to help with form correction moving forward.     Rehab Potential Good   Clinical Impairments Affecting Rehab Potential extensive surgical procedure    PT Frequency Other (comment)  3x/week for 2 weeks, 2x/week for 2 weeks    PT Duration 4 weeks   PT Treatment/Interventions ADLs/Self Care Home Management;Biofeedback;Cryotherapy;Moist Heat;Functional mobility training;Therapeutic activities;Therapeutic exercise;Balance training;Neuromuscular re-education;Patient/family education;Manual techniques;Passive range of motion;Energy conservation;Dry needling;Taping   PT Next Visit Plan week 6-8 of shoulder protocol: Need to focus on ER/ABD ROM, scapular strength/stability in prone or closed chain positions   PT Home Exercise Plan shoulder isometrics all directions 5x10 sec each, flexion AAROM in supine; 01/23/16 Added  Shoulder IR towel stretch    Consulted and Agree with Plan of Care Patient      Patient will benefit from skilled therapeutic intervention in order to improve the following deficits and impairments:  Decreased  skin integrity, Improper body mechanics, Pain, Decreased mobility, Postural dysfunction, Decreased range of motion, Decreased strength, Hypomobility, Impaired flexibility  Visit Diagnosis: Acute pain of left shoulder  Stiffness of left shoulder, not elsewhere classified  Abnormal posture  Other symptoms and signs involving the musculoskeletal system     Problem List Patient Active Problem List   Diagnosis Date Noted  . Common migraine 12/23/2014  . Hx of adenomatous colonic polyps 05/09/2014  . Obesity (BMI 30-39.9) 07/30/2013  . Type 2 diabetes mellitus with circulatory disorder ([place]) 05/27/2011  . Hyperlipidemia associated with type 2 diabetes mellitus ([place]) 10/19/2008  . Essential hypertension, benign 10/19/2008  . CORONARY ATHEROSCLEROSIS NATIVE CORONARY ARTERY 10/17/2008  . GERD (gastroesophageal reflux disease) 12/27/2007    Deniece Ree PT, DPT [place] [address] [place], [place], [address] Phone: [phone]   Fax:  [phone]  Name: KUBA SHEPHERD MRN: [id number] Date of Birth: [birth year]

## 2016-02-05 ENCOUNTER — Ambulatory Visit (HOSPITAL_COMMUNITY): Payer: Medicare Other

## 2016-02-05 DIAGNOSIS — R293 Abnormal posture: Secondary | ICD-10-CM

## 2016-02-05 DIAGNOSIS — R29898 Other symptoms and signs involving the musculoskeletal system: Secondary | ICD-10-CM

## 2016-02-05 DIAGNOSIS — M25612 Stiffness of left shoulder, not elsewhere classified: Secondary | ICD-10-CM

## 2016-02-05 DIAGNOSIS — M25512 Pain in left shoulder: Secondary | ICD-10-CM | POA: Diagnosis not present

## 2016-02-05 NOTE — Therapy (Signed)
[place], [place], [address] Phone: [phone]   Fax:  [phone]  Physical Therapy Treatment  Patient Details  Name: [name] MRN: [id number] Date of Birth: 1940/08/08 Referring Provider: Sydnee Cabal   Encounter Date: 02/05/2016      PT End of Session - 02/05/16 1043    Visit Number 16   Number of Visits 22   Date for PT Re-Evaluation 02/17/16   Authorization Type U[employer] Medicare (G-codes updated 13th visit)   Authorization Time Period 16/6/06 to 03/12/58; recert done on 1/09   Authorization - Visit Number 16   Authorization - Number of Visits 23   PT Start Time 1036   PT Stop Time 1121   PT Time Calculation (min) 45 min   Activity Tolerance Patient tolerated treatment well   Behavior During Therapy [facility] for tasks assessed/performed      Past Medical History:  Diagnosis Date  . Acute encephalopathy 08/2010  . Adenomatous polyp   . Anxiety disorder   . Arthritis   . Chronic headaches   . Chronic headaches   . Common migraine 12/23/2014  . Coronary atherosclerosis of native coronary artery    BMS nondominant RCA 12/2004  . Diverticulosis   . Essential hypertension, benign   . Gallbladder cholesterolosis   . GERD (gastroesophageal reflux disease)   . Hyperlipidemia   . Internal hemorrhoids   . Myocardial infarction    2006/1 stent  . Nephrolithiasis   . Shoulder pain, right   . Type 2 diabetes mellitus ([place])     Past Surgical History:  Procedure Laterality Date  . CATARACT EXTRACTION    . CHOLECYSTECTOMY    . COLONOSCOPY  09/10/2008   normal  . CYSTOSCOPY W/ URETEROSCOPY W/ LITHOTRIPSY  08/2010  . JOINT REPLACEMENT     right hip  . LEFT HEART CATHETERIZATION WITH CORONARY ANGIOGRAM N/A 05/14/2011   Procedure: LEFT HEART CATHETERIZATION WITH CORONARY ANGIOGRAM;  Surgeon: Sherren Mocha, MD;  Location: [facility] CATH LAB;  Service: Cardiovascular;  Laterality: N/A;  . ROTATOR CUFF REPAIR     right  . ROTATOR CUFF REPAIR Left 12/11/2015  . TOTAL KNEE ARTHROPLASTY     Bil  . UPPER GASTROINTESTINAL ENDOSCOPY  01/08/2008   bx, inlet patch, duodenitis  . YAG LASER APPLICATION Right 04/03/5571   Procedure: YAG LASER APPLICATION;  Surgeon: Williams Che, MD;  Location: [facility];  Service: Ophthalmology;  Laterality: Right;    There were no vitals filed for this visit.      Subjective Assessment - 02/05/16 1041    Subjective Pt stated he has some soreness Lt shoulder, minimal pain.  Returns to Md next Monday   Pertinent History HTN, DM, GERD, anxiety, MI, cardiac cath, R hip replacement, R RCR repair    Patient Stated Goals reduce pain, get L shoulder working well again    Currently in Pain? Yes   Pain Score 1    Pain Location Shoulder   Pain Orientation Left   Pain Descriptors / Indicators Sore   Pain Type Surgical pain   Pain Onset More than a month ago   Pain Frequency Intermittent                         [facility] Adult PT Treatment/Exercise - 02/05/16 0001      Shoulder Exercises: Supine   Flexion AAROM;10 reps   Flexion Limitations full ROM    ABduction AAROM;10  reps     Shoulder Exercises: Seated   Retraction 20 reps  5" holds   Other Seated Exercises backwards shoulder rolls (up, back, down) 1x15 therapist facilitation to reduce compensation     Shoulder Exercises: Sidelying   External Rotation Left;15 reps   ABduction Left;15 reps;AAROM     Shoulder Exercises: Standing   Flexion 5 reps   Flexion Limitations wall walking 5reps     Shoulder Exercises: Pulleys   Flexion 2 minutes   ABduction 2 minutes   ABduction Limitations cueing for form     Shoulder Exercises: ROM/Strengthening   UBE (Upper Arm Bike) 3' forward; 3' backward L1     Manual Therapy   Manual Therapy Soft tissue mobilization;Passive ROM   Manual therapy comments separate rest of session   Myofascial Release Anterior deltoid; infraspinatus/teres minor   Passive ROM  AAROM flexion with end range stretch x10 reps                  PT Short Term Goals - 01/27/16 1009      PT SHORT TERM GOAL #1   Title Patient to demonstrate L shoulder flexion of 120 degrees and ABD of 110 degrees in order to show improvement of area per protocol and to reduce pain    Time 3   Period Weeks   Status Achieved     PT SHORT TERM GOAL #2   Title Patient to demonstrate L shoulder IR as unlimited and L shoulder ER of 45 degrees in order to show progression per protocol and to reduce pain    Baseline 1/16- 60 at 60 degrees ABD    Time 3   Period Weeks   Status Achieved     PT SHORT TERM GOAL #3   Title Patient to be able to maintain correct posture at least 75% of the time in order to correct muscle imbalances and reduce pain    Time 3   Period Weeks   Status Partially Met     PT SHORT TERM GOAL #4   Title Patient to be independent in correctly and consistently performing appropriate HEP, to be updated PRN    Baseline 1/16- only doing "some" of his HEP    Time 2   Period Weeks   Status On-going           PT Long Term Goals - 01/27/16 1010      PT LONG TERM GOAL #1   Title Patient to demonstrate L shoulder ROM for flexion, ABD, IR, and ER as being full and unlimited to reduce pain and improve functional task performance    Time 6   Period Weeks   Status On-going     PT LONG TERM GOAL #2   Title Patient to demonstrate functional strength in all tested groups as being 4/5 in order to improve functional task performance skills    Time 6   Period Weeks   Status On-going     PT LONG TERM GOAL #3   Title Patient to experience pain no more than 2/10 in order to improve functional and QOL    Baseline 1/16- "its close except for when I'm trying to get my arm behind my back"    Time 6   Period Weeks   Status On-going     PT LONG TERM GOAL #4   Title Patient to be independent in all ADLs and self-care based activities in order to improve independence  and functional performance  Time 6   Period Weeks   Status Partially Met     PT LONG TERM GOAL #5   Title Patient to demonstrate full cervical ROM in order to improve functional task performance and to assist in maintaining pain reduction    Baseline 1/16- appears stiff with rotation and lateral flexion but patient reports being a little stiff is his baseline               Plan - 02/05/16 1112    Clinical Impression Statement Pt at 8 week pos-op, session focus based upon MD protocol.  Began session with AAROM to improve range with flexion, abd and ER.  Manual soft tissue mobilization technqiues complete to reduce fascial restricitons anterior shoulder to improve mobility.  Progressed scapular stabilization exercises with therapist faciliation to reduce compensation for maximal strengthening beneftis.  No reports of pain through session, was limited by fatigue with activity.     Rehab Potential Good   Clinical Impairments Affecting Rehab Potential extensive surgical procedure    PT Frequency --  3x/week for 2 weeks then 2x/week for 2 weeks   PT Duration 4 weeks   PT Treatment/Interventions ADLs/Self Care Home Management;Biofeedback;Cryotherapy;Moist Heat;Functional mobility training;Therapeutic activities;Therapeutic exercise;Balance training;Neuromuscular re-education;Patient/family education;Manual techniques;Passive range of motion;Energy conservation;Dry needling;Taping   PT Next Visit Plan Reassess prior MD apt next session.  Pt at week 8 on 02/05/2016.  week 6-8 of shoulder protocol: Need to focus on ER/ABD ROM, scapular strength/stability in prone or closed chain positions   PT Home Exercise Plan shoulder isometrics all directions 5x10 sec each, flexion AAROM in supine; 01/23/16 Added  Shoulder IR towel stretch       Patient will benefit from skilled therapeutic intervention in order to improve the following deficits and impairments:  Decreased skin integrity, Improper body  mechanics, Pain, Decreased mobility, Postural dysfunction, Decreased range of motion, Decreased strength, Hypomobility, Impaired flexibility  Visit Diagnosis: Acute pain of left shoulder  Stiffness of left shoulder, not elsewhere classified  Abnormal posture  Other symptoms and signs involving the musculoskeletal system     Problem List Patient Active Problem List   Diagnosis Date Noted  . Common migraine 12/23/2014  . Hx of adenomatous colonic polyps 05/09/2014  . Obesity (BMI 30-39.9) 07/30/2013  . Type 2 diabetes mellitus with circulatory disorder ([place]) 05/27/2011  . Hyperlipidemia associated with type 2 diabetes mellitus ([place]) 10/19/2008  . Essential hypertension, benign 10/19/2008  . CORONARY ATHEROSCLEROSIS NATIVE CORONARY ARTERY 10/17/2008  . GERD (gastroesophageal reflux disease) 12/27/2007   Ihor Austin, LPTA; CBIS [phone]  Aldona Lento 02/05/2016, 11:36 AM  [place], [place], [address] Phone: [phone]   Fax:  [phone]  Name: [name] MRN: [id number] Date of Birth: [birth year]

## 2016-02-06 ENCOUNTER — Ambulatory Visit (HOSPITAL_COMMUNITY): Payer: Medicare Other

## 2016-02-06 DIAGNOSIS — M25512 Pain in left shoulder: Secondary | ICD-10-CM

## 2016-02-06 DIAGNOSIS — R293 Abnormal posture: Secondary | ICD-10-CM | POA: Diagnosis not present

## 2016-02-06 DIAGNOSIS — R29898 Other symptoms and signs involving the musculoskeletal system: Secondary | ICD-10-CM

## 2016-02-06 DIAGNOSIS — M25612 Stiffness of left shoulder, not elsewhere classified: Secondary | ICD-10-CM

## 2016-02-06 NOTE — Therapy (Signed)
[place], [place], [address] Phone: [phone]   Fax:  [phone]  Physical Therapy Treatment  Patient Details  Name: [name] MRN: [id number] Date of Birth: 16-Mar-1940 Referring Provider: Sydnee Cabal   Encounter Date: 02/06/2016      PT End of Session - 02/06/16 0819    Visit Number 17   Number of Visits 22   Date for PT Re-Evaluation 02/17/16   Authorization Type U[employer] Medicare (G-codes updated 13th visit)   Authorization Time Period 88/8/28 to 0/03/49; recert done on 1/79   Authorization - Visit Number 17   Authorization - Number of Visits 23   PT Start Time 0817   PT Stop Time 0906   PT Time Calculation (min) 49 min   Activity Tolerance Patient tolerated treatment well   Behavior During Therapy [facility] for tasks assessed/performed      Past Medical History:  Diagnosis Date  . Acute encephalopathy 08/2010  . Adenomatous polyp   . Anxiety disorder   . Arthritis   . Chronic headaches   . Chronic headaches   . Common migraine 12/23/2014  . Coronary atherosclerosis of native coronary artery    BMS nondominant RCA 12/2004  . Diverticulosis   . Essential hypertension, benign   . Gallbladder cholesterolosis   . GERD (gastroesophageal reflux disease)   . Hyperlipidemia   . Internal hemorrhoids   . Myocardial infarction    2006/1 stent  . Nephrolithiasis   . Shoulder pain, right   . Type 2 diabetes mellitus ([place])     Past Surgical History:  Procedure Laterality Date  . CATARACT EXTRACTION    . CHOLECYSTECTOMY    . COLONOSCOPY  09/10/2008   normal  . CYSTOSCOPY W/ URETEROSCOPY W/ LITHOTRIPSY  08/2010  . JOINT REPLACEMENT     right hip  . LEFT HEART CATHETERIZATION WITH CORONARY ANGIOGRAM N/A 05/14/2011   Procedure: LEFT HEART CATHETERIZATION WITH CORONARY ANGIOGRAM;  Surgeon: Sherren Mocha, MD;  Location: [facility] CATH LAB;  Service: Cardiovascular;  Laterality: N/A;  . ROTATOR CUFF REPAIR     right  . ROTATOR CUFF REPAIR Left 12/11/2015  . TOTAL KNEE ARTHROPLASTY     Bil  . UPPER GASTROINTESTINAL ENDOSCOPY  01/08/2008   bx, inlet patch, duodenitis  . YAG LASER APPLICATION Right 1/50/5697   Procedure: YAG LASER APPLICATION;  Surgeon: Williams Che, MD;  Location: [facility];  Service: Ophthalmology;  Laterality: Right;    There were no vitals filed for this visit.      Subjective Assessment - 02/06/16 0815    Subjective Pt stated his shoulder is feeling good today, no reports of pain.   Pertinent History HTN, DM, GERD, anxiety, MI, cardiac cath, R hip replacement, R RCR repair    Patient Stated Goals reduce pain, get L shoulder working well again    Currently in Pain? No/denies            [facility] PT Assessment - 02/06/16 0001      ROM / Strength   AROM / PROM / Strength AROM     AROM   Overall AROM Comments --   AROM Assessment Site Shoulder;Cervical   Right/Left Shoulder Left   Left Shoulder Flexion 144 Degrees  AROM seated; was 160 degrees per another PT, unsure position   Left Shoulder ABduction 152 Degrees  AROM seated was 150    Left Shoulder Internal Rotation 85 Degrees  IR AROM 85 degrees in seated; cueing  for compensaiton   Left Shoulder External Rotation 68 Degrees  AROM seated position with cueing for compensation; was 60    Cervical - Right Rotation 52   Cervical - Left Rotation 55                     [facility] Adult PT Treatment/Exercise - 02/06/16 0001      Shoulder Exercises: Supine   Flexion AAROM;10 reps   ABduction AAROM;10 reps     Shoulder Exercises: Seated   Other Seated Exercises IR with towel     Shoulder Exercises: Sidelying   External Rotation Left;15 reps     Shoulder Exercises: Standing   Other Standing Exercises ER at wall     Shoulder Exercises: ROM/Strengthening   UBE (Upper Arm Bike) 3' forward; 3' backward L1     Manual Therapy   Manual Therapy Soft tissue mobilization;Passive ROM   Manual therapy  comments separate rest of session   Myofascial Release Anterior deltoid; infraspinatus/teres minor   Passive ROM AAROM flexion with end range stretch x10 reps                  PT Short Term Goals - 02/06/16 4782      PT SHORT TERM GOAL #1   Title Patient to demonstrate L shoulder flexion of 120 degrees and ABD of 110 degrees in order to show improvement of area per protocol and to reduce pain    Status Achieved     PT SHORT TERM GOAL #2   Title Patient to demonstrate L shoulder IR as unlimited and L shoulder ER of 45 degrees in order to show progression per protocol and to reduce pain    Baseline 1/16- 60 at 60 degrees ABD    Status Achieved     PT SHORT TERM GOAL #3   Title Patient to be able to maintain correct posture at least 75% of the time in order to correct muscle imbalances and reduce pain    Status Partially Met     PT SHORT TERM GOAL #4   Title Patient to be independent in correctly and consistently performing appropriate HEP, to be updated PRN    Baseline 1/26:  Reports he completes HEP when he thinks of it, reports compliance with HEP 3 days a week plus the rehab days   Status On-going           PT Long Term Goals - 02/06/16 9562      PT LONG TERM GOAL #1   Title Patient to demonstrate L shoulder ROM for flexion, ABD, IR, and ER as being full and unlimited to reduce pain and improve functional task performance    Time 6   Period Weeks   Status On-going     PT LONG TERM GOAL #2   Title Patient to demonstrate functional strength in all tested groups as being 4/5 in order to improve functional task performance skills    Time 6   Status On-going     PT LONG TERM GOAL #3   Title Patient to experience pain no more than 2/10 in order to improve functional and QOL    Baseline 1/26:  Pain scale 0-2/10 during functional activities and QOL   Time 6   Period Weeks   Status On-going     PT LONG TERM GOAL #4   Title Patient to be independent in all ADLs  and self-care based activities in order to improve independence and functional performance  Baseline 02/06/2016:  stated improvements with ADLs, does require some help putting on coat,  hasn't picked up items off floor    Status On-going     PT LONG TERM GOAL #5   Title Patient to demonstrate full cervical ROM in order to improve functional task performance and to assist in maintaining pain reduction    Status On-going               Plan - 02/06/16 1047    Clinical Impression Statement Pt at 8 week post-op, session focus on improving ROM and strengthening per MD protocol.  Measurements taken this session prior MD apt next session.  Pt is progressing well with improved ROM.  Continues to be restricted with ER/IR and lacking somewhat abduction and flexion though now at functional ranges.  Pt stated functional improvements including putting on seat belt and stated he drove his tractor yesterday for the first time with minimal difficulty.  Pt stated he continues to have difficulty putting on coats and abilty to scratch his back.  Sesion included manual soft tissue mobilization techniques to reduce fascial restrictions anterior and posterior shoulder to improve mobilty.  Pt improving form with ROM based activities though still required therapist facilitatoin to reduce compensation.  No reports of pain through session, was limited by fatigue with activiity.     Rehab Potential Good   Clinical Impairments Affecting Rehab Potential extensive surgical procedure    PT Frequency --  3x/week for 2 weeks then 2x/week for additional 2 weeks   PT Duration 4 weeks   PT Treatment/Interventions ADLs/Self Care Home Management;Biofeedback;Cryotherapy;Moist Heat;Functional mobility training;Therapeutic activities;Therapeutic exercise;Balance training;Neuromuscular re-education;Patient/family education;Manual techniques;Passive range of motion;Energy conservation;Dry needling;Taping   PT Next Visit Plan F/U with  MD.  Pt at week 8 on 1/25/218.  Continue focus on ER/ABD ROM, scapular strengthen/stability (trial prone) and closed chain position.  Progress per MD protocol.        Patient will benefit from skilled therapeutic intervention in order to improve the following deficits and impairments:     Visit Diagnosis: Acute pain of left shoulder  Stiffness of left shoulder, not elsewhere classified  Abnormal posture  Other symptoms and signs involving the musculoskeletal system     Problem List Patient Active Problem List   Diagnosis Date Noted  . Common migraine 12/23/2014  . Hx of adenomatous colonic polyps 05/09/2014  . Obesity (BMI 30-39.9) 07/30/2013  . Type 2 diabetes mellitus with circulatory disorder ([place]) 05/27/2011  . Hyperlipidemia associated with type 2 diabetes mellitus ([place]) 10/19/2008  . Essential hypertension, benign 10/19/2008  . CORONARY ATHEROSCLEROSIS NATIVE CORONARY ARTERY 10/17/2008  . GERD (gastroesophageal reflux disease) 12/27/2007   Ihor Austin, LPTA; [place]  Aldona Lento 02/06/2016, 11:08 AM  [place], [place], [address] Phone: [phone]   Fax:  [phone]  Name: [name] MRN: [id number] Date of Birth: [birth year]

## 2016-02-11 ENCOUNTER — Ambulatory Visit (HOSPITAL_COMMUNITY): Payer: Medicare Other

## 2016-02-11 DIAGNOSIS — R293 Abnormal posture: Secondary | ICD-10-CM

## 2016-02-11 DIAGNOSIS — M25612 Stiffness of left shoulder, not elsewhere classified: Secondary | ICD-10-CM

## 2016-02-11 DIAGNOSIS — M25512 Pain in left shoulder: Secondary | ICD-10-CM | POA: Diagnosis not present

## 2016-02-11 DIAGNOSIS — R29898 Other symptoms and signs involving the musculoskeletal system: Secondary | ICD-10-CM | POA: Diagnosis not present

## 2016-02-11 NOTE — Therapy (Signed)
[place], [place], [address] Phone: [phone]   Fax:  [phone]  Physical Therapy Treatment  Patient Details  Name: [name] MRN: [id number] Date of Birth: January 25, 1940 Referring Provider: Sydnee Cabal   Encounter Date: 02/11/2016      PT End of Session - 02/11/16 0832    Visit Number 18   Number of Visits 22   Date for PT Re-Evaluation 02/17/16   Authorization Type U[employer] Medicare (G-codes updated 13th visit)   Authorization Time Period 22/2/97 to 9/89/21; recert done on 1/94   Authorization - Visit Number 18   Authorization - Number of Visits 23   PT Start Time 0817   PT Stop Time 0905   PT Time Calculation (min) 48 min   Activity Tolerance Patient tolerated treatment well   Behavior During Therapy [facility] for tasks assessed/performed      Past Medical History:  Diagnosis Date  . Acute encephalopathy 08/2010  . Adenomatous polyp   . Anxiety disorder   . Arthritis   . Chronic headaches   . Chronic headaches   . Common migraine 12/23/2014  . Coronary atherosclerosis of native coronary artery    BMS nondominant RCA 12/2004  . Diverticulosis   . Essential hypertension, benign   . Gallbladder cholesterolosis   . GERD (gastroesophageal reflux disease)   . Hyperlipidemia   . Internal hemorrhoids   . Myocardial infarction    2006/1 stent  . Nephrolithiasis   . Shoulder pain, right   . Type 2 diabetes mellitus ([place])     Past Surgical History:  Procedure Laterality Date  . CATARACT EXTRACTION    . CHOLECYSTECTOMY    . COLONOSCOPY  09/10/2008   normal  . CYSTOSCOPY W/ URETEROSCOPY W/ LITHOTRIPSY  08/2010  . JOINT REPLACEMENT     right hip  . LEFT HEART CATHETERIZATION WITH CORONARY ANGIOGRAM N/A 05/14/2011   Procedure: LEFT HEART CATHETERIZATION WITH CORONARY ANGIOGRAM;  Surgeon: Sherren Mocha, MD;  Location[facility] C[facility] LAB;  Service: Cardiovascular;  Laterality: N/A;  . ROTATOR CUFF REPAIR      right  . ROTATOR CUFF REPAIR Left 12/11/2015  . TOTAL KNEE ARTHROPLASTY     Bil  . UPPER GASTROINTESTINAL ENDOSCOPY  01/08/2008   bx, inlet patch, duodenitis  . YAG LASER APPLICATION Right 1/74/0814   Procedure: YAG LASER APPLICATION;  Surgeon: Williams Che, MD;  Location: [facility];  Service: Ophthalmology;  Laterality: Right;    There were no vitals filed for this visit.      Subjective Assessment - 02/11/16 0820    Subjective Pt stated his shoulder is feeling good today unless makes a certain movement.  Pt stated MD was happy with progress, sent referral for 2-3x/week for 4 weeks   Pertinent History HTN, DM, GERD, anxiety, MI, cardiac cath, R hip replacement, R RCR repair    Patient Stated Goals reduce pain, get L shoulder working well again    Currently in Pain? No/denies                         [facility] Adult PT Treatment/Exercise - 02/11/16 0001      Shoulder Exercises: Supine   Flexion AAROM;10 reps;AROM   ABduction AAROM;AROM;10 reps     Shoulder Exercises: Sidelying   External Rotation Left;15 reps   ABduction Left;15 reps;AAROM     Shoulder Exercises: Standing   Flexion 10 reps   Other Standing Exercises wall  push-ups   Other Standing Exercises ball on wall ABC's 2 sets     Shoulder Exercises: ROM/Strengthening   UBE (Upper Arm Bike) 3' forward; 3' backward L1   Wall Pushups 10 reps   Ball on Wall ABC 2 sets with small ball     Manual Therapy   Manual Therapy Soft tissue mobilization;Passive ROM   Manual therapy comments separate rest of session   Myofascial Release Anterior deltoid; infraspinatus/teres minor   Passive ROM AAROM flexion with end range stretch x10 reps                  PT Short Term Goals - 02/06/16 0819      PT SHORT TERM GOAL #1   Title Patient to demonstrate L shoulder flexion of 120 degrees and ABD of 110 degrees in order to show improvement of area per protocol and to reduce pain    Status Achieved     PT  SHORT TERM GOAL #2   Title Patient to demonstrate L shoulder IR as unlimited and L shoulder ER of 45 degrees in order to show progression per protocol and to reduce pain    Baseline 1/16- 60 at 60 degrees ABD    Status Achieved     PT SHORT TERM GOAL #3   Title Patient to be able to maintain correct posture at least 75% of the time in order to correct muscle imbalances and reduce pain    Status Partially Met     PT SHORT TERM GOAL #4   Title Patient to be independent in correctly and consistently performing appropriate HEP, to be updated PRN    Baseline 1/26:  Reports he completes HEP when he thinks of it, reports compliance with HEP 3 days a week plus the rehab days   Status On-going           PT Long Term Goals - 02/06/16 7353      PT LONG TERM GOAL #1   Title Patient to demonstrate L shoulder ROM for flexion, ABD, IR, and ER as being full and unlimited to reduce pain and improve functional task performance    Time 6   Period Weeks   Status On-going     PT LONG TERM GOAL #2   Title Patient to demonstrate functional strength in all tested groups as being 4/5 in order to improve functional task performance skills    Time 6   Status On-going     PT LONG TERM GOAL #3   Title Patient to experience pain no more than 2/10 in order to improve functional and QOL    Baseline 1/26:  Pain scale 0-2/10 during functional activities and QOL   Time 6   Period Weeks   Status On-going     PT LONG TERM GOAL #4   Title Patient to be independent in all ADLs and self-care based activities in order to improve independence and functional performance    Baseline 02/06/2016:  stated improvements with ADLs, does require some help putting on coat,  hasn't picked up items off floor    Status On-going     PT LONG TERM GOAL #5   Title Patient to demonstrate full cervical ROM in order to improve functional task performance and to assist in maintaining pain reduction    Status On-going                Plan - 02/11/16 0902    Clinical Impression Statement DPT aware of referral  to continue PT for 4 more weeks.  Continued session focus per MD protocol to address shoulder ROM and scapular stabilization.  Pt improving AROM with therapist facilitation to address compensation, progressed to AROM in standing as well as wall push-ups for scapular stability.  Manual soft tissue mobilization complete to address scar tissue and tight musculature to improve range with less pain.  No reports of pain at EOS.     Rehab Potential Good   Clinical Impairments Affecting Rehab Potential extensive surgical procedure    PT Frequency 2x / week   PT Duration 4 weeks   PT Treatment/Interventions ADLs/Self Care Home Management;Biofeedback;Cryotherapy;Moist Heat;Functional mobility training;Therapeutic activities;Therapeutic exercise;Balance training;Neuromuscular re-education;Patient/family education;Manual techniques;Passive range of motion;Energy conservation;Dry needling;Taping   PT Next Visit Plan Pt at 8 week as on 1/25.  Continue focus on ER/ABD ROM, scapular strengthen/stability (trial prone) and closed chain position.  Progress per MD protocol.        Patient will benefit from skilled therapeutic intervention in order to improve the following deficits and impairments:  Decreased skin integrity, Improper body mechanics, Pain, Decreased mobility, Postural dysfunction, Decreased range of motion, Decreased strength, Hypomobility, Impaired flexibility  Visit Diagnosis: Acute pain of left shoulder  Stiffness of left shoulder, not elsewhere classified  Abnormal posture  Other symptoms and signs involving the musculoskeletal system     Problem List Patient Active Problem List   Diagnosis Date Noted  . Common migraine 12/23/2014  . Hx of adenomatous colonic polyps 05/09/2014  . Obesity (BMI 30-39.9) 07/30/2013  . Type 2 diabetes mellitus with circulatory disorder ([place]) 05/27/2011  .  Hyperlipidemia associated with type 2 diabetes mellitus ([place]) 10/19/2008  . Essential hypertension, benign 10/19/2008  . CORONARY ATHEROSCLEROSIS NATIVE CORONARY ARTERY 10/17/2008  . GERD (gastroesophageal reflux disease) 12/27/2007   Ihor Austin, [place]; [place]  Aldona Lento 02/11/2016, 9:27 AM  [place], [place], [address] Phone: [phone]   Fax:  [phone]  Name: [name] MRN: [id number] Date of Birth: 08/13/1940

## 2016-02-13 ENCOUNTER — Ambulatory Visit (HOSPITAL_COMMUNITY): Payer: Medicare Other | Attending: Specialist

## 2016-02-13 DIAGNOSIS — R29898 Other symptoms and signs involving the musculoskeletal system: Secondary | ICD-10-CM | POA: Diagnosis not present

## 2016-02-13 DIAGNOSIS — R293 Abnormal posture: Secondary | ICD-10-CM | POA: Insufficient documentation

## 2016-02-13 DIAGNOSIS — M25612 Stiffness of left shoulder, not elsewhere classified: Secondary | ICD-10-CM | POA: Diagnosis not present

## 2016-02-13 DIAGNOSIS — M25512 Pain in left shoulder: Secondary | ICD-10-CM | POA: Insufficient documentation

## 2016-02-13 NOTE — Therapy (Signed)
[place], [place], [address] Phone: [phone]   Fax:  [phone]  Physical Therapy Treatment  Patient Details  Name: [name] MRN: [id number] Date of Birth: 26-Apr-1940 Referring Provider: Sydnee Cabal   Encounter Date: 02/13/2016      PT End of Session - 02/13/16 1652    Visit Number 19   Number of Visits 22   Date for PT Re-Evaluation 02/17/16   Authorization Type U[employer] Medicare (G-codes updated 13th visit)   Authorization Time Period 49/7/02 to 6/37/85; recert done on 8/85   Authorization - Visit Number 19   Authorization - Number of Visits 23   PT Start Time 0277   PT Stop Time 1732   PT Time Calculation (min) 42 min   Activity Tolerance Patient tolerated treatment well   Behavior During Therapy [facility] for tasks assessed/performed      Past Medical History:  Diagnosis Date  . Acute encephalopathy 08/2010  . Adenomatous polyp   . Anxiety disorder   . Arthritis   . Chronic headaches   . Chronic headaches   . Common migraine 12/23/2014  . Coronary atherosclerosis of native coronary artery    BMS nondominant RCA 12/2004  . Diverticulosis   . Essential hypertension, benign   . Gallbladder cholesterolosis   . GERD (gastroesophageal reflux disease)   . Hyperlipidemia   . Internal hemorrhoids   . Myocardial infarction    2006/1 stent  . Nephrolithiasis   . Shoulder pain, right   . Type 2 diabetes mellitus ([place])     Past Surgical History:  Procedure Laterality Date  . CATARACT EXTRACTION    . CHOLECYSTECTOMY    . COLONOSCOPY  09/10/2008   normal  . CYSTOSCOPY W/ URETEROSCOPY W/ LITHOTRIPSY  08/2010  . JOINT REPLACEMENT     right hip  . LEFT HEART CATHETERIZATION WITH CORONARY ANGIOGRAM N/A 05/14/2011   Procedure: LEFT HEART CATHETERIZATION WITH CORONARY ANGIOGRAM;  Surgeon: Sherren Mocha, MD;  Location: [facility] CATH LAB;  Service: Cardiovascular;  Laterality: N/A;  . ROTATOR CUFF REPAIR     right  . ROTATOR CUFF REPAIR Left 12/11/2015  . TOTAL KNEE ARTHROPLASTY     Bil  . UPPER GASTROINTESTINAL ENDOSCOPY  01/08/2008   bx, inlet patch, duodenitis  . YAG LASER APPLICATION Right 04/22/8784   Procedure: YAG LASER APPLICATION;  Surgeon: Williams Che, MD;  Location: [facility] ORS;  Service: Ophthalmology;  Laterality: Right;    There were no vitals filed for this visit.      Subjective Assessment - 02/13/16 1651    Subjective Pt stated his shoulder is pain free unless makes a certain movement.   Pertinent History HTN, DM, GERD, anxiety, MI, cardiac cath, R hip replacement, R RCR repair    Patient Stated Goals reduce pain, get L shoulder working well again    Currently in Pain? No/denies             [facility] Adult PT Treatment/Exercise - 02/13/16 0001      Shoulder Exercises: Supine   Flexion AROM;10 reps   Flexion Limitations full ROM      Shoulder Exercises: Sidelying   External Rotation Left;15 reps   ABduction Left;15 reps;AROM     Shoulder Exercises: Standing   Flexion 15 reps   ABduction 15 reps   ABduction Limitations infront of wall to improve form, equal AROM to Rt shoulder     Shoulder Exercises: ROM/Strengthening   UBE (Upper Arm Bike)  3' forward; 3' backward L1   Wall Wash 2 sets 10 reps each direction   Wall Pushups 15 reps     Manual Therapy   Manual Therapy Soft tissue mobilization;Passive ROM   Manual therapy comments separate rest of session   Soft tissue mobilization to cervical musculture to decrease spasm   Myofascial Release Anterior deltoid; infraspinatus/teres minor   Passive ROM AAROM flexion with end range stretch x10 reps                  PT Short Term Goals - 02/06/16 0819      PT SHORT TERM GOAL #1   Title Patient to demonstrate L shoulder flexion of 120 degrees and ABD of 110 degrees in order to show improvement of area per protocol and to reduce pain    Status Achieved     PT SHORT TERM GOAL #2   Title Patient to  demonstrate L shoulder IR as unlimited and L shoulder ER of 45 degrees in order to show progression per protocol and to reduce pain    Baseline 1/16- 60 at 60 degrees ABD    Status Achieved     PT SHORT TERM GOAL #3   Title Patient to be able to maintain correct posture at least 75% of the time in order to correct muscle imbalances and reduce pain    Status Partially Met     PT SHORT TERM GOAL #4   Title Patient to be independent in correctly and consistently performing appropriate HEP, to be updated PRN    Baseline 1/26:  Reports he completes HEP when he thinks of it, reports compliance with HEP 3 days a week plus the rehab days   Status On-going           PT Long Term Goals - 02/06/16 1093      PT LONG TERM GOAL #1   Title Patient to demonstrate L shoulder ROM for flexion, ABD, IR, and ER as being full and unlimited to reduce pain and improve functional task performance    Time 6   Period Weeks   Status On-going     PT LONG TERM GOAL #2   Title Patient to demonstrate functional strength in all tested groups as being 4/5 in order to improve functional task performance skills    Time 6   Status On-going     PT LONG TERM GOAL #3   Title Patient to experience pain no more than 2/10 in order to improve functional and QOL    Baseline 1/26:  Pain scale 0-2/10 during functional activities and QOL   Time 6   Period Weeks   Status On-going     PT LONG TERM GOAL #4   Title Patient to be independent in all ADLs and self-care based activities in order to improve independence and functional performance    Baseline 02/06/2016:  stated improvements with ADLs, does require some help putting on coat,  hasn't picked up items off floor    Status On-going     PT LONG TERM GOAL #5   Title Patient to demonstrate full cervical ROM in order to improve functional task performance and to assist in maintaining pain reduction    Status On-going               Plan - 02/13/16 1720     Clinical Impression Statement Pt at 9 week post-op.  Continued session focus on shoulder ROM and scapular stabilizatin per MD protocol.  Pt presents with improved form with AROM activities and equal AROM to opposite UE with flexion and abduction, continues to be limited with ER.  Pt stated some pain with end range movements.  Manual soft tissue mobilization complete to address scar tissue and tight anterior deltoid, pecs and scapular musculature for pain and ROM.  No reports of pain at EOS.     Rehab Potential Good   Clinical Impairments Affecting Rehab Potential extensive surgical procedure    PT Frequency 2x / week   PT Duration 4 weeks   PT Treatment/Interventions ADLs/Self Care Home Management;Biofeedback;Cryotherapy;Moist Heat;Functional mobility training;Therapeutic activities;Therapeutic exercise;Balance training;Neuromuscular re-education;Patient/family education;Manual techniques;Passive range of motion;Energy conservation;Dry needling;Taping   PT Next Visit Plan Pt at 9 week post-op.  Add rotator cuff PRE strengthening next sessoin.  Continue focus on ER/ABD ROM, scapular strengthen/stability (trial prone) and closed chain position.  Progress per MD protocol.        Patient will benefit from skilled therapeutic intervention in order to improve the following deficits and impairments:  Decreased skin integrity, Improper body mechanics, Pain, Decreased mobility, Postural dysfunction, Decreased range of motion, Decreased strength, Hypomobility, Impaired flexibility  Visit Diagnosis: Acute pain of left shoulder  Stiffness of left shoulder, not elsewhere classified  Other symptoms and signs involving the musculoskeletal system  Abnormal posture     Problem List Patient Active Problem List   Diagnosis Date Noted  . Common migraine 12/23/2014  . Hx of adenomatous colonic polyps 05/09/2014  . Obesity (BMI 30-39.9) 07/30/2013  . Type 2 diabetes mellitus with circulatory disorder ([place])  05/27/2011  . Hyperlipidemia associated with type 2 diabetes mellitus ([place]) 10/19/2008  . Essential hypertension, benign 10/19/2008  . CORONARY ATHEROSCLEROSIS NATIVE CORONARY ARTERY 10/17/2008  . GERD (gastroesophageal reflux disease) 12/27/2007   Ihor Austin, LPTA; CBIS [phone]  Aldona Lento 02/13/2016, 6:21 PM  [place] [address] [place], [place], [address] Phone: [phone]   Fax:  [phone]  Name: [name] MRN: [id number] Date of Birth: 07-Aug-1940

## 2016-02-17 ENCOUNTER — Ambulatory Visit (HOSPITAL_COMMUNITY): Payer: Medicare Other | Admitting: Physical Therapy

## 2016-02-17 DIAGNOSIS — M25512 Pain in left shoulder: Secondary | ICD-10-CM

## 2016-02-17 DIAGNOSIS — R29898 Other symptoms and signs involving the musculoskeletal system: Secondary | ICD-10-CM

## 2016-02-17 DIAGNOSIS — R293 Abnormal posture: Secondary | ICD-10-CM | POA: Diagnosis not present

## 2016-02-17 DIAGNOSIS — M25612 Stiffness of left shoulder, not elsewhere classified: Secondary | ICD-10-CM | POA: Diagnosis not present

## 2016-02-17 NOTE — Therapy (Signed)
[name], [place], [address] Phone: [phone]   Fax:  [phone]  Physical Therapy Treatment (Re-Assessment)  Patient Details  Name: [name] MRN: [id number] Date of Birth: July 26, 1940 Referring Provider: Sydnee Cabal  Encounter Date: 02/17/2016      PT End of Session - 02/17/16 1227    Visit Number 20   Number of Visits 26   Date for PT Re-Evaluation 03/09/16   Authorization Type U[employer] Medicare (G-codes updated 20th visit)   Authorization Time Period 76/7/34 to 1/93/79; recert done on 0/24   Authorization - Visit Number 20   Authorization - Number of Visits 30   PT Start Time 0818   PT Stop Time 0859   PT Time Calculation (min) 41 min   Activity Tolerance Patient tolerated treatment well   Behavior During Therapy [facility] for tasks assessed/performed      Past Medical History:  Diagnosis Date  . Acute encephalopathy 08/2010  . Adenomatous polyp   . Anxiety disorder   . Arthritis   . Chronic headaches   . Chronic headaches   . Common migraine 12/23/2014  . Coronary atherosclerosis of native coronary artery    BMS nondominant RCA 12/2004  . Diverticulosis   . Essential hypertension, benign   . Gallbladder cholesterolosis   . GERD (gastroesophageal reflux disease)   . Hyperlipidemia   . Internal hemorrhoids   . Myocardial infarction    2006/1 stent  . Nephrolithiasis   . Shoulder pain, right   . Type 2 diabetes mellitus ([place])     Past Surgical History:  Procedure Laterality Date  . CATARACT EXTRACTION    . CHOLECYSTECTOMY    . COLONOSCOPY  09/10/2008   normal  . CYSTOSCOPY W/ URETEROSCOPY W/ LITHOTRIPSY  08/2010  . JOINT REPLACEMENT     right hip  . LEFT HEART CATHETERIZATION WITH CORONARY ANGIOGRAM N/A 05/14/2011   Procedure: LEFT HEART CATHETERIZATION WITH CORONARY ANGIOGRAM;  Surgeon: Sherren Mocha, MD;  Location: [facility] CATH LAB;  Service: Cardiovascular;  Laterality: N/A;  . ROTATOR  CUFF REPAIR     right  . ROTATOR CUFF REPAIR Left 12/11/2015  . TOTAL KNEE ARTHROPLASTY     Bil  . UPPER GASTROINTESTINAL ENDOSCOPY  01/08/2008   bx, inlet patch, duodenitis  . YAG LASER APPLICATION Right 0/97/3532   Procedure: YAG LASER APPLICATION;  Surgeon: Williams Che, MD;  Location: [facility] ORS;  Service: Ophthalmology;  Laterality: Right;    There were no vitals filed for this visit.      Subjective Assessment - 02/17/16 0820    Subjective Patient arrives today stating he is doing well, the hardest thing for him to do is put his arm behind his back; he can do things like put his wallet in his pocket but cannot reach higher up his back. He noticed last night that both shoulders have been hurting. He stilll cannot lay on his L shoulder for long.    Pertinent History HTN, DM, GERD, anxiety, MI, cardiac cath, R hip replacement, R RCR repair    Patient Stated Goals reduce pain, get L shoulder working well again    Currently in Pain? No/denies            [facility] PT Assessment - 02/17/16 0001      Assessment   Medical Diagnosis Lt shoulder repair   Referring Provider Sydnee Cabal   Onset Date/Surgical Date 12/11/15   Next MD Visit Dr. Theda Sers after PT is  over    Prior Therapy none      Balance Screen   Has the patient fallen in the past 6 months No   Has the patient had a decrease in activity level because of a fear of falling?  No   Is the patient reluctant to leave their home because of a fear of falling?  No     Prior Function   Level of Independence Independent;Independent with basic ADLs;Independent with gait;Independent with transfers   Vocation Retired     Observation/Other Assessments   Focus on Therapeutic Outcomes (FOTO)  38% limited (was 55% limited)     Posture/Postural Control   Posture/Postural Control Postural limitations   Postural Limitations Rounded Shoulders;Forward head;Flexed trunk;Increased thoracic kyphosis     AROM   Left Shoulder Flexion 161  Degrees  supine    Left Shoulder ABduction 165 Degrees  supine    Left Shoulder Internal Rotation 79 Degrees   Left Shoulder External Rotation 89 Degrees     Strength   Overall Strength Comments grip strength appears symmetrical    Right Shoulder Flexion 5/5   Right Shoulder ABduction 4+/5   Right Shoulder Internal Rotation 5/5   Right Shoulder External Rotation 5/5   Left Shoulder Flexion 4+/5   Left Shoulder ABduction 4/5   Left Shoulder Internal Rotation 4/5   Left Shoulder External Rotation 4/5   Right Elbow Flexion 5/5   Right Elbow Extension 4+/5   Left Elbow Flexion 4+/5   Left Elbow Extension 4+/5                     [facility] Adult PT Treatment/Exercise - 02/17/16 0001      Shoulder Exercises: Seated   Flexion Left;10 reps   Flexion Limitations cues for form/reduced compensations      Shoulder Exercises: Standing   Flexion 15 reps     Manual Therapy   Manual Therapy Soft tissue mobilization   Manual therapy comments separate rest of session   Soft tissue mobilization anterior L biceps, also triceps                 PT Education - 02/17/16 1226    Education provided Yes   Education Details progress with skilled PT services, POC moving forward, HEP updates    Person(s) Educated Patient   Methods Handout;Explanation   Comprehension Need further instruction;Verbalized understanding  left HEP handouts at PT clinic           PT Short Term Goals - 02/17/16 0831      PT SHORT TERM GOAL #1   Title Patient to demonstrate L shoulder flexion of 120 degrees and ABD of 110 degrees in order to show improvement of area per protocol and to reduce pain    Time 3   Period Weeks   Status Achieved     PT SHORT TERM GOAL #2   Title Patient to demonstrate L shoulder IR as unlimited and L shoulder ER of 45 degrees in order to show progression per protocol and to reduce pain    Time 3   Period Weeks   Status Achieved     PT SHORT TERM GOAL #3   Title  Patient to be able to maintain correct posture at least 75% of the time in order to correct muscle imbalances and reduce pain    Time 3   Period Weeks   Status Partially Met     PT SHORT TERM GOAL #4  Title Patient to be independent in correctly and consistently performing appropriate HEP, to be updated PRN    Baseline 2/6- when he is not at PT   Time 2   Period Weeks   Status Partially Met           PT Long Term Goals - 02/17/16 1194      PT LONG TERM GOAL #1   Title Patient to demonstrate L shoulder ROM for flexion, ABD, IR, and ER as being full and unlimited to reduce pain and improve functional task performance    Time 6   Period Weeks   Status Partially Met     PT LONG TERM GOAL #2   Title Patient to demonstrate functional strength in all tested groups as being 4/5 in order to improve functional task performance skills    Time 6   Period Weeks   Status Achieved     PT LONG TERM GOAL #3   Title Patient to experience pain no more than 2/10 in order to improve functional and QOL    Baseline 2/6- can get to 5-6/10 at night    Time 6   Period Weeks   Status On-going     PT LONG TERM GOAL #4   Title Patient to be independent in all ADLs and self-care based activities in order to improve independence and functional performance    Baseline 2/6- reports he is able to do all of this easily but some jackets can still be a little hard    Time 6   Period Weeks   Status Partially Met     PT LONG TERM GOAL #5   Title Patient to demonstrate full cervical ROM in order to improve functional task performance and to assist in maintaining pain reduction    Time 6   Period Weeks   Status Partially Met               Plan - 02/17/16 1228    Clinical Impression Statement Re-assessment performed today. Patient appears to be progressing well with supine ROM at/approaching WFL; he does continue to demonstrate compensations with ROM in sitting, indicating likely involvement of  poor mechanics and ongoing shoulder weakness. Noted extensive knotting along anterior L biceps and into triceps today, performed manual to address and recommended moist heat to help address at home as well especially since he reports pain in this region is limiting IR motion functionally. Recommend extension of skilled PT services to continue addressing functional mechanics, active ROM, functional task performance skills moving forward.    Rehab Potential Good   Clinical Impairments Affecting Rehab Potential extensive surgical procedure    PT Frequency 2x / week   PT Duration 3 weeks   PT Treatment/Interventions ADLs/Self Care Home Management;Biofeedback;Cryotherapy;Moist Heat;Functional mobility training;Therapeutic activities;Therapeutic exercise;Balance training;Neuromuscular re-education;Patient/family education;Manual techniques;Passive range of motion;Energy conservation;Dry needling;Taping   PT Next Visit Plan Pt at 9 week post-op.  Add rotator cuff PRE strengthening next sessoin.  Continue focus on ER/ABD ROM, scapular strengthen/stability (trial prone) and closed chain position.  Progress per MD protocol.     PT Home Exercise Plan shoulder isometrics all directions 5x10 sec each, flexion AAROM in supine; 01/23/16 Added  Shoulder IR towel stretch    Consulted and Agree with Plan of Care Patient      Patient will benefit from skilled therapeutic intervention in order to improve the following deficits and impairments:  Decreased skin integrity, Improper body mechanics, Pain, Decreased mobility, Postural dysfunction, Decreased range of  motion, Decreased strength, Hypomobility, Impaired flexibility  Visit Diagnosis: Acute pain of left shoulder - Plan: PT plan of care cert/re-cert  Stiffness of left shoulder, not elsewhere classified - Plan: PT plan of care cert/re-cert  Other symptoms and signs involving the musculoskeletal system - Plan: PT plan of care cert/re-cert  Abnormal posture - Plan:  PT plan of care cert/re-cert       G-Codes - 03/16/2016 03-23-1227    Functional Assessment Tool Used Clinical judgement based on assessment of ROM, strength and activity tolerance.    Functional Limitation Mobility: Walking and moving around   Mobility: Walking and Moving Around Current Status [phone]) At least 20 percent but less than 40 percent impaired, limited or restricted   Mobility: Walking and Moving Around Goal Status [phone]) At least 1 percent but less than 20 percent impaired, limited or restricted      Problem List Patient Active Problem List   Diagnosis Date Noted  . Common migraine 12/23/2014  . Hx of adenomatous colonic polyps 05/09/2014  . Obesity (BMI 30-39.9) 07/30/2013  . Type 2 diabetes mellitus with circulatory disorder ([place]) 05/27/2011  . Hyperlipidemia associated with type 2 diabetes mellitus ([place]) 10/19/2008  . Essential hypertension, benign 10/19/2008  . CORONARY ATHEROSCLEROSIS NATIVE CORONARY ARTERY 10/17/2008  . GERD (gastroesophageal reflux disease) 12/27/2007    Deniece Ree PT, DPT [facility] [address] [place], [place], [address] Phone: [phone]   Fax:  [phone]  Name: MARQUISE WICKE MRN: [id number] Date of Birth: [birth year]

## 2016-02-17 NOTE — Patient Instructions (Signed)
   Cane Active Shoulder Flexion in supine  Grasp the cane about shoulder width. Bring it up over your head, trying to stretch the affected shoulder, holding for a 2 count and then bring the cane back to the resting position.  Repeat 10 times, twice a day.    Supine shoulder abduction  Laying on your back, try to raise your arm up and out to the side (like you are doing a snow angel). Your thumb should be facing up towards your head.   Repeat 10 times, twice  A day.

## 2016-02-20 ENCOUNTER — Ambulatory Visit (HOSPITAL_COMMUNITY): Payer: Medicare Other

## 2016-02-20 DIAGNOSIS — R293 Abnormal posture: Secondary | ICD-10-CM

## 2016-02-20 DIAGNOSIS — M25612 Stiffness of left shoulder, not elsewhere classified: Secondary | ICD-10-CM

## 2016-02-20 DIAGNOSIS — R29898 Other symptoms and signs involving the musculoskeletal system: Secondary | ICD-10-CM | POA: Diagnosis not present

## 2016-02-20 DIAGNOSIS — M25512 Pain in left shoulder: Secondary | ICD-10-CM | POA: Diagnosis not present

## 2016-02-20 NOTE — Therapy (Signed)
[place], [place], [address] Phone: [phone]   Fax:  [phone]  Physical Therapy Treatment  Patient Details  Name: [name] MRN: [id number] Date of Birth: 06-29-1940 Referring Provider: Sydnee Cabal  Encounter Date: 02/20/2016      PT End of Session - 02/20/16 0954    Visit Number 21   Number of Visits 26   Date for PT Re-Evaluation 03/09/16   Authorization Type U[employer] Medicare (G-codes updated 20th visit)   Authorization Time Period 88/4/16 to 06/16/28; recert done on 1/60   Authorization - Visit Number 21   Authorization - Number of Visits 30   PT Start Time [phone]   PT Stop Time 1031   PT Time Calculation (min) 43 min   Activity Tolerance Patient tolerated treatment well   Behavior During Therapy [facility] for tasks assessed/performed      Past Medical History:  Diagnosis Date  . Acute encephalopathy 08/2010  . Adenomatous polyp   . Anxiety disorder   . Arthritis   . Chronic headaches   . Chronic headaches   . Common migraine 12/23/2014  . Coronary atherosclerosis of native coronary artery    BMS nondominant RCA 12/2004  . Diverticulosis   . Essential hypertension, benign   . Gallbladder cholesterolosis   . GERD (gastroesophageal reflux disease)   . Hyperlipidemia   . Internal hemorrhoids   . Myocardial infarction    2006/1 stent  . Nephrolithiasis   . Shoulder pain, right   . Type 2 diabetes mellitus ([place])     Past Surgical History:  Procedure Laterality Date  . CATARACT EXTRACTION    . CHOLECYSTECTOMY    . COLONOSCOPY  09/10/2008   normal  . CYSTOSCOPY W/ URETEROSCOPY W/ LITHOTRIPSY  08/2010  . JOINT REPLACEMENT     right hip  . LEFT HEART CATHETERIZATION WITH CORONARY ANGIOGRAM N/A 05/14/2011   Procedure: LEFT HEART CATHETERIZATION WITH CORONARY ANGIOGRAM;  Surgeon: Sherren Mocha, MD;  Location: [facility] CATH LAB;  Service: Cardiovascular;  Laterality: N/A;  . ROTATOR CUFF REPAIR     right  . ROTATOR CUFF REPAIR Left 12/11/2015  . TOTAL KNEE ARTHROPLASTY     Bil  . UPPER GASTROINTESTINAL ENDOSCOPY  01/08/2008   bx, inlet patch, duodenitis  . YAG LASER APPLICATION Right 2/35/5732   Procedure: YAG LASER APPLICATION;  Surgeon: Williams Che, MD;  Location: [facility];  Service: Ophthalmology;  Laterality: Right;    There were no vitals filed for this visit.      Subjective Assessment - 02/20/16 0953    Subjective Pt stated pain scale 4/10 burning Lt shoulder, has increased pain Rt shoulder today.     Pertinent History HTN, DM, GERD, anxiety, MI, cardiac cath, R hip replacement, R RCR repair    Patient Stated Goals reduce pain, get L shoulder working well again    Currently in Pain? Yes   Pain Score 4    Pain Location Shoulder   Pain Orientation Left   Pain Descriptors / Indicators Burning;Sharp;Stabbing  sharp/shooting pain with certain movements   Pain Type Surgical pain   Pain Onset More than a month ago   Pain Frequency Intermittent   Aggravating Factors  movement/exercises with UE   Pain Relieving Factors sitting still   Effect of Pain on Daily Activities significant limitation                         [facility]  Adult PT Treatment/Exercise - 02/20/16 0001      Shoulder Exercises: Supine   Flexion AROM;10 reps     Shoulder Exercises: Sidelying   External Rotation Left;15 reps   External Rotation Limitations towel under elbow for form   ABduction Left;15 reps;AROM     Shoulder Exercises: Pulleys   Flexion 2 minutes   ABduction 2 minutes   ABduction Limitations cueing for form     Shoulder Exercises: ROM/Strengthening   UBE (Upper Arm Bike) 3' forward; 3' backward L1   Wall Pushups 15 reps   Prot/Ret//Elev/Dep 10 each     Manual Therapy   Manual Therapy Soft tissue mobilization   Manual therapy comments separate rest of session   Soft tissue mobilization anterior L biceps, also triceps    Myofascial Release Anterior deltoid;  infraspinatus/teres minor   Passive ROM AAROM flexion with end range stretch x10 reps                  PT Short Term Goals - 02/17/16 0831      PT SHORT TERM GOAL #1   Title Patient to demonstrate L shoulder flexion of 120 degrees and ABD of 110 degrees in order to show improvement of area per protocol and to reduce pain    Time 3   Period Weeks   Status Achieved     PT SHORT TERM GOAL #2   Title Patient to demonstrate L shoulder IR as unlimited and L shoulder ER of 45 degrees in order to show progression per protocol and to reduce pain    Time 3   Period Weeks   Status Achieved     PT SHORT TERM GOAL #3   Title Patient to be able to maintain correct posture at least 75% of the time in order to correct muscle imbalances and reduce pain    Time 3   Period Weeks   Status Partially Met     PT SHORT TERM GOAL #4   Title Patient to be independent in correctly and consistently performing appropriate HEP, to be updated PRN    Baseline 2/6- when he is not at PT   Time 2   Period Weeks   Status Partially Met           PT Long Term Goals - 02/17/16 5188      PT LONG TERM GOAL #1   Title Patient to demonstrate L shoulder ROM for flexion, ABD, IR, and ER as being full and unlimited to reduce pain and improve functional task performance    Time 6   Period Weeks   Status Partially Met     PT LONG TERM GOAL #2   Title Patient to demonstrate functional strength in all tested groups as being 4/5 in order to improve functional task performance skills    Time 6   Period Weeks   Status Achieved     PT LONG TERM GOAL #3   Title Patient to experience pain no more than 2/10 in order to improve functional and QOL    Baseline 2/6- can get to 5-6/10 at night    Time 6   Period Weeks   Status On-going     PT LONG TERM GOAL #4   Title Patient to be independent in all ADLs and self-care based activities in order to improve independence and functional performance    Baseline  2/6- reports he is able to do all of this easily but some jackets can still be  a little hard    Time 6   Period Weeks   Status Partially Met     PT LONG TERM GOAL #5   Title Patient to demonstrate full cervical ROM in order to improve functional task performance and to assist in maintaining pain reduction    Time 6   Period Weeks   Status Partially Met               Plan - 02/20/16 1235    Clinical Impression Statement Pt at 10 week post-op.  Session focus on improving shoulder ROM with cueing to improve mechanics to reduce compensation and scapular strengthening.  Began scapular stability exercises as well with cueing for technique especially with protract/retraction movements.  Manual soft tissue mobilitation technqiues complete to anterior Lt biceps to reduce spasm for pain control.  Pt c/o new Rt shoulder pain with AAROM activities (pullies and standing scapular stability exercises), encouraged to contact MD if continues.  No reports of increased pain at EOS.     Rehab Potential Good   Clinical Impairments Affecting Rehab Potential extensive surgical procedure    PT Frequency 2x / week   PT Duration 3 weeks   PT Treatment/Interventions ADLs/Self Care Home Management;Biofeedback;Cryotherapy;Moist Heat;Functional mobility training;Therapeutic activities;Therapeutic exercise;Balance training;Neuromuscular re-education;Patient/family education;Manual techniques;Passive range of motion;Energy conservation;Dry needling;Taping   PT Next Visit Plan Pt at 10 week post-op.  Add rotator cuff PRE strengthening next sessoin.  Continue focus on ER/ABD ROM, scapular strengthen/stability (trial prone) and closed chain position.  Progress per MD protocol.        Patient will benefit from skilled therapeutic intervention in order to improve the following deficits and impairments:  Decreased skin integrity, Improper body mechanics, Pain, Decreased mobility, Postural dysfunction, Decreased range of  motion, Decreased strength, Hypomobility, Impaired flexibility  Visit Diagnosis: Acute pain of left shoulder  Stiffness of left shoulder, not elsewhere classified  Other symptoms and signs involving the musculoskeletal system  Abnormal posture     Problem List Patient Active Problem List   Diagnosis Date Noted  . Common migraine 12/23/2014  . Hx of adenomatous colonic polyps 05/09/2014  . Obesity (BMI 30-39.9) 07/30/2013  . Type 2 diabetes mellitus with circulatory disorder ([place]) 05/27/2011  . Hyperlipidemia associated with type 2 diabetes mellitus ([place]) 10/19/2008  . Essential hypertension, benign 10/19/2008  . CORONARY ATHEROSCLEROSIS NATIVE CORONARY ARTERY 10/17/2008  . GERD (gastroesophageal reflux disease) 12/27/2007   Ihor Austin, [place]; CBIS [phone]  Aldona Lento 02/20/2016, 12:41 PM  [place], [place], [address] Phone: [phone]   Fax:  [phone]  Name: [name] MRN: [id number] Date of Birth: 1940/06/03

## 2016-02-24 ENCOUNTER — Ambulatory Visit (HOSPITAL_COMMUNITY): Payer: Medicare Other | Admitting: Physical Therapy

## 2016-02-24 ENCOUNTER — Encounter (HOSPITAL_COMMUNITY): Payer: Self-pay | Admitting: Physical Therapy

## 2016-02-24 DIAGNOSIS — M25512 Pain in left shoulder: Secondary | ICD-10-CM

## 2016-02-24 DIAGNOSIS — R29898 Other symptoms and signs involving the musculoskeletal system: Secondary | ICD-10-CM

## 2016-02-24 DIAGNOSIS — M25612 Stiffness of left shoulder, not elsewhere classified: Secondary | ICD-10-CM

## 2016-02-24 DIAGNOSIS — R293 Abnormal posture: Secondary | ICD-10-CM | POA: Diagnosis not present

## 2016-02-24 NOTE — Therapy (Signed)
[place], [place], [address] Phone: [phone]   Fax:  [phone]  Physical Therapy Treatment  Patient Details  Name: [name] MRN: [id number] Date of Birth: 11-Jun-1940 Referring Provider: Sydnee Cabal  Encounter Date: 02/24/2016      PT End of Session - 02/24/16 0901    Visit Number 22   Number of Visits 26   Date for PT Re-Evaluation 03/09/16   Authorization Type UHC Medicare (G-codes updated 20th visit)   Authorization Time Period 09/14/05 to 6/80/88; recert done on 1/10   Authorization - Visit Number 14   Authorization - Number of Visits 30   PT Start Time 0815   PT Stop Time 0856   PT Time Calculation (min) 41 min   Activity Tolerance Patient tolerated treatment well   Behavior During Therapy [facility] for tasks assessed/performed      Past Medical History:  Diagnosis Date  . Acute encephalopathy 08/2010  . Adenomatous polyp   . Anxiety disorder   . Arthritis   . Chronic headaches   . Chronic headaches   . Common migraine 12/23/2014  . Coronary atherosclerosis of native coronary artery    BMS nondominant RCA 12/2004  . Diverticulosis   . Essential hypertension, benign   . Gallbladder cholesterolosis   . GERD (gastroesophageal reflux disease)   . Hyperlipidemia   . Internal hemorrhoids   . Myocardial infarction    2006/1 stent  . Nephrolithiasis   . Shoulder pain, right   . Type 2 diabetes mellitus ([place])     Past Surgical History:  Procedure Laterality Date  . CATARACT EXTRACTION    . CHOLECYSTECTOMY    . COLONOSCOPY  09/10/2008   normal  . CYSTOSCOPY W/ URETEROSCOPY W/ LITHOTRIPSY  08/2010  . JOINT REPLACEMENT     right hip  . LEFT HEART CATHETERIZATION WITH CORONARY ANGIOGRAM N/A 05/14/2011   Procedure: LEFT HEART CATHETERIZATION WITH CORONARY ANGIOGRAM;  Surgeon: Sherren Mocha, MD;  Location: [facility] CATH LAB;  Service: Cardiovascular;  Laterality: N/A;  . ROTATOR CUFF REPAIR     right  . ROTATOR CUFF REPAIR Left 12/11/2015  . TOTAL KNEE ARTHROPLASTY     Bil  . UPPER GASTROINTESTINAL ENDOSCOPY  01/08/2008   bx, inlet patch, duodenitis  . YAG LASER APPLICATION Right 03/25/9456   Procedure: YAG LASER APPLICATION;  Surgeon: Williams Che, MD;  Location: [facility];  Service: Ophthalmology;  Laterality: Right;    There were no vitals filed for this visit.      Subjective Assessment - 02/24/16 0817    Subjective Pt states that he feels "pretty good. Just a little sore in my shoulders, even my R one."   Pertinent History HTN, DM, GERD, anxiety, MI, cardiac cath, R hip replacement, R RCR repair    Patient Stated Goals reduce pain, get L shoulder working well again    Currently in Pain? Yes   Pain Score 2    Pain Location Shoulder   Pain Orientation Left   Pain Descriptors / Indicators Sore   Pain Onset More than a month ago                         [facility] Adult PT Treatment/Exercise - 02/24/16 0001      Shoulder Exercises: Supine   Flexion AROM;10 reps   Other Supine Exercises rhythmic stabs in all directions in 90 deg flexion, 5 reps x 10-15 sec each  Shoulder Exercises: Seated   Flexion Left;10 reps   Flexion Limitations improved quality of technique noted     Shoulder Exercises: Sidelying   External Rotation Left;10 reps  3 sets   External Rotation Limitations towel under elbow for form     Shoulder Exercises: Standing   Other Standing Exercises shoulder taps in modified plantigrade 3 reps x 5 reps  max cues for proper technique     Manual Therapy   Manual Therapy Passive ROM;Soft tissue mobilization  and AAROM in supine   Manual therapy comments flexion and ER x 15 mins total   Soft tissue mobilization anterior L biceps, upper triceps, RTC musculature and deltoid x 8 mins total                PT Education - 02/24/16 0900    Education provided Yes   Education Details proper exercise technique, will have re-evaluation  within next few weeks to track progress   Person(s) Educated Patient   Methods Explanation;Demonstration   Comprehension Verbalized understanding;Tactile cues required;Verbal cues required;Need further instruction          PT Short Term Goals - 02/17/16 0831      PT SHORT TERM GOAL #1   Title Patient to demonstrate L shoulder flexion of 120 degrees and ABD of 110 degrees in order to show improvement of area per protocol and to reduce pain    Time 3   Period Weeks   Status Achieved     PT SHORT TERM GOAL #2   Title Patient to demonstrate L shoulder IR as unlimited and L shoulder ER of 45 degrees in order to show progression per protocol and to reduce pain    Time 3   Period Weeks   Status Achieved     PT SHORT TERM GOAL #3   Title Patient to be able to maintain correct posture at least 75% of the time in order to correct muscle imbalances and reduce pain    Time 3   Period Weeks   Status Partially Met     PT SHORT TERM GOAL #4   Title Patient to be independent in correctly and consistently performing appropriate HEP, to be updated PRN    Baseline 2/6- when he is not at PT   Time 2   Period Weeks   Status Partially Met           PT Long Term Goals - 02/17/16 2094      PT LONG TERM GOAL #1   Title Patient to demonstrate L shoulder ROM for flexion, ABD, IR, and ER as being full and unlimited to reduce pain and improve functional task performance    Time 6   Period Weeks   Status Partially Met     PT LONG TERM GOAL #2   Title Patient to demonstrate functional strength in all tested groups as being 4/5 in order to improve functional task performance skills    Time 6   Period Weeks   Status Achieved     PT LONG TERM GOAL #3   Title Patient to experience pain no more than 2/10 in order to improve functional and QOL    Baseline 2/6- can get to 5-6/10 at night    Time 6   Period Weeks   Status On-going     PT LONG TERM GOAL #4   Title Patient to be independent in  all ADLs and self-care based activities in order to improve independence and functional  performance    Baseline 2/6- reports he is able to do all of this easily but some jackets can still be a little hard    Time 6   Period Weeks   Status Partially Met     PT LONG TERM GOAL #5   Title Patient to demonstrate full cervical ROM in order to improve functional task performance and to assist in maintaining pain reduction    Time 6   Period Weeks   Status Partially Met               Plan - 02/24/16 0901    Clinical Impression Statement Pt presents to therapy this date with continued c/o L shoulder soreness. Pt tolerated manual therapy and progressed ther-ex well this date. He tolerated weight bearing through [place] well, but did state the he would probably be sore from this session. Pt demonstrates difficulty following instructions, even with verbal and tactile cues. Following treatment, pt reported decreased soreness in L shoulder compared to beginning of session.   Rehab Potential Good   Clinical Impairments Affecting Rehab Potential extensive surgical procedure    PT Frequency 2x / week   PT Duration 3 weeks   PT Treatment/Interventions ADLs/Self Care Home Management;Biofeedback;Cryotherapy;Moist Heat;Functional mobility training;Therapeutic activities;Therapeutic exercise;Balance training;Neuromuscular re-education;Patient/family education;Manual techniques;Passive range of motion;Energy conservation;Dry needling;Taping   PT Next Visit Plan Pt at 10 week post-op.  Add rotator cuff PRE strengthening next sessoin.  Continue focus on ER/ABD ROM, scapular strengthen/stability (trial prone) and closed chain position.  Progress per MD protocol.        Patient will benefit from skilled therapeutic intervention in order to improve the following deficits and impairments:  Decreased skin integrity, Improper body mechanics, Pain, Decreased mobility, Postural dysfunction, Decreased range of motion,  Decreased strength, Hypomobility, Impaired flexibility  Visit Diagnosis: Acute pain of left shoulder  Stiffness of left shoulder, not elsewhere classified  Other symptoms and signs involving the musculoskeletal system  Abnormal posture     Problem List Patient Active Problem List   Diagnosis Date Noted  . Common migraine 12/23/2014  . Hx of adenomatous colonic polyps 05/09/2014  . Obesity (BMI 30-39.9) 07/30/2013  . Type 2 diabetes mellitus with circulatory disorder ([place]) 05/27/2011  . Hyperlipidemia associated with type 2 diabetes mellitus ([place]) 10/19/2008  . Essential hypertension, benign 10/19/2008  . CORONARY ATHEROSCLEROSIS NATIVE CORONARY ARTERY 10/17/2008  . GERD (gastroesophageal reflux disease) 12/27/2007    Geraldine Solar PT, DPT   [place] [address] [place], [place], [address] Phone: [phone]   Fax:  [phone]  Name: [name] MRN: [id number] Date of Birth: September 14, 1940

## 2016-02-26 ENCOUNTER — Ambulatory Visit (HOSPITAL_COMMUNITY): Payer: Medicare Other | Admitting: Physical Therapy

## 2016-02-27 ENCOUNTER — Ambulatory Visit (HOSPITAL_COMMUNITY): Payer: Medicare Other

## 2016-02-27 DIAGNOSIS — M25512 Pain in left shoulder: Secondary | ICD-10-CM | POA: Diagnosis not present

## 2016-02-27 DIAGNOSIS — R293 Abnormal posture: Secondary | ICD-10-CM | POA: Diagnosis not present

## 2016-02-27 DIAGNOSIS — M25612 Stiffness of left shoulder, not elsewhere classified: Secondary | ICD-10-CM

## 2016-02-27 DIAGNOSIS — R29898 Other symptoms and signs involving the musculoskeletal system: Secondary | ICD-10-CM | POA: Diagnosis not present

## 2016-02-27 NOTE — Therapy (Signed)
[place], [place], [address] Phone: [phone]   Fax:  [phone]  Physical Therapy Treatment  Patient Details  Name: [name] MRN: [id number] Date of Birth: [birth year] Referring Provider: Sydnee Cabal, MD  Encounter Date: 02/27/2016      PT End of Session - 02/27/16 1433    Visit Number 23   Number of Visits 26   Date for PT Re-Evaluation 03/09/16   Authorization Type U[employer] Medicare (G-codes updated 20th visit)   Authorization Time Period 68/3/72 to 09/12/09; recert done on 1/55   Authorization - Visit Number 23   Authorization - Number of Visits 30   PT Start Time 2080   PT Stop Time 2233   PT Time Calculation (min) 48 min   Activity Tolerance Patient tolerated treatment well;No increased pain   Behavior During Therapy WFL for tasks assessed/performed      Past Medical History:  Diagnosis Date  . Acute encephalopathy 08/2010  . Adenomatous polyp   . Anxiety disorder   . Arthritis   . Chronic headaches   . Chronic headaches   . Common migraine 12/23/2014  . Coronary atherosclerosis of native coronary artery    BMS nondominant RCA 12/2004  . Diverticulosis   . Essential hypertension, benign   . Gallbladder cholesterolosis   . GERD (gastroesophageal reflux disease)   . Hyperlipidemia   . Internal hemorrhoids   . Myocardial infarction    2006/1 stent  . Nephrolithiasis   . Shoulder pain, right   . Type 2 diabetes mellitus ([place])     Past Surgical History:  Procedure Laterality Date  . CATARACT EXTRACTION    . CHOLECYSTECTOMY    . COLONOSCOPY  09/10/2008   normal  . CYSTOSCOPY W/ URETEROSCOPY W/ LITHOTRIPSY  08/2010  . JOINT REPLACEMENT     right hip  . LEFT HEART CATHETERIZATION WITH CORONARY ANGIOGRAM N/A 05/14/2011   Procedure: LEFT HEART CATHETERIZATION WITH CORONARY ANGIOGRAM;  Surgeon: Sherren Mocha, MD;  Location: [facility] CATH LAB;  Service: Cardiovascular;  Laterality: N/A;  .  ROTATOR CUFF REPAIR     right  . ROTATOR CUFF REPAIR Left 12/11/2015  . TOTAL KNEE ARTHROPLASTY     Bil  . UPPER GASTROINTESTINAL ENDOSCOPY  01/08/2008   bx, inlet patch, duodenitis  . YAG LASER APPLICATION Right 06/23/2447   Procedure: YAG LASER APPLICATION;  Surgeon: Williams Che, MD;  Location: [facility];  Service: Ophthalmology;  Laterality: Right;    There were no vitals filed for this visit.      Subjective Assessment - 02/27/16 1353    Subjective Pt stated shoulders making improvements with ability to sleep and pain reduced.  Current pain scale 1/10 Lt shoulder.   Pertinent History HTN, DM, GERD, anxiety, MI, cardiac cath, R hip replacement, R RCR repair    Patient Stated Goals reduce pain, get L shoulder working well again    Currently in Pain? Yes   Pain Score 1    Pain Location Shoulder   Pain Orientation Left   Pain Descriptors / Indicators Burning;Sore;Aching   Pain Type Surgical pain   Pain Onset More than a month ago   Pain Frequency Intermittent   Aggravating Factors  movement/ exercises with UE   Pain Relieving Factors sitting still   Effect of Pain on Daily Activities significant limitation            [facility] PT Assessment - 02/27/16 0001      Assessment  Medical Diagnosis Lt shoulder repair   Referring Provider Sydnee Cabal, MD   Onset Date/Surgical Date 12/11/15   Next MD Visit Dr. Theda Sers after PT is over    Prior Therapy none                      [facility] Adult PT Treatment/Exercise - 02/27/16 0001      Shoulder Exercises: Supine   Protraction AAROM;Both;15 reps   Protraction Limitations 2# wand   External Rotation AAROM;10 reps   Internal Rotation AAROM;10 reps   Flexion Limitations full ROM    Other Supine Exercises rhythmic stabs in all directions in 90 deg flexion, 5 reps x 10-15 sec each     Shoulder Exercises: Sidelying   External Rotation Left;15 reps   External Rotation Limitations towel under elbow for form    ABduction Left;15 reps;AROM     Shoulder Exercises: Standing   Flexion 15 reps   Row 10 reps;Theraband   Theraband Level (Shoulder Row) Level 2 (Red)   Other Standing Exercises shoulder taps in modified plantigrade x 10 reps     Shoulder Exercises: ROM/Strengthening   UBE (Upper Arm Bike) 3' forward; 3' backward L1   Wall Pushups 15 reps   Prot/Ret//Elev/Dep 10 each  cueing for form and technqiue     Manual Therapy   Manual Therapy Passive ROM;Soft tissue mobilization   Manual therapy comments flexion, abd and IR/ER    Soft tissue mobilization anterior L biceps, upper triceps, RTC musculature and deltoid    Myofascial Release Anterior deltoid; infraspinatus/teres minor   Passive ROM AAROM flexion with end range stretch x10 reps                  PT Short Term Goals - 02/17/16 0831      PT SHORT TERM GOAL #1   Title Patient to demonstrate L shoulder flexion of 120 degrees and ABD of 110 degrees in order to show improvement of area per protocol and to reduce pain    Time 3   Period Weeks   Status Achieved     PT SHORT TERM GOAL #2   Title Patient to demonstrate L shoulder IR as unlimited and L shoulder ER of 45 degrees in order to show progression per protocol and to reduce pain    Time 3   Period Weeks   Status Achieved     PT SHORT TERM GOAL #3   Title Patient to be able to maintain correct posture at least 75% of the time in order to correct muscle imbalances and reduce pain    Time 3   Period Weeks   Status Partially Met     PT SHORT TERM GOAL #4   Title Patient to be independent in correctly and consistently performing appropriate HEP, to be updated PRN    Baseline 2/6- when he is not at PT   Time 2   Period Weeks   Status Partially Met           PT Long Term Goals - 02/17/16 7014      PT LONG TERM GOAL #1   Title Patient to demonstrate L shoulder ROM for flexion, ABD, IR, and ER as being full and unlimited to reduce pain and improve functional  task performance    Time 6   Period Weeks   Status Partially Met     PT LONG TERM GOAL #2   Title Patient to demonstrate functional strength in all  tested groups as being 4/5 in order to improve functional task performance skills    Time 6   Period Weeks   Status Achieved     PT LONG TERM GOAL #3   Title Patient to experience pain no more than 2/10 in order to improve functional and QOL    Baseline 2/6- can get to 5-6/10 at night    Time 6   Period Weeks   Status On-going     PT LONG TERM GOAL #4   Title Patient to be independent in all ADLs and self-care based activities in order to improve independence and functional performance    Baseline 2/6- reports he is able to do all of this easily but some jackets can still be a little hard    Time 6   Period Weeks   Status Partially Met     PT LONG TERM GOAL #5   Title Patient to demonstrate full cervical ROM in order to improve functional task performance and to assist in maintaining pain reduction    Time 6   Period Weeks   Status Partially Met               Plan - 02/27/16 1438    Clinical Impression Statement Pt at 11-week post op.  Session focus on improving AROM and improving scapular stability.  Pt at WNL range for flexion and abduction, continues to be limited by IR/ER.  Session focus on improving range and scapular strengthening.  Manual technqiues complete to reduce facial restrictions anterior UE for pain control and to improve range.  Min cueing to improve stability with scapular strengthening exercises.  Added theraband resistance to improve scapular strengthening.  No reports of increased pain through session.     Rehab Potential Good   Clinical Impairments Affecting Rehab Potential extensive surgical procedure    PT Frequency 2x / week   PT Duration 3 weeks   PT Treatment/Interventions ADLs/Self Care Home Management;Biofeedback;Cryotherapy;Moist Heat;Functional mobility training;Therapeutic  activities;Therapeutic exercise;Balance training;Neuromuscular re-education;Patient/family education;Manual techniques;Passive range of motion;Energy conservation;Dry needling;Taping   PT Next Visit Plan Pt at 11 week post-op.  Add rotator cuff PRE strengthening next sessoin.  Continue focus on ER/ABD ROM, scapular strengthen/stability (trial prone) and closed chain position.  Progress per MD protocol.        Patient will benefit from skilled therapeutic intervention in order to improve the following deficits and impairments:  Decreased skin integrity, Improper body mechanics, Pain, Decreased mobility, Postural dysfunction, Decreased range of motion, Decreased strength, Hypomobility, Impaired flexibility  Visit Diagnosis: Acute pain of left shoulder  Stiffness of left shoulder, not elsewhere classified  Other symptoms and signs involving the musculoskeletal system  Abnormal posture     Problem List Patient Active Problem List   Diagnosis Date Noted  . Common migraine 12/23/2014  . Hx of adenomatous colonic polyps 05/09/2014  . Obesity (BMI 30-39.9) 07/30/2013  . Type 2 diabetes mellitus with circulatory disorder ([place]) 05/27/2011  . Hyperlipidemia associated with type 2 diabetes mellitus ([place]) 10/19/2008  . Essential hypertension, benign 10/19/2008  . CORONARY ATHEROSCLEROSIS NATIVE CORONARY ARTERY 10/17/2008  . GERD (gastroesophageal reflux disease) 12/27/2007   Ihor Austin, [place]; [place]  Aldona Lento 02/27/2016, 2:44 PM  [place], [place], [address] Phone: [phone]   Fax:  [phone]  Name: [name] MRN: [id number] Date of Birth: 1940/05/30

## 2016-03-02 ENCOUNTER — Encounter (HOSPITAL_COMMUNITY): Payer: Self-pay | Admitting: Physical Therapy

## 2016-03-02 ENCOUNTER — Ambulatory Visit (HOSPITAL_COMMUNITY): Payer: Medicare Other | Admitting: Physical Therapy

## 2016-03-02 DIAGNOSIS — R29898 Other symptoms and signs involving the musculoskeletal system: Secondary | ICD-10-CM

## 2016-03-02 DIAGNOSIS — R293 Abnormal posture: Secondary | ICD-10-CM | POA: Diagnosis not present

## 2016-03-02 DIAGNOSIS — M25512 Pain in left shoulder: Secondary | ICD-10-CM

## 2016-03-02 DIAGNOSIS — M25612 Stiffness of left shoulder, not elsewhere classified: Secondary | ICD-10-CM | POA: Diagnosis not present

## 2016-03-02 NOTE — Therapy (Signed)
[place], [place], [address] Phone: [phone]   Fax:  [phone]  Physical Therapy Treatment  Patient Details  Name: [name] MRN: [id number] Date of Birth: 06-25-1940 Referring Provider: Sydnee Cabal, MD  Encounter Date: 03/02/2016      PT End of Session - 03/02/16 0820    Visit Number 24   Number of Visits 26   Date for PT Re-Evaluation 03/09/16   Authorization Type [employer] Medicare (G-codes updated 20th visit)   Authorization Time Period 79/4/80 to 1/65/53; recert done on 7/48   Authorization - Visit Number 24   Authorization - Number of Visits 30   PT Start Time 0815   PT Stop Time 0900   PT Time Calculation (min) 45 min   Activity Tolerance Patient tolerated treatment well;No increased pain   Behavior During Therapy WFL for tasks assessed/performed      Past Medical History:  Diagnosis Date  . Acute encephalopathy 08/2010  . Adenomatous polyp   . Anxiety disorder   . Arthritis   . Chronic headaches   . Chronic headaches   . Common migraine 12/23/2014  . Coronary atherosclerosis of native coronary artery    BMS nondominant RCA 12/2004  . Diverticulosis   . Essential hypertension, benign   . Gallbladder cholesterolosis   . GERD (gastroesophageal reflux disease)   . Hyperlipidemia   . Internal hemorrhoids   . Myocardial infarction    2006/1 stent  . Nephrolithiasis   . Shoulder pain, right   . Type 2 diabetes mellitus ([name])     Past Surgical History:  Procedure Laterality Date  . CATARACT EXTRACTION    . CHOLECYSTECTOMY    . COLONOSCOPY  09/10/2008   normal  . CYSTOSCOPY W/ URETEROSCOPY W/ LITHOTRIPSY  08/2010  . JOINT REPLACEMENT     right hip  . LEFT HEART CATHETERIZATION WITH CORONARY ANGIOGRAM N/A 05/14/2011   Procedure: LEFT HEART CATHETERIZATION WITH CORONARY ANGIOGRAM;  Surgeon: Sherren Mocha, MD;  Location: [facility] CATH LAB;  Service: Cardiovascular;  Laterality: N/A;  .  ROTATOR CUFF REPAIR     right  . ROTATOR CUFF REPAIR Left 12/11/2015  . TOTAL KNEE ARTHROPLASTY     Bil  . UPPER GASTROINTESTINAL ENDOSCOPY  01/08/2008   bx, inlet patch, duodenitis  . YAG LASER APPLICATION Right 2/70/7867   Procedure: YAG LASER APPLICATION;  Surgeon: Williams Che, MD;  Location: [facility];  Service: Ophthalmology;  Laterality: Right;    There were no vitals filed for this visit.      Subjective Assessment - 03/02/16 0818    Subjective Pt states he feels pretty good this morning. He reports that he felt fine following last treatment session and that his shoulder is still bugging him at night when he tries to sleep.   Pertinent History HTN, DM, GERD, anxiety, MI, cardiac cath, R hip replacement, R RCR repair    Patient Stated Goals reduce pain, get L shoulder working well again    Currently in Pain? Yes   Pain Score 1    Pain Location Shoulder   Pain Orientation Left   Pain Descriptors / Indicators Sore   Pain Onset More than a month ago                         [facility] Adult PT Treatment/Exercise - 03/02/16 0001      Shoulder Exercises: Supine   Protraction Strengthening;Both;10 reps;Weights  2  sets   Protraction Limitations 3# weight bar   Other Supine Exercises rhythmic stabs in all directions in 90 deg flexion with 2# dumbbell, 5 reps x 10-15 sec each   Other Supine Exercises D2 flexion PNF (x 5 AAROM) x 5 reps, x 8 reps     Shoulder Exercises: Seated   External Rotation Strengthening;Both;10 reps  with scap retraction, 2 sets, RTB     Shoulder Exercises: Sidelying   External Rotation Left  2 sets x 8 reps   External Rotation Weight (lbs) 2#   External Rotation Limitations towel under elbow for form     Shoulder Exercises: ROM/Strengthening   UBE (Upper Arm Bike) 1.5' forward; 1.5' backward L2     Manual Therapy   Manual Therapy Joint mobilization;Passive ROM   Joint Mobilization inferior in 90 deg abd, 12 reps of 15-20 sec bouts    Passive ROM ER (in 90 deg abd); abd PROM                PT Education - 03/02/16 0858    Education provided Yes   Education Details proper exercise technique, will have reassessment next week to determine further need for PT services   Person(s) Educated Patient   Methods Explanation;Demonstration;Tactile cues;Verbal cues   Comprehension Verbalized understanding;Returned demonstration;Verbal cues required;Tactile cues required;Need further instruction          PT Short Term Goals - 02/17/16 0831      PT SHORT TERM GOAL #1   Title Patient to demonstrate L shoulder flexion of 120 degrees and ABD of 110 degrees in order to show improvement of area per protocol and to reduce pain    Time 3   Period Weeks   Status Achieved     PT SHORT TERM GOAL #2   Title Patient to demonstrate L shoulder IR as unlimited and L shoulder ER of 45 degrees in order to show progression per protocol and to reduce pain    Time 3   Period Weeks   Status Achieved     PT SHORT TERM GOAL #3   Title Patient to be able to maintain correct posture at least 75% of the time in order to correct muscle imbalances and reduce pain    Time 3   Period Weeks   Status Partially Met     PT SHORT TERM GOAL #4   Title Patient to be independent in correctly and consistently performing appropriate HEP, to be updated PRN    Baseline 2/6- when he is not at PT   Time 2   Period Weeks   Status Partially Met           PT Long Term Goals - 02/17/16 6384      PT LONG TERM GOAL #1   Title Patient to demonstrate L shoulder ROM for flexion, ABD, IR, and ER as being full and unlimited to reduce pain and improve functional task performance    Time 6   Period Weeks   Status Partially Met     PT LONG TERM GOAL #2   Title Patient to demonstrate functional strength in all tested groups as being 4/5 in order to improve functional task performance skills    Time 6   Period Weeks   Status Achieved     PT LONG TERM  GOAL #3   Title Patient to experience pain no more than 2/10 in order to improve functional and QOL    Baseline 2/6- can get to 5-6/10  at night    Time 6   Period Weeks   Status On-going     PT LONG TERM GOAL #4   Title Patient to be independent in all ADLs and self-care based activities in order to improve independence and functional performance    Baseline 2/6- reports he is able to do all of this easily but some jackets can still be a little hard    Time 6   Period Weeks   Status Partially Met     PT LONG TERM GOAL #5   Title Patient to demonstrate full cervical ROM in order to improve functional task performance and to assist in maintaining pain reduction    Time 6   Period Weeks   Status Partially Met               Plan - 03/02/16 0859    Clinical Impression Statement Pt continuing to make progress towards goals, though he is still lacking in strength and ER and abd ROM. Pt provides conflicting responses t/o treatment as he frequently grimaces during strengthening and/or ROM activities but verbalizes minimal to no pain. Pt reported some neck pain during a strengthening exercise, however, he could not give a good descriptor of the pain or relate it to that particular activity. Pt will be reassessed next week to determine need for further PT intervention.   Rehab Potential Good   Clinical Impairments Affecting Rehab Potential extensive surgical procedure    PT Frequency 2x / week   PT Duration 3 weeks   PT Treatment/Interventions ADLs/Self Care Home Management;Biofeedback;Cryotherapy;Moist Heat;Functional mobility training;Therapeutic activities;Therapeutic exercise;Balance training;Neuromuscular re-education;Patient/family education;Manual techniques;Passive range of motion;Energy conservation;Dry needling;Taping   PT Next Visit Plan Pt at 11 week post-op.  Add rotator cuff PRE strengthening next sessoin.  Continue focus on ER/ABD ROM, scapular strengthen/stability (trial  prone) and closed chain position.  Progress per MD protocol.        Patient will benefit from skilled therapeutic intervention in order to improve the following deficits and impairments:  Decreased skin integrity, Improper body mechanics, Pain, Decreased mobility, Postural dysfunction, Decreased range of motion, Decreased strength, Hypomobility, Impaired flexibility  Visit Diagnosis: Acute pain of left shoulder  Stiffness of left shoulder, not elsewhere classified  Other symptoms and signs involving the musculoskeletal system  Abnormal posture     Problem List Patient Active Problem List   Diagnosis Date Noted  . Common migraine 12/23/2014  . Hx of adenomatous colonic polyps 05/09/2014  . Obesity (BMI 30-39.9) 07/30/2013  . Type 2 diabetes mellitus with circulatory disorder ([place]) 05/27/2011  . Hyperlipidemia associated with type 2 diabetes mellitus ([place]) 10/19/2008  . Essential hypertension, benign 10/19/2008  . CORONARY ATHEROSCLEROSIS NATIVE CORONARY ARTERY 10/17/2008  . GERD (gastroesophageal reflux disease) 12/27/2007     Geraldine Solar PT, DPT   [facility] [address] [place], [place], [address] Phone: [phone]   Fax:  [phone]  Name: BRENNDEN MASTEN MRN: [id number] Date of Birth: 01-01-1941

## 2016-03-03 ENCOUNTER — Ambulatory Visit (HOSPITAL_COMMUNITY): Payer: Medicare Other | Admitting: Physical Therapy

## 2016-03-03 DIAGNOSIS — R293 Abnormal posture: Secondary | ICD-10-CM

## 2016-03-03 DIAGNOSIS — M25612 Stiffness of left shoulder, not elsewhere classified: Secondary | ICD-10-CM

## 2016-03-03 DIAGNOSIS — M25512 Pain in left shoulder: Secondary | ICD-10-CM

## 2016-03-03 DIAGNOSIS — R29898 Other symptoms and signs involving the musculoskeletal system: Secondary | ICD-10-CM

## 2016-03-03 NOTE — Therapy (Signed)
[place], [place], [address] Phone: [phone]   Fax:  [phone]  Physical Therapy Treatment  Patient Details  Name: [name] MRN: [id number] Date of Birth: [birth year] Referring Provider: Sydnee Cabal, MD  Encounter Date: 03/03/2016      PT End of Session - 03/03/16 1216    Visit Number 25   Number of Visits 26   Date for PT Re-Evaluation 03/09/16   Authorization Type [employer] Medicare (G-codes updated 20th visit)   Authorization Time Period recert 6/9-4/85   Authorization - Visit Number 25   Authorization - Number of Visits 30   PT Start Time 1120   PT Stop Time 1200   PT Time Calculation (min) 40 min   Activity Tolerance Patient tolerated treatment well;No increased pain   Behavior During Therapy WFL for tasks assessed/performed      Past Medical History:  Diagnosis Date  . Acute encephalopathy 08/2010  . Adenomatous polyp   . Anxiety disorder   . Arthritis   . Chronic headaches   . Chronic headaches   . Common migraine 12/23/2014  . Coronary atherosclerosis of native coronary artery    BMS nondominant RCA 12/2004  . Diverticulosis   . Essential hypertension, benign   . Gallbladder cholesterolosis   . GERD (gastroesophageal reflux disease)   . Hyperlipidemia   . Internal hemorrhoids   . Myocardial infarction    2006/1 stent  . Nephrolithiasis   . Shoulder pain, right   . Type 2 diabetes mellitus ([place])     Past Surgical History:  Procedure Laterality Date  . CATARACT EXTRACTION    . CHOLECYSTECTOMY    . COLONOSCOPY  09/10/2008   normal  . CYSTOSCOPY W/ URETEROSCOPY W/ LITHOTRIPSY  08/2010  . JOINT REPLACEMENT     right hip  . LEFT HEART CATHETERIZATION WITH CORONARY ANGIOGRAM N/A 05/14/2011   Procedure: LEFT HEART CATHETERIZATION WITH CORONARY ANGIOGRAM;  Surgeon: Sherren Mocha, MD;  Location: [facility] CATH LAB;  Service: Cardiovascular;  Laterality: N/A;  . ROTATOR CUFF REPAIR     right  . ROTATOR CUFF REPAIR Left 12/11/2015  . TOTAL KNEE ARTHROPLASTY     Bil  . UPPER GASTROINTESTINAL ENDOSCOPY  01/08/2008   bx, inlet patch, duodenitis  . YAG LASER APPLICATION Right 4/62/7035   Procedure: YAG LASER APPLICATION;  Surgeon: Williams Che, MD;  Location: [facility];  Service: Ophthalmology;  Laterality: Right;    There were no vitals filed for this visit.      Subjective Assessment - 03/03/16 1232    Subjective Pt states he is doing overall well and continues to complete his exercises.  States his shoulder is sore but not really any pain.   Currently in Pain? No/denies                         [facility] Adult PT Treatment/Exercise - 03/03/16 0001      Shoulder Exercises: Standing   Horizontal ABduction Left;10 reps;Theraband   Theraband Level (Shoulder Horizontal ABduction) Level 3 (Green)   External Rotation Left;10 reps;Theraband   Theraband Level (Shoulder External Rotation) Level 3 (Green)   Internal Rotation Left;10 reps   Theraband Level (Shoulder Internal Rotation) Level 3 (Green)   Extension Both;10 reps;Theraband   Theraband Level (Shoulder Extension) Level 3 (Green)   Row 10 reps;Theraband   Theraband Level (Shoulder Row) Level 3 (Green)   Retraction 10 reps;Theraband   Theraband Level (Shoulder  Retraction) Level 3 (Green)     Shoulder Exercises: ROM/Strengthening   UBE (Upper Arm Bike) 2' forward; 2' backward L2   Wall Wash 30 seconds each:  up/down, Lt/Rt, CW, CCW   Wall Pushups 15 reps   Other ROM/Strengthening Exercises wall arch 10 reps    Other ROM/Strengthening Exercises UE flexion against wall 10 reps     Manual Therapy   Manual Therapy Joint mobilization;Passive ROM;Soft tissue mobilization   Joint Mobilization inferior in 90 deg abd, 12 reps of 15-20 sec bouts   Soft tissue mobilization anterior L biceps, upper triceps, RTC musculature and deltoid    Passive ROM ER (in 90 deg abd); abd PROM                PT  Education - 03/02/16 0858    Education provided Yes   Education Details proper exercise technique, will have reassessment next week to determine further need for PT services   Person(s) Educated Patient   Methods Explanation;Demonstration;Tactile cues;Verbal cues   Comprehension Verbalized understanding;Returned demonstration;Verbal cues required;Tactile cues required;Need further instruction          PT Short Term Goals - 02/17/16 0831      PT SHORT TERM GOAL #1   Title Patient to demonstrate L shoulder flexion of 120 degrees and ABD of 110 degrees in order to show improvement of area per protocol and to reduce pain    Time 3   Period Weeks   Status Achieved     PT SHORT TERM GOAL #2   Title Patient to demonstrate L shoulder IR as unlimited and L shoulder ER of 45 degrees in order to show progression per protocol and to reduce pain    Time 3   Period Weeks   Status Achieved     PT SHORT TERM GOAL #3   Title Patient to be able to maintain correct posture at least 75% of the time in order to correct muscle imbalances and reduce pain    Time 3   Period Weeks   Status Partially Met     PT SHORT TERM GOAL #4   Title Patient to be independent in correctly and consistently performing appropriate HEP, to be updated PRN    Baseline 2/6- when he is not at PT   Time 2   Period Weeks   Status Partially Met           PT Long Term Goals - 02/17/16 5102      PT LONG TERM GOAL #1   Title Patient to demonstrate L shoulder ROM for flexion, ABD, IR, and ER as being full and unlimited to reduce pain and improve functional task performance    Time 6   Period Weeks   Status Partially Met     PT LONG TERM GOAL #2   Title Patient to demonstrate functional strength in all tested groups as being 4/5 in order to improve functional task performance skills    Time 6   Period Weeks   Status Achieved     PT LONG TERM GOAL #3   Title Patient to experience pain no more than 2/10 in order to  improve functional and QOL    Baseline 2/6- can get to 5-6/10 at night    Time 6   Period Weeks   Status On-going     PT LONG TERM GOAL #4   Title Patient to be independent in all ADLs and self-care based activities in order to improve independence  and functional performance    Baseline 2/6- reports he is able to do all of this easily but some jackets can still be a little hard    Time 6   Period Weeks   Status Partially Met     PT LONG TERM GOAL #5   Title Patient to demonstrate full cervical ROM in order to improve functional task performance and to assist in maintaining pain reduction    Time 6   Period Weeks   Status Partially Met               Plan - 03/03/16 1218    Clinical Impression Statement Pt with continued progression towards goals with only limitation voiced is inability to get hand behind his back (i.e as to tuck in his shirt).   All other motions coming along well.   Progressed with theraband strengthening and muscular endurance of shoulder musculature.  Pt required tactile cues to complete all theraband exercises due to poor core stability and difficulty isolating correct musculature.  Noted fatigue following 30 seconds of activity Continued with manual to improve scapular and general shoulder mobility.    Rehab Potential Good   Clinical Impairments Affecting Rehab Potential extensive surgical procedure    PT Frequency 2x / week   PT Duration 3 weeks   PT Treatment/Interventions ADLs/Self Care Home Management;Biofeedback;Cryotherapy;Moist Heat;Functional mobility training;Therapeutic activities;Therapeutic exercise;Balance training;Neuromuscular re-education;Patient/family education;Manual techniques;Passive range of motion;Energy conservation;Dry needling;Taping   PT Next Visit Plan re-evaluate next session.   PT Home Exercise Plan shoulder isometrics all directions 5x10 sec each, flexion AAROM in supine; 01/23/16 Added  Shoulder IR towel stretch       Patient  will benefit from skilled therapeutic intervention in order to improve the following deficits and impairments:  Decreased skin integrity, Improper body mechanics, Pain, Decreased mobility, Postural dysfunction, Decreased range of motion, Decreased strength, Hypomobility, Impaired flexibility  Visit Diagnosis: Acute pain of left shoulder  Stiffness of left shoulder, not elsewhere classified  Other symptoms and signs involving the musculoskeletal system  Abnormal posture     Problem List Patient Active Problem List   Diagnosis Date Noted  . Common migraine 12/23/2014  . Hx of adenomatous colonic polyps 05/09/2014  . Obesity (BMI 30-39.9) 07/30/2013  . Type 2 diabetes mellitus with circulatory disorder ([place]) 05/27/2011  . Hyperlipidemia associated with type 2 diabetes mellitus ([place]) 10/19/2008  . Essential hypertension, benign 10/19/2008  . CORONARY ATHEROSCLEROSIS NATIVE CORONARY ARTERY 10/17/2008  . GERD (gastroesophageal reflux disease) 12/27/2007    Teena Irani, PTA/CLT [phone]  03/03/2016, 12:34 PM  [place] [address] [place], [place], [address] Phone: [phone]   Fax:  [phone]  Name: [name] MRN: [id number] Date of Birth: March 23, 1940

## 2016-03-04 ENCOUNTER — Ambulatory Visit (HOSPITAL_COMMUNITY): Payer: Medicare Other

## 2016-03-09 ENCOUNTER — Ambulatory Visit (HOSPITAL_COMMUNITY): Payer: Medicare Other

## 2016-03-09 DIAGNOSIS — R29898 Other symptoms and signs involving the musculoskeletal system: Secondary | ICD-10-CM

## 2016-03-09 DIAGNOSIS — R293 Abnormal posture: Secondary | ICD-10-CM

## 2016-03-09 DIAGNOSIS — M25612 Stiffness of left shoulder, not elsewhere classified: Secondary | ICD-10-CM

## 2016-03-09 DIAGNOSIS — M25512 Pain in left shoulder: Secondary | ICD-10-CM | POA: Diagnosis not present

## 2016-03-09 NOTE — Therapy (Signed)
[place], [place], [address] Phone: [phone]   Fax:  [phone]  Physical Therapy Treatment  Patient Details  Name: [name] MRN: [id number] Date of Birth: [birth year] Referring Provider: Sydnee Cabal, MD  Encounter Date: 03/09/2016      PT End of Session - 03/09/16 0823    Visit Number 26   Number of Visits 26   Date for PT Re-Evaluation 03/09/16   Authorization Type U[employer] Medicare (G-codes updated 20th visit)   Authorization Time Period recert 5/6-3/87   Authorization - Visit Number 26   Authorization - Number of Visits 30   PT Start Time 0818   PT Stop Time 0902   PT Time Calculation (min) 44 min   Activity Tolerance Patient tolerated treatment well;No increased pain   Behavior During Therapy WFL for tasks assessed/performed      Past Medical History:  Diagnosis Date  . Acute encephalopathy 08/2010  . Adenomatous polyp   . Anxiety disorder   . Arthritis   . Chronic headaches   . Chronic headaches   . Common migraine 12/23/2014  . Coronary atherosclerosis of native coronary artery    BMS nondominant RCA 12/2004  . Diverticulosis   . Essential hypertension, benign   . Gallbladder cholesterolosis   . GERD (gastroesophageal reflux disease)   . Hyperlipidemia   . Internal hemorrhoids   . Myocardial infarction    2006/1 stent  . Nephrolithiasis   . Shoulder pain, right   . Type 2 diabetes mellitus ([place])     Past Surgical History:  Procedure Laterality Date  . CATARACT EXTRACTION    . CHOLECYSTECTOMY    . COLONOSCOPY  09/10/2008   normal  . CYSTOSCOPY W/ URETEROSCOPY W/ LITHOTRIPSY  08/2010  . JOINT REPLACEMENT     right hip  . LEFT HEART CATHETERIZATION WITH CORONARY ANGIOGRAM N/A 05/14/2011   Procedure: LEFT HEART CATHETERIZATION WITH CORONARY ANGIOGRAM;  Surgeon: Sherren Mocha, MD;  Location: [facility] CATH LAB;  Service: Cardiovascular;  Laterality: N/A;  . ROTATOR CUFF REPAIR     right  . ROTATOR CUFF REPAIR Left 12/11/2015  . TOTAL KNEE ARTHROPLASTY     Bil  . UPPER GASTROINTESTINAL ENDOSCOPY  01/08/2008   bx, inlet patch, duodenitis  . YAG LASER APPLICATION Right 5/64/3329   Procedure: YAG LASER APPLICATION;  Surgeon: Williams Che, MD;  Location: [facility];  Service: Ophthalmology;  Laterality: Right;    There were no vitals filed for this visit.      Subjective Assessment - 03/09/16 0820    Subjective Pt stated he continues to be limited by shoulder pain and acheyness more at night, current pain scale 3/10.   Pertinent History HTN, DM, GERD, anxiety, MI, cardiac cath, R hip replacement, R RCR repair    Patient Stated Goals reduce pain, get L shoulder working well again    Currently in Pain? Yes   Pain Score 3    Pain Location Shoulder   Pain Orientation Left   Pain Descriptors / Indicators Aching   Pain Type Surgical pain   Pain Onset More than a month ago   Pain Frequency Intermittent   Aggravating Factors  movement/exercises with UE   Pain Relieving Factors sitting still   Effect of Pain on Daily Activities significant limitation            [facility] PT Assessment - 03/09/16 0001      AROM   Left Shoulder Flexion 164 Degrees  was 161   Left Shoulder ABduction 169 Degrees  was 165   Left Shoulder Internal Rotation 89 Degrees  was 79   Left Shoulder External Rotation 50 Degrees  was 89   Cervical - Right Rotation --  was 52   Cervical - Left Rotation --  was 55                     [facility] Adult PT Treatment/Exercise - 03/09/16 0001      Shoulder Exercises: Standing   Horizontal ABduction 15 reps;Theraband   Theraband Level (Shoulder Horizontal ABduction) Level 3 (Green)   External Rotation Left;15 reps;Theraband   Theraband Level (Shoulder External Rotation) Level 3 (Green)   Internal Rotation Left;15 reps;Theraband   Theraband Level (Shoulder Internal Rotation) Level 3 (Green)   Extension Left;15 reps;Theraband    Theraband Level (Shoulder Extension) Level 3 (Green)   Row 15 reps;Theraband   Theraband Level (Shoulder Row) Level 3 (Green)     Shoulder Exercises: ROM/Strengthening   UBE (Upper Arm Bike) 2' forward; 2' backward L2   Wall Wash 30 seconds each:  up/down, Lt/Rt, CW, CCW   Wall Pushups 15 reps   Other ROM/Strengthening Exercises UE flexion against wall 10 reps     Shoulder Exercises: Stretch   Other Shoulder Stretches ER with towel at elbow for form 3x 30"     Manual Therapy   Manual Therapy Joint mobilization;Passive ROM;Soft tissue mobilization   Manual therapy comments manual complete separately from all skilled intervention   Joint Mobilization inferior in 90 deg abd, 12 reps of 15-20 sec bouts   Soft tissue mobilization anterior L biceps, upper triceps, RTC musculature and deltoid    Myofascial Release Anterior deltoid; infraspinatus/teres minor   Passive ROM ER (in 90 deg abd); abd PROM                  PT Short Term Goals - 03/09/16 0857      PT SHORT TERM GOAL #1   Title Patient to demonstrate L shoulder flexion of 120 degrees and ABD of 110 degrees in order to show improvement of area per protocol and to reduce pain    Status Achieved     PT SHORT TERM GOAL #2   Title Patient to demonstrate L shoulder IR as unlimited and L shoulder ER of 45 degrees in order to show progression per protocol and to reduce pain    Status Achieved     PT SHORT TERM GOAL #3   Title Patient to be able to maintain correct posture at least 75% of the time in order to correct muscle imbalances and reduce pain    Status Partially Met     PT SHORT TERM GOAL #4   Title Patient to be independent in correctly and consistently performing appropriate HEP, to be updated PRN    Baseline 2/27:  Reports compliance with HEP 3x a week plus exercises completing during PT Sessions   Status Partially Met           PT Long Term Goals - 03/09/16 0858      PT LONG TERM GOAL #1   Title Patient  to demonstrate L shoulder ROM for flexion, ABD, IR, and ER as being full and unlimited to reduce pain and improve functional task performance    Status Partially Met     PT LONG TERM GOAL #2   Title Patient to demonstrate functional strength in all tested groups as being 4/5  in order to improve functional task performance skills    Status Achieved     PT LONG TERM GOAL #3   Title Patient to experience pain no more than 2/10 in order to improve functional and QOL      PT LONG TERM GOAL #4   Title Patient to be independent in all ADLs and self-care based activities in order to improve independence and functional performance    Baseline 2/6- reports he is able to do all of this easily but some jackets can still be a little hard; 2/27:  Reports has improved 85% functionally     PT LONG TERM GOAL #5   Title Patient to demonstrate full cervical ROM in order to improve functional task performance and to assist in maintaining pain reduction    Status Partially Met               Plan - 03/09/16 0914    Clinical Impression Statement Continued session focus on improving AROM and scapular strengthening.  ROM measurements taken with good progress noted with all directions though does continue to be limited with ER.  Added stretches to assist with range with therapist facilitatin required to improve from and reduce compensation with wrist extension and trunk rotation.  Continued with theraband strengthening for UE and scapular strengthening with min cueing for form and technqiue.  No reports of increased pain through session.     Rehab Potential Good   Clinical Impairments Affecting Rehab Potential extensive surgical procedure    PT Frequency 2x / week   PT Duration 3 weeks   PT Treatment/Interventions ADLs/Self Care Home Management;Biofeedback;Cryotherapy;Moist Heat;Functional mobility training;Therapeutic activities;Therapeutic exercise;Balance training;Neuromuscular re-education;Patient/family  education;Manual techniques;Passive range of motion;Energy conservation;Dry needling;Taping   PT Next Visit Plan re-evaluate next session.   PT Home Exercise Plan shoulder isometrics all directions 5x10 sec each, flexion AAROM in supine; 01/23/16 Added  Shoulder IR towel stretch       Patient will benefit from skilled therapeutic intervention in order to improve the following deficits and impairments:  Decreased skin integrity, Improper body mechanics, Pain, Decreased mobility, Postural dysfunction, Decreased range of motion, Decreased strength, Hypomobility, Impaired flexibility  Visit Diagnosis: Acute pain of left shoulder  Stiffness of left shoulder, not elsewhere classified  Other symptoms and signs involving the musculoskeletal system  Abnormal posture     Problem List Patient Active Problem List   Diagnosis Date Noted  . Common migraine 12/23/2014  . Hx of adenomatous colonic polyps 05/09/2014  . Obesity (BMI 30-39.9) 07/30/2013  . Type 2 diabetes mellitus with circulatory disorder ([place]) 05/27/2011  . Hyperlipidemia associated with type 2 diabetes mellitus ([place]) 10/19/2008  . Essential hypertension, benign 10/19/2008  . CORONARY ATHEROSCLEROSIS NATIVE CORONARY ARTERY 10/17/2008  . GERD (gastroesophageal reflux disease) 12/27/2007   Ihor Austin, LPTA; CBIS [phone]  Aldona Lento 03/09/2016, 9:32 AM  [place], [place], [address] Phone: [phone]   Fax:  [phone]  Name: [name] MRN: [id number] Date of Birth: 1940-01-29

## 2016-03-11 ENCOUNTER — Ambulatory Visit (HOSPITAL_COMMUNITY): Payer: Medicare Other | Attending: Specialist | Admitting: Physical Therapy

## 2016-03-11 DIAGNOSIS — M25612 Stiffness of left shoulder, not elsewhere classified: Secondary | ICD-10-CM

## 2016-03-11 DIAGNOSIS — R29898 Other symptoms and signs involving the musculoskeletal system: Secondary | ICD-10-CM | POA: Diagnosis not present

## 2016-03-11 DIAGNOSIS — M25512 Pain in left shoulder: Secondary | ICD-10-CM | POA: Diagnosis not present

## 2016-03-11 DIAGNOSIS — R293 Abnormal posture: Secondary | ICD-10-CM

## 2016-03-11 NOTE — Therapy (Signed)
[place], [place], [address] Phone: [phone]   Fax:  [phone]  Physical Therapy Treatment (Discharge)  Patient Details  Name: [name] MRN: [id number] Date of Birth: September 10, 1940 Referring Provider: Sydnee Cabal   Encounter Date: 03/11/2016      PT End of Session - 03/11/16 0849    Visit Number 27   Number of Visits [place] [employer] (G-codes updated 27th visit)   Authorization Time Period recert 7/2-5/36; recert done 3/1   Authorization - Visit Number 27   Authorization - Number of Visits 30   PT Start Time 0815   PT Stop Time 0840  DC today, no further skilled PT services appropriate    PT Time Calculation (min) 25 min   Activity Tolerance Patient tolerated treatment well;No increased pain   Behavior During Therapy WFL for tasks assessed/performed      Past Medical History:  Diagnosis Date  . Acute encephalopathy 08/2010  . Adenomatous polyp   . Anxiety disorder   . Arthritis   . Chronic headaches   . Chronic headaches   . Common migraine 12/23/2014  . Coronary atherosclerosis of native coronary artery    BMS nondominant RCA 12/2004  . Diverticulosis   . Essential hypertension, benign   . Gallbladder cholesterolosis   . GERD (gastroesophageal reflux disease)   . Hyperlipidemia   . Internal hemorrhoids   . Myocardial infarction    2006/1 stent  . Nephrolithiasis   . Shoulder pain, right   . Type 2 diabetes mellitus ([place])     Past Surgical History:  Procedure Laterality Date  . CATARACT EXTRACTION    . CHOLECYSTECTOMY    . COLONOSCOPY  09/10/2008   normal  . CYSTOSCOPY W/ URETEROSCOPY W/ LITHOTRIPSY  08/2010  . JOINT REPLACEMENT     right hip  . LEFT HEART CATHETERIZATION WITH CORONARY ANGIOGRAM N/A 05/14/2011   Procedure: LEFT HEART CATHETERIZATION WITH CORONARY ANGIOGRAM;  Surgeon: Sherren Mocha, MD;  Location: [facility] CATH LAB;  Service: Cardiovascular;   Laterality: N/A;  . ROTATOR CUFF REPAIR     right  . ROTATOR CUFF REPAIR Left 12/11/2015  . TOTAL KNEE ARTHROPLASTY     Bil  . UPPER GASTROINTESTINAL ENDOSCOPY  01/08/2008   bx, inlet patch, duodenitis  . YAG LASER APPLICATION Right 6/44/0347   Procedure: YAG LASER APPLICATION;  Surgeon: Williams Che, MD;  Location: [facility];  Service: Ophthalmology;  Laterality: Right;    There were no vitals filed for this visit.      Subjective Assessment - 03/11/16 0817    Subjective Patient arrives stating that he feels like his should is generally doing well, his main difficulty with pain comes with sleep at night. Even laying back in his recliner can make it hurt. He can move fairly well, he can just feel his shoulder hurting a little at end range. He gives himself a "B" grade with last remaining impairment to him being the pain at night.    Pertinent History HTN, DM, GERD, anxiety, MI, cardiac cath, R hip replacement, R RCR repair    Patient Stated Goals reduce pain, get L shoulder working well again    Currently in Pain? No/denies            [facility] PT Assessment - 03/11/16 0001      Assessment   Medical Diagnosis Lt shoulder repair   Referring Provider Sydnee Cabal    Onset Date/Surgical  Date 12/11/15   Next MD Visit Dr. Theda Sers March 10th    Prior Therapy none      Balance Screen   Has the patient fallen in the past 6 months No   Has the patient had a decrease in activity level because of a fear of falling?  No   Is the patient reluctant to leave their home because of a fear of falling?  No     Prior Function   Level of Independence Independent;Independent with basic ADLs;Independent with gait;Independent with transfers   Vocation Retired     AROM   Left Shoulder Flexion 166 Degrees  supine    Left Shoulder ABduction 160 Degrees  supine    Left Shoulder Internal Rotation 87 Degrees  supine    Left Shoulder External Rotation 50 Degrees  supine    Cervical - Right  Rotation --  WFL, approx 40% limited    Cervical - Left Rotation --  WFL, approx 40% limited      Strength   Overall Strength Comments grip strength appears symmetrical   improving L    Right Shoulder Flexion 4+/5   Right Shoulder ABduction 5/5   Right Shoulder Internal Rotation 5/5   Right Shoulder External Rotation 5/5   Left Shoulder Flexion 5/5   Left Shoulder ABduction 4+/5   Left Shoulder Internal Rotation 4+/5   Left Shoulder External Rotation 4/5   Right Elbow Flexion 4+/5   Right Elbow Extension 4/5   Left Elbow Flexion 4+/5   Left Elbow Extension 4/5                             PT Education - 03/11/16 0848    Education provided Yes   Education Details progress with skilled PT services, DC today; consider water exercise at [facility] as well as resistance training when MD has cleared him to do so    Person(s) Educated Patient   Methods Explanation   Comprehension Verbalized understanding          PT Short Term Goals - 03/11/16 3832      PT SHORT TERM GOAL #1   Title Patient to demonstrate L shoulder flexion of 120 degrees and ABD of 110 degrees in order to show improvement of area per protocol and to reduce pain    Time 3   Period Weeks   Status Achieved     PT SHORT TERM GOAL #2   Title Patient to demonstrate L shoulder IR as unlimited and L shoulder ER of 45 degrees in order to show progression per protocol and to reduce pain    Time 3   Period Weeks   Status Achieved     PT SHORT TERM GOAL #3   Title Patient to be able to maintain correct posture at least 75% of the time in order to correct muscle imbalances and reduce pain    Baseline 3/1- forward head posture    Time 3   Period Weeks   Status On-going     PT SHORT TERM GOAL #4   Title Patient to be independent in correctly and consistently performing appropriate HEP, to be updated PRN    Baseline 3/1- "I do them whenever I think about it"    Time 2   Period Weeks   Status  On-going           PT Long Term Goals - 03/11/16 9191  PT LONG TERM GOAL #1   Title Patient to demonstrate L shoulder ROM for flexion, ABD, IR, and ER as being full and unlimited to reduce pain and improve functional task performance    Time 6   Period Weeks   Status Partially Met     PT LONG TERM GOAL #2   Title Patient to demonstrate functional strength in all tested groups as being 4/5 in order to improve functional task performance skills    Time 6   Period Weeks   Status Achieved     PT LONG TERM GOAL #3   Title Patient to experience pain no more than 2/10 in order to improve functional and QOL    Time 6   Period Weeks   Status Achieved     PT LONG TERM GOAL #4   Title Patient to be independent in all ADLs and self-care based activities in order to improve independence and functional performance    Time 6   Period Weeks   Status Achieved     PT LONG TERM GOAL #5   Title Patient to demonstrate full cervical ROM in order to improve functional task performance and to assist in maintaining pain reduction    Time 6   Period Weeks   Status Partially Met               Plan - 04/05/16 0850    Clinical Impression Statement Re-assessment performed today. Patient states he feels he is doing very well in terms of his shoulder and he feels that he has very few limitations at this point, he is able to do basically everything he needs and wants to do right now. Examination does not reveal significant objective change since last re-assessment, although as previously stated patient does report improved QOL. Patient is not very compliant with HEP and seems to have general difficulty in correctly performing exercises, so no additional exercises added today. Recommend DC today with return to MD for POC moving forward.    Rehab Potential Good   Clinical Impairments Affecting Rehab Potential extensive surgical procedure    PT Next Visit Plan DC today    Consulted and Agree  with Plan of Care Patient      Patient will benefit from skilled therapeutic intervention in order to improve the following deficits and impairments:  Decreased skin integrity, Improper body mechanics, Pain, Decreased mobility, Postural dysfunction, Decreased range of motion, Decreased strength, Hypomobility, Impaired flexibility  Visit Diagnosis: Acute pain of left shoulder - Plan: PT plan of care cert/re-cert  Stiffness of left shoulder, not elsewhere classified - Plan: PT plan of care cert/re-cert  Other symptoms and signs involving the musculoskeletal system - Plan: PT plan of care cert/re-cert  Abnormal posture - Plan: PT plan of care cert/re-cert       G-Codes - 04-05-16 8185    Functional Assessment Tool Used (Outpatient Only) Clinical judgement based on assessment of ROM, strength and activity tolerance.    Functional Limitation Mobility: Walking and moving around   Mobility: Walking and Moving Around Goal Status [phone]) At least 1 percent but less than 20 percent impaired, limited or restricted   Mobility: Walking and Moving Around Discharge Status [phone]) At least 1 percent but less than 20 percent impaired, limited or restricted      Problem List Patient Active Problem List   Diagnosis Date Noted  . Common migraine 12/23/2014  . Hx of adenomatous colonic polyps 05/09/2014  . Obesity (BMI 30-39.9) 07/30/2013  .  Type 2 diabetes mellitus with circulatory disorder ([place]) 05/27/2011  . Hyperlipidemia associated with type 2 diabetes mellitus ([place]) 10/19/2008  . Essential hypertension, benign 10/19/2008  . CORONARY ATHEROSCLEROSIS NATIVE CORONARY ARTERY 10/17/2008  . GERD (gastroesophageal reflux disease) 12/27/2007   PHYSICAL THERAPY DISCHARGE SUMMARY  Visits from Start of Care: 27  Current functional level related to goals / functional outcomes: Patient has progressed well but at this time appears to have maximized benefit from skilled PT services; no HEP updates given  due to difficulty with compliance with existing program. Recommend DC today with pending return to MD.   Remaining deficits: Mild shoulder ROM limitations, poor posture, mild L shoulder pain, functional weakness    Education / Equipment: progress with skilled PT services, DC today; consider water exercise at [facility] as well as resistance training when MD has cleared him to do so    Plan: Patient agrees to discharge.  Patient goals were partially met. Patient is being discharged due to being pleased with the current functional level.  ?????      Deniece Ree PT, DPT [place] [address] [place], [place], [address] Phone: [phone]   Fax:  [phone]  Name: [name] MRN: [id number] Date of Birth: 1940-10-09

## 2016-03-16 NOTE — Progress Notes (Signed)
Cardiology Office Note  Date: 03/17/2016   ID: KADON ANDRUS, DOB 1940-09-25, MRN [id number]  PCP: Wardell Honour, MD  Primary Cardiologist: Rozann Lesches, MD   Chief Complaint  Patient presents with  . Coronary Artery Disease    History of Present Illness: [name] is a 76 y.o. male last seen in August 2017. He presents for a routine follow-up visit. Reports having left rotator cuff surgery since I saw him, has just finished rehabilitation and is feeling better. He has not had any angina symptoms or unusual shortness of breath with activity.  He had a follow-up Myoview back in September 2017 that was low risk as outlined below. Cardiac catheterization in May 2013 revealed moderate nonobstructive CAD with stented portion of small nondominant RCA demonstrating diffuse stenosis in the distal site and beyond with 80% segmental disease.  I personally reviewed his ECG from today which shows normal sinus rhythm.  We went over his medications. Current cardiac regimen includes aspirin, Norvasc, Lipitor, chlorthalidone, Cozaar, and as needed nitroglycerin. He had a recent lipid panel as outlined below.  Past Medical History:  Diagnosis Date  . Acute encephalopathy 08/2010  . Adenomatous polyp   . Anxiety disorder   . Arthritis   . Chronic headaches   . Chronic headaches   . Common migraine 12/23/2014  . Coronary atherosclerosis of native coronary artery    BMS nondominant RCA 12/2004  . Diverticulosis   . Essential hypertension, benign   . Gallbladder cholesterolosis   . GERD (gastroesophageal reflux disease)   . Hyperlipidemia   . Internal hemorrhoids   . Myocardial infarction    2006/1 stent  . Nephrolithiasis   . Shoulder pain, right   . Type 2 diabetes mellitus ([place])     Past Surgical History:  Procedure Laterality Date  . CATARACT EXTRACTION    . CHOLECYSTECTOMY    . COLONOSCOPY  09/10/2008   normal  . CYSTOSCOPY W/ URETEROSCOPY W/ LITHOTRIPSY   08/2010  . JOINT REPLACEMENT     right hip  . LEFT HEART CATHETERIZATION WITH CORONARY ANGIOGRAM N/A 05/14/2011   Procedure: LEFT HEART CATHETERIZATION WITH CORONARY ANGIOGRAM;  Surgeon: Sherren Mocha, MD;  Location: [facility] CATH LAB;  Service: Cardiovascular;  Laterality: N/A;  . ROTATOR CUFF REPAIR     right  . ROTATOR CUFF REPAIR Left 12/11/2015  . TOTAL KNEE ARTHROPLASTY     Bil  . UPPER GASTROINTESTINAL ENDOSCOPY  01/08/2008   bx, inlet patch, duodenitis  . YAG LASER APPLICATION Right 9/47/6546   Procedure: YAG LASER APPLICATION;  Surgeon: Williams Che, MD;  Location: [facility] ORS;  Service: Ophthalmology;  Laterality: Right;    Current Outpatient Prescriptions  Medication Sig Dispense Refill  . acetaminophen (TYLENOL) 650 MG CR tablet Take 650 mg by mouth every 8 (eight) hours as needed for pain.    Marland Kitchen amLODipine (NORVASC) 5 MG tablet TAKE 1 TABLET BY MOUTH  DAILY 90 tablet 1  . aspirin 81 MG tablet Take 81 mg by mouth every morning.     Marland Kitchen atorvastatin (LIPITOR) 20 MG tablet TAKE 1 TABLET BY MOUTH  DAILY AT 6 PM. 90 tablet 0  . Blood Glucose Monitoring Suppl (ONE TOUCH ULTRA SYSTEM KIT) W/DEVICE KIT 1 kit by Does not apply route once. 1 each 0  . butalbital-acetaminophen-caffeine (FIORICET, ESGIC) 50-325-40 MG tablet Take 1 tablet by mouth 2 (two) times daily as needed for headache. 14 tablet 0  . cetirizine (ZYRTEC) 10 MG tablet Take 10  mg by mouth every evening.     . chlorthalidone (HYGROTON) 25 MG tablet TAKE 1 TABLET BY MOUTH  DAILY AS DIRECTED 90 tablet 1  . Cholecalciferol (VITAMIN D3) 2000 UNITS TABS Take 1 tablet by mouth every morning.     . famotidine (PEPCID) 20 MG tablet Take 1 tablet by mouth two  times daily 180 tablet 1  . HUMALOG KWIKPEN 100 UNIT/ML KiwkPen INJECT 0-15 UNITS INTO SKIN THREE TIMES DAILY BEFORE  MEALS. 70-150=8 UNITS  151-200= 9 UNITS 45 mL 2  . Insulin Pen Needle (B-D ULTRAFINE III SHORT PEN) 31G X 8 MM MISC USE 4 TIMES DAILY DX E11.49 400 each 1  . LANTUS  SOLOSTAR 100 UNIT/ML Solostar Pen INJECT SUBCUTANEOUSLY 52  UNITS AT BEDTIME 60 mL 0  . losartan (COZAAR) 50 MG tablet Take 1 tablet (50 mg total) by mouth daily. 90 tablet 3  . metFORMIN (GLUCOPHAGE-XR) 500 MG 24 hr tablet Take 2 tablets by mouth two times daily 360 tablet 1  . nitroGLYCERIN (NITROSTAT) 0.4 MG SL tablet USE AS DIRECTED AS NEEDED FOR CHEST PAIN 25 tablet 3  . ONE TOUCH ULTRA TEST test strip 1 each by Other route 4 (four) times daily. 400 each 1  . PARoxetine (PAXIL) 20 MG tablet TAKE 1 TABLET BY MOUTH  DAILY 90 tablet 0  . tamsulosin (FLOMAX) 0.4 MG CAPS capsule TAKE ONE CAPSULE BY MOUTH ONCE DAILY 30 capsule 4  . prazosin (MINIPRESS) 1 MG capsule Take 1 capsule by mouth two times daily (Patient not taking: Reported on 03/17/2016) 180 capsule 0   No current facility-administered medications for this visit.    Allergies:  Ace inhibitors; Angiotensin receptor blockers; Ciprofloxacin; Other; Oxycodone-acetaminophen; Ramipril; Toradol [ketorolac tromethamine]; Valium; and Doxycycline   Social History: The patient  reports that he quit smoking about 27 years ago. His smoking use included Cigarettes. He started smoking about 53 years ago. He has a 25.00 pack-year smoking history. He quit smokeless tobacco use about 27 years ago. His smokeless tobacco use included Chew. He reports that he drinks alcohol. He reports that he does not use drugs.   ROS:  Please see the history of present illness. Otherwise, complete review of systems is positive for chronic arthritic pains, bursitis in his left hip.  All other systems are reviewed and negative.   Physical Exam: VS:  BP 140/77   Pulse 71   Ht '5\' 6"'  (1.676 m)   Wt 227 lb (103 kg)   BMI 36.64 kg/m , BMI Body mass index is 36.64 kg/m.  Wt Readings from Last 3 Encounters:  03/17/16 227 lb (103 kg)  01/20/16 220 lb 3.2 oz (99.9 kg)  10/29/15 220 lb (99.8 kg)    General: Overweight male, appears comfortable at rest. HEENT: Conjunctiva  and lids normal, oropharynx clear. Neck: Supple, no elevated JVP or carotid bruits, no thyromegaly. Lungs: Clear to auscultation, nonlabored breathing at rest. Cardiac: Regular rate and rhythm, no S3 or significant systolic murmur, no pericardial rub. Abdomen: Protuberant, nontender, bowel sounds present, no guarding or rebound. Extremities: No pitting edema, distal pulses 2+. Skin: Warm and dry. Musculoskeletal: No kyphosis. Discomfort with raising left arm over level of shoulder. Neuropsychiatric: Alert and oriented x3, affect grossly appropriate.  ECG: I personally reviewed the tracing from 12/06/2014 which showed sinus rhythm.  Recent Labwork: 01/20/2016: ALT 24; AST 21; BUN 19; Creatinine, Ser 0.94; Potassium 4.2; Sodium 140     Component Value Date/Time   CHOL 143 01/20/2016 0906  CHOL 139 06/02/2012 1007   TRIG 186 (H) 01/20/2016 0906   TRIG 185 (H) 04/10/2013 0937   TRIG 107 06/02/2012 1007   HDL 39 (L) 01/20/2016 0906   HDL 34 (L) 04/10/2013 0937   HDL 40 06/02/2012 1007   CHOLHDL 3.7 01/20/2016 0906   CHOLHDL 6.2 05/14/2011 0550   VLDL UNABLE TO CALCULATE IF TRIGLYCERIDE OVER 400 mg/dL 05/14/2011 0550   LDLCALC 67 01/20/2016 0906   LDLCALC 62 04/10/2013 0937   LDLCALC 78 06/02/2012 1007    Other Studies Reviewed Today:  Lexiscan Myoview 09/17/2015:  No diagnostic ST segment changes to indicate ischemia.  Small, mild intensity, reversible anteroapical defect consistent with a small region of ischemia.  This is a low risk study.  Nuclear stress EF: 61%.  Assessment and Plan:  1. Symptomatically stable CAD status post BMS to nondominant RCA back in 2006 with otherwise moderate nonobstructive disease. Myoview study from last year was overall low risk and we plan to continue medical therapy and observation.  2. Hyperlipidemia, on Lipitor. LDL 67 in January.  3. Essential hypertension, systolic in the 998S today. No changes made to current regimen.  4. Type 2  diabetes mellitus, on Glucophage and insulin. He follows with Dr. Sabra Heck, recent hemoglobin A1c 8.3. We discussed weight loss.  Current medicines were reviewed with the patient today.   Orders Placed This Encounter  Procedures  . EKG 12-Lead    Disposition: Follow-up in 6 months.  Signed, Satira Sark, MD, [facility] 03/17/2016 9:01 AM    [facility] [place], [place], [place] [address] Phone: [phone]; Fax: [phone]

## 2016-03-17 ENCOUNTER — Ambulatory Visit (INDEPENDENT_AMBULATORY_CARE_PROVIDER_SITE_OTHER): Payer: Medicare Other | Admitting: Cardiology

## 2016-03-17 ENCOUNTER — Ambulatory Visit: Payer: Self-pay | Admitting: Cardiology

## 2016-03-17 ENCOUNTER — Encounter: Payer: Self-pay | Admitting: Cardiology

## 2016-03-17 VITALS — BP 140/77 | HR 71 | Ht 66.0 in | Wt 227.0 lb

## 2016-03-17 DIAGNOSIS — E1165 Type 2 diabetes mellitus with hyperglycemia: Secondary | ICD-10-CM

## 2016-03-17 DIAGNOSIS — I2510 Atherosclerotic heart disease of native coronary artery without angina pectoris: Secondary | ICD-10-CM | POA: Diagnosis not present

## 2016-03-17 DIAGNOSIS — IMO0002 Reserved for concepts with insufficient information to code with codable children: Secondary | ICD-10-CM

## 2016-03-17 DIAGNOSIS — Z794 Long term (current) use of insulin: Secondary | ICD-10-CM

## 2016-03-17 DIAGNOSIS — E782 Mixed hyperlipidemia: Secondary | ICD-10-CM | POA: Diagnosis not present

## 2016-03-17 DIAGNOSIS — I10 Essential (primary) hypertension: Secondary | ICD-10-CM

## 2016-03-17 DIAGNOSIS — E1159 Type 2 diabetes mellitus with other circulatory complications: Secondary | ICD-10-CM

## 2016-03-17 NOTE — Patient Instructions (Signed)

## 2016-03-22 DIAGNOSIS — M75102 Unspecified rotator cuff tear or rupture of left shoulder, not specified as traumatic: Secondary | ICD-10-CM | POA: Diagnosis not present

## 2016-03-22 DIAGNOSIS — Z4789 Encounter for other orthopedic aftercare: Secondary | ICD-10-CM | POA: Diagnosis not present

## 2016-04-10 ENCOUNTER — Other Ambulatory Visit: Payer: Self-pay | Admitting: Family Medicine

## 2016-04-22 ENCOUNTER — Encounter: Payer: Self-pay | Admitting: Family Medicine

## 2016-04-22 ENCOUNTER — Ambulatory Visit (INDEPENDENT_AMBULATORY_CARE_PROVIDER_SITE_OTHER): Payer: Medicare Other | Admitting: Family Medicine

## 2016-04-22 VITALS — BP 132/72 | HR 65 | Temp 98.3°F | Ht 66.0 in | Wt 224.0 lb

## 2016-04-22 DIAGNOSIS — Z794 Long term (current) use of insulin: Secondary | ICD-10-CM

## 2016-04-22 DIAGNOSIS — I10 Essential (primary) hypertension: Secondary | ICD-10-CM | POA: Diagnosis not present

## 2016-04-22 DIAGNOSIS — E1169 Type 2 diabetes mellitus with other specified complication: Secondary | ICD-10-CM | POA: Diagnosis not present

## 2016-04-22 DIAGNOSIS — E785 Hyperlipidemia, unspecified: Secondary | ICD-10-CM

## 2016-04-22 DIAGNOSIS — E669 Obesity, unspecified: Secondary | ICD-10-CM

## 2016-04-22 DIAGNOSIS — E1159 Type 2 diabetes mellitus with other circulatory complications: Secondary | ICD-10-CM | POA: Diagnosis not present

## 2016-04-22 LAB — BAYER DCA HB A1C WAIVED: HB A1C (BAYER DCA - WAIVED): 7.6 % — ABNORMAL HIGH (ref <7.0)

## 2016-04-22 MED ORDER — TAMSULOSIN HCL 0.4 MG PO CAPS: 0.4000 mg | ORAL_CAPSULE | Freq: Every day | ORAL | 1 refills | Status: DC

## 2016-04-22 NOTE — Progress Notes (Signed)
BP 132/72   Pulse 65   Temp 98.3 F (36.8 C) (Oral)   Ht 5\' 6"  (1.676 m)   Wt 224 lb (101.6 kg)   BMI 36.15 kg/m    Subjective:    Patient ID: [name], male    DOB: [birth year], 76 y.o.   MRN: [id number]  HPI: [name] is a 76 y.o. male presenting on 04/22/2016 for Diabetes (3 month followup); Hyperlipidemia; and Hypertension   HPI Type 2 diabetes mellitus Patient comes in today for recheck of his diabetes. Patient has been currently taking Lantus 52 and Humalog 8-10 with meals, he has metformin 1000 twice a day as well. He says his blood sugar was running 134 this morning and 202 last night about 3 hours after meals. Patient is currently on an ARB for known microalbuminuria. Patient has not seen an ophthalmologist this year. Patient denies any issues with his feet.   Hyperlipidemia Patient is coming in for recheck of his hyperlipidemia. He is currently taking atorvastatin. He denies any issues with myalgias or history of liver damage from it. He denies any focal numbness or weakness or chest pain.   Hypertension recheck Patient is coming in today for a hypertension recheck. He is currently on losartan and amlodipine. His blood pressure today is 132/72. Patient denies headaches, blurred vision, chest pains, shortness of breath, or weakness. Denies any side effects from medication and is content with current medication.   Relevant past medical, surgical, family and social history reviewed and updated as indicated. Interim medical history since our last visit reviewed. Allergies and medications reviewed and updated.  Review of Systems  Constitutional: Negative for chills and fever.  Respiratory: Negative for shortness of breath and wheezing.   Cardiovascular: Negative for chest pain and leg swelling.  Musculoskeletal: Positive for arthralgias. Negative for back pain and gait problem.  Skin: Negative for rash.  Neurological: Negative for dizziness, weakness,  light-headedness, numbness and headaches.  All other systems reviewed and are negative.   Per HPI unless specifically indicated above     Objective:    BP 132/72   Pulse 65   Temp 98.3 F (36.8 C) (Oral)   Ht 5\' 6"  (1.676 m)   Wt 224 lb (101.6 kg)   BMI 36.15 kg/m   Wt Readings from Last 3 Encounters:  04/22/16 224 lb (101.6 kg)  03/17/16 227 lb (103 kg)  01/20/16 220 lb 3.2 oz (99.9 kg)    Physical Exam  Constitutional: He is oriented to person, place, and time. He appears well-developed and well-nourished. No distress.  Eyes: Conjunctivae are normal. No scleral icterus.  Neck: Neck supple. No thyromegaly present.  Cardiovascular: Normal rate, regular rhythm, normal heart sounds and intact distal pulses.   No murmur heard. Pulmonary/Chest: Effort normal and breath sounds normal. No respiratory distress. He has no wheezes. He has no rales.  Abdominal: Soft. Bowel sounds are normal. He exhibits no distension. There is no tenderness. There is no rebound.  Musculoskeletal: Normal range of motion. He exhibits no edema.  Lymphadenopathy:    He has no cervical adenopathy.  Neurological: He is alert and oriented to person, place, and time. Coordination normal.  Skin: Skin is warm and dry. No rash noted. He is not diaphoretic.  Psychiatric: He has a normal mood and affect. His behavior is normal.  Nursing note and vitals reviewed.  Diabetic Foot Exam - Simple   Simple Foot Form Diabetic Foot exam was performed with the following findings:  Yes 04/22/2016  9:23 AM  Visual Inspection No deformities, no ulcerations, no other skin breakdown bilaterally:  Yes Sensation Testing Intact to touch and monofilament testing bilaterally:  Yes Pulse Check Posterior Tibialis and Dorsalis pulse intact bilaterally:  Yes Comments        Assessment & Plan:   Problem List Items Addressed This Visit      Cardiovascular and Mediastinum   Essential hypertension, benign   Type 2 diabetes  mellitus with circulatory disorder ([place]) - Primary   Relevant Orders   Bayer DCA Hb A1c Waived     Endocrine   Hyperlipidemia associated with type 2 diabetes mellitus ([place])     Other   Obesity (BMI 30-39.9)      Patient's hemoglobin A1c last time was elevated, likely needs more mealtime coverage, we'll check his A1c today and we'll see where it sat but will likely increase his Humalog from 8 with meals to 10 or 12 with meals depending on what the A1c shows.  Follow up plan: Return in about 3 months (around 07/22/2016), or if symptoms worsen or fail to improve, for Diabetes and cholesterol recheck.  Counseling provided for all of the vaccine components Orders Placed This Encounter  Procedures  . Bayer [facility] Hb A1c [place], MD [facility] 04/22/2016, 9:20 AM

## 2016-05-03 DIAGNOSIS — M7541 Impingement syndrome of right shoulder: Secondary | ICD-10-CM | POA: Diagnosis not present

## 2016-05-03 DIAGNOSIS — M75102 Unspecified rotator cuff tear or rupture of left shoulder, not specified as traumatic: Secondary | ICD-10-CM | POA: Diagnosis not present

## 2016-05-14 ENCOUNTER — Ambulatory Visit (INDEPENDENT_AMBULATORY_CARE_PROVIDER_SITE_OTHER): Payer: Medicare Other | Admitting: *Deleted

## 2016-05-14 VITALS — BP 112/67 | HR 66 | Temp 97.1°F | Ht 66.0 in | Wt 220.0 lb

## 2016-05-14 DIAGNOSIS — Z0000 Encounter for general adult medical examination without abnormal findings: Secondary | ICD-10-CM

## 2016-05-14 DIAGNOSIS — Z23 Encounter for immunization: Secondary | ICD-10-CM

## 2016-05-14 NOTE — Progress Notes (Signed)
Subjective:   [name] is a 76 y.o. male who presents for an Initial Medicare Annual Wellness Visit.  Pt is retired from [employer]. He likes to hunt and fish. He is always busy mowing or on tractor. He does eat 3 or 4 meals a day, not always healthy. He [place] his [place] farm. He has one son close by. No pets. We discussed fall prevention. He states his health is the same as it was last year at this time.      Objective:    Today's Vitals   05/14/16 1547  BP: 112/67  Pulse: 66  Temp: 97.1 F (36.2 C)  TempSrc: Oral  Weight: 220 lb (99.8 kg)  Height: '5\' 6"'  (1.676 m)   Body mass index is 35.51 kg/m.  Current Medications (verified) Outpatient Encounter Prescriptions as of 05/14/2016  Medication Sig  . acetaminophen (TYLENOL) 650 MG CR tablet Take 650 mg by mouth every 8 (eight) hours as needed for pain.  Marland Kitchen amLODipine (NORVASC) 5 MG tablet TAKE 1 TABLET BY MOUTH  DAILY  . aspirin 81 MG tablet Take 81 mg by mouth every morning.   Marland Kitchen atorvastatin (LIPITOR) 20 MG tablet TAKE 1 TABLET BY MOUTH  DAILY AT 6 PM.  . Blood Glucose Monitoring Suppl (ONE TOUCH ULTRA SYSTEM KIT) W/DEVICE KIT 1 kit by Does not apply route once.  . butalbital-acetaminophen-caffeine (FIORICET, ESGIC) 50-325-40 MG tablet Take 1 tablet by mouth 2 (two) times daily as needed for headache.  . cetirizine (ZYRTEC) 10 MG tablet Take 10 mg by mouth every evening.   . chlorthalidone (HYGROTON) 25 MG tablet TAKE 1 TABLET BY MOUTH  DAILY AS DIRECTED  . Cholecalciferol (VITAMIN D3) 2000 UNITS TABS Take 1 tablet by mouth every morning.   . famotidine (PEPCID) 20 MG tablet TAKE 1 TABLET BY MOUTH TWO  TIMES DAILY  . HUMALOG KWIKPEN 100 UNIT/ML KiwkPen INJECT 0-15 UNITS INTO SKIN THREE TIMES DAILY BEFORE  MEALS. 70-150=8 UNITS  151-200= 9 UNITS  . Insulin Pen Needle (B-D ULTRAFINE III SHORT PEN) 31G X 8 MM MISC USE 4 TIMES DAILY DX E11.49  . LANTUS SOLOSTAR 100 UNIT/ML Solostar Pen INJECT SUBCUTANEOUSLY 52  UNITS  AT BEDTIME  . losartan (COZAAR) 50 MG tablet TAKE 1 TABLET BY MOUTH  DAILY  . metFORMIN (GLUCOPHAGE-XR) 500 MG 24 hr tablet Take 2 tablets by mouth two times daily  . nitroGLYCERIN (NITROSTAT) 0.4 MG SL tablet USE AS DIRECTED AS NEEDED FOR CHEST PAIN  . ONE TOUCH ULTRA TEST test strip 1 each by Other route 4 (four) times daily.  Marland Kitchen PARoxetine (PAXIL) 20 MG tablet TAKE 1 TABLET BY MOUTH  DAILY  . tamsulosin (FLOMAX) 0.4 MG CAPS capsule Take 1 capsule (0.4 mg total) by mouth daily.  . [DISCONTINUED] prazosin (MINIPRESS) 1 MG capsule Take 1 capsule by mouth two times daily   No facility-administered encounter medications on file as of 05/14/2016.     Allergies (verified) Ace inhibitors; Angiotensin receptor blockers; Ciprofloxacin; Other; Oxycodone-acetaminophen; Ramipril; Toradol [ketorolac tromethamine]; Valium; and Doxycycline   History: Past Medical History:  Diagnosis Date  . Acute encephalopathy 08/2010  . Adenomatous polyp   . Anxiety disorder   . Arthritis   . Chronic headaches   . Chronic headaches   . Common migraine 12/23/2014  . Coronary atherosclerosis of native coronary artery    BMS nondominant RCA 12/2004  . Diverticulosis   . Essential hypertension, benign   . Gallbladder cholesterolosis   .  GERD (gastroesophageal reflux disease)   . Hyperlipidemia   . Internal hemorrhoids   . Myocardial infarction ([place])    2006/1 stent  . Nephrolithiasis   . Shoulder pain, right   . Type 2 diabetes mellitus ([place])    Past Surgical History:  Procedure Laterality Date  . CATARACT EXTRACTION    . CHOLECYSTECTOMY    . COLONOSCOPY  09/10/2008   normal  . CYSTOSCOPY W/ URETEROSCOPY W/ LITHOTRIPSY  08/2010  . JOINT REPLACEMENT     right hip  . LEFT HEART CATHETERIZATION WITH CORONARY ANGIOGRAM N/A 05/14/2011   Procedure: LEFT HEART CATHETERIZATION WITH CORONARY ANGIOGRAM;  Surgeon: Sherren Mocha, MD;  Location: [facility] CATH LAB;  Service: Cardiovascular;  Laterality: N/A;  . ROTATOR CUFF  REPAIR     right  . ROTATOR CUFF REPAIR Left 12/11/2015  . TOTAL KNEE ARTHROPLASTY     Bil  . UPPER GASTROINTESTINAL ENDOSCOPY  01/08/2008   bx, inlet patch, duodenitis  . YAG LASER APPLICATION Right 3/56/8616   Procedure: YAG LASER APPLICATION;  Surgeon: Williams Che, MD;  Location: [facility];  Service: Ophthalmology;  Laterality: Right;   Family History  Problem Relation Age of Onset  . Colon cancer Mother     Diagnosed age 48  . Heart disease Father   . Breast cancer Sister   . Heart disease Brother   . Brain cancer Sister   . Heart disease Brother   . Bladder Cancer Brother   . Cancer - Other Brother    Social History   Occupational History  . 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 Smoking status: Former Smoker    Packs/day: 1.00    Years: 25.00    Types: Cigarettes    Start date: 01/18/1963    Quit date: 08/11/1988  . Smokeless tobacco: Former Systems developer    Types: Chew    Quit date: 01/11/1989     Comment: chewed 1 pack tobacco/day for 15 years  . Alcohol use 0.0 oz/week     Comment: Very rarely  . Drug use: No  . Sexual activity: Not Currently   Tobacco Counseling Counseling given: Not Answered  Stopped smoking in [place] of Daily Living In your present state of health, do you have any difficulty performing the following activities: 05/14/2016  Hearing? Y  Vision? N  Difficulty concentrating or making decisions? N  Walking or climbing stairs? N  Dressing or bathing? N  Doing errands, shopping? N  Some recent data might be hidden   HOH in right ear, hearing aid did not help Immunizations and Health Maintenance Immunization History  Administered Date(s) Administered  . Influenza, High Dose Seasonal PF 10/29/2015  . Influenza,inj,Quad PF,36+ Mos 11/08/2013  . Influenza-Unspecified 11/18/2014  . Pneumococcal Conjugate-13 01/01/2013  . Tdap 01/01/2013   Health Maintenance Due  Topic Date Due  . OPHTHALMOLOGY EXAM  07/25/2015   He is  making appt for this next week. Patient Care Team: Worthy Rancher, MD as PCP - General (Family Medicine) Satira Sark, MD as Consulting Physician (Cardiology) Sydnee Cabal, MD as Consulting Physician (Orthopedic Surgery) Gaynelle Arabian, MD as Consulting Physician (Orthopedic Surgery)  Indicate any recent Medical Services you may have received from other than [facility] providers in the past year (date may be approximate).    Assessment:   This is a routine wellness examination for Omarie.  Hearing/Vision screen No exam data present No problems with vision. HOH in right ear  Dietary issues and exercise activities discussed:    Goals    . HEMOGLOBIN A1C < 7.0    . Weight (lb) < 185 lb (83.9 kg)      Depression Screen PHQ 2/9 Scores 05/14/2016 04/22/2016 01/20/2016 10/29/2015  PHQ - 2 Score 0 0 1 0    Fall Risk Fall Risk  05/14/2016 04/22/2016 10/29/2015 03/13/2015 12/10/2014  Falls in the past year? No No Yes Yes Yes  Number falls in past yr: - - '1 1 2 ' or more  Injury with Fall? - - Yes Yes Yes    Cognitive Function: MMSE - Mini Mental State Exam 05/14/2016  Orientation to time 5  Orientation to Place 5  Registration 3  Attention/ Calculation 5  Recall 3  Language- name 2 objects 2  Language- repeat 1  Language- follow 3 step command 3  Language- read & follow direction 1  Write a sentence 1  Copy design 1  Total score 30      30/30  Screening Tests Health Maintenance  Topic Date Due  . OPHTHALMOLOGY EXAM  07/25/2015  . PNA vac Low Risk Adult (2 of 2 - PPSV23) 07/22/2016 (Originally 01/01/2014)  . INFLUENZA VACCINE  08/11/2016  . HEMOGLOBIN A1C  10/22/2016  . FOOT EXAM  04/22/2017  . TETANUS/TDAP  01/02/2023        Plan:   Shingrix given today (dose 1). His next appt is July 23, 2016. He will get 2nd dose then.. He also needs PCV 23, but does not want to take together. He is scheduling an eye exam next week for August. We do not have an up to date CXR, he is  going to bring [place] the last one.  I have personally reviewed and noted the following in the patient's chart:   . Medical and social history . Use of alcohol, tobacco or illicit drugs  . Current medications and supplements . Functional ability and status . Nutritional status . Physical activity . Advanced directives . List of other physicians . Hospitalizations, surgeries, and ER visits in previous 12 months . Vitals . Screenings to include cognitive, depression, and falls . Referrals and appointments  In addition, I have reviewed and discussed with patient certain preventive protocols, quality metrics, and best practice recommendations. A written personalized care plan for preventive services as well as general preventive health recommendations were provided to patient.     Rana Snare, LPN   02/16/4268    I have reviewed and agree with the above AWV documentation.   Caryl Pina, MD [facility] 05/17/2016, 8:12 AM

## 2016-05-14 NOTE — Patient Instructions (Addendum)
  [name] , Thank you for taking time to come for your Medicare Wellness Visit. I appreciate your ongoing commitment to your health goals. Please review the following plan we discussed and let me know if I can assist you in the future.   These are the goals we discussed: Goals    . HEMOGLOBIN A1C < 7.0    . Weight (lb) < 185 lb (83.9 kg)       This is a list of the screening recommended for you and due dates:  Health Maintenance  Topic Date Due  . Eye exam for diabetics  07/25/2015  . Pneumonia vaccines (2 of 2 - PPSV23) 07/22/2016*  . Flu Shot  08/11/2016  . Hemoglobin A1C  10/22/2016  . Complete foot exam   04/22/2017  . Tetanus Vaccine  01/02/2023  *Topic was postponed. The date shown is not the original due date.   Getting shringrix today.

## 2016-06-14 ENCOUNTER — Other Ambulatory Visit: Payer: Self-pay | Admitting: Family Medicine

## 2016-06-17 ENCOUNTER — Other Ambulatory Visit: Payer: Self-pay | Admitting: Family Medicine

## 2016-07-13 ENCOUNTER — Other Ambulatory Visit: Payer: Self-pay | Admitting: Family Medicine

## 2016-07-23 ENCOUNTER — Other Ambulatory Visit: Payer: Self-pay | Admitting: Family Medicine

## 2016-07-23 ENCOUNTER — Encounter: Payer: Self-pay | Admitting: Family Medicine

## 2016-07-23 ENCOUNTER — Ambulatory Visit (INDEPENDENT_AMBULATORY_CARE_PROVIDER_SITE_OTHER): Payer: Medicare Other | Admitting: Family Medicine

## 2016-07-23 VITALS — BP 122/66 | HR 73 | Temp 97.6°F | Ht 66.0 in | Wt 224.0 lb

## 2016-07-23 DIAGNOSIS — E1159 Type 2 diabetes mellitus with other circulatory complications: Secondary | ICD-10-CM | POA: Diagnosis not present

## 2016-07-23 DIAGNOSIS — E785 Hyperlipidemia, unspecified: Secondary | ICD-10-CM | POA: Diagnosis not present

## 2016-07-23 DIAGNOSIS — E1169 Type 2 diabetes mellitus with other specified complication: Secondary | ICD-10-CM | POA: Diagnosis not present

## 2016-07-23 DIAGNOSIS — E1129 Type 2 diabetes mellitus with other diabetic kidney complication: Secondary | ICD-10-CM

## 2016-07-23 DIAGNOSIS — I10 Essential (primary) hypertension: Secondary | ICD-10-CM

## 2016-07-23 DIAGNOSIS — R809 Proteinuria, unspecified: Secondary | ICD-10-CM

## 2016-07-23 DIAGNOSIS — Z794 Long term (current) use of insulin: Secondary | ICD-10-CM | POA: Diagnosis not present

## 2016-07-23 LAB — BAYER DCA HB A1C WAIVED: HB A1C (BAYER DCA - WAIVED): 7.3 % — ABNORMAL HIGH (ref <7.0)

## 2016-07-23 MED ORDER — DULAGLUTIDE 1.5 MG/0.5ML ~~LOC~~ SOAJ: 1.5000 mg | SUBCUTANEOUS | 2 refills | Status: DC

## 2016-07-23 NOTE — Progress Notes (Signed)
BP 122/66   Pulse 73   Temp 97.6 F (36.4 C) (Oral)   Ht '5\' 6"'  (1.676 m)   Wt 224 lb (101.6 kg)   BMI 36.15 kg/m    Subjective:    Patient ID: [name], male    DOB: [birth year], 76 y.o.   MRN: [id number]  HPI: [name] is a 76 y.o. male presenting on 07/23/2016 for Diabetes (pt here today for routine follow up of his chronic medical conditions)   HPI Type 2 diabetes mellitus Patient comes in today for recheck of his diabetes. Patient has been currently taking Lantus 52 units and metformin and Humalog 10 units 3 times a day plus sliding scale. He says that his blood sugars are typically running between 90 and 200 but he does have a few that are over 200. Patient is currently on an ACE inhibitor/ARB. Patient has not seen an ophthalmologist this year. Patient denies any issues with their feet. Patient has known microalbuminuria but is on treatment for it.  Hypertension Patient is currently on losartan and chlorthalidone and amlodipine, and their blood pressure today is 122/66. Patient denies any lightheadedness or dizziness. Patient denies headaches, blurred vision, chest pains, shortness of breath, or weakness. Denies any side effects from medication and is content with current medication.   Hyperlipidemia Patient is coming in for recheck of his hyperlipidemia. The patient is currently taking Lipitor and a baby aspirin. They deny any issues with myalgias or history of liver damage from it. They deny any focal numbness or weakness or chest pain.   Relevant past medical, surgical, family and social history reviewed and updated as indicated. Interim medical history since our last visit reviewed. Allergies and medications reviewed and updated.  Review of Systems  Constitutional: Negative for chills and fever.  Eyes: Negative for discharge.  Respiratory: Negative for shortness of breath and wheezing.   Cardiovascular: Negative for chest pain and leg swelling.    Genitourinary: Negative for decreased urine volume and difficulty urinating.  Musculoskeletal: Negative for back pain and gait problem.  Skin: Negative for rash.  Neurological: Negative for dizziness, weakness, light-headedness and numbness.  All other systems reviewed and are negative.   Per HPI unless specifically indicated above   Allergies as of 07/23/2016      Reactions   Ace Inhibitors Hives, Swelling, Rash   Rash,hives,tongue swelling   Angiotensin Receptor Blockers    Ciprofloxacin Other (See Comments)   confusion   Other Hives, Other (See Comments)   altaseptic   Oxycodone-acetaminophen Other (See Comments)   REACTION: unknown reaction   Ramipril Cough   Toradol [ketorolac Tromethamine]    confusion   Valium Other (See Comments)   Hallucinations; confusion   Doxycycline Hives, Rash      Medication List       Accurate as of 07/23/16  9:00 AM. Always use your most recent med list.          acetaminophen 650 MG CR tablet Commonly known as:  TYLENOL Take 650 mg by mouth every 8 (eight) hours as needed for pain.   amLODipine 5 MG tablet Commonly known as:  NORVASC TAKE 1 TABLET BY MOUTH  DAILY   aspirin 81 MG tablet Take 81 mg by mouth every morning.   atorvastatin 20 MG tablet Commonly known as:  LIPITOR TAKE 1 TABLET BY MOUTH  DAILY AT 6 PM.   butalbital-acetaminophen-caffeine 50-325-40 MG tablet Commonly known as:  FIORICET, ESGIC Take 1 tablet by mouth  2 (two) times daily as needed for headache.   cetirizine 10 MG tablet Commonly known as:  ZYRTEC Take 10 mg by mouth every evening.   chlorthalidone 25 MG tablet Commonly known as:  HYGROTON TAKE 1 TABLET BY MOUTH  DAILY AS DIRECTED   famotidine 20 MG tablet Commonly known as:  PEPCID TAKE 1 TABLET BY MOUTH TWO  TIMES DAILY   HUMALOG KWIKPEN 100 UNIT/ML KiwkPen Generic drug:  insulin lispro INJECT 0-15 UNITS INTO SKIN THREE TIMES DAILY BEFORE  MEALS. 70-150=8 UNITS  151-200= 9 UNITS    Insulin Pen Needle 31G X 8 MM Misc Commonly known as:  B-D ULTRAFINE III SHORT PEN USE 4 TIMES DAILY DX E11.49   LANTUS SOLOSTAR 100 UNIT/ML Solostar Pen Generic drug:  Insulin Glargine INJECT SUBCUTANEOUSLY 52  UNITS AT BEDTIME   losartan 50 MG tablet Commonly known as:  COZAAR TAKE 1 TABLET BY MOUTH  DAILY   metFORMIN 500 MG 24 hr tablet Commonly known as:  GLUCOPHAGE-XR TAKE 2 TABLETS BY MOUTH TWO TIMES DAILY   nitroGLYCERIN 0.4 MG SL tablet Commonly known as:  NITROSTAT USE AS DIRECTED AS NEEDED FOR CHEST PAIN   ONE TOUCH ULTRA SYSTEM KIT w/Device Kit 1 kit by Does not apply route once.   ONE TOUCH ULTRA TEST test strip Generic drug:  glucose blood 1 each by Other route 4 (four) times daily.   PARoxetine 20 MG tablet Commonly known as:  PAXIL TAKE 1 TABLET BY MOUTH  DAILY   tamsulosin 0.4 MG Caps capsule Commonly known as:  FLOMAX Take 1 capsule (0.4 mg total) by mouth daily.   Vitamin D3 2000 units Tabs Take 1 tablet by mouth every morning.          Objective:    BP 122/66   Pulse 73   Temp 97.6 F (36.4 C) (Oral)   Ht '5\' 6"'  (1.676 m)   Wt 224 lb (101.6 kg)   BMI 36.15 kg/m   Wt Readings from Last 3 Encounters:  07/23/16 224 lb (101.6 kg)  05/14/16 220 lb (99.8 kg)  04/22/16 224 lb (101.6 kg)    Physical Exam  Constitutional: He is oriented to person, place, and time. He appears well-developed and well-nourished. No distress.  Eyes: Conjunctivae are normal. No scleral icterus.  Cardiovascular: Normal rate, regular rhythm, normal heart sounds and intact distal pulses.   No murmur heard. Pulmonary/Chest: Effort normal and breath sounds normal. No respiratory distress. He has no wheezes.  Musculoskeletal: Normal range of motion. He exhibits no edema.  Neurological: He is alert and oriented to person, place, and time. Coordination normal.  Skin: Skin is warm and dry. No rash noted. He is not diaphoretic.  Psychiatric: He has a normal mood and  affect. His behavior is normal.  Nursing note and vitals reviewed.   Results for orders placed or performed in visit on 04/22/16  Bayer DCA Hb A1c Waived  Result Value Ref Range   Bayer DCA Hb A1c Waived 7.6 (H) <7.0 %      Assessment & Plan:   Problem List Items Addressed This Visit      Cardiovascular and Mediastinum   Essential hypertension, benign   Relevant Orders   CMP14+EGFR (Completed)   Type 2 diabetes mellitus with circulatory disorder ([place]) - Primary   Relevant Medications   Dulaglutide (TRULICITY) 1.5 UD/1.4HF SOPN   Other Relevant Orders   Bayer DCA Hb A1c Waived (Completed)   CMP14+EGFR (Completed)     Endocrine  Hyperlipidemia associated with type 2 diabetes mellitus ([facility])   Relevant Medications   Dulaglutide (TRULICITY) 1.5 ZL/9.3TT SOPN   Other Relevant Orders   Lipid panel (Completed)   Microalbuminuria due to type 2 diabetes mellitus ([place])   Relevant Medications   Dulaglutide (TRULICITY) 1.5 SV/7.7LT SOPN       Follow up plan: Return in about 3 months (around 10/23/2016), or if symptoms worsen or fail to improve, for Recheck diabetes in 3 months.  Counseling provided for all of the vaccine components Orders Placed This Encounter  Procedures  . Bayer DCA Hb A1c Waived  . CMP14+EGFR  . Lipid panel    Caryl Pina, MD [facility] 07/23/2016, 9:00 AM

## 2016-07-24 LAB — CMP14+EGFR
A/G RATIO: 1.4 (ref 1.2–2.2)
ALBUMIN: 4 g/dL (ref 3.5–4.8)
ALT: 21 IU/L (ref 0–44)
AST: 18 IU/L (ref 0–40)
Alkaline Phosphatase: 58 IU/L (ref 39–117)
BILIRUBIN TOTAL: 0.4 mg/dL (ref 0.0–1.2)
BUN / CREAT RATIO: 18 (ref 10–24)
BUN: 18 mg/dL (ref 8–27)
CALCIUM: 9.2 mg/dL (ref 8.6–10.2)
CHLORIDE: 99 mmol/L (ref 96–106)
CO2: 24 mmol/L (ref 20–29)
Creatinine, Ser: 1.01 mg/dL (ref 0.76–1.27)
GFR, EST AFRICAN AMERICAN: 83 mL/min/{1.73_m2} (ref >59)
GFR, EST NON AFRICAN AMERICAN: 72 mL/min/{1.73_m2} (ref >59)
GLOBULIN, TOTAL: 2.9 g/dL (ref 1.5–4.5)
Glucose: 176 mg/dL — ABNORMAL HIGH (ref 65–99)
POTASSIUM: 3.8 mmol/L (ref 3.5–5.2)
SODIUM: 141 mmol/L (ref 134–144)
TOTAL PROTEIN: 6.9 g/dL (ref 6.0–8.5)

## 2016-07-24 LAB — LIPID PANEL
CHOL/HDL RATIO: 3.4 ratio (ref 0.0–5.0)
Cholesterol, Total: 122 mg/dL (ref 100–199)
HDL: 36 mg/dL — AB (ref >39)
LDL Calculated: 50 mg/dL (ref 0–99)
Triglycerides: 182 mg/dL — ABNORMAL HIGH (ref 0–149)
VLDL Cholesterol Cal: 36 mg/dL (ref 5–40)

## 2016-07-26 ENCOUNTER — Other Ambulatory Visit: Payer: Self-pay | Admitting: *Deleted

## 2016-07-26 MED ORDER — ATORVASTATIN CALCIUM 20 MG PO TABS: ORAL_TABLET | ORAL | 1 refills | Status: DC

## 2016-08-04 ENCOUNTER — Other Ambulatory Visit: Payer: Self-pay | Admitting: Family Medicine

## 2016-08-04 NOTE — Telephone Encounter (Signed)
Samples left in fridge for pick up. Left detailed message on machine at home number

## 2016-08-16 ENCOUNTER — Other Ambulatory Visit: Payer: Self-pay | Admitting: Family Medicine

## 2016-08-22 ENCOUNTER — Other Ambulatory Visit: Payer: Self-pay | Admitting: Family Medicine

## 2016-08-23 DIAGNOSIS — Z961 Presence of intraocular lens: Secondary | ICD-10-CM | POA: Diagnosis not present

## 2016-08-23 DIAGNOSIS — H2512 Age-related nuclear cataract, left eye: Secondary | ICD-10-CM | POA: Diagnosis not present

## 2016-08-23 DIAGNOSIS — E119 Type 2 diabetes mellitus without complications: Secondary | ICD-10-CM | POA: Diagnosis not present

## 2016-08-23 DIAGNOSIS — H02834 Dermatochalasis of left upper eyelid: Secondary | ICD-10-CM | POA: Diagnosis not present

## 2016-08-23 DIAGNOSIS — H02831 Dermatochalasis of right upper eyelid: Secondary | ICD-10-CM | POA: Diagnosis not present

## 2016-08-30 ENCOUNTER — Other Ambulatory Visit: Payer: Self-pay | Admitting: Family Medicine

## 2016-08-31 ENCOUNTER — Telehealth: Payer: Self-pay | Admitting: Family Medicine

## 2016-08-31 NOTE — Telephone Encounter (Signed)
Samples placed in refrigerator for pick up 

## 2016-09-10 ENCOUNTER — Ambulatory Visit (INDEPENDENT_AMBULATORY_CARE_PROVIDER_SITE_OTHER): Payer: Medicare Other | Admitting: Family Medicine

## 2016-09-10 ENCOUNTER — Encounter: Payer: Self-pay | Admitting: Family Medicine

## 2016-09-10 VITALS — BP 114/74 | HR 94 | Temp 97.6°F | Ht 66.0 in | Wt 217.0 lb

## 2016-09-10 DIAGNOSIS — J209 Acute bronchitis, unspecified: Secondary | ICD-10-CM

## 2016-09-10 MED ORDER — PREDNISONE 20 MG PO TABS: ORAL_TABLET | ORAL | 0 refills | Status: DC

## 2016-09-10 MED ORDER — AZITHROMYCIN 250 MG PO TABS: ORAL_TABLET | ORAL | 0 refills | Status: DC

## 2016-09-10 NOTE — Progress Notes (Signed)
BP 114/74   Pulse 94   Temp 97.6 F (36.4 C) (Oral)   Ht 5\' 6"  (1.676 m)   Wt 217 lb (98.4 kg)   BMI 35.02 kg/m    Subjective:    Patient ID: [name], male    DOB: 04-Sep-1940, 76 y.o.   MRN: [id number]  HPI: [name] is a 76 y.o. male presenting on 09/10/2016 for URI (hoarseness, cough; began 7-8 days ago)   HPI Cough and congestion and wheezing Patient has been having cough and congestion and wheezing has been going on for the past week. He says the cough is worse at night and he wakes up in the morning very congested and with lots of wheezing. He has had some wheezing at night and the little bit throughout the day but mostly at night. He denies any fevers or chills. His wife has been L with a similar illness and had some cough syrup that he tried as well that did not help significantly. He denies any shortness of breath or chest pain but does have the wheezing.  Relevant past medical, surgical, family and social history reviewed and updated as indicated. Interim medical history since our last visit reviewed. Allergies and medications reviewed and updated.  Review of Systems  Constitutional: Negative for chills and fever.  HENT: Positive for congestion, postnasal drip, rhinorrhea, sinus pressure and sore throat. Negative for ear discharge, ear pain, sneezing and voice change.   Eyes: Negative for pain, discharge, redness and visual disturbance.  Respiratory: Positive for cough and wheezing. Negative for shortness of breath.   Cardiovascular: Negative for chest pain and leg swelling.  Musculoskeletal: Negative for gait problem.  Skin: Negative for rash.  All other systems reviewed and are negative.  Per HPI unless specifically indicated above     Objective:    BP 114/74   Pulse 94   Temp 97.6 F (36.4 C) (Oral)   Ht 5\' 6"  (1.676 m)   Wt 217 lb (98.4 kg)   BMI 35.02 kg/m   Wt Readings from Last 3 Encounters:  09/10/16 217 lb (98.4 kg)  07/23/16 224  lb (101.6 kg)  05/14/16 220 lb (99.8 kg)    Physical Exam  Constitutional: He is oriented to person, place, and time. He appears well-developed and well-nourished. No distress.  HENT:  Right Ear: Tympanic membrane, external ear and ear canal normal.  Left Ear: Tympanic membrane, external ear and ear canal normal.  Nose: Mucosal edema and rhinorrhea present. No sinus tenderness. No epistaxis. Right sinus exhibits no maxillary sinus tenderness and no frontal sinus tenderness. Left sinus exhibits no maxillary sinus tenderness and no frontal sinus tenderness.  Mouth/Throat: Uvula is midline and mucous membranes are normal. Posterior oropharyngeal edema and posterior oropharyngeal erythema present. No oropharyngeal exudate or tonsillar abscesses.  Eyes: Pupils are equal, round, and reactive to light. Conjunctivae and EOM are normal. No scleral icterus.  Neck: Neck supple. No thyromegaly present.  Cardiovascular: Normal rate, regular rhythm, normal heart sounds and intact distal pulses.   No murmur heard. Pulmonary/Chest: Effort normal. No respiratory distress. He has wheezes. He has no rales.  Musculoskeletal: Normal range of motion. He exhibits no edema.  Lymphadenopathy:    He has no cervical adenopathy.  Neurological: He is alert and oriented to person, place, and time. Coordination normal.  Skin: Skin is warm and dry. No rash noted. He is not diaphoretic.  Psychiatric: He has a normal mood and affect. His behavior is normal.  Nursing note and vitals reviewed.     Assessment & Plan:   Problem List Items Addressed This Visit    None    Visit Diagnoses    Acute bronchitis, unspecified organism    -  Primary   Relevant Medications   azithromycin (ZITHROMAX) 250 MG tablet   predniSONE (DELTASONE) 20 MG tablet       Follow up plan: Return if symptoms worsen or fail to improve.  Counseling provided for all of the vaccine components No orders of the defined types were placed in this  encounter.   Caryl Pina, MD [facility] 09/10/2016, 11:22 AM

## 2016-09-13 NOTE — Progress Notes (Signed)
Cardiology Office Note  Date: 09/14/2016   ID: [name], DOB [birth year], MRN [id number]  PCP: Dettinger, Fransisca Kaufmann, MD  Primary Cardiologist: Rozann Lesches, MD   Chief Complaint  Patient presents with  . Coronary Artery Disease    History of Present Illness: [name] is a 76 y.o. male last seen in March. He presents for a routine follow-up visit. Reports no angina symptoms, states active on his property, enjoys walking for exercise. He reports NYHA class II dyspnea.  He continues to follow for primary care at [facility], recently diagnosed with bronchitis. Has been on a course of steroids and antibiotics, reports feeling better.  Current cardiac regimen includes aspirin, Norvasc, Lipitor, chlorthalidone, Cozaar, and as needed nitroglycerin.  Recent lab work is noted below with LDL 50.  He underwent follow-up stress testing last September, low risk Myoview as outlined below.  Past Medical History:  Diagnosis Date  . Acute encephalopathy 08/2010  . Adenomatous polyp   . Anxiety disorder   . Arthritis   . Chronic headaches   . Common migraine 12/23/2014  . Coronary atherosclerosis of native coronary artery    BMS nondominant RCA 12/2004  . Diverticulosis   . Essential hypertension, benign   . Gallbladder cholesterolosis   . GERD (gastroesophageal reflux disease)   . Hyperlipidemia   . Internal hemorrhoids   . Myocardial infarction ([place])    2006/1 stent  . Nephrolithiasis   . Shoulder pain, right   . Type 2 diabetes mellitus ([place])     Past Surgical History:  Procedure Laterality Date  . CATARACT EXTRACTION    . CHOLECYSTECTOMY    . COLONOSCOPY  09/10/2008   normal  . CYSTOSCOPY W/ URETEROSCOPY W/ LITHOTRIPSY  08/2010  . JOINT REPLACEMENT     right hip  . LEFT HEART CATHETERIZATION WITH CORONARY ANGIOGRAM N/A 05/14/2011   Procedure: LEFT HEART CATHETERIZATION WITH CORONARY ANGIOGRAM;  Surgeon: Sherren Mocha, MD;  Location: [facility] CATH LAB;  Service:  Cardiovascular;  Laterality: N/A;  . ROTATOR CUFF REPAIR     right  . ROTATOR CUFF REPAIR Left 12/11/2015  . TOTAL KNEE ARTHROPLASTY     Bil  . UPPER GASTROINTESTINAL ENDOSCOPY  01/08/2008   bx, inlet patch, duodenitis  . YAG LASER APPLICATION Right 4/70/9628   Procedure: YAG LASER APPLICATION;  Surgeon: Williams Che, MD;  Location: [facility] ORS;  Service: Ophthalmology;  Laterality: Right;    Current Outpatient Prescriptions  Medication Sig Dispense Refill  . acetaminophen (TYLENOL) 650 MG CR tablet Take 650 mg by mouth every 8 (eight) hours as needed for pain.    Marland Kitchen amLODipine (NORVASC) 5 MG tablet TAKE 1 TABLET BY MOUTH  DAILY 90 tablet 1  . aspirin 81 MG tablet Take 81 mg by mouth every morning.     Marland Kitchen atorvastatin (LIPITOR) 20 MG tablet TAKE 1 TABLET BY MOUTH  DAILY AT 6 PM. 90 tablet 1  . azithromycin (ZITHROMAX) 250 MG tablet Take 2 the first day and then one each day after. 6 tablet 0  . Blood Glucose Monitoring Suppl (ONE TOUCH ULTRA SYSTEM KIT) W/DEVICE KIT 1 kit by Does not apply route once. 1 each 0  . butalbital-acetaminophen-caffeine (FIORICET, ESGIC) 50-325-40 MG tablet Take 1 tablet by mouth 2 (two) times daily as needed for headache. 14 tablet 0  . cetirizine (ZYRTEC) 10 MG tablet Take 10 mg by mouth every evening.     . chlorthalidone (HYGROTON) 25 MG tablet TAKE 1 TABLET BY MOUTH  DAILY AS DIRECTED 90 tablet 3  . Cholecalciferol (VITAMIN D3) 2000 UNITS TABS Take 1 tablet by mouth every morning.     . Dulaglutide (TRULICITY) 1.5 ZH/2.9JM SOPN Inject 1.5 mg into the skin once a week. 4 pen 2  . famotidine (PEPCID) 20 MG tablet TAKE 1 TABLET BY MOUTH TWO  TIMES DAILY 180 tablet 1  . HUMALOG KWIKPEN 100 UNIT/ML KiwkPen INJECT 0-15 UNITS INTO SKIN THREE TIMES DAILY BEFORE  MEALS. 70-150=8 UNITS  151-200= 9 UNITS 45 mL 2  . Insulin Pen Needle (B-D ULTRAFINE III SHORT PEN) 31G X 8 MM MISC USE 4 TIMES DAILY DX E11.49 400 each 1  . LANTUS SOLOSTAR 100 UNIT/ML Solostar Pen INJECT  SUBCUTANEOUSLY 52  UNITS AT BEDTIME 60 mL 0  . losartan (COZAAR) 50 MG tablet TAKE 1 TABLET BY MOUTH  DAILY 90 tablet 1  . metFORMIN (GLUCOPHAGE-XR) 500 MG 24 hr tablet TAKE 2 TABLETS BY MOUTH TWO TIMES DAILY 360 tablet 1  . nitroGLYCERIN (NITROSTAT) 0.4 MG SL tablet USE AS DIRECTED AS NEEDED FOR CHEST PAIN 25 tablet 3  . ONE TOUCH ULTRA TEST test strip USE ONE STRIP TO CHECK GLUCOSE 4 TIMES DAILY 400 each 1  . PARoxetine (PAXIL) 20 MG tablet TAKE 1 TABLET BY MOUTH  DAILY 90 tablet 1  . predniSONE (DELTASONE) 20 MG tablet 2 po at same time daily for 5 days 6 tablet 0  . tamsulosin (FLOMAX) 0.4 MG CAPS capsule TAKE 1 CAPSULE BY MOUTH  DAILY 90 capsule 3   No current facility-administered medications for this visit.    Allergies:  Ace inhibitors; Angiotensin receptor blockers; Ciprofloxacin; Other; Oxycodone-acetaminophen; Ramipril; Toradol [ketorolac tromethamine]; Valium; and Doxycycline   Social History: The patient  reports that he quit smoking about 28 years ago. His smoking use included Cigarettes. He started smoking about 53 years ago. He has a 25.00 pack-year smoking history. He quit smokeless tobacco use about 27 years ago. His smokeless tobacco use included Chew. He reports that he drinks alcohol. He reports that he does not use drugs.   ROS:  Please see the history of present illness. Otherwise, complete review of systems is positive for some forgetfulness, mild arthritic symptoms.  All other systems are reviewed and negative.   Physical Exam: VS:  BP 130/72   Pulse 79   Ht '5\' 6"'  (1.676 m)   Wt 218 lb (98.9 kg)   SpO2 97%   BMI 35.19 kg/m , BMI Body mass index is 35.19 kg/m.  Wt Readings from Last 3 Encounters:  09/14/16 218 lb (98.9 kg)  09/10/16 217 lb (98.4 kg)  07/23/16 224 lb (101.6 kg)    General: Overweight male, appears comfortable at rest. HEENT: Conjunctiva and lids normal, oropharynx clear. Neck: Supple, no elevated JVP or carotid bruits, no thyromegaly. Lungs:  Clear to auscultation, nonlabored breathing at rest. Cardiac: Regular rate and rhythm, no S3 or significant systolic murmur, no pericardial rub. Abdomen: Protuberant, nontender, bowel sounds present, no guarding or rebound. Extremities: No pitting edema, distal pulses 2+. Skin: Warm and dry. Musculoskeletal: No kyphosis. Neuropsychiatric: Alert and oriented x3, affect grossly appropriate.  ECG: I personally reviewed the tracing from 03/17/2016 which showed normal sinus rhythm.  Recent Labwork: 07/23/2016: ALT 21; AST 18; BUN 18; Creatinine, Ser 1.01; Potassium 3.8; Sodium 141     Component Value Date/Time   CHOL 122 07/23/2016 0905   CHOL 139 06/02/2012 1007   TRIG 182 (H) 07/23/2016 0905   TRIG 185 (H) 04/10/2013  3267   TRIG 107 06/02/2012 1007   HDL 36 (L) 07/23/2016 0905   HDL 34 (L) 04/10/2013 0937   HDL 40 06/02/2012 1007   CHOLHDL 3.4 07/23/2016 0905   CHOLHDL 6.2 05/14/2011 0550   VLDL UNABLE TO CALCULATE IF TRIGLYCERIDE OVER 400 mg/dL 05/14/2011 0550   LDLCALC 50 07/23/2016 0905   LDLCALC 62 04/10/2013 0937   LDLCALC 78 06/02/2012 1007    Other Studies Reviewed Today:  Lexiscan Myoview 09/17/2015:  No diagnostic ST segment changes to indicate ischemia.  Small, mild intensity, reversible anteroapical defect consistent with a small region of ischemia.  This is a low risk study.  Nuclear stress EF: 61%.  Assessment and Plan:  1. CAD status post BMS to nondominant RCA in 2006 with medically manage moderate disease otherwise. He is doing well without angina on present regimen and had a low risk Myoview study last year. We will continue with observation.  2. Hyperlipidemia, continues on Lipitor with recent LDL 50.  3. Essential hypertension, blood pressure control is adequate today. No changes were made to current medications.  4. Type 2 diabetes mellitus, last hemoglobin A1c 7.3. He is on insulin, Trulicity, and Glucophage with follow-up per PCP.  Current medicines  were reviewed with the patient today.  Disposition: Follow-up in 6 months.  Signed, Satira Sark, MD, [facility] 09/14/2016 8:57 AM    [facility] [place], [place], [place] [address] Phone: [phone]; Fax: [phone]

## 2016-09-14 ENCOUNTER — Encounter: Payer: Self-pay | Admitting: Cardiology

## 2016-09-14 ENCOUNTER — Ambulatory Visit (INDEPENDENT_AMBULATORY_CARE_PROVIDER_SITE_OTHER): Payer: Medicare Other | Admitting: Cardiology

## 2016-09-14 VITALS — BP 130/72 | HR 79 | Ht 66.0 in | Wt 218.0 lb

## 2016-09-14 DIAGNOSIS — I10 Essential (primary) hypertension: Secondary | ICD-10-CM | POA: Diagnosis not present

## 2016-09-14 DIAGNOSIS — I2510 Atherosclerotic heart disease of native coronary artery without angina pectoris: Secondary | ICD-10-CM | POA: Diagnosis not present

## 2016-09-14 DIAGNOSIS — Z794 Long term (current) use of insulin: Secondary | ICD-10-CM

## 2016-09-14 DIAGNOSIS — IMO0002 Reserved for concepts with insufficient information to code with codable children: Secondary | ICD-10-CM

## 2016-09-14 DIAGNOSIS — E1159 Type 2 diabetes mellitus with other circulatory complications: Secondary | ICD-10-CM | POA: Diagnosis not present

## 2016-09-14 DIAGNOSIS — E1165 Type 2 diabetes mellitus with hyperglycemia: Secondary | ICD-10-CM

## 2016-09-14 DIAGNOSIS — E782 Mixed hyperlipidemia: Secondary | ICD-10-CM | POA: Diagnosis not present

## 2016-09-14 NOTE — Patient Instructions (Signed)

## 2016-10-11 ENCOUNTER — Other Ambulatory Visit: Payer: Self-pay | Admitting: Family Medicine

## 2016-10-21 ENCOUNTER — Other Ambulatory Visit: Payer: Self-pay | Admitting: Family Medicine

## 2016-10-26 ENCOUNTER — Encounter: Payer: Self-pay | Admitting: Family Medicine

## 2016-10-26 ENCOUNTER — Ambulatory Visit (INDEPENDENT_AMBULATORY_CARE_PROVIDER_SITE_OTHER): Payer: Medicare Other | Admitting: Family Medicine

## 2016-10-26 VITALS — BP 117/70 | HR 71 | Temp 98.1°F | Ht 66.0 in | Wt 219.0 lb

## 2016-10-26 DIAGNOSIS — Z23 Encounter for immunization: Secondary | ICD-10-CM

## 2016-10-26 DIAGNOSIS — E785 Hyperlipidemia, unspecified: Secondary | ICD-10-CM | POA: Diagnosis not present

## 2016-10-26 DIAGNOSIS — Z794 Long term (current) use of insulin: Secondary | ICD-10-CM | POA: Diagnosis not present

## 2016-10-26 DIAGNOSIS — E1169 Type 2 diabetes mellitus with other specified complication: Secondary | ICD-10-CM

## 2016-10-26 DIAGNOSIS — E1129 Type 2 diabetes mellitus with other diabetic kidney complication: Secondary | ICD-10-CM

## 2016-10-26 DIAGNOSIS — I10 Essential (primary) hypertension: Secondary | ICD-10-CM | POA: Diagnosis not present

## 2016-10-26 DIAGNOSIS — I152 Hypertension secondary to endocrine disorders: Secondary | ICD-10-CM

## 2016-10-26 DIAGNOSIS — R7989 Other specified abnormal findings of blood chemistry: Secondary | ICD-10-CM | POA: Diagnosis not present

## 2016-10-26 DIAGNOSIS — E1159 Type 2 diabetes mellitus with other circulatory complications: Secondary | ICD-10-CM

## 2016-10-26 DIAGNOSIS — R809 Proteinuria, unspecified: Secondary | ICD-10-CM

## 2016-10-26 LAB — BAYER DCA HB A1C WAIVED: HB A1C: 7.1 % — AB (ref <7.0)

## 2016-10-26 MED ORDER — CHLORTHALIDONE 25 MG PO TABS: 12.5000 mg | ORAL_TABLET | Freq: Every day | ORAL | 3 refills | Status: DC

## 2016-10-26 NOTE — Addendum Note (Signed)
Addended by: Michaela Corner on: 10/26/2016 09:50 AM   Modules accepted: Orders

## 2016-10-26 NOTE — Progress Notes (Signed)
BP 117/70   Pulse 71   Temp 98.1 F (36.7 C) (Oral)   Ht 5\' 6"  (1.676 m)   Wt 219 lb (99.3 kg)   BMI 35.35 kg/m    Subjective:    Patient ID: [name], male    DOB: 1940/10/27, 76 y.o.   MRN: [id number]  HPI: [name] is a 76 y.o. male presenting on 10/26/2016 for Diabetes (6 mo follow up); Hyperlipidemia; Hypertension; and Dizziness when standing, lower back pain   HPI Hypertension Patient is currently on chlorthalidone and amlodipine and losartan, and their blood pressure today is 110/70, patient does complain of some orthostatic hypotension at times. He says is been happening more frequently recently. Patient denies headaches, blurred vision, chest pains, shortness of breath, or weakness. Denies any side effects from medication and is content with current medication.   Type 2 diabetes mellitus Patient comes in today for recheck of his diabetes. Patient has been currently taking Trulicity and Lantus and Humalog and metformin, his A1c had been improving and was 7.3 last time, we'll recheck today. We will lower his Lantus down to 48 units at bedtime from 52 because his numbers have been running better, he has been getting 115 to 140 in the a.m. Patient is currently on an ACE inhibitor/ARB. Patient has seen an ophthalmologist this year, he saw Dr. Schuyler Amor in [place]. Patient denies any issues with their feet. Patient does have known microalbuminuria and is currently on an arb.  Hyperlipidemia Patient is coming in for recheck of his hyperlipidemia. The patient is currently taking Lipitor. They deny any issues with myalgias or history of liver damage from it. They deny any focal numbness or weakness or chest pain.   Relevant past medical, surgical, family and social history reviewed and updated as indicated. Interim medical history since our last visit reviewed. Allergies and medications reviewed and updated.  Review of Systems  Constitutional: Negative for chills  and fever.  Respiratory: Negative for shortness of breath and wheezing.   Cardiovascular: Negative for chest pain and leg swelling.  Musculoskeletal: Negative for back pain and gait problem.  Skin: Negative for rash.  Neurological: Positive for dizziness and light-headedness. Negative for speech difficulty, weakness, numbness and headaches.  All other systems reviewed and are negative.   Per HPI unless specifically indicated above        Objective:    BP 117/70   Pulse 71   Temp 98.1 F (36.7 C) (Oral)   Ht 5\' 6"  (1.676 m)   Wt 219 lb (99.3 kg)   BMI 35.35 kg/m   Wt Readings from Last 3 Encounters:  10/26/16 219 lb (99.3 kg)  09/14/16 218 lb (98.9 kg)  09/10/16 217 lb (98.4 kg)    Physical Exam  Constitutional: He is oriented to person, place, and time. He appears well-developed and well-nourished. No distress.  Eyes: Conjunctivae are normal. No scleral icterus.  Neck: Neck supple. No thyromegaly present.  Cardiovascular: Normal rate, regular rhythm, normal heart sounds and intact distal pulses.   No murmur heard. Pulmonary/Chest: Effort normal and breath sounds normal. No respiratory distress. He has no wheezes. He has no rales.  Musculoskeletal: Normal range of motion. He exhibits no edema.  Lymphadenopathy:    He has no cervical adenopathy.  Neurological: He is alert and oriented to person, place, and time. Coordination normal.  Skin: Skin is warm and dry. No rash noted. He is not diaphoretic.  Psychiatric: He has a normal mood and affect.  His behavior is normal.  Nursing note and vitals reviewed.       Assessment & Plan:   Problem List Items Addressed This Visit      Cardiovascular and Mediastinum   Hypertension associated with diabetes ([place])   Relevant Medications   chlorthalidone (HYGROTON) 25 MG tablet   Type 2 diabetes mellitus with circulatory disorder ([facility]) - Primary   Relevant Medications   chlorthalidone (HYGROTON) 25 MG tablet   Other Relevant  Orders   Bayer DCA Hb A1c Waived     Endocrine   Hyperlipidemia associated with type 2 diabetes mellitus ([facility])   Relevant Medications   chlorthalidone (HYGROTON) 25 MG tablet   Microalbuminuria due to type 2 diabetes mellitus ([place])    Other Visit Diagnoses    Low vitamin D level       Relevant Orders   VITAMIN D 25 Hydroxy (Vit-D Deficiency, Fractures)      Patient has been having some orthostatic hypotension and we will lower his chlorthalidone to 12.5 mg  Follow up plan: Return in about 3 months (around 01/26/2017), or if symptoms worsen or fail to improve, for Diabetes and hypertension recheck.  Counseling provided for all of the vaccine components Orders Placed This Encounter  Procedures  . Bayer DCA Hb A1c Waived  . VITAMIN D 25 Hydroxy (Vit-D Deficiency, Fractures)    Caryl Pina, MD [facility] 10/26/2016, 8:52 AM

## 2016-10-27 LAB — VITAMIN D 25 HYDROXY (VIT D DEFICIENCY, FRACTURES): Vit D, 25-Hydroxy: 44.6 ng/mL (ref 30.0–100.0)

## 2016-10-29 ENCOUNTER — Telehealth: Payer: Self-pay | Admitting: Family Medicine

## 2016-10-29 ENCOUNTER — Encounter: Payer: Self-pay | Admitting: Family Medicine

## 2016-10-29 NOTE — Telephone Encounter (Signed)
Pt asking about 2nd Shingrix vaccine Pt informed he may come in and get vaccine Pt wants to wait until 3 mth follow up Will get at next appointment

## 2017-01-10 ENCOUNTER — Telehealth: Payer: Self-pay | Admitting: Cardiology

## 2017-01-10 NOTE — Telephone Encounter (Signed)
Patient notified and verbalized understanding.  6 mo recall scheduled for 03/14/2017.

## 2017-01-10 NOTE — Telephone Encounter (Signed)
Based on what he is describing, symptoms could also be due to lumbar disc disease or even spinal stenosis. Go ahead and stay off Lipitor for now to see if symptoms continue to improve.

## 2017-01-10 NOTE — Telephone Encounter (Signed)
Patient stated for about a couple months now, pain in coccyx / lower buttock area and hips.  Stated that pain gets better when he sits down, worse with standing.  Also, notices a lot more first thing in am after getting out of bed.  Stated that he stopped Lipitor on his own x 6 days ago, feels a little better but still has the pain.  Patient does already have an OV scheduled with his pmd for 01/27/2017.

## 2017-01-10 NOTE — Telephone Encounter (Signed)
Stopped taking his Lipitor 5 days ago due to thinking it was causing his lower back and hip to hurt.  Said it helped some but pain is still there. Wants to speak with someone in reference to what could be causing pain and if he should have stopped his medication.

## 2017-01-10 NOTE — Telephone Encounter (Signed)
Left message to return call 

## 2017-01-12 ENCOUNTER — Other Ambulatory Visit: Payer: Self-pay | Admitting: Family Medicine

## 2017-01-13 DIAGNOSIS — R51 Headache: Secondary | ICD-10-CM | POA: Diagnosis not present

## 2017-01-13 DIAGNOSIS — M5136 Other intervertebral disc degeneration, lumbar region: Secondary | ICD-10-CM | POA: Diagnosis not present

## 2017-01-13 DIAGNOSIS — Z794 Long term (current) use of insulin: Secondary | ICD-10-CM | POA: Diagnosis not present

## 2017-01-13 DIAGNOSIS — Z79899 Other long term (current) drug therapy: Secondary | ICD-10-CM | POA: Diagnosis not present

## 2017-01-13 DIAGNOSIS — Z7982 Long term (current) use of aspirin: Secondary | ICD-10-CM | POA: Diagnosis not present

## 2017-01-13 DIAGNOSIS — M47816 Spondylosis without myelopathy or radiculopathy, lumbar region: Secondary | ICD-10-CM | POA: Diagnosis not present

## 2017-01-17 ENCOUNTER — Telehealth: Payer: Self-pay | Admitting: Family Medicine

## 2017-01-17 ENCOUNTER — Encounter: Payer: Self-pay | Admitting: Physician Assistant

## 2017-01-17 ENCOUNTER — Ambulatory Visit (INDEPENDENT_AMBULATORY_CARE_PROVIDER_SITE_OTHER): Payer: Medicare Other | Admitting: Physician Assistant

## 2017-01-17 ENCOUNTER — Other Ambulatory Visit: Payer: Self-pay | Admitting: Physician Assistant

## 2017-01-17 VITALS — BP 135/72 | HR 75 | Ht 66.0 in | Wt 220.0 lb

## 2017-01-17 DIAGNOSIS — M545 Low back pain, unspecified: Secondary | ICD-10-CM | POA: Insufficient documentation

## 2017-01-17 DIAGNOSIS — G43809 Other migraine, not intractable, without status migrainosus: Secondary | ICD-10-CM

## 2017-01-17 DIAGNOSIS — M5416 Radiculopathy, lumbar region: Secondary | ICD-10-CM | POA: Insufficient documentation

## 2017-01-17 MED ORDER — BUTALBITAL-APAP-CAFFEINE 50-325-40 MG PO TABS: 1.0000 | ORAL_TABLET | Freq: Two times a day (BID) | ORAL | 0 refills | Status: DC | PRN

## 2017-01-17 MED ORDER — KETOROLAC TROMETHAMINE 60 MG/2ML IM SOLN: 60.0000 mg | Freq: Once | INTRAMUSCULAR | Status: DC

## 2017-01-17 MED ORDER — HYDROCODONE-ACETAMINOPHEN 10-325 MG PO TABS: 1.0000 | ORAL_TABLET | Freq: Three times a day (TID) | ORAL | 0 refills | Status: DC | PRN

## 2017-01-17 NOTE — Progress Notes (Signed)
BP 135/72   Pulse 75   Ht _0  (1.676 m)   Wt 220 lb (99.8 kg)   BMI 35.51 kg/m    Subjective:    Patient ID: [name], male    DOB: 01-Jun-1940, 77 y.o.   MRN: [id number]  HPI: [name] is a 77 y.o. male presenting on 01/17/2017 for Back Pain (low ) A couple of injections.  He is allergic to Toradol.  He states it does not have much help.  He continues with significant pain over both cheeks and down the left leg.  It goes all the way down to the posterior knee.  He has a lot of pain whenever he changes positions and walks.  He does have known degenerative disc problems.  We will talk with his primary care provider about further evaluation at this  Patient also would like refills on some medications for his migraine.  We will send a refill in.  Relevant past medical, surgical, family and social history reviewed and updated as indicated. Allergies and medications reviewed and updated.  Past Medical History:  Diagnosis Date  . Acute encephalopathy 08/2010  . Adenomatous polyp   . Anxiety disorder   . Arthritis   . Chronic headaches   . Common migraine 12/23/2014  . Coronary atherosclerosis of native coronary artery    BMS nondominant RCA 12/2004  . Diverticulosis   . Essential hypertension, benign   . Gallbladder cholesterolosis   . GERD (gastroesophageal reflux disease)   . Hyperlipidemia   . Internal hemorrhoids   . Myocardial infarction ([place])    2006/1 stent  . Nephrolithiasis   . Shoulder pain, right   . Type 2 diabetes mellitus ([place])     Past Surgical History:  Procedure Laterality Date  . CATARACT EXTRACTION    . CHOLECYSTECTOMY    . COLONOSCOPY  09/10/2008   normal  . CYSTOSCOPY W/ URETEROSCOPY W/ LITHOTRIPSY  08/2010  . JOINT REPLACEMENT     right hip  . LEFT HEART CATHETERIZATION WITH CORONARY ANGIOGRAM N/A 05/14/2011   Procedure: LEFT HEART CATHETERIZATION WITH CORONARY ANGIOGRAM;  Surgeon: Sherren Mocha, MD;  Location: [facility] CATH LAB;  Service:  Cardiovascular;  Laterality: N/A;  . ROTATOR CUFF REPAIR     right  . ROTATOR CUFF REPAIR Left 12/11/2015  . TOTAL KNEE ARTHROPLASTY     Bil  . UPPER GASTROINTESTINAL ENDOSCOPY  01/08/2008   bx, inlet patch, duodenitis  . YAG LASER APPLICATION Right 04/19/8117   Procedure: YAG LASER APPLICATION;  Surgeon: Williams Che, MD;  Location: [facility];  Service: Ophthalmology;  Laterality: Right;    Review of Systems  Constitutional: Negative.  Negative for appetite change and fatigue.  HENT: Negative.   Eyes: Negative.  Negative for pain and visual disturbance.  Respiratory: Negative.  Negative for cough, chest tightness, shortness of breath and wheezing.   Cardiovascular: Negative.  Negative for chest pain, palpitations and leg swelling.  Gastrointestinal: Negative.  Negative for abdominal pain, diarrhea, nausea and vomiting.  Endocrine: Negative.   Genitourinary: Negative.   Musculoskeletal: Positive for back pain and gait problem.  Skin: Negative.  Negative for color change and rash.  Neurological: Positive for headaches. Negative for weakness and numbness.  Psychiatric/Behavioral: Negative.     Allergies as of 01/17/2017      Reactions   Ace Inhibitors Hives, Swelling, Rash   Rash,hives,tongue swelling   Angiotensin Receptor Blockers    Ciprofloxacin Other (See Comments)   confusion  Other Hives, Other (See Comments)   altaseptic   Oxycodone-acetaminophen Other (See Comments)   REACTION: unknown reaction   Ramipril Cough   Toradol [ketorolac Tromethamine]    confusion   Valium Other (See Comments)   Hallucinations; confusion   Doxycycline Hives, Rash      Medication List        Accurate as of 01/17/17 10:39 PM. Always use your most recent med list.          acetaminophen 650 MG CR tablet Commonly known as:  TYLENOL Take 650 mg by mouth every 8 (eight) hours as needed for pain.   amLODipine 5 MG tablet Commonly known as:  NORVASC TAKE 1 TABLET BY MOUTH  DAILY     aspirin 81 MG tablet Take 81 mg by mouth every morning.   atorvastatin 20 MG tablet Commonly known as:  LIPITOR TAKE 1 TABLET BY MOUTH  DAILY AT 6 PM.   butalbital-acetaminophen-caffeine 50-325-40 MG tablet Commonly known as:  FIORICET, ESGIC Take 1 tablet by mouth 2 (two) times daily as needed for headache.   cetirizine 10 MG tablet Commonly known as:  ZYRTEC Take 10 mg by mouth every evening.   chlorthalidone 25 MG tablet Commonly known as:  HYGROTON Take 0.5 tablets (12.5 mg total) by mouth daily. as directed   Dulaglutide 1.5 MG/0.5ML Sopn Commonly known as:  TRULICITY Inject 1.5 mg into the skin once a week.   famotidine 20 MG tablet Commonly known as:  PEPCID TAKE 1 TABLET BY MOUTH TWO  TIMES DAILY   HUMALOG KWIKPEN 100 UNIT/ML KiwkPen Generic drug:  insulin lispro INJECT 0 TO 15 UNITS  SUBCUTANEOUSLY 3 TIMES  DAILY BEFORE MEALS  (70-150=8 UNITS, 151-200=9  UNITS)   HYDROcodone-acetaminophen 10-325 MG tablet Commonly known as:  NORCO Take 1 tablet by mouth every 8 (eight) hours as needed.   Insulin Pen Needle 31G X 8 MM Misc Commonly known as:  B-D ULTRAFINE III SHORT PEN USE 4 TIMES DAILY DX E11.49   LANTUS SOLOSTAR 100 UNIT/ML Solostar Pen Generic drug:  Insulin Glargine INJECT SUBCUTANEOUSLY 52  UNITS AT BEDTIME   losartan 50 MG tablet Commonly known as:  COZAAR TAKE 1 TABLET BY MOUTH  DAILY   metFORMIN 500 MG 24 hr tablet Commonly known as:  GLUCOPHAGE-XR TAKE 2 TABLETS BY MOUTH TWO TIMES DAILY   nitroGLYCERIN 0.4 MG SL tablet Commonly known as:  NITROSTAT USE AS DIRECTED AS NEEDED FOR CHEST PAIN   ONE TOUCH ULTRA SYSTEM KIT w/Device Kit 1 kit by Does not apply route once.   ONE TOUCH ULTRA TEST test strip Generic drug:  glucose blood USE ONE STRIP TO CHECK GLUCOSE 4 TIMES DAILY   PARoxetine 20 MG tablet Commonly known as:  PAXIL TAKE 1 TABLET BY MOUTH  DAILY   tamsulosin 0.4 MG Caps capsule Commonly known as:  FLOMAX TAKE 1 CAPSULE BY  MOUTH  DAILY   Turmeric 500 MG Caps Take 500 mg by mouth at bedtime.   Vitamin D3 2000 units Tabs Take 1 tablet by mouth every morning.          Objective:    BP 135/72   Pulse 75   Ht _0  (1.676 m)   Wt 220 lb (99.8 kg)   BMI 35.51 kg/m   Allergies  Allergen Reactions  . Ace Inhibitors Hives, Swelling and Rash    Rash,hives,tongue swelling  . Angiotensin Receptor Blockers   . Ciprofloxacin Other (See Comments)    confusion  .  Other Hives and Other (See Comments)    altaseptic  . Oxycodone-Acetaminophen Other (See Comments)    REACTION: unknown reaction  . Ramipril Cough  . Toradol [Ketorolac Tromethamine]     confusion  . Valium Other (See Comments)    Hallucinations; confusion  . Doxycycline Hives and Rash    Physical Exam  Constitutional: He appears well-developed and well-nourished. No distress.  HENT:  Head: Normocephalic and atraumatic.  Eyes: Conjunctivae and EOM are normal. Pupils are equal, round, and reactive to light.  Cardiovascular: Normal rate, regular rhythm and normal heart sounds.  Pulmonary/Chest: Effort normal and breath sounds normal. No respiratory distress.  Musculoskeletal:       Lumbar back: He exhibits decreased range of motion, tenderness, pain and spasm.       Back:  Skin: Skin is warm and dry.  Psychiatric: He has a normal mood and affect. His behavior is normal.  Nursing note and vitals reviewed.       Assessment & Plan:   1. Lumbar radicular pain Patient was seen last week through the [facility] emergency room and given - HYDROcodone-acetaminophen (NORCO) 10-325 MG tablet; Take 1 tablet by mouth every 8 (eight) hours as needed.  Dispense: 30 tablet; Refill: 0  2. Lumbar pain - HYDROcodone-acetaminophen (NORCO) 10-325 MG tablet; Take 1 tablet by mouth every 8 (eight) hours as needed.  Dispense: 30 tablet; Refill: 0  3. Other migraine without status migrainosus, not intractable - butalbital-acetaminophen-caffeine (FIORICET,  ESGIC) 50-325-40 MG tablet; Take 1 tablet by mouth 2 (two) times daily as needed for headache.  Dispense: 14 tablet; Refill: 0    Current Outpatient Medications:  .  acetaminophen (TYLENOL) 650 MG CR tablet, Take 650 mg by mouth every 8 (eight) hours as needed for pain., Disp: , Rfl:  .  amLODipine (NORVASC) 5 MG tablet, TAKE 1 TABLET BY MOUTH  DAILY, Disp: 90 tablet, Rfl: 0 .  aspirin 81 MG tablet, Take 81 mg by mouth every morning. , Disp: , Rfl:  .  atorvastatin (LIPITOR) 20 MG tablet, TAKE 1 TABLET BY MOUTH  DAILY AT 6 PM., Disp: 90 tablet, Rfl: 1 .  Blood Glucose Monitoring Suppl (ONE TOUCH ULTRA SYSTEM KIT) W/DEVICE KIT, 1 kit by Does not apply route once., Disp: 1 each, Rfl: 0 .  butalbital-acetaminophen-caffeine (FIORICET, ESGIC) 50-325-40 MG tablet, Take 1 tablet by mouth 2 (two) times daily as needed for headache., Disp: 14 tablet, Rfl: 0 .  cetirizine (ZYRTEC) 10 MG tablet, Take 10 mg by mouth every evening. , Disp: , Rfl:  .  chlorthalidone (HYGROTON) 25 MG tablet, Take 0.5 tablets (12.5 mg total) by mouth daily. as directed, Disp: 45 tablet, Rfl: 3 .  Cholecalciferol (VITAMIN D3) 2000 UNITS TABS, Take 1 tablet by mouth every morning. , Disp: , Rfl:  .  Dulaglutide (TRULICITY) 1.5 OX/7.3ZH SOPN, Inject 1.5 mg into the skin once a week., Disp: 4 pen, Rfl: 2 .  famotidine (PEPCID) 20 MG tablet, TAKE 1 TABLET BY MOUTH TWO  TIMES DAILY, Disp: 180 tablet, Rfl: 1 .  HUMALOG KWIKPEN 100 UNIT/ML KiwkPen, INJECT 0 TO 15 UNITS  SUBCUTANEOUSLY 3 TIMES  DAILY BEFORE MEALS  (70-150=8 UNITS, 151-200=9  UNITS) (Patient taking differently: inject 10 units before each meal), Disp: 45 mL, Rfl: 2 .  HYDROcodone-acetaminophen (NORCO) 10-325 MG tablet, Take 1 tablet by mouth every 8 (eight) hours as needed., Disp: 30 tablet, Rfl: 0 .  Insulin Pen Needle (B-D ULTRAFINE III SHORT PEN) 31G X 8 MM  MISC, USE 4 TIMES DAILY DX E11.49, Disp: 400 each, Rfl: 1 .  LANTUS SOLOSTAR 100 UNIT/ML Solostar Pen, INJECT  SUBCUTANEOUSLY 52  UNITS AT BEDTIME, Disp: 60 mL, Rfl: 0 .  losartan (COZAAR) 50 MG tablet, TAKE 1 TABLET BY MOUTH  DAILY, Disp: 90 tablet, Rfl: 0 .  metFORMIN (GLUCOPHAGE-XR) 500 MG 24 hr tablet, TAKE 2 TABLETS BY MOUTH TWO TIMES DAILY, Disp: 360 tablet, Rfl: 0 .  nitroGLYCERIN (NITROSTAT) 0.4 MG SL tablet, USE AS DIRECTED AS NEEDED FOR CHEST PAIN, Disp: 25 tablet, Rfl: 3 .  ONE TOUCH ULTRA TEST test strip, USE ONE STRIP TO CHECK GLUCOSE 4 TIMES DAILY, Disp: 400 each, Rfl: 1 .  PARoxetine (PAXIL) 20 MG tablet, TAKE 1 TABLET BY MOUTH  DAILY, Disp: 90 tablet, Rfl: 1 .  tamsulosin (FLOMAX) 0.4 MG CAPS capsule, TAKE 1 CAPSULE BY MOUTH  DAILY, Disp: 90 capsule, Rfl: 3 .  Turmeric 500 MG CAPS, Take 500 mg by mouth at bedtime., Disp: , Rfl:  Continue all other maintenance medications as listed above.  Follow up plan: Return for keep follow up with Dr. Warrick Parisian.  Educational handout given for [place] PA-C [facility] [address]  [place], [place] [address] [phone]   01/17/2017, 10:39 PM

## 2017-01-17 NOTE — Telephone Encounter (Signed)
Please advise 

## 2017-01-17 NOTE — Telephone Encounter (Signed)
I printed and signed and heard Abigail Butts give it to him with his AVS

## 2017-01-17 NOTE — Patient Instructions (Signed)
In a few days you may receive a survey in the mail or online from [employer] regarding your visit with us today. Please take a moment to fill this out. Your feedback is very important to our whole office. It can help us better understand your needs as well as improve your experience and satisfaction. Thank you for taking your time to complete it. We care about you.  Jayd Forrey, PA-C  

## 2017-01-18 NOTE — Telephone Encounter (Signed)
Pt notified of RX Verbalizes understanding 

## 2017-01-18 NOTE — Telephone Encounter (Signed)
Patient wife aware that rx was given to patient.

## 2017-01-21 LAB — PULMONARY FUNCTION TEST

## 2017-01-24 DIAGNOSIS — M48061 Spinal stenosis, lumbar region without neurogenic claudication: Secondary | ICD-10-CM | POA: Diagnosis not present

## 2017-01-24 DIAGNOSIS — M545 Low back pain: Secondary | ICD-10-CM | POA: Diagnosis not present

## 2017-01-27 ENCOUNTER — Ambulatory Visit (INDEPENDENT_AMBULATORY_CARE_PROVIDER_SITE_OTHER): Payer: Medicare Other | Admitting: Family Medicine

## 2017-01-27 ENCOUNTER — Encounter: Payer: Self-pay | Admitting: Family Medicine

## 2017-01-27 VITALS — BP 138/70 | HR 49 | Temp 97.2°F | Ht 66.0 in | Wt 218.0 lb

## 2017-01-27 DIAGNOSIS — Z794 Long term (current) use of insulin: Secondary | ICD-10-CM | POA: Diagnosis not present

## 2017-01-27 DIAGNOSIS — I10 Essential (primary) hypertension: Secondary | ICD-10-CM | POA: Diagnosis not present

## 2017-01-27 DIAGNOSIS — R809 Proteinuria, unspecified: Secondary | ICD-10-CM

## 2017-01-27 DIAGNOSIS — Z125 Encounter for screening for malignant neoplasm of prostate: Secondary | ICD-10-CM | POA: Diagnosis not present

## 2017-01-27 DIAGNOSIS — Z23 Encounter for immunization: Secondary | ICD-10-CM

## 2017-01-27 DIAGNOSIS — E1159 Type 2 diabetes mellitus with other circulatory complications: Secondary | ICD-10-CM

## 2017-01-27 DIAGNOSIS — E785 Hyperlipidemia, unspecified: Secondary | ICD-10-CM

## 2017-01-27 DIAGNOSIS — E1169 Type 2 diabetes mellitus with other specified complication: Secondary | ICD-10-CM | POA: Diagnosis not present

## 2017-01-27 DIAGNOSIS — E1129 Type 2 diabetes mellitus with other diabetic kidney complication: Secondary | ICD-10-CM

## 2017-01-27 DIAGNOSIS — K219 Gastro-esophageal reflux disease without esophagitis: Secondary | ICD-10-CM

## 2017-01-27 DIAGNOSIS — I152 Hypertension secondary to endocrine disorders: Secondary | ICD-10-CM

## 2017-01-27 LAB — BAYER DCA HB A1C WAIVED: HB A1C: 7.4 % — AB (ref <7.0)

## 2017-01-27 MED ORDER — DULAGLUTIDE 1.5 MG/0.5ML ~~LOC~~ SOAJ: 1.5000 mg | SUBCUTANEOUS | 2 refills | Status: DC

## 2017-01-27 NOTE — Progress Notes (Signed)
BP 138/70   Pulse (!) 49   Temp (!) 97.2 F (36.2 C) (Oral)   Ht '5\' 6"'  (1.676 m)   Wt 218 lb (98.9 kg)   BMI 35.19 kg/m    Subjective:    Patient ID: [name], male    DOB: 09-Jun-1940, 77 y.o.   MRN: [id number]  HPI: [name] is a 77 y.o. male presenting on 01/27/2017 for Diabetes (3 month follow up); Hypertension; and Hyperlipidemia   HPI Type 2 diabetes mellitus Patient comes in today for recheck of his diabetes. Patient has been currently taking metformin and Lantus 48 and Humalog 10 -3 times daily with meals. Patient is currently on an ACE inhibitor/ARB. Patient has not seen an ophthalmologist this year. Patient denies any issues with their feet.   Hypertension Patient is currently on amlodipine and losartan and chlorthalidone, and their blood pressure today is 138/70. Patient denies any lightheadedness or dizziness. Patient denies headaches, blurred vision, chest pains, shortness of breath, or weakness. Denies any side effects from medication and is content with current medication.   Hyperlipidemia Patient is coming in for recheck of his hyperlipidemia. The patient is currently taking atorvastatin. They deny any issues with myalgias or history of liver damage from it. They deny any focal numbness or weakness or chest pain.   GERD Patient is currently on famotidine.  She denies any major symptoms or abdominal pain or belching or burping. She denies any blood in her stool or lightheadedness or dizziness.   Relevant past medical, surgical, family and social history reviewed and updated as indicated. Interim medical history since our last visit reviewed. Allergies and medications reviewed and updated.  Review of Systems  Constitutional: Negative for chills and fever.  Respiratory: Negative for shortness of breath and wheezing.   Cardiovascular: Negative for chest pain and leg swelling.  Musculoskeletal: Negative for back pain and gait problem.  Skin:  Negative for rash.  Neurological: Negative for dizziness, weakness, light-headedness, numbness and headaches.  All other systems reviewed and are negative.   Per HPI unless specifically indicated above   Allergies as of 01/27/2017      Reactions   Ace Inhibitors Hives, Swelling, Rash   Rash,hives,tongue swelling   Angiotensin Receptor Blockers    Ciprofloxacin Other (See Comments)   confusion   Other Hives, Other (See Comments)   altaseptic   Oxycodone-acetaminophen Other (See Comments)   REACTION: unknown reaction   Ramipril Cough   Toradol [ketorolac Tromethamine]    confusion   Valium Other (See Comments)   Hallucinations; confusion   Doxycycline Hives, Rash      Medication List        Accurate as of 01/27/17  8:31 AM. Always use your most recent med list.          acetaminophen 650 MG CR tablet Commonly known as:  TYLENOL Take 650 mg by mouth every 8 (eight) hours as needed for pain.   amLODipine 5 MG tablet Commonly known as:  NORVASC TAKE 1 TABLET BY MOUTH  DAILY   aspirin 81 MG tablet Take 81 mg by mouth every morning.   atorvastatin 20 MG tablet Commonly known as:  LIPITOR TAKE 1 TABLET BY MOUTH  DAILY AT 6 PM.   butalbital-acetaminophen-caffeine 50-325-40 MG tablet Commonly known as:  FIORICET, ESGIC Take 1 tablet by mouth 2 (two) times daily as needed for headache.   cetirizine 10 MG tablet Commonly known as:  ZYRTEC Take 10 mg by mouth every  evening.   chlorthalidone 25 MG tablet Commonly known as:  HYGROTON Take 0.5 tablets (12.5 mg total) by mouth daily. as directed   Dulaglutide 1.5 MG/0.5ML Sopn Commonly known as:  TRULICITY Inject 1.5 mg into the skin once a week.   famotidine 20 MG tablet Commonly known as:  PEPCID TAKE 1 TABLET BY MOUTH TWO  TIMES DAILY   HUMALOG KWIKPEN 100 UNIT/ML KiwkPen Generic drug:  insulin lispro INJECT 0 TO 15 UNITS  SUBCUTANEOUSLY 3 TIMES  DAILY BEFORE MEALS  (70-150=8 UNITS, 151-200=9  UNITS)     HYDROcodone-acetaminophen 10-325 MG tablet Commonly known as:  NORCO Take 1 tablet by mouth every 8 (eight) hours as needed.   Insulin Pen Needle 31G X 8 MM Misc Commonly known as:  B-D ULTRAFINE III SHORT PEN USE 4 TIMES DAILY DX E11.49   LANTUS SOLOSTAR 100 UNIT/ML Solostar Pen Generic drug:  Insulin Glargine INJECT SUBCUTANEOUSLY 52  UNITS AT BEDTIME   losartan 50 MG tablet Commonly known as:  COZAAR TAKE 1 TABLET BY MOUTH  DAILY   metFORMIN 500 MG 24 hr tablet Commonly known as:  GLUCOPHAGE-XR TAKE 2 TABLETS BY MOUTH TWO TIMES DAILY   nitroGLYCERIN 0.4 MG SL tablet Commonly known as:  NITROSTAT USE AS DIRECTED AS NEEDED FOR CHEST PAIN   ONE TOUCH ULTRA SYSTEM KIT w/Device Kit 1 kit by Does not apply route once.   ONE TOUCH ULTRA TEST test strip Generic drug:  glucose blood USE ONE STRIP TO CHECK GLUCOSE 4 TIMES DAILY   PARoxetine 20 MG tablet Commonly known as:  PAXIL TAKE 1 TABLET BY MOUTH  DAILY   tamsulosin 0.4 MG Caps capsule Commonly known as:  FLOMAX TAKE 1 CAPSULE BY MOUTH  DAILY   Turmeric 500 MG Caps Take 500 mg by mouth at bedtime.   Vitamin D3 2000 units Tabs Take 1 tablet by mouth every morning.          Objective:    BP 138/70   Pulse (!) 49   Temp (!) 97.2 F (36.2 C) (Oral)   Ht '5\' 6"'  (1.676 m)   Wt 218 lb (98.9 kg)   BMI 35.19 kg/m   Wt Readings from Last 3 Encounters:  01/27/17 218 lb (98.9 kg)  01/17/17 220 lb (99.8 kg)  10/26/16 219 lb (99.3 kg)    Physical Exam  Constitutional: He is oriented to person, place, and time. He appears well-developed and well-nourished. No distress.  Eyes: Conjunctivae are normal. No scleral icterus.  Neck: Neck supple. No thyromegaly present.  Cardiovascular: Normal rate, regular rhythm, normal heart sounds and intact distal pulses.  No murmur heard. Pulmonary/Chest: Effort normal and breath sounds normal. No respiratory distress. He has no wheezes. He has no rales.  Musculoskeletal: Normal  range of motion. He exhibits no edema.  Lymphadenopathy:    He has no cervical adenopathy.  Neurological: He is alert and oriented to person, place, and time. Coordination normal.  Skin: Skin is warm and dry. No rash noted. He is not diaphoretic.  Psychiatric: He has a normal mood and affect. His behavior is normal.  Nursing note and vitals reviewed.   Diabetic Foot Exam - Simple   Simple Foot Form Diabetic Foot exam was performed with the following findings:  Yes 01/27/2017  8:40 AM  Visual Inspection No deformities, no ulcerations, no other skin breakdown bilaterally:  Yes Sensation Testing Intact to touch and monofilament testing bilaterally:  Yes Pulse Check Posterior Tibialis and Dorsalis pulse intact bilaterally:  Yes Comments        Assessment & Plan:   Problem List Items Addressed This Visit      Cardiovascular and Mediastinum   Hypertension associated with diabetes ([place])   Relevant Medications   Dulaglutide (TRULICITY) 1.5 SN/0.5LZ SOPN   Type 2 diabetes mellitus with circulatory disorder (HCC) - Primary   Relevant Medications   Dulaglutide (TRULICITY) 1.5 JQ/7.3AL SOPN   Other Relevant Orders   Bayer DCA Hb A1c Waived   CMP14+EGFR     Digestive   GERD (gastroesophageal reflux disease)     Endocrine   Hyperlipidemia associated with type 2 diabetes mellitus (HCC)   Relevant Medications   Dulaglutide (TRULICITY) 1.5 PF/7.9KW SOPN   Other Relevant Orders   Lipid panel   Microalbuminuria due to type 2 diabetes mellitus ([place])   Relevant Medications   Dulaglutide (TRULICITY) 1.5 IO/9.7DZ SOPN   Other Relevant Orders   CMP14+EGFR    Other Visit Diagnoses    Prostate cancer screening       Relevant Orders   PSA, total and free       Follow up plan: Return in about 3 months (around 04/27/2017), or if symptoms worsen or fail to improve, for Diabetes and hypertension recheck.  Counseling provided for all of the vaccine components Orders Placed This Encounter   Procedures  . Bayer DCA Hb A1c Waived  . CMP14+EGFR  . Lipid panel  . PSA, total and free    Caryl Pina, MD [facility] 01/27/2017, 8:31 AM

## 2017-01-28 DIAGNOSIS — M545 Low back pain: Secondary | ICD-10-CM | POA: Diagnosis not present

## 2017-01-28 LAB — CMP14+EGFR
A/G RATIO: 1.5 (ref 1.2–2.2)
ALT: 18 IU/L (ref 0–44)
AST: 14 IU/L (ref 0–40)
Albumin: 4.4 g/dL (ref 3.5–4.8)
Alkaline Phosphatase: 63 IU/L (ref 39–117)
BUN/Creatinine Ratio: 20 (ref 10–24)
BUN: 21 mg/dL (ref 8–27)
Bilirubin Total: 0.4 mg/dL (ref 0.0–1.2)
CALCIUM: 9.8 mg/dL (ref 8.6–10.2)
CO2: 25 mmol/L (ref 20–29)
CREATININE: 1.03 mg/dL (ref 0.76–1.27)
Chloride: 99 mmol/L (ref 96–106)
GFR, EST AFRICAN AMERICAN: 81 mL/min/{1.73_m2} (ref >59)
GFR, EST NON AFRICAN AMERICAN: 70 mL/min/{1.73_m2} (ref >59)
GLUCOSE: 204 mg/dL — AB (ref 65–99)
Globulin, Total: 3 g/dL (ref 1.5–4.5)
POTASSIUM: 5.6 mmol/L — AB (ref 3.5–5.2)
Sodium: 143 mmol/L (ref 134–144)
TOTAL PROTEIN: 7.4 g/dL (ref 6.0–8.5)

## 2017-01-28 LAB — PSA, TOTAL AND FREE
PROSTATE SPECIFIC AG, SERUM: 1.6 ng/mL (ref 0.0–4.0)
PSA, Free Pct: 22.5 %
PSA, Free: 0.36 ng/mL

## 2017-01-28 LAB — LIPID PANEL
CHOL/HDL RATIO: 3.1 ratio (ref 0.0–5.0)
Cholesterol, Total: 131 mg/dL (ref 100–199)
HDL: 42 mg/dL (ref >39)
LDL Calculated: 74 mg/dL (ref 0–99)
TRIGLYCERIDES: 74 mg/dL (ref 0–149)
VLDL CHOLESTEROL CAL: 15 mg/dL (ref 5–40)

## 2017-01-31 DIAGNOSIS — M48061 Spinal stenosis, lumbar region without neurogenic claudication: Secondary | ICD-10-CM | POA: Diagnosis not present

## 2017-01-31 DIAGNOSIS — M545 Low back pain: Secondary | ICD-10-CM | POA: Diagnosis not present

## 2017-02-02 ENCOUNTER — Telehealth: Payer: Self-pay | Admitting: Family Medicine

## 2017-02-02 NOTE — Telephone Encounter (Signed)
Per nurse form was faxed the day the patients wife brought in to office and they received confirmation. Notified patient. Verbalizes understanding

## 2017-02-03 ENCOUNTER — Other Ambulatory Visit: Payer: Self-pay | Admitting: Cardiology

## 2017-02-03 ENCOUNTER — Telehealth: Payer: Self-pay | Admitting: Cardiology

## 2017-02-03 NOTE — Telephone Encounter (Signed)
Has Dr Susa Day offfice sent office a clearance request for him to have back surgery

## 2017-02-03 NOTE — Telephone Encounter (Signed)
Patient notified form was received. Dr. Domenic Polite will review in office tomorrow.

## 2017-02-08 ENCOUNTER — Other Ambulatory Visit: Payer: Self-pay | Admitting: Family Medicine

## 2017-02-08 NOTE — Progress Notes (Signed)
Please place orders in Epic as patient is being scheduled for a pre-op appointment! Thank you! 

## 2017-02-10 ENCOUNTER — Other Ambulatory Visit: Payer: Self-pay | Admitting: *Deleted

## 2017-02-10 ENCOUNTER — Ambulatory Visit: Payer: Self-pay | Admitting: Orthopedic Surgery

## 2017-02-10 MED ORDER — ONETOUCH ULTRA BLUE VI STRP: ORAL_STRIP | 1 refills | Status: DC

## 2017-02-11 ENCOUNTER — Ambulatory Visit: Payer: Self-pay | Admitting: Orthopedic Surgery

## 2017-02-11 NOTE — H&P (Signed)
[name] is an 77 y.o. male.   Chief Complaint: back and left leg pain HPI: Patient reports lower back pain __ and leg pain on the left, __, __. He reports (normal) review of test results (lumbar spine). He reports pain level 9/10. He reports rest/sitting down/lying down and OTC medication. The patient reports the prednisone helped while he was taking it, but the symptoms have returned. Patient reports continued pain and inability to ambulate distances at all. He is spending most of time sitting every time he tries to stand and do any activity he gets severe pain particularly down the left leg. History of a right total hip replacement. He is also had a history of an MI in the past and a cardiac stent.  Past Medical History:  Diagnosis Date  . Acute encephalopathy 08/2010  . Adenomatous polyp   . Anxiety disorder   . Arthritis   . Chronic headaches   . Common migraine 12/23/2014  . Coronary atherosclerosis of native coronary artery    BMS nondominant RCA 12/2004  . Diverticulosis   . Essential hypertension, benign   . Gallbladder cholesterolosis   . GERD (gastroesophageal reflux disease)   . Hyperlipidemia   . Internal hemorrhoids   . Myocardial infarction ([place])    2006/1 stent  . Nephrolithiasis   . Shoulder pain, right   . Type 2 diabetes mellitus ([place])     Past Surgical History:  Procedure Laterality Date  . CATARACT EXTRACTION    . CHOLECYSTECTOMY    . COLONOSCOPY  09/10/2008   normal  . CYSTOSCOPY W/ URETEROSCOPY W/ LITHOTRIPSY  08/2010  . JOINT REPLACEMENT     right hip  . LEFT HEART CATHETERIZATION WITH CORONARY ANGIOGRAM N/A 05/14/2011   Procedure: LEFT HEART CATHETERIZATION WITH CORONARY ANGIOGRAM;  Surgeon: Sherren Mocha, MD;  Location: [facility] CATH LAB;  Service: Cardiovascular;  Laterality: N/A;  . ROTATOR CUFF REPAIR     right  . ROTATOR CUFF REPAIR Left 12/11/2015  . TOTAL KNEE ARTHROPLASTY     Bil  . UPPER GASTROINTESTINAL ENDOSCOPY  01/08/2008   bx, inlet  patch, duodenitis  . YAG LASER APPLICATION Right 8/58/8502   Procedure: YAG LASER APPLICATION;  Surgeon: Williams Che, MD;  Location: [facility];  Service: Ophthalmology;  Laterality: Right;    Family History  Problem Relation Age of Onset  . Colon cancer Mother        Diagnosed age 78  . Heart disease Father   . Breast cancer Sister   . Heart disease Brother   . Brain cancer Sister   . Heart disease Brother   . Bladder Cancer Brother   . Cancer - Other Brother    Social History:  reports that he quit smoking about 28 years ago. His smoking use included cigarettes. He started smoking about 54 years ago. He has a 25.00 pack-year smoking history. He quit smokeless tobacco use about 28 years ago. His smokeless tobacco use included chew. He reports that he drinks alcohol. He reports that he does not use drugs.  Allergies:  Allergies  Allergen Reactions  . Ace Inhibitors Hives, Swelling and Rash    Rash,hives,tongue swelling  . Angiotensin Receptor Blockers     Unknown reaction   . Ciprofloxacin Other (See Comments)    confusion  . Other Hives and Other (See Comments)    altaseptic  . Oxycodone-Acetaminophen Other (See Comments)    REACTION: unknown reaction  . Ramipril Cough  . Toradol [Ketorolac Tromethamine]  confusion  . Valium Other (See Comments)    Hallucinations; confusion  . Doxycycline Hives and Rash     (Not in a hospital admission)  No results found for this or any previous visit (from the past 48 hour(s)). No results found.  Review of Systems  Constitutional: Negative.   HENT: Negative.   Eyes: Negative.   Respiratory: Negative.   Cardiovascular: Negative.   Gastrointestinal: Negative.   Genitourinary: Negative.   Musculoskeletal: Positive for back pain.  Skin: Negative.   Neurological: Positive for sensory change and focal weakness.  Psychiatric/Behavioral: Negative.     There were no vitals taken for this visit. Physical Exam  Constitutional:  He is oriented to person, place, and time. He appears well-developed and well-nourished.  HENT:  Head: Normocephalic.  Eyes: Pupils are equal, round, and reactive to light.  Neck: Normal range of motion.  Cardiovascular: Normal rate.  Respiratory: Effort normal.  GI: Soft.  Musculoskeletal:  Gait and Station Appearance: ambulating with no assistive devices, antalgic gait  Constitutional General Appearance: healthy-appearing, distress (mild)  Psychiatric Mood and Affect: active and alert  Cardiovascular System Edema Right: none (Dorsalis and posterior tibial pulses 2+) Edema Left: none  Abdomen Inspection and Palpation: non-distended, no tenderness  Skin Inspection and palpation: no rash  Lumbar Spine Inspection: normal alignment Bony Palpation of the Lumbar Spine: (tender at lumbosacral junction.) Bony Palpation of the Right Hip: no tenderness of the greater trochanter, tenderness of the SI joint (Pelvis stable) Bony Palpation of the Left Hip: no tenderness of the greater trochanter Soft Tissue Palpation on the Right: (No flank pain with percussion) Active Range of Motion: (limited flexion and extention)  Motor Strength L1 Motor Strength on the Right: hip flexion iliopsoas 5/5 L1 Motor Strength on the Left: hip flexion iliopsoas 5/5 L2-L4 Motor Strength on the Right: knee extension quadriceps 5/5 L2-L4 Motor Strength on the Left: knee extension quadriceps 5/5 L5 Motor Strength on the Right: ankle dorsiflexion tibialis anterior 5/5, great toe extension extensor hallucis longus 5/5 L5 Motor Strength on the Left: ankle dorsiflexion tibialis anterior 5/5, great toe extension extensor hallucis longus 5/5 S1 Motor Strength on the Right: plantar flexion gastrocnemius 5/5 S1 Motor Strength on the Left: plantar flexion gastrocnemius 5/5  Neurological System Knee Reflex Right: normal (2) Knee Reflex Left: normal (2) Ankle Reflex Right: normal (2) Ankle Reflex Left: normal  (2) Babinski Reflex Right: plantar reflex absent Babinski Reflex Left: plantar reflex absent Sensation on the Right: normal distal extremities Sensation on the Left: normal distal extremities Special Tests on the Right: seated straight leg raising test negative, no clonus of the ankle/knee Special Tests on the Left: seated straight leg raising test negative, no clonus of the ankle/knee He has play quadricep weakness bilaterally. And EHL weakness bilaterally.  Upper extremities motor is 5/5 in the upper extremities sensory exams intact in the upper extremities is normoreflexic. No Hoffmann sign. No instability of shoulders elbows wrists or hands.  Neurological: He is alert and oriented to person, place, and time.  Skin: Skin is warm and dry.    X-rays demonstrate a total hip replacement on the right.  MRI demonstrates severe stenosis at L4-5. He has evidence of DISH. Moderate stenosis at L5-S1 left  Assessment/Plan Patient demonstrates a severe stenosis and neurogenic claudications at at L4-5. In the lateral recess at L5-S1. He predominantly has left leg pain. We have options of living with his symptoms using a knee to chest we discussed this in extensive detail in  terms of adaptive modifications which he is tried and he has had significant recurrent exacerbations. I do not feel epidural steroid series would be of any benefit as he has severe stenosis and it is only likely to attenuate the dura leading to complications with for future surgery. Walker or cane forward flexion stationary bike.. We discussed wound microlumbar decompression L4-5 possible removal of lamina 4 and also at L5-S1 including risks and benefits of bleeding infection damage to neurovascular to no changes in symptoms worsen symptoms DVT PE and anesthetic complications need for fusion in the future.  We discussed the perioperative course overnight to 2 nights in the hospital ambulation 6 weeks to healing.  Plan microlumbar  decompression L4-5, possible L3-4, possible L5-S1  Lorance Pickeral, Conley Rolls., PA-C for Dr. Tonita Cong 02/11/2017, 12:17 PM

## 2017-02-11 NOTE — H&P (View-Only) (Signed)
[name] is an 77 y.o. male.   Chief Complaint: back and left leg pain HPI: Patient reports lower back pain __ and leg pain on the left, __, __. He reports (normal) review of test results (lumbar spine). He reports pain level 9/10. He reports rest/sitting down/lying down and OTC medication. The patient reports the prednisone helped while he was taking it, but the symptoms have returned. Patient reports continued pain and inability to ambulate distances at all. He is spending most of time sitting every time he tries to stand and do any activity he gets severe pain particularly down the left leg. History of a right total hip replacement. He is also had a history of an MI in the past and a cardiac stent.  Past Medical History:  Diagnosis Date  . Acute encephalopathy 08/2010  . Adenomatous polyp   . Anxiety disorder   . Arthritis   . Chronic headaches   . Common migraine 12/23/2014  . Coronary atherosclerosis of native coronary artery    BMS nondominant RCA 12/2004  . Diverticulosis   . Essential hypertension, benign   . Gallbladder cholesterolosis   . GERD (gastroesophageal reflux disease)   . Hyperlipidemia   . Internal hemorrhoids   . Myocardial infarction ([place])    2006/1 stent  . Nephrolithiasis   . Shoulder pain, right   . Type 2 diabetes mellitus ([place])     Past Surgical History:  Procedure Laterality Date  . CATARACT EXTRACTION    . CHOLECYSTECTOMY    . COLONOSCOPY  09/10/2008   normal  . CYSTOSCOPY W/ URETEROSCOPY W/ LITHOTRIPSY  08/2010  . JOINT REPLACEMENT     right hip  . LEFT HEART CATHETERIZATION WITH CORONARY ANGIOGRAM N/A 05/14/2011   Procedure: LEFT HEART CATHETERIZATION WITH CORONARY ANGIOGRAM;  Surgeon: Sherren Mocha, MD;  Location: [facility] -  CATH LAB;  Service: Cardiovascular;  Laterality: N/A;  . ROTATOR CUFF REPAIR     right  . ROTATOR CUFF REPAIR Left 12/11/2015  . TOTAL KNEE ARTHROPLASTY     Bil  . UPPER GASTROINTESTINAL ENDOSCOPY  01/08/2008   bx, inlet  patch, duodenitis  . YAG LASER APPLICATION Right 4/69/6295   Procedure: YAG LASER APPLICATION;  Surgeon: Williams Che, MD;  Location: [facility];  Service: Ophthalmology;  Laterality: Right;    Family History  Problem Relation Age of Onset  . Colon cancer Mother        Diagnosed age 72  . Heart disease Father   . Breast cancer Sister   . Heart disease Brother   . Brain cancer Sister   . Heart disease Brother   . Bladder Cancer Brother   . Cancer - Other Brother    Social History:  reports that he quit smoking about 28 years ago. His smoking use included cigarettes. He started smoking about 54 years ago. He has a 25.00 pack-year smoking history. He quit smokeless tobacco use about 28 years ago. His smokeless tobacco use included chew. He reports that he drinks alcohol. He reports that he does not use drugs.  Allergies:  Allergies  Allergen Reactions  . Ace Inhibitors Hives, Swelling and Rash    Rash,hives,tongue swelling  . Angiotensin Receptor Blockers     Unknown reaction   . Ciprofloxacin Other (See Comments)    confusion  . Other Hives and Other (See Comments)    altaseptic  . Oxycodone-Acetaminophen Other (See Comments)    REACTION: unknown reaction  . Ramipril Cough  . Toradol [Ketorolac Tromethamine]  confusion  . Valium Other (See Comments)    Hallucinations; confusion  . Doxycycline Hives and Rash     (Not in a hospital admission)  No results found for this or any previous visit (from the past 48 hour(s)). No results found.  Review of Systems  Constitutional: Negative.   HENT: Negative.   Eyes: Negative.   Respiratory: Negative.   Cardiovascular: Negative.   Gastrointestinal: Negative.   Genitourinary: Negative.   Musculoskeletal: Positive for back pain.  Skin: Negative.   Neurological: Positive for sensory change and focal weakness.  Psychiatric/Behavioral: Negative.     There were no vitals taken for this visit. Physical Exam  Constitutional:  He is oriented to person, place, and time. He appears well-developed and well-nourished.  HENT:  Head: Normocephalic.  Eyes: Pupils are equal, round, and reactive to light.  Neck: Normal range of motion.  Cardiovascular: Normal rate.  Respiratory: Effort normal.  GI: Soft.  Musculoskeletal:  Gait and Station Appearance: ambulating with no assistive devices, antalgic gait  Constitutional General Appearance: healthy-appearing, distress (mild)  Psychiatric Mood and Affect: active and alert  Cardiovascular System Edema Right: none (Dorsalis and posterior tibial pulses 2+) Edema Left: none  Abdomen Inspection and Palpation: non-distended, no tenderness  Skin Inspection and palpation: no rash  Lumbar Spine Inspection: normal alignment Bony Palpation of the Lumbar Spine: (tender at lumbosacral junction.) Bony Palpation of the Right Hip: no tenderness of the greater trochanter, tenderness of the SI joint (Pelvis stable) Bony Palpation of the Left Hip: no tenderness of the greater trochanter Soft Tissue Palpation on the Right: (No flank pain with percussion) Active Range of Motion: (limited flexion and extention)  Motor Strength L1 Motor Strength on the Right: hip flexion iliopsoas 5/5 L1 Motor Strength on the Left: hip flexion iliopsoas 5/5 L2-L4 Motor Strength on the Right: knee extension quadriceps 5/5 L2-L4 Motor Strength on the Left: knee extension quadriceps 5/5 L5 Motor Strength on the Right: ankle dorsiflexion tibialis anterior 5/5, great toe extension extensor hallucis longus 5/5 L5 Motor Strength on the Left: ankle dorsiflexion tibialis anterior 5/5, great toe extension extensor hallucis longus 5/5 S1 Motor Strength on the Right: plantar flexion gastrocnemius 5/5 S1 Motor Strength on the Left: plantar flexion gastrocnemius 5/5  Neurological System Knee Reflex Right: normal (2) Knee Reflex Left: normal (2) Ankle Reflex Right: normal (2) Ankle Reflex Left: normal  (2) Babinski Reflex Right: plantar reflex absent Babinski Reflex Left: plantar reflex absent Sensation on the Right: normal distal extremities Sensation on the Left: normal distal extremities Special Tests on the Right: seated straight leg raising test negative, no clonus of the ankle/knee Special Tests on the Left: seated straight leg raising test negative, no clonus of the ankle/knee He has play quadricep weakness bilaterally. And EHL weakness bilaterally.  Upper extremities motor is 5/5 in the upper extremities sensory exams intact in the upper extremities is normoreflexic. No Hoffmann sign. No instability of shoulders elbows wrists or hands.  Neurological: He is alert and oriented to person, place, and time.  Skin: Skin is warm and dry.    X-rays demonstrate a total hip replacement on the right.  MRI demonstrates severe stenosis at L4-5. He has evidence of DISH. Moderate stenosis at L5-S1 left  Assessment/Plan Patient demonstrates a severe stenosis and neurogenic claudications at at L4-5. In the lateral recess at L5-S1. He predominantly has left leg pain. We have options of living with his symptoms using a knee to chest we discussed this in extensive detail in  terms of adaptive modifications which he is tried and he has had significant recurrent exacerbations. I do not feel epidural steroid series would be of any benefit as he has severe stenosis and it is only likely to attenuate the dura leading to complications with for future surgery. Walker or cane forward flexion stationary bike.. We discussed wound microlumbar decompression L4-5 possible removal of lamina 4 and also at L5-S1 including risks and benefits of bleeding infection damage to neurovascular to no changes in symptoms worsen symptoms DVT PE and anesthetic complications need for fusion in the future.  We discussed the perioperative course overnight to 2 nights in the hospital ambulation 6 weeks to healing.  Plan microlumbar  decompression L4-5, possible L3-4, possible L5-S1  BISSELL, Conley Rolls., PA-C for Dr. Tonita Cong 02/11/2017, 12:17 PM

## 2017-02-14 ENCOUNTER — Encounter (HOSPITAL_COMMUNITY): Payer: Self-pay

## 2017-02-14 NOTE — Patient Instructions (Addendum)
[name]  02/14/2017   Your procedure is scheduled on: 02-24-2017  Report to [facility] Main  Entrance    Follow signs to Short Stay on first floor at 530 AM  Call this number if you have problems the morning of surgery [phone]   Remember: Do not eat food or drink liquids :After Midnight.     Take these medicines the morning of surgery with A SIP OF WATER:  Amlodipine, Famotidine , tylenol DO NOT TAKE ANY DIABETIC MEDICATIONS DAY OF YOUR SURGERY                               You may not have any metal on your body including hair pins and              piercings  Do not wear jewelry,  lotions, powders or perfumes, deodorant                            Men may shave face and neck.   Do not bring valuables to the hospital. [place].  Contacts, dentures or bridgework may not be worn into surgery.  Leave suitcase in the car. After surgery it may be brought to your room.         How to Manage Your Diabetes Before and After Surgery  Why is it important to control my blood sugar before and after surgery? . Improving blood sugar levels before and after surgery helps healing and can limit problems. . A way of improving blood sugar control is eating a healthy diet by: o  Eating less sugar and carbohydrates o  Increasing activity/exercise o  Talking with your doctor about reaching your blood sugar goals . High blood sugars (greater than 180 mg/dL) can raise your risk of infections and slow your recovery, so you will need to focus on controlling your diabetes during the weeks before surgery. . Make sure that the doctor who takes care of your diabetes knows about your planned surgery including the date and location.  How do I manage my blood sugar before surgery? . Check your blood sugar at least 4 times a day, starting 2 days before surgery, to make sure that the level is not too high or low. o Check  your blood sugar the morning of your surgery when you wake up and every 2 hours until you get to the Short Stay unit. . If your blood sugar is less than 70 mg/dL, you will need to treat for low blood sugar: o Do not take insulin. o Treat a low blood sugar (less than 70 mg/dL) with  cup of clear juice (cranberry or apple), 4 glucose tablets, OR glucose gel. o Recheck blood sugar in 15 minutes after treatment (to make sure it is greater than 70 mg/dL). If your blood sugar is not greater than 70 mg/dL on recheck, call [phone] for further instructions. . Report your blood sugar to the short stay nurse when you get to Short Stay.  . If you are admitted to the hospital after surgery: o Your blood sugar will be checked by the staff and you will probably be given insulin after surgery (instead of oral diabetes  medicines) to make sure you have good blood sugar levels. o The goal for blood sugar control after surgery is 80-180 mg/dL.   WHAT DO I DO ABOUT MY DIABETES MEDICATION?  Marland Kitchen Do not take oral diabetes medicines (pills) the morning of surgery.  . THE NIGHT BEFORE SURGERY, take     1/2 dose of Lantus Insulin        . No Insulin morning of surgery   . The day of surgery, do not take other diabetes injectables, including Byetta (exenatide), Bydureon (exenatide ER), Victoza (liraglutide), or Trulicity (dulaglutide).  .  .      Patient Signature:  Date:   Nurse Signature:  Date:   Reviewed and Endorsed by [facility] Patient Education Committee, August 2015              Please read over the following fact sheets you were given: _____________________________________________________________________             [facility] - Preparing for Surgery Before surgery, you can play an important role.  Because skin is not sterile, your skin needs to be as free of germs as possible.  You can reduce the number of germs on your skin by washing with CHG (chlorahexidine gluconate) soap before  surgery.  CHG is an antiseptic cleaner which kills germs and bonds with the skin to continue killing germs even after washing. Please DO NOT use if you have an allergy to CHG or antibacterial soaps.  If your skin becomes reddened/irritated stop using the CHG and inform your nurse when you arrive at Short Stay. Do not shave (including legs and underarms) for at least 48 hours prior to the first CHG shower.  You may shave your face/neck. Please follow these instructions carefully:  1.  Shower with CHG Soap the night before surgery and the  morning of Surgery.  2.  If you choose to wash your hair, wash your hair first as usual with your  normal  shampoo.  3.  After you shampoo, rinse your hair and body thoroughly to remove the  shampoo.                           4.  Use CHG as you would any other liquid soap.  You can apply chg directly  to the skin and wash                       Gently with a scrungie or clean washcloth.  5.  Apply the CHG Soap to your body ONLY FROM THE NECK DOWN.   Do not use on face/ open                           Wound or open sores. Avoid contact with eyes, ears mouth and genitals (private parts).                       Wash face,  Genitals (private parts) with your normal soap.             6.  Wash thoroughly, paying special attention to the area where your surgery  will be performed.  7.  Thoroughly rinse your body with warm water from the neck down.  8.  DO NOT shower/wash with your normal soap after using and rinsing off  the CHG Soap.  9.  Pat yourself dry with a clean towel.            10.  Wear clean pajamas.            11.  Place clean sheets on your bed the night of your first shower and do not  sleep with pets. Day of Surgery : Do not apply any lotions/deodorants the morning of surgery.  Please wear clean clothes to the hospital/surgery center.  FAILURE TO FOLLOW THESE INSTRUCTIONS MAY RESULT IN THE CANCELLATION OF YOUR SURGERY PATIENT  SIGNATURE_________________________________  NURSE SIGNATURE__________________________________  ________________________________________________________________________   Adam Phenix  An incentive spirometer is a tool that can help keep your lungs clear and active. This tool measures how well you are filling your lungs with each breath. Taking long deep breaths may help reverse or decrease the chance of developing breathing (pulmonary) problems (especially infection) following:  A long period of time when you are unable to move or be active. BEFORE THE PROCEDURE   If the spirometer includes an indicator to show your best effort, your nurse or respiratory therapist will set it to a desired goal.  If possible, sit up straight or lean slightly forward. Try not to slouch.  Hold the incentive spirometer in an upright position. INSTRUCTIONS FOR USE  1. Sit on the edge of your bed if possible, or sit up as far as you can in bed or on a chair. 2. Hold the incentive spirometer in an upright position. 3. Breathe out normally. 4. Place the mouthpiece in your mouth and seal your lips tightly around it. 5. Breathe in slowly and as deeply as possible, raising the piston or the ball toward the top of the column. 6. Hold your breath for 3-5 seconds or for as long as possible. Allow the piston or ball to fall to the bottom of the column. 7. Remove the mouthpiece from your mouth and breathe out normally. 8. Rest for a few seconds and repeat Steps 1 through 7 at least 10 times every 1-2 hours when you are awake. Take your time and take a few normal breaths between deep breaths. 9. The spirometer may include an indicator to show your best effort. Use the indicator as a goal to work toward during each repetition. 10. After each set of 10 deep breaths, practice coughing to be sure your lungs are clear. If you have an incision (the cut made at the time of surgery), support your incision when coughing  by placing a pillow or rolled up towels firmly against it. Once you are able to get out of bed, walk around indoors and cough well. You may stop using the incentive spirometer when instructed by your caregiver.  RISKS AND COMPLICATIONS  Take your time so you do not get dizzy or light-headed.  If you are in pain, you may need to take or ask for pain medication before doing incentive spirometry. It is harder to take a deep breath if you are having pain. AFTER USE  Rest and breathe slowly and easily.  It can be helpful to keep track of a log of your progress. Your caregiver can provide you with a simple table to help with this. If you are using the spirometer at home, follow these instructions: [place] IF:   You are having difficultly using the spirometer.  You have trouble using the spirometer as often as instructed.  Your pain medication is not giving enough relief while using the spirometer.  You  develop fever of 100.5 F (38.1 C) or higher. SEEK IMMEDIATE MEDICAL CARE IF:   You cough up bloody sputum that had not been present before.  You develop fever of 102 F (38.9 C) or greater.  You develop worsening pain at or near the incision site. MAKE SURE YOU:   Understand these instructions.  Will watch your condition.  Will get help right away if you are not doing well or get worse. Document Released: 05/10/2006 Document Revised: 03/22/2011 Document Reviewed: 07/11/2006 [facility] Patient Information 2014 [place], [place].   ________________________________________________________________________

## 2017-02-16 ENCOUNTER — Encounter (HOSPITAL_COMMUNITY): Payer: Self-pay

## 2017-02-16 ENCOUNTER — Ambulatory Visit: Payer: Self-pay | Admitting: Orthopedic Surgery

## 2017-02-16 ENCOUNTER — Other Ambulatory Visit: Payer: Self-pay

## 2017-02-16 ENCOUNTER — Ambulatory Visit (HOSPITAL_COMMUNITY)
Admission: RE | Admit: 2017-02-16 | Discharge: 2017-02-16 | Disposition: A | Discharge status: Home / Self Care | Payer: Medicare Other | Source: Ambulatory Visit | Attending: Orthopedic Surgery | Admitting: Orthopedic Surgery

## 2017-02-16 ENCOUNTER — Encounter (HOSPITAL_COMMUNITY)
Admission: RE | Admit: 2017-02-16 | Discharge: 2017-02-16 | Disposition: A | Discharge status: Home / Self Care | Payer: Medicare Other | Source: Ambulatory Visit | Attending: Specialist | Admitting: Specialist

## 2017-02-16 DIAGNOSIS — M48061 Spinal stenosis, lumbar region without neurogenic claudication: Secondary | ICD-10-CM | POA: Diagnosis not present

## 2017-02-16 DIAGNOSIS — E785 Hyperlipidemia, unspecified: Secondary | ICD-10-CM | POA: Diagnosis not present

## 2017-02-16 DIAGNOSIS — K219 Gastro-esophageal reflux disease without esophagitis: Secondary | ICD-10-CM | POA: Insufficient documentation

## 2017-02-16 DIAGNOSIS — M2578 Osteophyte, vertebrae: Secondary | ICD-10-CM | POA: Insufficient documentation

## 2017-02-16 DIAGNOSIS — M5126 Other intervertebral disc displacement, lumbar region: Secondary | ICD-10-CM

## 2017-02-16 DIAGNOSIS — E1169 Type 2 diabetes mellitus with other specified complication: Secondary | ICD-10-CM | POA: Diagnosis not present

## 2017-02-16 DIAGNOSIS — Z01812 Encounter for preprocedural laboratory examination: Secondary | ICD-10-CM | POA: Diagnosis not present

## 2017-02-16 DIAGNOSIS — Z01818 Encounter for other preprocedural examination: Secondary | ICD-10-CM | POA: Insufficient documentation

## 2017-02-16 DIAGNOSIS — I2510 Atherosclerotic heart disease of native coronary artery without angina pectoris: Secondary | ICD-10-CM | POA: Insufficient documentation

## 2017-02-16 HISTORY — DX: Personal history of adenomatous and serrated colon polyps: Z86.0101

## 2017-02-16 HISTORY — DX: Type 2 diabetes mellitus without complications: E11.9

## 2017-02-16 HISTORY — DX: Other disturbances of skin sensation: R20.8

## 2017-02-16 HISTORY — DX: Nocturia: R35.1

## 2017-02-16 HISTORY — DX: Spinal stenosis, site unspecified: M48.00

## 2017-02-16 HISTORY — DX: Personal history of urinary calculi: Z87.442

## 2017-02-16 HISTORY — DX: Presence of dental prosthetic device (complete) (partial): Z97.2

## 2017-02-16 HISTORY — DX: Urgency of urination: R39.15

## 2017-02-16 HISTORY — DX: Personal history of colonic polyps: Z86.010

## 2017-02-16 HISTORY — DX: Long term (current) use of insulin: Z79.4

## 2017-02-16 HISTORY — DX: Old myocardial infarction: I25.2

## 2017-02-16 LAB — BASIC METABOLIC PANEL
Anion gap: 11 (ref 5–15)
BUN: 16 mg/dL (ref 6–20)
CALCIUM: 9.6 mg/dL (ref 8.9–10.3)
CHLORIDE: 98 mmol/L — AB (ref 101–111)
CO2: 27 mmol/L (ref 22–32)
Creatinine, Ser: 1.01 mg/dL (ref 0.61–1.24)
GFR calc non Af Amer: >60 mL/min (ref >60)
Glucose, Bld: 134 mg/dL — ABNORMAL HIGH (ref 65–99)
POTASSIUM: 3.9 mmol/L (ref 3.5–5.1)
Sodium: 136 mmol/L (ref 135–145)

## 2017-02-16 LAB — CBC
HEMATOCRIT: 46.9 % (ref 39.0–52.0)
HEMOGLOBIN: 16.2 g/dL (ref 13.0–17.0)
MCH: 31.3 pg (ref 26.0–34.0)
MCHC: 34.5 g/dL (ref 30.0–36.0)
MCV: 90.5 fL (ref 78.0–100.0)
Platelets: 256 10*3/uL (ref 150–400)
RBC: 5.18 MIL/uL (ref 4.22–5.81)
RDW: 13.5 % (ref 11.5–15.5)
WBC: 7.6 10*3/uL (ref 4.0–10.5)

## 2017-02-16 LAB — SURGICAL PCR SCREEN
MRSA, PCR: NEGATIVE
Staphylococcus aureus: POSITIVE — AB

## 2017-02-16 LAB — GLUCOSE, CAPILLARY: Glucose-Capillary: 145 mg/dL — ABNORMAL HIGH (ref 65–99)

## 2017-02-16 NOTE — Progress Notes (Signed)
Routed pcr screen results dated 02-16-2017 to dr beane.

## 2017-02-16 NOTE — Progress Notes (Signed)
Cardiac clearance from dr Domenic Polite dated 02-04-2017, in chart.  Dr Duard Brady lov note in epic , dated 09-14-2016.  EKG dated 0.-07-2016 in epic.  A1c dated 01-27-2017 , 7.4, in epic.

## 2017-02-18 ENCOUNTER — Ambulatory Visit: Payer: Self-pay | Admitting: Orthopedic Surgery

## 2017-02-24 ENCOUNTER — Ambulatory Visit (HOSPITAL_COMMUNITY): Payer: Medicare Other

## 2017-02-24 ENCOUNTER — Other Ambulatory Visit: Payer: Self-pay

## 2017-02-24 ENCOUNTER — Encounter (HOSPITAL_COMMUNITY): Payer: Self-pay | Admitting: Emergency Medicine

## 2017-02-24 ENCOUNTER — Ambulatory Visit (HOSPITAL_COMMUNITY): Payer: Medicare Other | Admitting: Registered Nurse

## 2017-02-24 ENCOUNTER — Encounter (HOSPITAL_COMMUNITY)
Admission: AD | Disposition: A | Discharge status: Home / Self Care | Payer: Self-pay | Source: Ambulatory Visit | Attending: Specialist

## 2017-02-24 ENCOUNTER — Observation Stay (HOSPITAL_COMMUNITY)
Admission: AD | Admit: 2017-02-24 | Discharge: 2017-02-25 | Disposition: A | Discharge status: Home / Self Care | Payer: Medicare Other | Source: Ambulatory Visit | Attending: Specialist | Admitting: Specialist

## 2017-02-24 DIAGNOSIS — I10 Essential (primary) hypertension: Secondary | ICD-10-CM | POA: Insufficient documentation

## 2017-02-24 DIAGNOSIS — F419 Anxiety disorder, unspecified: Secondary | ICD-10-CM | POA: Diagnosis not present

## 2017-02-24 DIAGNOSIS — E119 Type 2 diabetes mellitus without complications: Secondary | ICD-10-CM | POA: Diagnosis not present

## 2017-02-24 DIAGNOSIS — E785 Hyperlipidemia, unspecified: Secondary | ICD-10-CM | POA: Insufficient documentation

## 2017-02-24 DIAGNOSIS — Z79899 Other long term (current) drug therapy: Secondary | ICD-10-CM | POA: Diagnosis not present

## 2017-02-24 DIAGNOSIS — K219 Gastro-esophageal reflux disease without esophagitis: Secondary | ICD-10-CM | POA: Insufficient documentation

## 2017-02-24 DIAGNOSIS — Z7982 Long term (current) use of aspirin: Secondary | ICD-10-CM | POA: Insufficient documentation

## 2017-02-24 DIAGNOSIS — M4807 Spinal stenosis, lumbosacral region: Secondary | ICD-10-CM | POA: Insufficient documentation

## 2017-02-24 DIAGNOSIS — I2510 Atherosclerotic heart disease of native coronary artery without angina pectoris: Secondary | ICD-10-CM | POA: Diagnosis not present

## 2017-02-24 DIAGNOSIS — Z419 Encounter for procedure for purposes other than remedying health state, unspecified: Secondary | ICD-10-CM

## 2017-02-24 DIAGNOSIS — M48062 Spinal stenosis, lumbar region with neurogenic claudication: Secondary | ICD-10-CM | POA: Diagnosis not present

## 2017-02-24 DIAGNOSIS — Z87891 Personal history of nicotine dependence: Secondary | ICD-10-CM | POA: Insufficient documentation

## 2017-02-24 DIAGNOSIS — Z794 Long term (current) use of insulin: Secondary | ICD-10-CM | POA: Diagnosis not present

## 2017-02-24 DIAGNOSIS — I252 Old myocardial infarction: Secondary | ICD-10-CM | POA: Diagnosis not present

## 2017-02-24 DIAGNOSIS — Z981 Arthrodesis status: Secondary | ICD-10-CM | POA: Diagnosis not present

## 2017-02-24 DIAGNOSIS — M48061 Spinal stenosis, lumbar region without neurogenic claudication: Secondary | ICD-10-CM | POA: Diagnosis not present

## 2017-02-24 HISTORY — PX: LUMBAR LAMINECTOMY/DECOMPRESSION MICRODISCECTOMY: SHX5026

## 2017-02-24 LAB — TYPE AND SCREEN
ABO/RH(D): O POS
ANTIBODY SCREEN: NEGATIVE

## 2017-02-24 LAB — GLUCOSE, CAPILLARY
GLUCOSE-CAPILLARY: 215 mg/dL — AB (ref 65–99)
GLUCOSE-CAPILLARY: 260 mg/dL — AB (ref 65–99)
Glucose-Capillary: 176 mg/dL — ABNORMAL HIGH (ref 65–99)
Glucose-Capillary: 209 mg/dL — ABNORMAL HIGH (ref 65–99)
Glucose-Capillary: 263 mg/dL — ABNORMAL HIGH (ref 65–99)

## 2017-02-24 SURGERY — LUMBAR LAMINECTOMY/DECOMPRESSION MICRODISCECTOMY 3 LEVELS
Anesthesia: General

## 2017-02-24 MED ORDER — INSULIN ASPART 100 UNIT/ML ~~LOC~~ SOLN
0.0000 [IU] | Freq: Three times a day (TID) | SUBCUTANEOUS | Status: DC
  Administered 2017-02-24: 8 [IU] via SUBCUTANEOUS
  Administered 2017-02-25: 5 [IU] via SUBCUTANEOUS

## 2017-02-24 MED ORDER — GLYCOPYRROLATE 0.2 MG/ML IJ SOLN
INTRAMUSCULAR | Status: DC | PRN
  Administered 2017-02-24: 0.2 mg via INTRAVENOUS

## 2017-02-24 MED ORDER — ALUM & MAG HYDROXIDE-SIMETH 200-200-20 MG/5ML PO SUSP
30.0000 mL | Freq: Four times a day (QID) | ORAL | Status: DC | PRN
  Administered 2017-02-25: 30 mL via ORAL
  Filled 2017-02-24: qty 30

## 2017-02-24 MED ORDER — ACETAMINOPHEN 10 MG/ML IV SOLN
1000.0000 mg | Freq: Four times a day (QID) | INTRAVENOUS | Status: DC
  Administered 2017-02-24 – 2017-02-25 (×3): 1000 mg via INTRAVENOUS
  Filled 2017-02-24 (×4): qty 100

## 2017-02-24 MED ORDER — ACETAMINOPHEN 10 MG/ML IV SOLN
INTRAVENOUS | Status: AC
  Filled 2017-02-24: qty 100

## 2017-02-24 MED ORDER — FENTANYL CITRATE (PF) 250 MCG/5ML IJ SOLN
INTRAMUSCULAR | Status: AC
  Filled 2017-02-24: qty 5

## 2017-02-24 MED ORDER — INSULIN ASPART 100 UNIT/ML ~~LOC~~ SOLN
5.0000 [IU] | Freq: Once | SUBCUTANEOUS | Status: AC
  Administered 2017-02-24: 5 [IU] via SUBCUTANEOUS

## 2017-02-24 MED ORDER — PROPOFOL 10 MG/ML IV BOLUS
INTRAVENOUS | Status: DC | PRN
  Administered 2017-02-24: 120 mg via INTRAVENOUS
  Administered 2017-02-24: 50 mg via INTRAVENOUS

## 2017-02-24 MED ORDER — EPHEDRINE SULFATE 50 MG/ML IJ SOLN
INTRAMUSCULAR | Status: DC | PRN
  Administered 2017-02-24: 10 mg via INTRAVENOUS

## 2017-02-24 MED ORDER — LABETALOL HCL 5 MG/ML IV SOLN
INTRAVENOUS | Status: DC | PRN
  Administered 2017-02-24: 5 mg via INTRAVENOUS

## 2017-02-24 MED ORDER — BUPIVACAINE-EPINEPHRINE 0.5% -1:200000 IJ SOLN
INTRAMUSCULAR | Status: AC
  Filled 2017-02-24: qty 1

## 2017-02-24 MED ORDER — SUGAMMADEX SODIUM 200 MG/2ML IV SOLN
INTRAVENOUS | Status: AC
  Filled 2017-02-24: qty 2

## 2017-02-24 MED ORDER — ONDANSETRON HCL 4 MG PO TABS: 4.0000 mg | ORAL_TABLET | Freq: Four times a day (QID) | ORAL | Status: DC | PRN

## 2017-02-24 MED ORDER — GLYCOPYRROLATE 0.2 MG/ML IV SOSY
PREFILLED_SYRINGE | INTRAVENOUS | Status: AC
  Filled 2017-02-24: qty 3

## 2017-02-24 MED ORDER — SODIUM CHLORIDE 0.9 % IV SOLN
INTRAVENOUS | Status: AC
  Filled 2017-02-24: qty 500000

## 2017-02-24 MED ORDER — CHLORTHALIDONE 25 MG PO TABS
12.5000 mg | ORAL_TABLET | Freq: Every day | ORAL | Status: DC
  Administered 2017-02-25: 12.5 mg via ORAL
  Filled 2017-02-24: qty 1

## 2017-02-24 MED ORDER — THROMBIN (RECOMBINANT) 5000 UNITS EX SOLR
CUTANEOUS | Status: AC
  Filled 2017-02-24: qty 10000

## 2017-02-24 MED ORDER — MAGNESIUM CITRATE PO SOLN: 1.0000 | Freq: Once | ORAL | Status: DC | PRN

## 2017-02-24 MED ORDER — NITROGLYCERIN 0.4 MG SL SUBL: 0.4000 mg | SUBLINGUAL_TABLET | SUBLINGUAL | Status: DC | PRN

## 2017-02-24 MED ORDER — BUPIVACAINE-EPINEPHRINE (PF) 0.5% -1:200000 IJ SOLN
INTRAMUSCULAR | Status: DC | PRN
  Administered 2017-02-24: 15 mL

## 2017-02-24 MED ORDER — HYDROMORPHONE HCL 1 MG/ML IJ SOLN
0.2500 mg | INTRAMUSCULAR | Status: DC | PRN
  Administered 2017-02-24 (×2): 0.5 mg via INTRAVENOUS

## 2017-02-24 MED ORDER — HYDROCODONE-ACETAMINOPHEN 5-325 MG PO TABS
1.0000 | ORAL_TABLET | ORAL | Status: DC | PRN
  Administered 2017-02-24: 1 via ORAL
  Administered 2017-02-25: 2 via ORAL
  Filled 2017-02-24: qty 2
  Filled 2017-02-24 (×2): qty 1

## 2017-02-24 MED ORDER — MIDAZOLAM HCL 2 MG/2ML IJ SOLN
INTRAMUSCULAR | Status: AC
  Filled 2017-02-24: qty 2

## 2017-02-24 MED ORDER — PHENYLEPHRINE HCL 10 MG/ML IJ SOLN
INTRAMUSCULAR | Status: DC | PRN
  Administered 2017-02-24: 25 ug/min via INTRAVENOUS

## 2017-02-24 MED ORDER — ACETAMINOPHEN 325 MG PO TABS: 650.0000 mg | ORAL_TABLET | ORAL | Status: DC | PRN

## 2017-02-24 MED ORDER — ONDANSETRON HCL 4 MG/2ML IJ SOLN
INTRAMUSCULAR | Status: AC
Start: 2017-02-24 — End: ?
  Filled 2017-02-24: qty 2

## 2017-02-24 MED ORDER — LIDOCAINE 2% (20 MG/ML) 5 ML SYRINGE
INTRAMUSCULAR | Status: AC
  Filled 2017-02-24: qty 5

## 2017-02-24 MED ORDER — HYDROMORPHONE HCL 1 MG/ML IJ SOLN
INTRAMUSCULAR | Status: AC
  Filled 2017-02-24: qty 1

## 2017-02-24 MED ORDER — POTASSIUM CHLORIDE IN NACL 20-0.9 MEQ/L-% IV SOLN
INTRAVENOUS | Status: DC
  Administered 2017-02-24: 15:00:00 via INTRAVENOUS
  Filled 2017-02-24 (×2): qty 1000

## 2017-02-24 MED ORDER — LABETALOL HCL 5 MG/ML IV SOLN
INTRAVENOUS | Status: AC
  Filled 2017-02-24: qty 4

## 2017-02-24 MED ORDER — LORATADINE 10 MG PO TABS
10.0000 mg | ORAL_TABLET | Freq: Every day | ORAL | Status: DC
  Administered 2017-02-24 – 2017-02-25 (×2): 10 mg via ORAL
  Filled 2017-02-24 (×2): qty 1

## 2017-02-24 MED ORDER — ROCURONIUM BROMIDE 10 MG/ML (PF) SYRINGE
PREFILLED_SYRINGE | INTRAVENOUS | Status: AC
  Filled 2017-02-24: qty 5

## 2017-02-24 MED ORDER — INSULIN ASPART 100 UNIT/ML ~~LOC~~ SOLN
SUBCUTANEOUS | Status: AC
  Filled 2017-02-24: qty 1

## 2017-02-24 MED ORDER — ONDANSETRON HCL 4 MG/2ML IJ SOLN: 4.0000 mg | Freq: Four times a day (QID) | INTRAMUSCULAR | Status: DC | PRN

## 2017-02-24 MED ORDER — DOCUSATE SODIUM 100 MG PO CAPS
100.0000 mg | ORAL_CAPSULE | Freq: Two times a day (BID) | ORAL | Status: DC
  Administered 2017-02-24 – 2017-02-25 (×2): 100 mg via ORAL
  Filled 2017-02-24 (×2): qty 1

## 2017-02-24 MED ORDER — TAMSULOSIN HCL 0.4 MG PO CAPS
0.4000 mg | ORAL_CAPSULE | Freq: Every day | ORAL | Status: DC
  Administered 2017-02-24 – 2017-02-25 (×2): 0.4 mg via ORAL
  Filled 2017-02-24 (×2): qty 1

## 2017-02-24 MED ORDER — DEXAMETHASONE SODIUM PHOSPHATE 10 MG/ML IJ SOLN
INTRAMUSCULAR | Status: DC | PRN
  Administered 2017-02-24: 10 mg via INTRAVENOUS

## 2017-02-24 MED ORDER — SODIUM CHLORIDE 0.9 % IV SOLN
INTRAVENOUS | Status: DC | PRN
  Administered 2017-02-24: 500 mL

## 2017-02-24 MED ORDER — FENTANYL CITRATE (PF) 100 MCG/2ML IJ SOLN
INTRAMUSCULAR | Status: DC | PRN
  Administered 2017-02-24 (×2): 50 ug via INTRAVENOUS
  Administered 2017-02-24: 100 ug via INTRAVENOUS
  Administered 2017-02-24: 50 ug via INTRAVENOUS

## 2017-02-24 MED ORDER — AMLODIPINE BESYLATE 5 MG PO TABS
5.0000 mg | ORAL_TABLET | Freq: Every day | ORAL | Status: DC
  Administered 2017-02-25: 5 mg via ORAL
  Filled 2017-02-24: qty 1

## 2017-02-24 MED ORDER — DULAGLUTIDE 1.5 MG/0.5ML ~~LOC~~ SOAJ: 1.5000 mg | SUBCUTANEOUS | Status: DC

## 2017-02-24 MED ORDER — INSULIN GLARGINE 100 UNIT/ML ~~LOC~~ SOLN
48.0000 [IU] | Freq: Every day | SUBCUTANEOUS | Status: DC
  Administered 2017-02-24: 48 [IU] via SUBCUTANEOUS
  Filled 2017-02-24 (×2): qty 0.48

## 2017-02-24 MED ORDER — SUGAMMADEX SODIUM 200 MG/2ML IV SOLN
INTRAVENOUS | Status: DC | PRN
  Administered 2017-02-24: 200 mg via INTRAVENOUS

## 2017-02-24 MED ORDER — POLYETHYLENE GLYCOL 3350 17 G PO PACK
17.0000 g | PACK | Freq: Every day | ORAL | Status: DC | PRN
Start: 2017-02-24 — End: 2017-02-25

## 2017-02-24 MED ORDER — PHENOL 1.4 % MT LIQD
1.0000 | OROMUCOSAL | Status: DC | PRN
Start: 2017-02-24 — End: 2017-02-25
  Filled 2017-02-24: qty 177

## 2017-02-24 MED ORDER — DOCUSATE SODIUM 100 MG PO CAPS: 100.0000 mg | ORAL_CAPSULE | Freq: Two times a day (BID) | ORAL | 2 refills | Status: AC

## 2017-02-24 MED ORDER — PHENYLEPHRINE 40 MCG/ML (10ML) SYRINGE FOR IV PUSH (FOR BLOOD PRESSURE SUPPORT)
PREFILLED_SYRINGE | INTRAVENOUS | Status: AC
  Filled 2017-02-24: qty 10

## 2017-02-24 MED ORDER — PHENYLEPHRINE HCL 10 MG/ML IJ SOLN
INTRAMUSCULAR | Status: DC | PRN
  Administered 2017-02-24 (×2): 80 ug via INTRAVENOUS

## 2017-02-24 MED ORDER — DEXAMETHASONE SODIUM PHOSPHATE 10 MG/ML IJ SOLN
INTRAMUSCULAR | Status: AC
Start: 2017-02-24 — End: ?
  Filled 2017-02-24: qty 1

## 2017-02-24 MED ORDER — CLINDAMYCIN PHOSPHATE 900 MG/50ML IV SOLN
900.0000 mg | INTRAVENOUS | Status: AC
  Administered 2017-02-24: 900 mg via INTRAVENOUS
  Filled 2017-02-24: qty 50

## 2017-02-24 MED ORDER — PROMETHAZINE HCL 25 MG/ML IJ SOLN
6.2500 mg | INTRAMUSCULAR | Status: DC | PRN
Start: 2017-02-24 — End: 2017-02-24

## 2017-02-24 MED ORDER — MENTHOL 3 MG MT LOZG: 1.0000 | LOZENGE | OROMUCOSAL | Status: DC | PRN

## 2017-02-24 MED ORDER — PAROXETINE HCL 20 MG PO TABS
20.0000 mg | ORAL_TABLET | Freq: Every evening | ORAL | Status: DC
  Administered 2017-02-24: 20 mg via ORAL
  Filled 2017-02-24: qty 1

## 2017-02-24 MED ORDER — THROMBIN (RECOMBINANT) 5000 UNITS EX SOLR
CUTANEOUS | Status: DC | PRN
  Administered 2017-02-24: 10000 [IU] via TOPICAL

## 2017-02-24 MED ORDER — LOSARTAN POTASSIUM 50 MG PO TABS
50.0000 mg | ORAL_TABLET | Freq: Every day | ORAL | Status: DC
  Administered 2017-02-25: 50 mg via ORAL
  Filled 2017-02-24: qty 1

## 2017-02-24 MED ORDER — BISACODYL 5 MG PO TBEC: 5.0000 mg | DELAYED_RELEASE_TABLET | Freq: Every day | ORAL | Status: DC | PRN

## 2017-02-24 MED ORDER — ACETAMINOPHEN 10 MG/ML IV SOLN
INTRAVENOUS | Status: DC | PRN
  Administered 2017-02-24: 1000 mg via INTRAVENOUS

## 2017-02-24 MED ORDER — PROPOFOL 10 MG/ML IV BOLUS
INTRAVENOUS | Status: AC
  Filled 2017-02-24: qty 40

## 2017-02-24 MED ORDER — HYDROCODONE-ACETAMINOPHEN 10-325 MG PO TABS: 1.0000 | ORAL_TABLET | ORAL | 0 refills | Status: DC | PRN

## 2017-02-24 MED ORDER — MIDAZOLAM HCL 5 MG/5ML IJ SOLN
INTRAMUSCULAR | Status: DC | PRN
  Administered 2017-02-24: 1 mg via INTRAVENOUS

## 2017-02-24 MED ORDER — POLYETHYLENE GLYCOL 3350 17 G PO PACK: 17.0000 g | PACK | Freq: Every day | ORAL | 0 refills | Status: DC

## 2017-02-24 MED ORDER — CEFAZOLIN SODIUM-DEXTROSE 2-4 GM/100ML-% IV SOLN
2.0000 g | INTRAVENOUS | Status: AC
  Administered 2017-02-24: 2 g via INTRAVENOUS
  Filled 2017-02-24: qty 100

## 2017-02-24 MED ORDER — ACETAMINOPHEN 650 MG RE SUPP: 650.0000 mg | RECTAL | Status: DC | PRN

## 2017-02-24 MED ORDER — LACTATED RINGERS IV SOLN
INTRAVENOUS | Status: DC
  Administered 2017-02-24 (×3): via INTRAVENOUS

## 2017-02-24 MED ORDER — FAMOTIDINE 20 MG PO TABS
20.0000 mg | ORAL_TABLET | Freq: Two times a day (BID) | ORAL | Status: DC
  Administered 2017-02-24 – 2017-02-25 (×2): 20 mg via ORAL
  Filled 2017-02-24 (×2): qty 1

## 2017-02-24 MED ORDER — BUTALBITAL-APAP-CAFFEINE 50-325-40 MG PO TABS: 1.0000 | ORAL_TABLET | Freq: Two times a day (BID) | ORAL | Status: DC | PRN

## 2017-02-24 MED ORDER — ROCURONIUM BROMIDE 100 MG/10ML IV SOLN
INTRAVENOUS | Status: DC | PRN
  Administered 2017-02-24: 20 mg via INTRAVENOUS
  Administered 2017-02-24: 70 mg via INTRAVENOUS

## 2017-02-24 MED ORDER — CEFAZOLIN SODIUM-DEXTROSE 2-4 GM/100ML-% IV SOLN
2.0000 g | Freq: Three times a day (TID) | INTRAVENOUS | Status: AC
  Administered 2017-02-24 – 2017-02-25 (×3): 2 g via INTRAVENOUS
  Filled 2017-02-24 (×3): qty 100

## 2017-02-24 MED ORDER — LIDOCAINE HCL (CARDIAC) 20 MG/ML IV SOLN
INTRAVENOUS | Status: DC | PRN
  Administered 2017-02-24: 25 mg via INTRATRACHEAL
  Administered 2017-02-24: 50 mg via INTRAVENOUS

## 2017-02-24 SURGICAL SUPPLY — 53 items
AGENT HMST SPONGE THK3/8 (HEMOSTASIS)
BAG SPEC THK2 15X12 ZIP CLS (MISCELLANEOUS)
BAG ZIPLOCK 12X15 (MISCELLANEOUS) IMPLANT
CLEANER TIP ELECTROSURG 2X2 (MISCELLANEOUS) ×2 IMPLANT
CLOTH 2% CHLOROHEXIDINE 3PK (PERSONAL CARE ITEMS) ×2 IMPLANT
COVER SURGICAL LIGHT HANDLE (MISCELLANEOUS) ×2 IMPLANT
DRAPE MICROSCOPE LEICA (MISCELLANEOUS) ×2 IMPLANT
DRAPE SHEET LG 3/4 BI-LAMINATE (DRAPES) IMPLANT
DRAPE SURG 17X11 SM STRL (DRAPES) ×2 IMPLANT
DRAPE UTILITY XL STRL (DRAPES) ×2 IMPLANT
DRESSING AQUACEL AG SP 3.5X6 (GAUZE/BANDAGES/DRESSINGS) IMPLANT
DRSG AQUACEL AG ADV 3.5X 4 (GAUZE/BANDAGES/DRESSINGS) IMPLANT
DRSG AQUACEL AG ADV 3.5X 6 (GAUZE/BANDAGES/DRESSINGS) IMPLANT
DRSG AQUACEL AG SP 3.5X6 (GAUZE/BANDAGES/DRESSINGS) ×2
DURAFORM SPONGE 2X2 SINGLE (Neuro Prosthesis/Implant) ×1 IMPLANT
DURAPREP 26ML APPLICATOR (WOUND CARE) ×2 IMPLANT
DURASEAL SPINE SEALANT 3ML (MISCELLANEOUS) IMPLANT
ELECT BLADE TIP CTD 4 INCH (ELECTRODE) IMPLANT
ELECT REM PT RETURN 15FT ADLT (MISCELLANEOUS) ×2 IMPLANT
GLOVE BIOGEL PI IND STRL 7.0 (GLOVE) ×1 IMPLANT
GLOVE BIOGEL PI INDICATOR 7.0 (GLOVE) ×1
GLOVE SURG SS PI 7.0 STRL IVOR (GLOVE) ×2 IMPLANT
GLOVE SURG SS PI 8.0 STRL IVOR (GLOVE) ×4 IMPLANT
GOWN STRL REUS W/TWL XL LVL3 (GOWN DISPOSABLE) ×4 IMPLANT
HEMOSTAT SPONGE AVITENE ULTRA (HEMOSTASIS) IMPLANT
IV CATH 14GX2 1/4 (CATHETERS) ×1 IMPLANT
KIT BASIN OR (CUSTOM PROCEDURE TRAY) ×2 IMPLANT
KIT POSITIONING SURG ANDREWS (MISCELLANEOUS) ×2 IMPLANT
MANIFOLD NEPTUNE II (INSTRUMENTS) ×2 IMPLANT
MARKER SKIN DUAL TIP RULER LAB (MISCELLANEOUS) ×2 IMPLANT
NDL SPNL 18GX3.5 QUINCKE PK (NEEDLE) ×2 IMPLANT
NEEDLE SPNL 18GX3.5 QUINCKE PK (NEEDLE) ×4 IMPLANT
PACK LAMINECTOMY ORTHO (CUSTOM PROCEDURE TRAY) ×2 IMPLANT
PATTIES SURGICAL .5 X.5 (GAUZE/BANDAGES/DRESSINGS) ×2 IMPLANT
PATTIES SURGICAL .75X.75 (GAUZE/BANDAGES/DRESSINGS) ×2 IMPLANT
PATTIES SURGICAL 1X1 (DISPOSABLE) IMPLANT
RUBBERBAND STERILE (MISCELLANEOUS) ×2 IMPLANT
SPONGE LAP 4X18 X RAY DECT (DISPOSABLE) IMPLANT
SPONGE SURGIFOAM ABS GEL 100 (HEMOSTASIS) ×2 IMPLANT
STAPLER VISISTAT (STAPLE) ×1 IMPLANT
STRIP CLOSURE SKIN 1/2X4 (GAUZE/BANDAGES/DRESSINGS) IMPLANT
SUT NURALON 4 0 TR CR/8 (SUTURE) IMPLANT
SUT PROLENE 3 0 PS 2 (SUTURE) ×1 IMPLANT
SUT VIC AB 1 CT1 27 (SUTURE) ×6
SUT VIC AB 1 CT1 27XBRD ANTBC (SUTURE) IMPLANT
SUT VIC AB 1-0 CT2 27 (SUTURE) IMPLANT
SUT VIC AB 2-0 CT1 27 (SUTURE) ×4
SUT VIC AB 2-0 CT1 TAPERPNT 27 (SUTURE) IMPLANT
SUT VIC AB 2-0 CT2 27 (SUTURE) IMPLANT
SYR 3ML LL SCALE MARK (SYRINGE) IMPLANT
TOWEL OR 17X26 10 PK STRL BLUE (TOWEL DISPOSABLE) ×2 IMPLANT
TOWEL OR NON WOVEN STRL DISP B (DISPOSABLE) ×2 IMPLANT
YANKAUER SUCT BULB TIP NO VENT (SUCTIONS) ×2 IMPLANT

## 2017-02-24 NOTE — Anesthesia Preprocedure Evaluation (Signed)
Anesthesia Evaluation  Patient identified by MRN, date of birth, ID band Patient awake    Reviewed: Allergy & Precautions, NPO status , Patient's Chart, lab work & pertinent test results  Airway Mallampati: II  TM Distance: >3 FB Neck ROM: Full    Dental no notable dental hx.    Pulmonary neg pulmonary ROS, former smoker,    Pulmonary exam normal breath sounds clear to auscultation       Cardiovascular hypertension, + CAD, + Past MI and + Cardiac Stents  Normal cardiovascular exam Rhythm:Regular Rate:Normal     Neuro/Psych negative neurological ROS  negative psych ROS   GI/Hepatic negative GI ROS, Neg liver ROS,   Endo/Other  diabetes  Renal/GU negative Renal ROS  negative genitourinary   Musculoskeletal negative musculoskeletal ROS (+)   Abdominal   Peds negative pediatric ROS (+)  Hematology negative hematology ROS (+)   Anesthesia Other Findings   Reproductive/Obstetrics negative OB ROS                             Anesthesia Physical Anesthesia Plan  ASA: III  Anesthesia Plan: General   Post-op Pain Management:    Induction: Intravenous  PONV Risk Score and Plan: 2 and Ondansetron and Dexamethasone  Airway Management Planned: Oral ETT  Additional Equipment:   Intra-op Plan:   Post-operative Plan: Extubation in OR  Informed Consent: I have reviewed the patients History and Physical, chart, labs and discussed the procedure including the risks, benefits and alternatives for the proposed anesthesia with the patient or authorized representative who has indicated his/her understanding and acceptance.   Dental advisory given  Plan Discussed with: CRNA and Surgeon  Anesthesia Plan Comments:         Anesthesia Quick Evaluation

## 2017-02-24 NOTE — Transfer of Care (Signed)
Immediate Anesthesia Transfer of Care Note  Patient: [name]  Procedure(s) Performed: Microlumbar decompression L4-5, L5-S1 (N/A )  Patient Location: PACU  Anesthesia Type:General  Level of Consciousness: awake, alert , oriented and patient cooperative  Airway & Oxygen Therapy: Patient Spontanous Breathing and Patient connected to face mask oxygen  Post-op Assessment: Report given to RN, Post -op Vital signs reviewed and stable and Patient moving all extremities X 4  Post vital signs: stable  Last Vitals:  Vitals:   02/24/17 0624  BP: (!) 144/82  Pulse: 70  Resp: 18  Temp: 36.7 C  SpO2: 95%    Last Pain:  Vitals:   02/24/17 0624  TempSrc: Oral  PainSc:       Patients Stated Pain Goal: 4 (93/96/88 6484)  Complications: No apparent anesthesia complications

## 2017-02-24 NOTE — Anesthesia Procedure Notes (Signed)
Procedure Name: Intubation Date/Time: 02/24/2017 7:36 AM Performed by: Lissa Morales, CRNA Pre-anesthesia Checklist: Patient identified, Emergency Drugs available, Suction available and Patient being monitored Patient Re-evaluated:Patient Re-evaluated prior to induction Oxygen Delivery Method: Circle system utilized Preoxygenation: Pre-oxygenation with 100% oxygen Induction Type: IV induction Ventilation: Mask ventilation without difficulty Laryngoscope Size: Mac and 4 Grade View: Grade II Tube type: Oral Tube size: 7.5 mm Number of attempts: 1 Airway Equipment and Method: Stylet and Oral airway Placement Confirmation: ETT inserted through vocal cords under direct vision,  positive ETCO2 and breath sounds checked- equal and bilateral Secured at: 22 cm Tube secured with: Tape Dental Injury: Teeth and Oropharynx as per pre-operative assessment

## 2017-02-24 NOTE — Discharge Instructions (Signed)

## 2017-02-24 NOTE — Brief Op Note (Signed)
02/24/2017  9:35 AM  PATIENT:  [name]  77 y.o. male  PRE-OPERATIVE DIAGNOSIS:  Spinal Stenosis  POST-OPERATIVE DIAGNOSIS:  Spinal Stenosis  PROCEDURE:  Procedure(s) with comments: Microlumbar decompression L4-5, L5-S1 (N/A) - 120 mins  SURGEON:  Surgeon(s) and Role:    Susa Day, MD - Primary  PHYSICIAN ASSISTANT:   ASSISTANTS: [place]   ANESTHESIA:   general  EBL:  250 mL   BLOOD ADMINISTERED:none  DRAINS: none   LOCAL MEDICATIONS USED:  MARCAINE     SPECIMEN:  No Specimen  DISPOSITION OF SPECIMEN:  N/A  COUNTS:  YES  TOURNIQUET:  * No tourniquets in log *  DICTATION: .Other Dictation: Dictation Number   [id number]  PLAN OF CARE: Admit for overnight observation  PATIENT DISPOSITION:  PACU - hemodynamically stable.   Delay start of Pharmacological VTE agent (>24hrs) due to surgical blood loss or risk of bleeding: yes

## 2017-02-24 NOTE — Interval H&P Note (Signed)
History and Physical Interval Note:  02/24/2017 7:18 AM  [name]  has presented today for surgery, with the diagnosis of Spinal Stenosis  The various methods of treatment have been discussed with the patient and family. After consideration of risks, benefits and other options for treatment, the patient has consented to  Procedure(s) with comments: Microlumbar decompression L4-5, possible L3-4, L5-S1 (N/A) - 120 mins as a surgical intervention .  The patient's history has been reviewed, patient examined, no change in status, stable for surgery.  I have reviewed the patient's chart and labs.  Questions were answered to the patient's satisfaction.     Jessen Siegman C

## 2017-02-24 NOTE — Plan of Care (Signed)
initiated

## 2017-02-24 NOTE — Evaluation (Signed)
Physical Therapy Evaluation Patient Details Name: [name] MRN: [id number] DOB: 27-Jun-1940 Today's Date: 02/24/2017   History of Present Illness  Pt s/p L4-5 L5-S1 micro-lumbar decompression and with hx of Bil TKR, R THR, and DM  Clinical Impression  Pt s/p back surgery and presents with functional mobility limitations 2* post op pain and back precautions.  Pt should progress to dc home with family assist.    Follow Up Recommendations No PT follow up    Equipment Recommendations  None recommended by PT    Recommendations for Other Services OT consult     Precautions / Restrictions Precautions Precautions: Back Precaution Booklet Issued: Yes (comment) Restrictions Weight Bearing Restrictions: No      Mobility  Bed Mobility               General bed mobility comments: Pt up on EOB on arrival to room  Transfers Overall transfer level: Needs assistance   Transfers: Sit to/from Stand Sit to Stand: Min guard         General transfer comment: cues for adherence to back precautions and use of UEs to self assist  Ambulation/Gait Ambulation/Gait assistance: Min guard Ambulation Distance (Feet): 222 Feet Assistive device: Rolling walker (2 wheeled) Gait Pattern/deviations: Step-through pattern;Decreased step length - right;Decreased step length - left;Shuffle Gait velocity: decr Gait velocity interpretation: Below normal speed for age/gender General Gait Details: cues for posture and position from RW.  Pt states walking with RW more comfortable than sans AD  Stairs            Wheelchair Mobility    Modified Rankin (Stroke Patients Only)       Balance Overall balance assessment: Needs assistance Sitting-balance support: No upper extremity supported;Feet supported Sitting balance-Leahy Scale: Good     Standing balance support: No upper extremity supported Standing balance-Leahy Scale: Fair                                Pertinent Vitals/Pain Pain Assessment: 0-10 Pain Score: 5  Pain Location: back surgery site Pain Descriptors / Indicators: Aching;Sore Pain Intervention(s): Limited activity within patient's tolerance;Monitored during session;Patient requesting pain meds-RN notified    Home Living Family/patient expects to be discharged to:: Private residence Living Arrangements: Spouse/significant other Available Help at Discharge: Family Type of Home: House       Home Layout: One level Home Equipment: Environmental consultant - 2 wheels;Bedside commode      Prior Function Level of Independence: Independent               Hand Dominance        Extremity/Trunk Assessment   Upper Extremity Assessment Upper Extremity Assessment: Overall WFL for tasks assessed    Lower Extremity Assessment Lower Extremity Assessment: Overall WFL for tasks assessed       Communication   Communication: No difficulties  Cognition Arousal/Alertness: Awake/alert Behavior During Therapy: WFL for tasks assessed/performed Overall Cognitive Status: Within Functional Limits for tasks assessed                                        General Comments      Exercises     Assessment/Plan    PT Assessment Patient needs continued PT services  PT Problem List Decreased activity tolerance;Decreased mobility;Decreased balance;Decreased knowledge of use of DME;Pain;Decreased knowledge of precautions  PT Treatment Interventions DME instruction;Gait training;Stair training;Functional mobility training;Therapeutic activities;Therapeutic exercise;Patient/family education    PT Goals (Current goals can be found in the Care Plan section)  Acute Rehab PT Goals Patient Stated Goal: Regain IND PT Goal Formulation: With patient Time For Goal Achievement: 02/26/17 Potential to Achieve Goals: Good    Frequency 7X/week   Barriers to discharge        Co-evaluation               AM-PAC PT "6  Clicks" Daily Activity  Outcome Measure Difficulty turning over in bed (including adjusting bedclothes, sheets and blankets)?: A Lot Difficulty moving from lying on back to sitting on the side of the bed? : A Lot Difficulty sitting down on and standing up from a chair with arms (e.g., wheelchair, bedside commode, etc,.)?: A Lot Help needed moving to and from a bed to chair (including a wheelchair)?: A Little Help needed walking in hospital room?: A Little Help needed climbing 3-5 steps with a railing? : A Little 6 Click Score: 15    End of Session   Activity Tolerance: Patient tolerated treatment well Patient left: in chair;with call bell/phone within reach;with family/visitor present Nurse Communication: Mobility status PT Visit Diagnosis: Difficulty in walking, not elsewhere classified (R26.2)    Time: 3716-9678 PT Time Calculation (min) (ACUTE ONLY): 25 min   Charges:   PT Evaluation $PT Eval Low Complexity: 1 Low     PT G Codes:        Pg [phone]   Yasmin Bronaugh 02/24/2017, 4:15 PM

## 2017-02-24 NOTE — Anesthesia Postprocedure Evaluation (Signed)
Anesthesia Post Note  Patient: [name]  Procedure(s) Performed: Microlumbar decompression L4-5, L5-S1 (N/A )     Patient location during evaluation: PACU Anesthesia Type: General Level of consciousness: awake and alert Pain management: pain level controlled Vital Signs Assessment: post-procedure vital signs reviewed and stable Respiratory status: spontaneous breathing, nonlabored ventilation, respiratory function stable and patient connected to nasal cannula oxygen Cardiovascular status: blood pressure returned to baseline and stable Postop Assessment: no apparent nausea or vomiting Anesthetic complications: no    Last Vitals:  Vitals:   02/24/17 1047 02/24/17 1051  BP:    Pulse: 78 76  Resp: 16 10  Temp:    SpO2: 94% (P) 93%    Last Pain:  Vitals:   02/24/17 1051  TempSrc:   PainSc: (P) 7                  Ravynn Hogate S

## 2017-02-25 ENCOUNTER — Encounter (HOSPITAL_COMMUNITY): Payer: Self-pay | Admitting: Specialist

## 2017-02-25 DIAGNOSIS — Z79899 Other long term (current) drug therapy: Secondary | ICD-10-CM | POA: Diagnosis not present

## 2017-02-25 DIAGNOSIS — I252 Old myocardial infarction: Secondary | ICD-10-CM | POA: Diagnosis not present

## 2017-02-25 DIAGNOSIS — M48061 Spinal stenosis, lumbar region without neurogenic claudication: Secondary | ICD-10-CM | POA: Diagnosis present

## 2017-02-25 DIAGNOSIS — E119 Type 2 diabetes mellitus without complications: Secondary | ICD-10-CM | POA: Diagnosis not present

## 2017-02-25 DIAGNOSIS — K219 Gastro-esophageal reflux disease without esophagitis: Secondary | ICD-10-CM | POA: Diagnosis not present

## 2017-02-25 DIAGNOSIS — Z7982 Long term (current) use of aspirin: Secondary | ICD-10-CM | POA: Diagnosis not present

## 2017-02-25 DIAGNOSIS — M48062 Spinal stenosis, lumbar region with neurogenic claudication: Secondary | ICD-10-CM | POA: Diagnosis not present

## 2017-02-25 DIAGNOSIS — Z87891 Personal history of nicotine dependence: Secondary | ICD-10-CM | POA: Diagnosis not present

## 2017-02-25 DIAGNOSIS — Z794 Long term (current) use of insulin: Secondary | ICD-10-CM | POA: Diagnosis not present

## 2017-02-25 DIAGNOSIS — E785 Hyperlipidemia, unspecified: Secondary | ICD-10-CM | POA: Diagnosis not present

## 2017-02-25 DIAGNOSIS — I10 Essential (primary) hypertension: Secondary | ICD-10-CM | POA: Diagnosis not present

## 2017-02-25 DIAGNOSIS — I2510 Atherosclerotic heart disease of native coronary artery without angina pectoris: Secondary | ICD-10-CM | POA: Diagnosis not present

## 2017-02-25 DIAGNOSIS — M4807 Spinal stenosis, lumbosacral region: Secondary | ICD-10-CM | POA: Diagnosis not present

## 2017-02-25 LAB — CBC
HCT: 37.3 % — ABNORMAL LOW (ref 39.0–52.0)
Hemoglobin: 12.6 g/dL — ABNORMAL LOW (ref 13.0–17.0)
MCH: 30.1 pg (ref 26.0–34.0)
MCHC: 33.8 g/dL (ref 30.0–36.0)
MCV: 89 fL (ref 78.0–100.0)
PLATELETS: 224 10*3/uL (ref 150–400)
RBC: 4.19 MIL/uL — AB (ref 4.22–5.81)
RDW: 13.8 % (ref 11.5–15.5)
WBC: 10.6 10*3/uL — AB (ref 4.0–10.5)

## 2017-02-25 LAB — BASIC METABOLIC PANEL
Anion gap: 11 (ref 5–15)
BUN: 15 mg/dL (ref 6–20)
CALCIUM: 8.5 mg/dL — AB (ref 8.9–10.3)
CO2: 25 mmol/L (ref 22–32)
CREATININE: 1.05 mg/dL (ref 0.61–1.24)
Chloride: 101 mmol/L (ref 101–111)
GFR calc non Af Amer: >60 mL/min (ref >60)
Glucose, Bld: 243 mg/dL — ABNORMAL HIGH (ref 65–99)
Potassium: 3.7 mmol/L (ref 3.5–5.1)
SODIUM: 137 mmol/L (ref 135–145)

## 2017-02-25 LAB — GLUCOSE, CAPILLARY: Glucose-Capillary: 222 mg/dL — ABNORMAL HIGH (ref 65–99)

## 2017-02-25 MED ORDER — ASPIRIN 81 MG PO TABS: 81.0000 mg | ORAL_TABLET | Freq: Every evening | ORAL | Status: DC

## 2017-02-25 MED ORDER — ASPIRIN-ACETAMINOPHEN-CAFFEINE 250-250-65 MG PO TABS: 1.0000 | ORAL_TABLET | Freq: Every day | ORAL | 0 refills | Status: DC | PRN

## 2017-02-25 MED ORDER — TRAZODONE HCL 50 MG PO TABS: 50.0000 mg | ORAL_TABLET | Freq: Every day | ORAL | Status: DC

## 2017-02-25 MED ORDER — TRAZODONE HCL 50 MG PO TABS
50.0000 mg | ORAL_TABLET | Freq: Every evening | ORAL | Status: DC | PRN
  Administered 2017-02-25: 50 mg via ORAL
  Filled 2017-02-25: qty 1

## 2017-02-25 NOTE — Care Management Obs Status (Signed)
[place] NOTIFICATION   Patient Details  Name: [name] MRN: [id number] Date of Birth: December 26, 1940   Medicare Observation Status Notification Given:  Yes    Guadalupe Maple, RN 02/25/2017, 9:50 AM

## 2017-02-25 NOTE — Evaluation (Signed)
Occupational Therapy Evaluation Patient Details Name: [name] MRN: [id number] DOB: 05-15-1940 Today's Date: 02/25/2017    History of Present Illness Pt s/p L4-5 L5-S1 micro-lumbar decompression and with hx of Bil TKR, R THR, and DM   Clinical Impression   This 77 year old man was admitted for the above sx. Will follow up with wife to review precautions and assistance needed to follow these. Pt also needs reinforcement for bed mobility, following precautions    Follow Up Recommendations  Supervision/Assistance - 24 hour    Equipment Recommendations  None recommended by OT    Recommendations for Other Services       Precautions / Restrictions Precautions Precautions: Back Precaution Booklet Issued: Yes (comment) Restrictions Weight Bearing Restrictions: No      Mobility Bed Mobility Overal bed mobility: Needs Assistance Bed Mobility: Sidelying to Sit;Sit to Sidelying;Rolling Rolling: Min assist Sidelying to sit: Min assist     Sit to sidelying: Min guard General bed mobility comments: assist to avoid twisting  Transfers   Equipment used: Rolling walker (2 wheeled)   Sit to Stand: Min assist         General transfer comment: cues for hand placement    Balance                                           ADL either performed or assessed with clinical judgement   ADL Overall ADL's : Needs assistance/impaired     Grooming: Oral care;Supervision/safety;Standing(cues for back precautions)       Lower Body Bathing: Moderate assistance;Sit to/from stand       Lower Body Dressing: Maximal assistance;Sit to/from stand   Toilet Transfer: Minimal assistance;Ambulation;BSC;RW   Toileting- Clothing Manipulation and Hygiene: Minimal assistance;Sit to/from stand         General ADL Comments: pt can perform UB adls with set up/supervision.  Cues for back precautions. Reviewed these with him and adaptations     Vision          Perception     Praxis      Pertinent Vitals/Pain Pain Assessment: Faces Faces Pain Scale: Hurts even more Pain Location: back surgery site Pain Descriptors / Indicators: Aching;Sore Pain Intervention(s): Limited activity within patient's tolerance;Monitored during session;Premedicated before session;Repositioned     Hand Dominance     Extremity/Trunk Assessment Upper Extremity Assessment Upper Extremity Assessment: Overall WFL for tasks assessed           Communication Communication Communication: No difficulties   Cognition Arousal/Alertness: Awake/alert Behavior During Therapy: WFL for tasks assessed/performed                                   General Comments: needed multimodal cues at times to follow precautions   General Comments       Exercises     Shoulder Instructions      Home Living Family/patient expects to be discharged to:: Private residence Living Arrangements: Spouse/significant other Available Help at Discharge: Family               Bathroom Shower/Tub: Walk-in Psychologist, prison and probation services: Standard     Home Equipment: Environmental consultant - 2 wheels;Bedside commode;Shower seat          Prior Functioning/Environment Level of Independence: Independent  OT Problem List: Decreased strength;Decreased knowledge of precautions;Pain;Decreased knowledge of use of DME or AE      OT Treatment/Interventions: Self-care/ADL training;DME and/or AE instruction;Patient/family education    OT Goals(Current goals can be found in the care plan section) Acute Rehab OT Goals Patient Stated Goal: Regain IND OT Goal Formulation: With patient Time For Goal Achievement: 03/11/17 Potential to Achieve Goals: Good ADL Goals Additional ADL Goal #1: wife will verbalize understanding of assistance needed by pt for adls (vs cue him for adaptations) Additional ADL Goal #2: pt will perform bed mobility at supervision level in preparation  for adls  OT Frequency: Min 2X/week   Barriers to D/C:            Co-evaluation              AM-PAC PT "6 Clicks" Daily Activity     Outcome Measure Help from another person eating meals?: None Help from another person taking care of personal grooming?: A Little Help from another person toileting, which includes using toliet, bedpan, or urinal?: A Little Help from another person bathing (including washing, rinsing, drying)?: A Lot Help from another person to put on and taking off regular upper body clothing?: A Little Help from another person to put on and taking off regular lower body clothing?: A Lot 6 Click Score: 17   End of Session    Activity Tolerance: Patient tolerated treatment well Patient left: in chair;with call bell/phone within reach;with chair alarm set  OT Visit Diagnosis: Muscle weakness (generalized) (M62.81)                Time: 1478-2956 OT Time Calculation (min): 30 min Charges:  OT General Charges $OT Visit: 1 Visit OT Evaluation $OT Eval Low Complexity: 1 Low OT Treatments $Self Care/Home Management : 8-22 mins G-Codes:     [place], OTR/L [phone] 02/25/2017  [name] 02/25/2017, 9:11 AM

## 2017-02-25 NOTE — Progress Notes (Signed)
Ot Note:  Touched base with wife. She feels comfortable with assisting pt and following back precautions. Will sign off.  [place], OTR/L [phone] 02/25/2017

## 2017-02-25 NOTE — Op Note (Signed)
NAMEYOHAN, SAMONS            ACCOUNT NO.:  [id number]  MEDICAL RECORD NO.:  [id number]  LOCATION:                                 FACILITY:  PHYSICIAN:  Susa Day, M.D.         DATE OF BIRTH:  DATE OF PROCEDURE:  02/24/2017 DATE OF DISCHARGE:                              OPERATIVE REPORT   PREOPERATIVE DIAGNOSIS:  Spinal stenosis at L4-5 and L5-S1.  POSTOPERATIVE DIAGNOSIS:  Spinal stenosis at L4-5 and L5-S1.  PROCEDURE PERFORMED: 1. Microlumbar decompression L4-5, L5-S1. 2. Foraminotomies, L4, L5, S1. 3. Bilateral hemilaminotomies, L4-5, L5-S1.  ANESTHESIA:  General.  ASSISTANT:  Lacie Draft, PA.  HISTORY:  A 77 year old with bilateral lower extremity radicular pain, left greater than right, secondary to severe spinal stenosis refractory to conservative treatment, epidural steroid injections.  MRI indicating stenosis of L4-5 and L5-S1 lateral recess.  He was indicated for microlumbar decompression and two possible 3 levels.  Risks and benefits were discussed including bleeding, infection, damage to the neurovascular structures, no changes in symptoms, worsening symptoms, DVT, PE, anesthetic complications, etc.  TECHNIQUE:  With the patient in supine position after induction of adequate general anesthesia, 2 g of Kefzol, placed prone on the [place] frame.  All bony prominences were well padded.  Lumbar region was prepped and draped in usual sterile fashion.  Two 18-gauge spinal needles were utilized to localize L4-5 and L5-S1 interspace.  Incision was made from the spinous process to above 4 to below 5.  Subcutaneous tissue was dissected.  Electrocautery was utilized to achieve hemostasis.  Marcaine 0.25% with epinephrine was infiltrated in the paraspinous musculature.  The dorsolumbar fascia was divided in line with skin incision.  Paraspinous muscle elevated from lamina of L4-5 and L5-S1.  Confirmatory radiograph obtained.  Operating microscope was draped  and brought on the surgical field.  Leksell rongeur was utilized to remove the spinous processes of L4 and L5.  We started first at L5- S1.  We performed hemilaminotomies of the caudad edge of L5 for centrally and then laterally detaching the ligamentum flavum.  Neuro patty was placed beneath the ligamentum flavum and continued cephalad. We removed the neural arch with neuro patties beneath the lamina.  There were severe multifactorial stenoses noted under the arch of L4 and at L4- 5 due to ligamentum flavum and facet hypertrophy.  After the removal of the neural arch of L5, we performed hemilaminotomies of the caudad edge of L4 bilaterally detaching the ligamentum flavum.  There were significant adhesions into the lateral recess and attenuation of the thecal sac.  I decompressed the lateral recess to the medial border of the pedicle bilaterally utilizing a 2-0 microcurette and 2-mm Kerrison. There was an attenuation of the dura on the right noted.  No active CSF leakage.  I had performed foraminotomies of L4 and L5 bilaterally. Severe stenosis was noted at these levels.  No disk herniation was noted.  I placed a small DuraGen patch over the area of attenuation measuring approximately 1 cm x 0.5 cm.  We then continued our decompression distally.  He had severe lateral recess stenosis bilaterally at L5-S1 and significant adhesions of the dura on the left  at L5-S1.  With a Youth worker and a Penfield, we meticulously developed the plane between the thecal sac, scar tissue and the facet. We were able to do so without difficulty.  Protecting the nerve root, we then decompressed the lateral recess to the medial border of the pedicle performing foraminotomies of S1.  No disk herniation was noted here as well.  Bipolar electrocautery was utilized to achieve hemostasis as was thrombin-soaked Gelfoam and bone wax.  Confirmatory radiograph obtained with Penfield in the foramen above the pedicle  of L4 and below the pedicle of S1.  Good restoration of the thecal sac.  Neural probe passed freely at the foramens of L4, L5 and S1 bilaterally.  We performed a Valsalva maneuver to 40 mmHg without evidence of CSF leakage.  Placed thrombin-soaked Gelfoam over the laminotomy defect after copious irrigation with antibiotic irrigation.  I removed the McCullough retractor, no evidence of active bleeding.  Paraspinous musculature irrigated with antibiotic irrigation.  Therefore, I then closed the dorsolumbar fascia with #1 Vicryl, subcu with 2-0, and skin with staples.  Wound was dressed sterilely.  Placed supine on the hospital bed, extubated without difficulty and transported to the recovery room in satisfactory condition.  The patient tolerated the procedure well.  No complications.  Assistant, Lacie Draft, [place].  Blood loss 200 cc.     Susa Day, M.D.     Geralynn Rile  D:  02/24/2017  T:  02/25/2017  Job:  [id number]

## 2017-02-25 NOTE — Care Management CC44 (Signed)
Condition Code 44 Documentation Completed  Patient Details  Name: [name] MRN: [id number] Date of Birth: July 31, 1940   Condition Code 44 given:  Yes Patient signature on Condition Code 44 notice:  Yes Documentation of 2 MD's agreement:  Yes Code 44 added to claim:  Yes    Guadalupe Maple, RN 02/25/2017, 9:50 AM

## 2017-02-25 NOTE — Progress Notes (Signed)
Subjective: 1 Day Post-Op Procedure(s) (LRB): Microlumbar decompression L4-5, L5-S1 (N/A) Patient reports pain as moderate.  Reports incisional back pain. Still some L lateral hip pain but improved. No R hip pain. Legs otherwise feeling good. Has not voided yet since foley was removed shortly before my arrival. Norco seems to be working relatively well for pain control. Would like to go home today.  Objective: Vital signs in last 24 hours: Temp:  [97.5 F (36.4 C)-99 F (37.2 C)] 98.3 F (36.8 C) (02/15 0518) Pulse Rate:  [67-88] 67 (02/15 0518) Resp:  [5-19] 18 (02/15 0518) BP: (110-131)/(52-85) 123/53 (02/15 0518) SpO2:  [92 %-99 %] 95 % (02/15 0518) Weight:  [97.5 kg (215 lb)] 97.5 kg (215 lb) (02/14 1230)  Intake/Output from previous day: 02/14 0701 - 02/15 0700 In: 3817.5 [P.O.:620; I.V.:2797.5; IV Piggyback:400] Out: 2800 [Urine:2550; Blood:250] Intake/Output this shift: Total I/O In: 480 [P.O.:480] Out: -   Recent Labs    02/25/17 0529  HGB 12.6*   Recent Labs    02/25/17 0529  WBC 10.6*  RBC 4.19*  HCT 37.3*  PLT 224   Recent Labs    02/25/17 0529  NA 137  K 3.7  CL 101  CO2 25  BUN 15  CREATININE 1.05  GLUCOSE 243*  CALCIUM 8.5*   No results for input(s): LABPT, INR in the last 72 hours.  Neurologically intact ABD soft Neurovascular intact Sensation intact distally Intact pulses distally Dorsiflexion/Plantar flexion intact Incision: dressing C/D/I and no drainage No cellulitis present Compartment soft no calf pain or sign of DVT  Assessment/Plan: 1 Day Post-Op Procedure(s) (LRB): Microlumbar decompression L4-5, L5-S1 (N/A) Advance diet Up with therapy D/C IV fluids  Discussed LSpine precautions, dressing instructions, D/C instructions D/C home today after PT/OT as long as voiding without difficulty and pain remains controlled  BISSELL, JACLYN M. 02/25/2017, 8:40 AM

## 2017-02-25 NOTE — Discharge Summary (Signed)
Physician Discharge Summary   Patient ID: [name] MRN: [id number] DOB/AGE: 77/21/1942 77 y.o.  Admit date: 02/24/2017 Discharge date: 02/25/2017  Primary Diagnosis:   Spinal Stenosis  Admission Diagnoses:  Past Medical History:  Diagnosis Date  . Anxiety disorder   . Arthritis   . Chronic headaches   . Coronary atherosclerosis of native coronary artery cardiologist-- dr Domenic Polite   BMS nondominant RCA 12/2004  . Essential hypertension, benign   . GERD (gastroesophageal reflux disease)   . History of adenomatous polyp of colon   . History of kidney stones   . History of MI (myocardial infarction) 12/2004   s/p  BMS to RCA  . Hyperlipidemia   . Internal hemorrhoids   . Nocturia more than twice per night   . Numbness of left foot   . S/p bare metal coronary artery stent 12/2004   BMS x1 to RCA  . Spinal stenosis   . Type 2 diabetes mellitus treated with insulin ([place])   . Urgency of urination   . Wears dentures    upper   Discharge Diagnoses:   Principal Problem:   Spinal stenosis of lumbar region Active Problems:   Spinal stenosis at L4-L5 level   Lumbar spinal stenosis  Procedure:  Procedure(s) (LRB): Microlumbar decompression L4-5, L5-S1 (N/A)   Consults: None  HPI:  see H&P    Laboratory Data: Hospital Outpatient Visit on 02/16/2017  Component Date Value Ref Range Status  . Sodium 02/16/2017 136  135 - 145 mmol/L Final  . Potassium 02/16/2017 3.9  3.5 - 5.1 mmol/L Final  . Chloride 02/16/2017 98* 101 - 111 mmol/L Final  . CO2 02/16/2017 27  22 - 32 mmol/L Final  . Glucose, Bld 02/16/2017 134* 65 - 99 mg/dL Final  . BUN 02/16/2017 16  6 - 20 mg/dL Final  . Creatinine, Ser 02/16/2017 1.01  0.61 - 1.24 mg/dL Final  . Calcium 02/16/2017 9.6  8.9 - 10.3 mg/dL Final  . GFR calc non Af Amer 02/16/2017 >60  >60 mL/min Final  . GFR calc Af Amer 02/16/2017 >60  >60 mL/min Final   Comment: (NOTE) The eGFR has been calculated using the CKD EPI  equation. This calculation has not been validated in all clinical situations. eGFR's persistently <60 mL/min signify possible Chronic Kidney Disease.   Georgiann Hahn gap 02/16/2017 11  5 - 15 Final   Performed at [facility], [place] [address] [place], [place] [address]  . WBC 02/16/2017 7.6  4.0 - 10.5 K/uL Final  . RBC 02/16/2017 5.18  4.22 - 5.81 MIL/uL Final  . Hemoglobin 02/16/2017 16.2  13.0 - 17.0 g/dL Final  . HCT 02/16/2017 46.9  39.0 - 52.0 % Final  . MCV 02/16/2017 90.5  78.0 - 100.0 fL Final  . MCH 02/16/2017 31.3  26.0 - 34.0 pg Final  . MCHC 02/16/2017 34.5  30.0 - 36.0 g/dL Final  . RDW 02/16/2017 13.5  11.5 - 15.5 % Final  . Platelets 02/16/2017 256  150 - 400 K/uL Final   Performed at [facility], [place] [address], [place], [place] [address]  . MRSA, PCR 02/16/2017 NEGATIVE  NEGATIVE Final  . Staphylococcus aureus 02/16/2017 POSITIVE* NEGATIVE Final   Comment: (NOTE) The Xpert SA Assay (FDA approved for NASAL specimens in patients 77 years of age and older), is one component of a comprehensive surveillance program. It is not intended to diagnose infection nor to guide or monitor treatment. Performed at [facility]  [place] [address], [place], [place] [address]   . Glucose-Capillary 02/16/2017 145* 65 - 99 mg/dL Final   Recent Labs    02/25/17 0529  HGB 12.6*   Recent Labs    02/25/17 0529  WBC 10.6*  RBC 4.19*  HCT 37.3*  PLT 224   Recent Labs    02/25/17 0529  NA 137  K 3.7  CL 101  CO2 25  BUN 15  CREATININE 1.05  GLUCOSE 243*  CALCIUM 8.5*   No results for input(s): LABPT, INR in the last 72 hours.  X-Rays:Dg Lumbar Spine 2-3 Views  Result Date: 02/16/2017 CLINICAL DATA:  Preop for lumbar spine surgery L4-5 and possibly L3-4. EXAM: LUMBAR SPINE - 2-3 VIEW COMPARISON:  Lumbar spine films of 12/06/2014 and CT abdomen pelvis of 06/03/2012 FINDINGS: The lumbar vertebrae are in normal alignment. The  L4-5 disc space is very slightly narrowed. There is diffuse anterior osteophyte formation present. No compression deformity is seen. IMPRESSION: Normal alignment with diffuse anterior osteophyte formation. No compression deformity. Very slight loss of disc space at L4-5 Electronically Signed   By: Ivar Drape M.D.   On: 02/16/2017 14:26   Dg Spine Portable 1 View  Result Date: 02/24/2017 CLINICAL DATA:  Intraoperative radiograph. EXAM: PORTABLE SPINE - 1 VIEW COMPARISON:  02/16/2017 FINDINGS: Intraoperative radiograph with surgical instruments at the posterior aspect of L4 inferior endplate and S1 superior endplate. Osteoarthritic changes with exuberant osteophytosis. Calcific atherosclerotic disease of the aorta. IMPRESSION: Intraoperative radiograph with surgical instruments at the posterior aspect of L4 inferior endplate and S1 superior endplate. Electronically Signed   By: Fidela Salisbury M.D.   On: 02/24/2017 09:28   Dg Spine Portable 1 View  Result Date: 02/24/2017 CLINICAL DATA:  Laminectomy EXAM: PORTABLE SPINE - 1 VIEW COMPARISON:  Study obtained earlier in the day FINDINGS: Cross-table lateral lumbar image labeled #2 submitted. Metallic probe tips are posterior to the L4-5 and L5-S1 interspaces respectively. Cutting tool overlies the L5 spinous process. No fracture or spondylolisthesis. There are multiple anterior osteophytes. There is aortoiliac atherosclerosis. IMPRESSION: Metallic probe tips are posterior to the L4-5 and L5-S1 interspaces respectively with cutting tool overlying the L5 spinous process. Multiple anterior osteophytes noted. There is aortoiliac atherosclerosis. Aortic Atherosclerosis (ICD10-I70.0). Electronically Signed   By: Lowella Grip III M.D.   On: 02/24/2017 08:22   Dg Spine Portable 1 View  Result Date: 02/24/2017 CLINICAL DATA:  Back surgery.  Surgical localization EXAM: PORTABLE SPINE - 1 VIEW COMPARISON:  02/16/2017 FINDINGS: Single lateral view in the  operating room of the lumbar spine was obtained. Needle directed at the spinous process of L4 Needle directed at the spinous process of L5 IMPRESSION: Localization of L4 and L5 spinous processes. Electronically Signed   By: Franchot Gallo M.D.   On: 02/24/2017 08:08    EKG: Orders placed or performed in visit on 03/17/16  . EKG 12-Lead     Hospital Course: Patient was admitted to [facility] and taken to the OR and underwent the above state procedure without complications.  Patient tolerated the procedure well and was later transferred to the recovery room and then to the orthopaedic floor for postoperative care.  They were given PO and IV analgesics for pain control following their surgery.  They were given 24 hours of postoperative antibiotics.   PT was consulted postop to assist with mobility and transfers.  The patient was allowed to be WBAT with therapy and was taught back  precautions. Discharge planning was consulted to help with postop disposition and equipment needs.  Patient had a good night on the evening of surgery and started to get up OOB with therapy on day one. Patient was seen in rounds and was ready to go home on day one.  They were given discharge instructions and dressing directions.  They were instructed on when to follow up in the office with Dr. Tonita Cong.   Diet: Diabetic diet Activity:WBAT Follow-up:in 10-14 days Disposition - Home Discharged Condition: good   Discharge Instructions    Call MD / Call 911   Complete by:  As directed    If you experience chest pain or shortness of breath, CALL 911 and be transported to the hospital emergency room.  If you develope a fever above 101 F, pus (white drainage) or increased drainage or redness at the wound, or calf pain, call your surgeon's office.   Constipation Prevention   Complete by:  As directed    Drink plenty of fluids.  Prune juice may be helpful.  You may use a stool softener, such as Colace (over the counter)  100 mg twice a day.  Use MiraLax (over the counter) for constipation as needed.   Diet - low sodium heart healthy   Complete by:  As directed    Increase activity slowly as tolerated   Complete by:  As directed      Allergies as of 02/25/2017      Reactions   Ace Inhibitors Hives, Swelling, Rash   Rash,hives,tongue swelling   Angiotensin Receptor Blockers    Unknown reaction    Ciprofloxacin Other (See Comments)   confusion   Other Hives, Other (See Comments)   Altaseptic, altase (cough)   Oxycodone-acetaminophen Other (See Comments)   REACTION: unknown reaction   Ramipril Cough   Robaxin [methocarbamol] Other (See Comments)   Confusion    Toradol [ketorolac Tromethamine] Other (See Comments)   confusion   Valium Other (See Comments)   Hallucinations; confusion   Doxycycline Hives, Rash      Medication List    STOP taking these medications   atorvastatin 20 MG tablet Commonly known as:  LIPITOR   ONE TOUCH ULTRA SYSTEM KIT w/Device Kit   Turmeric 500 MG Caps     TAKE these medications   acetaminophen 650 MG CR tablet Commonly known as:  TYLENOL Take 1,300 mg by mouth 2 (two) times daily as needed for pain.   amLODipine 5 MG tablet Commonly known as:  NORVASC TAKE 1 TABLET BY MOUTH  DAILY What changed:    how much to take  how to take this  when to take this   aspirin 81 MG tablet Take 1 tablet (81 mg total) by mouth every evening. Resume 4 days post-op What changed:  additional instructions   aspirin-acetaminophen-caffeine 250-250-65 MG tablet Commonly known as:  EXCEDRIN EXTRA STRENGTH Take 1 tablet by mouth daily as needed (headaches). May resume 5 days post-op as needed What changed:    medication strength  how much to take  additional instructions   BLUE-EMU MAXIMUM STRENGTH EX Apply 1 application topically daily as needed (muscle pain).   butalbital-acetaminophen-caffeine 50-325-40 MG tablet Commonly known as:  FIORICET, ESGIC Take 1  tablet by mouth 2 (two) times daily as needed for headache.   cetirizine 10 MG tablet Commonly known as:  ZYRTEC Take 10 mg by mouth every evening.   chlorthalidone 25 MG tablet Commonly known as:  HYGROTON Take 0.5 tablets (  12.5 mg total) by mouth daily. as directed   docusate sodium 100 MG capsule Commonly known as:  COLACE Take 1 capsule (100 mg total) by mouth 2 (two) times daily.   Dulaglutide 1.5 MG/0.5ML Sopn Commonly known as:  TRULICITY Inject 1.5 mg into the skin once a week.   famotidine 20 MG tablet Commonly known as:  PEPCID TAKE 1 TABLET BY MOUTH TWO  TIMES DAILY   HUMALOG KWIKPEN 100 UNIT/ML KiwkPen Generic drug:  insulin lispro INJECT 0 TO 15 UNITS  SUBCUTANEOUSLY 3 TIMES  DAILY BEFORE MEALS  (70-150=8 UNITS, 151-200=9  UNITS) What changed:  See the new instructions.   HYDROcodone-acetaminophen 10-325 MG tablet Commonly known as:  NORCO Take 1 tablet by mouth every 4 (four) hours as needed for severe pain. What changed:    when to take this  reasons to take this   Insulin Pen Needle 31G X 8 MM Misc Commonly known as:  B-D ULTRAFINE III SHORT PEN USE 4 TIMES DAILY DX E11.49   LANTUS SOLOSTAR 100 UNIT/ML Solostar Pen Generic drug:  Insulin Glargine INJECT SUBCUTANEOUSLY 52  UNITS AT BEDTIME What changed:  See the new instructions.   losartan 50 MG tablet Commonly known as:  COZAAR TAKE 1 TABLET BY MOUTH  DAILY   metFORMIN 500 MG 24 hr tablet Commonly known as:  GLUCOPHAGE-XR TAKE 2 TABLETS BY MOUTH TWO TIMES DAILY   nitroGLYCERIN 0.4 MG SL tablet Commonly known as:  NITROSTAT DISSOLVE ONE TABLET UNDER THE TONGUE EVERY 5 MINUTES AS NEEDED FOR CHEST PAIN.  DO NOT EXCEED A TOTAL OF 3 DOSES IN 15 MINUTES   ONE TOUCH ULTRA TEST test strip Generic drug:  glucose blood USE ONE STRIP TO CHECK GLUCOSE 4 TIMES DAILY   PARoxetine 20 MG tablet Commonly known as:  PAXIL TAKE 1 TABLET BY MOUTH  DAILY What changed:    how much to take  how to take  this  when to take this   polyethylene glycol packet Commonly known as:  MIRALAX / GLYCOLAX Take 17 g by mouth daily.   tamsulosin 0.4 MG Caps capsule Commonly known as:  FLOMAX TAKE 1 CAPSULE BY MOUTH  DAILY What changed:    how much to take  how to take this  when to take this   Vitamin D3 2000 units Tabs Take 2,000 Units by mouth every morning.      Follow-up Information    Susa Day, MD Follow up in 2 week(s).   Specialty:  Orthopedic Surgery Contact information: [address] [place] [address] [phone]           Signed: Lacie Draft PA-C Orthopaedic Surgery 02/25/2017, 8:48 AM

## 2017-03-08 ENCOUNTER — Telehealth: Payer: Self-pay | Admitting: Family Medicine

## 2017-03-08 NOTE — Telephone Encounter (Signed)
Sample of trulicity is ready for pick up

## 2017-03-09 ENCOUNTER — Other Ambulatory Visit: Payer: Self-pay | Admitting: Family Medicine

## 2017-03-11 NOTE — Progress Notes (Signed)
Cardiology Office Note  Date: 03/14/2017   ID: [name], DOB 1940-08-16, MRN [id number]  PCP: Dettinger, Fransisca Kaufmann, MD  Primary Cardiologist: Rozann Lesches, MD   Chief Complaint  Patient presents with  . Coronary Artery Disease    History of Present Illness: [name] is a 77 y.o. male last seen in September 2018.  He presents for a routine follow-up visit.  States that he has been doing well, no active angina symptoms or increasing shortness of breath.  He recently had surgery for spinal stenosis, has done well from this perspective.  I reviewed his medications.  Cardiac regimen includes aspirin, Norvasc, chlorthalidone, Cozaar, and as needed nitroglycerin.  He has been off Lipitor, although it sounds like some of his prior symptoms were related more to his back trouble.  He underwent a follow-up Myoview study in 2017 which was low risk as outlined below.  Past Medical History:  Diagnosis Date  . Anxiety disorder   . Arthritis   . Chronic headaches   . Coronary atherosclerosis of native coronary artery    BMS nondominant RCA 12/2004  . Essential hypertension, benign   . GERD (gastroesophageal reflux disease)   . History of adenomatous polyp of colon   . History of kidney stones   . History of MI (myocardial infarction) 12/2004   s/p  BMS to RCA  . Hyperlipidemia   . Internal hemorrhoids   . Nocturia more than twice per night   . Numbness of left foot   . S/p bare metal coronary artery stent 12/2004   BMS x1 to RCA  . Spinal stenosis   . Type 2 diabetes mellitus treated with insulin ([place])   . Urgency of urination   . Wears dentures    upper    Past Surgical History:  Procedure Laterality Date  . CARDIAC CATHETERIZATION  06-14-2004  dr Lia Foyer   total occlusion mD1, RI 30-40%, mRCA nondominant hazy 80% and scattered 30-40%,  ef 71% (medical mangement)  . CARDIOVASCULAR STRESS TEST  09-17-2015   dr Domenic Polite   Low risk nuclear study w/ reversible  mild small anteroapical defect,  normal LV function and wall motion, nuclear stress ef 61%  . CATARACT EXTRACTION W/ INTRAOCULAR LENS IMPLANT Right 07/2014  . COLONOSCOPY  09/10/2008   normal  . CORONARY ANGIOPLASTY WITH STENT PLACEMENT  12-17-2004   dr benismhon/ dr Olevia Perches   nonobstructive cad LAD and LCFx,  BMS x1 to total occlusion RCA   . CYSTOSCOPY W/ URETEROSCOPY W/ LITHOTRIPSY  08/2010  . ERCP  03/14/2008  . LAPAROSCOPIC CHOLECYSTECTOMY  05/2012  . LEFT HEART CATHETERIZATION WITH CORONARY ANGIOGRAM N/A 05/14/2011   Procedure: LEFT HEART CATHETERIZATION WITH CORONARY ANGIOGRAM;  Surgeon: Sherren Mocha, MD;  Location: [facility] CATH LAB;  Service: Cardiovascular;  Laterality: N/A;    non-obstructive LM, LAD, LCFx, patent RCA stent  . LUMBAR LAMINECTOMY/DECOMPRESSION MICRODISCECTOMY N/A 02/24/2017   Procedure: Microlumbar decompression L4-5, L5-S1;  Surgeon: Susa Day, MD;  Location: [facility] ORS;  Service: Orthopedics;  Laterality: N/A;  120 mins  . ROTATOR CUFF REPAIR Right 03/2002  . ROTATOR CUFF REPAIR Left 12/11/2015  . TOTAL HIP ARTHROPLASTY Right 12/20/2008  . TOTAL KNEE ARTHROPLASTY Bilateral left 12-11-2003/  right 07-31-2004   dr Wynelle Link [facility]  . UPPER GASTROINTESTINAL ENDOSCOPY  01/08/2008   bx, inlet patch, duodenitis  . YAG LASER APPLICATION Right 03/08/3333   Procedure: YAG LASER APPLICATION;  Surgeon: Williams Che, MD;  Location: [facility] ORS;  Service:  Ophthalmology;  Laterality: Right;    Current Outpatient Medications  Medication Sig Dispense Refill  . acetaminophen (TYLENOL) 650 MG CR tablet Take 1,300 mg by mouth 2 (two) times daily as needed for pain.     Marland Kitchen amLODipine (NORVASC) 5 MG tablet TAKE 1 TABLET BY MOUTH  DAILY (Patient taking differently: TAKE 1 TABLET BY MOUTH  DAILY--- takes in am) 90 tablet 0  . aspirin 81 MG tablet Take 1 tablet (81 mg total) by mouth every evening. Resume 4 days post-op 30 tablet   . aspirin-acetaminophen-caffeine (EXCEDRIN EXTRA STRENGTH)  250-250-65 MG tablet Take 1 tablet by mouth daily as needed (headaches). May resume 5 days post-op as needed 30 tablet 0  . butalbital-acetaminophen-caffeine (FIORICET, ESGIC) 50-325-40 MG tablet Take 1 tablet by mouth 2 (two) times daily as needed for headache. 14 tablet 0  . cetirizine (ZYRTEC) 10 MG tablet Take 10 mg by mouth every evening.     . chlorthalidone (HYGROTON) 25 MG tablet Take 0.5 tablets (12.5 mg total) by mouth daily. as directed 45 tablet 3  . Cholecalciferol (VITAMIN D3) 2000 UNITS TABS Take 2,000 Units by mouth every morning.     . docusate sodium (COLACE) 100 MG capsule Take 1 capsule (100 mg total) by mouth 2 (two) times daily. 60 capsule 2  . Dulaglutide (TRULICITY) 1.5 IZ/1.2WP SOPN Inject 1.5 mg into the skin once a week. 4 pen 2  . famotidine (PEPCID) 20 MG tablet TAKE 1 TABLET BY MOUTH TWO  TIMES DAILY 180 tablet 1  . HUMALOG KWIKPEN 100 UNIT/ML KiwkPen INJECT 0 TO 15 UNITS  SUBCUTANEOUSLY 3 TIMES  DAILY BEFORE MEALS  (70-150=8 UNITS, 151-200=9  UNITS) (Patient taking differently: inject 10-12 units before each meal) 45 mL 2  . HYDROcodone-acetaminophen (NORCO) 10-325 MG tablet Take 1 tablet by mouth every 4 (four) hours as needed for severe pain. 40 tablet 0  . Insulin Pen Needle (B-D ULTRAFINE III SHORT PEN) 31G X 8 MM MISC USE 4 TIMES DAILY DX E11.49 400 each 1  . LANTUS SOLOSTAR 100 UNIT/ML Solostar Pen INJECT SUBCUTANEOUSLY 52  UNITS AT BEDTIME 60 mL 0  . losartan (COZAAR) 50 MG tablet TAKE 1 TABLET BY MOUTH  DAILY 90 tablet 0  . Menthol, Topical Analgesic, (BLUE-EMU MAXIMUM STRENGTH EX) Apply 1 application topically daily as needed (muscle pain).    . metFORMIN (GLUCOPHAGE-XR) 500 MG 24 hr tablet TAKE 2 TABLETS BY MOUTH TWO TIMES DAILY 360 tablet 0  . nitroGLYCERIN (NITROSTAT) 0.4 MG SL tablet DISSOLVE ONE TABLET UNDER THE TONGUE EVERY 5 MINUTES AS NEEDED FOR CHEST PAIN.  DO NOT EXCEED A TOTAL OF 3 DOSES IN 15 MINUTES 25 tablet 3  . ONE TOUCH ULTRA TEST test strip  USE ONE STRIP TO CHECK GLUCOSE 4 TIMES DAILY 400 each 1  . PARoxetine (PAXIL) 20 MG tablet TAKE 1 TABLET BY MOUTH  DAILY (Patient taking differently: TAKE 1 TABLET BY MOUTH  DAILY IN THE EVENING) 90 tablet 0  . polyethylene glycol (MIRALAX / GLYCOLAX) packet Take 17 g by mouth daily. 14 each 0  . tamsulosin (FLOMAX) 0.4 MG CAPS capsule TAKE 1 CAPSULE BY MOUTH  DAILY (Patient taking differently: TAKE 1 CAPSULE BY MOUTH  DAILY IN THE EVENING) 90 capsule 3   No current facility-administered medications for this visit.    Allergies:  Ace inhibitors; Angiotensin receptor blockers; Ciprofloxacin; Other; Oxycodone-acetaminophen; Ramipril; Robaxin [methocarbamol]; Toradol [ketorolac tromethamine]; Valium; and Doxycycline   Social History: The patient  reports that he  quit smoking about 28 years ago. His smoking use included cigarettes. He started smoking about 54 years ago. He has a 45.00 pack-year smoking history. He quit smokeless tobacco use about 28 years ago. His smokeless tobacco use included chew. He reports that he does not drink alcohol or use drugs.   ROS:  Please see the history of present illness. Otherwise, complete review of systems is positive for improving back pain.  All other systems are reviewed and negative.   Physical Exam: VS:  BP 120/74   Pulse 82   Ht 5\' 6"  (1.676 m)   Wt 214 lb (97.1 kg)   SpO2 98%   BMI 34.54 kg/m , BMI Body mass index is 34.54 kg/m.  Wt Readings from Last 3 Encounters:  03/14/17 214 lb (97.1 kg)  02/24/17 215 lb (97.5 kg)  02/16/17 215 lb (97.5 kg)    General: Patient appears comfortable at rest. HEENT: Conjunctiva and lids normal, oropharynx clear. Neck: Supple, no elevated JVP or carotid bruits, no thyromegaly. Lungs: Clear to auscultation, nonlabored breathing at rest. Cardiac: Regular rate and rhythm, no S3 or significant systolic murmur. Abdomen: Soft, nontender, bowel sounds present. Extremities: No pitting edema, distal pulses 2+. Skin:  Warm and dry. Musculoskeletal: No kyphosis. Neuropsychiatric: Alert and oriented x3, affect grossly appropriate.  ECG: I personally reviewed the tracing from 03/17/2016 which showed normal sinus rhythm.  Recent Labwork: 01/27/2017: ALT 18; AST 14 02/25/2017: BUN 15; Creatinine, Ser 1.05; Hemoglobin 12.6; Platelets 224; Potassium 3.7; Sodium 137     Component Value Date/Time   CHOL 131 01/27/2017 0905   CHOL 139 06/02/2012 1007   TRIG 74 01/27/2017 0905   TRIG 185 (H) 04/10/2013 0937   TRIG 107 06/02/2012 1007   HDL 42 01/27/2017 0905   HDL 34 (L) 04/10/2013 0937   HDL 40 06/02/2012 1007   CHOLHDL 3.1 01/27/2017 0905   CHOLHDL 6.2 05/14/2011 0550   VLDL UNABLE TO CALCULATE IF TRIGLYCERIDE OVER 400 mg/dL 05/14/2011 0550   LDLCALC 74 01/27/2017 0905   LDLCALC 62 04/10/2013 0937   LDLCALC 78 06/02/2012 1007    Other Studies Reviewed Today:  Lexiscan Myoview 09/17/2015:  No diagnostic ST segment changes to indicate ischemia.  Small, mild intensity, reversible anteroapical defect consistent with a small region of ischemia.  This is a low risk study.  Nuclear stress EF: 61%.  Assessment and Plan:  1.  CAD status post BMS to nondominant RCA in 2006 with otherwise moderate disease that has been managed medically.  Follow-up Myoview in 2017 was low risk and he does not report any progressive angina symptoms on medical therapy.  2.  Mixed hyperlipidemia, recommend resuming Lipitor at this point at 10 mg daily.  3.  Essential hypertension, blood pressure well controlled today.  He continues on Norvasc, chlorthalidone, and Cozaar.  4.  Type 2 diabetes mellitus, hemoglobin A1c 7.4% keep follow-up with PCP.  Current medicines were reviewed with the patient today.   Orders Placed This Encounter  Procedures  . EKG 12-Lead    Disposition: Follow-up in 6 months.  Signed, Satira Sark, MD, [facility] 03/14/2017 11:33 AM    [place] [facility] [place], [place], [place] [address] Phone: [phone]; Fax: [phone]

## 2017-03-14 ENCOUNTER — Encounter: Payer: Self-pay | Admitting: Cardiology

## 2017-03-14 ENCOUNTER — Other Ambulatory Visit: Payer: Self-pay

## 2017-03-14 ENCOUNTER — Ambulatory Visit: Payer: Medicare Other | Admitting: Cardiology

## 2017-03-14 VITALS — BP 120/74 | HR 82 | Ht 66.0 in | Wt 214.0 lb

## 2017-03-14 DIAGNOSIS — I2510 Atherosclerotic heart disease of native coronary artery without angina pectoris: Secondary | ICD-10-CM

## 2017-03-14 DIAGNOSIS — E1159 Type 2 diabetes mellitus with other circulatory complications: Secondary | ICD-10-CM | POA: Diagnosis not present

## 2017-03-14 DIAGNOSIS — E782 Mixed hyperlipidemia: Secondary | ICD-10-CM

## 2017-03-14 DIAGNOSIS — I10 Essential (primary) hypertension: Secondary | ICD-10-CM

## 2017-03-14 NOTE — Patient Instructions (Signed)

## 2017-03-16 DIAGNOSIS — M545 Low back pain: Secondary | ICD-10-CM | POA: Diagnosis not present

## 2017-03-17 ENCOUNTER — Other Ambulatory Visit: Payer: Self-pay | Admitting: Family Medicine

## 2017-03-23 ENCOUNTER — Telehealth: Payer: Self-pay | Admitting: Family Medicine

## 2017-03-23 NOTE — Telephone Encounter (Signed)
Wife aware

## 2017-03-23 NOTE — Telephone Encounter (Signed)
Samples ready.

## 2017-04-16 ENCOUNTER — Other Ambulatory Visit: Payer: Self-pay | Admitting: Family Medicine

## 2017-04-28 ENCOUNTER — Ambulatory Visit (INDEPENDENT_AMBULATORY_CARE_PROVIDER_SITE_OTHER): Payer: Medicare Other | Admitting: Family Medicine

## 2017-04-28 ENCOUNTER — Encounter: Payer: Self-pay | Admitting: Family Medicine

## 2017-04-28 VITALS — BP 124/70 | HR 72 | Temp 98.0°F | Ht 66.0 in | Wt 215.0 lb

## 2017-04-28 DIAGNOSIS — E1159 Type 2 diabetes mellitus with other circulatory complications: Secondary | ICD-10-CM

## 2017-04-28 DIAGNOSIS — I10 Essential (primary) hypertension: Secondary | ICD-10-CM

## 2017-04-28 DIAGNOSIS — E785 Hyperlipidemia, unspecified: Secondary | ICD-10-CM

## 2017-04-28 DIAGNOSIS — K219 Gastro-esophageal reflux disease without esophagitis: Secondary | ICD-10-CM

## 2017-04-28 DIAGNOSIS — E1169 Type 2 diabetes mellitus with other specified complication: Secondary | ICD-10-CM

## 2017-04-28 DIAGNOSIS — E1129 Type 2 diabetes mellitus with other diabetic kidney complication: Secondary | ICD-10-CM | POA: Diagnosis not present

## 2017-04-28 DIAGNOSIS — I152 Hypertension secondary to endocrine disorders: Secondary | ICD-10-CM

## 2017-04-28 DIAGNOSIS — R809 Proteinuria, unspecified: Secondary | ICD-10-CM

## 2017-04-28 DIAGNOSIS — E669 Obesity, unspecified: Secondary | ICD-10-CM | POA: Diagnosis not present

## 2017-04-28 DIAGNOSIS — I2510 Atherosclerotic heart disease of native coronary artery without angina pectoris: Secondary | ICD-10-CM

## 2017-04-28 DIAGNOSIS — Z794 Long term (current) use of insulin: Secondary | ICD-10-CM | POA: Diagnosis not present

## 2017-04-28 LAB — BAYER DCA HB A1C WAIVED: HB A1C: 6.4 % (ref <7.0)

## 2017-04-28 NOTE — Progress Notes (Signed)
BP 124/70   Pulse 72   Temp 98 F (36.7 C) (Oral)   Ht '5\' 6"'  (1.676 m)   Wt 215 lb (97.5 kg)   BMI 34.70 kg/m    Subjective:    Patient ID: [name], male    DOB: 05-03-1940, 77 y.o.   MRN: [id number]  HPI: BACILIO ABASCAL is a 77 y.o. male presenting on 04/28/2017 for Diabetes (3 mo); Hyperlipidemia; and Hypertension   HPI Type 2 diabetes mellitus Patient comes in today for recheck of his diabetes. Patient has been currently taking Lantus 48 and Humalog 8-10 3 times daily with meals and Trulicity and metformin. Patient is currently on an ACE inhibitor/ARB. Patient has not seen an ophthalmologist this year. Patient denies any issues with their feet.  Patient has known CAD and microalbuminuria  Hypertension Patient is currently on losartan, chlorthalidone and amlodipine, and their blood pressure today is 124/70. Patient denies any lightheadedness or dizziness. Patient denies headaches, blurred vision, chest pains, shortness of breath, or weakness. Denies any side effects from medication and is content with current medication.   Hyperlipidemia Patient is coming in for recheck of his hyperlipidemia. The patient is currently taking no medication currently, has had issues with statins previously. They deny any issues with myalgias or history of liver damage from it. They deny any focal numbness or weakness or chest pain.   GERD Patient is currently on no medication.  he denies any major symptoms or abdominal pain or belching or burping. She denies any blood in her stool or lightheadedness or dizziness.   Relevant past medical, surgical, family and social history reviewed and updated as indicated. Interim medical history since our last visit reviewed. Allergies and medications reviewed and updated.  Review of Systems  Constitutional: Negative for chills and fever.  Eyes: Negative for discharge.  Respiratory: Negative for cough, shortness of breath and wheezing.     Cardiovascular: Negative for chest pain and leg swelling.  Musculoskeletal: Negative for back pain and gait problem.  Skin: Negative for rash.  Neurological: Negative for dizziness, weakness, light-headedness and headaches.  All other systems reviewed and are negative.   Per HPI unless specifically indicated above   Allergies as of 04/28/2017      Reactions   Ace Inhibitors Hives, Swelling, Rash   Rash,hives,tongue swelling   Angiotensin Receptor Blockers    Unknown reaction    Ciprofloxacin Other (See Comments)   confusion   Other Hives, Other (See Comments)   Altaseptic, altase (cough)   Oxycodone-acetaminophen Other (See Comments)   REACTION: unknown reaction   Ramipril Cough   Robaxin [methocarbamol] Other (See Comments)   Confusion    Toradol [ketorolac Tromethamine] Other (See Comments)   confusion   Valium Other (See Comments)   Hallucinations; confusion   Doxycycline Hives, Rash      Medication List        Accurate as of 04/28/17  8:40 AM. Always use your most recent med list.          acetaminophen 650 MG CR tablet Commonly known as:  TYLENOL Take 1,300 mg by mouth 2 (two) times daily as needed for pain.   amLODipine 5 MG tablet Commonly known as:  NORVASC TAKE 1 TABLET BY MOUTH  DAILY   aspirin 81 MG tablet Take 1 tablet (81 mg total) by mouth every evening. Resume 4 days post-op   aspirin-acetaminophen-caffeine 250-250-65 MG tablet Commonly known as:  EXCEDRIN EXTRA STRENGTH Take 1 tablet by mouth  daily as needed (headaches). May resume 5 days post-op as needed   BLUE-EMU MAXIMUM STRENGTH EX Apply 1 application topically daily as needed (muscle pain).   butalbital-acetaminophen-caffeine 50-325-40 MG tablet Commonly known as:  FIORICET, ESGIC Take 1 tablet by mouth 2 (two) times daily as needed for headache.   cetirizine 10 MG tablet Commonly known as:  ZYRTEC Take 10 mg by mouth every evening.   chlorthalidone 25 MG tablet Commonly known  as:  HYGROTON Take 0.5 tablets (12.5 mg total) by mouth daily. as directed   docusate sodium 100 MG capsule Commonly known as:  COLACE Take 1 capsule (100 mg total) by mouth 2 (two) times daily.   Dulaglutide 1.5 MG/0.5ML Sopn Commonly known as:  TRULICITY Inject 1.5 mg into the skin once a week.   famotidine 20 MG tablet Commonly known as:  PEPCID TAKE 1 TABLET BY MOUTH TWO  TIMES DAILY   HUMALOG KWIKPEN 100 UNIT/ML KiwkPen Generic drug:  insulin lispro INJECT 0 TO 15 UNITS  SUBCUTANEOUSLY 3 TIMES  DAILY BEFORE MEALS  (70-150=8 UNITS, 151-200=9  UNITS)   HYDROcodone-acetaminophen 10-325 MG tablet Commonly known as:  NORCO Take 1 tablet by mouth every 4 (four) hours as needed for severe pain.   Insulin Pen Needle 31G X 8 MM Misc Commonly known as:  B-D ULTRAFINE III SHORT PEN USE 4 TIMES DAILY DX E11.49   LANTUS SOLOSTAR 100 UNIT/ML Solostar Pen Generic drug:  Insulin Glargine INJECT SUBCUTANEOUSLY 52  UNITS AT BEDTIME   losartan 50 MG tablet Commonly known as:  COZAAR TAKE 1 TABLET BY MOUTH  DAILY   metFORMIN 500 MG 24 hr tablet Commonly known as:  GLUCOPHAGE-XR TAKE 2 TABLETS BY MOUTH TWO TIMES DAILY   nitroGLYCERIN 0.4 MG SL tablet Commonly known as:  NITROSTAT DISSOLVE ONE TABLET UNDER THE TONGUE EVERY 5 MINUTES AS NEEDED FOR CHEST PAIN.  DO NOT EXCEED A TOTAL OF 3 DOSES IN 15 MINUTES   ONE TOUCH ULTRA TEST test strip Generic drug:  glucose blood USE ONE STRIP TO CHECK GLUCOSE 4 TIMES DAILY   PARoxetine 20 MG tablet Commonly known as:  PAXIL TAKE 1 TABLET BY MOUTH  DAILY   polyethylene glycol packet Commonly known as:  MIRALAX / GLYCOLAX Take 17 g by mouth daily.   tamsulosin 0.4 MG Caps capsule Commonly known as:  FLOMAX TAKE 1 CAPSULE BY MOUTH  DAILY   Vitamin D3 2000 units Tabs Take 2,000 Units by mouth every morning.          Objective:    BP 124/70   Pulse 72   Temp 98 F (36.7 C) (Oral)   Ht '5\' 6"'  (1.676 m)   Wt 215 lb (97.5 kg)    BMI 34.70 kg/m   Wt Readings from Last 3 Encounters:  04/28/17 215 lb (97.5 kg)  03/14/17 214 lb (97.1 kg)  02/24/17 215 lb (97.5 kg)    Physical Exam  Constitutional: He is oriented to person, place, and time. He appears well-developed and well-nourished. No distress.  Eyes: Conjunctivae are normal. No scleral icterus.  Neck: Neck supple. No thyromegaly present.  Cardiovascular: Normal rate, regular rhythm, normal heart sounds and intact distal pulses.  No murmur heard. Pulmonary/Chest: Effort normal and breath sounds normal. No respiratory distress. He has no wheezes.  Musculoskeletal: Normal range of motion. He exhibits no edema.  Lymphadenopathy:    He has no cervical adenopathy.  Neurological: He is alert and oriented to person, place, and time. Coordination normal.  Skin: Skin is warm and dry. No rash noted. He is not diaphoretic.  Psychiatric: He has a normal mood and affect. His behavior is normal.  Nursing note and vitals reviewed.       Assessment & Plan:   Problem List Items Addressed This Visit      Cardiovascular and Mediastinum   Hypertension associated with diabetes ([place])   Relevant Orders   CMP14+EGFR   CORONARY ATHEROSCLEROSIS NATIVE CORONARY ARTERY   Relevant Orders   CBC with Differential/Platelet   Type 2 diabetes mellitus with circulatory disorder (HCC) - Primary   Relevant Orders   Bayer DCA Hb A1c Waived   CMP14+EGFR   TSH     Digestive   GERD (gastroesophageal reflux disease)   Relevant Orders   CBC with Differential/Platelet     Endocrine   Hyperlipidemia associated with type 2 diabetes mellitus ([place])   Relevant Orders   Lipid panel   Microalbuminuria due to type 2 diabetes mellitus ([place])   Relevant Orders   CBC with Differential/Platelet   CMP14+EGFR     Other   Obesity (BMI 30-39.9)   Relevant Orders   Lipid panel   TSH       Follow up plan: Return in about 3 months (around 07/28/2017), or if symptoms worsen or fail to  improve, for Recheck diabetes.  Counseling provided for all of the vaccine components Orders Placed This Encounter  Procedures  . Bayer DCA Hb A1c Waived  . CBC with Differential/Platelet  . CMP14+EGFR  . Lipid panel  . TSH    Caryl Pina, MD [facility] 04/28/2017, 8:40 AM

## 2017-04-29 LAB — CMP14+EGFR
ALBUMIN: 4.1 g/dL (ref 3.5–4.8)
ALT: 14 IU/L (ref 0–44)
AST: 16 IU/L (ref 0–40)
Albumin/Globulin Ratio: 1.2 (ref 1.2–2.2)
Alkaline Phosphatase: 77 IU/L (ref 39–117)
BILIRUBIN TOTAL: 0.4 mg/dL (ref 0.0–1.2)
BUN / CREAT RATIO: 15 (ref 10–24)
BUN: 15 mg/dL (ref 8–27)
CALCIUM: 9.9 mg/dL (ref 8.6–10.2)
CHLORIDE: 99 mmol/L (ref 96–106)
CO2: 27 mmol/L (ref 20–29)
Creatinine, Ser: 1 mg/dL (ref 0.76–1.27)
GFR, EST AFRICAN AMERICAN: 84 mL/min/{1.73_m2} (ref >59)
GFR, EST NON AFRICAN AMERICAN: 72 mL/min/{1.73_m2} (ref >59)
GLUCOSE: 140 mg/dL — AB (ref 65–99)
Globulin, Total: 3.3 g/dL (ref 1.5–4.5)
Potassium: 4.2 mmol/L (ref 3.5–5.2)
Sodium: 141 mmol/L (ref 134–144)
TOTAL PROTEIN: 7.4 g/dL (ref 6.0–8.5)

## 2017-04-29 LAB — CBC WITH DIFFERENTIAL/PLATELET
BASOS ABS: 0 10*3/uL (ref 0.0–0.2)
Basos: 1 %
EOS (ABSOLUTE): 0.1 10*3/uL (ref 0.0–0.4)
Eos: 2 %
HEMOGLOBIN: 14 g/dL (ref 13.0–17.7)
Hematocrit: 41.4 % (ref 37.5–51.0)
IMMATURE GRANS (ABS): 0 10*3/uL (ref 0.0–0.1)
IMMATURE GRANULOCYTES: 0 %
LYMPHS: 32 %
Lymphocytes Absolute: 2.1 10*3/uL (ref 0.7–3.1)
MCH: 28.8 pg (ref 26.6–33.0)
MCHC: 33.8 g/dL (ref 31.5–35.7)
MCV: 85 fL (ref 79–97)
MONOCYTES: 8 %
Monocytes Absolute: 0.5 10*3/uL (ref 0.1–0.9)
NEUTROS ABS: 3.8 10*3/uL (ref 1.4–7.0)
NEUTROS PCT: 57 %
PLATELETS: 281 10*3/uL (ref 150–379)
RBC: 4.86 x10E6/uL (ref 4.14–5.80)
RDW: 15.1 % (ref 12.3–15.4)
WBC: 6.6 10*3/uL (ref 3.4–10.8)

## 2017-04-29 LAB — LIPID PANEL
Chol/HDL Ratio: 3.8 ratio (ref 0.0–5.0)
Cholesterol, Total: 128 mg/dL (ref 100–199)
HDL: 34 mg/dL — AB (ref >39)
LDL CALC: 68 mg/dL (ref 0–99)
Triglycerides: 129 mg/dL (ref 0–149)
VLDL Cholesterol Cal: 26 mg/dL (ref 5–40)

## 2017-04-29 LAB — TSH: TSH: 1.53 u[IU]/mL (ref 0.450–4.500)

## 2017-05-23 ENCOUNTER — Encounter: Payer: Self-pay | Admitting: *Deleted

## 2017-05-23 ENCOUNTER — Ambulatory Visit (INDEPENDENT_AMBULATORY_CARE_PROVIDER_SITE_OTHER): Payer: Medicare Other | Admitting: *Deleted

## 2017-05-23 VITALS — BP 116/66 | HR 72 | Ht 66.0 in | Wt 218.0 lb

## 2017-05-23 DIAGNOSIS — Z0000 Encounter for general adult medical examination without abnormal findings: Secondary | ICD-10-CM | POA: Diagnosis not present

## 2017-05-23 NOTE — Patient Instructions (Signed)
  [name] , Thank you for taking time to come for your Medicare Wellness Visit. I appreciate your ongoing commitment to your health goals. Please review the following plan we discussed and let me know if I can assist you in the future.   These are the goals we discussed: Goals    . Exercise 150 min/wk Moderate Activity    . HEMOGLOBIN A1C < 7.0    . Weight (lb) < 185 lb (83.9 kg)       This is a list of the screening recommended for you and due dates:  Health Maintenance  Topic Date Due  . Flu Shot  08/11/2017  . Eye exam for diabetics  08/23/2017  . Hemoglobin A1C  10/28/2017  . Complete foot exam   01/27/2018  . Tetanus Vaccine  01/02/2023  . Pneumonia vaccines  Completed

## 2017-05-23 NOTE — Progress Notes (Signed)
Subjective:   [name] is a 77 y.o. male who presents for a Medicare Annual Wellness Visit. [name] lives at home with his wife.   Review of Systems    Patient reports that her health is unchanged compared to last year.  Cardiac Risk Factors include: advanced age (>27men, >71 women);dyslipidemia;hypertension;diabetes mellitus;male gender;obesity (BMI >30kg/m2);sedentary lifestyle;family history of premature cardiovascular disease;smoking/ tobacco exposure       Current Medications (verified) Outpatient Encounter Medications as of 05/23/2017  Medication Sig  . acetaminophen (TYLENOL) 650 MG CR tablet Take 1,300 mg by mouth 2 (two) times daily as needed for pain.   Marland Kitchen amLODipine (NORVASC) 5 MG tablet TAKE 1 TABLET BY MOUTH  DAILY  . aspirin 81 MG tablet Take 1 tablet (81 mg total) by mouth every evening. Resume 4 days post-op  . aspirin-acetaminophen-caffeine (EXCEDRIN EXTRA STRENGTH) 250-250-65 MG tablet Take 1 tablet by mouth daily as needed (headaches). May resume 5 days post-op as needed  . butalbital-acetaminophen-caffeine (FIORICET, ESGIC) 50-325-40 MG tablet Take 1 tablet by mouth 2 (two) times daily as needed for headache.  . cetirizine (ZYRTEC) 10 MG tablet Take 10 mg by mouth every evening.   . chlorthalidone (HYGROTON) 25 MG tablet Take 0.5 tablets (12.5 mg total) by mouth daily. as directed  . Cholecalciferol (VITAMIN D3) 2000 UNITS TABS Take 2,000 Units by mouth every morning.   . docusate sodium (COLACE) 100 MG capsule Take 1 capsule (100 mg total) by mouth 2 (two) times daily.  . Dulaglutide (TRULICITY) 1.5 NK/5.3ZJ SOPN Inject 1.5 mg into the skin once a week.  . famotidine (PEPCID) 20 MG tablet TAKE 1 TABLET BY MOUTH TWO  TIMES DAILY  . HUMALOG KWIKPEN 100 UNIT/ML KiwkPen INJECT 0 TO 15 UNITS  SUBCUTANEOUSLY 3 TIMES  DAILY BEFORE MEALS  (70-150=8 UNITS, 151-200=9  UNITS) (Patient taking differently: inject 10-12 units before each meal)  .  HYDROcodone-acetaminophen (NORCO) 10-325 MG tablet Take 1 tablet by mouth every 4 (four) hours as needed for severe pain.  . Insulin Pen Needle (B-D ULTRAFINE III SHORT PEN) 31G X 8 MM MISC USE 4 TIMES DAILY DX E11.49  . LANTUS SOLOSTAR 100 UNIT/ML Solostar Pen INJECT SUBCUTANEOUSLY 52  UNITS AT BEDTIME  . losartan (COZAAR) 50 MG tablet TAKE 1 TABLET BY MOUTH  DAILY  . Menthol, Topical Analgesic, (BLUE-EMU MAXIMUM STRENGTH EX) Apply 1 application topically daily as needed (muscle pain).  . metFORMIN (GLUCOPHAGE-XR) 500 MG 24 hr tablet TAKE 2 TABLETS BY MOUTH TWO TIMES DAILY  . nitroGLYCERIN (NITROSTAT) 0.4 MG SL tablet DISSOLVE ONE TABLET UNDER THE TONGUE EVERY 5 MINUTES AS NEEDED FOR CHEST PAIN.  DO NOT EXCEED A TOTAL OF 3 DOSES IN 15 MINUTES  . ONE TOUCH ULTRA TEST test strip USE ONE STRIP TO CHECK GLUCOSE 4 TIMES DAILY  . PARoxetine (PAXIL) 20 MG tablet TAKE 1 TABLET BY MOUTH  DAILY  . polyethylene glycol (MIRALAX / GLYCOLAX) packet Take 17 g by mouth daily.  . tamsulosin (FLOMAX) 0.4 MG CAPS capsule TAKE 1 CAPSULE BY MOUTH  DAILY (Patient taking differently: TAKE 1 CAPSULE BY MOUTH  DAILY IN THE EVENING)   No facility-administered encounter medications on file as of 05/23/2017.     Allergies (verified) Ace inhibitors; Angiotensin receptor blockers; Ciprofloxacin; Other; Oxycodone-acetaminophen; Ramipril; Robaxin [methocarbamol]; Toradol [ketorolac tromethamine]; Valium; and Doxycycline   History: Past Medical History:  Diagnosis Date  . Anxiety disorder   . Arthritis   . Chronic headaches   . Coronary  atherosclerosis of native coronary artery    BMS nondominant RCA 12/2004  . Essential hypertension, benign   . GERD (gastroesophageal reflux disease)   . History of adenomatous polyp of colon   . History of kidney stones   . History of MI (myocardial infarction) 12/2004   s/p  BMS to RCA  . Hyperlipidemia   . Internal hemorrhoids   . Nocturia more than twice per night   . Numbness  of left foot   . S/p bare metal coronary artery stent 12/2004   BMS x1 to RCA  . Spinal stenosis   . Type 2 diabetes mellitus treated with insulin ([place])   . Urgency of urination   . Wears dentures    upper   Past Surgical History:  Procedure Laterality Date  . CARDIAC CATHETERIZATION  06-14-2004  dr Lia Foyer   total occlusion mD1, RI 30-40%, mRCA nondominant hazy 80% and scattered 30-40%,  ef 71% (medical mangement)  . CARDIOVASCULAR STRESS TEST  09-17-2015   dr Domenic Polite   Low risk nuclear study w/ reversible mild small anteroapical defect,  normal LV function and wall motion, nuclear stress ef 61%  . CATARACT EXTRACTION W/ INTRAOCULAR LENS IMPLANT Right 07/2014  . COLONOSCOPY  09/10/2008   normal  . CORONARY ANGIOPLASTY WITH STENT PLACEMENT  12-17-2004   dr benismhon/ dr Olevia Perches   nonobstructive cad LAD and LCFx,  BMS x1 to total occlusion RCA   . CYSTOSCOPY W/ URETEROSCOPY W/ LITHOTRIPSY  08/2010  . ERCP  03/14/2008  . LAPAROSCOPIC CHOLECYSTECTOMY  05/2012  . LEFT HEART CATHETERIZATION WITH CORONARY ANGIOGRAM N/A 05/14/2011   Procedure: LEFT HEART CATHETERIZATION WITH CORONARY ANGIOGRAM;  Surgeon: Sherren Mocha, MD;  Location: [facility] CATH LAB;  Service: Cardiovascular;  Laterality: N/A;    non-obstructive LM, LAD, LCFx, patent RCA stent  . LUMBAR LAMINECTOMY/DECOMPRESSION MICRODISCECTOMY N/A 02/24/2017   Procedure: Microlumbar decompression L4-5, L5-S1;  Surgeon: Susa Day, MD;  Location: [facility] ORS;  Service: Orthopedics;  Laterality: N/A;  120 mins  . ROTATOR CUFF REPAIR Right 03/2002  . ROTATOR CUFF REPAIR Left 12/11/2015  . TOTAL HIP ARTHROPLASTY Right 12/20/2008  . TOTAL KNEE ARTHROPLASTY Bilateral left 12-11-2003/  right 07-31-2004   dr Wynelle Link [facility]  . UPPER GASTROINTESTINAL ENDOSCOPY  01/08/2008   bx, inlet patch, duodenitis  . YAG LASER APPLICATION Right 0/98/1191   Procedure: YAG LASER APPLICATION;  Surgeon: Williams Che, MD;  Location: [facility] ORS;  Service: Ophthalmology;   Laterality: Right;   Family History  Problem Relation Age of Onset  . Colon cancer Mother        Diagnosed age 11  . Cancer Mother 15  . Heart disease Father   . Heart attack Father   . Breast cancer Sister   . Heart disease Brother   . Brain cancer Sister   . Heart disease Brother   . Bladder Cancer Brother   . Cancer - Other Brother    Social History   Socioeconomic History  . Marital status: Married    Spouse name: Not on file  . Number of children: 0  . Years of education: HS  . Highest education level: High school graduate  Occupational History  . Occupation: RETIRED    Comment: Power plant  Social Needs  . Financial resource strain: Not hard at all  . Food insecurity:    Worry: Never true    Inability: Never true  . Transportation needs:    Medical: No    Non-medical: No  Tobacco  Use  . Smoking status: Former Smoker    Packs/day: 1.00    Years: 20.00    Pack years: 20.00    Types: Cigarettes    Start date: 01/18/1963    Last attempt to quit: 08/11/1988    Years since quitting: 28.8  . Smokeless tobacco: Former Systems developer    Types: Chew    Quit date: 01/11/1989  . Tobacco comment: chewed 1 pack tobacco/day for 15 years  Substance and Sexual Activity  . Alcohol use: No    Alcohol/week: 0.0 oz    Frequency: Never  . Drug use: No  . Sexual activity: Yes  Lifestyle  . Physical activity:    Days per week: Not on file    Minutes per session: Not on file  . Stress: Not at all  Relationships  . Social connections:    Talks on phone: More than three times a week    Gets together: More than three times a week    Attends religious service: Never    Active member of club or organization: No    Attends meetings of clubs or organizations: Never    Relationship status: Married  Other Topics Concern  . Not on file  Social History Narrative   No regular exercise      Daily caffeine use: 3 cups   Patient is right handed.       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 bring in CBG monitor from home?: No  How often do you need to have someone help you when you read instructions, pamphlets, or other written materials from your doctor or pharmacy?: 1 - Never     Information entered by :: Chong Sicilian, RN  Activities of Daily Living In your present state of health, do you have any difficulty performing the following activities: 05/23/2017 02/24/2017  Hearing? Y -  Comment can't hear out of right ear due to ototoxic medication and has limited hearing in left ear -  Vision? N -  Comment eye exam in 08/2017 -  Difficulty concentrating or making decisions? N -  Walking or climbing stairs? N -  Dressing or bathing? N -  Doing errands, shopping? N N  Preparing Food and eating ? N -  Using the Toilet? N -  In the past six months, have you accidently leaked urine? N -  Do you have problems with loss of bowel control? N -  Managing your Medications? N -  Managing your Finances? N -  Housekeeping or managing your Housekeeping? N -  Some recent data might be hidden     Immunizations and Health Maintenance Immunization History  Administered Date(s) Administered  . Influenza, High Dose Seasonal PF 10/29/2015  . Influenza,inj,Quad PF,6+ Mos 11/08/2013, 10/26/2016  . Influenza-Unspecified 11/18/2014  . Pneumococcal Conjugate-13 01/01/2013  . Pneumococcal Polysaccharide-23 10/26/2016  . Tdap 01/01/2013  . Zoster Recombinat (Shingrix) 05/14/2016, 01/27/2017   There are no preventive care reminders to display for this patient.  Patient Care Team: Dettinger, Fransisca Kaufmann, MD as PCP - General (Family Medicine) Satira Sark, MD as Consulting Physician (Cardiology) Sydnee Cabal, MD as  Consulting Physician (Orthopedic Surgery) Gaynelle Arabian, MD as Consulting Physician (Orthopedic Surgery)  No hospitalizations, ER  visits, or surgeries this past year.   Objective:    Today's Vitals   05/23/17 1118  BP: 116/66  Pulse: 72  Weight: 218 lb (98.9 kg)  Height: 5\' 6"  (1.676 m)   Body mass index is 35.19 kg/m.  Advanced Directives 05/23/2017 02/24/2017 02/24/2017 02/16/2017 05/14/2016 03/09/2016 02/27/2016  Does Patient Have a Medical Advance Directive? Yes Yes Yes Yes Yes No No  Type of Advance Directive Living will;Healthcare Power of [place];Living will [place];Living will - Living will Living will  Does patient want to make changes to medical advance directive? No - Patient declined - No - Patient declined - - - -  Copy of Equality in Chart? Yes - Yes Yes - - -  Pre-existing out of facility DNR order (yellow form or pink MOST form) - - - - - - -         Assessment:   This is a routine wellness examination for Ariyan.  Hearing/Vision  normal or No deficits noted during visit. normal or No deficits noted during visit.   Diet 3 meals a day. Eat out often  Exercise Current Exercise Habits: The patient does not participate in regular exercise at present(Stays busy around home and yard), Exercise limited by: orthopedic condition(s)  Goals    . Exercise 150 min/wk Moderate Activity    . HEMOGLOBIN A1C < 7.0    . Weight (lb) < 185 lb (83.9 kg)      Depression Screen PHQ 2/9 Scores 05/23/2017 04/28/2017 01/27/2017 01/17/2017  PHQ - 2 Score 0 0 1 0    Fall Risk Fall Risk  05/23/2017 04/28/2017 01/27/2017 10/26/2016 09/10/2016  Falls in the past year? No Yes No No No  Number falls in past yr: - 1 - - -  Injury with Fall? No No - - -  Comment - - - - -  Risk for fall due to : History of fall(s);Impaired mobility - - - -  Follow up Falls prevention discussed - - - -    Is the patient's home free of  loose throw rugs in walkways, pet beds, electrical cords, etc?   yes      Grab bars in the bathroom? no      Walkin shower? yes      Shower Seat? no      Handrails on the stairs?   yes      Adequate lighting?   yes  Cognitive Function: MMSE - Mini Mental State Exam 05/14/2016  Orientation to time 5  Orientation to Place 5  Registration 3  Attention/ Calculation 5  Recall 3  Language- name 2 objects 2  Language- repeat 1  Language- follow 3 step command 3  Language- read & follow direction 1  Write a sentence 1  Copy design 1  Total score 30       Normal Cognitive Function Screening: Yes  Screening Tests Health Maintenance  Topic Date Due  . INFLUENZA VACCINE  08/11/2017  . OPHTHALMOLOGY EXAM  08/23/2017  . HEMOGLOBIN A1C  10/28/2017  . FOOT EXAM  01/27/2018  . TETANUS/TDAP  01/02/2023  . PNA vac Low Risk Adult  Completed     Cancer Screenings: Lung: Low Dose CT Chest recommended if Age 55-80 years, 30 pack-year currently smoking OR have quit w/in 15years. Patient does not qualify. Colorectal: Up to date? Not indicated  Additional Screenings Hepatitis C Screening: not indicated     Plan:  Goals    . Exercise 150 min/wk Moderate Activity    . HEMOGLOBIN A1C < 7.0    . Weight (lb) < 185 lb (83.9 kg)       Keep f/u with Dettinger, Fransisca Kaufmann, MD and any other specialty appointments you may have Continue current medications Move carefully to avoid falls. Use assistive devices like a can or walker if needed. Aim for at least 150 minutes of moderate activity a week. This can be chair exercises if necessary. Reading or puzzles are a good way to exercise your brain Stay connected with friends and family. Social connections are beneficial to your emotional and mental health.   Review and return a signed, witnessed, and notarized copy of Advance Directives if given.  The following health maintenance items are recommended based on your health history and current  guidelines: Colon cancer screening: No Tdap Vaccine: no Zostavax (Shingles vaccine):no Prevnar or Pneumovax (pneumonia vaccines): no    I have personally reviewed and noted the following in the patient's chart:   . Medical and social history . Use of alcohol, tobacco or illicit drugs  . Current medications and supplements . Functional ability and status . Nutritional status . Physical activity . Advanced directives . List of other physicians . Hospitalizations, surgeries, and ER visits in previous 12 months . Vitals . Screenings to include cognitive, depression, and falls . Referrals and appointments  In addition, I have reviewed and discussed with patient certain preventive protocols, quality metrics, and best practice recommendations. A written personalized care plan for preventive services as well as general preventive health recommendations were provided to patient.     Chong Sicilian, RN   05/23/2017

## 2017-06-16 ENCOUNTER — Telehealth: Payer: Self-pay | Admitting: Family Medicine

## 2017-06-26 ENCOUNTER — Other Ambulatory Visit: Payer: Self-pay | Admitting: Family Medicine

## 2017-06-27 ENCOUNTER — Encounter: Payer: Self-pay | Admitting: Family Medicine

## 2017-06-27 ENCOUNTER — Ambulatory Visit (INDEPENDENT_AMBULATORY_CARE_PROVIDER_SITE_OTHER): Payer: Medicare Other | Admitting: Family Medicine

## 2017-06-27 VITALS — BP 149/80 | HR 98 | Temp 97.9°F | Ht 66.0 in | Wt 217.0 lb

## 2017-06-27 DIAGNOSIS — M62838 Other muscle spasm: Secondary | ICD-10-CM | POA: Diagnosis not present

## 2017-06-27 MED ORDER — METHYLPREDNISOLONE ACETATE 80 MG/ML IJ SUSP
80.0000 mg | Freq: Once | INTRAMUSCULAR | Status: AC
Start: 2017-06-27 — End: 2017-06-27
  Administered 2017-06-27: 80 mg via INTRAMUSCULAR

## 2017-06-27 MED ORDER — BACLOFEN 10 MG PO TABS: 10.0000 mg | ORAL_TABLET | Freq: Every evening | ORAL | 0 refills | Status: DC | PRN

## 2017-06-27 NOTE — Progress Notes (Signed)
BP (!) 149/80   Pulse 98   Temp 97.9 F (36.6 C) (Oral)   Ht 5\' 6"  (1.676 m)   Wt 217 lb (98.4 kg)   BMI 35.02 kg/m    Subjective:    Patient ID: [name], male    DOB: 10/27/1940, 77 y.o.   MRN: [id number]  HPI: [name] is a 77 y.o. male presenting on 06/27/2017 for Pain in neck and shoulder, left side (x 1 week)   HPI Neck pain Patient comes in complaining of neck pain and soreness on the left side of his neck that started about 1 week ago.  He denies any major traumatic incident or event that brought this about.  The soreness has been on that outside of his neck and does not radiate anywhere else but is mainly in the muscles on that side.  He feels very tight and hurts especially more with rotating his neck side to side.  He has been using ibuprofen and Tylenol which is helped some.  He is also used some Excedrin.  Relevant past medical, surgical, family and social history reviewed and updated as indicated. Interim medical history since our last visit reviewed. Allergies and medications reviewed and updated.  Review of Systems  Constitutional: Negative for chills and fever.  Respiratory: Negative for shortness of breath and wheezing.   Cardiovascular: Negative for chest pain and leg swelling.  Musculoskeletal: Positive for arthralgias and neck pain. Negative for back pain and gait problem.  Skin: Negative for rash.  All other systems reviewed and are negative.   Per HPI unless specifically indicated above   Allergies as of 06/27/2017      Reactions   Ace Inhibitors Hives, Swelling, Rash   Rash,hives,tongue swelling   Angiotensin Receptor Blockers    Unknown reaction    Ciprofloxacin Other (See Comments)   confusion   Other Hives, Other (See Comments)   Altaseptic, altase (cough)   Oxycodone-acetaminophen Other (See Comments)   REACTION: unknown reaction   Ramipril Cough   Robaxin [methocarbamol] Other (See Comments)   Confusion    Toradol  [ketorolac Tromethamine] Other (See Comments)   confusion   Valium Other (See Comments)   Hallucinations; confusion   Doxycycline Hives, Rash      Medication List        Accurate as of 06/27/17 11:50 AM. Always use your most recent med list.          acetaminophen 650 MG CR tablet Commonly known as:  TYLENOL Take 1,300 mg by mouth 2 (two) times daily as needed for pain.   amLODipine 5 MG tablet Commonly known as:  NORVASC TAKE 1 TABLET BY MOUTH  DAILY   aspirin 81 MG tablet Take 1 tablet (81 mg total) by mouth every evening. Resume 4 days post-op   aspirin-acetaminophen-caffeine 250-250-65 MG tablet Commonly known as:  EXCEDRIN EXTRA STRENGTH Take 1 tablet by mouth daily as needed (headaches). May resume 5 days post-op as needed   baclofen 10 MG tablet Commonly known as:  LIORESAL Take 1 tablet (10 mg total) by mouth at bedtime as needed for muscle spasms.   BLUE-EMU MAXIMUM STRENGTH EX Apply 1 application topically daily as needed (muscle pain).   butalbital-acetaminophen-caffeine 50-325-40 MG tablet Commonly known as:  FIORICET, ESGIC Take 1 tablet by mouth 2 (two) times daily as needed for headache.   cetirizine 10 MG tablet Commonly known as:  ZYRTEC Take 10 mg by mouth every evening.   chlorthalidone 25  MG tablet Commonly known as:  HYGROTON Take 0.5 tablets (12.5 mg total) by mouth daily. as directed   docusate sodium 100 MG capsule Commonly known as:  COLACE Take 1 capsule (100 mg total) by mouth 2 (two) times daily.   Dulaglutide 1.5 MG/0.5ML Sopn Commonly known as:  TRULICITY Inject 1.5 mg into the skin once a week.   famotidine 20 MG tablet Commonly known as:  PEPCID TAKE 1 TABLET BY MOUTH TWO  TIMES DAILY   HUMALOG KWIKPEN 100 UNIT/ML KiwkPen Generic drug:  insulin lispro INJECT 0 TO 15 UNITS  SUBCUTANEOUSLY 3 TIMES  DAILY BEFORE MEALS  (70-150=8 UNITS, 151-200=9  UNITS)   HYDROcodone-acetaminophen 10-325 MG tablet Commonly known as:   NORCO Take 1 tablet by mouth every 4 (four) hours as needed for severe pain.   Insulin Pen Needle 31G X 8 MM Misc Commonly known as:  B-D ULTRAFINE III SHORT PEN USE 4 TIMES DAILY DX E11.49   LANTUS SOLOSTAR 100 UNIT/ML Solostar Pen Generic drug:  Insulin Glargine INJECT SUBCUTANEOUSLY 52  UNITS AT BEDTIME   losartan 50 MG tablet Commonly known as:  COZAAR TAKE 1 TABLET BY MOUTH  DAILY   metFORMIN 500 MG 24 hr tablet Commonly known as:  GLUCOPHAGE-XR TAKE 2 TABLETS BY MOUTH TWO TIMES DAILY   nitroGLYCERIN 0.4 MG SL tablet Commonly known as:  NITROSTAT DISSOLVE ONE TABLET UNDER THE TONGUE EVERY 5 MINUTES AS NEEDED FOR CHEST PAIN.  DO NOT EXCEED A TOTAL OF 3 DOSES IN 15 MINUTES   ONE TOUCH ULTRA TEST test strip Generic drug:  glucose blood USE ONE STRIP TO CHECK GLUCOSE 4 TIMES DAILY   PARoxetine 20 MG tablet Commonly known as:  PAXIL TAKE 1 TABLET BY MOUTH  DAILY   polyethylene glycol packet Commonly known as:  MIRALAX / GLYCOLAX Take 17 g by mouth daily.   tamsulosin 0.4 MG Caps capsule Commonly known as:  FLOMAX TAKE 1 CAPSULE BY MOUTH  DAILY   Vitamin D3 2000 units Tabs Take 2,000 Units by mouth every morning.          Objective:    BP (!) 149/80   Pulse 98   Temp 97.9 F (36.6 C) (Oral)   Ht 5\' 6"  (1.676 m)   Wt 217 lb (98.4 kg)   BMI 35.02 kg/m   Wt Readings from Last 3 Encounters:  06/27/17 217 lb (98.4 kg)  05/23/17 218 lb (98.9 kg)  04/28/17 215 lb (97.5 kg)    Physical Exam  Constitutional: He is oriented to person, place, and time. He appears well-developed and well-nourished. No distress.  Eyes: Conjunctivae are normal. No scleral icterus.  Neck: Neck supple. No thyromegaly present.  Cardiovascular: Normal rate, regular rhythm, normal heart sounds and intact distal pulses.  No murmur heard. Pulmonary/Chest: Effort normal and breath sounds normal. No respiratory distress. He has no wheezes.  Musculoskeletal: Normal range of motion.        Cervical back: He exhibits tenderness and spasm. He exhibits normal range of motion, no bony tenderness, no swelling, no deformity and no laceration.       Back:  Lymphadenopathy:    He has no cervical adenopathy.  Neurological: He is alert and oriented to person, place, and time. Coordination normal.  Skin: Skin is warm and dry. No rash noted. He is not diaphoretic.  Psychiatric: He has a normal mood and affect. His behavior is normal.  Nursing note and vitals reviewed.       Assessment &  Plan:   Problem List Items Addressed This Visit    None    Visit Diagnoses    Neck muscle spasm    -  Primary   Recommended stretching and exercising and using a tennis ball to massage.   Relevant Medications   methylPREDNISolone acetate (DEPO-MEDROL) injection 80 mg (Completed)   baclofen (LIORESAL) 10 MG tablet      He will watch blood sugars closely with the double shot. Follow up plan: Return if symptoms worsen or fail to improve.  Counseling provided for all of the vaccine components No orders of the defined types were placed in this encounter.   Caryl Pina, MD [facility] 06/27/2017, 11:50 AM

## 2017-06-30 ENCOUNTER — Telehealth: Payer: Self-pay | Admitting: Family Medicine

## 2017-06-30 NOTE — Telephone Encounter (Signed)
Patient 's wife aware and verbalized understanding. °

## 2017-07-01 ENCOUNTER — Other Ambulatory Visit: Payer: Self-pay

## 2017-07-01 ENCOUNTER — Encounter (HOSPITAL_COMMUNITY): Payer: Self-pay | Admitting: *Deleted

## 2017-07-01 ENCOUNTER — Emergency Department (HOSPITAL_COMMUNITY): Payer: Medicare Other

## 2017-07-01 ENCOUNTER — Emergency Department (HOSPITAL_COMMUNITY)
Admission: EM | Admit: 2017-07-01 | Discharge: 2017-07-01 | Disposition: A | Discharge status: Home / Self Care | Payer: Medicare Other | Attending: Emergency Medicine | Admitting: Emergency Medicine

## 2017-07-01 DIAGNOSIS — I10 Essential (primary) hypertension: Secondary | ICD-10-CM | POA: Diagnosis not present

## 2017-07-01 DIAGNOSIS — Z79899 Other long term (current) drug therapy: Secondary | ICD-10-CM | POA: Diagnosis not present

## 2017-07-01 DIAGNOSIS — Z7982 Long term (current) use of aspirin: Secondary | ICD-10-CM | POA: Diagnosis not present

## 2017-07-01 DIAGNOSIS — M542 Cervicalgia: Secondary | ICD-10-CM | POA: Diagnosis not present

## 2017-07-01 DIAGNOSIS — Z794 Long term (current) use of insulin: Secondary | ICD-10-CM | POA: Diagnosis not present

## 2017-07-01 DIAGNOSIS — I2510 Atherosclerotic heart disease of native coronary artery without angina pectoris: Secondary | ICD-10-CM | POA: Insufficient documentation

## 2017-07-01 DIAGNOSIS — E1159 Type 2 diabetes mellitus with other circulatory complications: Secondary | ICD-10-CM | POA: Insufficient documentation

## 2017-07-01 DIAGNOSIS — M62838 Other muscle spasm: Secondary | ICD-10-CM

## 2017-07-01 DIAGNOSIS — M5030 Other cervical disc degeneration, unspecified cervical region: Secondary | ICD-10-CM

## 2017-07-01 DIAGNOSIS — Z87891 Personal history of nicotine dependence: Secondary | ICD-10-CM | POA: Diagnosis not present

## 2017-07-01 NOTE — ED Notes (Signed)
Pt erupted upon nurse saying he has been here for an hour  He called prior to coming and was told not busy  Is angry that he is waiting   Pt informed that he is awaiting rad

## 2017-07-01 NOTE — ED Provider Notes (Signed)
[facility] EMERGENCY DEPARTMENT Provider Note   CSN: [id number] Arrival date & time: 07/01/17  1916     History   Chief Complaint Chief Complaint  Patient presents with  . Neck Pain    HPI [name] is a 77 y.o. male.  Patient is a 77 year old male who presents to the emergency department with pain of the neck and shoulder areas.  The patient states that he has had 11 days of increasing pain of the neck and shoulder area.  He has been evaluated by his primary physician at the [facility].  He was given an injection of steroids and given a prescription for baclofen.  The patient states that the shot does not seem to be helping much.  Today he was having pain while sitting trying to eat his dinner.  He had pain when trying to take a shower.  And he could not rest.  The patient denied any chest pain, sweating, vomiting, loss of consciousness, or jaw pain.  The pain that he felt today was similar to the pains he has been feeling related to his neck and shoulder area.  The patient has been referred to orthopedics for evaluation and possible injection into the cervical spine area.  It is of note that the patient had back surgery earlier this year because of disc related issues.  The patient is not dropping items, but states that he is having increasing pain.  He presents now for assistance with this issue.  And he is also requesting an x-ray because he says one has not been done as of yet by his physician.     Past Medical History:  Diagnosis Date  . Anxiety disorder   . Arthritis   . Chronic headaches   . Coronary atherosclerosis of native coronary artery    BMS nondominant RCA 12/2004  . Essential hypertension, benign   . GERD (gastroesophageal reflux disease)   . History of adenomatous polyp of colon   . History of kidney stones   . History of MI (myocardial infarction) 12/2004   s/p  BMS to RCA  . Hyperlipidemia   . Internal hemorrhoids   .  Nocturia more than twice per night   . Numbness of left foot   . S/p bare metal coronary artery stent 12/2004   BMS x1 to RCA  . Spinal stenosis   . Type 2 diabetes mellitus treated with insulin ([place])   . Urgency of urination   . Wears dentures    upper    Patient Active Problem List   Diagnosis Date Noted  . Lumbar spinal stenosis 02/25/2017  . Spinal stenosis of lumbar region 02/24/2017  . Spinal stenosis at L4-L5 level 02/24/2017  . Lumbar radicular pain 01/17/2017  . Lumbar pain 01/17/2017  . Microalbuminuria due to type 2 diabetes mellitus ([place]) 07/23/2016  . Common migraine 12/23/2014  . Hx of adenomatous colonic polyps 05/09/2014  . Obesity (BMI 30-39.9) 07/30/2013  . Type 2 diabetes mellitus with circulatory disorder ([place]) 05/27/2011  . Hyperlipidemia associated with type 2 diabetes mellitus ([place]) 10/19/2008  . Hypertension associated with diabetes ([place]) 10/19/2008  . CORONARY ATHEROSCLEROSIS NATIVE CORONARY ARTERY 10/17/2008  . GERD (gastroesophageal reflux disease) 12/27/2007    Past Surgical History:  Procedure Laterality Date  . CARDIAC CATHETERIZATION  06-14-2004  dr Lia Foyer   total occlusion mD1, RI 30-40%, mRCA nondominant hazy 80% and scattered 30-40%,  ef 71% (medical mangement)  . CARDIOVASCULAR STRESS TEST  09-17-2015  dr Domenic Polite   Low risk nuclear study w/ reversible mild small anteroapical defect,  normal LV function and wall motion, nuclear stress ef 61%  . CATARACT EXTRACTION W/ INTRAOCULAR LENS IMPLANT Right 07/2014  . COLONOSCOPY  09/10/2008   normal  . CORONARY ANGIOPLASTY WITH STENT PLACEMENT  12-17-2004   dr benismhon/ dr Olevia Perches   nonobstructive cad LAD and LCFx,  BMS x1 to total occlusion RCA   . CYSTOSCOPY W/ URETEROSCOPY W/ LITHOTRIPSY  08/2010  . ERCP  03/14/2008  . LAPAROSCOPIC CHOLECYSTECTOMY  05/2012  . LEFT HEART CATHETERIZATION WITH CORONARY ANGIOGRAM N/A 05/14/2011   Procedure: LEFT HEART CATHETERIZATION WITH CORONARY ANGIOGRAM;   Surgeon: Sherren Mocha, MD;  Location: [facility] CATH LAB;  Service: Cardiovascular;  Laterality: N/A;    non-obstructive LM, LAD, LCFx, patent RCA stent  . LUMBAR LAMINECTOMY/DECOMPRESSION MICRODISCECTOMY N/A 02/24/2017   Procedure: Microlumbar decompression L4-5, L5-S1;  Surgeon: Susa Day, MD;  Location: [facility] ORS;  Service: Orthopedics;  Laterality: N/A;  120 mins  . ROTATOR CUFF REPAIR Right 03/2002  . ROTATOR CUFF REPAIR Left 12/11/2015  . TOTAL HIP ARTHROPLASTY Right 12/20/2008  . TOTAL KNEE ARTHROPLASTY Bilateral left 12-11-2003/  right 07-31-2004   dr Wynelle Link [facility]  . UPPER GASTROINTESTINAL ENDOSCOPY  01/08/2008   bx, inlet patch, duodenitis  . YAG LASER APPLICATION Right 9/62/9528   Procedure: YAG LASER APPLICATION;  Surgeon: Williams Che, MD;  Location: [facility] ORS;  Service: Ophthalmology;  Laterality: Right;        Home Medications    Prior to Admission medications   Medication Sig Start Date End Date Taking? Authorizing Provider  acetaminophen (TYLENOL) 650 MG CR tablet Take 1,300 mg by mouth 2 (two) times daily as needed for pain.     [provider]  amLODipine (NORVASC) 5 MG tablet TAKE 1 TABLET BY MOUTH  DAILY 06/27/17   Dettinger, Fransisca Kaufmann, MD  aspirin 81 MG tablet Take 1 tablet (81 mg total) by mouth every evening. Resume 4 days post-op 02/25/17   Cecilie Kicks, PA-C  aspirin-acetaminophen-caffeine (EXCEDRIN EXTRA STRENGTH) [phone] MG tablet Take 1 tablet by mouth daily as needed (headaches). May resume 5 days post-op as needed 02/25/17   Cecilie Kicks, PA-C  baclofen (LIORESAL) 10 MG tablet Take 1 tablet (10 mg total) by mouth at bedtime as needed for muscle spasms. 06/27/17   Dettinger, Fransisca Kaufmann, MD  butalbital-acetaminophen-caffeine (FIORICET, ESGIC) [phone] MG tablet Take 1 tablet by mouth 2 (two) times daily as needed for headache. 01/17/17   Terald Sleeper, PA-C  cetirizine (ZYRTEC) 10 MG tablet Take 10 mg by mouth every evening.     [provider]  chlorthalidone (HYGROTON) 25 MG tablet Take 0.5 tablets (12.5 mg total) by mouth daily. as directed 10/26/16   Dettinger, Fransisca Kaufmann, MD  Cholecalciferol (VITAMIN D3) 2000 UNITS TABS Take 2,000 Units by mouth every morning.     [provider]  docusate sodium (COLACE) 100 MG capsule Take 1 capsule (100 mg total) by mouth 2 (two) times daily. 02/24/17 02/24/18  Susa Day, MD  Dulaglutide (TRULICITY) 1.5 UY/4.0HK SOPN Inject 1.5 mg into the skin once a week. 01/27/17   Dettinger, Fransisca Kaufmann, MD  famotidine (PEPCID) 20 MG tablet TAKE 1 TABLET BY MOUTH TWO  TIMES DAILY 06/27/17   Dettinger, Fransisca Kaufmann, MD  HUMALOG KWIKPEN 100 UNIT/ML KiwkPen INJECT 0 TO 15 UNITS  SUBCUTANEOUSLY 3 TIMES  DAILY BEFORE MEALS  (70-150=8 UNITS, 151-200=9  UNITS) Patient taking differently: inject  10-12 units before each meal 10/21/16   Dettinger, Fransisca Kaufmann, MD  HYDROcodone-acetaminophen (NORCO) 10-325 MG tablet Take 1 tablet by mouth every 4 (four) hours as needed for severe pain. 02/24/17   Susa Day, MD  Insulin Pen Needle (B-D ULTRAFINE III SHORT PEN) 31G X 8 MM MISC USE 4 TIMES DAILY DX E11.49 11/18/14   Wardell Honour, MD  LANTUS SOLOSTAR 100 UNIT/ML Solostar Pen INJECT SUBCUTANEOUSLY 52  UNITS AT BEDTIME 03/09/17   Dettinger, Fransisca Kaufmann, MD  losartan (COZAAR) 50 MG tablet TAKE 1 TABLET BY MOUTH  DAILY 06/27/17   Dettinger, Fransisca Kaufmann, MD  Menthol, Topical Analgesic, (BLUE-EMU MAXIMUM STRENGTH EX) Apply 1 application topically daily as needed (muscle pain).    [provider]  metFORMIN (GLUCOPHAGE-XR) 500 MG 24 hr tablet TAKE 2 TABLETS BY MOUTH TWO TIMES DAILY 06/27/17   Dettinger, Fransisca Kaufmann, MD  nitroGLYCERIN (NITROSTAT) 0.4 MG SL tablet DISSOLVE ONE TABLET UNDER THE TONGUE EVERY 5 MINUTES AS NEEDED FOR CHEST PAIN.  DO NOT EXCEED A TOTAL OF 3 DOSES IN 15 MINUTES 02/03/17   Satira Sark, MD  ONE TOUCH ULTRA TEST test strip USE ONE STRIP TO CHECK GLUCOSE 4 TIMES DAILY 02/10/17   Dettinger, Fransisca Kaufmann, MD    PARoxetine (PAXIL) 20 MG tablet TAKE 1 TABLET BY MOUTH  DAILY 06/27/17   Dettinger, Fransisca Kaufmann, MD  polyethylene glycol (MIRALAX / GLYCOLAX) packet Take 17 g by mouth daily. 02/24/17   Susa Day, MD  tamsulosin (FLOMAX) 0.4 MG CAPS capsule TAKE 1 CAPSULE BY MOUTH  DAILY Patient taking differently: TAKE 1 CAPSULE BY MOUTH  DAILY IN THE EVENING 07/23/16   Dettinger, Fransisca Kaufmann, MD    Family History Family History  Problem Relation Age of Onset  . Colon cancer Mother        Diagnosed age 68  . Cancer Mother 26  . Heart disease Father   . Heart attack Father   . Breast cancer Sister   . Heart disease Brother   . Brain cancer Sister   . Heart disease Brother   . Bladder Cancer Brother   . Cancer - Other Brother     Social History Social History   Tobacco Use  . Smoking status: Former Smoker    Packs/day: 1.00    Years: 20.00    Pack years: 20.00    Types: Cigarettes    Start date: 01/18/1963    Last attempt to quit: 08/11/1988    Years since quitting: 28.9  . Smokeless tobacco: Former Systems developer    Types: Chew    Quit date: 01/11/1989  . Tobacco comment: chewed 1 pack tobacco/day for 15 years  Substance Use Topics  . Alcohol use: No    Alcohol/week: 0.0 oz    Frequency: Never  . Drug use: No     Allergies   Ace inhibitors; Angiotensin receptor blockers; Ciprofloxacin; Other; Oxycodone-acetaminophen; Ramipril; Robaxin [methocarbamol]; Toradol [ketorolac tromethamine]; Valium; and Doxycycline   Review of Systems Review of Systems  Musculoskeletal: Positive for back pain and neck pain.     Physical Exam Updated Vital Signs BP (!) 141/76 (BP Location: Right Arm)   Pulse 68   Temp 97.9 F (36.6 C) (Oral)   Resp 16   Wt 98.4 kg (217 lb)   SpO2 98%   BMI 35.02 kg/m   Physical Exam  Constitutional: He is oriented to person, place, and time. He appears well-developed and well-nourished.  Non-toxic appearance.  HENT:  Head: Normocephalic.  Right Ear: Tympanic membrane and  external ear normal.  Left Ear: Tympanic membrane and external ear normal.  Eyes: Pupils are equal, round, and reactive to light. EOM and lids are normal.  Neck: Normal range of motion. Neck supple. Carotid bruit is not present.  Cardiovascular: Normal rate, regular rhythm, normal heart sounds, intact distal pulses and normal pulses.  Pulmonary/Chest: Breath sounds normal. No respiratory distress.  Abdominal: Soft. Bowel sounds are normal. There is no tenderness. There is no guarding.  Musculoskeletal: Normal range of motion.       Cervical back: He exhibits pain and spasm.  Lymphadenopathy:       Head (right side): No submandibular adenopathy present.       Head (left side): No submandibular adenopathy present.    He has no cervical adenopathy.  Neurological: He is alert and oriented to person, place, and time. He has normal strength. No cranial nerve deficit or sensory deficit.  Grip is symmetrical. No motor or sensory deficits of the upper extremity.  Skin: Skin is warm and dry.  Psychiatric: He has a normal mood and affect. His speech is normal.  Nursing note and vitals reviewed.    ED Treatments / Results  Labs (all labs ordered are listed, but only abnormal results are displayed) Labs Reviewed - No data to display  EKG None  Radiology Dg Cervical Spine Complete  Result Date: 07/01/2017 CLINICAL DATA:  77 year old male with pain radiating up the neck for 11 days, progressive. EXAM: CERVICAL SPINE - COMPLETE 4+ VIEW COMPARISON:  Cervical spine CT 12/06/2014. FINDINGS: Straightening of cervical lordosis has increased since 2016. Normal prevertebral soft tissue contour. Bilateral posterior element alignment is within normal limits. Cervical AP alignment and C1-C2 alignment are within normal limits. Cervicothoracic junction alignment is within normal limits. Chronic disc space loss and endplate spurring at Z6-X0. Multilevel moderate to severe bilateral cervical facet hypertrophy,  perhaps maximal on the right at C4-C5 and on the left at C3-C4. Negative visible upper chest. IMPRESSION: 1.  No acute osseous abnormality identified in the cervical spine. 2. Advanced disc and endplate degeneration at C5-C6. Advanced bilateral cervical facet degeneration. Electronically Signed   By: Genevie Ann M.D.   On: 07/01/2017 22:11    Procedures Procedures (including critical care time)  Medications Ordered in ED Medications - No data to display   Initial Impression / Assessment and Plan / ED Course  I have reviewed the triage vital signs and the nursing notes.  Pertinent labs & imaging results that were available during my care of the patient were reviewed by me and considered in my medical decision making (see chart for details).       Final Clinical Impressions(s) / ED Diagnoses MDM  Vital signs reviewed.  The patient is ambulatory.  There are no gross neurologic deficits of the upper extremity.  X-ray of the cervical spine shows advanced disc and endplate degeneration at the C5-C6 area.  There is bilateral cervical facet degeneration throughout the cervical spine.  There is some spasm present, with some loss of the lordotic curve.  I discussed the x-rays with the patient and the patient's wife in detail.  We discussed some possible changes in the pattern of the pain medication and the muscle relaxer.  I have asked the patient to use the muscle relaxer every 8 hours.  I have asked him to use Tylenol extra strength for mild pain, and to use the hydrocodone every 6 hours for more severe pain.  I  also asked them to make sure that someone is walking with them when taking muscle relaxer, and or the pain medication.  I strongly advised the patient to keep the appointment with the orthopedic specialist as scheduled.  The family is in agreement with this plan.  They will return to the emergency department if any emergent changes in condition, problems, or concerns.   Final diagnoses:  DDD  (degenerative disc disease), cervical  Muscle spasms of neck    ED Discharge Orders    None       Lily Kocher, PA-C 07/01/17 2245    Mesner, Corene Cornea, MD 07/01/17 2347

## 2017-07-01 NOTE — ED Notes (Signed)
Awaiting rad read

## 2017-07-01 NOTE — ED Notes (Signed)
Pt discharged with instructions to spouse  Pt lying on stretcher and did not respond to RN instructions  Apology for being informed "not busy" And explanation that EDs can become busy in a matter of minutes  Spouse to pt  "We might can get home by 11 o'clock"

## 2017-07-01 NOTE — ED Notes (Signed)
HB in to assess 

## 2017-07-01 NOTE — ED Triage Notes (Signed)
Pt is here for pain in neck. Pt has had this pain for 11 days and has been seen for this. Pt is scheduled to get a shot in his neck on Tuesday at [facility] and he has been prescribed a muscle relaxant for this which has helped minimally.  No distal neuralgia, no associated with any trauma.

## 2017-07-01 NOTE — ED Notes (Signed)
From Rad 

## 2017-07-01 NOTE — ED Notes (Signed)
Pt and spouse informed of delay for read of rad  Rad transmits to [place] and will be back in around 20 minutes

## 2017-07-01 NOTE — Discharge Instructions (Addendum)
Your x-ray of the neck shows degenerative disc disease problems, arthritis with spurring, and muscle spasm.  Please use your baclofen every 8 hours.  Use Tylenol for mild pain, use your hydrocodone for more severe pain.  Please use caution when getting around after taking the baclofen, or the hydrocodone.  Please keep your appointment with the orthopedic specialist as scheduled.  Return to the emergency department if any changes in your condition, problems, or concerns.

## 2017-07-04 ENCOUNTER — Other Ambulatory Visit: Payer: Self-pay

## 2017-07-04 DIAGNOSIS — M47812 Spondylosis without myelopathy or radiculopathy, cervical region: Secondary | ICD-10-CM | POA: Diagnosis not present

## 2017-07-10 ENCOUNTER — Other Ambulatory Visit: Payer: Self-pay | Admitting: Family Medicine

## 2017-07-21 DIAGNOSIS — M47812 Spondylosis without myelopathy or radiculopathy, cervical region: Secondary | ICD-10-CM | POA: Diagnosis not present

## 2017-07-29 ENCOUNTER — Ambulatory Visit (INDEPENDENT_AMBULATORY_CARE_PROVIDER_SITE_OTHER): Payer: Medicare Other | Admitting: Family Medicine

## 2017-07-29 ENCOUNTER — Ambulatory Visit (INDEPENDENT_AMBULATORY_CARE_PROVIDER_SITE_OTHER): Payer: Medicare Other

## 2017-07-29 ENCOUNTER — Encounter: Payer: Self-pay | Admitting: Family Medicine

## 2017-07-29 VITALS — BP 107/59 | HR 67 | Temp 97.6°F | Ht 66.0 in | Wt 219.0 lb

## 2017-07-29 DIAGNOSIS — E1169 Type 2 diabetes mellitus with other specified complication: Secondary | ICD-10-CM | POA: Diagnosis not present

## 2017-07-29 DIAGNOSIS — N401 Enlarged prostate with lower urinary tract symptoms: Secondary | ICD-10-CM

## 2017-07-29 DIAGNOSIS — K5900 Constipation, unspecified: Secondary | ICD-10-CM

## 2017-07-29 DIAGNOSIS — Z794 Long term (current) use of insulin: Secondary | ICD-10-CM | POA: Diagnosis not present

## 2017-07-29 DIAGNOSIS — R5383 Other fatigue: Secondary | ICD-10-CM

## 2017-07-29 DIAGNOSIS — E1129 Type 2 diabetes mellitus with other diabetic kidney complication: Secondary | ICD-10-CM

## 2017-07-29 DIAGNOSIS — I10 Essential (primary) hypertension: Secondary | ICD-10-CM | POA: Diagnosis not present

## 2017-07-29 DIAGNOSIS — R1032 Left lower quadrant pain: Secondary | ICD-10-CM

## 2017-07-29 DIAGNOSIS — E1159 Type 2 diabetes mellitus with other circulatory complications: Secondary | ICD-10-CM

## 2017-07-29 DIAGNOSIS — N400 Enlarged prostate without lower urinary tract symptoms: Secondary | ICD-10-CM | POA: Insufficient documentation

## 2017-07-29 DIAGNOSIS — I152 Hypertension secondary to endocrine disorders: Secondary | ICD-10-CM

## 2017-07-29 DIAGNOSIS — R351 Nocturia: Secondary | ICD-10-CM

## 2017-07-29 DIAGNOSIS — R809 Proteinuria, unspecified: Secondary | ICD-10-CM

## 2017-07-29 DIAGNOSIS — E785 Hyperlipidemia, unspecified: Secondary | ICD-10-CM | POA: Diagnosis not present

## 2017-07-29 LAB — BAYER DCA HB A1C WAIVED: HB A1C: 7.2 % — AB (ref <7.0)

## 2017-07-29 NOTE — Progress Notes (Signed)
BP (!) 107/59 (BP Location: Left Arm)   Pulse 67   Temp 97.6 F (36.4 C) (Oral)   Ht 5' 6" (1.676 m)   Wt 219 lb (99.3 kg)   BMI 35.35 kg/m    Subjective:    Patient ID: [name], male    DOB: Mar 27, 1940, 77 y.o.   MRN: [id number]  HPI: [name] is a 77 y.o. male presenting on 07/29/2017 for Diabetes (3 mo); Hyperlipidemia; and Hypertension   HPI Type 2 diabetes mellitus Patient comes in today for recheck of his diabetes. Patient has been currently taking Humalog 10-12 3 times daily and Lantus 52 nightly and metformin and Trulicity. Patient is currently on an ACE inhibitor/ARB. Patient has not seen an ophthalmologist this year. Patient denies any issues with their feet.  Patient has microalbuminuria  Hypertension Patient is currently on amlodipine and chlorthalidone and losartan, and their blood pressure today is 107/59. Patient denies any lightheadedness or dizziness. Patient denies headaches, blurred vision, chest pains, shortness of breath, or weakness. Denies any side effects from medication and is content with current medication.   Hyperlipidemia Patient is coming in for recheck of his hyperlipidemia. The patient is currently taking no medication because is been intolerant of the medication previously.. They deny any issues with myalgias or history of liver damage from it. They deny any focal numbness or weakness or chest pain.   Lower abdominal pain and constipation Patient complains of left lower abdominal pain that is been intermittent and colicky more crampy in nature.  He says is been mild in nature as well and has been going on for the past few months.  He does admit to being more constipated but not all the time and does have bowel movements every 2 or 3 days.  He does admit to having to strain for bowel movements.  He denies any blood in his stool or nausea or vomiting.  He cannot recall exactly when he had a colonoscopy last.  BPH Patient is currently  taking Flomax and says it is helping with his BPH, will recheck lab values for his prostate today.  He says the Flomax helps him pretty much to keep him not having from too much nocturia, he did previously have nocturia.  Fatigue He comes in complaining of fatigue and does admit to snoring and decreased energy that is been gradually increasing over the past year or 2.  He says he just feels like he does not have energy throughout the day.  He does admit to if he sits for any prolonged period he will fall asleep rather quickly.  Relevant past medical, surgical, family and social history reviewed and updated as indicated. Interim medical history since our last visit reviewed. Allergies and medications reviewed and updated.  Review of Systems  Constitutional: Positive for fatigue. Negative for chills and fever.  Respiratory: Negative for shortness of breath and wheezing.   Cardiovascular: Negative for chest pain and leg swelling.  Gastrointestinal: Positive for abdominal pain and constipation. Negative for abdominal distention and vomiting.  Genitourinary: Positive for frequency. Negative for decreased urine volume, difficulty urinating, dysuria and testicular pain.  Musculoskeletal: Negative for back pain and gait problem.  Skin: Negative for rash.  Neurological: Negative for dizziness, weakness and numbness.  All other systems reviewed and are negative.   Per HPI unless specifically indicated above   Allergies as of 07/29/2017      Reactions   Ace Inhibitors Hives, Swelling, Rash   Rash,hives,tongue  swelling   Angiotensin Receptor Blockers    Unknown reaction    Ciprofloxacin Other (See Comments)   confusion   Other Hives, Other (See Comments)   Altaseptic, altase (cough)   Oxycodone-acetaminophen Other (See Comments)   REACTION: unknown reaction   Ramipril Cough   Robaxin [methocarbamol] Other (See Comments)   Confusion    Toradol [ketorolac Tromethamine] Other (See Comments)    confusion   Valium Other (See Comments)   Hallucinations; confusion   Doxycycline Hives, Rash      Medication List        Accurate as of 07/29/17  4:06 PM. Always use your most recent med list.          acetaminophen 650 MG CR tablet Commonly known as:  TYLENOL Take 1,300 mg by mouth 2 (two) times daily as needed for pain.   amLODipine 5 MG tablet Commonly known as:  NORVASC TAKE 1 TABLET BY MOUTH  DAILY   aspirin 81 MG tablet Take 1 tablet (81 mg total) by mouth every evening. Resume 4 days post-op   aspirin-acetaminophen-caffeine 250-250-65 MG tablet Commonly known as:  EXCEDRIN EXTRA STRENGTH Take 1 tablet by mouth daily as needed (headaches). May resume 5 days post-op as needed   baclofen 10 MG tablet Commonly known as:  LIORESAL Take 1 tablet (10 mg total) by mouth at bedtime as needed for muscle spasms.   BLUE-EMU MAXIMUM STRENGTH EX Apply 1 application topically daily as needed (muscle pain).   butalbital-acetaminophen-caffeine 50-325-40 MG tablet Commonly known as:  FIORICET, ESGIC Take 1 tablet by mouth 2 (two) times daily as needed for headache.   cetirizine 10 MG tablet Commonly known as:  ZYRTEC Take 10 mg by mouth every evening.   chlorthalidone 25 MG tablet Commonly known as:  HYGROTON Take 0.5 tablets (12.5 mg total) by mouth daily. as directed   docusate sodium 100 MG capsule Commonly known as:  COLACE Take 1 capsule (100 mg total) by mouth 2 (two) times daily.   Dulaglutide 1.5 MG/0.5ML Sopn Commonly known as:  TRULICITY Inject 1.5 mg into the skin once a week.   famotidine 20 MG tablet Commonly known as:  PEPCID TAKE 1 TABLET BY MOUTH TWO  TIMES DAILY   HUMALOG KWIKPEN 100 UNIT/ML KiwkPen Generic drug:  insulin lispro INJECT 0 TO 15 UNITS  SUBCUTANEOUSLY 3 TIMES  DAILY BEFORE MEALS  (70-150=8 UNITS, 151-200=9  UNITS)   HYDROcodone-acetaminophen 10-325 MG tablet Commonly known as:  NORCO Take 1 tablet by mouth every 4 (four) hours as  needed for severe pain.   Insulin Pen Needle 31G X 8 MM Misc Commonly known as:  B-D ULTRAFINE III SHORT PEN USE 4 TIMES DAILY DX E11.49   LANTUS SOLOSTAR 100 UNIT/ML Solostar Pen Generic drug:  Insulin Glargine INJECT SUBCUTANEOUSLY 52  UNITS AT BEDTIME   losartan 50 MG tablet Commonly known as:  COZAAR TAKE 1 TABLET BY MOUTH  DAILY   metFORMIN 500 MG 24 hr tablet Commonly known as:  GLUCOPHAGE-XR TAKE 2 TABLETS BY MOUTH TWO TIMES DAILY   nitroGLYCERIN 0.4 MG SL tablet Commonly known as:  NITROSTAT DISSOLVE ONE TABLET UNDER THE TONGUE EVERY 5 MINUTES AS NEEDED FOR CHEST PAIN.  DO NOT EXCEED A TOTAL OF 3 DOSES IN 15 MINUTES   ONE TOUCH ULTRA TEST test strip Generic drug:  glucose blood USE ONE STRIP TO CHECK GLUCOSE 4 TIMES DAILY   PARoxetine 20 MG tablet Commonly known as:  PAXIL TAKE 1 TABLET BY MOUTH  DAILY   polyethylene glycol packet Commonly known as:  MIRALAX / GLYCOLAX Take 17 g by mouth daily.   tamsulosin 0.4 MG Caps capsule Commonly known as:  FLOMAX TAKE 1 CAPSULE BY MOUTH  DAILY   Vitamin D3 2000 units Tabs Take 2,000 Units by mouth every morning.          Objective:    BP (!) 107/59 (BP Location: Left Arm)   Pulse 67   Temp 97.6 F (36.4 C) (Oral)   Ht 5' 6" (1.676 m)   Wt 219 lb (99.3 kg)   BMI 35.35 kg/m   Wt Readings from Last 3 Encounters:  07/29/17 219 lb (99.3 kg)  07/01/17 217 lb (98.4 kg)  06/27/17 217 lb (98.4 kg)    Physical Exam  Constitutional: He is oriented to person, place, and time. He appears well-developed and well-nourished. No distress.  Eyes: Pupils are equal, round, and reactive to light. Conjunctivae and EOM are normal. Right eye exhibits no discharge. No scleral icterus.  Neck: Neck supple. No thyromegaly present.  Cardiovascular: Normal rate, regular rhythm, normal heart sounds and intact distal pulses.  No murmur heard. Pulmonary/Chest: Effort normal and breath sounds normal. No respiratory distress. He has no  wheezes.  Abdominal: Soft. Bowel sounds are normal. He exhibits no distension and no mass. There is tenderness (Left lower quadrant abdominal tenderness, no rebound or guarding.). There is no guarding.  Musculoskeletal: Normal range of motion. He exhibits no edema.  Lymphadenopathy:    He has no cervical adenopathy.  Neurological: He is alert and oriented to person, place, and time. Coordination normal.  Skin: Skin is warm and dry. No rash noted. He is not diaphoretic.  Psychiatric: He has a normal mood and affect. His behavior is normal.  Nursing note and vitals reviewed.   KUB: No acute abnormalities    Assessment & Plan:   Problem List Items Addressed This Visit      Cardiovascular and Mediastinum   Hypertension associated with diabetes ([place])   Relevant Orders   CMP14+EGFR (Completed)   Type 2 diabetes mellitus with circulatory disorder ([place]) - Primary   Relevant Orders   Bayer DCA Hb A1c Waived (Completed)     Endocrine   Hyperlipidemia associated with type 2 diabetes mellitus (HCC)   Microalbuminuria due to type 2 diabetes mellitus (HCC)     Genitourinary   BPH (benign prostatic hyperplasia)   Relevant Orders   PSA, total and free (Completed)    Other Visit Diagnoses    Other fatigue       Relevant Orders   Ambulatory referral to Sleep Studies   CBC with Differential/Platelet (Completed)   Constipation, unspecified constipation type       Relevant Orders   DG Abd 1 View (Completed)   Ambulatory referral to Gastroenterology   Abdominal pain, acute, left lower quadrant       Relevant Orders   DG Abd 1 View (Completed)   Ambulatory referral to Gastroenterology   Morbid obesity ([place])           Follow up plan: Return in about 3 months (around 10/29/2017), or if symptoms worsen or fail to improve, for Diabetes hypertension cholesterol.  Counseling provided for all of the vaccine components Orders Placed This Encounter  Procedures  . DG Abd 1 View  . Ambulatory  referral to Sleep Studies  . Ambulatory referral to Gastroenterology    Caryl Pina, MD [facility] 07/29/2017, 4:06 PM

## 2017-07-30 LAB — CBC WITH DIFFERENTIAL/PLATELET
BASOS: 1 %
Basophils Absolute: 0.1 10*3/uL (ref 0.0–0.2)
EOS (ABSOLUTE): 0.2 10*3/uL (ref 0.0–0.4)
Eos: 3 %
HEMOGLOBIN: 14.6 g/dL (ref 13.0–17.7)
Hematocrit: 43.8 % (ref 37.5–51.0)
IMMATURE GRANS (ABS): 0 10*3/uL (ref 0.0–0.1)
Immature Granulocytes: 0 %
Lymphocytes Absolute: 2.2 10*3/uL (ref 0.7–3.1)
Lymphs: 28 %
MCH: 28.4 pg (ref 26.6–33.0)
MCHC: 33.3 g/dL (ref 31.5–35.7)
MCV: 85 fL (ref 79–97)
MONOCYTES: 9 %
Monocytes Absolute: 0.8 10*3/uL (ref 0.1–0.9)
NEUTROS ABS: 4.7 10*3/uL (ref 1.4–7.0)
Neutrophils: 59 %
Platelets: 276 10*3/uL (ref 150–450)
RBC: 5.14 x10E6/uL (ref 4.14–5.80)
RDW: 14.6 % (ref 12.3–15.4)
WBC: 8 10*3/uL (ref 3.4–10.8)

## 2017-07-30 LAB — PSA, TOTAL AND FREE
PROSTATE SPECIFIC AG, SERUM: 1.3 ng/mL (ref 0.0–4.0)
PSA FREE PCT: 29.2 %
PSA, Free: 0.38 ng/mL

## 2017-07-30 LAB — CMP14+EGFR
A/G RATIO: 1.3 (ref 1.2–2.2)
ALBUMIN: 3.9 g/dL (ref 3.5–4.8)
ALT: 17 IU/L (ref 0–44)
AST: 13 IU/L (ref 0–40)
Alkaline Phosphatase: 73 IU/L (ref 39–117)
BUN / CREAT RATIO: 17 (ref 10–24)
BUN: 18 mg/dL (ref 8–27)
Bilirubin Total: 0.5 mg/dL (ref 0.0–1.2)
CHLORIDE: 99 mmol/L (ref 96–106)
CO2: 26 mmol/L (ref 20–29)
Calcium: 9.5 mg/dL (ref 8.6–10.2)
Creatinine, Ser: 1.07 mg/dL (ref 0.76–1.27)
GFR calc Af Amer: 77 mL/min/{1.73_m2} (ref >59)
GFR calc non Af Amer: 67 mL/min/{1.73_m2} (ref >59)
Globulin, Total: 3.1 g/dL (ref 1.5–4.5)
Glucose: 144 mg/dL — ABNORMAL HIGH (ref 65–99)
POTASSIUM: 4.5 mmol/L (ref 3.5–5.2)
Sodium: 140 mmol/L (ref 134–144)
TOTAL PROTEIN: 7 g/dL (ref 6.0–8.5)

## 2017-08-01 ENCOUNTER — Telehealth: Payer: Self-pay

## 2017-08-01 NOTE — Telephone Encounter (Signed)
Pt's wife is aware that sleep study sent to [facility] and not [facility]. She is fine with this

## 2017-08-01 NOTE — Telephone Encounter (Signed)
Patient wants to make sure that his sleep study is scheduled at [facility].

## 2017-08-11 ENCOUNTER — Ambulatory Visit: Payer: Medicare Other | Admitting: Nurse Practitioner

## 2017-08-11 ENCOUNTER — Encounter: Payer: Self-pay | Admitting: Nurse Practitioner

## 2017-08-11 VITALS — BP 126/70 | HR 77 | Ht 66.0 in | Wt 220.0 lb

## 2017-08-11 DIAGNOSIS — Z860100 Personal history of colon polyps, unspecified: Secondary | ICD-10-CM

## 2017-08-11 DIAGNOSIS — R358 Other polyuria: Secondary | ICD-10-CM

## 2017-08-11 DIAGNOSIS — R1032 Left lower quadrant pain: Secondary | ICD-10-CM | POA: Diagnosis not present

## 2017-08-11 DIAGNOSIS — K5900 Constipation, unspecified: Secondary | ICD-10-CM | POA: Diagnosis not present

## 2017-08-11 DIAGNOSIS — R3589 Other polyuria: Secondary | ICD-10-CM

## 2017-08-11 NOTE — Patient Instructions (Addendum)
If you are age 77 or older, your body mass index should be between 23-30. Your Body mass index is 35.51 kg/m. If this is out of the aforementioned range listed, please consider follow up with your Primary Care Provider.  If you are age 43 or younger, your body mass index should be between 19-25. Your Body mass index is 35.51 kg/m. If this is out of the aformentioned range listed, please consider follow up with your Primary Care Provider.   Your provider has requested that you go to the basement level for lab work before leaving today. Press "B" on the elevator. The lab is located at the first door on the left as you exit the elevator. UA  INCREASE Miralax to three times daily for the next 3 days, then twice daily.  Continue until next appointment unless more than 2 bowel movements a day. Then decrease to once daily.  Call if pain worsens.  Follow up appointment with me on August 23., 2019 at 10 a.m.  Thank you for choosing me and [facility].   Tye Savoy, NP

## 2017-08-11 NOTE — Progress Notes (Addendum)
Primary GI:   Silvano Rusk, MD      Chief Complaint:    Says someone from our office called and asked him to come in for colonoscopy  IMPRESSION and PLAN:    #1. LLQ discomfort / constipation. Bowel movement have improved some after discontinuation of trulicity. He has been on stool softeners and daily miralax.   - increase water intake by at least 6 glasses daily -Increase MiraLAX to 3 times daily for the next 3 days then decrease to twice daily.  Continue twice daily dosing until follow-up visit with me in 2 to 3 weeks.  He will decrease dose back to once daily if having more than 2 bowel movements a day. -At time of follow-up if he is still having left sided abdominal discomfort/tenderness will consider CT scan.  We need to first eliminate constipation as etiology -Patient will call if pain gets worse before next follow-up -He is having nocturnal polyuria, ? prostate related.  No dysuria or hematuria.  Will check a UA  #2.  History of adenomatous colon polyps.  Last colonoscopy late April 2016 with findings of one tubular adenoma and one sessile serrated polyp (both < 1 cm). Surveillance colonoscopy not recommended but he wants to remain on surveillance program.       Agree with Ms. Vanita Ingles assessment and plan. Gatha Mayer, MD, [facility]   HPI:     Patient is a 77 yo male with medical problems not limited to hypertension, CAD with stents, DM 2 and anxiety . He also has a history of adenomatous colon polyps, no longer on surveillance program. Patient is here today upon request of CP for evaluation of constipation left lower quadrant pain.  Recent KUB unremarkable.  Patient thinks his constipation started with Trulicity about 8 months ago even though package inserts that it may cause diarrhea.  He started taking 3 stool softeners a day a few months ago and several weeks ago started daily MiraLAX.  Two weeks ago he decided to stop Trulicity and his bowels have since improved. PCP not  aware yet that he stopped the Trulicity.  No dysuria but endorses polyuria.            Review of systems:     No chest pain, no SOB, no fevers, no urinary sx   Past Medical History:  Diagnosis Date  . Anxiety disorder   . Arthritis   . Chronic headaches   . Coronary atherosclerosis of native coronary artery    BMS nondominant RCA 12/2004  . Essential hypertension, benign   . GERD (gastroesophageal reflux disease)   . History of adenomatous polyp of colon   . History of kidney stones   . History of MI (myocardial infarction) 12/2004   s/p  BMS to RCA  . Hyperlipidemia   . Internal hemorrhoids   . Nocturia more than twice per night   . Numbness of left foot   . S/p bare metal coronary artery stent 12/2004   BMS x1 to RCA  . Spinal stenosis   . Type 2 diabetes mellitus treated with insulin ([place])   . Urgency of urination   . Wears dentures    upper    Patient's surgical history, family medical history, social history, medications and allergies were all reviewed in Epic   Serum creatinine: 1.07 mg/dL 07/29/17 1633 Estimated creatinine clearance: 63.9 mL/min  Current Outpatient Medications  Medication Sig Dispense Refill  . acetaminophen (TYLENOL) 650 MG CR tablet  Take 1,300 mg by mouth 2 (two) times daily as needed for pain.     Marland Kitchen amLODipine (NORVASC) 5 MG tablet TAKE 1 TABLET BY MOUTH  DAILY 90 tablet 0  . aspirin 81 MG tablet Take 1 tablet (81 mg total) by mouth every evening. Resume 4 days post-op 30 tablet   . aspirin-acetaminophen-caffeine (EXCEDRIN EXTRA STRENGTH) 250-250-65 MG tablet Take 1 tablet by mouth daily as needed (headaches). May resume 5 days post-op as needed 30 tablet 0  . baclofen (LIORESAL) 10 MG tablet Take 1 tablet (10 mg total) by mouth at bedtime as needed for muscle spasms. 30 each 0  . butalbital-acetaminophen-caffeine (FIORICET, ESGIC) 50-325-40 MG tablet Take 1 tablet by mouth 2 (two) times daily as needed for headache. 14 tablet 0  . cetirizine  (ZYRTEC) 10 MG tablet Take 10 mg by mouth every evening.     . Cholecalciferol (VITAMIN D3) 2000 UNITS TABS Take 2,000 Units by mouth every morning.     . docusate sodium (COLACE) 100 MG capsule Take 1 capsule (100 mg total) by mouth 2 (two) times daily. 60 capsule 2  . famotidine (PEPCID) 20 MG tablet TAKE 1 TABLET BY MOUTH TWO  TIMES DAILY 180 tablet 0  . HUMALOG KWIKPEN 100 UNIT/ML KiwkPen INJECT 0 TO 15 UNITS  SUBCUTANEOUSLY 3 TIMES  DAILY BEFORE MEALS  (70-150=8 UNITS, 151-200=9  UNITS) (Patient taking differently: inject 10-12 units before each meal) 45 mL 2  . HYDROcodone-acetaminophen (NORCO) 10-325 MG tablet Take 1 tablet by mouth every 4 (four) hours as needed for severe pain. 40 tablet 0  . Insulin Pen Needle (B-D ULTRAFINE III SHORT PEN) 31G X 8 MM MISC USE 4 TIMES DAILY DX E11.49 400 each 1  . LANTUS SOLOSTAR 100 UNIT/ML Solostar Pen INJECT SUBCUTANEOUSLY 52  UNITS AT BEDTIME 60 mL 2  . losartan (COZAAR) 50 MG tablet TAKE 1 TABLET BY MOUTH  DAILY 90 tablet 0  . Menthol, Topical Analgesic, (BLUE-EMU MAXIMUM STRENGTH EX) Apply 1 application topically daily as needed (muscle pain).    . metFORMIN (GLUCOPHAGE-XR) 500 MG 24 hr tablet TAKE 2 TABLETS BY MOUTH TWO TIMES DAILY 360 tablet 0  . nitroGLYCERIN (NITROSTAT) 0.4 MG SL tablet DISSOLVE ONE TABLET UNDER THE TONGUE EVERY 5 MINUTES AS NEEDED FOR CHEST PAIN.  DO NOT EXCEED A TOTAL OF 3 DOSES IN 15 MINUTES 25 tablet 3  . ONE TOUCH ULTRA TEST test strip USE ONE STRIP TO CHECK GLUCOSE 4 TIMES DAILY 400 each 1  . PARoxetine (PAXIL) 20 MG tablet TAKE 1 TABLET BY MOUTH  DAILY 90 tablet 0  . polyethylene glycol (MIRALAX / GLYCOLAX) packet Take 17 g by mouth daily. 14 each 0  . tamsulosin (FLOMAX) 0.4 MG CAPS capsule TAKE 1 CAPSULE BY MOUTH  DAILY (Patient taking differently: TAKE 1 CAPSULE BY MOUTH  DAILY IN THE EVENING) 90 capsule 3   No current facility-administered medications for this visit.     Physical Exam:     BP 126/70   Pulse 77    Ht 5\' 6"  (1.676 m)   Wt 220 lb (99.8 kg)   BMI 35.51 kg/m   GENERAL:  Pleasant male in NAD PSYCH: : Cooperative, normal affect EENT:  conjunctiva pink, mucous membranes moist, neck supple without masses CARDIAC:  RRR, no peripheral edema PULM: Normal respiratory effort, lungs CTA bilaterally, no wheezing ABDOMEN:  Nondistended, soft, mild to moderate LLQ tenderness, neg Carnett's sign, + rectus diathesis. No obvious masses, no hepatomegaly,  normal  bowel sounds.  SKIN:  turgor, no lesions seen Musculoskeletal:  Normal muscle tone, normal strength NEURO: Alert and oriented x 3, no focal neurologic deficits   Tye Savoy , NP 08/11/2017, 8:47 AM

## 2017-08-23 DIAGNOSIS — H02834 Dermatochalasis of left upper eyelid: Secondary | ICD-10-CM | POA: Diagnosis not present

## 2017-08-23 DIAGNOSIS — E119 Type 2 diabetes mellitus without complications: Secondary | ICD-10-CM | POA: Diagnosis not present

## 2017-08-23 DIAGNOSIS — Z961 Presence of intraocular lens: Secondary | ICD-10-CM | POA: Diagnosis not present

## 2017-08-23 DIAGNOSIS — H2512 Age-related nuclear cataract, left eye: Secondary | ICD-10-CM | POA: Diagnosis not present

## 2017-08-23 DIAGNOSIS — H02831 Dermatochalasis of right upper eyelid: Secondary | ICD-10-CM | POA: Diagnosis not present

## 2017-09-01 ENCOUNTER — Other Ambulatory Visit: Payer: Self-pay | Admitting: Family Medicine

## 2017-09-02 ENCOUNTER — Other Ambulatory Visit: Payer: Self-pay | Admitting: Family Medicine

## 2017-09-02 ENCOUNTER — Encounter: Payer: Self-pay | Admitting: Nurse Practitioner

## 2017-09-02 ENCOUNTER — Ambulatory Visit: Payer: Medicare Other | Admitting: Nurse Practitioner

## 2017-09-02 VITALS — BP 134/70 | HR 85 | Ht 66.0 in | Wt 221.0 lb

## 2017-09-02 DIAGNOSIS — K5900 Constipation, unspecified: Secondary | ICD-10-CM

## 2017-09-02 NOTE — Patient Instructions (Signed)
Continue Miralax 1-2 x daily.   Follow up with [facility] as needed.

## 2017-09-02 NOTE — Progress Notes (Addendum)
P Primary GI:   [name] Rusk, MD      Chief Complaint:    Follow up on LLQ discomfort and constipation.   IMPRESSION and PLAN:    #1. 77 yo male seen earlier this month for LLQ discomfort / constipation.    Encouraged increased fluid intake, increased MiraLAX dose. He is back for follow up.     -He is taking MIralax 1 -2 times daily to achieve normal daily BM.  -No further LLQ tenderness / discomfort.  #2.  History of adenomatous colon polyps.  Last colonoscopy late April 2016 with findings of one tubular adenoma and one sessile serrated polyp (both < 1 cm). Surveillance colonoscopy not recommended but patient wants to remain in surveillance program.      Agree with Ms. Vanita Ingles assessment and plan.  I do not recommend repeating a colonoscopy given age and prior findings. 2021 would be next routine time - will place a recall to discuss things  [name] Mayer, MD, [facility]    HPI:     Patient is a 77 year old male with hypertension, CAD/stents, DM 2 and anxiety.  He also has history of colon polyps.  I saw patient earlier this month after PCP referred him for abnormal abdominal exam.  Apparently patient was tender in left lower quadrant when examined by PCP.  When I saw patient in clinic there was some concern that he may be constipated.  I increase MiraLAX dose, he is back for follow-up.  Patient is taking MiraLAX 1-2 times a day to achieve normal daily bowel movement.  He has not had any LLQ pain. No GI complaints  Review of systems:     No chest pain, no SOB, no fevers, no urinary sx   Past Medical History:  Diagnosis Date  . Anxiety disorder   . Arthritis   . Chronic headaches   . Coronary atherosclerosis of native coronary artery    BMS nondominant RCA 12/2004  . Essential hypertension, benign   . GERD (gastroesophageal reflux disease)   . History of adenomatous polyp of colon   . History of kidney stones   . History of MI (myocardial infarction) 12/2004   s/p  BMS  to RCA  . Hyperlipidemia   . Internal hemorrhoids   . Nocturia more than twice per night   . Numbness of left foot   . S/p bare metal coronary artery stent 12/2004   BMS x1 to RCA  . Spinal stenosis   . Type 2 diabetes mellitus treated with insulin ([place])   . Urgency of urination   . Wears dentures    upper    Patient's surgical history, family medical history, social history, medications and allergies were all reviewed in Epic   Creatinine clearance cannot be calculated (Patient's most recent lab result is older than the maximum 21 days allowed.)  Current Outpatient Medications  Medication Sig Dispense Refill  . acetaminophen (TYLENOL) 650 MG CR tablet Take 1,300 mg by mouth 2 (two) times daily as needed for pain.     Marland Kitchen amLODipine (NORVASC) 5 MG tablet TAKE 1 TABLET BY MOUTH  DAILY 90 tablet 0  . aspirin 81 MG tablet Take 1 tablet (81 mg total) by mouth every evening. Resume 4 days post-op 30 tablet   . aspirin-acetaminophen-caffeine (EXCEDRIN EXTRA STRENGTH) 250-250-65 MG tablet Take 1 tablet by mouth daily as needed (headaches). May resume 5 days post-op as needed 30 tablet 0  . baclofen (LIORESAL) 10 MG tablet Take  1 tablet (10 mg total) by mouth at bedtime as needed for muscle spasms. 30 each 0  . butalbital-acetaminophen-caffeine (FIORICET, ESGIC) 50-325-40 MG tablet Take 1 tablet by mouth 2 (two) times daily as needed for headache. 14 tablet 0  . cetirizine (ZYRTEC) 10 MG tablet Take 10 mg by mouth every evening.     . Cholecalciferol (VITAMIN D3) 2000 UNITS TABS Take 2,000 Units by mouth every morning.     . docusate sodium (COLACE) 100 MG capsule Take 1 capsule (100 mg total) by mouth 2 (two) times daily. 60 capsule 2  . famotidine (PEPCID) 20 MG tablet TAKE 1 TABLET BY MOUTH TWO  TIMES DAILY 180 tablet 0  . HUMALOG KWIKPEN 100 UNIT/ML KiwkPen INJECT 0 TO 15 UNITS  SUBCUTANEOUSLY 3 TIMES  DAILY BEFORE MEALS  (70-150=8 UNITS, 151-200=9  UNITS) (Patient taking differently: inject  10-12 units before each meal) 45 mL 2  . HYDROcodone-acetaminophen (NORCO) 10-325 MG tablet Take 1 tablet by mouth every 4 (four) hours as needed for severe pain. 40 tablet 0  . Insulin Pen Needle (B-D ULTRAFINE III SHORT PEN) 31G X 8 MM MISC USE 4 TIMES DAILY DX E11.49 400 each 1  . LANTUS SOLOSTAR 100 UNIT/ML Solostar Pen INJECT SUBCUTANEOUSLY 52  UNITS AT BEDTIME 60 mL 2  . losartan (COZAAR) 50 MG tablet TAKE 1 TABLET BY MOUTH  DAILY 90 tablet 0  . Menthol, Topical Analgesic, (BLUE-EMU MAXIMUM STRENGTH EX) Apply 1 application topically daily as needed (muscle pain).    . metFORMIN (GLUCOPHAGE-XR) 500 MG 24 hr tablet TAKE 2 TABLETS BY MOUTH TWO TIMES DAILY 360 tablet 0  . nitroGLYCERIN (NITROSTAT) 0.4 MG SL tablet DISSOLVE ONE TABLET UNDER THE TONGUE EVERY 5 MINUTES AS NEEDED FOR CHEST PAIN.  DO NOT EXCEED A TOTAL OF 3 DOSES IN 15 MINUTES 25 tablet 3  . ONE TOUCH ULTRA TEST test strip USE ONE STRIP TO CHECK GLUCOSE 4 TIMES DAILY 400 each 1  . PARoxetine (PAXIL) 20 MG tablet TAKE 1 TABLET BY MOUTH  DAILY 90 tablet 0  . polyethylene glycol (MIRALAX / GLYCOLAX) packet Take 17 g by mouth daily. 14 each 0  . tamsulosin (FLOMAX) 0.4 MG CAPS capsule TAKE 1 CAPSULE BY MOUTH  DAILY (Patient taking differently: TAKE 1 CAPSULE BY MOUTH  DAILY IN THE EVENING) 90 capsule 3   No current facility-administered medications for this visit.     Physical Exam:     BP 134/70   Pulse 85   Ht 5\' 6"  (1.676 m)   Wt 221 lb (100.2 kg)   BMI 35.67 kg/m   GENERAL:  Pleasant male in NAD PSYCH: : Cooperative, normal affect ABDOMEN:  Nondistended, soft, nontender. No obvious masses,  normal bowel sounds SKIN:  turgor, no lesions seen Musculoskeletal:  Normal muscle tone, normal strength NEURO: Alert and oriented x 3, no focal neurologic deficits   Tye Savoy , NP 09/02/2017, 10:08 AM

## 2017-09-05 ENCOUNTER — Encounter: Payer: Self-pay | Admitting: Neurology

## 2017-09-05 ENCOUNTER — Ambulatory Visit: Payer: Medicare Other | Admitting: Neurology

## 2017-09-05 VITALS — BP 159/81 | Ht 66.0 in | Wt 221.0 lb

## 2017-09-05 DIAGNOSIS — G4719 Other hypersomnia: Secondary | ICD-10-CM | POA: Diagnosis not present

## 2017-09-05 DIAGNOSIS — R0683 Snoring: Secondary | ICD-10-CM

## 2017-09-05 DIAGNOSIS — R351 Nocturia: Secondary | ICD-10-CM | POA: Diagnosis not present

## 2017-09-05 DIAGNOSIS — E669 Obesity, unspecified: Secondary | ICD-10-CM | POA: Diagnosis not present

## 2017-09-05 NOTE — Patient Instructions (Signed)

## 2017-09-05 NOTE — Progress Notes (Signed)
Subjective:    Patient ID: [name] is a 77 y.o. male.  HPI     Star Age, MD, PhD [facility] [address], [address] [place], [place] [address]  Dear Dr. Warrick Parisian,   I saw your patient, [name], upon your kind request in my neurologic clinic today for initial consultation of his sleep disorder, in particular, concern for underlying obstructive sleep apnea. The patient is accompanied by his wife today. As you know, [name] is a 77 year old right-handed gentleman with an underlying medical history of hypertension, CAD with s/p MI and stent placement, s/p multiple surgeries, including tonsillectomy as a child, cholecystectomy, low back surgery, right total hip replacement surgery, bilateral rotator cuff surgeries bilateral knee replacement surgeries, hyperlipidemia, diabetes, migraine headaches (seen by Dr. Jannifer Franklin in the past), BPH, allergies and obesity, who reports snoring and excessive daytime somnolence. His Epworth sleepiness score is 7 out of 24, fatigue score is 24 out of 63. He is retired, lives with his wife, they have 1 child. He quit smoking in 1990 and drinks alcohol infrequently and does not utilize caffeine on a regular or daily basis. I reviewed your office note from 07/30/2011. He falls asleep during the day, typically in his recliner. His bedtime is around 10 and rise time around 8. He reports lack of energy and lack of motivation to do things that he previously enjoyed such as hunting. He is not aware of any family history of OSA. He denies morning headaches but has significant nocturia about 3 times per night on average.  His Past Medical History Is Significant For: Past Medical History:  Diagnosis Date  . Anxiety disorder   . Arthritis   . Chronic headaches   . Coronary atherosclerosis of native coronary artery    BMS nondominant RCA 12/2004  . Essential hypertension, benign   . GERD (gastroesophageal reflux  disease)   . History of adenomatous polyp of colon   . History of kidney stones   . History of MI (myocardial infarction) 12/2004   s/p  BMS to RCA  . Hyperlipidemia   . Internal hemorrhoids   . Nocturia more than twice per night   . Numbness of left foot   . S/p bare metal coronary artery stent 12/2004   BMS x1 to RCA  . Spinal stenosis   . Type 2 diabetes mellitus treated with insulin ([place])   . Urgency of urination   . Wears dentures    upper    His Past Surgical History Is Significant For: Past Surgical History:  Procedure Laterality Date  . CARDIAC CATHETERIZATION  06-14-2004  dr Lia Foyer   total occlusion mD1, RI 30-40%, mRCA nondominant hazy 80% and scattered 30-40%,  ef 71% (medical mangement)  . CARDIOVASCULAR STRESS TEST  09-17-2015   dr Domenic Polite   Low risk nuclear study w/ reversible mild small anteroapical defect,  normal LV function and wall motion, nuclear stress ef 61%  . CATARACT EXTRACTION W/ INTRAOCULAR LENS IMPLANT Right 07/2014  . COLONOSCOPY  09/10/2008   normal  . CORONARY ANGIOPLASTY WITH STENT PLACEMENT  12-17-2004   dr benismhon/ dr Olevia Perches   nonobstructive cad LAD and LCFx,  BMS x1 to total occlusion RCA   . CYSTOSCOPY W/ URETEROSCOPY W/ LITHOTRIPSY  08/2010  . ERCP  03/14/2008  . LAPAROSCOPIC CHOLECYSTECTOMY  05/2012  . LEFT HEART CATHETERIZATION WITH CORONARY ANGIOGRAM N/A 05/14/2011   Procedure: LEFT HEART CATHETERIZATION WITH CORONARY ANGIOGRAM;  Surgeon: Sherren Mocha, MD;  Location: [facility];  Service: Cardiovascular;  Laterality: N/A;    non-obstructive LM, LAD, LCFx, patent RCA stent  . LUMBAR LAMINECTOMY/DECOMPRESSION MICRODISCECTOMY N/A 02/24/2017   Procedure: Microlumbar decompression L4-5, L5-S1;  Surgeon: Susa Day, MD;  Location: [facility];  Service: Orthopedics;  Laterality: N/A;  120 mins  . ROTATOR CUFF REPAIR Right 03/2002  . ROTATOR CUFF REPAIR Left 12/11/2015  . TOTAL HIP ARTHROPLASTY Right 12/20/2008  . TOTAL KNEE ARTHROPLASTY  Bilateral left 12-11-2003/  right 07-31-2004   dr Wynelle Link [facility]  . UPPER GASTROINTESTINAL ENDOSCOPY  01/08/2008   bx, inlet patch, duodenitis  . YAG LASER APPLICATION Right 7/79/3903   Procedure: YAG LASER APPLICATION;  Surgeon: Williams Che, MD;  Location: [facility];  Service: Ophthalmology;  Laterality: Right;    His Family History Is Significant For: Family History  Problem Relation Age of Onset  . Colon cancer Mother        Diagnosed age 34  . Cancer Mother 66  . Heart disease Father   . Heart attack Father   . Breast cancer Sister   . Heart disease Brother   . Brain cancer Sister   . Heart disease Brother   . Bladder Cancer Brother   . Cancer - Other Brother     His Social History Is Significant For: Social History   Socioeconomic History  . Marital status: Married    Spouse name: Not on file  . Number of children: 0  . Years of education: HS  . Highest education level: High school graduate  Occupational History  . Occupation: RETIRED    Comment: Power plant  Social Needs  . Financial resource strain: Not hard at all  . Food insecurity:    Worry: Never true    Inability: Never true  . Transportation needs:    Medical: No    Non-medical: No  Tobacco Use  . Smoking status: Former Smoker    Packs/day: 1.00    Years: 20.00    Pack years: 20.00    Types: Cigarettes    Start date: 01/18/1963    Last attempt to quit: 08/11/1988    Years since quitting: 29.0  . Smokeless tobacco: Former Systems developer    Types: Chew    Quit date: 01/11/1989  . Tobacco comment: chewed 1 pack tobacco/day for 15 years  Substance and Sexual Activity  . Alcohol use: No    Alcohol/week: 0.0 standard drinks    Frequency: Never  . Drug use: No  . Sexual activity: Yes  Lifestyle  . Physical activity:    Days per week: Not on file    Minutes per session: Not on file  . Stress: Not at all  Relationships  . Social connections:    Talks on phone: More than three times a week    Gets together:  More than three times a week    Attends religious service: Never    Active member of club or organization: No    Attends meetings of clubs or organizations: Never    Relationship status: Married  Other Topics Concern  . Not on file  Social History Narrative   No regular exercise      Daily caffeine use: 3 cups   Patient is right handed.       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 Ace Inhibitors Hives, Swelling and Rash    Rash,hives,tongue swelling  .  Angiotensin Receptor Blockers     Unknown reaction   . Ciprofloxacin Other (See Comments)    confusion  . Other Hives and Other (See Comments)    Altaseptic, altase (cough)  . Oxycodone-Acetaminophen Other (See Comments)    REACTION: unknown reaction  . Ramipril Cough  . Robaxin [Methocarbamol] Other (See Comments)    Confusion   . Toradol [Ketorolac Tromethamine] Other (See Comments)    confusion  . Valium Other (See Comments)    Hallucinations; confusion  . Doxycycline Hives and Rash  :   His Current Medications Are:  Outpatient Encounter Medications as of 09/05/2017  Medication Sig  . acetaminophen (TYLENOL) 650 MG CR tablet Take 1,300 mg by mouth 2 (two) times daily as needed for pain.   Marland Kitchen amLODipine (NORVASC) 5 MG tablet TAKE 1 TABLET BY MOUTH  DAILY  . aspirin 81 MG tablet Take 1 tablet (81 mg total) by mouth every evening. Resume 4 days post-op  . aspirin-acetaminophen-caffeine (EXCEDRIN EXTRA STRENGTH) 250-250-65 MG tablet Take 1 tablet by mouth daily as needed (headaches). May resume 5 days post-op as needed  . atorvastatin (LIPITOR) 20 MG tablet TAKE 1 TABLET BY MOUTH  DAILY AT 6 PM.  . baclofen (LIORESAL) 10 MG tablet Take 1 tablet (10 mg total) by mouth at bedtime as needed for muscle spasms.  . butalbital-acetaminophen-caffeine (FIORICET, ESGIC) 50-325-40 MG tablet Take 1 tablet by mouth 2 (two) times daily as needed  for headache.  . cetirizine (ZYRTEC) 10 MG tablet Take 10 mg by mouth every evening.   . chlorthalidone (HYGROTON) 25 MG tablet TAKE 1 TABLET BY MOUTH  DAILY AS DIRECTED  . Cholecalciferol (VITAMIN D3) 2000 UNITS TABS Take 2,000 Units by mouth every morning.   . docusate sodium (COLACE) 100 MG capsule Take 1 capsule (100 mg total) by mouth 2 (two) times daily.  . famotidine (PEPCID) 20 MG tablet TAKE 1 TABLET BY MOUTH TWO  TIMES DAILY  . HUMALOG KWIKPEN 100 UNIT/ML KiwkPen INJECT 0 TO 15 UNITS  SUBCUTANEOUSLY 3 TIMES  DAILY BEFORE MEALS  (70-150=8 UNITS, 151-200=9  UNITS) (Patient taking differently: inject 10-12 units before each meal)  . HYDROcodone-acetaminophen (NORCO) 10-325 MG tablet Take 1 tablet by mouth every 4 (four) hours as needed for severe pain.  . Insulin Pen Needle (B-D ULTRAFINE III SHORT PEN) 31G X 8 MM MISC USE 4 TIMES DAILY DX E11.49  . LANTUS SOLOSTAR 100 UNIT/ML Solostar Pen INJECT SUBCUTANEOUSLY 52  UNITS AT BEDTIME  . losartan (COZAAR) 50 MG tablet TAKE 1 TABLET BY MOUTH  DAILY  . Menthol, Topical Analgesic, (BLUE-EMU MAXIMUM STRENGTH EX) Apply 1 application topically daily as needed (muscle pain).  . metFORMIN (GLUCOPHAGE-XR) 500 MG 24 hr tablet TAKE 2 TABLETS BY MOUTH TWO TIMES DAILY  . nitroGLYCERIN (NITROSTAT) 0.4 MG SL tablet DISSOLVE ONE TABLET UNDER THE TONGUE EVERY 5 MINUTES AS NEEDED FOR CHEST PAIN.  DO NOT EXCEED A TOTAL OF 3 DOSES IN 15 MINUTES  . ONE TOUCH ULTRA TEST test strip USE 1 STRIP TO CHECK GLUCOSE 4 TIMES DAILY  . PARoxetine (PAXIL) 20 MG tablet TAKE 1 TABLET BY MOUTH  DAILY  . polyethylene glycol (MIRALAX / GLYCOLAX) packet Take 17 g by mouth daily.  . tamsulosin (FLOMAX) 0.4 MG CAPS capsule TAKE 1 CAPSULE BY MOUTH  DAILY  . [DISCONTINUED] ONE TOUCH ULTRA TEST test strip USE ONE STRIP TO CHECK GLUCOSE 4 TIMES DAILY   No facility-administered encounter medications on file as of 09/05/2017.   :  Review of Systems:  Out of a complete 14 point review  of systems, all are reviewed and negative with the exception of these symptoms as listed below: Review of Systems  Neurological:       Pt presents today to discuss his sleep. Pt has never had a sleep study. Pt does not endorse snoring.  Epworth Sleepiness Scale 0= would never doze 1= slight chance of dozing 2= moderate chance of dozing 3= high chance of dozing  Sitting and reading: 1 Watching TV: 1 Sitting inactive in a public place (ex. Theater or meeting): 1 As a passenger in a car for an hour without a break: 1 Lying down to rest in the afternoon: 2 Sitting and talking to someone: 0 Sitting quietly after lunch (no alcohol): 1 In a car, while stopped in traffic: 0 Total: 7     Objective:  Neurological Exam  Physical Exam Physical Examination:   Vitals:   09/05/17 0901  BP: (!) 159/81   General Examination: The patient is a very pleasant 77 y.o. male in no acute distress. He appears well-developed and well-nourished and well groomed.   HEENT: Normocephalic, atraumatic, pupils are equal, round and reactive to light and accommodation. Extraocular tracking is good without limitation to gaze excursion or nystagmus noted. Normal smooth pursuit is noted. Hearing is grossly intact. Face is symmetric with normal facial animation and normal facial sensation. Speech is clear with no dysarthria noted. There is no hypophonia. There is no lip, neck/head, jaw or voice tremor. Neck is supple with full range of passive and active motion. There are no carotid bruits on auscultation. Oropharynx exam reveals: mild mouth dryness, adequate dental hygiene, with full dentures on top and permanent bridge in the front on the bottom. He has moderate airway crowding secondary smaller airway entry and redundant soft palate, elongated uvula. Mallampati is class II. Tonsils are absent. Neck circumference is 18-1/2 inches.  Chest: Clear to auscultation without wheezing, rhonchi or crackles noted.  Heart:  S1+S2+0, regular and normal without murmurs, rubs or gallops noted.   Abdomen: Soft, non-tender and non-distended with normal bowel sounds appreciated on auscultation.  Extremities: There is no pitting edema in the distal lower extremities bilaterally. Pedal pulses are intact.  Skin: Warm and dry without trophic changes noted.  Musculoskeletal: exam reveals no obvious joint deformities, tenderness or joint swelling or erythema. He is status post right total hip replacement, he is status post bilateral knee replacement surgeries with unremarkable scars noted.  Neurologically:  Mental status: The patient is awake, alert and oriented in all 4 spheres. His immediate and remote memory, attention, language skills and fund of knowledge are appropriate. There is no evidence of aphasia, agnosia, apraxia or anomia. Speech is clear with normal prosody and enunciation. Thought process is linear. Mood is normal and affect is normal.  Cranial nerves II - XII are as described above under HEENT exam. In addition: shoulder shrug is normal with equal shoulder height noted. Motor exam: Normal bulk, strength and tone is noted. There is no drift, tremor or rebound. Romberg is negative. Reflexes are 1+ in the UEs and absent in the LEs. Fine motor skills and coordination: grossly intact.  Cerebellar testing: No dysmetria or intention tremor on finger to nose testing. Heel to shin is unremarkable bilaterally. There is no truncal or gait ataxia.  Sensory exam: intact to light touch in the upper and lower extremities.  Gait, station and balance: He stands easily. No veering to one side is noted.  No leaning to one side is noted. Posture is age-appropriate and stance is narrow based. Gait shows normal stride length and normal pace. No problems turning are noted.              Assessment and Plan:  In summary, [name] is a very pleasant 77 y.o.-year old male with an underlying medical history of hypertension, CAD  with s/p MI and stent placement, s/p multiple surgeries, including tonsillectomy as a child, cholecystectomy, low back surgery, right total hip replacement surgery, bilateral rotator cuff surgeries bilateral knee replacement surgeries, hyperlipidemia, diabetes, migraine headaches (seen by Dr. Jannifer Franklin in the past), BPH, allergies and obesity, whose history and physical exam are concerning for obstructive sleep apnea (OSA). I had a long chat with the patient and his wife about my findings and the diagnosis of OSA, its prognosis and treatment options. We talked about medical treatments, surgical interventions and non-pharmacological approaches. I explained in particular the risks and ramifications of untreated moderate to severe OSA, especially with respect to developing cardiovascular disease down the Road, including congestive heart failure, difficult to treat hypertension, cardiac arrhythmias, or stroke. Even type 2 diabetes has, in part, been linked to untreated OSA. Symptoms of untreated OSA include daytime sleepiness, memory problems, mood irritability and mood disorder such as depression and anxiety, lack of energy, as well as recurrent headaches, especially morning headaches. We talked about trying to maintain a healthy lifestyle in general, as well as the importance of weight control. I encouraged the patient to eat healthy, exercise daily and keep well hydrated, to keep a scheduled bedtime and wake time routine, to not skip any meals and eat healthy snacks in between meals. I advised the patient not to drive when feeling sleepy. I recommended the following at this time: sleep study with potential positive airway pressure titration. (We will score hypopneas at 4%).   I explained the sleep test procedure to the patient and also outlined possible surgical and non-surgical treatment options of OSA. I also explained the CPAP treatment option to the patient, who indicated that he would be willing to try CPAP if  the need arises. I explained the importance of being compliant with PAP treatment, not only for insurance purposes but primarily to improve His symptoms, and for the patient's long term health benefit, including to reduce His cardiovascular risks. I answered all their questions today and the patient and his wife were in agreement. I plan to see him back after the sleep study is completed and encouraged him to call with any interim questions, concerns, problems or updates.   Thank you very much for allowing me to participate in the care of this nice patient. If I can be of any further assistance to you please do not hesitate to call me at [phone].  Sincerely,   Star Age, MD, PhD

## 2017-09-15 NOTE — Progress Notes (Signed)
Cardiology Office Note  Date: 09/16/2017   ID: [name], DOB May 12, 1940, MRN [id number]  PCP: Dettinger, [name] Kaufmann, MD  Primary Cardiologist: [name] Lesches, MD   Chief Complaint  Patient presents with  . Coronary Artery Disease    History of Present Illness: [name] is a 77 y.o. male last seen in March.  He is here for routine follow-up visit.  He does not report any angina symptoms or nitroglycerin use.  He has chronic dyspnea on exertion that has not changed.  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  I went over his cardiac medications which include aspirin, Norvasc, Lipitor which he is tolerating, chlorthalidone, Cozaar, and as needed nitroglycerin.  Lipids from April of this year are outlined below.  Ischemic testing from 2017 was overall low risk as outlined below.  Past Medical History:  Diagnosis Date  . Anxiety disorder   . Arthritis   . Chronic headaches   . Coronary atherosclerosis of native coronary artery    BMS nondominant RCA 12/2004  . Essential hypertension, benign   . GERD (gastroesophageal reflux disease)   . History of adenomatous polyp of colon   . History of kidney stones   . History of MI (myocardial infarction) 12/2004   s/p  BMS to RCA  . Hyperlipidemia   . Internal hemorrhoids   . Nocturia more than twice per night   . Numbness of left foot   . S/p bare metal coronary artery stent 12/2004   BMS x1 to RCA  . Spinal stenosis   . Type 2 diabetes mellitus treated with insulin ([place])   . Urgency of urination   . Wears dentures    upper    Past Surgical History:  Procedure Laterality Date  . CARDIAC CATHETERIZATION  06-14-2004  dr [name]   total occlusion mD1, RI 30-40%, mRCA nondominant hazy 80% and scattered 30-40%,  ef 71% (medical mangement)  . CARDIOVASCULAR STRESS TEST  09-17-2015   dr Domenic Polite   Low risk nuclear study w/ reversible mild  small anteroapical defect,  normal LV function and wall motion, nuclear stress ef 61%  . CATARACT EXTRACTION W/ INTRAOCULAR LENS IMPLANT Right 07/2014  . COLONOSCOPY  09/10/2008   normal  . CORONARY ANGIOPLASTY WITH STENT PLACEMENT  12-17-2004   dr [name]/ dr [name]   nonobstructive cad LAD and LCFx,  BMS x1 to total occlusion RCA   . CYSTOSCOPY W/ URETEROSCOPY W/ LITHOTRIPSY  08/2010  . ERCP  03/14/2008  . LAPAROSCOPIC CHOLECYSTECTOMY  05/2012  . LEFT HEART CATHETERIZATION WITH CORONARY ANGIOGRAM N/A 05/14/2011   Procedure: LEFT HEART CATHETERIZATION WITH CORONARY ANGIOGRAM;  Surgeon: Sherren Mocha, MD;  Location[facility] CATH LAB;  Service: Cardiovascular;  Laterality: N/A;    non-obstructive LM, LAD, LCFx, patent RCA stent  . LUMBAR LAMINECTOMY/DECOMPRESSION MICRODISCECTOMY N/A 02/24/2017   Procedure: Microlumbar decompression L4-5, L5-S1;  Surgeon: [name] Day, MD;  Location: [facility] ORS;  Service: Orthopedics;  Laterality: N/A;  120 mins  . ROTATOR CUFF REPAIR Right 03/2002  . ROTATOR CUFF REPAIR Left 12/11/2015  . TOTAL HIP ARTHROPLASTY Right 12/20/2008  . TOTAL KNEE ARTHROPLASTY Bilateral left 12-11-2003/  right 07-31-2004   dr [name] [facility]  . UPPER GASTROINTESTINAL ENDOSCOPY  01/08/2008   bx, inlet patch, duodenitis  . YAG LASER APPLICATION Right 5/36/6440   Procedure: YAG LASER APPLICATION;  Surgeon: [name] Che, MD;  Location: [facility] ORS;  Service:  Ophthalmology;  Laterality: Right;    Current Outpatient Medications  Medication Sig Dispense Refill  . amLODipine (NORVASC) 5 MG tablet TAKE 1 TABLET BY MOUTH  DAILY 90 tablet 0  . atorvastatin (LIPITOR) 20 MG tablet TAKE 1 TABLET BY MOUTH  DAILY AT 6 PM. 90 tablet 0  . famotidine (PEPCID) 20 MG tablet TAKE 1 TABLET BY MOUTH TWO  TIMES DAILY 180 tablet 0  . losartan (COZAAR) 50 MG tablet TAKE 1 TABLET BY MOUTH  DAILY 90 tablet 0  . metFORMIN (GLUCOPHAGE-XR) 500 MG 24 hr tablet TAKE 2 TABLETS BY MOUTH TWO TIMES DAILY 360 tablet 0  .  nitroGLYCERIN (NITROSTAT) 0.4 MG SL tablet DISSOLVE ONE TABLET UNDER THE TONGUE EVERY 5 MINUTES AS NEEDED FOR CHEST PAIN.  DO NOT EXCEED A TOTAL OF 3 DOSES IN 15 MINUTES 25 tablet 3  . PARoxetine (PAXIL) 20 MG tablet TAKE 1 TABLET BY MOUTH  DAILY 90 tablet 0  . tamsulosin (FLOMAX) 0.4 MG CAPS capsule TAKE 1 CAPSULE BY MOUTH  DAILY 90 capsule 0  . acetaminophen (TYLENOL) 650 MG CR tablet Take 1,300 mg by mouth 2 (two) times daily as needed for pain.     Marland Kitchen aspirin 81 MG tablet Take 1 tablet (81 mg total) by mouth every evening. Resume 4 days post-op 30 tablet   . aspirin-acetaminophen-caffeine (EXCEDRIN EXTRA STRENGTH) 250-250-65 MG tablet Take 1 tablet by mouth daily as needed (headaches). May resume 5 days post-op as needed 30 tablet 0  . baclofen (LIORESAL) 10 MG tablet Take 1 tablet (10 mg total) by mouth at bedtime as needed for muscle spasms. 30 each 0  . butalbital-acetaminophen-caffeine (FIORICET, ESGIC) 50-325-40 MG tablet Take 1 tablet by mouth 2 (two) times daily as needed for headache. 14 tablet 0  . cetirizine (ZYRTEC) 10 MG tablet Take 10 mg by mouth every evening.     . chlorthalidone (HYGROTON) 25 MG tablet TAKE 1 TABLET BY MOUTH  DAILY AS DIRECTED 90 tablet 0  . Cholecalciferol (VITAMIN D3) 2000 UNITS TABS Take 2,000 Units by mouth every morning.     . docusate sodium (COLACE) 100 MG capsule Take 1 capsule (100 mg total) by mouth 2 (two) times daily. 60 capsule 2  . HUMALOG KWIKPEN 100 UNIT/ML KiwkPen INJECT 0 TO 15 UNITS  SUBCUTANEOUSLY 3 TIMES  DAILY BEFORE MEALS  (70-150=8 UNITS, 151-200=9  UNITS) (Patient taking differently: inject 10-12 units before each meal) 45 mL 2  . HYDROcodone-acetaminophen (NORCO) 10-325 MG tablet Take 1 tablet by mouth every 4 (four) hours as needed for severe pain. 40 tablet 0  . Insulin Pen Needle (B-D ULTRAFINE III SHORT PEN) 31G X 8 MM MISC USE 4 TIMES DAILY DX E11.49 400 each 1  . LANTUS SOLOSTAR 100 UNIT/ML Solostar Pen INJECT SUBCUTANEOUSLY 52  UNITS  AT BEDTIME 60 mL 2  . Menthol, Topical Analgesic, (BLUE-EMU MAXIMUM STRENGTH EX) Apply 1 application topically daily as needed (muscle pain).    . ONE TOUCH ULTRA TEST test strip USE 1 STRIP TO CHECK GLUCOSE 4 TIMES DAILY 400 each 3  . polyethylene glycol (MIRALAX / GLYCOLAX) packet Take 17 g by mouth daily. 14 each 0   No current facility-administered medications for this visit.    Allergies:  Ace inhibitors; Angiotensin receptor blockers; Ciprofloxacin; Other; Oxycodone-acetaminophen; Ramipril; Robaxin [methocarbamol]; Toradol [ketorolac tromethamine]; Valium; and Doxycycline   Social History: The patient  reports that he quit smoking about 29 years ago. His smoking use included cigarettes. He started smoking about  54 years ago. He has a 20.00 pack-year smoking history. He quit smokeless tobacco use about 28 years ago.  His smokeless tobacco use included chew. He reports that he does not drink alcohol or use drugs.   ROS:  Please see the history of present illness. Otherwise, complete review of systems is positive for hearing loss, arthritic stiffness.  All other systems are reviewed and negative.   Physical Exam: VS:  BP 138/64   Pulse 88   Ht 5\' 6"  (1.676 m)   Wt 223 lb 6.4 oz (101.3 kg)   SpO2 97%   BMI 36.06 kg/m , BMI Body mass index is 36.06 kg/m.  Wt Readings from Last 3 Encounters:  09/16/17 223 lb 6.4 oz (101.3 kg)  09/05/17 221 lb (100.2 kg)  09/02/17 221 lb (100.2 kg)    General: Patient appears comfortable at rest. HEENT: Conjunctiva and lids normal, oropharynx clear. Neck: Supple, no elevated JVP or carotid bruits, no thyromegaly. Lungs: Clear to auscultation, nonlabored breathing at rest. Cardiac: Regular rate and rhythm, no S3 or significant systolic murmur. Abdomen: Obese, nontender, bowel sounds present. Extremities: No pitting edema, distal pulses 2+. Skin: Warm and dry. Musculoskeletal: No kyphosis. Neuropsychiatric: Alert and oriented x3, affect grossly  appropriate.  ECG: I personally reviewed the tracing from 03/14/2017 which showed sinus rhythm.  Recent Labwork: 04/28/2017: TSH 1.530 07/29/2017: ALT 17; AST 13; BUN 18; Creatinine, Ser 1.07; Hemoglobin 14.6; Platelets 276; Potassium 4.5; Sodium 140  April 2019: Cholesterol 128, triglycerides 129, HDL 34, LDL 68  Other Studies Reviewed Today:  Lexiscan Myoview 09/17/2015:  No diagnostic ST segment changes to indicate ischemia.  Small, mild intensity, reversible anteroapical defect consistent with a small region of ischemia.  This is a low risk study.  Nuclear stress EF: 61%.  Assessment and Plan:  1.  CAD with history of BMS to nondominant RCA in 2006 with moderate residual disease that has been managed medically.  He does not report any progressive angina symptoms on medical therapy and had a relatively low risk Myoview in 2017.  Plan to continue observation for now.  2.  Mixed hyperlipidemia, tolerating Lipitor at this point.  Lipid panel back in April showed LDL 68.  3.  Essential hypertension, no changes in current regimen.  Keep follow-up with PCP.  4.  Type 2 diabetes mellitus, currently on Glucophage and insulin.  Keep follow-up with PCP.  Current medicines were reviewed with the patient today.  Disposition: Follow-up in 6 months.  Signed, Satira Sark, MD, [facility] 09/16/2017 11:49 AM    [facility] [place], [place],  [address] Phone: [phone]; Fax: [phone]

## 2017-09-16 ENCOUNTER — Encounter: Payer: Self-pay | Admitting: Cardiology

## 2017-09-16 ENCOUNTER — Ambulatory Visit: Payer: Medicare Other | Admitting: Cardiology

## 2017-09-16 VITALS — BP 138/64 | HR 88 | Ht 66.0 in | Wt 223.4 lb

## 2017-09-16 DIAGNOSIS — E1159 Type 2 diabetes mellitus with other circulatory complications: Secondary | ICD-10-CM

## 2017-09-16 DIAGNOSIS — I25119 Atherosclerotic heart disease of native coronary artery with unspecified angina pectoris: Secondary | ICD-10-CM

## 2017-09-16 DIAGNOSIS — I10 Essential (primary) hypertension: Secondary | ICD-10-CM

## 2017-09-16 DIAGNOSIS — E782 Mixed hyperlipidemia: Secondary | ICD-10-CM | POA: Diagnosis not present

## 2017-09-16 NOTE — Patient Instructions (Signed)

## 2017-09-18 ENCOUNTER — Ambulatory Visit (INDEPENDENT_AMBULATORY_CARE_PROVIDER_SITE_OTHER): Payer: Medicare Other | Admitting: Neurology

## 2017-09-18 DIAGNOSIS — E669 Obesity, unspecified: Secondary | ICD-10-CM

## 2017-09-18 DIAGNOSIS — G4733 Obstructive sleep apnea (adult) (pediatric): Secondary | ICD-10-CM | POA: Diagnosis not present

## 2017-09-18 DIAGNOSIS — R351 Nocturia: Secondary | ICD-10-CM

## 2017-09-18 DIAGNOSIS — R0683 Snoring: Secondary | ICD-10-CM

## 2017-09-18 DIAGNOSIS — G4719 Other hypersomnia: Secondary | ICD-10-CM

## 2017-09-18 DIAGNOSIS — G4761 Periodic limb movement disorder: Secondary | ICD-10-CM

## 2017-09-18 DIAGNOSIS — G4720 Circadian rhythm sleep disorder, unspecified type: Secondary | ICD-10-CM

## 2017-09-20 ENCOUNTER — Telehealth: Payer: Self-pay

## 2017-09-20 NOTE — Procedures (Signed)
PATIENT'S NAME:  [name], [name] DOB:      09-Jan-1941      MR#:    [id number]     DATE OF RECORDING: 09/18/2017 REFERRING M.D.:  Caryl Pina, MD Study Performed:   Baseline Polysomnogram HISTORY: 77 year old man with a history of hypertension, CAD with s/p MI and stent placement, s/p multiple surgeries, hyperlipidemia, diabetes, migraines, BPH, allergies and obesity, who reports snoring and excessive daytime somnolence. His Epworth sleepiness score is 7 out of 24, weight 221 pounds with a height of 66 (inches), resulting in a BMI of 35.4 kg/m2. The patient's neck circumference measured 18.5 inches.  CURRENT MEDICATIONS: Tylenol, Norvasc, Aspirin, Excedrin, Lipitor, [place], Fioricet, Zyrtec, [place], Vitamin D3, Colace, Pepcid, Norco, Lantus, Cozaar, Glucophage, Nitrostat, Paxil, Miralax, Flomax,   PROCEDURE:  This is a multichannel digital polysomnogram utilizing the Somnostar 11.2 system.  Electrodes and sensors were applied and monitored per AASM Specifications.   EEG, EOG, Chin and Limb EMG, were sampled at 200 Hz.  ECG, Snore and Nasal Pressure, Thermal Airflow, Respiratory Effort, CPAP Flow and Pressure, Oximetry was sampled at 50 Hz. Digital video and audio were recorded.      BASELINE STUDY  Lights Out was at 22:05 and Lights On at 04:59.  Total recording time (TRT) was 414.5 minutes, with a total sleep time (TST) of 331.5 minutes.   The patient's sleep latency was normal. REM latency was 381 minutes, which is markedly delayed. The sleep efficiency was 80. %.     SLEEP ARCHITECTURE: WASO (Wake after sleep onset) was 71 minutes with two long periods of wakefulness and otherwise no significant sleep fragmentation noted. There were 6.5 minutes in Stage N1, 268 minutes Stage N2, 35.5 minutes Stage N3 and 21.5 minutes in Stage REM.  The percentage of Stage N1 was 2.%, Stage N2 was 80.8%, which is markedly increased, stage N3 was 10.7% and Stage R (REM sleep) was 6.5%, which is markedly  reduced. The arousals were noted as: 42 were spontaneous, 43 were associated with PLMs, 103 were associated with respiratory events.  RESPIRATORY ANALYSIS:  There were a total of 147 respiratory events:  57 obstructive apneas, 24 central apneas and 0 mixed apneas with a total of 81 apneas and an apnea index (AI) of 14.7 /hour. There were 66 hypopneas with a hypopnea index of 11.9 /hour. The patient also had 0 respiratory event related arousals (RERAs).      The total APNEA/HYPOPNEA INDEX (AHI) was 26.6 /hour and the total RESPIRATORY DISTURBANCE INDEX was 26.6 /hour.  3 events occurred in REM sleep and 150 events in NREM. The REM AHI was 8.4/hour, versus a non-REM AHI of 27.9. The patient spent 38 minutes of total sleep time in the supine position and 294 minutes in non-supine. The supine AHI was 79.0/hour versus a non-supine AHI of 19.9.  OXYGEN SATURATION & C02:  The Wake baseline 02 saturation was 92%, with the lowest being 86%. Time spent below 89% saturation equaled 26 minutes.  PERIODIC LIMB MOVEMENTS: The patient had a total of 423 Periodic Limb Movements.  The Periodic Limb Movement (PLM) index was 76.6 and the PLM Arousal index was 7.8/hour.  Audio and video analysis did not show any abnormal or unusual movements, behaviors, phonations or vocalizations. The patient took 2 bathroom breaks. Moderate snoring was noted. The EKG was in keeping with normal sinus rhythm (NSR). Post-study, the patient indicated that sleep was worse than usual.   IMPRESSION: 1. Obstructive Sleep Apnea (OSA) 2. Periodic Limb Movement  Disorder (PLMD) 3. Dysfunctions associated with sleep stages or arousal from sleep  RECOMMENDATIONS: 1. This study demonstrates moderate to severe obstructive sleep apnea, with a total AHI of 26.6/hour, REM AHI of 8.4/hour, supine AHI of 79/hour and O2 nadir of 86%. Treatment with positive airway pressure in the form of CPAP is recommended. This will require a full night titration  study to optimize therapy. Other treatment options may include avoidance of supine sleep position along with weight loss, upper airway or jaw surgery in selected patients or the use of an oral appliance in certain patients. ENT evaluation and/or consultation with a maxillofacial surgeon or dentist may be feasible in some instances. Please note that untreated obstructive sleep apnea may carry additional perioperative morbidity. Patients with significant obstructive sleep apnea should receive perioperative PAP therapy and the surgeons and particularly the anesthesiologist should be informed of the diagnosis and the severity of the sleep disordered breathing. 2. Severe PLMs (periodic limb movements of sleep) were noted during this study with mild arousals; clinical correlation is recommended. Medication effect from the antidepressant medication should be considered. PLMs may improve with OSA therapy. 3. This study shows abnormal sleep stage percentages; these are nonspecific findings and per se do not signify an intrinsic sleep disorder or a cause for the patient's sleep-related symptoms. Causes include (but are not limited to) the first night effect of the sleep study, circadian rhythm disturbances, medication effect or an underlying mood disorder or medical problem.  4. The patient should be cautioned not to drive, work at heights, or operate dangerous or heavy equipment when tired or sleepy. Review and reiteration of good sleep hygiene measures should be pursued with any patient. 5. The patient will be seen in follow-up in the sleep clinic at [facility] for discussion of the test results, symptom and treatment compliance review, further management strategies, etc. The referring provider will be notified of the test results.  I certify that I have reviewed the entire raw data recording prior to the issuance of this report in accordance with the Standards of Accreditation of the American Academy of Sleep Medicine  (AASM)   Star Age, MD, PhD Diplomat, American Board of Neurology and Sleep Medicine (Neurology and Sleep Medicine)

## 2017-09-20 NOTE — Progress Notes (Signed)
Patient referred by Dr. Warrick Parisian, seen by me on 09/05/17, diagnostic PSG on 09/18/17.   Please call and notify the patient that the recent sleep study showed moderate to severe obstructive sleep apnea, with a total AHI of 26.6/hour, REM AHI of 8.4/hour, supine AHI of 79/hour and O2 nadir of 86%. I recommend treatment for this in the form of CPAP. This will require a repeat sleep study for proper titration and mask fitting and correct monitoring of the oxygen saturations. Please explain to patient. I have placed an order in the chart. Thanks.  Star Age, MD, PhD [facility])

## 2017-09-20 NOTE — Telephone Encounter (Signed)
-----   Message from Star Age, MD sent at 09/20/2017  8:21 AM EDT ----- Patient referred by Dr. Warrick Parisian, seen by me on 09/05/17, diagnostic PSG on 09/18/17.   Please call and notify the patient that the recent sleep study showed moderate to severe obstructive sleep apnea, with a total AHI of 26.6/hour, REM AHI of 8.4/hour, supine AHI of 79/hour and O2 nadir of 86%. I recommend treatment for this in the form of CPAP. This will require a repeat sleep study for proper titration and mask fitting and correct monitoring of the oxygen saturations. Please explain to patient. I have placed an order in the chart. Thanks.  Star Age, MD, PhD [facility])

## 2017-09-20 NOTE — Telephone Encounter (Signed)
I called pt and explained his sleep study results. Pt reports that he has severe claustrophobia and is refusing to try a cpap. I explained with pt the risks of untreated sleep apnea. Pt says that he follows up with his cardiologist and PCP regularly. Pt verbalized understanding the risks of untreated osa but is still refusing to undergo a cpap titration. Pt is agreeable to an appt on 11/10/17 at 11:30am to discuss his sleep study results in more detail. Pt asked that I send him a copy of his sleep study, and I verified with the pt that the address we have on file is correct. Pt verbalized understanding of results. Pt had no questions at this time but was encouraged to call back if questions arise.  I have also mailed the pt a letter reminding him of his appt date and time, and of the risks of untreated osa.

## 2017-09-20 NOTE — Addendum Note (Signed)
Addended by: Star Age on: 09/20/2017 08:21 AM   Modules accepted: Orders

## 2017-09-22 ENCOUNTER — Institutional Professional Consult (permissible substitution): Payer: Medicare Other | Admitting: Neurology

## 2017-09-29 NOTE — Telephone Encounter (Signed)
Pt called the sleep lab asked for a sooner appt with Dr. Rexene Alberts. I called pt, spoke to pt's wife. Offered her an appt for pt on 10/06/17 at 2:00pm, check in at 1:30 pm, and will cancel the 11/10/17 appt. Pt's wife is agreeable to this and verbalized understanding of appt date and time.

## 2017-10-06 ENCOUNTER — Encounter: Payer: Self-pay | Admitting: Neurology

## 2017-10-06 ENCOUNTER — Ambulatory Visit (INDEPENDENT_AMBULATORY_CARE_PROVIDER_SITE_OTHER): Payer: Medicare Other | Admitting: Neurology

## 2017-10-06 VITALS — BP 156/79 | HR 75 | Ht 66.0 in | Wt 223.0 lb

## 2017-10-06 DIAGNOSIS — G4733 Obstructive sleep apnea (adult) (pediatric): Secondary | ICD-10-CM

## 2017-10-06 DIAGNOSIS — F40240 Claustrophobia: Secondary | ICD-10-CM | POA: Diagnosis not present

## 2017-10-06 NOTE — Progress Notes (Signed)
Subjective:    Patient ID: [name] is a 77 y.o. male.  HPI     Interim history:   Mr. Rochin is a 77 year old right-handed gentleman with an underlying medical history of hypertension, CAD with s/p MI and stent placement, s/p multiple surgeries, including tonsillectomy as a child, cholecystectomy, low back surgery, right total hip replacement surgery, bilateral rotator cuff surgeries bilateral knee replacement surgeries, hyperlipidemia, diabetes, migraine headaches (seen by Dr. Jannifer Franklin in the past), BPH, allergies and obesity, who presents for follow-up consultation of his obstructive sleep apnea, after recent sleep study testing. The patient is accompanied by his wife today. I first met him on 09/05/2017 at the request of his primary care physician, at which time the patient reported snoring and daytime somnolence. He was advised to proceed with a sleep study. He had a baseline sleep study on 09/18/2017. I went over his test results with him in detail today. Sleep efficiency was 80%, REM latency markedly delayed at 381 minutes but sleep latency was normal. Wake after sleep onset was 71 minutes. He had an increased percentage of stage II sleep, REM sleep markedly reduced at 6.5%. Total AHI was in the moderate range at 26.6 per hour, average oxygen saturation 92%, nadir was 86%. He had severe PLMS with mild arousals. Based on his medical history and test results I suggested we proceed with CPAP therapy and proceed with a sleep study for titration. He declined a CPAP titration study reporting that he was claustrophobic. He was offered a follow-up appointment.  Today: 10/06/2017: He reports that he does not want a full facemask. He would not be able to tolerated. He is claustrophobic. We talked about the details of the sleep study. We talked about the importance of treating moderate sleep apnea especially in light of history of heart disease, hypertension and hyperlipidemia. He would be willing  to try nasal pillows. I suggested we bring him in for a daytime desensitization appointment in the sleep lab to work with the sleep tech or sleep lab manager, one on one.   Previously:   09/05/2017: (He) reports snoring and excessive daytime somnolence. His Epworth sleepiness score is 7 out of 24, fatigue score is 24 out of 63. He is retired, lives with his wife, they have 1 child. He quit smoking in 1990 and drinks alcohol infrequently and does not utilize caffeine on a regular or daily basis. I reviewed your office note from 07/30/2011. He falls asleep during the day, typically in his recliner. His bedtime is around 10 and rise time around 8. He reports lack of energy and lack of motivation to do things that he previously enjoyed such as hunting. He is not aware of any family history of OSA. He denies morning headaches but has significant nocturia about 3 times per night on average.   The patient's allergies, current medications, family history, past medical history, past social history, past surgical history and problem list were reviewed and updated as appropriate.    His Past Medical History Is Significant For: Past Medical History:  Diagnosis Date  . Anxiety disorder   . Arthritis   . Chronic headaches   . Coronary atherosclerosis of native coronary artery    BMS nondominant RCA 12/2004  . Essential hypertension, benign   . GERD (gastroesophageal reflux disease)   . History of adenomatous polyp of colon   . History of kidney stones   . History of MI (myocardial infarction) 12/2004   s/p  BMS to RCA  .  Hyperlipidemia   . Internal hemorrhoids   . Nocturia more than twice per night   . Numbness of left foot   . S/p bare metal coronary artery stent 12/2004   BMS x1 to RCA  . Spinal stenosis   . Type 2 diabetes mellitus treated with insulin ([place])   . Urgency of urination   . Wears dentures    upper    His Past Surgical History Is Significant For: Past Surgical History:   Procedure Laterality Date  . CARDIAC CATHETERIZATION  06-14-2004  dr Lia Foyer   total occlusion mD1, RI 30-40%, mRCA nondominant hazy 80% and scattered 30-40%,  ef 71% (medical mangement)  . CARDIOVASCULAR STRESS TEST  09-17-2015   dr Domenic Polite   Low risk nuclear study w/ reversible mild small anteroapical defect,  normal LV function and wall motion, nuclear stress ef 61%  . CATARACT EXTRACTION W/ INTRAOCULAR LENS IMPLANT Right 07/2014  . COLONOSCOPY  09/10/2008   normal  . CORONARY ANGIOPLASTY WITH STENT PLACEMENT  12-17-2004   dr benismhon/ dr Olevia Perches   nonobstructive cad LAD and LCFx,  BMS x1 to total occlusion RCA   . CYSTOSCOPY W/ URETEROSCOPY W/ LITHOTRIPSY  08/2010  . ERCP  03/14/2008  . LAPAROSCOPIC CHOLECYSTECTOMY  05/2012  . LEFT HEART CATHETERIZATION WITH CORONARY ANGIOGRAM N/A 05/14/2011   Procedure: LEFT HEART CATHETERIZATION WITH CORONARY ANGIOGRAM;  Surgeon: Sherren Mocha, MD;  Location: [facility] CATH LAB;  Service: Cardiovascular;  Laterality: N/A;    non-obstructive LM, LAD, LCFx, patent RCA stent  . LUMBAR LAMINECTOMY/DECOMPRESSION MICRODISCECTOMY N/A 02/24/2017   Procedure: Microlumbar decompression L4-5, L5-S1;  Surgeon: Susa Day, MD;  Location: [facility] ORS;  Service: Orthopedics;  Laterality: N/A;  120 mins  . ROTATOR CUFF REPAIR Right 03/2002  . ROTATOR CUFF REPAIR Left 12/11/2015  . TOTAL HIP ARTHROPLASTY Right 12/20/2008  . TOTAL KNEE ARTHROPLASTY Bilateral left 12-11-2003/  right 07-31-2004   dr Wynelle [facility]  . UPPER GASTROINTESTINAL ENDOSCOPY  01/08/2008   bx, inlet patch, duodenitis  . YAG LASER APPLICATION Right 4/81/8563   Procedure: YAG LASER APPLICATION;  Surgeon: Williams Che, MD;  Location: [facility] ORS;  Service: Ophthalmology;  Laterality: Right;    His Family History Is Significant For: Family History  Problem Relation Age of Onset  . Colon cancer Mother        Diagnosed age 46  . Cancer Mother 87  . Heart disease Father   . Heart attack Father   . Breast  cancer Sister   . Heart disease Brother   . Brain cancer Sister   . Heart disease Brother   . Bladder Cancer Brother   . Cancer - Other Brother     His Social History Is Significant For: Social History   Socioeconomic History  . Marital status: Married    Spouse name: Not on file  . Number of children: 0  . Years of education: HS  . Highest education level: High school graduate  Occupational History  . Occupation: RETIRED    Comment: Power plant  Social Needs  . Financial resource strain: Not hard at all  . Food insecurity:    Worry: Never true    Inability: Never true  . Transportation needs:    Medical: No    Non-medical: No  Tobacco Use  . Smoking status: Former Smoker    Packs/day: 1.00    Years: 20.00    Pack years: 20.00    Types: Cigarettes    Start date: 01/18/1963  Last attempt to quit: 08/11/1988    Years since quitting: 29.1  . Smokeless tobacco: Former Systems developer    Types: Chew    Quit date: 01/11/1989  . Tobacco comment: chewed 1 pack tobacco/day for 15 years  Substance and Sexual Activity  . Alcohol use: No    Alcohol/week: 0.0 standard drinks    Frequency: Never  . Drug use: No  . Sexual activity: Yes  Lifestyle  . Physical activity:    Days per week: Not on file    Minutes per session: Not on file  . Stress: Not at all  Relationships  . Social connections:    Talks on phone: More than three times a week    Gets together: More than three times a week    Attends religious service: Never    Active member of club or organization: No    Attends meetings of clubs or organizations: Never    Relationship status: Married  Other Topics Concern  . Not on file  Social History Narrative   No regular exercise      Daily caffeine use: 3 cups   Patient is right handed.       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 Ace Inhibitors Hives, Swelling and Rash     Rash,hives,tongue swelling  . Angiotensin Receptor Blockers     Unknown reaction   . Ciprofloxacin Other (See Comments)    confusion  . Other Hives and Other (See Comments)    Altaseptic, altase (cough)  . Oxycodone-Acetaminophen Other (See Comments)    REACTION: unknown reaction  . Ramipril Cough  . Robaxin [Methocarbamol] Other (See Comments)    Confusion   . Toradol [Ketorolac Tromethamine] Other (See Comments)    confusion  . Valium Other (See Comments)    Hallucinations; confusion  . Doxycycline Hives and Rash  :   His Current Medications Are:  Outpatient Encounter Medications as of 10/06/2017  Medication Sig  . acetaminophen (TYLENOL) 650 MG CR tablet Take 1,300 mg by mouth 2 (two) times daily as needed for pain.   Marland Kitchen amLODipine (NORVASC) 5 MG tablet TAKE 1 TABLET BY MOUTH  DAILY  . aspirin 81 MG tablet Take 1 tablet (81 mg total) by mouth every evening. Resume 4 days post-op  . aspirin-acetaminophen-caffeine (EXCEDRIN EXTRA STRENGTH) 250-250-65 MG tablet Take 1 tablet by mouth daily as needed (headaches). May resume 5 days post-op as needed  . atorvastatin (LIPITOR) 20 MG tablet TAKE 1 TABLET BY MOUTH  DAILY AT 6 PM.  . baclofen (LIORESAL) 10 MG tablet Take 1 tablet (10 mg total) by mouth at bedtime as needed for muscle spasms.  . butalbital-acetaminophen-caffeine (FIORICET, ESGIC) 50-325-40 MG tablet Take 1 tablet by mouth 2 (two) times daily as needed for headache.  . cetirizine (ZYRTEC) 10 MG tablet Take 10 mg by mouth every evening.   . Cholecalciferol (VITAMIN D3) 2000 UNITS TABS Take 2,000 Units by mouth every morning.   . docusate sodium (COLACE) 100 MG capsule Take 1 capsule (100 mg total) by mouth 2 (two) times daily.  . famotidine (PEPCID) 20 MG tablet TAKE 1 TABLET BY MOUTH TWO  TIMES DAILY  . HUMALOG KWIKPEN 100 UNIT/ML KiwkPen INJECT 0 TO 15 UNITS  SUBCUTANEOUSLY 3 TIMES  DAILY BEFORE MEALS  (70-150=8 UNITS, 151-200=9  UNITS) (Patient taking differently: inject  10-12 units before each meal)  . HYDROcodone-acetaminophen (NORCO) 10-325  MG tablet Take 1 tablet by mouth every 4 (four) hours as needed for severe pain.  . Insulin Pen Needle (B-D ULTRAFINE III SHORT PEN) 31G X 8 MM MISC USE 4 TIMES DAILY DX E11.49  . LANTUS SOLOSTAR 100 UNIT/ML Solostar Pen INJECT SUBCUTANEOUSLY 52  UNITS AT BEDTIME  . losartan (COZAAR) 50 MG tablet TAKE 1 TABLET BY MOUTH  DAILY  . Menthol, Topical Analgesic, (BLUE-EMU MAXIMUM STRENGTH EX) Apply 1 application topically daily as needed (muscle pain).  . metFORMIN (GLUCOPHAGE-XR) 500 MG 24 hr tablet TAKE 2 TABLETS BY MOUTH TWO TIMES DAILY  . nitroGLYCERIN (NITROSTAT) 0.4 MG SL tablet DISSOLVE ONE TABLET UNDER THE TONGUE EVERY 5 MINUTES AS NEEDED FOR CHEST PAIN.  DO NOT EXCEED A TOTAL OF 3 DOSES IN 15 MINUTES  . ONE TOUCH ULTRA TEST test strip USE 1 STRIP TO CHECK GLUCOSE 4 TIMES DAILY  . PARoxetine (PAXIL) 20 MG tablet TAKE 1 TABLET BY MOUTH  DAILY  . polyethylene glycol (MIRALAX / GLYCOLAX) packet Take 17 g by mouth daily.  . tamsulosin (FLOMAX) 0.4 MG CAPS capsule TAKE 1 CAPSULE BY MOUTH  DAILY  . [DISCONTINUED] chlorthalidone (HYGROTON) 25 MG tablet TAKE 1 TABLET BY MOUTH  DAILY AS DIRECTED   No facility-administered encounter medications on file as of 10/06/2017.   :  Review of Systems:  Out of a complete 14 point review of systems, all are reviewed and negative with the exception of these symptoms as listed below:  Review of Systems  Neurological:       Pt presents today to discuss his sleep study results. Pt is claustrophobic and does not believe that he can tolerate a cpap.    Objective:  Neurological Exam  Physical Exam Physical Examination:   Vitals:   10/06/17 1354  BP: (!) 156/79  Pulse: 75    General Examination: The patient is a very pleasant 77 y.o. male in no acute distress. He appears well-developed and well-nourished and well groomed.   HEENT: Normocephalic, atraumatic, pupils are equal, round  and reactive to light and accommodation. Extraocular tracking is good without limitation to gaze excursion or nystagmus noted. Normal smooth pursuit is noted. Hearing is grossly intact, perhaps mildly impaired. Face is symmetric with normal facial animation and normal facial sensation. Speech is clear with no dysarthria noted. There is no hypophonia. Oropharynx exam reveals: mild mouth dryness, adequate dental hygiene, with full dentures on top and permanent bridge in the front on the bottom. He has moderate airway crowding.  Chest: Clear to auscultation without wheezing, rhonchi or crackles noted.  Heart: S1+S2+0, regular and normal without murmurs, rubs or gallops noted.   Abdomen: Soft, non-tender and non-distended with normal bowel sounds appreciated on auscultation.  Extremities: There is no pitting edema in the distal lower extremities bilaterally.   Skin: Warm and dry without trophic changes noted.  Musculoskeletal: exam reveals no obvious joint deformities, tenderness or joint swelling or erythema. He is status post right total hip replacement, he is status post bilateral knee replacement surgeries with unremarkable scars noted.  Neurologically:  Mental status: The patient is awake, alert and oriented in all 4 spheres. His immediate and remote memory, attention, language skills and fund of knowledge are appropriate. There is no evidence of aphasia, agnosia, apraxia or anomia. Speech is clear with normal prosody and enunciation. Thought process is linear. Mood is normal and affect is normal.  Cranial nerves II - XII are as described above under HEENT exam.  Motor exam: Normal  bulk, strength and tone is noted. There is no tremor or rebound. Fine motor skills and coordination: grossly intact.  Cerebellar testing: No dysmetria or intention tremor on finger to nose testing. Heel to shin is unremarkable bilaterally. There is no truncal or gait ataxia.  Sensory exam: intact to light touch  in the upper and lower extremities.  Gait, station and balance: He stands easily. No veering to one side is noted. No leaning to one side is noted. Posture is age-appropriate and stance is narrow based. Gait shows normal stride length and normal pace. No problems turning are noted.              Assessment and Plan:  In summary, QUAMIR WILLEMSEN is a very pleasant 77 year old male with an underlying medical history of hypertension, CAD with s/p MI and stent placement, s/p multiple surgeries, including tonsillectomy as a child, cholecystectomy, low back surgery, right total hip replacement surgery, bilateral rotator cuff surgeries bilateral knee replacement surgeries, hyperlipidemia, diabetes, migraine headaches, BPH, allergies and obesity, who presents for follow-up consultation of his obstructive sleep apnea. His baseline sleep study recently showed evidence of moderate obstructive sleep apnea. We talked at length about the test results and we also talked about treatment options. His best treatment option would be in the form of positive airway pressure. We talked about the risks and ramifications of untreated sleep apnea especially with regards to cardiovascular complications down the road. He does have symptoms including daytime somnolence. He has significant reservations about trying CPAP especially with a full facemask and is reassured that not everybody requires a full facemask or high pressures. We will proceed with a desensitization appointment during the day to help acclimatize him to hopefully nasal interface and to get used to CPAP pressure. He would be willing to proceed with this, we will have our sleep lab staff get in touch with him to facilitate this appointment. After that I will request an overnight CPAP titration study. I answered all their questions today and the patient and his wife were in agreement. I spent 25 minutes in total face-to-face time with the patient, more than 50% of which  was spent in counseling and coordination of care, reviewing test results, reviewing medication and discussing or reviewing the diagnosis of OSA, its prognosis and treatment options. Pertinent laboratory and imaging test results that were available during this visit with the patient were reviewed by me and considered in my medical decision making (see chart for details).

## 2017-10-06 NOTE — Patient Instructions (Signed)
As discussed, we will have you come back for a brief daytime appointment in the sleep lab to work with [name], our sleep lab manager or Meagan, for a mask fitting and desensitization appointment. You may be able to tolerate a nasal mask, such as nasal pillows. Based on how this goes, I will order your second sleep study for CPAP treatment. You have moderate sleep apnea and should not remain untreated if at all possible.

## 2017-10-07 ENCOUNTER — Telehealth: Payer: Self-pay | Admitting: Cardiology

## 2017-10-07 NOTE — Telephone Encounter (Signed)
Patient called stating that he is no longer taking Chlorthalidone 25 mg.

## 2017-10-10 NOTE — Telephone Encounter (Signed)
Patient returned call to [facility] from last week.

## 2017-10-10 NOTE — Telephone Encounter (Signed)
Pt says pcp told him to stop Chlorthalidone at last visit and wanted to make Dr Domenic Polite aware - says it was stopped for low BP

## 2017-10-12 DIAGNOSIS — M25551 Pain in right hip: Secondary | ICD-10-CM | POA: Diagnosis not present

## 2017-10-12 DIAGNOSIS — M545 Low back pain: Secondary | ICD-10-CM | POA: Diagnosis not present

## 2017-10-12 DIAGNOSIS — M7061 Trochanteric bursitis, right hip: Secondary | ICD-10-CM | POA: Diagnosis not present

## 2017-10-12 DIAGNOSIS — M25559 Pain in unspecified hip: Secondary | ICD-10-CM | POA: Diagnosis not present

## 2017-10-12 DIAGNOSIS — Z96649 Presence of unspecified artificial hip joint: Secondary | ICD-10-CM | POA: Insufficient documentation

## 2017-10-12 DIAGNOSIS — M7060 Trochanteric bursitis, unspecified hip: Secondary | ICD-10-CM | POA: Insufficient documentation

## 2017-10-17 ENCOUNTER — Telehealth: Payer: Self-pay

## 2017-10-17 NOTE — Telephone Encounter (Signed)
Patient came to sleep lab for desensitizing to CPAP. He is skeptical of wearing a mask and using cpap. I explained the importance of wearing cpap/ Fitted him with a res med P10 medium and he did well but mouth breathes. He could not tolerate this one. I fitted him next with a Dream wear full face medium mask. He did great and had no leaks with a pressure of 10. I had him lie down and wear for a while. He feels he can adjust to this mask. No anxiety noted. I scheduled him to come back for cpap titration. He is ready to give this a try.

## 2017-10-18 DIAGNOSIS — M5417 Radiculopathy, lumbosacral region: Secondary | ICD-10-CM | POA: Diagnosis not present

## 2017-10-18 DIAGNOSIS — M7071 Other bursitis of hip, right hip: Secondary | ICD-10-CM | POA: Diagnosis not present

## 2017-10-18 DIAGNOSIS — M545 Low back pain: Secondary | ICD-10-CM | POA: Diagnosis not present

## 2017-10-25 ENCOUNTER — Ambulatory Visit (INDEPENDENT_AMBULATORY_CARE_PROVIDER_SITE_OTHER): Payer: Medicare Other | Admitting: Neurology

## 2017-10-25 DIAGNOSIS — R351 Nocturia: Secondary | ICD-10-CM

## 2017-10-25 DIAGNOSIS — G4720 Circadian rhythm sleep disorder, unspecified type: Secondary | ICD-10-CM

## 2017-10-25 DIAGNOSIS — G4719 Other hypersomnia: Secondary | ICD-10-CM

## 2017-10-25 DIAGNOSIS — G4733 Obstructive sleep apnea (adult) (pediatric): Secondary | ICD-10-CM | POA: Diagnosis not present

## 2017-10-25 DIAGNOSIS — E669 Obesity, unspecified: Secondary | ICD-10-CM

## 2017-10-25 DIAGNOSIS — G4761 Periodic limb movement disorder: Secondary | ICD-10-CM

## 2017-10-31 ENCOUNTER — Ambulatory Visit (INDEPENDENT_AMBULATORY_CARE_PROVIDER_SITE_OTHER): Payer: Medicare Other | Admitting: Family Medicine

## 2017-10-31 ENCOUNTER — Encounter: Payer: Self-pay | Admitting: Family Medicine

## 2017-10-31 VITALS — BP 157/68 | HR 65 | Temp 97.0°F | Ht 66.0 in | Wt 221.0 lb

## 2017-10-31 DIAGNOSIS — E1159 Type 2 diabetes mellitus with other circulatory complications: Secondary | ICD-10-CM | POA: Diagnosis not present

## 2017-10-31 DIAGNOSIS — Z23 Encounter for immunization: Secondary | ICD-10-CM | POA: Diagnosis not present

## 2017-10-31 DIAGNOSIS — I152 Hypertension secondary to endocrine disorders: Secondary | ICD-10-CM

## 2017-10-31 DIAGNOSIS — E785 Hyperlipidemia, unspecified: Secondary | ICD-10-CM

## 2017-10-31 DIAGNOSIS — E1169 Type 2 diabetes mellitus with other specified complication: Secondary | ICD-10-CM | POA: Diagnosis not present

## 2017-10-31 DIAGNOSIS — Z794 Long term (current) use of insulin: Secondary | ICD-10-CM

## 2017-10-31 DIAGNOSIS — K219 Gastro-esophageal reflux disease without esophagitis: Secondary | ICD-10-CM

## 2017-10-31 DIAGNOSIS — E669 Obesity, unspecified: Secondary | ICD-10-CM

## 2017-10-31 DIAGNOSIS — E1129 Type 2 diabetes mellitus with other diabetic kidney complication: Secondary | ICD-10-CM

## 2017-10-31 DIAGNOSIS — I10 Essential (primary) hypertension: Secondary | ICD-10-CM

## 2017-10-31 DIAGNOSIS — R809 Proteinuria, unspecified: Secondary | ICD-10-CM

## 2017-10-31 LAB — BAYER DCA HB A1C WAIVED: HB A1C (BAYER DCA - WAIVED): 7.2 % — ABNORMAL HIGH (ref <7.0)

## 2017-10-31 MED ORDER — METFORMIN HCL ER 500 MG PO TB24: ORAL_TABLET | ORAL | 3 refills | Status: DC

## 2017-10-31 MED ORDER — TAMSULOSIN HCL 0.4 MG PO CAPS: 0.4000 mg | ORAL_CAPSULE | Freq: Every day | ORAL | 3 refills | Status: DC

## 2017-10-31 MED ORDER — PAROXETINE HCL 20 MG PO TABS: 20.0000 mg | ORAL_TABLET | Freq: Every day | ORAL | 3 refills | Status: DC

## 2017-10-31 MED ORDER — FAMOTIDINE 20 MG PO TABS: 20.0000 mg | ORAL_TABLET | Freq: Two times a day (BID) | ORAL | 3 refills | Status: DC

## 2017-10-31 MED ORDER — AMLODIPINE BESYLATE 5 MG PO TABS: 5.0000 mg | ORAL_TABLET | Freq: Every day | ORAL | 3 refills | Status: DC

## 2017-10-31 MED ORDER — ATORVASTATIN CALCIUM 20 MG PO TABS: ORAL_TABLET | ORAL | 3 refills | Status: DC

## 2017-10-31 NOTE — Progress Notes (Signed)
BP (!) 157/68   Pulse 65   Temp (!) 97 F (36.1 C) (Oral)   Ht _0  (1.676 m)   Wt 221 lb (100.2 kg)   BMI 35.67 kg/m    Subjective:    Patient ID: [name], male    DOB: 03-Jan-1941, 77 y.o.   MRN: [id number]  HPI: [name] is a 77 y.o. male presenting on 10/31/2017 for Diabetes (3 month follow up) and Hypertension   HPI Type 2 diabetes mellitus Patient comes in today for recheck of his diabetes. Patient has been currently taking metformin and Humalog and Lantus, takes 52 units of Lantus and 10 3 times daily of Humalog. Patient is currently on an ACE inhibitor/ARB. Patient has not seen an ophthalmologist this year. Patient denies any issues with their feet.   Hyperlipidemia Patient is coming in for recheck of his hyperlipidemia. The patient is currently taking atorvastatin. They deny any issues with myalgias or history of liver damage from it. They deny any focal numbness or weakness or chest pain.   Hypertension Patient is currently on losartan and amlodipine, and their blood pressure today is 157/68 the patient admits that he had not taken his medication this morning. Patient denies any lightheadedness or dizziness. Patient denies headaches, blurred vision, chest pains, shortness of breath, or weakness. Denies any side effects from medication and is content with current medication.   GERD Patient is currently on famotidine.  She denies any major symptoms or abdominal pain or belching or burping. She denies any blood in her stool or lightheadedness or dizziness.   Relevant past medical, surgical, family and social history reviewed and updated as indicated. Interim medical history since our last visit reviewed. Allergies and medications reviewed and updated.  Review of Systems  Constitutional: Negative for chills and fever.  Eyes: Negative for visual disturbance.  Respiratory: Negative for shortness of breath and wheezing.   Cardiovascular: Negative for  chest pain and leg swelling.  Gastrointestinal: Positive for abdominal distention. Negative for abdominal pain.  Musculoskeletal: Negative for back pain and gait problem.  Skin: Negative for rash.  Neurological: Negative for dizziness, weakness, light-headedness and numbness.  All other systems reviewed and are negative.   Per HPI unless specifically indicated above   Allergies as of 10/31/2017      Reactions   Ace Inhibitors Hives, Swelling, Rash   Rash,hives,tongue swelling   Angiotensin Receptor Blockers    Unknown reaction    Ciprofloxacin Other (See Comments)   confusion   Other Hives, Other (See Comments)   Altaseptic, altase (cough)   Oxycodone-acetaminophen Other (See Comments)   REACTION: unknown reaction   Ramipril Cough   Robaxin [methocarbamol] Other (See Comments)   Confusion    Toradol [ketorolac Tromethamine] Other (See Comments)   confusion   Valium Other (See Comments)   Hallucinations; confusion   Doxycycline Hives, Rash      Medication List        Accurate as of 10/31/17  8:52 AM. Always use your most recent med list.          acetaminophen 650 MG CR tablet Commonly known as:  TYLENOL Take 1,300 mg by mouth 2 (two) times daily as needed for pain.   amLODipine 5 MG tablet Commonly known as:  NORVASC Take 1 tablet (5 mg total) by mouth daily.   aspirin 81 MG tablet Take 1 tablet (81 mg total) by mouth every evening. Resume 4 days post-op   aspirin-acetaminophen-caffeine 250-250-65 MG  tablet Commonly known as:  EXCEDRIN MIGRAINE Take 1 tablet by mouth daily as needed (headaches). May resume 5 days post-op as needed   atorvastatin 20 MG tablet Commonly known as:  LIPITOR TAKE 1 TABLET BY MOUTH  DAILY AT 6 PM.   baclofen 10 MG tablet Commonly known as:  LIORESAL Take 1 tablet (10 mg total) by mouth at bedtime as needed for muscle spasms.   BLUE-EMU MAXIMUM STRENGTH EX Apply 1 application topically daily as needed (muscle pain).     butalbital-acetaminophen-caffeine 50-325-40 MG tablet Commonly known as:  FIORICET, ESGIC Take 1 tablet by mouth 2 (two) times daily as needed for headache.   cetirizine 10 MG tablet Commonly known as:  ZYRTEC Take 10 mg by mouth every evening.   docusate sodium 100 MG capsule Commonly known as:  COLACE Take 1 capsule (100 mg total) by mouth 2 (two) times daily.   famotidine 20 MG tablet Commonly known as:  PEPCID Take 1 tablet (20 mg total) by mouth 2 (two) times daily.   HUMALOG KWIKPEN 100 UNIT/ML KiwkPen Generic drug:  insulin lispro INJECT 0 TO 15 UNITS  SUBCUTANEOUSLY 3 TIMES  DAILY BEFORE MEALS  (70-150=8 UNITS, 151-200=9  UNITS)   HYDROcodone-acetaminophen 10-325 MG tablet Commonly known as:  NORCO Take 1 tablet by mouth every 4 (four) hours as needed for severe pain.   Insulin Pen Needle 31G X 8 MM Misc USE 4 TIMES DAILY DX E11.49   LANTUS SOLOSTAR 100 UNIT/ML Solostar Pen Generic drug:  Insulin Glargine INJECT SUBCUTANEOUSLY 52  UNITS AT BEDTIME   losartan 50 MG tablet Commonly known as:  COZAAR TAKE 1 TABLET BY MOUTH  DAILY   metFORMIN 500 MG 24 hr tablet Commonly known as:  GLUCOPHAGE-XR TAKE 2 TABLETS BY MOUTH TWO TIMES DAILY   nitroGLYCERIN 0.4 MG SL tablet Commonly known as:  NITROSTAT DISSOLVE ONE TABLET UNDER THE TONGUE EVERY 5 MINUTES AS NEEDED FOR CHEST PAIN.  DO NOT EXCEED A TOTAL OF 3 DOSES IN 15 MINUTES   ONE TOUCH ULTRA TEST test strip Generic drug:  glucose blood USE 1 STRIP TO CHECK GLUCOSE 4 TIMES DAILY   PARoxetine 20 MG tablet Commonly known as:  PAXIL Take 1 tablet (20 mg total) by mouth daily.   polyethylene glycol packet Commonly known as:  MIRALAX / GLYCOLAX Take 17 g by mouth daily.   tamsulosin 0.4 MG Caps capsule Commonly known as:  FLOMAX Take 1 capsule (0.4 mg total) by mouth daily.   Vitamin D3 2000 units Tabs Take 2,000 Units by mouth every morning.          Objective:    BP (!) 157/68   Pulse 65   Temp (!)  97 F (36.1 C) (Oral)   Ht _0  (1.676 m)   Wt 221 lb (100.2 kg)   BMI 35.67 kg/m   Wt Readings from Last 3 Encounters:  10/31/17 221 lb (100.2 kg)  10/06/17 223 lb (101.2 kg)  09/16/17 223 lb 6.4 oz (101.3 kg)    Physical Exam  Constitutional: He is oriented to person, place, and time. He appears well-developed and well-nourished. No distress.  Eyes: Pupils are equal, round, and reactive to light. Conjunctivae and EOM are normal. Right eye exhibits no discharge. No scleral icterus.  Neck: Neck supple. No thyromegaly present.  Cardiovascular: Normal rate, regular rhythm, normal heart sounds and intact distal pulses.  No murmur heard. Pulmonary/Chest: Effort normal and breath sounds normal. No respiratory distress. He has no  wheezes.  Musculoskeletal: Normal range of motion. He exhibits no edema.  Lymphadenopathy:    He has no cervical adenopathy.  Neurological: He is alert and oriented to person, place, and time. Coordination normal.  Skin: Skin is warm and dry. No rash noted. He is not diaphoretic.  Psychiatric: He has a normal mood and affect. His behavior is normal.  Nursing note and vitals reviewed.   Results for orders placed or performed in visit on 07/29/17  Bayer DCA Hb A1c Waived  Result Value Ref Range   HB A1C (BAYER DCA - WAIVED) 7.2 (H) <7.0 %  CBC with Differential/Platelet  Result Value Ref Range   WBC 8.0 3.4 - 10.8 x10E3/uL   RBC 5.14 4.14 - 5.80 x10E6/uL   Hemoglobin 14.6 13.0 - 17.7 g/dL   Hematocrit 43.8 37.5 - 51.0 %   MCV 85 79 - 97 fL   MCH 28.4 26.6 - 33.0 pg   MCHC 33.3 31.5 - 35.7 g/dL   RDW 14.6 12.3 - 15.4 %   Platelets 276 150 - 450 x10E3/uL   Neutrophils 59 Not Estab. %   Lymphs 28 Not Estab. %   Monocytes 9 Not Estab. %   Eos 3 Not Estab. %   Basos 1 Not Estab. %   Neutrophils Absolute 4.7 1.4 - 7.0 x10E3/uL   Lymphocytes Absolute 2.2 0.7 - 3.1 x10E3/uL   Monocytes Absolute 0.8 0.1 - 0.9 x10E3/uL   EOS (ABSOLUTE) 0.2 0.0 - 0.4 x10E3/uL    Basophils Absolute 0.1 0.0 - 0.2 x10E3/uL   Immature Granulocytes 0 Not Estab. %   Immature Grans (Abs) 0.0 0.0 - 0.1 x10E3/uL  CMP14+EGFR  Result Value Ref Range   Glucose 144 (H) 65 - 99 mg/dL   BUN 18 8 - 27 mg/dL   Creatinine, Ser 1.07 0.76 - 1.27 mg/dL   GFR calc non Af Amer 67 >59 mL/min/1.73   GFR calc Af Amer 77 >59 mL/min/1.73   BUN/Creatinine Ratio 17 10 - 24   Sodium 140 134 - 144 mmol/L   Potassium 4.5 3.5 - 5.2 mmol/L   Chloride 99 96 - 106 mmol/L   CO2 26 20 - 29 mmol/L   Calcium 9.5 8.6 - 10.2 mg/dL   Total Protein 7.0 6.0 - 8.5 g/dL   Albumin 3.9 3.5 - 4.8 g/dL   Globulin, Total 3.1 1.5 - 4.5 g/dL   Albumin/Globulin Ratio 1.3 1.2 - 2.2   Bilirubin Total 0.5 0.0 - 1.2 mg/dL   Alkaline Phosphatase 73 39 - 117 IU/L   AST 13 0 - 40 IU/L   ALT 17 0 - 44 IU/L  PSA, total and free  Result Value Ref Range   Prostate Specific Ag, Serum 1.3 0.0 - 4.0 ng/mL   PSA, Free 0.38 N/A ng/mL   PSA, Free Pct 29.2 %      Assessment & Plan:   Problem List Items Addressed This Visit      Cardiovascular and Mediastinum   Hypertension associated with diabetes (HCC)   Relevant Medications   amLODipine (NORVASC) 5 MG tablet   atorvastatin (LIPITOR) 20 MG tablet   metFORMIN (GLUCOPHAGE-XR) 500 MG 24 hr tablet   Type 2 diabetes mellitus with circulatory disorder (HCC) - Primary   Relevant Medications   amLODipine (NORVASC) 5 MG tablet   atorvastatin (LIPITOR) 20 MG tablet   metFORMIN (GLUCOPHAGE-XR) 500 MG 24 hr tablet   Other Relevant Orders   Bayer DCA Hb A1c Waived     Digestive  GERD (gastroesophageal reflux disease)   Relevant Medications   famotidine (PEPCID) 20 MG tablet     Endocrine   Hyperlipidemia associated with type 2 diabetes mellitus (HCC)   Relevant Medications   atorvastatin (LIPITOR) 20 MG tablet   metFORMIN (GLUCOPHAGE-XR) 500 MG 24 hr tablet   Microalbuminuria due to type 2 diabetes mellitus (HCC)   Relevant Medications   atorvastatin (LIPITOR)  20 MG tablet   metFORMIN (GLUCOPHAGE-XR) 500 MG 24 hr tablet     Other   Obesity (BMI 30-39.9)   Relevant Medications   metFORMIN (GLUCOPHAGE-XR) 500 MG 24 hr tablet     Continue metformin and Lantus and Humalog, may need to increase Lantus but we will see where his A1c is today.  We discussed going to see a diabetic educator and patient was resistant today.  He said his blood sugar is 170 this a.m. and he just got off steroids 1 week ago.  Continue current blood pressure medications, elevated because he has not taken it yet today and we will monitor, recommended for him to take medications before he comes  Follow up plan: Return in about 3 months (around 01/31/2018), or if symptoms worsen or fail to improve, for Type 2 diabetes and hypertension cholesterol.  Counseling provided for all of the vaccine components Orders Placed This Encounter  Procedures  . Bayer [facility] Hb A1c Waived    Caryl Pina, MD [facility] 10/31/2017, 8:52 AM

## 2017-11-07 NOTE — Procedures (Signed)
PATIENT'S NAME:  [name], [name] DOB:      04-Jul-1940      MR#:    [id number]     DATE OF RECORDING: 10/25/2017 REFERRING M.D.:  Caryl Pina, MD Study Performed:   CPAP  Titration HISTORY: 77 year old man with a history of hypertension, CAD with s/p MI and stent placement, s/p multiple surgeries, hyperlipidemia, diabetes, migraines, BPH, allergies and obesity, who returns for a CPAP titration  sleep study. His baseline PSG on 09/18/17 showed an AHI of 26.6/hour, Supine AHI of 79.0/hour, and an oxygen saturation nadir of 86%. The patient endorsed the Epworth Sleepiness Scale at 7/24 points. BMI of 35.4 kg/m2.   CURRENT MEDICATIONS: Tylenol, Norvasc, Aspirin, Excedrin, Lipitor, [place], Fioricet, Zyrtec, Hygroton, Vitamin D3, Colace, Pepcid, Norco, Lantus, Cozaar, Glucophage, Nitrostat, Paxil, Miralax, Flomax,   PROCEDURE:  This is a multichannel digital polysomnogram utilizing the SomnoStar 11.2 system.  Electrodes and sensors were applied and monitored per AASM Specifications.   EEG, EOG, Chin and Limb EMG, were sampled at 200 Hz.  ECG, Snore and Nasal Pressure, Thermal Airflow, Respiratory Effort, CPAP Flow and Pressure, Oximetry was sampled at 50 Hz. Digital video and audio were recorded.      The patient was fitted with a medium Dreamwear FFM. CPAP was initiated at 5 cmH20 with heated humidity per AASM standards and pressure was advanced to 6 cmH20 because of hypopneas, apneas and desaturations. At a PAP pressure of 6 cmH20, there was a reduction of the AHI to 0/hour with non-supine NREM sleep achieved and O2 nadir of 92%.  Lights Out was at 21:38 and Lights On at 05:06. Total recording time (TRT) was 448.5 minutes, with a total sleep time (TST) of 253.5 minutes. The patient's sleep latency was 66.5 minutes, which is delayed. REM sleep was absent. The sleep efficiency was 56.5 %.    SLEEP ARCHITECTURE: WASO (Wake after sleep onset) was 150 minutes with several longer periods of wakefulness.   There were 10.5 minutes in Stage N1, 227.5 minutes Stage N2, 15.5 minutes Stage N3 and 0 minutes in Stage REM.  The percentage of Stage N1 was 4.1%, Stage N2 was 89.7%, which is markedly increased, Stage N3 was 6.1% and Stage R (REM sleep) was absent. The arousals were noted as: 14 were spontaneous, 6 were associated with PLMs, 0 were associated with respiratory events.  RESPIRATORY ANALYSIS:  There was a total of 0 respiratory events: 0 obstructive apneas, 0 central apneas and 0 mixed apneas with a total of 0 apneas and an apnea index (AI) of 0 /hour. There were 0 hypopneas with a hypopnea index of 0/hour. The patient also had 0 respiratory event related arousals (RERAs).      The total APNEA/HYPOPNEA INDEX  (AHI) was 0 /hour and the total RESPIRATORY DISTURBANCE INDEX was 0 /hour  0 events occurred in REM sleep and 0 events in NREM. The REM AHI was 0 /hour versus a non-REM AHI of 0 /hour.  The patient spent 18 minutes of total sleep time in the supine position and 236 minutes in non-supine. The supine AHI was 0.0, versus a non-supine AHI of 0.0.  OXYGEN SATURATION & C02:  The baseline 02 saturation was 95%, with the lowest being 84%. Time spent below 89% saturation equaled 0 minutes.  PERIODIC LIMB MOVEMENTS:  The patient had a total of 11 Periodic Limb Movements. The Periodic Limb Movement (PLM) index was 2.6 and the PLM Arousal index was 1.4 /hour.  Audio and video analysis did not  show any abnormal or unusual movements, behaviors, phonations or vocalizations. The patient took 4 bathroom breaks. The EKG was in keeping with normal sinus rhythm.  Post-study, the patient indicated that sleep was worse than usual.   IMPRESSION:  1. Obstructive Sleep Apnea (OSA) 2. Dysfunctions associated with sleep stages or arousal from sleep   RECOMMENDATIONS: 1. This study demonstrates near-resolution of the patient's obstructive sleep apnea with CPAP therapy. Given, that no REM sleep was achieved and  non-supine sleep on the final titration pressure of 6 cm, I will recommend a home CPAP treatment at a pressure of 8 cm via FFM with heated humidity. The patient should be reminded to be fully compliant with PAP therapy to improve sleep related symptoms and decrease long term cardiovascular risks. The patient should be reminded, that it may take up to 3 months to get fully used to using PAP with all planned sleep. The earlier full compliance is achieved, the better long term compliance tends to be. Please note that untreated obstructive sleep apnea may carry additional perioperative morbidity. Patients with significant obstructive sleep apnea should receive perioperative PAP therapy and the surgeons and particularly the anesthesiologist should be informed of the diagnosis and the severity of the sleep disordered breathing. 2. This study shows significant sleep fragmentation and abnormal sleep stage percentages; these are nonspecific findings and per se do not signify an intrinsic sleep disorder or a cause for the patient's sleep-related symptoms. Causes include (but are not limited to) the first night effect of the sleep study, circadian rhythm disturbances, medication effect or an underlying mood disorder or medical problem. 3. The patient should be cautioned not to drive, work at heights, or operate dangerous or heavy equipment when tired or sleepy. Review and reiteration of good sleep hygiene measures should be pursued with any patient. 4. The patient will be seen in follow-up in the sleep clinic at [facility] for discussion of the test results, symptom and treatment compliance review, further management strategies, etc. The referring provider will be notified of the test results.   I certify that I have reviewed the entire raw data recording prior to the issuance of this report in accordance with the Standards of Accreditation of the American Academy of Sleep Medicine (AASM)   Star Age, MD, PhD Diplomat,  American Board of Neurology and Sleep Medicine (Neurology and Sleep Medicine)

## 2017-11-07 NOTE — Addendum Note (Signed)
Addended by: Star Age on: 11/07/2017 05:16 PM   Modules accepted: Orders

## 2017-11-07 NOTE — Progress Notes (Signed)
Patient referred by Dr. Warrick Parisian, last seen by me on 10/06/17, diagnostic PSG on 09/18/17, CPAP titration study on 10/25/17.   Please call and inform patient that I have entered an order for treatment with positive airway pressure (PAP) treatment for obstructive sleep apnea (OSA). He did okay during the latest sleep study with CPAP. We will, therefore, arrange for a machine for home use through a DME (durable medical equipment) company of His choice; and I will see the patient back in follow-up in about 10 weeks. Please also explain to the patient that I will be looking out for compliance data, which can be downloaded from the machine (stored on an SD card, that is inserted in the machine) or via remote access through a modem, that is built into the machine. At the time of the followup appointment we will discuss sleep study results and how it is going with PAP treatment at home. Please advise patient to bring his machine at the time of the first FU visit, even though this is cumbersome. Bringing the machine for every visit after that will likely not be needed, but often helps for the first visit to troubleshoot if needed. Please re-enforce the importance of compliance with treatment and the need for [place] to monitor compliance data - often an insurance requirement and actually good feedback for the patient as far as how they are doing.  Also remind patient, that any interim PAP machine or mask issues should be first addressed with the DME company, as they can often help better with technical and mask fit issues. Please ask if patient has a preference regarding DME company.  Please also make sure, the patient has a follow-up appointment with me in about 10 weeks from the setup date, thanks. May see one of our nurse practitioners if needed for proper timing of the FU appointment.  Please fax or rout report to the referring provider. Thanks,   Star Age, MD, PhD [facility])

## 2017-11-08 ENCOUNTER — Telehealth: Payer: Self-pay

## 2017-11-08 NOTE — Telephone Encounter (Signed)
-----   Message from [name] Age, MD sent at 11/07/2017  5:15 PM EDT ----- Patient referred by Dr. Warrick Parisian, last seen by me on 10/06/17, diagnostic PSG on 09/18/17, CPAP titration study on 10/25/17.   Please call and inform patient that I have entered an order for treatment with positive airway pressure (PAP) treatment for obstructive sleep apnea (OSA). He did okay during the latest sleep study with CPAP. We will, therefore, arrange for a machine for home use through a DME (durable medical equipment) company of His choice; and I will see the patient back in follow-up in about 10 weeks. Please also explain to the patient that I will be looking out for compliance data, which can be downloaded from the machine (stored on an SD card, that is inserted in the machine) or via remote access through a modem, that is built into the machine. At the time of the followup appointment we will discuss sleep study results and how it is going with PAP treatment at home. Please advise patient to bring his machine at the time of the first FU visit, even though this is cumbersome. Bringing the machine for every visit after that will likely not be needed, but often helps for the first visit to troubleshoot if needed. Please re-enforce the importance of compliance with treatment and the need for [place] to monitor compliance data - often an insurance requirement and actually good feedback for the patient as far as how they are doing.  Also remind patient, that any interim PAP machine or mask issues should be first addressed with the DME company, as they can often help better with technical and mask fit issues. Please ask if patient has a preference regarding DME company.  Please also make sure, the patient has a follow-up appointment with me in about 10 weeks from the setup date, thanks. May see one of our nurse practitioners if needed for proper timing of the FU appointment.  Please fax or rout report to the referring provider. Thanks,    [name] Age, MD, PhD [facility])

## 2017-11-08 NOTE — Telephone Encounter (Signed)
I called pt. I advised pt that Dr. Rexene Alberts reviewed their sleep study results and found that pt did ok with the cpap during his latest sleep study. Dr. Rexene Alberts recommends that pt start a cpap at home. I reviewed PAP compliance expectations with the pt. Pt is agreeable to starting a CPAP. I advised pt that an order will be sent to a DME, [facility], and [facility] will call the pt within about one week after they file with the pt's insurance. [facility] will show the pt how to use the machine, fit for masks, and troubleshoot the CPAP if needed. A follow up appt was made for insurance purposes with Dr. Rexene Alberts on 01/24/18 at 9:30am. Pt verbalized understanding to arrive 15 minutes early and bring their CPAP. A letter with all of this information in it will be mailed to the pt as a reminder. I verified with the pt that the address we have on file is correct. Pt verbalized understanding of results. Pt had no questions at this time but was encouraged to call back if questions arise. I have sent the order to [facility] and have received confirmation that they have received the order.

## 2017-11-10 ENCOUNTER — Ambulatory Visit: Payer: Self-pay | Admitting: Neurology

## 2017-11-21 DIAGNOSIS — G44009 Cluster headache syndrome, unspecified, not intractable: Secondary | ICD-10-CM | POA: Diagnosis not present

## 2017-11-21 DIAGNOSIS — G4733 Obstructive sleep apnea (adult) (pediatric): Secondary | ICD-10-CM | POA: Diagnosis not present

## 2017-11-30 ENCOUNTER — Other Ambulatory Visit: Payer: Self-pay | Admitting: Family Medicine

## 2017-11-30 DIAGNOSIS — M25552 Pain in left hip: Secondary | ICD-10-CM | POA: Diagnosis not present

## 2017-12-14 ENCOUNTER — Observation Stay (HOSPITAL_BASED_OUTPATIENT_CLINIC_OR_DEPARTMENT_OTHER)
Admission: EM | Admit: 2017-12-14 | Discharge: 2017-12-15 | Disposition: A | Discharge status: Home / Self Care | Payer: Medicare Other | Source: Home / Self Care | Attending: Emergency Medicine | Admitting: Emergency Medicine

## 2017-12-14 ENCOUNTER — Emergency Department (HOSPITAL_COMMUNITY): Payer: Medicare Other

## 2017-12-14 ENCOUNTER — Observation Stay (HOSPITAL_COMMUNITY): Payer: Medicare Other

## 2017-12-14 DIAGNOSIS — I252 Old myocardial infarction: Secondary | ICD-10-CM | POA: Diagnosis not present

## 2017-12-14 DIAGNOSIS — E1159 Type 2 diabetes mellitus with other circulatory complications: Secondary | ICD-10-CM

## 2017-12-14 DIAGNOSIS — J9601 Acute respiratory failure with hypoxia: Secondary | ICD-10-CM | POA: Diagnosis not present

## 2017-12-14 DIAGNOSIS — Z87891 Personal history of nicotine dependence: Secondary | ICD-10-CM

## 2017-12-14 DIAGNOSIS — G4733 Obstructive sleep apnea (adult) (pediatric): Secondary | ICD-10-CM | POA: Diagnosis not present

## 2017-12-14 DIAGNOSIS — Z96641 Presence of right artificial hip joint: Secondary | ICD-10-CM | POA: Insufficient documentation

## 2017-12-14 DIAGNOSIS — Z86010 Personal history of colonic polyps: Secondary | ICD-10-CM | POA: Diagnosis not present

## 2017-12-14 DIAGNOSIS — R51 Headache: Secondary | ICD-10-CM

## 2017-12-14 DIAGNOSIS — M7070 Other bursitis of hip, unspecified hip: Secondary | ICD-10-CM | POA: Diagnosis not present

## 2017-12-14 DIAGNOSIS — Z87442 Personal history of urinary calculi: Secondary | ICD-10-CM | POA: Diagnosis not present

## 2017-12-14 DIAGNOSIS — Z79899 Other long term (current) drug therapy: Secondary | ICD-10-CM

## 2017-12-14 DIAGNOSIS — I2510 Atherosclerotic heart disease of native coronary artery without angina pectoris: Secondary | ICD-10-CM | POA: Insufficient documentation

## 2017-12-14 DIAGNOSIS — K219 Gastro-esophageal reflux disease without esophagitis: Secondary | ICD-10-CM | POA: Diagnosis not present

## 2017-12-14 DIAGNOSIS — Z961 Presence of intraocular lens: Secondary | ICD-10-CM | POA: Diagnosis not present

## 2017-12-14 DIAGNOSIS — R4182 Altered mental status, unspecified: Secondary | ICD-10-CM

## 2017-12-14 DIAGNOSIS — G459 Transient cerebral ischemic attack, unspecified: Secondary | ICD-10-CM | POA: Insufficient documentation

## 2017-12-14 DIAGNOSIS — R404 Transient alteration of awareness: Secondary | ICD-10-CM | POA: Diagnosis not present

## 2017-12-14 DIAGNOSIS — Z800 Family history of malignant neoplasm of digestive organs: Secondary | ICD-10-CM | POA: Diagnosis not present

## 2017-12-14 DIAGNOSIS — G8929 Other chronic pain: Secondary | ICD-10-CM

## 2017-12-14 DIAGNOSIS — G9340 Encephalopathy, unspecified: Secondary | ICD-10-CM | POA: Diagnosis not present

## 2017-12-14 DIAGNOSIS — Z9841 Cataract extraction status, right eye: Secondary | ICD-10-CM | POA: Diagnosis not present

## 2017-12-14 DIAGNOSIS — Z743 Need for continuous supervision: Secondary | ICD-10-CM | POA: Diagnosis not present

## 2017-12-14 DIAGNOSIS — Z7982 Long term (current) use of aspirin: Secondary | ICD-10-CM | POA: Insufficient documentation

## 2017-12-14 DIAGNOSIS — R519 Headache, unspecified: Secondary | ICD-10-CM

## 2017-12-14 DIAGNOSIS — Z955 Presence of coronary angioplasty implant and graft: Secondary | ICD-10-CM

## 2017-12-14 DIAGNOSIS — R509 Fever, unspecified: Secondary | ICD-10-CM | POA: Diagnosis not present

## 2017-12-14 DIAGNOSIS — R4781 Slurred speech: Secondary | ICD-10-CM | POA: Diagnosis not present

## 2017-12-14 DIAGNOSIS — R410 Disorientation, unspecified: Secondary | ICD-10-CM | POA: Diagnosis not present

## 2017-12-14 DIAGNOSIS — I10 Essential (primary) hypertension: Secondary | ICD-10-CM | POA: Diagnosis not present

## 2017-12-14 DIAGNOSIS — Z794 Long term (current) use of insulin: Secondary | ICD-10-CM | POA: Insufficient documentation

## 2017-12-14 DIAGNOSIS — I959 Hypotension, unspecified: Secondary | ICD-10-CM | POA: Diagnosis not present

## 2017-12-14 DIAGNOSIS — R4789 Other speech disturbances: Secondary | ICD-10-CM | POA: Diagnosis not present

## 2017-12-14 DIAGNOSIS — R4701 Aphasia: Secondary | ICD-10-CM | POA: Diagnosis not present

## 2017-12-14 DIAGNOSIS — J439 Emphysema, unspecified: Secondary | ICD-10-CM | POA: Diagnosis not present

## 2017-12-14 DIAGNOSIS — I6523 Occlusion and stenosis of bilateral carotid arteries: Secondary | ICD-10-CM | POA: Diagnosis not present

## 2017-12-14 DIAGNOSIS — Z8673 Personal history of transient ischemic attack (TIA), and cerebral infarction without residual deficits: Secondary | ICD-10-CM | POA: Diagnosis not present

## 2017-12-14 DIAGNOSIS — J90 Pleural effusion, not elsewhere classified: Secondary | ICD-10-CM | POA: Diagnosis not present

## 2017-12-14 DIAGNOSIS — R29810 Facial weakness: Secondary | ICD-10-CM | POA: Diagnosis not present

## 2017-12-14 DIAGNOSIS — G9341 Metabolic encephalopathy: Secondary | ICD-10-CM | POA: Diagnosis not present

## 2017-12-14 DIAGNOSIS — E785 Hyperlipidemia, unspecified: Secondary | ICD-10-CM | POA: Diagnosis not present

## 2017-12-14 DIAGNOSIS — R42 Dizziness and giddiness: Secondary | ICD-10-CM | POA: Diagnosis not present

## 2017-12-14 DIAGNOSIS — Z96653 Presence of artificial knee joint, bilateral: Secondary | ICD-10-CM | POA: Insufficient documentation

## 2017-12-14 LAB — COMPREHENSIVE METABOLIC PANEL
ALT: 19 U/L (ref 0–44)
AST: 16 U/L (ref 15–41)
Albumin: 3.8 g/dL (ref 3.5–5.0)
Alkaline Phosphatase: 65 U/L (ref 38–126)
Anion gap: 8 (ref 5–15)
BUN: 17 mg/dL (ref 8–23)
CO2: 24 mmol/L (ref 22–32)
Calcium: 9 mg/dL (ref 8.9–10.3)
Chloride: 104 mmol/L (ref 98–111)
Creatinine, Ser: 0.92 mg/dL (ref 0.61–1.24)
GFR calc Af Amer: >60 mL/min (ref >60)
GFR calc non Af Amer: >60 mL/min (ref >60)
Glucose, Bld: 73 mg/dL (ref 70–99)
Potassium: 3.5 mmol/L (ref 3.5–5.1)
Sodium: 136 mmol/L (ref 135–145)
Total Bilirubin: 0.4 mg/dL (ref 0.3–1.2)
Total Protein: 7.4 g/dL (ref 6.5–8.1)

## 2017-12-14 LAB — CBC
HCT: 45.1 % (ref 39.0–52.0)
Hemoglobin: 14.1 g/dL (ref 13.0–17.0)
MCH: 28.2 pg (ref 26.0–34.0)
MCHC: 31.3 g/dL (ref 30.0–36.0)
MCV: 90.2 fL (ref 80.0–100.0)
Platelets: 285 10*3/uL (ref 150–400)
RBC: 5 MIL/uL (ref 4.22–5.81)
RDW: 13.7 % (ref 11.5–15.5)
WBC: 7.8 10*3/uL (ref 4.0–10.5)
nRBC: 0 % (ref 0.0–0.2)

## 2017-12-14 LAB — I-STAT CHEM 8, ED
BUN: 17 mg/dL (ref 8–23)
Calcium, Ion: 1.13 mmol/L — ABNORMAL LOW (ref 1.15–1.40)
Chloride: 103 mmol/L (ref 98–111)
Creatinine, Ser: 0.9 mg/dL (ref 0.61–1.24)
Glucose, Bld: 68 mg/dL — ABNORMAL LOW (ref 70–99)
HCT: 43 % (ref 39.0–52.0)
Hemoglobin: 14.6 g/dL (ref 13.0–17.0)
Potassium: 3.6 mmol/L (ref 3.5–5.1)
Sodium: 138 mmol/L (ref 135–145)
TCO2: 25 mmol/L (ref 22–32)

## 2017-12-14 LAB — CBG MONITORING, ED
Glucose-Capillary: 62 mg/dL — ABNORMAL LOW (ref 70–99)
Glucose-Capillary: 125 mg/dL — ABNORMAL HIGH (ref 70–99)

## 2017-12-14 LAB — DIFFERENTIAL
Abs Immature Granulocytes: 0.02 10*3/uL (ref 0.00–0.07)
Basophils Absolute: 0.1 10*3/uL (ref 0.0–0.1)
Basophils Relative: 1 %
Eosinophils Absolute: 0.2 10*3/uL (ref 0.0–0.5)
Eosinophils Relative: 2 %
Immature Granulocytes: 0 %
Lymphocytes Relative: 31 %
Lymphs Abs: 2.4 10*3/uL (ref 0.7–4.0)
Monocytes Absolute: 0.7 10*3/uL (ref 0.1–1.0)
Monocytes Relative: 9 %
Neutro Abs: 4.4 10*3/uL (ref 1.7–7.7)
Neutrophils Relative %: 57 %

## 2017-12-14 LAB — URINALYSIS, ROUTINE W REFLEX MICROSCOPIC
Bacteria, UA: NONE SEEN
Bilirubin Urine: NEGATIVE
Glucose, UA: 150 mg/dL — AB
Hgb urine dipstick: NEGATIVE
Ketones, ur: NEGATIVE mg/dL
Leukocytes, UA: NEGATIVE
Nitrite: NEGATIVE
Protein, ur: 30 mg/dL — AB
Specific Gravity, Urine: 1.04 — ABNORMAL HIGH (ref 1.005–1.030)
pH: 5 (ref 5.0–8.0)

## 2017-12-14 LAB — RAPID URINE DRUG SCREEN, HOSP PERFORMED
Amphetamines: NOT DETECTED
Barbiturates: NOT DETECTED
Benzodiazepines: NOT DETECTED
Cocaine: NOT DETECTED
Opiates: NOT DETECTED
Tetrahydrocannabinol: NOT DETECTED

## 2017-12-14 LAB — I-STAT TROPONIN, ED: Troponin i, poc: 0 ng/mL (ref 0.00–0.08)

## 2017-12-14 LAB — PROTIME-INR
INR: 0.94
Prothrombin Time: 12.5 seconds (ref 11.4–15.2)

## 2017-12-14 LAB — ETHANOL: Alcohol, Ethyl (B): <10 mg/dL (ref <10)

## 2017-12-14 LAB — APTT: aPTT: 27 seconds (ref 24–36)

## 2017-12-14 MED ORDER — ACETAMINOPHEN 650 MG RE SUPP: 650.0000 mg | Freq: Four times a day (QID) | RECTAL | Status: DC | PRN

## 2017-12-14 MED ORDER — BUTALBITAL-APAP-CAFFEINE 50-325-40 MG PO TABS: 1.0000 | ORAL_TABLET | Freq: Once | ORAL | Status: DC

## 2017-12-14 MED ORDER — LOSARTAN POTASSIUM 50 MG PO TABS
50.0000 mg | ORAL_TABLET | Freq: Every morning | ORAL | Status: DC
  Administered 2017-12-15: 50 mg via ORAL
  Filled 2017-12-14: qty 1

## 2017-12-14 MED ORDER — DEXTROSE 50 % IV SOLN
25.0000 g | Freq: Once | INTRAVENOUS | Status: AC
  Administered 2017-12-14: 50 mL via INTRAVENOUS

## 2017-12-14 MED ORDER — TAMSULOSIN HCL 0.4 MG PO CAPS: 0.4000 mg | ORAL_CAPSULE | Freq: Every evening | ORAL | Status: DC

## 2017-12-14 MED ORDER — INSULIN ASPART 100 UNIT/ML ~~LOC~~ SOLN: 0.0000 [IU] | Freq: Every day | SUBCUTANEOUS | Status: DC

## 2017-12-14 MED ORDER — MORPHINE SULFATE (PF) 2 MG/ML IV SOLN
INTRAVENOUS | Status: AC
  Administered 2017-12-14: 2 mg
  Filled 2017-12-14: qty 1

## 2017-12-14 MED ORDER — MORPHINE SULFATE (PF) 2 MG/ML IV SOLN
2.0000 mg | Freq: Once | INTRAVENOUS | Status: AC
  Administered 2017-12-14: 2 mg

## 2017-12-14 MED ORDER — INSULIN ASPART 100 UNIT/ML ~~LOC~~ SOLN
0.0000 [IU] | Freq: Three times a day (TID) | SUBCUTANEOUS | Status: DC
  Administered 2017-12-15: 8 [IU] via SUBCUTANEOUS

## 2017-12-14 MED ORDER — BUTALBITAL-APAP-CAFFEINE 50-325-40 MG PO TABS
ORAL_TABLET | ORAL | Status: AC
  Filled 2017-12-14: qty 1

## 2017-12-14 MED ORDER — PAROXETINE HCL 20 MG PO TABS
20.0000 mg | ORAL_TABLET | Freq: Every evening | ORAL | Status: DC
  Filled 2017-12-14: qty 1

## 2017-12-14 MED ORDER — ONDANSETRON HCL 4 MG PO TABS: 4.0000 mg | ORAL_TABLET | Freq: Four times a day (QID) | ORAL | Status: DC | PRN

## 2017-12-14 MED ORDER — AMLODIPINE BESYLATE 5 MG PO TABS
5.0000 mg | ORAL_TABLET | Freq: Every day | ORAL | Status: DC
  Administered 2017-12-15: 5 mg via ORAL
  Filled 2017-12-14: qty 1

## 2017-12-14 MED ORDER — IOPAMIDOL (ISOVUE-370) INJECTION 76%
150.0000 mL | Freq: Once | INTRAVENOUS | Status: AC | PRN
  Administered 2017-12-14: 115 mL via INTRAVENOUS

## 2017-12-14 MED ORDER — ACETAMINOPHEN 325 MG PO TABS
650.0000 mg | ORAL_TABLET | Freq: Four times a day (QID) | ORAL | Status: DC | PRN
  Administered 2017-12-15: 325 mg via ORAL
  Filled 2017-12-14: qty 2

## 2017-12-14 MED ORDER — PROMETHAZINE HCL 25 MG/ML IJ SOLN
INTRAMUSCULAR | Status: AC
  Administered 2017-12-14: 25 mg via INTRAVENOUS
  Filled 2017-12-14: qty 1

## 2017-12-14 MED ORDER — DEXTROSE 50 % IV SOLN
INTRAVENOUS | Status: AC
  Administered 2017-12-14: 50 mL via INTRAVENOUS
  Filled 2017-12-14: qty 50

## 2017-12-14 MED ORDER — BUTALBITAL-APAP-CAFFEINE 50-325-40 MG PO TABS
2.0000 | ORAL_TABLET | Freq: Once | ORAL | Status: AC
  Administered 2017-12-14: 2 via ORAL

## 2017-12-14 MED ORDER — ONDANSETRON HCL 4 MG/2ML IJ SOLN: 4.0000 mg | Freq: Four times a day (QID) | INTRAMUSCULAR | Status: DC | PRN

## 2017-12-14 MED ORDER — POLYETHYLENE GLYCOL 3350 17 G PO PACK: 17.0000 g | PACK | Freq: Every day | ORAL | Status: DC | PRN

## 2017-12-14 MED ORDER — ATORVASTATIN CALCIUM 20 MG PO TABS: 20.0000 mg | ORAL_TABLET | Freq: Every evening | ORAL | Status: DC

## 2017-12-14 MED ORDER — ONDANSETRON HCL 4 MG/2ML IJ SOLN
4.0000 mg | Freq: Once | INTRAMUSCULAR | Status: AC
  Administered 2017-12-14: 4 mg via INTRAVENOUS
  Filled 2017-12-14: qty 2

## 2017-12-14 MED ORDER — DOCUSATE SODIUM 100 MG PO CAPS
100.0000 mg | ORAL_CAPSULE | Freq: Two times a day (BID) | ORAL | Status: DC
  Administered 2017-12-14 – 2017-12-15 (×2): 100 mg via ORAL
  Filled 2017-12-14 (×2): qty 1

## 2017-12-14 MED ORDER — HEPARIN SODIUM (PORCINE) 5000 UNIT/ML IJ SOLN
5000.0000 [IU] | Freq: Three times a day (TID) | INTRAMUSCULAR | Status: DC
  Administered 2017-12-14 – 2017-12-15 (×3): 5000 [IU] via SUBCUTANEOUS
  Filled 2017-12-14 (×3): qty 1

## 2017-12-14 MED ORDER — LORATADINE 10 MG PO TABS
10.0000 mg | ORAL_TABLET | Freq: Every day | ORAL | Status: DC
  Administered 2017-12-15: 10 mg via ORAL
  Filled 2017-12-14: qty 1

## 2017-12-14 MED ORDER — ASPIRIN 81 MG PO CHEW
324.0000 mg | CHEWABLE_TABLET | Freq: Once | ORAL | Status: AC
  Administered 2017-12-14: 324 mg via ORAL
  Filled 2017-12-14: qty 4

## 2017-12-14 MED ORDER — HYDRALAZINE HCL 20 MG/ML IJ SOLN: 5.0000 mg | INTRAMUSCULAR | Status: DC | PRN

## 2017-12-14 MED ORDER — HYDRALAZINE HCL 20 MG/ML IJ SOLN: 10.0000 mg | INTRAMUSCULAR | Status: DC | PRN

## 2017-12-14 MED ORDER — PROMETHAZINE HCL 25 MG/ML IJ SOLN
12.5000 mg | Freq: Once | INTRAMUSCULAR | Status: AC
  Administered 2017-12-14: 25 mg via INTRAVENOUS

## 2017-12-14 NOTE — ED Notes (Signed)
[place] paged @ [phone]

## 2017-12-14 NOTE — ED Provider Notes (Signed)
[facility] EMERGENCY DEPARTMENT Provider Note   CSN: [id number] Arrival date & time: 12/14/17  1845   An emergency department physician performed an initial assessment on this suspected stroke patient at [facility].  History   Chief Complaint Chief Complaint  Patient presents with  . Code Stroke    HPI Daijon Wenke Baris is a 77 y.o. male.  HPI   77 year old male brought in by EMS as a code stroke.  Last seen normal around 6 PM.  He was at dinner with his family when he suddenly seemed confused.  Seemed like he was trying to speak but could not get out what he wanted to say.  He was acting oddly.  He took his drinking glass and placed on his dinner plate.  Questionable facial droop.  By the time he had arrived to the emergency room his symptoms had markedly improved.  He is complaining of a frontal HA.  Wife reports that he had a similar episode with his speech last night which lasted about 30 minutes and then resolved.  At that time he also seemed like he was having some word finding difficulty.  He has a past history of recurrent headaches.  His wife states that he can sometimes becomes belligerent when he is having a severe headache but does not get other neurologic symptoms with them.  Past Medical History:  Diagnosis Date  . Anxiety disorder   . Arthritis   . Chronic headaches   . Coronary atherosclerosis of native coronary artery    BMS nondominant RCA 12/2004  . Essential hypertension, benign   . GERD (gastroesophageal reflux disease)   . History of adenomatous polyp of colon   . History of kidney stones   . History of MI (myocardial infarction) 12/2004   s/p  BMS to RCA  . Hyperlipidemia   . Internal hemorrhoids   . Nocturia more than twice per night   . Numbness of left foot   . S/p bare metal coronary artery stent 12/2004   BMS x1 to RCA  . Spinal stenosis   . Type 2 diabetes mellitus treated with insulin ([place])   . Urgency of urination   . Wears dentures    upper     Patient Active Problem List   Diagnosis Date Noted  . BPH (benign prostatic hyperplasia) 07/29/2017  . Lumbar spinal stenosis 02/25/2017  . Spinal stenosis of lumbar region 02/24/2017  . Spinal stenosis at L4-L5 level 02/24/2017  . Lumbar radicular pain 01/17/2017  . Lumbar pain 01/17/2017  . Microalbuminuria due to type 2 diabetes mellitus ([place]) 07/23/2016  . Common migraine 12/23/2014  . Hx of adenomatous colonic polyps 05/09/2014  . Obesity (BMI 30-39.9) 07/30/2013  . Type 2 diabetes mellitus with circulatory disorder ([place]) 05/27/2011  . Hyperlipidemia associated with type 2 diabetes mellitus ([place]) 10/19/2008  . Hypertension associated with diabetes ([place]) 10/19/2008  . CORONARY ATHEROSCLEROSIS NATIVE CORONARY ARTERY 10/17/2008  . GERD (gastroesophageal reflux disease) 12/27/2007    Past Surgical History:  Procedure Laterality Date  . CARDIAC CATHETERIZATION  06-14-2004  dr Lia Foyer   total occlusion mD1, RI 30-40%, mRCA nondominant hazy 80% and scattered 30-40%,  ef 71% (medical mangement)  . CARDIOVASCULAR STRESS TEST  09-17-2015   dr Domenic Polite   Low risk nuclear study w/ reversible mild small anteroapical defect,  normal LV function and wall motion, nuclear stress ef 61%  . CATARACT EXTRACTION W/ INTRAOCULAR LENS IMPLANT Right 07/2014  . COLONOSCOPY  09/10/2008   normal  .  CORONARY ANGIOPLASTY WITH STENT PLACEMENT  12-17-2004   dr benismhon/ dr Olevia Perches   nonobstructive cad LAD and LCFx,  BMS x1 to total occlusion RCA   . CYSTOSCOPY W/ URETEROSCOPY W/ LITHOTRIPSY  08/2010  . ERCP  03/14/2008  . LAPAROSCOPIC CHOLECYSTECTOMY  05/2012  . LEFT HEART CATHETERIZATION WITH CORONARY ANGIOGRAM N/A 05/14/2011   Procedure: LEFT HEART CATHETERIZATION WITH CORONARY ANGIOGRAM;  Surgeon: Sherren Mocha, MD;  Location: [facility] CATH LAB;  Service: Cardiovascular;  Laterality: N/A;    non-obstructive LM, LAD, LCFx, patent RCA stent  . LUMBAR LAMINECTOMY/DECOMPRESSION MICRODISCECTOMY N/A 02/24/2017    Procedure: Microlumbar decompression L4-5, L5-S1;  Surgeon: Susa Day, MD;  Location: [facility] ORS;  Service: Orthopedics;  Laterality: N/A;  120 mins  . ROTATOR CUFF REPAIR Right 03/2002  . ROTATOR CUFF REPAIR Left 12/11/2015  . TOTAL HIP ARTHROPLASTY Right 12/20/2008  . TOTAL KNEE ARTHROPLASTY Bilateral left 12-11-2003/  right 07-31-2004   dr Wynelle Link [facility]  . UPPER GASTROINTESTINAL ENDOSCOPY  01/08/2008   bx, inlet patch, duodenitis  . YAG LASER APPLICATION Right 8/41/6606   Procedure: YAG LASER APPLICATION;  Surgeon: Williams Che, MD;  Location: [facility] ORS;  Service: Ophthalmology;  Laterality: Right;        Home Medications    Prior to Admission medications   Medication Sig Start Date End Date Taking? Authorizing Provider  acetaminophen (TYLENOL) 650 MG CR tablet Take 1,300 mg by mouth 2 (two) times daily as needed for pain.     [provider]  amLODipine (NORVASC) 5 MG tablet TAKE 1 TABLET BY MOUTH  DAILY 11/30/17   Dettinger, Fransisca Kaufmann, MD  aspirin 81 MG tablet Take 1 tablet (81 mg total) by mouth every evening. Resume 4 days post-op 02/25/17   Cecilie Kicks, PA-C  aspirin-acetaminophen-caffeine (EXCEDRIN EXTRA STRENGTH) [phone] MG tablet Take 1 tablet by mouth daily as needed (headaches). May resume 5 days post-op as needed 02/25/17   Cecilie Kicks, PA-C  atorvastatin (LIPITOR) 20 MG tablet TAKE 1 TABLET BY MOUTH  DAILY AT 6 PM. 11/30/17   Dettinger, Fransisca Kaufmann, MD  baclofen (LIORESAL) 10 MG tablet Take 1 tablet (10 mg total) by mouth at bedtime as needed for muscle spasms. 06/27/17   Dettinger, Fransisca Kaufmann, MD  butalbital-acetaminophen-caffeine (FIORICET, ESGIC) [phone] MG tablet Take 1 tablet by mouth 2 (two) times daily as needed for headache. 01/17/17   Terald Sleeper, PA-C  cetirizine (ZYRTEC) 10 MG tablet Take 10 mg by mouth every evening.     [provider]  Cholecalciferol (VITAMIN D3) 2000 UNITS TABS Take 2,000 Units by mouth every morning.     [provider]  docusate sodium (COLACE) 100 MG capsule Take 1 capsule (100 mg total) by mouth 2 (two) times daily. 02/24/17 02/24/18  Susa Day, MD  famotidine (PEPCID) 20 MG tablet TAKE 1 TABLET BY MOUTH TWO  TIMES DAILY 11/30/17   Dettinger, Fransisca Kaufmann, MD  HUMALOG KWIKPEN 100 UNIT/ML KwikPen INJECT 0 TO 15 UNITS  SUBCUTANEOUSLY 3 TIMES  DAILY BEFORE MEALS  (70-150=8 UNITS, 151-200=9  UNITS) 11/30/17   Dettinger, Fransisca Kaufmann, MD  HYDROcodone-acetaminophen (NORCO) 10-325 MG tablet Take 1 tablet by mouth every 4 (four) hours as needed for severe pain. 02/24/17   Susa Day, MD  Insulin Pen Needle (B-D ULTRAFINE III SHORT PEN) 31G X 8 MM MISC USE 4 TIMES DAILY DX E11.49 11/18/14   Wardell Honour, MD  LANTUS SOLOSTAR 100 UNIT/ML Solostar Pen INJECT SUBCUTANEOUSLY 52  UNITS  AT BEDTIME 07/11/17   Dettinger, Fransisca Kaufmann, MD  losartan (COZAAR) 50 MG tablet TAKE 1 TABLET BY MOUTH  DAILY 11/30/17   Dettinger, Fransisca Kaufmann, MD  Menthol, Topical Analgesic, (BLUE-EMU MAXIMUM STRENGTH EX) Apply 1 application topically daily as needed (muscle pain).    [provider]  metFORMIN (GLUCOPHAGE-XR) 500 MG 24 hr tablet TAKE 2 TABLETS BY MOUTH TWO TIMES DAILY 11/30/17   Dettinger, Fransisca Kaufmann, MD  nitroGLYCERIN (NITROSTAT) 0.4 MG SL tablet DISSOLVE ONE TABLET UNDER THE TONGUE EVERY 5 MINUTES AS NEEDED FOR CHEST PAIN.  DO NOT EXCEED A TOTAL OF 3 DOSES IN 15 MINUTES 02/03/17   Satira Sark, MD  ONE TOUCH ULTRA TEST test strip USE 1 STRIP TO CHECK GLUCOSE 4 TIMES DAILY 09/05/17   Dettinger, Fransisca Kaufmann, MD  PARoxetine (PAXIL) 20 MG tablet TAKE 1 TABLET BY MOUTH  DAILY 11/30/17   Dettinger, Fransisca Kaufmann, MD  polyethylene glycol (MIRALAX / GLYCOLAX) packet Take 17 g by mouth daily. 02/24/17   Susa Day, MD  tamsulosin (FLOMAX) 0.4 MG CAPS capsule TAKE 1 CAPSULE BY MOUTH  DAILY 11/30/17   Dettinger, Fransisca Kaufmann, MD    Family History Family History  Problem Relation Age of Onset  . Colon cancer Mother        Diagnosed age  61  . Cancer Mother 64  . Heart disease Father   . Heart attack Father   . Breast cancer Sister   . Heart disease Brother   . Brain cancer Sister   . Heart disease Brother   . Bladder Cancer Brother   . Cancer - Other Brother     Social History Social History   Tobacco Use  . Smoking status: Former Smoker    Packs/day: 1.00    Years: 20.00    Pack years: 20.00    Types: Cigarettes    Start date: 01/18/1963    Last attempt to quit: 08/11/1988    Years since quitting: 29.3  . Smokeless tobacco: Former Systems developer    Types: Chew    Quit date: 01/11/1989  . Tobacco comment: chewed 1 pack tobacco/day for 15 years  Substance Use Topics  . Alcohol use: No    Alcohol/week: 0.0 standard drinks    Frequency: Never  . Drug use: No     Allergies   Ace inhibitors; Angiotensin receptor blockers; Ciprofloxacin; Other; Oxycodone-acetaminophen; Ramipril; Robaxin [methocarbamol]; Toradol [ketorolac tromethamine]; Valium; and Doxycycline   Review of Systems Review of Systems  All systems reviewed and negative, other than as noted in HPI.  Physical Exam Updated Vital Signs BP (!) 176/81 (BP Location: Left Arm)   Pulse 63   Temp 98.7 F (37.1 C) (Oral)   Resp 12   SpO2 96%   Physical Exam  Constitutional: He is oriented to person, place, and time. He appears well-developed and well-nourished. No distress.  HENT:  Head: Normocephalic and atraumatic.  Eyes: Conjunctivae are normal. Right eye exhibits no discharge. Left eye exhibits no discharge.  Neck: Neck supple.  Cardiovascular: Normal rate, regular rhythm and normal heart sounds. Exam reveals no gallop and no friction rub.  No murmur heard. Pulmonary/Chest: Effort normal and breath sounds normal. No respiratory distress.  Abdominal: Soft. He exhibits no distension. There is no tenderness.  Musculoskeletal: He exhibits no edema or tenderness.  Neurological: He is alert and oriented to person, place, and time. No cranial nerve deficit.  He exhibits normal muscle tone. Coordination normal.  Skin: Skin is warm and dry.  Psychiatric: He has a normal mood and affect. His behavior is normal. Thought content normal.  Nursing note and vitals reviewed.    ED Treatments / Results  Labs (all labs ordered are listed, but only abnormal results are displayed) Labs Reviewed  URINALYSIS, ROUTINE W REFLEX MICROSCOPIC - Abnormal; Notable for the following components:      Result Value   Specific Gravity, Urine 1.040 (*)    Glucose, UA 150 (*)    Protein, ur 30 (*)    All other components within normal limits  I-STAT CHEM 8, ED - Abnormal; Notable for the following components:   Glucose, Bld 68 (*)    Calcium, Ion 1.13 (*)    All other components within normal limits  CBG MONITORING, ED - Abnormal; Notable for the following components:   Glucose-Capillary 62 (*)    All other components within normal limits  CBG MONITORING, ED - Abnormal; Notable for the following components:   Glucose-Capillary 125 (*)    All other components within normal limits  ETHANOL  PROTIME-INR  APTT  CBC  DIFFERENTIAL  COMPREHENSIVE METABOLIC PANEL  RAPID URINE DRUG SCREEN, HOSP PERFORMED  I-STAT TROPONIN, ED    EKG EKG Interpretation  Date/Time:  Wednesday December 14 2017 19:02:21 EST Ventricular Rate:  63 PR Interval:    QRS Duration: 105 QT Interval:  407 QTC Calculation: 417 R Axis:   10 Text Interpretation:  Sinus rhythm Baseline wander in lead(s) V4 Confirmed by Virgel Manifold [phone]) on 12/14/2017 9:13:37 PM   Radiology Ct Angio Head W Or Wo Contrast  Result Date: 12/14/2017 CLINICAL DATA:  77 y/o  M; right facial droop. EXAM: CT ANGIOGRAPHY HEAD AND NECK CT PERFUSION BRAIN TECHNIQUE: Multidetector CT imaging of the head and neck was performed using the standard protocol during bolus administration of intravenous contrast. Multiplanar CT image reconstructions and MIPs were obtained to evaluate the vascular anatomy. Carotid stenosis  measurements (when applicable) are obtained utilizing NASCET criteria, using the distal internal carotid diameter as the denominator. Multiphase CT imaging of the brain was performed following IV bolus contrast injection. Subsequent parametric perfusion maps were calculated using RAPID software. CONTRAST:  142mL ISOVUE-370 IOPAMIDOL (ISOVUE-370) INJECTION 76% COMPARISON:  12/14/2017 CT head. FINDINGS: CTA NECK FINDINGS Aortic arch: Standard branching. Imaged portion shows no evidence of dissection. No significant stenosis of the major arch vessel origins. 4 cm ascending aorta. Calcific atherosclerosis. Right carotid system: No evidence of dissection, stenosis (50% or greater) or occlusion. Calcific atherosclerosis of the carotid bifurcation with mild less than 50% proximal ICA stenosis. Left carotid system: No evidence of dissection, stenosis (50% or greater) or occlusion. Calcific atherosclerosis of the carotid bifurcation with mild less than 50% proximal ICA stenosis. Vertebral arteries: Left dominant. No evidence of dissection, stenosis (50% or greater) or occlusion. Skeleton: Moderate cervical spondylosis with multilevel disc and facet degenerative changes greatest at the C5-C7 levels. C4-5 grade 1 anterolisthesis. Uncovertebral and facet hypertrophy results in right-sided bony foraminal encroachment at C5-C7 and left-sided bony foraminal encroachment at C4-C7. Multifactorial moderate to severe spinal canal stenosis at C5-6. Other neck: Negative. Upper chest: Severe coronary artery calcific atherosclerosis, partially visualized. Mildly patulous thoracic esophagus. Review of the MIP images confirms the above findings CTA HEAD FINDINGS Anterior circulation: No significant stenosis, proximal occlusion, aneurysm, or vascular malformation. Non stenotic calcific atherosclerosis of the carotid siphons. Posterior circulation: No significant stenosis, proximal occlusion, aneurysm, or vascular malformation. Non stenotic  calcific atherosclerosis of the bilateral vertebral arteries. Venous sinuses: As permitted  by contrast timing, patent. Anatomic variants: Fetal right PCA. Review of the MIP images confirms the above findings CT Brain Perfusion Findings: CBF (<30%) Volume: 1mL Perfusion (Tmax>6.0s) volume: 80mL Mismatch Volume: 36mL Infarction Location:No acute stroke by perfusion criteria. Increased T-max corresponds to the left parieto-occipital junction, however, there is translational motion artifact on the CT perfusion post processing misregistration artifact. The motion is continuous throughout the scan. IMPRESSION: CTA neck: 1. Patent carotid and vertebral arteries. No dissection, aneurysm, or hemodynamically significant stenosis by NASCET criteria. 2. 4 cm ascending aortic aneurysm. Recommend annual imaging followup by CTA or MRA. This recommendation follows 2010 ACCF/AHA/AATS/ACR/ASA/SCA/SCAI/SIR/STS/SVM Guidelines for the Diagnosis and Management of Patients with Thoracic Aortic Disease. 2010; 121: K562-B638. 3. Calcific atherosclerosis of bilateral carotid bifurcations with mild less than 50% proximal ICA stenosis bilaterally. 4. Advanced cervical spondylosis with multifactorial moderate to severe C5-6 spinal canal stenosis. CTA head: Patent anterior and posterior intracranial circulation. No large vessel occlusion, aneurysm, or significant stenosis. CT brain perfusion: Motion artifact resulting post processing misregistration. Small areas of perfusion anomaly may be obscured. No gross large vascular territory acute infarct by perfusion criteria. Electronically Signed   By: Kristine Garbe M.D.   On: 12/14/2017 20:38   Ct Angio Neck W And/or Wo Contrast  Result Date: 12/14/2017 CLINICAL DATA:  77 y/o  M; right facial droop. EXAM: CT ANGIOGRAPHY HEAD AND NECK CT PERFUSION BRAIN TECHNIQUE: Multidetector CT imaging of the head and neck was performed using the standard protocol during bolus administration of  intravenous contrast. Multiplanar CT image reconstructions and MIPs were obtained to evaluate the vascular anatomy. Carotid stenosis measurements (when applicable) are obtained utilizing NASCET criteria, using the distal internal carotid diameter as the denominator. Multiphase CT imaging of the brain was performed following IV bolus contrast injection. Subsequent parametric perfusion maps were calculated using RAPID software. CONTRAST:  131mL ISOVUE-370 IOPAMIDOL (ISOVUE-370) INJECTION 76% COMPARISON:  12/14/2017 CT head. FINDINGS: CTA NECK FINDINGS Aortic arch: Standard branching. Imaged portion shows no evidence of dissection. No significant stenosis of the major arch vessel origins. 4 cm ascending aorta. Calcific atherosclerosis. Right carotid system: No evidence of dissection, stenosis (50% or greater) or occlusion. Calcific atherosclerosis of the carotid bifurcation with mild less than 50% proximal ICA stenosis. Left carotid system: No evidence of dissection, stenosis (50% or greater) or occlusion. Calcific atherosclerosis of the carotid bifurcation with mild less than 50% proximal ICA stenosis. Vertebral arteries: Left dominant. No evidence of dissection, stenosis (50% or greater) or occlusion. Skeleton: Moderate cervical spondylosis with multilevel disc and facet degenerative changes greatest at the C5-C7 levels. C4-5 grade 1 anterolisthesis. Uncovertebral and facet hypertrophy results in right-sided bony foraminal encroachment at C5-C7 and left-sided bony foraminal encroachment at C4-C7. Multifactorial moderate to severe spinal canal stenosis at C5-6. Other neck: Negative. Upper chest: Severe coronary artery calcific atherosclerosis, partially visualized. Mildly patulous thoracic esophagus. Review of the MIP images confirms the above findings CTA HEAD FINDINGS Anterior circulation: No significant stenosis, proximal occlusion, aneurysm, or vascular malformation. Non stenotic calcific atherosclerosis of the  carotid siphons. Posterior circulation: No significant stenosis, proximal occlusion, aneurysm, or vascular malformation. Non stenotic calcific atherosclerosis of the bilateral vertebral arteries. Venous sinuses: As permitted by contrast timing, patent. Anatomic variants: Fetal right PCA. Review of the MIP images confirms the above findings CT Brain Perfusion Findings: CBF (<30%) Volume: 101mL Perfusion (Tmax>6.0s) volume: 68mL Mismatch Volume: 58mL Infarction Location:No acute stroke by perfusion criteria. Increased T-max corresponds to the left parieto-occipital junction, however, there is translational motion  artifact on the CT perfusion post processing misregistration artifact. The motion is continuous throughout the scan. IMPRESSION: CTA neck: 1. Patent carotid and vertebral arteries. No dissection, aneurysm, or hemodynamically significant stenosis by NASCET criteria. 2. 4 cm ascending aortic aneurysm. Recommend annual imaging followup by CTA or MRA. This recommendation follows 2010 ACCF/AHA/AATS/ACR/ASA/SCA/SCAI/SIR/STS/SVM Guidelines for the Diagnosis and Management of Patients with Thoracic Aortic Disease. 2010; 121: T245-Y099. 3. Calcific atherosclerosis of bilateral carotid bifurcations with mild less than 50% proximal ICA stenosis bilaterally. 4. Advanced cervical spondylosis with multifactorial moderate to severe C5-6 spinal canal stenosis. CTA head: Patent anterior and posterior intracranial circulation. No large vessel occlusion, aneurysm, or significant stenosis. CT brain perfusion: Motion artifact resulting post processing misregistration. Small areas of perfusion anomaly may be obscured. No gross large vascular territory acute infarct by perfusion criteria. Electronically Signed   By: Kristine Garbe M.D.   On: 12/14/2017 20:38   Ct Cerebral Perfusion W Contrast  Result Date: 12/14/2017 CLINICAL DATA:  77 y/o  M; right facial droop. EXAM: CT ANGIOGRAPHY HEAD AND NECK CT PERFUSION BRAIN  TECHNIQUE: Multidetector CT imaging of the head and neck was performed using the standard protocol during bolus administration of intravenous contrast. Multiplanar CT image reconstructions and MIPs were obtained to evaluate the vascular anatomy. Carotid stenosis measurements (when applicable) are obtained utilizing NASCET criteria, using the distal internal carotid diameter as the denominator. Multiphase CT imaging of the brain was performed following IV bolus contrast injection. Subsequent parametric perfusion maps were calculated using RAPID software. CONTRAST:  156mL ISOVUE-370 IOPAMIDOL (ISOVUE-370) INJECTION 76% COMPARISON:  12/14/2017 CT head. FINDINGS: CTA NECK FINDINGS Aortic arch: Standard branching. Imaged portion shows no evidence of dissection. No significant stenosis of the major arch vessel origins. 4 cm ascending aorta. Calcific atherosclerosis. Right carotid system: No evidence of dissection, stenosis (50% or greater) or occlusion. Calcific atherosclerosis of the carotid bifurcation with mild less than 50% proximal ICA stenosis. Left carotid system: No evidence of dissection, stenosis (50% or greater) or occlusion. Calcific atherosclerosis of the carotid bifurcation with mild less than 50% proximal ICA stenosis. Vertebral arteries: Left dominant. No evidence of dissection, stenosis (50% or greater) or occlusion. Skeleton: Moderate cervical spondylosis with multilevel disc and facet degenerative changes greatest at the C5-C7 levels. C4-5 grade 1 anterolisthesis. Uncovertebral and facet hypertrophy results in right-sided bony foraminal encroachment at C5-C7 and left-sided bony foraminal encroachment at C4-C7. Multifactorial moderate to severe spinal canal stenosis at C5-6. Other neck: Negative. Upper chest: Severe coronary artery calcific atherosclerosis, partially visualized. Mildly patulous thoracic esophagus. Review of the MIP images confirms the above findings CTA HEAD FINDINGS Anterior  circulation: No significant stenosis, proximal occlusion, aneurysm, or vascular malformation. Non stenotic calcific atherosclerosis of the carotid siphons. Posterior circulation: No significant stenosis, proximal occlusion, aneurysm, or vascular malformation. Non stenotic calcific atherosclerosis of the bilateral vertebral arteries. Venous sinuses: As permitted by contrast timing, patent. Anatomic variants: Fetal right PCA. Review of the MIP images confirms the above findings CT Brain Perfusion Findings: CBF (<30%) Volume: 34mL Perfusion (Tmax>6.0s) volume: 30mL Mismatch Volume: 84mL Infarction Location:No acute stroke by perfusion criteria. Increased T-max corresponds to the left parieto-occipital junction, however, there is translational motion artifact on the CT perfusion post processing misregistration artifact. The motion is continuous throughout the scan. IMPRESSION: CTA neck: 1. Patent carotid and vertebral arteries. No dissection, aneurysm, or hemodynamically significant stenosis by NASCET criteria. 2. 4 cm ascending aortic aneurysm. Recommend annual imaging followup by CTA or MRA. This recommendation follows 2010 ACCF/AHA/AATS/ACR/ASA/SCA/SCAI/SIR/STS/SVM Guidelines  for the Diagnosis and Management of Patients with Thoracic Aortic Disease. 2010; 121: O277-A128. 3. Calcific atherosclerosis of bilateral carotid bifurcations with mild less than 50% proximal ICA stenosis bilaterally. 4. Advanced cervical spondylosis with multifactorial moderate to severe C5-6 spinal canal stenosis. CTA head: Patent anterior and posterior intracranial circulation. No large vessel occlusion, aneurysm, or significant stenosis. CT brain perfusion: Motion artifact resulting post processing misregistration. Small areas of perfusion anomaly may be obscured. No gross large vascular territory acute infarct by perfusion criteria. Electronically Signed   By: Kristine Garbe M.D.   On: 12/14/2017 20:38   Ct Head Code Stroke Wo  Contrast`  Result Date: 12/14/2017 CLINICAL DATA:  Code stroke. 77 y/o M; right-sided facial droop and slurred speech since 6 p.m. EXAM: CT HEAD WITHOUT CONTRAST TECHNIQUE: Contiguous axial images were obtained from the base of the skull through the vertex without intravenous contrast. COMPARISON:  12/06/2014 MRI head and CT head. FINDINGS: Brain: No evidence of acute infarction, hemorrhage, hydrocephalus, extra-axial collection or mass lesion/mass effect. Chronic microvascular ischemic changes and volume loss of the brain. Vascular: Calcific atherosclerosis of carotid siphons. No hyperdense vessel identified. Skull: Normal. Negative for fracture or focal lesion. Sinuses/Orbits: Right mastoid opacification. Additional visible paranasal sinuses and mastoid air cells are otherwise normally aerated. Right intra-ocular lens replacement Other: None. ASPECTS [facility] Stroke Program Early CT Score) - Ganglionic level infarction (caudate, lentiform nuclei, internal capsule, insula, M1-M3 cortex): 7 - Supraganglionic infarction (M4-M6 cortex): 3 Total score (0-10 with 10 being normal): 10 IMPRESSION: 1. No acute intracranial abnormality identified. 2. ASPECTS is 10. 3. Stable chronic microvascular ischemic changes and volume loss of the brain. 4. Right mastoid opacification. These results were called by telephone at the time of interpretation on 12/14/2017 at 6:58 pm to Dr. Virgel Manifold , who verbally acknowledged these results. Electronically Signed   By: Kristine Garbe M.D.   On: 12/14/2017 19:02    Procedures Procedures (including critical care time)  Medications Ordered in ED Medications  aspirin chewable tablet 324 mg (324 mg Oral Given 12/14/17 1925)     Initial Impression / Assessment and Plan / ED Course  I have reviewed the triage vital signs and the nursing notes.  Pertinent labs & imaging results that were available during my care of the patient were reviewed by me and considered in my  medical decision making (see chart for details).    77 year old male with symptoms concerning for TIA.  Wife reports speech difficulty yesterday which resolved and again today with additional symptoms of questionable facial droop and some confusion.  Symptoms now resolved but is complaining of a headache.  He has a past history of headaches but does not typically get neurological symptoms with it.  His initial work-up was unremarkable.  Given his symptoms yesterday and again today, neurology recommended CTA which is negative for explanatory pathology as well.  Will admit for further work-up.  Final Clinical Impressions(s) / ED Diagnoses   Final diagnoses:  TIA (transient ischemic attack)  Chronic nonintractable headache, unspecified headache type    ED Discharge Orders    None       Virgel Manifold, MD 12/14/17 2121

## 2017-12-14 NOTE — H&P (Signed)
History and Physical    [name] VVO:[id number] DOB: 13-Mar-1940 DOA: 12/14/2017  PCP: Dettinger, Fransisca Kaufmann, MD   Patient coming from: Home  Chief Complaint: Word finding difficulty, confusion  HPI: [name] is a 77 y.o. male with medical history significant for CAD, DM, BPH, HTN, brought to [facility] with reports of word finding difficulty and confusion.  Patient was about to have dinner at a resturant when suddenly he was confused, put his cut on his plate, and seemed like he didn't know what he was doing.  His wife asked him question he could not get his words out.  Similar occurrence yesterday evening also at another restaurant.  Pulses at bedside and helps with the history.  No fevers no chills, no shortness of breath or cough, has had headaches intermittently but none in the several months/year, no dysuria no abdominal pain.  Last bowel movement yesterday.  Before patient was about to eat blood sugar was in the 100s.  EMS reported some right facial droop, which had resolved by Ed arrival..  ED Course: Blood pressure systolic 269S to 854O otherwise stable vitals.  BMP - glucose 73, otherwise unremarkable CBC BMP.  At the time of ED providers evaluation-may be some mild dysarthria but patient's symptoms are mostly resolved. telemetry neurology consulted CT negative for acute abnormality, chronic microvascular ischemic changes with volume loss.  Neurology consulted in ED CTA head and neck-patent carotid and vertebral arteries, 4 cm ascending aortic aneurysm, mild less than 50% proximal ICA stenosis bilaterally.  CT brain perfusion-no gross large vascular territory acute infarct.  Patient also had severe headache in the ED. Fioricet given without improvement.  Review of Systems: As per HPI all other systems reviewed and negative  Past Medical History:  Diagnosis Date  . Anxiety disorder   . Arthritis   . Chronic headaches   . Coronary atherosclerosis of native coronary artery      BMS nondominant RCA 12/2004  . Essential hypertension, benign   . GERD (gastroesophageal reflux disease)   . History of adenomatous polyp of colon   . History of kidney stones   . History of MI (myocardial infarction) 12/2004   s/p  BMS to RCA  . Hyperlipidemia   . Internal hemorrhoids   . Nocturia more than twice per night   . Numbness of left foot   . S/p bare metal coronary artery stent 12/2004   BMS x1 to RCA  . Spinal stenosis   . Type 2 diabetes mellitus treated with insulin ([place])   . Urgency of urination   . Wears dentures    upper    Past Surgical History:  Procedure Laterality Date  . CARDIAC CATHETERIZATION  06-14-2004  dr Lia Foyer   total occlusion mD1, RI 30-40%, mRCA nondominant hazy 80% and scattered 30-40%,  ef 71% (medical mangement)  . CARDIOVASCULAR STRESS TEST  09-17-2015   dr Domenic Polite   Low risk nuclear study w/ reversible mild small anteroapical defect,  normal LV function and wall motion, nuclear stress ef 61%  . CATARACT EXTRACTION W/ INTRAOCULAR LENS IMPLANT Right 07/2014  . COLONOSCOPY  09/10/2008   normal  . CORONARY ANGIOPLASTY WITH STENT PLACEMENT  12-17-2004   dr benismhon/ dr Olevia Perches   nonobstructive cad LAD and LCFx,  BMS x1 to total occlusion RCA   . CYSTOSCOPY W/ URETEROSCOPY W/ LITHOTRIPSY  08/2010  . ERCP  03/14/2008  . LAPAROSCOPIC CHOLECYSTECTOMY  05/2012  . LEFT HEART CATHETERIZATION WITH CORONARY ANGIOGRAM N/A  05/14/2011   Procedure: LEFT HEART CATHETERIZATION WITH CORONARY ANGIOGRAM;  Surgeon: Sherren Mocha, MD;  Location: [facility] CATH LAB;  Service: Cardiovascular;  Laterality: N/A;    non-obstructive LM, LAD, LCFx, patent RCA stent  . LUMBAR LAMINECTOMY/DECOMPRESSION MICRODISCECTOMY N/A 02/24/2017   Procedure: Microlumbar decompression L4-5, L5-S1;  Surgeon: Susa Day, MD;  Location: [facility] ORS;  Service: Orthopedics;  Laterality: N/A;  120 mins  . ROTATOR CUFF REPAIR Right 03/2002  . ROTATOR CUFF REPAIR Left 12/11/2015  . TOTAL HIP  ARTHROPLASTY Right 12/20/2008  . TOTAL KNEE ARTHROPLASTY Bilateral left 12-11-2003/  right 07-31-2004   dr Wynelle Link [facility]  . UPPER GASTROINTESTINAL ENDOSCOPY  01/08/2008   bx, inlet patch, duodenitis  . YAG LASER APPLICATION Right 3/33/5456   Procedure: YAG LASER APPLICATION;  Surgeon: Williams Che, MD;  Location: [facility];  Service: Ophthalmology;  Laterality: Right;     reports that he quit smoking about 29 years ago. His smoking use included cigarettes. He started smoking about 54 years ago. He has a 20.00 pack-year smoking history. He quit smokeless tobacco use about 28 years ago.  His smokeless tobacco use included chew. He reports that he does not drink alcohol or use drugs.  Allergies  Allergen Reactions  . Ace Inhibitors Hives, Swelling and Rash    Rash,hives,tongue swelling  . Angiotensin Receptor Blockers     Unknown reaction   . Ciprofloxacin Other (See Comments)    confusion  . Other Hives and Other (See Comments)    Altaseptic, altase (cough)  . Oxycodone-Acetaminophen Other (See Comments)    REACTION: unknown reaction  . Ramipril Cough  . Robaxin [Methocarbamol] Other (See Comments)    Confusion   . Toradol [Ketorolac Tromethamine] Other (See Comments)    confusion  . Valium Other (See Comments)    Hallucinations; confusion  . Doxycycline Hives and Rash    Family History  Problem Relation Age of Onset  . Colon cancer Mother        Diagnosed age 46  . Cancer Mother 50  . Heart disease Father   . Heart attack Father   . Breast cancer Sister   . Heart disease Brother   . Brain cancer Sister   . Heart disease Brother   . Bladder Cancer Brother   . Cancer - Other Brother     Prior to Admission medications   Medication Sig Start Date End Date Taking? Authorizing Provider  acetaminophen (TYLENOL) 650 MG CR tablet Take 1,300 mg by mouth 2 (two) times daily as needed for pain.     [provider]  amLODipine (NORVASC) 5 MG tablet TAKE 1 TABLET BY MOUTH   DAILY 11/30/17   Dettinger, Fransisca Kaufmann, MD  aspirin 81 MG tablet Take 1 tablet (81 mg total) by mouth every evening. Resume 4 days post-op 02/25/17   Cecilie Kicks, PA-C  aspirin-acetaminophen-caffeine (EXCEDRIN EXTRA STRENGTH) [phone] MG tablet Take 1 tablet by mouth daily as needed (headaches). May resume 5 days post-op as needed 02/25/17   Cecilie Kicks, PA-C  atorvastatin (LIPITOR) 20 MG tablet TAKE 1 TABLET BY MOUTH  DAILY AT 6 PM. 11/30/17   Dettinger, Fransisca Kaufmann, MD  baclofen (LIORESAL) 10 MG tablet Take 1 tablet (10 mg total) by mouth at bedtime as needed for muscle spasms. 06/27/17   Dettinger, Fransisca Kaufmann, MD  butalbital-acetaminophen-caffeine (FIORICET, ESGIC) [phone] MG tablet Take 1 tablet by mouth 2 (two) times daily as needed for headache. 01/17/17   Terald Sleeper, PA-C  cetirizine (ZYRTEC) 10 MG tablet Take 10 mg by mouth every evening.     [provider]  Cholecalciferol (VITAMIN D3) 2000 UNITS TABS Take 2,000 Units by mouth every morning.     [provider]  docusate sodium (COLACE) 100 MG capsule Take 1 capsule (100 mg total) by mouth 2 (two) times daily. 02/24/17 02/24/18  Susa Day, MD  famotidine (PEPCID) 20 MG tablet TAKE 1 TABLET BY MOUTH TWO  TIMES DAILY 11/30/17   Dettinger, Fransisca Kaufmann, MD  HUMALOG KWIKPEN 100 UNIT/ML KwikPen INJECT 0 TO 15 UNITS  SUBCUTANEOUSLY 3 TIMES  DAILY BEFORE MEALS  (70-150=8 UNITS, 151-200=9  UNITS) 11/30/17   Dettinger, Fransisca Kaufmann, MD  HYDROcodone-acetaminophen (NORCO) 10-325 MG tablet Take 1 tablet by mouth every 4 (four) hours as needed for severe pain. 02/24/17   Susa Day, MD  Insulin Pen Needle (B-D ULTRAFINE III SHORT PEN) 31G X 8 MM MISC USE 4 TIMES DAILY DX E11.49 11/18/14   Wardell Honour, MD  LANTUS SOLOSTAR 100 UNIT/ML Solostar Pen INJECT SUBCUTANEOUSLY 52  UNITS AT BEDTIME 07/11/17   Dettinger, Fransisca Kaufmann, MD  losartan (COZAAR) 50 MG tablet TAKE 1 TABLET BY MOUTH  DAILY 11/30/17   Dettinger, Fransisca Kaufmann, MD  Menthol,  Topical Analgesic, (BLUE-EMU MAXIMUM STRENGTH EX) Apply 1 application topically daily as needed (muscle pain).    [provider]  metFORMIN (GLUCOPHAGE-XR) 500 MG 24 hr tablet TAKE 2 TABLETS BY MOUTH TWO TIMES DAILY 11/30/17   Dettinger, Fransisca Kaufmann, MD  nitroGLYCERIN (NITROSTAT) 0.4 MG SL tablet DISSOLVE ONE TABLET UNDER THE TONGUE EVERY 5 MINUTES AS NEEDED FOR CHEST PAIN.  DO NOT EXCEED A TOTAL OF 3 DOSES IN 15 MINUTES 02/03/17   Satira Sark, MD  ONE TOUCH ULTRA TEST test strip USE 1 STRIP TO CHECK GLUCOSE 4 TIMES DAILY 09/05/17   Dettinger, Fransisca Kaufmann, MD  PARoxetine (PAXIL) 20 MG tablet TAKE 1 TABLET BY MOUTH  DAILY 11/30/17   Dettinger, Fransisca Kaufmann, MD  polyethylene glycol (MIRALAX / GLYCOLAX) packet Take 17 g by mouth daily. 02/24/17   Susa Day, MD  tamsulosin (FLOMAX) 0.4 MG CAPS capsule TAKE 1 CAPSULE BY MOUTH  DAILY 11/30/17   Dettinger, Fransisca Kaufmann, MD    Physical Exam: Vitals:   12/14/17 1925 12/14/17 1930 12/14/17 2028 12/14/17 2038  BP:  (!) 166/73  (!) 153/95  Pulse: 61 61 63 62  Resp: 20 16 19 17   Temp:      TempSrc:      SpO2: 97% 94% 100% 100%    Constitutional: At the time of my evaluation patient agitated,vomiting in the ED, calmed down with IV morphine and Phenergan.  Vitals:   12/14/17 1925 12/14/17 1930 12/14/17 2028 12/14/17 2038  BP:  (!) 166/73  (!) 153/95  Pulse: 61 61 63 62  Resp: 20 16 19 17   Temp:      TempSrc:      SpO2: 97% 94% 100% 100%   Eyes: PERRL, lids and conjunctivae normal ENMT: Mucous membranes are moist. Posterior pharynx clear of any exudate or lesions. Neck: normal, supple, no masses, no thyromegaly Respiratory: clear to auscultation bilaterally, no wheezing, no crackles. Normal respiratory effort. No accessory muscle use.  Cardiovascular: Regular rate and rhythm, no murmurs / rubs / gallops. No extremity edema. 2+ pedal pulses. No carotid bruits.  Abdomen: no tenderness abdomen full, but at baseline, no masses palpated. No  hepatosplenomegaly. Bowel sounds positive.  Musculoskeletal: no clubbing / cyanosis. No joint  deformity upper and lower extremities. Good ROM, no contractures. Normal muscle tone.  Skin: no rashes, lesions, ulcers. No induration Neurologic: CN 2-12 grossly intact. Sensation intact, DTR normal. Strength 5/5 in all 4.  Psychiatric: Normal judgment and insight. Alert and oriented x 3. Normal mood.   Labs on Admission: I have personally reviewed following labs and imaging studies  CBC: Recent Labs  Lab 12/14/17 1857 12/14/17 1904  WBC 7.8  --   NEUTROABS 4.4  --   HGB 14.1 14.6  HCT 45.1 43.0  MCV 90.2  --   PLT 285  --    Basic Metabolic Panel: Recent Labs  Lab 12/14/17 1857 12/14/17 1904  NA 136 138  K 3.5 3.6  CL 104 103  CO2 24  --   GLUCOSE 73 68*  BUN 17 17  CREATININE 0.92 0.90  CALCIUM 9.0  --    Liver Function Tests: Recent Labs  Lab 12/14/17 1857  AST 16  ALT 19  ALKPHOS 65  BILITOT 0.4  PROT 7.4  ALBUMIN 3.8   Coagulation Profile: Recent Labs  Lab 12/14/17 1857  INR 0.94   Urine analysis:    Component Value Date/Time   COLORURINE YELLOW 12/14/2017 1848   APPEARANCEUR CLEAR 12/14/2017 1848   LABSPEC 1.040 (H) 12/14/2017 1848   PHURINE 5.0 12/14/2017 1848   GLUCOSEU 150 (A) 12/14/2017 1848   HGBUR NEGATIVE 12/14/2017 1848   BILIRUBINUR NEGATIVE 12/14/2017 1848   BILIRUBINUR neg 12/10/2014 1435   [place] 12/14/2017 1848   PROTEINUR 30 (A) 12/14/2017 1848   UROBILINOGEN negative 12/10/2014 1435   UROBILINOGEN 0.2 12/09/2008 1006   NITRITE NEGATIVE 12/14/2017 1848   LEUKOCYTESUR NEGATIVE 12/14/2017 1848    Radiological Exams on Admission: Ct Angio Head W Or Wo Contrast  Result Date: 12/14/2017 CLINICAL DATA:  77 y/o  M; right facial droop. EXAM: CT ANGIOGRAPHY HEAD AND NECK CT PERFUSION BRAIN TECHNIQUE: Multidetector CT imaging of the head and neck was performed using the standard protocol during bolus administration of intravenous  contrast. Multiplanar CT image reconstructions and MIPs were obtained to evaluate the vascular anatomy. Carotid stenosis measurements (when applicable) are obtained utilizing NASCET criteria, using the distal internal carotid diameter as the denominator. Multiphase CT imaging of the brain was performed following IV bolus contrast injection. Subsequent parametric perfusion maps were calculated using RAPID software. CONTRAST:  167mL ISOVUE-370 IOPAMIDOL (ISOVUE-370) INJECTION 76% COMPARISON:  12/14/2017 CT head. FINDINGS: CTA NECK FINDINGS Aortic arch: Standard branching. Imaged portion shows no evidence of dissection. No significant stenosis of the major arch vessel origins. 4 cm ascending aorta. Calcific atherosclerosis. Right carotid system: No evidence of dissection, stenosis (50% or greater) or occlusion. Calcific atherosclerosis of the carotid bifurcation with mild less than 50% proximal ICA stenosis. Left carotid system: No evidence of dissection, stenosis (50% or greater) or occlusion. Calcific atherosclerosis of the carotid bifurcation with mild less than 50% proximal ICA stenosis. Vertebral arteries: Left dominant. No evidence of dissection, stenosis (50% or greater) or occlusion. Skeleton: Moderate cervical spondylosis with multilevel disc and facet degenerative changes greatest at the C5-C7 levels. C4-5 grade 1 anterolisthesis. Uncovertebral and facet hypertrophy results in right-sided bony foraminal encroachment at C5-C7 and left-sided bony foraminal encroachment at C4-C7. Multifactorial moderate to severe spinal canal stenosis at C5-6. Other neck: Negative. Upper chest: Severe coronary artery calcific atherosclerosis, partially visualized. Mildly patulous thoracic esophagus. Review of the MIP images confirms the above findings CTA HEAD FINDINGS Anterior circulation: No significant stenosis, proximal occlusion, aneurysm, or  vascular malformation. Non stenotic calcific atherosclerosis of the carotid  siphons. Posterior circulation: No significant stenosis, proximal occlusion, aneurysm, or vascular malformation. Non stenotic calcific atherosclerosis of the bilateral vertebral arteries. Venous sinuses: As permitted by contrast timing, patent. Anatomic variants: Fetal right PCA. Review of the MIP images confirms the above findings CT Brain Perfusion Findings: CBF (<30%) Volume: 85mL Perfusion (Tmax>6.0s) volume: 71mL Mismatch Volume: 79mL Infarction Location:No acute stroke by perfusion criteria. Increased T-max corresponds to the left parieto-occipital junction, however, there is translational motion artifact on the CT perfusion post processing misregistration artifact. The motion is continuous throughout the scan. IMPRESSION: CTA neck: 1. Patent carotid and vertebral arteries. No dissection, aneurysm, or hemodynamically significant stenosis by NASCET criteria. 2. 4 cm ascending aortic aneurysm. Recommend annual imaging followup by CTA or MRA. This recommendation follows 2010 ACCF/AHA/AATS/ACR/ASA/SCA/SCAI/SIR/STS/SVM Guidelines for the Diagnosis and Management of Patients with Thoracic Aortic Disease. 2010; 121: I297-L892. 3. Calcific atherosclerosis of bilateral carotid bifurcations with mild less than 50% proximal ICA stenosis bilaterally. 4. Advanced cervical spondylosis with multifactorial moderate to severe C5-6 spinal canal stenosis. CTA head: Patent anterior and posterior intracranial circulation. No large vessel occlusion, aneurysm, or significant stenosis. CT brain perfusion: Motion artifact resulting post processing misregistration. Small areas of perfusion anomaly may be obscured. No gross large vascular territory acute infarct by perfusion criteria. Electronically Signed   By: Kristine Garbe M.D.   On: 12/14/2017 20:38   Ct Angio Neck W And/or Wo Contrast  Result Date: 12/14/2017 CLINICAL DATA:  77 y/o  M; right facial droop. EXAM: CT ANGIOGRAPHY HEAD AND NECK CT PERFUSION BRAIN TECHNIQUE:  Multidetector CT imaging of the head and neck was performed using the standard protocol during bolus administration of intravenous contrast. Multiplanar CT image reconstructions and MIPs were obtained to evaluate the vascular anatomy. Carotid stenosis measurements (when applicable) are obtained utilizing NASCET criteria, using the distal internal carotid diameter as the denominator. Multiphase CT imaging of the brain was performed following IV bolus contrast injection. Subsequent parametric perfusion maps were calculated using RAPID software. CONTRAST:  168mL ISOVUE-370 IOPAMIDOL (ISOVUE-370) INJECTION 76% COMPARISON:  12/14/2017 CT head. FINDINGS: CTA NECK FINDINGS Aortic arch: Standard branching. Imaged portion shows no evidence of dissection. No significant stenosis of the major arch vessel origins. 4 cm ascending aorta. Calcific atherosclerosis. Right carotid system: No evidence of dissection, stenosis (50% or greater) or occlusion. Calcific atherosclerosis of the carotid bifurcation with mild less than 50% proximal ICA stenosis. Left carotid system: No evidence of dissection, stenosis (50% or greater) or occlusion. Calcific atherosclerosis of the carotid bifurcation with mild less than 50% proximal ICA stenosis. Vertebral arteries: Left dominant. No evidence of dissection, stenosis (50% or greater) or occlusion. Skeleton: Moderate cervical spondylosis with multilevel disc and facet degenerative changes greatest at the C5-C7 levels. C4-5 grade 1 anterolisthesis. Uncovertebral and facet hypertrophy results in right-sided bony foraminal encroachment at C5-C7 and left-sided bony foraminal encroachment at C4-C7. Multifactorial moderate to severe spinal canal stenosis at C5-6. Other neck: Negative. Upper chest: Severe coronary artery calcific atherosclerosis, partially visualized. Mildly patulous thoracic esophagus. Review of the MIP images confirms the above findings CTA HEAD FINDINGS Anterior circulation: No  significant stenosis, proximal occlusion, aneurysm, or vascular malformation. Non stenotic calcific atherosclerosis of the carotid siphons. Posterior circulation: No significant stenosis, proximal occlusion, aneurysm, or vascular malformation. Non stenotic calcific atherosclerosis of the bilateral vertebral arteries. Venous sinuses: As permitted by contrast timing, patent. Anatomic variants: Fetal right PCA. Review of the MIP images confirms the above findings  CT Brain Perfusion Findings: CBF (<30%) Volume: 61mL Perfusion (Tmax>6.0s) volume: 38mL Mismatch Volume: 52mL Infarction Location:No acute stroke by perfusion criteria. Increased T-max corresponds to the left parieto-occipital junction, however, there is translational motion artifact on the CT perfusion post processing misregistration artifact. The motion is continuous throughout the scan. IMPRESSION: CTA neck: 1. Patent carotid and vertebral arteries. No dissection, aneurysm, or hemodynamically significant stenosis by NASCET criteria. 2. 4 cm ascending aortic aneurysm. Recommend annual imaging followup by CTA or MRA. This recommendation follows 2010 ACCF/AHA/AATS/ACR/ASA/SCA/SCAI/SIR/STS/SVM Guidelines for the Diagnosis and Management of Patients with Thoracic Aortic Disease. 2010; 121: C623-J628. 3. Calcific atherosclerosis of bilateral carotid bifurcations with mild less than 50% proximal ICA stenosis bilaterally. 4. Advanced cervical spondylosis with multifactorial moderate to severe C5-6 spinal canal stenosis. CTA head: Patent anterior and posterior intracranial circulation. No large vessel occlusion, aneurysm, or significant stenosis. CT brain perfusion: Motion artifact resulting post processing misregistration. Small areas of perfusion anomaly may be obscured. No gross large vascular territory acute infarct by perfusion criteria. Electronically Signed   By: Kristine Garbe M.D.   On: 12/14/2017 20:38   Ct Cerebral Perfusion W Contrast  Result  Date: 12/14/2017 CLINICAL DATA:  77 y/o  M; right facial droop. EXAM: CT ANGIOGRAPHY HEAD AND NECK CT PERFUSION BRAIN TECHNIQUE: Multidetector CT imaging of the head and neck was performed using the standard protocol during bolus administration of intravenous contrast. Multiplanar CT image reconstructions and MIPs were obtained to evaluate the vascular anatomy. Carotid stenosis measurements (when applicable) are obtained utilizing NASCET criteria, using the distal internal carotid diameter as the denominator. Multiphase CT imaging of the brain was performed following IV bolus contrast injection. Subsequent parametric perfusion maps were calculated using RAPID software. CONTRAST:  185mL ISOVUE-370 IOPAMIDOL (ISOVUE-370) INJECTION 76% COMPARISON:  12/14/2017 CT head. FINDINGS: CTA NECK FINDINGS Aortic arch: Standard branching. Imaged portion shows no evidence of dissection. No significant stenosis of the major arch vessel origins. 4 cm ascending aorta. Calcific atherosclerosis. Right carotid system: No evidence of dissection, stenosis (50% or greater) or occlusion. Calcific atherosclerosis of the carotid bifurcation with mild less than 50% proximal ICA stenosis. Left carotid system: No evidence of dissection, stenosis (50% or greater) or occlusion. Calcific atherosclerosis of the carotid bifurcation with mild less than 50% proximal ICA stenosis. Vertebral arteries: Left dominant. No evidence of dissection, stenosis (50% or greater) or occlusion. Skeleton: Moderate cervical spondylosis with multilevel disc and facet degenerative changes greatest at the C5-C7 levels. C4-5 grade 1 anterolisthesis. Uncovertebral and facet hypertrophy results in right-sided bony foraminal encroachment at C5-C7 and left-sided bony foraminal encroachment at C4-C7. Multifactorial moderate to severe spinal canal stenosis at C5-6. Other neck: Negative. Upper chest: Severe coronary artery calcific atherosclerosis, partially visualized. Mildly  patulous thoracic esophagus. Review of the MIP images confirms the above findings CTA HEAD FINDINGS Anterior circulation: No significant stenosis, proximal occlusion, aneurysm, or vascular malformation. Non stenotic calcific atherosclerosis of the carotid siphons. Posterior circulation: No significant stenosis, proximal occlusion, aneurysm, or vascular malformation. Non stenotic calcific atherosclerosis of the bilateral vertebral arteries. Venous sinuses: As permitted by contrast timing, patent. Anatomic variants: Fetal right PCA. Review of the MIP images confirms the above findings CT Brain Perfusion Findings: CBF (<30%) Volume: 61mL Perfusion (Tmax>6.0s) volume: 79mL Mismatch Volume: 75mL Infarction Location:No acute stroke by perfusion criteria. Increased T-max corresponds to the left parieto-occipital junction, however, there is translational motion artifact on the CT perfusion post processing misregistration artifact. The motion is continuous throughout the scan. IMPRESSION: CTA neck: 1.  Patent carotid and vertebral arteries. No dissection, aneurysm, or hemodynamically significant stenosis by NASCET criteria. 2. 4 cm ascending aortic aneurysm. Recommend annual imaging followup by CTA or MRA. This recommendation follows 2010 ACCF/AHA/AATS/ACR/ASA/SCA/SCAI/SIR/STS/SVM Guidelines for the Diagnosis and Management of Patients with Thoracic Aortic Disease. 2010; 121: W098-J191. 3. Calcific atherosclerosis of bilateral carotid bifurcations with mild less than 50% proximal ICA stenosis bilaterally. 4. Advanced cervical spondylosis with multifactorial moderate to severe C5-6 spinal canal stenosis. CTA head: Patent anterior and posterior intracranial circulation. No large vessel occlusion, aneurysm, or significant stenosis. CT brain perfusion: Motion artifact resulting post processing misregistration. Small areas of perfusion anomaly may be obscured. No gross large vascular territory acute infarct by perfusion criteria.  Electronically Signed   By: Kristine Garbe M.D.   On: 12/14/2017 20:38   Ct Head Code Stroke Wo Contrast`  Result Date: 12/14/2017 CLINICAL DATA:  Code stroke. 77 y/o M; right-sided facial droop and slurred speech since 6 p.m. EXAM: CT HEAD WITHOUT CONTRAST TECHNIQUE: Contiguous axial images were obtained from the base of the skull through the vertex without intravenous contrast. COMPARISON:  12/06/2014 MRI head and CT head. FINDINGS: Brain: No evidence of acute infarction, hemorrhage, hydrocephalus, extra-axial collection or mass lesion/mass effect. Chronic microvascular ischemic changes and volume loss of the brain. Vascular: Calcific atherosclerosis of carotid siphons. No hyperdense vessel identified. Skull: Normal. Negative for fracture or focal lesion. Sinuses/Orbits: Right mastoid opacification. Additional visible paranasal sinuses and mastoid air cells are otherwise normally aerated. Right intra-ocular lens replacement Other: None. ASPECTS [facility] Stroke Program Early CT Score) - Ganglionic level infarction (caudate, lentiform nuclei, internal capsule, insula, M1-M3 cortex): 7 - Supraganglionic infarction (M4-M6 cortex): 3 Total score (0-10 with 10 being normal): 10 IMPRESSION: 1. No acute intracranial abnormality identified. 2. ASPECTS is 10. 3. Stable chronic microvascular ischemic changes and volume loss of the brain. 4. Right mastoid opacification. These results were called by telephone at the time of interpretation on 12/14/2017 at 6:58 pm to Dr. Virgel Manifold , who verbally acknowledged these results. Electronically Signed   By: Kristine Garbe M.D.   On: 12/14/2017 19:02    EKG: Independently reviewed.  QTc 417, no significant no ST or T wave abnormality.  Unchanged from prior.  Assessment/Plan Active Problems:   Word finding difficulty   Word finding difficulty- ? Right-sided facial droop-all symptoms mostly resolved at the time of my evaluation. ? TIA.  Blood glucose  73, ?  Component of hypoglycemia Vs migarines, CTA head, and perfusion imaging no large infarcts no large vessel occlusion.  325 mg aspirin given in ED -Brain MRI -PT evaluation -Echocardiogram -Lipitor, aspirin -CTA neck-mild less than 50% bilateral carotid artery stenosis. Defer carotid ultrasound. - Hgba1c. Lipid panel  Altered mental status- No symptoms to suggest infectious etiology at this time.  Head CT without acute abnormality.  Febrile without leukoco-cytosis.  UDS and alcohol level negative.  UA clean.  ?component of hypoglycemia-blood glucose 73 on ED arrival. Vomiting in the ED likely 2/2 severe headache, resolved with pain control. Last BM yesterday. No Abdominal Pain. - Port Chest Xray - TSH - B12, Folate -If persistent vomiting consider abdominal imaging.  CAD- with MI, hx of BMS to RCA 2006. Follows with Dr. Domenic Polite.  Stable, no chest pain no difficulty breathing.  EKG unchanged from prior. I- stat Trop negative -Continue aspirin, Lipitor  DM-glucose 73.  Home medications Lantus 52 units daily, 10U novolog with meals.  And metformin. - Hgba1c - SSI - Hold home lantus, Novolog  HTN-160s to 170s. -resume home antiHTns  DVT prophylaxis: heparin Code Status: Full Family Communication: none at bedside Disposition Plan: Per rounding team Consults called: none Admission status: obs, tele   Bethena Roys MD [facility] Pager [phone] From 3PM-11AM.  Otherwise please contact night-coverage [email or web address] Password [email or web address]  12/14/2017, 9:06 PM

## 2017-12-14 NOTE — ED Notes (Signed)
Patient given 4 mg's- of Zofran in CT due to active vomiting. Patient agitated and restless at this time stating he wants to leave. Patient's linen and gown changed. Patient complaining of severe headache at this time.

## 2017-12-14 NOTE — ED Notes (Signed)
Pt to CT at this time.

## 2017-12-14 NOTE — Consult Note (Signed)
[facility] Tele[facility]urology Consult Services   Date of Service:   12/14/2017 18:57:10  Impression:     .  RO Acute Ischemic Stroke     .  Transient Ischemic Attack  Comments: Patient presented with difficulty understanding and speaking, also noted right facial droop started at around 1700 today. His wife said this happened to him last night too and by the time they were home it was resolved. When i saw him today he is almost back to his baseline was answering my questions and reading from the card, no aphasia. no facial droop. - TIA work up - ASA - AAA work up and follow  Mechanism of Stroke: Possible Thromboembolic Possible Cardioembolic  Metrics: Last Known Well: 12/14/2017 17:00:00 [facility] Notification Time: 12/14/2017 18:56:24 Arrival Time: 12/14/2017 18:40:00 Stamp Time: 12/14/2017 18:57:10 Time First Login Attempt: 12/14/2017 19:07:00 Video Start Time: 12/14/2017 19:07:00  Symptoms: Trouble speaking. NIHSS Start Assessment Time: 12/14/2017 19:10:00 Patient is not a candidate for tPA. Patient was not deemed candidate for tPA thrombolytics because of Resolving, NIHSS 0 now. Video End Time: 12/14/2017 19:20:12  CT head showed no acute hemorrhage or acute core infarct. CT head was reviewed.  Advanced imaging was reviewed, No Indication of Large Vessel Occlusive Thrombus. Advanced imaging CTA head and neck obtained. Advanced imaging CTP obtained.     IMPRESSION: CTA neck:   1. Patent carotid and vertebral arteries. No dissection, aneurysm, or hemodynamically significant stenosis by NASCET criteria. 2. 4 cm ascending aortic aneurysm. Recommend annual imaging followup by CTA or MRA. This recommendation follows 2010 ACCF/AHA/AATS/ACR/ASA/SCA/SCAI/SIR/STS/SVM Guidelines for the Diagnosis and Management of Patients with Thoracic Aortic Disease. 2010; 121: A416-S063. 3. Calcific atherosclerosis of bilateral carotid bifurcations with mild less  than 50% proximal ICA stenosis bilaterally. 4. Advanced cervical spondylosis with multifactorial moderate to severe C5-6 spinal canal stenosis.   CTA head:   Patent anterior and posterior intracranial circulation. No large vessel occlusion, aneurysm, or significant stenosis.   CT brain perfusion:   Motion artifact resulting post processing misregistration. Small areas of perfusion anomaly may be obscured. No gross large vascular territory acute infarct by perfusion criteria.   Radiologist was not called back for review of advanced imaging because i reviewed ER Physician notified of the decision on thrombolytics management on 12/14/2017 19:20:14   Our recommendations are outlined below.  Recommendations:     .  Activate Stroke Protocol Admission/Order Set     .  Stroke/Telemetry Floor     .  Neuro Checks     .  Bedside Swallow Eval     .  DVT Prophylaxis     .  IV Fluids, Normal Saline     .  Head of Bed Below 30 Degrees     .  Euglycemia and Avoid Hyperthermia (PRN Acetaminophen)     .  Antiplatelet Therapy Recommended  Routine Consultation with [facility] for Follow up Care  Sign Out:     .  Discussed with Emergency Department Provider    ------------------------------------------------------------------------------  History of Present Illness: Patient is a 77 year old Male.  Patient was brought by EMS for symptoms of Trouble speaking.  Patient presented with difficulty understanding and speaking, also noted right facial droop started at around 1700 today. His wife said this happened to him last night too and by the time they were home it was resolved. When i saw him today he is almost back to his baseline was answering my questions and reading from the card, no aphasia.  no facial droop.  CT head showed no acute hemorrhage or acute core infarct. CT head was reviewed.    Examination: BP(160/77), Blood Glucose(n.a) 1A: Level of Consciousness - Alert;  keenly responsive + 0 1B: Ask Month and Age - Both Questions Right + 0 1C: Blink Eyes & Squeeze Hands - Performs Both Tasks + 0 2: Test Horizontal Extraocular Movements - Normal + 0 3: Test Visual Fields - No Visual Loss + 0 4: Test Facial Palsy (Use Grimace if Obtunded) - Normal symmetry + 0 5A: Test Left Arm Motor Drift - No Drift for 10 Seconds + 0 5B: Test Right Arm Motor Drift - No Drift for 10 Seconds + 0 6A: Test Left Leg Motor Drift - No Drift for 5 Seconds + 0 6B: Test Right Leg Motor Drift - No Drift for 5 Seconds + 0 7: Test Limb Ataxia (FNF/Heel-Shin) - No Ataxia + 0 8: Test Sensation - Normal; No sensory loss + 0 9: Test Language/Aphasia - Normal; No aphasia + 0 10: Test Dysarthria - Normal + 0 11: Test Extinction/Inattention - No abnormality + 0  NIHSS Score: 0  Patient was informed the Neurology Consult would happen via TeleHealth consult by way of interactive audio and video telecommunications and consented to receiving care in this manner.  Due to the immediate potential for life-threatening deterioration due to underlying acute neurologic illness, I spent 35 minutes providing critical care. This time includes time for face to face visit via telemedicine, review of medical records, imaging studies and discussion of findings with providers, the patient and/or family.   Dr Lemar Livings   [facility] [phone]  Case [id number]

## 2017-12-15 ENCOUNTER — Other Ambulatory Visit: Payer: Self-pay

## 2017-12-15 ENCOUNTER — Telehealth (HOSPITAL_COMMUNITY): Payer: Self-pay | Admitting: Family Medicine

## 2017-12-15 ENCOUNTER — Ambulatory Visit (HOSPITAL_COMMUNITY): Payer: Medicare Other

## 2017-12-15 ENCOUNTER — Encounter (HOSPITAL_COMMUNITY): Payer: Self-pay

## 2017-12-15 ENCOUNTER — Observation Stay (HOSPITAL_COMMUNITY): Payer: Medicare Other

## 2017-12-15 ENCOUNTER — Other Ambulatory Visit (HOSPITAL_COMMUNITY): Payer: Self-pay

## 2017-12-15 ENCOUNTER — Other Ambulatory Visit (HOSPITAL_COMMUNITY): Payer: Self-pay | Admitting: *Deleted

## 2017-12-15 ENCOUNTER — Telehealth (HOSPITAL_COMMUNITY): Payer: Self-pay

## 2017-12-15 DIAGNOSIS — R4789 Other speech disturbances: Secondary | ICD-10-CM

## 2017-12-15 DIAGNOSIS — J90 Pleural effusion, not elsewhere classified: Secondary | ICD-10-CM | POA: Diagnosis not present

## 2017-12-15 DIAGNOSIS — R29810 Facial weakness: Secondary | ICD-10-CM | POA: Diagnosis not present

## 2017-12-15 LAB — HEMOGLOBIN A1C
HEMOGLOBIN A1C: 7.3 % — AB (ref 4.8–5.6)
MEAN PLASMA GLUCOSE: 162.81 mg/dL

## 2017-12-15 LAB — LIPID PANEL
Cholesterol: 128 mg/dL (ref 0–200)
HDL: 34 mg/dL — ABNORMAL LOW (ref >40)
LDL Cholesterol: 64 mg/dL (ref 0–99)
Total CHOL/HDL Ratio: 3.8 RATIO
Triglycerides: 150 mg/dL — ABNORMAL HIGH (ref <150)
VLDL: 30 mg/dL (ref 0–40)

## 2017-12-15 LAB — TSH: TSH: 0.93 u[IU]/mL (ref 0.350–4.500)

## 2017-12-15 LAB — GLUCOSE, CAPILLARY: GLUCOSE-CAPILLARY: 264 mg/dL — AB (ref 70–99)

## 2017-12-15 LAB — CBG MONITORING, ED
GLUCOSE-CAPILLARY: 145 mg/dL — AB (ref 70–99)
Glucose-Capillary: 168 mg/dL — ABNORMAL HIGH (ref 70–99)
Glucose-Capillary: 168 mg/dL — ABNORMAL HIGH (ref 70–99)

## 2017-12-15 LAB — VITAMIN B12: Vitamin B-12: 167 pg/mL — ABNORMAL LOW (ref 180–914)

## 2017-12-15 NOTE — Telephone Encounter (Signed)
12/15/17  wife left a message to cx said he was being admitted to the hospital

## 2017-12-15 NOTE — ED Notes (Signed)
Patient transported to MRI 

## 2017-12-15 NOTE — Plan of Care (Signed)
  Problem: Acute Rehab PT Goals(only PT should resolve) Goal: Pt will Roll Supine to Side Outcome: Progressing Flowsheets (Taken 12/15/2017 0911) Pt will Roll Supine to Side: with supervision Goal: Patient Will Transfer Sit To/From Stand Outcome: Progressing Flowsheets (Taken 12/15/2017 0911) Patient will transfer sit to/from stand: with modified independence Goal: Pt Will Transfer Bed To Chair/Chair To Bed Outcome: Progressing Flowsheets (Taken 12/15/2017 0911) Pt will Transfer Bed to Chair/Chair to Bed: with modified independence Goal: Pt Will Ambulate Outcome: Progressing Flowsheets (Taken 12/15/2017 0911) Pt will Ambulate: > 125 feet; with modified independence   9:11 AM, 12/15/17 Lonell Grandchild, MPT Physical Therapist with [facility] [phone] office [phone] mobile phone

## 2017-12-15 NOTE — Progress Notes (Signed)
Since arrival to room has been alert and oriented with no deficits and able to talk and answer questions with no difficulty.   IV removed and discharge papers signed Wife has been at bedside today.

## 2017-12-15 NOTE — Care Management (Signed)
PT recommends OP PT. Pt was supposed to have first appointment with [facility]. CM called [facility] and they will reschedule today's cancelled appointment.

## 2017-12-15 NOTE — Telephone Encounter (Signed)
Caregiver called l/m at 4pm that patient was in the [facility] and just got home today-possible stroke. They will call back to r/s when he is feeling stronger. NF 12/15/17

## 2017-12-15 NOTE — Evaluation (Signed)
Physical Therapy Evaluation Patient Details Name: [name] MRN: [id number] DOB: 1940-07-05 Today's Date: 12/15/2017   History of Present Illness  DEMONTA WOMBLES is a 77 y.o. male with medical history significant for CAD, DM, BPH, HTN, recent L4-5, L5-S1 micro-lumbar decompression early 2019, brought to [facility] with reports of word finding difficulty and confusion.  Patient was about to have dinner at a resturant when suddenly he was confused, put his cut on his plate, and seemed like he didn't know what he was doing.  His wife asked him question he could not get his words out.  Similar occurrence yesterday evening also at another restaurant.     Clinical Impression  Patient functioning near baseline for functional mobility and gait, other demonstrating slightly antalgic gait on LLE due to chronic hip pain, was supposed to start OP physical therapy today per patient, had no loss of balance and tolerated sitting up in chair after therapy.  Patient had difficulty with sitting up at side of gurney and required instruction in log rolling technique with fair carryover, has history of recent low back surgery.  Patient will benefit from continued physical therapy in hospital and recommended venue below to increase strength, balance, endurance for safe ADLs and gait.    Follow Up Recommendations Outpatient PT    Equipment Recommendations  None recommended by PT    Recommendations for Other Services       Precautions / Restrictions Precautions Precautions: None Precaution Comments: recent low back surgery early 2019 Restrictions Weight Bearing Restrictions: No      Mobility  Bed Mobility Overal bed mobility: Needs Assistance Bed Mobility: Supine to Sit;Sit to Supine     Supine to sit: Min assist Sit to supine: Min guard   General bed mobility comments: tends to launch self to sitting, instructed in log rolling technique with fair carry over  Transfers Overall transfer level:  Needs assistance Equipment used: None Transfers: Sit to/from [employer] Transfers Sit to Stand: Supervision Stand pivot transfers: Supervision       General transfer comment: slightly labored, no loss of balance  Ambulation/Gait Ambulation/Gait assistance: Supervision Gait Distance (Feet): 100 Feet Assistive device: None Gait Pattern/deviations: Decreased step length - left;Decreased stance time - right;Decreased stride length Gait velocity: decreased   General Gait Details: grossly WFL except slight antalgic gait on LLE due to chronic left hip pain, no loss of balance  Stairs            Wheelchair Mobility    Modified Rankin (Stroke Patients Only)       Balance Overall balance assessment: Mild deficits observed, not formally tested                                           Pertinent Vitals/Pain Pain Assessment: No/denies pain    Home Living Family/patient expects to be discharged to:: Private residence Living Arrangements: Spouse/significant other Available Help at Discharge: Family Type of Home: House Home Access: Stairs to enter Entrance Stairs-Rails: Right Entrance Stairs-Number of Steps: 2-3 Home Layout: Two level;Able to live on main level with bedroom/bathroom Home Equipment: Gilford Rile - 2 wheels;Bedside commode;Shower seat;Cane - single point      Prior Function Level of Independence: Independent         Comments: community ambulator, drives     Hand Dominance   Dominant Hand: Right    Extremity/Trunk Assessment  Upper Extremity Assessment Upper Extremity Assessment: Overall WFL for tasks assessed    Lower Extremity Assessment Lower Extremity Assessment: Generalized weakness;RLE deficits/detail;LLE deficits/detail RLE Deficits / Details: grossly 5/5 LLE Deficits / Details: grossly -4/5    Cervical / Trunk Assessment Cervical / Trunk Assessment: Normal  Communication   Communication: No difficulties   Cognition Arousal/Alertness: Awake/alert Behavior During Therapy: WFL for tasks assessed/performed Overall Cognitive Status: Within Functional Limits for tasks assessed                                        General Comments      Exercises     Assessment/Plan    PT Assessment Patient needs continued PT services  PT Problem List Decreased strength;Decreased activity tolerance;Decreased balance;Decreased mobility       PT Treatment Interventions Gait training;Functional mobility training;Stair training;Therapeutic activities;Therapeutic exercise;Patient/family education    PT Goals (Current goals can be found in the Care Plan section)  Acute Rehab PT Goals Patient Stated Goal: return home with spouse to assist PT Goal Formulation: With patient/family Time For Goal Achievement: 12/17/17 Potential to Achieve Goals: Good    Frequency 7X/week   Barriers to discharge        Co-evaluation               AM-PAC PT "6 Clicks" Mobility  Outcome Measure Help needed turning from your back to your side while in a flat bed without using bedrails?: A Little Help needed moving from lying on your back to sitting on the side of a flat bed without using bedrails?: Total Help needed moving to and from a bed to a chair (including a wheelchair)?: None Help needed standing up from a chair using your arms (e.g., wheelchair or bedside chair)?: None Help needed to walk in hospital room?: A Little Help needed climbing 3-5 steps with a railing? : A Little 6 Click Score: 18    End of Session   Activity Tolerance: Patient tolerated treatment well Patient left: in chair;with call bell/phone within reach;with family/visitor present Nurse Communication: Mobility status PT Visit Diagnosis: Unsteadiness on feet (R26.81);Other abnormalities of gait and mobility (R26.89);Muscle weakness (generalized) (M62.81)    Time: 7035-0093 PT Time Calculation (min) (ACUTE ONLY): 23  min   Charges:   PT Evaluation $PT Eval Moderate Complexity: 1 Mod PT Treatments $Therapeutic Activity: 23-37 mins        9:09 AM, 12/15/17 Lonell Grandchild, MPT Physical Therapist with [facility] [phone] office [phone] mobile phone

## 2017-12-15 NOTE — Discharge Summary (Signed)
Physician Discharge Summary  [name] UUE:[id number] DOB: 01/04/1941 DOA: 12/14/2017  PCP: Dettinger, Fransisca Kaufmann, MD  Admit date: 12/14/2017  Discharge date: 12/15/2017  Admitted From:Home  Disposition:  Home  Recommendations for Outpatient Follow-up:  1. Follow up with PCP in 1-2 weeks 2. Follow-up with cardiology Dr. Domenic Polite in 3 months as noted with consideration of echocardiography at that point. 3. Maintain on aspirin 81 mg daily as well as home statin. 4. Follow-up with neurology Dr. Merlene Laughter in 6 weeks.  Home Health: None  Equipment/Devices: None  Discharge Condition: Stable  CODE STATUS: Full  Diet recommendation: Heart Healthy/carb modified  Brief/Interim Summary: Per HPI from Dr. Denton Brick:   Discharge Diagnoses:  Active Problems:   Word finding difficulty [name] is a 77 y.o. male with medical history significant for CAD, DM, BPH, HTN, brought to [facility] with reports of word finding difficulty and confusion.  Patient was about to have dinner at a resturant when suddenly he was confused, put his cut on his plate, and seemed like he didn't know what he was doing.  His wife asked him question he could not get his words out.  Similar occurrence yesterday evening also at another restaurant.  Pulses at bedside and helps with the history.  No fevers no chills, no shortness of breath or cough, has had headaches intermittently but none in the several months/year, no dysuria no abdominal pain.  Last bowel movement yesterday.  Before patient was about to eat blood sugar was in the 100s.  EMS reported some right facial droop, which had resolved by Ed arrival..  Patient was admitted for CVA work-up with multiple imaging studies as noted below that was negative for CVA.  He is experiencing no further symptoms at this time and has been evaluated by PT with recommendations to continue outpatient physical therapy as has been scheduled for his hip.  Patient will need to  follow-up outpatient with neurology as well as his cardiologist Dr. Domenic Polite to consider 2D echocardiogram at that time.  No pertinent need noted at this time.  No other acute events have been noted during his brief admission.   Discharge Instructions  Discharge Instructions    Diet - low sodium heart healthy   Complete by:  As directed    Increase activity slowly   Complete by:  As directed      Allergies as of 12/15/2017      Reactions   Ace Inhibitors Hives, Swelling, Rash   Rash,hives,tongue swelling   Angiotensin Receptor Blockers    Unknown reaction    Ciprofloxacin Other (See Comments)   confusion   Other Hives, Other (See Comments)   Altaseptic, altase (cough)   Oxycodone-acetaminophen Other (See Comments)   REACTION: unknown reaction   Ramipril Cough   Robaxin [methocarbamol] Other (See Comments)   Confusion    Toradol [ketorolac Tromethamine] Other (See Comments)   confusion   Valium Other (See Comments)   Hallucinations; confusion   Doxycycline Hives, Rash      Medication List    TAKE these medications   acetaminophen 650 MG CR tablet Commonly known as:  TYLENOL Take 1,300 mg by mouth 2 (two) times daily as needed for pain.   amLODipine 5 MG tablet Commonly known as:  NORVASC TAKE 1 TABLET BY MOUTH  DAILY What changed:  when to take this   aspirin EC 81 MG tablet Take 81 mg by mouth every evening.   aspirin-acetaminophen-caffeine 915-056-97 MG tablet Commonly known as:  EXCEDRIN MIGRAINE Take 1 tablet by mouth daily as needed (headaches). May resume 5 days post-op as needed What changed:    reasons to take this  additional instructions   atorvastatin 20 MG tablet Commonly known as:  LIPITOR TAKE 1 TABLET BY MOUTH  DAILY AT 6 PM. What changed:  See the new instructions.   baclofen 10 MG tablet Commonly known as:  LIORESAL Take 1 tablet (10 mg total) by mouth at bedtime as needed for muscle spasms.   BLUE-EMU MAXIMUM STRENGTH EX Apply 1  application topically daily as needed (muscle pain).   butalbital-acetaminophen-caffeine 50-325-40 MG tablet Commonly known as:  FIORICET, ESGIC Take 1 tablet by mouth 2 (two) times daily as needed for headache.   cetirizine 10 MG tablet Commonly known as:  ZYRTEC Take 10 mg by mouth every evening.   docusate sodium 100 MG capsule Commonly known as:  COLACE Take 1 capsule (100 mg total) by mouth 2 (two) times daily.   famotidine 20 MG tablet Commonly known as:  PEPCID TAKE 1 TABLET BY MOUTH TWO  TIMES DAILY   HUMALOG KWIKPEN 100 UNIT/ML KwikPen Generic drug:  insulin lispro INJECT 0 TO 15 UNITS  SUBCUTANEOUSLY 3 TIMES  DAILY BEFORE MEALS  (70-150=8 UNITS, 151-200=9  UNITS) What changed:  See the new instructions.   HYDROcodone-acetaminophen 10-325 MG tablet Commonly known as:  NORCO Take 1 tablet by mouth every 4 (four) hours as needed for severe pain.   Insulin Pen Needle 31G X 8 MM Misc USE 4 TIMES DAILY DX E11.49   LANTUS SOLOSTAR 100 UNIT/ML Solostar Pen Generic drug:  Insulin Glargine INJECT SUBCUTANEOUSLY 52  UNITS AT BEDTIME What changed:  See the new instructions.   losartan 50 MG tablet Commonly known as:  COZAAR TAKE 1 TABLET BY MOUTH  DAILY What changed:  when to take this   metFORMIN 500 MG 24 hr tablet Commonly known as:  GLUCOPHAGE-XR TAKE 2 TABLETS BY MOUTH TWO TIMES DAILY What changed:  See the new instructions.   nitroGLYCERIN 0.4 MG SL tablet Commonly known as:  NITROSTAT DISSOLVE ONE TABLET UNDER THE TONGUE EVERY 5 MINUTES AS NEEDED FOR CHEST PAIN.  DO NOT EXCEED A TOTAL OF 3 DOSES IN 15 MINUTES   ONE TOUCH ULTRA TEST test strip Generic drug:  glucose blood USE 1 STRIP TO CHECK GLUCOSE 4 TIMES DAILY   PARoxetine 20 MG tablet Commonly known as:  PAXIL TAKE 1 TABLET BY MOUTH  DAILY What changed:  when to take this   polyethylene glycol packet Commonly known as:  MIRALAX / GLYCOLAX Take 17 g by mouth daily. What changed:    when to take  this  reasons to take this   tamsulosin 0.4 MG Caps capsule Commonly known as:  FLOMAX TAKE 1 CAPSULE BY MOUTH  DAILY What changed:  when to take this   Vitamin D3 50 MCG (2000 UT) Tabs Take 2,000 Units by mouth every morning.      Follow-up Information    Dettinger, Fransisca Kaufmann, MD Follow up in 1 week(s).   Specialties:  Family Medicine, Cardiology Contact information: [place] [address] [phone]        Satira Sark, MD .   Specialty:  Cardiology Contact information: [place] [address] [phone]          Allergies  Allergen Reactions  . Ace Inhibitors Hives, Swelling and Rash    Rash,hives,tongue swelling  . Angiotensin  Receptor Blockers     Unknown reaction   . Ciprofloxacin Other (See Comments)    confusion  . Other Hives and Other (See Comments)    Altaseptic, altase (cough)  . Oxycodone-Acetaminophen Other (See Comments)    REACTION: unknown reaction  . Ramipril Cough  . Robaxin [Methocarbamol] Other (See Comments)    Confusion   . Toradol [Ketorolac Tromethamine] Other (See Comments)    confusion  . Valium Other (See Comments)    Hallucinations; confusion  . Doxycycline Hives and Rash    Consultations:  None   Procedures/Studies: Ct Angio Head W Or Wo Contrast  Result Date: 12/14/2017 CLINICAL DATA:  77 y/o  M; right facial droop. EXAM: CT ANGIOGRAPHY HEAD AND NECK CT PERFUSION BRAIN TECHNIQUE: Multidetector CT imaging of the head and neck was performed using the standard protocol during bolus administration of intravenous contrast. Multiplanar CT image reconstructions and MIPs were obtained to evaluate the vascular anatomy. Carotid stenosis measurements (when applicable) are obtained utilizing NASCET criteria, using the distal internal carotid diameter as the denominator. Multiphase CT imaging of the brain was performed following IV bolus contrast injection. Subsequent parametric perfusion maps  were calculated using RAPID software. CONTRAST:  167mL ISOVUE-370 IOPAMIDOL (ISOVUE-370) INJECTION 76% COMPARISON:  12/14/2017 CT head. FINDINGS: CTA NECK FINDINGS Aortic arch: Standard branching. Imaged portion shows no evidence of dissection. No significant stenosis of the major arch vessel origins. 4 cm ascending aorta. Calcific atherosclerosis. Right carotid system: No evidence of dissection, stenosis (50% or greater) or occlusion. Calcific atherosclerosis of the carotid bifurcation with mild less than 50% proximal ICA stenosis. Left carotid system: No evidence of dissection, stenosis (50% or greater) or occlusion. Calcific atherosclerosis of the carotid bifurcation with mild less than 50% proximal ICA stenosis. Vertebral arteries: Left dominant. No evidence of dissection, stenosis (50% or greater) or occlusion. Skeleton: Moderate cervical spondylosis with multilevel disc and facet degenerative changes greatest at the C5-C7 levels. C4-5 grade 1 anterolisthesis. Uncovertebral and facet hypertrophy results in right-sided bony foraminal encroachment at C5-C7 and left-sided bony foraminal encroachment at C4-C7. Multifactorial moderate to severe spinal canal stenosis at C5-6. Other neck: Negative. Upper chest: Severe coronary artery calcific atherosclerosis, partially visualized. Mildly patulous thoracic esophagus. Review of the MIP images confirms the above findings CTA HEAD FINDINGS Anterior circulation: No significant stenosis, proximal occlusion, aneurysm, or vascular malformation. Non stenotic calcific atherosclerosis of the carotid siphons. Posterior circulation: No significant stenosis, proximal occlusion, aneurysm, or vascular malformation. Non stenotic calcific atherosclerosis of the bilateral vertebral arteries. Venous sinuses: As permitted by contrast timing, patent. Anatomic variants: Fetal right PCA. Review of the MIP images confirms the above findings CT Brain Perfusion Findings: CBF (<30%) Volume: 31mL  Perfusion (Tmax>6.0s) volume: 47mL Mismatch Volume: 33mL Infarction Location:No acute stroke by perfusion criteria. Increased T-max corresponds to the left parieto-occipital junction, however, there is translational motion artifact on the CT perfusion post processing misregistration artifact. The motion is continuous throughout the scan. IMPRESSION: CTA neck: 1. Patent carotid and vertebral arteries. No dissection, aneurysm, or hemodynamically significant stenosis by NASCET criteria. 2. 4 cm ascending aortic aneurysm. Recommend annual imaging followup by CTA or MRA. This recommendation follows 2010 ACCF/AHA/AATS/ACR/ASA/SCA/SCAI/SIR/STS/SVM Guidelines for the Diagnosis and Management of Patients with Thoracic Aortic Disease. 2010; 121: L875-I433. 3. Calcific atherosclerosis of bilateral carotid bifurcations with mild less than 50% proximal ICA stenosis bilaterally. 4. Advanced cervical spondylosis with multifactorial moderate to severe C5-6 spinal canal stenosis. CTA head: Patent anterior and posterior intracranial circulation. No large vessel occlusion, aneurysm,  or significant stenosis. CT brain perfusion: Motion artifact resulting post processing misregistration. Small areas of perfusion anomaly may be obscured. No gross large vascular territory acute infarct by perfusion criteria. Electronically Signed   By: Kristine Garbe M.D.   On: 12/14/2017 20:38   Ct Angio Neck W And/or Wo Contrast  Result Date: 12/14/2017 CLINICAL DATA:  77 y/o  M; right facial droop. EXAM: CT ANGIOGRAPHY HEAD AND NECK CT PERFUSION BRAIN TECHNIQUE: Multidetector CT imaging of the head and neck was performed using the standard protocol during bolus administration of intravenous contrast. Multiplanar CT image reconstructions and MIPs were obtained to evaluate the vascular anatomy. Carotid stenosis measurements (when applicable) are obtained utilizing NASCET criteria, using the distal internal carotid diameter as the denominator.  Multiphase CT imaging of the brain was performed following IV bolus contrast injection. Subsequent parametric perfusion maps were calculated using RAPID software. CONTRAST:  162mL ISOVUE-370 IOPAMIDOL (ISOVUE-370) INJECTION 76% COMPARISON:  12/14/2017 CT head. FINDINGS: CTA NECK FINDINGS Aortic arch: Standard branching. Imaged portion shows no evidence of dissection. No significant stenosis of the major arch vessel origins. 4 cm ascending aorta. Calcific atherosclerosis. Right carotid system: No evidence of dissection, stenosis (50% or greater) or occlusion. Calcific atherosclerosis of the carotid bifurcation with mild less than 50% proximal ICA stenosis. Left carotid system: No evidence of dissection, stenosis (50% or greater) or occlusion. Calcific atherosclerosis of the carotid bifurcation with mild less than 50% proximal ICA stenosis. Vertebral arteries: Left dominant. No evidence of dissection, stenosis (50% or greater) or occlusion. Skeleton: Moderate cervical spondylosis with multilevel disc and facet degenerative changes greatest at the C5-C7 levels. C4-5 grade 1 anterolisthesis. Uncovertebral and facet hypertrophy results in right-sided bony foraminal encroachment at C5-C7 and left-sided bony foraminal encroachment at C4-C7. Multifactorial moderate to severe spinal canal stenosis at C5-6. Other neck: Negative. Upper chest: Severe coronary artery calcific atherosclerosis, partially visualized. Mildly patulous thoracic esophagus. Review of the MIP images confirms the above findings CTA HEAD FINDINGS Anterior circulation: No significant stenosis, proximal occlusion, aneurysm, or vascular malformation. Non stenotic calcific atherosclerosis of the carotid siphons. Posterior circulation: No significant stenosis, proximal occlusion, aneurysm, or vascular malformation. Non stenotic calcific atherosclerosis of the bilateral vertebral arteries. Venous sinuses: As permitted by contrast timing, patent. Anatomic  variants: Fetal right PCA. Review of the MIP images confirms the above findings CT Brain Perfusion Findings: CBF (<30%) Volume: 4mL Perfusion (Tmax>6.0s) volume: 79mL Mismatch Volume: 10mL Infarction Location:No acute stroke by perfusion criteria. Increased T-max corresponds to the left parieto-occipital junction, however, there is translational motion artifact on the CT perfusion post processing misregistration artifact. The motion is continuous throughout the scan. IMPRESSION: CTA neck: 1. Patent carotid and vertebral arteries. No dissection, aneurysm, or hemodynamically significant stenosis by NASCET criteria. 2. 4 cm ascending aortic aneurysm. Recommend annual imaging followup by CTA or MRA. This recommendation follows 2010 ACCF/AHA/AATS/ACR/ASA/SCA/SCAI/SIR/STS/SVM Guidelines for the Diagnosis and Management of Patients with Thoracic Aortic Disease. 2010; 121: Y301-S010. 3. Calcific atherosclerosis of bilateral carotid bifurcations with mild less than 50% proximal ICA stenosis bilaterally. 4. Advanced cervical spondylosis with multifactorial moderate to severe C5-6 spinal canal stenosis. CTA head: Patent anterior and posterior intracranial circulation. No large vessel occlusion, aneurysm, or significant stenosis. CT brain perfusion: Motion artifact resulting post processing misregistration. Small areas of perfusion anomaly may be obscured. No gross large vascular territory acute infarct by perfusion criteria. Electronically Signed   By: Kristine Garbe M.D.   On: 12/14/2017 20:38   Mr Brain Wo Contrast  Result Date:  12/15/2017 CLINICAL DATA:  Right facial droop presenting yesterday. EXAM: MRI HEAD WITHOUT CONTRAST TECHNIQUE: Multiplanar, multiecho pulse sequences of the brain and surrounding structures were obtained without intravenous contrast. COMPARISON:  CT studies 12/24/2017 FINDINGS: Brain: Diffusion imaging does not show any acute or subacute infarction, with specific attention to the left  parietal region where there was a borderline perfusion abnormality. Chronic small-vessel changes of the hemispheric white matter are seen, probably unchanged from previous studies. Vascular: Major vessels at the base of the brain show flow. Skull and upper cervical spine: Negative Sinuses/Orbits: No sinus disease seen. Other: None IMPRESSION: Negative diffusion imaging. Specifically, no infarction in the left parietal region. Electronically Signed   By: Nelson Chimes M.D.   On: 12/15/2017 07:29   Ct Cerebral Perfusion W Contrast  Result Date: 12/14/2017 CLINICAL DATA:  77 y/o  M; right facial droop. EXAM: CT ANGIOGRAPHY HEAD AND NECK CT PERFUSION BRAIN TECHNIQUE: Multidetector CT imaging of the head and neck was performed using the standard protocol during bolus administration of intravenous contrast. Multiplanar CT image reconstructions and MIPs were obtained to evaluate the vascular anatomy. Carotid stenosis measurements (when applicable) are obtained utilizing NASCET criteria, using the distal internal carotid diameter as the denominator. Multiphase CT imaging of the brain was performed following IV bolus contrast injection. Subsequent parametric perfusion maps were calculated using RAPID software. CONTRAST:  153mL ISOVUE-370 IOPAMIDOL (ISOVUE-370) INJECTION 76% COMPARISON:  12/14/2017 CT head. FINDINGS: CTA NECK FINDINGS Aortic arch: Standard branching. Imaged portion shows no evidence of dissection. No significant stenosis of the major arch vessel origins. 4 cm ascending aorta. Calcific atherosclerosis. Right carotid system: No evidence of dissection, stenosis (50% or greater) or occlusion. Calcific atherosclerosis of the carotid bifurcation with mild less than 50% proximal ICA stenosis. Left carotid system: No evidence of dissection, stenosis (50% or greater) or occlusion. Calcific atherosclerosis of the carotid bifurcation with mild less than 50% proximal ICA stenosis. Vertebral arteries: Left dominant. No  evidence of dissection, stenosis (50% or greater) or occlusion. Skeleton: Moderate cervical spondylosis with multilevel disc and facet degenerative changes greatest at the C5-C7 levels. C4-5 grade 1 anterolisthesis. Uncovertebral and facet hypertrophy results in right-sided bony foraminal encroachment at C5-C7 and left-sided bony foraminal encroachment at C4-C7. Multifactorial moderate to severe spinal canal stenosis at C5-6. Other neck: Negative. Upper chest: Severe coronary artery calcific atherosclerosis, partially visualized. Mildly patulous thoracic esophagus. Review of the MIP images confirms the above findings CTA HEAD FINDINGS Anterior circulation: No significant stenosis, proximal occlusion, aneurysm, or vascular malformation. Non stenotic calcific atherosclerosis of the carotid siphons. Posterior circulation: No significant stenosis, proximal occlusion, aneurysm, or vascular malformation. Non stenotic calcific atherosclerosis of the bilateral vertebral arteries. Venous sinuses: As permitted by contrast timing, patent. Anatomic variants: Fetal right PCA. Review of the MIP images confirms the above findings CT Brain Perfusion Findings: CBF (<30%) Volume: 58mL Perfusion (Tmax>6.0s) volume: 42mL Mismatch Volume: 53mL Infarction Location:No acute stroke by perfusion criteria. Increased T-max corresponds to the left parieto-occipital junction, however, there is translational motion artifact on the CT perfusion post processing misregistration artifact. The motion is continuous throughout the scan. IMPRESSION: CTA neck: 1. Patent carotid and vertebral arteries. No dissection, aneurysm, or hemodynamically significant stenosis by NASCET criteria. 2. 4 cm ascending aortic aneurysm. Recommend annual imaging followup by CTA or MRA. This recommendation follows 2010 ACCF/AHA/AATS/ACR/ASA/SCA/SCAI/SIR/STS/SVM Guidelines for the Diagnosis and Management of Patients with Thoracic Aortic Disease. 2010; 121: J188-C166. 3.  Calcific atherosclerosis of bilateral carotid bifurcations with mild less than 50% proximal  ICA stenosis bilaterally. 4. Advanced cervical spondylosis with multifactorial moderate to severe C5-6 spinal canal stenosis. CTA head: Patent anterior and posterior intracranial circulation. No large vessel occlusion, aneurysm, or significant stenosis. CT brain perfusion: Motion artifact resulting post processing misregistration. Small areas of perfusion anomaly may be obscured. No gross large vascular territory acute infarct by perfusion criteria. Electronically Signed   By: Kristine Garbe M.D.   On: 12/14/2017 20:38   Dg Chest Port 1 View  Result Date: 12/15/2017 CLINICAL DATA:  Altered mental status. EXAM: PORTABLE CHEST 1 VIEW COMPARISON:  Chest radiograph May 31, 2012 FINDINGS: Cardiac silhouette is mildly enlarged, mediastinal is unremarkable for this low inspiratory examination with crowded vasculature markings. No focal consolidation. Small pleural effusions. Trachea projects midline and there is no pneumothorax. Included soft tissue planes and osseous structures are non-suspicious. Widened RIGHT AC joint may be postoperative. IMPRESSION: Low inspiratory examination. Mild cardiomegaly. Small pleural effusions. Electronically Signed   By: Elon Alas M.D.   On: 12/15/2017 00:18   Ct Head Code Stroke Wo Contrast`  Result Date: 12/14/2017 CLINICAL DATA:  Code stroke. 77 y/o M; right-sided facial droop and slurred speech since 6 p.m. EXAM: CT HEAD WITHOUT CONTRAST TECHNIQUE: Contiguous axial images were obtained from the base of the skull through the vertex without intravenous contrast. COMPARISON:  12/06/2014 MRI head and CT head. FINDINGS: Brain: No evidence of acute infarction, hemorrhage, hydrocephalus, extra-axial collection or mass lesion/mass effect. Chronic microvascular ischemic changes and volume loss of the brain. Vascular: Calcific atherosclerosis of carotid siphons. No hyperdense  vessel identified. Skull: Normal. Negative for fracture or focal lesion. Sinuses/Orbits: Right mastoid opacification. Additional visible paranasal sinuses and mastoid air cells are otherwise normally aerated. Right intra-ocular lens replacement Other: None. ASPECTS [facility] Stroke Program Early CT Score) - Ganglionic level infarction (caudate, lentiform nuclei, internal capsule, insula, M1-M3 cortex): 7 - Supraganglionic infarction (M4-M6 cortex): 3 Total score (0-10 with 10 being normal): 10 IMPRESSION: 1. No acute intracranial abnormality identified. 2. ASPECTS is 10. 3. Stable chronic microvascular ischemic changes and volume loss of the brain. 4. Right mastoid opacification. These results were called by telephone at the time of interpretation on 12/14/2017 at 6:58 pm to Dr. Virgel Manifold , who verbally acknowledged these results. Electronically Signed   By: Kristine Garbe M.D.   On: 12/14/2017 19:02     Discharge Exam: Vitals:   12/15/17 0800 12/15/17 0851  BP: (!) 158/78 (!) 166/81  Pulse: 74 75  Resp: (!) 22 18  Temp:  98.2 F (36.8 C)  SpO2: 96% 95%   Vitals:   12/15/17 0630 12/15/17 0718 12/15/17 0800 12/15/17 0851  BP: (!) 152/82 (!) 162/85 (!) 158/78 (!) 166/81  Pulse: 76 78 74 75  Resp: (!) 23 (!) 21 (!) 22 18  Temp:    98.2 F (36.8 C)  TempSrc:    Oral  SpO2: 92% 93% 96% 95%    General: Pt is alert, awake, not in acute distress Cardiovascular: RRR, S1/S2 +, no rubs, no gallops Respiratory: CTA bilaterally, no wheezing, no rhonchi Abdominal: Soft, NT, ND, bowel sounds + Extremities: no edema, no cyanosis    The results of significant diagnostics from this hospitalization (including imaging, microbiology, ancillary and laboratory) are listed below for reference.     Microbiology: No results found for this or any previous visit (from the past 240 hour(s)).   Labs: BNP (last 3 results) No results for input(s): BNP in the last 8760 hours. Basic Metabolic  Panel: Recent  Labs  Lab 12/14/17 1857 12/14/17 1904  NA 136 138  K 3.5 3.6  CL 104 103  CO2 24  --   GLUCOSE 73 68*  BUN 17 17  CREATININE 0.92 0.90  CALCIUM 9.0  --    Liver Function Tests: Recent Labs  Lab 12/14/17 1857  AST 16  ALT 19  ALKPHOS 65  BILITOT 0.4  PROT 7.4  ALBUMIN 3.8   No results for input(s): LIPASE, AMYLASE in the last 168 hours. No results for input(s): AMMONIA in the last 168 hours. CBC: Recent Labs  Lab 12/14/17 1857 12/14/17 1904  WBC 7.8  --   NEUTROABS 4.4  --   HGB 14.1 14.6  HCT 45.1 43.0  MCV 90.2  --   PLT 285  --    Cardiac Enzymes: No results for input(s): CKTOTAL, CKMB, CKMBINDEX, TROPONINI in the last 168 hours. BNP: Invalid input(s): POCBNP CBG: Recent Labs  Lab 12/14/17 2032 12/15/17 0005 12/15/17 0310 12/15/17 0604 12/15/17 1104  GLUCAP 125* 168* 168* 145* 264*   D-Dimer No results for input(s): DDIMER in the last 72 hours. Hgb A1c Recent Labs    12/15/17 0552  HGBA1C 7.3*   Lipid Profile Recent Labs    12/15/17 0552  CHOL 128  HDL 34*  LDLCALC 64  TRIG 150*  CHOLHDL 3.8   Thyroid function studies Recent Labs    12/15/17 0552  TSH 0.930   Anemia work up Recent Labs    12/15/17 0552  VITAMINB12 167*   Urinalysis    Component Value Date/Time   COLORURINE YELLOW 12/14/2017 1848   APPEARANCEUR CLEAR 12/14/2017 1848   LABSPEC 1.040 (H) 12/14/2017 1848   PHURINE 5.0 12/14/2017 1848   GLUCOSEU 150 (A) 12/14/2017 1848   HGBUR NEGATIVE 12/14/2017 1848   BILIRUBINUR NEGATIVE 12/14/2017 1848   BILIRUBINUR neg 12/10/2014 1435   [place] 12/14/2017 1848   PROTEINUR 30 (A) 12/14/2017 1848   UROBILINOGEN negative 12/10/2014 1435   UROBILINOGEN 0.2 12/09/2008 1006   NITRITE NEGATIVE 12/14/2017 1848   LEUKOCYTESUR NEGATIVE 12/14/2017 1848   Sepsis Labs Invalid input(s): PROCALCITONIN,  WBC,  LACTICIDVEN Microbiology No results found for this or any previous visit (from the past 240  hour(s)).   Time coordinating discharge: 35 minutes  SIGNED:   Rodena Goldmann, DO [facility] 12/15/2017, 12:24 PM Pager [phone]  If 7PM-7AM, please contact night-coverage [email or web address] Password [email or web address]

## 2017-12-15 NOTE — ED Notes (Signed)
Pt is resting comfortably with no pain

## 2017-12-16 ENCOUNTER — Telehealth: Payer: Self-pay | Admitting: *Deleted

## 2017-12-16 ENCOUNTER — Telehealth: Payer: Self-pay

## 2017-12-16 DIAGNOSIS — R4182 Altered mental status, unspecified: Secondary | ICD-10-CM

## 2017-12-16 LAB — FOLATE RBC
Folate, Hemolysate: 416.6 ng/mL
Folate, RBC: 997 ng/mL (ref >498)
Hematocrit: 41.8 % (ref 37.5–51.0)

## 2017-12-16 NOTE — Telephone Encounter (Signed)
Go ahead and put in the referral to gastroenterology, diagnosis headaches and altered mental status and history of TIA

## 2017-12-16 NOTE — Telephone Encounter (Signed)
Call Completed with patient's wife, Dione, and Appointment Scheduled: Yes, Date: 12/23/17 with Dr Dettinger   DISCHARGE INFORMATION Date of Discharge:12/15/17  Discharge Facility: [facility]  Principal Discharge Diagnosis: TIA  Patient and/or caregiver is knowledgeable of his/her condition(s) and treatment: Yes  MEDICATION RECONCILIATION Current medication list reviewed with patient/caregiver:Yes    Medication List    TAKE these medications   acetaminophen 650 MG CR tablet Commonly known as:  TYLENOL Take 1,300 mg by mouth 2 (two) times daily as needed for pain.   amLODipine 5 MG tablet Commonly known as:  NORVASC TAKE 1 TABLET BY MOUTH  DAILY What changed:  when to take this   aspirin EC 81 MG tablet Take 81 mg by mouth every evening.   aspirin-acetaminophen-caffeine 250-250-65 MG tablet Commonly known as:  EXCEDRIN MIGRAINE Take 1 tablet by mouth daily as needed (headaches). May resume 5 days post-op as needed What changed:    reasons to take this  additional instructions   atorvastatin 20 MG tablet Commonly known as:  LIPITOR TAKE 1 TABLET BY MOUTH  DAILY AT 6 PM. What changed:  See the new instructions.   baclofen 10 MG tablet Commonly known as:  LIORESAL Take 1 tablet (10 mg total) by mouth at bedtime as needed for muscle spasms.   BLUE-EMU MAXIMUM STRENGTH EX Apply 1 application topically daily as needed (muscle pain).   butalbital-acetaminophen-caffeine 50-325-40 MG tablet Commonly known as:  FIORICET, ESGIC Take 1 tablet by mouth 2 (two) times daily as needed for headache.   cetirizine 10 MG tablet Commonly known as:  ZYRTEC Take 10 mg by mouth every evening.   docusate sodium 100 MG capsule Commonly known as:  COLACE Take 1 capsule (100 mg total) by mouth 2 (two) times daily.   famotidine 20 MG tablet Commonly known as:  PEPCID TAKE 1 TABLET BY MOUTH TWO  TIMES DAILY   HUMALOG KWIKPEN 100 UNIT/ML KwikPen Generic drug:  insulin  lispro INJECT 0 TO 15 UNITS  SUBCUTANEOUSLY 3 TIMES  DAILY BEFORE MEALS  (70-150=8 UNITS, 151-200=9  UNITS) What changed:  See the new instructions.   HYDROcodone-acetaminophen 10-325 MG tablet Commonly known as:  NORCO Take 1 tablet by mouth every 4 (four) hours as needed for severe pain.   Insulin Pen Needle 31G X 8 MM Misc USE 4 TIMES DAILY DX E11.49   LANTUS SOLOSTAR 100 UNIT/ML Solostar Pen Generic drug:  Insulin Glargine INJECT SUBCUTANEOUSLY 52  UNITS AT BEDTIME What changed:  See the new instructions.   losartan 50 MG tablet Commonly known as:  COZAAR TAKE 1 TABLET BY MOUTH  DAILY What changed:  when to take this   metFORMIN 500 MG 24 hr tablet Commonly known as:  GLUCOPHAGE-XR TAKE 2 TABLETS BY MOUTH TWO TIMES DAILY What changed:  See the new instructions.   nitroGLYCERIN 0.4 MG SL tablet Commonly known as:  NITROSTAT DISSOLVE ONE TABLET UNDER THE TONGUE EVERY 5 MINUTES AS NEEDED FOR CHEST PAIN.  DO NOT EXCEED A TOTAL OF 3 DOSES IN 15 MINUTES   ONE TOUCH ULTRA TEST test strip Generic drug:  glucose blood USE 1 STRIP TO CHECK GLUCOSE 4 TIMES DAILY   PARoxetine 20 MG tablet Commonly known as:  PAXIL TAKE 1 TABLET BY MOUTH  DAILY What changed:  when to take this   polyethylene glycol packet Commonly known as:  MIRALAX / GLYCOLAX Take 17 g by mouth daily. What changed:    when to take this  reasons to take  this   tamsulosin 0.4 MG Caps capsule Commonly known as:  FLOMAX TAKE 1 CAPSULE BY MOUTH  DAILY What changed:  when to take this   Vitamin D3 50 MCG (2000 UT) Tabs Take 2,000 Units by mouth every morning.        Discharge Medications reviewed and reconciled with current medications.yes  Patient is able to obtain needed medications:Yes  ACTIVITIES OF DAILY LIVING  Is the patient able to perform his/her own ADLs: Yes.    Patient is receiving home health services: No.  PATIENT EDUCATION Questions/Concerns Discussed:  Talked with  wife about follow up needs and results from hospitalization. She has contacted [facility], where he is an established patient, to schedule an appointment for hospital f/u. He has an upcoming appointment with Cardiology, Dr Domenic Polite, in March 2020. Discharge summary recommended that he consider an echo at that time.   Wife aware to f/u with our office sooner if necessary or to have him seek emergency care if needed.   Chong Sicilian, RN-BC, BSN Nurse Case Manager [facility] Ph: [phone]

## 2017-12-16 NOTE — Telephone Encounter (Signed)
Referral placed and pt's wife is aware.

## 2017-12-16 NOTE — Telephone Encounter (Signed)
Was in [facility] recently and they said to F/U 6 weeks with Neurology  Needs a referral to [facility] where he is already a patient

## 2017-12-18 ENCOUNTER — Emergency Department (HOSPITAL_COMMUNITY): Payer: Medicare Other

## 2017-12-18 ENCOUNTER — Other Ambulatory Visit: Payer: Self-pay

## 2017-12-18 ENCOUNTER — Inpatient Hospital Stay (HOSPITAL_COMMUNITY)
Admission: EM | Admit: 2017-12-18 | Discharge: 2017-12-23 | DRG: 070 | Disposition: A | Discharge status: Skilled Nursing Facility | Payer: Medicare Other | Attending: Internal Medicine | Admitting: Internal Medicine

## 2017-12-18 ENCOUNTER — Encounter (HOSPITAL_COMMUNITY): Payer: Self-pay | Admitting: Emergency Medicine

## 2017-12-18 DIAGNOSIS — G9340 Encephalopathy, unspecified: Secondary | ICD-10-CM

## 2017-12-18 DIAGNOSIS — Z800 Family history of malignant neoplasm of digestive organs: Secondary | ICD-10-CM

## 2017-12-18 DIAGNOSIS — Z96653 Presence of artificial knee joint, bilateral: Secondary | ICD-10-CM | POA: Diagnosis present

## 2017-12-18 DIAGNOSIS — Z885 Allergy status to narcotic agent status: Secondary | ICD-10-CM

## 2017-12-18 DIAGNOSIS — R4701 Aphasia: Secondary | ICD-10-CM | POA: Diagnosis present

## 2017-12-18 DIAGNOSIS — F0390 Unspecified dementia without behavioral disturbance: Secondary | ICD-10-CM | POA: Diagnosis present

## 2017-12-18 DIAGNOSIS — Z6835 Body mass index (BMI) 35.0-35.9, adult: Secondary | ICD-10-CM

## 2017-12-18 DIAGNOSIS — Z955 Presence of coronary angioplasty implant and graft: Secondary | ICD-10-CM | POA: Diagnosis not present

## 2017-12-18 DIAGNOSIS — Z7982 Long term (current) use of aspirin: Secondary | ICD-10-CM

## 2017-12-18 DIAGNOSIS — Z96641 Presence of right artificial hip joint: Secondary | ICD-10-CM | POA: Diagnosis present

## 2017-12-18 DIAGNOSIS — I700 Atherosclerosis of aorta: Secondary | ICD-10-CM

## 2017-12-18 DIAGNOSIS — G9341 Metabolic encephalopathy: Secondary | ICD-10-CM

## 2017-12-18 DIAGNOSIS — I10 Essential (primary) hypertension: Secondary | ICD-10-CM | POA: Diagnosis present

## 2017-12-18 DIAGNOSIS — Z9841 Cataract extraction status, right eye: Secondary | ICD-10-CM

## 2017-12-18 DIAGNOSIS — N401 Enlarged prostate with lower urinary tract symptoms: Secondary | ICD-10-CM | POA: Diagnosis not present

## 2017-12-18 DIAGNOSIS — F05 Delirium due to known physiological condition: Secondary | ICD-10-CM | POA: Diagnosis present

## 2017-12-18 DIAGNOSIS — E538 Deficiency of other specified B group vitamins: Secondary | ICD-10-CM | POA: Diagnosis present

## 2017-12-18 DIAGNOSIS — I252 Old myocardial infarction: Secondary | ICD-10-CM

## 2017-12-18 DIAGNOSIS — D72820 Lymphocytosis (symptomatic): Secondary | ICD-10-CM | POA: Diagnosis not present

## 2017-12-18 DIAGNOSIS — E785 Hyperlipidemia, unspecified: Secondary | ICD-10-CM | POA: Diagnosis present

## 2017-12-18 DIAGNOSIS — K219 Gastro-esophageal reflux disease without esophagitis: Secondary | ICD-10-CM | POA: Diagnosis present

## 2017-12-18 DIAGNOSIS — Z794 Long term (current) use of insulin: Secondary | ICD-10-CM

## 2017-12-18 DIAGNOSIS — Z881 Allergy status to other antibiotic agents status: Secondary | ICD-10-CM

## 2017-12-18 DIAGNOSIS — E669 Obesity, unspecified: Secondary | ICD-10-CM | POA: Diagnosis present

## 2017-12-18 DIAGNOSIS — R509 Fever, unspecified: Secondary | ICD-10-CM | POA: Diagnosis not present

## 2017-12-18 DIAGNOSIS — H9191 Unspecified hearing loss, right ear: Secondary | ICD-10-CM | POA: Diagnosis present

## 2017-12-18 DIAGNOSIS — E1159 Type 2 diabetes mellitus with other circulatory complications: Secondary | ICD-10-CM | POA: Diagnosis present

## 2017-12-18 DIAGNOSIS — I2510 Atherosclerotic heart disease of native coronary artery without angina pectoris: Secondary | ICD-10-CM | POA: Diagnosis present

## 2017-12-18 DIAGNOSIS — R42 Dizziness and giddiness: Secondary | ICD-10-CM | POA: Diagnosis not present

## 2017-12-18 DIAGNOSIS — Z87891 Personal history of nicotine dependence: Secondary | ICD-10-CM

## 2017-12-18 DIAGNOSIS — R4182 Altered mental status, unspecified: Secondary | ICD-10-CM | POA: Diagnosis present

## 2017-12-18 DIAGNOSIS — J9601 Acute respiratory failure with hypoxia: Secondary | ICD-10-CM | POA: Diagnosis not present

## 2017-12-18 DIAGNOSIS — Z87442 Personal history of urinary calculi: Secondary | ICD-10-CM | POA: Diagnosis not present

## 2017-12-18 DIAGNOSIS — Z8249 Family history of ischemic heart disease and other diseases of the circulatory system: Secondary | ICD-10-CM

## 2017-12-18 DIAGNOSIS — Z86010 Personal history of colonic polyps: Secondary | ICD-10-CM

## 2017-12-18 DIAGNOSIS — R351 Nocturia: Secondary | ICD-10-CM | POA: Diagnosis not present

## 2017-12-18 DIAGNOSIS — I712 Thoracic aortic aneurysm, without rupture: Secondary | ICD-10-CM | POA: Diagnosis present

## 2017-12-18 DIAGNOSIS — Z961 Presence of intraocular lens: Secondary | ICD-10-CM | POA: Diagnosis present

## 2017-12-18 DIAGNOSIS — M7070 Other bursitis of hip, unspecified hip: Secondary | ICD-10-CM | POA: Diagnosis present

## 2017-12-18 DIAGNOSIS — G4733 Obstructive sleep apnea (adult) (pediatric): Secondary | ICD-10-CM | POA: Diagnosis present

## 2017-12-18 DIAGNOSIS — Z803 Family history of malignant neoplasm of breast: Secondary | ICD-10-CM

## 2017-12-18 DIAGNOSIS — Z808 Family history of malignant neoplasm of other organs or systems: Secondary | ICD-10-CM

## 2017-12-18 DIAGNOSIS — N400 Enlarged prostate without lower urinary tract symptoms: Secondary | ICD-10-CM | POA: Diagnosis present

## 2017-12-18 DIAGNOSIS — R404 Transient alteration of awareness: Secondary | ICD-10-CM | POA: Diagnosis not present

## 2017-12-18 DIAGNOSIS — G030 Nonpyogenic meningitis: Secondary | ICD-10-CM | POA: Diagnosis not present

## 2017-12-18 DIAGNOSIS — R410 Disorientation, unspecified: Secondary | ICD-10-CM | POA: Diagnosis not present

## 2017-12-18 DIAGNOSIS — E11649 Type 2 diabetes mellitus with hypoglycemia without coma: Secondary | ICD-10-CM | POA: Diagnosis present

## 2017-12-18 DIAGNOSIS — J439 Emphysema, unspecified: Secondary | ICD-10-CM | POA: Diagnosis not present

## 2017-12-18 DIAGNOSIS — Z8052 Family history of malignant neoplasm of bladder: Secondary | ICD-10-CM

## 2017-12-18 DIAGNOSIS — Z8673 Personal history of transient ischemic attack (TIA), and cerebral infarction without residual deficits: Secondary | ICD-10-CM | POA: Diagnosis not present

## 2017-12-18 DIAGNOSIS — Z888 Allergy status to other drugs, medicaments and biological substances status: Secondary | ICD-10-CM

## 2017-12-18 HISTORY — DX: Low back pain: M54.5

## 2017-12-18 HISTORY — DX: Other bursitis of hip, unspecified hip: M70.70

## 2017-12-18 HISTORY — DX: Spondylosis without myelopathy or radiculopathy, cervical region: M47.812

## 2017-12-18 HISTORY — DX: Obstructive sleep apnea (adult) (pediatric): G47.33

## 2017-12-18 HISTORY — DX: Low back pain, unspecified: M54.50

## 2017-12-18 HISTORY — DX: Atherosclerosis of aorta: I70.0

## 2017-12-18 LAB — COMPREHENSIVE METABOLIC PANEL
ALT: 23 U/L (ref 0–44)
AST: 23 U/L (ref 15–41)
Albumin: 4 g/dL (ref 3.5–5.0)
Alkaline Phosphatase: 64 U/L (ref 38–126)
Anion gap: 9 (ref 5–15)
BUN: 15 mg/dL (ref 8–23)
CO2: 25 mmol/L (ref 22–32)
CREATININE: 0.94 mg/dL (ref 0.61–1.24)
Calcium: 8.8 mg/dL — ABNORMAL LOW (ref 8.9–10.3)
Chloride: 101 mmol/L (ref 98–111)
Glucose, Bld: 112 mg/dL — ABNORMAL HIGH (ref 70–99)
Potassium: 3.6 mmol/L (ref 3.5–5.1)
Sodium: 135 mmol/L (ref 135–145)
Total Bilirubin: 1 mg/dL (ref 0.3–1.2)
Total Protein: 7.7 g/dL (ref 6.5–8.1)

## 2017-12-18 LAB — BLOOD GAS, VENOUS
ACID-BASE EXCESS: 3.4 mmol/L — AB (ref 0.0–2.0)
Bicarbonate: 26.4 mmol/L (ref 20.0–28.0)
FIO2: 21
O2 Saturation: 74.2 %
pCO2, Ven: 44.9 mmHg (ref 44.0–60.0)
pH, Ven: 7.407 (ref 7.250–7.430)
pO2, Ven: 40.3 mmHg (ref 32.0–45.0)

## 2017-12-18 LAB — CBG MONITORING, ED
Glucose-Capillary: 143 mg/dL — ABNORMAL HIGH (ref 70–99)
Glucose-Capillary: 150 mg/dL — ABNORMAL HIGH (ref 70–99)

## 2017-12-18 LAB — DIFFERENTIAL
Abs Immature Granulocytes: 0.03 10*3/uL (ref 0.00–0.07)
Basophils Absolute: 0 10*3/uL (ref 0.0–0.1)
Basophils Relative: 1 %
Eosinophils Absolute: 0.2 10*3/uL (ref 0.0–0.5)
Eosinophils Relative: 2 %
IMMATURE GRANULOCYTES: 0 %
LYMPHS ABS: 1.7 10*3/uL (ref 0.7–4.0)
Lymphocytes Relative: 21 %
Monocytes Absolute: 0.9 10*3/uL (ref 0.1–1.0)
Monocytes Relative: 11 %
Neutro Abs: 5.2 10*3/uL (ref 1.7–7.7)
Neutrophils Relative %: 65 %

## 2017-12-18 LAB — CBC
HCT: 49 % (ref 39.0–52.0)
Hemoglobin: 15.5 g/dL (ref 13.0–17.0)
MCH: 28.4 pg (ref 26.0–34.0)
MCHC: 31.6 g/dL (ref 30.0–36.0)
MCV: 89.9 fL (ref 80.0–100.0)
Platelets: 287 10*3/uL (ref 150–400)
RBC: 5.45 MIL/uL (ref 4.22–5.81)
RDW: 13.6 % (ref 11.5–15.5)
WBC: 8 10*3/uL (ref 4.0–10.5)
nRBC: 0 % (ref 0.0–0.2)

## 2017-12-18 LAB — RAPID URINE DRUG SCREEN, HOSP PERFORMED
Amphetamines: NOT DETECTED
Barbiturates: NOT DETECTED
Benzodiazepines: NOT DETECTED
COCAINE: NOT DETECTED
Opiates: POSITIVE — AB
Tetrahydrocannabinol: NOT DETECTED

## 2017-12-18 LAB — ACETAMINOPHEN LEVEL: Acetaminophen (Tylenol), Serum: <10 ug/mL — ABNORMAL LOW (ref 10–30)

## 2017-12-18 LAB — URINALYSIS, ROUTINE W REFLEX MICROSCOPIC
Bacteria, UA: NONE SEEN
Bilirubin Urine: NEGATIVE
Glucose, UA: NEGATIVE mg/dL
Ketones, ur: NEGATIVE mg/dL
LEUKOCYTES UA: NEGATIVE
Nitrite: NEGATIVE
PH: 5 (ref 5.0–8.0)
Protein, ur: 30 mg/dL — AB
Specific Gravity, Urine: 1.018 (ref 1.005–1.030)

## 2017-12-18 LAB — PROTIME-INR
INR: 0.99
Prothrombin Time: 13 seconds (ref 11.4–15.2)

## 2017-12-18 LAB — APTT: aPTT: 25 seconds (ref 24–36)

## 2017-12-18 LAB — SALICYLATE LEVEL: Salicylate Lvl: <7 mg/dL (ref 2.8–30.0)

## 2017-12-18 LAB — MAGNESIUM: MAGNESIUM: 1.9 mg/dL (ref 1.7–2.4)

## 2017-12-18 LAB — INFLUENZA PANEL BY PCR (TYPE A & B)
Influenza A By PCR: NEGATIVE
Influenza B By PCR: NEGATIVE

## 2017-12-18 LAB — ETHANOL: Alcohol, Ethyl (B): <10 mg/dL (ref <10)

## 2017-12-18 LAB — TROPONIN I: Troponin I: <0.03 ng/mL (ref <0.03)

## 2017-12-18 LAB — AMMONIA: Ammonia: 14 umol/L (ref 9–35)

## 2017-12-18 MED ORDER — MORPHINE SULFATE (PF) 4 MG/ML IV SOLN
4.0000 mg | Freq: Once | INTRAVENOUS | Status: AC
  Administered 2017-12-18: 4 mg via INTRAVENOUS
  Filled 2017-12-18: qty 1

## 2017-12-18 MED ORDER — VANCOMYCIN HCL IN DEXTROSE 1-5 GM/200ML-% IV SOLN: 1000.0000 mg | Freq: Once | INTRAVENOUS | Status: DC

## 2017-12-18 MED ORDER — LORAZEPAM 1 MG PO TABS
1.0000 mg | ORAL_TABLET | Freq: Once | ORAL | Status: AC
  Administered 2017-12-18: 1 mg via ORAL
  Filled 2017-12-18: qty 1

## 2017-12-18 MED ORDER — SODIUM CHLORIDE 0.9 % IV SOLN
2.0000 g | Freq: Once | INTRAVENOUS | Status: AC
  Administered 2017-12-18: 2 g via INTRAVENOUS
  Filled 2017-12-18: qty 20

## 2017-12-18 MED ORDER — SODIUM CHLORIDE 0.9 % IV SOLN
2.0000 g | INTRAVENOUS | Status: DC
  Filled 2017-12-18 (×2): qty 20

## 2017-12-18 MED ORDER — LABETALOL HCL 5 MG/ML IV SOLN
10.0000 mg | INTRAVENOUS | Status: DC | PRN
  Administered 2017-12-19 – 2017-12-21 (×3): 10 mg via INTRAVENOUS
  Filled 2017-12-18 (×3): qty 4

## 2017-12-18 MED ORDER — SODIUM CHLORIDE 0.9 % IV SOLN
2.0000 g | INTRAVENOUS | Status: DC
  Administered 2017-12-19 – 2017-12-21 (×13): 2 g via INTRAVENOUS
  Filled 2017-12-18: qty 2
  Filled 2017-12-18 (×10): qty 2000
  Filled 2017-12-18: qty 2
  Filled 2017-12-18 (×6): qty 2000
  Filled 2017-12-18: qty 2
  Filled 2017-12-18 (×4): qty 2000

## 2017-12-18 MED ORDER — MORPHINE SULFATE (PF) 2 MG/ML IV SOLN
2.0000 mg | Freq: Once | INTRAVENOUS | Status: AC
  Administered 2017-12-18: 2 mg via INTRAVENOUS
  Filled 2017-12-18: qty 1

## 2017-12-18 MED ORDER — DEXTROSE 5 % IV SOLN
10.0000 mg/kg | Freq: Three times a day (TID) | INTRAVENOUS | Status: DC
  Administered 2017-12-19 – 2017-12-22 (×9): 640 mg via INTRAVENOUS
  Filled 2017-12-18 (×9): qty 12.8
  Filled 2017-12-18: qty 10
  Filled 2017-12-18 (×5): qty 12.8

## 2017-12-18 MED ORDER — SODIUM CHLORIDE 0.9 % IV SOLN
2.0000 g | Freq: Once | INTRAVENOUS | Status: AC
  Administered 2017-12-18: 2 g via INTRAVENOUS
  Filled 2017-12-18: qty 2000

## 2017-12-18 MED ORDER — INSULIN ASPART 100 UNIT/ML ~~LOC~~ SOLN
0.0000 [IU] | SUBCUTANEOUS | Status: DC
  Administered 2017-12-19: 2 [IU] via SUBCUTANEOUS
  Administered 2017-12-19 (×2): 3 [IU] via SUBCUTANEOUS
  Administered 2017-12-20 (×2): 2 [IU] via SUBCUTANEOUS
  Administered 2017-12-20 (×2): 3 [IU] via SUBCUTANEOUS

## 2017-12-18 MED ORDER — VANCOMYCIN HCL 10 G IV SOLR
1250.0000 mg | Freq: Two times a day (BID) | INTRAVENOUS | Status: DC
  Administered 2017-12-19 – 2017-12-20 (×4): 1250 mg via INTRAVENOUS
  Filled 2017-12-18 (×7): qty 1250

## 2017-12-18 MED ORDER — PROMETHAZINE HCL 25 MG/ML IJ SOLN
12.5000 mg | Freq: Once | INTRAMUSCULAR | Status: AC
  Administered 2017-12-18: 12.5 mg via INTRAVENOUS
  Filled 2017-12-18: qty 1

## 2017-12-18 MED ORDER — VANCOMYCIN HCL IN DEXTROSE 1-5 GM/200ML-% IV SOLN
1000.0000 mg | Freq: Once | INTRAVENOUS | Status: AC
  Administered 2017-12-19: 1000 mg via INTRAVENOUS
  Filled 2017-12-18: qty 200

## 2017-12-18 MED ORDER — CYANOCOBALAMIN 1000 MCG/ML IJ SOLN
1000.0000 ug | Freq: Once | INTRAMUSCULAR | Status: AC
  Administered 2017-12-19: 1000 ug via INTRAMUSCULAR
  Filled 2017-12-18 (×2): qty 1

## 2017-12-18 MED ORDER — POTASSIUM CHLORIDE IN NACL 20-0.9 MEQ/L-% IV SOLN
INTRAVENOUS | Status: AC
  Administered 2017-12-19: 01:00:00 via INTRAVENOUS

## 2017-12-18 MED ORDER — PROMETHAZINE HCL 25 MG/ML IJ SOLN
12.5000 mg | Freq: Four times a day (QID) | INTRAMUSCULAR | Status: DC | PRN
  Administered 2017-12-19: 12.5 mg via INTRAVENOUS
  Filled 2017-12-18: qty 1

## 2017-12-18 MED ORDER — ACETAMINOPHEN 500 MG PO TABS
1000.0000 mg | ORAL_TABLET | Freq: Once | ORAL | Status: AC
  Administered 2017-12-18: 1000 mg via ORAL
  Filled 2017-12-18: qty 2

## 2017-12-18 MED ORDER — IOPAMIDOL (ISOVUE-300) INJECTION 61%
75.0000 mL | Freq: Once | INTRAVENOUS | Status: AC | PRN
  Administered 2017-12-18: 75 mL via INTRAVENOUS

## 2017-12-18 MED ORDER — SODIUM CHLORIDE 0.9 % IV SOLN
INTRAVENOUS | Status: DC
  Administered 2017-12-18: 18:00:00 via INTRAVENOUS

## 2017-12-18 MED ORDER — ONDANSETRON HCL 4 MG/2ML IJ SOLN
4.0000 mg | Freq: Once | INTRAMUSCULAR | Status: AC
  Administered 2017-12-18: 4 mg via INTRAVENOUS
  Filled 2017-12-18: qty 2

## 2017-12-18 NOTE — Consult Note (Signed)
   [facility] Tele[facility]urology [facility]  This is a STAT consultation for persistent encephalopathy  Impression:  Persistent encephalopathy question underlying cognitive impairment     -DDx toxic metabolic encephalopathy with underlying low B12; encephalitis; seizures; stroke/TIA  Episodes of aphasia and gait impairment per wife - rule out TIA/stroke/subclinical seizures Low grade fevers B12 deficiency  Recommendations:  MRI brain w wo contrast when able - rule out interval stroke given persistent AMS/aphasia, seizure focus or encephalitis Routine EEG for tomorrow Consider LP esp. If he spikes  A fever and/or workup otherwise negative.  HSV encephalitis should be in differential although initial MRI was negative. Neuro checks Safety precautions. PT/OT/SLP evals  Recommend B12 supplementation. Check other labs including ammonia, Mg, etc that can exacerbate delirium.   ---------------------------------------------------------------------  CC: confusion  History of Present Illness:  77 yo M hx CAD, DM2, HTN, TIA who has been having waxing and waning delirium since earlier this week 12/4.  On Wednesday he was brought in while eating dinner.  He was confused having difficulty understanding and speaking.  He also apparently had a facial droop and was worked up for a TIA/stroke. MRI brain was negative for stroke or mass.  In the evenings it seems to get worse , but better int he day. Then Saturday AM, feeling fine. However since early this morning after 0300 he was hollering obscenities denied pain but then took Excedrin for headache.  He was not walking straight, couldn't eat.  He was also speaking gibberish and now not speaking much at all.  His workup was positive for low B12 but she denies any hstory of supplementation.  He has not gotten an LP due to low suspicion of encephalitis given TIA like presentation earlier on.   Exam:  Mental Status:  Awake, alert, oriented to name,  but not following commands consistently Repetition: impaired Speech: nonfuent  Cranial Nerves:  Pupils: Equal round and reactive to light Extraocular movements: Gaze intact Ptosis: Absent Facial movements: Intact and symmetric    Motor Exam:  No drift in arms, able to move himself around bed but not to command   Tremor/Abnormal Movements:  Resting tremor: Absent Postural tremor: Absent  Sensory Exam:   Respnds to noxious stim x4    Coordination:   Unable to follow commands  Medical Decision Making:  - Extensive number of diagnosis or management options are considered above.   - Extensive amount of complex data reviewed.   - High risk of complication and/or morbidity or mortality are associated with differential diagnostic considerations above.  - There may be uncertain outcome and increased probability of prolonged functional impairment or high probability of severe prolonged functional impairment associated with some of these differential diagnosis.   Medical Data Reviewed:  1.Data reviewed include clinical labs, radiology,  Medical Tests;   2.Tests results discussed w/performing or interpreting physician;   3.Obtaining/reviewing old medical records;  4.Obtaining case history from another source;  5.Independent review of image, tracing or specimen.    Patient was informed the Neurology Consult would happen via telehealth (remote video) and consented to receiving care in this manner.

## 2017-12-18 NOTE — ED Notes (Signed)
Pt continuously removing gown. Pt placed in blue paper scrub pants since curtain must remain open due to AMS.

## 2017-12-18 NOTE — ED Notes (Signed)
Neurologist sent to [facility] at [phone].

## 2017-12-18 NOTE — ED Notes (Signed)
Received report on pt, pt lying on right side, resp even and non labored with eyes closed, pt will arouse when spoken to, slurred speech, unable to answer questions when asked, comfort measures provided, will follow commands after several attempts of being asked,

## 2017-12-18 NOTE — ED Provider Notes (Signed)
[facility] EMERGENCY DEPARTMENT Provider Note   CSN: [id number] Arrival date & time: 12/18/17  1039     History   Chief Complaint Chief Complaint  Patient presents with  . Altered Mental Status    HPI [name] is a 77 y.o. male.  Patient is a 77 year old male who presents to the emergency department by EMS and with his wife because of altered mental status.  Patient has a history of altered mental status, transient ischemic attack, diabetes mellitus, coronary artery disease, hypertension, and benign prostate hypertrophy.  Patient's wife is the primary historian.  The patient was seen in the emergency department on December 4.  At that time the patient seemed to be confused and having difficulty expressing himself.  At that time his wife asked him questions and he could not get seem to get the words out.  The patient had a work-up and was admitted to the hospital.  The wife states that he was doing fine until yesterday December 7 approximately 6 PM.  The same patient seemed to be confused again.  He seemed to have had problems chewing up and swallowing his food.  She is a Marine scientist, and checked his vital signs.  He had some low-grade temperature changes, but she did not find any gross neurologic abnormality at the time.  Temperature maximum was 101.  This was treated with Excedrin.  Plan they are retiring to go to bed later that night the confusion got worse, and continued into this morning.  The patient's wife says that he is just not himself.  She says that he seems to have problems completing task.  She says is as if he knows what he wants to do, but cannot make his arms or legs do what he needs them to do.  She has not noticed any slurred speech, no unusual weakness, he complains of a mild headache, but no other problems or changes.  There is been no vision changes to be reported.  No recent changes in medication.  Patient is brought to the emergency department at this time for  additional evaluation concerning these changes.  The history is provided by the spouse.  Altered Mental Status   Associated symptoms include confusion. Pertinent negatives include no seizures, no weakness and no hallucinations.    Past Medical History:  Diagnosis Date  . Anxiety disorder   . Arthritis   . Bursitis of hip   . Cervical spondylosis   . Chronic headaches   . Coronary atherosclerosis of native coronary artery    BMS nondominant RCA 12/2004  . Essential hypertension, benign   . GERD (gastroesophageal reflux disease)   . History of adenomatous polyp of colon   . History of kidney stones   . History of MI (myocardial infarction) 12/2004   s/p  BMS to RCA  . Hyperlipidemia   . Internal hemorrhoids   . Low back pain   . Nocturia more than twice per night   . Numbness of left foot   . OSA (obstructive sleep apnea)   . Recurrent headache   . S/p bare metal coronary artery stent 12/2004   BMS x1 to RCA  . Spinal stenosis   . Type 2 diabetes mellitus treated with insulin ([place])   . Urgency of urination   . Wears dentures    upper    Patient Active Problem List   Diagnosis Date Noted  . Word finding difficulty 12/14/2017  . BPH (benign prostatic hyperplasia) 07/29/2017  .  Lumbar spinal stenosis 02/25/2017  . Spinal stenosis of lumbar region 02/24/2017  . Spinal stenosis at L4-L5 level 02/24/2017  . Lumbar radicular pain 01/17/2017  . Lumbar pain 01/17/2017  . Microalbuminuria due to type 2 diabetes mellitus ([place]) 07/23/2016  . Common migraine 12/23/2014  . Hx of adenomatous colonic polyps 05/09/2014  . Obesity (BMI 30-39.9) 07/30/2013  . Type 2 diabetes mellitus with circulatory disorder ([place]) 05/27/2011  . Hyperlipidemia associated with type 2 diabetes mellitus ([place]) 10/19/2008  . Hypertension associated with diabetes ([place]) 10/19/2008  . CORONARY ATHEROSCLEROSIS NATIVE CORONARY ARTERY 10/17/2008  . GERD (gastroesophageal reflux disease) 12/27/2007    Past  Surgical History:  Procedure Laterality Date  . CARDIAC CATHETERIZATION  06-14-2004  dr Lia Foyer   total occlusion mD1, RI 30-40%, mRCA nondominant hazy 80% and scattered 30-40%,  ef 71% (medical mangement)  . CARDIOVASCULAR STRESS TEST  09-17-2015   dr Domenic Polite   Low risk nuclear study w/ reversible mild small anteroapical defect,  normal LV function and wall motion, nuclear stress ef 61%  . CATARACT EXTRACTION W/ INTRAOCULAR LENS IMPLANT Right 07/2014  . COLONOSCOPY  09/10/2008   normal  . CORONARY ANGIOPLASTY WITH STENT PLACEMENT  12-17-2004   dr benismhon/ dr Olevia Perches   nonobstructive cad LAD and LCFx,  BMS x1 to total occlusion RCA   . CYSTOSCOPY W/ URETEROSCOPY W/ LITHOTRIPSY  08/2010  . ERCP  03/14/2008  . LAPAROSCOPIC CHOLECYSTECTOMY  05/2012  . LEFT HEART CATHETERIZATION WITH CORONARY ANGIOGRAM N/A 05/14/2011   Procedure: LEFT HEART CATHETERIZATION WITH CORONARY ANGIOGRAM;  Surgeon: Sherren Mocha, MD;  Location: [facility] CATH LAB;  Service: Cardiovascular;  Laterality: N/A;    non-obstructive LM, LAD, LCFx, patent RCA stent  . LUMBAR LAMINECTOMY/DECOMPRESSION MICRODISCECTOMY N/A 02/24/2017   Procedure: Microlumbar decompression L4-5, L5-S1;  Surgeon: Susa Day, MD;  Location: [facility] ORS;  Service: Orthopedics;  Laterality: N/A;  120 mins  . ROTATOR CUFF REPAIR Right 03/2002  . ROTATOR CUFF REPAIR Left 12/11/2015  . TOTAL HIP ARTHROPLASTY Right 12/20/2008  . TOTAL KNEE ARTHROPLASTY Bilateral left 12-11-2003/  right 07-31-2004   dr Wynelle Link [facility]  . UPPER GASTROINTESTINAL ENDOSCOPY  01/08/2008   bx, inlet patch, duodenitis  . YAG LASER APPLICATION Right 7/67/2094   Procedure: YAG LASER APPLICATION;  Surgeon: Williams Che, MD;  Location: [facility] ORS;  Service: Ophthalmology;  Laterality: Right;        Home Medications    Prior to Admission medications   Medication Sig Start Date End Date Taking? Authorizing Provider  acetaminophen (TYLENOL) 650 MG CR tablet Take 1,300 mg by mouth 2 (two)  times daily as needed for pain.     [provider]  amLODipine (NORVASC) 5 MG tablet TAKE 1 TABLET BY MOUTH  DAILY Patient taking differently: Take 5 mg by mouth every morning.  11/30/17   Dettinger, Fransisca Kaufmann, MD  aspirin EC 81 MG tablet Take 81 mg by mouth every evening.    [provider]  aspirin-acetaminophen-caffeine (EXCEDRIN EXTRA STRENGTH) [phone] MG tablet Take 1 tablet by mouth daily as needed (headaches). May resume 5 days post-op as needed Patient taking differently: Take 1 tablet by mouth daily as needed for headache.  02/25/17   Cecilie Kicks, PA-C  atorvastatin (LIPITOR) 20 MG tablet TAKE 1 TABLET BY MOUTH  DAILY AT 6 PM. Patient taking differently: Take 20 mg by mouth every evening.  11/30/17   Dettinger, Fransisca Kaufmann, MD  baclofen (LIORESAL) 10 MG tablet Take 1 tablet (10 mg total) by  mouth at bedtime as needed for muscle spasms. 06/27/17   Dettinger, Fransisca Kaufmann, MD  butalbital-acetaminophen-caffeine (FIORICET, ESGIC) [phone] MG tablet Take 1 tablet by mouth 2 (two) times daily as needed for headache. 01/17/17   Terald Sleeper, PA-C  cetirizine (ZYRTEC) 10 MG tablet Take 10 mg by mouth every evening.     [provider]  Cholecalciferol (VITAMIN D3) 2000 UNITS TABS Take 2,000 Units by mouth every morning.     [provider]  docusate sodium (COLACE) 100 MG capsule Take 1 capsule (100 mg total) by mouth 2 (two) times daily. 02/24/17 02/24/18  Susa Day, MD  famotidine (PEPCID) 20 MG tablet TAKE 1 TABLET BY MOUTH TWO  TIMES DAILY Patient taking differently: Take 20 mg by mouth 2 (two) times daily.  11/30/17   Dettinger, Fransisca Kaufmann, MD  HUMALOG KWIKPEN 100 UNIT/ML KwikPen INJECT 0 TO 15 UNITS  SUBCUTANEOUSLY 3 TIMES  DAILY BEFORE MEALS  (70-150=8 UNITS, 151-200=9  UNITS) Patient taking differently: Inject 0-10 Units into the skin 3 (three) times daily before meals.  11/30/17   Dettinger, Fransisca Kaufmann, MD  HYDROcodone-acetaminophen (NORCO) 10-325 MG  tablet Take 1 tablet by mouth every 4 (four) hours as needed for severe pain. 02/24/17   Susa Day, MD  Insulin Pen Needle (B-D ULTRAFINE III SHORT PEN) 31G X 8 MM MISC USE 4 TIMES DAILY DX E11.49 11/18/14   Wardell Honour, MD  LANTUS SOLOSTAR 100 UNIT/ML Solostar Pen INJECT SUBCUTANEOUSLY 52  UNITS AT BEDTIME Patient taking differently: Inject 52 Units into the skin at bedtime.  07/11/17   Dettinger, Fransisca Kaufmann, MD  losartan (COZAAR) 50 MG tablet TAKE 1 TABLET BY MOUTH  DAILY Patient taking differently: Take 50 mg by mouth every morning.  11/30/17   Dettinger, Fransisca Kaufmann, MD  Menthol, Topical Analgesic, (BLUE-EMU MAXIMUM STRENGTH EX) Apply 1 application topically daily as needed (muscle pain).    [provider]  metFORMIN (GLUCOPHAGE-XR) 500 MG 24 hr tablet TAKE 2 TABLETS BY MOUTH TWO TIMES DAILY Patient taking differently: Take 1,000 mg by mouth 2 (two) times daily.  11/30/17   Dettinger, Fransisca Kaufmann, MD  nitroGLYCERIN (NITROSTAT) 0.4 MG SL tablet DISSOLVE ONE TABLET UNDER THE TONGUE EVERY 5 MINUTES AS NEEDED FOR CHEST PAIN.  DO NOT EXCEED A TOTAL OF 3 DOSES IN 15 MINUTES 02/03/17   Satira Sark, MD  ONE TOUCH ULTRA TEST test strip USE 1 STRIP TO CHECK GLUCOSE 4 TIMES DAILY 09/05/17   Dettinger, Fransisca Kaufmann, MD  PARoxetine (PAXIL) 20 MG tablet TAKE 1 TABLET BY MOUTH  DAILY Patient taking differently: Take 20 mg by mouth every evening.  11/30/17   Dettinger, Fransisca Kaufmann, MD  polyethylene glycol (MIRALAX / GLYCOLAX) packet Take 17 g by mouth daily. Patient taking differently: Take 17 g by mouth daily as needed for mild constipation.  02/24/17   Susa Day, MD  tamsulosin (FLOMAX) 0.4 MG CAPS capsule TAKE 1 CAPSULE BY MOUTH  DAILY Patient taking differently: Take 0.4 mg by mouth every evening.  11/30/17   Dettinger, Fransisca Kaufmann, MD    Family History Family History  Problem Relation Age of Onset  . Colon cancer Mother        Diagnosed age 75  . Cancer Mother 63  . Heart disease Father     . Heart attack Father   . Breast cancer Sister   . Heart disease Brother   . Brain cancer Sister   . Heart disease Brother   .  Bladder Cancer Brother   . Cancer - Other Brother     Social History Social History   Tobacco Use  . Smoking status: Former Smoker    Packs/day: 1.00    Years: 20.00    Pack years: 20.00    Types: Cigarettes    Start date: 01/18/1963    Last attempt to quit: 08/11/1988    Years since quitting: 29.3  . Smokeless tobacco: Former Systems developer    Types: Chew    Quit date: 01/11/1989  . Tobacco comment: chewed 1 pack tobacco/day for 15 years  Substance Use Topics  . Alcohol use: No    Alcohol/week: 0.0 standard drinks    Frequency: Never  . Drug use: No     Allergies   Ace inhibitors; Angiotensin receptor blockers; Ciprofloxacin; Other; Oxycodone-acetaminophen; Ramipril; Robaxin [methocarbamol]; Toradol [ketorolac tromethamine]; Valium; and Doxycycline   Review of Systems Review of Systems  Constitutional: Positive for fever. Negative for activity change.       All ROS Neg except as noted in HPI  HENT: Negative for nosebleeds.   Eyes: Negative for photophobia and discharge.  Respiratory: Negative for cough, shortness of breath and wheezing.   Cardiovascular: Negative for chest pain and palpitations.  Gastrointestinal: Positive for constipation. Negative for abdominal pain and blood in stool.  Genitourinary: Negative for dysuria, frequency and hematuria.  Musculoskeletal: Negative for arthralgias, back pain and neck pain.  Skin: Negative.   Neurological: Positive for dizziness and headaches. Negative for seizures, syncope, facial asymmetry, speech difficulty, weakness and numbness.  Psychiatric/Behavioral: Positive for confusion. Negative for hallucinations.     Physical Exam Updated Vital Signs BP (!) 146/74   Pulse 71   Temp 98.3 F (36.8 C) (Oral)   Resp 19   Ht 5\' 6"  (1.676 m)   Wt 98.7 kg   SpO2 100%   BMI 35.12 kg/m   Physical Exam   HENT:  Patient hard of hearing in the right ear.  Eyes:  There is increased redness of the conjunctiva and the bulbar conjunctiva bilaterally.  There is a small stye of the left lower lid. There is mild nystagmus with extraocular movement exercise.  Neck:  Neck sore to palpation, but no rigidity.  Cardiovascular: Normal rate, regular rhythm, normal heart sounds and normal pulses.  Pulmonary/Chest: Effort normal and breath sounds normal. No respiratory distress. He has no wheezes.  Abdominal: Bowel sounds are normal. He exhibits no distension and no pulsatile midline mass. There is no splenomegaly or hepatomegaly. There is tenderness in the periumbilical area and left lower quadrant. There is no rigidity.  Musculoskeletal: He exhibits no edema or tenderness.  Negative Homans sign  Psychiatric:  Patient states that he is at the [facility].  He is not exactly sure why he is here.  He recognizes his wife.     ED Treatments / Results  Labs (all labs ordered are listed, but only abnormal results are displayed) Labs Reviewed  CBG MONITORING, ED - Abnormal; Notable for the following components:      Result Value   Glucose-Capillary 143 (*)    All other components within normal limits  URINE CULTURE  ACETAMINOPHEN LEVEL  SALICYLATE LEVEL  ETHANOL  PROTIME-INR  APTT  CBC  DIFFERENTIAL  COMPREHENSIVE METABOLIC PANEL  RAPID URINE DRUG SCREEN, HOSP PERFORMED  URINALYSIS, ROUTINE W REFLEX MICROSCOPIC  TROPONIN I  INFLUENZA PANEL BY PCR (TYPE A & B)    EKG EKG Interpretation  Date/Time:  Sunday December 18 2017  10:44:30 EST Ventricular Rate:  76 PR Interval:    QRS Duration: 105 QT Interval:  386 QTC Calculation: 434 R Axis:   3 Text Interpretation:  Sinus rhythm Left axis deviation Baseline wander When compared with ECG of 12/14/2017 No significant change was found Confirmed by Francine Graven [phone]) on 12/18/2017 10:52:53 AM   Radiology No results  found.  Procedures Procedures (including critical care time) CRITICAL CARE Performed by: Lily Kocher Total critical care time: **40* minutes Critical care time was exclusive of separately billable procedures and treating other patients. Critical care was necessary to treat or prevent imminent or life-threatening deterioration. Critical care was time spent personally by me on the following activities: development of treatment plan with patient and/or surrogate as well as nursing, discussions with consultants, evaluation of patient's response to treatment, examination of patient, obtaining history from patient or surrogate, ordering and performing treatments and interventions, ordering and review of laboratory studies, ordering and review of radiographic studies, pulse oximetry and re-evaluation of patient's condition.  Medications Ordered in ED Medications - No data to display   Initial Impression / Assessment and Plan / ED Course  I have reviewed the triage vital signs and the nursing notes.  Pertinent labs & imaging results that were available during my care of the patient were reviewed by me and considered in my medical decision making (see chart for details).       Final Clinical Impressions(s) / ED Diagnoses MDM  Vital signs reviewed.  Blood pressure mildly elevated.  Pulse oximetry is 96% on room air.  Within normal limits by my interpretation.  Case discussed with Dr Thurnell Garbe. Orders written.  Patient complains of mild headache.  His speech is clear and understandable.  He thinks that he is at the [facility], he is not sure what day it is.  He does recognize his wife.  The patient's wife has been very helpful with giving history from last night and this morning.  I have discussed my plan with the patient's wife, and answered questions up to this point.  11:43 AM.  Notified that the patient was in the CT scanner, and complaining of nausea.  IV Zofran ordered for the  patient. I went over to evaluate the patient in the radiology suite.  The patient is moving back and forth.  He is complaining of headache being worse.  CT scan completed, but with some limitations due to patient's movement.  CBG non-acute. Influenza negative.  Chest x-ray shows no acute cardiopulmonary disease.  12:10 PM.  Patient complaining of severe headache.  Holding his head at times.  IV morphine ordered.  We will also obtain a rectal temperature. Review of old chart reveal history of chronic headaches.  12:35PM. Notified by lab that they do NOT have pt's blood for labs. I have made CHARGE NURSE made aware.  Urine drug screen positive for  opiates, otherwise negative.   1:23pm.  Complete blood count is well within normal limits.  Ammonia level normal.   Patient and patient's wife made aware of the findings of the lab test and x-rays and scans up to this point.  Questions were answered.  Telemetry neurology consultation requested.  The patient had only minimal relief from the 2 mg of morphine.  He is at times pulling at his shirt as though trying to take it off, and the patient's wife says that his confusion seems to be coming and going.  History given to the TeleNeurologist.  3:35pm. Notified by  nursing staff that pt  O2 sat is 88-90, lower than previous sat in ED. Pt placed on O2 by nasal canula.  Patient evaluated by tele-neurology.  It was their opinion that the patient had a persistent encephalopathy with question of underlying cognitive impairment.  The differential diagnosis includes a toxic metabolic encephalopathy with underlying low B12, encephalitis, seizures, stroke versus TIA.  It is further the recommendation that the patient be admitted to the hospital with a repeat work-up including MRI of the brain EEG and possible LP as the patient's temperature seems to be slowly climbing.  Patient treated with 4 mg of morphine and promethazine for headache.  Venous blood gas  obtained.  Consult placed to the triad hospitalist.  Case discussed in detail with the triad hospitalist.  They will see the patient for admission.   Final diagnoses:  Altered mental status, unspecified altered mental status type  Encephalopathy    ED Discharge Orders    None       Lily Kocher, PA-C 12/18/17 1658    Fredia Sorrow, MD 12/18/17 [phone]

## 2017-12-18 NOTE — ED Notes (Signed)
Patient transported to CT 

## 2017-12-18 NOTE — ED Notes (Signed)
Pt's wife asking for more pain medication for patient due to headache. EDP notified, per EDP, no more medication at this time until tele neuro consult.

## 2017-12-18 NOTE — H&P (Signed)
History and Physical    [name] ZYS:[id number] DOB: [birth year] DOA: 12/18/2017  PCP: Dettinger, Fransisca Kaufmann, MD   Patient coming from: Home  I have personally briefly reviewed patient's old medical records in [place]  Chief Complaint: Confusion  HPI: [name] is a 77 y.o. male with medical history significant for DM, HTN, CAD, BPH, presented to the ED with confusion that started last night till today.  History is obtained from the spouse as patient is currently confused, though answers a few questions.  Spouse reports patient "talking out of his head", not making sense.  Also reports a fever of 100.4 yesterday.  Headaches for which she has treated with OTC Excedrin since recent hospital discharge. Spouse also reported some right eye redness and mild swelling, this morning, though she also admits both eyes are chronically mildly injected.  Patient denied eye pain.    Recent hospital admission 12/4 to 12/5-also with confusion, AMS. TIA/CVA was questioned as there was a report of facial droop, word finding difficulty, which had mostly resolved by the time of ED arrival.  MRI brain done was negative.  Patient also had a headache on that admission, which resolved with conservative treatment.  The time of discharge patient was back to baseline.  MRI brain, CTA head and neck cerebral perfusion studies all negative.  B12 marginally low 167.  Patient did well, back to his baseline until yesterday evening when he became confused again, with a fever.  Symptoms of confusion have started in the evenings.  At baseline patient might intermittently forget a few things here and there but he has been fully functional without any significant memory deficits.  Spouse expressed concern, one third patient admitted, as she is afraid because of this new confusion and abnormal behavior, there are guns in the house and he could hurt himself or her.  ED Course: Temp 100.4, blood pressure systolic  932- 355D.  Chest x-ray and head CT no acute abnormality.  Chest CT with contrast-no acute abnormality.  UA-unremarkable.. UDS positive for opiates.  Normal salicylate, acetaminophen and alcohol levels.  Influenza panel negative.  Ammonia level normal 14.  Normal WBC 8.  Unremarkable CMP. Tele- Neurology was consulted in the ED-impression persistent encephalopathy question underlying cognitive impairment, with differentials toxic metabolic encephalopathy-with underlying B12 deficiency encephalitis, seizures TIA/stroke.  Consider LP, EEG.  MRI brain with and without contrast when able to rule out interval stroke given persistent AMS/aphasia, also consider HSV encephalitis. Hospitalist was called to admit for altered mental status.  Requested LP- as so far, no focus of infection identified and with behavioral changes, fever need to consider meningitis/encephalitis. ED provider attempted LP at bedside but despite Ativan and morphine given, patient was not cooperative enough for LP. Broad-spectrum antibiotics started.  Review of Systems: As per HPI all other systems reviewed and negative  Past Medical History:  Diagnosis Date  . Anxiety disorder   . Arthritis   . Bursitis of hip   . Cervical spondylosis   . Chronic headaches   . Coronary atherosclerosis of native coronary artery    BMS nondominant RCA 12/2004  . Essential hypertension, benign   . GERD (gastroesophageal reflux disease)   . History of adenomatous polyp of colon   . History of kidney stones   . History of MI (myocardial infarction) 12/2004   s/p  BMS to RCA  . Hyperlipidemia   . Internal hemorrhoids   . Low back pain   . Nocturia more  than twice per night   . Numbness of left foot   . OSA (obstructive sleep apnea)   . Recurrent headache   . S/p bare metal coronary artery stent 12/2004   BMS x1 to RCA  . Spinal stenosis   . Type 2 diabetes mellitus treated with insulin ([place])   . Urgency of urination   . Wears dentures     upper    Past Surgical History:  Procedure Laterality Date  . CARDIAC CATHETERIZATION  06-14-2004  dr Lia Foyer   total occlusion mD1, RI 30-40%, mRCA nondominant hazy 80% and scattered 30-40%,  ef 71% (medical mangement)  . CARDIOVASCULAR STRESS TEST  09-17-2015   dr Domenic Polite   Low risk nuclear study w/ reversible mild small anteroapical defect,  normal LV function and wall motion, nuclear stress ef 61%  . CATARACT EXTRACTION W/ INTRAOCULAR LENS IMPLANT Right 07/2014  . COLONOSCOPY  09/10/2008   normal  . CORONARY ANGIOPLASTY WITH STENT PLACEMENT  12-17-2004   dr benismhon/ dr Olevia Perches   nonobstructive cad LAD and LCFx,  BMS x1 to total occlusion RCA   . CYSTOSCOPY W/ URETEROSCOPY W/ LITHOTRIPSY  08/2010  . ERCP  03/14/2008  . LAPAROSCOPIC CHOLECYSTECTOMY  05/2012  . LEFT HEART CATHETERIZATION WITH CORONARY ANGIOGRAM N/A 05/14/2011   Procedure: LEFT HEART CATHETERIZATION WITH CORONARY ANGIOGRAM;  Surgeon: Sherren Mocha, MD;  Location: [facility] CATH LAB;  Service: Cardiovascular;  Laterality: N/A;    non-obstructive LM, LAD, LCFx, patent RCA stent  . LUMBAR LAMINECTOMY/DECOMPRESSION MICRODISCECTOMY N/A 02/24/2017   Procedure: Microlumbar decompression L4-5, L5-S1;  Surgeon: Susa Day, MD;  Location: [facility] ORS;  Service: Orthopedics;  Laterality: N/A;  120 mins  . ROTATOR CUFF REPAIR Right 03/2002  . ROTATOR CUFF REPAIR Left 12/11/2015  . TOTAL HIP ARTHROPLASTY Right 12/20/2008  . TOTAL KNEE ARTHROPLASTY Bilateral left 12-11-2003/  right 07-31-2004   dr Wynelle Link [facility]  . UPPER GASTROINTESTINAL ENDOSCOPY  01/08/2008   bx, inlet patch, duodenitis  . YAG LASER APPLICATION Right 6/60/6301   Procedure: YAG LASER APPLICATION;  Surgeon: Williams Che, MD;  Location: [facility] ORS;  Service: Ophthalmology;  Laterality: Right;     reports that he quit smoking about 29 years ago. His smoking use included cigarettes. He started smoking about 54 years ago. He has a 20.00 pack-year smoking history. He quit smokeless  tobacco use about 28 years ago.  His smokeless tobacco use included chew. He reports that he does not drink alcohol or use drugs.  Allergies  Allergen Reactions  . Ace Inhibitors Hives, Swelling and Rash    Rash,hives,tongue swelling  . Angiotensin Receptor Blockers     Unknown reaction   . Ciprofloxacin Other (See Comments)    confusion  . Other Hives and Other (See Comments)    Altaseptic, altase (cough)  . Oxycodone-Acetaminophen Other (See Comments)    REACTION: unknown reaction  . Ramipril Cough  . Robaxin [Methocarbamol] Other (See Comments)    Confusion   . Toradol [Ketorolac Tromethamine] Other (See Comments)    confusion  . Valium Other (See Comments)    Hallucinations; confusion  . Doxycycline Hives and Rash    Family History  Problem Relation Age of Onset  . Colon cancer Mother        Diagnosed age 67  . Cancer Mother 65  . Heart disease Father   . Heart attack Father   . Breast cancer Sister   . Heart disease Brother   . Brain cancer Sister   .  Heart disease Brother   . Bladder Cancer Brother   . Cancer - Other Brother     Prior to Admission medications   Medication Sig Start Date End Date Taking? Authorizing Provider  acetaminophen (TYLENOL) 650 MG CR tablet Take 1,300 mg by mouth 2 (two) times daily as needed for pain.    Yes [provider]  amLODipine (NORVASC) 5 MG tablet TAKE 1 TABLET BY MOUTH  DAILY Patient taking differently: Take 5 mg by mouth every morning.  11/30/17  Yes Dettinger, Fransisca Kaufmann, MD  aspirin EC 81 MG tablet Take 81 mg by mouth every evening.   Yes [provider]  aspirin-acetaminophen-caffeine (EXCEDRIN EXTRA STRENGTH) [phone] MG tablet Take 1 tablet by mouth daily as needed (headaches). May resume 5 days post-op as needed Patient taking differently: Take 2 tablets by mouth daily as needed for headache.  02/25/17  Yes Lacie Draft M, PA-C  atorvastatin (LIPITOR) 20 MG tablet TAKE 1 TABLET BY MOUTH  DAILY AT 6  PM. Patient taking differently: Take 20 mg by mouth every evening.  11/30/17  Yes Dettinger, Fransisca Kaufmann, MD  baclofen (LIORESAL) 10 MG tablet Take 1 tablet (10 mg total) by mouth at bedtime as needed for muscle spasms. 06/27/17  Yes Dettinger, Fransisca Kaufmann, MD  butalbital-acetaminophen-caffeine (FIORICET, ESGIC) [phone] MG tablet Take 1 tablet by mouth 2 (two) times daily as needed for headache. 01/17/17  Yes Terald Sleeper, PA-C  cetirizine (ZYRTEC) 10 MG tablet Take 10 mg by mouth every evening.    Yes [provider]  Cholecalciferol (VITAMIN D3) 2000 UNITS TABS Take 2,000 Units by mouth every morning.    Yes [provider]  docusate sodium (COLACE) 100 MG capsule Take 1 capsule (100 mg total) by mouth 2 (two) times daily. 02/24/17 02/24/18 Yes Susa Day, MD  famotidine (PEPCID) 20 MG tablet TAKE 1 TABLET BY MOUTH TWO  TIMES DAILY Patient taking differently: Take 20 mg by mouth 2 (two) times daily.  11/30/17  Yes Dettinger, Fransisca Kaufmann, MD  HUMALOG KWIKPEN 100 UNIT/ML KwikPen INJECT 0 TO 15 UNITS  SUBCUTANEOUSLY 3 TIMES  DAILY BEFORE MEALS  (70-150=8 UNITS, 151-200=9  UNITS) Patient taking differently: Inject 0-10 Units into the skin 3 (three) times daily before meals.  11/30/17  Yes Dettinger, Fransisca Kaufmann, MD  HYDROcodone-acetaminophen (NORCO) 10-325 MG tablet Take 1 tablet by mouth every 4 (four) hours as needed for severe pain. 02/24/17  Yes Susa Day, MD  LANTUS SOLOSTAR 100 UNIT/ML Solostar Pen INJECT SUBCUTANEOUSLY 52  UNITS AT BEDTIME Patient taking differently: Inject 52 Units into the skin at bedtime.  07/11/17  Yes Dettinger, Fransisca Kaufmann, MD  losartan (COZAAR) 50 MG tablet TAKE 1 TABLET BY MOUTH  DAILY Patient taking differently: Take 50 mg by mouth every morning.  11/30/17  Yes Dettinger, Fransisca Kaufmann, MD  metFORMIN (GLUCOPHAGE-XR) 500 MG 24 hr tablet TAKE 2 TABLETS BY MOUTH TWO TIMES DAILY Patient taking differently: Take 1,000 mg by mouth 2 (two) times daily.  11/30/17  Yes  Dettinger, Fransisca Kaufmann, MD  PARoxetine (PAXIL) 20 MG tablet TAKE 1 TABLET BY MOUTH  DAILY Patient taking differently: Take 20 mg by mouth every evening.  11/30/17  Yes Dettinger, Fransisca Kaufmann, MD  polyethylene glycol (MIRALAX / GLYCOLAX) packet Take 17 g by mouth daily. Patient taking differently: Take 17 g by mouth daily as needed for mild constipation.  02/24/17  Yes Susa Day, MD  tamsulosin (FLOMAX) 0.4 MG CAPS capsule TAKE 1 CAPSULE BY MOUTH  DAILY Patient taking differently: Take 0.4 mg by mouth every evening.  11/30/17  Yes Dettinger, Fransisca Kaufmann, MD  Menthol, Topical Analgesic, (BLUE-EMU MAXIMUM STRENGTH EX) Apply 1 application topically daily as needed (muscle pain).    [provider]  nitroGLYCERIN (NITROSTAT) 0.4 MG SL tablet DISSOLVE ONE TABLET UNDER THE TONGUE EVERY 5 MINUTES AS NEEDED FOR CHEST PAIN.  DO NOT EXCEED A TOTAL OF 3 DOSES IN 15 MINUTES Patient taking differently: Place 0.4 mg under the tongue every 5 (five) minutes as needed. DISSOLVE ONE TABLET UNDER THE TONGUE EVERY 5 MINUTES AS NEEDED FOR CHEST PAIN.  DO NOT EXCEED A TOTAL OF 3 DOSES IN 15 MINUTES 02/03/17   Satira Sark, MD  ONE TOUCH ULTRA TEST test strip USE 1 STRIP TO CHECK GLUCOSE 4 TIMES DAILY Patient not taking: Reported on 12/18/2017 09/05/17   Dettinger, Fransisca Kaufmann, MD    Physical Exam: Vitals:   12/18/17 1614 12/18/17 1700 12/18/17 1730 12/18/17 1800  BP:  (!) 185/82 (!) 177/83 (!) 154/81  Pulse:  95 100 97  Resp:  (!) 21 10 (!) 23  Temp: (!) 100.4 F (38 C)     TempSrc: Rectal     SpO2:  (!) 87% 92% 91%  Weight:      Height:        Constitutional: Following some directions, not fully cooperative with exam Vitals:   12/18/17 1614 12/18/17 1700 12/18/17 1730 12/18/17 1800  BP:  (!) 185/82 (!) 177/83 (!) 154/81  Pulse:  95 100 97  Resp:  (!) 21 10 (!) 23  Temp: (!) 100.4 F (38 C)     TempSrc: Rectal     SpO2:  (!) 87% 92% 91%  Weight:      Height:       Eyes: PERRL, no surrounding  eye swelling appreciated, mild bilateral conjunctival injection, chronic per spouse. No eye drainage. Unable to examine posterior pharynx.  Neck: normal, no masses, no thyromegaly, s/p morphine, sleeping but arousable, but unable to fully ascertain if neck is supple, negative kernigs.  Respiratory: clear to auscultation bilaterally, no wheezing, no crackles. Normal respiratory effort. No accessory muscle use.  Cardiovascular: Regular rate and rhythm, no murmurs / rubs / gallops. No extremity edema. 2+ pedal pulses. No carotid bruits.  Abdomen: full but without tenderness, no masses palpated. No hepatosplenomegaly. Bowel sounds positive.  Musculoskeletal: no clubbing / cyanosis. No joint deformity upper and lower extremities. Good ROM, no contractures. Normal muscle tone.  Skin: no rashes, lesions, ulcers. No induration Neurologic: Sleeping, arousable, unable to fully access, no obvious deficit, moving all extremities spontaneously. Psychiatric: UNAble to assess   Labs on Admission: I have personally reviewed following labs and imaging studies  CBC: Recent Labs  Lab 12/14/17 1857 12/14/17 1904 12/15/17 0552 12/18/17 1104  WBC 7.8  --   --  8.0  NEUTROABS 4.4  --   --  5.2  HGB 14.1 14.6  --  15.5  HCT 45.1 43.0 41.8 49.0  MCV 90.2  --   --  89.9  PLT 285  --   --  595   Basic Metabolic Panel: Recent Labs  Lab 12/14/17 1857 12/14/17 1904 12/18/17 1104  NA 136 138 135  K 3.5 3.6 3.6  CL 104 103 101  CO2 24  --  25  GLUCOSE 73 68* 112*  BUN 17 17 15   CREATININE 0.92 0.90 0.94  CALCIUM 9.0  --  8.8*   Liver Function Tests: Recent  Labs  Lab 12/14/17 1857 12/18/17 1104  AST 16 23  ALT 19 23  ALKPHOS 65 64  BILITOT 0.4 1.0  PROT 7.4 7.7  ALBUMIN 3.8 4.0   No results for input(s): LIPASE, AMYLASE in the last 168 hours. Recent Labs  Lab 12/18/17 1246  AMMONIA 14   Coagulation Profile: Recent Labs  Lab 12/14/17 1857 12/18/17 1104  INR 0.94 0.99   Cardiac  Enzymes: Recent Labs  Lab 12/18/17 1104  TROPONINI <0.03   CBG: Recent Labs  Lab 12/15/17 0310 12/15/17 0604 12/15/17 1104 12/18/17 1116 12/18/17 1819  GLUCAP 168* 145* 264* 143* 150*   Urine analysis:    Component Value Date/Time   COLORURINE YELLOW 12/18/2017 [place] 12/18/2017 1104   LABSPEC 1.018 12/18/2017 1104   PHURINE 5.0 12/18/2017 1104   GLUCOSEU NEGATIVE 12/18/2017 1104   HGBUR SMALL (A) 12/18/2017 1104   BILIRUBINUR NEGATIVE 12/18/2017 1104   BILIRUBINUR neg 12/10/2014 1435   KETONESUR NEGATIVE 12/18/2017 1104   PROTEINUR 30 (A) 12/18/2017 1104   UROBILINOGEN negative 12/10/2014 1435   UROBILINOGEN 0.2 12/09/2008 1006   NITRITE NEGATIVE 12/18/2017 1104   LEUKOCYTESUR NEGATIVE 12/18/2017 1104    Radiological Exams on Admission: Dg Chest 1 View  Result Date: 12/18/2017 CLINICAL DATA:  Altered mental status. EXAM: CHEST  1 VIEW COMPARISON:  12/14/2017 FINDINGS: There is mild left basilar airspace disease likely reflecting atelectasis. There is no other areas of focal consolidation. There is no pleural effusion or pneumothorax. The heart and mediastinal contours are unremarkable. The osseous structures are unremarkable. IMPRESSION: 1. No acute cardiopulmonary disease. Electronically Signed   By: Kathreen Devoid   On: 12/18/2017 12:27   Ct Head Wo Contrast  Result Date: 12/18/2017 CLINICAL DATA:  Confusion beginning yesterday.  Agitation. EXAM: CT HEAD WITHOUT CONTRAST TECHNIQUE: Contiguous axial images were obtained from the base of the skull through the vertex without intravenous contrast. COMPARISON:  CT 12/14/2017.  MRI 12/15/2017. FINDINGS: Brain: No change. Brain atrophy. Chronic small-vessel ischemic changes of the hemispheric white matter. No sign of acute infarction, mass lesion, hemorrhage, hydrocephalus or extra-axial collection. Vascular: There is atherosclerotic calcification of the major vessels at the base of the brain. Skull: Negative  Sinuses/Orbits: Clear/normal Other: None IMPRESSION: No change. No acute finding. Atrophy and chronic small-vessel ischemic change. Electronically Signed   By: Nelson Chimes M.D.   On: 12/18/2017 12:29   Ct Chest W Contrast  Result Date: 12/18/2017 CLINICAL DATA:  Altered mental status, dizziness, low-grade fever EXAM: CT CHEST WITH CONTRAST TECHNIQUE: Multidetector CT imaging of the chest was performed during intravenous contrast administration. CONTRAST:  30mL ISOVUE-300 IOPAMIDOL (ISOVUE-300) INJECTION 61% COMPARISON:  Chest radiograph dated 12/18/2017 FINDINGS: Motion degraded images (patient and respiratory motion). Cardiovascular: The heart is top-normal in size. No pericardial effusion. No evidence of thoracic aortic aneurysm. Atherosclerotic calcifications of the aortic arch. Three vessel coronary atherosclerosis. Mediastinum/Nodes: No suspicious mediastinal lymphadenopathy. Visualized thyroid is unremarkable. Lungs/Pleura: Lungs are motion degraded. Within that constraint, there are no suspicious pulmonary nodules. No focal consolidation. Minimal dependent atelectasis in the bilateral lower lobes. Trace paraseptal emphysematous changes in the bilateral upper lobes. No pleural effusion or pneumothorax. Upper Abdomen: Visualized upper abdomen is notable for prior cholecystectomy. Musculoskeletal: Degenerative changes of the visualized thoracolumbar spine. IMPRESSION: Motion degraded images. No evidence of acute cardiopulmonary disease. Aortic Atherosclerosis (ICD10-I70.0) and Emphysema (ICD10-J43.9). Electronically Signed   By: Julian Hy M.D.   On: 12/18/2017 18:59    EKG: Independently reviewed.  QTC 434 no significant change compared to prior  Assessment/Plan Active Problems:   AMS (altered mental status)   Metabolic encephalopathy-with fever 100.4, headaches, confusion-behavior change.  WBC normal 8 UA clean.  Chest CT unremarkable.  Recent admission, ?TIA, MRI negative for infarct.   Head CT negative for acute abnormality.  Mild low B12 recent admission.  Concern for meningitis/encephalitis. ?viral, considering indolent nature over the past 5 days. LP attempted in the ED failed. -Continue IV Vanco, ceftriaxone, ampicillin, started in ED -Will add acyclovir  -Antibiotics already given in ED, will hold off on dexamethasone (Per UTD Recommendations) -Get blood cultures -Follow-up urine cultures -Consider LP in a.m, Per radiology - EEG -IV Vit B12 1062mcg x 1 - NPO for now till improvement in mental status - BMP, CBC a.m - Sitter at bedside  CAD- with MI, hx of BMS to RCA 2006. Follows with Dr. Domenic Polite.  Stable, denies chest pain no difficulty breathing.  EKG unchanged from prior. Trop X 1 negative - Hold aspirin and statins while NPO  DM-glucose 73.  Home medications Lantus 52 units daily, 10U novolog with meals.  And metformin. - Hgba1c - SSI - Hold home lantus, Novolog  HTN-160s to 170s. -resume home antiHTNs  DVT prophylaxis: Scds Code Status: Full Family Communication: Spouse at bedside Disposition Plan: per rounding team Consults called: Neuro-tele in ED Admission status: Inpt, tele   Bethena Roys MD [facility] Pager [phone] From 3PM- 11PM.  Otherwise please contact night-coverage [email or web address] Password [email or web address]  12/18/2017, 7:30 PM

## 2017-12-18 NOTE — ED Notes (Signed)
Teleneurologist called for stat consult. Will have neurologist call back.

## 2017-12-18 NOTE — ED Notes (Signed)
Pt began vomiting in CT.  Was medicated with zofran by this RN.  Pt was no comprehending instructions to hold still for CT scan and was thrashing around stating his head was hurting.  When pt brought back to room, wife states pt has been running a fever intermittently and has been medicating with excedrin.  Temps have been as high as 101.

## 2017-12-18 NOTE — ED Notes (Signed)
EDP at bedside. Phlebotomy at bedside.

## 2017-12-18 NOTE — ED Notes (Signed)
Pt transported to CT ?

## 2017-12-18 NOTE — ED Triage Notes (Addendum)
Patient brought in via EMS. Patient brought in for altered mental status. Patient seen here in ED last week for same complaint. Per wife patient improved and was his "normal after being discharged until yesterday night at 6pm. Patient still confused. Wife states confusion was worse at night. Denies any weakness, slurred speech, or facial drooping. Wife told paramedics patient does have swelling and redness to right eye. Wife told Paramedics patient has had fever of 99.8-100.3. Patient does report some dizziness. Denies any pain.

## 2017-12-18 NOTE — Progress Notes (Signed)
Pharmacy Antibiotic Note  [name] is a 77 y.o. male admitted on 12/18/2017 with meningitis.  Pharmacy has been consulted for vancomycin and acyclovir dosing.  Plan: Vancomycin 1250mg  IV every 12 hours.  Goal trough 15-20 mcg/mL. acyclovir 640mg  iv q8h  Height: 5\' 6"  (167.6 cm) Weight: 217 lb 9.5 oz (98.7 kg) IBW/kg (Calculated) : 63.8  Temp (24hrs), Avg:99.3 F (37.4 C), Min:98.3 F (36.8 C), Max:100.4 F (38 C)  Recent Labs  Lab 12/14/17 1857 12/14/17 1904 12/18/17 1104  WBC 7.8  --  8.0  CREATININE 0.92 0.90 0.94    Estimated Creatinine Clearance: 72.4 mL/min (by C-G formula based on SCr of 0.94 mg/dL).    Allergies  Allergen Reactions  . Ace Inhibitors Hives, Swelling and Rash    Rash,hives,tongue swelling  . Angiotensin Receptor Blockers     Unknown reaction   . Ciprofloxacin Other (See Comments)    confusion  . Other Hives and Other (See Comments)    Altaseptic, altase (cough)  . Oxycodone-Acetaminophen Other (See Comments)    REACTION: unknown reaction  . Ramipril Cough  . Robaxin [Methocarbamol] Other (See Comments)    Confusion   . Toradol [Ketorolac Tromethamine] Other (See Comments)    confusion  . Valium Other (See Comments)    Hallucinations; confusion  . Doxycycline Hives and Rash    Antimicrobials this admission: 12/8 acyclovir >>  12/8 vancomycin >>  12/8 ampicillin >> 12/8 ceftriaxone>>  Anti-infectives (From admission, onward)   Start     Dose/Rate Route Frequency Ordered Stop   12/19/17 0900  vancomycin (VANCOCIN) 1,250 mg in sodium chloride 0.9 % 250 mL IVPB     1,250 mg 166.7 mL/hr over 90 Minutes Intravenous Every 12 hours 12/18/17 2143     12/18/17 2145  acyclovir (ZOVIRAX) 640 mg in dextrose 5 % 100 mL IVPB     10 mg/kg  63.8 kg (Ideal) 112.8 mL/hr over 60 Minutes Intravenous Every 8 hours 12/18/17 2143     12/18/17 2145  vancomycin (VANCOCIN) IVPB 1000 mg/200 mL premix     1,000 mg 200 mL/hr over 60 Minutes  Intravenous  Once 12/18/17 2143     12/18/17 2130  cefTRIAXone (ROCEPHIN) 2 g in sodium chloride 0.9 % 100 mL IVPB     2 g 200 mL/hr over 30 Minutes Intravenous Every 24 hours 12/18/17 2112     12/18/17 2130  ampicillin (OMNIPEN) 2 g in sodium chloride 0.9 % 100 mL IVPB     2 g 300 mL/hr over 20 Minutes Intravenous Every 4 hours 12/18/17 2112     12/18/17 1915  ampicillin (OMNIPEN) 2 g in sodium chloride 0.9 % 100 mL IVPB     2 g 300 mL/hr over 20 Minutes Intravenous  Once 12/18/17 1910 12/18/17 2050   12/18/17 1915  cefTRIAXone (ROCEPHIN) 2 g in sodium chloride 0.9 % 100 mL IVPB     2 g 200 mL/hr over 30 Minutes Intravenous  Once 12/18/17 1910 12/18/17 2024   12/18/17 1915  vancomycin (VANCOCIN) IVPB 1000 mg/200 mL premix     1,000 mg 200 mL/hr over 60 Minutes Intravenous  Once 12/18/17 1910       Microbiology results: 12/8 BCx: sent 12/8 UCx: sent    Thank you for allowing pharmacy to be a part of this patient's care.  Donna Christen Toby Ayad 12/18/2017 9:44 PM

## 2017-12-19 ENCOUNTER — Inpatient Hospital Stay (HOSPITAL_COMMUNITY)
Admit: 2017-12-19 | Discharge: 2017-12-19 | Disposition: A | Discharge status: Home / Self Care | Payer: Medicare Other | Attending: Internal Medicine | Admitting: Internal Medicine

## 2017-12-19 ENCOUNTER — Inpatient Hospital Stay (HOSPITAL_COMMUNITY): Payer: Medicare Other

## 2017-12-19 ENCOUNTER — Encounter (HOSPITAL_COMMUNITY): Payer: Self-pay | Admitting: Family Medicine

## 2017-12-19 DIAGNOSIS — I700 Atherosclerosis of aorta: Secondary | ICD-10-CM

## 2017-12-19 DIAGNOSIS — G9341 Metabolic encephalopathy: Secondary | ICD-10-CM

## 2017-12-19 HISTORY — DX: Atherosclerosis of aorta: I70.0

## 2017-12-19 LAB — GLUCOSE, CAPILLARY
GLUCOSE-CAPILLARY: 148 mg/dL — AB (ref 70–99)
GLUCOSE-CAPILLARY: 143 mg/dL — AB (ref 70–99)
Glucose-Capillary: 190 mg/dL — ABNORMAL HIGH (ref 70–99)
Glucose-Capillary: 153 mg/dL — ABNORMAL HIGH (ref 70–99)
Glucose-Capillary: 137 mg/dL — ABNORMAL HIGH (ref 70–99)

## 2017-12-19 LAB — CBC
HCT: 47.8 % (ref 39.0–52.0)
Hemoglobin: 15.4 g/dL (ref 13.0–17.0)
MCH: 29.3 pg (ref 26.0–34.0)
MCHC: 32.2 g/dL (ref 30.0–36.0)
MCV: 91 fL (ref 80.0–100.0)
Platelets: 314 10*3/uL (ref 150–400)
RBC: 5.25 MIL/uL (ref 4.22–5.81)
RDW: 13.6 % (ref 11.5–15.5)
WBC: 8.2 10*3/uL (ref 4.0–10.5)
nRBC: 0 % (ref 0.0–0.2)

## 2017-12-19 LAB — BASIC METABOLIC PANEL
Anion gap: 11 (ref 5–15)
BUN: 13 mg/dL (ref 8–23)
CO2: 24 mmol/L (ref 22–32)
Calcium: 8.5 mg/dL — ABNORMAL LOW (ref 8.9–10.3)
Chloride: 100 mmol/L (ref 98–111)
Creatinine, Ser: 0.85 mg/dL (ref 0.61–1.24)
GFR calc Af Amer: >60 mL/min (ref >60)
GFR calc non Af Amer: >60 mL/min (ref >60)
Glucose, Bld: 194 mg/dL — ABNORMAL HIGH (ref 70–99)
Potassium: 3.5 mmol/L (ref 3.5–5.1)
SODIUM: 135 mmol/L (ref 135–145)

## 2017-12-19 MED ORDER — ACETAMINOPHEN 325 MG PO TABS
650.0000 mg | ORAL_TABLET | Freq: Four times a day (QID) | ORAL | Status: DC | PRN
  Administered 2017-12-20 – 2017-12-22 (×2): 650 mg via ORAL
  Filled 2017-12-19 (×2): qty 2

## 2017-12-19 MED ORDER — SODIUM CHLORIDE 0.9 % IV SOLN
INTRAVENOUS | Status: AC
  Filled 2017-12-19 (×2): qty 2000

## 2017-12-19 MED ORDER — SODIUM CHLORIDE 0.9 % IV SOLN
2.0000 g | Freq: Two times a day (BID) | INTRAVENOUS | Status: DC
  Administered 2017-12-19 – 2017-12-20 (×4): 2 g via INTRAVENOUS
  Filled 2017-12-19 (×4): qty 2
  Filled 2017-12-19 (×3): qty 20

## 2017-12-19 MED ORDER — ONDANSETRON HCL 4 MG/2ML IJ SOLN
4.0000 mg | Freq: Four times a day (QID) | INTRAMUSCULAR | Status: DC | PRN
  Administered 2017-12-19 – 2017-12-20 (×2): 4 mg via INTRAVENOUS
  Filled 2017-12-19 (×2): qty 2

## 2017-12-19 MED ORDER — ACYCLOVIR SODIUM 50 MG/ML IV SOLN
INTRAVENOUS | Status: AC
  Filled 2017-12-19: qty 10

## 2017-12-19 MED ORDER — MORPHINE SULFATE (PF) 2 MG/ML IV SOLN
2.0000 mg | INTRAVENOUS | Status: DC | PRN
  Administered 2017-12-19 – 2017-12-21 (×7): 2 mg via INTRAVENOUS
  Filled 2017-12-19: qty 2
  Filled 2017-12-19 (×6): qty 1

## 2017-12-19 MED ORDER — POTASSIUM CHLORIDE IN NACL 20-0.9 MEQ/L-% IV SOLN
INTRAVENOUS | Status: AC
  Administered 2017-12-19 – 2017-12-20 (×3): via INTRAVENOUS

## 2017-12-19 MED ORDER — CYANOCOBALAMIN 1000 MCG/ML IJ SOLN
1000.0000 ug | Freq: Every day | INTRAMUSCULAR | Status: DC
  Administered 2017-12-19 – 2017-12-23 (×5): 1000 ug via INTRAMUSCULAR
  Filled 2017-12-19 (×4): qty 1

## 2017-12-19 NOTE — Progress Notes (Signed)
EEG completed, results pending. 

## 2017-12-19 NOTE — Progress Notes (Signed)
PROGRESS NOTE  [name] NUU:[id number] DOB: 10-29-1940 DOA: 12/18/2017 PCP: Dettinger, Fransisca Kaufmann, MD  Brief Narrative: 77 year old man PMH diabetes mellitus type 2, essential hypertension, CAD, recently admitted 12/4-12/5 for word finding difficulty, presented with recurrent confusion.  Teleneurology was consulted with impression been persistent encephalopathy, question underlying cognitive impairment.  LP was attempted in the emergency department but despite Ativan and morphine patient was not cooperative enough to proceed.  There was concern for meningitis/encephalitis.  Assessment/Plan Acute encephalopathy with headache, PMH chronic headaches, possible underlying cognitive impairment.  CBG unremarkable on admission.  Influenza negative.  WBC within normal limits.  Urinalysis unremarkable.  Ammonia level within normal limits.  TSH within normal limits.  Tylenol and salicylates within normal limits.  Urine drug screen positive for opiates.  CT chest no acute disease.  MRI brain negative 12/5.  CTA head unremarkable, CTA neck 12/4 negative for dissection, hemodynamically significant stenosis. --Tmax 100.4 --Differential broad including encephalitis, seizure, toxic metabolic encephalopathy, doubt B12 deficiency as etiology.   --Hold baclofen, Flomax --Give thiamine --MRI brain with and without contrast to rule out interval stroke.  Rule out seizure.  EEG.  LP recommended about wife defers for now. --Continue neurochecks --Continue empiric antibiotics, acyclovir  Borderline acute hypoxic respiratory failure --Probably secondary to atelectasis, immobility.  Appears stable at this point.  B12 deficiency --Replete  CAD --Continue atorvastatin, losartan, aspirin when able to take p.o.  Diabetes mellitus type 2.  Hemoglobin A1c 7.3. --Hold metformin.   --CBG stable continue sliding scale insulin.  Resume Lantus when diet is improved.  Essential hypertension --Amlodipine  Aortic  atherosclerosis --No inpatient treatment indicated.  Incidental finding of 4 cm ascending aortic aneurysm by CTA neck last admission 12/4. --Recommend annual imaging by CTA or MRI, next study December 2020.  Admitted 12/4-12/5 for word finding difficulty, right facial droop and confusion.  CVA work-up was negative for acute stroke.  Recommendations were for outpatient physical therapy, neurology and cardiology follow-up.   Patient briefly awakens and moves all limbs spontaneously.  He is afebrile and hemodynamically stable.  He is on broad-spectrum antibiotics and acyclovir.  I do have concern for HSV encephalitis.  I discussed the above work-up and current suspicions with wife at bedside.  The patient has been difficult to sedate and she is worried about pursuing lumbar puncture at this time and requests deferring this.  As the patient's condition improves and he becomes able to follow commands, would pursue a lumbar puncture.  For now continue empiric anti-infective therapy.  MRI brain and EEG pending.  Neurology consultation if available.  DVT prophylaxis: SCDs Code Status: Full Family Communication: wife at bedside Disposition Plan: home    Murray Hodgkins, MD  [facility] Direct contact: [phone] --[place]ia [place]  --[email or web address]; password [email or web address]  7PM-7AM contact night coverage as above 12/19/2017, 11:54 AM  LOS: 1 day   Consultants:  Neurology   Procedures:    Antimicrobials:  Acyclovir 12/8  Vancomycin 12/8  Ceftriaxone 12/8  Ampicillin 12/8  Interval history/Subjective: Wife at bedside provides history.  She reports he has a history of chronic, severe headaches, to the point in the past that he almost desired to kill himself to end the pain.  She reports since discharge 12/5 he has had good days and bad days, symptoms tend to be the worst at night, he has had intermittent confusion, he developed a severe headache 12/7 and 12/8 became quite confused,  gibberish at times noted, no focal weakness noted.  Objective:  Vitals:  Vitals:   12/18/17 2103 12/19/17 0500  BP: (!) 162/86 (!) 143/74  Pulse: 89 82  Resp: (!) 21 (!) 22  Temp: 98.6 F (37 C) 98.5 F (36.9 C)  SpO2: 97% 92%    Exam:  Constitutional:  . Appears calm and comfortable, sleeping, having just received morphine.  Arouses briefly to voice. Eyes:  . pupils and irises appear normal . Normal lids  ENMT:  . grossly normal hearing  . Lips appear normal Neck:  . Nontender to palpation, patient moves to command without any pain.  Range of movement appears intact. Respiratory:  . CTA bilaterally, no w/r/r.  . Respiratory effort normal. Cardiovascular:  . RRR, no m/r/g . No LE extremity edema   Abdomen:  . Soft, nontender, nondistended Musculoskeletal:  . Digits/nails BUE: no clubbing, cyanosis, petechiae, infection . RUE, LUE, RLE, LLE   o strength and tone normal, no atrophy, no abnormal movements Skin:  . No rashes, lesions, ulcers Neurologic:  . Difficult to assess as the patient is somnolent from morphine but he does arouse briefly.  Pupils are equal round and reactive to light.  Cranial nerves appear grossly intact, face symmetric although exam is very limited.  Moves all limbs spontaneously. Psychiatric:  . Mental status o Mood, affect cannot currently be assessed o Oriented to wife, not location or month.  "2020" . judgment and insight appear impaired  I have personally reviewed the following:   Data: . BMP unremarkable.  Anion gap within normal limits.  CBC unremarkable. . EKG sinus rhythm, no acute changes . CT chest noted  Scheduled Meds: . cyanocobalamin  1,000 mcg Intramuscular Once  . cyanocobalamin  1,000 mcg Intramuscular Daily  . insulin aspart  0-15 Units Subcutaneous Q4H   Continuous Infusions: . 0.9 % NaCl with KCl 20 mEq / L 75 mL/hr at 12/19/17 0112  . acyclovir    . ampicillin (OMNIPEN) IV 2 g (12/19/17 0705)  . cefTRIAXone  (ROCEPHIN)  IV 2 g (12/19/17 1152)  . vancomycin 1,250 mg (12/19/17 1006)  . vancomycin      Principal Problem:   Acute metabolic encephalopathy Active Problems:   CORONARY ATHEROSCLEROSIS NATIVE CORONARY ARTERY   Type 2 diabetes mellitus with circulatory disorder (HCC)   BPH (benign prostatic hyperplasia)   Aortic atherosclerosis ([place])   LOS: 1 day

## 2017-12-19 NOTE — Procedures (Signed)
ELECTROENCEPHALOGRAM REPORT   Patient: [name]       Room #: L244 EEG No. ID: [id number] Age: 77 y.o.        Sex: male Referring Physician: Sarajane Jews Report Date:  12/19/2017        Interpreting Physician: Alexis Goodell  History: [name] is an 77 y.o. male with altered mental status  Medications:  Acyclovir, Omnipen, Rocephin, B12, Insulin, Vancomycin, Morphine  Conditions of Recording:  This is a 21 channel routine scalp EEG performed with bipolar and monopolar montages arranged in accordance to the international 10/20 system of electrode placement. One channel was dedicated to EKG recording.  The patient is in the altered state.  Description:  The background activity is slow an poorly organized.  It consists of a mixture of frequencies with a polymorphic delta activity being most prominent.  Some intermixed theta and superimposed beta is noted as well.  This activity is diffusely distributed and continuous.   No epileptiform activity is noted.  Stage II sleep is not obtained. Hyperventilation and intermittent photic stimulation were not performed.   IMPRESSION: This EEG is characterized by slowing which is consistent with normal drowse.  Can not rule out the possibility of slowing related to general cerebral disturbance such as a metabolic encephalopathy.  Clinical correlation recommended.  No epileptiform activity is noted.       Alexis Goodell, MD Neurology [phone] 12/19/2017, 5:42 PM

## 2017-12-20 ENCOUNTER — Inpatient Hospital Stay (HOSPITAL_COMMUNITY): Payer: Medicare Other

## 2017-12-20 LAB — URINE CULTURE: Culture: NO GROWTH

## 2017-12-20 LAB — CSF CELL COUNT WITH DIFFERENTIAL
Eosinophils, CSF: 0 % (ref 0–1)
Lymphs, CSF: 92 % — ABNORMAL HIGH (ref 40–80)
Monocyte-Macrophage-Spinal Fluid: 8 % — ABNORMAL LOW (ref 15–45)
OTHER CELLS CSF: 0
RBC Count, CSF: 0 /mm3
Segmented Neutrophils-CSF: 0 % (ref 0–6)
Tube #: 3
WBC CSF: 275 /mm3 — AB (ref 0–5)

## 2017-12-20 LAB — PROTEIN, CSF: Total  Protein, CSF: 97 mg/dL — ABNORMAL HIGH (ref 15–45)

## 2017-12-20 LAB — GLUCOSE, CAPILLARY
GLUCOSE-CAPILLARY: 149 mg/dL — AB (ref 70–99)
Glucose-Capillary: 181 mg/dL — ABNORMAL HIGH (ref 70–99)
Glucose-Capillary: 172 mg/dL — ABNORMAL HIGH (ref 70–99)
Glucose-Capillary: 135 mg/dL — ABNORMAL HIGH (ref 70–99)
Glucose-Capillary: 180 mg/dL — ABNORMAL HIGH (ref 70–99)
Glucose-Capillary: 181 mg/dL — ABNORMAL HIGH (ref 70–99)

## 2017-12-20 LAB — GLUCOSE, CSF: Glucose, CSF: 78 mg/dL — ABNORMAL HIGH (ref 40–70)

## 2017-12-20 LAB — CRYPTOCOCCAL ANTIGEN, CSF: Crypto Ag: NEGATIVE

## 2017-12-20 MED ORDER — INSULIN ASPART 100 UNIT/ML ~~LOC~~ SOLN: 0.0000 [IU] | Freq: Every day | SUBCUTANEOUS | Status: DC

## 2017-12-20 MED ORDER — INSULIN ASPART 100 UNIT/ML ~~LOC~~ SOLN
0.0000 [IU] | Freq: Three times a day (TID) | SUBCUTANEOUS | Status: DC
  Administered 2017-12-20 – 2017-12-21 (×3): 3 [IU] via SUBCUTANEOUS
  Administered 2017-12-21: 2 [IU] via SUBCUTANEOUS
  Administered 2017-12-22: 11 [IU] via SUBCUTANEOUS
  Administered 2017-12-22: 2 [IU] via SUBCUTANEOUS
  Administered 2017-12-22 – 2017-12-23 (×2): 3 [IU] via SUBCUTANEOUS
  Administered 2017-12-23: 8 [IU] via SUBCUTANEOUS

## 2017-12-20 MED ORDER — SODIUM CHLORIDE 0.9 % IV SOLN
INTRAVENOUS | Status: DC | PRN
  Administered 2017-12-20: 250 mL via INTRAVENOUS

## 2017-12-20 NOTE — Procedures (Signed)
Preprocedure Dx: Acute encephalopathy Postprocedure Dx: Acute encephalopathy Procedure:  Fluoroscopically guided lumbar puncture Radiologist:  Thornton Papas Anesthesia:  3 ml of 1% lidocaine Specimen:  14 ml CSF, clear, colorless; minimal blood in last tube due to patient motion. EBL:   < 1 ml Opening pressure: 30 cm O1Z Complications: None

## 2017-12-20 NOTE — Care Management Important Message (Signed)
Important Message  Patient Details  Name: [name] MRN: [id number] Date of Birth: [birth year]   Medicare Important Message Given:  Yes    Rain, Friedt 12/20/2017, 1:14 PM

## 2017-12-20 NOTE — Progress Notes (Signed)
Paged by RN about abnormal CSF results:  WBC--275 (100% mononuclear cells; 0% polys) RBC--zero Glucose--78 Protein 97 -gram stain, culture, VDRL, cryptococcus, HSV--all pending -preliminary results suggest aseptic meningitis -culture yield will be low as pt has been on abx x 48 hrs -if gram stain is negative-->would d/c vanco, ampicillin, ceftriaxone -continue acyclovir IV pending HSV PCR  DTat

## 2017-12-20 NOTE — Progress Notes (Signed)
PROGRESS NOTE  [name] LTJ:[id number] DOB: 1940/07/13 DOA: 12/18/2017 PCP: Dettinger, Fransisca Kaufmann, MD  Brief History:  77 year old male with a history of diabetes mellitus, hypertension, coronary disease, BPH, hyperlipidemia presenting with confusion.  Patient was recently admitted to the hospital from 12/19 through 12/15/2017.  At that time, the patient had word finding difficulty and apparently facial droop.  He had MRI of the brain 12/15/2017 which was negative for any acute findings.  He was discharged with a working diagnosis of TIA.  CT angiogram of the head and neck at that time was negative except for incidental finding of 4 cm ascending aortic aneurysm.  The patient was back to his normal baseline at time of discharge.  According to the patient's wife, the patient had been having low-grade fevers at home since discharge from the hospital.  He had a T-max of 100.4 F on 12/17/2017.  Associated with this, the patient was noted to have increasing confusion on the evening of 12/17/2017.  As result, the patient was brought to the hospital for further evaluation.  The patient has had chronic headaches in the past for which she was taking Fioricet.  The patient has been complaining of bifrontal headache, but the patient's wife states that he had been also grabbing his neck and the back of his head.  The patient had a nonproductive cough but there was no shortness of breath, vomiting, diarrhea, unusual rashes, synovitis.  His wife states that the patient has not been overusing his baclofen or hydrocodone or Fioricet at home.  Upon presentation, the patient was noted to have a temperature of 100.4 in the emergency department.  BMP, CBC, and LFTs were essentially unremarkable.  LP was attempted in the emergency department, but was unsuccessful secondary to the patient's agitation despite using Ativan and morphine.    Assessment/Plan: Acute metabolic encephalopathy -Suspect the patient had  nonspecific viral type infection -My clinical suspicion of CNS infection is low -Wife is now amendable to repeat lumbar puncture under fluoroscopy -She refuses for the patient to have a repeat bedside lumbar puncture -I will request IR to perform lumbar puncture -Please send CSF for cell count, culture, HSV PCR, protein, glucose, VDRL, cryptococcus antigen -Overall, I suspect the patient has underlying cognitive impairment/dementia with acute delirium secondary to an infectious process, likely viral -Clinical history suggest that the patient has had mild cognitive decline over the last 2 years when speaking with the patient's wife--he gets lost while driving, 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, and has had intermittent episodes of forgetfulness -Serum Q76 226--> certainly, this may have had a role in the patient's cognitive decline, but doubtful that it causes acute delirium -Nevertheless, supplement B12 -Check RPR -Ammonia--14 -Urinalysis negative for pyuria -RBC folate--normal -12/18/2017 blood cultures negative -TSH 0.930 -VBG did not show hypercarbia -Personally reviewed EKG--sinus rhythm, no ST-T wave changes -Repeat MRI 12/19/2017 -Continue empiric IV antibiotics and IV acyclovir for now pending lumbar puncture  Diabetes mellitus type 2 -CBGs controlled during the hospitalization -12/15/2017 hemoglobin A1c 7.3 -NovoLog sliding scale -holding metformin  Essential hypertension -Restart amlodipine and losartan once the patient is more alert  Hyperlipidemia -Restart Lipitor  Coronary artery disease -No chest pain presently -Personally reviewed EKG--sinus rhythm, no ST-T wave change    Disposition Plan:   Home in 1-2 days  Family Communication:   Spouse updated 12/10  Consultants:  none  Code Status:  FULL   DVT Prophylaxis:  SCDs   Procedures: As Listed in Progress Note Above  Antibiotics: vanco 12/8>>> Ampicillin 12/8>>> Ceftriaxone  12/8>>> Acyclovir 12/8>>>  Total time spent 35 minutes.  Greater than 50% spent face to face counseling and coordinating care.     Subjective: Patient denies fevers, chills, headache, chest pain, dyspnea, nausea, vomiting, diarrhea, abdominal pain, dysuria, hematuria, hematochezia, and melena.    Objective: Vitals:   12/19/17 0500 12/19/17 1551 12/19/17 2253 12/20/17 0420  BP: (!) 143/74 (!) 156/102 (!) 173/79 (!) 168/74  Pulse: 82 79 71 67  Resp: (!) 22 18    Temp: 98.5 F (36.9 C) 98 F (36.7 C) 97.8 F (36.6 C) 98.2 F (36.8 C)  TempSrc: Axillary  Oral Oral  SpO2: 92% 98% 95% 96%  Weight:      Height:        Intake/Output Summary (Last 24 hours) at 12/20/2017 0807 Last data filed at 12/20/2017 0700 Gross per 24 hour  Intake 2785.19 ml  Output 400 ml  Net 2385.19 ml   Weight change:  Exam:   General:  Pt is alert, follows commands appropriately, not in acute distress  HEENT: No icterus, No thrush, No neck mass, [place]/AT  Cardiovascular: RRR, S1/S2, no rubs, no gallops  Respiratory: CTA bilaterally, no wheezing, no crackles, no rhonchi  Abdomen: Soft/+BS, non tender, non distended, no guarding  Extremities: No edema, No lymphangitis, No petechiae, No rashes, no synovitis  Neuro:  CN II-XII intact, strength 4/5 in RUE, RLE, strength 4/5 LUE, LLE; sensation intact bilateral; no dysmetria; babinski equivocal     Data Reviewed: I have personally reviewed following labs and imaging studies Basic Metabolic Panel: Recent Labs  Lab 12/14/17 1857 12/14/17 1904 12/18/17 1104 12/18/17 2145 12/19/17 0606  NA 136 138 135  --  135  K 3.5 3.6 3.6  --  3.5  CL 104 103 101  --  100  CO2 24  --  25  --  24  GLUCOSE 73 68* 112*  --  194*  BUN 17 17 15   --  13  CREATININE 0.92 0.90 0.94  --  0.85  CALCIUM 9.0  --  8.8*  --  8.5*  MG  --   --   --  1.9  --    Liver Function Tests: Recent Labs  Lab 12/14/17 1857 12/18/17 1104  AST 16 23  ALT 19 23  ALKPHOS  65 64  BILITOT 0.4 1.0  PROT 7.4 7.7  ALBUMIN 3.8 4.0   No results for input(s): LIPASE, AMYLASE in the last 168 hours. Recent Labs  Lab 12/18/17 1246  AMMONIA 14   Coagulation Profile: Recent Labs  Lab 12/14/17 1857 12/18/17 1104  INR 0.94 0.99   CBC: Recent Labs  Lab 12/14/17 1857 12/14/17 1904 12/15/17 0552 12/18/17 1104 12/19/17 0606  WBC 7.8  --   --  8.0 8.2  NEUTROABS 4.4  --   --  5.2  --   HGB 14.1 14.6  --  15.5 15.4  HCT 45.1 43.0 41.8 49.0 47.8  MCV 90.2  --   --  89.9 91.0  PLT 285  --   --  287 314   Cardiac Enzymes: Recent Labs  Lab 12/18/17 1104  TROPONINI <0.03   BNP: Invalid input(s): POCBNP CBG: Recent Labs  Lab 12/19/17 1142 12/19/17 1651 12/19/17 2044 12/20/17 0151 12/20/17 0421  GLUCAP 143* 153* 137* 181* 172*   HbA1C: No results for input(s): HGBA1C in the last 72 hours. Urine analysis:  Component Value Date/Time   COLORURINE YELLOW 12/18/2017 1104   APPEARANCEUR CLEAR 12/18/2017 1104   LABSPEC 1.018 12/18/2017 1104   PHURINE 5.0 12/18/2017 1104   GLUCOSEU NEGATIVE 12/18/2017 1104   HGBUR SMALL (A) 12/18/2017 1104   BILIRUBINUR NEGATIVE 12/18/2017 1104   BILIRUBINUR neg 12/10/2014 1435   KETONESUR NEGATIVE 12/18/2017 1104   PROTEINUR 30 (A) 12/18/2017 1104   UROBILINOGEN negative 12/10/2014 1435   UROBILINOGEN 0.2 12/09/2008 1006   NITRITE NEGATIVE 12/18/2017 1104   LEUKOCYTESUR NEGATIVE 12/18/2017 1104   Sepsis Labs: @LABRCNTIP (procalcitonin:4,lacticidven:4) ) Recent Results (from the past 240 hour(s))  Urine culture     Status: None   Collection Time: 12/18/17 11:05 AM  Result Value Ref Range Status   Specimen Description   Final    URINE, CLEAN CATCH Performed at [facility], [address], [place], [place] [address]    Special Requests   Final    NONE Performed at [facility], [address] [place], [place] [address]    Culture   Final    NO GROWTH Performed at [facility], [place] [address], [place], [place] [address]    Report Status 12/20/2017 FINAL  Final  Culture, blood (routine x 2)     Status: None (Preliminary result)   Collection Time: 12/18/17 12:46 PM  Result Value Ref Range Status   Specimen Description BLOOD LEFT ARM  Final   Special Requests   Final    BOTTLES DRAWN AEROBIC AND ANAEROBIC Blood Culture adequate volume   Culture   Final    NO GROWTH 2 DAYS Performed at [facility], [address], [place], [place] [address]    Report Status PENDING  Incomplete  Culture, blood (routine x 2)     Status: None (Preliminary result)   Collection Time: 12/18/17 12:46 PM  Result Value Ref Range Status   Specimen Description BLOOD RIGHT HAND  Final   Special Requests   Final    BOTTLES DRAWN AEROBIC AND ANAEROBIC Blood Culture adequate volume   Culture   Final    NO GROWTH 2 DAYS Performed at [facility], [address] [place], [place] [address]    Report Status PENDING  Incomplete     Scheduled Meds: . cyanocobalamin  1,000 mcg Intramuscular Daily  . insulin aspart  0-15 Units Subcutaneous Q4H   Continuous Infusions: . acyclovir 640 mg (12/20/17 0621)  . ampicillin (OMNIPEN) IV 2 g (12/20/17 0805)  . cefTRIAXone (ROCEPHIN)  IV Stopped (12/19/17 2235)  . vancomycin Stopped (12/19/17 2318)  . vancomycin      Procedures/Studies: Ct Angio Head W Or Wo Contrast  Result Date: 12/14/2017 CLINICAL DATA:  77 y/o  M; right facial droop. EXAM: CT ANGIOGRAPHY HEAD AND NECK CT PERFUSION BRAIN TECHNIQUE: Multidetector CT imaging of the head and neck was performed using the standard protocol during bolus administration of intravenous contrast. Multiplanar CT image reconstructions and MIPs were obtained to evaluate the vascular anatomy. Carotid stenosis measurements (when applicable) are obtained utilizing NASCET criteria, using the distal internal carotid diameter as the denominator. Multiphase CT imaging of the brain was performed following IV bolus contrast injection.  Subsequent parametric perfusion maps were calculated using RAPID software. CONTRAST:  146mL ISOVUE-370 IOPAMIDOL (ISOVUE-370) INJECTION 76% COMPARISON:  12/14/2017 CT head. FINDINGS: CTA NECK FINDINGS Aortic arch: Standard branching. Imaged portion shows no evidence of dissection. No significant stenosis of the major arch vessel origins. 4 cm ascending aorta. Calcific atherosclerosis. Right carotid system: No evidence of  dissection, stenosis (50% or greater) or occlusion. Calcific atherosclerosis of the carotid bifurcation with mild less than 50% proximal ICA stenosis. Left carotid system: No evidence of dissection, stenosis (50% or greater) or occlusion. Calcific atherosclerosis of the carotid bifurcation with mild less than 50% proximal ICA stenosis. Vertebral arteries: Left dominant. No evidence of dissection, stenosis (50% or greater) or occlusion. Skeleton: Moderate cervical spondylosis with multilevel disc and facet degenerative changes greatest at the C5-C7 levels. C4-5 grade 1 anterolisthesis. Uncovertebral and facet hypertrophy results in right-sided bony foraminal encroachment at C5-C7 and left-sided bony foraminal encroachment at C4-C7. Multifactorial moderate to severe spinal canal stenosis at C5-6. Other neck: Negative. Upper chest: Severe coronary artery calcific atherosclerosis, partially visualized. Mildly patulous thoracic esophagus. Review of the MIP images confirms the above findings CTA HEAD FINDINGS Anterior circulation: No significant stenosis, proximal occlusion, aneurysm, or vascular malformation. Non stenotic calcific atherosclerosis of the carotid siphons. Posterior circulation: No significant stenosis, proximal occlusion, aneurysm, or vascular malformation. Non stenotic calcific atherosclerosis of the bilateral vertebral arteries. Venous sinuses: As permitted by contrast timing, patent. Anatomic variants: Fetal right PCA. Review of the MIP images confirms the above findings CT Brain  Perfusion Findings: CBF (<30%) Volume: 53mL Perfusion (Tmax>6.0s) volume: 34mL Mismatch Volume: 69mL Infarction Location:No acute stroke by perfusion criteria. Increased T-max corresponds to the left parieto-occipital junction, however, there is translational motion artifact on the CT perfusion post processing misregistration artifact. The motion is continuous throughout the scan. IMPRESSION: CTA neck: 1. Patent carotid and vertebral arteries. No dissection, aneurysm, or hemodynamically significant stenosis by NASCET criteria. 2. 4 cm ascending aortic aneurysm. Recommend annual imaging followup by CTA or MRA. This recommendation follows 2010 ACCF/AHA/AATS/ACR/ASA/SCA/SCAI/SIR/STS/SVM Guidelines for the Diagnosis and Management of Patients with Thoracic Aortic Disease. 2010; 121: F643-P295. 3. Calcific atherosclerosis of bilateral carotid bifurcations with mild less than 50% proximal ICA stenosis bilaterally. 4. Advanced cervical spondylosis with multifactorial moderate to severe C5-6 spinal canal stenosis. CTA head: Patent anterior and posterior intracranial circulation. No large vessel occlusion, aneurysm, or significant stenosis. CT brain perfusion: Motion artifact resulting post processing misregistration. Small areas of perfusion anomaly may be obscured. No gross large vascular territory acute infarct by perfusion criteria. Electronically Signed   By: Kristine Garbe M.D.   On: 12/14/2017 20:38   Dg Chest 1 View  Result Date: 12/18/2017 CLINICAL DATA:  Altered mental status. EXAM: CHEST  1 VIEW COMPARISON:  12/14/2017 FINDINGS: There is mild left basilar airspace disease likely reflecting atelectasis. There is no other areas of focal consolidation. There is no pleural effusion or pneumothorax. The heart and mediastinal contours are unremarkable. The osseous structures are unremarkable. IMPRESSION: 1. No acute cardiopulmonary disease. Electronically Signed   By: Kathreen Devoid   On: 12/18/2017 12:27    Ct Head Wo Contrast  Result Date: 12/18/2017 CLINICAL DATA:  Confusion beginning yesterday.  Agitation. EXAM: CT HEAD WITHOUT CONTRAST TECHNIQUE: Contiguous axial images were obtained from the base of the skull through the vertex without intravenous contrast. COMPARISON:  CT 12/14/2017.  MRI 12/15/2017. FINDINGS: Brain: No change. Brain atrophy. Chronic small-vessel ischemic changes of the hemispheric white matter. No sign of acute infarction, mass lesion, hemorrhage, hydrocephalus or extra-axial collection. Vascular: There is atherosclerotic calcification of the major vessels at the base of the brain. Skull: Negative Sinuses/Orbits: Clear/normal Other: None IMPRESSION: No change. No acute finding. Atrophy and chronic small-vessel ischemic change. Electronically Signed   By: Nelson Chimes M.D.   On: 12/18/2017 12:29   Ct Angio Neck W  And/or Wo Contrast  Result Date: 12/14/2017 CLINICAL DATA:  77 y/o  M; right facial droop. EXAM: CT ANGIOGRAPHY HEAD AND NECK CT PERFUSION BRAIN TECHNIQUE: Multidetector CT imaging of the head and neck was performed using the standard protocol during bolus administration of intravenous contrast. Multiplanar CT image reconstructions and MIPs were obtained to evaluate the vascular anatomy. Carotid stenosis measurements (when applicable) are obtained utilizing NASCET criteria, using the distal internal carotid diameter as the denominator. Multiphase CT imaging of the brain was performed following IV bolus contrast injection. Subsequent parametric perfusion maps were calculated using RAPID software. CONTRAST:  188mL ISOVUE-370 IOPAMIDOL (ISOVUE-370) INJECTION 76% COMPARISON:  12/14/2017 CT head. FINDINGS: CTA NECK FINDINGS Aortic arch: Standard branching. Imaged portion shows no evidence of dissection. No significant stenosis of the major arch vessel origins. 4 cm ascending aorta. Calcific atherosclerosis. Right carotid system: No evidence of dissection, stenosis (50% or greater)  or occlusion. Calcific atherosclerosis of the carotid bifurcation with mild less than 50% proximal ICA stenosis. Left carotid system: No evidence of dissection, stenosis (50% or greater) or occlusion. Calcific atherosclerosis of the carotid bifurcation with mild less than 50% proximal ICA stenosis. Vertebral arteries: Left dominant. No evidence of dissection, stenosis (50% or greater) or occlusion. Skeleton: Moderate cervical spondylosis with multilevel disc and facet degenerative changes greatest at the C5-C7 levels. C4-5 grade 1 anterolisthesis. Uncovertebral and facet hypertrophy results in right-sided bony foraminal encroachment at C5-C7 and left-sided bony foraminal encroachment at C4-C7. Multifactorial moderate to severe spinal canal stenosis at C5-6. Other neck: Negative. Upper chest: Severe coronary artery calcific atherosclerosis, partially visualized. Mildly patulous thoracic esophagus. Review of the MIP images confirms the above findings CTA HEAD FINDINGS Anterior circulation: No significant stenosis, proximal occlusion, aneurysm, or vascular malformation. Non stenotic calcific atherosclerosis of the carotid siphons. Posterior circulation: No significant stenosis, proximal occlusion, aneurysm, or vascular malformation. Non stenotic calcific atherosclerosis of the bilateral vertebral arteries. Venous sinuses: As permitted by contrast timing, patent. Anatomic variants: Fetal right PCA. Review of the MIP images confirms the above findings CT Brain Perfusion Findings: CBF (<30%) Volume: 62mL Perfusion (Tmax>6.0s) volume: 55mL Mismatch Volume: 16mL Infarction Location:No acute stroke by perfusion criteria. Increased T-max corresponds to the left parieto-occipital junction, however, there is translational motion artifact on the CT perfusion post processing misregistration artifact. The motion is continuous throughout the scan. IMPRESSION: CTA neck: 1. Patent carotid and vertebral arteries. No dissection, aneurysm,  or hemodynamically significant stenosis by NASCET criteria. 2. 4 cm ascending aortic aneurysm. Recommend annual imaging followup by CTA or MRA. This recommendation follows 2010 ACCF/AHA/AATS/ACR/ASA/SCA/SCAI/SIR/STS/SVM Guidelines for the Diagnosis and Management of Patients with Thoracic Aortic Disease. 2010; 121: Y694-W546. 3. Calcific atherosclerosis of bilateral carotid bifurcations with mild less than 50% proximal ICA stenosis bilaterally. 4. Advanced cervical spondylosis with multifactorial moderate to severe C5-6 spinal canal stenosis. CTA head: Patent anterior and posterior intracranial circulation. No large vessel occlusion, aneurysm, or significant stenosis. CT brain perfusion: Motion artifact resulting post processing misregistration. Small areas of perfusion anomaly may be obscured. No gross large vascular territory acute infarct by perfusion criteria. Electronically Signed   By: Kristine Garbe M.D.   On: 12/14/2017 20:38   Ct Chest W Contrast  Result Date: 12/18/2017 CLINICAL DATA:  Altered mental status, dizziness, low-grade fever EXAM: CT CHEST WITH CONTRAST TECHNIQUE: Multidetector CT imaging of the chest was performed during intravenous contrast administration. CONTRAST:  4mL ISOVUE-300 IOPAMIDOL (ISOVUE-300) INJECTION 61% COMPARISON:  Chest radiograph dated 12/18/2017 FINDINGS: Motion degraded images (patient and respiratory motion).  Cardiovascular: The heart is top-normal in size. No pericardial effusion. No evidence of thoracic aortic aneurysm. Atherosclerotic calcifications of the aortic arch. Three vessel coronary atherosclerosis. Mediastinum/Nodes: No suspicious mediastinal lymphadenopathy. Visualized thyroid is unremarkable. Lungs/Pleura: Lungs are motion degraded. Within that constraint, there are no suspicious pulmonary nodules. No focal consolidation. Minimal dependent atelectasis in the bilateral lower lobes. Trace paraseptal emphysematous changes in the bilateral upper  lobes. No pleural effusion or pneumothorax. Upper Abdomen: Visualized upper abdomen is notable for prior cholecystectomy. Musculoskeletal: Degenerative changes of the visualized thoracolumbar spine. IMPRESSION: Motion degraded images. No evidence of acute cardiopulmonary disease. Aortic Atherosclerosis (ICD10-I70.0) and Emphysema (ICD10-J43.9). Electronically Signed   By: Julian Hy M.D.   On: 12/18/2017 18:59   Mr Brain Wo Contrast  Result Date: 12/19/2017 CLINICAL DATA:  Altered mental status. History of hypertension, diabetes. EXAM: MRI HEAD WITHOUT CONTRAST TECHNIQUE: Sagittal T1, axial and coronal diffusion weighted imaging and axial T2 sequences obtained on a 1.5 tesla scanner. Patient could not tolerate further imaging per technologist note. COMPARISON:  CT HEAD December and MRI brain December 2019. FINDINGS: INTRACRANIAL CONTENTS: No reduced diffusion to suggest acute ischemia. No midline shift or mass effect. Patchy to confluent supratentorial and pontine white matter FLAIR T2 hyperintensities. Moderate parenchymal brain volume loss. Mild sulcal effacement at the convexities. No abnormal extra-axial fluid collections. VASCULAR: Normal major intracranial vascular flow voids present at skull base. SKULL AND UPPER CERVICAL SPINE: No abnormal sellar expansion. No suspicious calvarial bone marrow signal. Craniocervical junction maintained. SINUSES/ORBITS: RIGHT mastoid effusion. Mild paranasal sinus mucosal thickening. Status post RIGHT ocular lens implant.The included ocular globes and orbital contents are non-suspicious. OTHER: None. IMPRESSION: 1. Limited 3 sequence MRI of the head: No acute intracranial process. 2. Moderate parenchymal brain volume with superimposed image findings normal pressure hydrocephalus. 3. Moderate parenchymal brain volume loss. Electronically Signed   By: Elon Alas M.D.   On: 12/19/2017 13:35   Mr Brain Wo Contrast  Result Date: 12/15/2017 CLINICAL DATA:   Right facial droop presenting yesterday. EXAM: MRI HEAD WITHOUT CONTRAST TECHNIQUE: Multiplanar, multiecho pulse sequences of the brain and surrounding structures were obtained without intravenous contrast. COMPARISON:  CT studies 12/24/2017 FINDINGS: Brain: Diffusion imaging does not show any acute or subacute infarction, with specific attention to the left parietal region where there was a borderline perfusion abnormality. Chronic small-vessel changes of the hemispheric white matter are seen, probably unchanged from previous studies. Vascular: Major vessels at the base of the brain show flow. Skull and upper cervical spine: Negative Sinuses/Orbits: No sinus disease seen. Other: None IMPRESSION: Negative diffusion imaging. Specifically, no infarction in the left parietal region. Electronically Signed   By: Nelson Chimes M.D.   On: 12/15/2017 07:29   Ct Cerebral Perfusion W Contrast  Result Date: 12/14/2017 CLINICAL DATA:  77 y/o  M; right facial droop. EXAM: CT ANGIOGRAPHY HEAD AND NECK CT PERFUSION BRAIN TECHNIQUE: Multidetector CT imaging of the head and neck was performed using the standard protocol during bolus administration of intravenous contrast. Multiplanar CT image reconstructions and MIPs were obtained to evaluate the vascular anatomy. Carotid stenosis measurements (when applicable) are obtained utilizing NASCET criteria, using the distal internal carotid diameter as the denominator. Multiphase CT imaging of the brain was performed following IV bolus contrast injection. Subsequent parametric perfusion maps were calculated using RAPID software. CONTRAST:  177mL ISOVUE-370 IOPAMIDOL (ISOVUE-370) INJECTION 76% COMPARISON:  12/14/2017 CT head. FINDINGS: CTA NECK FINDINGS Aortic arch: Standard branching. Imaged portion shows no evidence of dissection. No significant  stenosis of the major arch vessel origins. 4 cm ascending aorta. Calcific atherosclerosis. Right carotid system: No evidence of dissection,  stenosis (50% or greater) or occlusion. Calcific atherosclerosis of the carotid bifurcation with mild less than 50% proximal ICA stenosis. Left carotid system: No evidence of dissection, stenosis (50% or greater) or occlusion. Calcific atherosclerosis of the carotid bifurcation with mild less than 50% proximal ICA stenosis. Vertebral arteries: Left dominant. No evidence of dissection, stenosis (50% or greater) or occlusion. Skeleton: Moderate cervical spondylosis with multilevel disc and facet degenerative changes greatest at the C5-C7 levels. C4-5 grade 1 anterolisthesis. Uncovertebral and facet hypertrophy results in right-sided bony foraminal encroachment at C5-C7 and left-sided bony foraminal encroachment at C4-C7. Multifactorial moderate to severe spinal canal stenosis at C5-6. Other neck: Negative. Upper chest: Severe coronary artery calcific atherosclerosis, partially visualized. Mildly patulous thoracic esophagus. Review of the MIP images confirms the above findings CTA HEAD FINDINGS Anterior circulation: No significant stenosis, proximal occlusion, aneurysm, or vascular malformation. Non stenotic calcific atherosclerosis of the carotid siphons. Posterior circulation: No significant stenosis, proximal occlusion, aneurysm, or vascular malformation. Non stenotic calcific atherosclerosis of the bilateral vertebral arteries. Venous sinuses: As permitted by contrast timing, patent. Anatomic variants: Fetal right PCA. Review of the MIP images confirms the above findings CT Brain Perfusion Findings: CBF (<30%) Volume: 70mL Perfusion (Tmax>6.0s) volume: 6mL Mismatch Volume: 41mL Infarction Location:No acute stroke by perfusion criteria. Increased T-max corresponds to the left parieto-occipital junction, however, there is translational motion artifact on the CT perfusion post processing misregistration artifact. The motion is continuous throughout the scan. IMPRESSION: CTA neck: 1. Patent carotid and vertebral  arteries. No dissection, aneurysm, or hemodynamically significant stenosis by NASCET criteria. 2. 4 cm ascending aortic aneurysm. Recommend annual imaging followup by CTA or MRA. This recommendation follows 2010 ACCF/AHA/AATS/ACR/ASA/SCA/SCAI/SIR/STS/SVM Guidelines for the Diagnosis and Management of Patients with Thoracic Aortic Disease. 2010; 121: M086-P619. 3. Calcific atherosclerosis of bilateral carotid bifurcations with mild less than 50% proximal ICA stenosis bilaterally. 4. Advanced cervical spondylosis with multifactorial moderate to severe C5-6 spinal canal stenosis. CTA head: Patent anterior and posterior intracranial circulation. No large vessel occlusion, aneurysm, or significant stenosis. CT brain perfusion: Motion artifact resulting post processing misregistration. Small areas of perfusion anomaly may be obscured. No gross large vascular territory acute infarct by perfusion criteria. Electronically Signed   By: Kristine Garbe M.D.   On: 12/14/2017 20:38   Dg Chest Port 1 View  Result Date: 12/15/2017 CLINICAL DATA:  Altered mental status. EXAM: PORTABLE CHEST 1 VIEW COMPARISON:  Chest radiograph May 31, 2012 FINDINGS: Cardiac silhouette is mildly enlarged, mediastinal is unremarkable for this low inspiratory examination with crowded vasculature markings. No focal consolidation. Small pleural effusions. Trachea projects midline and there is no pneumothorax. Included soft tissue planes and osseous structures are non-suspicious. Widened RIGHT AC joint may be postoperative. IMPRESSION: Low inspiratory examination. Mild cardiomegaly. Small pleural effusions. Electronically Signed   By: Elon Alas M.D.   On: 12/15/2017 00:18   Ct Head Code Stroke Wo Contrast`  Result Date: 12/14/2017 CLINICAL DATA:  Code stroke. 77 y/o M; right-sided facial droop and slurred speech since 6 p.m. EXAM: CT HEAD WITHOUT CONTRAST TECHNIQUE: Contiguous axial images were obtained from the base of the skull  through the vertex without intravenous contrast. COMPARISON:  12/06/2014 MRI head and CT head. FINDINGS: Brain: No evidence of acute infarction, hemorrhage, hydrocephalus, extra-axial collection or mass lesion/mass effect. Chronic microvascular ischemic changes and volume loss of the brain. Vascular: Calcific atherosclerosis of  carotid siphons. No hyperdense vessel identified. Skull: Normal. Negative for fracture or focal lesion. Sinuses/Orbits: Right mastoid opacification. Additional visible paranasal sinuses and mastoid air cells are otherwise normally aerated. Right intra-ocular lens replacement Other: None. ASPECTS [facility] Stroke Program Early CT Score) - Ganglionic level infarction (caudate, lentiform nuclei, internal capsule, insula, M1-M3 cortex): 7 - Supraganglionic infarction (M4-M6 cortex): 3 Total score (0-10 with 10 being normal): 10 IMPRESSION: 1. No acute intracranial abnormality identified. 2. ASPECTS is 10. 3. Stable chronic microvascular ischemic changes and volume loss of the brain. 4. Right mastoid opacification. These results were called by telephone at the time of interpretation on 12/14/2017 at 6:58 pm to Dr. Virgel Manifold , who verbally acknowledged these results. Electronically Signed   By: Kristine Garbe M.D.   On: 12/14/2017 19:02    Orson Eva, DO  [facility] Pager [phone]  If 7PM-7AM, please contact night-coverage [email or web address] Password [email or web address] 12/20/2017, 8:07 AM   LOS: 2 days

## 2017-12-20 NOTE — Progress Notes (Signed)
CRITICAL VALUE ALERT  Critical Value:  CSF White Cell 275  Date & Time Notied:  12/20/2017 1639  Provider Notified: Tat, MD  Orders Received/Actions taken: awaiting further instructions

## 2017-12-21 ENCOUNTER — Encounter (HOSPITAL_COMMUNITY): Payer: Self-pay

## 2017-12-21 DIAGNOSIS — N401 Enlarged prostate with lower urinary tract symptoms: Secondary | ICD-10-CM

## 2017-12-21 DIAGNOSIS — I10 Essential (primary) hypertension: Secondary | ICD-10-CM

## 2017-12-21 DIAGNOSIS — Z794 Long term (current) use of insulin: Secondary | ICD-10-CM

## 2017-12-21 DIAGNOSIS — E1159 Type 2 diabetes mellitus with other circulatory complications: Secondary | ICD-10-CM

## 2017-12-21 DIAGNOSIS — R4182 Altered mental status, unspecified: Secondary | ICD-10-CM

## 2017-12-21 DIAGNOSIS — R351 Nocturia: Secondary | ICD-10-CM

## 2017-12-21 DIAGNOSIS — G9341 Metabolic encephalopathy: Principal | ICD-10-CM

## 2017-12-21 DIAGNOSIS — G030 Nonpyogenic meningitis: Secondary | ICD-10-CM

## 2017-12-21 LAB — GLUCOSE, CAPILLARY
Glucose-Capillary: 169 mg/dL — ABNORMAL HIGH (ref 70–99)
Glucose-Capillary: 159 mg/dL — ABNORMAL HIGH (ref 70–99)
Glucose-Capillary: 142 mg/dL — ABNORMAL HIGH (ref 70–99)
Glucose-Capillary: 101 mg/dL — ABNORMAL HIGH (ref 70–99)

## 2017-12-21 LAB — PATHOLOGIST SMEAR REVIEW

## 2017-12-21 LAB — HSV DNA BY PCR (REFERENCE LAB)
HSV 1 DNA: NEGATIVE
HSV 2 DNA: NEGATIVE

## 2017-12-21 LAB — VDRL, CSF: VDRL Quant, CSF: NONREACTIVE

## 2017-12-21 MED ORDER — LIDOCAINE VISCOUS HCL 2 % MT SOLN: 15.0000 mL | Freq: Once | OROMUCOSAL | Status: DC

## 2017-12-21 MED ORDER — ALUM & MAG HYDROXIDE-SIMETH 200-200-20 MG/5ML PO SUSP: 30.0000 mL | Freq: Once | ORAL | Status: DC

## 2017-12-21 MED ORDER — HYDRALAZINE HCL 20 MG/ML IJ SOLN
10.0000 mg | Freq: Three times a day (TID) | INTRAMUSCULAR | Status: DC | PRN
  Administered 2017-12-21: 10 mg via INTRAVENOUS
  Filled 2017-12-21: qty 1

## 2017-12-21 MED ORDER — POLYVINYL ALCOHOL 1.4 % OP SOLN
1.0000 [drp] | OPHTHALMIC | Status: DC | PRN
  Administered 2017-12-21 – 2017-12-23 (×3): 1 [drp] via OPHTHALMIC
  Filled 2017-12-21: qty 15

## 2017-12-21 MED ORDER — LORAZEPAM 2 MG/ML IJ SOLN
INTRAMUSCULAR | Status: AC
  Administered 2017-12-21: 1 mg via INTRAVENOUS
  Filled 2017-12-21: qty 1

## 2017-12-21 MED ORDER — LORAZEPAM 2 MG/ML IJ SOLN
1.0000 mg | Freq: Once | INTRAMUSCULAR | Status: AC
  Administered 2017-12-21: 1 mg via INTRAVENOUS

## 2017-12-21 MED ORDER — QUETIAPINE FUMARATE 25 MG PO TABS
25.0000 mg | ORAL_TABLET | Freq: Every day | ORAL | Status: DC
  Administered 2017-12-21: 25 mg via ORAL
  Filled 2017-12-21: qty 1

## 2017-12-21 MED ORDER — AMLODIPINE BESYLATE 5 MG PO TABS
5.0000 mg | ORAL_TABLET | Freq: Every day | ORAL | Status: DC
  Administered 2017-12-21 – 2017-12-22 (×2): 5 mg via ORAL
  Filled 2017-12-21 (×2): qty 1

## 2017-12-21 NOTE — Progress Notes (Signed)
Patient disoriented to place, time, situation. Patient attempting to pull IV out and combative towards staff. MD notified. Orders received. Will continue Avasys for safety as well as staff at the bedside until patient calms from trying to get out of bed.

## 2017-12-21 NOTE — Progress Notes (Signed)
Pharmacy Antibiotic Note  [name] is a 77 y.o. male admitted on 12/18/2017 with meningitis.  Pharmacy has been consulted for acyclovir dosing. LP:WBC--275 (100% mononuclear cells; 0% polys) RBC--zero Glucose--78 Protein 97 -VDRL, cryptococcus, HSV--all pending Plan to deescalate antibiotics and continue acyclovir.  Plan: Continue acyclovir 640mg  IV q8h F/U cxs and clinical progress Monitor V/S, labs   Height: 5\' 6"  (167.6 cm) Weight: 217 lb 9.5 oz (98.7 kg) IBW/kg (Calculated) : 63.8  Temp (24hrs), Avg:98.3 F (36.8 C), Min:97.8 F (36.6 C), Max:98.6 F (37 C)  Recent Labs  Lab 12/14/17 1857 12/14/17 1904 12/18/17 1104 12/19/17 0606  WBC 7.8  --  8.0 8.2  CREATININE 0.92 0.90 0.94 0.85    Estimated Creatinine Clearance: 80.1 mL/min (by C-G formula based on SCr of 0.85 mg/dL).    Allergies  Allergen Reactions  . Ace Inhibitors Hives, Swelling and Rash    Rash,hives,tongue swelling  . Angiotensin Receptor Blockers     Unknown reaction   . Ciprofloxacin Other (See Comments)    confusion  . Other Hives and Other (See Comments)    Altaseptic, altase (cough)  . Oxycodone-Acetaminophen Other (See Comments)    REACTION: unknown reaction  . Ramipril Cough  . Robaxin [Methocarbamol] Other (See Comments)    Confusion   . Toradol [Ketorolac Tromethamine] Other (See Comments)    confusion  . Valium Other (See Comments)    Hallucinations; confusion  . Doxycycline Hives and Rash    Antimicrobials this admission: 12/8 acyclovir >>  12/8 vancomycin >> 12/11 12/8 ampicillin >>12/11 12/8 ceftriaxone>>12/11 Dose change: 12/9 Increase ceftriaxone to 2gm IV q12h Microbiology results: 12/8 BCx: ngtd 12/8 UCx: no growth 12/9 CSF Cx: gram stain negative, no growth so farThank you for allowing pharmacy to be a part of this patient's care.  Isac Sarna, BS Pharm D, [place] Clinical Pharmacist Pager [phone] 12/21/2017 11:17 AM

## 2017-12-21 NOTE — Progress Notes (Signed)
PROGRESS NOTE    [name]  HQI:[id number] DOB: 05/27/1940 DOA: 12/18/2017 PCP: Dettinger, Fransisca Kaufmann, MD   Brief Narrative:  [name] is a 77 y/o male with a PMH of diabetes mellitus type 2, hypertension, CAD, BPH, hyperlipidemia who presented with confusion. According to his wife, the patient was having low grade fevers at home for the past week. His wife states that the patient was showing increasing confusion on the evening of 12/17/17, this is when she decided to bring him to the hospital.   Assessment & Plan: 1.Acute metabolic encephalopathy -suspected that it is due to a viral type infection -Lumbar puncture was performed; CSF culture has had no growth so far -CSF glucose at 78, protein at 97 -cryptococcus antigen is negative -Waiting on HSV PCR, VDRL -Serum B12 at 167 -Continue vit B 12 supplementation -Ammonia at 14 -UA negative for pyuria -TSH at 0.93 -MRI on 12/9 showed no acute intracranial process. -D/C antibiotics -Continue IV acyclovir -will check Lyme titers/PCR  2.Diabetes mellitus type 2 -A1c at 7.3 -Continue monitoring CBGs -Will continue Novolog SSI -Will continue to hold metformin  -follow CBG's and adjust hypoglycemic regimen as needed.  3.Essential hypertension -Continue PRN labetalol -Continue PRN hydralazine -start norvasc  4.Hyperlipidemia -Continue Lipitor  5.Coronary artery disease -No chest pain presently -EKG- sinus rhythm, no ST-T wave change -will d/c telemetry   6.Obesity class 2 -Body mass index is 35.12 kg/m. -Patient was counseled on the risks of obesity on his overall health and the importance of weight loss.   7.Obstructive sleep apnea -recently diagnosed; the wife states that he was prescribed a CPAP machine but uses it irregularly.  -Will resume CPAP QHS -base on clinical response will check ABG   DVT prophylaxis: SCDs Code Status: Full Family Communication: Wife at bedside provided most of the  information Disposition Plan: Will discontinue antibiotics and continue Acyclovir. Patient is not ready for discharge at this moment due to ongoing AMS.  Consultants:   Neurology service  Procedures:   See below for X ray results  Antimicrobials:  Anti-infectives (From admission, onward)   Start     Dose/Rate Route Frequency Ordered Stop   12/19/17 1000  cefTRIAXone (ROCEPHIN) 2 g in sodium chloride 0.9 % 100 mL IVPB  Status:  Discontinued     2 g 200 mL/hr over 30 Minutes Intravenous Every 12 hours 12/19/17 0819 12/21/17 1011   12/19/17 0900  vancomycin (VANCOCIN) 1,250 mg in sodium chloride 0.9 % 250 mL IVPB  Status:  Discontinued     1,250 mg 166.7 mL/hr over 90 Minutes Intravenous Every 12 hours 12/18/17 2143 12/21/17 1114   12/18/17 2145  acyclovir (ZOVIRAX) 640 mg in dextrose 5 % 100 mL IVPB     10 mg/kg  63.8 kg (Ideal) 112.8 mL/hr over 60 Minutes Intravenous Every 8 hours 12/18/17 2143     12/18/17 2145  vancomycin (VANCOCIN) IVPB 1000 mg/200 mL premix     1,000 mg 200 mL/hr over 60 Minutes Intravenous  Once 12/18/17 2143 12/19/17 0615   12/18/17 2130  cefTRIAXone (ROCEPHIN) 2 g in sodium chloride 0.9 % 100 mL IVPB  Status:  Discontinued     2 g 200 mL/hr over 30 Minutes Intravenous Every 24 hours 12/18/17 2112 12/19/17 0819   12/18/17 2130  ampicillin (OMNIPEN) 2 g in sodium chloride 0.9 % 100 mL IVPB  Status:  Discontinued     2 g 300 mL/hr over 20 Minutes Intravenous Every 4 hours 12/18/17 2112 12/21/17 1011  12/18/17 1915  ampicillin (OMNIPEN) 2 g in sodium chloride 0.9 % 100 mL IVPB     2 g 300 mL/hr over 20 Minutes Intravenous  Once 12/18/17 1910 12/18/17 2100   12/18/17 1915  cefTRIAXone (ROCEPHIN) 2 g in sodium chloride 0.9 % 100 mL IVPB     2 g 200 mL/hr over 30 Minutes Intravenous  Once 12/18/17 1910 12/18/17 2024   12/18/17 1915  vancomycin (VANCOCIN) IVPB 1000 mg/200 mL premix  Status:  Discontinued     1,000 mg 200 mL/hr over 60 Minutes Intravenous  Once  12/18/17 1910 12/21/17 1011       Subjective: No acute complaints. No fevers. In no acute distress. Able to follow commands intermittently; insight remains impair and mentation not at baseline.   Objective: Vitals:   12/20/17 2203 12/21/17 0712 12/21/17 0800 12/21/17 1049  BP: (!) 160/75 (!) 168/74 (!) 169/108 (!) 180/69  Pulse: 76 74  62  Resp: 17 18 18 18   Temp: 98.4 F (36.9 C) 98.6 F (37 C) 98.2 F (36.8 C)   TempSrc: Oral Oral Oral   SpO2: 94% 96%  96%  Weight:      Height:        Intake/Output Summary (Last 24 hours) at 12/21/2017 1306 Last data filed at 12/21/2017 0900 Gross per 24 hour  Intake 2226.22 ml  Output 700 ml  Net 1526.22 ml   Filed Weights   12/18/17 1049  Weight: 98.7 kg    Examination: General exam: Alert, awake, intermittently oriented x 2. Somnolent. Still having impaired insight Respiratory system: Clear to auscultation. Respiratory effort normal. No wheezing, rhonchi or crackles.  Cardiovascular system:RRR. Systolic ejection murmur at the 2nd right intercostal space heard. No rubs or gallops. Gastrointestinal system: Abdomen is nondistended, soft and nontender. No organomegaly or masses felt. Normal bowel sounds heard. Central nervous system: Alert and oriented. No focal neurological deficits. Moving 4 limbs. Extremities: No cyanosis or clubbing. No edema. +pedal pulses Skin: No rashes, lesions or ulcers Psychiatry: Impaired insight. Mood & affect appropriate.   Data Reviewed: I have personally reviewed following labs and imaging studies  CBC: Recent Labs  Lab 12/14/17 1857 12/14/17 1904 12/15/17 0552 12/18/17 1104 12/19/17 0606  WBC 7.8  --   --  8.0 8.2  NEUTROABS 4.4  --   --  5.2  --   HGB 14.1 14.6  --  15.5 15.4  HCT 45.1 43.0 41.8 49.0 47.8  MCV 90.2  --   --  89.9 91.0  PLT 285  --   --  287 093   Basic Metabolic Panel: Recent Labs  Lab 12/14/17 1857 12/14/17 1904 12/18/17 1104 12/18/17 2145 12/19/17 0606  NA 136  138 135  --  135  K 3.5 3.6 3.6  --  3.5  CL 104 103 101  --  100  CO2 24  --  25  --  24  GLUCOSE 73 68* 112*  --  194*  BUN 17 17 15   --  13  CREATININE 0.92 0.90 0.94  --  0.85  CALCIUM 9.0  --  8.8*  --  8.5*  MG  --   --   --  1.9  --    GFR: Estimated Creatinine Clearance: 80.1 mL/min (by C-G formula based on SCr of 0.85 mg/dL).   Liver Function Tests: Recent Labs  Lab 12/14/17 1857 12/18/17 1104  AST 16 23  ALT 19 23  ALKPHOS 65 64  BILITOT 0.4 1.0  PROT 7.4 7.7  ALBUMIN 3.8 4.0   Recent Labs  Lab 12/18/17 1246  AMMONIA 14   Coagulation Profile: Recent Labs  Lab 12/14/17 1857 12/18/17 1104  INR 0.94 0.99   Cardiac Enzymes: Recent Labs  Lab 12/18/17 1104  TROPONINI <0.03   CBG: Recent Labs  Lab 12/20/17 1116 12/20/17 1622 12/20/17 2201 12/21/17 0806 12/21/17 1153  GLUCAP 149* 180* 181* 169* 159*   Urine analysis:    Component Value Date/Time   COLORURINE YELLOW 12/18/2017 [place] 12/18/2017 1104   LABSPEC 1.018 12/18/2017 1104   PHURINE 5.0 12/18/2017 1104   GLUCOSEU NEGATIVE 12/18/2017 1104   HGBUR SMALL (A) 12/18/2017 1104   BILIRUBINUR NEGATIVE 12/18/2017 1104   BILIRUBINUR neg 12/10/2014 [place] 12/18/2017 1104   PROTEINUR 30 (A) 12/18/2017 1104   UROBILINOGEN negative 12/10/2014 1435   UROBILINOGEN 0.2 12/09/2008 1006   NITRITE NEGATIVE 12/18/2017 1104   LEUKOCYTESUR NEGATIVE 12/18/2017 1104    Recent Results (from the past 240 hour(s))  Urine culture     Status: None   Collection Time: 12/18/17 11:05 AM  Result Value Ref Range Status   Specimen Description   Final    URINE, CLEAN CATCH Performed at [facility], [address], [place], [place] [address]    Special Requests   Final    NONE Performed at [facility], [address] [place], [place] [address]    Culture   Final    NO GROWTH Performed at [facility], [place] [address] [place], [place] [address]    Report Status  12/20/2017 FINAL  Final  Culture, blood (routine x 2)     Status: None (Preliminary result)   Collection Time: 12/18/17 12:46 PM  Result Value Ref Range Status   Specimen Description BLOOD LEFT ARM  Final   Special Requests   Final    BOTTLES DRAWN AEROBIC AND ANAEROBIC Blood Culture adequate volume   Culture   Final    NO GROWTH 2 DAYS Performed at [facility], [address], [place], [place] [address]    Report Status PENDING  Incomplete  Culture, blood (routine x 2)     Status: None (Preliminary result)   Collection Time: 12/18/17 12:46 PM  Result Value Ref Range Status   Specimen Description BLOOD RIGHT HAND  Final   Special Requests   Final    BOTTLES DRAWN AEROBIC AND ANAEROBIC Blood Culture adequate volume   Culture   Final    NO GROWTH 2 DAYS Performed at [facility], [address] [place], [place] [address]    Report Status PENDING  Incomplete  CSF culture     Status: None (Preliminary result)   Collection Time: 12/20/17  2:35 PM  Result Value Ref Range Status   Specimen Description   Final    CSF COLLECTED BY DOCTOR Performed at [facility], [address], [place], [place] [address]    Special Requests   Final    NONE Performed at [facility], [address] [place], [place] [address]    Gram Stain   Final    CYTOSPIN SMEAR WBC PRESENT, PREDOMINANTLY MONONUCLEAR NO ORGANISMS SEEN Performed at [facility] Performed at [facility], [address] [place], [place] [address]    Culture   Final    NO GROWTH < 12 HOURS Performed at [facility], [place] [address] [place], [place] [address]    Report Status PENDING  Incomplete  Radiology Studies: Dg Fluoro Guide Lumbar Puncture  Result Date: 12/20/2017 CLINICAL DATA:  Acute encephalopathy EXAM: DIAGNOSTIC LUMBAR PUNCTURE UNDER FLUOROSCOPIC GUIDANCE FLUOROSCOPY TIME:  Fluoroscopy Time: 0 minutes 18 seconds Radiation Exposure Index (if provided by the fluoroscopic device): 11.5 mGy Number of  Acquired Spot Images: 1 PROCEDURE: Procedure, benefits, and risks were discussed with the patient, including alternatives. Patient's questions were answered. Written informed consent was obtained. Timeout protocol followed. Patient placed in LEFT lateral decubitus position, unable to lie prone. L4-L5 disc space was localized under fluoroscopy. Skin prepped and draped in usual sterile fashion. Skin and soft tissues anesthetized with 3 mL of 1% lidocaine. 22 gauge needle was advanced into the spinal canal where clear colorless CSF was encountered with an opening pressure of 30 cm H2O (measured LLD). 14 mL of CSF was obtained in 4 tubes for requested analysis. Procedure tolerated very well by patient without immediate complication. IMPRESSION: Fluoroscopic guided lumbar puncture as above. Elevated opening pressure of 30 cm H2O. Electronically Signed   By: Lavonia Dana M.D.   On: 12/20/2017 15:30   Scheduled Meds: . cyanocobalamin  1,000 mcg Intramuscular Daily  . insulin aspart  0-15 Units Subcutaneous TID WC  . insulin aspart  0-5 Units Subcutaneous QHS   Continuous Infusions: . sodium chloride 250 mL (12/20/17 2052)  . acyclovir 640 mg (12/21/17 3202)     LOS: 3 days    Time spent: 30 minutes    Barton Dubois, MD [facility] Pager [phone]  If 7PM-7AM, please contact night-coverage [email or web address] Password [email or web address] 12/21/2017, 1:06 PM

## 2017-12-22 LAB — GLUCOSE, CAPILLARY
Glucose-Capillary: 162 mg/dL — ABNORMAL HIGH (ref 70–99)
Glucose-Capillary: 301 mg/dL — ABNORMAL HIGH (ref 70–99)
Glucose-Capillary: 125 mg/dL — ABNORMAL HIGH (ref 70–99)
Glucose-Capillary: 192 mg/dL — ABNORMAL HIGH (ref 70–99)

## 2017-12-22 MED ORDER — AMLODIPINE BESYLATE 5 MG PO TABS
10.0000 mg | ORAL_TABLET | Freq: Every day | ORAL | Status: DC
  Administered 2017-12-23: 10 mg via ORAL
  Filled 2017-12-22: qty 2

## 2017-12-22 MED ORDER — ACYCLOVIR SODIUM 50 MG/ML IV SOLN
INTRAVENOUS | Status: AC
  Filled 2017-12-22: qty 10

## 2017-12-22 MED ORDER — QUETIAPINE FUMARATE 25 MG PO TABS
25.0000 mg | ORAL_TABLET | Freq: Two times a day (BID) | ORAL | Status: DC
  Administered 2017-12-22 – 2017-12-23 (×2): 25 mg via ORAL
  Filled 2017-12-22 (×2): qty 1

## 2017-12-22 NOTE — Progress Notes (Signed)
Patient is refusing the use of CPAP at this time. Will continue to monitor as needed.

## 2017-12-22 NOTE — Progress Notes (Signed)
PROGRESS NOTE    [name]  SAY:[id number] DOB: Jan 14, 1940 DOA: 12/18/2017 PCP: Dettinger, Fransisca Kaufmann, MD   Brief Narrative:  [name] is a 77 y/o male with a PMH of diabetes mellitus type 2, hypertension, CAD, BPH, hyperlipidemia who presented with confusion. According to his wife, the patient was having low grade fevers at home for the past week. His wife states that the patient was showing increasing confusion on the evening of 12/17/17, this is when she decided to bring him to the hospital.   Assessment & Plan: 1.Acute metabolic encephalopathy -suspected that it is due to a viral type infection -Lumbar puncture was performed; CSF culture has had no growth so far -CSF glucose at 78, protein at 97 -cryptococcus antigen is negative -HSV PCR, VDRL; both came back negative -Serum B12 at 167 -Continue vit B 12 supplementation -Ammonia at 14 -UA negative for pyuria -TSH at 0.93 -MRI on 12/9 showed no acute intracranial process. -antibiotics have been discontinued -will d/c IV Acyclovir as HSV PCR came back negative, as mentioned above -Lyme titers/PCR are pending; waiting on results.  2.Diabetes mellitus type 2 -A1c at 7.3 -Continue monitoring CBGs -Will continue Novolog SSI -Will continue to hold metformin while inpatient  -Follow CBG's and adjust hypoglycemic regimen as needed.   3.Essential hypertension -Continue PRN labetalol -Continue PRN hydralazine -Will continue Norvasc, dose adjusted to 10mg  daily.  4.Hyperlipidemia -Will continue Lipitor  5.Coronary artery disease -No chest pain or SOB presently.  -EKG- sinus rhythm, no ST-T wave change -telemetry has been discontinued   6.Obesity class 2 -Body mass index is 35.12 kg/m. -Patient was counseled on the risks of obesity on his overall health and the importance of weight loss.   7.Obstructive sleep apnea -Recently diagnosed; the wife states that he was prescribed a CPAP machine but uses it irregularly.    -Continue CPAP QHS -monitor for ABG PRN based on clinical response   8.physical deconditioning -will ask for PT evaluation and will increase physical activity  DVT prophylaxis: SCDs Code Status: Full Family Communication: Wife at bedside.  Disposition Plan: Will discontinue Acyclovir as HSV PCR came back negative. Possible discharge in the next 24-48 hours.   Consultants:   Neurology  Procedures:   See below for X ray results  Antimicrobials:  Anti-infectives (From admission, onward)   Start     Dose/Rate Route Frequency Ordered Stop   12/19/17 1000  cefTRIAXone (ROCEPHIN) 2 g in sodium chloride 0.9 % 100 mL IVPB  Status:  Discontinued     2 g 200 mL/hr over 30 Minutes Intravenous Every 12 hours 12/19/17 0819 12/21/17 1011   12/19/17 0900  vancomycin (VANCOCIN) 1,250 mg in sodium chloride 0.9 % 250 mL IVPB  Status:  Discontinued     1,250 mg 166.7 mL/hr over 90 Minutes Intravenous Every 12 hours 12/18/17 2143 12/21/17 1114   12/18/17 2145  acyclovir (ZOVIRAX) 640 mg in dextrose 5 % 100 mL IVPB  Status:  Discontinued     10 mg/kg  63.8 kg (Ideal) 112.8 mL/hr over 60 Minutes Intravenous Every 8 hours 12/18/17 2143 12/22/17 0946   12/18/17 2145  vancomycin (VANCOCIN) IVPB 1000 mg/200 mL premix     1,000 mg 200 mL/hr over 60 Minutes Intravenous  Once 12/18/17 2143 12/19/17 0615   12/18/17 2130  cefTRIAXone (ROCEPHIN) 2 g in sodium chloride 0.9 % 100 mL IVPB  Status:  Discontinued     2 g 200 mL/hr over 30 Minutes Intravenous Every 24 hours 12/18/17 2112  12/19/17 0819   12/18/17 2130  ampicillin (OMNIPEN) 2 g in sodium chloride 0.9 % 100 mL IVPB  Status:  Discontinued     2 g 300 mL/hr over 20 Minutes Intravenous Every 4 hours 12/18/17 2112 12/21/17 1011   12/18/17 1915  ampicillin (OMNIPEN) 2 g in sodium chloride 0.9 % 100 mL IVPB     2 g 300 mL/hr over 20 Minutes Intravenous  Once 12/18/17 1910 12/18/17 2100   12/18/17 1915  cefTRIAXone (ROCEPHIN) 2 g in sodium chloride 0.9  % 100 mL IVPB     2 g 200 mL/hr over 30 Minutes Intravenous  Once 12/18/17 1910 12/18/17 2024   12/18/17 1915  vancomycin (VANCOCIN) IVPB 1000 mg/200 mL premix  Status:  Discontinued     1,000 mg 200 mL/hr over 60 Minutes Intravenous  Once 12/18/17 1910 12/21/17 1011       Subjective: Patient complains of itching, redness and swelling of his right eye.  No fevers, no chest pain, no shortness of breath. Answering questions and following commands. Insight much improved from yesterday. According to wife at bedside, better but no back to his baseline.  Objective: Vitals:   12/21/17 1200 12/21/17 1440 12/21/17 2111 12/22/17 0528  BP: (!) 189/84 (!) 184/78 (!) 172/85 119/66  Pulse: 68 67 72 71  Resp: 18 20 17 16   Temp: 98 F (36.7 C) 98.6 F (37 C) 97.7 F (36.5 C) 98.4 F (36.9 C)  TempSrc: Oral Oral Oral Oral  SpO2: 98% 98% 100% 100%  Weight:      Height:        Intake/Output Summary (Last 24 hours) at 12/22/2017 1117 Last data filed at 12/22/2017 0900 Gross per 24 hour  Intake 1803.71 ml  Output 800 ml  Net 1003.71 ml   Filed Weights   12/18/17 1049  Weight: 98.7 kg    Examination: General exam: Afebrile. Alert, awake, oriented x 3 currently. But having intermittent episodes of confusion. Demonstrating Overall improvement from yesterday. No chest pain or SOB. Respiratory system: Clear to auscultation. Respiratory effort normal. Cardiovascular system:RRR. Systolic ejection murmur at the second right intercostal space heard. No rubs, gallops. Gastrointestinal system: Abdomen is nondistended, soft and nontender. No organomegaly or masses felt. Normal bowel sounds heard. Central nervous system: Alert and oriented. No focal neurological deficits. Extremities: No cyanosis, clubbing or edema.  +pedal pulses Skin: No rashes, lesions or ulcers Psychiatry: Judgement and insight appear normal. Mood & affect appropriate.   Data Reviewed: I have personally reviewed following labs  and imaging studies  CBC: Recent Labs  Lab 12/18/17 1104 12/19/17 0606  WBC 8.0 8.2  NEUTROABS 5.2  --   HGB 15.5 15.4  HCT 49.0 47.8  MCV 89.9 91.0  PLT 287 485   Basic Metabolic Panel: Recent Labs  Lab 12/18/17 1104 12/18/17 2145 12/19/17 0606  NA 135  --  135  K 3.6  --  3.5  CL 101  --  100  CO2 25  --  24  GLUCOSE 112*  --  194*  BUN 15  --  13  CREATININE 0.94  --  0.85  CALCIUM 8.8*  --  8.5*  MG  --  1.9  --    GFR: Estimated Creatinine Clearance: 80.1 mL/min (by C-G formula based on SCr of 0.85 mg/dL).   Liver Function Tests: Recent Labs  Lab 12/18/17 1104  AST 23  ALT 23  ALKPHOS 64  BILITOT 1.0  PROT 7.7  ALBUMIN 4.0  Recent Labs  Lab 12/18/17 1246  AMMONIA 14   Coagulation Profile: Recent Labs  Lab 12/18/17 1104  INR 0.99   Cardiac Enzymes: Recent Labs  Lab 12/18/17 1104  TROPONINI <0.03   CBG: Recent Labs  Lab 12/21/17 1153 12/21/17 1648 12/21/17 2121 12/22/17 0730 12/22/17 1113  GLUCAP 159* 142* 101* 162* 301*   Urine analysis:    Component Value Date/Time   COLORURINE YELLOW 12/18/2017 1104   [place] 12/18/2017 1104   LABSPEC 1.018 12/18/2017 1104   PHURINE 5.0 12/18/2017 1104   GLUCOSEU NEGATIVE 12/18/2017 1104   HGBUR SMALL (A) 12/18/2017 1104   BILIRUBINUR NEGATIVE 12/18/2017 1104   BILIRUBINUR neg 12/10/2014 [place] 12/18/2017 1104   PROTEINUR 30 (A) 12/18/2017 1104   UROBILINOGEN negative 12/10/2014 1435   UROBILINOGEN 0.2 12/09/2008 1006   NITRITE NEGATIVE 12/18/2017 1104   LEUKOCYTESUR NEGATIVE 12/18/2017 1104    Recent Results (from the past 240 hour(s))  Urine culture     Status: None   Collection Time: 12/18/17 11:05 AM  Result Value Ref Range Status   Specimen Description   Final    URINE, CLEAN CATCH Performed at [facility], [address] [place], [place] [address]    Special Requests   Final    NONE Performed at [facility], [address] [place],  [place] [address]    Culture   Final    NO GROWTH Performed at [facility], [place] [address] [place], [place] [address]    Report Status 12/20/2017 FINAL  Final  Culture, blood (routine x 2)     Status: None (Preliminary result)   Collection Time: 12/18/17 12:46 PM  Result Value Ref Range Status   Specimen Description BLOOD LEFT ARM  Final   Special Requests   Final    BOTTLES DRAWN AEROBIC AND ANAEROBIC Blood Culture adequate volume   Culture   Final    NO GROWTH 4 DAYS Performed at [facility], [address], [place], [place] [address]    Report Status PENDING  Incomplete  Culture, blood (routine x 2)     Status: None (Preliminary result)   Collection Time: 12/18/17 12:46 PM  Result Value Ref Range Status   Specimen Description BLOOD RIGHT HAND  Final   Special Requests   Final    BOTTLES DRAWN AEROBIC AND ANAEROBIC Blood Culture adequate volume   Culture   Final    NO GROWTH 4 DAYS Performed at [facility], [address] [place], [place] [address]    Report Status PENDING  Incomplete  CSF culture     Status: None (Preliminary result)   Collection Time: 12/20/17  2:35 PM  Result Value Ref Range Status   Specimen Description   Final    CSF COLLECTED BY DOCTOR Performed at [facility], [address] [place], [place] [address]    Special Requests   Final    NONE Performed at [facility], [address], [place], [place] [address]    Gram Stain   Final    CYTOSPIN SMEAR WBC PRESENT, PREDOMINANTLY MONONUCLEAR NO ORGANISMS SEEN Performed at [facility] Performed at [facility], [address], [place], [place] [address]    Culture   Final    NO GROWTH 2 DAYS Performed at [facility], [place] [address], [place],  [address]    Report Status PENDING  Incomplete    Radiology Studies: Dg Fluoro Guide Lumbar Puncture  Result Date:  12/20/2017 CLINICAL DATA:  Acute encephalopathy EXAM: DIAGNOSTIC LUMBAR PUNCTURE UNDER FLUOROSCOPIC GUIDANCE FLUOROSCOPY  TIME:  Fluoroscopy Time: 0 minutes 18 seconds Radiation Exposure Index (if provided by the fluoroscopic device): 11.5 mGy Number of Acquired Spot Images: 1 PROCEDURE: Procedure, benefits, and risks were discussed with the patient, including alternatives. Patient's questions were answered. Written informed consent was obtained. Timeout protocol followed. Patient placed in LEFT lateral decubitus position, unable to lie prone. L4-L5 disc space was localized under fluoroscopy. Skin prepped and draped in usual sterile fashion. Skin and soft tissues anesthetized with 3 mL of 1% lidocaine. 22 gauge needle was advanced into the spinal canal where clear colorless CSF was encountered with an opening pressure of 30 cm H2O (measured LLD). 14 mL of CSF was obtained in 4 tubes for requested analysis. Procedure tolerated very well by patient without immediate complication. IMPRESSION: Fluoroscopic guided lumbar puncture as above. Elevated opening pressure of 30 cm H2O. Electronically Signed   By: Lavonia Dana M.D.   On: 12/20/2017 15:30   Scheduled Meds: . amLODipine  5 mg Oral Daily  . cyanocobalamin  1,000 mcg Intramuscular Daily  . insulin aspart  0-15 Units Subcutaneous TID WC  . insulin aspart  0-5 Units Subcutaneous QHS  . QUEtiapine  25 mg Oral QHS   Continuous Infusions: . sodium chloride 250 mL (12/20/17 2052)     LOS: 4 days    Time spent: 30 minutes    Barton Dubois, MD [facility] Pager [phone]  If 7PM-7AM, please contact night-coverage [email or web address] Password [email or web address] 12/22/2017, 11:17 AM

## 2017-12-23 ENCOUNTER — Ambulatory Visit: Payer: Medicare Other | Admitting: Family Medicine

## 2017-12-23 DIAGNOSIS — I2510 Atherosclerotic heart disease of native coronary artery without angina pectoris: Secondary | ICD-10-CM

## 2017-12-23 DIAGNOSIS — I10 Essential (primary) hypertension: Secondary | ICD-10-CM

## 2017-12-23 LAB — CBC
HCT: 48.9 % (ref 39.0–52.0)
Hemoglobin: 16.1 g/dL (ref 13.0–17.0)
MCH: 30.1 pg (ref 26.0–34.0)
MCHC: 32.9 g/dL (ref 30.0–36.0)
MCV: 91.4 fL (ref 80.0–100.0)
Platelets: 300 10*3/uL (ref 150–400)
RBC: 5.35 MIL/uL (ref 4.22–5.81)
RDW: 13.7 % (ref 11.5–15.5)
WBC: 8.2 10*3/uL (ref 4.0–10.5)
nRBC: 0 % (ref 0.0–0.2)

## 2017-12-23 LAB — BASIC METABOLIC PANEL
Anion gap: 11 (ref 5–15)
BUN: 18 mg/dL (ref 8–23)
CO2: 21 mmol/L — ABNORMAL LOW (ref 22–32)
CREATININE: 0.81 mg/dL (ref 0.61–1.24)
Calcium: 8.7 mg/dL — ABNORMAL LOW (ref 8.9–10.3)
Chloride: 104 mmol/L (ref 98–111)
GFR calc Af Amer: >60 mL/min (ref >60)
Glucose, Bld: 178 mg/dL — ABNORMAL HIGH (ref 70–99)
Potassium: 3.4 mmol/L — ABNORMAL LOW (ref 3.5–5.1)
Sodium: 136 mmol/L (ref 135–145)

## 2017-12-23 LAB — CULTURE, BLOOD (ROUTINE X 2)
Culture: NO GROWTH
Culture: NO GROWTH
Special Requests: ADEQUATE
Special Requests: ADEQUATE

## 2017-12-23 LAB — GLUCOSE, CAPILLARY
Glucose-Capillary: 177 mg/dL — ABNORMAL HIGH (ref 70–99)
Glucose-Capillary: 261 mg/dL — ABNORMAL HIGH (ref 70–99)

## 2017-12-23 MED ORDER — QUETIAPINE FUMARATE 25 MG PO TABS: 25.0000 mg | ORAL_TABLET | Freq: Two times a day (BID) | ORAL | 0 refills | Status: DC

## 2017-12-23 MED ORDER — PAROXETINE HCL 20 MG PO TABS: 10.0000 mg | ORAL_TABLET | Freq: Every day | ORAL | Status: DC

## 2017-12-23 MED ORDER — VITAMIN B-12 1000 MCG PO TABS: 2000.0000 ug | ORAL_TABLET | Freq: Every day | ORAL | 2 refills | Status: DC

## 2017-12-23 MED ORDER — AMLODIPINE BESYLATE 10 MG PO TABS: 10.0000 mg | ORAL_TABLET | Freq: Every day | ORAL | 1 refills | Status: DC

## 2017-12-23 MED ORDER — POLYVINYL ALCOHOL 1.4 % OP SOLN: 1.0000 [drp] | OPHTHALMIC | 0 refills | Status: DC | PRN

## 2017-12-23 NOTE — Progress Notes (Signed)
Discharge instructions reviewed with patient and wife.  Both verbalized understanding of instructions.  Patient discharged home with wife in stable condition.  

## 2017-12-23 NOTE — Evaluation (Signed)
Physical Therapy Evaluation Patient Details Name: [name] MRN: [id number] DOB: 09-03-1940 Today's Date: 12/23/2017   History of Present Illness  Patient is a 77 year old male admitted 12/18/2017 with confusion and diagnosis acute metabolic encephalopathy. PMH: CAD, DM, BPH, aortic atherosclerosis, HLD, HTN.    Clinical Impression  Patient confused during evaluation and kept falling asleep during subjective portion of eval despite being in the recliner for 30 minutes prior to my arrival. Wife reports he had not been out of bed since arrival to hospital 12/18/17. Patient unable to safely ambulate or stand without an assistive device. When attempting to ambulate without AD, patient reached for objects. Attempted ambulation with one hand held and patient was unsteady with very short steps and a narrow base of support. Attempted ambulation with a RW and patient required verbal cues to walk within RW in upright position vs. forward flexed trunk walking behind walker. Patient required multimodal cues to follow directions and extra time to process. PT feels patient would be unsafe in home environment with undue caregiver load due to patient's mental status and assistance needed not only for mobility but also for ADLs. Patient's wife states he normally gets up 5-6x/night to use the bathroom.  Patient would continue to benefit from skilled physical therapy in current environment and next venue to continue return to prior function and increase strength, endurance, balance, coordination, and functional mobility and gait skills.      Follow Up Recommendations SNF;Supervision/Assistance - 24 hour    Equipment Recommendations  None recommended by PT    Recommendations for Other Services       Precautions / Restrictions Precautions Precautions: Fall Precaution Comments: recent low back surgery early 2019 Restrictions Weight Bearing Restrictions: No      Mobility  Bed Mobility               General bed mobility comments: patient received in recliner and in recliner at end of session  Transfers Overall transfer level: Needs assistance Equipment used: None Transfers: Sit to/from Stand;Stand Pivot Transfers Sit to Stand: Min guard Stand pivot transfers: Min guard;Min assist       General transfer comment: slightly labored, unsteady but no loss of balance  Ambulation/Gait Ambulation/Gait assistance: Min assist;Min guard Gait Distance (Feet): 100 Feet Assistive device: 1 person hand held assist;Rolling walker (2 wheeled) Gait Pattern/deviations: Decreased step length - left;Decreased stance time - right;Decreased stride length;Trunk flexed Gait velocity: decreased   General Gait Details: slight antalgic gait on LLE due to chronic left hip pain; unsteady with 1 handheld, forward trunk flexed with RW with verbal cues to walk into walker not behind, no loss of balance with RW  Stairs            Wheelchair Mobility    Modified Rankin (Stroke Patients Only)       Balance Overall balance assessment: Needs assistance Sitting-balance support: Bilateral upper extremity supported;Feet supported Sitting balance-Leahy Scale: Fair Sitting balance - Comments: patient drowsy and kept falling asleep during subjective evaluation   Standing balance support: No upper extremity supported Standing balance-Leahy Scale: Poor Standing balance comment: fair/poor with 1 hand held; fair with RW                             Pertinent Vitals/Pain Pain Assessment: No/denies pain    Home Living Family/patient expects to be discharged to:: Private residence Living Arrangements: Spouse/significant other Available Help at Discharge: Family Type of  Home: House Home Access: Stairs to enter Entrance Stairs-Rails: Right Entrance Stairs-Number of Steps: 2-3 Home Layout: Two level;Able to live on main level with bedroom/bathroom Home Equipment: Gilford Rile - 2 wheels;Bedside  commode;Shower seat;Cane - single point;Hand held shower head      Prior Function Level of Independence: Independent         Comments: community ambulator, drives     Hand Dominance   Dominant Hand: Right    Extremity/Trunk Assessment   Upper Extremity Assessment Upper Extremity Assessment: Overall WFL for tasks assessed    Lower Extremity Assessment Lower Extremity Assessment: Generalized weakness RLE Deficits / Details: grossly 5/5 LLE Deficits / Details: grossly -4/5; states has had left hip pain recently, seeking care from MD    Cervical / Trunk Assessment Cervical / Trunk Assessment: Normal  Communication   Communication: HOH(right ear)  Cognition Arousal/Alertness: Awake/alert Behavior During Therapy: WFL for tasks assessed/performed Overall Cognitive Status: Impaired/Different from baseline                                        General Comments      Exercises     Assessment/Plan    PT Assessment Patient needs continued PT services  PT Problem List Decreased strength;Decreased mobility;Decreased safety awareness;Decreased activity tolerance;Decreased cognition;Decreased balance;Decreased knowledge of use of DME       PT Treatment Interventions Gait training;Functional mobility training;Therapeutic activities;Therapeutic exercise;Patient/family education;DME instruction;Balance training    PT Goals (Current goals can be found in the Care Plan section)  Acute Rehab PT Goals Patient Stated Goal: return home with spouse to assist; spouse not in agreement due to cognitive status PT Goal Formulation: With patient/family Time For Goal Achievement: 01/06/18 Potential to Achieve Goals: Good    Frequency Min 3X/week   Barriers to discharge        Co-evaluation               AM-PAC PT "6 Clicks" Mobility  Outcome Measure Help needed turning from your back to your side while in a flat bed without using bedrails?: A Little Help  needed moving from lying on your back to sitting on the side of a flat bed without using bedrails?: A Lot Help needed moving to and from a bed to a chair (including a wheelchair)?: A Little Help needed standing up from a chair using your arms (e.g., wheelchair or bedside chair)?: A Little Help needed to walk in hospital room?: A Little Help needed climbing 3-5 steps with a railing? : A Lot 6 Click Score: 16    End of Session Equipment Utilized During Treatment: Gait belt Activity Tolerance: Patient tolerated treatment well;Patient limited by lethargy Patient left: in chair;with family/visitor present;with call bell/phone within reach Nurse Communication: Mobility status PT Visit Diagnosis: Unsteadiness on feet (R26.81);Other abnormalities of gait and mobility (R26.89);Muscle weakness (generalized) (M62.81)    Time: 7026-3785 PT Time Calculation (min) (ACUTE ONLY): 30 min   Charges:   PT Evaluation $PT Eval Moderate Complexity: 1 Mod PT Treatments $Gait Training: 8-22 mins        Floria Raveling. Hartnett-Rands, MS, PT Per [place] [phone] 12/23/2017, 2:00 PM

## 2017-12-23 NOTE — Care Management Note (Signed)
Case Management Note  Patient Details  Name: [name] MRN: [id number] Date of Birth: 1940-04-26  Action/Plan: Refusing SNF pt would like to go home with [facility]. Options provided and pt/wife choose [facility]. Aware HH has 48 hrs to make first visit. Vaughan Basta, [facility] rep, aware of referral.   Expected Discharge Date:      12/23/17            Expected Discharge Plan:  [place]  In-House Referral:  NA  Discharge planning Services  CM Consult  Post Acute Care Choice:  Home Health Choice offered to:  Patient  HH Arranged:  RN, PT, Nurse's Aide [place] Agency:  [place]  Status of Service:  Completed, signed off  If discussed at [place] of Stay Meetings, dates discussed:    Additional Comments:  Sherald Barge, RN 12/23/2017, 4:18 PM

## 2017-12-23 NOTE — Care Management (Signed)
&lt;p id="[email or web address]"&gt; The page could not be loaded. The Medicare.gov Home page currently does not fully support browsers with &amp;quot;JavaScript&amp;quot; disabled. Please note that if you choose to continue without enabling &amp;quot;JavaScript&amp;quot; certain functionalities on this website may not be available. &lt;/p&gt;        Close window    Home health agencies that serve [phone]. Your favorite home health agencies  [place] of Patient Care Rating Patient Survey Summary Rating  [place]  [phone] 3 out of 5 stars 4 out of [place]  [phone] 3  out of 5 stars 4 out of [place]  [phone] 4 out of 5 stars 4 out of [place]  [phone] 3 out of 5 stars 5 out of [place]  [phone] 4  out of 5 stars 4 out of [place]  [phone] 4  out of 5 stars 4 out of [place]  [phone] 4  out of 5 stars 4 out of [place]  [phone] 4 out of 5 stars 4 out of [place] AGE  [phone] 3  out of 5 stars 4 out of 5 stars  [facility]  [phone] 4 out of 5 stars 4 out of [place]  [phone] 3 out of 5 stars 4 out of 5 stars  [facility]  [phone]) [phone] 2  out of 5 stars 3 out of [place]  [phone] 3  out of 5 stars 3 out of [place]  [phone] Not Available4 Not [place]  [phone] 5 out of 5 stars 3 out of [place] number Footnote as displayed on [place]  1 This agency provides services under a federal waiver program to  non-traditional, chronic long term population.  2 This agency provides services to a special needs population.  3 Not Available.  4 The number of patient episodes for this measure is too small to report.  5 This measure currently does not have data or provider has been certified/recertified for less than 6 months.  6 Medicare is not displaying rates for this measure for any home health agency, because of an issue with the data.  7 Medicare is not displaying rates for this measure for any home health agency, because of an issue with the data.  8 There were problems with the data and they are being corrected.  9 Zero, or very few, patients met the survey's rules for inclusion. The scores shown, if any, reflect a very small number of surveys and may not accurately tell how an agency is doing.  10 Survey results are based on less than 12 months of data.  11 Fewer than 70 patients completed the survey. Use the scores shown, if any, with caution as the number of surveys may be too low to accurately tell how an agency is doing.  12 No survey results are available for this period.  13 Data suppressed by CMS for one or more  quarters.

## 2017-12-23 NOTE — Care Management Important Message (Signed)
Important Message  Patient Details  Name: [name] MRN: [id number] Date of Birth: 09/10/1940   Medicare Important Message Given:  Yes    Antoni, Stefan 12/23/2017, 1:54 PM

## 2017-12-23 NOTE — Plan of Care (Signed)
  Problem: Acute Rehab PT Goals(only PT should resolve) Goal: Pt Will Go Supine/Side To Sit Outcome: Progressing Flowsheets (Taken 12/23/2017 1406) Pt will go Supine/Side to Sit: with min guard assist Goal: Pt Will Go Sit To Supine/Side Outcome: Progressing Flowsheets (Taken 12/23/2017 1406) Pt will go Sit to Supine/Side: with min guard assist Goal: Patient Will Transfer Sit To/From Stand Outcome: Progressing Flowsheets (Taken 12/23/2017 1406) Patient will transfer sit to/from stand: with min guard assist Note:  W/ LRAD Goal: Pt Will Transfer Bed To Chair/Chair To Bed Outcome: Progressing Flowsheets (Taken 12/23/2017 1406) Pt will Transfer Bed to Chair/Chair to Bed: min guard assist Note:  W/ LRAD Goal: Pt Will Ambulate Outcome: Progressing Flowsheets (Taken 12/23/2017 1406) Pt will Ambulate: 100 feet; with least restrictive assistive device; with min guard assist Goal: Pt/caregiver will Perform Home Exercise Program Outcome: Progressing Flowsheets (Taken 12/23/2017 1406) Pt/caregiver will Perform Home Exercise Program: For increased strengthening; For improved balance; With minimal assist   Floria Raveling. Hartnett-Rands, MS, PT Per [place] [phone] 12/23/2017

## 2017-12-23 NOTE — Discharge Summary (Signed)
Physician Discharge Summary  [name] BWI:[id number] DOB: 08-10-1940 DOA: 12/18/2017  PCP: Dettinger, Fransisca Kaufmann, MD  Admit date: 12/18/2017 Discharge date: 12/23/2017  Time spent: 35 minutes  Recommendations for Outpatient Follow-up:  1. Reassess BP and adjust antihypertensive regimen as needed 2. Follow B12 level 3. Follow final Lyme PCR results 4. Close follow up to blood glucose and adjust hypoglycemic regimen as needed 5. Assess patient compliance with CPAP and follow stability on his mentation. Will benefit of referral to psychiatry if needed.    Discharge Diagnoses:  Principal Problem:   Acute metabolic encephalopathy Active Problems:   CORONARY ATHEROSCLEROSIS NATIVE CORONARY ARTERY   Type 2 diabetes mellitus with circulatory disorder (HCC)   BPH (benign prostatic hyperplasia)   Aortic atherosclerosis (HCC)   Benign essential HTN   Discharge Condition: stable and improved. Discharge home with instructions to follow up with PCP in 10 days.  Diet recommendation: heart healthy, modified carb and low calorie diet   Filed Weights   12/18/17 1049  Weight: 98.7 kg    History of present illness:  77 y.o. male with medical history significant for DM, HTN, CAD, BPH, presented to the ED with confusion that started last night till today.  History is obtained from the spouse as patient is currently confused, though answers a few questions.  Spouse reports patient "talking out of his head", not making sense.  Also reports a fever of 100.4 yesterday.  Headaches for which she has treated with OTC Excedrin since recent hospital discharge. Spouse also reported some right eye redness and mild swelling, this morning, though she also admits both eyes are chronically mildly injected.  Patient denied eye pain.    Recent hospital admission 12/4 to 12/5-also with confusion, AMS. TIA/CVA was questioned as there was a report of facial droop, word finding difficulty, which had mostly  resolved by the time of ED arrival.  MRI brain done was negative.  Patient also had a headache on that admission, which resolved with conservative treatment.  The time of discharge patient was back to baseline.  MRI brain, CTA head and neck cerebral perfusion studies all negative.  B12 marginally low 167.  Patient did well, back to his baseline until yesterday evening when he became confused again, with a fever.  Symptoms of confusion have started in the evenings.  At baseline patient might intermittently forget a few things here and there but he has been fully functional without any significant memory deficits.  Spouse expressed concern, one third patient admitted, as she is afraid because of this new confusion and abnormal behavior, there are guns in the house and he could hurt himself or her  Hospital Course:  1.Acute metabolic encephalopathy -suspected that it is due to a viral type infection vs psychiatry abnormalities. -discharge on seroquel BID -Lumbar puncture was performed; CSF culture has had no growth so far. -CSF glucose at 78, protein at 97 -cryptococcus antigen is negative -HSV PCR, VDRL; both came back negative -Continue vit B 12 supplementation; follow level and if needed start Injection repletion. -Ammonia at 14 -UA negative for pyuria -TSH at 0.93 -MRI on 12/9 showed no acute intracranial process. -antibiotics discontinued as results demonstrated no bact meningitis or bact encephalitis.  -HSV PCR came back negative and acyclovir was discontinued as well. -Lyme titers/PCR are pending at discharge  2.Diabetes mellitus type 2 -A1c at 7.3 -Continue monitoring CBGs -Will resume home hypoglycemic regimen -Follow CBG's and further adjust tx as needed -modified carb diet encouraged  3.Essential hypertension -Will continue Norvasc, dose adjusted to 10mg  daily. -resume rest of antihypertensive regimen   4.Hyperlipidemia -Will continue Lipitor  5.Coronary artery  disease -No chest pain or SOB presently.  -EKG- sinus rhythm, no ST-T wave change -continue home meds.  6.Obesity class 2 -Body mass index is 35.12 kg/m. -Patient was counseled on the risks of obesity on his overall health and the importance of weight loss.   7.Obstructive sleep apnea -Recently diagnosed; the wife states that he was prescribed a CPAP machine but uses it irregularly.  -education about importance with CPAP compliance provided -patient expressed he will do his best to use it.  8.physical deconditioning -PT has seen him and recommended SNF -patient declined it  -discharge home with [facility] services instead.  Procedures:  See below for x-ray reports   LP  Consultations:  Neurology   Discharge Exam: Vitals:   12/23/17 0518 12/23/17 1258  BP: (!) 175/79 (!) 147/85  Pulse: 75 100  Resp: 18 18  Temp: 97.7 F (36.5 C) 97.7 F (36.5 C)  SpO2: 97% 98%   General exam: Afebrile. Alert, awake, oriented x 3 currently.  Demonstrating Overall improvement and stability to go home. No acute complaints.  Respiratory system: Clear to auscultation. Respiratory effort normal. Cardiovascular system:RRR. Systolic ejection murmur at the second right intercostal space heard. No rubs, gallops. Gastrointestinal system: Abdomen is nondistended, soft and nontender. No organomegaly or masses felt. Normal bowel sounds heard. Central nervous system: Alert and oriented. No focal neurological deficits. Extremities: No cyanosis, clubbing or edema.  +pedal pulses Skin: No rashes, lesions or ulcers Psychiatry: Judgement and insight appear normal. Mood & affect appropriate.    Discharge Instructions   Discharge Instructions    Diet - low sodium heart healthy   Complete by:  As directed    Discharge instructions   Complete by:  As directed    Take medications as prescribed  Use CPAP at bedtime every night  Please arrange follow up with PCP in 10 days Maintain adequate hydration      Allergies as of 12/23/2017      Reactions   Ace Inhibitors Hives, Swelling, Rash   Rash,hives,tongue swelling   Angiotensin Receptor Blockers    Unknown reaction    Ciprofloxacin Other (See Comments)   confusion   Other Hives, Other (See Comments)   Altaseptic, altase (cough)   Oxycodone-acetaminophen Other (See Comments)   REACTION: unknown reaction   Ramipril Cough   Robaxin [methocarbamol] Other (See Comments)   Confusion    Toradol [ketorolac Tromethamine] Other (See Comments)   confusion   Valium Other (See Comments)   Hallucinations; confusion   Doxycycline Hives, Rash      Medication List    STOP taking these medications   aspirin-acetaminophen-caffeine 250-250-65 MG tablet Commonly known as:  EXCEDRIN EXTRA STRENGTH   baclofen 10 MG tablet Commonly known as:  LIORESAL   HYDROcodone-acetaminophen 10-325 MG tablet Commonly known as:  NORCO     TAKE these medications   acetaminophen 650 MG CR tablet Commonly known as:  TYLENOL Take 1,300 mg by mouth 2 (two) times daily as needed for pain.   amLODipine 10 MG tablet Commonly known as:  NORVASC Take 1 tablet (10 mg total) by mouth daily. Start taking on:  December 24, 2017 What changed:    medication strength  how much to take   aspirin EC 81 MG tablet Take 81 mg by mouth every evening.   atorvastatin 20 MG tablet Commonly known as:  LIPITOR TAKE 1 TABLET BY MOUTH  DAILY AT 6 PM. What changed:  See the new instructions.   BLUE-EMU MAXIMUM STRENGTH EX Apply 1 application topically daily as needed (muscle pain).   butalbital-acetaminophen-caffeine 50-325-40 MG tablet Commonly known as:  FIORICET, ESGIC Take 1 tablet by mouth 2 (two) times daily as needed for headache.   cetirizine 10 MG tablet Commonly known as:  ZYRTEC Take 10 mg by mouth every evening.   docusate sodium 100 MG capsule Commonly known as:  COLACE Take 1 capsule (100 mg total) by mouth 2 (two) times daily.   famotidine 20 MG  tablet Commonly known as:  PEPCID TAKE 1 TABLET BY MOUTH TWO  TIMES DAILY   HUMALOG KWIKPEN 100 UNIT/ML KwikPen Generic drug:  insulin lispro INJECT 0 TO 15 UNITS  SUBCUTANEOUSLY 3 TIMES  DAILY BEFORE MEALS  (70-150=8 UNITS, 151-200=9  UNITS) What changed:  See the new instructions.   LANTUS SOLOSTAR 100 UNIT/ML Solostar Pen Generic drug:  Insulin Glargine INJECT SUBCUTANEOUSLY 52  UNITS AT BEDTIME What changed:  See the new instructions.   losartan 50 MG tablet Commonly known as:  COZAAR TAKE 1 TABLET BY MOUTH  DAILY What changed:  when to take this   metFORMIN 500 MG 24 hr tablet Commonly known as:  GLUCOPHAGE-XR TAKE 2 TABLETS BY MOUTH TWO TIMES DAILY What changed:  See the new instructions.   nitroGLYCERIN 0.4 MG SL tablet Commonly known as:  NITROSTAT DISSOLVE ONE TABLET UNDER THE TONGUE EVERY 5 MINUTES AS NEEDED FOR CHEST PAIN.  DO NOT EXCEED A TOTAL OF 3 DOSES IN 15 MINUTES What changed:    how much to take  how to take this  when to take this  reasons to take this   ONE TOUCH ULTRA TEST test strip Generic drug:  glucose blood USE 1 STRIP TO CHECK GLUCOSE 4 TIMES DAILY   PARoxetine 20 MG tablet Commonly known as:  PAXIL Take 0.5 tablets (10 mg total) by mouth daily. What changed:  how much to take   polyethylene glycol packet Commonly known as:  MIRALAX / GLYCOLAX Take 17 g by mouth daily. What changed:    when to take this  reasons to take this   polyvinyl alcohol 1.4 % ophthalmic solution Commonly known as:  LIQUIFILM TEARS Place 1 drop into both eyes as needed for dry eyes.   QUEtiapine 25 MG tablet Commonly known as:  SEROQUEL Take 1 tablet (25 mg total) by mouth 2 (two) times daily.   tamsulosin 0.4 MG Caps capsule Commonly known as:  FLOMAX TAKE 1 CAPSULE BY MOUTH  DAILY What changed:  when to take this   vitamin B-12 1000 MCG tablet Commonly known as:  CYANOCOBALAMIN Take 2 tablets (2,000 mcg total) by mouth daily.   Vitamin D3 50  MCG (2000 UT) Tabs Take 2,000 Units by mouth every morning.      Allergies  Allergen Reactions  . Ace Inhibitors Hives, Swelling and Rash    Rash,hives,tongue swelling  . Angiotensin Receptor Blockers     Unknown reaction   . Ciprofloxacin Other (See Comments)    confusion  . Other Hives and Other (See Comments)    Altaseptic, altase (cough)  . Oxycodone-Acetaminophen Other (See Comments)    REACTION: unknown reaction  . Ramipril Cough  . Robaxin [Methocarbamol] Other (See Comments)    Confusion   . Toradol [Ketorolac Tromethamine] Other (See Comments)    confusion  . Valium Other (See Comments)  Hallucinations; confusion  . Doxycycline Hives and Rash   Follow-up Information    Dettinger, Fransisca Kaufmann, MD. Schedule an appointment as soon as possible for a visit in 10 day(s).   Specialties:  Family Medicine, Cardiology Contact information: [place] [address] [phone]        Satira Sark, MD .   Specialty:  Cardiology Contact information: [place] [address] [phone]           The results of significant diagnostics from this hospitalization (including imaging, microbiology, ancillary and laboratory) are listed below for reference.    Significant Diagnostic Studies: Ct Angio Head W Or Wo Contrast  Result Date: 12/14/2017 CLINICAL DATA:  77 y/o  M; right facial droop. EXAM: CT ANGIOGRAPHY HEAD AND NECK CT PERFUSION BRAIN TECHNIQUE: Multidetector CT imaging of the head and neck was performed using the standard protocol during bolus administration of intravenous contrast. Multiplanar CT image reconstructions and MIPs were obtained to evaluate the vascular anatomy. Carotid stenosis measurements (when applicable) are obtained utilizing NASCET criteria, using the distal internal carotid diameter as the denominator. Multiphase CT imaging of the brain was performed following IV bolus contrast injection. Subsequent parametric  perfusion maps were calculated using RAPID software. CONTRAST:  171mL ISOVUE-370 IOPAMIDOL (ISOVUE-370) INJECTION 76% COMPARISON:  12/14/2017 CT head. FINDINGS: CTA NECK FINDINGS Aortic arch: Standard branching. Imaged portion shows no evidence of dissection. No significant stenosis of the major arch vessel origins. 4 cm ascending aorta. Calcific atherosclerosis. Right carotid system: No evidence of dissection, stenosis (50% or greater) or occlusion. Calcific atherosclerosis of the carotid bifurcation with mild less than 50% proximal ICA stenosis. Left carotid system: No evidence of dissection, stenosis (50% or greater) or occlusion. Calcific atherosclerosis of the carotid bifurcation with mild less than 50% proximal ICA stenosis. Vertebral arteries: Left dominant. No evidence of dissection, stenosis (50% or greater) or occlusion. Skeleton: Moderate cervical spondylosis with multilevel disc and facet degenerative changes greatest at the C5-C7 levels. C4-5 grade 1 anterolisthesis. Uncovertebral and facet hypertrophy results in right-sided bony foraminal encroachment at C5-C7 and left-sided bony foraminal encroachment at C4-C7. Multifactorial moderate to severe spinal canal stenosis at C5-6. Other neck: Negative. Upper chest: Severe coronary artery calcific atherosclerosis, partially visualized. Mildly patulous thoracic esophagus. Review of the MIP images confirms the above findings CTA HEAD FINDINGS Anterior circulation: No significant stenosis, proximal occlusion, aneurysm, or vascular malformation. Non stenotic calcific atherosclerosis of the carotid siphons. Posterior circulation: No significant stenosis, proximal occlusion, aneurysm, or vascular malformation. Non stenotic calcific atherosclerosis of the bilateral vertebral arteries. Venous sinuses: As permitted by contrast timing, patent. Anatomic variants: Fetal right PCA. Review of the MIP images confirms the above findings CT Brain Perfusion Findings: CBF  (<30%) Volume: 33mL Perfusion (Tmax>6.0s) volume: 48mL Mismatch Volume: 6mL Infarction Location:No acute stroke by perfusion criteria. Increased T-max corresponds to the left parieto-occipital junction, however, there is translational motion artifact on the CT perfusion post processing misregistration artifact. The motion is continuous throughout the scan. IMPRESSION: CTA neck: 1. Patent carotid and vertebral arteries. No dissection, aneurysm, or hemodynamically significant stenosis by NASCET criteria. 2. 4 cm ascending aortic aneurysm. Recommend annual imaging followup by CTA or MRA. This recommendation follows 2010 ACCF/AHA/AATS/ACR/ASA/SCA/SCAI/SIR/STS/SVM Guidelines for the Diagnosis and Management of Patients with Thoracic Aortic Disease. 2010; 121: D622-W979. 3. Calcific atherosclerosis of bilateral carotid bifurcations with mild less than 50% proximal ICA stenosis bilaterally. 4. Advanced cervical spondylosis with multifactorial moderate to severe C5-6 spinal canal  stenosis. CTA head: Patent anterior and posterior intracranial circulation. No large vessel occlusion, aneurysm, or significant stenosis. CT brain perfusion: Motion artifact resulting post processing misregistration. Small areas of perfusion anomaly may be obscured. No gross large vascular territory acute infarct by perfusion criteria. Electronically Signed   By: Kristine Garbe M.D.   On: 12/14/2017 20:38   Dg Chest 1 View  Result Date: 12/18/2017 CLINICAL DATA:  Altered mental status. EXAM: CHEST  1 VIEW COMPARISON:  12/14/2017 FINDINGS: There is mild left basilar airspace disease likely reflecting atelectasis. There is no other areas of focal consolidation. There is no pleural effusion or pneumothorax. The heart and mediastinal contours are unremarkable. The osseous structures are unremarkable. IMPRESSION: 1. No acute cardiopulmonary disease. Electronically Signed   By: Kathreen Devoid   On: 12/18/2017 12:27   Ct Head Wo  Contrast  Result Date: 12/18/2017 CLINICAL DATA:  Confusion beginning yesterday.  Agitation. EXAM: CT HEAD WITHOUT CONTRAST TECHNIQUE: Contiguous axial images were obtained from the base of the skull through the vertex without intravenous contrast. COMPARISON:  CT 12/14/2017.  MRI 12/15/2017. FINDINGS: Brain: No change. Brain atrophy. Chronic small-vessel ischemic changes of the hemispheric white matter. No sign of acute infarction, mass lesion, hemorrhage, hydrocephalus or extra-axial collection. Vascular: There is atherosclerotic calcification of the major vessels at the base of the brain. Skull: Negative Sinuses/Orbits: Clear/normal Other: None IMPRESSION: No change. No acute finding. Atrophy and chronic small-vessel ischemic change. Electronically Signed   By: Nelson Chimes M.D.   On: 12/18/2017 12:29   Ct Angio Neck W And/or Wo Contrast  Result Date: 12/14/2017 CLINICAL DATA:  77 y/o  M; right facial droop. EXAM: CT ANGIOGRAPHY HEAD AND NECK CT PERFUSION BRAIN TECHNIQUE: Multidetector CT imaging of the head and neck was performed using the standard protocol during bolus administration of intravenous contrast. Multiplanar CT image reconstructions and MIPs were obtained to evaluate the vascular anatomy. Carotid stenosis measurements (when applicable) are obtained utilizing NASCET criteria, using the distal internal carotid diameter as the denominator. Multiphase CT imaging of the brain was performed following IV bolus contrast injection. Subsequent parametric perfusion maps were calculated using RAPID software. CONTRAST:  132mL ISOVUE-370 IOPAMIDOL (ISOVUE-370) INJECTION 76% COMPARISON:  12/14/2017 CT head. FINDINGS: CTA NECK FINDINGS Aortic arch: Standard branching. Imaged portion shows no evidence of dissection. No significant stenosis of the major arch vessel origins. 4 cm ascending aorta. Calcific atherosclerosis. Right carotid system: No evidence of dissection, stenosis (50% or greater) or occlusion.  Calcific atherosclerosis of the carotid bifurcation with mild less than 50% proximal ICA stenosis. Left carotid system: No evidence of dissection, stenosis (50% or greater) or occlusion. Calcific atherosclerosis of the carotid bifurcation with mild less than 50% proximal ICA stenosis. Vertebral arteries: Left dominant. No evidence of dissection, stenosis (50% or greater) or occlusion. Skeleton: Moderate cervical spondylosis with multilevel disc and facet degenerative changes greatest at the C5-C7 levels. C4-5 grade 1 anterolisthesis. Uncovertebral and facet hypertrophy results in right-sided bony foraminal encroachment at C5-C7 and left-sided bony foraminal encroachment at C4-C7. Multifactorial moderate to severe spinal canal stenosis at C5-6. Other neck: Negative. Upper chest: Severe coronary artery calcific atherosclerosis, partially visualized. Mildly patulous thoracic esophagus. Review of the MIP images confirms the above findings CTA HEAD FINDINGS Anterior circulation: No significant stenosis, proximal occlusion, aneurysm, or vascular malformation. Non stenotic calcific atherosclerosis of the carotid siphons. Posterior circulation: No significant stenosis, proximal occlusion, aneurysm, or vascular malformation. Non stenotic calcific atherosclerosis of the bilateral vertebral arteries. Venous sinuses: As permitted  by contrast timing, patent. Anatomic variants: Fetal right PCA. Review of the MIP images confirms the above findings CT Brain Perfusion Findings: CBF (<30%) Volume: 79mL Perfusion (Tmax>6.0s) volume: 30mL Mismatch Volume: 78mL Infarction Location:No acute stroke by perfusion criteria. Increased T-max corresponds to the left parieto-occipital junction, however, there is translational motion artifact on the CT perfusion post processing misregistration artifact. The motion is continuous throughout the scan. IMPRESSION: CTA neck: 1. Patent carotid and vertebral arteries. No dissection, aneurysm, or  hemodynamically significant stenosis by NASCET criteria. 2. 4 cm ascending aortic aneurysm. Recommend annual imaging followup by CTA or MRA. This recommendation follows 2010 ACCF/AHA/AATS/ACR/ASA/SCA/SCAI/SIR/STS/SVM Guidelines for the Diagnosis and Management of Patients with Thoracic Aortic Disease. 2010; 121: M629-U765. 3. Calcific atherosclerosis of bilateral carotid bifurcations with mild less than 50% proximal ICA stenosis bilaterally. 4. Advanced cervical spondylosis with multifactorial moderate to severe C5-6 spinal canal stenosis. CTA head: Patent anterior and posterior intracranial circulation. No large vessel occlusion, aneurysm, or significant stenosis. CT brain perfusion: Motion artifact resulting post processing misregistration. Small areas of perfusion anomaly may be obscured. No gross large vascular territory acute infarct by perfusion criteria. Electronically Signed   By: Kristine Garbe M.D.   On: 12/14/2017 20:38   Ct Chest W Contrast  Result Date: 12/18/2017 CLINICAL DATA:  Altered mental status, dizziness, low-grade fever EXAM: CT CHEST WITH CONTRAST TECHNIQUE: Multidetector CT imaging of the chest was performed during intravenous contrast administration. CONTRAST:  2mL ISOVUE-300 IOPAMIDOL (ISOVUE-300) INJECTION 61% COMPARISON:  Chest radiograph dated 12/18/2017 FINDINGS: Motion degraded images (patient and respiratory motion). Cardiovascular: The heart is top-normal in size. No pericardial effusion. No evidence of thoracic aortic aneurysm. Atherosclerotic calcifications of the aortic arch. Three vessel coronary atherosclerosis. Mediastinum/Nodes: No suspicious mediastinal lymphadenopathy. Visualized thyroid is unremarkable. Lungs/Pleura: Lungs are motion degraded. Within that constraint, there are no suspicious pulmonary nodules. No focal consolidation. Minimal dependent atelectasis in the bilateral lower lobes. Trace paraseptal emphysematous changes in the bilateral upper lobes.  No pleural effusion or pneumothorax. Upper Abdomen: Visualized upper abdomen is notable for prior cholecystectomy. Musculoskeletal: Degenerative changes of the visualized thoracolumbar spine. IMPRESSION: Motion degraded images. No evidence of acute cardiopulmonary disease. Aortic Atherosclerosis (ICD10-I70.0) and Emphysema (ICD10-J43.9). Electronically Signed   By: Julian Hy M.D.   On: 12/18/2017 18:59   Mr Brain Wo Contrast  Result Date: 12/19/2017 CLINICAL DATA:  Altered mental status. History of hypertension, diabetes. EXAM: MRI HEAD WITHOUT CONTRAST TECHNIQUE: Sagittal T1, axial and coronal diffusion weighted imaging and axial T2 sequences obtained on a 1.5 tesla scanner. Patient could not tolerate further imaging per technologist note. COMPARISON:  CT HEAD December and MRI brain December 2019. FINDINGS: INTRACRANIAL CONTENTS: No reduced diffusion to suggest acute ischemia. No midline shift or mass effect. Patchy to confluent supratentorial and pontine white matter FLAIR T2 hyperintensities. Moderate parenchymal brain volume loss. Mild sulcal effacement at the convexities. No abnormal extra-axial fluid collections. VASCULAR: Normal major intracranial vascular flow voids present at skull base. SKULL AND UPPER CERVICAL SPINE: No abnormal sellar expansion. No suspicious calvarial bone marrow signal. Craniocervical junction maintained. SINUSES/ORBITS: RIGHT mastoid effusion. Mild paranasal sinus mucosal thickening. Status post RIGHT ocular lens implant.The included ocular globes and orbital contents are non-suspicious. OTHER: None. IMPRESSION: 1. Limited 3 sequence MRI of the head: No acute intracranial process. 2. Moderate parenchymal brain volume with superimposed image findings normal pressure hydrocephalus. 3. Moderate parenchymal brain volume loss. Electronically Signed   By: Elon Alas M.D.   On: 12/19/2017 13:35   Mr Brain  Wo Contrast  Result Date: 12/15/2017 CLINICAL DATA:  Right  facial droop presenting yesterday. EXAM: MRI HEAD WITHOUT CONTRAST TECHNIQUE: Multiplanar, multiecho pulse sequences of the brain and surrounding structures were obtained without intravenous contrast. COMPARISON:  CT studies 12/24/2017 FINDINGS: Brain: Diffusion imaging does not show any acute or subacute infarction, with specific attention to the left parietal region where there was a borderline perfusion abnormality. Chronic small-vessel changes of the hemispheric white matter are seen, probably unchanged from previous studies. Vascular: Major vessels at the base of the brain show flow. Skull and upper cervical spine: Negative Sinuses/Orbits: No sinus disease seen. Other: None IMPRESSION: Negative diffusion imaging. Specifically, no infarction in the left parietal region. Electronically Signed   By: Nelson Chimes M.D.   On: 12/15/2017 07:29   Ct Cerebral Perfusion W Contrast  Result Date: 12/14/2017 CLINICAL DATA:  77 y/o  M; right facial droop. EXAM: CT ANGIOGRAPHY HEAD AND NECK CT PERFUSION BRAIN TECHNIQUE: Multidetector CT imaging of the head and neck was performed using the standard protocol during bolus administration of intravenous contrast. Multiplanar CT image reconstructions and MIPs were obtained to evaluate the vascular anatomy. Carotid stenosis measurements (when applicable) are obtained utilizing NASCET criteria, using the distal internal carotid diameter as the denominator. Multiphase CT imaging of the brain was performed following IV bolus contrast injection. Subsequent parametric perfusion maps were calculated using RAPID software. CONTRAST:  124mL ISOVUE-370 IOPAMIDOL (ISOVUE-370) INJECTION 76% COMPARISON:  12/14/2017 CT head. FINDINGS: CTA NECK FINDINGS Aortic arch: Standard branching. Imaged portion shows no evidence of dissection. No significant stenosis of the major arch vessel origins. 4 cm ascending aorta. Calcific atherosclerosis. Right carotid system: No evidence of dissection, stenosis  (50% or greater) or occlusion. Calcific atherosclerosis of the carotid bifurcation with mild less than 50% proximal ICA stenosis. Left carotid system: No evidence of dissection, stenosis (50% or greater) or occlusion. Calcific atherosclerosis of the carotid bifurcation with mild less than 50% proximal ICA stenosis. Vertebral arteries: Left dominant. No evidence of dissection, stenosis (50% or greater) or occlusion. Skeleton: Moderate cervical spondylosis with multilevel disc and facet degenerative changes greatest at the C5-C7 levels. C4-5 grade 1 anterolisthesis. Uncovertebral and facet hypertrophy results in right-sided bony foraminal encroachment at C5-C7 and left-sided bony foraminal encroachment at C4-C7. Multifactorial moderate to severe spinal canal stenosis at C5-6. Other neck: Negative. Upper chest: Severe coronary artery calcific atherosclerosis, partially visualized. Mildly patulous thoracic esophagus. Review of the MIP images confirms the above findings CTA HEAD FINDINGS Anterior circulation: No significant stenosis, proximal occlusion, aneurysm, or vascular malformation. Non stenotic calcific atherosclerosis of the carotid siphons. Posterior circulation: No significant stenosis, proximal occlusion, aneurysm, or vascular malformation. Non stenotic calcific atherosclerosis of the bilateral vertebral arteries. Venous sinuses: As permitted by contrast timing, patent. Anatomic variants: Fetal right PCA. Review of the MIP images confirms the above findings CT Brain Perfusion Findings: CBF (<30%) Volume: 19mL Perfusion (Tmax>6.0s) volume: 46mL Mismatch Volume: 2mL Infarction Location:No acute stroke by perfusion criteria. Increased T-max corresponds to the left parieto-occipital junction, however, there is translational motion artifact on the CT perfusion post processing misregistration artifact. The motion is continuous throughout the scan. IMPRESSION: CTA neck: 1. Patent carotid and vertebral arteries. No  dissection, aneurysm, or hemodynamically significant stenosis by NASCET criteria. 2. 4 cm ascending aortic aneurysm. Recommend annual imaging followup by CTA or MRA. This recommendation follows 2010 ACCF/AHA/AATS/ACR/ASA/SCA/SCAI/SIR/STS/SVM Guidelines for the Diagnosis and Management of Patients with Thoracic Aortic Disease. 2010; 121: B147-W295. 3. Calcific atherosclerosis of bilateral carotid bifurcations with  mild less than 50% proximal ICA stenosis bilaterally. 4. Advanced cervical spondylosis with multifactorial moderate to severe C5-6 spinal canal stenosis. CTA head: Patent anterior and posterior intracranial circulation. No large vessel occlusion, aneurysm, or significant stenosis. CT brain perfusion: Motion artifact resulting post processing misregistration. Small areas of perfusion anomaly may be obscured. No gross large vascular territory acute infarct by perfusion criteria. Electronically Signed   By: Kristine Garbe M.D.   On: 12/14/2017 20:38   Dg Chest Port 1 View  Result Date: 12/15/2017 CLINICAL DATA:  Altered mental status. EXAM: PORTABLE CHEST 1 VIEW COMPARISON:  Chest radiograph May 31, 2012 FINDINGS: Cardiac silhouette is mildly enlarged, mediastinal is unremarkable for this low inspiratory examination with crowded vasculature markings. No focal consolidation. Small pleural effusions. Trachea projects midline and there is no pneumothorax. Included soft tissue planes and osseous structures are non-suspicious. Widened RIGHT AC joint may be postoperative. IMPRESSION: Low inspiratory examination. Mild cardiomegaly. Small pleural effusions. Electronically Signed   By: Elon Alas M.D.   On: 12/15/2017 00:18   Ct Head Code Stroke Wo Contrast`  Result Date: 12/14/2017 CLINICAL DATA:  Code stroke. 77 y/o M; right-sided facial droop and slurred speech since 6 p.m. EXAM: CT HEAD WITHOUT CONTRAST TECHNIQUE: Contiguous axial images were obtained from the base of the skull through the  vertex without intravenous contrast. COMPARISON:  12/06/2014 MRI head and CT head. FINDINGS: Brain: No evidence of acute infarction, hemorrhage, hydrocephalus, extra-axial collection or mass lesion/mass effect. Chronic microvascular ischemic changes and volume loss of the brain. Vascular: Calcific atherosclerosis of carotid siphons. No hyperdense vessel identified. Skull: Normal. Negative for fracture or focal lesion. Sinuses/Orbits: Right mastoid opacification. Additional visible paranasal sinuses and mastoid air cells are otherwise normally aerated. Right intra-ocular lens replacement Other: None. ASPECTS [facility] Stroke Program Early CT Score) - Ganglionic level infarction (caudate, lentiform nuclei, internal capsule, insula, M1-M3 cortex): 7 - Supraganglionic infarction (M4-M6 cortex): 3 Total score (0-10 with 10 being normal): 10 IMPRESSION: 1. No acute intracranial abnormality identified. 2. ASPECTS is 10. 3. Stable chronic microvascular ischemic changes and volume loss of the brain. 4. Right mastoid opacification. These results were called by telephone at the time of interpretation on 12/14/2017 at 6:58 pm to Dr. Virgel Manifold , who verbally acknowledged these results. Electronically Signed   By: Kristine Garbe M.D.   On: 12/14/2017 19:02   Dg Fluoro Guide Lumbar Puncture  Result Date: 12/20/2017 CLINICAL DATA:  Acute encephalopathy EXAM: DIAGNOSTIC LUMBAR PUNCTURE UNDER FLUOROSCOPIC GUIDANCE FLUOROSCOPY TIME:  Fluoroscopy Time: 0 minutes 18 seconds Radiation Exposure Index (if provided by the fluoroscopic device): 11.5 mGy Number of Acquired Spot Images: 1 PROCEDURE: Procedure, benefits, and risks were discussed with the patient, including alternatives. Patient's questions were answered. Written informed consent was obtained. Timeout protocol followed. Patient placed in LEFT lateral decubitus position, unable to lie prone. L4-L5 disc space was localized under fluoroscopy. Skin prepped and  draped in usual sterile fashion. Skin and soft tissues anesthetized with 3 mL of 1% lidocaine. 22 gauge needle was advanced into the spinal canal where clear colorless CSF was encountered with an opening pressure of 30 cm H2O (measured LLD). 14 mL of CSF was obtained in 4 tubes for requested analysis. Procedure tolerated very well by patient without immediate complication. IMPRESSION: Fluoroscopic guided lumbar puncture as above. Elevated opening pressure of 30 cm H2O. Electronically Signed   By: Lavonia Dana M.D.   On: 12/20/2017 15:30    Microbiology: Recent Results (from the past 240  hour(s))  Urine culture     Status: None   Collection Time: 12/18/17 11:05 AM  Result Value Ref Range Status   Specimen Description   Final    URINE, CLEAN CATCH Performed at [facility], [address] [place], [place] [address]    Special Requests   Final    NONE Performed at [facility], [address] [place], [place] [address]    Culture   Final    NO GROWTH Performed at [facility], [place] [address] [place], [place] [address]    Report Status 12/20/2017 FINAL  Final  Culture, blood (routine x 2)     Status: None   Collection Time: 12/18/17 12:46 PM  Result Value Ref Range Status   Specimen Description BLOOD LEFT ARM  Final   Special Requests   Final    BOTTLES DRAWN AEROBIC AND ANAEROBIC Blood Culture adequate volume   Culture   Final    NO GROWTH 5 DAYS Performed at [facility], [address], [place], [place] [address]    Report Status 12/23/2017 FINAL  Final  Culture, blood (routine x 2)     Status: None   Collection Time: 12/18/17 12:46 PM  Result Value Ref Range Status   Specimen Description BLOOD RIGHT HAND  Final   Special Requests   Final    BOTTLES DRAWN AEROBIC AND ANAEROBIC Blood Culture adequate volume   Culture   Final    NO GROWTH 5 DAYS Performed at [facility], [address], [place], [place] [address]    Report Status 12/23/2017 FINAL  Final  CSF culture      Status: None (Preliminary result)   Collection Time: 12/20/17  2:35 PM  Result Value Ref Range Status   Specimen Description   Final    CSF COLLECTED BY DOCTOR Performed at [facility], [address] [place], [place] [address]    Special Requests   Final    NONE Performed at [facility], [address], [place], [place] [address]    Gram Stain   Final    CYTOSPIN SMEAR WBC PRESENT, PREDOMINANTLY MONONUCLEAR NO ORGANISMS SEEN Performed at [facility] Performed at [facility], [address] [place], [place] [address]    Culture   Final    NO GROWTH 3 DAYS Performed at [facility], [place] [address], [place], [place] [address]    Report Status PENDING  Incomplete     Labs: Basic Metabolic Panel: Recent Labs  Lab 12/18/17 1104 12/18/17 2145 12/19/17 0606 12/23/17 0434  NA 135  --  135 136  K 3.6  --  3.5 3.4*  CL 101  --  100 104  CO2 25  --  24 21*  GLUCOSE 112*  --  194* 178*  BUN 15  --  13 18  CREATININE 0.94  --  0.85 0.81  CALCIUM 8.8*  --  8.5* 8.7*  MG  --  1.9  --   --    Liver Function Tests: Recent Labs  Lab 12/18/17 1104  AST 23  ALT 23  ALKPHOS 64  BILITOT 1.0  PROT 7.7  ALBUMIN 4.0   Recent Labs  Lab 12/18/17 1246  AMMONIA 14   CBC: Recent Labs  Lab 12/18/17 1104 12/19/17 0606 12/23/17 0434  WBC 8.0 8.2 8.2  NEUTROABS 5.2  --   --   HGB 15.5 15.4 16.1  HCT 49.0 47.8 48.9  MCV 89.9 91.0 91.4  PLT 287 314 300   Cardiac Enzymes: Recent Labs  Lab 12/18/17 1104  TROPONINI <0.03   CBG: Recent Labs  Lab 12/22/17 1113 12/22/17 1610 12/22/17 2117 12/23/17 0742 12/23/17 1116  GLUCAP 301* 125* 192* 177* 261*    Signed:  Barton Dubois MD.  [facility] 12/23/2017, 4:42 PM

## 2017-12-23 NOTE — Clinical Social Work Note (Signed)
Clinical Social Work Assessment  Patient Details  Name: [name] MRN: [id number] Date of Birth: 16-May-1940  Date of referral:  12/23/17               Reason for consult:  Discharge Planning                Permission sought to share information with:    Permission granted to share information::     Name::        Agency::     Relationship::     Contact Information:  wife at bedside   Housing/Transportation Living arrangements for the past 2 months:  Single Family Home Source of Information:  Patient, Spouse Patient Interpreter Needed:  None Criminal Activity/Legal Involvement Pertinent to Current Situation/Hospitalization:  No - Comment as needed Significant Relationships:  Spouse, Adult Children Lives with:  Spouse Do you feel safe going back to the place where you live?  Yes Need for family participation in patient care:  Yes (Comment)  Care giving concerns:  At baseline patient is independent. Wife is concerned that he will be difficult to manage in regards to ambulation.   Social Worker assessment / plan:  Patient declined SNF and prefers to go home at discharge.  He is agreeable to HHPT.  Case management is aware and will arrange.  LCSW signing off.   Employment status:  Retired Nurse, adult PT Recommendations:  [place] / Referral to community resources:  [place]  Patient/Family's Response to care:  Patient is not agreeable to SNF. He accepts HHPT.   Patient/Family's Understanding of and Emotional Response to Diagnosis, Current Treatment, and Prognosis:  Patient understands his diagnosis, treatment and prognosis.   Emotional Assessment Appearance:  Appears stated age Attitude/Demeanor/Rapport:    Affect (typically observed):  Accepting, Calm Orientation:  Oriented to Self, Oriented to Place, Oriented to  Time, Oriented to Situation Alcohol / Substance use:  Not Applicable Psych  involvement (Current and /or in the community):  No (Comment)  Discharge Needs  Concerns to be addressed:  Discharge Planning Concerns Readmission within the last 30 days:  No Current discharge risk:  None Barriers to Discharge:  No Barriers Identified   [name], LCSW 12/23/2017, 4:22 PM

## 2017-12-24 ENCOUNTER — Encounter: Payer: Self-pay | Admitting: Adult Health

## 2017-12-24 LAB — LYME DISEASE DNA BY PCR(BORRELIA BURG): Lyme Disease(B.burgdorferi)PCR: NEGATIVE

## 2017-12-24 LAB — CSF CULTURE W GRAM STAIN: Culture: NO GROWTH

## 2017-12-26 DIAGNOSIS — Z7982 Long term (current) use of aspirin: Secondary | ICD-10-CM | POA: Diagnosis not present

## 2017-12-26 DIAGNOSIS — Z794 Long term (current) use of insulin: Secondary | ICD-10-CM | POA: Diagnosis not present

## 2017-12-26 DIAGNOSIS — E785 Hyperlipidemia, unspecified: Secondary | ICD-10-CM | POA: Diagnosis not present

## 2017-12-26 DIAGNOSIS — G4733 Obstructive sleep apnea (adult) (pediatric): Secondary | ICD-10-CM | POA: Diagnosis not present

## 2017-12-26 DIAGNOSIS — I10 Essential (primary) hypertension: Secondary | ICD-10-CM | POA: Diagnosis not present

## 2017-12-26 DIAGNOSIS — I2510 Atherosclerotic heart disease of native coronary artery without angina pectoris: Secondary | ICD-10-CM | POA: Diagnosis not present

## 2017-12-26 DIAGNOSIS — E1165 Type 2 diabetes mellitus with hyperglycemia: Secondary | ICD-10-CM | POA: Diagnosis not present

## 2017-12-27 ENCOUNTER — Other Ambulatory Visit: Payer: Self-pay

## 2017-12-27 DIAGNOSIS — E1165 Type 2 diabetes mellitus with hyperglycemia: Secondary | ICD-10-CM | POA: Diagnosis not present

## 2017-12-27 DIAGNOSIS — I10 Essential (primary) hypertension: Secondary | ICD-10-CM | POA: Diagnosis not present

## 2017-12-27 DIAGNOSIS — Z7982 Long term (current) use of aspirin: Secondary | ICD-10-CM | POA: Diagnosis not present

## 2017-12-27 DIAGNOSIS — Z794 Long term (current) use of insulin: Secondary | ICD-10-CM | POA: Diagnosis not present

## 2017-12-27 DIAGNOSIS — I2510 Atherosclerotic heart disease of native coronary artery without angina pectoris: Secondary | ICD-10-CM | POA: Diagnosis not present

## 2017-12-27 DIAGNOSIS — G4733 Obstructive sleep apnea (adult) (pediatric): Secondary | ICD-10-CM | POA: Diagnosis not present

## 2017-12-27 DIAGNOSIS — E785 Hyperlipidemia, unspecified: Secondary | ICD-10-CM | POA: Diagnosis not present

## 2017-12-27 NOTE — Patient Outreach (Signed)
[facility]) Care Management  12/27/2017  [name] March 23, 1940 [id number]    EMMI-General discharge  Return phone call from wife [name], listed as approved person to speak with, HIPAA for patient verified. Reviewed and addressed red alert. Wife states the patient will f/u with his PCP Dr. Warrick Parisian on 12/30/17. He has a f/u with [facility] in February, wife plans to call to have pt added to the cancellation list. Wife states the patient's confusion has improved, however, he has not returned to baseline. She is unable to leave him alone, and is caring for him solely. Pt's son is estranged and his nephew lives in [place]. Wife reports having a SN in place through [place], PT is scheduled for an eval today. She states the patients fevers have subsided. He is taking all of his medications exactly as prescribed except for Seroquel, she is only giving at bedtime and not bid due to increased sedation with twice daily doses. Patient is incontinent of urine, he is wearing depends. Wife is aware to keep skin clean and dry. Wife states the patient is c/o neck and shoulder pain, [place] is aware, she will also notify the PT today. Wife will provide transportation for patient.   Mrs. Hynes chief complaints are; 1) frustrated that the cause of his sudden confusion has not been identified 2) concerned about the patient having worsening shoulder and neck pain 3) she needs respite or custodial help caring for the patient so she can leave the home to grocery shop, run errands, etc. She declines wanting a HHA in the home while [facility] services are in place. She states the patient refuses to go to an IP rehab facility.   RN CM encouraged wife to continue to monitor for fevers, worsening confusion and or change in BP. Wife will monitor BP's at home. RN CM instructed wife on who to call if needed for new or worsening symptoms. Discussed potential for fall risk if patient  stands or changes positions too quickly. Discussed patient's adherence with his CPAP, he is not adhering, she will discuss with PCP on Friday. Wife will ask PCP about Lyme's test results completed while IP. She plans to call [facility] today to request a SW visit to explore respite care options.   Advised wife the patient will continue to get automated EMMI-GENERAL post discharge calls and may receive a call from a nurse if any of their responses are abnormal. Wife voiced understanding and is appreciative of f/u call.    Plan: RN CM will f/u with patient/wife in about a week to assess for CM needs.   Barb Merino, RN,CCM [facility] Care Management Care Management Coordinator Direct Phone: [phone] Toll Free: [phone] Fax: [phone]

## 2017-12-27 NOTE — Patient Outreach (Signed)
[facility]) Care Management  12/27/2017  [name] 1940/09/06 [id number]    EMMI-General discharge  RED ON EMMI ALERT Day # 1 Date: 12/26/17 1:59 PM Red Alert Reason: "Scheduled follow-up? No"   Outreach attempt # 1 unsuccessful. RN CM left a voice message including my name and phone number, requesting a return phone call.    Plan: RN CM will make outreach attempt to patient within 3-4 business days, will send unsuccessful outreach letter to patient.    Barb Merino, RN,CCM [facility] Care Management Care Management Coordinator Direct Phone: [phone] Toll Free: [phone] Fax: [phone]

## 2017-12-29 DIAGNOSIS — I2510 Atherosclerotic heart disease of native coronary artery without angina pectoris: Secondary | ICD-10-CM | POA: Diagnosis not present

## 2017-12-29 DIAGNOSIS — E1165 Type 2 diabetes mellitus with hyperglycemia: Secondary | ICD-10-CM | POA: Diagnosis not present

## 2017-12-29 DIAGNOSIS — G4733 Obstructive sleep apnea (adult) (pediatric): Secondary | ICD-10-CM | POA: Diagnosis not present

## 2017-12-29 DIAGNOSIS — I10 Essential (primary) hypertension: Secondary | ICD-10-CM | POA: Diagnosis not present

## 2017-12-29 DIAGNOSIS — Z794 Long term (current) use of insulin: Secondary | ICD-10-CM | POA: Diagnosis not present

## 2017-12-29 DIAGNOSIS — E785 Hyperlipidemia, unspecified: Secondary | ICD-10-CM | POA: Diagnosis not present

## 2017-12-29 DIAGNOSIS — Z7982 Long term (current) use of aspirin: Secondary | ICD-10-CM | POA: Diagnosis not present

## 2017-12-30 ENCOUNTER — Telehealth: Payer: Self-pay | Admitting: Family Medicine

## 2017-12-30 ENCOUNTER — Ambulatory Visit: Payer: Self-pay

## 2017-12-30 ENCOUNTER — Ambulatory Visit (INDEPENDENT_AMBULATORY_CARE_PROVIDER_SITE_OTHER): Payer: Medicare Other | Admitting: Family Medicine

## 2017-12-30 ENCOUNTER — Encounter: Payer: Self-pay | Admitting: Family Medicine

## 2017-12-30 VITALS — BP 118/69 | HR 82 | Temp 97.2°F | Ht 66.0 in | Wt 208.0 lb

## 2017-12-30 DIAGNOSIS — G053 Encephalitis and encephalomyelitis in diseases classified elsewhere: Secondary | ICD-10-CM | POA: Diagnosis not present

## 2017-12-30 DIAGNOSIS — A86 Unspecified viral encephalitis: Secondary | ICD-10-CM

## 2017-12-30 DIAGNOSIS — Z09 Encounter for follow-up examination after completed treatment for conditions other than malignant neoplasm: Secondary | ICD-10-CM

## 2017-12-30 MED ORDER — PREDNISONE 20 MG PO TABS: ORAL_TABLET | ORAL | 0 refills | Status: DC

## 2017-12-30 NOTE — Telephone Encounter (Signed)
I sent in prednisone for the patient

## 2017-12-30 NOTE — Progress Notes (Signed)
BP 118/69 (BP Location: Left Arm)   Pulse 82   Temp (!) 97.2 F (36.2 C) (Oral)   Ht '5\' 6"'  (1.676 m)   Wt 208 lb (94.3 kg)   BMI 33.57 kg/m    Subjective:    Patient ID: [name], male    DOB: 1940/12/18, 77 y.o.   MRN: [id number]  HPI: [name] is a 77 y.o. male presenting on 12/30/2017 for Hospitalization Follow-up (AP 12/8-12/13 - encephalopathy and altered mental status)   HPI Altered mental status and infection Patient was admitted to [facility] on 12/18/2017 until 12/23/2017 for altered mental status and with occult encephalopathy versus meningitis.  He was treated with antibiotics and acyclovir during the hospitalization and they never did find an exact cause for what went on except for that it was probably a viral meningitis versus encephalitis.  Patient was started on Seroquel for possible psychiatric cause and is still taking the Seroquel and is sleeping better with it at night.  Patient does have a neurology appointment in February to follow-up with this.  Patient's wife says he has not been eating as well and has been losing weight still but he is starting to get some strength back and move around more.  The physical therapy is coming.  He also had neck stiffness and tightness which has improved.  Patient was contacted by our office on 12/16/2016  Relevant past medical, surgical, family and social history reviewed and updated as indicated. Interim medical history since our last visit reviewed. Allergies and medications reviewed and updated.  Review of Systems  Constitutional: Positive for activity change and fatigue. Negative for chills and fever.  Eyes: Negative for visual disturbance.  Respiratory: Negative for shortness of breath and wheezing.   Cardiovascular: Negative for chest pain and leg swelling.  Musculoskeletal: Positive for neck pain and neck stiffness. Negative for back pain and gait problem.  Skin: Negative for rash.  Neurological:  Positive for headaches. Negative for dizziness, weakness and numbness.  Psychiatric/Behavioral: Positive for confusion, decreased concentration and sleep disturbance. Negative for behavioral problems, self-injury and suicidal ideas. The patient is not nervous/anxious.   All other systems reviewed and are negative.   Per HPI unless specifically indicated above   Allergies as of 12/30/2017      Reactions   Ace Inhibitors Hives, Swelling, Rash   Rash,hives,tongue swelling   Angiotensin Receptor Blockers    Unknown reaction    Ciprofloxacin Other (See Comments)   confusion   Other Hives, Other (See Comments)   Altaseptic, altase (cough)   Oxycodone-acetaminophen Other (See Comments)   REACTION: unknown reaction   Ramipril Cough   Robaxin [methocarbamol] Other (See Comments)   Confusion    Toradol [ketorolac Tromethamine] Other (See Comments)   confusion   Valium Other (See Comments)   Hallucinations; confusion   Doxycycline Hives, Rash      Medication List       Accurate as of December 30, 2017  2:59 PM. Always use your most recent med list.        acetaminophen 650 MG CR tablet Commonly known as:  TYLENOL Take 1,300 mg by mouth 2 (two) times daily as needed for pain.   amLODipine 10 MG tablet Commonly known as:  NORVASC Take 1 tablet (10 mg total) by mouth daily.   aspirin EC 81 MG tablet Take 81 mg by mouth every evening.   atorvastatin 20 MG tablet Commonly known as:  LIPITOR TAKE 1 TABLET  BY MOUTH  DAILY AT 6 PM.   BLUE-EMU MAXIMUM STRENGTH EX Apply 1 application topically daily as needed (muscle pain).   butalbital-acetaminophen-caffeine 50-325-40 MG tablet Commonly known as:  FIORICET, ESGIC Take 1 tablet by mouth 2 (two) times daily as needed for headache.   cetirizine 10 MG tablet Commonly known as:  ZYRTEC Take 10 mg by mouth every evening.   docusate sodium 100 MG capsule Commonly known as:  COLACE Take 1 capsule (100 mg total) by mouth 2 (two)  times daily.   famotidine 20 MG tablet Commonly known as:  PEPCID TAKE 1 TABLET BY MOUTH TWO  TIMES DAILY   HUMALOG KWIKPEN 100 UNIT/ML KwikPen Generic drug:  insulin lispro INJECT 0 TO 15 UNITS  SUBCUTANEOUSLY 3 TIMES  DAILY BEFORE MEALS  (70-150=8 UNITS, 151-200=9  UNITS)   LANTUS SOLOSTAR 100 UNIT/ML Solostar Pen Generic drug:  Insulin Glargine INJECT SUBCUTANEOUSLY 52  UNITS AT BEDTIME   losartan 50 MG tablet Commonly known as:  COZAAR TAKE 1 TABLET BY MOUTH  DAILY   metFORMIN 500 MG 24 hr tablet Commonly known as:  GLUCOPHAGE-XR TAKE 2 TABLETS BY MOUTH TWO TIMES DAILY   nitroGLYCERIN 0.4 MG SL tablet Commonly known as:  NITROSTAT DISSOLVE ONE TABLET UNDER THE TONGUE EVERY 5 MINUTES AS NEEDED FOR CHEST PAIN.  DO NOT EXCEED A TOTAL OF 3 DOSES IN 15 MINUTES   ONE TOUCH ULTRA TEST test strip Generic drug:  glucose blood USE 1 STRIP TO CHECK GLUCOSE 4 TIMES DAILY   PARoxetine 20 MG tablet Commonly known as:  PAXIL Take 0.5 tablets (10 mg total) by mouth daily.   polyethylene glycol packet Commonly known as:  MIRALAX / GLYCOLAX Take 17 g by mouth daily.   polyvinyl alcohol 1.4 % ophthalmic solution Commonly known as:  LIQUIFILM TEARS Place 1 drop into both eyes as needed for dry eyes.   QUEtiapine 25 MG tablet Commonly known as:  SEROQUEL Take 1 tablet (25 mg total) by mouth 2 (two) times daily.   tamsulosin 0.4 MG Caps capsule Commonly known as:  FLOMAX TAKE 1 CAPSULE BY MOUTH  DAILY   vitamin B-12 1000 MCG tablet Commonly known as:  CYANOCOBALAMIN Take 2 tablets (2,000 mcg total) by mouth daily.   Vitamin D3 50 MCG (2000 UT) Tabs Take 2,000 Units by mouth every morning.          Objective:    BP 118/69 (BP Location: Left Arm)   Pulse 82   Temp (!) 97.2 F (36.2 C) (Oral)   Ht '5\' 6"'  (1.676 m)   Wt 208 lb (94.3 kg)   BMI 33.57 kg/m   Wt Readings from Last 3 Encounters:  12/30/17 208 lb (94.3 kg)  12/18/17 217 lb 9.5 oz (98.7 kg)  12/15/17 217  lb 9.5 oz (98.7 kg)    Physical Exam Vitals signs and nursing note reviewed.  Constitutional:      General: He is not in acute distress.    Appearance: He is well-developed. He is not diaphoretic.  Eyes:     General: No scleral icterus.    Conjunctiva/sclera: Conjunctivae normal.  Neck:     Musculoskeletal: Neck supple. No neck rigidity.     Thyroid: No thyromegaly.  Cardiovascular:     Rate and Rhythm: Normal rate and regular rhythm.     Heart sounds: Normal heart sounds. No murmur.  Pulmonary:     Effort: Pulmonary effort is normal. No respiratory distress.     Breath sounds:  Normal breath sounds. No wheezing or rales.  Musculoskeletal: Normal range of motion.  Lymphadenopathy:     Cervical: No cervical adenopathy.  Skin:    General: Skin is warm and dry.     Findings: No rash.  Neurological:     General: No focal deficit present.     Mental Status: He is alert and oriented to person, place, and time.     Cranial Nerves: No cranial nerve deficit.     Sensory: No sensory deficit.     Motor: Weakness (Generalized weakness) present.     Coordination: Coordination normal.  Psychiatric:        Behavior: Behavior normal.         Assessment & Plan:   Problem List Items Addressed This Visit    None    Visit Diagnoses    Viral meningoencephalitis    -  Primary   Relevant Orders   CBC with [facility] discharge follow-up       Relevant Orders   CBC with Differential/Platelet   CMP14+EGFR      Patient is still having some confusion but it is gradually improving day today, he is here with his wife today and she says he is doing much better.  He has home physical therapy coming to work with him. Follow up plan: Return if symptoms worsen or fail to improve.  Counseling provided for all of the vaccine components Orders Placed This Encounter  Procedures  . CBC with Differential/Platelet  . [place] Lilliona Blakeney,  MD [facility] 12/30/2017, 2:59 PM

## 2017-12-31 LAB — CBC WITH DIFFERENTIAL/PLATELET
BASOS: 1 %
Basophils Absolute: 0.1 10*3/uL (ref 0.0–0.2)
EOS (ABSOLUTE): 0.2 10*3/uL (ref 0.0–0.4)
Eos: 3 %
Hematocrit: 42.9 % (ref 37.5–51.0)
Hemoglobin: 14.6 g/dL (ref 13.0–17.7)
Immature Grans (Abs): 0 10*3/uL (ref 0.0–0.1)
Immature Granulocytes: 0 %
Lymphocytes Absolute: 2 10*3/uL (ref 0.7–3.1)
Lymphs: 21 %
MCH: 29.7 pg (ref 26.6–33.0)
MCHC: 34 g/dL (ref 31.5–35.7)
MCV: 87 fL (ref 79–97)
Monocytes Absolute: 0.7 10*3/uL (ref 0.1–0.9)
Monocytes: 8 %
Neutrophils Absolute: 6.3 10*3/uL (ref 1.4–7.0)
Neutrophils: 67 %
Platelets: 401 10*3/uL (ref 150–450)
RBC: 4.92 x10E6/uL (ref 4.14–5.80)
RDW: 13.4 % (ref 12.3–15.4)
WBC: 9.4 10*3/uL (ref 3.4–10.8)

## 2017-12-31 LAB — CMP14+EGFR
ALT: 17 IU/L (ref 0–44)
AST: 16 IU/L (ref 0–40)
Albumin/Globulin Ratio: 1.1 — ABNORMAL LOW (ref 1.2–2.2)
Albumin: 3.7 g/dL (ref 3.5–4.8)
Alkaline Phosphatase: 82 IU/L (ref 39–117)
BUN/Creatinine Ratio: 19 (ref 10–24)
BUN: 18 mg/dL (ref 8–27)
Bilirubin Total: 0.4 mg/dL (ref 0.0–1.2)
CALCIUM: 9.7 mg/dL (ref 8.6–10.2)
CO2: 25 mmol/L (ref 20–29)
Chloride: 98 mmol/L (ref 96–106)
Creatinine, Ser: 0.95 mg/dL (ref 0.76–1.27)
GFR calc Af Amer: 89 mL/min/{1.73_m2} (ref >59)
GFR, EST NON AFRICAN AMERICAN: 77 mL/min/{1.73_m2} (ref >59)
Globulin, Total: 3.3 g/dL (ref 1.5–4.5)
Glucose: 137 mg/dL — ABNORMAL HIGH (ref 65–99)
Potassium: 4.4 mmol/L (ref 3.5–5.2)
SODIUM: 140 mmol/L (ref 134–144)
TOTAL PROTEIN: 7 g/dL (ref 6.0–8.5)

## 2017-12-31 NOTE — Telephone Encounter (Signed)
Patient's wife aware via voicemail

## 2018-01-02 ENCOUNTER — Other Ambulatory Visit: Payer: Self-pay

## 2018-01-02 DIAGNOSIS — I10 Essential (primary) hypertension: Secondary | ICD-10-CM | POA: Diagnosis not present

## 2018-01-02 DIAGNOSIS — E785 Hyperlipidemia, unspecified: Secondary | ICD-10-CM | POA: Diagnosis not present

## 2018-01-02 DIAGNOSIS — Z7982 Long term (current) use of aspirin: Secondary | ICD-10-CM | POA: Diagnosis not present

## 2018-01-02 DIAGNOSIS — Z794 Long term (current) use of insulin: Secondary | ICD-10-CM | POA: Diagnosis not present

## 2018-01-02 DIAGNOSIS — E1165 Type 2 diabetes mellitus with hyperglycemia: Secondary | ICD-10-CM | POA: Diagnosis not present

## 2018-01-02 DIAGNOSIS — I2510 Atherosclerotic heart disease of native coronary artery without angina pectoris: Secondary | ICD-10-CM | POA: Diagnosis not present

## 2018-01-02 DIAGNOSIS — G4733 Obstructive sleep apnea (adult) (pediatric): Secondary | ICD-10-CM | POA: Diagnosis not present

## 2018-01-02 NOTE — Patient Outreach (Signed)
[facility]) Care Management  01/02/2018  [name] 10/04/1940 [id number]    EMMI-General discharge FOLLOW-UP ATTEMPT #1  RED ON EMMI ALERT Day # 1 Date: 12/26/17 1:59 PM Red Alert Reason: "Scheduled follow-up? No"   Outreach FOLLOW-UP attempt # 1 unsuccessful.    Spoke with wife [name], she is driving and would like a call back. States she would like a call back after the holidays.   Other: Per review of PCP encounter, patient f/u with his PCP on 12/30/17. Per review of office note, patient's cognition is improving and the patient is receiving in home PT/OT for strengthening.    Plan: RN CM will outreach to wife/patient in about 2 weeks for f/u on cognition and progression toward wellness.      Barb Merino, RN,CCM [facility] Care Management Care Management Coordinator Direct Phone: [phone] Toll Free: [phone] Fax: [phone]

## 2018-01-03 DIAGNOSIS — E1165 Type 2 diabetes mellitus with hyperglycemia: Secondary | ICD-10-CM | POA: Diagnosis not present

## 2018-01-03 DIAGNOSIS — G4733 Obstructive sleep apnea (adult) (pediatric): Secondary | ICD-10-CM | POA: Diagnosis not present

## 2018-01-03 DIAGNOSIS — Z7982 Long term (current) use of aspirin: Secondary | ICD-10-CM | POA: Diagnosis not present

## 2018-01-03 DIAGNOSIS — E785 Hyperlipidemia, unspecified: Secondary | ICD-10-CM | POA: Diagnosis not present

## 2018-01-03 DIAGNOSIS — I10 Essential (primary) hypertension: Secondary | ICD-10-CM | POA: Diagnosis not present

## 2018-01-03 DIAGNOSIS — I2510 Atherosclerotic heart disease of native coronary artery without angina pectoris: Secondary | ICD-10-CM | POA: Diagnosis not present

## 2018-01-03 DIAGNOSIS — Z794 Long term (current) use of insulin: Secondary | ICD-10-CM | POA: Diagnosis not present

## 2018-01-05 DIAGNOSIS — Z7982 Long term (current) use of aspirin: Secondary | ICD-10-CM | POA: Diagnosis not present

## 2018-01-05 DIAGNOSIS — Z794 Long term (current) use of insulin: Secondary | ICD-10-CM | POA: Diagnosis not present

## 2018-01-05 DIAGNOSIS — I10 Essential (primary) hypertension: Secondary | ICD-10-CM | POA: Diagnosis not present

## 2018-01-05 DIAGNOSIS — I2510 Atherosclerotic heart disease of native coronary artery without angina pectoris: Secondary | ICD-10-CM | POA: Diagnosis not present

## 2018-01-05 DIAGNOSIS — E1165 Type 2 diabetes mellitus with hyperglycemia: Secondary | ICD-10-CM | POA: Diagnosis not present

## 2018-01-05 DIAGNOSIS — E785 Hyperlipidemia, unspecified: Secondary | ICD-10-CM | POA: Diagnosis not present

## 2018-01-05 DIAGNOSIS — G4733 Obstructive sleep apnea (adult) (pediatric): Secondary | ICD-10-CM | POA: Diagnosis not present

## 2018-01-09 ENCOUNTER — Ambulatory Visit (INDEPENDENT_AMBULATORY_CARE_PROVIDER_SITE_OTHER): Payer: Medicare Other

## 2018-01-09 DIAGNOSIS — G4733 Obstructive sleep apnea (adult) (pediatric): Secondary | ICD-10-CM | POA: Diagnosis not present

## 2018-01-09 DIAGNOSIS — E1165 Type 2 diabetes mellitus with hyperglycemia: Secondary | ICD-10-CM

## 2018-01-09 DIAGNOSIS — Z6835 Body mass index (BMI) 35.0-35.9, adult: Secondary | ICD-10-CM

## 2018-01-09 DIAGNOSIS — G44009 Cluster headache syndrome, unspecified, not intractable: Secondary | ICD-10-CM | POA: Diagnosis not present

## 2018-01-09 DIAGNOSIS — Z7982 Long term (current) use of aspirin: Secondary | ICD-10-CM

## 2018-01-09 DIAGNOSIS — E669 Obesity, unspecified: Secondary | ICD-10-CM

## 2018-01-09 DIAGNOSIS — Z794 Long term (current) use of insulin: Secondary | ICD-10-CM

## 2018-01-09 DIAGNOSIS — E785 Hyperlipidemia, unspecified: Secondary | ICD-10-CM

## 2018-01-09 DIAGNOSIS — N400 Enlarged prostate without lower urinary tract symptoms: Secondary | ICD-10-CM

## 2018-01-09 DIAGNOSIS — I10 Essential (primary) hypertension: Secondary | ICD-10-CM

## 2018-01-09 DIAGNOSIS — I2510 Atherosclerotic heart disease of native coronary artery without angina pectoris: Secondary | ICD-10-CM

## 2018-01-12 ENCOUNTER — Encounter: Payer: Self-pay | Admitting: Family Medicine

## 2018-01-13 DIAGNOSIS — G4733 Obstructive sleep apnea (adult) (pediatric): Secondary | ICD-10-CM | POA: Diagnosis not present

## 2018-01-13 DIAGNOSIS — E1165 Type 2 diabetes mellitus with hyperglycemia: Secondary | ICD-10-CM | POA: Diagnosis not present

## 2018-01-13 DIAGNOSIS — E785 Hyperlipidemia, unspecified: Secondary | ICD-10-CM | POA: Diagnosis not present

## 2018-01-13 DIAGNOSIS — Z7982 Long term (current) use of aspirin: Secondary | ICD-10-CM | POA: Diagnosis not present

## 2018-01-13 DIAGNOSIS — I10 Essential (primary) hypertension: Secondary | ICD-10-CM | POA: Diagnosis not present

## 2018-01-13 DIAGNOSIS — I2510 Atherosclerotic heart disease of native coronary artery without angina pectoris: Secondary | ICD-10-CM | POA: Diagnosis not present

## 2018-01-13 DIAGNOSIS — Z794 Long term (current) use of insulin: Secondary | ICD-10-CM | POA: Diagnosis not present

## 2018-01-16 ENCOUNTER — Other Ambulatory Visit: Payer: Self-pay

## 2018-01-16 NOTE — Patient Outreach (Addendum)
[facility]) Care Management  01/16/2018  [name] 1941-01-09 [id number]    EMMI-General discharge FOLLOW-UP ATTEMPT #2  RED ON [place] Day # 1 Date: 12/26/17 1:59 PM Red Alert Reason: "Scheduled follow-up? No"   Outreach FOLLOW-UP attempt # 2, unsuccessful. No answer. RN CM left HIPAA compliant voicemail message along with contact info.  Plan: RN CM will make 1 final outreach attempt to patient within 3-4 business days. RN CM will send unsuccessful outreach letter to patient.     Barb Merino, RN,CCM [facility] Care Management Care Management Coordinator Direct Phone: [phone] Toll Free: [phone] Fax: [phone]

## 2018-01-18 DIAGNOSIS — M25552 Pain in left hip: Secondary | ICD-10-CM | POA: Diagnosis not present

## 2018-01-19 ENCOUNTER — Other Ambulatory Visit: Payer: Self-pay

## 2018-01-19 NOTE — Patient Outreach (Signed)
[facility]) Care Management  01/19/2018  [name] 03-Jun-1940 [id number]    EMMI-General discharge FOLLOW-UP ATTEMPT #3  RED ON EMMI ALERT Day #1 Date:12/26/17 1:59 PM Red Alert Reason:"Scheduled follow-up? No"   Outreach FOLLOW- UP attempt # 3 unsuccessful. No answer from home # listed for patient. Multiple attempts to establish contact with patient without success. No response from letter mailed to patient. Case is being closed at this time.    Plan: RN CM will close case due to being unable to reach patient/wife and no return call from the patient/wife has been received.     Barb Merino, RN,CCM [facility] Care Management Care Management Coordinator Direct Phone: [phone] Toll Free: [phone] Fax: [phone]

## 2018-01-21 DIAGNOSIS — G4733 Obstructive sleep apnea (adult) (pediatric): Secondary | ICD-10-CM | POA: Diagnosis not present

## 2018-01-22 ENCOUNTER — Encounter: Payer: Self-pay | Admitting: Adult Health

## 2018-01-24 ENCOUNTER — Ambulatory Visit: Payer: Medicare Other | Admitting: Neurology

## 2018-01-24 ENCOUNTER — Encounter: Payer: Self-pay | Admitting: Neurology

## 2018-01-24 VITALS — BP 161/80 | HR 76 | Ht 66.0 in | Wt 219.0 lb

## 2018-01-24 DIAGNOSIS — Z9989 Dependence on other enabling machines and devices: Secondary | ICD-10-CM | POA: Diagnosis not present

## 2018-01-24 DIAGNOSIS — A86 Unspecified viral encephalitis: Secondary | ICD-10-CM | POA: Diagnosis not present

## 2018-01-24 DIAGNOSIS — G4733 Obstructive sleep apnea (adult) (pediatric): Secondary | ICD-10-CM

## 2018-01-24 DIAGNOSIS — G053 Encephalitis and encephalomyelitis in diseases classified elsewhere: Secondary | ICD-10-CM | POA: Diagnosis not present

## 2018-01-24 NOTE — Progress Notes (Addendum)
Subjective:    Patient ID: [name] is a 78 y.o. male.  HPI     Interim history:  [name] is a 78 year old right-handed gentleman with an underlying medical history of hypertension, CAD with s/p MI and stent placement, s/p multiple surgeries, including tonsillectomy as a child, cholecystectomy, low back surgery, right total hip replacement surgery, bilateral rotator cuff surgeries bilateral knee replacement surgeries, hyperlipidemia, diabetes, migraine headaches (seen by Dr. Jannifer Franklin in the past), BPH, allergies and obesity, who presents for follow-up consultation of his obstructive sleep apnea, after a recent sleep study for CPAP titration. The patient is accompanied by his wife again today. I last saw him on 10/06/2017, at which time we talked about his baseline sleep study results from 09/18/2017. He was amenable to treatment with CPAP and was therefore advised to return for a CPAP titration study. He had this on 10/25/17. I went over the test results.   Today, 01/24/2018: I reviewed his CPAP compliance data from 12/24/2017 through 01/22/2018 which is a total of 30 days, during which time he used his machine 24 days with a lapse noted in the first part secondary to hospitalization for meningoencephalitis. Average usage was 5 hours and 46 minutes, compliance percentage for more than 4 hours at 60%, slightly suboptimal but understandable secondary to hospitalization. AHI at goal at 3.2 per hour and average, leak on the lower end with the 95th percentile at 5.7 L/m on a pressure of 8 cm with EPR of 3. He reports feeling better with CPAP. He is motivated to continue with treatment. He feels improved as far as his daytime somnolence. His Epworth sleepiness score is 5 out of 24 today. His wife reports that he is doing better. Thankfully, he is back to baseline. Of note, he was recently hospitalized in December 2019 for altered mental status. His wife to take him to the emergency room twice in  December, as the first time he was sent home. 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 He had lumbar puncture with CSF testing which did not show any evidence of bacterial or HSV meningoencephalitis and antibiotics and antiviral medications were stopped. He did have increased protein and increased white cells in the CSF.  He had a brain MRI without contrast on 12/19/2017 and I reviewed the results: IMPRESSION: 1. Limited 3 sequence MRI of the head: No acute intracranial process. 2. Moderate parenchymal brain volume with superimposed image findings normal pressure hydrocephalus. 3. Moderate parenchymal brain volume loss.   Previously:   I first met him on 09/05/2017 at the request of his primary care physician, at which time the patient reported snoring and daytime somnolence. He was advised to proceed with a sleep study. He had a baseline sleep study on 09/18/2017. I went over his test results with him in detail today. Sleep efficiency was 80%, REM latency markedly delayed at 381 minutes but sleep latency was normal. Wake after sleep onset was 71 minutes. He had an increased percentage of stage II sleep, REM sleep markedly reduced at 6.5%. Total AHI was in the moderate range at 26.6 per hour, average oxygen saturation 92%, nadir was 86%. He had severe PLMS with mild arousals. Based on his medical history and test results I suggested we proceed with CPAP therapy and proceed with a sleep study for titration. He declined a CPAP titration study reporting that he was claustrophobic. He was offered a follow-up appointment.    09/05/2017: (He) reports snoring  and excessive daytime somnolence. His Epworth sleepiness score is 7 out of 24, fatigue score is 24 out of 63. He is retired, lives with his wife, they have 1 child. He quit smoking in 1990 and drinks alcohol infrequently and does not utilize caffeine on a regular or daily basis. I reviewed your office note  from 07/30/2011. He falls asleep during the day, typically in his recliner. His bedtime is around 10 and rise time around 8. He reports lack of energy and lack of motivation to do things that he previously enjoyed such as hunting. He is not aware of any family history of OSA. He denies morning headaches but has significant nocturia about 3 times per night on average.  His Past Medical History Is Significant For: Past Medical History:  Diagnosis Date  . Anxiety disorder   . Aortic atherosclerosis ([place]) 12/19/2017  . Arthritis   . Bursitis of hip   . Cervical spondylosis   . Chronic headaches   . Coronary atherosclerosis of native coronary artery    BMS nondominant RCA 12/2004  . Essential hypertension, benign   . GERD (gastroesophageal reflux disease)   . History of adenomatous polyp of colon   . History of kidney stones   . History of MI (myocardial infarction) 12/2004   s/p  BMS to RCA  . Hyperlipidemia   . Internal hemorrhoids   . Low back pain   . Nocturia more than twice per night   . Numbness of left foot   . OSA (obstructive sleep apnea)   . Recurrent headache   . S/p bare metal coronary artery stent 12/2004   BMS x1 to RCA  . Spinal stenosis   . Type 2 diabetes mellitus treated with insulin ([place])   . Urgency of urination   . Wears dentures    upper    His Past Surgical History Is Significant For: Past Surgical History:  Procedure Laterality Date  . CARDIAC CATHETERIZATION  06-14-2004  dr Lia Foyer   total occlusion mD1, RI 30-40%, mRCA nondominant hazy 80% and scattered 30-40%,  ef 71% (medical mangement)  . CARDIOVASCULAR STRESS TEST  09-17-2015   dr Domenic Polite   Low risk nuclear study w/ reversible mild small anteroapical defect,  normal LV function and wall motion, nuclear stress ef 61%  . CATARACT EXTRACTION W/ INTRAOCULAR LENS IMPLANT Right 07/2014  . COLONOSCOPY  09/10/2008   normal  . CORONARY ANGIOPLASTY WITH STENT PLACEMENT  12-17-2004   dr benismhon/ dr Olevia Perches    nonobstructive cad LAD and LCFx,  BMS x1 to total occlusion RCA   . CYSTOSCOPY W/ URETEROSCOPY W/ LITHOTRIPSY  08/2010  . ERCP  03/14/2008  . LAPAROSCOPIC CHOLECYSTECTOMY  05/2012  . LEFT HEART CATHETERIZATION WITH CORONARY ANGIOGRAM N/A 05/14/2011   Procedure: LEFT HEART CATHETERIZATION WITH CORONARY ANGIOGRAM;  Surgeon: Sherren Mocha, MD;  [facility] C[facility] LAB;  Service: Cardiovascular;  Laterality: N/A;    non-obstructive LM, LAD, LCFx, patent RCA stent  . LUMBAR LAMINECTOMY/DECOMPRESSION MICRODISCECTOMY N/A 02/24/2017   Procedure: Microlumbar decompression L4-5, L5-S1;  Surgeon: Susa Day, MD;  Location: [facility] ORS;  Service: Orthopedics;  Laterality: N/A;  120 mins  . ROTATOR CUFF REPAIR Right 03/2002  . ROTATOR CUFF REPAIR Left 12/11/2015  . TOTAL HIP ARTHROPLASTY Right 12/20/2008  . TOTAL KNEE ARTHROPLASTY Bilateral left 12-11-2003/  right 07-31-2004   dr Wynelle [facility]  . UPPER GASTROINTESTINAL ENDOSCOPY  01/08/2008   bx, inlet patch, duodenitis  . YAG LASER APPLICATION Right 05/16/3974   Procedure:  YAG LASER APPLICATION;  Surgeon: Williams Che, MD;  Location: [facility] ORS;  Service: Ophthalmology;  Laterality: Right;    His Family History Is Significant For: Family History  Problem Relation Age of Onset  . Colon cancer Mother        Diagnosed age 49  . Cancer Mother 71  . Heart disease Father   . Heart attack Father   . Breast cancer Sister   . Heart disease Brother   . Brain cancer Sister   . Heart disease Brother   . Bladder Cancer Brother   . Cancer - Other Brother     His Social History Is Significant For: Social History   Socioeconomic History  . Marital status: Married    Spouse name: Not on file  . Number of children: 0  . Years of education: HS  . Highest education level: High school graduate  Occupational History  . Occupation: RETIRED    Comment: Power plant  Social Needs  . Financial resource strain: Not hard at all  . Food insecurity:    Worry: Never  true    Inability: Never true  . Transportation needs:    Medical: No    Non-medical: No  Tobacco Use  . Smoking status: Former Smoker    Packs/day: 1.00    Years: 20.00    Pack years: 20.00    Types: Cigarettes    Start date: 01/18/1963    Last attempt to quit: 08/11/1988    Years since quitting: 29.4  . Smokeless tobacco: Former Systems developer    Types: Chew    Quit date: 01/11/1989  . Tobacco comment: chewed 1 pack tobacco/day for 15 years  Substance and Sexual Activity  . Alcohol use: No    Alcohol/week: 0.0 standard drinks    Frequency: Never  . Drug use: No  . Sexual activity: Yes  Lifestyle  . Physical activity:    Days per week: Not on file    Minutes per session: Not on file  . Stress: Not at all  Relationships  . Social connections:    Talks on phone: More than three times a week    Gets together: More than three times a week    Attends religious service: Never    Active member of club or organization: No    Attends meetings of clubs or organizations: Never    Relationship status: Married  Other Topics Concern  . Not on file  Social History Narrative   No regular exercise      Daily caffeine use: 3 cups   Patient is right handed.       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 Ace Inhibitors Hives, Swelling and Rash    Rash,hives,tongue swelling  . Angiotensin Receptor Blockers     Unknown reaction   . Ciprofloxacin Other (See Comments)    confusion  . Other Hives and Other (See Comments)    Altaseptic, altase (cough)  . Oxycodone-Acetaminophen Other (See Comments)    REACTION: unknown reaction  . Ramipril Cough  . Robaxin [Methocarbamol] Other (See Comments)    Confusion   . Toradol [Ketorolac Tromethamine] Other (See Comments)    confusion  . Valium Other (See Comments)    Hallucinations; confusion  . Doxycycline Hives and Rash  :   His Current Medications Are:   Outpatient Encounter Medications as of 01/24/2018  Medication Sig  .  acetaminophen (TYLENOL) 650 MG CR tablet Take 1,300 mg by mouth 2 (two) times daily as needed for pain.   Marland Kitchen amLODipine (NORVASC) 5 MG tablet Take 5 mg by mouth daily.  Marland Kitchen aspirin EC 81 MG tablet Take 81 mg by mouth every evening.  Marland Kitchen atorvastatin (LIPITOR) 20 MG tablet TAKE 1 TABLET BY MOUTH  DAILY AT 6 PM. (Patient taking differently: Take 20 mg by mouth every evening. )  . butalbital-acetaminophen-caffeine (FIORICET, ESGIC) 50-325-40 MG tablet Take 1 tablet by mouth 2 (two) times daily as needed for headache.  . cetirizine (ZYRTEC) 10 MG tablet Take 10 mg by mouth every evening.   . Cholecalciferol (VITAMIN D3) 2000 UNITS TABS Take 2,000 Units by mouth every morning.   . docusate sodium (COLACE) 100 MG capsule Take 1 capsule (100 mg total) by mouth 2 (two) times daily.  . famotidine (PEPCID) 20 MG tablet TAKE 1 TABLET BY MOUTH TWO  TIMES DAILY (Patient taking differently: Take 20 mg by mouth 2 (two) times daily. )  . HUMALOG KWIKPEN 100 UNIT/ML KwikPen INJECT 0 TO 15 UNITS  SUBCUTANEOUSLY 3 TIMES  DAILY BEFORE MEALS  (70-150=8 UNITS, 151-200=9  UNITS) (Patient taking differently: Inject 0-10 Units into the skin 3 (three) times daily before meals. )  . LANTUS SOLOSTAR 100 UNIT/ML Solostar Pen INJECT SUBCUTANEOUSLY 52  UNITS AT BEDTIME (Patient taking differently: Inject 52 Units into the skin at bedtime. )  . losartan (COZAAR) 50 MG tablet TAKE 1 TABLET BY MOUTH  DAILY (Patient taking differently: Take 50 mg by mouth every morning. )  . Menthol, Topical Analgesic, (BLUE-EMU MAXIMUM STRENGTH EX) Apply 1 application topically daily as needed (muscle pain).  . metFORMIN (GLUCOPHAGE-XR) 500 MG 24 hr tablet TAKE 2 TABLETS BY MOUTH TWO TIMES DAILY (Patient taking differently: Take 1,000 mg by mouth 2 (two) times daily. )  . nitroGLYCERIN (NITROSTAT) 0.4 MG SL tablet DISSOLVE ONE TABLET UNDER THE TONGUE EVERY 5 MINUTES AS NEEDED FOR CHEST  PAIN.  DO NOT EXCEED A TOTAL OF 3 DOSES IN 15 MINUTES (Patient taking differently: Place 0.4 mg under the tongue every 5 (five) minutes as needed. DISSOLVE ONE TABLET UNDER THE TONGUE EVERY 5 MINUTES AS NEEDED FOR CHEST PAIN.  DO NOT EXCEED A TOTAL OF 3 DOSES IN 15 MINUTES)  . ONE TOUCH ULTRA TEST test strip USE 1 STRIP TO CHECK GLUCOSE 4 TIMES DAILY  . PARoxetine (PAXIL) 20 MG tablet Take 0.5 tablets (10 mg total) by mouth daily. (Patient taking differently: Take 20 mg by mouth daily. )  . polyethylene glycol (MIRALAX / GLYCOLAX) packet Take 17 g by mouth daily. (Patient taking differently: Take 17 g by mouth daily as needed for mild constipation. )  . polyvinyl alcohol (LIQUIFILM TEARS) 1.4 % ophthalmic solution Place 1 drop into both eyes as needed for dry eyes.  . tamsulosin (FLOMAX) 0.4 MG CAPS capsule TAKE 1 CAPSULE BY MOUTH  DAILY (Patient taking differently: Take 0.4 mg by mouth every evening. )  . vitamin B-12 (CYANOCOBALAMIN) 1000 MCG tablet Take 2 tablets (2,000 mcg total) by mouth daily.  . [DISCONTINUED] amLODipine (NORVASC) 10 MG tablet Take 1 tablet (10 mg total) by mouth daily.  . [DISCONTINUED] predniSONE (DELTASONE) 20 MG tablet 2 po at same time daily for 5 days  . [DISCONTINUED] QUEtiapine (SEROQUEL) 25 MG tablet Take 1 tablet (25 mg total) by mouth 2 (two) times daily.   No facility-administered encounter medications on file as of 01/24/2018.   :  Review  of Systems:  Out of a complete 14 point review of systems, all are reviewed and negative with the exception of these symptoms as listed below:  Review of Systems  Neurological:       Pt presents today to discuss his cpap. Pt reports that he does feel a little better on PAP therapy. He was in the hospital for about one week and was unable to use his cpap.   Epworth Sleepiness Scale 0= would never doze 1= slight chance of dozing 2= moderate chance of dozing 3= high chance of dozing  Sitting and reading: 1 Watching TV:  1 Sitting inactive in a public place (ex. Theater or meeting): 0 As a passenger in a car for an hour without a break: 1 Lying down to rest in the afternoon: 1 Sitting and talking to someone: 0 Sitting quietly after lunch (no alcohol): 1 In a car, while stopped in traffic: 0 Total: 5     Objective:  Neurological Exam  Physical Exam Physical Examination:   Vitals:   01/24/18 0943  BP: (!) 161/80  Pulse: 76    General Examination: The patient is a very pleasant 78 y.o. male in no acute distress. He appears well-developed and well-nourished and well groomed.   HEENT:Normocephalic, atraumatic, pupils are equal, round and reactive to light and accommodation. Extraocular tracking is good without limitation to gaze excursion or nystagmus noted. Normal smooth pursuit is noted. Hearing is grossly intact, perhaps mildly impaired. Face is symmetric with normal facial animation and normal facial sensation. Speech is clear with no dysarthria noted. There is no hypophonia. Oropharynx exam reveals: mildmouth dryness, adequatedental hygiene, with full dentures on top and permanent bridge in the front on the bottom. He has moderate airway crowding.  Chest:Clear to auscultation without wheezing, rhonchi or crackles noted.  Heart:S1+S2+0, regular and normal without murmurs, rubs or gallops noted.   Abdomen:Soft, non-tender and non-distended with normal bowel sounds appreciated on auscultation.  Extremities:There isnopitting edema in the distal lower extremities bilaterally.   Skin: Warm and dry without trophic changes noted.  Musculoskeletal: exam reveals no obvious joint deformities, tenderness or joint swelling or erythema, of note, he is status post right total hip replacement, he is status post bilateral knee replacement surgeries.  Neurologically:  Mental status: The patient is awake, alert and oriented in all 4 spheres.Hisimmediate and remote memory, attention, language  skills and fund of knowledge are appropriate. There is no evidence of aphasia, agnosia, apraxia or anomia. Speech is clear with normal prosody and enunciation. Thought process is linear. Mood is normaland affect is normal.  Cranial nerves II - XII are as described above under HEENT exam.  Motor exam: Normal bulk, strength and tone is noted. There is no tremor or rebound. Fine motor skills and coordination:grosslyintact.  Cerebellar testing: No dysmetria or intention tremor. There is no truncal or gait ataxia.  Sensory exam: intact to light touch in the upper and lower extremities.  Gait, station and balance:Hestands easily. No veering to one side is noted. No leaning to one side is noted. Posture is age-appropriate and stance is narrow based. Gait showsnormalstride length and normalpace. No problems turning are noted.   Assessmentand Plan:  In summary,Faizon R Hawkinsis a very pleasant 47 year oldmalewith an underlying medical history of hypertension, CAD with s/p MI and stent placement, s/p multiple surgeries, including tonsillectomy as a child, cholecystectomy, low back surgery, right total hip replacement surgery, bilateral rotator cuff surgeries bilateral knee replacement surgeries, hyperlipidemia, diabetes, migraine headaches, BPH,  allergies, obesity and recent hospitalization for altered mental status in December 2019, with diagnosis of viral meningoencephalitis, who presents for follow-up consultation of his obstructive sleep apnea. His baseline sleep study recently showed evidence of moderate to severe obstructive sleep apnea. We talked at length about the most recent test results, including his hospital test results but also his CPAP titration results from October 2019. He has adapted well to CPAP therapy. He is well on his way to full compliance, unfortunately, he did have a setback because of his hospitalization and could not use his CPAP at the time which is  understandable. He is motivated to continue with treatment and has benefited from it. His daytime energy is better, sleepiness less during the day and his sleep quality at night has improved. He is highly commended for his treatment adherence, especially in light of his previous apprehension about starting CPAP treatment. His wife is reminded to call our office so I can pull another download in about 2-3 weeks. He is reminded to be fully compliant with treatment and follow-up routinely with one of our nurse practitioners in 6 months, sooner if needed. I answered all their questions today and the patient and his wife were in agreement.  Addendum, 02/08/2018: I reviewed his most recent CPAP compliance data from 01/09/2018 through 02/07/2018, which is a total of 30 days, during which time he used his CPAP every night with percent used days greater than 4 hours at 93%, indicating excellent compliance with an average usage of 7 hours and 3 minutes which is very good, residual AHI at goal at 2.6 per hour, leak on the low side with the 95th percentile at 7.1 L/m on a pressure of 8 cm with EPR of 3. We will call patient to update him or his wife regarding his CPAP compliance. He can follow-up as planned with Ward Givens, NP.

## 2018-01-24 NOTE — Patient Instructions (Addendum)
You have done well thus far with your CPAP, keep up the good work. Also, please call our office in 2-3 week to remind [place] to pull another compliance report, I would like to review a download, so to document compliance, as far as insurance requirement.  Otherwise, we will see you back in about 6 months, you can see the Nurse Practitioner.

## 2018-02-02 ENCOUNTER — Ambulatory Visit: Payer: Medicare Other | Admitting: Family Medicine

## 2018-02-02 DIAGNOSIS — M25552 Pain in left hip: Secondary | ICD-10-CM | POA: Diagnosis not present

## 2018-02-06 ENCOUNTER — Ambulatory Visit (INDEPENDENT_AMBULATORY_CARE_PROVIDER_SITE_OTHER): Payer: Medicare Other | Admitting: Family Medicine

## 2018-02-06 ENCOUNTER — Encounter: Payer: Self-pay | Admitting: Family Medicine

## 2018-02-06 VITALS — BP 149/72 | HR 76 | Temp 97.0°F | Ht 66.0 in | Wt 220.6 lb

## 2018-02-06 DIAGNOSIS — E1159 Type 2 diabetes mellitus with other circulatory complications: Secondary | ICD-10-CM | POA: Diagnosis not present

## 2018-02-06 DIAGNOSIS — R351 Nocturia: Secondary | ICD-10-CM

## 2018-02-06 DIAGNOSIS — N401 Enlarged prostate with lower urinary tract symptoms: Secondary | ICD-10-CM

## 2018-02-06 DIAGNOSIS — K219 Gastro-esophageal reflux disease without esophagitis: Secondary | ICD-10-CM | POA: Diagnosis not present

## 2018-02-06 DIAGNOSIS — E1169 Type 2 diabetes mellitus with other specified complication: Secondary | ICD-10-CM | POA: Diagnosis not present

## 2018-02-06 DIAGNOSIS — I10 Essential (primary) hypertension: Secondary | ICD-10-CM

## 2018-02-06 DIAGNOSIS — E1129 Type 2 diabetes mellitus with other diabetic kidney complication: Secondary | ICD-10-CM

## 2018-02-06 DIAGNOSIS — R809 Proteinuria, unspecified: Secondary | ICD-10-CM

## 2018-02-06 DIAGNOSIS — Z794 Long term (current) use of insulin: Secondary | ICD-10-CM

## 2018-02-06 DIAGNOSIS — E785 Hyperlipidemia, unspecified: Secondary | ICD-10-CM

## 2018-02-06 DIAGNOSIS — I152 Hypertension secondary to endocrine disorders: Secondary | ICD-10-CM

## 2018-02-06 LAB — BAYER DCA HB A1C WAIVED: HB A1C (BAYER DCA - WAIVED): 7.1 % — ABNORMAL HIGH (ref <7.0)

## 2018-02-06 MED ORDER — LOSARTAN POTASSIUM 50 MG PO TABS: 50.0000 mg | ORAL_TABLET | Freq: Every morning | ORAL | 3 refills | Status: DC

## 2018-02-06 NOTE — Progress Notes (Signed)
BP (!) 149/72   Pulse 76   Temp (!) 97 F (36.1 C) (Oral)   Ht 5\' 6"  (1.676 m)   Wt 220 lb 9.6 oz (100.1 kg)   BMI 35.61 kg/m    Subjective:    Patient ID: [name], male    DOB: 1940/12/22, 78 y.o.   MRN: [id number]  HPI: [name] is a 78 y.o. male presenting on 02/06/2018 for Diabetes (3 month follow up); Hypertension; and Hip Pain (left - Patient states it has been going on since September)   HPI Type 2 diabetes mellitus Patient comes in today for recheck of his diabetes. Patient has been currently taking Lantus and Humalog and metformin. Patient is currently on an ACE inhibitor/ARB. Patient has not seen an ophthalmologist this year. Patient denies any issues with their feet.   Hypertension Patient is currently on amlodipine and losartan, and their blood pressure today is 149/72. Patient denies any lightheadedness or dizziness. Patient denies headaches, blurred vision, chest pains, shortness of breath, or weakness. Denies any side effects from medication and is content with current medication.   Hyperlipidemia Patient is coming in for recheck of his hyperlipidemia. The patient is currently taking lipitor. They deny any issues with myalgias or history of liver damage from it. They deny any focal numbness or weakness or chest pain.   GERD Patient is currently on famotidine.  She denies any major symptoms or abdominal pain or belching or burping. She denies any blood in her stool or lightheadedness or dizziness.   Relevant past medical, surgical, family and social history reviewed and updated as indicated. Interim medical history since our last visit reviewed. Allergies and medications reviewed and updated.  Review of Systems  Constitutional: Negative for chills and fever.  Eyes: Negative for visual disturbance.  Respiratory: Negative for shortness of breath and wheezing.   Cardiovascular: Negative for chest pain and leg swelling.  Musculoskeletal: Negative  for back pain and gait problem.  Skin: Negative for rash.  Neurological: Negative for dizziness, weakness, light-headedness and numbness.  All other systems reviewed and are negative.  Per HPI unless specifically indicated above  Allergies as of 02/06/2018      Reactions   Ace Inhibitors Hives, Swelling, Rash   Rash,hives,tongue swelling   Angiotensin Receptor Blockers    Unknown reaction    Ciprofloxacin Other (See Comments)   confusion   Other Hives, Other (See Comments)   Altaseptic, altase (cough)   Oxycodone-acetaminophen Other (See Comments)   REACTION: unknown reaction   Ramipril Cough   Robaxin [methocarbamol] Other (See Comments)   Confusion    Toradol [ketorolac Tromethamine] Other (See Comments)   confusion   Valium Other (See Comments)   Hallucinations; confusion   Doxycycline Hives, Rash      Medication List       Accurate as of February 06, 2018  8:58 AM. Always use your most recent med list.        acetaminophen 650 MG CR tablet Commonly known as:  TYLENOL Take 1,300 mg by mouth 2 (two) times daily as needed for pain.   amLODipine 5 MG tablet Commonly known as:  NORVASC Take 5 mg by mouth daily.   aspirin EC 81 MG tablet Take 81 mg by mouth every evening.   atorvastatin 20 MG tablet Commonly known as:  LIPITOR TAKE 1 TABLET BY MOUTH  DAILY AT 6 PM.   BLUE-EMU MAXIMUM STRENGTH EX Apply 1 application topically daily as needed (muscle pain).  butalbital-acetaminophen-caffeine 50-325-40 MG tablet Commonly known as:  FIORICET, ESGIC Take 1 tablet by mouth 2 (two) times daily as needed for headache.   cetirizine 10 MG tablet Commonly known as:  ZYRTEC Take 10 mg by mouth every evening.   docusate sodium 100 MG capsule Commonly known as:  COLACE Take 1 capsule (100 mg total) by mouth 2 (two) times daily.   famotidine 20 MG tablet Commonly known as:  PEPCID TAKE 1 TABLET BY MOUTH TWO  TIMES DAILY   HUMALOG KWIKPEN 100 UNIT/ML  KwikPen Generic drug:  insulin lispro INJECT 0 TO 15 UNITS  SUBCUTANEOUSLY 3 TIMES  DAILY BEFORE MEALS  (70-150=8 UNITS, 151-200=9  UNITS)   LANTUS SOLOSTAR 100 UNIT/ML Solostar Pen Generic drug:  Insulin Glargine INJECT SUBCUTANEOUSLY 52  UNITS AT BEDTIME   losartan 50 MG tablet Commonly known as:  COZAAR Take 1 tablet (50 mg total) by mouth every morning.   metFORMIN 500 MG 24 hr tablet Commonly known as:  GLUCOPHAGE-XR TAKE 2 TABLETS BY MOUTH TWO TIMES DAILY   nitroGLYCERIN 0.4 MG SL tablet Commonly known as:  NITROSTAT DISSOLVE ONE TABLET UNDER THE TONGUE EVERY 5 MINUTES AS NEEDED FOR CHEST PAIN.  DO NOT EXCEED A TOTAL OF 3 DOSES IN 15 MINUTES   ONE TOUCH ULTRA TEST test strip Generic drug:  glucose blood USE 1 STRIP TO CHECK GLUCOSE 4 TIMES DAILY   PARoxetine 20 MG tablet Commonly known as:  PAXIL Take 0.5 tablets (10 mg total) by mouth daily.   polyethylene glycol packet Commonly known as:  MIRALAX / GLYCOLAX Take 17 g by mouth daily.   polyvinyl alcohol 1.4 % ophthalmic solution Commonly known as:  LIQUIFILM TEARS Place 1 drop into both eyes as needed for dry eyes.   tamsulosin 0.4 MG Caps capsule Commonly known as:  FLOMAX TAKE 1 CAPSULE BY MOUTH  DAILY   vitamin B-12 1000 MCG tablet Commonly known as:  CYANOCOBALAMIN Take 2 tablets (2,000 mcg total) by mouth daily.   Vitamin D3 50 MCG (2000 UT) Tabs Take 2,000 Units by mouth every morning.          Objective:    BP (!) 149/72   Pulse 76   Temp (!) 97 F (36.1 C) (Oral)   Ht 5\' 6"  (1.676 m)   Wt 220 lb 9.6 oz (100.1 kg)   BMI 35.61 kg/m   Wt Readings from Last 3 Encounters:  02/06/18 220 lb 9.6 oz (100.1 kg)  01/24/18 219 lb (99.3 kg)  12/30/17 208 lb (94.3 kg)    Physical Exam Vitals signs and nursing note reviewed.  Constitutional:      General: He is not in acute distress.    Appearance: He is well-developed. He is not diaphoretic.  Eyes:     General: No scleral icterus.     Conjunctiva/sclera: Conjunctivae normal.  Neck:     Musculoskeletal: Neck supple.     Thyroid: No thyromegaly.  Cardiovascular:     Rate and Rhythm: Normal rate and regular rhythm.     Heart sounds: Normal heart sounds. No murmur.  Pulmonary:     Effort: Pulmonary effort is normal. No respiratory distress.     Breath sounds: Normal breath sounds. No wheezing.  Musculoskeletal: Normal range of motion.  Lymphadenopathy:     Cervical: No cervical adenopathy.  Skin:    General: Skin is warm and dry.     Findings: No rash.  Neurological:     Mental Status: He is alert  and oriented to person, place, and time.     Coordination: Coordination normal.  Psychiatric:        Behavior: Behavior normal.       Assessment & Plan:   Problem List Items Addressed This Visit      Cardiovascular and Mediastinum   Hypertension associated with diabetes ([place])   Relevant Medications   losartan (COZAAR) 50 MG tablet   Type 2 diabetes mellitus with circulatory disorder (HCC) - Primary   Relevant Medications   losartan (COZAAR) 50 MG tablet   Other Relevant Orders   [facility] DCA Hb A1c Waived     Digestive   GERD (gastroesophageal reflux disease)     Endocrine   Hyperlipidemia associated with type 2 diabetes mellitus (HCC)   Relevant Medications   losartan (COZAAR) 50 MG tablet   Microalbuminuria due to type 2 diabetes mellitus (HCC)   Relevant Medications   losartan (COZAAR) 50 MG tablet     Genitourinary   BPH (benign prostatic hyperplasia)      Continue current medications and check A1c Follow up plan: Return in about 3 months (around 05/08/2018), or if symptoms worsen or fail to improve, for Diabetes and hypertension and cholesterol.  Counseling provided for all of the vaccine components Orders Placed This Encounter  Procedures  . [facility] Hb A1c Campbell, MD [facility] 02/06/2018, 8:58 AM

## 2018-02-08 ENCOUNTER — Telehealth: Payer: Self-pay | Admitting: Neurology

## 2018-02-08 NOTE — Telephone Encounter (Signed)
I called pt's wife per DPR and discussed Dr. Guadelupe Sabin recommendations for the pt regarding his cpap. Pt's wife verbalized understanding and will follow up as planned with Jinny Blossom, NP.

## 2018-02-08 NOTE — Telephone Encounter (Signed)
I have let [place] know that pt has met compliance and asked them to reach out to pt to discuss insurance requirements for compliance in the future.  I have printed a 30 day download for Dr. Rexene Alberts to review.

## 2018-02-08 NOTE — Telephone Encounter (Signed)
I reviewed his most recent CPAP compliance data from 01/09/2018 through 02/07/2018, which is a total of 30 days, during which time he used his CPAP every night with percent used days greater than 4 hours at 93%, indicating excellent compliance with an average usage of 7 hours and 3 minutes which is very good, residual AHI at goal at 2.6 per hour, leak on the low side with the 95th percentile at 7.1 L/m on a pressure of 8 cm with EPR of 3. Please call patient to update him or his wife regarding his CPAP compliance. He is commended for his treatment adherence and can follow-up as planned with [facility]

## 2018-02-08 NOTE — Telephone Encounter (Signed)
Pts. Wife called stating that they were told to call RN to see if another report can be processed to send to the ins for the CPAP to be covered. Please advise.

## 2018-02-20 ENCOUNTER — Other Ambulatory Visit: Payer: Self-pay

## 2018-02-20 DIAGNOSIS — Z01818 Encounter for other preprocedural examination: Secondary | ICD-10-CM

## 2018-02-20 NOTE — Progress Notes (Signed)
Referral placed for surgical clearance per Dr. Warrick Parisian.

## 2018-02-21 ENCOUNTER — Telehealth: Payer: Self-pay | Admitting: *Deleted

## 2018-02-21 DIAGNOSIS — G4733 Obstructive sleep apnea (adult) (pediatric): Secondary | ICD-10-CM | POA: Diagnosis not present

## 2018-02-21 NOTE — Telephone Encounter (Signed)
Patient contacted to get update on status of health since last office visit. Patient denies chest pain, dizziness, sob, n/v, swelling. Patient said the only problem is his left hip pain. Dr. Peri Maris office contacted to send surgical clearance request to our office.

## 2018-02-22 ENCOUNTER — Telehealth: Payer: Self-pay

## 2018-02-22 NOTE — Telephone Encounter (Signed)
Okay sounds good, please Janett Billow can you get the form to his cardiology office or a copy of it.

## 2018-02-22 NOTE — Telephone Encounter (Signed)
Email sent to surgon to fax clearance to Cardiologist.

## 2018-02-22 NOTE — Telephone Encounter (Signed)
You sent a referral for cardiology for surgical clearance  This is our patient and he was seen recently so if we had the form for surgical clearance we probably could do without him being seen again

## 2018-02-28 ENCOUNTER — Ambulatory Visit: Payer: Medicare Other | Admitting: Neurology

## 2018-02-28 ENCOUNTER — Other Ambulatory Visit: Payer: Self-pay | Admitting: Family Medicine

## 2018-02-28 ENCOUNTER — Encounter: Payer: Self-pay | Admitting: Neurology

## 2018-02-28 VITALS — BP 132/74 | HR 80 | Ht 66.0 in | Wt 229.0 lb

## 2018-02-28 DIAGNOSIS — Z8661 Personal history of infections of the central nervous system: Secondary | ICD-10-CM | POA: Diagnosis not present

## 2018-02-28 DIAGNOSIS — R809 Proteinuria, unspecified: Secondary | ICD-10-CM

## 2018-02-28 DIAGNOSIS — I10 Essential (primary) hypertension: Secondary | ICD-10-CM

## 2018-02-28 DIAGNOSIS — Z794 Long term (current) use of insulin: Principal | ICD-10-CM

## 2018-02-28 DIAGNOSIS — E538 Deficiency of other specified B group vitamins: Secondary | ICD-10-CM | POA: Diagnosis not present

## 2018-02-28 DIAGNOSIS — E1129 Type 2 diabetes mellitus with other diabetic kidney complication: Secondary | ICD-10-CM

## 2018-02-28 DIAGNOSIS — E1159 Type 2 diabetes mellitus with other circulatory complications: Secondary | ICD-10-CM

## 2018-02-28 DIAGNOSIS — I152 Hypertension secondary to endocrine disorders: Secondary | ICD-10-CM

## 2018-02-28 HISTORY — DX: Personal history of infections of the central nervous system: Z86.61

## 2018-02-28 NOTE — Progress Notes (Signed)
Reason for visit: Altered mental status  Referring physician: [facility] [name] is a 78 y.o. male  History of present illness:  [name] is a 78 year old right-handed white male with a history of obstructive sleep apnea on CPAP, followed through this office.  The patient was admitted to the hospital on 14 December 2017 with altered mental status.  The patient was out to dinner with his wife when she noted that he was not acting right.  He seemed to be spaced out, he could not find his drink on the table, when given the drink, he placed it back down in the middle of his plate.  She took him to the hospital and he underwent a stroke work-up to include MRI of the brain that was unremarkable, and a CT angiogram of the head and neck was done without evidence of acute blockages.  The patient was discharged home, but the wife continued to note that he was not acting properly, he seemed confused and spaced out, he was complaining of headache and he was running low-grade fevers.  The patient was taken back to the hospital on 18 December 2017.  MRI of the brain done again was unremarkable, the patient eventually underwent a lumbar puncture which revealed 275 white blood cells, predominantly lymphocytes.  Glucose was 78, protein was 97.  The patient was felt to have a viral meningitis, over time, he has improved with his mental status and has returned to baseline.  The patient does have a prior history of headaches, he has been seen through this office in the past for headache, but he has not had any headaches since December.  The patient reports no focal numbness or weakness of the face, arms, legs.  He denies issues controlling the bowels or the bladder.  He does have some left hip pain and will be potentially having surgery for this in the near future.  The patient was noted to have a low vitamin B12 level in the hospital, he has been placed on oral supplementation.  Past Medical History:    Diagnosis Date  . Anxiety disorder   . Aortic atherosclerosis ([place]) 12/19/2017  . Arthritis   . Bursitis of hip   . Cervical spondylosis   . Chronic headaches   . Coronary atherosclerosis of native coronary artery    BMS nondominant RCA 12/2004  . Essential hypertension, benign   . GERD (gastroesophageal reflux disease)   . History of adenomatous polyp of colon   . History of kidney stones   . History of MI (myocardial infarction) 12/2004   s/p  BMS to RCA  . Hyperlipidemia   . Hypertension   . Internal hemorrhoids   . Low back pain   . Nocturia more than twice per night   . Numbness of left foot   . OSA (obstructive sleep apnea)   . Recurrent headache   . S/p bare metal coronary artery stent 12/2004   BMS x1 to RCA  . Spinal stenosis   . Type 2 diabetes mellitus treated with insulin ([place])   . Urgency of urination   . Wears dentures    upper    Past Surgical History:  Procedure Laterality Date  . CARDIAC CATHETERIZATION  06-14-2004  dr Lia Foyer   total occlusion mD1, RI 30-40%, mRCA nondominant hazy 80% and scattered 30-40%,  ef 71% (medical mangement)  . CARDIOVASCULAR STRESS TEST  09-17-2015   dr Domenic Polite   Low risk nuclear study w/  reversible mild small anteroapical defect,  normal LV function and wall motion, nuclear stress ef 61%  . CATARACT EXTRACTION W/ INTRAOCULAR LENS IMPLANT Right 07/2014  . CHOLECYSTECTOMY    . COLONOSCOPY  09/10/2008   normal  . CORONARY ANGIOPLASTY WITH STENT PLACEMENT  12-17-2004   dr benismhon/ dr Olevia Perches   nonobstructive cad LAD and LCFx,  BMS x1 to total occlusion RCA   . CYSTOSCOPY W/ URETEROSCOPY W/ LITHOTRIPSY  08/2010  . ERCP  03/14/2008  . LAMINECTOMY    . LAPAROSCOPIC CHOLECYSTECTOMY  05/2012  . LEFT HEART CATHETERIZATION WITH CORONARY ANGIOGRAM N/A 05/14/2011   Procedure: LEFT HEART CATHETERIZATION WITH CORONARY ANGIOGRAM;  Surgeon: Sherren Mocha, MD;  Location: [facility] C[facility] LAB;  Service: Cardiovascular;  Laterality: N/A;     non-obstructive LM, LAD, LCFx, patent RCA stent  . LUMBAR LAMINECTOMY/DECOMPRESSION MICRODISCECTOMY N/A 02/24/2017   Procedure: Microlumbar decompression L4-5, L5-S1;  Surgeon: Susa Day, MD;  Location: [facility] ORS;  Service: Orthopedics;  Laterality: N/A;  120 mins  . ROTATOR CUFF REPAIR Right 03/2002  . ROTATOR CUFF REPAIR Left 12/11/2015  . TOTAL HIP ARTHROPLASTY Right 12/20/2008  . TOTAL KNEE ARTHROPLASTY Bilateral left 12-11-2003/  right 07-31-2004   dr Wynelle Link [facility]  . UPPER GASTROINTESTINAL ENDOSCOPY  01/08/2008   bx, inlet patch, duodenitis  . YAG LASER APPLICATION Right 5/00/9381   Procedure: YAG LASER APPLICATION;  Surgeon: Williams Che, MD;  Location: [facility] ORS;  Service: Ophthalmology;  Laterality: Right;    Family History  Problem Relation Age of Onset  . Colon cancer Mother        Diagnosed age 40  . Cancer Mother 38  . Heart disease Father   . Heart attack Father   . Breast cancer Sister   . Heart disease Brother   . Brain cancer Sister   . Heart disease Brother   . Bladder Cancer Brother   . Cancer - Other Brother     Social history:  reports that he quit smoking about 29 years ago. His smoking use included cigarettes. He started smoking about 55 years ago. He has a 20.00 pack-year smoking history. He quit smokeless tobacco use about 29 years ago.  His smokeless tobacco use included chew. He reports that he does not drink alcohol or use drugs.  Medications:  Prior to Admission medications   Medication Sig Start Date End Date Taking? Authorizing Provider  acetaminophen (TYLENOL) 650 MG CR tablet Take 1,300 mg by mouth 2 (two) times daily as needed for pain.    Yes [provider]  amLODipine (NORVASC) 5 MG tablet Take 5 mg by mouth daily.   Yes [provider]  aspirin EC 81 MG tablet Take 81 mg by mouth every evening.   Yes [provider]  atorvastatin (LIPITOR) 20 MG tablet TAKE 1 TABLET BY MOUTH  DAILY AT 6 PM. Patient taking  differently: Take 20 mg by mouth every evening.  11/30/17  Yes Dettinger, Fransisca Kaufmann, MD  butalbital-acetaminophen-caffeine (FIORICET, ESGIC) [id number] MG tablet Take 1 tablet by mouth 2 (two) times daily as needed for headache. 01/17/17  Yes Terald Sleeper, PA-C  cetirizine (ZYRTEC) 10 MG tablet Take 10 mg by mouth every evening.    Yes [provider]  Cholecalciferol (VITAMIN D3) 2000 UNITS TABS Take 2,000 Units by mouth every morning.    Yes [provider]  famotidine (PEPCID) 20 MG tablet TAKE 1 TABLET BY MOUTH TWO  TIMES DAILY Patient taking differently: Take 20 mg by mouth  2 (two) times daily.  11/30/17  Yes Dettinger, Fransisca Kaufmann, MD  HUMALOG KWIKPEN 100 UNIT/ML KwikPen INJECT 0 TO 15 UNITS  SUBCUTANEOUSLY 3 TIMES  DAILY BEFORE MEALS  (70-150=8 UNITS, 151-200=9  UNITS) Patient taking differently: Inject 0-10 Units into the skin 3 (three) times daily before meals.  11/30/17  Yes Dettinger, Fransisca Kaufmann, MD  LANTUS SOLOSTAR 100 UNIT/ML Solostar Pen INJECT SUBCUTANEOUSLY 52  UNITS AT BEDTIME Patient taking differently: Inject 52 Units into the skin at bedtime.  07/11/17  Yes Dettinger, Fransisca Kaufmann, MD  losartan (COZAAR) 50 MG tablet TAKE 1 TABLET BY MOUTH  DAILY 02/28/18  Yes Dettinger, Fransisca Kaufmann, MD  Menthol, Topical Analgesic, (BLUE-EMU MAXIMUM STRENGTH EX) Apply 1 application topically daily as needed (muscle pain).   Yes [provider]  metFORMIN (GLUCOPHAGE-XR) 500 MG 24 hr tablet TAKE 2 TABLETS BY MOUTH TWO TIMES DAILY Patient taking differently: Take 1,000 mg by mouth 2 (two) times daily.  11/30/17  Yes Dettinger, Fransisca Kaufmann, MD  nitroGLYCERIN (NITROSTAT) 0.4 MG SL tablet DISSOLVE ONE TABLET UNDER THE TONGUE EVERY 5 MINUTES AS NEEDED FOR CHEST PAIN.  DO NOT EXCEED A TOTAL OF 3 DOSES IN 15 MINUTES Patient taking differently: Place 0.4 mg under the tongue every 5 (five) minutes as needed. DISSOLVE ONE TABLET UNDER THE TONGUE EVERY 5 MINUTES AS NEEDED FOR CHEST PAIN.  DO NOT EXCEED A  TOTAL OF 3 DOSES IN 15 MINUTES 02/03/17  Yes Satira Sark, MD  ONE TOUCH ULTRA TEST test strip USE 1 STRIP TO CHECK GLUCOSE 4 TIMES DAILY 09/05/17  Yes Dettinger, Fransisca Kaufmann, MD  PARoxetine (PAXIL) 20 MG tablet Take 0.5 tablets (10 mg total) by mouth daily. Patient taking differently: Take 20 mg by mouth daily.  12/23/17  Yes Barton Dubois, MD  polyethylene glycol [facility] / GLYCOLAX) packet Take 17 g by mouth daily. Patient taking differently: Take 17 g by mouth daily as needed for mild constipation.  02/24/17  Yes Susa Day, MD  polyvinyl alcohol (LIQUIFILM TEARS) 1.4 % ophthalmic solution Place 1 drop into both eyes as needed for dry eyes. 12/23/17  Yes Barton Dubois, MD  tamsulosin (FLOMAX) 0.4 MG CAPS capsule TAKE 1 CAPSULE BY MOUTH  DAILY Patient taking differently: Take 0.4 mg by mouth every evening.  11/30/17  Yes Dettinger, Fransisca Kaufmann, MD  vitamin B-12 (CYANOCOBALAMIN) 1000 MCG tablet Take 2 tablets (2,000 mcg total) by mouth daily. 12/23/17  Yes Barton Dubois, MD      Allergies  Allergen Reactions  . Ace Inhibitors Hives, Swelling and Rash    Rash,hives,tongue swelling  . Angiotensin Receptor Blockers     Unknown reaction   . Ciprofloxacin Other (See Comments)    confusion  . Other Hives and Other (See Comments)    Altaseptic, altase (cough)  . Oxycodone-Acetaminophen Other (See Comments)    REACTION: unknown reaction  . Ramipril Cough  . Robaxin [Methocarbamol] Other (See Comments)    Confusion   . Toradol [Ketorolac Tromethamine] Other (See Comments)    confusion  . Valium Other (See Comments)    Hallucinations; confusion  . Doxycycline Hives and Rash    ROS:  Out of a complete 14 system review of symptoms, the patient complains only of the following symptoms, and all other reviewed systems are negative.  Weight gain, fatigue Swelling in the legs Hearing loss Blurred vision Urination problems Easy bruising, easy bleeding Joint pain Allergies Memory  loss Anxiety, too much sleep, decreased energy Restless legs  Blood  pressure (!) 155/71, pulse 80, height 5\' 6"  (1.676 m), weight 229 lb (103.9 kg).  Physical Exam  General: The patient is alert and cooperative at the time of the examination.  The patient is markedly obese.  Eyes: Pupils are equal, round, and reactive to light. Discs are flat bilaterally.  Neck: The neck is supple, no carotid bruits are noted.  Respiratory: The respiratory examination is clear.  Cardiovascular: The cardiovascular examination reveals a regular rate and rhythm, no obvious murmurs or rubs are noted.  Skin: Extremities are with 1+ edema below the knees bilaterally.  Neurologic Exam  Mental status: The patient is alert and oriented x 3 at the time of the examination. The patient has apparent normal recent and remote memory, with an apparently normal attention span and concentration ability.  Mini-Mental status examination done today shows a total score of 30/30.  Cranial nerves: Facial symmetry is present. There is good sensation of the face to pinprick and soft touch bilaterally. The strength of the facial muscles and the muscles to head turning and shoulder shrug are normal bilaterally. Speech is well enunciated, no aphasia or dysarthria is noted. Extraocular movements are full. Visual fields are full. The tongue is midline, and the patient has symmetric elevation of the soft palate. No obvious hearing deficits are noted.  Motor: The motor testing reveals 5 over 5 strength of all 4 extremities. Good symmetric motor tone is noted throughout.  Sensory: Sensory testing is intact to pinprick, soft touch, vibration sensation, and position sense on all 4 extremities. No evidence of extinction is noted.  Coordination: Cerebellar testing reveals good finger-nose-finger and heel-to-shin bilaterally.  Gait and station: Gait is slightly wide-based, the patient can walk independently.  Tandem gait is unsteady.   Romberg is negative.  Reflexes: Deep tendon reflexes are symmetric and normal bilaterally. Toes are downgoing bilaterally.   MRI brain 12/15/17:  IMPRESSION: Negative diffusion imaging. Specifically, no infarction in the left parietal region.  * MRI scan images were reviewed online. I agree with the written report.   CTA head and neck 12/15/17:  IMPRESSION: CTA neck:  1. Patent carotid and vertebral arteries. No dissection, aneurysm, or hemodynamically significant stenosis by NASCET criteria. 2. 4 cm ascending aortic aneurysm. Recommend annual imaging followup by CTA or MRA.  3. Calcific atherosclerosis of bilateral carotid bifurcations with mild less than 50% proximal ICA stenosis bilaterally. 4. Advanced cervical spondylosis with multifactorial moderate to severe C5-6 spinal canal stenosis.  CTA head:  Patent anterior and posterior intracranial circulation. No large vessel occlusion, aneurysm, or significant stenosis.  CT brain perfusion:  Motion artifact resulting post processing misregistration. Small areas of perfusion anomaly may be obscured. No gross large vascular territory acute infarct by perfusion criteria.    Assessment/Plan:  1.  Viral meningitis, resolved  2.  History of sleep apnea on CPAP  3.  Vitamin B12 deficiency  The patient is on oral vitamin B12 supplementation, we will recheck a B12 level today.  The patient has resolved his mental status deficits following resolution of the viral meningitis.  No further work-up or follow-up is indicated.  Jill Alexanders MD 02/28/2018 3:00 PM  [facility] [address] [place], [place] [address]  Phone [phone] Fax [phone]

## 2018-03-01 LAB — VITAMIN B12: Vitamin B-12: 1002 pg/mL (ref 232–1245)

## 2018-03-21 ENCOUNTER — Encounter: Payer: Self-pay | Admitting: Cardiology

## 2018-03-21 ENCOUNTER — Ambulatory Visit: Payer: Medicare Other | Admitting: Cardiology

## 2018-03-21 VITALS — BP 134/84 | HR 77 | Ht 66.0 in | Wt 231.0 lb

## 2018-03-21 DIAGNOSIS — I10 Essential (primary) hypertension: Secondary | ICD-10-CM | POA: Diagnosis not present

## 2018-03-21 DIAGNOSIS — Z01810 Encounter for preprocedural cardiovascular examination: Secondary | ICD-10-CM

## 2018-03-21 DIAGNOSIS — I25119 Atherosclerotic heart disease of native coronary artery with unspecified angina pectoris: Secondary | ICD-10-CM | POA: Diagnosis not present

## 2018-03-21 DIAGNOSIS — E782 Mixed hyperlipidemia: Secondary | ICD-10-CM | POA: Diagnosis not present

## 2018-03-21 DIAGNOSIS — R011 Cardiac murmur, unspecified: Secondary | ICD-10-CM | POA: Diagnosis not present

## 2018-03-21 NOTE — Patient Instructions (Addendum)

## 2018-03-21 NOTE — Progress Notes (Signed)
Cardiology Office Note  Date: 03/21/2018   ID: DAMIN SALIDO, DOB 1940/08/10, MRN [id number]  PCP: Dettinger, Fransisca Kaufmann, MD  Primary Cardiologist: Rozann Lesches, MD   Chief Complaint  Patient presents with  . Coronary Artery Disease    History of Present Illness: ABDIKADIR FOHL is a 78 y.o. male last seen in September 2019.  He presents for a routine visit.  From a cardiac perspective he denies any angina symptoms or nitroglycerin use.  He has been limited by significant left hip pain and is actually scheduled to undergo left total hip arthroplasty with Dr. Wynelle Link in April.  I reviewed his medications.  Cardiac regimen includes aspirin, Norvasc, Lipitor, Cozaar, and as needed nitroglycerin.  Ischemic testing in 2017 was low risk.  He does describe activities at meet 4 METS.  RCRI risk calculator indicates class III, 6.6% risk of perioperative major cardiac event.  Past Medical History:  Diagnosis Date  . Anxiety disorder   . Aortic atherosclerosis ([place]) 12/19/2017  . Arthritis   . Bursitis of hip   . Cervical spondylosis   . Chronic headaches   . Coronary atherosclerosis of native coronary artery    BMS nondominant RCA 12/2004  . Essential hypertension   . GERD (gastroesophageal reflux disease)   . History of adenomatous polyp of colon   . History of kidney stones   . History of MI (myocardial infarction) 12/2004  . History of viral meningitis 02/28/2018   December 2019  . Hyperlipidemia   . Internal hemorrhoids   . Low back pain   . Nocturia more than twice per night   . Numbness of left foot   . OSA (obstructive sleep apnea)   . Spinal stenosis   . Type 2 diabetes mellitus treated with insulin ([place])   . Urgency of urination   . Wears dentures    upper    Past Surgical History:  Procedure Laterality Date  . CARDIAC CATHETERIZATION  06-14-2004  dr Lia Foyer   total occlusion mD1, RI 30-40%, mRCA nondominant hazy 80% and scattered 30-40%,  ef 71% (medical  mangement)  . CARDIOVASCULAR STRESS TEST  09-17-2015   dr Domenic Polite   Low risk nuclear study w/ reversible mild small anteroapical defect,  normal LV function and wall motion, nuclear stress ef 61%  . CATARACT EXTRACTION W/ INTRAOCULAR LENS IMPLANT Right 07/2014  . CHOLECYSTECTOMY    . COLONOSCOPY  09/10/2008   normal  . CORONARY ANGIOPLASTY WITH STENT PLACEMENT  12-17-2004   dr benismhon/ dr Olevia Perches   nonobstructive cad LAD and LCFx,  BMS x1 to total occlusion RCA   . CYSTOSCOPY W/ URETEROSCOPY W/ LITHOTRIPSY  08/2010  . ERCP  03/14/2008  . LAMINECTOMY    . LAPAROSCOPIC CHOLECYSTECTOMY  05/2012  . LEFT HEART CATHETERIZATION WITH CORONARY ANGIOGRAM N/A 05/14/2011   Procedure: LEFT HEART CATHETERIZATION WITH CORONARY ANGIOGRAM;  Surgeon: Sherren Mocha, MD;  Location: [facility] CATH LAB;  Service: Cardiovascular;  Laterality: N/A;    non-obstructive LM, LAD, LCFx, patent RCA stent  . LUMBAR LAMINECTOMY/DECOMPRESSION MICRODISCECTOMY N/A 02/24/2017   Procedure: Microlumbar decompression L4-5, L5-S1;  Surgeon: Susa Day, MD;  Location: [facility] ORS;  Service: Orthopedics;  Laterality: N/A;  120 mins  . ROTATOR CUFF REPAIR Right 03/2002  . ROTATOR CUFF REPAIR Left 12/11/2015  . TOTAL HIP ARTHROPLASTY Right 12/20/2008  . TOTAL KNEE ARTHROPLASTY Bilateral left 12-11-2003/  right 07-31-2004   dr Wynelle Link [facility]  . UPPER GASTROINTESTINAL ENDOSCOPY  01/08/2008   bx, inlet  patch, duodenitis  . YAG LASER APPLICATION Right 07/11/1599   Procedure: YAG LASER APPLICATION;  Surgeon: Williams Che, MD;  Location: [facility] ORS;  Service: Ophthalmology;  Laterality: Right;    Current Outpatient Medications  Medication Sig Dispense Refill  . acetaminophen (TYLENOL) 650 MG CR tablet Take 1,300 mg by mouth 2 (two) times daily as needed for pain.     Marland Kitchen amLODipine (NORVASC) 5 MG tablet Take 5 mg by mouth daily.    Marland Kitchen aspirin EC 81 MG tablet Take 81 mg by mouth every evening.    Marland Kitchen atorvastatin (LIPITOR) 20 MG tablet TAKE 1 TABLET BY  MOUTH  DAILY AT 6 PM. (Patient taking differently: Take 20 mg by mouth every evening. ) 90 tablet 3  . butalbital-acetaminophen-caffeine (FIORICET, ESGIC) 50-325-40 MG tablet Take 1 tablet by mouth 2 (two) times daily as needed for headache. 14 tablet 0  . cetirizine (ZYRTEC) 10 MG tablet Take 10 mg by mouth every evening.     . Cholecalciferol (VITAMIN D3) 2000 UNITS TABS Take 2,000 Units by mouth every morning.     . famotidine (PEPCID) 20 MG tablet TAKE 1 TABLET BY MOUTH TWO  TIMES DAILY (Patient taking differently: Take 20 mg by mouth 2 (two) times daily. ) 180 tablet 3  . HUMALOG KWIKPEN 100 UNIT/ML KwikPen INJECT 0 TO 15 UNITS  SUBCUTANEOUSLY 3 TIMES  DAILY BEFORE MEALS  (70-150=8 UNITS, 151-200=9  UNITS) (Patient taking differently: Inject 0-10 Units into the skin 3 (three) times daily before meals. ) 45 mL 2  . LANTUS SOLOSTAR 100 UNIT/ML Solostar Pen INJECT SUBCUTANEOUSLY 52  UNITS AT BEDTIME (Patient taking differently: Inject 52 Units into the skin at bedtime. ) 60 mL 2  . losartan (COZAAR) 50 MG tablet TAKE 1 TABLET BY MOUTH  DAILY 90 tablet 3  . Menthol, Topical Analgesic, (BLUE-EMU MAXIMUM STRENGTH EX) Apply 1 application topically daily as needed (muscle pain).    . metFORMIN (GLUCOPHAGE-XR) 500 MG 24 hr tablet TAKE 2 TABLETS BY MOUTH TWO TIMES DAILY (Patient taking differently: Take 1,000 mg by mouth 2 (two) times daily. ) 360 tablet 3  . nitroGLYCERIN (NITROSTAT) 0.4 MG SL tablet DISSOLVE ONE TABLET UNDER THE TONGUE EVERY 5 MINUTES AS NEEDED FOR CHEST PAIN.  DO NOT EXCEED A TOTAL OF 3 DOSES IN 15 MINUTES (Patient taking differently: Place 0.4 mg under the tongue every 5 (five) minutes as needed. DISSOLVE ONE TABLET UNDER THE TONGUE EVERY 5 MINUTES AS NEEDED FOR CHEST PAIN.  DO NOT EXCEED A TOTAL OF 3 DOSES IN 15 MINUTES) 25 tablet 3  . ONE TOUCH ULTRA TEST test strip USE 1 STRIP TO CHECK GLUCOSE 4 TIMES DAILY 400 each 3  . PARoxetine (PAXIL) 20 MG tablet Take 0.5 tablets (10 mg total) by  mouth daily. (Patient taking differently: Take 20 mg by mouth daily. )    . polyethylene glycol (MIRALAX / GLYCOLAX) packet Take 17 g by mouth daily. (Patient taking differently: Take 17 g by mouth daily as needed for mild constipation. ) 14 each 0  . polyvinyl alcohol (LIQUIFILM TEARS) 1.4 % ophthalmic solution Place 1 drop into both eyes as needed for dry eyes. 15 mL 0  . tamsulosin (FLOMAX) 0.4 MG CAPS capsule TAKE 1 CAPSULE BY MOUTH  DAILY (Patient taking differently: Take 0.4 mg by mouth every evening. ) 90 capsule 3  . vitamin B-12 (CYANOCOBALAMIN) 1000 MCG tablet Take 2 tablets (2,000 mcg total) by mouth daily. 60 tablet 2   No  current facility-administered medications for this visit.    Allergies:  Ace inhibitors; Angiotensin receptor blockers; Ciprofloxacin; Other; Oxycodone-acetaminophen; Ramipril; Robaxin [methocarbamol]; Toradol [ketorolac tromethamine]; Valium; and Doxycycline   Social History: The patient  reports that he quit smoking about 29 years ago. His smoking use included cigarettes. He started smoking about 55 years ago. He has a 20.00 pack-year smoking history. He quit smokeless tobacco use about 29 years ago.  His smokeless tobacco use included chew. He reports that he does not drink alcohol or use drugs.   ROS:  Please see the history of present illness. Otherwise, complete review of systems is positive for none.  All other systems are reviewed and negative.   Physical Exam: VS:  BP 134/84   Pulse 77   Ht 5\' 6"  (1.676 m)   Wt 231 lb (104.8 kg)   SpO2 97%   BMI 37.28 kg/m , BMI Body mass index is 37.28 kg/m.  Wt Readings from Last 3 Encounters:  03/21/18 231 lb (104.8 kg)  02/28/18 229 lb (103.9 kg)  02/06/18 220 lb 9.6 oz (100.1 kg)    General: Elderly male, appears comfortable at rest. HEENT: Conjunctiva and lids normal, oropharynx clear. Neck: Supple, no elevated JVP or carotid bruits, no thyromegaly. Lungs: Clear to auscultation, nonlabored breathing at  rest. Cardiac: Regular rate and rhythm, no S3, 1-6/1 systolic murmur, no pericardial rub. Abdomen: Soft, nontender, bowel sounds present. Extremities: No pitting edema, distal pulses 2+. Skin: Warm and dry. Musculoskeletal: No kyphosis. Neuropsychiatric: Alert and oriented x3, affect grossly appropriate.  ECG: I personally reviewed the tracing from 12/18/2017 which showed normal sinus rhythm.  Recent Labwork: 12/15/2017: TSH 0.930 12/18/2017: Magnesium 1.9 12/30/2017: ALT 17; AST 16; BUN 18; Creatinine, Ser 0.95; Hemoglobin 14.6; Platelets 401; Potassium 4.4; Sodium 140     Component Value Date/Time   CHOL 128 12/15/2017 0552   CHOL 128 04/28/2017 0901   CHOL 139 06/02/2012 1007   TRIG 150 (H) 12/15/2017 0552   TRIG 185 (H) 04/10/2013 0937   TRIG 107 06/02/2012 1007   HDL 34 (L) 12/15/2017 0552   HDL 34 (L) 04/28/2017 0901   HDL 34 (L) 04/10/2013 0937   HDL 40 06/02/2012 1007   CHOLHDL 3.8 12/15/2017 0552   VLDL 30 12/15/2017 0552   LDLCALC 64 12/15/2017 0552   LDLCALC 68 04/28/2017 0901   LDLCALC 62 04/10/2013 0937   LDLCALC 78 06/02/2012 1007    Other Studies Reviewed Today:  Lexiscan Myoview 09/17/2015:  No diagnostic ST segment changes to indicate ischemia.  Small, mild intensity, reversible anteroapical defect consistent with a small region of ischemia.  This is a low risk study.  Nuclear stress EF: 61%.  Assessment and Plan:  1.  Preoperative evaluation in a 78 year old male with CAD status post BMS to nondominant RCA in 2006 in the setting of infarct.  He underwent low risk stress testing in 2017 and does not report any active angina on medical therapy at this time.  Describes activities meeting 4 METS.  RCRI risk calculator indicates class III, 6.6% risk of major adverse cardiac event, overall intermediate.  We will obtain an echocardiogram to follow-up on LVEF and cardiac murmur, but otherwise do not anticipate further testing at this time.  2.  Mixed  hyperlipidemia, on Lipitor.  Last LDL 78.  3.  Essential hypertension, blood pressure is adequately controlled.  He continues to follow with PCP.  4.  Type 2 diabetes mellitus, on insulin and Glucophage.  Keep follow-up with PCP.  Current medicines were reviewed with the patient today.   Orders Placed This Encounter  Procedures  . ECHOCARDIOGRAM COMPLETE    Disposition: Follow-up in 6 months.  Signed, Satira Sark, MD, [facility] 03/21/2018 11:48 AM    [facility] [place], [place], [place] [address] Phone: [phone]; Fax: [phone]

## 2018-03-22 DIAGNOSIS — G44009 Cluster headache syndrome, unspecified, not intractable: Secondary | ICD-10-CM | POA: Diagnosis not present

## 2018-03-22 DIAGNOSIS — G4733 Obstructive sleep apnea (adult) (pediatric): Secondary | ICD-10-CM | POA: Diagnosis not present

## 2018-04-05 DIAGNOSIS — G4733 Obstructive sleep apnea (adult) (pediatric): Secondary | ICD-10-CM | POA: Diagnosis not present

## 2018-04-10 DIAGNOSIS — M25552 Pain in left hip: Secondary | ICD-10-CM | POA: Diagnosis not present

## 2018-04-12 ENCOUNTER — Other Ambulatory Visit: Payer: Self-pay

## 2018-04-22 DIAGNOSIS — G4733 Obstructive sleep apnea (adult) (pediatric): Secondary | ICD-10-CM | POA: Diagnosis not present

## 2018-04-22 DIAGNOSIS — G44009 Cluster headache syndrome, unspecified, not intractable: Secondary | ICD-10-CM | POA: Diagnosis not present

## 2018-05-03 ENCOUNTER — Encounter (HOSPITAL_COMMUNITY): Payer: Self-pay

## 2018-05-03 ENCOUNTER — Inpatient Hospital Stay (HOSPITAL_COMMUNITY): Admit: 2018-05-03 | Payer: Medicare Other | Admitting: Orthopedic Surgery

## 2018-05-03 SURGERY — ARTHROPLASTY, HIP, TOTAL, ANTERIOR APPROACH
Anesthesia: Choice | Laterality: Left

## 2018-05-15 ENCOUNTER — Encounter: Payer: Self-pay | Admitting: Family Medicine

## 2018-05-15 ENCOUNTER — Ambulatory Visit (INDEPENDENT_AMBULATORY_CARE_PROVIDER_SITE_OTHER): Payer: Medicare Other | Admitting: Family Medicine

## 2018-05-15 ENCOUNTER — Other Ambulatory Visit: Payer: Self-pay

## 2018-05-15 VITALS — BP 151/84 | HR 77 | Temp 97.4°F | Ht 66.0 in | Wt 225.8 lb

## 2018-05-15 DIAGNOSIS — E1129 Type 2 diabetes mellitus with other diabetic kidney complication: Secondary | ICD-10-CM

## 2018-05-15 DIAGNOSIS — I10 Essential (primary) hypertension: Secondary | ICD-10-CM | POA: Diagnosis not present

## 2018-05-15 DIAGNOSIS — R809 Proteinuria, unspecified: Secondary | ICD-10-CM | POA: Diagnosis not present

## 2018-05-15 DIAGNOSIS — I152 Hypertension secondary to endocrine disorders: Secondary | ICD-10-CM

## 2018-05-15 DIAGNOSIS — E1159 Type 2 diabetes mellitus with other circulatory complications: Secondary | ICD-10-CM

## 2018-05-15 DIAGNOSIS — M1612 Unilateral primary osteoarthritis, left hip: Secondary | ICD-10-CM | POA: Diagnosis not present

## 2018-05-15 DIAGNOSIS — E1169 Type 2 diabetes mellitus with other specified complication: Secondary | ICD-10-CM

## 2018-05-15 DIAGNOSIS — Z794 Long term (current) use of insulin: Secondary | ICD-10-CM | POA: Diagnosis not present

## 2018-05-15 DIAGNOSIS — R350 Frequency of micturition: Secondary | ICD-10-CM

## 2018-05-15 DIAGNOSIS — E785 Hyperlipidemia, unspecified: Secondary | ICD-10-CM

## 2018-05-15 LAB — BAYER DCA HB A1C WAIVED: HB A1C (BAYER DCA - WAIVED): 7.1 % — ABNORMAL HIGH (ref <7.0)

## 2018-05-15 NOTE — Progress Notes (Signed)
BP (!) 151/84   Pulse 77   Temp (!) 97.4 F (36.3 C) (Oral)   Ht '5\' 6"'  (1.676 m)   Wt 225 lb 12.8 oz (102.4 kg)   BMI 36.45 kg/m    Subjective:   Patient ID: [name], male    DOB: 1940-07-16, 78 y.o.   MRN: [id number]  HPI: [name] is a 78 y.o. male presenting on 05/15/2018 for Diabetes (3 month follow up) and Hip Pain (left- Patient states it has been on going but has gotten worse last few months )   HPI Type 2 diabetes mellitus Patient comes in today for recheck of his diabetes. Patient has been currently taking Lantus 52 nightly and metformin and Humalog 10 to 12 units 3 times daily with meals. Patient is currently on an ACE inhibitor/ARB. Patient has not seen an ophthalmologist this year. Patient denies any new issues with their feet.  Patient has known micro albuminuria and is currently on losartan.  Hypertension Patient is currently on losartan, and their blood pressure today is 151/84. Patient denies any lightheadedness or dizziness. Patient denies headaches, blurred vision, chest pains, shortness of breath, or weakness. Denies any side effects from medication and is content with current medication.  Patient will get some swelling in his legs but then it goes down at night and comes and goes.  Hyperlipidemia Patient is coming in for recheck of his hyperlipidemia. The patient is currently taking Lipitor. They deny any issues with myalgias or history of liver damage from it. They deny any focal numbness or weakness or chest pain.   Left hip pain osteoarthritis Patient has been having significant left hip pain and osteoarthritis, he supposed to get a hip replacement but due to the coronavirus is been delayed in his hip is been bothering him significantly.  We will put in the contact to Dr. Mardene Speak office and see if there is any possibility that he could get this rescheduled sooner.  Relevant past medical, surgical, family and social history reviewed and updated  as indicated. Interim medical history since our last visit reviewed. Allergies and medications reviewed and updated.  Review of Systems  Constitutional: Negative for chills and fever.  Respiratory: Negative for shortness of breath and wheezing.   Cardiovascular: Negative for chest pain and leg swelling.  Musculoskeletal: Positive for arthralgias, back pain, gait problem and joint swelling.  Skin: Negative for rash.  Neurological: Negative for dizziness, weakness, light-headedness and headaches.  All other systems reviewed and are negative.   Per HPI unless specifically indicated above   Allergies as of 05/15/2018      Reactions   Ace Inhibitors Hives, Swelling, Rash   Rash,hives,tongue swelling   Angiotensin Receptor Blockers    Unknown reaction    Ciprofloxacin Other (See Comments)   confusion   Other Hives, Other (See Comments)   Altaseptic, altase (cough)   Oxycodone-acetaminophen Other (See Comments)   REACTION: unknown reaction   Ramipril Cough   Robaxin [methocarbamol] Other (See Comments)   Confusion    Toradol [ketorolac Tromethamine] Other (See Comments)   confusion   Valium Other (See Comments)   Hallucinations; confusion   Doxycycline Hives, Rash      Medication List       Accurate as of May 15, 2018  8:45 AM. Always use your most recent med list.        acetaminophen 650 MG CR tablet Commonly known as:  TYLENOL Take 1,300 mg by mouth 2 (two) times  daily as needed for pain.   amLODipine 5 MG tablet Commonly known as:  NORVASC Take 5 mg by mouth daily.   aspirin EC 81 MG tablet Take 81 mg by mouth every evening.   atorvastatin 20 MG tablet Commonly known as:  LIPITOR TAKE 1 TABLET BY MOUTH  DAILY AT 6 PM.   BLUE-EMU MAXIMUM STRENGTH EX Apply 1 application topically daily as needed (muscle pain).   butalbital-acetaminophen-caffeine 50-325-40 MG tablet Commonly known as:  FIORICET Take 1 tablet by mouth 2 (two) times daily as needed for headache.    cetirizine 10 MG tablet Commonly known as:  ZYRTEC Take 10 mg by mouth every evening.   famotidine 20 MG tablet Commonly known as:  PEPCID TAKE 1 TABLET BY MOUTH TWO  TIMES DAILY   HumaLOG KwikPen 100 UNIT/ML KwikPen Generic drug:  insulin lispro INJECT 0 TO 15 UNITS  SUBCUTANEOUSLY 3 TIMES  DAILY BEFORE MEALS  (70-150=8 UNITS, 151-200=9  UNITS)   Lantus SoloStar 100 UNIT/ML Solostar Pen Generic drug:  Insulin Glargine INJECT SUBCUTANEOUSLY 52  UNITS AT BEDTIME   losartan 50 MG tablet Commonly known as:  COZAAR TAKE 1 TABLET BY MOUTH  DAILY   metFORMIN 500 MG 24 hr tablet Commonly known as:  GLUCOPHAGE-XR TAKE 2 TABLETS BY MOUTH TWO TIMES DAILY   nitroGLYCERIN 0.4 MG SL tablet Commonly known as:  NITROSTAT DISSOLVE ONE TABLET UNDER THE TONGUE EVERY 5 MINUTES AS NEEDED FOR CHEST PAIN.  DO NOT EXCEED A TOTAL OF 3 DOSES IN 15 MINUTES   ONE TOUCH ULTRA TEST test strip Generic drug:  glucose blood USE 1 STRIP TO CHECK GLUCOSE 4 TIMES DAILY   PARoxetine 20 MG tablet Commonly known as:  PAXIL Take 0.5 tablets (10 mg total) by mouth daily.   polyethylene glycol 17 g packet Commonly known as:  MIRALAX / GLYCOLAX Take 17 g by mouth daily.   polyvinyl alcohol 1.4 % ophthalmic solution Commonly known as:  LIQUIFILM TEARS Place 1 drop into both eyes as needed for dry eyes.   tamsulosin 0.4 MG Caps capsule Commonly known as:  FLOMAX TAKE 1 CAPSULE BY MOUTH  DAILY   vitamin B-12 1000 MCG tablet Commonly known as:  CYANOCOBALAMIN Take 2 tablets (2,000 mcg total) by mouth daily.   Vitamin D3 50 MCG (2000 UT) Tabs Take 2,000 Units by mouth every morning.        Objective:   BP (!) 151/84   Pulse 77   Temp (!) 97.4 F (36.3 C) (Oral)   Ht '5\' 6"'  (1.676 m)   Wt 225 lb 12.8 oz (102.4 kg)   BMI 36.45 kg/m   Wt Readings from Last 3 Encounters:  05/15/18 225 lb 12.8 oz (102.4 kg)  03/21/18 231 lb (104.8 kg)  02/28/18 229 lb (103.9 kg)    Physical Exam Vitals  signs and nursing note reviewed.  Constitutional:      General: He is not in acute distress.    Appearance: He is well-developed. He is not diaphoretic.  Eyes:     General: No scleral icterus.    Conjunctiva/sclera: Conjunctivae normal.  Neck:     Musculoskeletal: Neck supple.     Thyroid: No thyromegaly.  Cardiovascular:     Rate and Rhythm: Normal rate and regular rhythm.     Heart sounds: Murmur present. Crescendo  decrescendo  systolic murmur present with a grade of 3/6.  Pulmonary:     Effort: Pulmonary effort is normal. No respiratory distress.  Breath sounds: Normal breath sounds. No wheezing.  Musculoskeletal: Normal range of motion.     Left hip: He exhibits tenderness (With all range of motion). He exhibits normal range of motion and normal strength.  Lymphadenopathy:     Cervical: No cervical adenopathy.  Skin:    General: Skin is warm and dry.     Findings: No rash.  Neurological:     Mental Status: He is alert and oriented to person, place, and time.     Coordination: Coordination normal.  Psychiatric:        Behavior: Behavior normal.       Assessment & Plan:   Problem List Items Addressed This Visit      Cardiovascular and Mediastinum   Hypertension associated with diabetes ([place])   Relevant Orders   CMP14+EGFR   Type 2 diabetes mellitus with circulatory disorder ([place]) - Primary   Relevant Orders   Bayer DCA Hb A1c Waived   CBC with Differential/Platelet     Endocrine   Hyperlipidemia associated with type 2 diabetes mellitus ([place])   Relevant Orders   Lipid panel   Microalbuminuria due to type 2 diabetes mellitus ([place])   Relevant Orders   CBC with Differential/Platelet    Other Visit Diagnoses    Primary osteoarthritis of left hip       Patient is slated for replacement but has been delayed due to coronavirus      Patient has a known murmur and sees cardiologist for this, will continue medication for diabetes and blood pressure Follow up  plan: Return if symptoms worsen or fail to improve, for Diabetes and hypertension.  Counseling provided for all of the vaccine components Orders Placed This Encounter  Procedures  . Bayer DCA Hb A1c Waived  . CBC with Differential/Platelet  . CMP14+EGFR  . Lipid panel    Caryl Pina, MD [facility] 05/15/2018, 8:45 AM

## 2018-05-16 LAB — LIPID PANEL
Chol/HDL Ratio: 3.6 ratio (ref 0.0–5.0)
Cholesterol, Total: 126 mg/dL (ref 100–199)
HDL: 35 mg/dL — ABNORMAL LOW (ref >39)
LDL Calculated: 61 mg/dL (ref 0–99)
Triglycerides: 148 mg/dL (ref 0–149)
VLDL Cholesterol Cal: 30 mg/dL (ref 5–40)

## 2018-05-16 LAB — CMP14+EGFR
ALT: 21 IU/L (ref 0–44)
AST: 18 IU/L (ref 0–40)
Albumin/Globulin Ratio: 1.4 (ref 1.2–2.2)
Albumin: 4.1 g/dL (ref 3.7–4.7)
Alkaline Phosphatase: 75 IU/L (ref 39–117)
BUN/Creatinine Ratio: 15 (ref 10–24)
BUN: 13 mg/dL (ref 8–27)
Bilirubin Total: 0.4 mg/dL (ref 0.0–1.2)
CO2: 24 mmol/L (ref 20–29)
Calcium: 9.6 mg/dL (ref 8.6–10.2)
Chloride: 101 mmol/L (ref 96–106)
Creatinine, Ser: 0.89 mg/dL (ref 0.76–1.27)
GFR calc Af Amer: 95 mL/min/{1.73_m2} (ref >59)
GFR calc non Af Amer: 82 mL/min/{1.73_m2} (ref >59)
Globulin, Total: 3 g/dL (ref 1.5–4.5)
Glucose: 145 mg/dL — ABNORMAL HIGH (ref 65–99)
Potassium: 4.6 mmol/L (ref 3.5–5.2)
Sodium: 141 mmol/L (ref 134–144)
Total Protein: 7.1 g/dL (ref 6.0–8.5)

## 2018-05-16 LAB — CBC WITH DIFFERENTIAL/PLATELET
Basophils Absolute: 0.1 10*3/uL (ref 0.0–0.2)
Basos: 1 %
EOS (ABSOLUTE): 0.1 10*3/uL (ref 0.0–0.4)
Eos: 2 %
Hematocrit: 44 % (ref 37.5–51.0)
Hemoglobin: 14.6 g/dL (ref 13.0–17.7)
Immature Grans (Abs): 0 10*3/uL (ref 0.0–0.1)
Immature Granulocytes: 0 %
Lymphocytes Absolute: 1.8 10*3/uL (ref 0.7–3.1)
Lymphs: 25 %
MCH: 28.8 pg (ref 26.6–33.0)
MCHC: 33.2 g/dL (ref 31.5–35.7)
MCV: 87 fL (ref 79–97)
Monocytes Absolute: 0.6 10*3/uL (ref 0.1–0.9)
Monocytes: 9 %
Neutrophils Absolute: 4.3 10*3/uL (ref 1.4–7.0)
Neutrophils: 63 %
Platelets: 292 10*3/uL (ref 150–450)
RBC: 5.07 x10E6/uL (ref 4.14–5.80)
RDW: 13.6 % (ref 11.6–15.4)
WBC: 6.9 10*3/uL (ref 3.4–10.8)

## 2018-05-22 DIAGNOSIS — G44009 Cluster headache syndrome, unspecified, not intractable: Secondary | ICD-10-CM | POA: Diagnosis not present

## 2018-05-22 DIAGNOSIS — G4733 Obstructive sleep apnea (adult) (pediatric): Secondary | ICD-10-CM | POA: Diagnosis not present

## 2018-05-29 ENCOUNTER — Encounter: Payer: Medicare Other | Admitting: *Deleted

## 2018-06-02 ENCOUNTER — Ambulatory Visit (INDEPENDENT_AMBULATORY_CARE_PROVIDER_SITE_OTHER): Payer: Medicare Other | Admitting: *Deleted

## 2018-06-02 ENCOUNTER — Other Ambulatory Visit: Payer: Self-pay

## 2018-06-02 ENCOUNTER — Encounter: Payer: Self-pay | Admitting: *Deleted

## 2018-06-02 DIAGNOSIS — Z0000 Encounter for general adult medical examination without abnormal findings: Secondary | ICD-10-CM | POA: Diagnosis not present

## 2018-06-02 NOTE — Progress Notes (Signed)
MEDICARE ANNUAL WELLNESS VISIT  06/02/2018  Telephone Visit Disclaimer This Medicare AWV was conducted by telephone due to national recommendations for restrictions regarding the COVID-19 Pandemic (e.g. social distancing).  I verified, using two identifiers, that I am speaking with [name] or their authorized healthcare agent. I discussed the limitations, risks, security, and privacy concerns of performing an evaluation and management service by telephone and the potential availability of an in-person appointment in the future. The patient expressed understanding and agreed to proceed.   Subjective:  [name] is a 78 y.o. male patient of [name], [name] Kaufmann, MD who had a Medicare Annual Wellness Visit today via telephone. [name] is retired and lives with their spouse. He has 1 son, 2 grandchildren, and 2 great grandchildren.  He reports that he is socially active and does interact with friends/family regularly. He is minimally physically active due to significant left hip pain.  He was scheduled to have hip replacement surgery in April 2020, but was rescheduled to 07/05/2018 due to COVID 19.  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  Patient Care Team: [name], [name] Kaufmann, MD as PCP - General (Family Medicine) Satira Sark, MD as PCP - Cardiology (Cardiology) Satira Sark, MD as Consulting Physician (Cardiology) Sydnee Cabal, MD as Consulting Physician (Orthopedic Surgery) Gaynelle Arabian, MD as Consulting Physician (Orthopedic Surgery)  Advanced Directives 06/02/2018 12/19/2017 12/18/2017 12/15/2017 07/01/2017 05/23/2017 02/24/2017  Does Patient Have a Medical Advance Directive? Yes No Yes Yes No Yes Yes  Type of Paramedic of [place];Living will - Living will Living will - Living will;Healthcare Power of Attorney -  Does patient want to make changes to medical advance directive? - No - Patient declined - No - Patient declined -  No - Patient declined -  Copy of [place] in Chart? No - copy requested - - - - Yes -  Would patient like information on creating a medical advance directive? - No - Patient declined - - - - -  Pre-existing out of facility DNR order (yellow form or pink MOST form) - - - - - - -    Hospital Utilization Over the Past 12 Months: # of hospitalizations or ER visits: 3 # of surgeries: 0  Review of Systems    Patient reports that his overall health is unchanged compared to last year.   Review of Systems:  Musculoskeletal - positive for left hip pain  All other systems negative.  Pain Assessment Pain Score: 10-Worst pain ever - left hip     Current Medications & Allergies (verified) Allergies as of 06/02/2018      Reactions   Ace Inhibitors Hives, Swelling, Rash   Rash,hives,tongue swelling   Angiotensin Receptor Blockers    Unknown reaction    Ciprofloxacin Other (See Comments)   confusion   Other Hives, Other (See Comments)   Altaseptic, altase (cough)   Oxycodone-acetaminophen Other (See Comments)   REACTION: unknown reaction   Ramipril Cough   Robaxin [methocarbamol] Other (See Comments)   Confusion    Toradol [ketorolac Tromethamine] Other (See Comments)   confusion   Valium Other (See Comments)   Hallucinations; confusion   Doxycycline Hives, Rash      Medication List       Accurate as of Jun 02, 2018  9:20 AM. If you have any questions, ask your nurse or doctor.        acetaminophen 650 MG CR tablet Commonly  known as:  TYLENOL Take 1,300 mg by mouth 2 (two) times daily as needed for pain.   amLODipine 5 MG tablet Commonly known as:  NORVASC Take 5 mg by mouth daily.   aspirin EC 81 MG tablet Take 81 mg by mouth every evening.   atorvastatin 20 MG tablet Commonly known as:  LIPITOR TAKE 1 TABLET BY MOUTH  DAILY AT 6 PM. What changed:  See the new instructions.   BLUE-EMU MAXIMUM STRENGTH EX Apply 1 application topically daily as  needed (muscle pain).   butalbital-acetaminophen-caffeine 50-325-40 MG tablet Commonly known as:  FIORICET Take 1 tablet by mouth 2 (two) times daily as needed for headache.   cetirizine 10 MG tablet Commonly known as:  ZYRTEC Take 10 mg by mouth every evening.   famotidine 20 MG tablet Commonly known as:  PEPCID TAKE 1 TABLET BY MOUTH TWO  TIMES DAILY   HumaLOG KwikPen 100 UNIT/ML KwikPen Generic drug:  insulin lispro INJECT 0 TO 15 UNITS  SUBCUTANEOUSLY 3 TIMES  DAILY BEFORE MEALS  (70-150=8 UNITS, 151-200=9  UNITS) What changed:  See the new instructions.   Lantus SoloStar 100 UNIT/ML Solostar Pen Generic drug:  Insulin Glargine INJECT SUBCUTANEOUSLY 52  UNITS AT BEDTIME What changed:  See the new instructions.   losartan 50 MG tablet Commonly known as:  COZAAR TAKE 1 TABLET BY MOUTH  DAILY   metFORMIN 500 MG 24 hr tablet Commonly known as:  GLUCOPHAGE-XR TAKE 2 TABLETS BY MOUTH TWO TIMES DAILY What changed:  See the new instructions.   nitroGLYCERIN 0.4 MG SL tablet Commonly known as:  NITROSTAT DISSOLVE ONE TABLET UNDER THE TONGUE EVERY 5 MINUTES AS NEEDED FOR CHEST PAIN.  DO NOT EXCEED A TOTAL OF 3 DOSES IN 15 MINUTES What changed:    how much to take  how to take this  when to take this  reasons to take this   ONE TOUCH ULTRA TEST test strip Generic drug:  glucose blood USE 1 STRIP TO CHECK GLUCOSE 4 TIMES DAILY   PARoxetine 20 MG tablet Commonly known as:  PAXIL Take 0.5 tablets (10 mg total) by mouth daily. What changed:  how much to take   polyethylene glycol 17 g packet Commonly known as:  MIRALAX / GLYCOLAX Take 17 g by mouth daily. What changed:    when to take this  reasons to take this   polyvinyl alcohol 1.4 % ophthalmic solution Commonly known as:  LIQUIFILM TEARS Place 1 drop into both eyes as needed for dry eyes.   tamsulosin 0.4 MG Caps capsule Commonly known as:  FLOMAX TAKE 1 CAPSULE BY MOUTH  DAILY What changed:  when to  take this   vitamin B-12 1000 MCG tablet Commonly known as:  CYANOCOBALAMIN Take 2 tablets (2,000 mcg total) by mouth daily.   Vitamin D3 50 MCG (2000 UT) Tabs Take 2,000 Units by mouth every morning.       History (reviewed): Past Medical History:  Diagnosis Date  . Anxiety disorder   . Aortic atherosclerosis ([place]) 12/19/2017  . Arthritis   . Bursitis of hip   . Cervical spondylosis   . Chronic headaches   . Coronary atherosclerosis of native coronary artery    BMS nondominant RCA 12/2004  . Essential hypertension   . GERD (gastroesophageal reflux disease)   . History of adenomatous polyp of colon   . History of kidney stones   . History of MI (myocardial infarction) 12/2004  . History of viral  meningitis 02/28/2018   December 2019  . Hyperlipidemia   . Internal hemorrhoids   . Low back pain   . Nocturia more than twice per night   . Numbness of left foot   . OSA (obstructive sleep apnea)   . Spinal stenosis   . Type 2 diabetes mellitus treated with insulin ([place])   . Urgency of urination   . Wears dentures    upper   Past Surgical History:  Procedure Laterality Date  . CARDIAC CATHETERIZATION  06-14-2004  dr Lia Foyer   total occlusion mD1, RI 30-40%, mRCA nondominant hazy 80% and scattered 30-40%,  ef 71% (medical mangement)  . CARDIOVASCULAR STRESS TEST  09-17-2015   dr Domenic Polite   Low risk nuclear study w/ reversible mild small anteroapical defect,  normal LV function and wall motion, nuclear stress ef 61%  . CATARACT EXTRACTION W/ INTRAOCULAR LENS IMPLANT Right 07/2014  . CHOLECYSTECTOMY    . COLONOSCOPY  09/10/2008   normal  . CORONARY ANGIOPLASTY WITH STENT PLACEMENT  12-17-2004   dr benismhon/ dr Olevia Perches   nonobstructive cad LAD and LCFx,  BMS x1 to total occlusion RCA   . CYSTOSCOPY W/ URETEROSCOPY W/ LITHOTRIPSY  08/2010  . ERCP  03/14/2008  . LAMINECTOMY    . LAPAROSCOPIC CHOLECYSTECTOMY  05/2012  . LEFT HEART CATHETERIZATION WITH CORONARY ANGIOGRAM N/A  05/14/2011   Procedure: LEFT HEART CATHETERIZATION WITH CORONARY ANGIOGRAM;  Surgeon: Sherren Mocha, MD;  Location: [facility] CATH LAB;  Service: Cardiovascular;  Laterality: N/A;    non-obstructive LM, LAD, LCFx, patent RCA stent  . LUMBAR LAMINECTOMY/DECOMPRESSION MICRODISCECTOMY N/A 02/24/2017   Procedure: Microlumbar decompression L4-5, L5-S1;  Surgeon: Susa Day, MD;  Location: [facility] ORS;  Service: Orthopedics;  Laterality: N/A;  120 mins  . ROTATOR CUFF REPAIR Right 03/2002  . ROTATOR CUFF REPAIR Left 12/11/2015  . TOTAL HIP ARTHROPLASTY Right 12/20/2008  . TOTAL KNEE ARTHROPLASTY Bilateral left 12-11-2003/  right 07-31-2004   dr Wynelle [facility]  . UPPER GASTROINTESTINAL ENDOSCOPY  01/08/2008   bx, inlet patch, duodenitis  . YAG LASER APPLICATION Right 4/62/7035   Procedure: YAG LASER APPLICATION;  Surgeon: Williams Che, MD;  Location: [facility] ORS;  Service: Ophthalmology;  Laterality: Right;   Family History  Problem Relation Age of Onset  . Colon cancer Mother        Diagnosed age 21  . Cancer Mother 34  . Heart disease Father   . Heart attack Father   . Breast cancer Sister   . Heart disease Brother   . Brain cancer Sister   . Heart disease Brother   . Bladder Cancer Brother   . Cancer - Other Brother    Social History   Socioeconomic History  . Marital status: Married    Spouse name: Diane   . Number of children: 1  . Years of education: HS  . Highest education level: High school graduate  Occupational History  . Occupation: RETIRED    Employer: [employer]    Comment: Power plant  Social Needs  . Financial resource strain: Not hard at all  . Food insecurity:    Worry: Never true    Inability: Never true  . Transportation needs:    Medical: No    Non-medical: No  Tobacco Use  . Smoking status: Former Smoker    Packs/day: 1.00    Years: 20.00    Pack years: 20.00    Types: Cigarettes    Start date: 01/18/1963    Last  attempt to quit: 08/11/1988    Years since  quitting: 29.8  . Smokeless tobacco: Former Systems developer    Types: Chew    Quit date: 01/11/1989  . Tobacco comment: chewed 1 pack tobacco/day for 15 years  Substance and Sexual Activity  . Alcohol use: No    Alcohol/week: 0.0 standard drinks    Frequency: Never  . Drug use: No  . Sexual activity: Yes  Lifestyle  . Physical activity:    Days per week: 0 days    Minutes per session: 0 min  . Stress: Only a little  Relationships  . Social connections:    Talks on phone: More than three times a week    Gets together: More than three times a week    Attends religious service: 1 to 4 times per year    Active member of club or organization: No    Attends meetings of clubs or organizations: Never    Relationship status: Married  Other Topics Concern  . Not on file  Social History Narrative   No regular exercise      Daily caffeine use: 3 cups   Patient is right handed.    Lives at home with spouse       Previous exposure to asbestos when working at [employer] between [phone]    Activities of Daily Living In your present state of health, do you have any difficulty performing the following activities: 06/02/2018 12/19/2017  Hearing? Y N  Comment deaf in right ear per patient, hearing aid not helpful -  Vision? N N  Difficulty concentrating or making decisions? Y Y  Comment Trouble concentrating -  Walking or climbing stairs? Y Y  Comment due to left hip -  Dressing or bathing? Y Y  Comment Due to left hip -  Doing errands, shopping? N N  Preparing Food and eating ? N -  Using the Toilet? N -  In the past six months, have you accidently leaked urine? Y -  Comment Urine leaking- has appointment with urology -  Do you have problems with loss of bowel control? N -  Managing your Medications? Y -  Comment Wife manages -  Managing your Finances? N -  Housekeeping or managing your Housekeeping? Y -  Comment Has help due to left hip pain -  Some recent data might be hidden     Patient Literacy    Exercise Current Exercise Habits: The patient does not participate in regular exercise at present, Exercise limited by: orthopedic condition(s)  Diet Patient reports consuming 3 meals a day and 2 snack(s) a day Patient reports that his primary diet is: Regular, Diabetic Patient reports that she does have regular access to food.   Depression Screen PHQ 2/9 Scores 06/02/2018 02/06/2018 12/30/2017 10/31/2017 07/29/2017 06/27/2017 05/23/2017  PHQ - 2 Score 1 1 5 1 1 1  0  PHQ- 9 Score - - 15 - - - -     Fall Risk Fall Risk  06/02/2018 02/06/2018 12/30/2017 10/31/2017 07/29/2017  Falls in the past year? 1 0 0 Yes No  Number falls in past yr: 1 - - 1 -  Injury with Fall? 0 - - No -  Comment - - - - -  Risk for fall due to : Impaired balance/gait;Impaired mobility - - - -  Follow up Education provided;Falls prevention discussed - - - -     Objective:  [name] seemed alert and oriented and he participated appropriately during  our telephone visit.  Blood Pressure Weight BMI  BP Readings from Last 3 Encounters:  05/15/18 (!) 151/84  03/21/18 134/84  02/28/18 132/74   Wt Readings from Last 3 Encounters:  05/15/18 225 lb 12.8 oz (102.4 kg)  03/21/18 231 lb (104.8 kg)  02/28/18 229 lb (103.9 kg)   BMI Readings from Last 1 Encounters:  05/15/18 36.45 kg/m    *Unable to obtain current vital signs, weight, and BMI due to telephone visit type  Hearing/Vision  . [name] did not seem to have difficulty with hearing/understanding during the telephone conversation . Reports that he has had a formal eye exam by an eye care professional within the past year . Reports that he has not had a formal hearing evaluation within the past year *Unable to fully assess hearing and vision during telephone visit type  Cognitive Function: 6CIT Screen 06/02/2018  What Year? 0 points  What month? 0 points  What time? 0 points  Count back from 20 0 points  Months in reverse 0  points  Repeat phrase 4 points  Total Score 4    Normal Cognitive Function Screening: Yes (Normal:0-7, Significant for Dysfunction: >8)  Immunization & Health Maintenance Record Immunization History  Administered Date(s) Administered  . Influenza, High Dose Seasonal PF 10/29/2015, 10/31/2017  . Influenza,inj,Quad PF,6+ Mos 11/08/2013, 10/26/2016  . Influenza-Unspecified 11/18/2014  . Pneumococcal Conjugate-13 01/01/2013  . Pneumococcal Polysaccharide-23 10/26/2016  . Tdap 01/01/2013  . Zoster Recombinat (Shingrix) 05/14/2016, 01/27/2017    Health Maintenance  Topic Date Due  . OPHTHALMOLOGY EXAM  08/23/2017  . FOOT EXAM  01/27/2018  . INFLUENZA VACCINE  08/12/2018  . HEMOGLOBIN A1C  11/15/2018  . TETANUS/TDAP  01/02/2023  . PNA vac Low Risk Adult  Completed       Assessment  This is a routine wellness examination for [employer].  Health Maintenance: Due or Overdue Health Maintenance Due  Topic Date Due  . OPHTHALMOLOGY EXAM  08/23/2017  . FOOT EXAM  01/27/2018   Patient states he has an eye exam scheduled for 06/14/2018.   Foot exam recommended at next visit with Dr. Ardis Hughs does not need a referral for Community Assistance: Care Management:   no Social Work:    no Prescription Assistance:  no Nutrition/Diabetes Education:  no   Plan:  Personalized Goals Goals Addressed            This Visit's Progress   . Exercise 150 min/wk Moderate Activity- patient plans to work on this after recovering from left hip replacement 07/05/2018   Not on track   . Weight (lb) < 200 lb (90.7 kg) (pt-stated)        Personalized Health Maintenance & Screening Recommendations  Advanced directives: has an advanced directive - a copy HAS NOT been provided.  Lung Cancer Screening Recommended: no (Low Dose CT Chest recommended if Age 8-80 years, 30 pack-year currently smoking OR have quit w/in past 15 years) Hepatitis C Screening recommended: not  applicable  Advanced Directives: Written information was not prepared per patient's request.  Referrals & Orders N/A  Follow-up Plan . Follow-up with [name], [name] Kaufmann, MD as planned . Keep appointments with specialist doctors  . Work on your goal of losing 20 lb  . At your convenience, please bring a copy of your Advance Directives (Healthcare Power of Attorney and Living Will) to our office to be filed in your medical record.   I have personally reviewed and noted  the following in the patient's chart:   . Medical and social history . Use of alcohol, tobacco or illicit drugs  . Current medications and supplements . Functional ability and status . Nutritional status . Physical activity . Advanced directives . List of other physicians . Hospitalizations, surgeries, and ER visits in previous 12 months . Vitals . Screenings to include cognitive, depression, and falls . Referrals and appointments  In addition, I have reviewed and discussed with [name] certain preventive protocols, quality metrics, and best practice recommendations. A written personalized care plan for preventive services as well as general preventive health recommendations is available and can be mailed to the patient at his request.      Nolberto Hanlon, RN 06/02/2018

## 2018-06-02 NOTE — Patient Instructions (Signed)
  [name] , Thank you for taking time to talk with me for your Medicare Wellness Visit. I appreciate your ongoing commitment to your health goals. Please review the following plan we discussed and let me know if I can assist you in the future.   These are the goals we discussed: Goals    .     .     . Weight (lb) < 200 lb (90.7 kg) (pt-stated)       This is a list of the screening recommended for you and due dates:  Health Maintenance  Topic Date Due  . Eye exam for diabetics  08/23/2017  . Complete foot exam   01/27/2018  . Flu Shot  08/12/2018  . Hemoglobin A1C  11/15/2018  . Tetanus Vaccine  01/02/2023  . Pneumonia vaccines  Completed

## 2018-06-03 ENCOUNTER — Other Ambulatory Visit: Payer: Self-pay | Admitting: Family Medicine

## 2018-06-14 DIAGNOSIS — H02831 Dermatochalasis of right upper eyelid: Secondary | ICD-10-CM | POA: Diagnosis not present

## 2018-06-14 DIAGNOSIS — E119 Type 2 diabetes mellitus without complications: Secondary | ICD-10-CM | POA: Diagnosis not present

## 2018-06-14 DIAGNOSIS — Z961 Presence of intraocular lens: Secondary | ICD-10-CM | POA: Diagnosis not present

## 2018-06-14 DIAGNOSIS — H02834 Dermatochalasis of left upper eyelid: Secondary | ICD-10-CM | POA: Diagnosis not present

## 2018-06-14 DIAGNOSIS — H2512 Age-related nuclear cataract, left eye: Secondary | ICD-10-CM | POA: Diagnosis not present

## 2018-06-14 LAB — HM DIABETES EYE EXAM

## 2018-06-16 NOTE — H&P (Addendum)
TOTAL HIP ADMISSION H&P  Patient is admitted for left total hip arthroplasty.  Subjective:  Chief Complaint: left hip pain  HPI: [name], 78 y.o. male, has a history of pain and functional disability in the left hip(s) due to arthritis and patient has failed non-surgical conservative treatments for greater than 12 weeks to include corticosteriod injections and activity modification.  Onset of symptoms was gradual starting 2 years ago with gradually worsening course since that time.The patient noted no past surgery on the left hip(s).  Patient currently rates pain in the left hip at 9 out of 10 with activity. Patient has pain that interfers with activities of daily living and crepitus. Patient has evidence of joint space narrowing by imaging studies. This condition presents safety issues increasing the risk of falls. There is no current active infection.  Patient Active Problem List   Diagnosis Date Noted  . History of viral meningitis 02/28/2018  . Aortic atherosclerosis ([place]) 12/19/2017  . Word finding difficulty 12/14/2017  . BPH (benign prostatic hyperplasia) 07/29/2017  . Lumbar spinal stenosis 02/25/2017  . Spinal stenosis of lumbar region 02/24/2017  . Spinal stenosis at L4-L5 level 02/24/2017  . Lumbar radicular pain 01/17/2017  . Lumbar pain 01/17/2017  . Microalbuminuria due to type 2 diabetes mellitus ([place]) 07/23/2016  . Common migraine 12/23/2014  . Hx of adenomatous colonic polyps 05/09/2014  . Obesity (BMI 30-39.9) 07/30/2013  . Type 2 diabetes mellitus with circulatory disorder ([place]) 05/27/2011  . Hyperlipidemia associated with type 2 diabetes mellitus ([place]) 10/19/2008  . Hypertension associated with diabetes ([place]) 10/19/2008  . CORONARY ATHEROSCLEROSIS NATIVE CORONARY ARTERY 10/17/2008  . GERD (gastroesophageal reflux disease) 12/27/2007   Past Medical History:  Diagnosis Date  . Anxiety disorder   . Aortic atherosclerosis ([place]) 12/19/2017  . Arthritis   .  Bursitis of hip   . Cervical spondylosis   . Chronic headaches   . Coronary atherosclerosis of native coronary artery    BMS nondominant RCA 12/2004  . Essential hypertension   . GERD (gastroesophageal reflux disease)   . History of adenomatous polyp of colon   . History of kidney stones   . History of MI (myocardial infarction) 12/2004  . History of viral meningitis 02/28/2018   December 2019  . Hyperlipidemia   . Internal hemorrhoids   . Low back pain   . Nocturia more than twice per night   . Numbness of left foot   . OSA (obstructive sleep apnea)   . Spinal stenosis   . Type 2 diabetes mellitus treated with insulin ([place])   . Urgency of urination   . Wears dentures    upper    Past Surgical History:  Procedure Laterality Date  . CARDIAC CATHETERIZATION  06-14-2004  dr Lia Foyer   total occlusion mD1, RI 30-40%, mRCA nondominant hazy 80% and scattered 30-40%,  ef 71% (medical mangement)  . CARDIOVASCULAR STRESS TEST  09-17-2015   dr Domenic Polite   Low risk nuclear study w/ reversible mild small anteroapical defect,  normal LV function and wall motion, nuclear stress ef 61%  . CATARACT EXTRACTION W/ INTRAOCULAR LENS IMPLANT Right 07/2014  . CHOLECYSTECTOMY    . COLONOSCOPY  09/10/2008   normal  . CORONARY ANGIOPLASTY WITH STENT PLACEMENT  12-17-2004   dr benismhon/ dr Olevia Perches   nonobstructive cad LAD and LCFx,  BMS x1 to total occlusion RCA   . CYSTOSCOPY W/ URETEROSCOPY W/ LITHOTRIPSY  08/2010  . ERCP  03/14/2008  . LAMINECTOMY    .  LAPAROSCOPIC CHOLECYSTECTOMY  05/2012  . LEFT HEART CATHETERIZATION WITH CORONARY ANGIOGRAM N/A 05/14/2011   Procedure: LEFT HEART CATHETERIZATION WITH CORONARY ANGIOGRAM;  Surgeon: Sherren Mocha, MD;  Location: [facility] CATH LAB;  Service: Cardiovascular;  Laterality: N/A;    non-obstructive LM, LAD, LCFx, patent RCA stent  . LUMBAR LAMINECTOMY/DECOMPRESSION MICRODISCECTOMY N/A 02/24/2017   Procedure: Microlumbar decompression L4-5, L5-S1;  Surgeon: Susa Day, MD;  Location: [facility] ORS;  Service: Orthopedics;  Laterality: N/A;  120 mins  . ROTATOR CUFF REPAIR Right 03/2002  . ROTATOR CUFF REPAIR Left 12/11/2015  . TOTAL HIP ARTHROPLASTY Right 12/20/2008  . TOTAL KNEE ARTHROPLASTY Bilateral left 12-11-2003/  right 07-31-2004   dr Wynelle Link [facility]  . UPPER GASTROINTESTINAL ENDOSCOPY  01/08/2008   bx, inlet patch, duodenitis  . YAG LASER APPLICATION Right 3/90/3009   Procedure: YAG LASER APPLICATION;  Surgeon: Williams Che, MD;  Location: [facility] ORS;  Service: Ophthalmology;  Laterality: Right;    No current facility-administered medications for this encounter.    Current Outpatient Medications  Medication Sig Dispense Refill Last Dose  . acetaminophen (TYLENOL) 650 MG CR tablet Take 1,300 mg by mouth 2 (two) times daily as needed for pain.    Taking  . amLODipine (NORVASC) 5 MG tablet Take 5 mg by mouth daily.   Taking  . aspirin EC 81 MG tablet Take 81 mg by mouth every evening.   Taking  . atorvastatin (LIPITOR) 20 MG tablet TAKE 1 TABLET BY MOUTH  DAILY AT 6 PM. (Patient taking differently: Take 20 mg by mouth every evening. ) 90 tablet 3 Taking  . butalbital-acetaminophen-caffeine (FIORICET, ESGIC) 50-325-40 MG tablet Take 1 tablet by mouth 2 (two) times daily as needed for headache. 14 tablet 0 Taking  . cetirizine (ZYRTEC) 10 MG tablet Take 10 mg by mouth every evening.    Taking  . Cholecalciferol (VITAMIN D3) 2000 UNITS TABS Take 2,000 Units by mouth every morning.    Taking  . famotidine (PEPCID) 20 MG tablet TAKE 1 TABLET BY MOUTH TWO  TIMES DAILY (Patient taking differently: Take 20 mg by mouth 2 (two) times daily. ) 180 tablet 3 Taking  . HUMALOG KWIKPEN 100 UNIT/ML KwikPen INJECT 0 TO 15 UNITS  SUBCUTANEOUSLY 3 TIMES  DAILY BEFORE MEALS  (70-150=8 UNITS, 151-200=9  UNITS) (Patient taking differently: Inject 0-10 Units into the skin 3 (three) times daily before meals. ) 45 mL 2 Taking  . LANTUS SOLOSTAR 100 UNIT/ML Solostar Pen INJECT  SUBCUTANEOUSLY 52  UNITS AT BEDTIME 60 mL 2   . losartan (COZAAR) 50 MG tablet TAKE 1 TABLET BY MOUTH  DAILY 90 tablet 3 Taking  . Menthol, Topical Analgesic, (BLUE-EMU MAXIMUM STRENGTH EX) Apply 1 application topically daily as needed (muscle pain).   Taking  . metFORMIN (GLUCOPHAGE-XR) 500 MG 24 hr tablet TAKE 2 TABLETS BY MOUTH TWO TIMES DAILY (Patient taking differently: Take 1,000 mg by mouth 2 (two) times daily. ) 360 tablet 3 Taking  . nitroGLYCERIN (NITROSTAT) 0.4 MG SL tablet DISSOLVE ONE TABLET UNDER THE TONGUE EVERY 5 MINUTES AS NEEDED FOR CHEST PAIN.  DO NOT EXCEED A TOTAL OF 3 DOSES IN 15 MINUTES (Patient taking differently: Place 0.4 mg under the tongue every 5 (five) minutes as needed. DISSOLVE ONE TABLET UNDER THE TONGUE EVERY 5 MINUTES AS NEEDED FOR CHEST PAIN.  DO NOT EXCEED A TOTAL OF 3 DOSES IN 15 MINUTES) 25 tablet 3 Taking  . ONE TOUCH ULTRA TEST test strip USE 1 STRIP TO CHECK  GLUCOSE 4 TIMES DAILY 400 each 3 Taking  . PARoxetine (PAXIL) 20 MG tablet Take 0.5 tablets (10 mg total) by mouth daily. (Patient taking differently: Take 20 mg by mouth daily. )   Taking  . polyethylene glycol (MIRALAX / GLYCOLAX) packet Take 17 g by mouth daily. (Patient taking differently: Take 17 g by mouth daily as needed for mild constipation. ) 14 each 0 Taking  . polyvinyl alcohol (LIQUIFILM TEARS) 1.4 % ophthalmic solution Place 1 drop into both eyes as needed for dry eyes. 15 mL 0 Taking  . tamsulosin (FLOMAX) 0.4 MG CAPS capsule TAKE 1 CAPSULE BY MOUTH  DAILY (Patient taking differently: Take 0.4 mg by mouth every evening. ) 90 capsule 3 Taking  . vitamin B-12 (CYANOCOBALAMIN) 1000 MCG tablet Take 2 tablets (2,000 mcg total) by mouth daily. 60 tablet 2 Taking   Allergies  Allergen Reactions  . Ace Inhibitors Hives, Swelling and Rash    Rash,hives,tongue swelling  . Angiotensin Receptor Blockers     Unknown reaction   . Ciprofloxacin Other (See Comments)    confusion  . Other Hives and  Other (See Comments)    Altaseptic, altase (cough)  . Oxycodone-Acetaminophen Other (See Comments)    REACTION: unknown reaction  . Ramipril Cough  . Robaxin [Methocarbamol] Other (See Comments)    Confusion   . Toradol [Ketorolac Tromethamine] Other (See Comments)    confusion  . Valium Other (See Comments)    Hallucinations; confusion  . Doxycycline Hives and Rash    Social History   Tobacco Use  . Smoking status: Former Smoker    Packs/day: 1.00    Years: 20.00    Pack years: 20.00    Types: Cigarettes    Start date: 01/18/1963    Last attempt to quit: 08/11/1988    Years since quitting: 29.8  . Smokeless tobacco: Former Systems developer    Types: Chew    Quit date: 01/11/1989  . Tobacco comment: chewed 1 pack tobacco/day for 15 years  Substance Use Topics  . Alcohol use: No    Alcohol/week: 0.0 standard drinks    Frequency: Never    Family History  Problem Relation Age of Onset  . Colon cancer Mother        Diagnosed age 35  . Cancer Mother 57  . Heart disease Father   . Heart attack Father   . Breast cancer Sister   . Heart disease Brother   . Brain cancer Sister   . Heart disease Brother   . Bladder Cancer Brother   . Cancer - Other Brother      ROS Constitutional: Constitutional: no fever, chills, night sweats, or significant weight loss. Cardiovascular: Cardiovascular: no palpitations or chest pain. Respiratory: Respiratory: no cough or shortness of breath and No COPD. Gastrointestinal: Gastrointestinal: no vomiting or nausea. Musculoskeletal: Musculoskeletal: no swelling in Joints and Joint Pain. Neurologic: Neurologic: no numbness, tingling, or difficulty with balance.  Objective:  Physical Exam Well nourished and well developed.  General: Alert and oriented x3, cooperative and pleasant, no acute distress.  Head: normocephalic, atraumatic, neck supple.  Eyes: EOMI.  Respiratory: breath sounds clear in all fields, no wheezing, rales, or rhonchi. Cardiovascular:  Regular rate and rhythm, no murmurs, gallops or rubs.  Abdomen: non-tender to palpation and soft, normoactive bowel sounds. Musculoskeletal:  Left Hip Exam: ROM: Flexion to 110, Internal Rotation 5, External Rotation 20, and Abduction 30 degrees with pain on internal rotation.  There is no tenderness over the greater  trochanter bursa.   Calves soft and nontender. Motor function intact in LE. Strength 5/5 LE bilaterally. Neuro: Distal pulses 2+. Sensation to light touch intact in LE.  Vital signs in last 24 hours: BP: 160/80 mmHg Pulse: 72 bpm  Labs:   Estimated body mass index is 36.45 kg/m as calculated from the following:   Height as of 05/15/18: 5\' 6"  (1.676 m).   Weight as of 05/15/18: 102.4 kg.   Imaging Review Plain radiographs demonstrate severe degenerative joint disease of the left hip(s). The bone quality appears to be adequate for age and reported activity level.      Assessment/Plan:  End stage arthritis, left hip(s)  The patient history, physical examination, clinical judgement of the provider and imaging studies are consistent with end stage degenerative joint disease of the left hip(s) and total hip arthroplasty is deemed medically necessary. The treatment options including medical management, injection therapy, arthroscopy and arthroplasty were discussed at length. The risks and benefits of total hip arthroplasty were presented and reviewed. The risks due to aseptic loosening, infection, stiffness, dislocation/subluxation,  thromboembolic complications and other imponderables were discussed.  The patient acknowledged the explanation, agreed to proceed with the plan and consent was signed. Patient is being admitted for inpatient treatment for surgery, pain control, PT, OT, prophylactic antibiotics, VTE prophylaxis, progressive ambulation and ADL's and discharge planning.The patient is planning to be discharged home.  Therapy Plans: HEP Disposition: Home with wife  Planned DVT Prophylaxis: Xarelto 10mg  daily (hx CAD w/ stent placement) DME needed: None PCP: Dr. Warrick Parisian Cardiologist: Dr. Domenic Polite TXA: IV Allergies: Ciprofloxacin, Doxycycline (hives), Oxycodone (AMS) Anesthesia Concerns: Sleep apnea BMI: 34.7 Last HgbA1c: 7.1%   - Patient was instructed on what medications to stop prior to surgery. - Follow-up visit in 2 weeks with Dr. Wynelle Link - Begin physical therapy following surgery - Pre-operative lab work as pre-surgical testing - Prescriptions will be provided in hospital at time of discharge  Theresa Duty, PA-C Orthopedic Surgery [facility] [place]

## 2018-06-20 ENCOUNTER — Telehealth: Payer: Self-pay | Admitting: Cardiology

## 2018-06-20 NOTE — Telephone Encounter (Signed)

## 2018-06-21 ENCOUNTER — Ambulatory Visit (INDEPENDENT_AMBULATORY_CARE_PROVIDER_SITE_OTHER): Payer: Medicare Other

## 2018-06-21 ENCOUNTER — Telehealth: Payer: Self-pay | Admitting: *Deleted

## 2018-06-21 DIAGNOSIS — R011 Cardiac murmur, unspecified: Secondary | ICD-10-CM

## 2018-06-21 NOTE — Telephone Encounter (Signed)
Patient informed. Copy sent to PCP °

## 2018-06-21 NOTE — Telephone Encounter (Signed)
-----   Message from Satira Sark, MD sent at 06/21/2018 12:57 PM EDT ----- Results reviewed.  LVEF remains normal, greater than 65%.  He does have mild to moderate calcific aortic stenosis which would fit with his heart murmur noted on last examination.  Keep follow-up as scheduled for review of symptoms and medications.

## 2018-06-22 DIAGNOSIS — G44009 Cluster headache syndrome, unspecified, not intractable: Secondary | ICD-10-CM | POA: Diagnosis not present

## 2018-06-22 DIAGNOSIS — H2512 Age-related nuclear cataract, left eye: Secondary | ICD-10-CM | POA: Diagnosis not present

## 2018-06-22 DIAGNOSIS — G4733 Obstructive sleep apnea (adult) (pediatric): Secondary | ICD-10-CM | POA: Diagnosis not present

## 2018-06-23 DIAGNOSIS — R350 Frequency of micturition: Secondary | ICD-10-CM | POA: Diagnosis not present

## 2018-06-23 DIAGNOSIS — R3915 Urgency of urination: Secondary | ICD-10-CM | POA: Diagnosis not present

## 2018-06-29 NOTE — Patient Instructions (Addendum)
[name]    Your procedure is scheduled on: 07-05-2018  Report to [facility] Main  Entrance  Report to admitting at [facility] TEST ON_______ @_______ , THIS TEST MUST BE DONE BEFORE SURGERY, COME TO [place]. ONCE YOUR COVID TEST IS COMPLETED, PLEASE BEGIN THE QUARANTINE INSTRUCTIONS AS OUTLINED IN YOUR HANDOUT.   Call this number if you have problems the morning of surgery [phone]    Remember: [place], NO CHEWING GUM CANDY OR MINTS.   NO SOLID FOOD AFTER MIDNIGHT THE NIGHT PRIOR TO SURGERY. NOTHING BY MOUTH EXCEPT CLEAR LIQUIDS UNTIL 415 AM. PLEASE FINISH G2  DRINK PER SURGEON ORDER 3 HOURS PRIOR TO SCHEDULED SURGERY TIME WHICH NEEDS TO BE COMPLETED AT 430 AM.   CLEAR LIQUID DIET   Foods Allowed                                                                     Foods Excluded  Coffee and tea, regular and decaf                             liquids that you cannot  Plain Jell-O in any flavor                                             see through such as: Fruit ices (not with fruit pulp)                                     milk, soups, orange juice  Iced Popsicles                                    All solid food Carbonated beverages, regular and diet                                    Cranberry, grape and apple juices Sports drinks like Gatorade Lightly seasoned clear broth or consume(fat free) Sugar, honey syrup  Sample Menu Breakfast                                Lunch                                     Supper Cranberry juice                    Beef broth  Chicken broth Jell-O                                     Grape juice                           Apple juice Coffee or tea                        Jell-O                                      Popsicle                                                 Coffee or tea                        Coffee or tea  _____________________________________________________________________  Bring mask and tubing to the hospital   Take these medicines the morning of surgery with A SIP OF WATER: AMLODIPINE (NORVASC), EYE DROPS, FAMOTIDINE (PEPCID), PAROXETINE (PAXIL)         How to Manage Your Diabetes Before and After Surgery  Why is it important to control my blood sugar before and after surgery? . Improving blood sugar levels before and after surgery helps healing and can limit problems. . A way of improving blood sugar control is eating a healthy diet by: o  Eating less sugar and carbohydrates o  Increasing activity/exercise o  Talking with your doctor about reaching your blood sugar goals . High blood sugars (greater than 180 mg/dL) can raise your risk of infections and slow your recovery, so you will need to focus on controlling your diabetes during the weeks before surgery. . Make sure that the doctor who takes care of your diabetes knows about your planned surgery including the date and location.  How do I manage my blood sugar before surgery? . Check your blood sugar at least 4 times a day, starting 2 days before surgery, to make sure that the level is not too high or low. o Check your blood sugar the morning of your surgery when you wake up and every 2 hours until you get to the Short Stay unit. . If your blood sugar is less than 70 mg/dL, you will need to treat for low blood sugar: o Do not take insulin. o Treat a low blood sugar (less than 70 mg/dL) with  cup of clear juice (cranberry or apple), 4 glucose tablets, OR glucose gel. o Recheck blood sugar in 15 minutes after treatment (to make sure it is greater than 70 mg/dL). If your blood sugar is not greater than 70 mg/dL on recheck, call [phone] for further instructions. . Report your blood sugar to the short stay nurse when you get to Short Stay.  . If you are admitted to the hospital  after surgery: o Your blood sugar will be checked by the staff and you will probably be given insulin after surgery (instead of oral diabetes medicines) to make sure you have good blood sugar levels. o The goal for blood sugar control after surgery is  80-180 mg/dL.   WHAT DO I DO ABOUT MY DIABETES MEDICATION?  .  THE DAY BEFORE SURGERY TAKE METFORMIN AS USUAL  . THE NIGHT  BEFORE SURGERY TAKE 1/2 DOSE BEDTIME LANTUS INSULIN ( TAKE 26 UNITS)       . THE DAY BEFORE SURGERY TAKE HULALOG INSULIN AS USUAL. SURGERY, take   units of         insulin.  . The day of surgery, do not take other diabetes injectables, including Byetta (exenatide), Bydureon (exenatide ER), Victoza (liraglutide), or Trulicity (dulaglutide).  .  THE DAY OF SURGERY If your CBG is greater than 220 mg/dL, you may take  of your sliding scale OF HUMALOG INSULIN  .  Marland Kitchen THE DAY OF SURGERY DO NOT TAKE METFORMIN OR LANTUS INSULIN.                            You may not have any metal on your body ipiercings             Do not wear jewelry,, lotions, powders , deodorant                         Men may shave face and neck.   Do not bring valuables to the hospital. [place].  Contacts, dentures or bridgework may not be worn into surgery.       _____________________________________________________________________             [facility] - Preparing for Surgery  Before surgery, you can play an important role.   Because skin is not sterile, your skin needs to be as free of germs as possible.   You can reduce the number of germs on your skin by washing with CHG (chlorahexidine gluconate) soap before surgery.  CHG is an antiseptic cleaner which kills germs and bonds with the skin to continue killing germs even after washing. Please DO NOT use if you have an allergy to CHG or antibacterial soaps.   If your skin becomes reddened/irritated stop using the CHG and inform your  nurse when you arrive at Short Stay.  Do not shave (including legs and underarms) for at least 48 hours prior to the first CHG shower.  You may shave your face/neck. Please follow these instructions carefully:   1.  Shower with CHG Soap the night before surgery and the  morning of Surgery.  2.  If you choose to wash your hair, wash your hair first as usual with your  normal  shampoo.  3.  After you shampoo, rinse your hair and body thoroughly to remove the  shampoo.                                        4.  Use CHG as you would any other liquid soap.  You can apply chg directly  to the skin and wash                       Gently with a scrungie or clean washcloth.  5.  Apply the CHG Soap to your body ONLY FROM THE NECK DOWN.   Do not use on face/ open  Wound or open sores. Avoid contact with eyes, ears mouth and genitals (private parts).                       Wash face,  Genitals (private parts) with your normal soap.             6.  Wash thoroughly, paying special attention to the area where your surgery  will be performed.  7.  Thoroughly rinse your body with warm water from the neck down.  8.  DO NOT shower/wash with your normal soap after using and rinsing off  the CHG Soap.                9.  Pat yourself dry with a clean towel.            10.  Wear clean pajamas.            11.  Place clean sheets on your bed the night of your first shower and do not  sleep with pets. Day of Surgery : Do not apply any lotions/deodorants the morning of surgery.  Please wear clean clothes to the hospital/surgery center.  FAILURE TO FOLLOW THESE INSTRUCTIONS MAY RESULT IN THE CANCELLATION OF YOUR SURGERY PATIENT SIGNATURE_________________________________  NURSE SIGNATURE__________________________________  ________________________________________________________________________   Adam Phenix  An incentive spirometer is a tool that can help keep your lungs clear and  active. This tool measures how well you are filling your lungs with each breath. Taking long deep breaths may help reverse or decrease the chance of developing breathing (pulmonary) problems (especially infection) following:  A long period of time when you are unable to move or be active. BEFORE THE PROCEDURE   If the spirometer includes an indicator to show your best effort, your nurse or respiratory therapist will set it to a desired goal.  If possible, sit up straight or lean slightly forward. Try not to slouch.  Hold the incentive spirometer in an upright position. INSTRUCTIONS FOR USE  1. Sit on the edge of your bed if possible, or sit up as far as you can in bed or on a chair. 2. Hold the incentive spirometer in an upright position. 3. Breathe out normally. 4. Place the mouthpiece in your mouth and seal your lips tightly around it. 5. Breathe in slowly and as deeply as possible, raising the piston or the ball toward the top of the column. 6. Hold your breath for 3-5 seconds or for as long as possible. Allow the piston or ball to fall to the bottom of the column. 7. Remove the mouthpiece from your mouth and breathe out normally. 8. Rest for a few seconds and repeat Steps 1 through 7 at least 10 times every 1-2 hours when you are awake. Take your time and take a few normal breaths between deep breaths. 9. The spirometer may include an indicator to show your best effort. Use the indicator as a goal to work toward during each repetition. 10. After each set of 10 deep breaths, practice coughing to be sure your lungs are clear. If you have an incision (the cut made at the time of surgery), support your incision when coughing by placing a pillow or rolled up towels firmly against it. Once you are able to get out of bed, walk around indoors and cough well. You may stop using the incentive spirometer when instructed by your caregiver.  RISKS AND COMPLICATIONS  Take your time so you do not get  dizzy or light-headed.  If you are in pain, you may need to take or ask for pain medication before doing incentive spirometry. It is harder to take a deep breath if you are having pain. AFTER USE  Rest and breathe slowly and easily.  It can be helpful to keep track of a log of your progress. Your caregiver can provide you with a simple table to help with this. If you are using the spirometer at home, follow these instructions: [place] IF:   You are having difficultly using the spirometer.  You have trouble using the spirometer as often as instructed.  Your pain medication is not giving enough relief while using the spirometer.  You develop fever of 100.5 F (38.1 C) or higher. SEEK IMMEDIATE MEDICAL CARE IF:   You cough up bloody sputum that had not been present before.  You develop fever of 102 F (38.9 C) or greater.  You develop worsening pain at or near the incision site. MAKE SURE YOU:   Understand these instructions.  Will watch your condition.  Will get help right away if you are not doing well or get worse. Document Released: 05/10/2006 Document Revised: 03/22/2011 Document Reviewed: 07/11/2006 ExitCare Patient Information 2014 ExitCare, [place].   ________________________________________________________________________  WHAT IS A BLOOD TRANSFUSION? Blood Transfusion Information  A transfusion is the replacement of blood or some of its parts. Blood is made up of multiple cells which provide different functions.  Red blood cells carry oxygen and are used for blood loss replacement.  White blood cells fight against infection.  Platelets control bleeding.  Plasma helps clot blood.  Other blood products are available for specialized needs, such as hemophilia or other clotting disorders. BEFORE THE TRANSFUSION  Who gives blood for transfusions?   Healthy volunteers who are fully evaluated to make sure their blood is safe. This is blood bank  blood. Transfusion therapy is the safest it has ever been in the practice of medicine. Before blood is taken from a donor, a complete history is taken to make sure that person has no history of diseases nor engages in risky social behavior (examples are intravenous drug use or sexual activity with multiple partners). The donor's travel history is screened to minimize risk of transmitting infections, such as malaria. The donated blood is tested for signs of infectious diseases, such as HIV and hepatitis. The blood is then tested to be sure it is compatible with you in order to minimize the chance of a transfusion reaction. If you or a relative donates blood, this is often done in anticipation of surgery and is not appropriate for emergency situations. It takes many days to process the donated blood. RISKS AND COMPLICATIONS Although transfusion therapy is very safe and saves many lives, the main dangers of transfusion include:   Getting an infectious disease.  Developing a transfusion reaction. This is an allergic reaction to something in the blood you were given. Every precaution is taken to prevent this. The decision to have a blood transfusion has been considered carefully by your caregiver before blood is given. Blood is not given unless the benefits outweigh the risks. AFTER THE TRANSFUSION  Right after receiving a blood transfusion, you will usually feel much better and more energetic. This is especially true if your red blood cells have gotten low (anemic). The transfusion raises the level of the red blood cells which carry oxygen, and this usually causes an energy increase.  The nurse administering the transfusion will monitor  you carefully for complications. HOME CARE INSTRUCTIONS  No special instructions are needed after a transfusion. You may find your energy is better. Speak with your caregiver about any limitations on activity for underlying diseases you may have. SEEK MEDICAL CARE IF:    Your condition is not improving after your transfusion.  You develop redness or irritation at the intravenous (IV) site. SEEK IMMEDIATE MEDICAL CARE IF:  Any of the following symptoms occur over the next 12 hours:  Shaking chills.  You have a temperature by mouth above 102 F (38.9 C), not controlled by medicine.  Chest, back, or muscle pain.  People around you feel you are not acting correctly or are confused.  Shortness of breath or difficulty breathing.  Dizziness and fainting.  You get a rash or develop hives.  You have a decrease in urine output.  Your urine turns a dark color or changes to pink, red, or brown. Any of the following symptoms occur over the next 10 days:  You have a temperature by mouth above 102 F (38.9 C), not controlled by medicine.  Shortness of breath.  Weakness after normal activity.  The white part of the eye turns yellow (jaundice).  You have a decrease in the amount of urine or are urinating less often.  Your urine turns a dark color or changes to pink, red, or brown. Document Released: 12/26/1999 Document Revised: 03/22/2011 Document Reviewed: 08/14/2007 [facility] Patient Information 2014 [place], [place].  _______________________________________________________________________

## 2018-06-29 NOTE — Progress Notes (Signed)
CARDIAC CLEARANCE DR MCDOWELL 03-21-18 Epic EKG 12-18-17 Epic ECHO 06-21-18 Epic CHEST 1 VIEW XRAY 12-18-17 EPIC

## 2018-06-30 ENCOUNTER — Other Ambulatory Visit: Payer: Self-pay

## 2018-06-30 ENCOUNTER — Encounter (HOSPITAL_COMMUNITY): Payer: Self-pay

## 2018-06-30 ENCOUNTER — Encounter (HOSPITAL_COMMUNITY)
Admission: RE | Admit: 2018-06-30 | Discharge: 2018-06-30 | Disposition: A | Discharge status: Home / Self Care | Payer: Medicare Other | Source: Ambulatory Visit | Attending: Orthopedic Surgery | Admitting: Orthopedic Surgery

## 2018-06-30 DIAGNOSIS — Z7982 Long term (current) use of aspirin: Secondary | ICD-10-CM | POA: Diagnosis not present

## 2018-06-30 DIAGNOSIS — E785 Hyperlipidemia, unspecified: Secondary | ICD-10-CM | POA: Diagnosis not present

## 2018-06-30 DIAGNOSIS — G4733 Obstructive sleep apnea (adult) (pediatric): Secondary | ICD-10-CM | POA: Diagnosis not present

## 2018-06-30 DIAGNOSIS — Z794 Long term (current) use of insulin: Secondary | ICD-10-CM | POA: Insufficient documentation

## 2018-06-30 DIAGNOSIS — Z1159 Encounter for screening for other viral diseases: Secondary | ICD-10-CM | POA: Insufficient documentation

## 2018-06-30 DIAGNOSIS — M1612 Unilateral primary osteoarthritis, left hip: Secondary | ICD-10-CM | POA: Insufficient documentation

## 2018-06-30 DIAGNOSIS — Z96653 Presence of artificial knee joint, bilateral: Secondary | ICD-10-CM | POA: Insufficient documentation

## 2018-06-30 DIAGNOSIS — E119 Type 2 diabetes mellitus without complications: Secondary | ICD-10-CM | POA: Insufficient documentation

## 2018-06-30 DIAGNOSIS — F419 Anxiety disorder, unspecified: Secondary | ICD-10-CM | POA: Insufficient documentation

## 2018-06-30 DIAGNOSIS — Z87891 Personal history of nicotine dependence: Secondary | ICD-10-CM | POA: Insufficient documentation

## 2018-06-30 DIAGNOSIS — Z955 Presence of coronary angioplasty implant and graft: Secondary | ICD-10-CM | POA: Diagnosis not present

## 2018-06-30 DIAGNOSIS — K219 Gastro-esophageal reflux disease without esophagitis: Secondary | ICD-10-CM | POA: Diagnosis not present

## 2018-06-30 DIAGNOSIS — I2510 Atherosclerotic heart disease of native coronary artery without angina pectoris: Secondary | ICD-10-CM | POA: Diagnosis not present

## 2018-06-30 DIAGNOSIS — Z01818 Encounter for other preprocedural examination: Secondary | ICD-10-CM | POA: Insufficient documentation

## 2018-06-30 DIAGNOSIS — I10 Essential (primary) hypertension: Secondary | ICD-10-CM | POA: Diagnosis not present

## 2018-06-30 DIAGNOSIS — Z79899 Other long term (current) drug therapy: Secondary | ICD-10-CM | POA: Diagnosis not present

## 2018-06-30 DIAGNOSIS — Z96641 Presence of right artificial hip joint: Secondary | ICD-10-CM | POA: Diagnosis not present

## 2018-06-30 LAB — CBC
HCT: 43.2 % (ref 39.0–52.0)
Hemoglobin: 14 g/dL (ref 13.0–17.0)
MCH: 29.9 pg (ref 26.0–34.0)
MCHC: 32.4 g/dL (ref 30.0–36.0)
MCV: 92.1 fL (ref 80.0–100.0)
Platelets: 273 10*3/uL (ref 150–400)
RBC: 4.69 MIL/uL (ref 4.22–5.81)
RDW: 14.1 % (ref 11.5–15.5)
WBC: 7.6 10*3/uL (ref 4.0–10.5)
nRBC: 0 % (ref 0.0–0.2)

## 2018-06-30 LAB — PROTIME-INR
INR: 0.9 (ref 0.8–1.2)
Prothrombin Time: 11.9 seconds (ref 11.4–15.2)

## 2018-06-30 LAB — COMPREHENSIVE METABOLIC PANEL
ALT: 21 U/L (ref 0–44)
AST: 21 U/L (ref 15–41)
Albumin: 3.9 g/dL (ref 3.5–5.0)
Alkaline Phosphatase: 69 U/L (ref 38–126)
Anion gap: 10 (ref 5–15)
BUN: 20 mg/dL (ref 8–23)
CO2: 25 mmol/L (ref 22–32)
Calcium: 9.4 mg/dL (ref 8.9–10.3)
Chloride: 103 mmol/L (ref 98–111)
Creatinine, Ser: 0.83 mg/dL (ref 0.61–1.24)
GFR calc Af Amer: >60 mL/min (ref >60)
GFR calc non Af Amer: >60 mL/min (ref >60)
Glucose, Bld: 95 mg/dL (ref 70–99)
Potassium: 4.2 mmol/L (ref 3.5–5.1)
Sodium: 138 mmol/L (ref 135–145)
Total Bilirubin: 0.6 mg/dL (ref 0.3–1.2)
Total Protein: 7.6 g/dL (ref 6.5–8.1)

## 2018-06-30 LAB — APTT: aPTT: 27 seconds (ref 24–36)

## 2018-06-30 LAB — HEMOGLOBIN A1C
Hgb A1c MFr Bld: 7.2 % — ABNORMAL HIGH (ref 4.8–5.6)
Mean Plasma Glucose: 159.94 mg/dL

## 2018-06-30 LAB — SURGICAL PCR SCREEN
MRSA, PCR: NEGATIVE
Staphylococcus aureus: NEGATIVE

## 2018-06-30 LAB — GLUCOSE, CAPILLARY: Glucose-Capillary: 109 mg/dL — ABNORMAL HIGH (ref 70–99)

## 2018-06-30 NOTE — Progress Notes (Signed)
Konrad Felix PA  Dr. Wynelle Link instructed Pt to stop ASA 06/30/18 Dr. Warrick Parisian manages it

## 2018-07-01 ENCOUNTER — Other Ambulatory Visit (HOSPITAL_COMMUNITY)
Admission: RE | Admit: 2018-07-01 | Discharge: 2018-07-01 | Disposition: A | Discharge status: Home / Self Care | Payer: Medicare Other | Source: Ambulatory Visit | Attending: Orthopedic Surgery | Admitting: Orthopedic Surgery

## 2018-07-01 DIAGNOSIS — Z96641 Presence of right artificial hip joint: Secondary | ICD-10-CM | POA: Diagnosis not present

## 2018-07-01 DIAGNOSIS — G4733 Obstructive sleep apnea (adult) (pediatric): Secondary | ICD-10-CM | POA: Diagnosis not present

## 2018-07-01 DIAGNOSIS — Z96653 Presence of artificial knee joint, bilateral: Secondary | ICD-10-CM | POA: Diagnosis not present

## 2018-07-01 DIAGNOSIS — I2510 Atherosclerotic heart disease of native coronary artery without angina pectoris: Secondary | ICD-10-CM | POA: Diagnosis not present

## 2018-07-01 DIAGNOSIS — Z955 Presence of coronary angioplasty implant and graft: Secondary | ICD-10-CM | POA: Diagnosis not present

## 2018-07-01 DIAGNOSIS — Z7982 Long term (current) use of aspirin: Secondary | ICD-10-CM | POA: Diagnosis not present

## 2018-07-01 DIAGNOSIS — E119 Type 2 diabetes mellitus without complications: Secondary | ICD-10-CM | POA: Diagnosis not present

## 2018-07-01 DIAGNOSIS — Z794 Long term (current) use of insulin: Secondary | ICD-10-CM | POA: Diagnosis not present

## 2018-07-01 DIAGNOSIS — K219 Gastro-esophageal reflux disease without esophagitis: Secondary | ICD-10-CM | POA: Diagnosis not present

## 2018-07-01 DIAGNOSIS — Z79899 Other long term (current) drug therapy: Secondary | ICD-10-CM | POA: Diagnosis not present

## 2018-07-01 DIAGNOSIS — Z1159 Encounter for screening for other viral diseases: Secondary | ICD-10-CM | POA: Diagnosis not present

## 2018-07-01 DIAGNOSIS — I10 Essential (primary) hypertension: Secondary | ICD-10-CM | POA: Diagnosis not present

## 2018-07-01 DIAGNOSIS — Z87891 Personal history of nicotine dependence: Secondary | ICD-10-CM | POA: Diagnosis not present

## 2018-07-01 DIAGNOSIS — E785 Hyperlipidemia, unspecified: Secondary | ICD-10-CM | POA: Diagnosis not present

## 2018-07-01 DIAGNOSIS — M1612 Unilateral primary osteoarthritis, left hip: Secondary | ICD-10-CM | POA: Diagnosis not present

## 2018-07-01 DIAGNOSIS — Z01818 Encounter for other preprocedural examination: Secondary | ICD-10-CM | POA: Diagnosis not present

## 2018-07-01 LAB — SARS CORONAVIRUS 2 (TAT 6-24 HRS): SARS Coronavirus 2: NEGATIVE

## 2018-07-03 ENCOUNTER — Telehealth: Payer: Self-pay | Admitting: *Deleted

## 2018-07-03 DIAGNOSIS — G4733 Obstructive sleep apnea (adult) (pediatric): Secondary | ICD-10-CM | POA: Diagnosis not present

## 2018-07-03 NOTE — Progress Notes (Cosign Needed)
Anesthesia Chart Review   Case: [id number] Date/Time: 07/05/18 0700   Procedure: TOTAL HIP ARTHROPLASTY ANTERIOR APPROACH (Left ) - 146min   Anesthesia type: Choice   Pre-op diagnosis: left hip osteoarthritis   Location: [facility] ROOM 10 / [facility]   Surgeon: Gaynelle Arabian, MD      DISCUSSION: 78 yo former smoker (20 pack years, quit 08/11/88) with h/o anxiety, GERD, OSA, HTN, HLD, DM II, mild-moderate AS (Echo 06/21/2018 valve area of 1.28 cm, AV Mean Grad:17.5 mmHg), CAD (BMS 2006), left hip OA scheduled for above procedure 07/04/2000 with Dr. Gaynelle Arabian.   Pt last seen by cardiologist, Dr. Rozann Lesches, 03/21/2018.  Per OV note, "RCRI risk calculator indicates class III, 6.6% risk of perioperative major cardiac event.  Echo done after this visit which showed mil-moderate AS.  Cardiology contacted to ensure pt is still cleared to proceed with planned procedure.  Per Bernerd Pho, PA-C, "By Dr. Myles Gip most recent office note, he was felt to be intermediate risk for his upcoming surgery. Echo showed a preserved EF of 65% with mild to moderate AS which does not hinder him from proceeding but would be consistent with previously documented intermediate risk. No further cardiac testing indicated at this time."  Pt can proceed with planned procedure barring acute status change.  VS: BP (!) 155/66   Temp 36.8 C   Resp 20   Ht 5\' 6"  (1.676 m)   Wt 103.9 kg   BMI 36.96 kg/m   PROVIDERS: Dettinger, Fransisca Kaufmann, MD is PCP   Rozann Lesches, MD is Cardiologist  LABS: Labs reviewed: Acceptable for surgery. (all labs ordered are listed, but only abnormal results are displayed)  Labs Reviewed  GLUCOSE, CAPILLARY - Abnormal; Notable for the following components:      Result Value   Glucose-Capillary 109 (*)    All other components within normal limits  HEMOGLOBIN A1C - Abnormal; Notable for the following components:   Hgb A1c MFr Bld 7.2 (*)    All other components within normal limits   SURGICAL PCR SCREEN  APTT  CBC  COMPREHENSIVE METABOLIC PANEL  PROTIME-INR  TYPE AND SCREEN     IMAGES:   EKG: 12/19/2017 Rate 76 bpm Sinus rhythm   CV: Echo 06/21/2018 IMPRESSIONS    1. The left ventricle has hyperdynamic systolic function, with an ejection fraction of >65%. The cavity size was normal. There is moderately increased left ventricular wall thickness. Left ventricular diastolic Doppler parameters are consistent with  impaired relaxation.  2. The right ventricle has normal systolic function. The cavity was normal. There is no increase in right ventricular wall thickness.  3. The aortic valve is tricuspid. Severely thickening of the aortic valve. Severe calcifcation of the aortic valve. Mild-moderate stenosis of the aortic valve. Severe aortic annular calcification noted.  4. The mitral valve is abnormal. Mild thickening of the mitral valve leaflet. Mild calcification of the mitral valve leaflet. There is mild mitral annular calcification present. No evidence of mitral valve stenosis.  5. There is mild dilatation of the aortic root measuring 41 mm.  6. Pulmonary hypertension is indeterminant, inadequate TR jet. Past Medical History:  Diagnosis Date  . Anxiety disorder   . Aortic atherosclerosis ([place]) 12/19/2017  . Arthritis   . Bursitis of hip   . Cervical spondylosis   . Chronic headaches   . Coronary atherosclerosis of native coronary artery    BMS nondominant RCA 12/2004  . Essential hypertension   . GERD (gastroesophageal reflux disease)   .  History of adenomatous polyp of colon   . History of kidney stones   . History of MI (myocardial infarction) 12/2004  . History of viral meningitis 02/28/2018   December 2019  . Hyperlipidemia   . Internal hemorrhoids   . Low back pain   . Nocturia more than twice per night   . Numbness of left foot   . OSA (obstructive sleep apnea)   . Spinal stenosis   . Type 2 diabetes mellitus treated with insulin ([place])    . Urgency of urination   . Wears dentures    upper    Past Surgical History:  Procedure Laterality Date  . CARDIAC CATHETERIZATION  06-14-2004  dr Lia Foyer   total occlusion mD1, RI 30-40%, mRCA nondominant hazy 80% and scattered 30-40%,  ef 71% (medical mangement)  . CARDIOVASCULAR STRESS TEST  09-17-2015   dr Domenic Polite   Low risk nuclear study w/ reversible mild small anteroapical defect,  normal LV function and wall motion, nuclear stress ef 61%  . CATARACT EXTRACTION W/ INTRAOCULAR LENS IMPLANT Right 07/2014  . CHOLECYSTECTOMY    . COLONOSCOPY  09/10/2008   normal  . CORONARY ANGIOPLASTY WITH STENT PLACEMENT  12-17-2004   dr benismhon/ dr Olevia Perches   nonobstructive cad LAD and LCFx,  BMS x1 to total occlusion RCA   . CYSTOSCOPY W/ URETEROSCOPY W/ LITHOTRIPSY  08/2010  . ERCP  03/14/2008  . LAMINECTOMY    . LAPAROSCOPIC CHOLECYSTECTOMY  05/2012  . LEFT HEART CATHETERIZATION WITH CORONARY ANGIOGRAM N/A 05/14/2011   Procedure: LEFT HEART CATHETERIZATION WITH CORONARY ANGIOGRAM;  Surgeon: Sherren Mocha, MD;  Location: [facility] CATH LAB;  Service: Cardiovascular;  Laterality: N/A;    non-obstructive LM, LAD, LCFx, patent RCA stent  . LUMBAR LAMINECTOMY/DECOMPRESSION MICRODISCECTOMY N/A 02/24/2017   Procedure: Microlumbar decompression L4-5, L5-S1;  Surgeon: Susa Day, MD;  Location: [facility];  Service: Orthopedics;  Laterality: N/A;  120 mins  . ROTATOR CUFF REPAIR Right 03/2002  . ROTATOR CUFF REPAIR Left 12/11/2015  . TOTAL HIP ARTHROPLASTY Right 12/20/2008  . TOTAL KNEE ARTHROPLASTY Bilateral left 12-11-2003/  right 07-31-2004   dr Wynelle Link [facility]  . UPPER GASTROINTESTINAL ENDOSCOPY  01/08/2008   bx, inlet patch, duodenitis  . YAG LASER APPLICATION Right 2/70/6237   Procedure: YAG LASER APPLICATION;  Surgeon: Williams Che, MD;  Location: [facility];  Service: Ophthalmology;  Laterality: Right;    MEDICATIONS: . acetaminophen (TYLENOL) 650 MG CR tablet  . amLODipine (NORVASC) 5 MG tablet  .  aspirin EC 81 MG tablet  . atorvastatin (LIPITOR) 20 MG tablet  . butalbital-acetaminophen-caffeine (FIORICET, ESGIC) 50-325-40 MG tablet  . cetirizine (ZYRTEC) 10 MG tablet  . Cholecalciferol (VITAMIN D3) 2000 UNITS TABS  . famotidine (PEPCID) 20 MG tablet  . HUMALOG KWIKPEN 100 UNIT/ML KwikPen  . ketorolac (ACULAR) 0.4 % SOLN  . LANTUS SOLOSTAR 100 UNIT/ML Solostar Pen  . losartan (COZAAR) 50 MG tablet  . metFORMIN (GLUCOPHAGE-XR) 500 MG 24 hr tablet  . nitroGLYCERIN (NITROSTAT) 0.4 MG SL tablet  . ONE TOUCH ULTRA TEST test strip  . PARoxetine (PAXIL) 20 MG tablet  . polyethylene glycol (MIRALAX / GLYCOLAX) packet  . polyvinyl alcohol (LIQUIFILM TEARS) 1.4 % ophthalmic solution  . prednisoLONE acetate (PRED FORTE) 1 % ophthalmic suspension  . tamsulosin (FLOMAX) 0.4 MG CAPS capsule  . tobramycin (TOBREX) 0.3 % ophthalmic solution  . vitamin B-12 (CYANOCOBALAMIN) 1000 MCG tablet   No current facility-administered medications for this encounter.     Konrad Felix, PA-C  [facility] [phone] 07/04/18 12:24 PM

## 2018-07-03 NOTE — Telephone Encounter (Signed)
Is he still cleared for left hip replacement after recent echo results. Surgery 07/05/2018.  Please fax response [phone]

## 2018-07-04 NOTE — Telephone Encounter (Signed)
    By Dr. Myles Gip most recent office note, he was felt to be intermediate risk for his upcoming surgery. Echo showed a preserved EF of 65% with mild to moderate AS which does not hinder him from proceeding but would be consistent with previously documented intermediate risk. No further cardiac testing indicated at this time.   Signed, Erma Heritage, PA-C 07/04/2018, 7:24 AM Pager: [phone]

## 2018-07-04 NOTE — Telephone Encounter (Signed)
Response faxed to [facility].

## 2018-07-04 NOTE — Anesthesia Preprocedure Evaluation (Addendum)
Anesthesia Evaluation  Patient identified by MRN, date of birth, ID band Patient awake    Reviewed: Allergy & Precautions, NPO status , Patient's Chart, lab work & pertinent test results  Airway Mallampati: I  TM Distance: >3 FB Neck ROM: Full    Dental   Pulmonary former smoker,    Pulmonary exam normal        Cardiovascular hypertension, Pt. on medications Normal cardiovascular exam     Neuro/Psych Anxiety    GI/Hepatic GERD  Medicated and Controlled,  Endo/Other  diabetes, Type 2, Oral Hypoglycemic Agents  Renal/GU      Musculoskeletal   Abdominal   Peds  Hematology   Anesthesia Other Findings   Reproductive/Obstetrics                            Anesthesia Physical Anesthesia Plan  ASA: II  Anesthesia Plan: General   Post-op Pain Management:    Induction: Intravenous  PONV Risk Score and Plan: 2 and Ondansetron and Treatment may vary due to age or medical condition  Airway Management Planned: Oral ETT  Additional Equipment:   Intra-op Plan:   Post-operative Plan: Extubation in OR  Informed Consent: I have reviewed the patients History and Physical, chart, labs and discussed the procedure including the risks, benefits and alternatives for the proposed anesthesia with the patient or authorized representative who has indicated his/her understanding and acceptance.       Plan Discussed with: CRNA and Surgeon  Anesthesia Plan Comments: (See PAT note 06/30/2018, Konrad Felix, PA-C)      Anesthesia Quick Evaluation

## 2018-07-05 ENCOUNTER — Inpatient Hospital Stay (HOSPITAL_COMMUNITY): Payer: Medicare Other | Admitting: Anesthesiology

## 2018-07-05 ENCOUNTER — Ambulatory Visit (HOSPITAL_COMMUNITY): Payer: Medicare Other

## 2018-07-05 ENCOUNTER — Encounter (HOSPITAL_COMMUNITY): Payer: Self-pay

## 2018-07-05 ENCOUNTER — Inpatient Hospital Stay (HOSPITAL_COMMUNITY): Payer: Medicare Other | Admitting: Physician Assistant

## 2018-07-05 ENCOUNTER — Other Ambulatory Visit: Payer: Self-pay

## 2018-07-05 ENCOUNTER — Inpatient Hospital Stay (HOSPITAL_COMMUNITY)
Admission: RE | Admit: 2018-07-05 | Discharge: 2018-07-06 | DRG: 470 | Disposition: A | Discharge status: Home / Self Care | Payer: Medicare Other | Attending: Orthopedic Surgery | Admitting: Orthopedic Surgery

## 2018-07-05 ENCOUNTER — Inpatient Hospital Stay (HOSPITAL_COMMUNITY): Payer: Medicare Other

## 2018-07-05 ENCOUNTER — Encounter (HOSPITAL_COMMUNITY)
Admission: RE | Disposition: A | Discharge status: Home / Self Care | Payer: Self-pay | Source: Home / Self Care | Attending: Orthopedic Surgery

## 2018-07-05 DIAGNOSIS — Z96642 Presence of left artificial hip joint: Secondary | ICD-10-CM | POA: Diagnosis not present

## 2018-07-05 DIAGNOSIS — I152 Hypertension secondary to endocrine disorders: Secondary | ICD-10-CM | POA: Diagnosis present

## 2018-07-05 DIAGNOSIS — M25752 Osteophyte, left hip: Secondary | ICD-10-CM | POA: Diagnosis not present

## 2018-07-05 DIAGNOSIS — M47812 Spondylosis without myelopathy or radiculopathy, cervical region: Secondary | ICD-10-CM | POA: Diagnosis not present

## 2018-07-05 DIAGNOSIS — Z419 Encounter for procedure for purposes other than remedying health state, unspecified: Secondary | ICD-10-CM

## 2018-07-05 DIAGNOSIS — E1169 Type 2 diabetes mellitus with other specified complication: Secondary | ICD-10-CM | POA: Diagnosis present

## 2018-07-05 DIAGNOSIS — M1610 Unilateral primary osteoarthritis, unspecified hip: Secondary | ICD-10-CM | POA: Diagnosis present

## 2018-07-05 DIAGNOSIS — Z9841 Cataract extraction status, right eye: Secondary | ICD-10-CM

## 2018-07-05 DIAGNOSIS — Z87442 Personal history of urinary calculi: Secondary | ICD-10-CM

## 2018-07-05 DIAGNOSIS — Z96653 Presence of artificial knee joint, bilateral: Secondary | ICD-10-CM | POA: Diagnosis not present

## 2018-07-05 DIAGNOSIS — K219 Gastro-esophageal reflux disease without esophagitis: Secondary | ICD-10-CM | POA: Diagnosis not present

## 2018-07-05 DIAGNOSIS — Z794 Long term (current) use of insulin: Secondary | ICD-10-CM | POA: Diagnosis not present

## 2018-07-05 DIAGNOSIS — Z885 Allergy status to narcotic agent status: Secondary | ICD-10-CM

## 2018-07-05 DIAGNOSIS — Z79899 Other long term (current) drug therapy: Secondary | ICD-10-CM | POA: Diagnosis not present

## 2018-07-05 DIAGNOSIS — I252 Old myocardial infarction: Secondary | ICD-10-CM | POA: Diagnosis not present

## 2018-07-05 DIAGNOSIS — Z96649 Presence of unspecified artificial hip joint: Secondary | ICD-10-CM

## 2018-07-05 DIAGNOSIS — Z8661 Personal history of infections of the central nervous system: Secondary | ICD-10-CM

## 2018-07-05 DIAGNOSIS — E785 Hyperlipidemia, unspecified: Secondary | ICD-10-CM | POA: Diagnosis present

## 2018-07-05 DIAGNOSIS — Z955 Presence of coronary angioplasty implant and graft: Secondary | ICD-10-CM

## 2018-07-05 DIAGNOSIS — Z7982 Long term (current) use of aspirin: Secondary | ICD-10-CM

## 2018-07-05 DIAGNOSIS — Z471 Aftercare following joint replacement surgery: Secondary | ICD-10-CM | POA: Diagnosis not present

## 2018-07-05 DIAGNOSIS — Z961 Presence of intraocular lens: Secondary | ICD-10-CM | POA: Diagnosis not present

## 2018-07-05 DIAGNOSIS — M169 Osteoarthritis of hip, unspecified: Secondary | ICD-10-CM | POA: Diagnosis present

## 2018-07-05 DIAGNOSIS — Z9181 History of falling: Secondary | ICD-10-CM

## 2018-07-05 DIAGNOSIS — Z881 Allergy status to other antibiotic agents status: Secondary | ICD-10-CM

## 2018-07-05 DIAGNOSIS — M1612 Unilateral primary osteoarthritis, left hip: Secondary | ICD-10-CM | POA: Diagnosis not present

## 2018-07-05 DIAGNOSIS — Z8249 Family history of ischemic heart disease and other diseases of the circulatory system: Secondary | ICD-10-CM

## 2018-07-05 DIAGNOSIS — N400 Enlarged prostate without lower urinary tract symptoms: Secondary | ICD-10-CM | POA: Diagnosis not present

## 2018-07-05 DIAGNOSIS — F419 Anxiety disorder, unspecified: Secondary | ICD-10-CM | POA: Diagnosis present

## 2018-07-05 DIAGNOSIS — Z888 Allergy status to other drugs, medicaments and biological substances status: Secondary | ICD-10-CM

## 2018-07-05 DIAGNOSIS — I10 Essential (primary) hypertension: Secondary | ICD-10-CM | POA: Diagnosis not present

## 2018-07-05 DIAGNOSIS — I700 Atherosclerosis of aorta: Secondary | ICD-10-CM | POA: Diagnosis present

## 2018-07-05 DIAGNOSIS — M48061 Spinal stenosis, lumbar region without neurogenic claudication: Secondary | ICD-10-CM | POA: Diagnosis present

## 2018-07-05 DIAGNOSIS — G4733 Obstructive sleep apnea (adult) (pediatric): Secondary | ICD-10-CM | POA: Diagnosis not present

## 2018-07-05 DIAGNOSIS — I2510 Atherosclerotic heart disease of native coronary artery without angina pectoris: Secondary | ICD-10-CM | POA: Diagnosis present

## 2018-07-05 DIAGNOSIS — Z87891 Personal history of nicotine dependence: Secondary | ICD-10-CM

## 2018-07-05 DIAGNOSIS — Z972 Presence of dental prosthetic device (complete) (partial): Secondary | ICD-10-CM

## 2018-07-05 DIAGNOSIS — Z9049 Acquired absence of other specified parts of digestive tract: Secondary | ICD-10-CM

## 2018-07-05 DIAGNOSIS — Z96641 Presence of right artificial hip joint: Secondary | ICD-10-CM | POA: Diagnosis not present

## 2018-07-05 DIAGNOSIS — Z803 Family history of malignant neoplasm of breast: Secondary | ICD-10-CM

## 2018-07-05 DIAGNOSIS — Z800 Family history of malignant neoplasm of digestive organs: Secondary | ICD-10-CM

## 2018-07-05 DIAGNOSIS — Z8052 Family history of malignant neoplasm of bladder: Secondary | ICD-10-CM

## 2018-07-05 DIAGNOSIS — Z808 Family history of malignant neoplasm of other organs or systems: Secondary | ICD-10-CM

## 2018-07-05 DIAGNOSIS — E119 Type 2 diabetes mellitus without complications: Secondary | ICD-10-CM | POA: Diagnosis not present

## 2018-07-05 HISTORY — PX: TOTAL HIP ARTHROPLASTY: SHX124

## 2018-07-05 LAB — GLUCOSE, CAPILLARY
Glucose-Capillary: 223 mg/dL — ABNORMAL HIGH (ref 70–99)
Glucose-Capillary: 237 mg/dL — ABNORMAL HIGH (ref 70–99)
Glucose-Capillary: 244 mg/dL — ABNORMAL HIGH (ref 70–99)
Glucose-Capillary: 235 mg/dL — ABNORMAL HIGH (ref 70–99)
Glucose-Capillary: 264 mg/dL — ABNORMAL HIGH (ref 70–99)

## 2018-07-05 LAB — TYPE AND SCREEN
ABO/RH(D): O POS
Antibody Screen: NEGATIVE

## 2018-07-05 SURGERY — ARTHROPLASTY, HIP, TOTAL, ANTERIOR APPROACH
Anesthesia: General | Site: Hip | Laterality: Left

## 2018-07-05 MED ORDER — KETOROLAC TROMETHAMINE 0.4 % OP SOLN
1.0000 [drp] | Freq: Four times a day (QID) | OPHTHALMIC | Status: DC
  Administered 2018-07-05: 1 [drp] via OPHTHALMIC

## 2018-07-05 MED ORDER — FENTANYL CITRATE (PF) 100 MCG/2ML IJ SOLN
INTRAMUSCULAR | Status: DC | PRN
  Administered 2018-07-05: 50 ug via INTRAVENOUS
  Administered 2018-07-05: 100 ug via INTRAVENOUS
  Administered 2018-07-05: 50 ug via INTRAVENOUS

## 2018-07-05 MED ORDER — DEXAMETHASONE SODIUM PHOSPHATE 10 MG/ML IJ SOLN
INTRAMUSCULAR | Status: DC | PRN
  Administered 2018-07-05: 8 mg via INTRAVENOUS

## 2018-07-05 MED ORDER — LIDOCAINE 2% (20 MG/ML) 5 ML SYRINGE
INTRAMUSCULAR | Status: DC | PRN
  Administered 2018-07-05: 100 mg via INTRAVENOUS

## 2018-07-05 MED ORDER — LACTATED RINGERS IV SOLN
INTRAVENOUS | Status: DC
  Administered 2018-07-05: 06:00:00 via INTRAVENOUS

## 2018-07-05 MED ORDER — NITROGLYCERIN 0.4 MG SL SUBL: 0.4000 mg | SUBLINGUAL_TABLET | SUBLINGUAL | Status: DC | PRN

## 2018-07-05 MED ORDER — INSULIN ASPART 100 UNIT/ML ~~LOC~~ SOLN
0.0000 [IU] | Freq: Three times a day (TID) | SUBCUTANEOUS | Status: DC
  Administered 2018-07-05 (×2): 5 [IU] via SUBCUTANEOUS
  Administered 2018-07-06 (×2): 3 [IU] via SUBCUTANEOUS

## 2018-07-05 MED ORDER — MEPERIDINE HCL 50 MG/ML IJ SOLN: 6.2500 mg | INTRAMUSCULAR | Status: DC | PRN

## 2018-07-05 MED ORDER — CHLORHEXIDINE GLUCONATE 4 % EX LIQD: 60.0000 mL | Freq: Once | CUTANEOUS | Status: DC

## 2018-07-05 MED ORDER — AMLODIPINE BESYLATE 5 MG PO TABS
5.0000 mg | ORAL_TABLET | Freq: Every day | ORAL | Status: DC
  Administered 2018-07-06: 5 mg via ORAL
  Filled 2018-07-05: qty 1

## 2018-07-05 MED ORDER — HYDROMORPHONE HCL 1 MG/ML IJ SOLN
0.2500 mg | INTRAMUSCULAR | Status: DC | PRN
  Administered 2018-07-05 (×4): 0.5 mg via INTRAVENOUS

## 2018-07-05 MED ORDER — TRANEXAMIC ACID-NACL 1000-0.7 MG/100ML-% IV SOLN
1000.0000 mg | INTRAVENOUS | Status: AC
  Administered 2018-07-05: 07:00:00 1000 mg via INTRAVENOUS
  Filled 2018-07-05: qty 100

## 2018-07-05 MED ORDER — PAROXETINE HCL 20 MG PO TABS: 20.0000 mg | ORAL_TABLET | Freq: Every day | ORAL | Status: DC

## 2018-07-05 MED ORDER — PHENYLEPHRINE HCL (PRESSORS) 10 MG/ML IV SOLN
INTRAVENOUS | Status: AC
  Filled 2018-07-05: qty 2

## 2018-07-05 MED ORDER — TRAMADOL HCL 50 MG PO TABS: 50.0000 mg | ORAL_TABLET | Freq: Four times a day (QID) | ORAL | Status: DC | PRN

## 2018-07-05 MED ORDER — KETOROLAC TROMETHAMINE 0.5 % OP SOLN
1.0000 [drp] | Freq: Four times a day (QID) | OPHTHALMIC | Status: DC
  Administered 2018-07-05 – 2018-07-06 (×5): 1 [drp] via OPHTHALMIC
  Filled 2018-07-05: qty 5

## 2018-07-05 MED ORDER — INSULIN GLARGINE 100 UNIT/ML SOLOSTAR PEN: 52.0000 [IU] | PEN_INJECTOR | Freq: Every day | SUBCUTANEOUS | Status: DC

## 2018-07-05 MED ORDER — FENTANYL CITRATE (PF) 100 MCG/2ML IJ SOLN
INTRAMUSCULAR | Status: AC
  Filled 2018-07-05: qty 2

## 2018-07-05 MED ORDER — CEFAZOLIN SODIUM-DEXTROSE 2-4 GM/100ML-% IV SOLN
2.0000 g | INTRAVENOUS | Status: AC
  Administered 2018-07-05: 2 g via INTRAVENOUS
  Filled 2018-07-05: qty 100

## 2018-07-05 MED ORDER — DIPHENHYDRAMINE HCL 12.5 MG/5ML PO ELIX: 12.5000 mg | ORAL_SOLUTION | ORAL | Status: DC | PRN

## 2018-07-05 MED ORDER — ROCURONIUM BROMIDE 10 MG/ML (PF) SYRINGE
PREFILLED_SYRINGE | INTRAVENOUS | Status: AC
  Filled 2018-07-05: qty 10

## 2018-07-05 MED ORDER — PROPOFOL 10 MG/ML IV BOLUS
INTRAVENOUS | Status: AC
  Filled 2018-07-05: qty 40

## 2018-07-05 MED ORDER — TAMSULOSIN HCL 0.4 MG PO CAPS
0.4000 mg | ORAL_CAPSULE | Freq: Every evening | ORAL | Status: DC
  Administered 2018-07-05: 0.4 mg via ORAL
  Filled 2018-07-05: qty 1

## 2018-07-05 MED ORDER — FAMOTIDINE 20 MG PO TABS
20.0000 mg | ORAL_TABLET | Freq: Two times a day (BID) | ORAL | Status: DC
  Administered 2018-07-05 – 2018-07-06 (×2): 20 mg via ORAL
  Filled 2018-07-05 (×2): qty 1

## 2018-07-05 MED ORDER — PHENOL 1.4 % MT LIQD
1.0000 | OROMUCOSAL | Status: DC | PRN
  Filled 2018-07-05: qty 177

## 2018-07-05 MED ORDER — MORPHINE SULFATE (PF) 2 MG/ML IV SOLN: 0.5000 mg | INTRAVENOUS | Status: DC | PRN

## 2018-07-05 MED ORDER — FLEET ENEMA 7-19 GM/118ML RE ENEM: 1.0000 | ENEMA | Freq: Once | RECTAL | Status: DC | PRN

## 2018-07-05 MED ORDER — DOCUSATE SODIUM 100 MG PO CAPS
100.0000 mg | ORAL_CAPSULE | Freq: Two times a day (BID) | ORAL | Status: DC
  Administered 2018-07-05 – 2018-07-06 (×2): 100 mg via ORAL
  Filled 2018-07-05 (×2): qty 1

## 2018-07-05 MED ORDER — HYDROMORPHONE HCL 1 MG/ML IJ SOLN
INTRAMUSCULAR | Status: AC
  Administered 2018-07-05: 0.5 mg via INTRAVENOUS
  Filled 2018-07-05: qty 2

## 2018-07-05 MED ORDER — METOCLOPRAMIDE HCL 5 MG/ML IJ SOLN: 5.0000 mg | Freq: Three times a day (TID) | INTRAMUSCULAR | Status: DC | PRN

## 2018-07-05 MED ORDER — POVIDONE-IODINE 10 % EX SWAB: 2.0000 "application " | Freq: Once | CUTANEOUS | Status: DC

## 2018-07-05 MED ORDER — METOCLOPRAMIDE HCL 5 MG PO TABS: 5.0000 mg | ORAL_TABLET | Freq: Three times a day (TID) | ORAL | Status: DC | PRN

## 2018-07-05 MED ORDER — LORATADINE 10 MG PO TABS
10.0000 mg | ORAL_TABLET | Freq: Every evening | ORAL | Status: DC
  Administered 2018-07-05: 10 mg via ORAL
  Filled 2018-07-05: qty 1

## 2018-07-05 MED ORDER — HYDROMORPHONE HCL 1 MG/ML IJ SOLN
INTRAMUSCULAR | Status: AC
  Administered 2018-07-05: 0.25 mg via INTRAVENOUS
  Filled 2018-07-05: qty 1

## 2018-07-05 MED ORDER — BISACODYL 10 MG RE SUPP: 10.0000 mg | Freq: Every day | RECTAL | Status: DC | PRN

## 2018-07-05 MED ORDER — LOSARTAN POTASSIUM 50 MG PO TABS
50.0000 mg | ORAL_TABLET | Freq: Every day | ORAL | Status: DC
  Administered 2018-07-06: 50 mg via ORAL
  Filled 2018-07-05: qty 1

## 2018-07-05 MED ORDER — ONDANSETRON HCL 4 MG/2ML IJ SOLN
INTRAMUSCULAR | Status: AC
  Filled 2018-07-05: qty 2

## 2018-07-05 MED ORDER — ATORVASTATIN CALCIUM 20 MG PO TABS
20.0000 mg | ORAL_TABLET | Freq: Every evening | ORAL | Status: DC
  Administered 2018-07-05: 20 mg via ORAL
  Filled 2018-07-05: qty 1

## 2018-07-05 MED ORDER — PREDNISOLONE ACETATE 1 % OP SUSP
1.0000 [drp] | Freq: Four times a day (QID) | OPHTHALMIC | Status: DC
  Administered 2018-07-05 – 2018-07-06 (×5): 1 [drp] via OPHTHALMIC
  Filled 2018-07-05: qty 5

## 2018-07-05 MED ORDER — CYCLOBENZAPRINE HCL 10 MG PO TABS
10.0000 mg | ORAL_TABLET | Freq: Three times a day (TID) | ORAL | Status: DC | PRN
  Administered 2018-07-06 (×2): 10 mg via ORAL
  Filled 2018-07-05 (×2): qty 1

## 2018-07-05 MED ORDER — RIVAROXABAN 10 MG PO TABS
10.0000 mg | ORAL_TABLET | Freq: Every day | ORAL | Status: DC
  Administered 2018-07-06: 10 mg via ORAL
  Filled 2018-07-05: qty 1

## 2018-07-05 MED ORDER — SUGAMMADEX SODIUM 200 MG/2ML IV SOLN
INTRAVENOUS | Status: DC | PRN
  Administered 2018-07-05: 200 mg via INTRAVENOUS

## 2018-07-05 MED ORDER — SODIUM CHLORIDE 0.9 % IV SOLN
INTRAVENOUS | Status: DC
  Administered 2018-07-05: 12:00:00 via INTRAVENOUS

## 2018-07-05 MED ORDER — MENTHOL 3 MG MT LOZG: 1.0000 | LOZENGE | OROMUCOSAL | Status: DC | PRN

## 2018-07-05 MED ORDER — POLYETHYLENE GLYCOL 3350 17 G PO PACK: 17.0000 g | PACK | Freq: Every day | ORAL | Status: DC | PRN

## 2018-07-05 MED ORDER — CEFAZOLIN SODIUM-DEXTROSE 2-4 GM/100ML-% IV SOLN
2.0000 g | Freq: Four times a day (QID) | INTRAVENOUS | Status: AC
  Administered 2018-07-05 (×2): 2 g via INTRAVENOUS
  Filled 2018-07-05 (×2): qty 100

## 2018-07-05 MED ORDER — 0.9 % SODIUM CHLORIDE (POUR BTL) OPTIME
TOPICAL | Status: DC | PRN
  Administered 2018-07-05: 1000 mL

## 2018-07-05 MED ORDER — ONDANSETRON HCL 4 MG/2ML IJ SOLN: 4.0000 mg | Freq: Once | INTRAMUSCULAR | Status: DC | PRN

## 2018-07-05 MED ORDER — ONDANSETRON HCL 4 MG PO TABS: 4.0000 mg | ORAL_TABLET | Freq: Four times a day (QID) | ORAL | Status: DC | PRN

## 2018-07-05 MED ORDER — LIDOCAINE 2% (20 MG/ML) 5 ML SYRINGE
INTRAMUSCULAR | Status: AC
  Filled 2018-07-05: qty 5

## 2018-07-05 MED ORDER — ACETAMINOPHEN 500 MG PO TABS
500.0000 mg | ORAL_TABLET | Freq: Four times a day (QID) | ORAL | Status: AC
  Administered 2018-07-05 – 2018-07-06 (×3): 500 mg via ORAL
  Filled 2018-07-05 (×2): qty 1

## 2018-07-05 MED ORDER — TOBRAMYCIN 0.3 % OP SOLN
1.0000 [drp] | Freq: Four times a day (QID) | OPHTHALMIC | Status: DC
  Administered 2018-07-05 – 2018-07-06 (×5): 1 [drp] via OPHTHALMIC
  Filled 2018-07-05: qty 5

## 2018-07-05 MED ORDER — PROPOFOL 10 MG/ML IV BOLUS
INTRAVENOUS | Status: DC | PRN
  Administered 2018-07-05: 100 mg via INTRAVENOUS

## 2018-07-05 MED ORDER — BUPIVACAINE-EPINEPHRINE (PF) 0.25% -1:200000 IJ SOLN
INTRAMUSCULAR | Status: AC
  Filled 2018-07-05: qty 30

## 2018-07-05 MED ORDER — DEXAMETHASONE SODIUM PHOSPHATE 10 MG/ML IJ SOLN
INTRAMUSCULAR | Status: AC
  Filled 2018-07-05: qty 1

## 2018-07-05 MED ORDER — ONDANSETRON HCL 4 MG/2ML IJ SOLN: 4.0000 mg | Freq: Four times a day (QID) | INTRAMUSCULAR | Status: DC | PRN

## 2018-07-05 MED ORDER — DEXAMETHASONE SODIUM PHOSPHATE 10 MG/ML IJ SOLN
10.0000 mg | Freq: Once | INTRAMUSCULAR | Status: AC
  Administered 2018-07-06: 10 mg via INTRAVENOUS
  Filled 2018-07-05: qty 1

## 2018-07-05 MED ORDER — HYDROMORPHONE HCL 1 MG/ML IJ SOLN
0.2500 mg | INTRAMUSCULAR | Status: DC | PRN
  Administered 2018-07-05 (×2): 0.25 mg via INTRAVENOUS

## 2018-07-05 MED ORDER — SUGAMMADEX SODIUM 200 MG/2ML IV SOLN
INTRAVENOUS | Status: AC
  Filled 2018-07-05: qty 2

## 2018-07-05 MED ORDER — INSULIN LISPRO (1 UNIT DIAL) 100 UNIT/ML (KWIKPEN): 0.0000 [IU] | PEN_INJECTOR | Freq: Three times a day (TID) | SUBCUTANEOUS | Status: DC

## 2018-07-05 MED ORDER — BUTALBITAL-APAP-CAFFEINE 50-325-40 MG PO TABS: 1.0000 | ORAL_TABLET | Freq: Two times a day (BID) | ORAL | Status: DC | PRN

## 2018-07-05 MED ORDER — INSULIN GLARGINE 100 UNIT/ML ~~LOC~~ SOLN
52.0000 [IU] | Freq: Every day | SUBCUTANEOUS | Status: DC
  Administered 2018-07-05: 52 [IU] via SUBCUTANEOUS
  Filled 2018-07-05 (×2): qty 0.52

## 2018-07-05 MED ORDER — HYDROCODONE-ACETAMINOPHEN 5-325 MG PO TABS
1.0000 | ORAL_TABLET | ORAL | Status: DC | PRN
  Administered 2018-07-05 – 2018-07-06 (×4): 2 via ORAL
  Filled 2018-07-05 (×4): qty 2

## 2018-07-05 MED ORDER — ONDANSETRON HCL 4 MG/2ML IJ SOLN
INTRAMUSCULAR | Status: DC | PRN
  Administered 2018-07-05: 4 mg via INTRAVENOUS

## 2018-07-05 MED ORDER — ROCURONIUM BROMIDE 10 MG/ML (PF) SYRINGE
PREFILLED_SYRINGE | INTRAVENOUS | Status: DC | PRN
  Administered 2018-07-05: 10 mg via INTRAVENOUS
  Administered 2018-07-05: 50 mg via INTRAVENOUS

## 2018-07-05 MED ORDER — SODIUM CHLORIDE 0.9 % IV SOLN
INTRAVENOUS | Status: DC | PRN
  Administered 2018-07-05: 25 ug/min via INTRAVENOUS

## 2018-07-05 MED ORDER — BUPIVACAINE-EPINEPHRINE (PF) 0.25% -1:200000 IJ SOLN
INTRAMUSCULAR | Status: DC | PRN
  Administered 2018-07-05: 30 mL

## 2018-07-05 SURGICAL SUPPLY — 48 items
ARTICULEZE HEAD (Hips) ×2 IMPLANT
BAG DECANTER FOR FLEXI CONT (MISCELLANEOUS) IMPLANT
BAG SPEC THK2 15X12 ZIP CLS (MISCELLANEOUS) ×1
BAG ZIPLOCK 12X15 (MISCELLANEOUS) ×1 IMPLANT
BLADE SAG 18X100X1.27 (BLADE) ×2 IMPLANT
COVER PERINEAL POST (MISCELLANEOUS) ×2 IMPLANT
COVER SURGICAL LIGHT HANDLE (MISCELLANEOUS) ×2 IMPLANT
COVER WAND RF STERILE (DRAPES) IMPLANT
CUP ACETBLR 54 OD PINNACLE (Hips) ×1 IMPLANT
DECANTER SPIKE VIAL GLASS SM (MISCELLANEOUS) ×2 IMPLANT
DRAPE STERI IOBAN 125X83 (DRAPES) ×2 IMPLANT
DRAPE U-SHAPE 47X51 STRL (DRAPES) ×4 IMPLANT
DRSG ADAPTIC 3X8 NADH LF (GAUZE/BANDAGES/DRESSINGS) ×2 IMPLANT
DRSG EMULSION OIL 3X3 NADH (GAUZE/BANDAGES/DRESSINGS) ×1 IMPLANT
DRSG MEPILEX BORDER 4X4 (GAUZE/BANDAGES/DRESSINGS) ×2 IMPLANT
DRSG MEPILEX BORDER 4X8 (GAUZE/BANDAGES/DRESSINGS) ×2 IMPLANT
DURAPREP 26ML APPLICATOR (WOUND CARE) ×2 IMPLANT
ELECT REM PT RETURN 15FT ADLT (MISCELLANEOUS) ×2 IMPLANT
EVACUATOR 1/8 PVC DRAIN (DRAIN) ×2 IMPLANT
GLOVE BIO SURGEON STRL SZ 6 (GLOVE) IMPLANT
GLOVE BIO SURGEON STRL SZ7 (GLOVE) IMPLANT
GLOVE BIO SURGEON STRL SZ8 (GLOVE) ×2 IMPLANT
GLOVE BIOGEL PI IND STRL 6.5 (GLOVE) IMPLANT
GLOVE BIOGEL PI IND STRL 7.0 (GLOVE) IMPLANT
GLOVE BIOGEL PI IND STRL 8 (GLOVE) ×1 IMPLANT
GLOVE BIOGEL PI INDICATOR 6.5 (GLOVE)
GLOVE BIOGEL PI INDICATOR 7.0 (GLOVE)
GLOVE BIOGEL PI INDICATOR 8 (GLOVE) ×1
GOWN STRL REUS W/TWL LRG LVL3 (GOWN DISPOSABLE) ×2 IMPLANT
GOWN STRL REUS W/TWL XL LVL3 (GOWN DISPOSABLE) IMPLANT
HEAD ARTICULEZE (Hips) IMPLANT
HOLDER FOLEY CATH W/STRAP (MISCELLANEOUS) ×2 IMPLANT
KIT TURNOVER KIT A (KITS) IMPLANT
LINER MARATHON NEUT +4X54X36 (Hips) ×1 IMPLANT
MANIFOLD NEPTUNE II (INSTRUMENTS) ×2 IMPLANT
PACK ANTERIOR HIP CUSTOM (KITS) ×2 IMPLANT
STEM FEMORAL SZ6 HIGH ACTIS (Stem) ×1 IMPLANT
STRIP CLOSURE SKIN 1/2X4 (GAUZE/BANDAGES/DRESSINGS) ×3 IMPLANT
SUT ETHIBOND NAB CT1 #1 30IN (SUTURE) ×2 IMPLANT
SUT MNCRL AB 4-0 PS2 18 (SUTURE) ×2 IMPLANT
SUT STRATAFIX 0 PDS 27 VIOLET (SUTURE) ×2
SUT VIC AB 2-0 CT1 27 (SUTURE) ×4
SUT VIC AB 2-0 CT1 TAPERPNT 27 (SUTURE) ×2 IMPLANT
SUTURE STRATFX 0 PDS 27 VIOLET (SUTURE) ×1 IMPLANT
SYR 50ML LL SCALE MARK (SYRINGE) IMPLANT
TRAY FOLEY MTR SLVR 16FR STAT (SET/KITS/TRAYS/PACK) ×2 IMPLANT
WATER STERILE IRR 1000ML POUR (IV SOLUTION) ×2 IMPLANT
YANKAUER SUCT BULB TIP 10FT TU (MISCELLANEOUS) ×2 IMPLANT

## 2018-07-05 NOTE — Evaluation (Signed)
Physical Therapy Evaluation Patient Details Name: [name] MRN: [id number] DOB: 1940/09/10 Today's Date: 07/05/2018   History of Present Illness  Pt s/p L THR and with hx of R THR, bil TKR and lumbar lami  Clinical Impression  Pt s/p L THR and presents with decreased L LE strength/ROM, post op pain, and obesity limiting functional mobility.  Pt should progress to dc home with assist of family.    Follow Up Recommendations Follow surgeon's recommendation for DC plan and follow-up therapies    Equipment Recommendations  None recommended by PT    Recommendations for Other Services       Precautions / Restrictions Precautions Precautions: Fall Restrictions Weight Bearing Restrictions: No Other Position/Activity Restrictions: WBAT      Mobility  Bed Mobility Overal bed mobility: Needs Assistance Bed Mobility: Supine to Sit;Sit to Supine     Supine to sit: Mod assist;+2 for physical assistance;+2 for safety/equipment Sit to supine: Min assist;+2 for physical assistance;+2 for safety/equipment   General bed mobility comments: cues for sequence, use of bed rail, and physical assist to manage L LE and to bring trunk to upright sitting  Transfers Overall transfer level: Needs assistance   Transfers: Sit to/from Stand Sit to Stand: Min assist;+2 physical assistance;+2 safety/equipment;From elevated surface         General transfer comment: cues for LE management and use of UEs to self assist  Ambulation/Gait Ambulation/Gait assistance: Min assist;+2 physical assistance;+2 safety/equipment Gait Distance (Feet): 5 Feet Assistive device: Rolling walker (2 wheeled) Gait Pattern/deviations: Step-to pattern;Decreased step length - right;Decreased step length - left;Shuffle;Trunk flexed Gait velocity: decr   General Gait Details: Pt side-stepped up side of bed only - fatigue limited.  Stairs            Wheelchair Mobility    Modified Rankin (Stroke Patients  Only)       Balance Overall balance assessment: Mild deficits observed, not formally tested                                           Pertinent Vitals/Pain Pain Assessment: 0-10 Pain Score: 4  Pain Location: L hip Pain Descriptors / Indicators: Aching;Sore Pain Intervention(s): Limited activity within patient's tolerance;Monitored during session;Premedicated before session;Ice applied    Home Living Family/patient expects to be discharged to:: Private residence Living Arrangements: Spouse/significant other Available Help at Discharge: Family Type of Home: House Home Access: Stairs to enter Entrance Stairs-Rails: Right;Left;Can reach both Entrance Stairs-Number of Steps: 2-3 Home Layout: Two level;Able to live on main level with bedroom/bathroom Home Equipment: Gilford Rile - 2 wheels;Bedside commode;Shower seat;Cane - single point;Hand held shower head      Prior Function Level of Independence: Independent         Comments: community ambulator, drives     Hand Dominance   Dominant Hand: Right    Extremity/Trunk Assessment   Upper Extremity Assessment Upper Extremity Assessment: Overall WFL for tasks assessed    Lower Extremity Assessment Lower Extremity Assessment: LLE deficits/detail       Communication   Communication: HOH  Cognition Arousal/Alertness: Awake/alert Behavior During Therapy: WFL for tasks assessed/performed Overall Cognitive Status: Within Functional Limits for tasks assessed  General Comments      Exercises Total Joint Exercises Ankle Circles/Pumps: AROM;Both;15 reps;Supine   Assessment/Plan    PT Assessment Patient needs continued PT services  PT Problem List Decreased strength;Decreased range of motion;Decreased activity tolerance;Decreased mobility;Pain;Decreased knowledge of use of DME       PT Treatment Interventions DME instruction;Gait training;Stair  training;Functional mobility training;Therapeutic activities;Therapeutic exercise;Patient/family education    PT Goals (Current goals can be found in the Care Plan section)  Acute Rehab PT Goals Patient Stated Goal: Regain IND PT Goal Formulation: With patient Time For Goal Achievement: 07/12/18 Potential to Achieve Goals: Good    Frequency 7X/week   Barriers to discharge        Co-evaluation               AM-PAC PT "6 Clicks" Mobility  Outcome Measure Help needed turning from your back to your side while in a flat bed without using bedrails?: A Lot Help needed moving from lying on your back to sitting on the side of a flat bed without using bedrails?: A Lot Help needed moving to and from a bed to a chair (including a wheelchair)?: A Lot Help needed standing up from a chair using your arms (e.g., wheelchair or bedside chair)?: A Lot Help needed to walk in hospital room?: A Lot Help needed climbing 3-5 steps with a railing? : A Lot 6 Click Score: 12    End of Session Equipment Utilized During Treatment: Gait belt Activity Tolerance: Patient tolerated treatment well Patient left: in bed;with call bell/phone within reach Nurse Communication: Mobility status PT Visit Diagnosis: Muscle weakness (generalized) (M62.81);Difficulty in walking, not elsewhere classified (R26.2)    Time: 2671-2458 PT Time Calculation (min) (ACUTE ONLY): 22 min   Charges:   PT Evaluation $PT Eval Low Complexity: 1 Low          [place] Pager [phone] Office [phone]   Ventura Hollenbeck 07/05/2018, 4:55 PM

## 2018-07-05 NOTE — Anesthesia Postprocedure Evaluation (Signed)
Anesthesia Post Note  Patient: [name]  Procedure(s) Performed: TOTAL HIP ARTHROPLASTY ANTERIOR APPROACH (Left Hip)     Patient location during evaluation: PACU Anesthesia Type: General Level of consciousness: awake and alert Pain management: pain level controlled Vital Signs Assessment: post-procedure vital signs reviewed and stable Respiratory status: spontaneous breathing, nonlabored ventilation, respiratory function stable and patient connected to nasal cannula oxygen Cardiovascular status: blood pressure returned to baseline and stable Postop Assessment: no apparent nausea or vomiting Anesthetic complications: no    Last Vitals:  Vitals:   07/05/18 1200 07/05/18 1246  BP: 136/65 (!) 158/79  Pulse: 93 96  Resp: 16 17  Temp: 36.8 C 36.7 C  SpO2: 95% 97%    Last Pain:  Vitals:   07/05/18 1158  TempSrc:   PainSc: Asleep                 Devlyn Parish DAVID

## 2018-07-05 NOTE — Discharge Instructions (Addendum)
°Dr. Frank Aluisio °Total Joint Specialist °[facility] °[address] °[place], [place] [address] °[phone] ° °ANTERIOR APPROACH TOTAL HIP REPLACEMENT POSTOPERATIVE DIRECTIONS ° ° °Hip Rehabilitation, Guidelines Following Surgery  °The results of a hip operation are greatly improved after range of motion and muscle strengthening exercises. Follow all safety measures which are given to protect your hip. If any of these exercises cause increased pain or swelling in your joint, decrease the amount until you are comfortable again. Then slowly increase the exercises. Call your caregiver if you have problems or questions.  ° °HOME CARE INSTRUCTIONS  °• Remove items at home which could result in a fall. This includes throw rugs or furniture in walking pathways.  °· ICE to the affected hip every three hours for 30 minutes at a time and then as needed for pain and swelling.  Continue to use ice on the hip for pain and swelling from surgery. You may notice swelling that will progress down to the foot and ankle.  This is normal after surgery.  Elevate the leg when you are not up walking on it.   °· Continue to use the breathing machine which will help keep your temperature down.  It is common for your temperature to cycle up and down following surgery, especially at night when you are not up moving around and exerting yourself.  The breathing machine keeps your lungs expanded and your temperature down. ° °DIET °You may resume your previous home diet once your are discharged from the hospital. ° °DRESSING / WOUND CARE / SHOWERING °You may change your dressing 3-5 days after surgery.  Then change the dressing every day with sterile gauze.  Please use good hand washing techniques before changing the dressing.  Do not use any lotions or creams on the incision until instructed by your surgeon. °You may start showering once you are discharged home but do not submerge the incision under water. Just pat the  incision dry and apply a dry gauze dressing on daily. °Change the surgical dressing daily and reapply a dry dressing each time. ° °ACTIVITY °Walk with your walker as instructed. °Use walker as long as suggested by your caregivers. °Avoid periods of inactivity such as sitting longer than an hour when not asleep. This helps prevent blood clots.  °You may resume a sexual relationship in one month or when given the OK by your doctor.  °You may return to work once you are cleared by your doctor.  °Do not drive a car for 6 weeks or until released by you surgeon.  °Do not drive while taking narcotics. ° °WEIGHT BEARING °Weight bearing as tolerated with assist device (walker, cane, etc) as directed, use it as long as suggested by your surgeon or therapist, typically at least 4-6 weeks. ° °POSTOPERATIVE CONSTIPATION PROTOCOL °Constipation - defined medically as fewer than three stools per week and severe constipation as less than one stool per week. ° °One of the most common issues patients have following surgery is constipation.  Even if you have a regular bowel pattern at home, your normal regimen is likely to be disrupted due to multiple reasons following surgery.  Combination of anesthesia, postoperative narcotics, change in appetite and fluid intake all can affect your bowels.  In order to avoid complications following surgery, here are some recommendations in order to help you during your recovery period. ° °Colace (docusate) - Pick up an over-the-counter form of Colace or another stool softener and take twice a day   as long as you are requiring postoperative pain medications.  Take with a full glass of water daily.  If you experience loose stools or diarrhea, hold the colace until you stool forms back up.  If your symptoms do not get better within 1 week or if they get worse, check with your doctor. ° °Dulcolax (bisacodyl) - Pick up over-the-counter and take as directed by the product packaging as needed to assist with  the movement of your bowels.  Take with a full glass of water.  Use this product as needed if not relieved by Colace only.  ° °MiraLax (polyethylene glycol) - Pick up over-the-counter to have on hand.  MiraLax is a solution that will increase the amount of water in your bowels to assist with bowel movements.  Take as directed and can mix with a glass of water, juice, soda, coffee, or tea.  Take if you go more than two days without a movement. °Do not use MiraLax more than once per day. Call your doctor if you are still constipated or irregular after using this medication for 7 days in a row. ° °If you continue to have problems with postoperative constipation, please contact the office for further assistance and recommendations.  If you experience "the worst abdominal pain ever" or develop nausea or vomiting, please contact the office immediatly for further recommendations for treatment. ° °ITCHING ° If you experience itching with your medications, try taking only a single pain pill, or even half a pain pill at a time.  You can also use Benadryl over the counter for itching or also to help with sleep.  ° °TED HOSE STOCKINGS °Wear the elastic stockings on both legs for three weeks following surgery during the day but you may remove then at night for sleeping. ° °MEDICATIONS °See your medication summary on the “After Visit Summary” that the nursing staff will review with you prior to discharge.  You may have some home medications which will be placed on hold until you complete the course of blood thinner medication.  It is important for you to complete the blood thinner medication as prescribed by your surgeon.  Continue your approved medications as instructed at time of discharge. ° °PRECAUTIONS °If you experience chest pain or shortness of breath - call 911 immediately for transfer to the hospital emergency department.  °If you develop a fever greater that 101 F, purulent drainage from wound, increased redness or  drainage from wound, foul odor from the wound/dressing, or calf pain - CONTACT YOUR SURGEON.   °                                                °FOLLOW-UP APPOINTMENTS °Make sure you keep all of your appointments after your operation with your surgeon and caregivers. You should call the office at the above phone number and make an appointment for approximately two weeks after the date of your surgery or on the date instructed by your surgeon outlined in the "After Visit Summary". ° °RANGE OF MOTION AND STRENGTHENING EXERCISES  °These exercises are designed to help you keep full movement of your hip joint. Follow your caregiver's or physical therapist's instructions. Perform all exercises about fifteen times, three times per day or as directed. Exercise both hips, even if you have had only one joint replacement. These exercises can be done on   a training (exercise) mat, on the floor, on a table or on a bed. Use whatever works the best and is most comfortable for you. Use music or television while you are exercising so that the exercises are a pleasant break in your day. This will make your life better with the exercises acting as a break in routine you can look forward to.   Lying on your back, slowly slide your foot toward your buttocks, raising your knee up off the floor. Then slowly slide your foot back down until your leg is straight again.   Lying on your back spread your legs as far apart as you can without causing discomfort.   Lying on your side, raise your upper leg and foot straight up from the floor as far as is comfortable. Slowly lower the leg and repeat.   Lying on your back, tighten up the muscle in the front of your thigh (quadriceps muscles). You can do this by keeping your leg straight and trying to raise your heel off the floor. This helps strengthen the largest muscle supporting your knee.   Lying on your back, tighten up the muscles of your buttocks both with the legs straight and with  the knee bent at a comfortable angle while keeping your heel on the floor.   IF YOU ARE TRANSFERRED TO A SKILLED REHAB FACILITY If the patient is transferred to a skilled rehab facility following release from the hospital, a list of the current medications will be sent to the facility for the patient to continue.  When discharged from the skilled rehab facility, please have the facility set up the patient's [place] prior to being released. Also, the skilled facility will be responsible for providing the patient with their medications at time of release from the facility to include their pain medication, the muscle relaxants, and their blood thinner medication. If the patient is still at the rehab facility at time of the two week follow up appointment, the skilled rehab facility will also need to assist the patient in arranging follow up appointment in our office and any transportation needs.  MAKE SURE YOU:   Understand these instructions.   Get help right away if you are not doing well or get worse.    Pick up stool softner and laxative for home use following surgery while on pain medications. Do not submerge incision under water. Please use good hand washing techniques while changing dressing each day. May shower starting three days after surgery. Please use a clean towel to pat the incision dry following showers. Continue to use ice for pain and swelling after surgery. Do not use any lotions or creams on the incision until instructed by your surgeon.  ______________________________________________________  Information on my medicine - XARELTO (Rivaroxaban)  This medication education was reviewed with me or my healthcare representative as part of my discharge preparation.    Why was Xarelto prescribed for you? Xarelto was prescribed for you to reduce the risk of blood clots forming after orthopedic surgery. The medical term for these abnormal blood clots is venous  thromboembolism (VTE).  What do you need to know about xarelto ? Take your Xarelto ONCE DAILY at the same time every day. You may take it either with or without food.  If you have difficulty swallowing the tablet whole, you may crush it and mix in applesauce just prior to taking your dose.  Take Xarelto exactly as prescribed by your doctor and DO NOT stop  taking Xarelto without talking to the doctor who prescribed the medication.  Stopping without other VTE prevention medication to take the place of Xarelto may increase your risk of developing a clot.  After discharge, you should have regular check-up appointments with your healthcare provider that is prescribing your Xarelto.    What do you do if you miss a dose? If you miss a dose, take it as soon as you remember on the same day then continue your regularly scheduled once daily regimen the next day. Do not take two doses of Xarelto on the same day.   Important Safety Information A possible side effect of Xarelto is bleeding. You should call your healthcare provider right away if you experience any of the following: ? Bleeding from an injury or your nose that does not stop. ? Unusual colored urine (red or dark brown) or unusual colored stools (red or black). ? Unusual bruising for unknown reasons. ? A serious fall or if you hit your head (even if there is no bleeding).  Some medicines may interact with Xarelto and might increase your risk of bleeding while on Xarelto. To help avoid this, consult your healthcare provider or pharmacist prior to using any new prescription or non-prescription medications, including herbals, vitamins, non-steroidal anti-inflammatory drugs (NSAIDs) and supplements.  This website has more information on Xarelto: [email or web address]

## 2018-07-05 NOTE — Interval H&P Note (Signed)
History and Physical Interval Note:  07/05/2018 6:46 AM  [name]  has presented today for surgery, with the diagnosis of left hip osteoarthritis.  The various methods of treatment have been discussed with the patient and family. After consideration of risks, benefits and other options for treatment, the patient has consented to  Procedure(s) with comments: [place] (Left) - 146min as a surgical intervention.  The patient's history has been reviewed, patient examined, no change in status, stable for surgery.  I have reviewed the patient's chart and labs.  Questions were answered to the patient's satisfaction.     Pilar Plate Oria Klimas

## 2018-07-05 NOTE — Op Note (Signed)
OPERATIVE REPORT- TOTAL HIP ARTHROPLASTY   PREOPERATIVE DIAGNOSIS: Osteoarthritis of the Left hip.   POSTOPERATIVE DIAGNOSIS: Osteoarthritis of the Left  hip.   PROCEDURE: Left total hip arthroplasty, anterior approach.   SURGEON: Gaynelle Arabian, MD   ASSISTANT: Griffith Citron, PA-C  ANESTHESIA:  Spinal  ESTIMATED BLOOD LOSS:-300 mL    DRAINS: Hemovac x1.   COMPLICATIONS: None   CONDITION: PACU - hemodynamically stable.   BRIEF CLINICAL NOTE: [name] is a 78 y.o. male who has advanced end-  stage arthritis of their Left  hip with progressively worsening pain and  dysfunction.The patient has failed nonoperative management and presents for  total hip arthroplasty.   PROCEDURE IN DETAIL: After successful administration of spinal  anesthetic, the traction boots for the [facility] bed were placed on both  feet and the patient was placed onto the [facility] bed, boots placed into the leg  holders. The Left hip was then isolated from the perineum with plastic  drapes and prepped and draped in the usual sterile fashion. ASIS and  greater trochanter were marked and a oblique incision was made, starting  at about 1 cm lateral and 2 cm distal to the ASIS and coursing towards  the anterior cortex of the femur. The skin was cut with a 10 blade  through subcutaneous tissue to the level of the fascia overlying the  tensor fascia lata muscle. The fascia was then incised in line with the  incision at the junction of the anterior third and posterior 2/3rd. The  muscle was teased off the fascia and then the interval between the TFL  and the rectus was developed. The Hohmann retractor was then placed at  the top of the femoral neck over the capsule. The vessels overlying the  capsule were cauterized and the fat on top of the capsule was removed.  A Hohmann retractor was then placed anterior underneath the rectus  femoris to give exposure to the entire anterior capsule. A T-shaped   capsulotomy was performed. The edges were tagged and the femoral head  was identified.       Osteophytes are removed off the superior acetabulum.  The femoral neck was then cut in situ with an oscillating saw. Traction  was then applied to the left lower extremity utilizing the [facility]  traction. The femoral head was then removed. Retractors were placed  around the acetabulum and then circumferential removal of the labrum was  performed. Osteophytes were also removed. Reaming starts at 49 mm to  medialize and  Increased in 2 mm increments to 53 mm. We reamed in  approximately 40 degrees of abduction, 20 degrees anteversion. A 54 mm  pinnacle acetabular shell was then impacted in anatomic position under  fluoroscopic guidance with excellent purchase. We did not need to place  any additional dome screws. A 36 mm neutral + 4 marathon liner was then  placed into the acetabular shell.       The femoral lift was then placed along the lateral aspect of the femur  just distal to the vastus ridge. The leg was  externally rotated and capsule  was stripped off the inferior aspect of the femoral neck down to the  level of the lesser trochanter, this was done with electrocautery. The femur was lifted after this was performed. The  leg was then placed in an extended and adducted position essentially delivering the femur. We also removed the capsule superiorly and the piriformis from the piriformis  fossa to gain excellent exposure of the  proximal femur. Rongeur was used to remove some cancellous bone to get  into the lateral portion of the proximal femur for placement of the  initial starter reamer. The starter broaches was placed  the starter broach  and was shown to go down the center of the canal. Broaching  with the Actis system was then performed starting at size 0  coursing  Up to size 6. A size 6 had excellent torsional and rotational  and axial stability. The trial high offset neck was then placed   with a 36 + 5 trial head. The hip was then reduced. We confirmed that  the stem was in the canal both on AP and lateral x-rays. It also has excellent sizing. The hip was reduced with outstanding stability through full extension and full external rotation.. AP pelvis was taken and the leg lengths were measured and found to be equal. Hip was then dislocated again and the femoral head and neck removed. The  femoral broach was removed. Size 6 Actis stem with a high offset  neck was then impacted into the femur following native anteversion. Has  excellent purchase in the canal. Excellent torsional and rotational and  axial stability. It is confirmed to be in the canal on AP and lateral  fluoroscopic views. The 36 + 5 metal head was placed and the hip  reduced with outstanding stability. Again AP pelvis was taken and it  confirmed that the leg lengths were equal. The wound was then copiously  irrigated with saline solution and the capsule reattached and repaired  with Ethibond suture. 30 ml of .25% Bupivicaine was  injected into the capsule and into the edge of the tensor fascia lata as well as subcutaneous tissue. The fascia overlying the tensor fascia lata was then closed with a running #1 V-Loc. Subcu was closed with interrupted 2-0 Vicryl and subcuticular running 4-0 Monocryl. Incision was cleaned  and dried. Steri-Strips and a bulky sterile dressing applied. Hemovac  drain was hooked to suction and then the patient was awakened and transported to  recovery in stable condition.        Please note that a surgical assistant was a medical necessity for this procedure to perform it in a safe and expeditious manner. Assistant was necessary to provide appropriate retraction of vital neurovascular structures and to prevent femoral fracture and allow for anatomic placement of the prosthesis.  Gaynelle Arabian, M.D.

## 2018-07-05 NOTE — Transfer of Care (Signed)
Immediate Anesthesia Transfer of Care Note  Patient: [name]  Procedure(s) Performed: TOTAL HIP ARTHROPLASTY ANTERIOR APPROACH (Left Hip)  Patient Location: PACU  Anesthesia Type:General  Level of Consciousness: awake, alert , oriented and patient cooperative  Airway & Oxygen Therapy: Patient Spontanous Breathing and Patient connected to face mask oxygen  Post-op Assessment: Report given to RN, Post -op Vital signs reviewed and stable and Patient moving all extremities  Post vital signs: Reviewed and stable  Last Vitals:  Vitals Value Taken Time  BP 162/91 07/05/18 0910  Temp    Pulse 102 07/05/18 0914  Resp 20 07/05/18 0914  SpO2 97 % 07/05/18 0914  Vitals shown include unvalidated device data.  Last Pain:  Vitals:   07/05/18 0611  TempSrc: Oral  PainSc:       Patients Stated Pain Goal: 9 (58/25/18 9842)  Complications: No apparent anesthesia complications

## 2018-07-05 NOTE — Anesthesia Procedure Notes (Signed)

## 2018-07-06 ENCOUNTER — Encounter (HOSPITAL_COMMUNITY): Payer: Self-pay | Admitting: Orthopedic Surgery

## 2018-07-06 LAB — BASIC METABOLIC PANEL
Anion gap: 7 (ref 5–15)
BUN: 17 mg/dL (ref 8–23)
CO2: 27 mmol/L (ref 22–32)
Calcium: 8.4 mg/dL — ABNORMAL LOW (ref 8.9–10.3)
Chloride: 103 mmol/L (ref 98–111)
Creatinine, Ser: 0.84 mg/dL (ref 0.61–1.24)
GFR calc Af Amer: >60 mL/min (ref >60)
GFR calc non Af Amer: >60 mL/min (ref >60)
Glucose, Bld: 228 mg/dL — ABNORMAL HIGH (ref 70–99)
Potassium: 4 mmol/L (ref 3.5–5.1)
Sodium: 137 mmol/L (ref 135–145)

## 2018-07-06 LAB — CBC
HCT: 38.7 % — ABNORMAL LOW (ref 39.0–52.0)
Hemoglobin: 12.1 g/dL — ABNORMAL LOW (ref 13.0–17.0)
MCH: 28.8 pg (ref 26.0–34.0)
MCHC: 31.3 g/dL (ref 30.0–36.0)
MCV: 92.1 fL (ref 80.0–100.0)
Platelets: 261 10*3/uL (ref 150–400)
RBC: 4.2 MIL/uL — ABNORMAL LOW (ref 4.22–5.81)
RDW: 14.1 % (ref 11.5–15.5)
WBC: 11.2 10*3/uL — ABNORMAL HIGH (ref 4.0–10.5)
nRBC: 0 % (ref 0.0–0.2)

## 2018-07-06 LAB — GLUCOSE, CAPILLARY
Glucose-Capillary: 162 mg/dL — ABNORMAL HIGH (ref 70–99)
Glucose-Capillary: 187 mg/dL — ABNORMAL HIGH (ref 70–99)

## 2018-07-06 MED ORDER — CYCLOBENZAPRINE HCL 10 MG PO TABS: 10.0000 mg | ORAL_TABLET | Freq: Three times a day (TID) | ORAL | 0 refills | Status: DC | PRN

## 2018-07-06 MED ORDER — TRAMADOL HCL 50 MG PO TABS: 50.0000 mg | ORAL_TABLET | Freq: Four times a day (QID) | ORAL | 0 refills | Status: DC | PRN

## 2018-07-06 MED ORDER — HYDROCODONE-ACETAMINOPHEN 5-325 MG PO TABS: 1.0000 | ORAL_TABLET | Freq: Four times a day (QID) | ORAL | 0 refills | Status: DC | PRN

## 2018-07-06 MED ORDER — RIVAROXABAN 10 MG PO TABS: 10.0000 mg | ORAL_TABLET | Freq: Every day | ORAL | 0 refills | Status: DC

## 2018-07-06 NOTE — Progress Notes (Signed)
Physical Therapy Treatment Patient Details Name: [name] MRN: [id number] DOB: 07-09-1940 Today's Date: 07/06/2018    History of Present Illness Pt s/p L THR and with hx of R THR, bil TKR and lumbar lami    PT Comments    POD # 1 am session (part 1) Assisted OOB.  Demonstrated and instructed pt how to use belt to self assist LE as a leg lifter Assisted with amb to bathroom to void.  Noted blood running down leg from incision.  Assisted back to bed and reported to RN.    Follow Up Recommendations  Follow surgeon's recommendation for DC plan and follow-up therapies(HEP)     Equipment Recommendations  None recommended by PT    Recommendations for Other Services       Precautions / Restrictions Precautions Precautions: Fall Precaution Comments: HOH Restrictions Weight Bearing Restrictions: No Other Position/Activity Restrictions: WBAT    Mobility  Bed Mobility Overal bed mobility: Needs Assistance Bed Mobility: Supine to Sit;Sit to Supine     Supine to sit: Min assist     General bed mobility comments: 75% VC's on proper tech and increased time.  demonstarted and instructed on how to use a belt to self assist LE off bed then assisted L LE back onto bed due to increased pain.  Transfers Overall transfer level: Needs assistance Equipment used: Rolling walker (2 wheeled) Transfers: Sit to/from [facility] Sit to Stand: Min assist Stand pivot transfers: Min assist;Mod assist       General transfer comment: 75% VC's on proper hand placement and turn completion to use walker throughout.  Hand placement with stand to sit and to controll desend.  Ambulation/Gait Ambulation/Gait assistance: Min assist Gait Distance (Feet): 18 Feet (to and from bathroom)  Assistive device: Rolling walker (2 wheeled) Gait Pattern/deviations: Step-to pattern;Decreased step length - right;Decreased step length - left;Shuffle;Trunk flexed Gait velocity: decr    General Gait Details: 50% VC's on proper walker use, proper walker to self distance and 755 VC's safety with turns.  Impulsive and impaired safety cognition.   Stairs             Wheelchair Mobility    Modified Rankin (Stroke Patients Only)       Balance                                            Cognition Arousal/Alertness: Awake/alert Behavior During Therapy: WFL for tasks assessed/performed Overall Cognitive Status: Within Functional Limits for tasks assessed                                 General Comments: honary requires repeat instruction esp in reguards to walker safety      Exercises      General Comments        Pertinent Vitals/Pain Pain Assessment: Faces Faces Pain Scale: Hurts little more Pain Location: L hip Pain Descriptors / Indicators: Aching;Sore;Tender Pain Intervention(s): Monitored during session;Premedicated before session;Repositioned;Ice applied    Home Living                      Prior Function            PT Goals (current goals can now be found in the care plan section) Progress towards PT goals: Progressing toward goals  Frequency    7X/week      PT Plan Current plan remains appropriate    Co-evaluation              AM-PAC PT "6 Clicks" Mobility   Outcome Measure  Help needed turning from your back to your side while in a flat bed without using bedrails?: A Little Help needed moving from lying on your back to sitting on the side of a flat bed without using bedrails?: A Little Help needed moving to and from a bed to a chair (including a wheelchair)?: A Little Help needed standing up from a chair using your arms (e.g., wheelchair or bedside chair)?: A Little Help needed to walk in hospital room?: A Lot Help needed climbing 3-5 steps with a railing? : A Lot 6 Click Score: 16    End of Session Equipment Utilized During Treatment: Gait belt Activity Tolerance: Patient  tolerated treatment well Patient left: in chair;with call bell/phone within reach;with chair alarm set Nurse Communication: Mobility status PT Visit Diagnosis: Muscle weakness (generalized) (M62.81);Difficulty in walking, not elsewhere classified (R26.2)     Time: 4492-0100 PT Time Calculation (min) (ACUTE ONLY): 18 min  Charges:  $Gait Training: [place]  PTA Acute  Rehabilitation Services Pager      [phone] Office      [phone]

## 2018-07-06 NOTE — Progress Notes (Signed)
   Subjective: 1 Day Post-Op Procedure(s) (LRB): TOTAL HIP ARTHROPLASTY ANTERIOR APPROACH (Left) Patient reports pain as mild.   Patient seen in rounds by Dr. Wynelle Link. Patient is well, and has had no acute complaints or problems other than discomfort in the left hip. Denies chest pain or SOB. Foley catheter removed this AM. No issues overnight.  We will continue therapy today.   Objective: Vital signs in last 24 hours: Temp:  [97.6 F (36.4 C)-98.8 F (37.1 C)] 97.7 F (36.5 C) (06/25 0552) Pulse Rate:  [59-104] 59 (06/25 0552) Resp:  [14-22] 18 (06/25 0552) BP: (104-166)/(54-95) 146/85 (06/25 0552) SpO2:  [93 %-100 %] 99 % (06/25 0552)  Intake/Output from previous day:  Intake/Output Summary (Last 24 hours) at 07/06/2018 0747 Last data filed at 07/06/2018 0643 Gross per 24 hour  Intake 4049.05 ml  Output 3170 ml  Net 879.05 ml    Labs: Recent Labs    07/06/18 0250  HGB 12.1*   Recent Labs    07/06/18 0250  WBC 11.2*  RBC 4.20*  HCT 38.7*  PLT 261   Recent Labs    07/06/18 0250  NA 137  K 4.0  CL 103  CO2 27  BUN 17  CREATININE 0.84  GLUCOSE 228*  CALCIUM 8.4*   Exam: General - Patient is Alert and Oriented Extremity - Neurologically intact Neurovascular intact Sensation intact distally Dorsiflexion/Plantar flexion intact Dressing - dressing C/D/I Motor Function - intact, moving foot and toes well on exam.   Past Medical History:  Diagnosis Date  . Anxiety disorder   . Aortic atherosclerosis ([place]) 12/19/2017  . Arthritis   . Bursitis of hip   . Cervical spondylosis   . Chronic headaches   . Coronary atherosclerosis of native coronary artery    BMS nondominant RCA 12/2004  . Essential hypertension   . GERD (gastroesophageal reflux disease)   . History of adenomatous polyp of colon   . History of kidney stones   . History of MI (myocardial infarction) 12/2004  . History of viral meningitis 02/28/2018   December 2019  . Hyperlipidemia   .  Internal hemorrhoids   . Low back pain   . Nocturia more than twice per night   . Numbness of left foot   . OSA (obstructive sleep apnea)   . Spinal stenosis   . Type 2 diabetes mellitus treated with insulin ([place])   . Urgency of urination   . Wears dentures    upper    Assessment/Plan: 1 Day Post-Op Procedure(s) (LRB): TOTAL HIP ARTHROPLASTY ANTERIOR APPROACH (Left) Principal Problem:   OA (osteoarthritis) of hip  Estimated body mass index is 36.96 kg/m as calculated from the following:   Height as of this encounter: 5\' 6"  (1.676 m).   Weight as of this encounter: 103.9 kg. Advance diet Up with therapy D/C IV fluids  DVT Prophylaxis - Xarelto Weight bearing as tolerated. D/C O2 and pulse ox and try on room air. Hemovac pulled without difficulty, will continue therapy.  Plan is to go Home after hospital stay. Plan for discharge with HEP once meeting goals with therapy today. Follow-up in the office in 2 weeks.   Theresa Duty, PA-C Orthopedic Surgery 07/06/2018, 7:47 AM

## 2018-07-06 NOTE — Progress Notes (Signed)
Physical Therapy Treatment Patient Details Name: [name] MRN: [id number] DOB: 08-12-1940 Today's Date: 07/06/2018    History of Present Illness Pt s/p L THR and with hx of R THR, bil TKR and lumbar lami    PT Comments    POD # 1 pm session.   Pt was amb self around room without walker trying to pack up.  Redirected and alerted staff as I was walking past room with another pt. Assisted with amb in hallway.  Practiced stairs. General stair comments: 100% VC's on proper sequencing and safety. Assisted back to recliner and applied ICE.  PT eager to go home.  Instructed on safety as pt has tendency to push walker to side and take a few steps to recliner without AD.  Addressed all mobility questions, discussed appropriate activity, educated on use of ICE.  Pt ready for D/C to home.    Follow Up Recommendations  Follow surgeon's recommendation for DC plan and follow-up therapies(HEP)     Equipment Recommendations  None recommended by PT    Recommendations for Other Services       Precautions / Restrictions Precautions Precautions: Fall Precaution Comments: HOH Restrictions Weight Bearing Restrictions: No Other Position/Activity Restrictions: WBAT    Mobility  Bed Mobility Overal bed mobility: Needs Assistance Bed Mobility: Supine to Sit;Sit to Supine     Supine to sit: Min assist     General bed mobility comments: OOB in recliner  Transfers Overall transfer level: Needs assistance Equipment used: Rolling walker (2 wheeled) Transfers: Sit to/from [facility] Sit to Stand: Min assist Stand pivot transfers: Min assist;Mod assist       General transfer comment: 75% VC's on proper hand placement and turn completion to use walker throughout.  Hand placement with stand to sit and to controll desend.  Ambulation/Gait Ambulation/Gait assistance: Min assist Gait Distance (Feet): 22 Feet Assistive device: Rolling walker (2 wheeled) Gait  Pattern/deviations: Step-to pattern;Decreased step length - right;Decreased step length - left;Shuffle;Trunk flexed Gait velocity: decr   General Gait Details: 50% VC's on proper walker use, proper walker to self distance and 755 VC's safety with turns.  Impulsive and impaired safety cognition.   Stairs Stairs: Yes Stairs assistance: Min assist Stair Management: Two rails;Forwards Number of Stairs: 2 General stair comments: 100% VC's on proper sequencing and safety.   Wheelchair Mobility    Modified Rankin (Stroke Patients Only)       Balance                                            Cognition Arousal/Alertness: Awake/alert Behavior During Therapy: WFL for tasks assessed/performed Overall Cognitive Status: Within Functional Limits for tasks assessed                                 General Comments: honary requires repeat instruction esp in reguards to walker safety      Exercises      General Comments        Pertinent Vitals/Pain Pain Assessment: Faces Faces Pain Scale: Hurts little more Pain Location: L hip Pain Descriptors / Indicators: Aching;Sore;Tender Pain Intervention(s): Monitored during session;Premedicated before session;Repositioned;Ice applied    Home Living  Prior Function            PT Goals (current goals can now be found in the care plan section) Progress towards PT goals: Progressing toward goals    Frequency    7X/week      PT Plan Current plan remains appropriate    Co-evaluation              AM-PAC PT "6 Clicks" Mobility   Outcome Measure  Help needed turning from your back to your side while in a flat bed without using bedrails?: A Little Help needed moving from lying on your back to sitting on the side of a flat bed without using bedrails?: A Little Help needed moving to and from a bed to a chair (including a wheelchair)?: A Little Help needed standing  up from a chair using your arms (e.g., wheelchair or bedside chair)?: A Little Help needed to walk in hospital room?: A Lot Help needed climbing 3-5 steps with a railing? : A Lot 6 Click Score: 16    End of Session Equipment Utilized During Treatment: Gait belt Activity Tolerance: Patient tolerated treatment well Patient left: in chair;with call bell/phone within reach;with chair alarm set Nurse Communication: Mobility status PT Visit Diagnosis: Muscle weakness (generalized) (M62.81);Difficulty in walking, not elsewhere classified (R26.2)     Time: 7628-3151 PT Time Calculation (min) (ACUTE ONLY): 26 min  Charges:  $Gait Training: 8-22 mins $Therapeutic Activity: 8-22 mins                     Rica Koyanagi  PTA Acute  Rehabilitation Services Pager      [phone] Office      [phone]

## 2018-07-06 NOTE — Progress Notes (Signed)
Physical Therapy Treatment Patient Details Name: [name] MRN: [id number] DOB: 1940-02-05 Today's Date: 07/06/2018    History of Present Illness Pt s/p L THR and with hx of R THR, bil TKR and lumbar lami    PT Comments    POD # 1 am session (part 2) Assisted OOB.  Demonstrated and instructed pt how to use belt to self assist LE as a leg lifter Assisted with amb in hallway.  General Gait Details: 50% VC's on proper walker use, proper walker to self distance and 755 VC's safety with turns.  Impulsive and impaired safety cognition. Then returned to room to perform some TE's following HEP handout.  Instructed on proper tech, freq as well as use of ICE.   Pt will meed another PT session to address stairs.     Follow Up Recommendations  Follow surgeon's recommendation for DC plan and follow-up therapies(HEP)     Equipment Recommendations  None recommended by PT    Recommendations for Other Services       Precautions / Restrictions Precautions Precautions: Fall Precaution Comments: HOH Restrictions Weight Bearing Restrictions: No Other Position/Activity Restrictions: WBAT    Mobility  Bed Mobility Overal bed mobility: Needs Assistance Bed Mobility: Supine to Sit     Supine to sit: Min assist     General bed mobility comments: 75% VC's on proper tech and increased time.  demonstarted and instructed on how to use a belt to self assist LE off bed.  Transfers Overall transfer level: Needs assistance Equipment used: Rolling walker (2 wheeled) Transfers: Sit to/from [facility] Sit to Stand: Min assist Stand pivot transfers: Min assist;Mod assist       General transfer comment: 75% VC's on proper hand placement and turn completion to use walker throughout.  Hand placement with stand to sit and to controll desend.  Ambulation/Gait Ambulation/Gait assistance: Min assist Gait Distance (Feet): 35 Feet Assistive device: Rolling walker (2  wheeled) Gait Pattern/deviations: Step-to pattern;Decreased step length - right;Decreased step length - left;Shuffle;Trunk flexed Gait velocity: decr   General Gait Details: 50% VC's on proper walker use, proper walker to self distance and 755 VC's safety with turns.  Impulsive and impaired safety cognition.   Stairs             Wheelchair Mobility    Modified Rankin (Stroke Patients Only)       Balance                                            Cognition Arousal/Alertness: Awake/alert Behavior During Therapy: WFL for tasks assessed/performed Overall Cognitive Status: Within Functional Limits for tasks assessed                                 General Comments: honary requires repeat instruction esp in reguards to walker safety      Exercises      General Comments        Pertinent Vitals/Pain Pain Assessment: Faces Faces Pain Scale: Hurts little more Pain Location: L hip Pain Descriptors / Indicators: Aching;Sore;Tender Pain Intervention(s): Monitored during session;Premedicated before session;Repositioned;Ice applied    Home Living                      Prior Function  PT Goals (current goals can now be found in the care plan section) Progress towards PT goals: Progressing toward goals    Frequency    7X/week      PT Plan Current plan remains appropriate    Co-evaluation              AM-PAC PT "6 Clicks" Mobility   Outcome Measure  Help needed turning from your back to your side while in a flat bed without using bedrails?: A Little Help needed moving from lying on your back to sitting on the side of a flat bed without using bedrails?: A Little Help needed moving to and from a bed to a chair (including a wheelchair)?: A Little Help needed standing up from a chair using your arms (e.g., wheelchair or bedside chair)?: A Little Help needed to walk in hospital room?: A Lot Help needed  climbing 3-5 steps with a railing? : A Lot 6 Click Score: 16    End of Session Equipment Utilized During Treatment: Gait belt Activity Tolerance: Patient tolerated treatment well Patient left: in chair;with call bell/phone within reach;with chair alarm set Nurse Communication: Mobility status PT Visit Diagnosis: Muscle weakness (generalized) (M62.81);Difficulty in walking, not elsewhere classified (R26.2)     Time: 6333-5456 PT Time Calculation (min) (ACUTE ONLY): 24 min  Charges:  $Gait Training: 8-22 mins $Therapeutic Exercise: 8-22 mins                     Rica Koyanagi  PTA Acute  Rehabilitation Services Pager      [phone] Office      [phone]

## 2018-07-10 NOTE — Discharge Summary (Signed)
Physician Discharge Summary   Patient ID: [name] MRN: [id number] DOB/AGE: 02/02/1940 78 y.o.  Admit date: 07/05/2018 Discharge date: 07/06/2018  Primary Diagnosis: Osteoarthritis, left hip   Admission Diagnoses:  Past Medical History:  Diagnosis Date  . Anxiety disorder   . Aortic atherosclerosis ([place]) 12/19/2017  . Arthritis   . Bursitis of hip   . Cervical spondylosis   . Chronic headaches   . Coronary atherosclerosis of native coronary artery    BMS nondominant RCA 12/2004  . Essential hypertension   . GERD (gastroesophageal reflux disease)   . History of adenomatous polyp of colon   . History of kidney stones   . History of MI (myocardial infarction) 12/2004  . History of viral meningitis 02/28/2018   December 2019  . Hyperlipidemia   . Internal hemorrhoids   . Low back pain   . Nocturia more than twice per night   . Numbness of left foot   . OSA (obstructive sleep apnea)   . Spinal stenosis   . Type 2 diabetes mellitus treated with insulin ([place])   . Urgency of urination   . Wears dentures    upper   Discharge Diagnoses:   Principal Problem:   OA (osteoarthritis) of hip  Estimated body mass index is 36.96 kg/m as calculated from the following:   Height as of this encounter: 5\' 6"  (1.676 m).   Weight as of this encounter: 103.9 kg.  Procedure:  Procedure(s) (LRB): TOTAL HIP ARTHROPLASTY ANTERIOR APPROACH (Left)   Consults: None  HPI: [name] is a 78 y.o. male who has advanced end-stage arthritis of their Left  hip with progressively worsening pain and dysfunction.The patient has failed nonoperative management and presents for total hip arthroplasty.   Laboratory Data: Admission on 07/05/2018, Discharged on 07/06/2018  Component Date Value Ref Range Status  . Glucose-Capillary 07/05/2018 223* 70 - 99 mg/dL Final  . Comment 1 07/05/2018 Document in Chart   Final  . Comment 2 07/05/2018 Call MD NNP PA CNM   Final  . Glucose-Capillary  07/05/2018 237* 70 - 99 mg/dL Final  . Comment 1 07/05/2018 Document in Chart   Final  . Comment 2 07/05/2018 Call MD NNP PA CNM   Final  . Glucose-Capillary 07/05/2018 244* 70 - 99 mg/dL Final  . Glucose-Capillary 07/05/2018 235* 70 - 99 mg/dL Final  . WBC 07/06/2018 11.2* 4.0 - 10.5 K/uL Final  . RBC 07/06/2018 4.20* 4.22 - 5.81 MIL/uL Final  . Hemoglobin 07/06/2018 12.1* 13.0 - 17.0 g/dL Final  . HCT 07/06/2018 38.7* 39.0 - 52.0 % Final  . MCV 07/06/2018 92.1  80.0 - 100.0 fL Final  . MCH 07/06/2018 28.8  26.0 - 34.0 pg Final  . MCHC 07/06/2018 31.3  30.0 - 36.0 g/dL Final  . RDW 07/06/2018 14.1  11.5 - 15.5 % Final  . Platelets 07/06/2018 261  150 - 400 K/uL Final  . nRBC 07/06/2018 0.0  0.0 - 0.2 % Final   Performed at [facility], [place] [address], [place], [place] [address]  . Sodium 07/06/2018 137  135 - 145 mmol/L Final  . Potassium 07/06/2018 4.0  3.5 - 5.1 mmol/L Final  . Chloride 07/06/2018 103  98 - 111 mmol/L Final  . CO2 07/06/2018 27  22 - 32 mmol/L Final  . Glucose, Bld 07/06/2018 228* 70 - 99 mg/dL Final  . BUN 07/06/2018 17  8 - 23 mg/dL Final  . Creatinine, Ser 07/06/2018 0.84  0.61 - 1.24 mg/dL Final  . Calcium 07/06/2018 8.4* 8.9 - 10.3 mg/dL Final  . GFR calc non Af Amer 07/06/2018 >60  >60 mL/min Final  . GFR calc Af Amer 07/06/2018 >60  >60 mL/min Final  . Anion gap 07/06/2018 7  5 - 15 Final   Performed at [facility], [place] [address], [place], [place] [address]  . Glucose-Capillary 07/05/2018 264* 70 - 99 mg/dL Final  . Glucose-Capillary 07/06/2018 162* 70 - 99 mg/dL Final  . Glucose-Capillary 07/06/2018 187* 70 - 99 mg/dL Final  Scanned Document on 07/05/2018  Component Date Value Ref Range Status  . HM Diabetic Eye Exam 06/14/2018 No Retinopathy  No Retinopathy Final   Warden Fillers, MD  Hospital Outpatient Visit on 07/01/2018  Component Date Value Ref Range Status  . SARS Coronavirus 2 07/01/2018 NEGATIVE   NEGATIVE Final   Comment: (NOTE) SARS-CoV-2 target nucleic acids are NOT DETECTED. The SARS-CoV-2 RNA is generally detectable in upper and lower respiratory specimens during the acute phase of infection. Negative results do not preclude SARS-CoV-2 infection, do not rule out co-infections with other pathogens, and should not be used as the sole basis for treatment or other patient management decisions. Negative results must be combined with clinical observations, patient history, and epidemiological information. The expected result is Negative. Fact Sheet for Patients: TrashEliminator.se Fact Sheet for Healthcare Providers: WhoisBlogging.ch This test is not yet approved or cleared by the [place] FDA and  has been authorized for detection and/or diagnosis of SARS-CoV-2 by FDA under an Emergency Use Authorization (EUA). This EUA will remain  in effect (meaning this test can be used) for the duration of the COVID-19 declaration under Section 56                          4(b)(1) of the Act, 21 U.S.C. section 360bbb-3(b)(1), unless the authorization is terminated or revoked sooner. Performed at [facility], [place] [address] [place], [place] [address]   Hospital Outpatient Visit on 06/30/2018  Component Date Value Ref Range Status  . Glucose-Capillary 06/30/2018 109* 70 - 99 mg/dL Final  . aPTT 06/30/2018 27  24 - 36 seconds Final   Performed at [facility], [place] [address], [place], [place] [address]  . WBC 06/30/2018 7.6  4.0 - 10.5 K/uL Final  . RBC 06/30/2018 4.69  4.22 - 5.81 MIL/uL Final  . Hemoglobin 06/30/2018 14.0  13.0 - 17.0 g/dL Final  . HCT 06/30/2018 43.2  39.0 - 52.0 % Final  . MCV 06/30/2018 92.1  80.0 - 100.0 fL Final  . MCH 06/30/2018 29.9  26.0 - 34.0 pg Final  . MCHC 06/30/2018 32.4  30.0 - 36.0 g/dL Final  . RDW 06/30/2018 14.1  11.5 - 15.5 % Final  . Platelets 06/30/2018 273  150 - 400  K/uL Final  . nRBC 06/30/2018 0.0  0.0 - 0.2 % Final   Performed at [facility], [place] [address], [place], [place] [address]  . Sodium 06/30/2018 138  135 - 145 mmol/L Final  . Potassium 06/30/2018 4.2  3.5 - 5.1 mmol/L Final  . Chloride 06/30/2018 103  98 - 111 mmol/L Final  . CO2 06/30/2018 25  22 - 32 mmol/L Final  . Glucose, Bld 06/30/2018 95  70 - 99 mg/dL Final  . BUN 06/30/2018 20  8 - 23 mg/dL Final  . Creatinine, Ser 06/30/2018 0.83  0.61 -  1.24 mg/dL Final  . Calcium 06/30/2018 9.4  8.9 - 10.3 mg/dL Final  . Total Protein 06/30/2018 7.6  6.5 - 8.1 g/dL Final  . Albumin 06/30/2018 3.9  3.5 - 5.0 g/dL Final  . AST 06/30/2018 21  15 - 41 U/L Final  . ALT 06/30/2018 21  0 - 44 U/L Final  . Alkaline Phosphatase 06/30/2018 69  38 - 126 U/L Final  . Total Bilirubin 06/30/2018 0.6  0.3 - 1.2 mg/dL Final  . GFR calc non Af Amer 06/30/2018 >60  >60 mL/min Final  . GFR calc Af Amer 06/30/2018 >60  >60 mL/min Final  . Anion gap 06/30/2018 10  5 - 15 Final   Performed at [facility], [place] [address], [place], [place] [address]  . Prothrombin Time 06/30/2018 11.9  11.4 - 15.2 seconds Final  . INR 06/30/2018 0.9  0.8 - 1.2 Final   Comment: (NOTE) INR goal varies based on device and disease states. Performed at [facility], [place] [address] [place], [place] [address]   . ABO/RH(D) 06/30/2018 O POS   Final  . Antibody Screen 06/30/2018 NEG   Final  . Sample Expiration 06/30/2018 07/08/2018,2359   Final  . Extend sample reason 06/30/2018    Final                   Value:NO TRANSFUSIONS OR PREGNANCY IN THE PAST 3 MONTHS Performed at [facility], [place] [address], [place], [place] [address]   . Hgb A1c MFr Bld 06/30/2018 7.2* 4.8 - 5.6 % Final   Comment: (NOTE) Pre diabetes:          5.7%-6.4% Diabetes:              >6.4% Glycemic control for   <7.0% adults with diabetes   . Mean Plasma Glucose 06/30/2018  159.94  mg/dL Final   Performed at [place] [address] [place], [place] [address]  . MRSA, PCR 06/30/2018 NEGATIVE  NEGATIVE Final  . Staphylococcus aureus 06/30/2018 NEGATIVE  NEGATIVE Final   Comment: (NOTE) The Xpert SA Assay (FDA approved for NASAL specimens in patients 50 years of age and older), is one component of a comprehensive surveillance program. It is not intended to diagnose infection nor to guide or monitor treatment. Performed at [facility], [place] [address], [place], [place] [address]   Office Visit on 05/15/2018  Component Date Value Ref Range Status  . HB A1C (BAYER DCA - WAIVED) 05/15/2018 7.1* <7.0 % Final   Comment:                                       Diabetic Adult            <7.0                                       Healthy Adult        4.3 - 5.7                                                           (  DCCT/NGSP) American Diabetes Association's Summary of Glycemic Recommendations for Adults with Diabetes: Hemoglobin A1c <7.0%. More stringent glycemic goals (A1c <6.0%) may further reduce complications at the cost of increased risk of hypoglycemia.   . WBC 05/15/2018 6.9  3.4 - 10.8 x10E3/uL Final  . RBC 05/15/2018 5.07  4.14 - 5.80 x10E6/uL Final  . Hemoglobin 05/15/2018 14.6  13.0 - 17.7 g/dL Final  . Hematocrit 05/15/2018 44.0  37.5 - 51.0 % Final  . MCV 05/15/2018 87  79 - 97 fL Final  . MCH 05/15/2018 28.8  26.6 - 33.0 pg Final  . MCHC 05/15/2018 33.2  31.5 - 35.7 g/dL Final  . RDW 05/15/2018 13.6  11.6 - 15.4 % Final  . Platelets 05/15/2018 292  150 - 450 x10E3/uL Final  . Neutrophils 05/15/2018 63  Not Estab. % Final  . Lymphs 05/15/2018 25  Not Estab. % Final  . Monocytes 05/15/2018 9  Not Estab. % Final  . Eos 05/15/2018 2  Not Estab. % Final  . Basos 05/15/2018 1  Not Estab. % Final  . Neutrophils Absolute 05/15/2018 4.3  1.4 - 7.0 x10E3/uL Final  . Lymphocytes Absolute 05/15/2018 1.8  0.7 - 3.1 x10E3/uL  Final  . Monocytes Absolute 05/15/2018 0.6  0.1 - 0.9 x10E3/uL Final  . EOS (ABSOLUTE) 05/15/2018 0.1  0.0 - 0.4 x10E3/uL Final  . Basophils Absolute 05/15/2018 0.1  0.0 - 0.2 x10E3/uL Final  . Immature Granulocytes 05/15/2018 0  Not Estab. % Final  . Immature Grans (Abs) 05/15/2018 0.0  0.0 - 0.1 x10E3/uL Final  . Glucose 05/15/2018 145* 65 - 99 mg/dL Final  . BUN 05/15/2018 13  8 - 27 mg/dL Final  . Creatinine, Ser 05/15/2018 0.89  0.76 - 1.27 mg/dL Final  . GFR calc non Af Amer 05/15/2018 82  >59 mL/min/1.73 Final  . GFR calc Af Amer 05/15/2018 95  >59 mL/min/1.73 Final  . BUN/Creatinine Ratio 05/15/2018 15  10 - 24 Final  . Sodium 05/15/2018 141  134 - 144 mmol/L Final  . Potassium 05/15/2018 4.6  3.5 - 5.2 mmol/L Final  . Chloride 05/15/2018 101  96 - 106 mmol/L Final  . CO2 05/15/2018 24  20 - 29 mmol/L Final  . Calcium 05/15/2018 9.6  8.6 - 10.2 mg/dL Final  . Total Protein 05/15/2018 7.1  6.0 - 8.5 g/dL Final  . Albumin 05/15/2018 4.1  3.7 - 4.7 g/dL Final  . Globulin, Total 05/15/2018 3.0  1.5 - 4.5 g/dL Final  . Albumin/Globulin Ratio 05/15/2018 1.4  1.2 - 2.2 Final  . Bilirubin Total 05/15/2018 0.4  0.0 - 1.2 mg/dL Final  . Alkaline Phosphatase 05/15/2018 75  39 - 117 IU/L Final  . AST 05/15/2018 18  0 - 40 IU/L Final  . ALT 05/15/2018 21  0 - 44 IU/L Final  . Cholesterol, Total 05/15/2018 126  100 - 199 mg/dL Final  . Triglycerides 05/15/2018 148  0 - 149 mg/dL Final  . HDL 05/15/2018 35* >39 mg/dL Final  . VLDL Cholesterol Cal 05/15/2018 30  5 - 40 mg/dL Final  . LDL Calculated 05/15/2018 61  0 - 99 mg/dL Final  . Chol/HDL Ratio 05/15/2018 3.6  0.0 - 5.0 ratio Final   Comment:                                   T. Chol/HDL Ratio  Men  Women                               1/2 Avg.Risk  3.4    3.3                                   Avg.Risk  5.0    4.4                                2X Avg.Risk  9.6    7.1                                 3X Avg.Risk 23.4   11.0      X-Rays:Dg Pelvis Portable  Result Date: 07/05/2018 CLINICAL DATA:  Post-op for let hip replacement EXAM: PORTABLE PELVIS 1-2 VIEWS COMPARISON:  Intraoperative images from earlier the same day FINDINGS: New left hip arthroplasty components project in expected location. No fracture or dislocation. Stable appearance of right hip arthroplasty components. IMPRESSION: Left hip arthroplasty without apparent complication. Electronically Signed   By: Lucrezia Europe M.D.   On: 07/05/2018 12:48   Dg C-arm 1-60 Min-no Report  Result Date: 07/05/2018 Fluoroscopy was utilized by the requesting physician.  No radiographic interpretation.   Dg Hip Operative Unilat W Or W/o Pelvis Left  Result Date: 07/05/2018 CLINICAL DATA:  LEFT anterior hip replacement EXAM: OPERATIVE LEFT HIP (WITH PELVIS IF PERFORMED) 4 VIEWS TECHNIQUE: Fluoroscopic spot image(s) were submitted for interpretation post-operatively. COMPARISON:  None FLUOROSCOPY TIME:  0 minutes 16.6 seconds Dose: 4.16 mGy FINDINGS: Images demonstrate placement of a LEFT hip prosthesis. No fracture dislocation seen. Sequela of prior RIGHT hip arthroplasty noted. IMPRESSION: LEFT hip prosthesis without acute complication. Electronically Signed   By: Lavonia Dana M.D.   On: 07/05/2018 11:45    EKG: Orders placed or performed during the hospital encounter of 12/18/17  . EKG 12-Lead  . EKG 12-Lead  . EKG     Hospital Course: [name] TRICKETT is a 78 y.o. who was admitted to [facility]. They were brought to the operating room on 07/05/2018 and underwent Procedure(s): [place].  Patient tolerated the procedure well and was later transferred to the recovery room and then to the orthopaedic floor for postoperative care. They were given PO and IV analgesics for pain control following their surgery. They were given 24 hours of postoperative antibiotics of  Anti-infectives (From admission,  onward)   Start     Dose/Rate Route Frequency Ordered Stop   07/05/18 1400  ceFAZolin (ANCEF) IVPB 2g/100 mL premix     2 g 200 mL/hr over 30 Minutes Intravenous Every 6 hours 07/05/18 1051 07/05/18 2120   07/05/18 0600  ceFAZolin (ANCEF) IVPB 2g/100 mL premix     2 g 200 mL/hr over 30 Minutes Intravenous On call to O.R. 07/05/18 0540 07/05/18 0739     and started on DVT prophylaxis in the form of Xarelto.   PT and OT were ordered for total joint protocol. Discharge planning consulted to help with postop disposition and equipment needs.  Patient had a good night on the evening of surgery. They started to get up OOB with therapy on POD #0. Pt was seen during  rounds and was ready to go home pending progress with therapy. Hemovac drain was pulled without difficulty. He worked with therapy on POD #1 and was meeting his goals. Pt was discharged to home later that day in stable condition.  Diet: Diabetic diet Activity: WBAT Follow-up: in 2 weeks Disposition: Home with HEP Discharged Condition: stable   Discharge Instructions    Call MD / Call 911   Complete by: As directed    If you experience chest pain or shortness of breath, CALL 911 and be transported to the hospital emergency room.  If you develope a fever above 101 F, pus (white drainage) or increased drainage or redness at the wound, or calf pain, call your surgeon's office.   Change dressing   Complete by: As directed    You may change your dressing on Friday, then change the dressing daily with sterile 4 x 4 inch gauze dressing and paper tape.   Constipation Prevention   Complete by: As directed    Drink plenty of fluids.  Prune juice may be helpful.  You may use a stool softener, such as Colace (over the counter) 100 mg twice a day.  Use MiraLax (over the counter) for constipation as needed.   Diet - low sodium heart healthy   Complete by: As directed    Discharge instructions   Complete by: As directed    Dr. Gaynelle [name] Total  Joint Specialist [facility] [address], [place], [place] [address] [phone]  ANTERIOR APPROACH TOTAL HIP REPLACEMENT POSTOPERATIVE DIRECTIONS   Hip Rehabilitation, Guidelines Following Surgery  The results of a hip operation are greatly improved after range of motion and muscle strengthening exercises. Follow all safety measures which are given to protect your hip. If any of these exercises cause increased pain or swelling in your joint, decrease the amount until you are comfortable again. Then slowly increase the exercises. Call your caregiver if you have problems or questions.   HOME CARE INSTRUCTIONS  Remove items at home which could result in a fall. This includes throw rugs or furniture in walking pathways.  ICE to the affected hip every three hours for 30 minutes at a time and then as needed for pain and swelling.  Continue to use ice on the hip for pain and swelling from surgery. You may notice swelling that will progress down to the foot and ankle.  This is normal after surgery.  Elevate the leg when you are not up walking on it.   Continue to use the breathing machine which will help keep your temperature down.  It is common for your temperature to cycle up and down following surgery, especially at night when you are not up moving around and exerting yourself.  The breathing machine keeps your lungs expanded and your temperature down.  DIET You may resume your previous home diet once your are discharged from the hospital.  DRESSING / WOUND CARE / SHOWERING You may change your dressing 3-5 days after surgery.  Then change the dressing every day with sterile gauze.  Please use good hand washing techniques before changing the dressing.  Do not use any lotions or creams on the incision until instructed by your surgeon. You may start showering once you are discharged home but do not submerge the incision under water. Just pat the incision dry and apply a dry gauze  dressing on daily. Change the surgical dressing daily and reapply a dry dressing each time.  ACTIVITY Walk with  your walker as instructed. Use walker as long as suggested by your caregivers. Avoid periods of inactivity such as sitting longer than an hour when not asleep. This helps prevent blood clots.  You may resume a sexual relationship in one month or when given the OK by your doctor.  You may return to work once you are cleared by your doctor.  Do not drive a car for 6 weeks or until released by you surgeon.  Do not drive while taking narcotics.  WEIGHT BEARING Weight bearing as tolerated with assist device (walker, cane, etc) as directed, use it as long as suggested by your surgeon or therapist, typically at least 4-6 weeks.  POSTOPERATIVE CONSTIPATION PROTOCOL Constipation - defined medically as fewer than three stools per week and severe constipation as less than one stool per week.  One of the most common issues patients have following surgery is constipation.  Even if you have a regular bowel pattern at home, your normal regimen is likely to be disrupted due to multiple reasons following surgery.  Combination of anesthesia, postoperative narcotics, change in appetite and fluid intake all can affect your bowels.  In order to avoid complications following surgery, here are some recommendations in order to help you during your recovery period.  Colace (docusate) - Pick up an over-the-counter form of Colace or another stool softener and take twice a day as long as you are requiring postoperative pain medications.  Take with a full glass of water daily.  If you experience loose stools or diarrhea, hold the colace until you stool forms back up.  If your symptoms do not get better within 1 week or if they get worse, check with your doctor.  Dulcolax (bisacodyl) - Pick up over-the-counter and take as directed by the product packaging as needed to assist with the movement of your bowels.  Take  with a full glass of water.  Use this product as needed if not relieved by Colace only.   MiraLax (polyethylene glycol) - Pick up over-the-counter to have on hand.  MiraLax is a solution that will increase the amount of water in your bowels to assist with bowel movements.  Take as directed and can mix with a glass of water, juice, soda, coffee, or tea.  Take if you go more than two days without a movement. Do not use MiraLax more than once per day. Call your doctor if you are still constipated or irregular after using this medication for 7 days in a row.  If you continue to have problems with postoperative constipation, please contact the office for further assistance and recommendations.  If you experience "the worst abdominal pain ever" or develop nausea or vomiting, please contact the office immediatly for further recommendations for treatment.  ITCHING  If you experience itching with your medications, try taking only a single pain pill, or even half a pain pill at a time.  You can also use Benadryl over the counter for itching or also to help with sleep.   TED HOSE STOCKINGS Wear the elastic stockings on both legs for three weeks following surgery during the day but you may remove then at night for sleeping.  MEDICATIONS See your medication summary on the "After Visit Summary" that the nursing staff will review with you prior to discharge.  You may have some home medications which will be placed on hold until you complete the course of blood thinner medication.  It is important for you to complete the blood thinner medication  as prescribed by your surgeon.  Continue your approved medications as instructed at time of discharge.  PRECAUTIONS If you experience chest pain or shortness of breath - call 911 immediately for transfer to the hospital emergency department.  If you develop a fever greater that 101 F, purulent drainage from wound, increased redness or drainage from wound, foul odor from  the wound/dressing, or calf pain - CONTACT YOUR SURGEON.                                                   FOLLOW-UP APPOINTMENTS Make sure you keep all of your appointments after your operation with your surgeon and caregivers. You should call the office at the above phone number and make an appointment for approximately two weeks after the date of your surgery or on the date instructed by your surgeon outlined in the "After Visit Summary".  RANGE OF MOTION AND STRENGTHENING EXERCISES  These exercises are designed to help you keep full movement of your hip joint. Follow your caregiver's or physical therapist's instructions. Perform all exercises about fifteen times, three times per day or as directed. Exercise both hips, even if you have had only one joint replacement. These exercises can be done on a training (exercise) mat, on the floor, on a table or on a bed. Use whatever works the best and is most comfortable for you. Use music or television while you are exercising so that the exercises are a pleasant break in your day. This will make your life better with the exercises acting as a break in routine you can look forward to.  Lying on your back, slowly slide your foot toward your buttocks, raising your knee up off the floor. Then slowly slide your foot back down until your leg is straight again.  Lying on your back spread your legs as far apart as you can without causing discomfort.  Lying on your side, raise your upper leg and foot straight up from the floor as far as is comfortable. Slowly lower the leg and repeat.  Lying on your back, tighten up the muscle in the front of your thigh (quadriceps muscles). You can do this by keeping your leg straight and trying to raise your heel off the floor. This helps strengthen the largest muscle supporting your knee.  Lying on your back, tighten up the muscles of your buttocks both with the legs straight and with the knee bent at a comfortable angle while  keeping your heel on the floor.   IF YOU ARE TRANSFERRED TO A SKILLED REHAB FACILITY If the patient is transferred to a skilled rehab facility following release from the hospital, a list of the current medications will be sent to the facility for the patient to continue.  When discharged from the skilled rehab facility, please have the facility set up the patient's [place] prior to being released. Also, the skilled facility will be responsible for providing the patient with their medications at time of release from the facility to include their pain medication, the muscle relaxants, and their blood thinner medication. If the patient is still at the rehab facility at time of the two week follow up appointment, the skilled rehab facility will also need to assist the patient in arranging follow up appointment in our office and any transportation needs.  MAKE SURE  YOU:  Understand these instructions.  Get help right away if you are not doing well or get worse.    Pick up stool softner and laxative for home use following surgery while on pain medications. Do not submerge incision under water. Please use good hand washing techniques while changing dressing each day. May shower starting three days after surgery. Please use a clean towel to pat the incision dry following showers. Continue to use ice for pain and swelling after surgery. Do not use any lotions or creams on the incision until instructed by your surgeon.   Do not sit on low chairs, stoools or toilet seats, as it may be difficult to get up from low surfaces   Complete by: As directed    Driving restrictions   Complete by: As directed    No driving for two weeks   TED hose   Complete by: As directed    Use stockings (TED hose) for three weeks on both leg(s).  You may remove them at night for sleeping.   Weight bearing as tolerated   Complete by: As directed      Allergies as of 07/06/2018      Reactions   Ace  Inhibitors Hives, Swelling, Rash   Rash,hives,tongue swelling   Angiotensin Receptor Blockers    Unknown reaction    Ciprofloxacin Other (See Comments)   confusion   Oxycodone-acetaminophen Other (See Comments)   Unknown reaction   Robaxin [methocarbamol] Other (See Comments)   Confusion    Toradol [ketorolac Tromethamine] Other (See Comments)   confusion   Valium Other (See Comments)   Hallucinations; confusion   Doxycycline Hives, Rash      Medication List    STOP taking these medications   aspirin EC 81 MG tablet   vitamin B-12 1000 MCG tablet Commonly known as: CYANOCOBALAMIN   Vitamin D3 50 MCG (2000 UT) Tabs     TAKE these medications   acetaminophen 650 MG CR tablet Commonly known as: TYLENOL Take 1,300 mg by mouth every 8 (eight) hours as needed for pain.   amLODipine 5 MG tablet Commonly known as: NORVASC Take 5 mg by mouth daily.   atorvastatin 20 MG tablet Commonly known as: LIPITOR TAKE 1 TABLET BY MOUTH  DAILY AT 6 PM. What changed: See the new instructions.   butalbital-acetaminophen-caffeine 50-325-40 MG tablet Commonly known as: FIORICET Take 1 tablet by mouth 2 (two) times daily as needed for headache.   cetirizine 10 MG tablet Commonly known as: ZYRTEC Take 10 mg by mouth every evening.   cyclobenzaprine 10 MG tablet Commonly known as: FLEXERIL Take 1 tablet (10 mg total) by mouth 3 (three) times daily as needed for muscle spasms.   famotidine 20 MG tablet Commonly known as: PEPCID TAKE 1 TABLET BY MOUTH TWO  TIMES DAILY What changed: when to take this   HumaLOG KwikPen 100 UNIT/ML KwikPen Generic drug: insulin lispro INJECT 0 TO 15 UNITS  SUBCUTANEOUSLY 3 TIMES  DAILY BEFORE MEALS  (70-150=8 UNITS, 151-200=9  UNITS) What changed: See the new instructions.   HYDROcodone-acetaminophen 5-325 MG tablet Commonly known as: NORCO/VICODIN Take 1-2 tablets by mouth every 6 (six) hours as needed for severe pain.   ketorolac 0.4 % Soln  Commonly known as: ACULAR Place 1 drop into the left eye 4 (four) times daily.   Lantus SoloStar 100 UNIT/ML Solostar Pen Generic drug: Insulin Glargine INJECT SUBCUTANEOUSLY 52  UNITS AT BEDTIME What changed: See the new instructions.   losartan  50 MG tablet Commonly known as: COZAAR TAKE 1 TABLET BY MOUTH  DAILY   metFORMIN 500 MG 24 hr tablet Commonly known as: GLUCOPHAGE-XR TAKE 2 TABLETS BY MOUTH TWO TIMES DAILY What changed: See the new instructions.   nitroGLYCERIN 0.4 MG SL tablet Commonly known as: NITROSTAT DISSOLVE ONE TABLET UNDER THE TONGUE EVERY 5 MINUTES AS NEEDED FOR CHEST PAIN.  DO NOT EXCEED A TOTAL OF 3 DOSES IN 15 MINUTES What changed:   how much to take  how to take this  when to take this  reasons to take this   ONE TOUCH ULTRA TEST test strip Generic drug: glucose blood USE 1 STRIP TO CHECK GLUCOSE 4 TIMES DAILY   PARoxetine 20 MG tablet Commonly known as: PAXIL Take 0.5 tablets (10 mg total) by mouth daily. What changed:   how much to take  when to take this   polyethylene glycol 17 g packet Commonly known as: MIRALAX / GLYCOLAX Take 17 g by mouth daily. What changed:   when to take this  reasons to take this   polyvinyl alcohol 1.4 % ophthalmic solution Commonly known as: LIQUIFILM TEARS Place 1 drop into both eyes as needed for dry eyes.   prednisoLONE acetate 1 % ophthalmic suspension Commonly known as: PRED FORTE Place 1 drop into the left eye 4 (four) times daily.   rivaroxaban 10 MG Tabs tablet Commonly known as: XARELTO Take 1 tablet (10 mg total) by mouth daily with breakfast for 20 days. Then resume one 81 mg aspirin once a day.   tamsulosin 0.4 MG Caps capsule Commonly known as: FLOMAX TAKE 1 CAPSULE BY MOUTH  DAILY What changed: when to take this   tobramycin 0.3 % ophthalmic solution Commonly known as: TOBREX Place 1 drop into the left eye 4 (four) times daily.   traMADol 50 MG tablet Commonly known as:  ULTRAM Take 1-2 tablets (50-100 mg total) by mouth every 6 (six) hours as needed for moderate pain.            Discharge Care Instructions  (From admission, onward)         Start     Ordered   07/06/18 0000  Weight bearing as tolerated     07/06/18 0750   07/06/18 0000  Change dressing    Comments: You may change your dressing on Friday, then change the dressing daily with sterile 4 x 4 inch gauze dressing and paper tape.   07/06/18 0750         Follow-up Information    [name] Arabian, MD. Schedule an appointment as soon as possible for a visit on 07/20/2018.   Specialty: Orthopedic Surgery Contact information: [address] [place] [address] [phone]           Signed: Theresa Duty, PA-C Orthopedic Surgery 07/10/2018, 7:57 AM

## 2018-07-13 DIAGNOSIS — E785 Hyperlipidemia, unspecified: Secondary | ICD-10-CM | POA: Diagnosis not present

## 2018-07-13 DIAGNOSIS — Z794 Long term (current) use of insulin: Secondary | ICD-10-CM | POA: Diagnosis not present

## 2018-07-13 DIAGNOSIS — M47892 Other spondylosis, cervical region: Secondary | ICD-10-CM | POA: Diagnosis not present

## 2018-07-13 DIAGNOSIS — Z96642 Presence of left artificial hip joint: Secondary | ICD-10-CM | POA: Diagnosis not present

## 2018-07-13 DIAGNOSIS — G43909 Migraine, unspecified, not intractable, without status migrainosus: Secondary | ICD-10-CM | POA: Diagnosis not present

## 2018-07-13 DIAGNOSIS — Z471 Aftercare following joint replacement surgery: Secondary | ICD-10-CM | POA: Diagnosis not present

## 2018-07-13 DIAGNOSIS — Z87891 Personal history of nicotine dependence: Secondary | ICD-10-CM | POA: Diagnosis not present

## 2018-07-13 DIAGNOSIS — M48061 Spinal stenosis, lumbar region without neurogenic claudication: Secondary | ICD-10-CM | POA: Diagnosis not present

## 2018-07-13 DIAGNOSIS — M545 Low back pain: Secondary | ICD-10-CM | POA: Diagnosis not present

## 2018-07-13 DIAGNOSIS — E1169 Type 2 diabetes mellitus with other specified complication: Secondary | ICD-10-CM | POA: Diagnosis not present

## 2018-07-13 DIAGNOSIS — K219 Gastro-esophageal reflux disease without esophagitis: Secondary | ICD-10-CM | POA: Diagnosis not present

## 2018-07-13 DIAGNOSIS — I10 Essential (primary) hypertension: Secondary | ICD-10-CM | POA: Diagnosis not present

## 2018-07-13 DIAGNOSIS — Z7952 Long term (current) use of systemic steroids: Secondary | ICD-10-CM | POA: Diagnosis not present

## 2018-07-13 DIAGNOSIS — E1121 Type 2 diabetes mellitus with diabetic nephropathy: Secondary | ICD-10-CM | POA: Diagnosis not present

## 2018-07-13 DIAGNOSIS — M7060 Trochanteric bursitis, unspecified hip: Secondary | ICD-10-CM | POA: Diagnosis not present

## 2018-07-13 DIAGNOSIS — I2510 Atherosclerotic heart disease of native coronary artery without angina pectoris: Secondary | ICD-10-CM | POA: Diagnosis not present

## 2018-07-13 DIAGNOSIS — M5416 Radiculopathy, lumbar region: Secondary | ICD-10-CM | POA: Diagnosis not present

## 2018-07-13 DIAGNOSIS — Z7982 Long term (current) use of aspirin: Secondary | ICD-10-CM | POA: Diagnosis not present

## 2018-07-19 DIAGNOSIS — Z87891 Personal history of nicotine dependence: Secondary | ICD-10-CM | POA: Diagnosis not present

## 2018-07-19 DIAGNOSIS — M545 Low back pain: Secondary | ICD-10-CM | POA: Diagnosis not present

## 2018-07-19 DIAGNOSIS — K219 Gastro-esophageal reflux disease without esophagitis: Secondary | ICD-10-CM | POA: Diagnosis not present

## 2018-07-19 DIAGNOSIS — M5416 Radiculopathy, lumbar region: Secondary | ICD-10-CM | POA: Diagnosis not present

## 2018-07-19 DIAGNOSIS — I2510 Atherosclerotic heart disease of native coronary artery without angina pectoris: Secondary | ICD-10-CM | POA: Diagnosis not present

## 2018-07-19 DIAGNOSIS — Z7982 Long term (current) use of aspirin: Secondary | ICD-10-CM | POA: Diagnosis not present

## 2018-07-19 DIAGNOSIS — E785 Hyperlipidemia, unspecified: Secondary | ICD-10-CM | POA: Diagnosis not present

## 2018-07-19 DIAGNOSIS — M48061 Spinal stenosis, lumbar region without neurogenic claudication: Secondary | ICD-10-CM | POA: Diagnosis not present

## 2018-07-19 DIAGNOSIS — Z7952 Long term (current) use of systemic steroids: Secondary | ICD-10-CM | POA: Diagnosis not present

## 2018-07-19 DIAGNOSIS — E1169 Type 2 diabetes mellitus with other specified complication: Secondary | ICD-10-CM | POA: Diagnosis not present

## 2018-07-19 DIAGNOSIS — E1121 Type 2 diabetes mellitus with diabetic nephropathy: Secondary | ICD-10-CM | POA: Diagnosis not present

## 2018-07-19 DIAGNOSIS — M7060 Trochanteric bursitis, unspecified hip: Secondary | ICD-10-CM | POA: Diagnosis not present

## 2018-07-19 DIAGNOSIS — Z794 Long term (current) use of insulin: Secondary | ICD-10-CM | POA: Diagnosis not present

## 2018-07-19 DIAGNOSIS — G43909 Migraine, unspecified, not intractable, without status migrainosus: Secondary | ICD-10-CM | POA: Diagnosis not present

## 2018-07-19 DIAGNOSIS — Z471 Aftercare following joint replacement surgery: Secondary | ICD-10-CM | POA: Diagnosis not present

## 2018-07-19 DIAGNOSIS — I10 Essential (primary) hypertension: Secondary | ICD-10-CM | POA: Diagnosis not present

## 2018-07-19 DIAGNOSIS — M47892 Other spondylosis, cervical region: Secondary | ICD-10-CM | POA: Diagnosis not present

## 2018-07-19 DIAGNOSIS — Z96642 Presence of left artificial hip joint: Secondary | ICD-10-CM | POA: Diagnosis not present

## 2018-07-21 DIAGNOSIS — M47892 Other spondylosis, cervical region: Secondary | ICD-10-CM | POA: Diagnosis not present

## 2018-07-21 DIAGNOSIS — K219 Gastro-esophageal reflux disease without esophagitis: Secondary | ICD-10-CM | POA: Diagnosis not present

## 2018-07-21 DIAGNOSIS — G43909 Migraine, unspecified, not intractable, without status migrainosus: Secondary | ICD-10-CM | POA: Diagnosis not present

## 2018-07-21 DIAGNOSIS — Z87891 Personal history of nicotine dependence: Secondary | ICD-10-CM | POA: Diagnosis not present

## 2018-07-21 DIAGNOSIS — R3915 Urgency of urination: Secondary | ICD-10-CM | POA: Diagnosis not present

## 2018-07-21 DIAGNOSIS — M5416 Radiculopathy, lumbar region: Secondary | ICD-10-CM | POA: Diagnosis not present

## 2018-07-21 DIAGNOSIS — Z471 Aftercare following joint replacement surgery: Secondary | ICD-10-CM | POA: Diagnosis not present

## 2018-07-21 DIAGNOSIS — M545 Low back pain: Secondary | ICD-10-CM | POA: Diagnosis not present

## 2018-07-21 DIAGNOSIS — Z7982 Long term (current) use of aspirin: Secondary | ICD-10-CM | POA: Diagnosis not present

## 2018-07-21 DIAGNOSIS — M48061 Spinal stenosis, lumbar region without neurogenic claudication: Secondary | ICD-10-CM | POA: Diagnosis not present

## 2018-07-21 DIAGNOSIS — M7060 Trochanteric bursitis, unspecified hip: Secondary | ICD-10-CM | POA: Diagnosis not present

## 2018-07-21 DIAGNOSIS — E1169 Type 2 diabetes mellitus with other specified complication: Secondary | ICD-10-CM | POA: Diagnosis not present

## 2018-07-21 DIAGNOSIS — Z96642 Presence of left artificial hip joint: Secondary | ICD-10-CM | POA: Diagnosis not present

## 2018-07-21 DIAGNOSIS — Z794 Long term (current) use of insulin: Secondary | ICD-10-CM | POA: Diagnosis not present

## 2018-07-21 DIAGNOSIS — I10 Essential (primary) hypertension: Secondary | ICD-10-CM | POA: Diagnosis not present

## 2018-07-21 DIAGNOSIS — R350 Frequency of micturition: Secondary | ICD-10-CM | POA: Diagnosis not present

## 2018-07-21 DIAGNOSIS — Z7952 Long term (current) use of systemic steroids: Secondary | ICD-10-CM | POA: Diagnosis not present

## 2018-07-21 DIAGNOSIS — E1121 Type 2 diabetes mellitus with diabetic nephropathy: Secondary | ICD-10-CM | POA: Diagnosis not present

## 2018-07-21 DIAGNOSIS — I2510 Atherosclerotic heart disease of native coronary artery without angina pectoris: Secondary | ICD-10-CM | POA: Diagnosis not present

## 2018-07-21 DIAGNOSIS — E785 Hyperlipidemia, unspecified: Secondary | ICD-10-CM | POA: Diagnosis not present

## 2018-07-22 DIAGNOSIS — G44009 Cluster headache syndrome, unspecified, not intractable: Secondary | ICD-10-CM | POA: Diagnosis not present

## 2018-07-22 DIAGNOSIS — G4733 Obstructive sleep apnea (adult) (pediatric): Secondary | ICD-10-CM | POA: Diagnosis not present

## 2018-07-25 DIAGNOSIS — Z7982 Long term (current) use of aspirin: Secondary | ICD-10-CM | POA: Diagnosis not present

## 2018-07-25 DIAGNOSIS — E1121 Type 2 diabetes mellitus with diabetic nephropathy: Secondary | ICD-10-CM | POA: Diagnosis not present

## 2018-07-25 DIAGNOSIS — I10 Essential (primary) hypertension: Secondary | ICD-10-CM | POA: Diagnosis not present

## 2018-07-25 DIAGNOSIS — Z87891 Personal history of nicotine dependence: Secondary | ICD-10-CM | POA: Diagnosis not present

## 2018-07-25 DIAGNOSIS — M5416 Radiculopathy, lumbar region: Secondary | ICD-10-CM | POA: Diagnosis not present

## 2018-07-25 DIAGNOSIS — Z794 Long term (current) use of insulin: Secondary | ICD-10-CM | POA: Diagnosis not present

## 2018-07-25 DIAGNOSIS — Z471 Aftercare following joint replacement surgery: Secondary | ICD-10-CM | POA: Diagnosis not present

## 2018-07-25 DIAGNOSIS — M545 Low back pain: Secondary | ICD-10-CM | POA: Diagnosis not present

## 2018-07-25 DIAGNOSIS — G43909 Migraine, unspecified, not intractable, without status migrainosus: Secondary | ICD-10-CM | POA: Diagnosis not present

## 2018-07-25 DIAGNOSIS — E785 Hyperlipidemia, unspecified: Secondary | ICD-10-CM | POA: Diagnosis not present

## 2018-07-25 DIAGNOSIS — M48061 Spinal stenosis, lumbar region without neurogenic claudication: Secondary | ICD-10-CM | POA: Diagnosis not present

## 2018-07-25 DIAGNOSIS — E1169 Type 2 diabetes mellitus with other specified complication: Secondary | ICD-10-CM | POA: Diagnosis not present

## 2018-07-25 DIAGNOSIS — M7060 Trochanteric bursitis, unspecified hip: Secondary | ICD-10-CM | POA: Diagnosis not present

## 2018-07-25 DIAGNOSIS — K219 Gastro-esophageal reflux disease without esophagitis: Secondary | ICD-10-CM | POA: Diagnosis not present

## 2018-07-25 DIAGNOSIS — Z7952 Long term (current) use of systemic steroids: Secondary | ICD-10-CM | POA: Diagnosis not present

## 2018-07-25 DIAGNOSIS — I2510 Atherosclerotic heart disease of native coronary artery without angina pectoris: Secondary | ICD-10-CM | POA: Diagnosis not present

## 2018-07-25 DIAGNOSIS — M47892 Other spondylosis, cervical region: Secondary | ICD-10-CM | POA: Diagnosis not present

## 2018-07-25 DIAGNOSIS — Z96642 Presence of left artificial hip joint: Secondary | ICD-10-CM | POA: Diagnosis not present

## 2018-07-27 DIAGNOSIS — E785 Hyperlipidemia, unspecified: Secondary | ICD-10-CM | POA: Diagnosis not present

## 2018-07-27 DIAGNOSIS — Z7982 Long term (current) use of aspirin: Secondary | ICD-10-CM | POA: Diagnosis not present

## 2018-07-27 DIAGNOSIS — E1121 Type 2 diabetes mellitus with diabetic nephropathy: Secondary | ICD-10-CM | POA: Diagnosis not present

## 2018-07-27 DIAGNOSIS — Z96642 Presence of left artificial hip joint: Secondary | ICD-10-CM | POA: Diagnosis not present

## 2018-07-27 DIAGNOSIS — M48061 Spinal stenosis, lumbar region without neurogenic claudication: Secondary | ICD-10-CM | POA: Diagnosis not present

## 2018-07-27 DIAGNOSIS — Z7952 Long term (current) use of systemic steroids: Secondary | ICD-10-CM | POA: Diagnosis not present

## 2018-07-27 DIAGNOSIS — M7060 Trochanteric bursitis, unspecified hip: Secondary | ICD-10-CM | POA: Diagnosis not present

## 2018-07-27 DIAGNOSIS — Z794 Long term (current) use of insulin: Secondary | ICD-10-CM | POA: Diagnosis not present

## 2018-07-27 DIAGNOSIS — M545 Low back pain: Secondary | ICD-10-CM | POA: Diagnosis not present

## 2018-07-27 DIAGNOSIS — Z87891 Personal history of nicotine dependence: Secondary | ICD-10-CM | POA: Diagnosis not present

## 2018-07-27 DIAGNOSIS — M5416 Radiculopathy, lumbar region: Secondary | ICD-10-CM | POA: Diagnosis not present

## 2018-07-27 DIAGNOSIS — I2510 Atherosclerotic heart disease of native coronary artery without angina pectoris: Secondary | ICD-10-CM | POA: Diagnosis not present

## 2018-07-27 DIAGNOSIS — M47892 Other spondylosis, cervical region: Secondary | ICD-10-CM | POA: Diagnosis not present

## 2018-07-27 DIAGNOSIS — G43909 Migraine, unspecified, not intractable, without status migrainosus: Secondary | ICD-10-CM | POA: Diagnosis not present

## 2018-07-27 DIAGNOSIS — K219 Gastro-esophageal reflux disease without esophagitis: Secondary | ICD-10-CM | POA: Diagnosis not present

## 2018-07-27 DIAGNOSIS — I10 Essential (primary) hypertension: Secondary | ICD-10-CM | POA: Diagnosis not present

## 2018-07-27 DIAGNOSIS — Z471 Aftercare following joint replacement surgery: Secondary | ICD-10-CM | POA: Diagnosis not present

## 2018-07-27 DIAGNOSIS — E1169 Type 2 diabetes mellitus with other specified complication: Secondary | ICD-10-CM | POA: Diagnosis not present

## 2018-08-02 IMAGING — DX DG SPINE 1V PORT
1 series · 1 of 1 positions shown · non-contrast
Comparison: Study obtained earlier in the day

CLINICAL DATA: Laminectomy

EXAM:
PORTABLE SPINE - 1 VIEW

[l-spine lat]
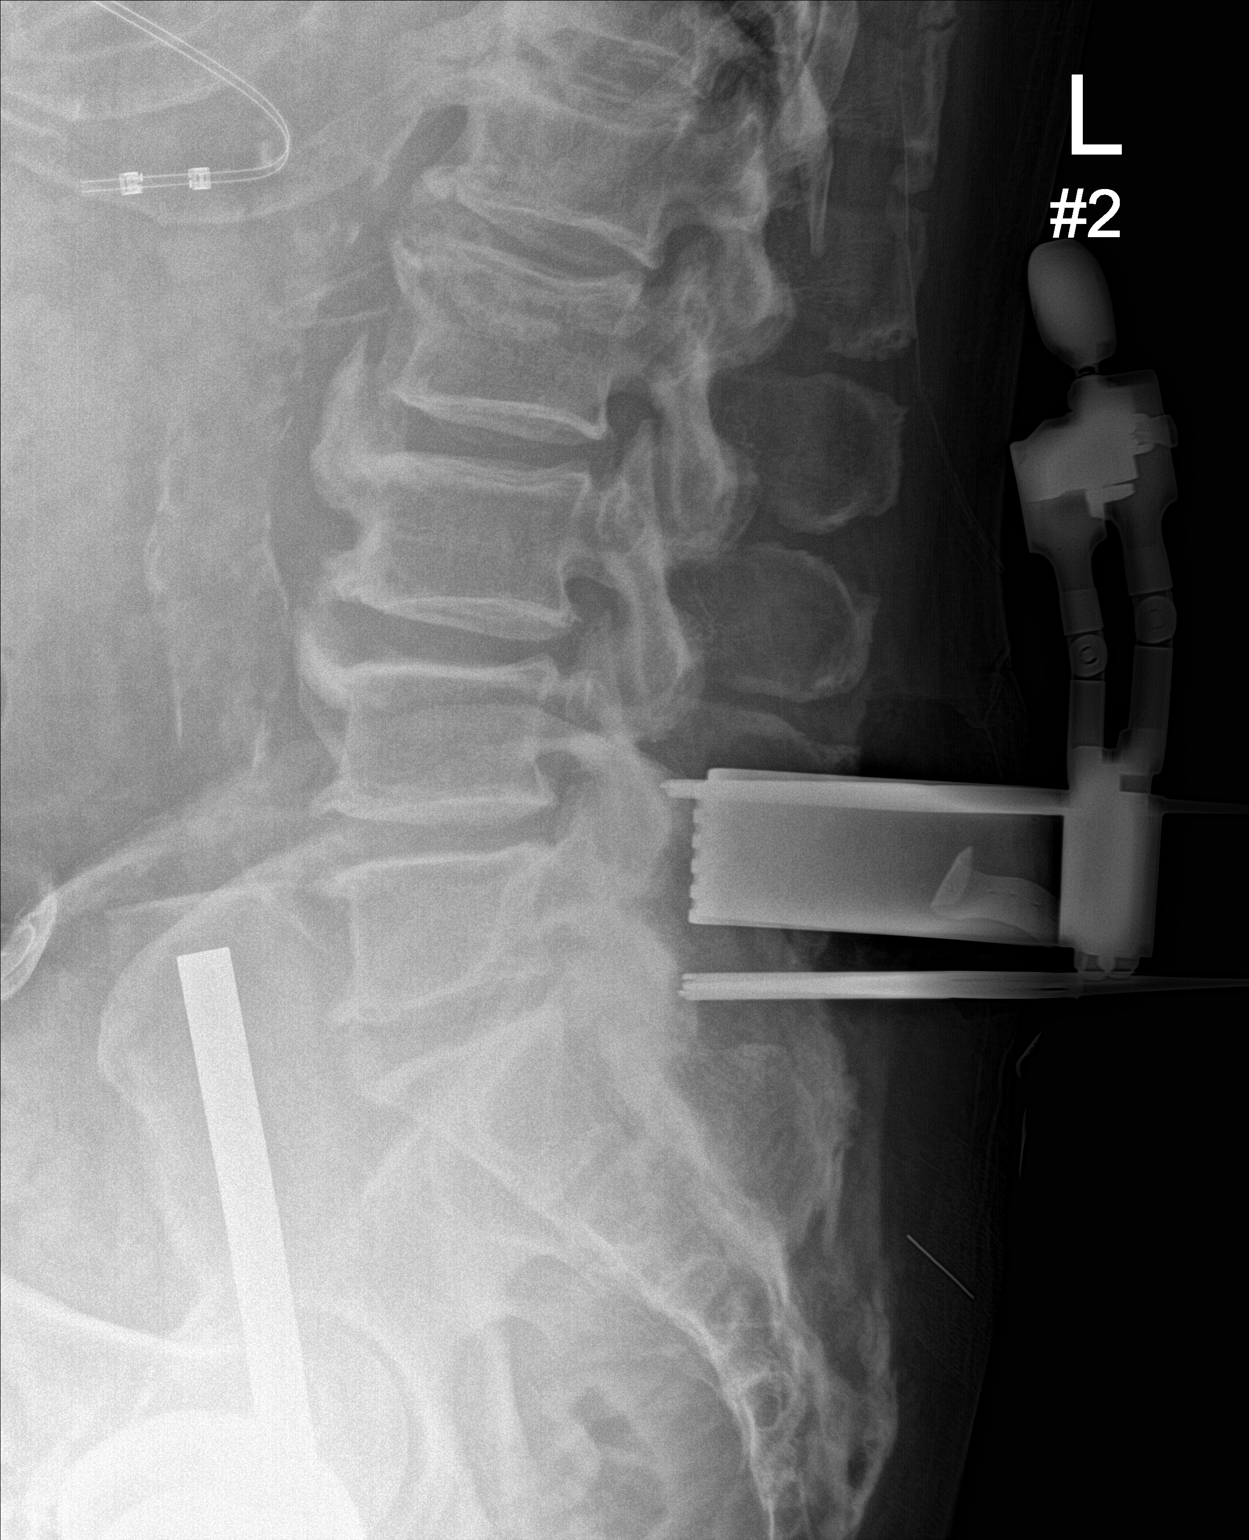

[1 of 1 positions shown; findings below may reference images not displayed]

FINDINGS: Cross-table lateral lumbar image labeled #2 submitted. Metallic
probe tips are posterior to the L4-5 and L5-S1 interspaces
respectively. Cutting tool overlies the L5 spinous process.

No fracture or spondylolisthesis. There are multiple anterior
osteophytes. There is aortoiliac atherosclerosis.
IMPRESSION: Metallic probe tips are posterior to the L4-5 and L5-S1 interspaces
respectively with cutting tool overlying the L5 spinous process.
Multiple anterior osteophytes noted. There is aortoiliac
atherosclerosis.

Aortic Atherosclerosis ([id number]).

## 2018-08-02 IMAGING — DX DG SPINE 1V PORT
1 series · 1 of 1 positions shown · non-contrast
Comparison: 02/16/2017

CLINICAL DATA: Back surgery.  Surgical localization

EXAM:
PORTABLE SPINE - 1 VIEW

[l-spine lat]
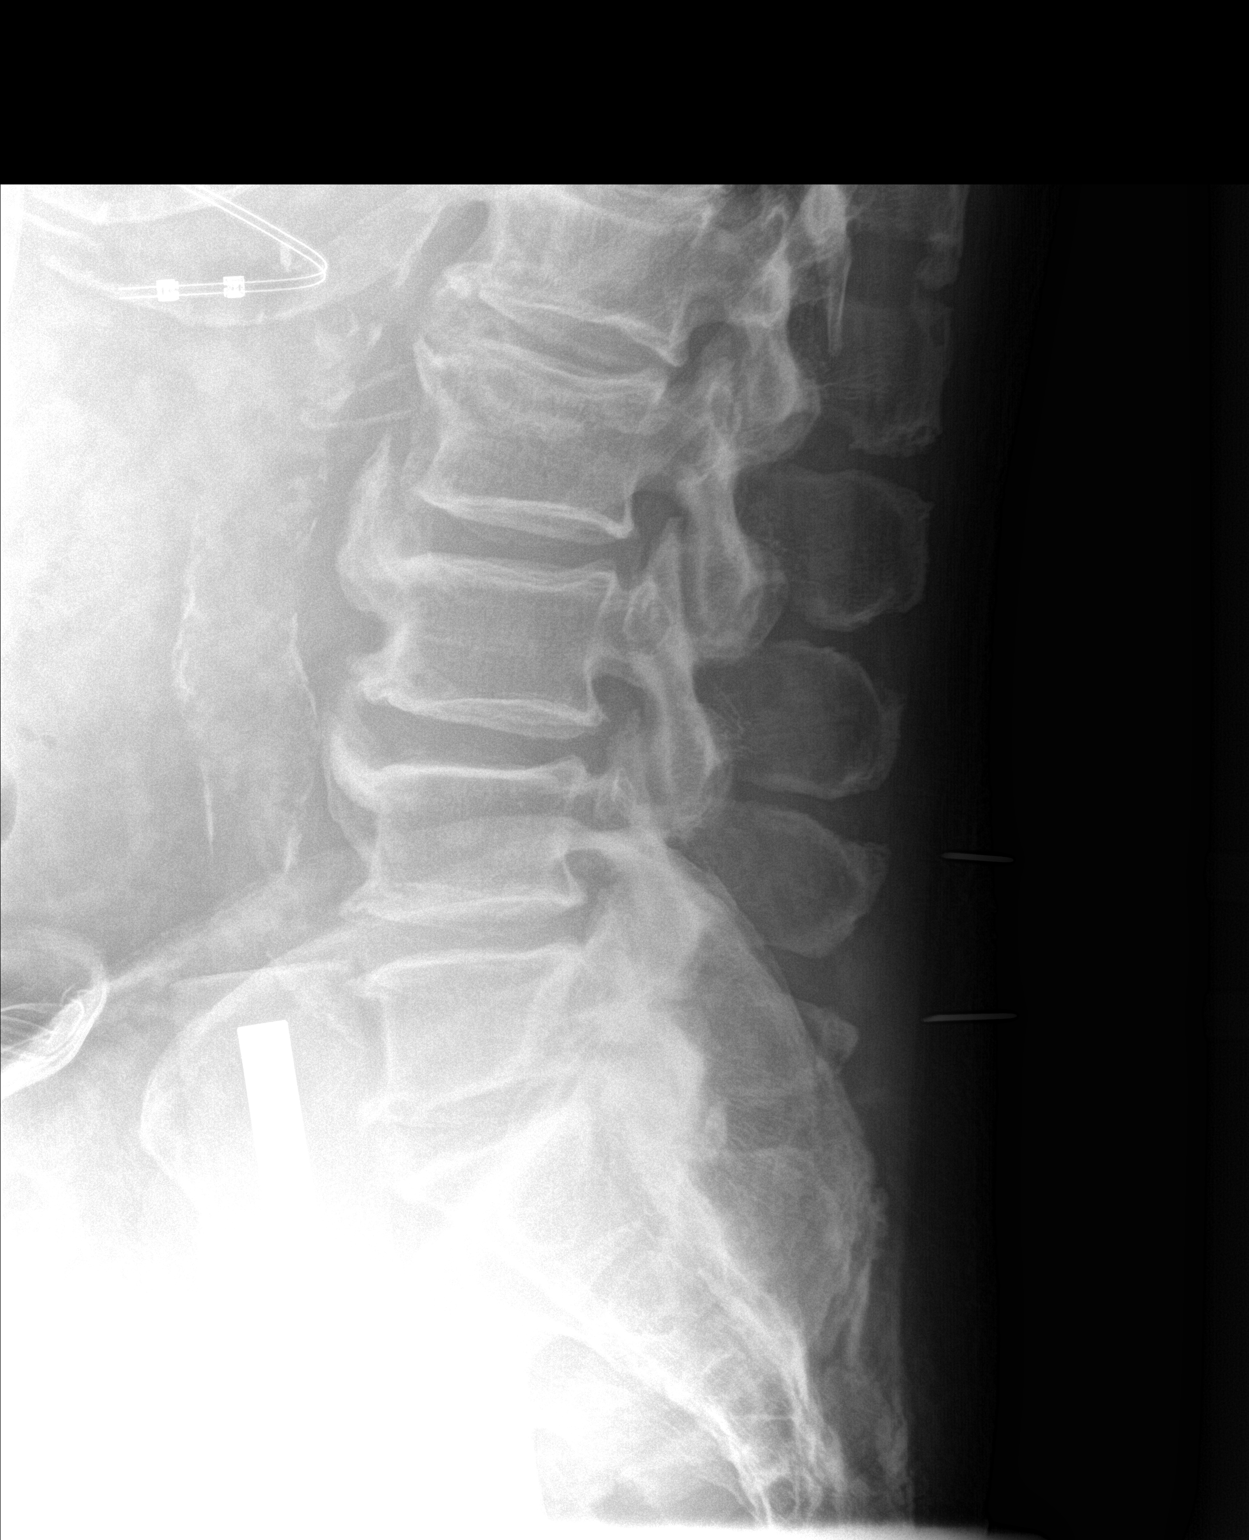

[1 of 1 positions shown; findings below may reference images not displayed]

FINDINGS: Single lateral view in the operating room of the lumbar spine was
obtained.

Needle directed at the spinous process of L4

Needle directed at the spinous process of L5
IMPRESSION: Localization of L4 and L5 spinous processes.

## 2018-08-13 ENCOUNTER — Encounter: Payer: Self-pay | Admitting: Adult Health

## 2018-08-15 ENCOUNTER — Encounter: Payer: Self-pay | Admitting: Adult Health

## 2018-08-15 ENCOUNTER — Telehealth: Payer: Self-pay | Admitting: Adult Health

## 2018-08-15 ENCOUNTER — Other Ambulatory Visit: Payer: Self-pay

## 2018-08-15 ENCOUNTER — Ambulatory Visit (INDEPENDENT_AMBULATORY_CARE_PROVIDER_SITE_OTHER): Payer: Medicare Other | Admitting: Adult Health

## 2018-08-15 VITALS — BP 147/79 | HR 84 | Temp 98.2°F | Ht 66.0 in | Wt 225.6 lb

## 2018-08-15 DIAGNOSIS — G4733 Obstructive sleep apnea (adult) (pediatric): Secondary | ICD-10-CM | POA: Diagnosis not present

## 2018-08-15 DIAGNOSIS — Z9989 Dependence on other enabling machines and devices: Secondary | ICD-10-CM

## 2018-08-15 NOTE — Telephone Encounter (Signed)
I called pts wife. I relayed I did send message about change of pressure.   She had ? About humidification.  I told her to call DME and ask, as I did not know.  I gave her # to [place] and she will call to check with them.Marland Kitchen

## 2018-08-15 NOTE — Patient Instructions (Signed)
Continue using CPAP nightly and greater than 4 hours each night °If your symptoms worsen or you develop new symptoms please let us know.  ° °

## 2018-08-15 NOTE — Progress Notes (Addendum)
PATIENT: [name] DOB: [birth year]  REASON FOR VISIT: follow up HISTORY FROM: patient  HISTORY OF PRESENT ILLNESS: Today 08/15/18:  [name] is a 78 year old male with a history of obstructive sleep apnea on CPAP.  His CPAP download indicates that he uses machine nightly for compliance of 100%.  He uses machine greater than 4 hours 29 out of 30 days for compliance of 97%.  On average he uses his machine 7 hours and 23 minutes.  His residual AHI is 2 on 8 cm of water with EPR of 3.  His leak in the 95th percentile is 10.9.  He reports that some nights he does not feel that he is getting enough air.  He is wondering if we can increase the pressure.  He returns today for follow-up.  HISTORY (copied from Dr. Guadelupe Sabin note) 01/24/2018: I reviewed his CPAP compliance data from 12/24/2017 through 01/22/2018 which is a total of 30 days, during which time he used his machine 24 days with a lapse noted in the first part secondary to hospitalization for meningoencephalitis. Average usage was 5 hours and 46 minutes, compliance percentage for more than 4 hours at 60%, slightly suboptimal but understandable secondary to hospitalization. AHI at goal at 3.2 per hour and average, leak on the lower end with the 95th percentile at 5.7 L/m on a pressure of 8 cm with EPR of 3. He reports feeling better with CPAP. He is motivated to continue with treatment. He feels improved as far as his daytime somnolence. His Epworth sleepiness score is 5 out of 24 today. His wife reports that he is doing better. Thankfully, he is back to baseline. Of note, he was recently hospitalized in December 2019 for altered mental status. His wife to take him to the emergency room twice in December, as the first time he was sent home. 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 He had lumbar puncture with CSF testing which did not show any evidence of bacterial or HSV meningoencephalitis  and antibiotics and antiviral medications were stopped. He did have increased protein and increased white cells in the CSF.  He had a brain MRI without contrast on 12/19/2017 and I reviewed the results: IMPRESSION: 1. Limited 3 sequence MRI of the head: No acute intracranial process. 2. Moderate parenchymal brain volume with superimposed image findings normal pressure hydrocephalus. 3. Moderate parenchymal brain volume loss.   REVIEW OF SYSTEMS: Out of a complete 14 system review of symptoms, the patient complains only of the following symptoms, and all other reviewed systems are negative.  ALLERGIES: Allergies  Allergen Reactions  . Ace Inhibitors Hives, Swelling and Rash    Rash,hives,tongue swelling  . Angiotensin Receptor Blockers     Unknown reaction   . Ciprofloxacin Other (See Comments)    confusion  . Oxycodone-Acetaminophen Other (See Comments)    Unknown reaction  . Robaxin [Methocarbamol] Other (See Comments)    Confusion   . Toradol [Ketorolac Tromethamine] Other (See Comments)    confusion  . Valium Other (See Comments)    Hallucinations; confusion  . Doxycycline Hives and Rash    HOME MEDICATIONS: Outpatient Medications Prior to Visit  Medication Sig Dispense Refill  . acetaminophen (TYLENOL) 650 MG CR tablet Take 1,300 mg by mouth every 8 (eight) hours as needed for pain.     Marland Kitchen amLODipine (NORVASC) 5 MG tablet Take 5 mg by mouth daily.    Marland Kitchen atorvastatin (LIPITOR) 20 MG tablet  TAKE 1 TABLET BY MOUTH  DAILY AT 6 PM. (Patient taking differently: Take 20 mg by mouth every evening. ) 90 tablet 3  . butalbital-acetaminophen-caffeine (FIORICET, ESGIC) 50-325-40 MG tablet Take 1 tablet by mouth 2 (two) times daily as needed for headache. 14 tablet 0  . cetirizine (ZYRTEC) 10 MG tablet Take 10 mg by mouth every evening.     . cyclobenzaprine (FLEXERIL) 10 MG tablet Take 1 tablet (10 mg total) by mouth 3 (three) times daily as needed for muscle spasms. 40 tablet 0  .  famotidine (PEPCID) 20 MG tablet TAKE 1 TABLET BY MOUTH TWO  TIMES DAILY (Patient taking differently: Take 20 mg by mouth 2 (two) times daily with a meal. ) 180 tablet 3  . HUMALOG KWIKPEN 100 UNIT/ML KwikPen INJECT 0 TO 15 UNITS  SUBCUTANEOUSLY 3 TIMES  DAILY BEFORE MEALS  (70-150=8 UNITS, 151-200=9  UNITS) (Patient taking differently: Inject 0-15 Units into the skin 3 (three) times daily before meals. Sliding scale) 45 mL 2  . HYDROcodone-acetaminophen (NORCO/VICODIN) 5-325 MG tablet Take 1-2 tablets by mouth every 6 (six) hours as needed for severe pain. 56 tablet 0  . ketorolac (ACULAR) 0.4 % SOLN Place 1 drop into the left eye 4 (four) times daily.    Marland Kitchen LANTUS SOLOSTAR 100 UNIT/ML Solostar Pen INJECT SUBCUTANEOUSLY 52  UNITS AT BEDTIME (Patient taking differently: 52 Units at bedtime. ) 60 mL 2  . losartan (COZAAR) 50 MG tablet TAKE 1 TABLET BY MOUTH  DAILY (Patient taking differently: Take 50 mg by mouth daily. ) 90 tablet 3  . metFORMIN (GLUCOPHAGE-XR) 500 MG 24 hr tablet TAKE 2 TABLETS BY MOUTH TWO TIMES DAILY (Patient taking differently: Take 1,000 mg by mouth 2 (two) times daily. ) 360 tablet 3  . nitroGLYCERIN (NITROSTAT) 0.4 MG SL tablet DISSOLVE ONE TABLET UNDER THE TONGUE EVERY 5 MINUTES AS NEEDED FOR CHEST PAIN.  DO NOT EXCEED A TOTAL OF 3 DOSES IN 15 MINUTES (Patient taking differently: Place 0.4 mg under the tongue every 5 (five) minutes as needed. DISSOLVE ONE TABLET UNDER THE TONGUE EVERY 5 MINUTES AS NEEDED FOR CHEST PAIN.  DO NOT EXCEED A TOTAL OF 3 DOSES IN 15 MINUTES) 25 tablet 3  . ONE TOUCH ULTRA TEST test strip USE 1 STRIP TO CHECK GLUCOSE 4 TIMES DAILY 400 each 3  . PARoxetine (PAXIL) 20 MG tablet Take 0.5 tablets (10 mg total) by mouth daily. (Patient taking differently: Take 20 mg by mouth at bedtime. )    . polyethylene glycol (MIRALAX / GLYCOLAX) packet Take 17 g by mouth daily. (Patient taking differently: Take 17 g by mouth daily as needed for mild constipation. ) 14 each 0   . polyvinyl alcohol (LIQUIFILM TEARS) 1.4 % ophthalmic solution Place 1 drop into both eyes as needed for dry eyes. 15 mL 0  . prednisoLONE acetate (PRED FORTE) 1 % ophthalmic suspension Place 1 drop into the left eye 4 (four) times daily.    . tamsulosin (FLOMAX) 0.4 MG CAPS capsule TAKE 1 CAPSULE BY MOUTH  DAILY (Patient taking differently: Take 0.4 mg by mouth every evening. ) 90 capsule 3  . tobramycin (TOBREX) 0.3 % ophthalmic solution Place 1 drop into the left eye 4 (four) times daily.    . traMADol (ULTRAM) 50 MG tablet Take 1-2 tablets (50-100 mg total) by mouth every 6 (six) hours as needed for moderate pain. 40 tablet 0  . rivaroxaban (XARELTO) 10 MG TABS tablet Take 1 tablet (10  mg total) by mouth daily with breakfast for 20 days. Then resume one 81 mg aspirin once a day. 20 tablet 0   No facility-administered medications prior to visit.     PAST MEDICAL HISTORY: Past Medical History:  Diagnosis Date  . Anxiety disorder   . Aortic atherosclerosis ([place]) 12/19/2017  . Arthritis   . Bursitis of hip   . Cervical spondylosis   . Chronic headaches   . Coronary atherosclerosis of native coronary artery    BMS nondominant RCA 12/2004  . Essential hypertension   . GERD (gastroesophageal reflux disease)   . History of adenomatous polyp of colon   . History of kidney stones   . History of MI (myocardial infarction) 12/2004  . History of viral meningitis 02/28/2018   December 2019  . Hyperlipidemia   . Internal hemorrhoids   . Low back pain   . Nocturia more than twice per night   . Numbness of left foot   . OSA (obstructive sleep apnea)   . Spinal stenosis   . Type 2 diabetes mellitus treated with insulin ([place])   . Urgency of urination   . Wears dentures    upper    PAST SURGICAL HISTORY: Past Surgical History:  Procedure Laterality Date  . CARDIAC CATHETERIZATION  06-14-2004  dr Lia Foyer   total occlusion mD1, RI 30-40%, mRCA nondominant hazy 80% and scattered 30-40%,   ef 71% (medical mangement)  . CARDIOVASCULAR STRESS TEST  09-17-2015   dr Domenic Polite   Low risk nuclear study w/ reversible mild small anteroapical defect,  normal LV function and wall motion, nuclear stress ef 61%  . CATARACT EXTRACTION W/ INTRAOCULAR LENS IMPLANT Right 07/2014  . CHOLECYSTECTOMY    . COLONOSCOPY  09/10/2008   normal  . CORONARY ANGIOPLASTY WITH STENT PLACEMENT  12-17-2004   dr benismhon/ dr Olevia Perches   nonobstructive cad LAD and LCFx,  BMS x1 to total occlusion RCA   . CYSTOSCOPY W/ URETEROSCOPY W/ LITHOTRIPSY  08/2010  . ERCP  03/14/2008  . LAMINECTOMY    . LAPAROSCOPIC CHOLECYSTECTOMY  05/2012  . LEFT HEART CATHETERIZATION WITH CORONARY ANGIOGRAM N/A 05/14/2011   Procedure: LEFT HEART CATHETERIZATION WITH CORONARY ANGIOGRAM;  Surgeon: Sherren Mocha, MD;  Location: [facility] CATH LAB;  Service: Cardiovascular;  Laterality: N/A;    non-obstructive LM, LAD, LCFx, patent RCA stent  . LUMBAR LAMINECTOMY/DECOMPRESSION MICRODISCECTOMY N/A 02/24/2017   Procedure: Microlumbar decompression L4-5, L5-S1;  Surgeon: Susa Day, MD;  Location: [facility] ORS;  Service: Orthopedics;  Laterality: N/A;  120 mins  . ROTATOR CUFF REPAIR Right 03/2002  . ROTATOR CUFF REPAIR Left 12/11/2015  . TOTAL HIP ARTHROPLASTY Right 12/20/2008  . TOTAL HIP ARTHROPLASTY Left 07/05/2018   Procedure: TOTAL HIP ARTHROPLASTY ANTERIOR APPROACH;  Surgeon: Gaynelle Arabian, MD;  Location: [facility] ORS;  Service: Orthopedics;  Laterality: Left;  174min  . TOTAL KNEE ARTHROPLASTY Bilateral left 12-11-2003/  right 07-31-2004   dr Wynelle Link [facility]  . UPPER GASTROINTESTINAL ENDOSCOPY  01/08/2008   bx, inlet patch, duodenitis  . YAG LASER APPLICATION Right 7/61/6073   Procedure: YAG LASER APPLICATION;  Surgeon: Williams Che, MD;  Location: [facility] ORS;  Service: Ophthalmology;  Laterality: Right;    FAMILY HISTORY: Family History  Problem Relation Age of Onset  . Colon cancer Mother        Diagnosed age 26  . Cancer Mother 37  . Heart  disease Father   . Heart attack Father   . Breast cancer Sister   .  Heart disease Brother   . Brain cancer Sister   . Heart disease Brother   . Bladder Cancer Brother   . Cancer - Other Brother     SOCIAL HISTORY: Social History   Socioeconomic History  . Marital status: Married    Spouse name: Diane   . Number of children: 1  . Years of education: HS  . Highest education level: High school graduate  Occupational History  . Occupation: RETIRED    Employer: [employer]    Comment: Power plant  Social Needs  . Financial resource strain: Not hard at all  . Food insecurity    Worry: Never true    Inability: Never true  . Transportation needs    Medical: No    Non-medical: No  Tobacco Use  . Smoking status: Former Smoker    Packs/day: 1.00    Years: 20.00    Pack years: 20.00    Types: Cigarettes    Start date: 01/18/1963    Quit date: 08/11/1988    Years since quitting: 30.0  . Smokeless tobacco: Former Systems developer    Types: Chew    Quit date: 01/11/1989  . Tobacco comment: chewed 1 pack tobacco/day for 15 years  Substance and Sexual Activity  . Alcohol use: No    Alcohol/week: 0.0 standard drinks    Frequency: Never  . Drug use: No  . Sexual activity: Yes  Lifestyle  . Physical activity    Days per week: 0 days    Minutes per session: 0 min  . Stress: Only a little  Relationships  . Social connections    Talks on phone: More than three times a week    Gets together: More than three times a week    Attends religious service: 1 to 4 times per year    Active member of club or organization: No    Attends meetings of clubs or organizations: Never    Relationship status: Married  . Intimate partner violence    Fear of current or ex partner: No    Emotionally abused: No    Physically abused: No    Forced sexual activity: No  Other Topics Concern  . Not on file  Social History Narrative   No regular exercise      Daily caffeine use: 3 cups   Patient is right handed.     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  Generalized: Well developed, in no acute distress Chest: Lungs clear to auscultation bilaterally  Neurological examination  Mentation: Alert oriented to time, place, history taking. Follows all commands speech and language fluent Cranial nerve II-XII: . Extraocular movements were full, visual field were full on confrontational test.  Head turning and shoulder shrug  were normal and symmetric. Motor: The motor testing reveals 5 over 5 strength of all 4 extremities. Good symmetric motor tone is noted throughout.  Sensory: Sensory testing is intact to soft touch on all 4 extremities. No evidence of extinction is noted.  Gait and station: Gait is normal.    DIAGNOSTIC DATA (LABS, IMAGING, TESTING) - I reviewed patient records, labs, notes, testing  and imaging myself where available.  Lab Results  Component Value Date   WBC 11.2 (H) 07/06/2018   HGB 12.1 (L) 07/06/2018   HCT 38.7 (L) 07/06/2018   MCV 92.1 07/06/2018   PLT 261 07/06/2018      Component Value Date/Time   NA 137 07/06/2018 0250   NA 141 05/15/2018 0929   K 4.0 07/06/2018 0250   CL 103 07/06/2018 0250   CO2 27 07/06/2018 0250   GLUCOSE 228 (H) 07/06/2018 0250   BUN 17 07/06/2018 0250   BUN 13 05/15/2018 0929   CREATININE 0.84 07/06/2018 0250   CREATININE 0.93 06/02/2012 1007   CALCIUM 8.4 (L) 07/06/2018 0250   PROT 7.6 06/30/2018 1108   PROT 7.1 05/15/2018 0929   ALBUMIN 3.9 06/30/2018 1108   ALBUMIN 4.1 05/15/2018 0929   AST 21 06/30/2018 1108   ALT 21 06/30/2018 1108   ALKPHOS 69 06/30/2018 1108   BILITOT 0.6 06/30/2018 1108   BILITOT 0.4 05/15/2018 0929   GFRNONAA >60 07/06/2018 0250   GFRNONAA 82  06/02/2012 1007   GFRAA >60 07/06/2018 0250   GFRAA >89 06/02/2012 1007   Lab Results  Component Value Date   CHOL 126 05/15/2018   HDL 35 (L) 05/15/2018   LDLCALC 61 05/15/2018   TRIG 148 05/15/2018   CHOLHDL 3.6 05/15/2018   Lab Results  Component Value Date   HGBA1C 7.2 (H) 06/30/2018   Lab Results  Component Value Date   VITAMINB12 1,002 02/28/2018   Lab Results  Component Value Date   TSH 0.930 12/15/2017      ASSESSMENT AND PLAN 78 y.o. year old male  has a past medical history of Anxiety disorder, Aortic atherosclerosis ([place]) (12/19/2017), Arthritis, Bursitis of hip, Cervical spondylosis, Chronic headaches, Coronary atherosclerosis of native coronary artery, Essential hypertension, GERD (gastroesophageal reflux disease), History of adenomatous polyp of colon, History of kidney stones, History of MI (myocardial infarction) (12/2004), History of viral meningitis (02/28/2018), Hyperlipidemia, Internal hemorrhoids, Low back pain, Nocturia more than twice per night, Numbness of left foot, OSA (obstructive sleep apnea), Spinal stenosis, Type 2 diabetes mellitus treated with insulin ([place]), Urgency of urination, and Wears dentures. here with:  1.  Obstructive sleep apnea on CPAP  The patient's CPAP download shows excellent compliance and good treatment of his apnea.  He is encouraged to continue using CPAP nightly and greater than 4 hours each night.  I will increase his pressure to 9 cm of water to see if that is more comfortable for him.  He is advised that if his symptoms worsen or he develops new symptoms he should let us know.  He will follow-up in 1 year or sooner if needed    I spent 15 minutes with the patient. 50% of this time was spent reviewing CPAP download   Ward Givens, MSN, NP-C 08/15/2018, 9:25 AM [facility] [address], [place], [place] [address] [phone]  I reviewed the above note and documentation by the Nurse Practitioner  and agree with the history, physical exam, assessment and plan as outlined above. I was immediately available for consultation. Star Age, MD, PhD [facility])

## 2018-08-15 NOTE — Telephone Encounter (Signed)
Pt's wife called wanting to speak to the provider about changing the settings for the cpap. Please advise.

## 2018-08-16 NOTE — Telephone Encounter (Signed)
Received from [facility], received new cpap orders.

## 2018-08-21 DIAGNOSIS — R350 Frequency of micturition: Secondary | ICD-10-CM | POA: Diagnosis not present

## 2018-08-21 DIAGNOSIS — R3915 Urgency of urination: Secondary | ICD-10-CM | POA: Diagnosis not present

## 2018-08-22 DIAGNOSIS — G4733 Obstructive sleep apnea (adult) (pediatric): Secondary | ICD-10-CM | POA: Diagnosis not present

## 2018-08-22 DIAGNOSIS — G44009 Cluster headache syndrome, unspecified, not intractable: Secondary | ICD-10-CM | POA: Diagnosis not present

## 2018-08-22 DIAGNOSIS — Z96652 Presence of left artificial knee joint: Secondary | ICD-10-CM | POA: Diagnosis not present

## 2018-08-22 DIAGNOSIS — Z96642 Presence of left artificial hip joint: Secondary | ICD-10-CM | POA: Diagnosis not present

## 2018-08-22 DIAGNOSIS — Z471 Aftercare following joint replacement surgery: Secondary | ICD-10-CM | POA: Diagnosis not present

## 2018-08-24 ENCOUNTER — Other Ambulatory Visit: Payer: Self-pay

## 2018-08-25 ENCOUNTER — Other Ambulatory Visit: Payer: Self-pay | Admitting: Family Medicine

## 2018-08-25 ENCOUNTER — Other Ambulatory Visit: Payer: Self-pay

## 2018-08-25 ENCOUNTER — Ambulatory Visit (INDEPENDENT_AMBULATORY_CARE_PROVIDER_SITE_OTHER): Payer: Medicare Other | Admitting: Family Medicine

## 2018-08-25 ENCOUNTER — Encounter: Payer: Self-pay | Admitting: Family Medicine

## 2018-08-25 VITALS — BP 136/68 | HR 71 | Temp 98.9°F | Ht 66.0 in | Wt 227.4 lb

## 2018-08-25 DIAGNOSIS — Z794 Long term (current) use of insulin: Secondary | ICD-10-CM | POA: Diagnosis not present

## 2018-08-25 DIAGNOSIS — E785 Hyperlipidemia, unspecified: Secondary | ICD-10-CM

## 2018-08-25 DIAGNOSIS — E1159 Type 2 diabetes mellitus with other circulatory complications: Secondary | ICD-10-CM

## 2018-08-25 DIAGNOSIS — K219 Gastro-esophageal reflux disease without esophagitis: Secondary | ICD-10-CM

## 2018-08-25 DIAGNOSIS — I10 Essential (primary) hypertension: Secondary | ICD-10-CM

## 2018-08-25 DIAGNOSIS — I152 Hypertension secondary to endocrine disorders: Secondary | ICD-10-CM

## 2018-08-25 DIAGNOSIS — E1169 Type 2 diabetes mellitus with other specified complication: Secondary | ICD-10-CM | POA: Diagnosis not present

## 2018-08-25 DIAGNOSIS — E1129 Type 2 diabetes mellitus with other diabetic kidney complication: Secondary | ICD-10-CM | POA: Diagnosis not present

## 2018-08-25 DIAGNOSIS — R809 Proteinuria, unspecified: Secondary | ICD-10-CM

## 2018-08-25 LAB — BAYER DCA HB A1C WAIVED: HB A1C (BAYER DCA - WAIVED): 7.1 % — ABNORMAL HIGH (ref <7.0)

## 2018-08-25 MED ORDER — ATORVASTATIN CALCIUM 20 MG PO TABS: 20.0000 mg | ORAL_TABLET | Freq: Every evening | ORAL | 3 refills | Status: DC

## 2018-08-25 MED ORDER — FAMOTIDINE 20 MG PO TABS: 20.0000 mg | ORAL_TABLET | Freq: Two times a day (BID) | ORAL | 3 refills | Status: DC

## 2018-08-25 NOTE — Progress Notes (Signed)
BP 136/68   Pulse 71   Temp 98.9 F (37.2 C) (Temporal)   Ht '5\' 6"'  (1.676 m)   Wt 227 lb 6.4 oz (103.1 kg)   BMI 36.70 kg/m    Subjective:   Patient ID: [name], male    DOB: Sep 03, 1940, 78 y.o.   MRN: [id number]  HPI: [name] is a 78 y.o. male presenting on 08/25/2018 for Diabetes (3 month followup) and Hypertension   HPI Patient just recently had hip arthroplasty and feels like it is doing well for him.  Type 2 diabetes mellitus Patient comes in today for recheck of his diabetes. Patient has been currently taking metformin and Levemir 52 nightly and Humalog 10-15 3 times daily, he says since his hip surgery and being on steroids for his hips his blood sugars have been anywhere from 90 to over 200 and have been fluctuating widely throughout the day.. Patient is currently on an ACE inhibitor/ARB. Patient has not seen an ophthalmologist this year. Patient denies any issues with their feet.  Patient has microalbuminuria in his history  Hyperlipidemia Patient is coming in for recheck of his hyperlipidemia. The patient is currently taking atorvastatin. They deny any issues with myalgias or history of liver damage from it. They deny any focal numbness or weakness or chest pain.   Hypertension Patient is currently on losartan and amlodipine, and their blood pressure today is 136/68. Patient denies any lightheadedness or dizziness. Patient denies headaches, blurred vision, chest pains, shortness of breath, or weakness. Denies any side effects from medication and is content with current medication.   Relevant past medical, surgical, family and social history reviewed and updated as indicated. Interim medical history since our last visit reviewed. Allergies and medications reviewed and updated.  Review of Systems  Constitutional: Negative for chills and fever.  Respiratory: Negative for shortness of breath and wheezing.   Cardiovascular: Negative for chest pain and leg  swelling.  Gastrointestinal: Negative for abdominal pain.  Musculoskeletal: Positive for arthralgias. Negative for back pain and gait problem.  Skin: Negative for rash.  Neurological: Negative for dizziness, weakness and light-headedness.  All other systems reviewed and are negative.   Per HPI unless specifically indicated above   Allergies as of 08/25/2018      Reactions   Ace Inhibitors Hives, Swelling, Rash   Rash,hives,tongue swelling   Angiotensin Receptor Blockers    Unknown reaction    Ciprofloxacin Other (See Comments)   confusion   Oxycodone-acetaminophen Other (See Comments)   Unknown reaction   Robaxin [methocarbamol] Other (See Comments)   Confusion    Toradol [ketorolac Tromethamine] Other (See Comments)   confusion   Valium Other (See Comments)   Hallucinations; confusion   Doxycycline Hives, Rash      Medication List       Accurate as of August 25, 2018  9:16 AM. If you have any questions, ask your nurse or doctor.        STOP taking these medications   butalbital-acetaminophen-caffeine 50-325-40 MG tablet Commonly known as: FIORICET Stopped by: [name]   cyclobenzaprine 10 MG tablet Commonly known as: FLEXERIL Stopped by: [name]   HYDROcodone-acetaminophen 5-325 MG tablet Commonly known as: NORCO/VICODIN Stopped by: [name]   ketorolac 0.4 % Soln Commonly known as: Administrator, sports Stopped by: [name]   polyethylene glycol 17 g packet Commonly known as: MIRALAX / GLYCOLAX Stopped by: [name]  polyvinyl alcohol 1.4 % ophthalmic solution Commonly known as: LIQUIFILM TEARS Stopped by: [name] Kaufmann Saryah Loper, [name]   prednisoLONE acetate 1 % ophthalmic suspension Commonly known as: PRED FORTE Stopped by: [name]   rivaroxaban 10 MG Tabs tablet Commonly known as: XARELTO Stopped by: [name] Kaufmann [name]   tamsulosin 0.4 MG Caps capsule Commonly known as: FLOMAX  Stopped by: [name] Kaufmann [name]   tobramycin 0.3 % ophthalmic solution Commonly known as: TOBREX Stopped by: [name]   traMADol 50 MG tablet Commonly known as: ULTRAM Stopped by: [name] Kaufmann [name]     TAKE these medications   acetaminophen 650 MG CR tablet Commonly known as: TYLENOL Take 1,300 mg by mouth every 8 (eight) hours as needed for pain.   amLODipine 5 MG tablet Commonly known as: NORVASC Take 5 mg by mouth daily.   aspirin EC 81 MG tablet Take 81 mg by mouth daily.   atorvastatin 20 MG tablet Commonly known as: LIPITOR Take 1 tablet (20 mg total) by mouth every evening. What changed: See the new instructions. Changed by: [name] Kaufmann Christean Silvestri, [name]   cetirizine 10 MG tablet Commonly known as: ZYRTEC Take 10 mg by mouth every evening.   famotidine 20 MG tablet Commonly known as: PEPCID Take 1 tablet (20 mg total) by mouth 2 (two) times daily with a meal.   HumaLOG KwikPen 100 UNIT/ML KwikPen Generic drug: insulin lispro INJECT 0 TO 15 UNITS  SUBCUTANEOUSLY 3 TIMES  DAILY BEFORE MEALS  (70-150=8 UNITS, 151-200=9  UNITS) What changed: See the new instructions.   Lantus SoloStar 100 UNIT/ML Solostar Pen Generic drug: Insulin Glargine INJECT SUBCUTANEOUSLY 52  UNITS AT BEDTIME What changed: See the new instructions.   losartan 50 MG tablet Commonly known as: COZAAR TAKE 1 TABLET BY MOUTH  DAILY   metFORMIN 500 MG 24 hr tablet Commonly known as: GLUCOPHAGE-XR TAKE 2 TABLETS BY MOUTH TWO TIMES DAILY What changed: See the new instructions.   Myrbetriq 50 MG Tb24 tablet Generic drug: mirabegron ER Take 50 mg by mouth daily.   nitroGLYCERIN 0.4 MG SL tablet Commonly known as: NITROSTAT DISSOLVE ONE TABLET UNDER THE TONGUE EVERY 5 MINUTES AS NEEDED FOR CHEST PAIN.  DO NOT EXCEED A TOTAL OF 3 DOSES IN 15 MINUTES What changed:   how much to take  how to take this  when to take this  reasons to take this   ONE TOUCH ULTRA TEST  test strip Generic drug: glucose blood USE 1 STRIP TO CHECK GLUCOSE 4 TIMES DAILY   PARoxetine 20 MG tablet Commonly known as: PAXIL Take 0.5 tablets (10 mg total) by mouth daily. What changed:   how much to take  when to take this        Objective:   BP 136/68   Pulse 71   Temp 98.9 F (37.2 C) (Temporal)   Ht '5\' 6"'  (1.676 m)   Wt 227 lb 6.4 oz (103.1 kg)   BMI 36.70 kg/m   Wt Readings from Last 3 Encounters:  08/25/18 227 lb 6.4 oz (103.1 kg)  08/15/18 225 lb 9.6 oz (102.3 kg)  07/05/18 229 lb (103.9 kg)    Physical Exam Vitals signs and nursing note reviewed.  Constitutional:      General: He is not in acute distress.    Appearance: He is well-developed. He is not diaphoretic.  Eyes:     General: No scleral icterus.    Conjunctiva/sclera: Conjunctivae normal.  Neck:     Musculoskeletal: Neck supple.     Thyroid: No thyromegaly.  Cardiovascular:     Rate and Rhythm: Normal rate and regular rhythm.     Heart sounds: Murmur (Systolic crescendo decrescendo murmur, 3 out of 6, best heard at both second intercostal spaces) present.  Pulmonary:     Effort: Pulmonary effort is normal. No respiratory distress.     Breath sounds: Normal breath sounds. No wheezing.  Abdominal:     General: Abdomen is flat. There is no distension.     Tenderness: There is no abdominal tenderness. There is no guarding or rebound.  Lymphadenopathy:     Cervical: No cervical adenopathy.  Skin:    General: Skin is warm and dry.     Findings: No rash.  Neurological:     Mental Status: He is alert and oriented to person, place, and time.     Coordination: Coordination normal.  Psychiatric:        Behavior: Behavior normal.       Assessment & Plan:   Problem List Items Addressed This Visit      Cardiovascular and Mediastinum   Hypertension associated with diabetes ([place])   Relevant Medications   aspirin EC 81 MG tablet   atorvastatin (LIPITOR) 20 MG tablet   Other Relevant  Orders   CMP14+EGFR   Type 2 diabetes mellitus with circulatory disorder (HCC) - Primary   Relevant Medications   aspirin EC 81 MG tablet   atorvastatin (LIPITOR) 20 MG tablet   Other Relevant Orders   Bayer DCA Hb A1c Waived     Digestive   GERD (gastroesophageal reflux disease)   Relevant Medications   famotidine (PEPCID) 20 MG tablet   Other Relevant Orders   CBC with Differential/Platelet     Endocrine   Hyperlipidemia associated with type 2 diabetes mellitus (HCC)   Relevant Medications   aspirin EC 81 MG tablet   atorvastatin (LIPITOR) 20 MG tablet   Other Relevant Orders   Lipid panel   Microalbuminuria due to type 2 diabetes mellitus (HCC)   Relevant Medications   aspirin EC 81 MG tablet   atorvastatin (LIPITOR) 20 MG tablet   Other Relevant Orders   Bayer DCA Hb A1c Waived      Will check blood work today, recommended that he increase his NovoLog intake at least by a few units so it is either 12-17 or 15-20 as his baseline Follow up plan: Return in about 3 months (around 11/25/2018), or if symptoms worsen or fail to improve, for Diabetes recheck.  Counseling provided for all of the vaccine components Orders Placed This Encounter  Procedures  . CBC with Differential/Platelet  . CMP14+EGFR  . Lipid panel  . Bayer [facility] Hb A1c Bassett, [name] [facility] 08/25/2018, 9:16 AM

## 2018-08-26 LAB — CMP14+EGFR
ALT: 12 IU/L (ref 0–44)
AST: 11 IU/L (ref 0–40)
Albumin/Globulin Ratio: 1.2 (ref 1.2–2.2)
Albumin: 3.7 g/dL (ref 3.7–4.7)
Alkaline Phosphatase: 95 IU/L (ref 39–117)
BUN/Creatinine Ratio: 15 (ref 10–24)
BUN: 15 mg/dL (ref 8–27)
Bilirubin Total: 0.4 mg/dL (ref 0.0–1.2)
CO2: 22 mmol/L (ref 20–29)
Calcium: 8.9 mg/dL (ref 8.6–10.2)
Chloride: 100 mmol/L (ref 96–106)
Creatinine, Ser: 0.97 mg/dL (ref 0.76–1.27)
GFR calc Af Amer: 86 mL/min/{1.73_m2} (ref >59)
GFR calc non Af Amer: 74 mL/min/{1.73_m2} (ref >59)
Globulin, Total: 3.2 g/dL (ref 1.5–4.5)
Glucose: 161 mg/dL — ABNORMAL HIGH (ref 65–99)
Potassium: 4.5 mmol/L (ref 3.5–5.2)
Sodium: 139 mmol/L (ref 134–144)
Total Protein: 6.9 g/dL (ref 6.0–8.5)

## 2018-08-26 LAB — LIPID PANEL
Chol/HDL Ratio: 3 ratio (ref 0.0–5.0)
Cholesterol, Total: 112 mg/dL (ref 100–199)
HDL: 37 mg/dL — ABNORMAL LOW (ref >39)
LDL Calculated: 55 mg/dL (ref 0–99)
Triglycerides: 102 mg/dL (ref 0–149)
VLDL Cholesterol Cal: 20 mg/dL (ref 5–40)

## 2018-08-26 LAB — CBC WITH DIFFERENTIAL/PLATELET
Basophils Absolute: 0.1 10*3/uL (ref 0.0–0.2)
Basos: 1 %
EOS (ABSOLUTE): 0.2 10*3/uL (ref 0.0–0.4)
Eos: 2 %
Hematocrit: 37.8 % (ref 37.5–51.0)
Hemoglobin: 12 g/dL — ABNORMAL LOW (ref 13.0–17.7)
Immature Grans (Abs): 0 10*3/uL (ref 0.0–0.1)
Immature Granulocytes: 0 %
Lymphocytes Absolute: 1.7 10*3/uL (ref 0.7–3.1)
Lymphs: 22 %
MCH: 27.5 pg (ref 26.6–33.0)
MCHC: 31.7 g/dL (ref 31.5–35.7)
MCV: 87 fL (ref 79–97)
Monocytes Absolute: 0.6 10*3/uL (ref 0.1–0.9)
Monocytes: 8 %
Neutrophils Absolute: 5.3 10*3/uL (ref 1.4–7.0)
Neutrophils: 67 %
Platelets: 379 10*3/uL (ref 150–450)
RBC: 4.37 x10E6/uL (ref 4.14–5.80)
RDW: 13.5 % (ref 11.6–15.4)
WBC: 7.9 10*3/uL (ref 3.4–10.8)

## 2018-09-20 ENCOUNTER — Telehealth: Payer: Self-pay | Admitting: Cardiology

## 2018-09-20 NOTE — Telephone Encounter (Signed)
Virtual Visit Pre-Appointment Phone Call  "(Name), I am calling you today to discuss your upcoming appointment. We are currently trying to limit exposure to the virus that causes COVID-19 by seeing patients at home rather than in the office."  1. "What is the BEST phone number to call the day of the visit?" - include this in appointment notes  2. Do you have or have access to (through a family member/friend) a smartphone with video capability that we can use for your visit?" a. If yes - list this number in appt notes as cell (if different from BEST phone #) and list the appointment type as a VIDEO visit in appointment notes b. If no - list the appointment type as a PHONE visit in appointment notes  3. Confirm consent - "In the setting of the current Covid19 crisis, you are scheduled for a (phone or video) visit with your provider on (date) at (time).  Just as we do with many in-office visits, in order for you to participate in this visit, we must obtain consent.  If you'd like, I can send this to your mychart (if signed up) or email for you to review.  Otherwise, I can obtain your verbal consent now.  All virtual visits are billed to your insurance company just like a normal visit would be.  By agreeing to a virtual visit, we'd like you to understand that the technology does not allow for your provider to perform an examination, and thus may limit your provider's ability to fully assess your condition. If your provider identifies any concerns that need to be evaluated in person, we will make arrangements to do so.  Finally, though the technology is pretty good, we cannot assure that it will always work on either your or our end, and in the setting of a video visit, we may have to convert it to a phone-only visit.  In either situation, we cannot ensure that we have a secure connection.  Are you willing to proceed?" STAFF: Did the patient verbally acknowledge consent to telehealth visit? Document  YES/NO here: yes  4. Advise patient to be prepared - "Two hours prior to your appointment, go ahead and check your blood pressure, pulse, oxygen saturation, and your weight (if you have the equipment to check those) and write them all down. When your visit starts, your provider will ask you for this information. If you have an Apple Watch or Kardia device, please plan to have heart rate information ready on the day of your appointment. Please have a pen and paper handy nearby the day of the visit as well."  5. Give patient instructions for MyChart download to smartphone OR [email or web address] as below if video visit (depending on what platform provider is using)  6. Inform patient they will receive a phone call 15 minutes prior to their appointment time (may be from unknown caller ID) so they should be prepared to answer    TELEPHONE CALL NOTE  Mae R Kitchin has been deemed a candidate for a follow-up tele-health visit to limit community exposure during the Covid-19 pandemic. I spoke with the patient via phone to ensure availability of phone/video source, confirm preferred email & phone number, and discuss instructions and expectations.  I reminded [name] to be prepared with any vital sign and/or heart rhythm information that could potentially be obtained via home monitoring, at the time of his visit. I reminded [name] to expect a phone call prior to  his visit.  Weston Anna 09/20/2018 10:32 AM   INSTRUCTIONS FOR DOWNLOADING THE MYCHART APP TO SMARTPHONE  - The patient must first make sure to have activated MyChart and know their login information - If Apple, go to [employer] and type in MyChart in the search bar and download the app. If Android, ask patient to go to [employer] and type in [place] in the search bar and download the app. The app is free but as with any other app downloads, their phone may require them to verify saved payment information or  Apple/Android password.  - The patient will need to then log into the app with their MyChart username and password, and select [facility] as their healthcare provider to link the account. When it is time for your visit, go to the MyChart app, find appointments, and click Begin Video Visit. Be sure to Select Allow for your device to access the Microphone and Camera for your visit. You will then be connected, and your provider will be with you shortly.  **If they have any issues connecting, or need assistance please contact MyChart service desk [phone])**  **If using a computer, in order to ensure the best quality for their visit they will need to use either of the following Internet Browsers: [employer], or Google Chrome**  IF USING DOXIMITY or DOXY.ME - The patient will receive a link just prior to their visit by text.     FULL LENGTH CONSENT FOR TELE-HEALTH VISIT   I hereby voluntarily request, consent and authorize [facility] and its employed or contracted physicians, physician assistants, nurse practitioners or other licensed health care professionals (the Practitioner), to provide me with telemedicine health care services (the Services") as deemed necessary by the treating Practitioner. I acknowledge and consent to receive the Services by the Practitioner via telemedicine. I understand that the telemedicine visit will involve communicating with the Practitioner through live audiovisual communication technology and the disclosure of certain medical information by electronic transmission. I acknowledge that I have been given the opportunity to request an in-person assessment or other available alternative prior to the telemedicine visit and am voluntarily participating in the telemedicine visit.  I understand that I have the right to withhold or withdraw my consent to the use of telemedicine in the course of my care at any time, without affecting my right to future care  or treatment, and that the Practitioner or I may terminate the telemedicine visit at any time. I understand that I have the right to inspect all information obtained and/or recorded in the course of the telemedicine visit and may receive copies of available information for a reasonable fee.  I understand that some of the potential risks of receiving the Services via telemedicine include:   Delay or interruption in medical evaluation due to technological equipment failure or disruption;  Information transmitted may not be sufficient (e.g. poor resolution of images) to allow for appropriate medical decision making by the Practitioner; and/or   In rare instances, security protocols could fail, causing a breach of personal health information.  Furthermore, I acknowledge that it is my responsibility to provide information about my medical history, conditions and care that is complete and accurate to the best of my ability. I acknowledge that Practitioner's advice, recommendations, and/or decision may be based on factors not within their control, such as incomplete or inaccurate data provided by me or distortions of diagnostic images or specimens that may result from electronic transmissions. I  understand that the practice of medicine is not an exact science and that Practitioner makes no warranties or guarantees regarding treatment outcomes. I acknowledge that I will receive a copy of this consent concurrently upon execution via email to the email address I last provided but may also request a printed copy by calling the office of [facility].    I understand that my insurance will be billed for this visit.   I have read or had this consent read to me.  I understand the contents of this consent, which adequately explains the benefits and risks of the Services being provided via telemedicine.   I have been provided ample opportunity to ask questions regarding this consent and the Services and have had  my questions answered to my satisfaction.  I give my informed consent for the services to be provided through the use of telemedicine in my medical care  By participating in this telemedicine visit I agree to the above.

## 2018-09-21 ENCOUNTER — Ambulatory Visit: Payer: Medicare Other | Admitting: Cardiology

## 2018-09-26 DIAGNOSIS — Z5189 Encounter for other specified aftercare: Secondary | ICD-10-CM | POA: Insufficient documentation

## 2018-10-02 DIAGNOSIS — G4733 Obstructive sleep apnea (adult) (pediatric): Secondary | ICD-10-CM | POA: Diagnosis not present

## 2018-10-05 ENCOUNTER — Other Ambulatory Visit: Payer: Self-pay | Admitting: Family Medicine

## 2018-10-18 ENCOUNTER — Encounter: Payer: Self-pay | Admitting: Cardiology

## 2018-10-18 ENCOUNTER — Telehealth (INDEPENDENT_AMBULATORY_CARE_PROVIDER_SITE_OTHER): Payer: Medicare Other | Admitting: Cardiology

## 2018-10-18 VITALS — BP 173/80 | HR 72 | Temp 97.6°F | Ht 66.0 in | Wt 227.0 lb

## 2018-10-18 DIAGNOSIS — I10 Essential (primary) hypertension: Secondary | ICD-10-CM

## 2018-10-18 DIAGNOSIS — I350 Nonrheumatic aortic (valve) stenosis: Secondary | ICD-10-CM | POA: Diagnosis not present

## 2018-10-18 DIAGNOSIS — E782 Mixed hyperlipidemia: Secondary | ICD-10-CM | POA: Diagnosis not present

## 2018-10-18 DIAGNOSIS — I25119 Atherosclerotic heart disease of native coronary artery with unspecified angina pectoris: Secondary | ICD-10-CM | POA: Diagnosis not present

## 2018-10-18 NOTE — Patient Instructions (Addendum)

## 2018-10-18 NOTE — Progress Notes (Signed)
Virtual Visit via Telephone Note   This visit type was conducted due to national recommendations for restrictions regarding the COVID-19 Pandemic (e.g. social distancing) in an effort to limit this patient's exposure and mitigate transmission in our community.  Due to his co-morbid illnesses, this patient is at least at moderate risk for complications without adequate follow up.  This format is felt to be most appropriate for this patient at this time.  The patient did not have access to video technology/had technical difficulties with video requiring transitioning to audio format only (telephone).  All issues noted in this document were discussed and addressed.  No physical exam could be performed with this format.  Please refer to the patient's chart for his  consent to telehealth for [facility].   Date:  10/18/2018   ID:  [name], DOB January 16, 1940, MRN [id number]  Patient Location: Home Provider Location: Office  PCP:  Dettinger, Fransisca Kaufmann, MD  Cardiologist:  Rozann Lesches, MD Electrophysiologist:  None   Evaluation Performed:  Follow-Up Visit  Chief Complaint:   Cardiac follow-up  History of Present Illness:    [name] is a 78 y.o. male last seen in March.  We spoke by phone today.  He tells me that he has been doing well without any active angina symptoms or nitroglycerin use.  Still enjoys working outside on his property, has NYHA class II dyspnea with typical activities.  I reviewed interval lab work as outlined below.  LDL was 55 on Lipitor.  Follow-up echocardiogram obtained in June revealed LVEF greater than 65% with evidence of mild to moderate calcific aortic stenosis.  We discussed the results, I would anticipate a follow-up echocardiogram next year.  He underwent left total hip arthroplasty with Dr. Wynelle Link in June.  The patient does not have symptoms concerning for COVID-19 infection (fever, chills, cough, or new shortness of breath).    Past  Medical History:  Diagnosis Date  . Anxiety disorder   . Aortic atherosclerosis ([place]) 12/19/2017  . Arthritis   . Bursitis of hip   . Cervical spondylosis   . Chronic headaches   . Coronary atherosclerosis of native coronary artery    BMS nondominant RCA 12/2004  . Essential hypertension   . GERD (gastroesophageal reflux disease)   . History of adenomatous polyp of colon   . History of kidney stones   . History of MI (myocardial infarction) 12/2004  . History of viral meningitis 02/28/2018   December 2019  . Hyperlipidemia   . Internal hemorrhoids   . Low back pain   . Nocturia more than twice per night   . Numbness of left foot   . OSA (obstructive sleep apnea)   . Spinal stenosis   . Type 2 diabetes mellitus treated with insulin ([place])   . Urgency of urination   . Wears dentures    upper   Past Surgical History:  Procedure Laterality Date  . CARDIAC CATHETERIZATION  06-14-2004  dr Lia Foyer   total occlusion mD1, RI 30-40%, mRCA nondominant hazy 80% and scattered 30-40%,  ef 71% (medical mangement)  . CARDIOVASCULAR STRESS TEST  09-17-2015   dr Domenic Polite   Low risk nuclear study w/ reversible mild small anteroapical defect,  normal LV function and wall motion, nuclear stress ef 61%  . CATARACT EXTRACTION W/ INTRAOCULAR LENS IMPLANT Right 07/2014  . CHOLECYSTECTOMY    . COLONOSCOPY  09/10/2008   normal  . CORONARY ANGIOPLASTY WITH STENT PLACEMENT  12-17-2004  dr benismhon/ dr Olevia Perches   nonobstructive cad LAD and LCFx,  BMS x1 to total occlusion RCA   . CYSTOSCOPY W/ URETEROSCOPY W/ LITHOTRIPSY  08/2010  . ERCP  03/14/2008  . LAMINECTOMY    . LAPAROSCOPIC CHOLECYSTECTOMY  05/2012  . LEFT HEART CATHETERIZATION WITH CORONARY ANGIOGRAM N/A 05/14/2011   Procedure: LEFT HEART CATHETERIZATION WITH CORONARY ANGIOGRAM;  Surgeon: Sherren Mocha, MD;  Location: [facility] CATH LAB;  Service: Cardiovascular;  Laterality: N/A;    non-obstructive LM, LAD, LCFx, patent RCA stent  . LUMBAR  LAMINECTOMY/DECOMPRESSION MICRODISCECTOMY N/A 02/24/2017   Procedure: Microlumbar decompression L4-5, L5-S1;  Surgeon: Susa Day, MD;  Location: [facility] ORS;  Service: Orthopedics;  Laterality: N/A;  120 mins  . ROTATOR CUFF REPAIR Right 03/2002  . ROTATOR CUFF REPAIR Left 12/11/2015  . TOTAL HIP ARTHROPLASTY Right 12/20/2008  . TOTAL HIP ARTHROPLASTY Left 07/05/2018   Procedure: TOTAL HIP ARTHROPLASTY ANTERIOR APPROACH;  Surgeon: Gaynelle Arabian, MD;  Location: [facility] ORS;  Service: Orthopedics;  Laterality: Left;  167min  . TOTAL KNEE ARTHROPLASTY Bilateral left 12-11-2003/  right 07-31-2004   dr Wynelle Link [facility]  . UPPER GASTROINTESTINAL ENDOSCOPY  01/08/2008   bx, inlet patch, duodenitis  . YAG LASER APPLICATION Right [id number]   Procedure: YAG LASER APPLICATION;  Surgeon: Williams Che, MD;  Location: [facility] ORS;  Service: Ophthalmology;  Laterality: Right;     Current Meds  Medication Sig  . acetaminophen (TYLENOL) 650 MG CR tablet Take 1,300 mg by mouth every 8 (eight) hours as needed for pain.   Marland Kitchen amLODipine (NORVASC) 5 MG tablet TAKE 1 TABLET BY MOUTH  DAILY  . aspirin EC 81 MG tablet Take 81 mg by mouth daily.  Marland Kitchen atorvastatin (LIPITOR) 20 MG tablet Take 1 tablet (20 mg total) by mouth every evening.  . cetirizine (ZYRTEC) 10 MG tablet Take 10 mg by mouth every evening.   . Cholecalciferol (VITAMIN D3) 50 MCG (2000 UT) capsule Take 2,000 Units by mouth daily.  Marland Kitchen docusate sodium (COLACE) 100 MG capsule Take 100 mg by mouth 2 (two) times daily.  . famotidine (PEPCID) 20 MG tablet Take 1 tablet (20 mg total) by mouth 2 (two) times daily with a meal.  . HUMALOG KWIKPEN 100 UNIT/ML KwikPen INJECT 0 TO 15 UNITS  SUBCUTANEOUSLY 3 TIMES  DAILY BEFORE MEALS  (70-150=8 UNITS, 151-200=9  UNITS)  . LANTUS SOLOSTAR 100 UNIT/ML Solostar Pen INJECT SUBCUTANEOUSLY 52  UNITS AT BEDTIME (Patient taking differently: 52 Units at bedtime. )  . losartan (COZAAR) 50 MG tablet TAKE 1 TABLET BY MOUTH  DAILY (Patient  taking differently: Take 50 mg by mouth daily. )  . metFORMIN (GLUCOPHAGE-XR) 500 MG 24 hr tablet TAKE 2 TABLETS BY MOUTH TWO TIMES DAILY (Patient taking differently: Take 1,000 mg by mouth 2 (two) times daily. )  . mirabegron ER (MYRBETRIQ) 50 MG TB24 tablet Take 50 mg by mouth daily.  . nitroGLYCERIN (NITROSTAT) 0.4 MG SL tablet DISSOLVE ONE TABLET UNDER THE TONGUE EVERY 5 MINUTES AS NEEDED FOR CHEST PAIN.  DO NOT EXCEED A TOTAL OF 3 DOSES IN 15 MINUTES (Patient taking differently: Place 0.4 mg under the tongue every 5 (five) minutes as needed. DISSOLVE ONE TABLET UNDER THE TONGUE EVERY 5 MINUTES AS NEEDED FOR CHEST PAIN.  DO NOT EXCEED A TOTAL OF 3 DOSES IN 15 MINUTES)  . PARoxetine (PAXIL) 20 MG tablet Take 20 mg by mouth daily.  . tamsulosin (FLOMAX) 0.4 MG CAPS capsule Take 0.4 mg by mouth daily.  Marland Kitchen  vitamin B-12 (CYANOCOBALAMIN) 1000 MCG tablet Take 2,000 mcg by mouth daily.  . [DISCONTINUED] PARoxetine (PAXIL) 20 MG tablet Take 0.5 tablets (10 mg total) by mouth daily. (Patient taking differently: Take 20 mg by mouth at bedtime. )     Allergies:   Ace inhibitors, Angiotensin receptor blockers, Ciprofloxacin, Oxycodone-acetaminophen, Robaxin [methocarbamol], Toradol [ketorolac tromethamine], Valium, and Doxycycline   Social History   Tobacco Use  . Smoking status: Former Smoker    Packs/day: 1.00    Years: 20.00    Pack years: 20.00    Types: Cigarettes    Start date: 01/18/1963    Quit date: 08/11/1988    Years since quitting: 30.2  . Smokeless tobacco: Former Systems developer    Types: Chew    Quit date: 01/11/1989  . Tobacco comment: chewed 1 pack tobacco/day for 15 years  Substance Use Topics  . Alcohol use: No    Alcohol/week: 0.0 standard drinks    Frequency: Never  . Drug use: No     Family Hx: The patient's family history includes Bladder Cancer in his brother; Brain cancer in his sister; Breast cancer in his sister; Cancer (age of onset: 52) in his mother; Cancer - Other in his  brother; Colon cancer in his mother; Heart attack in his father; Heart disease in his brother, brother, and father.  ROS:   Please see the history of present illness.    Low back pain. All other systems reviewed and are negative.   Prior CV studies:   The following studies were reviewed today:  Lexiscan Myoview 09/17/2015:  No diagnostic ST segment changes to indicate ischemia.  Small, mild intensity, reversible anteroapical defect consistent with a small region of ischemia.  This is a low risk study.  Nuclear stress EF: 61%.  Echocardiogram 06/21/2018:  1. The left ventricle has hyperdynamic systolic function, with an ejection fraction of >65%. The cavity size was normal. There is moderately increased left ventricular wall thickness. Left ventricular diastolic Doppler parameters are consistent with  impaired relaxation.  2. The right ventricle has normal systolic function. The cavity was normal. There is no increase in right ventricular wall thickness.  3. The aortic valve is tricuspid. Severely thickening of the aortic valve. Severe calcifcation of the aortic valve. Mild-moderate stenosis of the aortic valve. Severe aortic annular calcification noted.  4. The mitral valve is abnormal. Mild thickening of the mitral valve leaflet. Mild calcification of the mitral valve leaflet. There is mild mitral annular calcification present. No evidence of mitral valve stenosis.  5. There is mild dilatation of the aortic root measuring 41 mm.  6. Pulmonary hypertension is indeterminant, inadequate TR jet.  Labs/Other Tests and Data Reviewed:    EKG:  An ECG dated 12/18/2017 was personally reviewed today and demonstrated:  Sinus rhythm.  Recent Labs: 12/15/2017: TSH 0.930 12/18/2017: Magnesium 1.9 08/25/2018: ALT 12; BUN 15; Creatinine, Ser 0.97; Hemoglobin 12.0; Platelets 379; Potassium 4.5; Sodium 139   Recent Lipid Panel Lab Results  Component Value Date/Time   CHOL 112 08/25/2018 09:20 AM    CHOL 139 06/02/2012 10:07 AM   TRIG 102 08/25/2018 09:20 AM   TRIG 185 (H) 04/10/2013 09:37 AM   TRIG 107 06/02/2012 10:07 AM   HDL 37 (L) 08/25/2018 09:20 AM   HDL 34 (L) 04/10/2013 09:37 AM   HDL 40 06/02/2012 10:07 AM   CHOLHDL 3.0 08/25/2018 09:20 AM   CHOLHDL 3.8 12/15/2017 05:52 AM   LDLCALC 55 08/25/2018 09:20 AM   LDLCALC 62  04/10/2013 09:37 AM   LDLCALC 78 06/02/2012 10:07 AM    Wt Readings from Last 3 Encounters:  10/18/18 227 lb (103 kg)  08/25/18 227 lb 6.4 oz (103.1 kg)  08/15/18 225 lb 9.6 oz (102.3 kg)     Objective:    Vital Signs:  BP (!) 173/80   Pulse 72   Temp 97.6 F (36.4 C)   Ht 5\' 6"  (1.676 m)   Wt 227 lb (103 kg)   BMI 36.64 kg/m    Patient spoke in full sentences, not short of breath. No audible wheezing or coughing. Speech pattern normal.  ASSESSMENT & PLAN:    1.  CAD status post BMS to nondominant RCA in 2006.  May continue medical therapy and observation in the absence of progressive angina symptoms.  Last ischemic testing was in 2017 as outlined above.  No changes made to present regimen.  2.  Mild to moderate calcific aortic stenosis by echocardiogram in June.  He is asymptomatic.  We will plan a follow-up echocardiogram for next June.  LVEF is normal range.  3.  Mixed hyperlipidemia.  He continues on Lipitor with recent LDL 55.  4.  Essential hypertension, blood pressure is elevated today.  He reports compliance with his medications, recommended follow-up with PCP.  He has been having some lower back pain which may be also contributing to his elevated blood pressure.  COVID-19 Education: The signs and symptoms of COVID-19 were discussed with the patient and how to seek care for testing (follow up with PCP or arrange E-visit).  The importance of social distancing was discussed today.  Time:   Today, I have spent 5 minutes with the patient with telehealth technology discussing the above problems.     Medication Adjustments/Labs and  Tests Ordered: Current medicines are reviewed at length with the patient today.  Concerns regarding medicines are outlined above.   Tests Ordered: No orders of the defined types were placed in this encounter.   Medication Changes: No orders of the defined types were placed in this encounter.   Follow Up:  In Person 6 months in the [place] office.  Signed, Rozann Lesches, MD  10/18/2018 9:54 AM    [place]

## 2018-10-31 DIAGNOSIS — G4733 Obstructive sleep apnea (adult) (pediatric): Secondary | ICD-10-CM | POA: Diagnosis not present

## 2018-11-10 DIAGNOSIS — M545 Low back pain: Secondary | ICD-10-CM | POA: Diagnosis not present

## 2018-11-10 DIAGNOSIS — M48061 Spinal stenosis, lumbar region without neurogenic claudication: Secondary | ICD-10-CM | POA: Diagnosis not present

## 2018-11-13 ENCOUNTER — Other Ambulatory Visit: Payer: Self-pay | Admitting: Cardiology

## 2018-11-22 ENCOUNTER — Telehealth: Payer: Self-pay | Admitting: Family Medicine

## 2018-11-22 ENCOUNTER — Other Ambulatory Visit: Payer: Self-pay | Admitting: *Deleted

## 2018-11-22 DIAGNOSIS — R350 Frequency of micturition: Secondary | ICD-10-CM | POA: Diagnosis not present

## 2018-11-22 MED ORDER — ONETOUCH VERIO VI STRP: ORAL_STRIP | 12 refills | Status: DC

## 2018-11-22 NOTE — Chronic Care Management (AMB) (Signed)
°  Chronic Care Management   Outreach Note  11/22/2018 Name: [name] MRN: [id number] DOB: 08/13/1940  Referred by: Dettinger, Fransisca Kaufmann, MD Reason for referral : Chronic Care Management (unsuccessful.)   An unsuccessful telephone outreach was attempted today. The patient was referred to the case management team by for assistance with care management and care coordination.   Follow Up Plan: A HIPPA compliant phone message was left for the patient providing contact information and requesting a return call.  The care management team will reach out to the patient again over the next 7 days.  If patient returns call to provider office, please advise to call [place] at [place]  [place], [place] [address] Direct Dial: [email or web address]  Website: [email or web address]

## 2018-11-23 ENCOUNTER — Other Ambulatory Visit: Payer: Self-pay | Admitting: *Deleted

## 2018-11-23 MED ORDER — GLUCOSE BLOOD VI STRP: ORAL_STRIP | 3 refills | Status: DC

## 2018-11-24 DIAGNOSIS — M545 Low back pain: Secondary | ICD-10-CM | POA: Diagnosis not present

## 2018-11-28 ENCOUNTER — Other Ambulatory Visit: Payer: Self-pay

## 2018-11-28 NOTE — Chronic Care Management (AMB) (Signed)
Chronic Care Management   Note  11/28/2018 Name: KRISHIV SANDLER MRN: [id number] DOB: 26-Mar-1940  Jackquline Berlin Statler is a 78 y.o. year old male who is a primary care patient of Dettinger, Fransisca Kaufmann, MD. I reached out to [name] by phone today in response to a referral sent by Mr. Treg Diemer [facility] health plan.     Mr. Kreiter was given information about Chronic Care Management services today including:  1. CCM service includes personalized support from designated clinical staff supervised by his physician, including individualized plan of care and coordination with other care providers 2. 24/7 contact phone numbers for assistance for urgent and routine care needs. 3. Service will only be billed when office clinical staff spend 20 minutes or more in a month to coordinate care. 4. Only one practitioner may furnish and bill the service in a calendar month. 5. The patient may stop CCM services at any time (effective at the end of the month) by phone call to the office staff. 6. The patient will be responsible for cost sharing (co-pay) of up to 20% of the service fee (after annual deductible is met).  Patient did not agree to services and wishes to discuss with Dr. Warrick Parisian before deciding about enrollment in care management services.   Follow up plan: The care management team is available to follow up with the patient after provider conversation with the patient regarding recommendation for care management engagement and subsequent re-referral to the care management team.   [place], [place], [place] [address] Direct Dial: [email or web address]  Website: [email or web address]

## 2018-11-29 ENCOUNTER — Encounter: Payer: Self-pay | Admitting: Family Medicine

## 2018-11-29 ENCOUNTER — Ambulatory Visit (INDEPENDENT_AMBULATORY_CARE_PROVIDER_SITE_OTHER): Payer: Medicare Other | Admitting: Family Medicine

## 2018-11-29 VITALS — BP 145/69 | HR 73 | Temp 98.0°F | Ht 66.0 in | Wt 231.4 lb

## 2018-11-29 DIAGNOSIS — E1159 Type 2 diabetes mellitus with other circulatory complications: Secondary | ICD-10-CM

## 2018-11-29 DIAGNOSIS — K219 Gastro-esophageal reflux disease without esophagitis: Secondary | ICD-10-CM | POA: Diagnosis not present

## 2018-11-29 DIAGNOSIS — I10 Essential (primary) hypertension: Secondary | ICD-10-CM | POA: Diagnosis not present

## 2018-11-29 DIAGNOSIS — E785 Hyperlipidemia, unspecified: Secondary | ICD-10-CM

## 2018-11-29 DIAGNOSIS — Z794 Long term (current) use of insulin: Secondary | ICD-10-CM

## 2018-11-29 DIAGNOSIS — I152 Hypertension secondary to endocrine disorders: Secondary | ICD-10-CM

## 2018-11-29 DIAGNOSIS — E1169 Type 2 diabetes mellitus with other specified complication: Secondary | ICD-10-CM | POA: Diagnosis not present

## 2018-11-29 DIAGNOSIS — N3281 Overactive bladder: Secondary | ICD-10-CM | POA: Insufficient documentation

## 2018-11-29 LAB — BAYER DCA HB A1C WAIVED: HB A1C (BAYER DCA - WAIVED): 7.5 % — ABNORMAL HIGH (ref <7.0)

## 2018-11-29 NOTE — Patient Instructions (Signed)
Increase Lantus to 55 units at bedtime

## 2018-11-29 NOTE — Progress Notes (Addendum)
BP (!) 145/69   Pulse 73   Temp 98 F (36.7 C) (Temporal)   Ht 5\' 6"  (1.676 m)   Wt 231 lb 6.4 oz (105 kg)   SpO2 97%   BMI 37.35 kg/m    Subjective:   Patient ID: [name], male    DOB: [birth year], 78 y.o.   MRN: [id number]  HPI: [name] is a 78 y.o. male presenting on 11/29/2018 for Diabetes (3 month follow up)   HPI Type 2 diabetes mellitus Patient comes in today for recheck of his diabetes. Patient has been currently taking Lantus and Humalog and metformin. Patient is currently on an ACE inhibitor/ARB. Patient has not seen an ophthalmologist this year. Patient denies any issues with their feet.   GERD Patient is currently on famotidine.  She denies any major symptoms or abdominal pain or belching or burping. She denies any blood in her stool or lightheadedness or dizziness.   Hyperlipidemia Patient is coming in for recheck of his hyperlipidemia. The patient is currently taking atorvastatin. They deny any issues with myalgias or history of liver damage from it. They deny any focal numbness or weakness or chest pain.   Hypertension Patient is currently on losartan and amlodipine, and their blood pressure today is 145/69. Patient denies any lightheadedness or dizziness. Patient denies headaches, blurred vision, chest pains, shortness of breath, or weakness. Denies any side effects from medication and is content with current medication.   Relevant past medical, surgical, family and social history reviewed and updated as indicated. Interim medical history since our last visit reviewed. Allergies and medications reviewed and updated.  Review of Systems  Constitutional: Negative for chills and fever.  Respiratory: Negative for shortness of breath and wheezing.   Cardiovascular: Negative for chest pain and leg swelling.  Musculoskeletal: Negative for back pain and gait problem.  Skin: Negative for rash.  Neurological: Negative for dizziness, weakness and  numbness.  All other systems reviewed and are negative.   Per HPI unless specifically indicated above   Allergies as of 11/29/2018      Reactions   Ace Inhibitors Hives, Swelling, Rash   Rash,hives,tongue swelling   Angiotensin Receptor Blockers    Unknown reaction    Ciprofloxacin Other (See Comments)   confusion   Oxycodone-acetaminophen Other (See Comments)   Unknown reaction   Robaxin [methocarbamol] Other (See Comments)   Confusion    Toradol [ketorolac Tromethamine] Other (See Comments)   confusion   Valium Other (See Comments)   Hallucinations; confusion   Doxycycline Hives, Rash      Medication List       Accurate as of November 29, 2018  9:31 AM. If you have any questions, ask your nurse or doctor.        STOP taking these medications   Myrbetriq 50 MG Tb24 tablet Generic drug: mirabegron ER Stopped by: Fransisca Kaufmann Cortavious Nix, MD     TAKE these medications   acetaminophen 650 MG CR tablet Commonly known as: TYLENOL Take 1,300 mg by mouth every 8 (eight) hours as needed for pain.   amLODipine 5 MG tablet Commonly known as: NORVASC TAKE 1 TABLET BY MOUTH  DAILY   aspirin EC 81 MG tablet Take 81 mg by mouth daily.   atorvastatin 20 MG tablet Commonly known as: LIPITOR Take 1 tablet (20 mg total) by mouth every evening.   cetirizine 10 MG tablet Commonly known as: ZYRTEC Take 10 mg by mouth every evening.   docusate  sodium 100 MG capsule Commonly known as: COLACE Take 100 mg by mouth 2 (two) times daily.   famotidine 20 MG tablet Commonly known as: PEPCID Take 1 tablet (20 mg total) by mouth 2 (two) times daily with a meal.   glucose blood test strip Test 4 times daily Dx E11.59   HumaLOG KwikPen 100 UNIT/ML KwikPen Generic drug: insulin lispro INJECT 0 TO 15 UNITS  SUBCUTANEOUSLY 3 TIMES  DAILY BEFORE MEALS  (70-150=8 UNITS, 151-200=9  UNITS) What changed: See the new instructions.   Lantus SoloStar 100 UNIT/ML Solostar Pen Generic drug:  Insulin Glargine INJECT SUBCUTANEOUSLY 52  UNITS AT BEDTIME What changed: See the new instructions.   losartan 50 MG tablet Commonly known as: COZAAR TAKE 1 TABLET BY MOUTH  DAILY   metFORMIN 500 MG 24 hr tablet Commonly known as: GLUCOPHAGE-XR TAKE 2 TABLETS BY MOUTH TWO TIMES DAILY What changed: See the new instructions.   nitroGLYCERIN 0.4 MG SL tablet Commonly known as: NITROSTAT DISSOLVE ONE TABLET UNDER THE TONGUE EVERY 5 MINUTES AS NEEDED FOR CHEST PAIN.  DO NOT EXCEED A TOTAL OF 3 DOSES IN 15 MINUTES NOW   PARoxetine 20 MG tablet Commonly known as: PAXIL Take 20 mg by mouth daily.   solifenacin 10 MG tablet Commonly known as: VESICARE Take 10 mg by mouth daily.   tamsulosin 0.4 MG Caps capsule Commonly known as: FLOMAX Take 0.4 mg by mouth daily.   vitamin B-12 1000 MCG tablet Commonly known as: CYANOCOBALAMIN Take 2,000 mcg by mouth daily.   Vitamin D3 50 MCG (2000 UT) capsule Take 2,000 Units by mouth daily.        Objective:   BP (!) 145/69   Pulse 73   Temp 98 F (36.7 C) (Temporal)   Ht 5\' 6"  (1.676 m)   Wt 231 lb 6.4 oz (105 kg)   SpO2 97%   BMI 37.35 kg/m   Wt Readings from Last 3 Encounters:  11/29/18 231 lb 6.4 oz (105 kg)  10/18/18 227 lb (103 kg)  08/25/18 227 lb 6.4 oz (103.1 kg)    Physical Exam Vitals signs and nursing note reviewed.  Constitutional:      General: He is not in acute distress.    Appearance: He is well-developed. He is not diaphoretic.  Eyes:     General: No scleral icterus.    Conjunctiva/sclera: Conjunctivae normal.     Pupils: Pupils are equal, round, and reactive to light.  Neck:     Thyroid: No thyromegaly.  Cardiovascular:     Rate and Rhythm: Normal rate and regular rhythm.     Heart sounds: Normal heart sounds. No murmur.  Pulmonary:     Effort: Pulmonary effort is normal. No respiratory distress.     Breath sounds: Normal breath sounds. No wheezing.  Musculoskeletal: Normal range of motion.  Skin:     General: Skin is warm and dry.     Findings: No rash.  Neurological:     Mental Status: He is alert and oriented to person, place, and time.     Coordination: Coordination normal.  Psychiatric:        Behavior: Behavior normal.       Assessment & Plan:   Problem List Items Addressed This Visit      Cardiovascular and Mediastinum   Hypertension associated with diabetes ([place])     Digestive   GERD (gastroesophageal reflux disease)     Endocrine   Hyperlipidemia associated with type 2 diabetes  mellitus ([place])   Type 2 diabetes mellitus with circulatory disorder ([place]) - Primary   Relevant Orders   hgba1c (Completed)   Microalbumin / creatinine urine ratio (Completed)      Increase Lantus to 55 units nightly  A1c is 7.5 which is up.  This office visit was a visit to discuss patient's diabetic management and because she is out of control and using insulin 4 times daily and having to check her blood sugars 4 times daily I believe she would be a good candidate for a continuous subcutaneous glucose monitor such as freestyle libre.  Follow up plan: Return in about 3 months (around 03/01/2019), or if symptoms worsen or fail to improve, for Diabetes and hypertension.  Counseling provided for all of the vaccine components Orders Placed This Encounter  Procedures  . hgba1c  . Microalbumin / creatinine urine ratio    Caryl Pina, MD [facility] 11/29/2018, 9:31 AM

## 2018-11-30 DIAGNOSIS — M545 Low back pain: Secondary | ICD-10-CM | POA: Diagnosis not present

## 2018-11-30 LAB — MICROALBUMIN / CREATININE URINE RATIO
Creatinine, Urine: 105.2 mg/dL
Microalb/Creat Ratio: 263 mg/g creat — ABNORMAL HIGH (ref 0–29)
Microalbumin, Urine: 276.3 ug/mL

## 2018-12-03 ENCOUNTER — Other Ambulatory Visit: Payer: Self-pay | Admitting: Family Medicine

## 2018-12-16 ENCOUNTER — Other Ambulatory Visit: Payer: Self-pay | Admitting: Family Medicine

## 2018-12-21 DIAGNOSIS — M47816 Spondylosis without myelopathy or radiculopathy, lumbar region: Secondary | ICD-10-CM | POA: Diagnosis not present

## 2019-01-01 DIAGNOSIS — R3915 Urgency of urination: Secondary | ICD-10-CM | POA: Diagnosis not present

## 2019-01-01 DIAGNOSIS — R350 Frequency of micturition: Secondary | ICD-10-CM | POA: Diagnosis not present

## 2019-01-11 DIAGNOSIS — M47816 Spondylosis without myelopathy or radiculopathy, lumbar region: Secondary | ICD-10-CM | POA: Diagnosis not present

## 2019-01-15 DIAGNOSIS — G4733 Obstructive sleep apnea (adult) (pediatric): Secondary | ICD-10-CM | POA: Diagnosis not present

## 2019-01-25 ENCOUNTER — Encounter: Payer: Self-pay | Admitting: Family Medicine

## 2019-01-26 ENCOUNTER — Telehealth: Payer: Self-pay | Admitting: Family Medicine

## 2019-01-26 NOTE — Telephone Encounter (Signed)
Patient is sick with covid having headache and other symptoms.  Requesting a refill on butabital aceta caffeine ,50 325 40 ,BID , (listed in medication history),send to [place] , [place].    Would it be helpful to take mucinex and extra vitamin D 3?  What would be a therapeutic dose of vitamin D?

## 2019-01-26 NOTE — Telephone Encounter (Signed)
Patient aware and verbalizes understanding - states that he will wait until his visit in Feb.

## 2019-01-26 NOTE — Telephone Encounter (Signed)
Oh sorry I did not see that part, he can take vitamin D 4 to 5000 international units daily and he can take the Mucinex with it.  Yes this can be a televisit

## 2019-01-26 NOTE — Telephone Encounter (Signed)
What is the name of the medication? Headache Medication  Have you contacted your pharmacy to request a refill? Yes  Which pharmacy would you like this sent to? [place], [place]  Patient recently tested positive for COVID and wife called stating that he is having headaches, fever, some congestion, and cold symptoms. Says he is taking tylenol, and his headache Rx but is almost out of his headache Rx. Requested for it to be refilled.   Patient notified that their request is being sent to the clinical staff for review and that they should receive a call once it is complete. If they do not receive a call within 24 hours they can check with their pharmacy or our office.

## 2019-01-26 NOTE — Telephone Encounter (Signed)
Please address other questions in note from nurse. Also = could this be a TELE visit - he has covid.

## 2019-01-26 NOTE — Telephone Encounter (Signed)
Unfortunately butalbital is a controlled substance now and we can no longer prescribe outside of a visit, we can schedule him for a phone visit.

## 2019-01-30 DIAGNOSIS — G4733 Obstructive sleep apnea (adult) (pediatric): Secondary | ICD-10-CM | POA: Diagnosis not present

## 2019-02-05 ENCOUNTER — Encounter (HOSPITAL_COMMUNITY): Payer: Self-pay | Admitting: *Deleted

## 2019-02-05 ENCOUNTER — Other Ambulatory Visit: Payer: Self-pay

## 2019-02-05 ENCOUNTER — Emergency Department (HOSPITAL_COMMUNITY)
Admission: EM | Admit: 2019-02-05 | Discharge: 2019-02-05 | Disposition: A | Discharge status: Home / Self Care | Payer: Medicare Other | Attending: Emergency Medicine | Admitting: Emergency Medicine

## 2019-02-05 DIAGNOSIS — E119 Type 2 diabetes mellitus without complications: Secondary | ICD-10-CM | POA: Insufficient documentation

## 2019-02-05 DIAGNOSIS — Z96643 Presence of artificial hip joint, bilateral: Secondary | ICD-10-CM | POA: Insufficient documentation

## 2019-02-05 DIAGNOSIS — Z79899 Other long term (current) drug therapy: Secondary | ICD-10-CM | POA: Insufficient documentation

## 2019-02-05 DIAGNOSIS — Y999 Unspecified external cause status: Secondary | ICD-10-CM | POA: Diagnosis not present

## 2019-02-05 DIAGNOSIS — I2510 Atherosclerotic heart disease of native coronary artery without angina pectoris: Secondary | ICD-10-CM | POA: Diagnosis not present

## 2019-02-05 DIAGNOSIS — I10 Essential (primary) hypertension: Secondary | ICD-10-CM | POA: Insufficient documentation

## 2019-02-05 DIAGNOSIS — Z7984 Long term (current) use of oral hypoglycemic drugs: Secondary | ICD-10-CM | POA: Diagnosis not present

## 2019-02-05 DIAGNOSIS — S161XXA Strain of muscle, fascia and tendon at neck level, initial encounter: Secondary | ICD-10-CM | POA: Insufficient documentation

## 2019-02-05 DIAGNOSIS — Y939 Activity, unspecified: Secondary | ICD-10-CM | POA: Insufficient documentation

## 2019-02-05 DIAGNOSIS — X58XXXA Exposure to other specified factors, initial encounter: Secondary | ICD-10-CM | POA: Insufficient documentation

## 2019-02-05 DIAGNOSIS — S199XXA Unspecified injury of neck, initial encounter: Secondary | ICD-10-CM | POA: Diagnosis present

## 2019-02-05 DIAGNOSIS — Y929 Unspecified place or not applicable: Secondary | ICD-10-CM | POA: Diagnosis not present

## 2019-02-05 DIAGNOSIS — Z87891 Personal history of nicotine dependence: Secondary | ICD-10-CM | POA: Insufficient documentation

## 2019-02-05 MED ORDER — LIDOCAINE 5 % EX PTCH
1.0000 | MEDICATED_PATCH | CUTANEOUS | Status: DC
  Administered 2019-02-05: 02:00:00 1 via TRANSDERMAL
  Filled 2019-02-05: qty 1

## 2019-02-05 MED ORDER — CYCLOBENZAPRINE HCL 10 MG PO TABS: 10.0000 mg | ORAL_TABLET | Freq: Two times a day (BID) | ORAL | 0 refills | Status: DC | PRN

## 2019-02-05 MED ORDER — LIDOCAINE 5 % EX PTCH: 1.0000 | MEDICATED_PATCH | CUTANEOUS | 0 refills | Status: DC

## 2019-02-05 MED ORDER — ACETAMINOPHEN 500 MG PO TABS
1000.0000 mg | ORAL_TABLET | Freq: Once | ORAL | Status: AC
  Administered 2019-02-05: 1000 mg via ORAL
  Filled 2019-02-05: qty 2

## 2019-02-05 MED ORDER — CYCLOBENZAPRINE HCL 10 MG PO TABS
10.0000 mg | ORAL_TABLET | Freq: Once | ORAL | Status: AC
  Administered 2019-02-05: 10 mg via ORAL
  Filled 2019-02-05: qty 1

## 2019-02-05 NOTE — Discharge Instructions (Addendum)
You were evaluated in the Emergency Department and after careful evaluation, we did not find any emergent condition requiring admission or further testing in the hospital.  Your exam/testing today is overall reassuring.  We recommend warm compresses, Tylenol, and use of the Lidoderm patches for your pain.  For trouble sleeping at night due to the pain, you can use the Flexeril muscle relaxer.  Please use caution as the muscle relaxer can cause drowsiness.  Please return to the Emergency Department if you experience any worsening of your condition.  We encourage you to follow up with a primary care provider.  Thank you for allowing [place] to be a part of your care.

## 2019-02-05 NOTE — ED Provider Notes (Signed)
[facility] Emergency Department Provider Note MRN:  [id number]  Arrival date & time: 02/05/19     Chief Complaint   Neck Pain   History of Present Illness   [name] is a 79 y.o. year-old male with a history of type 2 diabetes, CAD presenting to the ED with chief complaint of neck pain.  3 or 4 days of gradual onset neck pain.  Located on both sides of the neck, left greater than right.  Seem to be a bit sore upon awakening, slowly worsening since then.  Now with severe pain with any movement of the neck.  No fever, no numbness or weakness to the arms or legs, no bowel or bladder dysfunction.  No chest pain or shortness of breath, no abdominal pain.  Review of Systems  A complete 10 system review of systems was obtained and all systems are negative except as noted in the HPI and PMH.   Patient's Health History    Past Medical History:  Diagnosis Date  . Anxiety disorder   . Aortic atherosclerosis ([place]) 12/19/2017  . Arthritis   . Bursitis of hip   . Cervical spondylosis   . Chronic headaches   . Coronary atherosclerosis of native coronary artery    BMS nondominant RCA 12/2004  . Essential hypertension   . GERD (gastroesophageal reflux disease)   . History of adenomatous polyp of colon   . History of kidney stones   . History of MI (myocardial infarction) 12/2004  . History of viral meningitis 02/28/2018   December 2019  . Hyperlipidemia   . Internal hemorrhoids   . Low back pain   . Nocturia more than twice per night   . Numbness of left foot   . OSA (obstructive sleep apnea)   . Spinal stenosis   . Type 2 diabetes mellitus treated with insulin ([place])   . Urgency of urination   . Wears dentures    upper    Past Surgical History:  Procedure Laterality Date  . CARDIAC CATHETERIZATION  06-14-2004  dr Lia Foyer   total occlusion mD1, RI 30-40%, mRCA nondominant hazy 80% and scattered 30-40%,  ef 71% (medical mangement)  . CARDIOVASCULAR  STRESS TEST  09-17-2015   dr Domenic Polite   Low risk nuclear study w/ reversible mild small anteroapical defect,  normal LV function and wall motion, nuclear stress ef 61%  . CATARACT EXTRACTION W/ INTRAOCULAR LENS IMPLANT Right 07/2014  . CHOLECYSTECTOMY    . COLONOSCOPY  09/10/2008   normal  . CORONARY ANGIOPLASTY WITH STENT PLACEMENT  12-17-2004   dr benismhon/ dr Olevia Perches   nonobstructive cad LAD and LCFx,  BMS x1 to total occlusion RCA   . CYSTOSCOPY W/ URETEROSCOPY W/ LITHOTRIPSY  08/2010  . ERCP  03/14/2008  . LAMINECTOMY    . LAPAROSCOPIC CHOLECYSTECTOMY  05/2012  . LEFT HEART CATHETERIZATION WITH CORONARY ANGIOGRAM N/A 05/14/2011   Procedure: LEFT HEART CATHETERIZATION WITH CORONARY ANGIOGRAM;  Surgeon: Sherren Mocha, MD;  Location: [facility] CATH LAB;  Service: Cardiovascular;  Laterality: N/A;    non-obstructive LM, LAD, LCFx, patent RCA stent  . LUMBAR LAMINECTOMY/DECOMPRESSION MICRODISCECTOMY N/A 02/24/2017   Procedure: Microlumbar decompression L4-5, L5-S1;  Surgeon: Susa Day, MD;  Location: [facility] ORS;  Service: Orthopedics;  Laterality: N/A;  120 mins  . ROTATOR CUFF REPAIR Right 03/2002  . ROTATOR CUFF REPAIR Left 12/11/2015  . TOTAL HIP ARTHROPLASTY Right 12/20/2008  . TOTAL HIP ARTHROPLASTY Left 07/05/2018   Procedure: TOTAL HIP ARTHROPLASTY  ANTERIOR APPROACH;  Surgeon: Gaynelle Arabian, MD;  Location: [facility] ORS;  Service: Orthopedics;  Laterality: Left;  112min  . TOTAL KNEE ARTHROPLASTY Bilateral left 12-11-2003/  right 07-31-2004   dr Wynelle Link [facility]  . UPPER GASTROINTESTINAL ENDOSCOPY  01/08/2008   bx, inlet patch, duodenitis  . YAG LASER APPLICATION Right [id number]   Procedure: YAG LASER APPLICATION;  Surgeon: Williams Che, MD;  Location: [facility] ORS;  Service: Ophthalmology;  Laterality: Right;    Family History  Problem Relation Age of Onset  . Colon cancer Mother        Diagnosed age 74  . Cancer Mother 64  . Heart disease Father   . Heart attack Father   . Breast cancer Sister     . Heart disease Brother   . Brain cancer Sister   . Heart disease Brother   . Bladder Cancer Brother   . Cancer - Other Brother     Social History   Socioeconomic History  . Marital status: Married    Spouse name: Diane   . Number of children: 1  . Years of education: HS  . Highest education level: High school graduate  Occupational History  . Occupation: RETIRED    Employer: [employer]    Comment: Power plant  Tobacco Use  . Smoking status: Former Smoker    Packs/day: 1.00    Years: 20.00    Pack years: 20.00    Types: Cigarettes    Start date: 01/18/1963    Quit date: 08/11/1988    Years since quitting: 30.5  . Smokeless tobacco: Former Systems developer    Types: Chew    Quit date: 01/11/1989  . Tobacco comment: chewed 1 pack tobacco/day for 15 years  Substance and Sexual Activity  . Alcohol use: No    Alcohol/week: 0.0 standard drinks  . Drug use: No  . Sexual activity: Yes  Other Topics Concern  . Not on file  Social History Narrative   No regular exercise      Daily caffeine use: 3 cups   Patient is right handed.    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 Difficulty of Paying Living Expenses: Not on file  Food Insecurity:   . Worried About Charity fundraiser in the Last Year: Not on file  . Ran Out of Food in the Last Year: Not on file  Transportation Needs:   . Lack of Transportation (Medical): Not on file  . Lack of Transportation (Non-Medical): Not on file  Physical Activity: Inactive  . Days of Exercise per Week: 0 days  . Minutes of Exercise per Session: 0 min  Stress: No Stress Concern Present  . Feeling of Stress : Only a little  Social Connections: Unknown  . Frequency of Communication with Friends and Family: Not on file  . Frequency of Social Gatherings with Friends and Family: Not on file  . Attends Religious Services:  1 to 4 times per year  . Active Member of Clubs or Organizations: Not on file  . Attends Archivist Meetings: Not on file  . Marital Status: Not on file  Intimate Partner Violence:   . Fear of Current or Ex-Partner: Not on file  . Emotionally Abused: Not on file  . Physically Abused: Not on file  . Sexually Abused: Not on file  Physical Exam   Vitals:   02/05/19 0120  BP: (!) 180/76  Pulse: 83  Resp: 18  Temp: 99 F (37.2 C)  SpO2: 96%    CONSTITUTIONAL: Well-appearing, NAD NEURO:  Alert and oriented x 3, normal and symmetric strength and sensation, normal coordination, normal speech, no meningismus EYES:  eyes equal and reactive ENT/NECK:  no LAD, no JVD CARDIO: Regular rate, well-perfused, normal S1 and S2 PULM:  CTAB no wheezing or rhonchi GI/GU:  normal bowel sounds, non-distended, non-tender MSK/SPINE:  No gross deformities, no edema; tenderness palpation to the paraspinal neck, left greater than right SKIN:  no rash, atraumatic PSYCH:  Appropriate speech and behavior  *Additional and/or pertinent findings included in MDM below  Diagnostic and Interventional Summary    EKG Interpretation  Date/Time:    Ventricular Rate:    PR Interval:    QRS Duration:   QT Interval:    QTC Calculation:   R Axis:     Text Interpretation:        Cardiac Monitoring Interpretation:  Labs Reviewed - No data to display  No orders to display    Medications  lidocaine (LIDODERM) 5 % 1 patch (1 patch Transdermal Patch Applied 02/05/19 0140)  acetaminophen (TYLENOL) tablet 1,000 mg (1,000 mg Oral Given 02/05/19 0139)  cyclobenzaprine (FLEXERIL) tablet 10 mg (10 mg Oral Given 02/05/19 0139)     Procedures  /  Critical Care Procedures  ED Course and Medical Decision Making  I have reviewed the triage vital signs, the nursing notes, and pertinent available records from the EMR.  Pertinent labs & imaging results that were available during my care of the patient  were reviewed by me and considered in my medical decision making (see below for details).     Consistent with neck sprain or spasm, no red flag symptoms to suggest myelopathy, no meningismus, no fever, normal vital signs, no indication for testing or imaging, appropriate for discharge with pain control.    Barth Kirks. Sedonia Small, [place] [email or web address]  Final Clinical Impressions(s) / ED Diagnoses     ICD-10-CM   1. Strain of neck muscle, initial encounter  S16.1XXA     ED Discharge Orders         Ordered    cyclobenzaprine (FLEXERIL) 10 MG tablet  2 times daily PRN     02/05/19 0145    lidocaine (LIDODERM) 5 %  Every 24 hours     02/05/19 0145           Discharge Instructions Discussed with and Provided to Patient:     Discharge Instructions     You were evaluated in the Emergency Department and after careful evaluation, we did not find any emergent condition requiring admission or further testing in the hospital.  Your exam/testing today is overall reassuring.  We recommend warm compresses, Tylenol, and use of the Lidoderm patches for your pain.  For trouble sleeping at night due to the pain, you can use the Flexeril muscle relaxer.  Please use caution as the muscle relaxer can cause drowsiness.  Please return to the Emergency Department if you experience any worsening of your condition.  We encourage you to follow up with a primary care provider.  Thank you for allowing [place] to be a part of your care.      Maudie Flakes, MD 02/05/19 [phone]

## 2019-02-05 NOTE — ED Triage Notes (Signed)
Pt c/o  neck pain that started a week ago, worse with movement. Denies any injury,

## 2019-02-06 DIAGNOSIS — M9901 Segmental and somatic dysfunction of cervical region: Secondary | ICD-10-CM | POA: Diagnosis not present

## 2019-02-06 DIAGNOSIS — M47812 Spondylosis without myelopathy or radiculopathy, cervical region: Secondary | ICD-10-CM | POA: Diagnosis not present

## 2019-02-06 DIAGNOSIS — M542 Cervicalgia: Secondary | ICD-10-CM | POA: Diagnosis not present

## 2019-02-07 DIAGNOSIS — M47812 Spondylosis without myelopathy or radiculopathy, cervical region: Secondary | ICD-10-CM | POA: Diagnosis not present

## 2019-02-07 DIAGNOSIS — M542 Cervicalgia: Secondary | ICD-10-CM | POA: Diagnosis not present

## 2019-02-07 DIAGNOSIS — M9901 Segmental and somatic dysfunction of cervical region: Secondary | ICD-10-CM | POA: Diagnosis not present

## 2019-02-09 DIAGNOSIS — M9901 Segmental and somatic dysfunction of cervical region: Secondary | ICD-10-CM | POA: Diagnosis not present

## 2019-02-09 DIAGNOSIS — M542 Cervicalgia: Secondary | ICD-10-CM | POA: Diagnosis not present

## 2019-02-09 DIAGNOSIS — M47812 Spondylosis without myelopathy or radiculopathy, cervical region: Secondary | ICD-10-CM | POA: Diagnosis not present

## 2019-02-12 DIAGNOSIS — M47812 Spondylosis without myelopathy or radiculopathy, cervical region: Secondary | ICD-10-CM | POA: Diagnosis not present

## 2019-02-12 DIAGNOSIS — M542 Cervicalgia: Secondary | ICD-10-CM | POA: Diagnosis not present

## 2019-02-12 DIAGNOSIS — M9901 Segmental and somatic dysfunction of cervical region: Secondary | ICD-10-CM | POA: Diagnosis not present

## 2019-02-15 DIAGNOSIS — M47812 Spondylosis without myelopathy or radiculopathy, cervical region: Secondary | ICD-10-CM | POA: Diagnosis not present

## 2019-02-15 DIAGNOSIS — M9901 Segmental and somatic dysfunction of cervical region: Secondary | ICD-10-CM | POA: Diagnosis not present

## 2019-02-15 DIAGNOSIS — M542 Cervicalgia: Secondary | ICD-10-CM | POA: Diagnosis not present

## 2019-02-21 DIAGNOSIS — M48061 Spinal stenosis, lumbar region without neurogenic claudication: Secondary | ICD-10-CM | POA: Diagnosis not present

## 2019-02-21 DIAGNOSIS — E119 Type 2 diabetes mellitus without complications: Secondary | ICD-10-CM | POA: Diagnosis not present

## 2019-02-21 DIAGNOSIS — M545 Low back pain: Secondary | ICD-10-CM | POA: Diagnosis not present

## 2019-02-21 DIAGNOSIS — M5416 Radiculopathy, lumbar region: Secondary | ICD-10-CM | POA: Diagnosis not present

## 2019-02-21 DIAGNOSIS — I700 Atherosclerosis of aorta: Secondary | ICD-10-CM | POA: Diagnosis not present

## 2019-03-01 ENCOUNTER — Ambulatory Visit: Payer: Medicare Other | Admitting: Family Medicine

## 2019-03-16 ENCOUNTER — Other Ambulatory Visit: Payer: Self-pay | Admitting: Family Medicine

## 2019-03-16 DIAGNOSIS — E1129 Type 2 diabetes mellitus with other diabetic kidney complication: Secondary | ICD-10-CM

## 2019-03-16 DIAGNOSIS — I152 Hypertension secondary to endocrine disorders: Secondary | ICD-10-CM

## 2019-03-16 DIAGNOSIS — E1159 Type 2 diabetes mellitus with other circulatory complications: Secondary | ICD-10-CM

## 2019-03-20 ENCOUNTER — Other Ambulatory Visit: Payer: Self-pay

## 2019-03-21 ENCOUNTER — Encounter: Payer: Self-pay | Admitting: Family Medicine

## 2019-03-21 ENCOUNTER — Ambulatory Visit (INDEPENDENT_AMBULATORY_CARE_PROVIDER_SITE_OTHER): Payer: Medicare Other | Admitting: Family Medicine

## 2019-03-21 VITALS — BP 145/70 | HR 71 | Ht 66.0 in | Wt 235.0 lb

## 2019-03-21 DIAGNOSIS — I10 Essential (primary) hypertension: Secondary | ICD-10-CM | POA: Diagnosis not present

## 2019-03-21 DIAGNOSIS — E785 Hyperlipidemia, unspecified: Secondary | ICD-10-CM | POA: Diagnosis not present

## 2019-03-21 DIAGNOSIS — E669 Obesity, unspecified: Secondary | ICD-10-CM | POA: Diagnosis not present

## 2019-03-21 DIAGNOSIS — R413 Other amnesia: Secondary | ICD-10-CM

## 2019-03-21 DIAGNOSIS — I152 Hypertension secondary to endocrine disorders: Secondary | ICD-10-CM

## 2019-03-21 DIAGNOSIS — E1159 Type 2 diabetes mellitus with other circulatory complications: Secondary | ICD-10-CM

## 2019-03-21 DIAGNOSIS — E1169 Type 2 diabetes mellitus with other specified complication: Secondary | ICD-10-CM | POA: Diagnosis not present

## 2019-03-21 DIAGNOSIS — R809 Proteinuria, unspecified: Secondary | ICD-10-CM

## 2019-03-21 DIAGNOSIS — E1129 Type 2 diabetes mellitus with other diabetic kidney complication: Secondary | ICD-10-CM

## 2019-03-21 DIAGNOSIS — Z794 Long term (current) use of insulin: Secondary | ICD-10-CM

## 2019-03-21 LAB — BAYER DCA HB A1C WAIVED: HB A1C (BAYER DCA - WAIVED): 7.6 % — ABNORMAL HIGH (ref <7.0)

## 2019-03-21 MED ORDER — DONEPEZIL HCL 5 MG PO TABS: 5.0000 mg | ORAL_TABLET | Freq: Every day | ORAL | 3 refills | Status: DC

## 2019-03-21 NOTE — Progress Notes (Addendum)
BP (!) 145/70   Pulse 71   Ht '5\' 6"'  (1.676 m)   Wt 235 lb (106.6 kg)   SpO2 97%   BMI 37.93 kg/m    Subjective:   Patient ID: [name], male    DOB: [birth year], 79 y.o.   MRN: [id number]  HPI: [name] is a 79 y.o. male presenting on 03/21/2019 for Medical Management of Chronic Issues, Diabetes, and Dementia (becoming more forgetful. Would like med to help)   HPI Patient feels like his been getting more forgetful and feels like he is having starting of dementia.  He says he forgets little things and has to write lists to keep track of things. MMSE of 27    Type 2 diabetes mellitus Patient comes in today for recheck of his diabetes. Patient has been currently taking Lantus 52 and Humalog 20-25 three times daily with meals.  And Metformin. Patient is currently on an ACE inhibitor/ARB. Patient has not seen an ophthalmologist this year. Patient denies any issues with their feet.  Patient is injecting insulin 4 times daily.  He is testing his blood sugar 4-5 times daily and would greatly benefit from CGM (Dexcom)  Hypertension Patient is currently on losartan and amlodipine, and their blood pressure today is 145/70. Patient denies any lightheadedness or dizziness. Patient denies headaches, blurred vision, chest pains, shortness of breath, or weakness. Denies any side effects from medication and is content with current medication.   Hyperlipidemia Patient is coming in for recheck of his hyperlipidemia. The patient is currently taking atorvastatin. They deny any issues with myalgias or history of liver damage from it. They deny any focal numbness or weakness or chest pain.   Relevant past medical, surgical, family and social history reviewed and updated as indicated. Interim medical history since our last visit reviewed. Allergies and medications reviewed and updated.  Review of Systems  Constitutional: Negative for chills and fever.  Eyes: Negative for visual  disturbance.  Respiratory: Negative for shortness of breath and wheezing.   Cardiovascular: Negative for chest pain and leg swelling.  Musculoskeletal: Negative for back pain and gait problem.  Skin: Negative for rash.  Neurological: Negative for dizziness, weakness and light-headedness.  All other systems reviewed and are negative.   Per HPI unless specifically indicated above   Allergies as of 03/21/2019      Reactions   Ace Inhibitors Hives, Swelling, Rash   Rash,hives,tongue swelling   Angiotensin Receptor Blockers    Unknown reaction    Ciprofloxacin Other (See Comments)   confusion   Oxycodone-acetaminophen Other (See Comments)   Unknown reaction   Robaxin [methocarbamol] Other (See Comments)   Confusion    Toradol [ketorolac Tromethamine] Other (See Comments)   confusion   Valium Other (See Comments)   Hallucinations; confusion   Doxycycline Hives, Rash      Medication List       Accurate as of March 21, 2019  9:39 AM. If you have any questions, ask your nurse or doctor.        STOP taking these medications   lidocaine 5 % Commonly known as: Lidoderm Stopped by: Fransisca Kaufmann Devany Aja, MD   solifenacin 10 MG tablet Commonly known as: VESICARE Stopped by: Worthy Rancher, MD     TAKE these medications   acetaminophen 650 MG CR tablet Commonly known as: TYLENOL Take 1,300 mg by mouth every 8 (eight) hours as needed for pain.   ACETAMINOPHEN-BUTALBITAL 50-325 MG Tabs Take by mouth  2 (two) times daily as needed.   amLODipine 5 MG tablet Commonly known as: NORVASC TAKE 1 TABLET BY MOUTH  DAILY   aspirin EC 81 MG tablet Take 81 mg by mouth daily.   atorvastatin 20 MG tablet Commonly known as: LIPITOR Take 1 tablet (20 mg total) by mouth every evening.   cetirizine 10 MG tablet Commonly known as: ZYRTEC Take 10 mg by mouth every evening.   cyclobenzaprine 10 MG tablet Commonly known as: FLEXERIL Take 1 tablet (10 mg total) by mouth 2 (two) times  daily as needed for muscle spasms.   docusate sodium 100 MG capsule Commonly known as: COLACE Take 100 mg by mouth 2 (two) times daily.   famotidine 20 MG tablet Commonly known as: PEPCID Take 1 tablet (20 mg total) by mouth 2 (two) times daily with a meal.   glucose blood test strip Test 4 times daily Dx E11.59   HumaLOG KwikPen 100 UNIT/ML KwikPen Generic drug: insulin lispro INJECT 0 TO 15 UNITS  SUBCUTANEOUSLY 3 TIMES  DAILY BEFORE MEALS  (70-150=8 UNITS, 151-200=9  UNITS)   Lantus SoloStar 100 UNIT/ML Solostar Pen Generic drug: insulin glargine INJECT SUBCUTANEOUSLY 52  UNITS AT BEDTIME What changed: See the new instructions.   losartan 50 MG tablet Commonly known as: COZAAR TAKE 1 TABLET BY MOUTH  DAILY   metFORMIN 500 MG 24 hr tablet Commonly known as: GLUCOPHAGE-XR TAKE 2 TABLETS BY MOUTH  TWICE DAILY   nitroGLYCERIN 0.4 MG SL tablet Commonly known as: NITROSTAT DISSOLVE ONE TABLET UNDER THE TONGUE EVERY 5 MINUTES AS NEEDED FOR CHEST PAIN.  DO NOT EXCEED A TOTAL OF 3 DOSES IN 15 MINUTES NOW   PARoxetine 20 MG tablet Commonly known as: PAXIL TAKE 1 TABLET BY MOUTH  DAILY   polyethylene glycol powder 17 GM/SCOOP powder Commonly known as: GLYCOLAX/MIRALAX Take 17 g by mouth as needed.   tamsulosin 0.4 MG Caps capsule Commonly known as: FLOMAX TAKE 1 CAPSULE BY MOUTH  DAILY   vitamin B-12 1000 MCG tablet Commonly known as: CYANOCOBALAMIN Take 2,000 mcg by mouth daily.   Vitamin D3 50 MCG (2000 UT) capsule Take 2,000 Units by mouth daily.        Objective:   BP (!) 145/70   Pulse 71   Ht '5\' 6"'  (1.676 m)   Wt 235 lb (106.6 kg)   SpO2 97%   BMI 37.93 kg/m   Wt Readings from Last 3 Encounters:  03/21/19 235 lb (106.6 kg)  02/05/19 220 lb (99.8 kg)  11/29/18 231 lb 6.4 oz (105 kg)    Physical Exam Vitals and nursing note reviewed.  Constitutional:      General: He is not in acute distress.    Appearance: He is well-developed. He is not  diaphoretic.  Eyes:     General: No scleral icterus.    Conjunctiva/sclera: Conjunctivae normal.  Neck:     Thyroid: No thyromegaly.  Cardiovascular:     Rate and Rhythm: Normal rate and regular rhythm.     Heart sounds: Normal heart sounds. No murmur.  Pulmonary:     Effort: Pulmonary effort is normal. No respiratory distress.     Breath sounds: Normal breath sounds. No wheezing.  Musculoskeletal:        General: Normal range of motion.     Cervical back: Neck supple.  Lymphadenopathy:     Cervical: No cervical adenopathy.  Skin:    General: Skin is warm and dry.  Findings: No rash.  Neurological:     Mental Status: He is alert and oriented to person, place, and time.     Coordination: Coordination normal.  Psychiatric:        Behavior: Behavior normal.       Assessment & Plan:   Problem List Items Addressed This Visit      Cardiovascular and Mediastinum   Hypertension associated with diabetes ([place])   Relevant Orders   CBC with Differential/Platelet   CMP14+EGFR     Endocrine   Hyperlipidemia associated with type 2 diabetes mellitus ([place])   Relevant Orders   CBC with Differential/Platelet   Lipid panel   Type 2 diabetes mellitus with circulatory disorder (HCC) - Primary   Relevant Orders   Bayer DCA Hb A1c Waived   CBC with Differential/Platelet   Microalbuminuria due to type 2 diabetes mellitus ([place])     Other   Obesity (BMI 30-39.9)   Relevant Orders   Lipid panel    Other Visit Diagnoses    Memory impairment       Relevant Medications   donepezil (ARICEPT) 5 MG tablet      A1c 7.6, will have him increase his long-acting insulin from 52-54 and keep a track on it hopefully that will be enough to adjust it down a little bit.  We will start a low-dose Aricept for mild memory impairment, not qualifies as dementia. Follow up plan: Return in about 3 months (around 06/21/2019), or if symptoms worsen or fail to improve, for Diabetes and hypertension and  cholesterol recheck.  Counseling provided for all of the vaccine components Orders Placed This Encounter  Procedures  . Bayer DCA Hb A1c Waived  . CBC with Differential/Platelet  . CMP14+EGFR  . Lipid panel    Caryl Pina, MD [facility] 03/21/2019, 9:39 AM

## 2019-03-22 LAB — CBC WITH DIFFERENTIAL/PLATELET
Basophils Absolute: 0.1 10*3/uL (ref 0.0–0.2)
Basos: 1 %
EOS (ABSOLUTE): 0.2 10*3/uL (ref 0.0–0.4)
Eos: 4 %
Hematocrit: 40.8 % (ref 37.5–51.0)
Hemoglobin: 13.2 g/dL (ref 13.0–17.7)
Immature Grans (Abs): 0 10*3/uL (ref 0.0–0.1)
Immature Granulocytes: 0 %
Lymphocytes Absolute: 1.8 10*3/uL (ref 0.7–3.1)
Lymphs: 28 %
MCH: 27.2 pg (ref 26.6–33.0)
MCHC: 32.4 g/dL (ref 31.5–35.7)
MCV: 84 fL (ref 79–97)
Monocytes Absolute: 0.5 10*3/uL (ref 0.1–0.9)
Monocytes: 8 %
Neutrophils Absolute: 3.7 10*3/uL (ref 1.4–7.0)
Neutrophils: 59 %
Platelets: 281 10*3/uL (ref 150–450)
RBC: 4.85 x10E6/uL (ref 4.14–5.80)
RDW: 15.8 % — ABNORMAL HIGH (ref 11.6–15.4)
WBC: 6.3 10*3/uL (ref 3.4–10.8)

## 2019-03-22 LAB — LIPID PANEL
Chol/HDL Ratio: 3.8 ratio (ref 0.0–5.0)
Cholesterol, Total: 142 mg/dL (ref 100–199)
HDL: 37 mg/dL — ABNORMAL LOW (ref >39)
LDL Chol Calc (NIH): 72 mg/dL (ref 0–99)
Triglycerides: 199 mg/dL — ABNORMAL HIGH (ref 0–149)
VLDL Cholesterol Cal: 33 mg/dL (ref 5–40)

## 2019-03-22 LAB — CMP14+EGFR
ALT: 15 IU/L (ref 0–44)
AST: 15 IU/L (ref 0–40)
Albumin/Globulin Ratio: 1.2 (ref 1.2–2.2)
Albumin: 3.7 g/dL (ref 3.7–4.7)
Alkaline Phosphatase: 73 IU/L (ref 39–117)
BUN/Creatinine Ratio: 14 (ref 10–24)
BUN: 12 mg/dL (ref 8–27)
Bilirubin Total: 0.4 mg/dL (ref 0.0–1.2)
CO2: 21 mmol/L (ref 20–29)
Calcium: 9 mg/dL (ref 8.6–10.2)
Chloride: 101 mmol/L (ref 96–106)
Creatinine, Ser: 0.86 mg/dL (ref 0.76–1.27)
GFR calc Af Amer: 95 mL/min/{1.73_m2} (ref >59)
GFR calc non Af Amer: 82 mL/min/{1.73_m2} (ref >59)
Globulin, Total: 3.1 g/dL (ref 1.5–4.5)
Glucose: 227 mg/dL — ABNORMAL HIGH (ref 65–99)
Potassium: 4 mmol/L (ref 3.5–5.2)
Sodium: 138 mmol/L (ref 134–144)
Total Protein: 6.8 g/dL (ref 6.0–8.5)

## 2019-03-24 ENCOUNTER — Ambulatory Visit: Payer: Medicare Other

## 2019-03-26 DIAGNOSIS — Z794 Long term (current) use of insulin: Secondary | ICD-10-CM | POA: Diagnosis not present

## 2019-03-26 DIAGNOSIS — H02834 Dermatochalasis of left upper eyelid: Secondary | ICD-10-CM | POA: Diagnosis not present

## 2019-03-26 DIAGNOSIS — E119 Type 2 diabetes mellitus without complications: Secondary | ICD-10-CM | POA: Diagnosis not present

## 2019-03-26 DIAGNOSIS — H02831 Dermatochalasis of right upper eyelid: Secondary | ICD-10-CM | POA: Diagnosis not present

## 2019-03-26 DIAGNOSIS — Z961 Presence of intraocular lens: Secondary | ICD-10-CM | POA: Diagnosis not present

## 2019-03-26 LAB — HM DIABETES EYE EXAM

## 2019-03-27 DIAGNOSIS — M5416 Radiculopathy, lumbar region: Secondary | ICD-10-CM | POA: Diagnosis not present

## 2019-04-10 ENCOUNTER — Telehealth: Payer: Self-pay | Admitting: Family Medicine

## 2019-04-10 DIAGNOSIS — E785 Hyperlipidemia, unspecified: Secondary | ICD-10-CM | POA: Diagnosis not present

## 2019-04-10 DIAGNOSIS — M545 Low back pain: Secondary | ICD-10-CM | POA: Diagnosis not present

## 2019-04-10 DIAGNOSIS — M48061 Spinal stenosis, lumbar region without neurogenic claudication: Secondary | ICD-10-CM | POA: Diagnosis not present

## 2019-04-10 DIAGNOSIS — E119 Type 2 diabetes mellitus without complications: Secondary | ICD-10-CM | POA: Diagnosis not present

## 2019-04-10 NOTE — Telephone Encounter (Signed)
Patient 's wife aware and verbalized understanding. °

## 2019-04-10 NOTE — Telephone Encounter (Signed)
As far as I have ever heard Aricept does not affect blood sugar, I would continue the medicine and keep a close eye on the blood sugars and let me know how things go.  I have never heard of Aricept affecting blood sugar.

## 2019-04-13 ENCOUNTER — Ambulatory Visit: Payer: Medicare Other | Attending: Internal Medicine

## 2019-04-13 DIAGNOSIS — Z23 Encounter for immunization: Secondary | ICD-10-CM

## 2019-04-13 NOTE — Progress Notes (Signed)
   Covid-19 Vaccination Clinic  Name:  [name]    MRN: [id number] DOB: [birth year]  04/13/2019  [name] was observed post Covid-19 immunization for 15 minutes without incident. He was provided with Vaccine Information Sheet and instruction to access the V-Safe system.   [name] was instructed to call 911 with any severe reactions post vaccine: Marland Kitchen Difficulty breathing  . Swelling of face and throat  . A fast heartbeat  . A bad rash all over body  . Dizziness and weakness   Immunizations Administered    Name Date Dose VIS Date Route   Pfizer COVID-19 Vaccine 04/13/2019  4:03 PM 0.3 mL 12/22/2018 Intramuscular   Manufacturer: [place]   Lot: [id number]   [place]: [id number]

## 2019-04-22 ENCOUNTER — Other Ambulatory Visit: Payer: Self-pay | Admitting: Family Medicine

## 2019-04-30 DIAGNOSIS — G4733 Obstructive sleep apnea (adult) (pediatric): Secondary | ICD-10-CM | POA: Diagnosis not present

## 2019-05-02 DIAGNOSIS — M5136 Other intervertebral disc degeneration, lumbar region: Secondary | ICD-10-CM | POA: Diagnosis not present

## 2019-05-02 DIAGNOSIS — M961 Postlaminectomy syndrome, not elsewhere classified: Secondary | ICD-10-CM | POA: Insufficient documentation

## 2019-05-07 ENCOUNTER — Ambulatory Visit: Payer: Medicare Other | Attending: Internal Medicine

## 2019-05-07 DIAGNOSIS — Z23 Encounter for immunization: Secondary | ICD-10-CM

## 2019-05-07 NOTE — Progress Notes (Signed)
   Covid-19 Vaccination Clinic  Name:  [name]    MRN: [id number] DOB: [birth year]  05/07/2019  [name] was observed post Covid-19 immunization for 15 minutes without incident. He was provided with Vaccine Information Sheet and instruction to access the V-Safe system.   [name] was instructed to call 911 with any severe reactions post vaccine: Marland Kitchen Difficulty breathing  . Swelling of face and throat  . A fast heartbeat  . A bad rash all over body  . Dizziness and weakness   Immunizations Administered    Name Date Dose VIS Date Route   Pfizer COVID-19 Vaccine 05/07/2019  3:43 PM 0.3 mL 03/07/2018 Intramuscular   Manufacturer: [place]   Lot: [id number]   [place]: [id number]

## 2019-05-09 ENCOUNTER — Telehealth: Payer: Self-pay | Admitting: Cardiology

## 2019-05-09 ENCOUNTER — Encounter: Payer: Self-pay | Admitting: Cardiology

## 2019-05-09 ENCOUNTER — Ambulatory Visit (INDEPENDENT_AMBULATORY_CARE_PROVIDER_SITE_OTHER): Payer: Medicare Other | Admitting: Cardiology

## 2019-05-09 ENCOUNTER — Other Ambulatory Visit: Payer: Self-pay

## 2019-05-09 VITALS — BP 158/80 | HR 72 | Ht 66.0 in | Wt 237.2 lb

## 2019-05-09 DIAGNOSIS — I10 Essential (primary) hypertension: Secondary | ICD-10-CM | POA: Diagnosis not present

## 2019-05-09 DIAGNOSIS — I350 Nonrheumatic aortic (valve) stenosis: Secondary | ICD-10-CM

## 2019-05-09 DIAGNOSIS — E782 Mixed hyperlipidemia: Secondary | ICD-10-CM

## 2019-05-09 DIAGNOSIS — I25119 Atherosclerotic heart disease of native coronary artery with unspecified angina pectoris: Secondary | ICD-10-CM

## 2019-05-09 MED ORDER — ISOSORBIDE MONONITRATE ER 30 MG PO TB24: 30.0000 mg | ORAL_TABLET | Freq: Every evening | ORAL | 1 refills | Status: DC

## 2019-05-09 NOTE — Telephone Encounter (Signed)
  Precert needed for:  2 D Echo dx: aortic stenosis  Location: [facility]   Date: Oct 31, 2019

## 2019-05-09 NOTE — Progress Notes (Signed)
Cardiology Office Note  Date: 05/09/2019   ID: [name], DOB 09/29/1940, MRN [id number]  PCP:  Dettinger, Fransisca Kaufmann, MD  Cardiologist:  Rozann Lesches, MD Electrophysiologist:  None   Chief Complaint  Patient presents with  . Cardiac follow-up    History of Present Illness: [name] is a 79 y.o. male last seen in October 2020.  He presents for a routine visit.  He does not report frank angina symptoms but does have dyspnea on exertion and lack of stamina.  This has been generally worse over the last year.  He has used nitroglycerin on 1 occasion.  He states that he did have COVID-19 back in January, relatively mild symptoms, was not hospitalized.  I reviewed his medications which are outlined below.  He reports compliance, no obvious intolerances.  His last LDL was 72.  I did talk with him about adding Imdur to his regimen.  We discussed obtaining an follow-up echocardiogram around time of his next visit in 6 months.  I personally reviewed his ECG today which shows normal sinus rhythm with nonspecific T wave changes.   Past Medical History:  Diagnosis Date  . Anxiety   . Aortic atherosclerosis ([place]) 12/19/2017  . Arthritis   . Bursitis of hip   . Cervical spondylosis   . Chronic headaches   . Coronary atherosclerosis of native coronary artery    BMS nondominant RCA 12/2004  . Essential hypertension   . GERD (gastroesophageal reflux disease)   . History of adenomatous polyp of colon   . History of kidney stones   . History of MI (myocardial infarction) 12/2004  . History of viral meningitis 02/28/2018   December 2019  . Hyperlipidemia   . Internal hemorrhoids   . Low back pain   . Nocturia more than twice per night   . OSA (obstructive sleep apnea)   . Spinal stenosis   . Type 2 diabetes mellitus treated with insulin ([place])   . Wears dentures    Upper    Past Surgical History:  Procedure Laterality Date  . CARDIAC CATHETERIZATION  06-14-2004   dr Lia Foyer   total occlusion mD1, RI 30-40%, mRCA nondominant hazy 80% and scattered 30-40%,  ef 71% (medical mangement)  . CARDIOVASCULAR STRESS TEST  09-17-2015   dr Domenic Polite   Low risk nuclear study w/ reversible mild small anteroapical defect,  normal LV function and wall motion, nuclear stress ef 61%  . CATARACT EXTRACTION W/ INTRAOCULAR LENS IMPLANT Right 07/2014  . CHOLECYSTECTOMY    . COLONOSCOPY  09/10/2008   normal  . CORONARY ANGIOPLASTY WITH STENT PLACEMENT  12-17-2004   dr benismhon/ dr Olevia Perches   nonobstructive cad LAD and LCFx,  BMS x1 to total occlusion RCA   . CYSTOSCOPY W/ URETEROSCOPY W/ LITHOTRIPSY  08/2010  . ERCP  03/14/2008  . LAMINECTOMY    . LAPAROSCOPIC CHOLECYSTECTOMY  05/2012  . LEFT HEART CATHETERIZATION WITH CORONARY ANGIOGRAM N/A 05/14/2011   Procedure: LEFT HEART CATHETERIZATION WITH CORONARY ANGIOGRAM;  Surgeon: Sherren Mocha, MD;  Location: [facility] CATH LAB;  Service: Cardiovascular;  Laterality: N/A;    non-obstructive LM, LAD, LCFx, patent RCA stent  . LUMBAR LAMINECTOMY/DECOMPRESSION MICRODISCECTOMY N/A 02/24/2017   Procedure: Microlumbar decompression L4-5, L5-S1;  Surgeon: Susa Day, MD;  Location: [facility] ORS;  Service: Orthopedics;  Laterality: N/A;  120 mins  . ROTATOR CUFF REPAIR Right 03/2002  . ROTATOR CUFF REPAIR Left 12/11/2015  . TOTAL HIP ARTHROPLASTY Right 12/20/2008  . TOTAL  HIP ARTHROPLASTY Left 07/05/2018   Procedure: TOTAL HIP ARTHROPLASTY ANTERIOR APPROACH;  Surgeon: Gaynelle Arabian, MD;  Location: [facility] ORS;  Service: Orthopedics;  Laterality: Left;  167min  . TOTAL KNEE ARTHROPLASTY Bilateral left 12-11-2003/  right 07-31-2004   dr Wynelle Link [facility]  . UPPER GASTROINTESTINAL ENDOSCOPY  01/08/2008   bx, inlet patch, duodenitis  . YAG LASER APPLICATION Right [id number]   Procedure: YAG LASER APPLICATION;  Surgeon: Williams Che, MD;  Location: [facility] ORS;  Service: Ophthalmology;  Laterality: Right;    Current Outpatient Medications  Medication Sig  Dispense Refill  . acetaminophen (TYLENOL) 650 MG CR tablet Take 1,300 mg by mouth every 8 (eight) hours as needed for pain.     . ACETAMINOPHEN-BUTALBITAL 50-325 MG TABS Take by mouth 2 (two) times daily as needed.    Marland Kitchen amLODipine (NORVASC) 5 MG tablet TAKE 1 TABLET BY MOUTH  DAILY 90 tablet 3  . aspirin EC 81 MG tablet Take 81 mg by mouth daily.    Marland Kitchen atorvastatin (LIPITOR) 20 MG tablet Take 1 tablet (20 mg total) by mouth every evening. 90 tablet 3  . cetirizine (ZYRTEC) 10 MG tablet Take 10 mg by mouth every evening.     . Cholecalciferol (VITAMIN D3) 50 MCG (2000 UT) capsule Take 2,000 Units by mouth daily.    . cyclobenzaprine (FLEXERIL) 10 MG tablet Take 1 tablet (10 mg total) by mouth 2 (two) times daily as needed for muscle spasms. 20 tablet 0  . docusate sodium (COLACE) 100 MG capsule Take 100 mg by mouth 2 (two) times daily.    Marland Kitchen donepezil (ARICEPT) 5 MG tablet Take 1 tablet (5 mg total) by mouth at bedtime. 90 tablet 3  . famotidine (PEPCID) 20 MG tablet Take 1 tablet (20 mg total) by mouth 2 (two) times daily with a meal. 180 tablet 3  . glucose blood test strip Test 4 times daily Dx E11.59 400 each 3  . HUMALOG KWIKPEN 100 UNIT/ML KwikPen INJECT 0 TO 15 UNITS  SUBCUTANEOUSLY 3 TIMES  DAILY BEFORE MEALS  (70-150=8 UNITS, 151-200=9  UNITS) 45 mL 0  . LANTUS SOLOSTAR 100 UNIT/ML Solostar Pen INJECT SUBCUTANEOUSLY 52  UNITS AT BEDTIME 60 mL 3  . losartan (COZAAR) 50 MG tablet TAKE 1 TABLET BY MOUTH  DAILY 90 tablet 0  . metFORMIN (GLUCOPHAGE-XR) 500 MG 24 hr tablet TAKE 2 TABLETS BY MOUTH  TWICE DAILY 360 tablet 0  . nitroGLYCERIN (NITROSTAT) 0.4 MG SL tablet DISSOLVE ONE TABLET UNDER THE TONGUE EVERY 5 MINUTES AS NEEDED FOR CHEST PAIN.  DO NOT EXCEED A TOTAL OF 3 DOSES IN 15 MINUTES NOW 25 tablet 0  . PARoxetine (PAXIL) 20 MG tablet TAKE 1 TABLET BY MOUTH  DAILY 90 tablet 3  . polyethylene glycol powder (GLYCOLAX/MIRALAX) 17 GM/SCOOP powder Take 17 g by mouth as needed.    . tamsulosin  (FLOMAX) 0.4 MG CAPS capsule TAKE 1 CAPSULE BY MOUTH  DAILY 90 capsule 3  . vitamin B-12 (CYANOCOBALAMIN) 1000 MCG tablet Take 2,000 mcg by mouth daily.    . isosorbide mononitrate (IMDUR) 30 MG 24 hr tablet Take 1 tablet (30 mg total) by mouth every evening. 90 tablet 1   No current facility-administered medications for this visit.   Allergies:  Ace inhibitors, Angiotensin receptor blockers, Ciprofloxacin, Oxycodone-acetaminophen, Robaxin [methocarbamol], Toradol [ketorolac tromethamine], Valium, and Doxycycline   ROS:  No palpitations or syncope.  Reports chronic lower back pain.  Physical Exam: VS:  BP (!) 158/80  Pulse 72   Ht 5\' 6"  (1.676 m)   Wt 237 lb 3.2 oz (107.6 kg)   SpO2 97%   BMI 38.29 kg/m , BMI Body mass index is 38.29 kg/m.  Wt Readings from Last 3 Encounters:  05/09/19 237 lb 3.2 oz (107.6 kg)  03/21/19 235 lb (106.6 kg)  02/05/19 220 lb (99.8 kg)    General: Elderly male, appears comfortable at rest. HEENT: Conjunctiva and lids normal, wearing a mask. Neck: Supple, no elevated JVP or carotid bruits, no thyromegaly. Lungs: Clear to auscultation, nonlabored breathing at rest. Cardiac: Regular rate and rhythm, no S3, 3/6 systolic murmur, no pericardial rub. Abdomen: Protuberant, nontender, bowel sounds present. Extremities: No pitting edema, distal pulses 2+.  ECG:  An ECG dated 12/18/2017 was personally reviewed today and demonstrated:  Normal sinus rhythm.  Recent Labwork: 03/21/2019: ALT 15; AST 15; BUN 12; Creatinine, Ser 0.86; Hemoglobin 13.2; Platelets 281; Potassium 4.0; Sodium 138     Component Value Date/Time   CHOL 142 03/21/2019 1011   CHOL 139 06/02/2012 1007   TRIG 199 (H) 03/21/2019 1011   TRIG 185 (H) 04/10/2013 0937   TRIG 107 06/02/2012 1007   HDL 37 (L) 03/21/2019 1011   HDL 34 (L) 04/10/2013 0937   HDL 40 06/02/2012 1007   CHOLHDL 3.8 03/21/2019 1011   CHOLHDL 3.8 12/15/2017 0552   VLDL 30 12/15/2017 0552   LDLCALC 72 03/21/2019 1011    LDLCALC 62 04/10/2013 0937   LDLCALC 78 06/02/2012 1007    Other Studies Reviewed Today:  Lexiscan Myoview 09/17/2015:  No diagnostic ST segment changes to indicate ischemia.  Small, mild intensity, reversible anteroapical defect consistent with a small region of ischemia.  This is a low risk study.  Nuclear stress EF: 61%.  Echocardiogram 06/21/2018: 1. The left ventricle has hyperdynamic systolic function, with an ejection fraction of >65%. The cavity size was normal. There is moderately increased left ventricular wall thickness. Left ventricular diastolic Doppler parameters are consistent with  impaired relaxation. 2. The right ventricle has normal systolic function. The cavity was normal. There is no increase in right ventricular wall thickness. 3. The aortic valve is tricuspid. Severely thickening of the aortic valve. Severe calcifcation of the aortic valve. Mild-moderate stenosis of the aortic valve. Severe aortic annular calcification noted. 4. The mitral valve is abnormal. Mild thickening of the mitral valve leaflet. Mild calcification of the mitral valve leaflet. There is mild mitral annular calcification present. No evidence of mitral valve stenosis. 5. There is mild dilatation of the aortic root measuring 41 mm. 6. Pulmonary hypertension is indeterminant, inadequate TR jet.  Assessment and Plan:  1.  CAD status post BMS to nondominant RCA in 2006.  We have continued medical therapy, Myoview in 2017 showed small anteroapical ischemic territory.  He does report dyspnea exertion and decreased stamina, has also gained weight and does have aortic stenosis that was in the mild to moderate range by echocardiogram last year.  Continue aspirin, Norvasc, Lipitor, and losartan.  We will add Imdur 30 mg daily.  2.  Calcific aortic stenosis, mild to moderate by echocardiogram in June 2020.  We will obtain a follow-up echocardiogram in 6 months with clinical reevaluation.  3.  Mixed  hyperlipidemia, continues on Lipitor with recent LDL 72.  4.  Essential hypertension, blood pressure is elevated.  We discussed weight loss, also adding Imdur as discussed above.  Medication Adjustments/Labs and Tests Ordered: Current medicines are reviewed at length with the patient today.  Concerns regarding medicines are outlined above.   Tests Ordered: Orders Placed This Encounter  Procedures  . EKG 12-Lead  . ECHOCARDIOGRAM COMPLETE    Medication Changes: Meds ordered this encounter  Medications  . isosorbide mononitrate (IMDUR) 30 MG 24 hr tablet    Sig: Take 1 tablet (30 mg total) by mouth every evening.    Dispense:  90 tablet    Refill:  1    05/09/2019 NEW    Disposition:  Follow up 6 months in the [place] office.  Signed, Satira Sark, MD, [facility] 05/09/2019 8:41 AM    [facility] [address], [place], [place] [address] Phone: [phone]; Fax: [phone]

## 2019-05-09 NOTE — Patient Instructions (Addendum)
Medication Instructions:    Your physician has recommended you make the following change in your medication:   Start isosorbide mononitrate (imdur) 30 mg by mouth daily   Continue other medications the same  Labwork:  NONE  Testing/Procedures: Your physician has requested that you have an echocardiogram in 6 months just before your next visit. Echocardiography is a painless test that uses sound waves to create images of your heart. It provides your doctor with information about the size and shape of your heart and how well your heart's chambers and valves are working. This procedure takes approximately one hour. There are no restrictions for this procedure.  Follow-Up:  Your physician recommends that you schedule a follow-up appointment in: 6 months (office).  Any Other Special Instructions Will Be Listed Below (If Applicable).  If you need a refill on your cardiac medications before your next appointment, please call your pharmacy.

## 2019-05-14 ENCOUNTER — Other Ambulatory Visit: Payer: Self-pay | Admitting: Family Medicine

## 2019-05-22 DIAGNOSIS — M5136 Other intervertebral disc degeneration, lumbar region: Secondary | ICD-10-CM | POA: Diagnosis not present

## 2019-05-28 IMAGING — RF DG FLUORO GUIDE LUMBAR PUNCTURE
1 series · 1 of 1 positions shown · non-contrast
Comparison: none

CLINICAL DATA: Acute encephalopathy

[Series 1: cp_standard · 0.18mm/px · 1 of 1 slices shown]
[im 1/1]
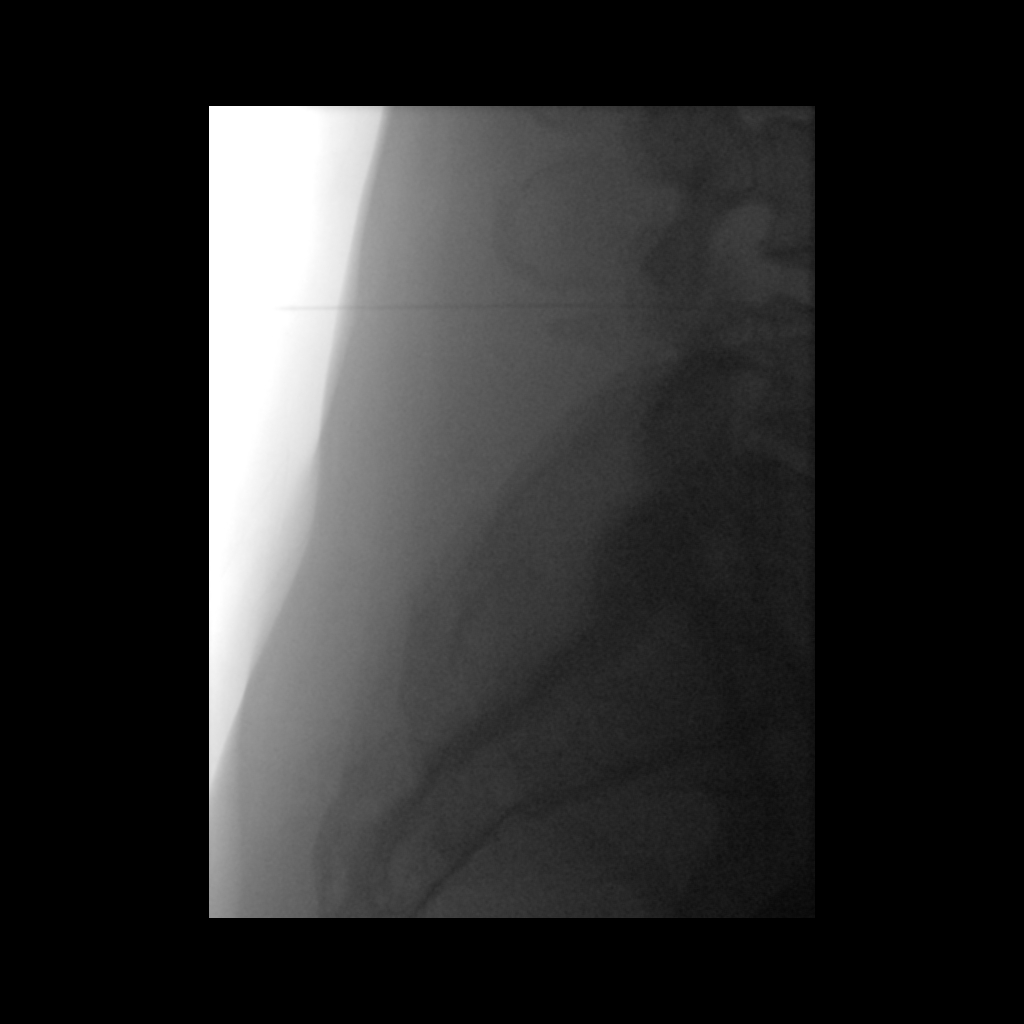

[1 of 1 positions shown; findings below may reference images not displayed]

EXAM:
DIAGNOSTIC LUMBAR PUNCTURE UNDER FLUOROSCOPIC GUIDANCE

FLUOROSCOPY TIME:  Fluoroscopy Time: 0 minutes 18 seconds

Radiation Exposure Index (if provided by the fluoroscopic device):
11.5 mGy

Number of Acquired Spot Images: 1

PROCEDURE:
Procedure, benefits, and risks were discussed with the patient,
including alternatives.

Patient's questions were answered.

Written informed consent was obtained.

Timeout protocol followed.

Patient placed in LEFT lateral decubitus position, unable to lie
prone.

L4-L5 disc space was localized under fluoroscopy.

Skin prepped and draped in usual sterile fashion.

Skin and soft tissues anesthetized with 3 mL of 1% lidocaine.

22 gauge needle was advanced into the spinal canal where clear
colorless CSF was encountered with an opening pressure of 30 cm H2O
(measured LLD).

14 mL of CSF was obtained in 4 tubes for requested analysis.

Procedure tolerated very well by patient without immediate
complication.
IMPRESSION: Fluoroscopic guided lumbar puncture as above.

Elevated opening pressure of 30 cm H2O.

## 2019-06-07 ENCOUNTER — Ambulatory Visit (INDEPENDENT_AMBULATORY_CARE_PROVIDER_SITE_OTHER): Payer: Medicare Other | Admitting: *Deleted

## 2019-06-07 VITALS — Ht 66.0 in | Wt 235.0 lb

## 2019-06-07 DIAGNOSIS — Z0000 Encounter for general adult medical examination without abnormal findings: Secondary | ICD-10-CM | POA: Diagnosis not present

## 2019-06-07 NOTE — Progress Notes (Signed)
MEDICARE ANNUAL WELLNESS VISIT  06/07/2019  Telephone Visit Disclaimer This Medicare AWV was conducted by telephone due to national recommendations for restrictions regarding the COVID-19 Pandemic (e.g. social distancing).  I verified, using two identifiers, that I am speaking with [name] or their authorized healthcare agent. I discussed the limitations, risks, security, and privacy concerns of performing an evaluation and management service by telephone and the potential availability of an in-person appointment in the future. The patient expressed understanding and agreed to proceed.   Subjective:  [name] is a 79 y.o. male patient of [name], [name] Kaufmann, [name] who had a Medicare Annual Wellness Visit today via telephone. [name] is Retired and lives with their spouse. he has 1 child. he reports that he is socially active and does interact with friends/family regularly. he is minimally physically active and enjoys hunting.  Patient Care Team: [name], [name] Kaufmann, [name] as PCP - General (Family Medicine) Satira Sark, [name] as PCP - Cardiology (Cardiology) Satira Sark, [name] as Consulting Physician (Cardiology) Sydnee Cabal, [name] as Consulting Physician (Orthopedic Surgery) Gaynelle Arabian, [name] as Consulting Physician (Orthopedic Surgery) Suella Broad, [name] as Consulting Physician (Physical Medicine and Rehabilitation)  Advanced Directives 06/07/2019 02/05/2019 07/05/2018 06/30/2018 06/02/2018 12/19/2017 12/18/2017  Does Patient Have a Medical Advance Directive? Yes No No No Yes No Yes  Type of Paramedic of [place];Living will - - - [place];Living will - Living will  Does patient want to make changes to medical advance directive? No - Patient declined - - - - No - Patient declined -  Copy of [place] in Chart? No - copy requested - - - No - copy requested - -  Would patient like information on creating a  medical advance directive? - - No - Patient declined No - Patient declined - No - Patient declined -  Pre-existing out of facility DNR order (yellow form or pink MOST form) - - - - - - -    Hospital Utilization Over the Past 12 Months: # of hospitalizations or ER visits: 0 # of surgeries: 0  Review of Systems    Patient reports that his overall health is worse compared to last year.  General ROS: negative  Patient Reported Readings (BP, Pulse, CBG, Weight, etc) Ht 5\' 6"  (1.676 m)   Wt 235 lb (106.6 kg)   BMI 37.93 kg/m    Pain Assessment       Current Medications & Allergies (verified) Allergies as of 06/07/2019      Reactions   Ace Inhibitors Hives, Swelling, Rash   Rash,hives,tongue swelling   Angiotensin Receptor Blockers    Unknown reaction    Ciprofloxacin Other (See Comments)   confusion   Oxycodone-acetaminophen Other (See Comments)   Unknown reaction   Robaxin [methocarbamol] Other (See Comments)   Confusion    Toradol [ketorolac Tromethamine] Other (See Comments)   confusion   Valium Other (See Comments)   Hallucinations; confusion   Doxycycline Hives, Rash      Medication List       Accurate as of Jun 07, 2019  9:15 AM. If you have any questions, ask your nurse or doctor.        acetaminophen 650 MG CR tablet Commonly known as: TYLENOL Take 1,300 mg by mouth every 8 (eight) hours as needed for pain.   ACETAMINOPHEN-BUTALBITAL 50-325 MG Tabs Take by mouth 2 (two) times daily as needed.   amLODipine  5 MG tablet Commonly known as: NORVASC TAKE 1 TABLET BY MOUTH  DAILY   aspirin EC 81 MG tablet Take 81 mg by mouth daily.   atorvastatin 20 MG tablet Commonly known as: LIPITOR Take 1 tablet (20 mg total) by mouth every evening.   cetirizine 10 MG tablet Commonly known as: ZYRTEC Take 10 mg by mouth every evening.   cyclobenzaprine 10 MG tablet Commonly known as: FLEXERIL Take 1 tablet (10 mg total) by mouth 2 (two) times daily as needed  for muscle spasms.   docusate sodium 100 MG capsule Commonly known as: COLACE Take 100 mg by mouth 2 (two) times daily.   donepezil 5 MG tablet Commonly known as: Aricept Take 1 tablet (5 mg total) by mouth at bedtime.   famotidine 20 MG tablet Commonly known as: PEPCID Take 1 tablet (20 mg total) by mouth 2 (two) times daily with a meal.   glucose blood test strip Test 4 times daily Dx E11.59   HumaLOG KwikPen 100 UNIT/ML KwikPen Generic drug: insulin lispro INJECT 0 TO 15 UNITS  SUBCUTANEOUSLY 3 TIMES  DAILY BEFORE MEALS ;  70-150=8 UNITS, 151-200= 9  UNITS.   isosorbide mononitrate 30 MG 24 hr tablet Commonly known as: IMDUR Take 1 tablet (30 mg total) by mouth every evening.   Lantus SoloStar 100 UNIT/ML Solostar Pen Generic drug: insulin glargine INJECT SUBCUTANEOUSLY 52  UNITS AT BEDTIME   losartan 50 MG tablet Commonly known as: COZAAR TAKE 1 TABLET BY MOUTH  DAILY   metFORMIN 500 MG 24 hr tablet Commonly known as: GLUCOPHAGE-XR TAKE 2 TABLETS BY MOUTH  TWICE DAILY   nitroGLYCERIN 0.4 MG SL tablet Commonly known as: NITROSTAT DISSOLVE ONE TABLET UNDER THE TONGUE EVERY 5 MINUTES AS NEEDED FOR CHEST PAIN.  DO NOT EXCEED A TOTAL OF 3 DOSES IN 15 MINUTES NOW   PARoxetine 20 MG tablet Commonly known as: PAXIL TAKE 1 TABLET BY MOUTH  DAILY   polyethylene glycol powder 17 GM/SCOOP powder Commonly known as: GLYCOLAX/MIRALAX Take 17 g by mouth as needed.   tamsulosin 0.4 MG Caps capsule Commonly known as: FLOMAX TAKE 1 CAPSULE BY MOUTH  DAILY   vitamin B-12 1000 MCG tablet Commonly known as: CYANOCOBALAMIN Take 2,000 mcg by mouth daily.   Vitamin D3 50 MCG (2000 UT) capsule Take 2,000 Units by mouth daily.       History (reviewed): Past Medical History:  Diagnosis Date  . Anxiety   . Aortic atherosclerosis ([place]) 12/19/2017  . Arthritis   . Bursitis of hip   . Cervical spondylosis   . Chronic headaches   . Coronary atherosclerosis of native coronary  artery    BMS nondominant RCA 12/2004  . Essential hypertension   . GERD (gastroesophageal reflux disease)   . History of adenomatous polyp of colon   . History of kidney stones   . History of MI (myocardial infarction) 12/2004  . History of viral meningitis 02/28/2018   December 2019  . Hyperlipidemia   . Internal hemorrhoids   . Low back pain   . Nocturia more than twice per night   . OSA (obstructive sleep apnea)   . Spinal stenosis   . Type 2 diabetes mellitus treated with insulin ([place])   . Wears dentures    Upper   Past Surgical History:  Procedure Laterality Date  . CARDIAC CATHETERIZATION  06-14-2004  dr Lia Foyer   total occlusion mD1, RI 30-40%, mRCA nondominant hazy 80% and scattered 30-40%,  ef  71% (medical mangement)  . CARDIOVASCULAR STRESS TEST  09-17-2015   dr Domenic Polite   Low risk nuclear study w/ reversible mild small anteroapical defect,  normal LV function and wall motion, nuclear stress ef 61%  . CATARACT EXTRACTION W/ INTRAOCULAR LENS IMPLANT Right 07/2014  . CHOLECYSTECTOMY    . COLONOSCOPY  09/10/2008   normal  . CORONARY ANGIOPLASTY WITH STENT PLACEMENT  12-17-2004   dr benismhon/ dr Olevia Perches   nonobstructive cad LAD and LCFx,  BMS x1 to total occlusion RCA   . CYSTOSCOPY W/ URETEROSCOPY W/ LITHOTRIPSY  08/2010  . ERCP  03/14/2008  . EYE SURGERY Bilateral    cataracts  . LAMINECTOMY    . LAPAROSCOPIC CHOLECYSTECTOMY  05/2012  . LEFT HEART CATHETERIZATION WITH CORONARY ANGIOGRAM N/A 05/14/2011   Procedure: LEFT HEART CATHETERIZATION WITH CORONARY ANGIOGRAM;  Surgeon: Sherren Mocha, [name];  Location: [facility] CATH LAB;  Service: Cardiovascular;  Laterality: N/A;    non-obstructive LM, LAD, LCFx, patent RCA stent  . LUMBAR LAMINECTOMY/DECOMPRESSION MICRODISCECTOMY N/A 02/24/2017   Procedure: Microlumbar decompression L4-5, L5-S1;  Surgeon: Susa Day, [name];  Location: [facility] ORS;  Service: Orthopedics;  Laterality: N/A;  120 mins  . ROTATOR CUFF REPAIR Right 03/2002  .  ROTATOR CUFF REPAIR Left 12/11/2015  . TOTAL HIP ARTHROPLASTY Right 12/20/2008  . TOTAL HIP ARTHROPLASTY Left 07/05/2018   Procedure: TOTAL HIP ARTHROPLASTY ANTERIOR APPROACH;  Surgeon: Gaynelle Arabian, [name];  Location: [facility] ORS;  Service: Orthopedics;  Laterality: Left;  154min  . TOTAL KNEE ARTHROPLASTY Bilateral left 12-11-2003/  right 07-31-2004   dr Wynelle Link [facility]  . UPPER GASTROINTESTINAL ENDOSCOPY  01/08/2008   bx, inlet patch, duodenitis  . YAG LASER APPLICATION Right [id number]   Procedure: YAG LASER APPLICATION;  Surgeon: Williams Che, [name];  Location: [facility] OR[facility];  Service: Ophthalmology;  Laterality: Right;   Family History  Problem Relation Age of Onset  . Colon cancer Mother        Diagnosed age 61  . Cancer Mother 64  . Heart disease Father   . Heart attack Father   . Breast cancer Sister   . Heart disease Brother   . Brain cancer Sister   . Heart disease Brother   . Bladder Cancer Brother   . Cancer - Other Brother    Social History   Socioeconomic History  . Marital status: Married    Spouse name: [name]   . Number of children: 1  . Years of education: HS  . Highest education level: High school graduate  Occupational History  . Occupation: RETIRED    Employer: [employer]    Comment: Power plant  Tobacco Use  . Smoking status: Former Smoker    Packs/day: 1.00    Years: 20.00    Pack years: 20.00    Types: Cigarettes    Start date: 01/18/1963    Quit date: 08/11/1988    Years since quitting: 30.8  . Smokeless tobacco: Former Systems developer    Types: Chew    Quit date: 01/11/1989  . Tobacco comment: chewed 1 pack tobacco/day for 15 years  Substance and Sexual Activity  . Alcohol use: No    Alcohol/week: 0.0 standard drinks  . Drug use: No  . Sexual activity: Yes  Other Topics Concern  . Not on file  Social History Narrative   No regular exercise      Daily caffeine use: 3 cups   Patient is right handed.    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 Difficulty of Paying Living Expenses:   Food Insecurity:   . Worried About Charity fundraiser in the Last Year:   . Arboriculturist in the Last Year:   Transportation Needs:   . Film/video editor (Medical):   Marland Kitchen Lack of Transportation (Non-Medical):   Physical Activity:   . Days of Exercise per Week:   . Minutes of Exercise per Session:   Stress:   . Feeling of Stress :   Social Connections:   . Frequency of Communication with Friends and Family:   . Frequency of Social Gatherings with Friends and Family:   . Attends Religious Services:   . Active Member of Clubs or Organizations:   . Attends Archivist Meetings:   Marland Kitchen Marital Status:     Activities of Daily Living In your present state of health, do you have any difficulty performing the following activities: 06/07/2019 07/05/2018  Hearing? Y N  Comment right > left /no aids -  Vision? N N  Comment reading glasses -  Difficulty concentrating or making decisions? Y N  Comment takes memory med -  Walking or climbing stairs? Y Y  Comment back trouble -  Dressing or bathing? N N  Doing errands, shopping? N N  Preparing Food and eating ? N -  Using the Toilet? N -  In the past six months, have you accidently leaked urine? N -  Do you have problems with loss of bowel control? N -  Managing your Medications? N -  Managing your Finances? N -  Housekeeping or managing your Housekeeping? N -  Some recent data might be hidden    Patient Education/ Literacy    Exercise Current Exercise Habits: The patient has a physically strenuous job, but has no regular exercise apart from work., Exercise limited by: orthopedic condition(s)  Diet Patient reports consuming 3 meals a day and 2 snack(s) a day Patient reports that his primary diet is: Regular Patient reports that she does have regular access to food.    Depression Screen PHQ 2/9 Scores 06/07/2019 03/21/2019 11/29/2018 08/25/2018 06/02/2018 02/06/2018 12/30/2017  PHQ - 2 Score 1 0 0 0 1 1 5   PHQ- 9 Score - - - - - - 15     Fall Risk Fall Risk  06/07/2019 03/21/2019 11/29/2018 08/25/2018 06/02/2018  Falls in the past year? 0 0 0 0 1  Number falls in past yr: - - - - 1  Injury with Fall? - - - - 0  Comment - - - - -  Risk for fall due to : - - - - Impaired balance/gait;Impaired mobility  Follow up - - - - Education provided;Falls prevention discussed     Objective:  [name] seemed alert and oriented and he participated appropriately during our telephone visit.  Blood Pressure Weight BMI  BP Readings from Last 3 Encounters:  05/09/19 (!) 158/80  03/21/19 (!) 145/70  02/05/19 (!) 180/76   Wt Readings from Last 3 Encounters:  06/07/19 235 lb (106.6 kg)  05/09/19 237 lb 3.2 oz (107.6 kg)  03/21/19 235 lb (106.6 kg)   BMI Readings from Last 1 Encounters:  06/07/19 37.93 kg/m    *Unable to obtain current vital signs, weight, and BMI due to telephone visit type  Hearing/Vision  . Frantz did not seem  to have difficulty with hearing/understanding during the telephone conversation . Reports that he has not had a formal eye exam by an eye care professional within the past year . Reports that he has not had a formal hearing evaluation within the past year *Unable to fully assess hearing and vision during telephone visit type  Cognitive Function: 6CIT Screen 06/07/2019 06/02/2018  What Year? 0 points 0 points  What month? 0 points 0 points  What time? 0 points 0 points  Count back from 20 0 points 0 points  Months in reverse 0 points 0 points  Repeat phrase 0 points 4 points  Total Score 0 4   (Normal:0-7, Significant for Dysfunction: >8)  Normal Cognitive Function Screening: Yes   Immunization & Health Maintenance Record Immunization History  Administered Date(s) Administered  . Influenza, High Dose Seasonal PF  10/29/2015, 10/31/2017  . Influenza, Quadrivalent, Recombinant, Inj, Pf 10/19/2018  . Influenza,inj,Quad PF,6+ Mos 11/08/2013, 10/26/2016  . Influenza,inj,quad, With Preservative 10/26/2016  . Influenza-Unspecified 11/18/2014  . PFIZER SARS-COV-2 Vaccination 04/13/2019, 05/07/2019  . Pneumococcal Conjugate-13 01/01/2013, 10/26/2016  . Pneumococcal Polysaccharide-23 10/26/2016  . Tdap 01/01/2013  . Zoster Recombinat (Shingrix) 05/14/2016, 01/27/2017    Health Maintenance  Topic Date Due  . INFLUENZA VACCINE  08/12/2019  . HEMOGLOBIN A1C  09/21/2019  . FOOT EXAM  03/20/2020  . OPHTHALMOLOGY EXAM  03/25/2020  . TETANUS/TDAP  01/02/2023  . COVID-19 Vaccine  Completed  . PNA vac Low Risk Adult  Completed       Assessment  This is a routine wellness examination for [employer].  Health Maintenance: Due or Overdue There are no preventive care reminders to display for this patient.  [facility] does not need a referral for [employer] Assistance: Care Management:   no Social Work:    no Prescription Assistance:  no Nutrition/Diabetes Education:  no   Plan:  Personalized Goals Goals Addressed            This Visit's Progress   . Exercise 150 min/wk Moderate Activity   Not on track   . HEMOGLOBIN A1C < 7.0       Cont working on this    . Weight (lb) < 200 lb (90.7 kg) (pt-stated)   235 lb (106.6 kg)    Cont working on this      Personalized Health Maintenance & Screening Recommendations  up to date  Lung Cancer Screening Recommended: no (Low Dose CT Chest recommended if Age 56-80 years, 30 pack-year currently smoking OR have quit w/in past 15 years) Hepatitis C Screening recommended: no HIV Screening recommended: no  Advanced Directives: Written information was not prepared per patient's request.  Referrals & Orders No orders of the defined types were placed in this encounter.   Follow-up Plan . Follow-up with [name], [name] Kaufmann, [name] as  planned    I have personally reviewed and noted the following in the patient's chart:   . Medical and social history . Use of alcohol, tobacco or illicit drugs  . Current medications and supplements . Functional ability and status . Nutritional status . Physical activity . Advanced directives . List of other physicians . Hospitalizations, surgeries, and ER visits in previous 12 months . Vitals . Screenings to include cognitive, depression, and falls . Referrals and appointments  In addition, I have reviewed and discussed with [name] certain preventive protocols, quality metrics, and best practice recommendations. A written personalized care plan for preventive services as well as general  preventive health recommendations is available and can be mailed to the patient at his request.      [place] Dec  06/07/2019

## 2019-06-07 NOTE — Patient Instructions (Signed)
  [name] , Thank you for taking time to come for your Medicare Wellness Visit. I appreciate your ongoing commitment to your health goals. Please review the following plan we discussed and let me know if I can assist you in the future.   These are the goals we discussed: Goals    . Exercise 150 min/wk Moderate Activity    . HEMOGLOBIN A1C < 7.0     Cont working on this    . Weight (lb) < 200 lb (90.7 kg) (pt-stated)     Cont working on this       This is a list of the screening recommended for you and due dates:  Health Maintenance  Topic Date Due  . Flu Shot  08/12/2019  . Hemoglobin A1C  09/21/2019  . Complete foot exam   03/20/2020  . Eye exam for diabetics  03/25/2020  . Tetanus Vaccine  01/02/2023  . COVID-19 Vaccine  Completed  . Pneumonia vaccines  Completed

## 2019-06-20 ENCOUNTER — Other Ambulatory Visit: Payer: Self-pay | Admitting: Family Medicine

## 2019-06-22 ENCOUNTER — Ambulatory Visit (INDEPENDENT_AMBULATORY_CARE_PROVIDER_SITE_OTHER): Payer: Medicare Other | Admitting: Family Medicine

## 2019-06-22 ENCOUNTER — Other Ambulatory Visit: Payer: Self-pay

## 2019-06-22 ENCOUNTER — Encounter: Payer: Self-pay | Admitting: Family Medicine

## 2019-06-22 VITALS — BP 133/67 | HR 70 | Temp 98.0°F | Ht 66.0 in | Wt 236.0 lb

## 2019-06-22 DIAGNOSIS — E1159 Type 2 diabetes mellitus with other circulatory complications: Secondary | ICD-10-CM

## 2019-06-22 DIAGNOSIS — K219 Gastro-esophageal reflux disease without esophagitis: Secondary | ICD-10-CM

## 2019-06-22 DIAGNOSIS — N401 Enlarged prostate with lower urinary tract symptoms: Secondary | ICD-10-CM

## 2019-06-22 DIAGNOSIS — E1169 Type 2 diabetes mellitus with other specified complication: Secondary | ICD-10-CM

## 2019-06-22 DIAGNOSIS — I152 Hypertension secondary to endocrine disorders: Secondary | ICD-10-CM

## 2019-06-22 DIAGNOSIS — Z794 Long term (current) use of insulin: Secondary | ICD-10-CM

## 2019-06-22 DIAGNOSIS — E785 Hyperlipidemia, unspecified: Secondary | ICD-10-CM

## 2019-06-22 DIAGNOSIS — R351 Nocturia: Secondary | ICD-10-CM

## 2019-06-22 DIAGNOSIS — I10 Essential (primary) hypertension: Secondary | ICD-10-CM

## 2019-06-22 LAB — BAYER DCA HB A1C WAIVED: HB A1C (BAYER DCA - WAIVED): 7.9 % — ABNORMAL HIGH (ref <7.0)

## 2019-06-22 NOTE — Progress Notes (Signed)
BP 133/67   Pulse 70   Temp 98 F (36.7 C) (Temporal)   Ht 5\' 6"  (1.676 m)   Wt 236 lb (107 kg)   BMI 38.09 kg/m    Subjective:   Patient ID: [name], male    DOB: Dec 15, 1940, 79 y.o.   MRN: [id number]  HPI: [name] is a 79 y.o. male presenting on 06/22/2019 for Medical Management of Chronic Issues   HPI Type 2 diabetes mellitus Patient comes in today for recheck of his diabetes. Patient has been currently taking Humalog and Lantus and Metformin. Patient is currently on an ACE inhibitor/ARB. Patient has not seen an ophthalmologist this year. Patient denies any issues with their feet. The symptom started onset as an adult hypertension and cholesterol and GERD ARE RELATED TO DM   Hypertension Patient is currently on amlodipine and Imdur and losartan, and their blood pressure today is 133/67. Patient denies any lightheadedness or dizziness. Patient denies headaches, blurred vision, chest pains, shortness of breath, or weakness. Denies any side effects from medication and is content with current medication.   Hyperlipidemia Patient is coming in for recheck of his hyperlipidemia. The patient is currently taking atorvastatin. They deny any issues with myalgias or history of liver damage from it. They deny any focal numbness or weakness or chest pain.   GERD Patient is currently on famotidine as needed.  She denies any major symptoms or abdominal pain or belching or burping. She denies any blood in her stool or lightheadedness or dizziness.   Relevant past medical, surgical, family and social history reviewed and updated as indicated. Interim medical history since our last visit reviewed. Allergies and medications reviewed and updated.  Review of Systems  Constitutional: Negative for chills and fever.  Respiratory: Negative for shortness of breath and wheezing.   Cardiovascular: Negative for chest pain and leg swelling.  Musculoskeletal: Negative for back pain and  gait problem.  Skin: Negative for rash.  Neurological: Negative for weakness, light-headedness and numbness.  All other systems reviewed and are negative.   Per HPI unless specifically indicated above   Allergies as of 06/22/2019      Reactions   Ace Inhibitors Hives, Swelling, Rash   Rash,hives,tongue swelling   Angiotensin Receptor Blockers    Unknown reaction    Ciprofloxacin Other (See Comments)   confusion   Oxycodone-acetaminophen Other (See Comments)   Unknown reaction   Robaxin [methocarbamol] Other (See Comments)   Confusion    Toradol [ketorolac Tromethamine] Other (See Comments)   confusion   Valium Other (See Comments)   Hallucinations; confusion   Doxycycline Hives, Rash      Medication List       Accurate as of June 22, 2019 11:59 PM. If you have any questions, ask your nurse or doctor.        acetaminophen 650 MG CR tablet Commonly known as: TYLENOL Take 1,300 mg by mouth every 8 (eight) hours as needed for pain.   ACETAMINOPHEN-BUTALBITAL 50-325 MG Tabs Take by mouth 2 (two) times daily as needed.   amLODipine 5 MG tablet Commonly known as: NORVASC TAKE 1 TABLET BY MOUTH  DAILY   aspirin EC 81 MG tablet Take 81 mg by mouth daily.   atorvastatin 20 MG tablet Commonly known as: LIPITOR Take 1 tablet (20 mg total) by mouth every evening.   cetirizine 10 MG tablet Commonly known as: ZYRTEC Take 10 mg by mouth every evening.   cyclobenzaprine 10 MG tablet  Commonly known as: FLEXERIL Take 1 tablet (10 mg total) by mouth 2 (two) times daily as needed for muscle spasms.   docusate sodium 100 MG capsule Commonly known as: COLACE Take 100 mg by mouth 2 (two) times daily.   donepezil 5 MG tablet Commonly known as: Aricept Take 1 tablet (5 mg total) by mouth at bedtime.   famotidine 20 MG tablet Commonly known as: PEPCID Take 1 tablet (20 mg total) by mouth 2 (two) times daily with a meal.   glucose blood test strip Test 4 times daily Dx  E11.59   HumaLOG KwikPen 100 UNIT/ML KwikPen Generic drug: insulin lispro INJECT 0 TO 15 UNITS  SUBCUTANEOUSLY 3 TIMES  DAILY BEFORE MEALS ;  70-150=8 UNITS, 151-200= 9  UNITS.   isosorbide mononitrate 30 MG 24 hr tablet Commonly known as: IMDUR Take 1 tablet (30 mg total) by mouth every evening.   Lantus SoloStar 100 UNIT/ML Solostar Pen Generic drug: insulin glargine INJECT SUBCUTANEOUSLY 52  UNITS AT BEDTIME   losartan 50 MG tablet Commonly known as: COZAAR TAKE 1 TABLET BY MOUTH  DAILY   metFORMIN 500 MG 24 hr tablet Commonly known as: GLUCOPHAGE-XR TAKE 2 TABLETS BY MOUTH  TWICE DAILY   nitroGLYCERIN 0.4 MG SL tablet Commonly known as: NITROSTAT DISSOLVE ONE TABLET UNDER THE TONGUE EVERY 5 MINUTES AS NEEDED FOR CHEST PAIN.  DO NOT EXCEED A TOTAL OF 3 DOSES IN 15 MINUTES NOW   PARoxetine 20 MG tablet Commonly known as: PAXIL TAKE 1 TABLET BY MOUTH  DAILY   polyethylene glycol powder 17 GM/SCOOP powder Commonly known as: GLYCOLAX/MIRALAX Take 17 g by mouth as needed.   tamsulosin 0.4 MG Caps capsule Commonly known as: FLOMAX TAKE 1 CAPSULE BY MOUTH  DAILY   vitamin B-12 1000 MCG tablet Commonly known as: CYANOCOBALAMIN Take 2,000 mcg by mouth daily.   Vitamin D3 50 MCG (2000 UT) capsule Take 2,000 Units by mouth daily.        Objective:   BP 133/67   Pulse 70   Temp 98 F (36.7 C) (Temporal)   Ht 5\' 6"  (1.676 m)   Wt 236 lb (107 kg)   BMI 38.09 kg/m   Wt Readings from Last 3 Encounters:  06/22/19 236 lb (107 kg)  06/07/19 235 lb (106.6 kg)  05/09/19 237 lb 3.2 oz (107.6 kg)    Physical Exam Vitals and nursing note reviewed.  Constitutional:      General: He is not in acute distress.    Appearance: He is well-developed. He is not diaphoretic.  Eyes:     General: No scleral icterus.       Right eye: No discharge.     Conjunctiva/sclera: Conjunctivae normal.     Pupils: Pupils are equal, round, and reactive to light.  Neck:     Thyroid: No  thyromegaly.  Cardiovascular:     Rate and Rhythm: Normal rate and regular rhythm.     Heart sounds: Murmur (Crescendo decrescendo systolic murmur grade 2 out of 6) heard.   Pulmonary:     Effort: Pulmonary effort is normal. No respiratory distress.     Breath sounds: Normal breath sounds. No wheezing.  Musculoskeletal:        General: Normal range of motion.     Cervical back: Neck supple.  Lymphadenopathy:     Cervical: No cervical adenopathy.  Skin:    General: Skin is warm and dry.     Findings: No rash.  Neurological:  Mental Status: He is alert and oriented to person, place, and time.     Coordination: Coordination normal.  Psychiatric:        Behavior: Behavior normal.     Results for orders placed or performed in visit on 06/22/19  Bayer DCA Hb A1c Waived  Result Value Ref Range   HB A1C (BAYER DCA - WAIVED) 7.9 (H) <7.0 %  PSA, total and free  Result Value Ref Range   Prostate Specific Ag, Serum 0.8 0.0 - 4.0 ng/mL   PSA, Free 0.22 N/A ng/mL   PSA, Free Pct 27.5 %    Assessment & Plan:   Problem List Items Addressed This Visit      Cardiovascular and Mediastinum   Hypertension associated with diabetes ([place])     Digestive   GERD (gastroesophageal reflux disease)     Endocrine   Hyperlipidemia associated with type 2 diabetes mellitus (HCC)   Type 2 diabetes mellitus with circulatory disorder (HCC) - Primary   Relevant Orders   Bayer DCA Hb A1c Waived (Completed)     Genitourinary   BPH (benign prostatic hyperplasia)   Relevant Orders   PSA, total and free (Completed)      We are going to set him up with our clinical pharmacist and want to discuss starting him on a GLP-1 and getting prescription assistance for that such as Ozempic or Trulicity and also getting him a Dexcom to help with his blood sugar management.  A1c is 7.9 Follow up plan: Return in about 3 months (around 09/22/2019), or if symptoms worsen or fail to improve, for Type 2  diabetes.  Counseling provided for all of the vaccine components Orders Placed This Encounter  Procedures  . Bayer DCA Hb A1c Waived  . PSA, total and free    Caryl Pina, MD [facility] 06/29/2019, 5:28 PM

## 2019-06-23 LAB — PSA, TOTAL AND FREE
PSA, Free Pct: 27.5 %
PSA, Free: 0.22 ng/mL
Prostate Specific Ag, Serum: 0.8 ng/mL (ref 0.0–4.0)

## 2019-06-28 ENCOUNTER — Other Ambulatory Visit: Payer: Self-pay

## 2019-06-28 ENCOUNTER — Ambulatory Visit (INDEPENDENT_AMBULATORY_CARE_PROVIDER_SITE_OTHER): Payer: Medicare Other | Admitting: Pharmacist

## 2019-06-28 DIAGNOSIS — Z794 Long term (current) use of insulin: Secondary | ICD-10-CM

## 2019-06-28 DIAGNOSIS — E1159 Type 2 diabetes mellitus with other circulatory complications: Secondary | ICD-10-CM

## 2019-06-28 MED ORDER — DEXCOM G6 TRANSMITTER MISC: 12 refills | Status: DC

## 2019-06-28 MED ORDER — DEXCOM G6 RECEIVER DEVI: 1 refills | Status: DC

## 2019-06-28 MED ORDER — DEXCOM G6 SENSOR MISC: 4 refills | Status: DC

## 2019-06-28 NOTE — Progress Notes (Signed)
    06/28/2019 Name: [name] MRN: [id number] DOB: 10/23/1940   S:  64 yoM Presents for diabetes evaluation, education, and management Patient was referred and last seen by Primary Care Provider on 06/22/19.  Insurance coverage/medication affordability: UHC medicare  Patient reports adherence with medications. . Current diabetes medications include: lantus, humalog, metformin . Current hypertension medications include: amlodipine, losartan, imdur Goal 130/80 . Current hyperlipidemia medications include: atorvastatin  Patient denies hypoglycemic events.   Patient reported dietary habits: Eats 2-3 meals/day  Discussed meal planning options and Plate method for healthy eating  Avoid sugary drinks and desserts  Incorporate balanced protein, non starchy veggies, 1 serving of carbohydrate  Increase water intake  Increase physical activity as able.   Patient-reported exercise habits: n/a  O:  Lab Results  Component Value Date   HGBA1C 7.9 (H) 06/22/2019     Lipid Panel     Component Value Date/Time   CHOL 142 03/21/2019 1011   CHOL 139 06/02/2012 1007   TRIG 199 (H) 03/21/2019 1011   TRIG 185 (H) 04/10/2013 0937   TRIG 107 06/02/2012 1007   HDL 37 (L) 03/21/2019 1011   HDL 34 (L) 04/10/2013 0937   HDL 40 06/02/2012 1007   CHOLHDL 3.8 03/21/2019 1011   CHOLHDL 3.8 12/15/2017 0552   VLDL 30 12/15/2017 0552   LDLCALC 72 03/21/2019 1011   LDLCALC 62 04/10/2013 0937   LDLCALC 78 06/02/2012 1007   Home fasting blood sugars: n/a  2 hour post-meal/random blood sugars: n/a.   A/P:  Diabetes T2DM currently uncontrolled. Patient is able to verbalize appropriate hypoglycemia management plan. Patient is adherent with medication.   -Continued basal insulin LANTUS (insulin glargine).  Application submitted for [facility] cares patient assistance   -Continued  rapid insulin Humalog (insulin lispro)   Application submitted for l[facility] cares patient assistance    -Continue metformin  -Consider GLP1 in the near future  -Application submitted for Dexcom CGM--will allow [place] to make adjustments as needed  -Extensively discussed pathophysiology of diabetes, recommended lifestyle interventions, dietary effects on blood sugar control  -Counseled on s/sx of and management of hypoglycemia  -Next A1C anticipated 3-6 months    Written patient instructions provided.  Total time in face to face counseling 30 minutes.   Follow up PCP Clinic Visit ON 10/01/19.   Regina Eck, PharmD, BCPS Clinical Pharmacist, [place]  II Phone [phone]

## 2019-07-10 ENCOUNTER — Encounter: Payer: Self-pay | Admitting: Pharmacist

## 2019-07-15 ENCOUNTER — Other Ambulatory Visit: Payer: Self-pay | Admitting: Family Medicine

## 2019-07-17 ENCOUNTER — Ambulatory Visit (INDEPENDENT_AMBULATORY_CARE_PROVIDER_SITE_OTHER): Payer: Medicare Other | Admitting: Pharmacist

## 2019-07-17 DIAGNOSIS — E1159 Type 2 diabetes mellitus with other circulatory complications: Secondary | ICD-10-CM | POA: Diagnosis not present

## 2019-07-17 DIAGNOSIS — Z794 Long term (current) use of insulin: Secondary | ICD-10-CM

## 2019-07-17 NOTE — Progress Notes (Signed)
    07/17/2019 Name: [name] MRN: [id number] DOB: [birth year]   S:  72 yoM presents for diabetes evaluation, education, and management Patient was referred and last seen by Primary Care Provider on 06/22/19.  Insurance coverage/medication affordability: UHC medicare  I connected with  [name] on 07/17/19 by a video enabled telemedicine application and verified that I am speaking with the correct person using two identifiers.   I discussed the limitations of evaluation and management by telemedicine. The patient expressed understanding and agreed to proceed.  Patient reports adherence with medications.  Current diabetes medications include: lantus, humalog, metformin  Current hypertension medications include: amlodipine, losartan, imdur Goal 130/80  Current hyperlipidemia medications include: atorvastatin  Patient denies hypoglycemic events.   Patient reported dietary habits: Eats 2-3 meals/day  Discussed meal planning options and Plate method for healthy eating  Avoid sugary drinks and desserts  Incorporate balanced protein, non starchy veggies, 1 serving of carbohydrate  Increase water intake  Increase physical activity as able.   Patient-reported exercise habits: n/a  O:  Lab Results  Component Value Date   HGBA1C 7.9 (H) 06/22/2019   Lipid Panel     Component Value Date/Time   CHOL 142 03/21/2019 1011   CHOL 139 06/02/2012 1007   TRIG 199 (H) 03/21/2019 1011   TRIG 185 (H) 04/10/2013 0937   TRIG 107 06/02/2012 1007   HDL 37 (L) 03/21/2019 1011   HDL 34 (L) 04/10/2013 0937   HDL 40 06/02/2012 1007   CHOLHDL 3.8 03/21/2019 1011   CHOLHDL 3.8 12/15/2017 0552   VLDL 30 12/15/2017 0552   LDLCALC 72 03/21/2019 1011   LDLCALC 62 04/10/2013 0937   LDLCALC 78 06/02/2012 1007   Home fasting blood sugars: 130-160 108 at bedtime 166 this AM  2 hour post-meal/random blood sugars: n/a.   A/P:  Diabetes T2DM with A1c slowly increasing.  Patient is able to verbalize appropriate hypoglycemia management plan. Patient is adherent with medication. Control is suboptimal due to diet/lifestyle.  Diabetes T2DM currently uncontrolled. Patient is able to verbalize appropriate hypoglycemia management plan. Patient is adherent with medication.   -Continued basal insulin LANTUS (insulin glargine).  52 units sq bedtime             Patient has been approved for l[facility] cares patient assistance until 01/11/2020  -Continued  rapid insulin Humalog (insulin lispro)   15 units with meals             Patient has been approved for l[facility] cares patient assistance until 01/11/2020  -Continue metformin  -Consider SGLT2 vs GLP1 in the near future  -Application submitted for Dexcom CGM  -Approved for Dexcom with no cost--still awaiting paperwork from [facility] medical  -Extensively discussed pathophysiology of diabetes, recommended lifestyle interventions, dietary effects on blood sugar control  -Counseled on s/sx of and management of hypoglycemia  -Next A1C anticipated 09/2019  Written patient instructions provided.  Total time in counseling 20 minutes.   Follow up PCP Clinic Visit ON 10/01/19   Regina Eck, PharmD, BCPS Clinical Pharmacist, [place]  II Phone [phone]

## 2019-07-19 ENCOUNTER — Telehealth: Payer: Self-pay | Admitting: Pharmacist

## 2019-07-19 DIAGNOSIS — E119 Type 2 diabetes mellitus without complications: Secondary | ICD-10-CM | POA: Diagnosis not present

## 2019-07-19 DIAGNOSIS — Z794 Long term (current) use of insulin: Secondary | ICD-10-CM | POA: Diagnosis not present

## 2019-07-19 NOTE — Telephone Encounter (Signed)
Dexcom G6 approved via [employer] Patient will receive their equipment and supplies within 3-5 days  For questions, email [email or web address]  Rep name: Joellen Jersey Phone: [phone] Fax: [phone]  Encouraged patient to make an appt to set up device once he receives

## 2019-07-23 ENCOUNTER — Telehealth: Payer: Self-pay | Admitting: Pharmacist

## 2019-07-23 NOTE — Telephone Encounter (Signed)
Patient received dexcom in the mail Will set up Dexcom at appt scheduled for 7/15 @3 :30pm

## 2019-07-26 ENCOUNTER — Other Ambulatory Visit: Payer: Self-pay | Admitting: Family Medicine

## 2019-07-26 ENCOUNTER — Other Ambulatory Visit: Payer: Self-pay

## 2019-07-26 ENCOUNTER — Ambulatory Visit (INDEPENDENT_AMBULATORY_CARE_PROVIDER_SITE_OTHER): Payer: Medicare Other | Admitting: Pharmacist

## 2019-07-26 DIAGNOSIS — I152 Hypertension secondary to endocrine disorders: Secondary | ICD-10-CM

## 2019-07-26 DIAGNOSIS — E1159 Type 2 diabetes mellitus with other circulatory complications: Secondary | ICD-10-CM

## 2019-07-26 DIAGNOSIS — Z794 Long term (current) use of insulin: Secondary | ICD-10-CM | POA: Diagnosis not present

## 2019-07-26 DIAGNOSIS — E1129 Type 2 diabetes mellitus with other diabetic kidney complication: Secondary | ICD-10-CM

## 2019-07-26 DIAGNOSIS — R809 Proteinuria, unspecified: Secondary | ICD-10-CM

## 2019-07-26 NOTE — Progress Notes (Signed)
    07/26/2019 Name: [name] MRN: [id number] DOB: [birth year]   S:  22 yoM presents for diabetes evaluation, education, and management.  His wife is with him and is involved in patient's care Patient was referred and last seen by Primary Care Provider on 06/22/19.  Insurance coverage/medication affordability: UHC medicare  Patient reports adherence with medications. . Current diabetes medications include: metformin, lantus, humalog . Current hypertension medications include: amlodipine, isosorbide, losartan Goal 130/80 . Current hyperlipidemia medications include: atorvastatin   Patient denies hypoglycemic events.   Patient reported dietary habits: Eats 2-3 meals/day Discussed meal planning options and Plate method for healthy eating . Avoid sugary drinks and desserts . Incorporate balanced protein, non starchy veggies, 1 serving of carbohydrate with each meal . Increase water intake . Increase physical activity as able  Patient-reported exercise habits: n/a  O:  Lab Results  Component Value Date   HGBA1C 7.9 (H) 06/22/2019   Lipid Panel     Component Value Date/Time   CHOL 142 03/21/2019 1011   CHOL 139 06/02/2012 1007   TRIG 199 (H) 03/21/2019 1011   TRIG 185 (H) 04/10/2013 0937   TRIG 107 06/02/2012 1007   HDL 37 (L) 03/21/2019 1011   HDL 34 (L) 04/10/2013 0937   HDL 40 06/02/2012 1007   CHOLHDL 3.8 03/21/2019 1011   CHOLHDL 3.8 12/15/2017 0552   VLDL 30 12/15/2017 0552   LDLCALC 72 03/21/2019 1011   LDLCALC 62 04/10/2013 0937   LDLCALC 78 06/02/2012 1007    Home fasting blood sugars: Home fasting blood sugars: 130-170 105 at bedtime 130 this AM  2 hour post-meal/random blood sugars: n/a--setting up dexcom, will have a variety of readings at f/u.   A/P:  Diabetes T2DM with A1c slowly increasing. Patient is able to verbalize appropriate hypoglycemia management plan. Patient is adherent with medication. Control is suboptimal due to  diet/lifestyle.  -Continuedbasal insulin LANTUS(insulin glargine).             52 units sq bedtime Patient has been approved for l[facility] cares patient assistanceuntil 01/11/2020  -Continuedrapid insulin Humalog(insulin lispro)             15 units with meals Patient has been approved for l[facility] cares patient assistanceuntil 01/11/2020  -Continue metformin  -Consider SGLT2 vs GLP1 in the near future  -Dexcom G6 CGM system set up for patient and wife  -Extensively discussed pathophysiology of diabetes, recommended lifestyle interventions, dietary effects on blood sugar control  -Counseled on s/sx of and management of hypoglycemia  -Next A1C anticipated 10/01/19.    Written patient instructions provided.  Total time in face to face counseling 30 minutes.   Follow up Pharmacist in 2 weeks/PCP Clinic Visit ON 10/01/19.   Regina Eck, PharmD, BCPS Clinical Pharmacist, [place]  II Phone [phone]

## 2019-07-27 ENCOUNTER — Telehealth: Payer: Self-pay | Admitting: Family Medicine

## 2019-07-27 NOTE — Telephone Encounter (Signed)
Pts wife called requesting to speak with Almyra Free regarding the Dexcom that pt had put on yesterday.

## 2019-07-27 NOTE — Telephone Encounter (Signed)
Dexcom vs fingerstick readings  After lunch Dexcom 278, fingerstick 239 FBG dexcom 239, fingerstick 139 FBG 191 dexc, fingerstick 147  Will reach out to dexcom rep to discuss readings

## 2019-07-27 NOTE — Telephone Encounter (Signed)
Talked with Dexcom Rep  Instructed patient to call Dexcom [employer] [phone] to assist with calibration and additional set up needs

## 2019-07-30 DIAGNOSIS — G4733 Obstructive sleep apnea (adult) (pediatric): Secondary | ICD-10-CM | POA: Diagnosis not present

## 2019-08-01 ENCOUNTER — Encounter: Payer: Self-pay | Admitting: Pharmacist

## 2019-08-07 ENCOUNTER — Telehealth: Payer: Self-pay | Admitting: Pharmacist

## 2019-08-15 ENCOUNTER — Encounter: Payer: Self-pay | Admitting: Adult Health

## 2019-08-15 ENCOUNTER — Telehealth: Payer: Self-pay | Admitting: Family Medicine

## 2019-08-15 ENCOUNTER — Ambulatory Visit: Payer: Medicare Other | Admitting: Adult Health

## 2019-08-15 VITALS — BP 165/75 | HR 71 | Ht 66.0 in | Wt 230.0 lb

## 2019-08-15 DIAGNOSIS — G4733 Obstructive sleep apnea (adult) (pediatric): Secondary | ICD-10-CM

## 2019-08-15 DIAGNOSIS — Z9989 Dependence on other enabling machines and devices: Secondary | ICD-10-CM | POA: Diagnosis not present

## 2019-08-15 NOTE — Progress Notes (Signed)
PATIENT: [name] DOB: Mar 07, 1940  REASON FOR VISIT: follow up HISTORY FROM: patient  HISTORY OF PRESENT ILLNESS: Today 08/15/19:  [name] is a 79 year old male with a history of obstructive sleep apnea on CPAP.  His download indicates that he uses machine nightly for compliance of 100%.  He uses machine greater than 4 hours each night.  On average he uses his machine 8 hours and 38 minutes.  His residual AHI is 3.4 on 9 cm of water with EPR 3.  Reports that the CPAP is working well for him.  He returns today for an evaluation.  HISTORY 08/15/18:  [name] is a 79 year old male with a history of obstructive sleep apnea on CPAP.  His CPAP download indicates that he uses machine nightly for compliance of 100%.  He uses machine greater than 4 hours 29 out of 30 days for compliance of 97%.  On average he uses his machine 7 hours and 23 minutes.  His residual AHI is 2 on 8 cm of water with EPR of 3.  His leak in the 95th percentile is 10.9.  He reports that some nights he does not feel that he is getting enough air.  He is wondering if we can increase the pressure.  He returns today for follow-up.  REVIEW OF SYSTEMS: Out of a complete 14 system review of symptoms, the patient complains only of the following symptoms, and all other reviewed systems are negative.  FSS  8 ESS 47  ALLERGIES: Allergies  Allergen Reactions  . Ace Inhibitors Hives, Swelling and Rash    Rash,hives,tongue swelling  . Angiotensin Receptor Blockers     Unknown reaction   . Ciprofloxacin Other (See Comments)    confusion  . Oxycodone-Acetaminophen Other (See Comments)    Unknown reaction  . Robaxin [Methocarbamol] Other (See Comments)    Confusion   . Toradol [Ketorolac Tromethamine] Other (See Comments)    confusion  . Valium Other (See Comments)    Hallucinations; confusion  . Doxycycline Hives and Rash    HOME MEDICATIONS: Outpatient Medications Prior to Visit  Medication Sig  Dispense Refill  . acetaminophen (TYLENOL) 650 MG CR tablet Take 1,300 mg by mouth every 8 (eight) hours as needed for pain.     . ACETAMINOPHEN-BUTALBITAL 50-325 MG TABS Take by mouth 2 (two) times daily as needed.    Marland Kitchen amLODipine (NORVASC) 5 MG tablet TAKE 1 TABLET BY MOUTH  DAILY 90 tablet 3  . aspirin EC 81 MG tablet Take 81 mg by mouth daily.    Marland Kitchen atorvastatin (LIPITOR) 20 MG tablet Take 1 tablet (20 mg total) by mouth every evening. 90 tablet 3  . cetirizine (ZYRTEC) 10 MG tablet Take 10 mg by mouth every evening.     . Cholecalciferol (VITAMIN D3) 50 MCG (2000 UT) capsule Take 2,000 Units by mouth daily.    . Continuous Blood Gluc Receiver (DEXCOM G6 RECEIVER) DEVI USE TO CHECK BLOOD SUGAR UP TO 4 TIMES DAILY AS DIRECTED. 1 DEVICE PER YEAR. DX: E11.9 1 each 1  . Continuous Blood Gluc Sensor (DEXCOM G6 SENSOR) MISC USE TO CHECK BLOOD SUGAR UP TO 4 TIMES DAILY AS DIRECTED. CHANGE SENSOR EVERY 30 DAYS. DX: E11.9 3 each 4  . Continuous Blood Gluc Transmit (DEXCOM G6 TRANSMITTER) MISC USE TO CHECK BLOOD SUGAR UP TO 4 TIMES DAILY AS DIRECTED. CHANGE TRANSMITTER EVERY 3 MONTHS. DX: E11.9 1 each 12  . docusate sodium (COLACE) 100 MG capsule Take 100  mg by mouth 2 (two) times daily.    . famotidine (PEPCID) 20 MG tablet Take 1 tablet (20 mg total) by mouth 2 (two) times daily with a meal. 180 tablet 3  . glucose blood test strip Test 4 times daily Dx E11.59 400 each 3  . HUMALOG KWIKPEN 100 UNIT/ML KwikPen INJECT 0 TO 15 UNITS  SUBCUTANEOUSLY 3 TIMES  DAILY BEFORE MEALS ;  70-150=8 UNITS, 151-200= 9  UNITS. 45 mL 0  . LANTUS SOLOSTAR 100 UNIT/ML Solostar Pen INJECT SUBCUTANEOUSLY 52  UNITS AT BEDTIME 60 mL 3  . losartan (COZAAR) 50 MG tablet TAKE 1 TABLET BY MOUTH  DAILY 90 tablet 3  . metFORMIN (GLUCOPHAGE-XR) 500 MG 24 hr tablet TAKE 2 TABLETS BY MOUTH  TWICE DAILY 360 tablet 0  . nitroGLYCERIN (NITROSTAT) 0.4 MG SL tablet DISSOLVE ONE TABLET UNDER THE TONGUE EVERY 5 MINUTES AS NEEDED FOR CHEST  PAIN.  DO NOT EXCEED A TOTAL OF 3 DOSES IN 15 MINUTES NOW 25 tablet 0  . PARoxetine (PAXIL) 20 MG tablet TAKE 1 TABLET BY MOUTH  DAILY 90 tablet 3  . polyethylene glycol powder (GLYCOLAX/MIRALAX) 17 GM/SCOOP powder Take 17 g by mouth as needed.    . tamsulosin (FLOMAX) 0.4 MG CAPS capsule TAKE 1 CAPSULE BY MOUTH  DAILY 90 capsule 3  . vitamin B-12 (CYANOCOBALAMIN) 1000 MCG tablet Take 2,000 mcg by mouth daily.    . isosorbide mononitrate (IMDUR) 30 MG 24 hr tablet Take 1 tablet (30 mg total) by mouth every evening. 90 tablet 1  . cyclobenzaprine (FLEXERIL) 10 MG tablet Take 1 tablet (10 mg total) by mouth 2 (two) times daily as needed for muscle spasms. 20 tablet 0   No facility-administered medications prior to visit.    PAST MEDICAL HISTORY: Past Medical History:  Diagnosis Date  . Anxiety   . Aortic atherosclerosis ([place]) 12/19/2017  . Arthritis   . Bursitis of hip   . Cervical spondylosis   . Chronic headaches   . Coronary atherosclerosis of native coronary artery    BMS nondominant RCA 12/2004  . Essential hypertension   . GERD (gastroesophageal reflux disease)   . History of adenomatous polyp of colon   . History of kidney stones   . History of MI (myocardial infarction) 12/2004  . History of viral meningitis 02/28/2018   December 2019  . Hyperlipidemia   . Internal hemorrhoids   . Low back pain   . Nocturia more than twice per night   . OSA (obstructive sleep apnea)   . Spinal stenosis   . Type 2 diabetes mellitus treated with insulin ([place])   . Wears dentures    Upper    PAST SURGICAL HISTORY: Past Surgical History:  Procedure Laterality Date  . CARDIAC CATHETERIZATION  06-14-2004  dr Lia Foyer   total occlusion mD1, RI 30-40%, mRCA nondominant hazy 80% and scattered 30-40%,  ef 71% (medical mangement)  . CARDIOVASCULAR STRESS TEST  09-17-2015   dr Domenic Polite   Low risk nuclear study w/ reversible mild small anteroapical defect,  normal LV function and wall motion,  nuclear stress ef 61%  . CATARACT EXTRACTION W/ INTRAOCULAR LENS IMPLANT Right 07/2014  . CHOLECYSTECTOMY    . COLONOSCOPY  09/10/2008   normal  . CORONARY ANGIOPLASTY WITH STENT PLACEMENT  12-17-2004   dr benismhon/ dr Olevia Perches   nonobstructive cad LAD and LCFx,  BMS x1 to total occlusion RCA   . CYSTOSCOPY W/ URETEROSCOPY W/ LITHOTRIPSY  08/2010  .  ERCP  03/14/2008  . EYE SURGERY Bilateral    cataracts  . LAMINECTOMY    . LAPAROSCOPIC CHOLECYSTECTOMY  05/2012  . LEFT HEART CATHETERIZATION WITH CORONARY ANGIOGRAM N/A 05/14/2011   Procedure: LEFT HEART CATHETERIZATION WITH CORONARY ANGIOGRAM;  Surgeon: Sherren Mocha, MD;  Location: [facility] CATH LAB;  Service: Cardiovascular;  Laterality: N/A;    non-obstructive LM, LAD, LCFx, patent RCA stent  . LUMBAR LAMINECTOMY/DECOMPRESSION MICRODISCECTOMY N/A 02/24/2017   Procedure: Microlumbar decompression L4-5, L5-S1;  Surgeon: Susa Day, MD;  Location: [facility] ORS;  Service: Orthopedics;  Laterality: N/A;  120 mins  . ROTATOR CUFF REPAIR Right 03/2002  . ROTATOR CUFF REPAIR Left 12/11/2015  . TOTAL HIP ARTHROPLASTY Right 12/20/2008  . TOTAL HIP ARTHROPLASTY Left 07/05/2018   Procedure: TOTAL HIP ARTHROPLASTY ANTERIOR APPROACH;  Surgeon: Gaynelle Arabian, MD;  Location: [facility] ORS;  Service: Orthopedics;  Laterality: Left;  118min  . TOTAL KNEE ARTHROPLASTY Bilateral left 12-11-2003/  right 07-31-2004   dr Wynelle Link [facility]  . UPPER GASTROINTESTINAL ENDOSCOPY  01/08/2008   bx, inlet patch, duodenitis  . YAG LASER APPLICATION Right 04/19/1446   Procedure: YAG LASER APPLICATION;  Surgeon: Williams Che, MD;  Location: [facility] ORS;  Service: Ophthalmology;  Laterality: Right;    FAMILY HISTORY: Family History  Problem Relation Age of Onset  . Colon cancer Mother        Diagnosed age 66  . Cancer Mother 56  . Heart disease Father   . Heart attack Father   . Breast cancer Sister   . Heart disease Brother   . Brain cancer Sister   . Heart disease Brother   . Bladder  Cancer Brother   . Cancer - Other Brother     SOCIAL HISTORY: Social History   Socioeconomic History  . Marital status: Married    Spouse name: Dione   . Number of children: 1  . Years of education: HS  . Highest education level: High school graduate  Occupational History  . Occupation: RETIRED    Employer: [employer]    Comment: Power plant  Tobacco Use  . Smoking status: Former Smoker    Packs/day: 1.00    Years: 20.00    Pack years: 20.00    Types: Cigarettes    Start date: 01/18/1963    Quit date: 08/11/1988    Years since quitting: 31.0  . Smokeless tobacco: Former Systems developer    Types: Chew    Quit date: 01/11/1989  . Tobacco comment: chewed 1 pack tobacco/day for 15 years  Vaping Use  . Vaping Use: Never used  Substance and Sexual Activity  . Alcohol use: No    Alcohol/week: 0.0 standard drinks  . Drug use: No  . Sexual activity: Yes  Other Topics Concern  . Not on file  Social History Narrative   No regular exercise      Daily caffeine use: 3 cups   Patient is right handed.    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 Difficulty of Paying Living Expenses:   Food Insecurity:   . Worried About Charity fundraiser in the Last Year:   . Arboriculturist in the Last Year:   Transportation Needs:   . Film/video editor (Medical):   Marland Kitchen Lack of Transportation (Non-Medical):   Physical Activity:   .  Days of Exercise per Week:   . Minutes of Exercise per Session:   Stress:   . Feeling of Stress :   Social Connections:   . Frequency of Communication with Friends and Family:   . Frequency of Social Gatherings with Friends and Family:   . Attends Religious Services:   . Active Member of Clubs or Organizations:   . Attends Archivist Meetings:   Marland Kitchen Marital Status:   Intimate Partner Violence:   . Fear of Current or  Ex-Partner:   . Emotionally Abused:   Marland Kitchen Physically Abused:   . Sexually Abused:       PHYSICAL EXAM  Vitals:   08/15/19 0924  BP: (!) 165/75  Pulse: 71  Weight: 230 lb (104.3 kg)  Height: 5\' 6"  (1.676 m)   Body mass index is 37.12 kg/m.  Generalized: Well developed, in no acute distress  Chest: Lungs clear to auscultation bilaterally  Neurological examination  Mentation: Alert oriented to time, place, history taking. Follows all commands speech and language fluent Cranial nerve II-XII: Extraocular movements were full, visual field were full on confrontational test Head turning and shoulder shrug  were normal and symmetric. Motor: The motor testing reveals 5 over 5 strength of all 4 extremities. Good symmetric motor tone is noted throughout.  Sensory: Sensory testing is intact to soft touch on all 4 extremities. No evidence of extinction is noted.  Gait and station: Gait is normal.    DIAGNOSTIC DATA (LABS, IMAGING, TESTING) - I reviewed patient records, labs, notes, testing and imaging myself where available.  Lab Results  Component Value Date   WBC 6.3 03/21/2019   HGB 13.2 03/21/2019   HCT 40.8 03/21/2019   MCV 84 03/21/2019   PLT 281 03/21/2019      Component Value Date/Time   NA 138 03/21/2019 1011   K 4.0 03/21/2019 1011   CL 101 03/21/2019 1011   CO2 21 03/21/2019 1011   GLUCOSE 227 (H) 03/21/2019 1011   GLUCOSE 228 (H) 07/06/2018 0250   BUN 12 03/21/2019 1011   CREATININE 0.86 03/21/2019 1011   CREATININE 0.93 06/02/2012 1007   CALCIUM 9.0 03/21/2019 1011   PROT 6.8 03/21/2019 1011   ALBUMIN 3.7 03/21/2019 1011   AST 15 03/21/2019 1011   ALT 15 03/21/2019 1011   ALKPHOS 73 03/21/2019 1011   BILITOT 0.4 03/21/2019 1011   GFRNONAA 82 03/21/2019 1011   GFRNONAA 82 06/02/2012 1007   GFRAA 95 03/21/2019 1011   GFRAA >89 06/02/2012 1007   Lab Results  Component Value Date   CHOL 142 03/21/2019   HDL 37 (L) 03/21/2019   LDLCALC 72 03/21/2019    TRIG 199 (H) 03/21/2019   CHOLHDL 3.8 03/21/2019   Lab Results  Component Value Date   HGBA1C 7.9 (H) 06/22/2019   Lab Results  Component Value Date   VITAMINB12 1,002 02/28/2018   Lab Results  Component Value Date   TSH 0.930 12/15/2017      ASSESSMENT AND PLAN 79 y.o. year old male  has a past medical history of Anxiety, Aortic atherosclerosis ([place]) (12/19/2017), Arthritis, Bursitis of hip, Cervical spondylosis, Chronic headaches, Coronary atherosclerosis of native coronary artery, Essential hypertension, GERD (gastroesophageal reflux disease), History of adenomatous polyp of colon, History of kidney stones, History of MI (myocardial infarction) (12/2004), History of viral meningitis (02/28/2018), Hyperlipidemia, Internal hemorrhoids, Low back pain, Nocturia more than twice per night, OSA (obstructive sleep apnea), Spinal stenosis, Type 2 diabetes mellitus treated with insulin (  [place]), and Wears dentures. here with:  1. OSA on CPAP  - CPAP compliance excellent - Good treatment of AHI  - Encourage patient to use CPAP nightly and > 4 hours each night - F/U in 1 year or sooner if needed   I spent 20 minutes of face-to-face and non-face-to-face time with patient.  This included previsit chart review, lab review, study review, order entry, electronic health record documentation, patient education.  Ward Givens, MSN, NP-C 08/15/2019, 9:34 AM [facility] [address], [place], [place] [address] [phone]

## 2019-08-15 NOTE — Telephone Encounter (Signed)
Patient dexcom malfunctioned Will leave dexcom sample up front for patient  Wife verbalizes understanding  Reporting BGs as low as 90--catching early and knows to eat snack

## 2019-08-15 NOTE — Patient Instructions (Signed)
Continue using CPAP nightly and greater than 4 hours each night °If your symptoms worsen or you develop new symptoms please let us know.  ° °

## 2019-09-09 ENCOUNTER — Other Ambulatory Visit: Payer: Self-pay | Admitting: Family Medicine

## 2019-09-10 ENCOUNTER — Other Ambulatory Visit: Payer: Self-pay | Admitting: Cardiology

## 2019-09-10 ENCOUNTER — Other Ambulatory Visit: Payer: Self-pay | Admitting: Family Medicine

## 2019-10-01 ENCOUNTER — Other Ambulatory Visit: Payer: Self-pay

## 2019-10-01 ENCOUNTER — Encounter: Payer: Self-pay | Admitting: Family Medicine

## 2019-10-01 ENCOUNTER — Ambulatory Visit (INDEPENDENT_AMBULATORY_CARE_PROVIDER_SITE_OTHER): Payer: Medicare Other | Admitting: Family Medicine

## 2019-10-01 VITALS — BP 150/71 | HR 75 | Temp 98.1°F | Ht 66.0 in | Wt 238.0 lb

## 2019-10-01 DIAGNOSIS — E1129 Type 2 diabetes mellitus with other diabetic kidney complication: Secondary | ICD-10-CM | POA: Diagnosis not present

## 2019-10-01 DIAGNOSIS — I10 Essential (primary) hypertension: Secondary | ICD-10-CM | POA: Diagnosis not present

## 2019-10-01 DIAGNOSIS — E1169 Type 2 diabetes mellitus with other specified complication: Secondary | ICD-10-CM

## 2019-10-01 DIAGNOSIS — E1159 Type 2 diabetes mellitus with other circulatory complications: Secondary | ICD-10-CM | POA: Diagnosis not present

## 2019-10-01 DIAGNOSIS — R809 Proteinuria, unspecified: Secondary | ICD-10-CM

## 2019-10-01 DIAGNOSIS — Z1159 Encounter for screening for other viral diseases: Secondary | ICD-10-CM

## 2019-10-01 DIAGNOSIS — I152 Hypertension secondary to endocrine disorders: Secondary | ICD-10-CM

## 2019-10-01 DIAGNOSIS — Z794 Long term (current) use of insulin: Secondary | ICD-10-CM | POA: Diagnosis not present

## 2019-10-01 DIAGNOSIS — E785 Hyperlipidemia, unspecified: Secondary | ICD-10-CM | POA: Diagnosis not present

## 2019-10-01 LAB — BAYER DCA HB A1C WAIVED: HB A1C (BAYER DCA - WAIVED): 7 % — ABNORMAL HIGH (ref <7.0)

## 2019-10-01 NOTE — Progress Notes (Signed)
BP (!) 150/71   Pulse 75   Temp 98.1 F (36.7 C)   Ht 5' 6" (1.676 m)   Wt 238 lb (108 kg)   SpO2 96%   BMI 38.41 kg/m    Subjective:   Patient ID: [name], male    DOB: 1940/12/21, 79 y.o.   MRN: [id number]  HPI: [name] is a 79 y.o. male presenting on 10/01/2019 for Medical Management of Chronic Issues and Diabetes   HPI Type 2 diabetes mellitus Patient comes in today for recheck of his diabetes. Patient has been currently taking Humalog and Lantus and Metformin. Patient is currently on an ACE inhibitor/ARB. Patient has seen an ophthalmologist this year. Patient denies any issues with their feet. The symptom started onset as an adult hypertension and hyperlipidemia ARE RELATED TO DM   Hypertension Patient is currently on losartan and amlodipine Imdur, and their blood pressure today is 150/71. Patient denies any lightheadedness or dizziness. Patient denies headaches, blurred vision, chest pains, shortness of breath, or weakness. Denies any side effects from medication and is content with current medication.   Hyperlipidemia Patient is coming in for recheck of his hyperlipidemia. The patient is currently taking atorvastatin. They deny any issues with myalgias or history of liver damage from it. They deny any focal numbness or weakness or chest pain.   Relevant past medical, surgical, family and social history reviewed and updated as indicated. Interim medical history since our last visit reviewed. Allergies and medications reviewed and updated.  Review of Systems  Constitutional: Negative for chills and fever.  Eyes: Negative for visual disturbance.  Respiratory: Negative for shortness of breath and wheezing.   Cardiovascular: Negative for chest pain and leg swelling.  Musculoskeletal: Negative for back pain and gait problem.  Skin: Negative for rash.  Neurological: Negative for dizziness, weakness and light-headedness.  All other systems reviewed and are  negative.   Per HPI unless specifically indicated above   Allergies as of 10/01/2019      Reactions   Ace Inhibitors Hives, Swelling, Rash   Rash,hives,tongue swelling   Angiotensin Receptor Blockers    Unknown reaction    Ciprofloxacin Other (See Comments)   confusion   Oxycodone-acetaminophen Other (See Comments)   Unknown reaction   Robaxin [methocarbamol] Other (See Comments)   Confusion    Toradol [ketorolac Tromethamine] Other (See Comments)   confusion   Valium Other (See Comments)   Hallucinations; confusion   Doxycycline Hives, Rash      Medication List       Accurate as of October 01, 2019  9:08 AM. If you have any questions, ask your nurse or doctor.        acetaminophen 650 MG CR tablet Commonly known as: TYLENOL Take 1,300 mg by mouth every 8 (eight) hours as needed for pain.   ACETAMINOPHEN-BUTALBITAL 50-325 MG Tabs Take by mouth 2 (two) times daily as needed.   amLODipine 5 MG tablet Commonly known as: NORVASC TAKE 1 TABLET BY MOUTH  DAILY   aspirin EC 81 MG tablet Take 81 mg by mouth daily.   atorvastatin 20 MG tablet Commonly known as: LIPITOR TAKE 1 TABLET BY MOUTH IN  THE EVENING   cetirizine 10 MG tablet Commonly known as: ZYRTEC Take 10 mg by mouth every evening.   Dexcom G6 Receiver Devi USE TO CHECK BLOOD SUGAR UP TO 4 TIMES DAILY AS DIRECTED. 1 DEVICE PER YEAR. DX: E11.9   Dexcom G6 Sensor Misc USE TO  CHECK BLOOD SUGAR UP TO 4 TIMES DAILY AS DIRECTED. CHANGE SENSOR EVERY 30 DAYS. DX: E11.9   Dexcom G6 Transmitter Misc USE TO CHECK BLOOD SUGAR UP TO 4 TIMES DAILY AS DIRECTED. CHANGE TRANSMITTER EVERY 3 MONTHS. DX: E11.9   docusate sodium 100 MG capsule Commonly known as: COLACE Take 100 mg by mouth 2 (two) times daily.   famotidine 20 MG tablet Commonly known as: PEPCID TAKE 1 TABLET BY MOUTH  TWICE DAILY WITH MEALS   glucose blood test strip Test 4 times daily Dx E11.59   HumaLOG KwikPen 100 UNIT/ML KwikPen Generic  drug: insulin lispro INJECT 0 TO 15 UNITS  SUBCUTANEOUSLY 3 TIMES  DAILY BEFORE MEALS ;  70-150=8 UNITS, 151-200= 9  UNITS.   isosorbide mononitrate 30 MG 24 hr tablet Commonly known as: IMDUR TAKE 1 TABLET BY MOUTH IN  THE EVENING   Lantus SoloStar 100 UNIT/ML Solostar Pen Generic drug: insulin glargine INJECT SUBCUTANEOUSLY 52  UNITS AT BEDTIME   losartan 50 MG tablet Commonly known as: COZAAR TAKE 1 TABLET BY MOUTH  DAILY   metFORMIN 500 MG 24 hr tablet Commonly known as: GLUCOPHAGE-XR TAKE 2 TABLETS BY MOUTH  TWICE DAILY   nitroGLYCERIN 0.4 MG SL tablet Commonly known as: NITROSTAT DISSOLVE ONE TABLET UNDER THE TONGUE EVERY 5 MINUTES AS NEEDED FOR CHEST PAIN.  DO NOT EXCEED A TOTAL OF 3 DOSES IN 15 MINUTES NOW   PARoxetine 20 MG tablet Commonly known as: PAXIL TAKE 1 TABLET BY MOUTH  DAILY   polyethylene glycol powder 17 GM/SCOOP powder Commonly known as: GLYCOLAX/MIRALAX Take 17 g by mouth as needed.   tamsulosin 0.4 MG Caps capsule Commonly known as: FLOMAX TAKE 1 CAPSULE BY MOUTH  DAILY   Turmeric 500 MG Caps Take by mouth daily.   vitamin B-12 1000 MCG tablet Commonly known as: CYANOCOBALAMIN Take 2,000 mcg by mouth daily.   Vitamin D3 50 MCG (2000 UT) capsule Take 2,000 Units by mouth daily.        Objective:   BP (!) 150/71   Pulse 75   Temp 98.1 F (36.7 C)   Ht 5' 6" (1.676 m)   Wt 238 lb (108 kg)   SpO2 96%   BMI 38.41 kg/m   Wt Readings from Last 3 Encounters:  10/01/19 238 lb (108 kg)  08/15/19 230 lb (104.3 kg)  06/22/19 236 lb (107 kg)    Physical Exam Vitals and nursing note reviewed.  Constitutional:      General: He is not in acute distress.    Appearance: He is well-developed. He is not diaphoretic.  Eyes:     General: No scleral icterus.    Conjunctiva/sclera: Conjunctivae normal.  Neck:     Thyroid: No thyromegaly.  Cardiovascular:     Rate and Rhythm: Normal rate and regular rhythm.     Heart sounds: Murmur  (Machinelike sounding murmur, diastolic, heard at the left intercostal space both second and fourth) heard.   Pulmonary:     Effort: Pulmonary effort is normal. No respiratory distress.     Breath sounds: Normal breath sounds. No wheezing.  Musculoskeletal:        General: Normal range of motion.     Cervical back: Neck supple.  Lymphadenopathy:     Cervical: No cervical adenopathy.  Skin:    General: Skin is warm and dry.     Findings: No rash.  Neurological:     Mental Status: He is alert and oriented to person,  place, and time.     Coordination: Coordination normal.  Psychiatric:        Behavior: Behavior normal.       Assessment & Plan:   Problem List Items Addressed This Visit      Cardiovascular and Mediastinum   Hypertension associated with diabetes ([place])   Relevant Orders   CMP14+EGFR     Endocrine   Hyperlipidemia associated with type 2 diabetes mellitus ([place])   Relevant Orders   Lipid panel   Type 2 diabetes mellitus with circulatory disorder ([place]) - Primary   Relevant Orders   Bayer DCA Hb A1c Waived   CBC with Differential/Platelet   CMP14+EGFR   Microalbuminuria due to type 2 diabetes mellitus ([place])    Other Visit Diagnoses    Need for hepatitis C screening test       Relevant Orders   Hepatitis C antibody      A1c is much improved at 7.0, continue current medication.  Continue to monitor closely and check blood sugars and follow-up with Almyra Free in 1 month as well. Follow up plan: Return in about 3 months (around 12/31/2019), or if symptoms worsen or fail to improve, for Diabetes and hypertension.  Counseling provided for all of the vaccine components Orders Placed This Encounter  Procedures  . Bayer DCA Hb A1c Waived  . Hepatitis C antibody  . CBC with Differential/Platelet  . CMP14+EGFR  . Lipid panel    Caryl Pina, MD [facility] 10/01/2019, 9:08 AM

## 2019-10-02 LAB — CBC WITH DIFFERENTIAL/PLATELET
Basophils Absolute: 0.1 10*3/uL (ref 0.0–0.2)
Basos: 1 %
EOS (ABSOLUTE): 0.2 10*3/uL (ref 0.0–0.4)
Eos: 2 %
Hematocrit: 40.7 % (ref 37.5–51.0)
Hemoglobin: 13.1 g/dL (ref 13.0–17.7)
Immature Grans (Abs): 0 10*3/uL (ref 0.0–0.1)
Immature Granulocytes: 0 %
Lymphocytes Absolute: 1.9 10*3/uL (ref 0.7–3.1)
Lymphs: 28 %
MCH: 27.7 pg (ref 26.6–33.0)
MCHC: 32.2 g/dL (ref 31.5–35.7)
MCV: 86 fL (ref 79–97)
Monocytes Absolute: 0.6 10*3/uL (ref 0.1–0.9)
Monocytes: 8 %
Neutrophils Absolute: 4.1 10*3/uL (ref 1.4–7.0)
Neutrophils: 61 %
Platelets: 291 10*3/uL (ref 150–450)
RBC: 4.73 x10E6/uL (ref 4.14–5.80)
RDW: 14.7 % (ref 11.6–15.4)
WBC: 6.8 10*3/uL (ref 3.4–10.8)

## 2019-10-02 LAB — LIPID PANEL
Chol/HDL Ratio: 4 ratio (ref 0.0–5.0)
Cholesterol, Total: 131 mg/dL (ref 100–199)
HDL: 33 mg/dL — ABNORMAL LOW (ref >39)
LDL Chol Calc (NIH): 61 mg/dL (ref 0–99)
Triglycerides: 226 mg/dL — ABNORMAL HIGH (ref 0–149)
VLDL Cholesterol Cal: 37 mg/dL (ref 5–40)

## 2019-10-02 LAB — CMP14+EGFR
ALT: 19 IU/L (ref 0–44)
AST: 14 IU/L (ref 0–40)
Albumin/Globulin Ratio: 1.3 (ref 1.2–2.2)
Albumin: 3.8 g/dL (ref 3.7–4.7)
Alkaline Phosphatase: 83 IU/L (ref 44–121)
BUN/Creatinine Ratio: 16 (ref 10–24)
BUN: 16 mg/dL (ref 8–27)
Bilirubin Total: 0.3 mg/dL (ref 0.0–1.2)
CO2: 22 mmol/L (ref 20–29)
Calcium: 9 mg/dL (ref 8.6–10.2)
Chloride: 100 mmol/L (ref 96–106)
Creatinine, Ser: 1 mg/dL (ref 0.76–1.27)
GFR calc Af Amer: 82 mL/min/{1.73_m2} (ref >59)
GFR calc non Af Amer: 71 mL/min/{1.73_m2} (ref >59)
Globulin, Total: 3 g/dL (ref 1.5–4.5)
Glucose: 209 mg/dL — ABNORMAL HIGH (ref 65–99)
Potassium: 4.4 mmol/L (ref 3.5–5.2)
Sodium: 138 mmol/L (ref 134–144)
Total Protein: 6.8 g/dL (ref 6.0–8.5)

## 2019-10-03 LAB — HEPATITIS C ANTIBODY: Hep C Virus Ab: <0.1 s/co ratio (ref 0.0–0.9)

## 2019-10-03 LAB — SPECIMEN STATUS REPORT

## 2019-10-18 DIAGNOSIS — Z794 Long term (current) use of insulin: Secondary | ICD-10-CM | POA: Diagnosis not present

## 2019-10-18 DIAGNOSIS — E119 Type 2 diabetes mellitus without complications: Secondary | ICD-10-CM | POA: Diagnosis not present

## 2019-10-24 ENCOUNTER — Other Ambulatory Visit: Payer: Self-pay | Admitting: Cardiology

## 2019-10-24 ENCOUNTER — Other Ambulatory Visit: Payer: Self-pay | Admitting: Family Medicine

## 2019-10-25 ENCOUNTER — Telehealth: Payer: Self-pay

## 2019-10-29 DIAGNOSIS — G4733 Obstructive sleep apnea (adult) (pediatric): Secondary | ICD-10-CM | POA: Diagnosis not present

## 2019-10-29 NOTE — Telephone Encounter (Signed)
reviewed sliding scale, lantus Patient needed dexcom support; he called dexcom cares for support; needs addressed Discussed adding SGLT2 (farxiga) at next PCP appt Most BGs are staying <180 Continue current management for now

## 2019-10-31 ENCOUNTER — Other Ambulatory Visit: Payer: Self-pay | Admitting: Cardiology

## 2019-10-31 ENCOUNTER — Ambulatory Visit (INDEPENDENT_AMBULATORY_CARE_PROVIDER_SITE_OTHER): Payer: Medicare Other

## 2019-10-31 DIAGNOSIS — I359 Nonrheumatic aortic valve disorder, unspecified: Secondary | ICD-10-CM | POA: Diagnosis not present

## 2019-10-31 LAB — ECHOCARDIOGRAM COMPLETE
AR max vel: 1.01 cm2
AV Area VTI: 0.98 cm2
AV Area mean vel: 1.03 cm2
AV Mean grad: 23.2 mmHg
AV Peak grad: 41.2 mmHg
Ao pk vel: 3.21 m/s
Area-P 1/2: 3.63 cm2
Calc EF: 59.9 %
MV M vel: 1.59 m/s
MV Peak grad: 10.1 mmHg
S' Lateral: 3.11 cm
Single Plane A2C EF: 60.6 %
Single Plane A4C EF: 58.3 %

## 2019-11-01 ENCOUNTER — Ambulatory Visit: Payer: Medicare Other | Admitting: Pharmacist

## 2019-11-02 ENCOUNTER — Encounter: Payer: Self-pay | Admitting: Cardiology

## 2019-11-02 ENCOUNTER — Other Ambulatory Visit: Payer: Self-pay

## 2019-11-02 ENCOUNTER — Ambulatory Visit (INDEPENDENT_AMBULATORY_CARE_PROVIDER_SITE_OTHER): Payer: Medicare Other | Admitting: Cardiology

## 2019-11-02 VITALS — BP 154/64 | HR 86 | Ht 66.0 in | Wt 239.0 lb

## 2019-11-02 DIAGNOSIS — I25119 Atherosclerotic heart disease of native coronary artery with unspecified angina pectoris: Secondary | ICD-10-CM

## 2019-11-02 DIAGNOSIS — I350 Nonrheumatic aortic (valve) stenosis: Secondary | ICD-10-CM | POA: Diagnosis not present

## 2019-11-02 DIAGNOSIS — E782 Mixed hyperlipidemia: Secondary | ICD-10-CM

## 2019-11-02 NOTE — Patient Instructions (Signed)
Medication Instructions:  Your physician recommends that you continue on your current medications as directed. Please refer to the Current Medication list given to you today.  *If you need a refill on your cardiac medications before your next appointment, please call your pharmacy*   Lab Work: None today If you have labs (blood work) drawn today and your tests are completely normal, you will receive your results only by: Marland Kitchen MyChart Message (if you have MyChart) OR . A paper copy in the mail If you have any lab test that is abnormal or we need to change your treatment, we will call you to review the results.   Testing/Procedures: None today   Follow-Up: At [facility], you and your health needs are our priority.  As part of our continuing mission to provide you with exceptional heart care, we have created designated Provider Care Teams.  These Care Teams include your primary Cardiologist (physician) and Advanced Practice Providers (APPs -  Physician Assistants and Nurse Practitioners) who all work together to provide you with the care you need, when you need it.  We recommend signing up for the patient portal called "MyChart".  Sign up information is provided on this After Visit Summary.  MyChart is used to connect with patients for Virtual Visits (Telemedicine).  Patients are able to view lab/test results, encounter notes, upcoming appointments, etc.  Non-urgent messages can be sent to your provider as well.   To learn more about what you can do with MyChart, go to [email or web address].    Your next appointment:   3 month(s)  The format for your next appointment:   In Person  Provider:   Rozann Lesches, MD   Other Instructions You have been referred to [facility].They will call you to set up appointment.      Thank you for choosing [facility] !

## 2019-11-02 NOTE — Progress Notes (Signed)
Cardiology Office Note  Date: 11/02/2019   ID: [name], DOB 13-Mar-1940, MRN [id number]  PCP:  Dettinger, Fransisca Kaufmann, MD  Cardiologist:  Rozann Lesches, MD Electrophysiologist:  None   Chief Complaint  Patient presents with  . Cardiac follow-up    History of Present Illness: [name] is a 79 y.o. male last seen in April.  He presents for a follow-up visit.  He describes worsening dyspnea on exertion and fatigue, no definitive angina.  He is able to walk about 50 yards before becoming significantly short of breath and having to rest.  He has had no dizziness or syncope.  He did have COVID-19 earlier in the year, but relatively mild symptoms, was not hospitalized.  No recent cough or wheezing.  I reviewed his recent follow-up echocardiogram which shows LVEF 60 to 65%, normal RV contraction, and progression in aortic stenosis into the moderate to severe range.  Mean gradient now 23 mmHg with dimensionless index 0.28.  This has changed significantly since his last study.  I reviewed his medications which are stable and outlined below.  Past Medical History:  Diagnosis Date  . Anxiety   . Aortic atherosclerosis ([place]) 12/19/2017  . Arthritis   . Bursitis of hip   . Cervical spondylosis   . Chronic headaches   . Coronary atherosclerosis of native coronary artery    BMS nondominant RCA 12/2004  . Essential hypertension   . GERD (gastroesophageal reflux disease)   . History of adenomatous polyp of colon   . History of kidney stones   . History of MI (myocardial infarction) 12/2004  . History of viral meningitis 02/28/2018   December 2019  . Hyperlipidemia   . Internal hemorrhoids   . Low back pain   . Nocturia more than twice per night   . OSA (obstructive sleep apnea)   . Spinal stenosis   . Type 2 diabetes mellitus treated with insulin ([place])   . Wears dentures    Upper    Past Surgical History:  Procedure Laterality Date  . CARDIAC CATHETERIZATION   06-14-2004  dr Lia Foyer   total occlusion mD1, RI 30-40%, mRCA nondominant hazy 80% and scattered 30-40%,  ef 71% (medical mangement)  . CARDIOVASCULAR STRESS TEST  09-17-2015   dr Domenic Polite   Low risk nuclear study w/ reversible mild small anteroapical defect,  normal LV function and wall motion, nuclear stress ef 61%  . CATARACT EXTRACTION W/ INTRAOCULAR LENS IMPLANT Right 07/2014  . CHOLECYSTECTOMY    . COLONOSCOPY  09/10/2008   normal  . CORONARY ANGIOPLASTY WITH STENT PLACEMENT  12-17-2004   dr benismhon/ dr Olevia Perches   nonobstructive cad LAD and LCFx,  BMS x1 to total occlusion RCA   . CYSTOSCOPY W/ URETEROSCOPY W/ LITHOTRIPSY  08/2010  . ERCP  03/14/2008  . EYE SURGERY Bilateral    cataracts  . LAMINECTOMY    . LAPAROSCOPIC CHOLECYSTECTOMY  05/2012  . LEFT HEART CATHETERIZATION WITH CORONARY ANGIOGRAM N/A 05/14/2011   Procedure: LEFT HEART CATHETERIZATION WITH CORONARY ANGIOGRAM;  Surgeon: Sherren Mocha, MD;  Location: [facility] CATH LAB;  Service: Cardiovascular;  Laterality: N/A;    non-obstructive LM, LAD, LCFx, patent RCA stent  . LUMBAR LAMINECTOMY/DECOMPRESSION MICRODISCECTOMY N/A 02/24/2017   Procedure: Microlumbar decompression L4-5, L5-S1;  Surgeon: Susa Day, MD;  Location: [facility] ORS;  Service: Orthopedics;  Laterality: N/A;  120 mins  . ROTATOR CUFF REPAIR Right 03/2002  . ROTATOR CUFF REPAIR Left 12/11/2015  . TOTAL HIP ARTHROPLASTY  Right 12/20/2008  . TOTAL HIP ARTHROPLASTY Left 07/05/2018   Procedure: TOTAL HIP ARTHROPLASTY ANTERIOR APPROACH;  Surgeon: Gaynelle Arabian, MD;  Location: [facility] ORS;  Service: Orthopedics;  Laterality: Left;  141min  . TOTAL KNEE ARTHROPLASTY Bilateral left 12-11-2003/  right 07-31-2004   dr Wynelle [facility]  . UPPER GASTROINTESTINAL ENDOSCOPY  01/08/2008   bx, inlet patch, duodenitis  . YAG LASER APPLICATION Right 9/67/5916   Procedure: YAG LASER APPLICATION;  Surgeon: Williams Che, MD;  Location: [facility] ORS;  Service: Ophthalmology;  Laterality: Right;     Current Outpatient Medications  Medication Sig Dispense Refill  . acetaminophen (TYLENOL) 650 MG CR tablet Take 1,300 mg by mouth every 8 (eight) hours as needed for pain.     . ACETAMINOPHEN-BUTALBITAL 50-325 MG TABS Take by mouth 2 (two) times daily as needed.    Marland Kitchen amLODipine (NORVASC) 5 MG tablet TAKE 1 TABLET BY MOUTH  DAILY 90 tablet 0  . aspirin EC 81 MG tablet Take 81 mg by mouth daily.    Marland Kitchen atorvastatin (LIPITOR) 20 MG tablet TAKE 1 TABLET BY MOUTH IN  THE EVENING 90 tablet 0  . cetirizine (ZYRTEC) 10 MG tablet Take 10 mg by mouth every evening.     . Cholecalciferol (VITAMIN D3) 50 MCG (2000 UT) capsule Take 2,000 Units by mouth daily.    . Continuous Blood Gluc Receiver (DEXCOM G6 RECEIVER) DEVI USE TO CHECK BLOOD SUGAR UP TO 4 TIMES DAILY AS DIRECTED. 1 DEVICE PER YEAR. DX: E11.9 1 each 1  . Continuous Blood Gluc Sensor (DEXCOM G6 SENSOR) MISC USE TO CHECK BLOOD SUGAR UP TO 4 TIMES DAILY AS DIRECTED. CHANGE SENSOR EVERY 30 DAYS. DX: E11.9 3 each 4  . Continuous Blood Gluc Transmit (DEXCOM G6 TRANSMITTER) MISC USE TO CHECK BLOOD SUGAR UP TO 4 TIMES DAILY AS DIRECTED. CHANGE TRANSMITTER EVERY 3 MONTHS. DX: E11.9 1 each 12  . docusate sodium (COLACE) 100 MG capsule Take 100 mg by mouth 2 (two) times daily.    . famotidine (PEPCID) 20 MG tablet TAKE 1 TABLET BY MOUTH  TWICE DAILY WITH MEALS 180 tablet 0  . glucose blood test strip Test 4 times daily Dx E11.59 400 each 3  . HUMALOG KWIKPEN 100 UNIT/ML KwikPen INJECT 0 TO 15 UNITS  SUBCUTANEOUSLY 3 TIMES  DAILY BEFORE MEALS ;  70-150=8 UNITS, 151-200= 9  UNITS. 45 mL 0  . isosorbide mononitrate (IMDUR) 30 MG 24 hr tablet TAKE 1 TABLET BY MOUTH IN  THE EVENING 90 tablet 1  . LANTUS SOLOSTAR 100 UNIT/ML Solostar Pen INJECT SUBCUTANEOUSLY 52  UNITS AT BEDTIME 60 mL 3  . losartan (COZAAR) 50 MG tablet TAKE 1 TABLET BY MOUTH  DAILY 90 tablet 3  . metFORMIN (GLUCOPHAGE-XR) 500 MG 24 hr tablet TAKE 2 TABLETS BY MOUTH  TWICE DAILY 360 tablet 0   . nitroGLYCERIN (NITROSTAT) 0.4 MG SL tablet DISSOLVE ONE TABLET UNDER THE TONGUE EVERY 5 MINUTES AS NEEDED FOR CHEST PAIN.  DO NOT EXCEED A TOTAL OF 3 DOSES IN 15 MINUTES 25 tablet 3  . Omega-3 Fatty Acids (FISH OIL) 1000 MG CAPS Take by mouth.    Marland Kitchen PARoxetine (PAXIL) 20 MG tablet TAKE 1 TABLET BY MOUTH  DAILY 90 tablet 0  . polyethylene glycol powder (GLYCOLAX/MIRALAX) 17 GM/SCOOP powder Take 17 g by mouth as needed.    . tamsulosin (FLOMAX) 0.4 MG CAPS capsule TAKE 1 CAPSULE BY MOUTH  DAILY 90 capsule 3  . Turmeric 500 MG CAPS  Take by mouth daily.    . vitamin B-12 (CYANOCOBALAMIN) 1000 MCG tablet Take 2,000 mcg by mouth daily.     No current facility-administered medications for this visit.   Allergies:  Ace inhibitors, Angiotensin receptor blockers, Ciprofloxacin, Oxycodone-acetaminophen, Robaxin [methocarbamol], Toradol [ketorolac tromethamine], Valium, and Doxycycline   ROS:  No syncope.  Physical Exam: VS:  BP (!) 154/64   Pulse 86   Ht 5\' 6"  (1.676 m)   Wt 239 lb (108.4 kg)   SpO2 95%   BMI 38.58 kg/m , BMI Body mass index is 38.58 kg/m.  Wt Readings from Last 3 Encounters:  11/02/19 239 lb (108.4 kg)  10/01/19 238 lb (108 kg)  08/15/19 230 lb (104.3 kg)    General: Elderly male, appears comfortable at rest. HEENT: Conjunctiva and lids normal, wearing a mask. Neck: Supple, no elevated JVP or carotid bruits, no thyromegaly. Lungs: Clear to auscultation, nonlabored breathing at rest. Cardiac: Regular rate and rhythm, no S3, 3-7/4 basal systolic murmur, no pericardial rub. Abdomen: Protuberant, nontender, bowel sounds present. Extremities: Mild ankle edema, distal pulses 2+. Skin: Warm and dry. Musculoskeletal: No kyphosis. Neuropsychiatric: Alert and oriented x3, affect grossly appropriate.  ECG:  An ECG dated 05/09/2019 was personally reviewed today and demonstrated:  Sinus rhythm with nonspecific T wave changes.  Recent Labwork: 10/01/2019: ALT 19; AST 14; BUN 16;  Creatinine, Ser 1.00; Hemoglobin 13.1; Platelets 291; Potassium 4.4; Sodium 138     Component Value Date/Time   CHOL 131 10/01/2019 0938   CHOL 139 06/02/2012 1007   TRIG 226 (H) 10/01/2019 0938   TRIG 185 (H) 04/10/2013 0937   TRIG 107 06/02/2012 1007   HDL 33 (L) 10/01/2019 0938   HDL 34 (L) 04/10/2013 0937   HDL 40 06/02/2012 1007   CHOLHDL 4.0 10/01/2019 0938   CHOLHDL 3.8 12/15/2017 0552   VLDL 30 12/15/2017 0552   LDLCALC 61 10/01/2019 0938   LDLCALC 62 04/10/2013 0937   LDLCALC 78 06/02/2012 1007    Other Studies Reviewed Today:  Echocardiogram 10/31/2019: 1. Left ventricular ejection fraction, by estimation, is 60 to 65%. The  left ventricle has normal function. The left ventricle has no regional  wall motion abnormalities. There is mild left ventricular hypertrophy.  Left ventricular diastolic parameters  are indeterminate.  2. Right ventricular systolic function is normal. The right ventricular  size is normal. Tricuspid regurgitation signal is inadequate for assessing  PA pressure.  3. The mitral valve is grossly normal with mild annular calcification.  Trivial mitral valve regurgitation.  4. The aortic valve is tricuspid. There is moderate calcification of the  aortic valve. Aortic valve regurgitation is not visualized. Moderate to  severe aortic valve stenosis. Aortic valve mean gradient measures 23.2  mmHg. Aortic valve Vmax measures 3.21  m/s. Dimentionless index 0.28.  5. Aortic dilatation noted. There is borderline dilatation of the  ascending aorta, measuring 40 mm.  6. Unable to estimate CVP.   Assessment and Plan:  1.  Progressive dyspnea on exertion as discussed above in a 79 year old male with calcific aortic stenosis, recently found to be progressive into the moderate to severe range with mean gradient 23 mmHg and dimensionless index 0.28.  He also has underlying CAD status post previous BMS to the RCA in 2006, obesity, type 2 diabetes  mellitus, and hyperlipidemia.  Today I talked with him about referral to our valve clinic for further assessment.  Anticipate right and left heart catheterization to clarify both coronary anatomy and degree of  valvular stenosis from an invasive perspective, then determine best treatment strategy.  It seems unlikely that his mild COVID-19 symptoms earlier in the year are still contributing to his breathlessness now.  LVEF is normal at 60 to 65% with normal RV contraction as well.  2.  Calcific aortic stenosis, mean gradient 23 mmHg and dimensionless index 0.28, progressed since last assessment.  3.  CAD status post BMS to the RCA in 2006.  He could also have progressive coronary artery disease, not describing definite angina or nitroglycerin use recently.  Continue aspirin, Norvasc, Lipitor, Imdur, losartan, and as needed nitroglycerin.  4.  Mixed hyperlipidemia, he continues on Lipitor with recent LDL 61.  Medication Adjustments/Labs and Tests Ordered: Current medicines are reviewed at length with the patient today.  Concerns regarding medicines are outlined above.   Tests Ordered: No orders of the defined types were placed in this encounter.   Medication Changes: No orders of the defined types were placed in this encounter.   Disposition:  Follow up consultation of the valve clinic.  Signed, Satira Sark, MD, [facility] 11/02/2019 10:36 AM    [place] [facility] [place]. [address], [place], [place] [address] Phone: [phone]; Fax: [phone]

## 2019-11-05 ENCOUNTER — Telehealth: Payer: Self-pay | Admitting: *Deleted

## 2019-11-05 NOTE — Telephone Encounter (Signed)
Pt voiced understanding and is awaiting to be scheduled with valve clinic per LOV on Friday

## 2019-11-05 NOTE — Telephone Encounter (Signed)
-----   Message from Satira Sark, MD sent at 10/31/2019  3:31 PM EDT ----- Results reviewed.  LVEF remains normal, but aortic stenosis has progressed into the moderate to severe range compared to his last study.  Keep scheduled follow-up to discuss symptoms.

## 2019-11-07 ENCOUNTER — Telehealth: Payer: Self-pay

## 2019-11-07 ENCOUNTER — Encounter: Payer: Self-pay | Admitting: Cardiovascular Disease

## 2019-11-07 ENCOUNTER — Encounter: Payer: Self-pay | Admitting: *Deleted

## 2019-11-07 ENCOUNTER — Other Ambulatory Visit: Payer: Self-pay

## 2019-11-07 ENCOUNTER — Ambulatory Visit: Payer: Medicare Other | Admitting: Cardiovascular Disease

## 2019-11-07 VITALS — BP 162/68 | HR 62 | Ht 66.0 in | Wt 243.0 lb

## 2019-11-07 DIAGNOSIS — I350 Nonrheumatic aortic (valve) stenosis: Secondary | ICD-10-CM

## 2019-11-07 DIAGNOSIS — Z01812 Encounter for preprocedural laboratory examination: Secondary | ICD-10-CM | POA: Diagnosis not present

## 2019-11-07 LAB — CBC
Hematocrit: 41.8 % (ref 37.5–51.0)
Hemoglobin: 13.6 g/dL (ref 13.0–17.7)
MCH: 27.6 pg (ref 26.6–33.0)
MCHC: 32.5 g/dL (ref 31.5–35.7)
MCV: 85 fL (ref 79–97)
Platelets: 293 10*3/uL (ref 150–450)
RBC: 4.92 x10E6/uL (ref 4.14–5.80)
RDW: 15.9 % — ABNORMAL HIGH (ref 11.6–15.4)
WBC: 6.4 10*3/uL (ref 3.4–10.8)

## 2019-11-07 LAB — BASIC METABOLIC PANEL
BUN/Creatinine Ratio: 19 (ref 10–24)
BUN: 15 mg/dL (ref 8–27)
CO2: 25 mmol/L (ref 20–29)
Calcium: 9.3 mg/dL (ref 8.6–10.2)
Chloride: 102 mmol/L (ref 96–106)
Creatinine, Ser: 0.8 mg/dL (ref 0.76–1.27)
GFR calc Af Amer: 98 mL/min/{1.73_m2} (ref >59)
GFR calc non Af Amer: 85 mL/min/{1.73_m2} (ref >59)
Glucose: 214 mg/dL — ABNORMAL HIGH (ref 65–99)
Potassium: 3.9 mmol/L (ref 3.5–5.2)
Sodium: 138 mmol/L (ref 134–144)

## 2019-11-07 NOTE — Patient Instructions (Signed)
Medication Instructions:  No changes *If you need a refill on your cardiac medications before your next appointment, please call your pharmacy*   Lab Work: Today: bmet, cbc If you have labs (blood work) drawn today and your tests are completely normal, you will receive your results only by: Marland Kitchen MyChart Message (if you have MyChart) OR . A paper copy in the mail If you have any lab test that is abnormal or we need to change your treatment, we will call you to review the results.   Testing/Procedures: Your physician has requested that you have a cardiac catheterization. Cardiac catheterization is used to diagnose and/or treat various heart conditions. Doctors may recommend this procedure for a number of different reasons. The most common reason is to evaluate chest pain. Chest pain can be a symptom of coronary artery disease (CAD), and cardiac catheterization can show whether plaque is narrowing or blocking your heart's arteries. This procedure is also used to evaluate the valves, as well as measure the blood flow and oxygen levels in different parts of your heart. For further information please visit [email or web address]. Please follow instruction sheet, as given.   Follow-Up: Theodosia Quay, RN Structural Heart Nurse Navigator will contact you to arrange further testing.  Other Instructions

## 2019-11-07 NOTE — Telephone Encounter (Signed)
Pts wife called requesting to speak with Almyra Free. Says she spoke with the pharmacist ([facility] [place]) and says that they are still waiting on [place] to approve the Basaglar Insulin for pt to take.  Says pt got the Humalog but is still waiting on the [place] and needs advice on what to do.

## 2019-11-07 NOTE — Progress Notes (Signed)
[facility] Consult Note  Chief Complaint  Patient presents with  . New Patient (Initial Visit)    aortic stenosis   History of Present Illness:79 yo male with history of CAD, HTN, GERD, hyperlipidemia, sleep apnea, DM on insulin and aortic stenosis who is here today as a new consult, referred by Dr. Domenic Polite, for further discussion regarding his aortic stenosis. He is known to have CAD with last cardiac cath in 2006 at which time a bare metal stent was placed in the RCA. No cardiac cath since then. He has been followed for moderate aortic stenosis. Most recent echocardiogram 10/31/19 with LVEF=60-65%, mild LVH. The aortic valve leaflets are thickened and calcified with limited leaflet excursion. Mean gradient 23.2 mmHg, peak gradient 41.2 mmHg, AVA 0.98 cm2, dimensionless index 0.28. His echo parameters suggest moderate to severe AS. He was seen recently by Dr. Domenic Polite and described worsened dyspnea on exertion and progressive fatigue. He has had no chest pain.   He tells me today that he can walk 50 yards before becoming short of breath. He rests and then can walk again. He denies chest pain, dizziness, near syncope, syncope or lower extremity edema. He is retired. He worked at [employer] but has been retired for 24 years. He lives near [place], [place] with his wife. He has full dentures on the top and a bridge on the bottom. No active dental issues.   Primary Care Physician: Dettinger, Fransisca Kaufmann, MD Primary Cardiologist: Rozann Lesches Referring Cardiologist: Rozann Lesches  Past Medical History:  Diagnosis Date  . Anxiety   . Aortic atherosclerosis ([place]) 12/19/2017  . Arthritis   . Bursitis of hip   . Cervical spondylosis   . Chronic headaches   . Coronary atherosclerosis of native coronary artery    BMS nondominant RCA 12/2004  . Essential hypertension   . GERD (gastroesophageal reflux disease)   . History of adenomatous polyp of colon   . History of kidney stones   .  History of MI (myocardial infarction) 12/2004  . History of viral meningitis 02/28/2018   December 2019  . Hyperlipidemia   . Internal hemorrhoids   . Low back pain   . Nocturia more than twice per night   . OSA (obstructive sleep apnea)   . Spinal stenosis   . Type 2 diabetes mellitus treated with insulin ([place])   . Wears dentures    Upper    Past Surgical History:  Procedure Laterality Date  . CARDIAC CATHETERIZATION  06-14-2004  dr Lia Foyer   total occlusion mD1, RI 30-40%, mRCA nondominant hazy 80% and scattered 30-40%,  ef 71% (medical mangement)  . CARDIOVASCULAR STRESS TEST  09-17-2015   dr Domenic Polite   Low risk nuclear study w/ reversible mild small anteroapical defect,  normal LV function and wall motion, nuclear stress ef 61%  . CATARACT EXTRACTION W/ INTRAOCULAR LENS IMPLANT Right 07/2014  . COLONOSCOPY  09/10/2008   normal  . CORONARY ANGIOPLASTY WITH STENT PLACEMENT  12-17-2004   dr benismhon/ dr Olevia Perches   nonobstructive cad LAD and LCFx,  BMS x1 to total occlusion RCA   . CYSTOSCOPY W/ URETEROSCOPY W/ LITHOTRIPSY  08/2010  . ERCP  03/14/2008  . EYE SURGERY Bilateral    cataracts  . LAPAROSCOPIC CHOLECYSTECTOMY  05/2012  . LEFT HEART CATHETERIZATION WITH CORONARY ANGIOGRAM N/A 05/14/2011   Procedure: LEFT HEART CATHETERIZATION WITH CORONARY ANGIOGRAM;  Surgeon: Sherren Mocha, MD;  Location: [facility] CATH LAB;  Service: Cardiovascular;  Laterality: N/A;  non-obstructive LM, LAD, LCFx, patent RCA stent  . LUMBAR LAMINECTOMY/DECOMPRESSION MICRODISCECTOMY N/A 02/24/2017   Procedure: Microlumbar decompression L4-5, L5-S1;  Surgeon: Susa Day, MD;  Location: [facility] ORS;  Service: Orthopedics;  Laterality: N/A;  120 mins  . ROTATOR CUFF REPAIR Right 03/2002  . ROTATOR CUFF REPAIR Left 12/11/2015  . TOTAL HIP ARTHROPLASTY Right 12/20/2008  . TOTAL HIP ARTHROPLASTY Left 07/05/2018   Procedure: TOTAL HIP ARTHROPLASTY ANTERIOR APPROACH;  Surgeon: Gaynelle Arabian, MD;  Location: [facility] OR[facility];   Service: Orthopedics;  Laterality: Left;  126min  . TOTAL KNEE ARTHROPLASTY Bilateral left 12-11-2003/  right 07-31-2004   dr Wynelle Link [facility]  . UPPER GASTROINTESTINAL ENDOSCOPY  01/08/2008   bx, inlet patch, duodenitis  . YAG LASER APPLICATION Right 7/68/0881   Procedure: YAG LASER APPLICATION;  Surgeon: Williams Che, MD;  Location: [facility] ORS;  Service: Ophthalmology;  Laterality: Right;    Current Outpatient Medications  Medication Sig Dispense Refill  . acetaminophen (TYLENOL) 650 MG CR tablet Take 1,300 mg by mouth every 8 (eight) hours as needed for pain.     . ACETAMINOPHEN-BUTALBITAL 50-325 MG TABS Take by mouth 2 (two) times daily as needed.    Marland Kitchen amLODipine (NORVASC) 5 MG tablet TAKE 1 TABLET BY MOUTH  DAILY 90 tablet 0  . aspirin EC 81 MG tablet Take 81 mg by mouth daily.    Marland Kitchen atorvastatin (LIPITOR) 20 MG tablet TAKE 1 TABLET BY MOUTH IN  THE EVENING 90 tablet 0  . cetirizine (ZYRTEC) 10 MG tablet Take 10 mg by mouth every evening.     . Cholecalciferol (VITAMIN D3) 50 MCG (2000 UT) capsule Take 4,000 Units by mouth daily.     . Continuous Blood Gluc Receiver (DEXCOM G6 RECEIVER) DEVI USE TO CHECK BLOOD SUGAR UP TO 4 TIMES DAILY AS DIRECTED. 1 DEVICE PER YEAR. DX: E11.9 1 each 1  . Continuous Blood Gluc Sensor (DEXCOM G6 SENSOR) MISC USE TO CHECK BLOOD SUGAR UP TO 4 TIMES DAILY AS DIRECTED. CHANGE SENSOR EVERY 30 DAYS. DX: E11.9 3 each 4  . Continuous Blood Gluc Transmit (DEXCOM G6 TRANSMITTER) MISC USE TO CHECK BLOOD SUGAR UP TO 4 TIMES DAILY AS DIRECTED. CHANGE TRANSMITTER EVERY 3 MONTHS. DX: E11.9 1 each 12  . docusate sodium (COLACE) 100 MG capsule Take 100 mg by mouth 2 (two) times daily.    Marland Kitchen donepezil (ARICEPT) 5 MG tablet Take 5 mg by mouth at bedtime.    . famotidine (PEPCID) 20 MG tablet TAKE 1 TABLET BY MOUTH  TWICE DAILY WITH MEALS 180 tablet 0  . glucose blood test strip Test 4 times daily Dx E11.59 400 each 3  . HUMALOG KWIKPEN 100 UNIT/ML KwikPen INJECT 0 TO 15 UNITS   SUBCUTANEOUSLY 3 TIMES  DAILY BEFORE MEALS ;  70-150=8 UNITS, 151-200= 9  UNITS. 45 mL 0  . isosorbide mononitrate (IMDUR) 30 MG 24 hr tablet TAKE 1 TABLET BY MOUTH IN  THE EVENING 90 tablet 1  . LANTUS SOLOSTAR 100 UNIT/ML Solostar Pen INJECT SUBCUTANEOUSLY 52  UNITS AT BEDTIME 60 mL 3  . losartan (COZAAR) 50 MG tablet TAKE 1 TABLET BY MOUTH  DAILY 90 tablet 3  . metFORMIN (GLUCOPHAGE-XR) 500 MG 24 hr tablet TAKE 2 TABLETS BY MOUTH  TWICE DAILY 360 tablet 0  . nitroGLYCERIN (NITROSTAT) 0.4 MG SL tablet DISSOLVE ONE TABLET UNDER THE TONGUE EVERY 5 MINUTES AS NEEDED FOR CHEST PAIN.  DO NOT EXCEED A TOTAL OF 3 DOSES IN 15 MINUTES 25 tablet  3  . Omega-3 Fatty Acids (FISH OIL) 1000 MG CAPS Take by mouth.    Marland Kitchen PARoxetine (PAXIL) 20 MG tablet TAKE 1 TABLET BY MOUTH  DAILY 90 tablet 0  . polyethylene glycol powder (GLYCOLAX/MIRALAX) 17 GM/SCOOP powder Take 17 g by mouth as needed.    . tamsulosin (FLOMAX) 0.4 MG CAPS capsule TAKE 1 CAPSULE BY MOUTH  DAILY 90 capsule 3  . Turmeric 500 MG CAPS Take by mouth daily.    . vitamin B-12 (CYANOCOBALAMIN) 1000 MCG tablet Take 2,000 mcg by mouth daily.     No current facility-administered medications for this visit.    Allergies  Allergen Reactions  . Ace Inhibitors Hives, Swelling and Rash    Rash,hives,tongue swelling  . Angiotensin Receptor Blockers     Unknown reaction   . Ciprofloxacin Other (See Comments)    confusion  . Oxycodone-Acetaminophen Other (See Comments)    Unknown reaction  . Robaxin [Methocarbamol] Other (See Comments)    Confusion   . Toradol [Ketorolac Tromethamine] Other (See Comments)    confusion  . Valium Other (See Comments)    Hallucinations; confusion  . Doxycycline Hives and Rash    Social History   Socioeconomic History  . Marital status: Married    Spouse name: Dione   . Number of children: 1  . Years of education: HS  . Highest education level: High school graduate  Occupational History  . Occupation:  RETIRED    Employer: [employer]    Comment: Power plant  Tobacco Use  . Smoking status: Former Smoker    Packs/day: 1.00    Years: 20.00    Pack years: 20.00    Types: Cigarettes    Start date: 01/18/1963    Quit date: 08/11/1988    Years since quitting: 31.2  . Smokeless tobacco: Former Systems developer    Types: Chew    Quit date: 01/11/1989  . Tobacco comment: chewed 1 pack tobacco/day for 15 years  Vaping Use  . Vaping Use: Never used  Substance and Sexual Activity  . Alcohol use: No    Alcohol/week: 0.0 standard drinks  . Drug use: No  . Sexual activity: Yes  Other Topics Concern  . Not on file  Social History Narrative   No regular exercise      Daily caffeine use: 3 cups   Patient is right handed.    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 Difficulty of Paying Living Expenses: Not on file  Food Insecurity:   . Worried About Charity fundraiser in the Last Year: Not on file  . Ran Out of Food in the Last Year: Not on file  Transportation Needs:   . Lack of Transportation (Medical): Not on file  . Lack of Transportation (Non-Medical): Not on file  Physical Activity:   . Days of Exercise per Week: Not on file  . Minutes of Exercise per Session: Not on file  Stress:   . Feeling of Stress : Not on file  Social Connections:   . Frequency of Communication with Friends and Family: Not on file  . Frequency of Social Gatherings with Friends and Family: Not on file  . Attends Religious Services: Not on file  . Active Member of Clubs or Organizations: Not on file  . Attends Archivist Meetings:  Not on file  . Marital Status: Not on file  Intimate Partner Violence:   . Fear of Current or Ex-Partner: Not on file  . Emotionally Abused: Not on file  . Physically Abused: Not on file  . Sexually Abused: Not on file    Family History    Problem Relation Age of Onset  . Colon cancer Mother        Diagnosed age 18  . Cancer Mother 4  . Heart disease Father   . Heart attack Father   . Breast cancer Sister   . Heart disease Brother   . Brain cancer Sister   . Heart disease Brother   . Bladder Cancer Brother   . Cancer - Other Brother     Review of Systems:  As stated in the HPI and otherwise negative.   BP (!) 162/68   Pulse 62   Ht 5\' 6"  (1.676 m)   Wt 243 lb (110.2 kg)   SpO2 99%   BMI 39.22 kg/m   Physical Examination: General: Well developed, well nourished, NAD  HEENT: OP clear, mucus membranes moist  SKIN: warm, dry. No rashes. Neuro: No focal deficits  Musculoskeletal: Muscle strength 5/5 all ext  Psychiatric: Mood and affect normal  Neck: No JVD, no carotid bruits, no thyromegaly, no lymphadenopathy.  Lungs:Clear bilaterally, no wheezes, rhonci, crackles Cardiovascular: Regular rate and rhythm. Loud, harsh systolic murmur.Second heart sound present.   Abdomen:Soft. Bowel sounds present. Non-tender.  Extremities: No lower extremity edema. Pulses are 2 + in the bilateral DP/PT.  EKG:  EKG is ordered today. The ekg ordered today demonstrates sinus bradycardia  Echo 10/31/19:  1. Left ventricular ejection fraction, by estimation, is 60 to 65%. The  left ventricle has normal function. The left ventricle has no regional  wall motion abnormalities. There is mild left ventricular hypertrophy.  Left ventricular diastolic parameters  are indeterminate.  2. Right ventricular systolic function is normal. The right ventricular  size is normal. Tricuspid regurgitation signal is inadequate for assessing  PA pressure.  3. The mitral valve is grossly normal with mild annular calcification.  Trivial mitral valve regurgitation.  4. The aortic valve is tricuspid. There is moderate calcification of the  aortic valve. Aortic valve regurgitation is not visualized. Moderate to  severe aortic valve stenosis.  Aortic valve mean gradient measures 23.2  mmHg. Aortic valve Vmax measures 3.21  m/s. Dimentionless index 0.28.  5. Aortic dilatation noted. There is borderline dilatation of the  ascending aorta, measuring 40 mm.  6. Unable to estimate CVP.   FINDINGS  Left Ventricle: Left ventricular ejection fraction, by estimation, is 60  to 65%. The left ventricle has normal function. The left ventricle has no  regional wall motion abnormalities. The left ventricular internal cavity  size was normal in size. There is  mild left ventricular hypertrophy. Left ventricular diastolic parameters  are indeterminate.   Right Ventricle: The right ventricular size is normal. No increase in  right ventricular wall thickness. Right ventricular systolic function is  normal. Tricuspid regurgitation signal is inadequate for assessing PA  pressure.   Left Atrium: Left atrial size was normal in size.   Right Atrium: Right atrial size was normal in size.   Pericardium: There is no evidence of pericardial effusion.   Mitral Valve: The mitral valve is grossly normal. Mild mitral annular  calcification. Trivial mitral valve regurgitation.   Tricuspid Valve: The tricuspid valve is grossly normal. Tricuspid valve  regurgitation is mild.  Aortic Valve: The aortic valve is tricuspid. There is moderate  calcification of the aortic valve. There is moderate aortic valve annular  calcification. Aortic valve regurgitation is not visualized. Moderate to  severe aortic stenosis is present. Aortic  valve mean gradient measures 23.2 mmHg. Aortic valve peak gradient  measures 41.2 mmHg. Aortic valve area, by VTI measures 0.98 cm.   Pulmonic Valve: The pulmonic valve was grossly normal. Pulmonic valve  regurgitation is trivial.   Aorta: Aortic dilatation noted. There is borderline dilatation of the  ascending aorta, measuring 40 mm.   Venous: Unable to estimate CVP. IVC assessment for right atrial pressure    unable to be performed due to mechanical ventilation.   IAS/Shunts: The interatrial septum was not well visualized.     LEFT VENTRICLE  PLAX 2D  LVIDd:     5.37 cm   Diastology  LVIDs:     3.11 cm   LV e' medial:  6.87 cm/s  LV PW:     1.20 cm   LV E/e' medial: 10.9  LV IVS:    1.16 cm   LV e' lateral:  5.71 cm/s  LVOT diam:   2.10 cm   LV E/e' lateral: 13.2  LV SV:     56  LV SV Index:  26  LVOT Area:   3.46 cm    LV Volumes (MOD)  LV vol d, MOD A2C: 93.9 ml  LV vol d, MOD A4C: 120.0 ml  LV vol s, MOD A2C: 37.0 ml  LV vol s, MOD A4C: 50.0 ml  LV SV MOD A2C:   56.9 ml  LV SV MOD A4C:   120.0 ml  LV SV MOD BP:   64.7 ml   RIGHT VENTRICLE  RV S prime:   17.40 cm/s  TAPSE (M-mode): 2.3 cm   LEFT ATRIUM       Index  LA diam:    3.00 cm 1.39 cm/m  LA Vol (A2C):  41.9 ml 19.46 ml/m  LA Vol (A4C):  60.9 ml 28.28 ml/m  LA Biplane Vol:     23.90 ml/m  AORTIC VALVE  AV Area (Vmax):  1.01 cm  AV Area (Vmean):  1.03 cm  AV Area (VTI):   0.98 cm  AV Vmax:      320.80 cm/s  AV Vmean:     221.600 cm/s  AV VTI:      0.578 m  AV Peak Grad:   41.2 mmHg  AV Mean Grad:   23.2 mmHg  LVOT Vmax:     93.80 cm/s  LVOT Vmean:    66.200 cm/s  LVOT VTI:     0.163 m  LVOT/AV VTI ratio: 0.28    AORTA  Ao Root diam: 3.70 cm  Ao Asc diam: 4.00 cm   MITRAL VALVE        TRICUSPID VALVE  MV Area (PHT):       TR Peak grad:  7.0 mmHg  MV Decel Time: 209 msec  TR Vmax:    132.00 cm/s  MR Peak grad: 10.1 mmHg  MR Vmax:   159.00 cm/s SHUNTS  MV E velocity: 75.20 cm/s Systemic VTI: 0.16 m  MV A velocity: 85.90 cm/s Systemic Diam: 2.10 cm  MV E/A ratio: 0.88   Recent Labs: 10/01/2019: ALT 19; BUN 16; Creatinine, Ser 1.00; Hemoglobin 13.1; Platelets 291; Potassium 4.4; Sodium 138   Lipid Panel    Component Value Date/Time   CHOL 131 10/01/2019  0938  CHOL 139 06/02/2012 1007   TRIG 226 (H) 10/01/2019 0938   TRIG 185 (H) 04/10/2013 0937   TRIG 107 06/02/2012 1007   HDL 33 (L) 10/01/2019 0938   HDL 34 (L) 04/10/2013 0937   HDL 40 06/02/2012 1007   CHOLHDL 4.0 10/01/2019 0938   CHOLHDL 3.8 12/15/2017 0552   VLDL 30 12/15/2017 0552   LDLCALC 61 10/01/2019 0938   LDLCALC 62 04/10/2013 0937   LDLCALC 78 06/02/2012 1007     Wt Readings from Last 3 Encounters:  11/07/19 243 lb (110.2 kg)  11/02/19 239 lb (108.4 kg)  10/01/19 238 lb (108 kg)     Other studies Reviewed: Additional studies/ records that were reviewed today include: echo images, office notes Review of the above records demonstrates: moderate to severe AS   Assessment and Plan:   1. Severe Aortic Valve Stenosis: He has moderate to severe aortic stenosis.  I looked at his echo images personally and his valve appears to be moderate to severe. The data we have from the echo also suggests the aortic stenosis is moderate to severe. His symptoms would suggest that the valve is more severe. I think it will be important to exclude other things that could cause him to be so dyspneic. He is known to have CAD. A cardiac cath will be arranged to exclude obstructive CAD and also to better assess his aortic valve. If his CAD is stable, we will likely proceed with a gated cardiac CT to get more information about his aortic valve also. Given advanced age, he is not a good candidate for conventional AVR by surgical approach. I think he may be a good candidate for TAVR.   STS Risk Score: Risk of Mortality: 3.045% Renal Failure: 5.488% Permanent Stroke: 1.376% Prolonged Ventilation: 11.545% DSW Infection: 0.364% Reoperation: 3.690% Morbidity or Mortality: 18.459% Short Length of Stay: 24.484% Long Length of Stay: 9.952%   I have reviewed the natural history of aortic stenosis with the patient and their family members  who are present today. We have discussed the  limitations of medical therapy and the poor prognosis associated with symptomatic aortic stenosis. We have reviewed potential treatment options, including palliative medical therapy, conventional surgical aortic valve replacement, and transcatheter aortic valve replacement. We discussed treatment options in the context of the patient's specific comorbid medical conditions.   He would like to proceed with planning for further assessment of his aortic stenosis, possible TAVR. I will arrange a right and left heart catheterization at [facility] 11/14/19 at 10am. Risks and benefits of the cath procedure and the valve procedure are reviewed with the patient. After the cath, we will decide if we should proceed with TAVR workup or serial echo imaging.      Current medicines are reviewed at length with the patient today.  The patient does not have concerns regarding medicines.  The following changes have been made:  no change  Labs/ tests ordered today include:   Orders Placed This Encounter  Procedures  . CBC  . Basic metabolic panel   Disposition:   F/U with the valve team.    Signed, [name], MD 11/07/2019 10:23 AM    [address], [place], [place]  [address] Phone: [phone]; Fax: [phone]

## 2019-11-07 NOTE — Telephone Encounter (Signed)
Patient aware and verbalizes understanding - states he will call back for number since he is driving right now.

## 2019-11-07 NOTE — H&P (View-Only) (Signed)
[facility] Consult Note  Chief Complaint  Patient presents with  . New Patient (Initial Visit)    aortic stenosis   History of Present Illness:79 yo male with history of CAD, HTN, GERD, hyperlipidemia, sleep apnea, DM on insulin and aortic stenosis who is here today as a new consult, referred by Dr. Domenic Polite, for further discussion regarding his aortic stenosis. He is known to have CAD with last cardiac cath in 2006 at which time a bare metal stent was placed in the RCA. No cardiac cath since then. He has been followed for moderate aortic stenosis. Most recent echocardiogram 10/31/19 with LVEF=60-65%, mild LVH. The aortic valve leaflets are thickened and calcified with limited leaflet excursion. Mean gradient 23.2 mmHg, peak gradient 41.2 mmHg, AVA 0.98 cm2, dimensionless index 0.28. His echo parameters suggest moderate to severe AS. He was seen recently by Dr. Domenic Polite and described worsened dyspnea on exertion and progressive fatigue. He has had no chest pain.   He tells me today that he can walk 50 yards before becoming short of breath. He rests and then can walk again. He denies chest pain, dizziness, near syncope, syncope or lower extremity edema. He is retired. He worked at [employer] but has been retired for 24 years. He lives near [place], [place] with his wife. He has full dentures on the top and a bridge on the bottom. No active dental issues.   Primary Care Physician: Dettinger, Fransisca Kaufmann, MD Primary Cardiologist: Rozann Lesches Referring Cardiologist: Rozann Lesches  Past Medical History:  Diagnosis Date  . Anxiety   . Aortic atherosclerosis ([place]) 12/19/2017  . Arthritis   . Bursitis of hip   . Cervical spondylosis   . Chronic headaches   . Coronary atherosclerosis of native coronary artery    BMS nondominant RCA 12/2004  . Essential hypertension   . GERD (gastroesophageal reflux disease)   . History of adenomatous polyp of colon   . History of kidney stones   .  History of MI (myocardial infarction) 12/2004  . History of viral meningitis 02/28/2018   December 2019  . Hyperlipidemia   . Internal hemorrhoids   . Low back pain   . Nocturia more than twice per night   . OSA (obstructive sleep apnea)   . Spinal stenosis   . Type 2 diabetes mellitus treated with insulin ([place])   . Wears dentures    Upper    Past Surgical History:  Procedure Laterality Date  . CARDIAC CATHETERIZATION  06-14-2004  dr Lia Foyer   total occlusion mD1, RI 30-40%, mRCA nondominant hazy 80% and scattered 30-40%,  ef 71% (medical mangement)  . CARDIOVASCULAR STRESS TEST  09-17-2015   dr Domenic Polite   Low risk nuclear study w/ reversible mild small anteroapical defect,  normal LV function and wall motion, nuclear stress ef 61%  . CATARACT EXTRACTION W/ INTRAOCULAR LENS IMPLANT Right 07/2014  . COLONOSCOPY  09/10/2008   normal  . CORONARY ANGIOPLASTY WITH STENT PLACEMENT  12-17-2004   dr benismhon/ dr Olevia Perches   nonobstructive cad LAD and LCFx,  BMS x1 to total occlusion RCA   . CYSTOSCOPY W/ URETEROSCOPY W/ LITHOTRIPSY  08/2010  . ERCP  03/14/2008  . EYE SURGERY Bilateral    cataracts  . LAPAROSCOPIC CHOLECYSTECTOMY  05/2012  . LEFT HEART CATHETERIZATION WITH CORONARY ANGIOGRAM N/A 05/14/2011   Procedure: LEFT HEART CATHETERIZATION WITH CORONARY ANGIOGRAM;  Surgeon: Sherren Mocha, MD;  Location: [facility] CATH LAB;  Service: Cardiovascular;  Laterality: N/A;  non-obstructive LM, LAD, LCFx, patent RCA stent  . LUMBAR LAMINECTOMY/DECOMPRESSION MICRODISCECTOMY N/A 02/24/2017   Procedure: Microlumbar decompression L4-5, L5-S1;  Surgeon: Susa Day, MD;  Location: [facility] ORS;  Service: Orthopedics;  Laterality: N/A;  120 mins  . ROTATOR CUFF REPAIR Right 03/2002  . ROTATOR CUFF REPAIR Left 12/11/2015  . TOTAL HIP ARTHROPLASTY Right 12/20/2008  . TOTAL HIP ARTHROPLASTY Left 07/05/2018   Procedure: TOTAL HIP ARTHROPLASTY ANTERIOR APPROACH;  Surgeon: Gaynelle Arabian, MD;  Location: [facility] ORS;   Service: Orthopedics;  Laterality: Left;  112min  . TOTAL KNEE ARTHROPLASTY Bilateral left 12-11-2003/  right 07-31-2004   dr Wynelle Link [facility]  . UPPER GASTROINTESTINAL ENDOSCOPY  01/08/2008   bx, inlet patch, duodenitis  . YAG LASER APPLICATION Right 08/29/2991   Procedure: YAG LASER APPLICATION;  Surgeon: Williams Che, MD;  Location: [facility] ORS;  Service: Ophthalmology;  Laterality: Right;    Current Outpatient Medications  Medication Sig Dispense Refill  . acetaminophen (TYLENOL) 650 MG CR tablet Take 1,300 mg by mouth every 8 (eight) hours as needed for pain.     . ACETAMINOPHEN-BUTALBITAL 50-325 MG TABS Take by mouth 2 (two) times daily as needed.    Marland Kitchen amLODipine (NORVASC) 5 MG tablet TAKE 1 TABLET BY MOUTH  DAILY 90 tablet 0  . aspirin EC 81 MG tablet Take 81 mg by mouth daily.    Marland Kitchen atorvastatin (LIPITOR) 20 MG tablet TAKE 1 TABLET BY MOUTH IN  THE EVENING 90 tablet 0  . cetirizine (ZYRTEC) 10 MG tablet Take 10 mg by mouth every evening.     . Cholecalciferol (VITAMIN D3) 50 MCG (2000 UT) capsule Take 4,000 Units by mouth daily.     . Continuous Blood Gluc Receiver (DEXCOM G6 RECEIVER) DEVI USE TO CHECK BLOOD SUGAR UP TO 4 TIMES DAILY AS DIRECTED. 1 DEVICE PER YEAR. DX: E11.9 1 each 1  . Continuous Blood Gluc Sensor (DEXCOM G6 SENSOR) MISC USE TO CHECK BLOOD SUGAR UP TO 4 TIMES DAILY AS DIRECTED. CHANGE SENSOR EVERY 30 DAYS. DX: E11.9 3 each 4  . Continuous Blood Gluc Transmit (DEXCOM G6 TRANSMITTER) MISC USE TO CHECK BLOOD SUGAR UP TO 4 TIMES DAILY AS DIRECTED. CHANGE TRANSMITTER EVERY 3 MONTHS. DX: E11.9 1 each 12  . docusate sodium (COLACE) 100 MG capsule Take 100 mg by mouth 2 (two) times daily.    Marland Kitchen donepezil (ARICEPT) 5 MG tablet Take 5 mg by mouth at bedtime.    . famotidine (PEPCID) 20 MG tablet TAKE 1 TABLET BY MOUTH  TWICE DAILY WITH MEALS 180 tablet 0  . glucose blood test strip Test 4 times daily Dx E11.59 400 each 3  . HUMALOG KWIKPEN 100 UNIT/ML KwikPen INJECT 0 TO 15 UNITS   SUBCUTANEOUSLY 3 TIMES  DAILY BEFORE MEALS ;  70-150=8 UNITS, 151-200= 9  UNITS. 45 mL 0  . isosorbide mononitrate (IMDUR) 30 MG 24 hr tablet TAKE 1 TABLET BY MOUTH IN  THE EVENING 90 tablet 1  . LANTUS SOLOSTAR 100 UNIT/ML Solostar Pen INJECT SUBCUTANEOUSLY 52  UNITS AT BEDTIME 60 mL 3  . losartan (COZAAR) 50 MG tablet TAKE 1 TABLET BY MOUTH  DAILY 90 tablet 3  . metFORMIN (GLUCOPHAGE-XR) 500 MG 24 hr tablet TAKE 2 TABLETS BY MOUTH  TWICE DAILY 360 tablet 0  . nitroGLYCERIN (NITROSTAT) 0.4 MG SL tablet DISSOLVE ONE TABLET UNDER THE TONGUE EVERY 5 MINUTES AS NEEDED FOR CHEST PAIN.  DO NOT EXCEED A TOTAL OF 3 DOSES IN 15 MINUTES 25 tablet  3  . Omega-3 Fatty Acids (FISH OIL) 1000 MG CAPS Take by mouth.    Marland Kitchen PARoxetine (PAXIL) 20 MG tablet TAKE 1 TABLET BY MOUTH  DAILY 90 tablet 0  . polyethylene glycol powder (GLYCOLAX/MIRALAX) 17 GM/SCOOP powder Take 17 g by mouth as needed.    . tamsulosin (FLOMAX) 0.4 MG CAPS capsule TAKE 1 CAPSULE BY MOUTH  DAILY 90 capsule 3  . Turmeric 500 MG CAPS Take by mouth daily.    . vitamin B-12 (CYANOCOBALAMIN) 1000 MCG tablet Take 2,000 mcg by mouth daily.     No current facility-administered medications for this visit.    Allergies  Allergen Reactions  . Ace Inhibitors Hives, Swelling and Rash    Rash,hives,tongue swelling  . Angiotensin Receptor Blockers     Unknown reaction   . Ciprofloxacin Other (See Comments)    confusion  . Oxycodone-Acetaminophen Other (See Comments)    Unknown reaction  . Robaxin [Methocarbamol] Other (See Comments)    Confusion   . Toradol [Ketorolac Tromethamine] Other (See Comments)    confusion  . Valium Other (See Comments)    Hallucinations; confusion  . Doxycycline Hives and Rash    Social History   Socioeconomic History  . Marital status: Married    Spouse name: Dione   . Number of children: 1  . Years of education: HS  . Highest education level: High school graduate  Occupational History  . Occupation:  RETIRED    Employer: [employer]    Comment: Power plant  Tobacco Use  . Smoking status: Former Smoker    Packs/day: 1.00    Years: 20.00    Pack years: 20.00    Types: Cigarettes    Start date: 01/18/1963    Quit date: 08/11/1988    Years since quitting: 31.2  . Smokeless tobacco: Former Systems developer    Types: Chew    Quit date: 01/11/1989  . Tobacco comment: chewed 1 pack tobacco/day for 15 years  Vaping Use  . Vaping Use: Never used  Substance and Sexual Activity  . Alcohol use: No    Alcohol/week: 0.0 standard drinks  . Drug use: No  . Sexual activity: Yes  Other Topics Concern  . Not on file  Social History Narrative   No regular exercise      Daily caffeine use: 3 cups   Patient is right handed.    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 Difficulty of Paying Living Expenses: Not on file  Food Insecurity:   . Worried About Charity fundraiser in the Last Year: Not on file  . Ran Out of Food in the Last Year: Not on file  Transportation Needs:   . Lack of Transportation (Medical): Not on file  . Lack of Transportation (Non-Medical): Not on file  Physical Activity:   . Days of Exercise per Week: Not on file  . Minutes of Exercise per Session: Not on file  Stress:   . Feeling of Stress : Not on file  Social Connections:   . Frequency of Communication with Friends and Family: Not on file  . Frequency of Social Gatherings with Friends and Family: Not on file  . Attends Religious Services: Not on file  . Active Member of Clubs or Organizations: Not on file  . Attends Archivist Meetings:  Not on file  . Marital Status: Not on file  Intimate Partner Violence:   . Fear of Current or Ex-Partner: Not on file  . Emotionally Abused: Not on file  . Physically Abused: Not on file  . Sexually Abused: Not on file    Family History    Problem Relation Age of Onset  . Colon cancer Mother        Diagnosed age 75  . Cancer Mother 67  . Heart disease Father   . Heart attack Father   . Breast cancer Sister   . Heart disease Brother   . Brain cancer Sister   . Heart disease Brother   . Bladder Cancer Brother   . Cancer - Other Brother     Review of Systems:  As stated in the HPI and otherwise negative.   BP (!) 162/68   Pulse 62   Ht 5\' 6"  (1.676 m)   Wt 243 lb (110.2 kg)   SpO2 99%   BMI 39.22 kg/m   Physical Examination: General: Well developed, well nourished, NAD  HEENT: OP clear, mucus membranes moist  SKIN: warm, dry. No rashes. Neuro: No focal deficits  Musculoskeletal: Muscle strength 5/5 all ext  Psychiatric: Mood and affect normal  Neck: No JVD, no carotid bruits, no thyromegaly, no lymphadenopathy.  Lungs:Clear bilaterally, no wheezes, rhonci, crackles Cardiovascular: Regular rate and rhythm. Loud, harsh systolic murmur.Second heart sound present.   Abdomen:Soft. Bowel sounds present. Non-tender.  Extremities: No lower extremity edema. Pulses are 2 + in the bilateral DP/PT.  EKG:  EKG is ordered today. The ekg ordered today demonstrates sinus bradycardia  Echo 10/31/19:  1. Left ventricular ejection fraction, by estimation, is 60 to 65%. The  left ventricle has normal function. The left ventricle has no regional  wall motion abnormalities. There is mild left ventricular hypertrophy.  Left ventricular diastolic parameters  are indeterminate.  2. Right ventricular systolic function is normal. The right ventricular  size is normal. Tricuspid regurgitation signal is inadequate for assessing  PA pressure.  3. The mitral valve is grossly normal with mild annular calcification.  Trivial mitral valve regurgitation.  4. The aortic valve is tricuspid. There is moderate calcification of the  aortic valve. Aortic valve regurgitation is not visualized. Moderate to  severe aortic valve stenosis.  Aortic valve mean gradient measures 23.2  mmHg. Aortic valve Vmax measures 3.21  m/s. Dimentionless index 0.28.  5. Aortic dilatation noted. There is borderline dilatation of the  ascending aorta, measuring 40 mm.  6. Unable to estimate CVP.   FINDINGS  Left Ventricle: Left ventricular ejection fraction, by estimation, is 60  to 65%. The left ventricle has normal function. The left ventricle has no  regional wall motion abnormalities. The left ventricular internal cavity  size was normal in size. There is  mild left ventricular hypertrophy. Left ventricular diastolic parameters  are indeterminate.   Right Ventricle: The right ventricular size is normal. No increase in  right ventricular wall thickness. Right ventricular systolic function is  normal. Tricuspid regurgitation signal is inadequate for assessing PA  pressure.   Left Atrium: Left atrial size was normal in size.   Right Atrium: Right atrial size was normal in size.   Pericardium: There is no evidence of pericardial effusion.   Mitral Valve: The mitral valve is grossly normal. Mild mitral annular  calcification. Trivial mitral valve regurgitation.   Tricuspid Valve: The tricuspid valve is grossly normal. Tricuspid valve  regurgitation is mild.  Aortic Valve: The aortic valve is tricuspid. There is moderate  calcification of the aortic valve. There is moderate aortic valve annular  calcification. Aortic valve regurgitation is not visualized. Moderate to  severe aortic stenosis is present. Aortic  valve mean gradient measures 23.2 mmHg. Aortic valve peak gradient  measures 41.2 mmHg. Aortic valve area, by VTI measures 0.98 cm.   Pulmonic Valve: The pulmonic valve was grossly normal. Pulmonic valve  regurgitation is trivial.   Aorta: Aortic dilatation noted. There is borderline dilatation of the  ascending aorta, measuring 40 mm.   Venous: Unable to estimate CVP. IVC assessment for right atrial pressure    unable to be performed due to mechanical ventilation.   IAS/Shunts: The interatrial septum was not well visualized.     LEFT VENTRICLE  PLAX 2D  LVIDd:     5.37 cm   Diastology  LVIDs:     3.11 cm   LV e' medial:  6.87 cm/s  LV PW:     1.20 cm   LV E/e' medial: 10.9  LV IVS:    1.16 cm   LV e' lateral:  5.71 cm/s  LVOT diam:   2.10 cm   LV E/e' lateral: 13.2  LV SV:     56  LV SV Index:  26  LVOT Area:   3.46 cm    LV Volumes (MOD)  LV vol d, MOD A2C: 93.9 ml  LV vol d, MOD A4C: 120.0 ml  LV vol s, MOD A2C: 37.0 ml  LV vol s, MOD A4C: 50.0 ml  LV SV MOD A2C:   56.9 ml  LV SV MOD A4C:   120.0 ml  LV SV MOD BP:   64.7 ml   RIGHT VENTRICLE  RV S prime:   17.40 cm/s  TAPSE (M-mode): 2.3 cm   LEFT ATRIUM       Index  LA diam:    3.00 cm 1.39 cm/m  LA Vol (A2C):  41.9 ml 19.46 ml/m  LA Vol (A4C):  60.9 ml 28.28 ml/m  LA Biplane Vol:     23.90 ml/m  AORTIC VALVE  AV Area (Vmax):  1.01 cm  AV Area (Vmean):  1.03 cm  AV Area (VTI):   0.98 cm  AV Vmax:      320.80 cm/s  AV Vmean:     221.600 cm/s  AV VTI:      0.578 m  AV Peak Grad:   41.2 mmHg  AV Mean Grad:   23.2 mmHg  LVOT Vmax:     93.80 cm/s  LVOT Vmean:    66.200 cm/s  LVOT VTI:     0.163 m  LVOT/AV VTI ratio: 0.28    AORTA  Ao Root diam: 3.70 cm  Ao Asc diam: 4.00 cm   MITRAL VALVE        TRICUSPID VALVE  MV Area (PHT):       TR Peak grad:  7.0 mmHg  MV Decel Time: 209 msec  TR Vmax:    132.00 cm/s  MR Peak grad: 10.1 mmHg  MR Vmax:   159.00 cm/s SHUNTS  MV E velocity: 75.20 cm/s Systemic VTI: 0.16 m  MV A velocity: 85.90 cm/s Systemic Diam: 2.10 cm  MV E/A ratio: 0.88   Recent Labs: 10/01/2019: ALT 19; BUN 16; Creatinine, Ser 1.00; Hemoglobin 13.1; Platelets 291; Potassium 4.4; Sodium 138   Lipid Panel    Component Value Date/Time   CHOL 131 10/01/2019  0938  CHOL 139 06/02/2012 1007   TRIG 226 (H) 10/01/2019 0938   TRIG 185 (H) 04/10/2013 0937   TRIG 107 06/02/2012 1007   HDL 33 (L) 10/01/2019 0938   HDL 34 (L) 04/10/2013 0937   HDL 40 06/02/2012 1007   CHOLHDL 4.0 10/01/2019 0938   CHOLHDL 3.8 12/15/2017 0552   VLDL 30 12/15/2017 0552   LDLCALC 61 10/01/2019 0938   LDLCALC 62 04/10/2013 0937   LDLCALC 78 06/02/2012 1007     Wt Readings from Last 3 Encounters:  11/07/19 243 lb (110.2 kg)  11/02/19 239 lb (108.4 kg)  10/01/19 238 lb (108 kg)     Other studies Reviewed: Additional studies/ records that were reviewed today include: echo images, office notes Review of the above records demonstrates: moderate to severe AS   Assessment and Plan:   1. Severe Aortic Valve Stenosis: He has moderate to severe aortic stenosis.  I looked at his echo images personally and his valve appears to be moderate to severe. The data we have from the echo also suggests the aortic stenosis is moderate to severe. His symptoms would suggest that the valve is more severe. I think it will be important to exclude other things that could cause him to be so dyspneic. He is known to have CAD. A cardiac cath will be arranged to exclude obstructive CAD and also to better assess his aortic valve. If his CAD is stable, we will likely proceed with a gated cardiac CT to get more information about his aortic valve also. Given advanced age, he is not a good candidate for conventional AVR by surgical approach. I think he may be a good candidate for TAVR.   STS Risk Score: Risk of Mortality: 3.045% Renal Failure: 5.488% Permanent Stroke: 1.376% Prolonged Ventilation: 11.545% DSW Infection: 0.364% Reoperation: 3.690% Morbidity or Mortality: 18.459% Short Length of Stay: 24.484% Long Length of Stay: 9.952%   I have reviewed the natural history of aortic stenosis with the patient and their family members  who are present today. We have discussed the  limitations of medical therapy and the poor prognosis associated with symptomatic aortic stenosis. We have reviewed potential treatment options, including palliative medical therapy, conventional surgical aortic valve replacement, and transcatheter aortic valve replacement. We discussed treatment options in the context of the patient's specific comorbid medical conditions.   He would like to proceed with planning for further assessment of his aortic stenosis, possible TAVR. I will arrange a right and left heart catheterization at [facility] 11/14/19 at 10am. Risks and benefits of the cath procedure and the valve procedure are reviewed with the patient. After the cath, we will decide if we should proceed with TAVR workup or serial echo imaging.      Current medicines are reviewed at length with the patient today.  The patient does not have concerns regarding medicines.  The following changes have been made:  no change  Labs/ tests ordered today include:   Orders Placed This Encounter  Procedures  . CBC  . Basic metabolic panel   Disposition:   F/U with the valve team.    Signed, [name], MD 11/07/2019 10:23 AM    [address], [place], [place]  [address] Phone: [phone]; Fax: [phone]

## 2019-11-07 NOTE — Telephone Encounter (Signed)
Please let patient know: [name] was sent to [employer] patient assistance I re-sent documents again today Please have patient call [phone] in 2-3 business days to f/u on application

## 2019-11-08 ENCOUNTER — Ambulatory Visit: Payer: Medicare Other | Admitting: Cardiology

## 2019-11-08 ENCOUNTER — Ambulatory Visit: Payer: Medicare Other | Admitting: Pharmacist

## 2019-11-09 NOTE — Addendum Note (Signed)
Addended by: Gar Ponto on: 11/09/2019 08:19 AM   Modules accepted: Orders

## 2019-11-12 ENCOUNTER — Other Ambulatory Visit (HOSPITAL_COMMUNITY)
Admission: RE | Admit: 2019-11-12 | Discharge: 2019-11-12 | Disposition: A | Discharge status: Home / Self Care | Payer: Medicare Other | Source: Ambulatory Visit | Attending: Cardiovascular Disease | Admitting: Cardiovascular Disease

## 2019-11-12 DIAGNOSIS — I70203 Unspecified atherosclerosis of native arteries of extremities, bilateral legs: Secondary | ICD-10-CM | POA: Diagnosis not present

## 2019-11-12 DIAGNOSIS — F419 Anxiety disorder, unspecified: Secondary | ICD-10-CM | POA: Diagnosis present

## 2019-11-12 DIAGNOSIS — Z7982 Long term (current) use of aspirin: Secondary | ICD-10-CM | POA: Diagnosis not present

## 2019-11-12 DIAGNOSIS — Z9841 Cataract extraction status, right eye: Secondary | ICD-10-CM | POA: Diagnosis not present

## 2019-11-12 DIAGNOSIS — I5033 Acute on chronic diastolic (congestive) heart failure: Secondary | ICD-10-CM | POA: Diagnosis not present

## 2019-11-12 DIAGNOSIS — Z01818 Encounter for other preprocedural examination: Secondary | ICD-10-CM | POA: Insufficient documentation

## 2019-11-12 DIAGNOSIS — I708 Atherosclerosis of other arteries: Secondary | ICD-10-CM | POA: Diagnosis not present

## 2019-11-12 DIAGNOSIS — Z96643 Presence of artificial hip joint, bilateral: Secondary | ICD-10-CM | POA: Diagnosis not present

## 2019-11-12 DIAGNOSIS — I700 Atherosclerosis of aorta: Secondary | ICD-10-CM | POA: Diagnosis not present

## 2019-11-12 DIAGNOSIS — M47812 Spondylosis without myelopathy or radiculopathy, cervical region: Secondary | ICD-10-CM | POA: Diagnosis not present

## 2019-11-12 DIAGNOSIS — I358 Other nonrheumatic aortic valve disorders: Secondary | ICD-10-CM | POA: Diagnosis not present

## 2019-11-12 DIAGNOSIS — E119 Type 2 diabetes mellitus without complications: Secondary | ICD-10-CM | POA: Diagnosis not present

## 2019-11-12 DIAGNOSIS — Z77090 Contact with and (suspected) exposure to asbestos: Secondary | ICD-10-CM | POA: Diagnosis not present

## 2019-11-12 DIAGNOSIS — Z86010 Personal history of colonic polyps: Secondary | ICD-10-CM | POA: Diagnosis not present

## 2019-11-12 DIAGNOSIS — Z79899 Other long term (current) drug therapy: Secondary | ICD-10-CM | POA: Diagnosis not present

## 2019-11-12 DIAGNOSIS — I252 Old myocardial infarction: Secondary | ICD-10-CM | POA: Diagnosis not present

## 2019-11-12 DIAGNOSIS — I25110 Atherosclerotic heart disease of native coronary artery with unstable angina pectoris: Secondary | ICD-10-CM | POA: Diagnosis not present

## 2019-11-12 DIAGNOSIS — Z87442 Personal history of urinary calculi: Secondary | ICD-10-CM | POA: Diagnosis not present

## 2019-11-12 DIAGNOSIS — E785 Hyperlipidemia, unspecified: Secondary | ICD-10-CM | POA: Diagnosis not present

## 2019-11-12 DIAGNOSIS — Z8661 Personal history of infections of the central nervous system: Secondary | ICD-10-CM | POA: Diagnosis not present

## 2019-11-12 DIAGNOSIS — K219 Gastro-esophageal reflux disease without esophagitis: Secondary | ICD-10-CM | POA: Diagnosis not present

## 2019-11-12 DIAGNOSIS — I771 Stricture of artery: Secondary | ICD-10-CM | POA: Diagnosis not present

## 2019-11-12 DIAGNOSIS — Z20822 Contact with and (suspected) exposure to covid-19: Secondary | ICD-10-CM | POA: Insufficient documentation

## 2019-11-12 DIAGNOSIS — Z955 Presence of coronary angioplasty implant and graft: Secondary | ICD-10-CM | POA: Diagnosis not present

## 2019-11-12 DIAGNOSIS — Z961 Presence of intraocular lens: Secondary | ICD-10-CM | POA: Diagnosis not present

## 2019-11-12 DIAGNOSIS — Z96653 Presence of artificial knee joint, bilateral: Secondary | ICD-10-CM | POA: Diagnosis not present

## 2019-11-12 DIAGNOSIS — I350 Nonrheumatic aortic (valve) stenosis: Secondary | ICD-10-CM | POA: Diagnosis not present

## 2019-11-12 DIAGNOSIS — I110 Hypertensive heart disease with heart failure: Secondary | ICD-10-CM | POA: Diagnosis not present

## 2019-11-12 DIAGNOSIS — R0600 Dyspnea, unspecified: Secondary | ICD-10-CM | POA: Diagnosis present

## 2019-11-12 DIAGNOSIS — Z794 Long term (current) use of insulin: Secondary | ICD-10-CM | POA: Diagnosis not present

## 2019-11-12 DIAGNOSIS — G4733 Obstructive sleep apnea (adult) (pediatric): Secondary | ICD-10-CM | POA: Diagnosis not present

## 2019-11-12 DIAGNOSIS — I200 Unstable angina: Secondary | ICD-10-CM | POA: Diagnosis not present

## 2019-11-12 DIAGNOSIS — I2510 Atherosclerotic heart disease of native coronary artery without angina pectoris: Secondary | ICD-10-CM | POA: Diagnosis not present

## 2019-11-12 LAB — SARS CORONAVIRUS 2 (TAT 6-24 HRS): SARS Coronavirus 2: NEGATIVE

## 2019-11-13 ENCOUNTER — Telehealth: Payer: Self-pay | Admitting: *Deleted

## 2019-11-13 NOTE — Telephone Encounter (Addendum)
Pt contacted pre-catheterization scheduled at [facility] for: Wednesday November 14, 2019 10 AM Verified arrival time and place: [facility]) at: 8 AM   No solid food after midnight prior to cath, clear liquids until 5 AM day of procedure.  Hold: Metformin-day of procedure and 48 hours post procedure Insulin-AM of procedure/1/2 usual Insulin PM prior to procedure  Except hold medications AM meds can be  taken pre-cath with sips of water including: ASA 81 mg   Confirmed patient has responsible adult to drive home post procedure and be with patient first 24 hours after arriving home: yes  You are allowed ONE visitor in the waiting room during the time you are at the hospital for your procedure. Both you and your visitor must wear a mask once you enter the hospital.       COVID-19 Pre-Screening Questions:  . In the past 14 days have you had a new cough, new headache, new nasal congestion, fever (100.4 or greater) unexplained body aches, new sore throat, or sudden loss of taste or sense of smell? no . In the past 14 days have you been around anyone with known Covid 19? no   Reviewed procedure/mask/visitor instructions, COVID-19 questions reviewed with patient's wife (DPR).

## 2019-11-13 NOTE — Telephone Encounter (Signed)
No answer, voicemail message. 

## 2019-11-14 ENCOUNTER — Inpatient Hospital Stay (HOSPITAL_COMMUNITY)
Admission: AD | Admit: 2019-11-14 | Discharge: 2019-11-16 | DRG: 286 | Disposition: A | Discharge status: Home / Self Care | Payer: Medicare Other | Attending: Cardiovascular Disease | Admitting: Cardiovascular Disease

## 2019-11-14 ENCOUNTER — Encounter (HOSPITAL_COMMUNITY)
Admission: AD | Disposition: A | Discharge status: Home / Self Care | Payer: Self-pay | Source: Home / Self Care | Attending: Cardiovascular Disease

## 2019-11-14 ENCOUNTER — Other Ambulatory Visit: Payer: Self-pay

## 2019-11-14 ENCOUNTER — Encounter (HOSPITAL_COMMUNITY): Payer: Self-pay | Admitting: Cardiovascular Disease

## 2019-11-14 ENCOUNTER — Other Ambulatory Visit: Payer: Self-pay | Admitting: *Deleted

## 2019-11-14 DIAGNOSIS — Z955 Presence of coronary angioplasty implant and graft: Secondary | ICD-10-CM

## 2019-11-14 DIAGNOSIS — Z86010 Personal history of colonic polyps: Secondary | ICD-10-CM

## 2019-11-14 DIAGNOSIS — I200 Unstable angina: Secondary | ICD-10-CM | POA: Diagnosis not present

## 2019-11-14 DIAGNOSIS — I110 Hypertensive heart disease with heart failure: Secondary | ICD-10-CM | POA: Diagnosis present

## 2019-11-14 DIAGNOSIS — Z888 Allergy status to other drugs, medicaments and biological substances status: Secondary | ICD-10-CM

## 2019-11-14 DIAGNOSIS — F419 Anxiety disorder, unspecified: Secondary | ICD-10-CM | POA: Diagnosis present

## 2019-11-14 DIAGNOSIS — I252 Old myocardial infarction: Secondary | ICD-10-CM | POA: Diagnosis not present

## 2019-11-14 DIAGNOSIS — I700 Atherosclerosis of aorta: Secondary | ICD-10-CM | POA: Diagnosis present

## 2019-11-14 DIAGNOSIS — Z881 Allergy status to other antibiotic agents status: Secondary | ICD-10-CM

## 2019-11-14 DIAGNOSIS — I2510 Atherosclerotic heart disease of native coronary artery without angina pectoris: Secondary | ICD-10-CM

## 2019-11-14 DIAGNOSIS — I350 Nonrheumatic aortic (valve) stenosis: Secondary | ICD-10-CM | POA: Diagnosis not present

## 2019-11-14 DIAGNOSIS — I5033 Acute on chronic diastolic (congestive) heart failure: Secondary | ICD-10-CM | POA: Diagnosis present

## 2019-11-14 DIAGNOSIS — G4733 Obstructive sleep apnea (adult) (pediatric): Secondary | ICD-10-CM | POA: Diagnosis present

## 2019-11-14 DIAGNOSIS — Z885 Allergy status to narcotic agent status: Secondary | ICD-10-CM

## 2019-11-14 DIAGNOSIS — K219 Gastro-esophageal reflux disease without esophagitis: Secondary | ICD-10-CM | POA: Diagnosis present

## 2019-11-14 DIAGNOSIS — R0600 Dyspnea, unspecified: Secondary | ICD-10-CM | POA: Diagnosis present

## 2019-11-14 DIAGNOSIS — Z794 Long term (current) use of insulin: Secondary | ICD-10-CM | POA: Diagnosis not present

## 2019-11-14 DIAGNOSIS — Z96643 Presence of artificial hip joint, bilateral: Secondary | ICD-10-CM | POA: Diagnosis present

## 2019-11-14 DIAGNOSIS — Z7982 Long term (current) use of aspirin: Secondary | ICD-10-CM | POA: Diagnosis not present

## 2019-11-14 DIAGNOSIS — I25110 Atherosclerotic heart disease of native coronary artery with unstable angina pectoris: Secondary | ICD-10-CM | POA: Diagnosis present

## 2019-11-14 DIAGNOSIS — Z8661 Personal history of infections of the central nervous system: Secondary | ICD-10-CM | POA: Diagnosis not present

## 2019-11-14 DIAGNOSIS — Z87442 Personal history of urinary calculi: Secondary | ICD-10-CM | POA: Diagnosis not present

## 2019-11-14 DIAGNOSIS — Z9841 Cataract extraction status, right eye: Secondary | ICD-10-CM | POA: Diagnosis not present

## 2019-11-14 DIAGNOSIS — Z79899 Other long term (current) drug therapy: Secondary | ICD-10-CM

## 2019-11-14 DIAGNOSIS — E785 Hyperlipidemia, unspecified: Secondary | ICD-10-CM | POA: Diagnosis present

## 2019-11-14 DIAGNOSIS — Z87891 Personal history of nicotine dependence: Secondary | ICD-10-CM

## 2019-11-14 DIAGNOSIS — Z77090 Contact with and (suspected) exposure to asbestos: Secondary | ICD-10-CM | POA: Diagnosis present

## 2019-11-14 DIAGNOSIS — Z961 Presence of intraocular lens: Secondary | ICD-10-CM | POA: Diagnosis present

## 2019-11-14 DIAGNOSIS — Z96653 Presence of artificial knee joint, bilateral: Secondary | ICD-10-CM | POA: Diagnosis present

## 2019-11-14 DIAGNOSIS — M47812 Spondylosis without myelopathy or radiculopathy, cervical region: Secondary | ICD-10-CM | POA: Diagnosis present

## 2019-11-14 DIAGNOSIS — E1159 Type 2 diabetes mellitus with other circulatory complications: Secondary | ICD-10-CM

## 2019-11-14 DIAGNOSIS — E119 Type 2 diabetes mellitus without complications: Secondary | ICD-10-CM | POA: Diagnosis present

## 2019-11-14 DIAGNOSIS — Z7984 Long term (current) use of oral hypoglycemic drugs: Secondary | ICD-10-CM

## 2019-11-14 HISTORY — PX: RIGHT/LEFT HEART CATH AND CORONARY ANGIOGRAPHY: CATH118266

## 2019-11-14 LAB — HEMOGLOBIN A1C
Hgb A1c MFr Bld: 7.6 % — ABNORMAL HIGH (ref 4.8–5.6)
Mean Plasma Glucose: 171.42 mg/dL

## 2019-11-14 LAB — GLUCOSE, CAPILLARY
Glucose-Capillary: 220 mg/dL — ABNORMAL HIGH (ref 70–99)
Glucose-Capillary: 182 mg/dL — ABNORMAL HIGH (ref 70–99)
Glucose-Capillary: 304 mg/dL — ABNORMAL HIGH (ref 70–99)
Glucose-Capillary: 242 mg/dL — ABNORMAL HIGH (ref 70–99)

## 2019-11-14 LAB — POCT I-STAT 7, (LYTES, BLD GAS, ICA,H+H)
Acid-Base Excess: 0 mmol/L (ref 0.0–2.0)
Bicarbonate: 24.9 mmol/L (ref 20.0–28.0)
Calcium, Ion: 1.16 mmol/L (ref 1.15–1.40)
HCT: 37 % — ABNORMAL LOW (ref 39.0–52.0)
Hemoglobin: 12.6 g/dL — ABNORMAL LOW (ref 13.0–17.0)
O2 Saturation: 98 %
Potassium: 3.8 mmol/L (ref 3.5–5.1)
Sodium: 140 mmol/L (ref 135–145)
TCO2: 26 mmol/L (ref 22–32)
pCO2 arterial: 41 mmHg (ref 32.0–48.0)
pH, Arterial: 7.392 (ref 7.350–7.450)
pO2, Arterial: 98 mmHg (ref 83.0–108.0)

## 2019-11-14 LAB — POCT I-STAT EG7
Acid-Base Excess: 1 mmol/L (ref 0.0–2.0)
Bicarbonate: 26.7 mmol/L (ref 20.0–28.0)
Calcium, Ion: 1.18 mmol/L (ref 1.15–1.40)
HCT: 38 % — ABNORMAL LOW (ref 39.0–52.0)
Hemoglobin: 12.9 g/dL — ABNORMAL LOW (ref 13.0–17.0)
O2 Saturation: 65 %
Potassium: 3.8 mmol/L (ref 3.5–5.1)
Sodium: 141 mmol/L (ref 135–145)
TCO2: 28 mmol/L (ref 22–32)
pCO2, Ven: 47.7 mmHg (ref 44.0–60.0)
pH, Ven: 7.355 (ref 7.250–7.430)
pO2, Ven: 35 mmHg (ref 32.0–45.0)

## 2019-11-14 SURGERY — RIGHT/LEFT HEART CATH AND CORONARY ANGIOGRAPHY
Anesthesia: LOCAL

## 2019-11-14 MED ORDER — SODIUM CHLORIDE 0.9% FLUSH
3.0000 mL | Freq: Two times a day (BID) | INTRAVENOUS | Status: DC
  Administered 2019-11-14 – 2019-11-16 (×3): 3 mL via INTRAVENOUS

## 2019-11-14 MED ORDER — HYDROCORTISONE 1 % EX OINT
TOPICAL_OINTMENT | Freq: Every day | CUTANEOUS | Status: DC | PRN
  Filled 2019-11-14: qty 28

## 2019-11-14 MED ORDER — NITROGLYCERIN 0.4 MG SL SUBL: 0.4000 mg | SUBLINGUAL_TABLET | SUBLINGUAL | Status: DC | PRN

## 2019-11-14 MED ORDER — HEPARIN (PORCINE) IN NACL 1000-0.9 UT/500ML-% IV SOLN
INTRAVENOUS | Status: DC | PRN
  Administered 2019-11-14 (×2): 500 mL

## 2019-11-14 MED ORDER — AMLODIPINE BESYLATE 5 MG PO TABS
5.0000 mg | ORAL_TABLET | Freq: Every day | ORAL | Status: DC
  Administered 2019-11-15 – 2019-11-16 (×2): 5 mg via ORAL
  Filled 2019-11-14 (×2): qty 1

## 2019-11-14 MED ORDER — ISOSORBIDE MONONITRATE ER 30 MG PO TB24
30.0000 mg | ORAL_TABLET | Freq: Every day | ORAL | Status: DC
  Administered 2019-11-14: 30 mg via ORAL
  Filled 2019-11-14: qty 1

## 2019-11-14 MED ORDER — SODIUM CHLORIDE 0.9% FLUSH: 3.0000 mL | INTRAVENOUS | Status: DC | PRN

## 2019-11-14 MED ORDER — IOHEXOL 350 MG/ML SOLN
INTRAVENOUS | Status: DC | PRN
  Administered 2019-11-14: 105 mL via INTRACARDIAC

## 2019-11-14 MED ORDER — ASPIRIN 81 MG PO CHEW: 81.0000 mg | CHEWABLE_TABLET | ORAL | Status: DC

## 2019-11-14 MED ORDER — ASPIRIN EC 81 MG PO TBEC: 81.0000 mg | DELAYED_RELEASE_TABLET | Freq: Every day | ORAL | Status: DC

## 2019-11-14 MED ORDER — SODIUM CHLORIDE 0.9 % WEIGHT BASED INFUSION: 3.0000 mL/kg/h | INTRAVENOUS | Status: DC

## 2019-11-14 MED ORDER — HEPARIN (PORCINE) IN NACL 1000-0.9 UT/500ML-% IV SOLN
INTRAVENOUS | Status: AC
  Filled 2019-11-14: qty 1000

## 2019-11-14 MED ORDER — LIDOCAINE HCL (PF) 1 % IJ SOLN
INTRAMUSCULAR | Status: DC | PRN
  Administered 2019-11-14: 10 mL
  Administered 2019-11-14: 2 mL

## 2019-11-14 MED ORDER — INSULIN GLARGINE 100 UNIT/ML ~~LOC~~ SOLN
56.0000 [IU] | Freq: Every day | SUBCUTANEOUS | Status: DC
  Administered 2019-11-14 – 2019-11-15 (×2): 56 [IU] via SUBCUTANEOUS
  Filled 2019-11-14 (×3): qty 0.56

## 2019-11-14 MED ORDER — HYDRALAZINE HCL 20 MG/ML IJ SOLN: 10.0000 mg | INTRAMUSCULAR | Status: AC | PRN

## 2019-11-14 MED ORDER — SODIUM CHLORIDE 0.9 % WEIGHT BASED INFUSION: 1.0000 mL/kg/h | INTRAVENOUS | Status: DC

## 2019-11-14 MED ORDER — INSULIN ASPART 100 UNIT/ML ~~LOC~~ SOLN
0.0000 [IU] | Freq: Three times a day (TID) | SUBCUTANEOUS | Status: DC
  Administered 2019-11-14: 11 [IU] via SUBCUTANEOUS
  Administered 2019-11-15: 5 [IU] via SUBCUTANEOUS

## 2019-11-14 MED ORDER — SODIUM CHLORIDE 0.9 % IV SOLN: INTRAVENOUS | Status: AC

## 2019-11-14 MED ORDER — LIDOCAINE HCL (PF) 1 % IJ SOLN
INTRAMUSCULAR | Status: AC
  Filled 2019-11-14: qty 30

## 2019-11-14 MED ORDER — FENTANYL CITRATE (PF) 100 MCG/2ML IJ SOLN
INTRAMUSCULAR | Status: DC | PRN
  Administered 2019-11-14: 50 ug via INTRAVENOUS

## 2019-11-14 MED ORDER — PAROXETINE HCL 20 MG PO TABS
20.0000 mg | ORAL_TABLET | Freq: Every day | ORAL | Status: DC
  Administered 2019-11-14: 20 mg via ORAL
  Filled 2019-11-14: qty 1

## 2019-11-14 MED ORDER — TAMSULOSIN HCL 0.4 MG PO CAPS
0.4000 mg | ORAL_CAPSULE | Freq: Every day | ORAL | Status: DC
  Administered 2019-11-14 – 2019-11-16 (×3): 0.4 mg via ORAL
  Filled 2019-11-14 (×3): qty 1

## 2019-11-14 MED ORDER — ONDANSETRON HCL 4 MG/2ML IJ SOLN: 4.0000 mg | Freq: Four times a day (QID) | INTRAMUSCULAR | Status: DC | PRN

## 2019-11-14 MED ORDER — ACETAMINOPHEN 500 MG PO TABS
1000.0000 mg | ORAL_TABLET | Freq: Three times a day (TID) | ORAL | Status: DC
  Administered 2019-11-14 – 2019-11-16 (×7): 1000 mg via ORAL
  Filled 2019-11-14 (×7): qty 2

## 2019-11-14 MED ORDER — SODIUM CHLORIDE 0.9% FLUSH: 3.0000 mL | Freq: Two times a day (BID) | INTRAVENOUS | Status: DC

## 2019-11-14 MED ORDER — FUROSEMIDE 10 MG/ML IJ SOLN
40.0000 mg | Freq: Two times a day (BID) | INTRAMUSCULAR | Status: DC
  Administered 2019-11-14 – 2019-11-16 (×4): 40 mg via INTRAVENOUS
  Filled 2019-11-14 (×4): qty 4

## 2019-11-14 MED ORDER — DONEPEZIL HCL 5 MG PO TABS
5.0000 mg | ORAL_TABLET | Freq: Every day | ORAL | Status: DC
  Administered 2019-11-14 – 2019-11-15 (×2): 5 mg via ORAL
  Filled 2019-11-14 (×2): qty 1

## 2019-11-14 MED ORDER — FAMOTIDINE 20 MG PO TABS
20.0000 mg | ORAL_TABLET | Freq: Two times a day (BID) | ORAL | Status: DC
  Administered 2019-11-14 – 2019-11-15 (×3): 20 mg via ORAL
  Filled 2019-11-14 (×4): qty 1

## 2019-11-14 MED ORDER — ATORVASTATIN CALCIUM 10 MG PO TABS
20.0000 mg | ORAL_TABLET | Freq: Every day | ORAL | Status: DC
  Administered 2019-11-14: 20 mg via ORAL
  Filled 2019-11-14: qty 2

## 2019-11-14 MED ORDER — SODIUM CHLORIDE 0.9 % IV SOLN: 250.0000 mL | INTRAVENOUS | Status: DC | PRN

## 2019-11-14 MED ORDER — LORATADINE 10 MG PO TABS
10.0000 mg | ORAL_TABLET | Freq: Every day | ORAL | Status: DC
  Administered 2019-11-15: 10 mg via ORAL
  Filled 2019-11-14: qty 1

## 2019-11-14 MED ORDER — FENTANYL CITRATE (PF) 100 MCG/2ML IJ SOLN
INTRAMUSCULAR | Status: AC
  Filled 2019-11-14: qty 2

## 2019-11-14 MED ORDER — LABETALOL HCL 5 MG/ML IV SOLN: 10.0000 mg | INTRAVENOUS | Status: AC | PRN

## 2019-11-14 SURGICAL SUPPLY — 17 items
CATH INFINITI 5 FR AL2 (CATHETERS) ×1 IMPLANT
CATH INFINITI 5FR AL1 (CATHETERS) ×1 IMPLANT
CATH INFINITI 5FR MULTPACK ANG (CATHETERS) ×1 IMPLANT
CATH LAUNCHER 5F EBU4.0 (CATHETERS) ×1 IMPLANT
CATH SWAN GANZ 7F STRAIGHT (CATHETERS) ×1 IMPLANT
CLOSURE MYNX CONTROL 5F (Vascular Products) ×1 IMPLANT
GLIDESHEATH SLENDER 7FR .021G (SHEATH) ×1 IMPLANT
KIT HEART LEFT (KITS) ×2 IMPLANT
KIT MICROPUNCTURE NIT STIFF (SHEATH) ×1 IMPLANT
PACK CARDIAC CATHETERIZATION (CUSTOM PROCEDURE TRAY) ×2 IMPLANT
SHEATH PINNACLE 5F 10CM (SHEATH) ×1 IMPLANT
SHEATH PROBE COVER 6X72 (BAG) ×1 IMPLANT
TRANSDUCER W/STOPCOCK (MISCELLANEOUS) ×2 IMPLANT
TUBING CIL FLEX 10 FLL-RA (TUBING) ×2 IMPLANT
WIRE EMERALD 3MM-J .025X260CM (WIRE) ×1 IMPLANT
WIRE EMERALD 3MM-J .035X150CM (WIRE) ×1 IMPLANT
WIRE EMERALD ST .035X150CM (WIRE) ×1 IMPLANT

## 2019-11-14 NOTE — Interval H&P Note (Signed)
History and Physical Interval Note:  11/14/2019 8:31 AM  Attila R Cuthbertson  has presented today for surgery, with the diagnosis of aortic stenosis.  The various methods of treatment have been discussed with the patient and family. After consideration of risks, benefits and other options for treatment, the patient has consented to  Procedure(s): RIGHT/LEFT HEART CATH AND CORONARY ANGIOGRAPHY (N/A) as a surgical intervention.  The patient's history has been reviewed, patient examined, no change in status, stable for surgery.  I have reviewed the patient's chart and labs.  Questions were answered to the patient's satisfaction.    Cath Lab Visit (complete for each Cath Lab visit)  Clinical Evaluation Leading to the Procedure:   ACS: No.  Non-ACS:    Anginal Classification: CCS II  Anti-ischemic medical therapy: Maximal Therapy (2 or more classes of medications)  Non-Invasive Test Results: No non-invasive testing performed  Prior CABG: No previous CABG        [name]

## 2019-11-14 NOTE — H&P (Signed)
Cardiology Admission History and Physical:   Patient ID: [name] MRN: [id number]; DOB: 01/27/1940   Admission date: 11/14/2019  Primary Care Provider: Dettinger, Fransisca Kaufmann, MD [facility] Cardiologist: Rozann Lesches, MD  [place] Electrophysiologist:  None   Chief Complaint:  Dyspnea.   History of Present Illness:   79 yo male with history of CAD, HTN, GERD, hyperlipidemia, sleep apnea, DM on insulin and aortic stenosis who is being admitted today post cath. I saw him in the office recently for further discussion regarding his aortic stenosis. He is known to have CAD with last cardiac cath in 2006 at which time a bare metal stent was placed in the RCA. No cardiac cath since then. He has been followed for moderate aortic stenosis. Most recent echocardiogram 10/31/19 with LVEF=60-65%, mild LVH. The aortic valve leaflets are thickened and calcified with limited leaflet excursion. Mean gradient 23.2 mmHg, peak gradient 41.2 mmHg, AVA 0.98 cm2, dimensionless index 0.28. His echo parameters suggest moderate to severe AS. He was seen recently by Dr. Domenic Polite and described worsened dyspnea on exertion and progressive fatigue. He has had no chest pain. He told me that he can walk 50 yards before becoming short of breath. He rests and then can walk again. He denies chest pain, dizziness, near syncope, syncope or lower extremity edema. He is retired. He worked at [employer] but has been retired for 24 years. He lives near [place], [place] with his wife. He has full dentures on the top and a bridge on the bottom. No active dental issues.   Cardiac cath today with three vessel CAD, elevated filling pressures and moderately severe AS.    Past Medical History:  Diagnosis Date  . Anxiety   . Aortic atherosclerosis ([place]) 12/19/2017  . Arthritis   . Bursitis of hip   . Cervical spondylosis   . Chronic headaches   . Coronary atherosclerosis of native coronary artery    BMS nondominant RCA  12/2004  . Essential hypertension   . GERD (gastroesophageal reflux disease)   . History of adenomatous polyp of colon   . History of kidney stones   . History of MI (myocardial infarction) 12/2004  . History of viral meningitis 02/28/2018   December 2019  . Hyperlipidemia   . Internal hemorrhoids   . Low back pain   . Nocturia more than twice per night   . OSA (obstructive sleep apnea)   . Spinal stenosis   . Type 2 diabetes mellitus treated with insulin ([place])   . Wears dentures    Upper    Past Surgical History:  Procedure Laterality Date  . CARDIAC CATHETERIZATION  06-14-2004  dr Lia Foyer   total occlusion mD1, RI 30-40%, mRCA nondominant hazy 80% and scattered 30-40%,  ef 71% (medical mangement)  . CARDIOVASCULAR STRESS TEST  09-17-2015   dr Domenic Polite   Low risk nuclear study w/ reversible mild small anteroapical defect,  normal LV function and wall motion, nuclear stress ef 61%  . CATARACT EXTRACTION W/ INTRAOCULAR LENS IMPLANT Right 07/2014  . COLONOSCOPY  09/10/2008   normal  . CORONARY ANGIOPLASTY WITH STENT PLACEMENT  12-17-2004   dr benismhon/ dr Olevia Perches   nonobstructive cad LAD and LCFx,  BMS x1 to total occlusion RCA   . CYSTOSCOPY W/ URETEROSCOPY W/ LITHOTRIPSY  08/2010  . ERCP  03/14/2008  . EYE SURGERY Bilateral    cataracts  . LAPAROSCOPIC CHOLECYSTECTOMY  05/2012  . LEFT HEART CATHETERIZATION WITH CORONARY ANGIOGRAM N/A 05/14/2011  Procedure: LEFT HEART CATHETERIZATION WITH CORONARY ANGIOGRAM;  Surgeon: Sherren Mocha, MD;  Location: [facility] CATH LAB;  Service: Cardiovascular;  Laterality: N/A;    non-obstructive LM, LAD, LCFx, patent RCA stent  . LUMBAR LAMINECTOMY/DECOMPRESSION MICRODISCECTOMY N/A 02/24/2017   Procedure: Microlumbar decompression L4-5, L5-S1;  Surgeon: Susa Day, MD;  Location: [facility] ORS;  Service: Orthopedics;  Laterality: N/A;  120 mins  . ROTATOR CUFF REPAIR Right 03/2002  . ROTATOR CUFF REPAIR Left 12/11/2015  . TOTAL HIP ARTHROPLASTY Right  12/20/2008  . TOTAL HIP ARTHROPLASTY Left 07/05/2018   Procedure: TOTAL HIP ARTHROPLASTY ANTERIOR APPROACH;  Surgeon: Gaynelle Arabian, MD;  Location: [facility] ORS;  Service: Orthopedics;  Laterality: Left;  139min  . TOTAL KNEE ARTHROPLASTY Bilateral left 12-11-2003/  right 07-31-2004   dr Wynelle Link [facility]  . UPPER GASTROINTESTINAL ENDOSCOPY  01/08/2008   bx, inlet patch, duodenitis  . YAG LASER APPLICATION Right 0/27/7412   Procedure: YAG LASER APPLICATION;  Surgeon: Williams Che, MD;  Location: [facility] ORS;  Service: Ophthalmology;  Laterality: Right;     Medications Prior to Admission: Prior to Admission medications   Medication Sig Start Date End Date Taking? Authorizing Provider  acetaminophen (TYLENOL) 650 MG CR tablet Take 1,300 mg by mouth in the morning, at noon, and at bedtime.    Yes [provider]  amLODipine (NORVASC) 5 MG tablet TAKE 1 TABLET BY MOUTH  DAILY Patient taking differently: Take 5 mg by mouth daily.  10/24/19  Yes Dettinger, Fransisca Kaufmann, MD  aspirin EC 81 MG tablet Take 81 mg by mouth at bedtime.    Yes [provider]  atorvastatin (LIPITOR) 20 MG tablet TAKE 1 TABLET BY MOUTH IN  THE EVENING Patient taking differently: Take 20 mg by mouth at bedtime.  10/24/19  Yes Dettinger, Fransisca Kaufmann, MD  butalbital-acetaminophen-caffeine (FIORICET) [phone] MG tablet Take 1 tablet by mouth 2 (two) times daily as needed for headache.   Yes [provider]  cetirizine (ZYRTEC) 10 MG tablet Take 10 mg by mouth daily with supper.    Yes [provider]  Cholecalciferol (VITAMIN D3) 50 MCG (2000 UT) capsule Take 4,000 Units by mouth daily.    Yes [provider]  Continuous Blood Gluc Receiver (DEXCOM G6 RECEIVER) DEVI USE TO CHECK BLOOD SUGAR UP TO 4 TIMES DAILY AS DIRECTED. 1 DEVICE PER YEAR. DX: E11.9 06/28/19  Yes Dettinger, Fransisca Kaufmann, MD  Continuous Blood Gluc Sensor (DEXCOM G6 SENSOR) MISC USE TO CHECK BLOOD SUGAR UP TO 4 TIMES DAILY AS DIRECTED.  CHANGE SENSOR EVERY 30 DAYS. DX: E11.9 06/28/19  Yes Dettinger, Fransisca Kaufmann, MD  Continuous Blood Gluc Transmit (DEXCOM G6 TRANSMITTER) MISC USE TO CHECK BLOOD SUGAR UP TO 4 TIMES DAILY AS DIRECTED. CHANGE TRANSMITTER EVERY 3 MONTHS. DX: E11.9 06/28/19  Yes Dettinger, Fransisca Kaufmann, MD  docusate sodium (COLACE) 100 MG capsule Take 100 mg by mouth daily as needed for mild constipation or moderate constipation.    Yes [provider]  donepezil (ARICEPT) 5 MG tablet Take 5 mg by mouth at bedtime.   Yes [provider]  famotidine (PEPCID) 20 MG tablet TAKE 1 TABLET BY MOUTH  TWICE DAILY WITH MEALS Patient taking differently: Take 20 mg by mouth 2 (two) times daily.  10/24/19  Yes Dettinger, Fransisca Kaufmann, MD  glucose blood test strip Test 4 times daily Dx E11.59 11/23/18  Yes Dettinger, Fransisca Kaufmann, MD  HUMALOG KWIKPEN 100 UNIT/ML KwikPen INJECT 0 TO 15 UNITS  SUBCUTANEOUSLY 3 TIMES  DAILY BEFORE MEALS ;  70-150=8 UNITS, 151-200= 9  UNITS. Patient taking differently: Inject 15-20 Units into the skin 3 (three) times daily before meals. Sliding scale 07/17/19  Yes Dettinger, Fransisca Kaufmann, MD  isosorbide mononitrate (IMDUR) 30 MG 24 hr tablet TAKE 1 TABLET BY MOUTH IN  THE EVENING Patient taking differently: Take 30 mg by mouth at bedtime.  09/10/19  Yes Satira Sark, MD  LANTUS SOLOSTAR 100 UNIT/ML Solostar Pen INJECT SUBCUTANEOUSLY 52  UNITS AT BEDTIME Patient taking differently: Inject 56 Units into the skin at bedtime.  04/23/19  Yes Dettinger, Fransisca Kaufmann, MD  losartan (COZAAR) 50 MG tablet TAKE 1 TABLET BY MOUTH  DAILY Patient taking differently: Take 50 mg by mouth daily.  07/26/19  Yes Dettinger, Fransisca Kaufmann, MD  metFORMIN (GLUCOPHAGE-XR) 500 MG 24 hr tablet TAKE 2 TABLETS BY MOUTH  TWICE DAILY Patient taking differently: Take 1,000 mg by mouth in the morning and at bedtime.  09/10/19  Yes Dettinger, Fransisca Kaufmann, MD  Omega-3 Fatty Acids (FISH OIL) 1200 MG CAPS Take 1,200-2,400 mg by mouth See admin  instructions. Tale 1200 mg in the morning and 2400 mg in the evening   Yes [provider]  PARoxetine (PAXIL) 20 MG tablet TAKE 1 TABLET BY MOUTH  DAILY Patient taking differently: Take 20 mg by mouth at bedtime.  10/24/19  Yes Dettinger, Fransisca Kaufmann, MD  tamsulosin (FLOMAX) 0.4 MG CAPS capsule TAKE 1 CAPSULE BY MOUTH  DAILY Patient taking differently: Take 0.4 mg by mouth daily with supper.  12/18/18  Yes Dettinger, Fransisca Kaufmann, MD  Turmeric Curcumin 500 MG CAPS Take 500 mg by mouth daily.   Yes [provider]  vitamin B-12 (CYANOCOBALAMIN) 1000 MCG tablet Take 2,000 mcg by mouth daily.   Yes [provider]  nitroGLYCERIN (NITROSTAT) 0.4 MG SL tablet DISSOLVE ONE TABLET UNDER THE TONGUE EVERY 5 MINUTES AS NEEDED FOR CHEST PAIN.  DO NOT EXCEED A TOTAL OF 3 DOSES IN 15 MINUTES Patient taking differently: Place 0.4 mg under the tongue every 5 (five) minutes as needed for chest pain. DISSOLVE ONE TABLET UNDER THE TONGUE EVERY 5 MINUTES AS NEEDED FOR CHEST PAIN.  DO NOT EXCEED A TOTAL OF 3 DOSES IN 15 MINUTES 10/24/19   Satira Sark, MD     Allergies:    Allergies  Allergen Reactions  . Ace Inhibitors Hives, Swelling and Rash    Rash,hives,tongue swelling  . Angiotensin Receptor Blockers     Unknown reaction   . Ciprofloxacin Other (See Comments)    confusion  . Oxycodone-Acetaminophen Other (See Comments)    Unknown reaction  . Robaxin [Methocarbamol] Other (See Comments)    Confusion   . Toradol [Ketorolac Tromethamine] Other (See Comments)    confusion  . Valium Other (See Comments)    Hallucinations; confusion  . Doxycycline Hives and Rash    Social History:   Social History   Socioeconomic History  . Marital status: Married    Spouse name: Dione   . Number of children: 1  . Years of education: HS  . Highest education level: High school graduate  Occupational History  . Occupation: RETIRED    Employer: [employer]    Comment: Power plant    Tobacco Use  . Smoking status: Former Smoker    Packs/day: 1.00    Years: 20.00    Pack years: 20.00    Types: Cigarettes    Start date: 01/18/1963    Quit date: 08/11/1988  Years since quitting: 31.2  . Smokeless tobacco: Former Systems developer    Types: Chew    Quit date: 01/11/1989  . Tobacco comment: chewed 1 pack tobacco/day for 15 years  Vaping Use  . Vaping Use: Never used  Substance and Sexual Activity  . Alcohol use: No    Alcohol/week: 0.0 standard drinks  . Drug use: No  . Sexual activity: Yes  Other Topics Concern  . Not on file  Social History Narrative   No regular exercise      Daily caffeine use: 3 cups   Patient is right handed.    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 Difficulty of Paying Living Expenses: Not on file  Food Insecurity:   . Worried About Charity fundraiser in the Last Year: Not on file  . Ran Out of Food in the Last Year: Not on file  Transportation Needs:   . Lack of Transportation (Medical): Not on file  . Lack of Transportation (Non-Medical): Not on file  Physical Activity:   . Days of Exercise per Week: Not on file  . Minutes of Exercise per Session: Not on file  Stress:   . Feeling of Stress : Not on file  Social Connections:   . Frequency of Communication with Friends and Family: Not on file  . Frequency of Social Gatherings with Friends and Family: Not on file  . Attends Religious Services: Not on file  . Active Member of Clubs or Organizations: Not on file  . Attends Archivist Meetings: Not on file  . Marital Status: Not on file  Intimate Partner Violence:   . Fear of Current or Ex-Partner: Not on file  . Emotionally Abused: Not on file  . Physically Abused: Not on file  . Sexually Abused: Not on file    Family History:   The patient's family history includes Bladder Cancer in his  brother; Brain cancer in his sister; Breast cancer in his sister; Cancer (age of onset: 48) in his mother; Cancer - Other in his brother; Colon cancer in his mother; Heart attack in his father; Heart disease in his brother, brother, and father.    ROS:  Please see the history of present illness.  All other ROS reviewed and negative.     Physical Exam/Data:   Vitals:   11/14/19 1139 11/14/19 1144 11/14/19 1146 11/14/19 1151  BP: (!) 172/80 (!) 167/82 (!) 172/83 (!) 174/85  Pulse: 61 (!) 59 (!) 59 63  Resp: 12 14 19 13   Temp:      TempSrc:      SpO2: 96% 98% 97% 97%  Weight:      Height:       No intake or output data in the 24 hours ending 11/14/19 1216 Last 3 Weights 11/14/2019 11/07/2019 11/02/2019  Weight (lbs) 230 lb 243 lb 239 lb  Weight (kg) 104.327 kg 110.224 kg 108.41 kg     Body mass index is 37.12 kg/m.  General:  Well nourished, well developed, in no acute distress HEENT: normal Lymph: no adenopathy Neck: no JVD Endocrine:  No thryomegaly Vascular: No carotid bruits; FA pulses 2+ bilaterally without bruits  Cardiac:  RRR, systolic murmur Lungs:  clear to auscultation bilaterally, no wheezing, rhonchi or rales  Abd: soft, nontender, no hepatomegaly  Ext: no LE  edema Musculoskeletal:  No deformities, BUE and BLE strength normal and equal Skin: warm and dry  Neuro:  CNs 2-12 intact, no focal abnormalities noted Psych:  Normal affect   Relevant CV Studies: Cardiac cath 11/14/19:  Prox RCA-1 lesion is 70% stenosed.  Prox RCA-2 lesion is 95% stenosed.  LPAV lesion is 90% stenosed.  Lat 3rd Mrg lesion is 80% stenosed.  Prox Cx to Mid Cx lesion is 30% stenosed.  Prox LAD to Mid LAD lesion is 99% stenosed.  Mid LAD lesion is 99% stenosed.  2nd Diag lesion is 90% stenosed.   1. Severe proximal LAD stenosis, calcified. Severe mid LAD stenosis at the takeoff of the moderate caliber diagonal branch, calcified.  2. Severe distal AV groove stenosis in the left  dominant Circumflex. Severe OM 2 stenosis.  3. Moderate caliber non-dominant RCA with patent proximal stent with moderately severe restenosis. Severe mid RCA stenosis.  4. Moderately severe to severe aortic stenosis (mean gradient 26.6 mmHg, peak to peak gradient 31 mmHg, AVA 1.06 cm2).   Recommendations: He has moderately severe to severe AS and three vessel CAD. I will admit him to telemetry. Given elevated pressures, will begin IV Lasix. I will ask our CT surgery team to see him to discuss CABG/surgical AVR vs staged PCI and TAVR. Will obtain pre-TAVR CT scans to fully assess his aorta and access.   In regards to bypass, the RCA is non-dominant. I think he would benefit from bypass of the LAD and potentially the OM branch and left sided PDA. If he is not felt to be a candidate for CABG, we would plan staged PCI of the LAD which would require atherectomy prior to stent placement. The LAD is calcified. The proximal LAD stenosis could be easily treated with orbital atherectomy and stenting. The mid LAD lesion is at the takeoff of a moderate caliber diagonal branch which also has a severe ostial stenosis. This would be higher risk in regards to potentially losing flow down that diagonal branch post stenting of the LAD.   He has mild memory issues but is overall functional at baseline, recently limited by dyspnea with exertion. He may be a candidate for open surgery with AVR and bypass.   Diagnostic Dominance: Left Left Anterior Descending  Vessel is large.  Prox LAD to Mid LAD lesion is 99% stenosed. The lesion is calcified.  Mid LAD lesion is 99% stenosed. The lesion is calcified.  Second Diagonal Branch  Vessel is moderate in size.  2nd Diag lesion is 90% stenosed.  Left Circumflex  Vessel is large.  Prox Cx to Mid Cx lesion is 30% stenosed.  Lateral Third Obtuse Marginal Branch  Lat 3rd Mrg lesion is 80% stenosed.  Left Posterior Atrioventricular Artery  LPAV lesion is 90% stenosed.    Right Coronary Artery  Vessel is moderate in size.  Prox RCA-1 lesion is 70% stenosed. The lesion was previously treated using a bare metal stent over 2 years ago.  Prox RCA-2 lesion is 95% stenosed.  Intervention  No interventions have been documented. Coronary Diagrams  Diagnostic Dominance: Left  Intervention  Implants   Vascular Products  Closure Mynx Control 37f - [id number] - Implanted Inventory item: CLOSURE [facility] CONTROL 85F Model/Cat number: [id number]  Manufacturer: [place] Lot number: [id number]  Device identifier: [id number] Device identifier type: GS1  GUDID Information  Request status Successful    Brand name: MYNX CONTROL Version/Model: [id number]  Company name: [place]  as of 11/14/19: MR Safe  Contains dry or latex rubber: No    GMDN P.T. name: Wound hydrogel dressing, non-antimicrobial    As of 11/14/2019  Status: Implanted      Syngo Images  Show images for CARDIAC CATHETERIZATION Images on Long Term Storage  Show images for Shean, Gerding R "Ronnie" Link to Procedure Log  Procedure Log    Hemo Data   Most Recent Value  Fick Cardiac Output 4.62 L/min  Fick Cardiac Output Index 2.18 (L/min)/BSA  Aortic Mean Gradient 26.6 mmHg  Aortic Peak Gradient 31 mmHg  Aortic Valve Area 1.06  Aortic Value Area Index 0.5 cm2/BSA  RA A Wave 16 mmHg  RA V Wave 14 mmHg  RA Mean 13 mmHg  RV Systolic Pressure 44 mmHg  RV Diastolic Pressure 11 mmHg  RV EDP 16 mmHg  PA Systolic Pressure 51 mmHg  PA Diastolic Pressure 13 mmHg  PA Mean 29 mmHg  PW A Wave 23 mmHg  PW V Wave 22 mmHg  PW Mean 19 mmHg  AO Systolic Pressure 542 mmHg  AO Diastolic Pressure 75 mmHg  AO Mean 706 mmHg  LV Systolic Pressure 237 mmHg  LV Diastolic Pressure 13 mmHg  LV EDP 22 mmHg  AOp Systolic Pressure 628 mmHg  AOp Diastolic Pressure 73 mmHg  AOp Mean Pressure 315 mmHg  LVp Systolic Pressure 176 mmHg  LVp Diastolic Pressure 7 mmHg  LVp  EDP Pressure 18 mmHg  QP/QS 1  TPVR Index 14.22 HRUI  TSVR Index 51.39 HRUI  PVR SVR Ratio 0.12  TPVR/TSVR Ratio 0.28   Echo 10/31/19:  1. Left ventricular ejection fraction, by estimation, is 60 to 65%. The  left ventricle has normal function. The left ventricle has no regional  wall motion abnormalities. There is mild left ventricular hypertrophy.  Left ventricular diastolic parameters  are indeterminate.  2. Right ventricular systolic function is normal. The right ventricular  size is normal. Tricuspid regurgitation signal is inadequate for assessing  PA pressure.  3. The mitral valve is grossly normal with mild annular calcification.  Trivial mitral valve regurgitation.  4. The aortic valve is tricuspid. There is moderate calcification of the  aortic valve. Aortic valve regurgitation is not visualized. Moderate to  severe aortic valve stenosis. Aortic valve mean gradient measures 23.2  mmHg. Aortic valve Vmax measures 3.21  m/s. Dimentionless index 0.28.  5. Aortic dilatation noted. There is borderline dilatation of the  ascending aorta, measuring 40 mm.  6. Unable to estimate CVP.   FINDINGS  Left Ventricle: Left ventricular ejection fraction, by estimation, is 60  to 65%. The left ventricle has normal function. The left ventricle has no  regional wall motion abnormalities. The left ventricular internal cavity  size was normal in size. There is  mild left ventricular hypertrophy. Left ventricular diastolic parameters  are indeterminate.   Right Ventricle: The right ventricular size is normal. No increase in  right ventricular wall thickness. Right ventricular systolic function is  normal. Tricuspid regurgitation signal is inadequate for assessing PA  pressure.   Left Atrium: Left atrial size was normal in size.   Right Atrium: Right atrial size was normal in size.   Pericardium: There is no evidence of pericardial effusion.   Mitral Valve: The mitral valve  is grossly normal. Mild mitral annular  calcification. Trivial mitral valve regurgitation.   Tricuspid Valve: The tricuspid valve is grossly normal. Tricuspid valve  regurgitation is mild.   Aortic Valve: The aortic valve is  tricuspid. There is moderate  calcification of the aortic valve. There is moderate aortic valve annular  calcification. Aortic valve regurgitation is not visualized. Moderate to  severe aortic stenosis is present. Aortic  valve mean gradient measures 23.2 mmHg. Aortic valve peak gradient  measures 41.2 mmHg. Aortic valve area, by VTI measures 0.98 cm.   Pulmonic Valve: The pulmonic valve was grossly normal. Pulmonic valve  regurgitation is trivial.   Aorta: Aortic dilatation noted. There is borderline dilatation of the  ascending aorta, measuring 40 mm.   Venous: Unable to estimate CVP. IVC assessment for right atrial pressure  unable to be performed due to mechanical ventilation.   IAS/Shunts: The interatrial septum was not well visualized.     LEFT VENTRICLE  PLAX 2D  LVIDd:     5.37 cm   Diastology  LVIDs:     3.11 cm   LV e' medial:  6.87 cm/s  LV PW:     1.20 cm   LV E/e' medial: 10.9  LV IVS:    1.16 cm   LV e' lateral:  5.71 cm/s  LVOT diam:   2.10 cm   LV E/e' lateral: 13.2  LV SV:     56  LV SV Index:  26  LVOT Area:   3.46 cm    LV Volumes (MOD)  LV vol d, MOD A2C: 93.9 ml  LV vol d, MOD A4C: 120.0 ml  LV vol s, MOD A2C: 37.0 ml  LV vol s, MOD A4C: 50.0 ml  LV SV MOD A2C:   56.9 ml  LV SV MOD A4C:   120.0 ml  LV SV MOD BP:   64.7 ml   RIGHT VENTRICLE  RV S prime:   17.40 cm/s  TAPSE (M-mode): 2.3 cm   LEFT ATRIUM       Index  LA diam:    3.00 cm 1.39 cm/m  LA Vol (A2C):  41.9 ml 19.46 ml/m  LA Vol (A4C):  60.9 ml 28.28 ml/m  LA Biplane Vol:     23.90 ml/m  AORTIC VALVE  AV Area (Vmax):  1.01 cm  AV Area (Vmean):  1.03 cm  AV Area (VTI):   0.98  cm  AV Vmax:      320.80 cm/s  AV Vmean:     221.600 cm/s  AV VTI:      0.578 m  AV Peak Grad:   41.2 mmHg  AV Mean Grad:   23.2 mmHg  LVOT Vmax:     93.80 cm/s  LVOT Vmean:    66.200 cm/s  LVOT VTI:     0.163 m  LVOT/AV VTI ratio: 0.28    AORTA  Ao Root diam: 3.70 cm  Ao Asc diam: 4.00 cm   MITRAL VALVE        TRICUSPID VALVE  MV Area (PHT):       TR Peak grad:  7.0 mmHg  MV Decel Time: 209 msec  TR Vmax:    132.00 cm/s  MR Peak grad: 10.1 mmHg  MR Vmax:   159.00 cm/s SHUNTS  MV E velocity: 75.20 cm/s Systemic VTI: 0.16 m  MV A velocity: 85.90 cm/s Systemic Diam: 2.10 cm  MV E/A ratio: 0.88   Laboratory Data:  High Sensitivity Troponin:  No results for input(s): TROPONINIHS in the last 720 hours.    ChemistryNo results for input(s): NA, K, CL, CO2, GLUCOSE, BUN, CREATININE, CALCIUM, GFRNONAA, GFRAA, ANIONGAP in the last 168 hours.  No results for  input(s): PROT, ALBUMIN, AST, ALT, ALKPHOS, BILITOT in the last 168 hours. HematologyNo results for input(s): WBC, RBC, HGB, HCT, MCV, MCH, MCHC, RDW, PLT in the last 168 hours. BNPNo results for input(s): BNP, PROBNP in the last 168 hours.  DDimer No results for input(s): DDIMER in the last 168 hours.   Radiology/Studies:  CARDIAC CATHETERIZATION  Result Date: 11/14/2019  Prox RCA-1 lesion is 70% stenosed.  Prox RCA-2 lesion is 95% stenosed.  LPAV lesion is 90% stenosed.  Lat 3rd Mrg lesion is 80% stenosed.  Prox Cx to Mid Cx lesion is 30% stenosed.  Prox LAD to Mid LAD lesion is 99% stenosed.  Mid LAD lesion is 99% stenosed.  2nd Diag lesion is 90% stenosed.  1. Severe proximal LAD stenosis, calcified. Severe mid LAD stenosis at the takeoff of the moderate caliber diagonal branch, calcified. 2. Severe distal AV groove stenosis in the left dominant Circumflex. Severe OM 2 stenosis. 3. Moderate caliber non-dominant RCA with patent proximal stent with moderately  severe restenosis. Severe mid RCA stenosis. 4. Moderately severe to severe aortic stenosis (mean gradient 26.6 mmHg, peak to peak gradient 31 mmHg, AVA 1.06 cm2). Recommendations: He has moderately severe to severe AS and three vessel CAD. I will admit him to telemetry. Given elevated pressures, will begin IV Lasix. I will ask our CT surgery team to see him to discuss CABG/surgical AVR vs staged PCI and TAVR. Will obtain pre-TAVR CT scans to fully assess his aorta and access. In regards to bypass, the RCA is non-dominant. I think he would benefit from bypass of the LAD and potentially the OM branch and left sided PDA. If he is not felt to be a candidate for CABG, we would plan staged PCI of the LAD which would require atherectomy prior to stent placement. The LAD is calcified. The proximal LAD stenosis could be easily treated with orbital atherectomy and stenting. The mid LAD lesion is at the takeoff of a moderate caliber diagonal branch which also has a severe ostial stenosis. This would be higher risk in regards to potentially losing flow down that diagonal branch post stenting of the LAD. He has mild memory issues but is overall functional at baseline, recently limited by dyspnea with exertion. He may be a candidate for open surgery with AVR and bypass.     Assessment and Plan:   1. Severe aortic stenosis 2. Multi-vessel CAD with unstable angina  He has moderately severe to severe AS and three vessel CAD. I will admit him to telemetry. Given elevated pressures, will begin IV Lasix. I will ask our CT surgery team to see him to discuss CABG/surgical AVR vs staged PCI and TAVR. Will obtain pre-TAVR CT scans to fully assess his aorta and access.   In regards to bypass, the RCA is non-dominant. I think he would benefit from bypass of the LAD and potentially the OM branch and left sided PDA. If he is not felt to be a candidate for CABG, we would plan staged PCI of the LAD which would require atherectomy  prior to stent placement. The LAD is calcified. The proximal LAD stenosis could be easily treated with orbital atherectomy and stenting. The mid LAD lesion is at the takeoff of a moderate caliber diagonal branch which also has a severe ostial stenosis. This would be higher risk in regards to potentially losing flow down that diagonal branch post stenting of the LAD.   He has mild memory issues but is overall functional at baseline, recently  limited by dyspnea with exertion. He may be a candidate for open surgery with AVR and bypass.    For questions or updates, please contact [place] Please consult [email or web address] for contact info under     Signed, [name], MD  11/14/2019 12:16 PM

## 2019-11-15 ENCOUNTER — Inpatient Hospital Stay (HOSPITAL_COMMUNITY): Payer: Medicare Other

## 2019-11-15 DIAGNOSIS — I350 Nonrheumatic aortic (valve) stenosis: Secondary | ICD-10-CM

## 2019-11-15 LAB — PULMONARY FUNCTION TEST
FEF 25-75 Pre: 1.14 L/sec
FEF2575-%Pred-Pre: 68 %
FEV1-%Pred-Pre: 60 %
FEV1-Pre: 1.47 L
FEV1FVC-%Pred-Pre: 104 %
FEV6-%Pred-Pre: 61 %
FEV6-Pre: 1.94 L
FEV6FVC-%Pred-Pre: 107 %
FVC-%Pred-Pre: 57 %
FVC-Pre: 1.95 L
Pre FEV1/FVC ratio: 75 %
Pre FEV6/FVC Ratio: 100 %

## 2019-11-15 LAB — BASIC METABOLIC PANEL
Anion gap: 11 (ref 5–15)
BUN: 16 mg/dL (ref 8–23)
CO2: 24 mmol/L (ref 22–32)
Calcium: 8.8 mg/dL — ABNORMAL LOW (ref 8.9–10.3)
Chloride: 100 mmol/L (ref 98–111)
Creatinine, Ser: 1.18 mg/dL (ref 0.61–1.24)
GFR, Estimated: >60 mL/min (ref >60)
Glucose, Bld: 256 mg/dL — ABNORMAL HIGH (ref 70–99)
Potassium: 3.7 mmol/L (ref 3.5–5.1)
Sodium: 135 mmol/L (ref 135–145)

## 2019-11-15 LAB — GLUCOSE, CAPILLARY
Glucose-Capillary: 244 mg/dL — ABNORMAL HIGH (ref 70–99)
Glucose-Capillary: 233 mg/dL — ABNORMAL HIGH (ref 70–99)
Glucose-Capillary: 313 mg/dL — ABNORMAL HIGH (ref 70–99)
Glucose-Capillary: 280 mg/dL — ABNORMAL HIGH (ref 70–99)

## 2019-11-15 MED ORDER — LORATADINE 10 MG PO TABS
10.0000 mg | ORAL_TABLET | Freq: Every day | ORAL | Status: DC
  Administered 2019-11-16: 10 mg via ORAL
  Filled 2019-11-15: qty 1

## 2019-11-15 MED ORDER — DOCUSATE SODIUM 100 MG PO CAPS
100.0000 mg | ORAL_CAPSULE | Freq: Two times a day (BID) | ORAL | Status: DC
  Administered 2019-11-15 – 2019-11-16 (×3): 100 mg via ORAL
  Filled 2019-11-15 (×3): qty 1

## 2019-11-15 MED ORDER — ATORVASTATIN CALCIUM 80 MG PO TABS
80.0000 mg | ORAL_TABLET | Freq: Every day | ORAL | Status: DC
  Administered 2019-11-15: 80 mg via ORAL
  Filled 2019-11-15: qty 1

## 2019-11-15 MED ORDER — ASPIRIN EC 81 MG PO TBEC
81.0000 mg | DELAYED_RELEASE_TABLET | Freq: Every day | ORAL | Status: DC
  Administered 2019-11-15 – 2019-11-16 (×2): 81 mg via ORAL
  Filled 2019-11-15 (×2): qty 1

## 2019-11-15 MED ORDER — FAMOTIDINE 20 MG PO TABS
20.0000 mg | ORAL_TABLET | Freq: Two times a day (BID) | ORAL | Status: DC
  Administered 2019-11-15 – 2019-11-16 (×3): 20 mg via ORAL
  Filled 2019-11-15 (×3): qty 1

## 2019-11-15 MED ORDER — ISOSORBIDE MONONITRATE ER 30 MG PO TB24
30.0000 mg | ORAL_TABLET | Freq: Every day | ORAL | Status: DC
  Administered 2019-11-15 – 2019-11-16 (×2): 30 mg via ORAL
  Filled 2019-11-15 (×2): qty 1

## 2019-11-15 MED ORDER — INSULIN ASPART 100 UNIT/ML ~~LOC~~ SOLN
15.0000 [IU] | Freq: Three times a day (TID) | SUBCUTANEOUS | Status: DC
  Administered 2019-11-15 – 2019-11-16 (×5): 20 [IU] via SUBCUTANEOUS

## 2019-11-15 MED ORDER — PAROXETINE HCL 20 MG PO TABS
20.0000 mg | ORAL_TABLET | Freq: Every day | ORAL | Status: DC
  Administered 2019-11-15 – 2019-11-16 (×2): 20 mg via ORAL
  Filled 2019-11-15 (×2): qty 1

## 2019-11-15 MED ORDER — IOHEXOL 350 MG/ML SOLN
100.0000 mL | Freq: Once | INTRAVENOUS | Status: AC | PRN
  Administered 2019-11-15: 100 mL via INTRAVENOUS

## 2019-11-15 MED ORDER — ATORVASTATIN CALCIUM 10 MG PO TABS: 20.0000 mg | ORAL_TABLET | Freq: Every day | ORAL | Status: DC

## 2019-11-15 NOTE — Progress Notes (Signed)
Interventional Radiology Brief Note:  PA called to CT for contrast extravasation. Patient found to have a large area of infiltration extending from the [facility] medially and superiorly to the mid bicep.  It is well-circumscribed but appears to be softening.  Ice pack in place.  ROM intact.  Sensation intact.  No numbness. Hand grip strength intact. No skin breakdown.  IV will need to be removed.  Patient very upset.  States the IV was difficult to obtain and required multiple attempts. Reports "I will never do this again."  Patient given care instructions.  PA also called to unit to give instructions to RN.  Patient may use ice or heat alternatively. Elevate arm above the level of the heart.   IR to follow-up with patient tomorrow.   Brynda Greathouse, MS RD PA-C 1:28 PM

## 2019-11-15 NOTE — Progress Notes (Signed)
Nutrition Education Note  RD consulted for nutrition education regarding CHF and diabetes.  Lab Results  Component Value Date   HGBA1C 7.6 (H) 11/14/2019   PTA DM medications are 1000 mg metformin BID, 15-20 units insulin aspart TID with meals, and 56 units insulin glargine daily at bedtime.   Labs reviewed: CBGS: 233-244 (inpatient orders for glycemic control are 15-20 units insulin aspart TID with meals and 56 units insulin glargine daily at bedtime).   Pt deferred conversation to pt wife at bedside. Per wife, pt doing well and ate most of his lunch. Visit cur short due to MD needing to assess pt.   RD provided "Heart Healthy, Consistent Carbohydrate Nutrition Therapy" handout from the Academy of [employer]. Handout attached to AVS/ discharge instruction.   Body mass index is 37.45 kg/m. Pt meets criteria for obesity, class II based on current BMI. Obesity is a complex, chronic medical condition that is optimally managed by a multidisciplinary care team. Weight loss is not an ideal goal for an acute inpatient hospitalization. However, if further work-up for obesity is warranted, consider outpatient referral to outpatient bariatric service and/or 's Nutrition and Diabetes Education Services.   Current diet order is carb modified, patient is consuming approximately 75% of meals at this time. Labs and medications reviewed. No further nutrition interventions warranted at this time. RD contact information provided. If additional nutrition issues arise, please re-consult RD.   Loistine Chance, RD, LDN, [place] Registered Dietitian II Certified Diabetes Care and Education Specialist Please refer to [facility] for RD and/or RD on-call/weekend/after hours pager

## 2019-11-15 NOTE — Plan of Care (Signed)
  Problem: Activity: Goal: Risk for activity intolerance will decrease Outcome: Progressing   Problem: Safety: Goal: Ability to remain free from injury will improve Outcome: Progressing   

## 2019-11-15 NOTE — Progress Notes (Addendum)
Progress Note  Patient Name: [name] Date of Encounter: 11/15/2019  Primary Cardiologist: Rozann Lesches, MD   Subjective   Patient resting flat in bed, stating that his chest pain has improved. He admits to being very anxious to speak with the CT surgeons regarding the risks and benefits of surgical valve replacement with CABG vs TAVR with stenting.   Inpatient Medications    Scheduled Meds: . acetaminophen  1,000 mg Oral Q8H  . amLODipine  5 mg Oral Daily  . aspirin EC  81 mg Oral Daily  . atorvastatin  80 mg Oral q1800  . docusate sodium  100 mg Oral BID WC  . donepezil  5 mg Oral QHS  . famotidine  20 mg Oral BID WC  . furosemide  40 mg Intravenous Q12H  . insulin aspart  15-20 Units Subcutaneous TID WC  . insulin glargine  56 Units Subcutaneous QHS  . isosorbide mononitrate  30 mg Oral Daily  . [START ON 11/16/2019] loratadine  10 mg Oral Q supper  . PARoxetine  20 mg Oral Daily  . sodium chloride flush  3 mL Intravenous Q12H  . tamsulosin  0.4 mg Oral Q supper   Continuous Infusions: . sodium chloride     PRN Meds: sodium chloride, hydrocortisone, nitroGLYCERIN, ondansetron (ZOFRAN) IV, sodium chloride flush   Vital Signs    Vitals:   11/15/19 0700 11/15/19 0813 11/15/19 1000 11/15/19 1100  BP:  134/70    Pulse: 73 73 (!) 58 (!) 57  Resp: (!) 21 20 19  (!) 23  Temp:  97.6 F (36.4 C)    TempSrc:  Oral    SpO2: 93% 92% 96% 92%  Weight:      Height:        Intake/Output Summary (Last 24 hours) at 11/15/2019 1347 Last data filed at 11/15/2019 0840 Gross per 24 hour  Intake 1181.12 ml  Output 2270 ml  Net -1088.88 ml   Filed Weights   11/14/19 0812 11/15/19 0339  Weight: 104.3 kg 105.2 kg    Telemetry    sinus - Personally Reviewed  ECG    No ekg today - Personally Reviewed  Physical Exam   GEN: No acute distress.   Neck: No JVD Cardiac: RRR, 3/6 harsh late peaking crescendo decrescendo murmur at the right upper sternal border,  A2 present. Respiratory: Clear to auscultation bilaterally. GI: Soft, nontender, non-distended  MS: No edema; No deformity.  Right groin site clear Neuro:  Nonfocal  Psych: Normal affect   Labs    Chemistry Recent Labs  Lab 11/14/19 1103 11/14/19 1108 11/15/19 1122  NA 141 140 135  K 3.8 3.8 3.7  CL  --   --  100  CO2  --   --  24  GLUCOSE  --   --  256*  BUN  --   --  16  CREATININE  --   --  1.18  CALCIUM  --   --  8.8*  GFRNONAA  --   --  >60  ANIONGAP  --   --  11     Hematology Recent Labs  Lab 11/14/19 1103 11/14/19 1108  HGB 12.9* 12.6*  HCT 38.0* 37.0*    Cardiac EnzymesNo results for input(s): TROPONINI in the last 168 hours. No results for input(s): TROPIPOC in the last 168 hours.   BNPNo results for input(s): BNP, PROBNP in the last 168 hours.   DDimer No results for input(s): DDIMER in the last  168 hours.   Radiology    CARDIAC CATHETERIZATION  Result Date: 11/14/2019  Prox RCA-1 lesion is 70% stenosed.  Prox RCA-2 lesion is 95% stenosed.  LPAV lesion is 90% stenosed.  Lat 3rd Mrg lesion is 80% stenosed.  Prox Cx to Mid Cx lesion is 30% stenosed.  Prox LAD to Mid LAD lesion is 99% stenosed.  Mid LAD lesion is 99% stenosed.  2nd Diag lesion is 90% stenosed.  1. Severe proximal LAD stenosis, calcified. Severe mid LAD stenosis at the takeoff of the moderate caliber diagonal branch, calcified. 2. Severe distal AV groove stenosis in the left dominant Circumflex. Severe OM 2 stenosis. 3. Moderate caliber non-dominant RCA with patent proximal stent with moderately severe restenosis. Severe mid RCA stenosis. 4. Moderately severe to severe aortic stenosis (mean gradient 26.6 mmHg, peak to peak gradient 31 mmHg, AVA 1.06 cm2). Recommendations: He has moderately severe to severe AS and three vessel CAD. I will admit him to telemetry. Given elevated pressures, will begin IV Lasix. I will ask our CT surgery team to see him to discuss CABG/surgical AVR vs staged  PCI and TAVR. Will obtain pre-TAVR CT scans to fully assess his aorta and access. In regards to bypass, the RCA is non-dominant. I think he would benefit from bypass of the LAD and potentially the OM branch and left sided PDA. If he is not felt to be a candidate for CABG, we would plan staged PCI of the LAD which would require atherectomy prior to stent placement. The LAD is calcified. The proximal LAD stenosis could be easily treated with orbital atherectomy and stenting. The mid LAD lesion is at the takeoff of a moderate caliber diagonal branch which also has a severe ostial stenosis. This would be higher risk in regards to potentially losing flow down that diagonal branch post stenting of the LAD. He has mild memory issues but is overall functional at baseline, recently limited by dyspnea with exertion. He may be a candidate for open surgery with AVR and bypass.    Cardiac Studies  Left/Right Heart cath:  Prox RCA-1 lesion is 70% stenosed.  Prox RCA-2 lesion is 95% stenosed.  LPAV lesion is 90% stenosed.  Lat 3rd Mrg lesion is 80% stenosed.  Prox Cx to Mid Cx lesion is 30% stenosed.  Prox LAD to Mid LAD lesion is 99% stenosed.  Mid LAD lesion is 99% stenosed.  2nd Diag lesion is 90% stenosed.  1. Severe proximal LAD stenosis, calcified. Severe mid LAD stenosis at the takeoff of the moderate caliber diagonal branch, calcified.  2. Severe distal AV groove stenosis in the left dominant Circumflex. Severe OM 2 stenosis.  3. Moderate caliber non-dominant RCA with patent proximal stent with moderately severe restenosis. Severe mid RCA stenosis.  4. Moderately severe to severe aortic stenosis (mean gradient 26.6 mmHg, peak to peak gradient 31 mmHg, AVA 1.06 cm2).   Recommendations: He has moderately severe to severe AS and three vessel CAD. I will admit him to telemetry. Given elevated pressures, will begin IV Lasix. I will ask our CT surgery team to see him to discuss CABG/surgical AVR vs  staged PCI and TAVR. Will obtain pre-TAVR CT scans to fully assess his aorta and access.   In regards to bypass, the RCA is non-dominant. I think he would benefit from bypass of the LAD and potentially the OM branch and left sided PDA. If he is not felt to be a candidate for CABG, we would plan staged PCI of the  LAD which would require atherectomy prior to stent placement. The LAD is calcified. The proximal LAD stenosis could be easily treated with orbital atherectomy and stenting. The mid LAD lesion is at the takeoff of a moderate caliber diagonal branch which also has a severe ostial stenosis. This would be higher risk in regards to potentially losing flow down that diagonal branch post stenting of the LAD.   He has mild memory issues but is overall functional at baseline, recently limited by dyspnea with exertion. He may be a candidate for open surgery with AVR and bypass.    Echocardiogram (10/31/19) 1. Left ventricular ejection fraction, by estimation, is 60 to 65%. The  left ventricle has normal function. The left ventricle has no regional  wall motion abnormalities. There is mild left ventricular hypertrophy.  Left ventricular diastolic parameters  are indeterminate.  2. Right ventricular systolic function is normal. The right ventricular  size is normal. Tricuspid regurgitation signal is inadequate for assessing  PA pressure.  3. The mitral valve is grossly normal with mild annular calcification.  Trivial mitral valve regurgitation.  4. The aortic valve is tricuspid. There is moderate calcification of the  aortic valve. Aortic valve regurgitation is not visualized. Moderate to  severe aortic valve stenosis. Aortic valve mean gradient measures 23.2  mmHg. Aortic valve Vmax measures 3.21  m/s. Dimentionless index 0.28.  5. Aortic dilatation noted. There is borderline dilatation of the  ascending aorta, measuring 40 mm.  6. Unable to estimate CVP.   Patient Profile     79  y.o. male HTN, GERD, HLD, OSA (CPAP), IDDM, and CAD s/p BMS to RCA (2006), who presents for further evaluation and management of significant multivessel CAD and severe aortic steonis  Assessment & Plan    Multivessel CAD with unstable angina: Patient is s/p left heart cath showing significant multivessel disease outlined above. CT surgery was consult to see if patient is a candidate for CABG with surgical valve replace. If patient is not a candidate for surgery, than he will need staged PCI with TAVR. Plan for CT scan of coronary arteries today.   Moderate to Severe AS: Patient was found to have severe aortic stenosis with a mean gradient of 23.3 mm HG and a peak gradient of 41.2 mm Hg. Patient is symptomatic from his AS with dyspnea with exertion, but denies syncopal events. He  is getting worked up for surgical aortic valve replacement vs TAVR. CT scans pending.  HLD: - Continue Atorvastatin 80 mg - Continue ASA 81 mg   HTN: - Patient is normotensive on amlodipine 5 mg daily and imdur 30 mg daily.   For questions or updates, please contact [place] Please consult [email or web address] for contact info under Cardiology/STEMI.      Signed, Marianna Payment, MD  11/15/2019, 1:47 PM    Patient seen, examined. Available data reviewed. Agree with findings, assessment, and plan as outlined by Dr Marianna Payment.  The patient is independently interviewed and examined.  Agree with assessment and plan of Dr. Marianna Payment as outlined.  Please see my separate note from today.  Sherren Mocha, M.D. 11/15/2019 2:09 PM

## 2019-11-15 NOTE — Progress Notes (Addendum)
[place] VALVE TEAM  Patient Name: [name] Date of Encounter: 11/15/2019  Primary Cardiologist: Dr. Jesse Sans Problem List     Active Problems:   Unstable angina [facility])   Nonrheumatic aortic valve stenosis   Subjective   No complaints. Laying in bed. Wife at bedside. They have a lot of questions about next steps.   Inpatient Medications    Scheduled Meds:  acetaminophen  1,000 mg Oral Q8H   amLODipine  5 mg Oral Daily   aspirin EC  81 mg Oral Daily   atorvastatin  80 mg Oral q1800   docusate sodium  100 mg Oral BID WC   donepezil  5 mg Oral QHS   famotidine  20 mg Oral BID WC   furosemide  40 mg Intravenous Q12H   insulin aspart  15-20 Units Subcutaneous TID WC   insulin glargine  56 Units Subcutaneous QHS   isosorbide mononitrate  30 mg Oral Daily   [START ON 11/16/2019] loratadine  10 mg Oral Q supper   PARoxetine  20 mg Oral Daily   sodium chloride flush  3 mL Intravenous Q12H   tamsulosin  0.4 mg Oral Q supper   Continuous Infusions:  sodium chloride     PRN Meds: sodium chloride, hydrocortisone, nitroGLYCERIN, ondansetron (ZOFRAN) IV, sodium chloride flush   Vital Signs    Vitals:   11/15/19 0700 11/15/19 0813 11/15/19 1000 11/15/19 1100  BP:  134/70    Pulse: 73 73 (!) 58 (!) 57  Resp: (!) 21 20 19  (!) 23  Temp:  97.6 F (36.4 C)    TempSrc:  Oral    SpO2: 93% 92% 96% 92%  Weight:      Height:        Intake/Output Summary (Last 24 hours) at 11/15/2019 1155 Last data filed at 11/15/2019 0840 Gross per 24 hour  Intake 1181.12 ml  Output 2270 ml  Net -1088.88 ml   Filed Weights   11/14/19 0812 11/15/19 0339  Weight: 104.3 kg 105.2 kg    Physical Exam   GEN: Well nourished, well developed, in no acute distress.  HEENT: Grossly normal.  Neck: Supple, no JVD, carotid bruits, or masses. Cardiac: RRR, loud, harsh SEM.  No rubs, or gallops. No clubbing, cyanosis, edema.    Respiratory:  Respirations regular and unlabored, clear to auscultation bilaterally. GI: Soft, nontender, nondistended, BS + x 4. Obese abdomen  MS: no deformity or atrophy. Skin: warm and dry, no rash. Neuro:  Strength and sensation are intact. Psych: AAOx3.  Normal affect.  Labs    CBC Recent Labs    11/14/19 1103 11/14/19 1108  HGB 12.9* 12.6*  HCT 38.0* 84.6*   Basic Metabolic Panel Recent Labs    11/14/19 1103 11/14/19 1108  NA 141 140  K 3.8 3.8   Liver Function Tests No results for input(s): AST, ALT, ALKPHOS, BILITOT, PROT, ALBUMIN in the last 72 hours. No results for input(s): LIPASE, AMYLASE in the last 72 hours. Cardiac Enzymes No results for input(s): CKTOTAL, CKMB, CKMBINDEX, TROPONINI in the last 72 hours. BNP Invalid input(s): POCBNP D-Dimer No results for input(s): DDIMER in the last 72 hours. Hemoglobin A1C Recent Labs    11/14/19 1557  HGBA1C 7.6*   Fasting Lipid Panel No results for input(s): CHOL, HDL, LDLCALC, TRIG, CHOLHDL, LDLDIRECT in the last 72 hours. Thyroid Function Tests No results for input(s): TSH, T4TOTAL, T3FREE, THYROIDAB in the last 72 hours.  Invalid input(s): FREET3  Telemetry    Sinus  - Personally Reviewed  ECG    Sinus brady - Personally Reviewed  Radiology    CARDIAC CATHETERIZATION  Result Date: 11/14/2019  Prox RCA-1 lesion is 70% stenosed.  Prox RCA-2 lesion is 95% stenosed.  LPAV lesion is 90% stenosed.  Lat 3rd Mrg lesion is 80% stenosed.  Prox Cx to Mid Cx lesion is 30% stenosed.  Prox LAD to Mid LAD lesion is 99% stenosed.  Mid LAD lesion is 99% stenosed.  2nd Diag lesion is 90% stenosed.  1. Severe proximal LAD stenosis, calcified. Severe mid LAD stenosis at the takeoff of the moderate caliber diagonal branch, calcified. 2. Severe distal AV groove stenosis in the left dominant Circumflex. Severe OM 2 stenosis. 3. Moderate caliber non-dominant RCA with patent proximal stent with moderately severe  restenosis. Severe mid RCA stenosis. 4. Moderately severe to severe aortic stenosis (mean gradient 26.6 mmHg, peak to peak gradient 31 mmHg, AVA 1.06 cm2). Recommendations: He has moderately severe to severe AS and three vessel CAD. I will admit him to telemetry. Given elevated pressures, will begin IV Lasix. I will ask our CT surgery team to see him to discuss CABG/surgical AVR vs staged PCI and TAVR. Will obtain pre-TAVR CT scans to fully assess his aorta and access. In regards to bypass, the RCA is non-dominant. I think he would benefit from bypass of the LAD and potentially the OM branch and left sided PDA. If he is not felt to be a candidate for CABG, we would plan staged PCI of the LAD which would require atherectomy prior to stent placement. The LAD is calcified. The proximal LAD stenosis could be easily treated with orbital atherectomy and stenting. The mid LAD lesion is at the takeoff of a moderate caliber diagonal branch which also has a severe ostial stenosis. This would be higher risk in regards to potentially losing flow down that diagonal branch post stenting of the LAD. He has mild memory issues but is overall functional at baseline, recently limited by dyspnea with exertion. He may be a candidate for open surgery with AVR and bypass.    Cardiac Studies   11/14/19 RIGHT/LEFT HEART CATH AND CORONARY ANGIOGRAPHY  Conclusion    Prox RCA-1 lesion is 70% stenosed.  Prox RCA-2 lesion is 95% stenosed.  LPAV lesion is 90% stenosed.  Lat 3rd Mrg lesion is 80% stenosed.  Prox Cx to Mid Cx lesion is 30% stenosed.  Prox LAD to Mid LAD lesion is 99% stenosed.  Mid LAD lesion is 99% stenosed.  2nd Diag lesion is 90% stenosed.   1. Severe proximal LAD stenosis, calcified. Severe mid LAD stenosis at the takeoff of the moderate caliber diagonal branch, calcified.  2. Severe distal AV groove stenosis in the left dominant Circumflex. Severe OM 2 stenosis.  3. Moderate caliber non-dominant  RCA with patent proximal stent with moderately severe restenosis. Severe mid RCA stenosis.  4. Moderately severe to severe aortic stenosis (mean gradient 26.6 mmHg, peak to peak gradient 31 mmHg, AVA 1.06 cm2).   Recommendations: He has moderately severe to severe AS and three vessel CAD. I will admit him to telemetry. Given elevated pressures, will begin IV Lasix. I will ask our CT surgery team to see him to discuss CABG/surgical AVR vs staged PCI and TAVR. Will obtain pre-TAVR CT scans to fully assess his aorta and access.   In regards to bypass, the RCA is non-dominant. I think he would benefit from bypass  of the LAD and potentially the OM branch and left sided PDA. If he is not felt to be a candidate for CABG, we would plan staged PCI of the LAD which would require atherectomy prior to stent placement. The LAD is calcified. The proximal LAD stenosis could be easily treated with orbital atherectomy and stenting. The mid LAD lesion is at the takeoff of a moderate caliber diagonal branch which also has a severe ostial stenosis. This would be higher risk in regards to potentially losing flow down that diagonal branch post stenting of the LAD.   He has mild memory issues but is overall functional at baseline, recently limited by dyspnea with exertion. He may be a candidate for open surgery with AVR and bypass.   __________________  Echo 10/31/19 IMPRESSIONS  1. Left ventricular ejection fraction, by estimation, is 60 to 65%. The  left ventricle has normal function. The left ventricle has no regional  wall motion abnormalities. There is mild left ventricular hypertrophy.  Left ventricular diastolic parameters  are indeterminate.  2. Right ventricular systolic function is normal. The right ventricular  size is normal. Tricuspid regurgitation signal is inadequate for assessing  PA pressure.  3. The mitral valve is grossly normal with mild annular calcification.  Trivial mitral valve  regurgitation.  4. The aortic valve is tricuspid. There is moderate calcification of the  aortic valve. Aortic valve regurgitation is not visualized. Moderate to  severe aortic valve stenosis. Aortic valve mean gradient measures 23.2  mmHg. Aortic valve Vmax measures 3.21  m/s. Dimentionless index 0.28.  5. Aortic dilatation noted. There is borderline dilatation of the  ascending aorta, measuring 40 mm.  6. Unable to estimate CVP.   FINDINGS  Left Ventricle: Left ventricular ejection fraction, by estimation, is 60  to 65%. The left ventricle has normal function. The left ventricle has no  regional wall motion abnormalities. The left ventricular internal cavity  size was normal in size. There is  mild left ventricular hypertrophy. Left ventricular diastolic parameters  are indeterminate.   Right Ventricle: The right ventricular size is normal. No increase in  right ventricular wall thickness. Right ventricular systolic function is  normal. Tricuspid regurgitation signal is inadequate for assessing PA  pressure.   Left Atrium: Left atrial size was normal in size.   Right Atrium: Right atrial size was normal in size.   Pericardium: There is no evidence of pericardial effusion.   Mitral Valve: The mitral valve is grossly normal. Mild mitral annular  calcification. Trivial mitral valve regurgitation.   Tricuspid Valve: The tricuspid valve is grossly normal. Tricuspid valve  regurgitation is mild.   Aortic Valve: The aortic valve is tricuspid. There is moderate  calcification of the aortic valve. There is moderate aortic valve annular  calcification. Aortic valve regurgitation is not visualized. Moderate to  severe aortic stenosis is present. Aortic  valve mean gradient measures 23.2 mmHg. Aortic valve peak gradient  measures 41.2 mmHg. Aortic valve area, by VTI measures 0.98 cm.   Pulmonic Valve: The pulmonic valve was grossly normal. Pulmonic valve  regurgitation is  trivial.   Aorta: Aortic dilatation noted. There is borderline dilatation of the  ascending aorta, measuring 40 mm.   Venous: Unable to estimate CVP. IVC assessment for right atrial pressure  unable to be performed due to mechanical ventilation.   IAS/Shunts: The interatrial septum was not well visualized.     LEFT VENTRICLE  PLAX 2D  LVIDd:     5.37 cm  Diastology  LVIDs:     3.11 cm   LV e' medial:  6.87 cm/s  LV PW:     1.20 cm   LV E/e' medial: 10.9  LV IVS:    1.16 cm   LV e' lateral:  5.71 cm/s  LVOT diam:   2.10 cm   LV E/e' lateral: 13.2  LV SV:     56  LV SV Index:  26  LVOT Area:   3.46 cm    LV Volumes (MOD)  LV vol d, MOD A2C: 93.9 ml  LV vol d, MOD A4C: 120.0 ml  LV vol s, MOD A2C: 37.0 ml  LV vol s, MOD A4C: 50.0 ml  LV SV MOD A2C:   56.9 ml  LV SV MOD A4C:   120.0 ml  LV SV MOD BP:   64.7 ml   RIGHT VENTRICLE  RV S prime:   17.40 cm/s  TAPSE (M-mode): 2.3 cm   LEFT ATRIUM       Index  LA diam:    3.00 cm 1.39 cm/m  LA Vol (A2C):  41.9 ml 19.46 ml/m  LA Vol (A4C):  60.9 ml 28.28 ml/m  LA Biplane Vol:     23.90 ml/m  AORTIC VALVE  AV Area (Vmax):  1.01 cm  AV Area (Vmean):  1.03 cm  AV Area (VTI):   0.98 cm  AV Vmax:      320.80 cm/s  AV Vmean:     221.600 cm/s  AV VTI:      0.578 m  AV Peak Grad:   41.2 mmHg  AV Mean Grad:   23.2 mmHg  LVOT Vmax:     93.80 cm/s  LVOT Vmean:    66.200 cm/s  LVOT VTI:     0.163 m  LVOT/AV VTI ratio: 0.28    AORTA  Ao Root diam: 3.70 cm  Ao Asc diam: 4.00 cm   MITRAL VALVE        TRICUSPID VALVE  MV Area (PHT):       TR Peak grad:  7.0 mmHg  MV Decel Time: 209 msec  TR Vmax:    132.00 cm/s  MR Peak grad: 10.1 mmHg  MR Vmax:   159.00 cm/s SHUNTS  MV E velocity: 75.20 cm/s Systemic VTI: 0.16 m  MV A velocity: 85.90 cm/s Systemic Diam: 2.10 cm  MV E/A ratio: 0.88    Rozann Lesches MD  Electronically signed by Rozann Lesches MD  Signature Date/Time: 10/31/2019/11:33:28 AM     Patient Profile     [name] is a 80 y.o. male with a history of CAD s/p BMS to RCA (2006), HTN, GERD, HLD, sleep apnea, IDDM and moderate to severe aortic stenosis undergoing TAVR work up who presented to [facility] on 11/14/19 for planned diagnostic cath. Cath showed elevated filling pressures and multivessel CAD. He was admitted for IV diuresis and CT surgery consult to discuss CABG/surgical AVR vs staged PCI and TAVR.  Assessment & Plan    Acute on chronic diastolic CHF: started on IV lasix 40mg  BID. Net neg 1L and weight down 2lbs. Bmet pending this AM.   Moderate to severe AS: echo 10/31/19 showed LVEF 60-65%, mild LVH, moderate-severe AS with a mean gradient 23.2 mmHg, peak gradient 41.2 mmHg, AVA 0.98 cm2, DVI 0.28. His echo parameters suggest moderate to severe AS.Given the patient's progressive exertional dyspnea, TAVR work up was initiated. Pre TAVR cath data: mean gradient 26.6 mmHg, peak to peak gradient  31 mmHg, AVA 1.06 cm2. Cardiac CT and CT angiography ordered for today. Bmet pending.  Multivessel CAD with unstable angina: cath yesterday showed severe, calcified proximal LAD stenosis; severe, calcified mid LAD stenosis at the takeoff of the moderate caliber diagonal branch; severe distal AV groove stenosis in the left dominant Circumflex; severe OM 2 stenosis, moderate caliber non-dominant RCA with patent proximal stent with moderately severe restenosis, & severe mid RCA stenosis. Plan is for TCTS consult by Dr. Cyndia Bent tomorrow to see if he would be a SAVR/AVR candidate. Dr. Angelena Form felt he would benefit from bypass of the LAD and potentially the OM branch and left sided PDA. If he is not felt to be a candidate for CABG, we would plan staged PCI of the LAD which would require atherectomy prior to stent placement. The LAD is calcified. The proximal LAD stenosis  could be easily treated with orbital atherectomy and stenting. The mid LAD lesion is at the takeoff of a moderate caliber diagonal branch which also has a severe ostial stenosis. This would be higher risk in regards to potentially losing flow down that diagonal branch post stenting of the LAD. Dr. Cyndia Bent to see after scans completed.   DMT2: continue SSI   Signed, Angelena Form, PA-C  11/15/2019, 11:55 AM  Pager [phone]  Patient seen, examined. Available data reviewed. Agree with findings, assessment, and plan as outlined by Nell Range, PA-C.  The patient is independently interviewed and examined.  His wife is at the bedside.  On my exam, JVP is normal, lungs are clear, heart is regular rate and rhythm with grade 3/6 harsh crescendo decrescendo murmur at the right upper sternal border, A2 is audible.  Abdomen is soft, obese, and nontender.  Extremities show no clubbing cyanosis or edema.  Skin is warm dry without rash.  The right groin site is clear.  I removed the patient's Tegaderm dressing from the right groin.  Reviewed available data including his cardiac catheterization and echo findings.  Unfortunately the patient's IV infiltrated during his CTA study today and we did not get any information from the study.  This will be repeated tomorrow.  We will touch base with the CT team and determine whether we should place a PICC line so that we can obtain a good study in the morning.  The patient and his wife understand that the CT findings will be a major factor in determining whether he should be treated with surgical aortic valve replacement/CABG or PCI and TAVR.  We will follow up after his CT scans are completed tomorrow.  The patient appears to be clinically improving from a perspective of his congestive heart failure with continued diuresis.  Sherren Mocha, M.D. 11/15/2019 2:10 PM

## 2019-11-15 NOTE — Discharge Instructions (Addendum)
PLEASE REMEMBER TO BRING ALL OF YOUR MEDICATIONS TO EACH OF YOUR FOLLOW-UP OFFICE VISITS.  PLEASE ATTEND ALL SCHEDULED FOLLOW-UP APPOINTMENTS.   Activity: Increase activity slowly as tolerated. You may shower, but no soaking baths (or swimming) for 1 week. No driving for 2 days. No lifting over 5 lbs for 1 week. No sexual activity for 1 week.   You May Return to Work: in 1 week (if applicable)  Wound Care: You may wash cath site gently with soap and water. Keep cath site clean and dry. If you notice pain, swelling, bleeding or pus at your cath site, please call [phone].    Cardiac Cath Site Care Refer to this sheet in the next few weeks. These instructions provide you with information on caring for yourself after your procedure. Your caregiver may also give you more specific instructions. Your treatment has been planned according to current medical practices, but problems sometimes occur. Call your caregiver if you have any problems or questions after your procedure. HOME CARE INSTRUCTIONS  You may shower 24 hours after the procedure. Remove the bandage (dressing) and gently wash the site with plain soap and water. Gently pat the site dry.   Do not apply powder or lotion to the site.   Do not sit in a bathtub, swimming pool, or whirlpool for 5 to 7 days.   No bending, squatting, or lifting anything over 10 pounds (4.5 kg) as directed by your caregiver.   Inspect the site at least twice daily.   Do not drive home if you are discharged the same day of the procedure. Have someone else drive you.   You may drive 24 hours after the procedure unless otherwise instructed by your caregiver.  What to expect:  Any bruising will usually fade within 1 to 2 weeks.   Blood that collects in the tissue (hematoma) may be painful to the touch. It should usually decrease in size and tenderness within 1 to 2 weeks.  SEEK IMMEDIATE MEDICAL CARE IF:  You have unusual pain at the site or down the  affected limb.   You have redness, warmth, swelling, or pain at the site.   You have drainage (other than a small amount of blood on the dressing).   You have chills.   You have a fever or persistent symptoms for more than 72 hours.   You have a fever and your symptoms suddenly get worse.   Your leg becomes pale, cool, tingly, or numb.   You have heavy bleeding from the site. Hold pressure on the site.  Document Released: 01/30/2010 Document Revised: 12/17/2010 Document Reviewed: 01/30/2010 [facility] Patient Information 2012 [place].   Heart Healthy, Consistent Carbohydrate Nutrition Therapy   A heart-healthy and consistent carbohydrate diet is recommended to manage heart disease and diabetes. To follow a heart-healthy and consistent carbohydrate diet, . Eat a balanced diet with whole grains, fruits and vegetables, and lean protein sources.  . Choose heart-healthy unsaturated fats. Limit saturated fats, trans fats, and cholesterol intake. Eat more plant-based or vegetarian meals using beans and soy foods for protein.  . Eat whole, unprocessed foods to limit the amount of sodium (salt) you eat.  . Choose a consistent amount of carbohydrate at each meal and snack. Limit refined carbohydrates especially sugar, sweets and sugar-sweetened beverages.  . If you drink alcohol, do so in moderation: one serving per day (women) and two servings per day (men). o One serving is equivalent to 12 ounces beer, 5 ounces wine,  or 1.5 ounces distilled spirits  Tips Tips for Choosing Heart-Healthy Fats Choose lean protein and low-fat dairy foods to reduce saturated fat intake. . Saturated fat is usually found in animal-based protein and is associated with certain health risks. Saturated fat is the biggest contributor to raise low-density lipoprotein (LDL) cholesterol levels. Research shows that limiting saturated fat lowers unhealthy cholesterol levels. Eat no more than 7% of your total calories  each day from saturated fat. Ask your RDN to help you determine how much saturated fat is right for you. . There are many foods that do not contain large amounts of saturated fats. Swapping these foods to replace foods high in saturated fats will help you limit the saturated fat you eat and improve your cholesterol levels. You can also try eating more plant-based or vegetarian meals. Instead of. Try:  Whole milk, cheese, yogurt, and ice cream 1% or skim milk, low-fat cheese, non-fat yogurt, and low-fat ice cream  Fatty, marbled beef and pork Lean beef, pork, or venison  Poultry with skin Poultry without skin  Butter, stick margarine Reduced-fat, whipped, or liquid spreads  Coconut oil, palm oil Liquid vegetable oils: corn, canola, olive, soybean and safflower oils   Avoid foods that contain trans fats. . Trans fats increase levels of LDL-cholesterol. Hydrogenated fat in processed foods is the main source of trans fats in foods.  . Trans fats can be found in stick margarine, shortening, processed sweets, baked goods, some fried foods, and packaged foods made with hydrogenated oils. Avoid foods with "partially hydrogenated oil" on the ingredient list such as: cookies, pastries, baked goods, biscuits, crackers, microwave popcorn, and frozen dinners. Choose foods with heart healthy fats. . Polyunsaturated and monounsaturated fat are unsaturated fats that may help lower your blood cholesterol level when used in place of saturated fat in your diet. . Ask your RDN about taking a dietary supplement with plant sterols and stanols to help lower your cholesterol level. Marland Kitchen Research shows that substituting saturated fats with unsaturated fats is beneficial to cholesterol levels. Try these easy swaps: Instead of. Try:  Butter, stick margarine, or solid shortening Reduced-fat, whipped, or liquid spreads  Beef, pork, or poultry with skin Fish and seafood  Chips, crackers, snack foods Raw or unsalted nuts and seeds  or nut butters Hummus with vegetables Avocado on toast  Coconut oil, palm oil Liquid vegetable oils: corn, canola, olive, soybean and safflower oils  Limit the amount of cholesterol you eat to less than 200 milligrams per day. . Cholesterol is a substance carried through the bloodstream via lipoproteins, which are known as "transporters" of fat. Some body functions need cholesterol to work properly, but too much cholesterol in the bloodstream can damage arteries and build up blood vessel linings (which can lead to heart attack and stroke). You should eat less than 200 milligrams cholesterol per day. Marland Kitchen People respond differently to eating cholesterol. There is no test available right now that can figure out which people will respond more to dietary cholesterol and which will respond less. For individuals with high intake of dietary cholesterol, different types of increase (none, small, moderate, large) in LDL-cholesterol levels are all possible.  . Food sources of cholesterol include egg yolks and organ meats such as liver, gizzards. Limit egg yolks to two to four per week and avoid organ meats like liver and gizzards to control cholesterol intake. Tips for Choosing Heart-Healthy Carbohydrates Consume a consistent amount of carbohydrate . It is important to eat foods with carbohydrates  in moderation because they impact your blood glucose level. Carbohydrates can be found in many foods such as: . Grains (breads, crackers, rice, pasta, and cereals)  . Starchy Vegetables (potatoes, corn, and peas)  . Beans and legumes  . Milk, soy milk, and yogurt  . Fruit and fruit juice  . Sweets (cakes, cookies, ice cream, jam and jelly) . Your RDN will help you set a goal for how many carbohydrate servings to eat at your meals and snacks. For many adults, eating 3 to 5 servings of carbohydrate foods at each meal and 1 or 2 carbohydrate servings for each snack works well.  . Check your blood glucose level regularly.  It can tell you if you need to adjust when you eat carbohydrates. . Choose foods rich in viscous (soluble) fiber . Viscous, or soluble, is found in the walls of plant cells. Viscous fiber is found only in plant-based foods. Eating foods with fiber helps to lower your unhealthy cholesterol and keep your blood glucose in range  . Rich sources of viscous fiber include vegetables (asparagus, Brussels sprouts, sweet potatoes, turnips) fruit (apricots, mangoes, oranges), legumes, and whole grains (barley, oats, and oat bran).  . As you increase your fiber intake gradually, also increase the amount of water you drink. This will help prevent constipation.  . If you have difficulty achieving this goal, ask your RDN about fiber laxatives. Choose fiber supplements made with viscous fibers such as psyllium seed husks or methylcellulose to help lower unhealthy cholesterol.  . Limit refined carbohydrates  . There are three types of carbohydrates: starches, sugar, and fiber. Some carbohydrates occur naturally in food, like the starches in rice or corn or the sugars in fruits and milk. Refined carbohydrates--foods with high amounts of simple sugars--can raise triglyceride levels. High triglyceride levels are associated with coronary heart disease. . Some examples of refined carbohydrate foods are table sugar, sweets, and beverages sweetened with added sugar. Tips for Reducing Sodium (Salt) Although sodium is important for your body to function, too much sodium can be harmful for people with high blood pressure. As sodium and fluid buildup in your tissues and bloodstream, your blood pressure increases. High blood pressure may cause damage to other organs and increase your risk for a stroke. Even if you take a pill for blood pressure or a water pill (diuretic) to remove fluid, it is still important to have less salt in your diet. Ask your doctor and RDN what amount of sodium is right for you. Marland Kitchen Avoid processed foods. Eat  more fresh foods.  . Fresh fruits and vegetables are naturally low in sodium, as well as frozen vegetables and fruits that have no added juices or sauces.  . Fresh meats are lower in sodium than processed meats, such as bacon, sausage, and hotdogs. Read the nutrition label or ask your butcher to help you find a fresh meat that is low in sodium. . Eat less salt--at the table and when cooking.  . A single teaspoon of table salt has 2,300 mg of sodium.  . Leave the salt out of recipes for pasta, casseroles, and soups.  . Ask your RDN how to cook your favorite recipes without sodium . Be a Paramedic.  . Look for food packages that say "salt-free" or "sodium-free." These items contain less than 5 milligrams of sodium per serving.  Marland Kitchen "Very low-sodium" products contain less than 35 milligrams of sodium per serving.  Marland Kitchen "Low-sodium" products contain less than 140 milligrams of  sodium per serving.  . Beware for "Unsalted" or "No Added Salt" products. These items may still be high in sodium. Check the nutrition label. . Add flavors to your food without adding sodium.  . Try lemon juice, lime juice, fruit juice or vinegar.  . Dry or fresh herbs add flavor. Try basil, bay leaf, dill, rosemary, parsley, sage, dry mustard, nutmeg, thyme, and paprika.  . Pepper, red pepper flakes, and cayenne pepper can add spice t your meals without adding sodium. Hot sauce contains sodium, but if you use just a drop or two, it will not add up to much.  Sharyn Lull a sodium-free seasoning blend or make your own at home. Additional Lifestyle Tips Achieve and maintain a healthy weight. . Talk with your RDN or your doctor about what is a healthy weight for you. . Set goals to reach and maintain that weight.  . To lose weight, reduce your calorie intake along with increasing your physical activity. A weight loss of 10 to 15 pounds could reduce LDL-cholesterol by 5 milligrams per deciliter. Participate in physical activity. . Talk  with your health care team to find out what types of physical activity are best for you. Set a plan to get about 30 minutes of exercise on most days.  Foods Recommended Food Group Foods Recommended  Grains Whole grain breads and cereals, including whole wheat, barley, rye, buckwheat, corn, teff, quinoa, millet, amaranth, brown or wild rice, sorghum, and oats Pasta, especially whole wheat or other whole grain types  [employer], quinoa or wild rice Whole grain crackers, bread, rolls, pitas Home-made bread with reduced-sodium baking soda  Protein Foods Lean cuts of beef and pork (loin, leg, round, extra lean hamburger)  Skinless Cytogeneticist and other wild game Dried beans and peas Nuts and nut butters Meat alternatives made with soy or textured vegetable protein  Egg whites or egg substitute Cold cuts made with lean meat or soy protein  Dairy Nonfat (skim), low-fat, or 1%-fat milk  Nonfat or low-fat yogurt or cottage cheese Fat-free and low-fat cheese  Vegetables Fresh, frozen, or canned vegetables without added fat or salt   Fruits Fresh, frozen, canned, or dried fruit   Oils Unsaturated oils (corn, olive, peanut, soy, sunflower, canola)  Soft or liquid margarines and vegetable oil spreads  Salad dressings Seeds and nuts  Avocado   Foods Not Recommended Food Group Foods Not Recommended  Grains Breads or crackers topped with salt Cereals (hot or cold) with more than 300 mg sodium per serving Biscuits, cornbread, and other "quick" breads prepared with baking soda Bread crumbs or stuffing mix from a store High-fat bakery products, such as doughnuts, biscuits, croissants, danish pastries, pies, cookies Instant cooking foods to which you add hot water and stir--potatoes, noodles, rice, etc. Packaged starchy foods--seasoned noodle or rice dishes, stuffing mix, macaroni and cheese dinner Snacks made with partially hydrogenated oils, including chips, cheese puffs, snack mixes,  regular crackers, butter-flavored popcorn  Protein Foods Higher-fat cuts of meats (ribs, t-bone steak, regular hamburger) Bacon, sausage, or hot dogs Cold cuts, such as salami or bologna, deli meats, cured meats, corned beef Organ meats (liver, brains, gizzards, sweetbreads) Poultry with skin Fried or smoked meat, poultry, and fish Whole eggs and egg yolks (more than 2-4 per week) Salted legumes, nuts, seeds, or nut/seed butters Meat alternatives with high levels of sodium (>300 mg per serving) or saturated fat (>5 g per serving)  Dairy Whole milk,?2% fat milk, buttermilk Whole milk yogurt  or ice cream Cream Half-&-half Cream cheese Sour cream Cheese  Vegetables Canned or frozen vegetables with salt, fresh vegetables prepared with salt, butter, cheese, or cream sauce Fried vegetables Pickled vegetables such as olives, pickles, or sauerkraut  Fruits Fried fruits Fruits served with butter or cream  Oils Butter, stick margarine, shortening Partially hydrogenated oils or trans fats Tropical oils (coconut, palm, palm kernel oils)  Other Candy, sugar sweetened soft drinks and desserts Salt, sea salt, garlic salt, and seasoning mixes containing salt Bouillon cubes Ketchup, barbecue sauce, Worcestershire sauce, soy sauce, teriyaki sauce Miso Salsa Pickles, olives, relish   Heart Healthy Consistent Carbohydrate Vegetarian (Lacto-Ovo) Sample 1-Day Menu  Breakfast 1 cup oatmeal, cooked (2 carbohydrate servings)   cup blueberries (1 carbohydrate serving)  11 almonds, without salt  1 cup 1% milk (1 carbohydrate serving)  1 cup coffee  Morning Snack 1 cup fat-free plain yogurt (1 carbohydrate serving)  Lunch 1 whole wheat bun (1 carbohydrate servings)  1 black bean burger (1 carbohydrate servings)  1 slice cheddar cheese, low sodium  2 slices tomatoes  2 leaves lettuce  1 teaspoon mustard  1 small pear (1 carbohydrate servings)  1 cup green tea, unsweetened  Afternoon Snack 1/3  cup trail mix with nuts, seeds, and raisins, without salt (1 carbohydrate servinga)  Evening Meal  cup meatless chicken  2/3 cup brown rice, cooked (2 carbohydrate servings)  1 cup broccoli, cooked (2/3 carbohydrate serving)   cup carrots, cooked (1/3 carbohydrate serving)  2 teaspoons olive oil  1 teaspoon balsamic vinegar  1 whole wheat dinner roll (1 carbohydrate serving)  1 teaspoon margarine, soft, tub  1 cup 1% milk (1 carbohydrate serving)  Evening Snack 1 extra small banana (1 carbohydrate serving)  1 tablespoon peanut butter   Heart Healthy Consistent Carbohydrate Vegan Sample 1-Day Menu  Breakfast 1 cup oatmeal, cooked (2 carbohydrate servings)   cup blueberries (1 carbohydrate serving)  11 almonds, without salt  1 cup soymilk fortified with calcium, vitamin B12, and vitamin D  1 cup coffee  Morning Snack 6 ounces soy yogurt (1 carbohydrate servings)  Lunch 1 whole wheat bun(1 carbohydrate servings)  1 black bean burger (1 carbohydrate serving)  2 slices tomatoes  2 leaves lettuce  1 teaspoon mustard  1 small pear (1 carbohydrate servings)  1 cup green tea, unsweetened  Afternoon Snack 1/3 cup trail mix with nuts, seeds, and raisins, without salt (1 carbohydrate servings)  Evening Meal  cup meatless chicken  2/3 cup brown rice, cooked (2 carbohydrate servings)  1 cup broccoli, cooked (2/3 carbohydrate serving)   cup carrots, cooked (1/3 carbohydrate serving)  2 teaspoons olive oil  1 teaspoon balsamic vinegar  1 whole wheat dinner roll (1 carbohydrate serving)  1 teaspoon margarine, soft, tub  1 cup soymilk fortified with calcium, vitamin B12, and vitamin D  Evening Snack 1 extra small banana (1 carbohydrate serving)  1 tablespoon peanut butter    Heart Healthy Consistent Carbohydrate Sample 1-Day Menu  Breakfast 1 cup cooked oatmeal (2 carbohydrate servings)  3/4 cup blueberries (1 carbohydrate serving)  1 ounce almonds  1 cup skim milk (1 carbohydrate  serving)  1 cup coffee  Morning Snack 1 cup sugar-free nonfat yogurt (1 carbohydrate serving)  Lunch 2 slices whole-wheat bread (2 carbohydrate servings)  2 ounces lean [place] breast  1 ounce low-fat Swiss cheese  1 teaspoon mustard  1 slice tomato  1 lettuce leaf  1 small pear (1 carbohydrate serving)  1 cup  skim milk (1 carbohydrate serving)  Afternoon Snack 1 ounce trail mix with unsalted nuts, seeds, and raisins (1 carbohydrate serving)  Evening Meal 3 ounces salmon  2/3 cup cooked brown rice (2 carbohydrate servings)  1 teaspoon soft margarine  1 cup cooked broccoli with 1/2 cup cooked carrots (1 carbohydrate serving  Carrots, cooked, boiled, drained, without salt  1 cup lettuce  1 teaspoon olive oil with vinegar for dressing  1 small whole grain roll (1 carbohydrate serving)  1 teaspoon soft margarine  1 cup unsweetened tea  Evening Snack 1 extra-small banana (1 carbohydrate serving)  Copyright 2020  [employer]. All rights reserved.

## 2019-11-16 DIAGNOSIS — I2510 Atherosclerotic heart disease of native coronary artery without angina pectoris: Secondary | ICD-10-CM

## 2019-11-16 DIAGNOSIS — I350 Nonrheumatic aortic (valve) stenosis: Secondary | ICD-10-CM

## 2019-11-16 LAB — CBC
HCT: 42.2 % (ref 39.0–52.0)
Hemoglobin: 13.7 g/dL (ref 13.0–17.0)
MCH: 27.8 pg (ref 26.0–34.0)
MCHC: 32.5 g/dL (ref 30.0–36.0)
MCV: 85.6 fL (ref 80.0–100.0)
Platelets: 296 10*3/uL (ref 150–400)
RBC: 4.93 MIL/uL (ref 4.22–5.81)
RDW: 14.8 % (ref 11.5–15.5)
WBC: 6.9 10*3/uL (ref 4.0–10.5)
nRBC: 0 % (ref 0.0–0.2)

## 2019-11-16 LAB — BASIC METABOLIC PANEL
Anion gap: 12 (ref 5–15)
BUN: 16 mg/dL (ref 8–23)
CO2: 25 mmol/L (ref 22–32)
Calcium: 8.9 mg/dL (ref 8.9–10.3)
Chloride: 99 mmol/L (ref 98–111)
Creatinine, Ser: 1.17 mg/dL (ref 0.61–1.24)
GFR, Estimated: >60 mL/min (ref >60)
Glucose, Bld: 269 mg/dL — ABNORMAL HIGH (ref 70–99)
Potassium: 3.6 mmol/L (ref 3.5–5.1)
Sodium: 136 mmol/L (ref 135–145)

## 2019-11-16 LAB — LIPID PANEL
Cholesterol: 123 mg/dL (ref 0–200)
HDL: 32 mg/dL — ABNORMAL LOW (ref >40)
LDL Cholesterol: 66 mg/dL (ref 0–99)
Total CHOL/HDL Ratio: 3.8 RATIO
Triglycerides: 125 mg/dL (ref <150)
VLDL: 25 mg/dL (ref 0–40)

## 2019-11-16 LAB — GLUCOSE, CAPILLARY
Glucose-Capillary: 251 mg/dL — ABNORMAL HIGH (ref 70–99)
Glucose-Capillary: 415 mg/dL — ABNORMAL HIGH (ref 70–99)
Glucose-Capillary: 221 mg/dL — ABNORMAL HIGH (ref 70–99)

## 2019-11-16 MED ORDER — INSULIN GLARGINE 100 UNIT/ML ~~LOC~~ SOLN
62.0000 [IU] | Freq: Every day | SUBCUTANEOUS | Status: DC
  Filled 2019-11-16: qty 0.62

## 2019-11-16 MED ORDER — POTASSIUM CHLORIDE CRYS ER 20 MEQ PO TBCR: 20.0000 meq | EXTENDED_RELEASE_TABLET | Freq: Every day | ORAL | 11 refills | Status: DC

## 2019-11-16 MED ORDER — AMLODIPINE BESYLATE 10 MG PO TABS: 10.0000 mg | ORAL_TABLET | Freq: Every day | ORAL | Status: DC

## 2019-11-16 MED ORDER — CLOPIDOGREL BISULFATE 75 MG PO TABS
75.0000 mg | ORAL_TABLET | Freq: Every day | ORAL | Status: DC
  Administered 2019-11-16: 75 mg via ORAL
  Filled 2019-11-16: qty 1

## 2019-11-16 MED ORDER — ARTIFICIAL TEARS OPHTHALMIC OINT
TOPICAL_OINTMENT | OPHTHALMIC | Status: DC | PRN
  Administered 2019-11-16: 1 via OPHTHALMIC
  Filled 2019-11-16: qty 3.5

## 2019-11-16 MED ORDER — AMLODIPINE BESYLATE 10 MG PO TABS: 10.0000 mg | ORAL_TABLET | Freq: Every day | ORAL | 11 refills | Status: DC

## 2019-11-16 MED ORDER — FUROSEMIDE 20 MG PO TABS: 60.0000 mg | ORAL_TABLET | Freq: Every day | ORAL | 3 refills | Status: DC

## 2019-11-16 MED ORDER — POTASSIUM CHLORIDE CRYS ER 20 MEQ PO TBCR
20.0000 meq | EXTENDED_RELEASE_TABLET | Freq: Every day | ORAL | Status: DC
  Administered 2019-11-16: 20 meq via ORAL
  Filled 2019-11-16: qty 1

## 2019-11-16 MED ORDER — ATORVASTATIN CALCIUM 40 MG PO TABS: 40.0000 mg | ORAL_TABLET | Freq: Every evening | ORAL | 11 refills | Status: DC

## 2019-11-16 MED ORDER — FUROSEMIDE 40 MG PO TABS: 60.0000 mg | ORAL_TABLET | Freq: Every day | ORAL | Status: DC

## 2019-11-16 MED ORDER — ATORVASTATIN CALCIUM 40 MG PO TABS
40.0000 mg | ORAL_TABLET | Freq: Every day | ORAL | Status: DC
  Administered 2019-11-16: 40 mg via ORAL
  Filled 2019-11-16: qty 1

## 2019-11-16 MED ORDER — CLOPIDOGREL BISULFATE 75 MG PO TABS: 75.0000 mg | ORAL_TABLET | Freq: Every day | ORAL | 11 refills | Status: DC

## 2019-11-16 MED ORDER — INSULIN GLARGINE 100 UNIT/ML ~~LOC~~ SOLN
10.0000 [IU] | Freq: Once | SUBCUTANEOUS | Status: AC
  Administered 2019-11-16: 10 [IU] via SUBCUTANEOUS
  Filled 2019-11-16: qty 0.1

## 2019-11-16 MED ORDER — FUROSEMIDE 10 MG/ML IJ SOLN
40.0000 mg | Freq: Once | INTRAMUSCULAR | Status: AC
  Administered 2019-11-16: 40 mg via INTRAVENOUS
  Filled 2019-11-16: qty 4

## 2019-11-16 MED ORDER — POTASSIUM CHLORIDE CRYS ER 20 MEQ PO TBCR
40.0000 meq | EXTENDED_RELEASE_TABLET | Freq: Once | ORAL | Status: AC
  Administered 2019-11-16: 40 meq via ORAL
  Filled 2019-11-16: qty 2

## 2019-11-16 NOTE — Plan of Care (Signed)
  Problem: Clinical Measurements: Goal: Will remain free from infection Outcome: Completed/Met   Problem: Nutrition: Goal: Adequate nutrition will be maintained Outcome: Completed/Met   Problem: Elimination: Goal: Will not experience complications related to urinary retention Outcome: Completed/Met   Problem: Pain Managment: Goal: General experience of comfort will improve Outcome: Completed/Met   Problem: Skin Integrity: Goal: Risk for impaired skin integrity will decrease Outcome: Completed/Met

## 2019-11-16 NOTE — Consult Note (Addendum)
HEART AND [place] VALVE TEAM  Cardiology Consultation:   Patient ID: [name] MRN: [id number]; DOB: January 19, 1940  Admit date: 11/14/2019 Date of Consult: 11/16/2019  Primary Care Provider: Dettinger, Fransisca Kaufmann, MD [facility] Cardiologist: Rozann Lesches, MD  [facility] Electrophysiologist:  None   Patient Profile:   [name] is a 79 y.o. male with a history of CAD s/p BMS to RCA (2006), HTN, GERD, HLD, morbid obesity, sleep apnea, IDDM and moderate to severe aortic stenosis undergoing TAVR work up who presented to [facility] on 11/14/19 for planned diagnostic cath. Cath showed elevated filling pressures and multivessel CAD. He was admitted for IV diuresis and CT surgery consult to discuss CABG/surgical AVR vs staged PCI and TAVR.  History of Present Illness:   Mr. Barley lives in [place] with his wife.  He worked for [employer] for many years and retired around 66 years ago.  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  The patient has one grown son and two grandchildren.  The patient is very sedentary and the most activity he gets is walking out to his car and riding his lawnmower.  He does not require the aid of a walker or cane.  He still drives.  He is mostly limited by bilateral hip pain but also over the past year from progressive dyspnea on exertion, as well.  The patient has full top dentures and dental implants on the bottom.  However, he has 4 of his native teeth remaining and has not seen a dentist in many years.  He is fully vaccinated for Covid with [employer].  He is known to have CAD with last cardiac cath in 2006 at which time a bare metal stent was placed in the RCA.  He has been followed by Dr. Domenic Polite for moderate aortic stenosis.  His most recent echocardiogram on 10/31/2019 showed EF 60 to 65%, mild LVH, moderate to severe atrial stenosis with a mean gradient of 23.2 mmHg, peak gradient 41.2 mmHg,  AVA 0.98 cm, DVI 0.28  He has been followed for moderate aortic stenosis. Most recent echocardiogram 10/31/19 with LVEF=60-65%, mild LVH. The aortic valve leaflets are thickened and calcified with limited leaflet excursion. Mean gradient 23.2 mmHg, peak gradient 41.2 mmHg, AVA 0.98 cm2, dimensionless index 0.28, stroke-volume index 26.  He was recently seen in the office by Dr. Domenic Polite and described worsening dyspnea on exertion and progressive fatigue and TAVR work-up was initiated.  He was seen in the office by Dr. Angelena Form on 11/02/2019 for structural heart consultation.  Diagnostic heart catheterization was set up for 11/14/2019 which showed severe, calcified proximal LAD stenosis; severe, calcified mid LAD stenosis at the takeoff of the moderate caliber diagonal branch; severe distal AV groove stenosis in the left dominant Circumflex; severe OM 2 stenosis, moderate caliber non-dominant RCA with patent proximal stent with moderately severe restenosis, & severe mid RCA stenosis. Dr. Angelena Form felt he would benefit from bypass of the LAD and potentially the OM branch and left sided PDA. If he is not felt to be a candidate for CABG, we would plan staged PCI of the LAD which would require atherectomy prior to stent placement. Cardiac gated CTA of the heart reveals anatomical characteristics consistent with aortic stenosis suitable for treatment by transcatheter aortic valve replacement without any significant complicating features. However, there is bulky calcifications noted in the noncoronary cusp. CTA of the aorta and iliac vessels demonstrate what appear to be  adequate pelvic vascular access to facilitate a transfemoral approach.     Cardiothoracic surgery is consulted for CABG/AVR versus PCI/TAVR.  The patient is seen laying in bed today.  His wife is present at the bedside.  He reports worsening dyspnea on exertion over the past 6 to 12 months which has severely limited his activities.  He occasionally  has swelling in his feet as well as orthopnea.  He denies PND and sleeps with a CPAP.  He denies dizziness or syncope.  He has had progressive fatigue as well.  He reports feeling much better after IV diuresis and can more easily walk the halls today.    Past Medical History:  Diagnosis Date  . Anxiety   . Aortic atherosclerosis ([place]) 12/19/2017  . Arthritis   . Bursitis of hip   . Cervical spondylosis   . Chronic headaches   . Coronary atherosclerosis of native coronary artery    BMS nondominant RCA 12/2004  . Essential hypertension   . GERD (gastroesophageal reflux disease)   . History of adenomatous polyp of colon   . History of kidney stones   . History of MI (myocardial infarction) 12/2004  . History of viral meningitis 02/28/2018   December 2019  . Hyperlipidemia   . Internal hemorrhoids   . Low back pain   . Nocturia more than twice per night   . OSA (obstructive sleep apnea)   . Spinal stenosis   . Type 2 diabetes mellitus treated with insulin ([place])   . Wears dentures    Upper    Past Surgical History:  Procedure Laterality Date  . CARDIAC CATHETERIZATION  06-14-2004  dr Lia Foyer   total occlusion mD1, RI 30-40%, mRCA nondominant hazy 80% and scattered 30-40%,  ef 71% (medical mangement)  . CARDIOVASCULAR STRESS TEST  09-17-2015   dr Domenic Polite   Low risk nuclear study w/ reversible mild small anteroapical defect,  normal LV function and wall motion, nuclear stress ef 61%  . CATARACT EXTRACTION W/ INTRAOCULAR LENS IMPLANT Right 07/2014  . COLONOSCOPY  09/10/2008   normal  . CORONARY ANGIOPLASTY WITH STENT PLACEMENT  12-17-2004   dr benismhon/ dr Olevia Perches   nonobstructive cad LAD and LCFx,  BMS x1 to total occlusion RCA   . CYSTOSCOPY W/ URETEROSCOPY W/ LITHOTRIPSY  08/2010  . ERCP  03/14/2008  . EYE SURGERY Bilateral    cataracts  . LAPAROSCOPIC CHOLECYSTECTOMY  05/2012  . LEFT HEART CATHETERIZATION WITH CORONARY ANGIOGRAM N/A 05/14/2011   Procedure: LEFT HEART  CATHETERIZATION WITH CORONARY ANGIOGRAM;  Surgeon: Sherren Mocha, MD;  Location: [facility] CATH LAB;  Service: Cardiovascular;  Laterality: N/A;    non-obstructive LM, LAD, LCFx, patent RCA stent  . LUMBAR LAMINECTOMY/DECOMPRESSION MICRODISCECTOMY N/A 02/24/2017   Procedure: Microlumbar decompression L4-5, L5-S1;  Surgeon: Susa Day, MD;  Location: [facility] ORS;  Service: Orthopedics;  Laterality: N/A;  120 mins  . RIGHT/LEFT HEART CATH AND CORONARY ANGIOGRAPHY N/A 11/14/2019   Procedure: RIGHT/LEFT HEART CATH AND CORONARY ANGIOGRAPHY;  Surgeon: Burnell Blanks, MD;  Location: [facility] CV LAB;  Service: Cardiovascular;  Laterality: N/A;  . ROTATOR CUFF REPAIR Right 03/2002  . ROTATOR CUFF REPAIR Left 12/11/2015  . TOTAL HIP ARTHROPLASTY Right 12/20/2008  . TOTAL HIP ARTHROPLASTY Left 07/05/2018   Procedure: TOTAL HIP ARTHROPLASTY ANTERIOR APPROACH;  Surgeon: Gaynelle Arabian, MD;  Location: [facility] ORS;  Service: Orthopedics;  Laterality: Left;  175min  . TOTAL KNEE ARTHROPLASTY Bilateral left 12-11-2003/  right 07-31-2004   dr Wynelle Link [facility]  .  UPPER GASTROINTESTINAL ENDOSCOPY  01/08/2008   bx, inlet patch, duodenitis  . YAG LASER APPLICATION Right 5/95/6387   Procedure: YAG LASER APPLICATION;  Surgeon: Williams Che, MD;  Location: [facility] ORS;  Service: Ophthalmology;  Laterality: Right;     Home Medications:  Prior to Admission medications   Medication Sig Start Date End Date Taking? Authorizing Provider  acetaminophen (TYLENOL) 650 MG CR tablet Take 1,300 mg by mouth in the morning, at noon, and at bedtime.    Yes [provider]  amLODipine (NORVASC) 5 MG tablet TAKE 1 TABLET BY MOUTH  DAILY Patient taking differently: Take 5 mg by mouth daily.  10/24/19  Yes Dettinger, Fransisca Kaufmann, MD  aspirin EC 81 MG tablet Take 81 mg by mouth at bedtime.    Yes [provider]  atorvastatin (LIPITOR) 20 MG tablet TAKE 1 TABLET BY MOUTH IN  THE EVENING Patient taking differently: Take 20 mg by  mouth at bedtime.  10/24/19  Yes Dettinger, Fransisca Kaufmann, MD  butalbital-acetaminophen-caffeine (FIORICET) [phone] MG tablet Take 1 tablet by mouth 2 (two) times daily as needed for headache.   Yes [provider]  cetirizine (ZYRTEC) 10 MG tablet Take 10 mg by mouth daily with supper.    Yes [provider]  Cholecalciferol (VITAMIN D3) 50 MCG (2000 UT) capsule Take 4,000 Units by mouth daily.    Yes [provider]  Continuous Blood Gluc Receiver (DEXCOM G6 RECEIVER) DEVI USE TO CHECK BLOOD SUGAR UP TO 4 TIMES DAILY AS DIRECTED. 1 DEVICE PER YEAR. DX: E11.9 06/28/19  Yes Dettinger, Fransisca Kaufmann, MD  Continuous Blood Gluc Sensor (DEXCOM G6 SENSOR) MISC USE TO CHECK BLOOD SUGAR UP TO 4 TIMES DAILY AS DIRECTED. CHANGE SENSOR EVERY 30 DAYS. DX: E11.9 06/28/19  Yes Dettinger, Fransisca Kaufmann, MD  Continuous Blood Gluc Transmit (DEXCOM G6 TRANSMITTER) MISC USE TO CHECK BLOOD SUGAR UP TO 4 TIMES DAILY AS DIRECTED. CHANGE TRANSMITTER EVERY 3 MONTHS. DX: E11.9 06/28/19  Yes Dettinger, Fransisca Kaufmann, MD  docusate sodium (COLACE) 100 MG capsule Take 100 mg by mouth daily as needed for mild constipation or moderate constipation.    Yes [provider]  donepezil (ARICEPT) 5 MG tablet Take 5 mg by mouth at bedtime.   Yes [provider]  famotidine (PEPCID) 20 MG tablet TAKE 1 TABLET BY MOUTH  TWICE DAILY WITH MEALS Patient taking differently: Take 20 mg by mouth 2 (two) times daily.  10/24/19  Yes Dettinger, Fransisca Kaufmann, MD  glucose blood test strip Test 4 times daily Dx E11.59 11/23/18  Yes Dettinger, Fransisca Kaufmann, MD  HUMALOG KWIKPEN 100 UNIT/ML KwikPen INJECT 0 TO 15 UNITS  SUBCUTANEOUSLY 3 TIMES  DAILY BEFORE MEALS ;  70-150=8 UNITS, 151-200= 9  UNITS. Patient taking differently: Inject 15-20 Units into the skin 3 (three) times daily before meals. Sliding scale 07/17/19  Yes Dettinger, Fransisca Kaufmann, MD  isosorbide mononitrate (IMDUR) 30 MG 24 hr tablet TAKE 1 TABLET BY MOUTH IN  THE  EVENING Patient taking differently: Take 30 mg by mouth at bedtime.  09/10/19  Yes Satira Sark, MD  LANTUS SOLOSTAR 100 UNIT/ML Solostar Pen INJECT SUBCUTANEOUSLY 52  UNITS AT BEDTIME Patient taking differently: Inject 56 Units into the skin at bedtime.  04/23/19  Yes Dettinger, Fransisca Kaufmann, MD  losartan (COZAAR) 50 MG tablet TAKE 1 TABLET BY MOUTH  DAILY Patient taking differently: Take 50 mg by mouth daily.  07/26/19  Yes Dettinger, Fransisca Kaufmann, MD  metFORMIN Ernestina Penna)  500 MG 24 hr tablet TAKE 2 TABLETS BY MOUTH  TWICE DAILY Patient taking differently: Take 1,000 mg by mouth in the morning and at bedtime.  09/10/19  Yes Dettinger, Fransisca Kaufmann, MD  Omega-3 Fatty Acids (FISH OIL) 1200 MG CAPS Take 1,200-2,400 mg by mouth See admin instructions. Tale 1200 mg in the morning and 2400 mg in the evening   Yes [provider]  PARoxetine (PAXIL) 20 MG tablet TAKE 1 TABLET BY MOUTH  DAILY Patient taking differently: Take 20 mg by mouth at bedtime.  10/24/19  Yes Dettinger, Fransisca Kaufmann, MD  tamsulosin (FLOMAX) 0.4 MG CAPS capsule TAKE 1 CAPSULE BY MOUTH  DAILY Patient taking differently: Take 0.4 mg by mouth daily with supper.  12/18/18  Yes Dettinger, Fransisca Kaufmann, MD  Turmeric Curcumin 500 MG CAPS Take 500 mg by mouth daily.   Yes [provider]  vitamin B-12 (CYANOCOBALAMIN) 1000 MCG tablet Take 2,000 mcg by mouth daily.   Yes [provider]  nitroGLYCERIN (NITROSTAT) 0.4 MG SL tablet DISSOLVE ONE TABLET UNDER THE TONGUE EVERY 5 MINUTES AS NEEDED FOR CHEST PAIN.  DO NOT EXCEED A TOTAL OF 3 DOSES IN 15 MINUTES Patient taking differently: Place 0.4 mg under the tongue every 5 (five) minutes as needed for chest pain. DISSOLVE ONE TABLET UNDER THE TONGUE EVERY 5 MINUTES AS NEEDED FOR CHEST PAIN.  DO NOT EXCEED A TOTAL OF 3 DOSES IN 15 MINUTES 10/24/19   Satira Sark, MD    Inpatient Medications: Scheduled Meds: . acetaminophen  1,000 mg Oral Q8H  . amLODipine  5 mg Oral Daily   . aspirin EC  81 mg Oral Daily  . atorvastatin  40 mg Oral q1800  . docusate sodium  100 mg Oral BID WC  . donepezil  5 mg Oral QHS  . famotidine  20 mg Oral BID WC  . furosemide  40 mg Intravenous Q12H  . insulin aspart  15-20 Units Subcutaneous TID WC  . insulin glargine  62 Units Subcutaneous QHS  . isosorbide mononitrate  30 mg Oral Daily  . loratadine  10 mg Oral Q supper  . PARoxetine  20 mg Oral Daily  . sodium chloride flush  3 mL Intravenous Q12H  . tamsulosin  0.4 mg Oral Q supper   Continuous Infusions: . sodium chloride     PRN Meds: sodium chloride, artificial tears, hydrocortisone, nitroGLYCERIN, ondansetron (ZOFRAN) IV, sodium chloride flush  Allergies:    Allergies  Allergen Reactions  . Ace Inhibitors Hives, Swelling and Rash    Rash,hives,tongue swelling  . Angiotensin Receptor Blockers     Unknown reaction   . Ciprofloxacin Other (See Comments)    confusion  . Oxycodone-Acetaminophen Other (See Comments)    Unknown reaction  . Robaxin [Methocarbamol] Other (See Comments)    Confusion   . Toradol [Ketorolac Tromethamine] Other (See Comments)    confusion  . Valium Other (See Comments)    Hallucinations; confusion  . Doxycycline Hives and Rash    Social History:   Social History   Socioeconomic History  . Marital status: Married    Spouse name: Dione   . Number of children: 1  . Years of education: HS  . Highest education level: High school graduate  Occupational History  . Occupation: RETIRED    Employer: [employer]    Comment: Power plant  Tobacco Use  . Smoking status: Former Smoker    Packs/day: 1.00    Years: 20.00  Pack years: 20.00    Types: Cigarettes    Start date: 01/18/1963    Quit date: 08/11/1988    Years since quitting: 31.2  . Smokeless tobacco: Former Systems developer    Types: Chew    Quit date: 01/11/1989  . Tobacco comment: chewed 1 pack tobacco/day for 15 years  Vaping Use  . Vaping Use: Never used  Substance and Sexual  Activity  . Alcohol use: No    Alcohol/week: 0.0 standard drinks  . Drug use: No  . Sexual activity: Yes  Other Topics Concern  . Not on file  Social History Narrative   No regular exercise      Daily caffeine use: 3 cups   Patient is right handed.    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 Difficulty of Paying Living Expenses: Not on file  Food Insecurity:   . Worried About Charity fundraiser in the Last Year: Not on file  . Ran Out of Food in the Last Year: Not on file  Transportation Needs:   . Lack of Transportation (Medical): Not on file  . Lack of Transportation (Non-Medical): Not on file  Physical Activity:   . Days of Exercise per Week: Not on file  . Minutes of Exercise per Session: Not on file  Stress:   . Feeling of Stress : Not on file  Social Connections:   . Frequency of Communication with Friends and Family: Not on file  . Frequency of Social Gatherings with Friends and Family: Not on file  . Attends Religious Services: Not on file  . Active Member of Clubs or Organizations: Not on file  . Attends Archivist Meetings: Not on file  . Marital Status: Not on file  Intimate Partner Violence:   . Fear of Current or Ex-Partner: Not on file  . Emotionally Abused: Not on file  . Physically Abused: Not on file  . Sexually Abused: Not on file    Family History:    Family History  Problem Relation Age of Onset  . Colon cancer Mother        Diagnosed age 77  . Cancer Mother 21  . Heart disease Father   . Heart attack Father   . Breast cancer Sister   . Heart disease Brother   . Brain cancer Sister   . Heart disease Brother   . Bladder Cancer Brother   . Cancer - Other Brother      ROS:  Please see the history of present illness.  All other ROS reviewed and negative.     Physical Exam/Data:   Vitals:    11/16/19 0100 11/16/19 0400 11/16/19 0420 11/16/19 0940  BP:  (!) 161/74  (!) 151/74  Pulse:  64  67  Resp: 16 20  20   Temp: 97.8 F (36.6 C)  97.7 F (36.5 C)   TempSrc: Axillary  Oral   SpO2:  95%  95%  Weight:   104.7 kg   Height:        Intake/Output Summary (Last 24 hours) at 11/16/2019 1006 Last data filed at 11/16/2019 0837 Gross per 24 hour  Intake 620 ml  Output 1725 ml  Net -1105 ml   Last 3 Weights 11/16/2019 11/15/2019 11/14/2019  Weight (lbs) 230 lb 14.4 oz 232 lb 230 lb  Weight (  kg) 104.736 kg 105.235 kg 104.327 kg     Body mass index is 37.27 kg/m.  General:  Well nourished, well developed, in no acute distress, morbid obesity HEENT: normal Lymph: no adenopathy Neck: no JVD Endocrine:  No thryomegaly Cardiac:  normal S1, S2; RRR; 3 out of 6 harsh, systolic ejection murmur Lungs:  clear to auscultation bilaterally, no wheezing, rhonchi or rales  Abd: soft, nontender, no hepatomegaly  Ext: no edema Musculoskeletal:  No deformities, BUE and BLE strength normal and equal Skin: warm and dry  Neuro:  CNs 2-12 intact, no focal abnormalities noted Psych:  Normal affect   EKG:  The EKG was personally reviewed and demonstrates: Sinus bradycardia Telemetry:  Telemetry was personally reviewed and demonstrates: Sinus  Relevant CV Studies: 11/14/19 RIGHT/LEFT HEART CATH AND CORONARY ANGIOGRAPHY  Conclusion    Prox RCA-1 lesion is 70% stenosed.  Prox RCA-2 lesion is 95% stenosed.  LPAV lesion is 90% stenosed.  Lat 3rd Mrg lesion is 80% stenosed.  Prox Cx to Mid Cx lesion is 30% stenosed.  Prox LAD to Mid LAD lesion is 99% stenosed.  Mid LAD lesion is 99% stenosed.  2nd Diag lesion is 90% stenosed.  1. Severe proximal LAD stenosis, calcified. Severe mid LAD stenosis at the takeoff of the moderate caliber diagonal branch, calcified.  2. Severe distal AV groove stenosis in the left dominant Circumflex. Severe OM 2 stenosis.  3. Moderate caliber  non-dominant RCA with patent proximal stent with moderately severe restenosis. Severe mid RCA stenosis.  4. Moderately severe to severe aortic stenosis (mean gradient 26.6 mmHg, peak to peak gradient 31 mmHg, AVA 1.06 cm2).   Recommendations: He has moderately severe to severe AS and three vessel CAD. I will admit him to telemetry. Given elevated pressures, will begin IV Lasix. I will ask our CT surgery team to see him to discuss CABG/surgical AVR vs staged PCI and TAVR. Will obtain pre-TAVR CT scans to fully assess his aorta and access.   In regards to bypass, the RCA is non-dominant. I think he would benefit from bypass of the LAD and potentially the OM branch and left sided PDA. If he is not felt to be a candidate for CABG, we would plan staged PCI of the LAD which would require atherectomy prior to stent placement. The LAD is calcified. The proximal LAD stenosis could be easily treated with orbital atherectomy and stenting. The mid LAD lesion is at the takeoff of a moderate caliber diagonal branch which also has a severe ostial stenosis. This would be higher risk in regards to potentially losing flow down that diagonal branch post stenting of the LAD.   He has mild memory issues but is overall functional at baseline, recently limited by dyspnea with exertion. He may be a candidate for open surgery with AVR and bypass.   __________________  Echo 10/31/19 IMPRESSIONS  1. Left ventricular ejection fraction, by estimation, is 60 to 65%. The  left ventricle has normal function. The left ventricle has no regional  wall motion abnormalities. There is mild left ventricular hypertrophy.  Left ventricular diastolic parameters  are indeterminate.  2. Right ventricular systolic function is normal. The right ventricular  size is normal. Tricuspid regurgitation signal is inadequate for assessing  PA pressure.  3. The mitral valve is grossly normal with mild annular calcification.  Trivial mitral  valve regurgitation.  4. The aortic valve is tricuspid. There is moderate calcification of the  aortic valve. Aortic valve regurgitation is not visualized.  Moderate to  severe aortic valve stenosis. Aortic valve mean gradient measures 23.2  mmHg. Aortic valve Vmax measures 3.21  m/s. Dimentionless index 0.28.  5. Aortic dilatation noted. There is borderline dilatation of the  ascending aorta, measuring 40 mm.  6. Unable to estimate CVP.   FINDINGS  Left Ventricle: Left ventricular ejection fraction, by estimation, is 60  to 65%. The left ventricle has normal function. The left ventricle has no  regional wall motion abnormalities. The left ventricular internal cavity  size was normal in size. There is  mild left ventricular hypertrophy. Left ventricular diastolic parameters  are indeterminate.   Right Ventricle: The right ventricular size is normal. No increase in  right ventricular wall thickness. Right ventricular systolic function is  normal. Tricuspid regurgitation signal is inadequate for assessing PA  pressure.   Left Atrium: Left atrial size was normal in size.   Right Atrium: Right atrial size was normal in size.   Pericardium: There is no evidence of pericardial effusion.   Mitral Valve: The mitral valve is grossly normal. Mild mitral annular  calcification. Trivial mitral valve regurgitation.   Tricuspid Valve: The tricuspid valve is grossly normal. Tricuspid valve  regurgitation is mild.   Aortic Valve: The aortic valve is tricuspid. There is moderate  calcification of the aortic valve. There is moderate aortic valve annular  calcification. Aortic valve regurgitation is not visualized. Moderate to  severe aortic stenosis is present. Aortic  valve mean gradient measures 23.2 mmHg. Aortic valve peak gradient  measures 41.2 mmHg. Aortic valve area, by VTI measures 0.98 cm.   Pulmonic Valve: The pulmonic valve was grossly normal. Pulmonic valve  regurgitation  is trivial.   Aorta: Aortic dilatation noted. There is borderline dilatation of the  ascending aorta, measuring 40 mm.   Venous: Unable to estimate CVP. IVC assessment for right atrial pressure  unable to be performed due to mechanical ventilation.   IAS/Shunts: The interatrial septum was not well visualized.     LEFT VENTRICLE  PLAX 2D  LVIDd:     5.37 cm   Diastology  LVIDs:     3.11 cm   LV e' medial:  6.87 cm/s  LV PW:     1.20 cm   LV E/e' medial: 10.9  LV IVS:    1.16 cm   LV e' lateral:  5.71 cm/s  LVOT diam:   2.10 cm   LV E/e' lateral: 13.2  LV SV:     56  LV SV Index:  26  LVOT Area:   3.46 cm    LV Volumes (MOD)  LV vol d, MOD A2C: 93.9 ml  LV vol d, MOD A4C: 120.0 ml  LV vol s, MOD A2C: 37.0 ml  LV vol s, MOD A4C: 50.0 ml  LV SV MOD A2C:   56.9 ml  LV SV MOD A4C:   120.0 ml  LV SV MOD BP:   64.7 ml   RIGHT VENTRICLE  RV S prime:   17.40 cm/s  TAPSE (M-mode): 2.3 cm   LEFT ATRIUM       Index  LA diam:    3.00 cm 1.39 cm/m  LA Vol (A2C):  41.9 ml 19.46 ml/m  LA Vol (A4C):  60.9 ml 28.28 ml/m  LA Biplane Vol:     23.90 ml/m  AORTIC VALVE  AV Area (Vmax):  1.01 cm  AV Area (Vmean):  1.03 cm  AV Area (VTI):   0.98 cm  AV Vmax:  320.80 cm/s  AV Vmean:     221.600 cm/s  AV VTI:      0.578 m  AV Peak Grad:   41.2 mmHg  AV Mean Grad:   23.2 mmHg  LVOT Vmax:     93.80 cm/s  LVOT Vmean:    66.200 cm/s  LVOT VTI:     0.163 m  LVOT/AV VTI ratio: 0.28    AORTA  Ao Root diam: 3.70 cm  Ao Asc diam: 4.00 cm   MITRAL VALVE        TRICUSPID VALVE  MV Area (PHT):       TR Peak grad:  7.0 mmHg  MV Decel Time: 209 msec  TR Vmax:    132.00 cm/s  MR Peak grad: 10.1 mmHg  MR Vmax:   159.00 cm/s SHUNTS  MV E velocity: 75.20 cm/s Systemic VTI: 0.16 m  MV A velocity: 85.90 cm/s Systemic Diam: 2.10 cm  MV E/A ratio:  0.88   _________________  Cardiac CT 11/15/19 IMPRESSION: 1. Tricuspid aortic valve with severe aortic stenosis (calcium score = 2012).  2. Bulky calcifications noted in the [place].  3. Annular measurements appropriate for 26 mm Edwards Sapien 3 TAVR.  4. No significant annular or subannular calcifications.  5. Sufficient coronary to annulus distance.  6. Optimal Fluoroscopic Angle for Delivery: RAO 3 CAU 6  7. Dilated pulmonary artery suggestive of pulmonary hypertension.  8. Severe 3-vessel coronary calcifications.  [place] T. O'Neal, MD  __________________  CT angio/chest/abd/pelvis 11/15/19 IMPRESSION: 1. Vascular findings and measurements pertinent to potential TAVR procedure, as detailed above. 2. Severe thickening calcification of the aortic valve, compatible with reported clinical history of severe aortic stenosis. 3. Aortic atherosclerosis, in addition to left main and 3 vessel coronary artery disease. Assessment for potential risk factor modification, dietary therapy or pharmacologic therapy may be warranted, if clinically indicated. 4. Calcified pleural plaques in the thorax bilaterally compatible with asbestos related pleural disease. 5. Additional incidental findings, as above.    Laboratory Data:  High Sensitivity Troponin:  No results for input(s): TROPONINIHS in the last 720 hours.   Chemistry Recent Labs  Lab 11/14/19 1108 11/15/19 1122 11/16/19 0206  NA 140 135 136  K 3.8 3.7 3.6  CL  --  100 99  CO2  --  24 25  GLUCOSE  --  256* 269*  BUN  --  16 16  CREATININE  --  1.18 1.17  CALCIUM  --  8.8* 8.9  GFRNONAA  --  >60 >60  ANIONGAP  --  11 12    No results for input(s): PROT, ALBUMIN, AST, ALT, ALKPHOS, BILITOT in the last 168 hours. Hematology Recent Labs  Lab 11/14/19 1103 11/14/19 1108 11/16/19 0206  WBC  --   --  6.9  RBC  --   --  4.93  HGB 12.9* 12.6* 13.7  HCT 38.0* 37.0* 42.2  MCV  --   --  85.6  MCH  --   --   27.8  MCHC  --   --  32.5  RDW  --   --  14.8  PLT  --   --  296   BNPNo results for input(s): BNP, PROBNP in the last 168 hours.  DDimer No results for input(s): DDIMER in the last 168 hours.   Radiology/Studies:  CARDIAC CATHETERIZATION  Result Date: 11/14/2019  Prox RCA-1 lesion is 70% stenosed.  Prox RCA-2 lesion is 95% stenosed.  LPAV lesion is 90% stenosed.  Lat  3rd Mrg lesion is 80% stenosed.  Prox Cx to Mid Cx lesion is 30% stenosed.  Prox LAD to Mid LAD lesion is 99% stenosed.  Mid LAD lesion is 99% stenosed.  2nd Diag lesion is 90% stenosed.  1. Severe proximal LAD stenosis, calcified. Severe mid LAD stenosis at the takeoff of the moderate caliber diagonal branch, calcified. 2. Severe distal AV groove stenosis in the left dominant Circumflex. Severe OM 2 stenosis. 3. Moderate caliber non-dominant RCA with patent proximal stent with moderately severe restenosis. Severe mid RCA stenosis. 4. Moderately severe to severe aortic stenosis (mean gradient 26.6 mmHg, peak to peak gradient 31 mmHg, AVA 1.06 cm2). Recommendations: He has moderately severe to severe AS and three vessel CAD. I will admit him to telemetry. Given elevated pressures, will begin IV Lasix. I will ask our CT surgery team to see him to discuss CABG/surgical AVR vs staged PCI and TAVR. Will obtain pre-TAVR CT scans to fully assess his aorta and access. In regards to bypass, the RCA is non-dominant. I think he would benefit from bypass of the LAD and potentially the OM branch and left sided PDA. If he is not felt to be a candidate for CABG, we would plan staged PCI of the LAD which would require atherectomy prior to stent placement. The LAD is calcified. The proximal LAD stenosis could be easily treated with orbital atherectomy and stenting. The mid LAD lesion is at the takeoff of a moderate caliber diagonal branch which also has a severe ostial stenosis. This would be higher risk in regards to potentially losing flow down  that diagonal branch post stenting of the LAD. He has mild memory issues but is overall functional at baseline, recently limited by dyspnea with exertion. He may be a candidate for open surgery with AVR and bypass.   CT CORONARY MORPH W/CTA COR W/SCORE W/CA W/CM &/OR WO/CM  Result Date: 11/16/2019 EXAM: OVER-READ INTERPRETATION  CT CHEST The following report is an over-read performed by radiologist Dr. Vinnie Langton of [facility], [place] on 11/16/2019. This over-read does not include interpretation of cardiac or coronary anatomy or pathology. The coronary calcium score/coronary CTA interpretation by the cardiologist is attached. COMPARISON:  Chest CT 12/18/2017. FINDINGS: Extracardiac findings will be described separately under dictation for contemporaneously obtained CTA chest, abdomen and pelvis. IMPRESSION: Please see separate dictation for contemporaneously obtained CTA chest, abdomen and pelvis 11/15/2019 for full description of relevant extracardiac findings. Electronically Signed   By: Vinnie Langton M.D.   On: 11/16/2019 07:29     STS Risk Calculator: Procedure: AVR + CAB Risk of Mortality: 3.506% Renal Failure: 5.241% Permanent Stroke: 2.876% Prolonged Ventilation: 11.333% DSW Infection: 0.528% Reoperation: 3.308% Morbidity or Mortality: 19.849% Short Length of Stay: 18.974% Long Length of Stay: 9.889%   Assessment and Plan:   JARMAN LITTON is a 79 y.o. male with symptoms of severe, stage D3 aortic stenosis with NYHA Class III symptoms, currently admitted with heart failure. I have reviewed the patient's recent echocardiogram which is notable for preserved LV systolic function and moderately severe aortic stenosis with peak gradient of 41.2 mmHg and mean transvalvular gradient of 23.2 mmHg. The patient's dimensionless index is 0.28 and calculated aortic valve area is 0.98 cm, SVI 26. L/RHC 11/14/2019 showed severe, calcified proximal LAD stenosis; severe,  calcified mid LAD stenosis at the takeoff of the moderate caliber diagonal branch; severe distal AV groove stenosis in the left dominant Circumflex; severe OM 2 stenosis, moderate caliber non-dominant RCA with patent  proximal stent with moderately severe restenosis, & severe mid RCA stenosis. Dr. Angelena Form felt he would benefit from bypass of the LAD and potentially the OM branch and left sided PDA. If he is not felt to be a candidate for CABG, we would plan staged PCI of the LAD which would require atherectomy prior to stent placement. Cardiac gated CTA of the heart reveals anatomical characteristics consistent with aortic stenosis suitable for treatment by transcatheter aortic valve replacement without any significant complicating features. However, there is bulky calcifications noted in the noncoronary cusp. CTA of the aorta and iliac vessels demonstrate what appear to be adequate pelvic vascular access to facilitate a transfemoral approach.     I have reviewed the natural history of aortic stenosis with the patient. We have discussed the limitations of medical therapy and the poor prognosis associated with symptomatic aortic stenosis. We have reviewed potential treatment options, including palliative medical therapy, conventional surgical aortic valve replacement, and transcatheter aortic valve replacement. We discussed treatment options in the context of this patient's specific comorbid medical conditions.    The patient's predicted risk of mortality with conventional aortic valve replacement is 3.506% primarily based on age, multivessel CAD, CHF, HTN, IDDM, morbid obesity. Other significant comorbid conditions include limited functional capacity and physical deconditioning.  The patient's coronary anatomy would probably be better suited for CABG, but I worry he would do poorly with open heart surgery.  The patient is eager to proceed with coronary revascularization and aortic valve replacement  with  whichever method Dr. Cyndia Bent recommends.  Dr. Cyndia Bent to follow.   Signed, Angelena Form, PA-C  11/16/2019 10:06 AM   Chart reviewed, patient examined, agree with above. This 79 year old gentleman has a 6 to 46-month history of progressive exertional shortness of breath and fatigue which is worsened recently and is markedly limiting his ability to be active.  He has had no chest pain or pressure.  He has had some swelling in his feet.  He reports some orthopnea.  He has been followed for moderate aortic stenosis and had an echocardiogram on 10/31/19 showing a mean gradient across the aortic valve of 23.2 mmHg and an aortic valve area of 0.98 cm.  Dimensionless index of 0.28.  Cardiac catheterization on 11/14/2019 showed severe multivessel coronary disease.  The LAD has a 99% mid vessel stenosis and a second 90% stenosis further downstream to the takeoff of a second diagonal branch.  The left circumflex has a large third marginal branch that had 80% stenosis in the lateral subbranch.  The right coronary artery is smaller and nondominant with 70% and 90% stenoses.  The mean gradient across aortic valve was 26.6 mmHg the peak to peak gradient of 31 mmHg.  Aortic valve area was 1.06 cm.  He underwent CT scan work-up for consideration of TAVR.  His gated cardiac CTA shows a trileaflet aortic valve with calcified leaflets and severely restricted mobility.  Annular measurement was suitable for a 26 mm Edwards SAPIEN 3 valve.  Abdominal and pelvic CTA shows adequate pelvic vascular anatomy to allow transfemoral insertion.  His echocardiogram data suggest moderate aortic stenosis although his valve is heavily calcified and has restricted mobility and looks like a severely stenotic valve.  His gated cardiac CTA confirms heavy calcification of the valve leaflets with restricted mobility and again it looks like a severely stenotic valve.  He presented with New [place] Heart Association class III symptoms of exertional  fatigue and shortness of breath with lower extremity edema consistent with  chronic diastolic congestive heart failure.  He has significantly improved with diuresis.  I think the best option for treating this patient is going to be with PCI of his LAD followed by staged TAVR.  His operative risk for open surgical AVR would be significantly increased due to his morbid obesity, chronic degenerative arthritis status post bilateral knee, hip, and shoulder replacements with the previous spine surgery, and other comorbidities including insulin-dependent diabetes, sleep apnea, and advanced age.  I discussed all this with the patient and his wife and answered all their questions.  The patient and his wife were counseled at length regarding treatment alternatives for management of severe symptomatic aortic stenosis. The risks and benefits of surgical intervention has been discussed in detail. Long-term prognosis with medical therapy was discussed. Alternative approaches such as conventional surgical aortic valve replacement, transcatheter aortic valve replacement, and palliative medical therapy were compared and contrasted at length. This discussion was placed in the context of the patient's own specific clinical presentation and past medical history. All of their questions have been addressed.   Following the decision to proceed with transcatheter aortic valve replacement, a discussion was held regarding what types of management strategies would be attempted intraoperatively in the event of life-threatening complications, including whether or not the patient would be considered a candidate for the use of cardiopulmonary bypass and/or conversion to open sternotomy for attempted surgical intervention.  I think he would be a candidate for emergent sternotomy to manage any intraoperative complications depending on the situation.  The patient is aware of the fact that transient use of cardiopulmonary bypass may be necessary.  The patient has been advised of a variety of complications that might develop including but not limited to risks of death, stroke, paravalvular leak, aortic dissection or other major vascular complications, aortic annulus rupture, device embolization, cardiac rupture or perforation, mitral regurgitation, acute myocardial infarction, arrhythmia, heart block or bradycardia requiring permanent pacemaker placement, congestive heart failure, respiratory failure, renal failure, pneumonia, infection, other late complications related to structural valve deterioration or migration, or other complications that might ultimately cause a temporary or permanent loss of functional independence or other long term morbidity. The patient provides full informed consent for the procedure as described and all questions were answered.   [name], MD

## 2019-11-16 NOTE — Progress Notes (Signed)
CARDIAC REHAB PHASE I   PRE:  Rate/Rhythm: 82 SR  BP:  Supine:   Sitting: 152/79  Standing:    SaO2: 100%RA  MODE:  Ambulation: 350 ft   POST:  Rate/Rhythm: 105 ST  BP:  Supine:   Sitting: 172/77  Standing:    SaO2: 95%RA 1124-1152 Pt walked 350 ft on RA with hand held asst . Gait fairly steady. C/o discomfort from hips and back. Has had 2 hip surgeries. Awaiting information regarding surgery. To sitting on side of bed after walk. Wife in room. No c/o CP. Sats good on RA.   Graylon Good, RN BSN  11/16/2019 11:49 AM

## 2019-11-16 NOTE — Progress Notes (Addendum)
[place] VALVE TEAM  Patient Name: JERRI HARGADON Date of Encounter: 11/16/2019  Primary Cardiologist: Dr. Jesse Sans Problem List     Active Problems:   Unstable angina [facility])   Nonrheumatic aortic valve stenosis   Subjective   Breathing much better with diuresis. Could walk down the hallway with more ease.   Inpatient Medications    Scheduled Meds: . acetaminophen  1,000 mg Oral Q8H  . amLODipine  5 mg Oral Daily  . aspirin EC  81 mg Oral Daily  . atorvastatin  40 mg Oral q1800  . docusate sodium  100 mg Oral BID WC  . donepezil  5 mg Oral QHS  . famotidine  20 mg Oral BID WC  . furosemide  40 mg Intravenous Q12H  . insulin aspart  15-20 Units Subcutaneous TID WC  . insulin glargine  62 Units Subcutaneous QHS  . isosorbide mononitrate  30 mg Oral Daily  . loratadine  10 mg Oral Q supper  . PARoxetine  20 mg Oral Daily  . sodium chloride flush  3 mL Intravenous Q12H  . tamsulosin  0.4 mg Oral Q supper   Continuous Infusions: . sodium chloride     PRN Meds: sodium chloride, artificial tears, hydrocortisone, nitroGLYCERIN, ondansetron (ZOFRAN) IV, sodium chloride flush   Vital Signs    Vitals:   11/16/19 0420 11/16/19 0940 11/16/19 1015 11/16/19 1100  BP:  (!) 151/74 (!) 173/75 (!) 172/77  Pulse:  67 62 67  Resp:  20 20 17   Temp: 97.7 F (36.5 C)  (!) 97.5 F (36.4 C) 97.7 F (36.5 C)  TempSrc: Oral  Oral Oral  SpO2:  95% 96% 96%  Weight: 104.7 kg     Height:        Intake/Output Summary (Last 24 hours) at 11/16/2019 1249 Last data filed at 11/16/2019 0837 Gross per 24 hour  Intake 620 ml  Output 1725 ml  Net -1105 ml   Filed Weights   11/14/19 0812 11/15/19 0339 11/16/19 0420  Weight: 104.3 kg 105.2 kg 104.7 kg    Physical Exam   GEN: Well nourished, well developed, in no acute distress. obese HEENT: Grossly normal.  Neck: Supple, no JVD, carotid bruits, or masses. Cardiac: RRR, loud,  harsh SEM.  No rubs, or gallops. No clubbing, cyanosis, edema.   Respiratory:  Respirations regular and unlabored, clear to auscultation bilaterally. GI: Soft, nontender, nondistended, BS + x 4. Obese abdomen  MS: no deformity or atrophy. Skin: warm and dry, no rash. Neuro:  Strength and sensation are intact. Psych: AAOx3.  Normal affect.  Labs    CBC Recent Labs    11/14/19 1108 11/16/19 0206  WBC  --  6.9  HGB 12.6* 13.7  HCT 37.0* 42.2  MCV  --  85.6  PLT  --  195   Basic Metabolic Panel Recent Labs    11/15/19 1122 11/16/19 0206  NA 135 136  K 3.7 3.6  CL 100 99  CO2 24 25  GLUCOSE 256* 269*  BUN 16 16  CREATININE 1.18 1.17  CALCIUM 8.8* 8.9   Liver Function Tests No results for input(s): AST, ALT, ALKPHOS, BILITOT, PROT, ALBUMIN in the last 72 hours. No results for input(s): LIPASE, AMYLASE in the last 72 hours. Cardiac Enzymes No results for input(s): CKTOTAL, CKMB, CKMBINDEX, TROPONINI in the last 72 hours. BNP Invalid input(s): POCBNP D-Dimer No results for input(s): DDIMER in the last 72 hours.  Hemoglobin A1C Recent Labs    11/14/19 1557  HGBA1C 7.6*   Fasting Lipid Panel Recent Labs    11/16/19 0206  CHOL 123  HDL 32*  LDLCALC 66  TRIG 125  CHOLHDL 3.8   Thyroid Function Tests No results for input(s): TSH, T4TOTAL, T3FREE, THYROIDAB in the last 72 hours.  Invalid input(s): FREET3  Telemetry    Sinus  - Personally Reviewed  ECG    Sinus brady - Personally Reviewed  Radiology    CT CORONARY MORPH W/CTA COR W/SCORE W/CA W/CM &/OR WO/CM  Addendum Date: 11/16/2019   ADDENDUM REPORT: 11/16/2019 11:10 CLINICAL DATA:  Severe Aortic Stenosis. EXAM: Cardiac TAVR CT TECHNIQUE: The patient was scanned on a [employer]. A 120 kV retrospective scan was triggered in the descending thoracic aorta at 111 HU's. Gantry rotation speed was 250 msecs and collimation was .6 mm. No beta blockade or nitro were given. The 3D data set was  reconstructed in 5% intervals of the R-R cycle. Systolic and diastolic phases were analyzed on a dedicated work station using MPR, MIP and VRT modes. The patient received 80 cc of contrast. FINDINGS: Image quality: Excellent. Noise artifact is: Limited. Valve Morphology: The aortic valve is tricuspid with severe thickening and severe calcifications. There NCC is contains diffuse bulky leaflet calcifications. There is limited movement in systole of the leaflets. Aortic Valve Calcium score: 2012 Aortic annular dimension: Phase assessed: 25% Annular area: 516 mm2 Annular perimeter: 81.7 mm Max diameter: 28.2 mm Min diameter: 24.2 mm Annular and subannular calcification: None. Optimal coplanar projection: RAO 3 CAU 6 Coronary Artery Height above Annulus: Left Main: 13.0 mm Right Coronary: 16.5 mm Sinus of Valsalva Measurements: Non-coronary: 39 mm Right-coronary: 38 mm Left-coronary: 37 mm Sinus of Valsalva Height: Non-coronary: 28.6 mm Right-coronary: 22.4 mm Left-coronary: 20.9 mm Sinotubular Junction: 37 mm. Ascending Thoracic Aorta: 40 mm. Coronary Arteries: Normal coronary origin. Left dominance. The study was performed without use of NTG and is insufficient for plaque evaluation. Please refer to recent cardiac catheterization for coronary assessment. Severe 3-vessel calcifications noted. Cardiac Morphology: Right Atrium: Right atrial size is within normal limits. Right Ventricle: The right ventricular cavity is within normal limits. Left Atrium: Left atrial size is normal in size with no left atrial appendage filling defect. Left Ventricle: The ventricular cavity size is within normal limits. There are no stigmata of prior infarction. There is no abnormal filling defect. Normal left ventricular function, LVEF=67%. No regional wall motion abnormalities. Pulmonary arteries: Dilated pulmonary artery suggestive of pulmonary hypertension. Pulmonary veins: Normal pulmonary venous drainage. Pericardium: Normal thickness  with no significant effusion or calcium present. Mitral Valve: The mitral valve is normal structure without significant calcification. Extra-cardiac findings: See attached radiology report for non-cardiac structures. IMPRESSION: 1. Tricuspid aortic valve with severe aortic stenosis (calcium score = 2012). 2. Bulky calcifications noted in the [place]. 3. Annular measurements appropriate for 26 mm Edwards Sapien 3 TAVR. 4. No significant annular or subannular calcifications. 5. Sufficient coronary to annulus distance. 6. Optimal Fluoroscopic Angle for Delivery: RAO 3 CAU 6 7. Dilated pulmonary artery suggestive of pulmonary hypertension. 8. Severe 3-vessel coronary calcifications. [place] T. Audie Box, MD Electronically Signed   By: Eleonore Chiquito   On: 11/16/2019 11:10   Result Date: 11/16/2019 EXAM: OVER-READ INTERPRETATION  CT CHEST The following report is an over-read performed by radiologist Dr. Vinnie Langton of [facility], [place] on 11/16/2019. This over-read does not include interpretation of cardiac or coronary anatomy or pathology. The coronary  calcium score/coronary CTA interpretation by the cardiologist is attached. COMPARISON:  Chest CT 12/18/2017. FINDINGS: Extracardiac findings will be described separately under dictation for contemporaneously obtained CTA chest, abdomen and pelvis. IMPRESSION: Please see separate dictation for contemporaneously obtained CTA chest, abdomen and pelvis 11/15/2019 for full description of relevant extracardiac findings. Electronically Signed: By: Vinnie Langton M.D. On: 11/16/2019 07:29   CT ANGIO CHEST AORTA W/CM & OR WO/CM  Result Date: 11/16/2019 CLINICAL DATA:  79 year old male with history of severe aortic stenosis. Preprocedural study prior to potential transcatheter aortic valve replacement (TAVR) procedure. EXAM: CT ANGIOGRAPHY CHEST, ABDOMEN AND PELVIS TECHNIQUE: Non-contrast CT of the chest was initially obtained. Multidetector CT imaging through the chest,  abdomen and pelvis was performed using the standard protocol during bolus administration of intravenous contrast. Multiplanar reconstructed images and MIPs were obtained and reviewed to evaluate the vascular anatomy. CONTRAST:  175mL OMNIPAQUE IOHEXOL 350 MG/ML SOLN COMPARISON:  Chest CT 12/18/2017. CT the abdomen and pelvis 03/11/2008. FINDINGS: CTA CHEST FINDINGS Cardiovascular: Heart size is normal. There is no significant pericardial fluid, thickening or pericardial calcification. There is aortic atherosclerosis, as well as atherosclerosis of the great vessels of the mediastinum and the coronary arteries, including calcified atherosclerotic plaque in the left main, left anterior descending, left circumflex and right coronary arteries. Severe thickening calcification of the aortic valve. Mediastinum/Lymph Nodes: No pathologically enlarged mediastinal or hilar lymph nodes. Esophagus is unremarkable in appearance. No axillary lymphadenopathy. Lungs/Pleura: Calcified pleural plaques in the thorax bilaterally, indicative of asbestos related pleural disease. No acute consolidative airspace disease. No pleural effusions. No suspicious appearing pulmonary nodules or masses are noted. Musculoskeletal/Soft Tissues: There are no aggressive appearing lytic or blastic lesions noted in the visualized portions of the skeleton. CTA ABDOMEN AND PELVIS FINDINGS Hepatobiliary: No suspicious cystic or solid hepatic lesions. No intra or extrahepatic biliary ductal dilatation. Status post cholecystectomy. Pancreas: No pancreatic mass. No pancreatic ductal dilatation. No pancreatic or peripancreatic fluid collections or inflammatory changes. Spleen: Unremarkable. Adrenals/Urinary Tract: Low-attenuation lesions in the kidneys bilaterally, largest of which are compatible with simple cysts, measuring up to 3.0 x 2.2 cm in the interpolar region of the left kidney. Other subcentimeter low-attenuation lesions in both kidneys, too small to  definitively characterize, but statistically likely to represent tiny cysts. No hydroureteronephrosis. Urinary bladder is unremarkable in appearance. Bilateral adrenal glands are normal in appearance. Stomach/Bowel: Normal appearance of the stomach. Small periampullary duodenal diverticulum, without surrounding inflammatory changes to suggest an associated diverticulitis at this time. No pathologic dilatation of small bowel or colon. Normal appendix. Vascular/Lymphatic: Aortic atherosclerosis, without evidence of aneurysm or dissection in the abdominal or pelvic vasculature. No lymphadenopathy noted in the abdomen or pelvis. Reproductive: Prostate gland and seminal vesicles are unremarkable in appearance. Other: No significant volume of ascites.  No pneumoperitoneum. Musculoskeletal: Status post bilateral hip arthroplasty. There are no aggressive appearing lytic or blastic lesions noted in the visualized portions of the skeleton. VASCULAR MEASUREMENTS PERTINENT TO TAVR: AORTA: Minimal Aortic Diameter-14 x 12 mm Severity of Aortic Calcification-severe RIGHT PELVIS: Right Common Iliac Artery - Minimal Diameter-10.5 x 8.5 mm Tortuosity-mild-to-moderate Calcification-moderate Right External Iliac Artery - Minimal Diameter-7.6 x 8.0 mm Tortuosity-moderate to severe Calcification-none Right Common Femoral Artery - Minimal Diameter-7.0 x 8.4 mm Tortuosity-mild Calcification-none LEFT PELVIS: Left Common Iliac Artery - Minimal Diameter-9.2 x 8.4 mm Tortuosity-mild-to-moderate Calcification-moderate Left External Iliac Artery - Minimal Diameter-8.8 x 7.6 mm Tortuosity-mild Calcification-none Left Common Femoral Artery - Minimal Diameter-8.5 x 7.9 mm Tortuosity-mild Calcification-none Review of  the MIP images confirms the above findings. IMPRESSION: 1. Vascular findings and measurements pertinent to potential TAVR procedure, as detailed above. 2. Severe thickening calcification of the aortic valve, compatible with reported  clinical history of severe aortic stenosis. 3. Aortic atherosclerosis, in addition to left main and 3 vessel coronary artery disease. Assessment for potential risk factor modification, dietary therapy or pharmacologic therapy may be warranted, if clinically indicated. 4. Calcified pleural plaques in the thorax bilaterally compatible with asbestos related pleural disease. 5. Additional incidental findings, as above. Electronically Signed   By: Vinnie Langton M.D.   On: 11/16/2019 11:09   CT Angio Abd/Pel w/ and/or w/o  Result Date: 11/16/2019 CLINICAL DATA:  79 year old male with history of severe aortic stenosis. Preprocedural study prior to potential transcatheter aortic valve replacement (TAVR) procedure. EXAM: CT ANGIOGRAPHY CHEST, ABDOMEN AND PELVIS TECHNIQUE: Non-contrast CT of the chest was initially obtained. Multidetector CT imaging through the chest, abdomen and pelvis was performed using the standard protocol during bolus administration of intravenous contrast. Multiplanar reconstructed images and MIPs were obtained and reviewed to evaluate the vascular anatomy. CONTRAST:  161mL OMNIPAQUE IOHEXOL 350 MG/ML SOLN COMPARISON:  Chest CT 12/18/2017. CT the abdomen and pelvis 03/11/2008. FINDINGS: CTA CHEST FINDINGS Cardiovascular: Heart size is normal. There is no significant pericardial fluid, thickening or pericardial calcification. There is aortic atherosclerosis, as well as atherosclerosis of the great vessels of the mediastinum and the coronary arteries, including calcified atherosclerotic plaque in the left main, left anterior descending, left circumflex and right coronary arteries. Severe thickening calcification of the aortic valve. Mediastinum/Lymph Nodes: No pathologically enlarged mediastinal or hilar lymph nodes. Esophagus is unremarkable in appearance. No axillary lymphadenopathy. Lungs/Pleura: Calcified pleural plaques in the thorax bilaterally, indicative of asbestos related pleural  disease. No acute consolidative airspace disease. No pleural effusions. No suspicious appearing pulmonary nodules or masses are noted. Musculoskeletal/Soft Tissues: There are no aggressive appearing lytic or blastic lesions noted in the visualized portions of the skeleton. CTA ABDOMEN AND PELVIS FINDINGS Hepatobiliary: No suspicious cystic or solid hepatic lesions. No intra or extrahepatic biliary ductal dilatation. Status post cholecystectomy. Pancreas: No pancreatic mass. No pancreatic ductal dilatation. No pancreatic or peripancreatic fluid collections or inflammatory changes. Spleen: Unremarkable. Adrenals/Urinary Tract: Low-attenuation lesions in the kidneys bilaterally, largest of which are compatible with simple cysts, measuring up to 3.0 x 2.2 cm in the interpolar region of the left kidney. Other subcentimeter low-attenuation lesions in both kidneys, too small to definitively characterize, but statistically likely to represent tiny cysts. No hydroureteronephrosis. Urinary bladder is unremarkable in appearance. Bilateral adrenal glands are normal in appearance. Stomach/Bowel: Normal appearance of the stomach. Small periampullary duodenal diverticulum, without surrounding inflammatory changes to suggest an associated diverticulitis at this time. No pathologic dilatation of small bowel or colon. Normal appendix. Vascular/Lymphatic: Aortic atherosclerosis, without evidence of aneurysm or dissection in the abdominal or pelvic vasculature. No lymphadenopathy noted in the abdomen or pelvis. Reproductive: Prostate gland and seminal vesicles are unremarkable in appearance. Other: No significant volume of ascites.  No pneumoperitoneum. Musculoskeletal: Status post bilateral hip arthroplasty. There are no aggressive appearing lytic or blastic lesions noted in the visualized portions of the skeleton. VASCULAR MEASUREMENTS PERTINENT TO TAVR: AORTA: Minimal Aortic Diameter-14 x 12 mm Severity of Aortic  Calcification-severe RIGHT PELVIS: Right Common Iliac Artery - Minimal Diameter-10.5 x 8.5 mm Tortuosity-mild-to-moderate Calcification-moderate Right External Iliac Artery - Minimal Diameter-7.6 x 8.0 mm Tortuosity-moderate to severe Calcification-none Right Common Femoral Artery - Minimal Diameter-7.0 x 8.4 mm Tortuosity-mild Calcification-none  LEFT PELVIS: Left Common Iliac Artery - Minimal Diameter-9.2 x 8.4 mm Tortuosity-mild-to-moderate Calcification-moderate Left External Iliac Artery - Minimal Diameter-8.8 x 7.6 mm Tortuosity-mild Calcification-none Left Common Femoral Artery - Minimal Diameter-8.5 x 7.9 mm Tortuosity-mild Calcification-none Review of the MIP images confirms the above findings. IMPRESSION: 1. Vascular findings and measurements pertinent to potential TAVR procedure, as detailed above. 2. Severe thickening calcification of the aortic valve, compatible with reported clinical history of severe aortic stenosis. 3. Aortic atherosclerosis, in addition to left main and 3 vessel coronary artery disease. Assessment for potential risk factor modification, dietary therapy or pharmacologic therapy may be warranted, if clinically indicated. 4. Calcified pleural plaques in the thorax bilaterally compatible with asbestos related pleural disease. 5. Additional incidental findings, as above. Electronically Signed   By: Vinnie Langton M.D.   On: 11/16/2019 11:09    Cardiac Studies   11/14/19 RIGHT/LEFT HEART CATH AND CORONARY ANGIOGRAPHY  Conclusion    Prox RCA-1 lesion is 70% stenosed.  Prox RCA-2 lesion is 95% stenosed.  LPAV lesion is 90% stenosed.  Lat 3rd Mrg lesion is 80% stenosed.  Prox Cx to Mid Cx lesion is 30% stenosed.  Prox LAD to Mid LAD lesion is 99% stenosed.  Mid LAD lesion is 99% stenosed.  2nd Diag lesion is 90% stenosed.   1. Severe proximal LAD stenosis, calcified. Severe mid LAD stenosis at the takeoff of the moderate caliber diagonal branch, calcified.  2.  Severe distal AV groove stenosis in the left dominant Circumflex. Severe OM 2 stenosis.  3. Moderate caliber non-dominant RCA with patent proximal stent with moderately severe restenosis. Severe mid RCA stenosis.  4. Moderately severe to severe aortic stenosis (mean gradient 26.6 mmHg, peak to peak gradient 31 mmHg, AVA 1.06 cm2).   Recommendations: He has moderately severe to severe AS and three vessel CAD. I will admit him to telemetry. Given elevated pressures, will begin IV Lasix. I will ask our CT surgery team to see him to discuss CABG/surgical AVR vs staged PCI and TAVR. Will obtain pre-TAVR CT scans to fully assess his aorta and access.   In regards to bypass, the RCA is non-dominant. I think he would benefit from bypass of the LAD and potentially the OM branch and left sided PDA. If he is not felt to be a candidate for CABG, we would plan staged PCI of the LAD which would require atherectomy prior to stent placement. The LAD is calcified. The proximal LAD stenosis could be easily treated with orbital atherectomy and stenting. The mid LAD lesion is at the takeoff of a moderate caliber diagonal branch which also has a severe ostial stenosis. This would be higher risk in regards to potentially losing flow down that diagonal branch post stenting of the LAD.   He has mild memory issues but is overall functional at baseline, recently limited by dyspnea with exertion. He may be a candidate for open surgery with AVR and bypass.   __________________  Echo 10/31/19 IMPRESSIONS  1. Left ventricular ejection fraction, by estimation, is 60 to 65%. The  left ventricle has normal function. The left ventricle has no regional  wall motion abnormalities. There is mild left ventricular hypertrophy.  Left ventricular diastolic parameters  are indeterminate.  2. Right ventricular systolic function is normal. The right ventricular  size is normal. Tricuspid regurgitation signal is inadequate for  assessing  PA pressure.  3. The mitral valve is grossly normal with mild annular calcification.  Trivial mitral valve regurgitation.  4. The  aortic valve is tricuspid. There is moderate calcification of the  aortic valve. Aortic valve regurgitation is not visualized. Moderate to  severe aortic valve stenosis. Aortic valve mean gradient measures 23.2  mmHg. Aortic valve Vmax measures 3.21  m/s. Dimentionless index 0.28.  5. Aortic dilatation noted. There is borderline dilatation of the  ascending aorta, measuring 40 mm.  6. Unable to estimate CVP.   FINDINGS  Left Ventricle: Left ventricular ejection fraction, by estimation, is 60  to 65%. The left ventricle has normal function. The left ventricle has no  regional wall motion abnormalities. The left ventricular internal cavity  size was normal in size. There is  mild left ventricular hypertrophy. Left ventricular diastolic parameters  are indeterminate.   Right Ventricle: The right ventricular size is normal. No increase in  right ventricular wall thickness. Right ventricular systolic function is  normal. Tricuspid regurgitation signal is inadequate for assessing PA  pressure.   Left Atrium: Left atrial size was normal in size.   Right Atrium: Right atrial size was normal in size.   Pericardium: There is no evidence of pericardial effusion.   Mitral Valve: The mitral valve is grossly normal. Mild mitral annular  calcification. Trivial mitral valve regurgitation.   Tricuspid Valve: The tricuspid valve is grossly normal. Tricuspid valve  regurgitation is mild.   Aortic Valve: The aortic valve is tricuspid. There is moderate  calcification of the aortic valve. There is moderate aortic valve annular  calcification. Aortic valve regurgitation is not visualized. Moderate to  severe aortic stenosis is present. Aortic  valve mean gradient measures 23.2 mmHg. Aortic valve peak gradient  measures 41.2 mmHg. Aortic valve area,  by VTI measures 0.98 cm.   Pulmonic Valve: The pulmonic valve was grossly normal. Pulmonic valve  regurgitation is trivial.   Aorta: Aortic dilatation noted. There is borderline dilatation of the  ascending aorta, measuring 40 mm.   Venous: Unable to estimate CVP. IVC assessment for right atrial pressure  unable to be performed due to mechanical ventilation.   IAS/Shunts: The interatrial septum was not well visualized.     LEFT VENTRICLE  PLAX 2D  LVIDd:     5.37 cm   Diastology  LVIDs:     3.11 cm   LV e' medial:  6.87 cm/s  LV PW:     1.20 cm   LV E/e' medial: 10.9  LV IVS:    1.16 cm   LV e' lateral:  5.71 cm/s  LVOT diam:   2.10 cm   LV E/e' lateral: 13.2  LV SV:     56  LV SV Index:  26  LVOT Area:   3.46 cm    LV Volumes (MOD)  LV vol d, MOD A2C: 93.9 ml  LV vol d, MOD A4C: 120.0 ml  LV vol s, MOD A2C: 37.0 ml  LV vol s, MOD A4C: 50.0 ml  LV SV MOD A2C:   56.9 ml  LV SV MOD A4C:   120.0 ml  LV SV MOD BP:   64.7 ml   RIGHT VENTRICLE  RV S prime:   17.40 cm/s  TAPSE (M-mode): 2.3 cm   LEFT ATRIUM       Index  LA diam:    3.00 cm 1.39 cm/m  LA Vol (A2C):  41.9 ml 19.46 ml/m  LA Vol (A4C):  60.9 ml 28.28 ml/m  LA Biplane Vol:     23.90 ml/m  AORTIC VALVE  AV Area (Vmax):  1.01 cm  AV Area (Vmean):  1.03 cm  AV Area (VTI):   0.98 cm  AV Vmax:      320.80 cm/s  AV Vmean:     221.600 cm/s  AV VTI:      0.578 m  AV Peak Grad:   41.2 mmHg  AV Mean Grad:   23.2 mmHg  LVOT Vmax:     93.80 cm/s  LVOT Vmean:    66.200 cm/s  LVOT VTI:     0.163 m  LVOT/AV VTI ratio: 0.28    AORTA  Ao Root diam: 3.70 cm  Ao Asc diam: 4.00 cm   MITRAL VALVE        TRICUSPID VALVE  MV Area (PHT):       TR Peak grad:  7.0 mmHg  MV Decel Time: 209 msec  TR Vmax:    132.00 cm/s  MR Peak grad: 10.1 mmHg  MR Vmax:   159.00 cm/s SHUNTS  MV  E velocity: 75.20 cm/s Systemic VTI: 0.16 m  MV A velocity: 85.90 cm/s Systemic Diam: 2.10 cm  MV E/A ratio: 0.88   Rozann Lesches MD  Electronically signed by Rozann Lesches MD  Signature Date/Time: 10/31/2019/11:33:28 AM    ____________________  Cardiac CT 11/15/19 IMPRESSION: 1. Tricuspid aortic valve with severe aortic stenosis (calcium score = 2012).  2. Bulky calcifications noted in the [place].  3. Annular measurements appropriate for 26 mm Edwards Sapien 3 TAVR.  4. No significant annular or subannular calcifications.  5. Sufficient coronary to annulus distance.  6. Optimal Fluoroscopic Angle for Delivery: RAO 3 CAU 6  7. Dilated pulmonary artery suggestive of pulmonary hypertension.  8. Severe 3-vessel coronary calcifications.  [place] T. O'Neal, MD  __________________  CT angio/chest/abd/pelvis 11/15/19 VASCULAR MEASUREMENTS PERTINENT TO TAVR:  AORTA:  Minimal Aortic Diameter-14 x 12 mm  Severity of Aortic Calcification-severe  RIGHT PELVIS:  Right Common Iliac Artery -  Minimal Diameter-10.5 x 8.5 mm  Tortuosity-mild-to-moderate  Calcification-moderate  Right External Iliac Artery -  Minimal Diameter-7.6 x 8.0 mm  Tortuosity-moderate to severe  Calcification-none  Right Common Femoral Artery -  Minimal Diameter-7.0 x 8.4 mm  Tortuosity-mild  Calcification-none  LEFT PELVIS:  Left Common Iliac Artery -  Minimal Diameter-9.2 x 8.4 mm  Tortuosity-mild-to-moderate  Calcification-moderate  Left External Iliac Artery -  Minimal Diameter-8.8 x 7.6 mm  Tortuosity-mild  Calcification-none  Left Common Femoral Artery -  Minimal Diameter-8.5 x 7.9 mm  Tortuosity-mild  Calcification-none  Review of the MIP images confirms the above findings.  IMPRESSION: 1. Vascular findings and measurements pertinent to potential TAVR procedure, as detailed above. 2. Severe thickening  calcification of the aortic valve, compatible with reported clinical history of severe aortic stenosis. 3. Aortic atherosclerosis, in addition to left main and 3 vessel coronary artery disease. Assessment for potential risk factor modification, dietary therapy or pharmacologic therapy may be warranted, if clinically indicated. 4. Calcified pleural plaques in the thorax bilaterally compatible with asbestos related pleural disease. 5. Additional incidental findings, as above.  Patient Profile     HALEEM HANNER is a 79 y.o. male with a history of CAD s/p BMS to RCA (2006), HTN, GERD, HLD, sleep apnea, IDDM and moderate to severe aortic stenosis undergoing TAVR work up who presented to [facility] on 11/14/19 for planned diagnostic cath. Cath showed elevated filling pressures and multivessel CAD. He was admitted for IV diuresis and CT surgery consult to discuss CABG/surgical AVR vs staged PCI and TAVR.  Assessment & Plan  Acute on chronic diastolic CHF: started on IV lasix 40mg  BID. Net neg 2.6L, weights appear to be inaccurate. Creat has remained stable. Will give one more dose of IV lasix today and convert to po tomorrow.   Moderate to severe AS: echo 10/31/19 showed LVEF 60-65%, mild LVH, moderate-severe AS with a mean gradient 23.2 mmHg, peak gradient 41.2 mmHg, AVA 0.98 cm2, DVI 0.28. His echo parameters suggest moderate to severe AS.Given the patient's progressive exertional dyspnea, TAVR work up was initiated. Pre TAVR cath data: mean gradient 26.6 mmHg, peak to peak gradient 31 mmHg, AVA 1.06 cm2. Cardiac gated CTA of the heart reveals anatomical characteristics consistent with aortic stenosis suitable for treatment by transcatheter aortic valve replacement without any significant complicating features and CTA of the aorta and iliac vessels demonstrate what appear to be adequate pelvic vascular access to facilitate a transfemoral approach. Will wait for TCTS consult to decide on TAVR vs SAVR  based on multivessel CAD.   Multivessel CAD with unstable angina: patient is s/p left heart cath showing significant multivessel disease as outlined above. Dr. Cyndia Bent to see today to decide if he is a surgical candidate. If patient is not a candidate for surgery, than he will need staged PCI with TAVR.  DMT2: continue SSI   HTN: BP has been elevated. Will increase norvasc from 5mg  to 10mg  daily. Continue imdur 30mg  daily.   SignedAngelena Form, PA-C  11/16/2019, 12:49 PM  Pager [phone]   Patient seen, examined. Available data reviewed. Agree with findings, assessment, and plan as outlined by Nell Range, PA-C.  The patient is independently interviewed and examined.  His wife is at the bedside.  On my exam, he is alert, oriented, in no distress.  JVP is normal, lungs are clear bilaterally, heart is regular rate and rhythm with a harsh 3/6 crescendo decrescendo murmur at the right upper sternal border with an audible A2.  Abdomen is soft, obese, and nontender.  Extremities have no edema.  The patient CTA studies are completed and are reviewed this afternoon.  He does appear to have transfemoral access for TAVR and suitable aortic valve anatomy.  I have also reviewed his coronary angiogram films.  If he is felt to be a poor candidate for CABG/AVR, it would be reasonable to proceed with atherectomy and stenting of the LAD as outlined in Dr. Camillia Herter cardiac catheterization note.  Dr. Cyndia Bent will evaluate the patient this afternoon and we will make further plans based on his final recommendations.  From a perspective of his congestive heart failure, the patient is clinically improved with improvement in his shortness of breath.  He has had no anginal symptoms.  While his aortic stenosis is in the moderately severe range, I have personally reviewed his echo and feel that his aortic valve is severely restricted.  His valve area calculates to approximately 1 cm.  However, with a combination of  aortic stenosis and coronary artery disease and now his clinical presentation with class III symptoms and very high filling pressures, I think proceeding with definitive treatment of his aortic stenosis is indicated.  Sherren Mocha, M.D. 11/16/2019 4:11 PM  Addendum: Dr. Vivi Martens consultation note is reviewed and personally discussed with him.  We will plan on proceeding with atherectomy and PCI of the LAD next week.  I have personally reviewed the risks, indications, and alternatives with the patient.  He understands that this will be performed in staged procedure prior to TAVR.  We will arrange with Dr. Angelena Form  who saw him in formal consultation and performed his diagnostic cardiac catheterization.  The patient prefers to do this later next week if possible.  We will go ahead and start him on clopidogrel 75 mg daily today.  The patient is medically stable for discharge from the hospital today.  We will reach out to our preprocedure nursing staff to set up Covid testing prior to elective outpatient PCI next week.  Sherren Mocha 11/16/2019 6:24 PM

## 2019-11-16 NOTE — Discharge Summary (Addendum)
Discharge Summary    Patient ID: [name] MRN: [id number]; DOB: 06/11/1940  Admit date: 11/14/2019 Discharge date: 11/16/2019  Primary Care Provider: Dettinger, Fransisca Kaufmann, MD  Primary Cardiologist: Rozann Lesches, MD  Primary Electrophysiologist:  None  Structural Heart: [name], MD   Discharge Diagnoses    Active Problems:   Unstable angina [facility])   Nonrheumatic aortic valve stenosis   Allergies Allergies  Allergen Reactions  . Ace Inhibitors Hives, Swelling and Rash    Rash,hives,tongue swelling  . Angiotensin Receptor Blockers     Unknown reaction   . Ciprofloxacin Other (See Comments)    confusion  . Oxycodone-Acetaminophen Other (See Comments)    Unknown reaction  . Robaxin [Methocarbamol] Other (See Comments)    Confusion   . Toradol [Ketorolac Tromethamine] Other (See Comments)    confusion  . Valium Other (See Comments)    Hallucinations; confusion  . Doxycycline Hives and Rash    Diagnostic Studies/Procedures    R/L CARDIAC CATH: 11/14/2019  Prox RCA-1 lesion is 70% stenosed.  Prox RCA-2 lesion is 95% stenosed.  LPAV lesion is 90% stenosed.  Lat 3rd Mrg lesion is 80% stenosed.  Prox Cx to Mid Cx lesion is 30% stenosed.  Prox LAD to Mid LAD lesion is 99% stenosed.  Mid LAD lesion is 99% stenosed.  2nd Diag lesion is 90% stenosed.   1. Severe proximal LAD stenosis, calcified. Severe mid LAD stenosis at the takeoff of the moderate caliber diagonal branch, calcified.  2. Severe distal AV groove stenosis in the left dominant Circumflex. Severe OM 2 stenosis.  3. Moderate caliber non-dominant RCA with patent proximal stent with moderately severe restenosis. Severe mid RCA stenosis.  4. Moderately severe to severe aortic stenosis (mean gradient 26.6 mmHg, peak to peak gradient 31 mmHg, AVA 1.06 cm2).   Recommendations: He has moderately severe to severe AS and three vessel CAD. I will admit him to telemetry. Given elevated  pressures, will begin IV Lasix. I will ask our CT surgery team to see him to discuss CABG/surgical AVR vs staged PCI and TAVR. Will obtain pre-TAVR CT scans to fully assess his aorta and access.   In regards to bypass, the RCA is non-dominant. I think he would benefit from bypass of the LAD and potentially the OM branch and left sided PDA. If he is not felt to be a candidate for CABG, we would plan staged PCI of the LAD which would require atherectomy prior to stent placement. The LAD is calcified. The proximal LAD stenosis could be easily treated with orbital atherectomy and stenting. The mid LAD lesion is at the takeoff of a moderate caliber diagonal branch which also has a severe ostial stenosis. This would be higher risk in regards to potentially losing flow down that diagonal branch post stenting of the LAD.   He has mild memory issues but is overall functional at baseline, recently limited by dyspnea with exertion. He may be a candidate for open surgery with AVR and bypass.   RADIOLOGY RESULTS:  See below _____________   History of Present Illness     [name] is a 79 y.o. male with history of CAD, HTN, GERD, hyperlipidemia, sleep apnea, DM on insulin and aortic stenosis who is being admitted today post cath. I saw him in the office recently for further discussion regarding his aortic stenosis. He is known to have CAD with last cardiac cath in 2006 at which time a bare metal stent was placed in the  RCA. No cardiac cath since then. He has been followed for moderate aortic stenosis. Most recent echocardiogram 10/31/19 with LVEF=60-65%, mild LVH. The aortic valve leaflets are thickened and calcified with limited leaflet excursion. Mean gradient 23.2 mmHg, peak gradient 41.2 mmHg, AVA 0.98 cm2, dimensionless index 0.28. His echo parameters suggest moderate to severe AS.He was seen recently by Dr. Domenic Polite and described worsened dyspnea on exertion and progressive fatigue. He has had no  chest pain.He told me that hecan walk 50 yards before becoming short of breath. He rests and then can walk again. He denies chest pain, dizziness, near syncope, syncope or lower extremity edema. He is retired. He worked at [employer] but has been retired for 24 years. He lives near [place], [place] with his wife. He has full dentures on the top and a bridge on the bottom. No active dental issues.   He was seen by Dr Angelena Form as an outpatient and came in for cardiac cath 11/03.   Cardiac cath showed three vessel CAD, elevated filling pressures and moderately severe AS. He was admitted after the cath for further evaluation.  Hospital Course     Consultants: Dr Cyndia Bent  He was felt to be either a candidate for CABG/AVR or PCI/TAVR.  He had evaluation of his aortic valve with a CT of his chest abdomen and pelvis plus a cardiac CT.  Those results are below.  He tolerated the procedures well.  However, he was volume overloaded and received IV Lasix during his admission. His respiratory status improved, he did not require supplemental oxygen at discharge. Intake/output were negative by 2.5 L and his discharge weight was 230 pounds.  Dr. Cyndia Bent saw [name] and reviewed all data.  He discussed the situation thoroughly with Mr. Bail and his wife.  The decision was made to proceed with PCI of the LAD followed by staged TAVR. His operative risk for open surgical AVR would be significantly increased due to his morbid obesity, chronic degenerative arthritis status post bilateral knee, hip, and shoulder replacements with the previous spine surgery, and other comorbidities including insulin-dependent diabetes, sleep apnea, and advanced age.  This was discussed with the patient and his wife and all their questions answered.  Once the decision was made to proceed with PCI and then staged TAVR, Dr. Burt Knack reviewed all data and felt that [name] could be safely discharged home, to return for the planned  procedures as an outpatient.  No further inpatient work-up is indicated and he is considered stable for discharge, with scheduled follow-up return at 10 AM on 11/11 for a 12 noon procedure.  _____________  Discharge Vitals Blood pressure (!) 163/83, pulse 62, temperature 97.7 F (36.5 C), temperature source Oral, resp. rate 14, height 5\' 6"  (1.676 m), weight 104.7 kg, SpO2 96 %.  Filed Weights   11/14/19 0812 11/15/19 0339 11/16/19 0420  Weight: 104.3 kg 105.2 kg 104.7 kg    Labs & Radiologic Studies    CBC Recent Labs    11/14/19 1108 11/16/19 0206  WBC  --  6.9  HGB 12.6* 13.7  HCT 37.0* 42.2  MCV  --  85.6  PLT  --  347   Basic Metabolic Panel Recent Labs    11/15/19 1122 11/16/19 0206  NA 135 136  K 3.7 3.6  CL 100 99  CO2 24 25  GLUCOSE 256* 269*  BUN 16 16  CREATININE 1.18 1.17  CALCIUM 8.8* 8.9   Hemoglobin A1C Recent Labs  11/14/19 1557  HGBA1C 7.6*   Fasting Lipid Panel Recent Labs    11/16/19 0206  CHOL 123  HDL 32*  LDLCALC 66  TRIG 125  CHOLHDL 3.8   _____________  CARDIAC CATHETERIZATION  Result Date: 11/14/2019  Prox RCA-1 lesion is 70% stenosed.  Prox RCA-2 lesion is 95% stenosed.  LPAV lesion is 90% stenosed.  Lat 3rd Mrg lesion is 80% stenosed.  Prox Cx to Mid Cx lesion is 30% stenosed.  Prox LAD to Mid LAD lesion is 99% stenosed.  Mid LAD lesion is 99% stenosed.  2nd Diag lesion is 90% stenosed.  1. Severe proximal LAD stenosis, calcified. Severe mid LAD stenosis at the takeoff of the moderate caliber diagonal branch, calcified. 2. Severe distal AV groove stenosis in the left dominant Circumflex. Severe OM 2 stenosis. 3. Moderate caliber non-dominant RCA with patent proximal stent with moderately severe restenosis. Severe mid RCA stenosis. 4. Moderately severe to severe aortic stenosis (mean gradient 26.6 mmHg, peak to peak gradient 31 mmHg, AVA 1.06 cm2). Recommendations: He has moderately severe to severe AS and three vessel  CAD. I will admit him to telemetry. Given elevated pressures, will begin IV Lasix. I will ask our CT surgery team to see him to discuss CABG/surgical AVR vs staged PCI and TAVR. Will obtain pre-TAVR CT scans to fully assess his aorta and access. In regards to bypass, the RCA is non-dominant. I think he would benefit from bypass of the LAD and potentially the OM branch and left sided PDA. If he is not felt to be a candidate for CABG, we would plan staged PCI of the LAD which would require atherectomy prior to stent placement. The LAD is calcified. The proximal LAD stenosis could be easily treated with orbital atherectomy and stenting. The mid LAD lesion is at the takeoff of a moderate caliber diagonal branch which also has a severe ostial stenosis. This would be higher risk in regards to potentially losing flow down that diagonal branch post stenting of the LAD. He has mild memory issues but is overall functional at baseline, recently limited by dyspnea with exertion. He may be a candidate for open surgery with AVR and bypass.   CT CORONARY MORPH W/CTA COR W/SCORE W/CA W/CM &/OR WO/CM  Addendum Date: 11/16/2019   ADDENDUM REPORT: 11/16/2019 11:10 CLINICAL DATA:  Severe Aortic Stenosis. EXAM: Cardiac TAVR CT TECHNIQUE: The patient was scanned on a [employer]. A 120 kV retrospective scan was triggered in the descending thoracic aorta at 111 HU's. Gantry rotation speed was 250 msecs and collimation was .6 mm. No beta blockade or nitro were given. The 3D data set was reconstructed in 5% intervals of the R-R cycle. Systolic and diastolic phases were analyzed on a dedicated work station using MPR, MIP and VRT modes. The patient received 80 cc of contrast. FINDINGS: Image quality: Excellent. Noise artifact is: Limited. Valve Morphology: The aortic valve is tricuspid with severe thickening and severe calcifications. There NCC is contains diffuse bulky leaflet calcifications. There is limited movement in  systole of the leaflets. Aortic Valve Calcium score: 2012 Aortic annular dimension: Phase assessed: 25% Annular area: 516 mm2 Annular perimeter: 81.7 mm Max diameter: 28.2 mm Min diameter: 24.2 mm Annular and subannular calcification: None. Optimal coplanar projection: RAO 3 CAU 6 Coronary Artery Height above Annulus: Left Main: 13.0 mm Right Coronary: 16.5 mm Sinus of Valsalva Measurements: Non-coronary: 39 mm Right-coronary: 38 mm Left-coronary: 37 mm Sinus of Valsalva Height: Non-coronary: 28.6 mm Right-coronary: 22.4  mm Left-coronary: 20.9 mm Sinotubular Junction: 37 mm. Ascending Thoracic Aorta: 40 mm. Coronary Arteries: Normal coronary origin. Left dominance. The study was performed without use of NTG and is insufficient for plaque evaluation. Please refer to recent cardiac catheterization for coronary assessment. Severe 3-vessel calcifications noted. Cardiac Morphology: Right Atrium: Right atrial size is within normal limits. Right Ventricle: The right ventricular cavity is within normal limits. Left Atrium: Left atrial size is normal in size with no left atrial appendage filling defect. Left Ventricle: The ventricular cavity size is within normal limits. There are no stigmata of prior infarction. There is no abnormal filling defect. Normal left ventricular function, LVEF=67%. No regional wall motion abnormalities. Pulmonary arteries: Dilated pulmonary artery suggestive of pulmonary hypertension. Pulmonary veins: Normal pulmonary venous drainage. Pericardium: Normal thickness with no significant effusion or calcium present. Mitral Valve: The mitral valve is normal structure without significant calcification. Extra-cardiac findings: See attached radiology report for non-cardiac structures. IMPRESSION: 1. Tricuspid aortic valve with severe aortic stenosis (calcium score = 2012). 2. Bulky calcifications noted in the [place]. 3. Annular measurements appropriate for 26 mm Edwards Sapien 3 TAVR. 4. No significant  annular or subannular calcifications. 5. Sufficient coronary to annulus distance. 6. Optimal Fluoroscopic Angle for Delivery: RAO 3 CAU 6 7. Dilated pulmonary artery suggestive of pulmonary hypertension. 8. Severe 3-vessel coronary calcifications. [place] T. Audie Box, MD Electronically Signed   By: Eleonore Chiquito   On: 11/16/2019 11:10   Result Date: 11/16/2019 EXAM: OVER-READ INTERPRETATION  CT CHEST The following report is an over-read performed by radiologist Dr. Vinnie Langton of [facility], [place] on 11/16/2019. This over-read does not include interpretation of cardiac or coronary anatomy or pathology. The coronary calcium score/coronary CTA interpretation by the cardiologist is attached. COMPARISON:  Chest CT 12/18/2017. FINDINGS: Extracardiac findings will be described separately under dictation for contemporaneously obtained CTA chest, abdomen and pelvis. IMPRESSION: Please see separate dictation for contemporaneously obtained CTA chest, abdomen and pelvis 11/15/2019 for full description of relevant extracardiac findings. Electronically Signed: By: Vinnie Langton M.D. On: 11/16/2019 07:29   CT ANGIO CHEST AORTA W/CM & OR WO/CM  Result Date: 11/16/2019 CLINICAL DATA:  79 year old male with history of severe aortic stenosis. Preprocedural study prior to potential transcatheter aortic valve replacement (TAVR) procedure. EXAM: CT ANGIOGRAPHY CHEST, ABDOMEN AND PELVIS TECHNIQUE: Non-contrast CT of the chest was initially obtained. Multidetector CT imaging through the chest, abdomen and pelvis was performed using the standard protocol during bolus administration of intravenous contrast. Multiplanar reconstructed images and MIPs were obtained and reviewed to evaluate the vascular anatomy. CONTRAST:  183mL OMNIPAQUE IOHEXOL 350 MG/ML SOLN COMPARISON:  Chest CT 12/18/2017. CT the abdomen and pelvis 03/11/2008. FINDINGS: CTA CHEST FINDINGS Cardiovascular: Heart size is normal. There is no significant  pericardial fluid, thickening or pericardial calcification. There is aortic atherosclerosis, as well as atherosclerosis of the great vessels of the mediastinum and the coronary arteries, including calcified atherosclerotic plaque in the left main, left anterior descending, left circumflex and right coronary arteries. Severe thickening calcification of the aortic valve. Mediastinum/Lymph Nodes: No pathologically enlarged mediastinal or hilar lymph nodes. Esophagus is unremarkable in appearance. No axillary lymphadenopathy. Lungs/Pleura: Calcified pleural plaques in the thorax bilaterally, indicative of asbestos related pleural disease. No acute consolidative airspace disease. No pleural effusions. No suspicious appearing pulmonary nodules or masses are noted. Musculoskeletal/Soft Tissues: There are no aggressive appearing lytic or blastic lesions noted in the visualized portions of the skeleton. CTA ABDOMEN AND PELVIS FINDINGS Hepatobiliary: No suspicious cystic or  solid hepatic lesions. No intra or extrahepatic biliary ductal dilatation. Status post cholecystectomy. Pancreas: No pancreatic mass. No pancreatic ductal dilatation. No pancreatic or peripancreatic fluid collections or inflammatory changes. Spleen: Unremarkable. Adrenals/Urinary Tract: Low-attenuation lesions in the kidneys bilaterally, largest of which are compatible with simple cysts, measuring up to 3.0 x 2.2 cm in the interpolar region of the left kidney. Other subcentimeter low-attenuation lesions in both kidneys, too small to definitively characterize, but statistically likely to represent tiny cysts. No hydroureteronephrosis. Urinary bladder is unremarkable in appearance. Bilateral adrenal glands are normal in appearance. Stomach/Bowel: Normal appearance of the stomach. Small periampullary duodenal diverticulum, without surrounding inflammatory changes to suggest an associated diverticulitis at this time. No pathologic dilatation of small bowel or  colon. Normal appendix. Vascular/Lymphatic: Aortic atherosclerosis, without evidence of aneurysm or dissection in the abdominal or pelvic vasculature. No lymphadenopathy noted in the abdomen or pelvis. Reproductive: Prostate gland and seminal vesicles are unremarkable in appearance. Other: No significant volume of ascites.  No pneumoperitoneum. Musculoskeletal: Status post bilateral hip arthroplasty. There are no aggressive appearing lytic or blastic lesions noted in the visualized portions of the skeleton. VASCULAR MEASUREMENTS PERTINENT TO TAVR: AORTA: Minimal Aortic Diameter-14 x 12 mm Severity of Aortic Calcification-severe RIGHT PELVIS: Right Common Iliac Artery - Minimal Diameter-10.5 x 8.5 mm Tortuosity-mild-to-moderate Calcification-moderate Right External Iliac Artery - Minimal Diameter-7.6 x 8.0 mm Tortuosity-moderate to severe Calcification-none Right Common Femoral Artery - Minimal Diameter-7.0 x 8.4 mm Tortuosity-mild Calcification-none LEFT PELVIS: Left Common Iliac Artery - Minimal Diameter-9.2 x 8.4 mm Tortuosity-mild-to-moderate Calcification-moderate Left External Iliac Artery - Minimal Diameter-8.8 x 7.6 mm Tortuosity-mild Calcification-none Left Common Femoral Artery - Minimal Diameter-8.5 x 7.9 mm Tortuosity-mild Calcification-none Review of the MIP images confirms the above findings. IMPRESSION: 1. Vascular findings and measurements pertinent to potential TAVR procedure, as detailed above. 2. Severe thickening calcification of the aortic valve, compatible with reported clinical history of severe aortic stenosis. 3. Aortic atherosclerosis, in addition to left main and 3 vessel coronary artery disease. Assessment for potential risk factor modification, dietary therapy or pharmacologic therapy may be warranted, if clinically indicated. 4. Calcified pleural plaques in the thorax bilaterally compatible with asbestos related pleural disease. 5. Additional incidental findings, as above. Electronically  Signed   By: Vinnie Langton M.D.   On: 11/16/2019 11:09   ECHOCARDIOGRAM COMPLETE  Result Date: 10/31/2019    ECHOCARDIOGRAM REPORT   Patient Name:   [name] Date of Exam: 10/31/2019 Medical Rec #:  [id number]          Height:       66.0 in Accession #:    [id number]         Weight:       238.0 lb Date of Birth:  [birth year]           BSA:          2.153 m Patient Age:    79 years           BP:           150/70 mmHg Patient Gender: M                  HR:           84 bpm. Exam Location:  [facility] Procedure: 2D Echo, Cardiac Doppler and Color Doppler Indications:     I35.9 AVD  History:         Patient has prior history of Echocardiogram examinations, most  recent 06/21/2018. Aortic Valve Disease, Signs/Symptoms:Murmur                  and Shortness of Breath; Risk Factors:Morbidly obese,                  Hypertension, Diabetes, Dyslipidemia and Former Smoker.  Sonographer:     Jeneen Montgomery RDMS, RVT, RDCS Referring Phys:  [place] Diagnosing Phys: Rozann Lesches MD IMPRESSIONS  1. Left ventricular ejection fraction, by estimation, is 60 to 65%. The left ventricle has normal function. The left ventricle has no regional wall motion abnormalities. There is mild left ventricular hypertrophy. Left ventricular diastolic parameters are indeterminate.  2. Right ventricular systolic function is normal. The right ventricular size is normal. Tricuspid regurgitation signal is inadequate for assessing PA pressure.  3. The mitral valve is grossly normal with mild annular calcification. Trivial mitral valve regurgitation.  4. The aortic valve is tricuspid. There is moderate calcification of the aortic valve. Aortic valve regurgitation is not visualized. Moderate to severe aortic valve stenosis. Aortic valve mean gradient measures 23.2 mmHg. Aortic valve Vmax measures 3.21  m/s. Dimentionless index 0.28.  5. Aortic dilatation noted. There is borderline dilatation of the ascending aorta,  measuring 40 mm.  6. Unable to estimate CVP. FINDINGS  Left Ventricle: Left ventricular ejection fraction, by estimation, is 60 to 65%. The left ventricle has normal function. The left ventricle has no regional wall motion abnormalities. The left ventricular internal cavity size was normal in size. There is  mild left ventricular hypertrophy. Left ventricular diastolic parameters are indeterminate. Right Ventricle: The right ventricular size is normal. No increase in right ventricular wall thickness. Right ventricular systolic function is normal. Tricuspid regurgitation signal is inadequate for assessing PA pressure. Left Atrium: Left atrial size was normal in size. Right Atrium: Right atrial size was normal in size. Pericardium: There is no evidence of pericardial effusion. Mitral Valve: The mitral valve is grossly normal. Mild mitral annular calcification. Trivial mitral valve regurgitation. Tricuspid Valve: The tricuspid valve is grossly normal. Tricuspid valve regurgitation is mild. Aortic Valve: The aortic valve is tricuspid. There is moderate calcification of the aortic valve. There is moderate aortic valve annular calcification. Aortic valve regurgitation is not visualized. Moderate to severe aortic stenosis is present. Aortic valve mean gradient measures 23.2 mmHg. Aortic valve peak gradient measures 41.2 mmHg. Aortic valve area, by VTI measures 0.98 cm. Pulmonic Valve: The pulmonic valve was grossly normal. Pulmonic valve regurgitation is trivial. Aorta: Aortic dilatation noted. There is borderline dilatation of the ascending aorta, measuring 40 mm. Venous: Unable to estimate CVP. IVC assessment for right atrial pressure unable to be performed due to mechanical ventilation. IAS/Shunts: The interatrial septum was not well visualized.  LEFT VENTRICLE PLAX 2D LVIDd:         5.37 cm      Diastology LVIDs:         3.11 cm      LV e' medial:    6.87 cm/s LV PW:         1.20 cm      LV E/e' medial:  10.9 LV IVS:         1.16 cm      LV e' lateral:   5.71 cm/s LVOT diam:     2.10 cm      LV E/e' lateral: 13.2 LV SV:         56 LV SV Index:   26 LVOT Area:  3.46 cm  LV Volumes (MOD) LV vol d, MOD A2C: 93.9 ml LV vol d, MOD A4C: 120.0 ml LV vol s, MOD A2C: 37.0 ml LV vol s, MOD A4C: 50.0 ml LV SV MOD A2C:     56.9 ml LV SV MOD A4C:     120.0 ml LV SV MOD BP:      64.7 ml RIGHT VENTRICLE RV S prime:     17.40 cm/s TAPSE (M-mode): 2.3 cm LEFT ATRIUM             Index LA diam:        3.00 cm 1.39 cm/m LA Vol (A2C):   41.9 ml 19.46 ml/m LA Vol (A4C):   60.9 ml 28.28 ml/m LA Biplane Vol:         23.90 ml/m  AORTIC VALVE AV Area (Vmax):    1.01 cm AV Area (Vmean):   1.03 cm AV Area (VTI):     0.98 cm AV Vmax:           320.80 cm/s AV Vmean:          221.600 cm/s AV VTI:            0.578 m AV Peak Grad:      41.2 mmHg AV Mean Grad:      23.2 mmHg LVOT Vmax:         93.80 cm/s LVOT Vmean:        66.200 cm/s LVOT VTI:          0.163 m LVOT/AV VTI ratio: 0.28  AORTA Ao Root diam: 3.70 cm Ao Asc diam:  4.00 cm MITRAL VALVE               TRICUSPID VALVE MV Area (PHT):             TR Peak grad:   7.0 mmHg MV Decel Time: 209 msec    TR Vmax:        132.00 cm/s MR Peak grad: 10.1 mmHg MR Vmax:      159.00 cm/s  SHUNTS MV E velocity: 75.20 cm/s  Systemic VTI:  0.16 m MV A velocity: 85.90 cm/s  Systemic Diam: 2.10 cm MV E/A ratio:  0.88 Rozann Lesches MD Electronically signed by Rozann Lesches MD Signature Date/Time: 10/31/2019/11:33:28 AM    Final    CT Angio Abd/Pel w/ and/or w/o  Result Date: 11/16/2019 CLINICAL DATA:  79 year old male with history of severe aortic stenosis. Preprocedural study prior to potential transcatheter aortic valve replacement (TAVR) procedure. EXAM: CT ANGIOGRAPHY CHEST, ABDOMEN AND PELVIS TECHNIQUE: Non-contrast CT of the chest was initially obtained. Multidetector CT imaging through the chest, abdomen and pelvis was performed using the standard protocol during bolus administration of intravenous  contrast. Multiplanar reconstructed images and MIPs were obtained and reviewed to evaluate the vascular anatomy. CONTRAST:  149mL OMNIPAQUE IOHEXOL 350 MG/ML SOLN COMPARISON:  Chest CT 12/18/2017. CT the abdomen and pelvis 03/11/2008. FINDINGS: CTA CHEST FINDINGS Cardiovascular: Heart size is normal. There is no significant pericardial fluid, thickening or pericardial calcification. There is aortic atherosclerosis, as well as atherosclerosis of the great vessels of the mediastinum and the coronary arteries, including calcified atherosclerotic plaque in the left main, left anterior descending, left circumflex and right coronary arteries. Severe thickening calcification of the aortic valve. Mediastinum/Lymph Nodes: No pathologically enlarged mediastinal or hilar lymph nodes. Esophagus is unremarkable in appearance. No axillary lymphadenopathy. Lungs/Pleura: Calcified pleural plaques in the thorax bilaterally, indicative of asbestos related pleural disease. No  acute consolidative airspace disease. No pleural effusions. No suspicious appearing pulmonary nodules or masses are noted. Musculoskeletal/Soft Tissues: There are no aggressive appearing lytic or blastic lesions noted in the visualized portions of the skeleton. CTA ABDOMEN AND PELVIS FINDINGS Hepatobiliary: No suspicious cystic or solid hepatic lesions. No intra or extrahepatic biliary ductal dilatation. Status post cholecystectomy. Pancreas: No pancreatic mass. No pancreatic ductal dilatation. No pancreatic or peripancreatic fluid collections or inflammatory changes. Spleen: Unremarkable. Adrenals/Urinary Tract: Low-attenuation lesions in the kidneys bilaterally, largest of which are compatible with simple cysts, measuring up to 3.0 x 2.2 cm in the interpolar region of the left kidney. Other subcentimeter low-attenuation lesions in both kidneys, too small to definitively characterize, but statistically likely to represent tiny cysts. No hydroureteronephrosis.  Urinary bladder is unremarkable in appearance. Bilateral adrenal glands are normal in appearance. Stomach/Bowel: Normal appearance of the stomach. Small periampullary duodenal diverticulum, without surrounding inflammatory changes to suggest an associated diverticulitis at this time. No pathologic dilatation of small bowel or colon. Normal appendix. Vascular/Lymphatic: Aortic atherosclerosis, without evidence of aneurysm or dissection in the abdominal or pelvic vasculature. No lymphadenopathy noted in the abdomen or pelvis. Reproductive: Prostate gland and seminal vesicles are unremarkable in appearance. Other: No significant volume of ascites.  No pneumoperitoneum. Musculoskeletal: Status post bilateral hip arthroplasty. There are no aggressive appearing lytic or blastic lesions noted in the visualized portions of the skeleton. VASCULAR MEASUREMENTS PERTINENT TO TAVR: AORTA: Minimal Aortic Diameter-14 x 12 mm Severity of Aortic Calcification-severe RIGHT PELVIS: Right Common Iliac Artery - Minimal Diameter-10.5 x 8.5 mm Tortuosity-mild-to-moderate Calcification-moderate Right External Iliac Artery - Minimal Diameter-7.6 x 8.0 mm Tortuosity-moderate to severe Calcification-none Right Common Femoral Artery - Minimal Diameter-7.0 x 8.4 mm Tortuosity-mild Calcification-none LEFT PELVIS: Left Common Iliac Artery - Minimal Diameter-9.2 x 8.4 mm Tortuosity-mild-to-moderate Calcification-moderate Left External Iliac Artery - Minimal Diameter-8.8 x 7.6 mm Tortuosity-mild Calcification-none Left Common Femoral Artery - Minimal Diameter-8.5 x 7.9 mm Tortuosity-mild Calcification-none Review of the MIP images confirms the above findings. IMPRESSION: 1. Vascular findings and measurements pertinent to potential TAVR procedure, as detailed above. 2. Severe thickening calcification of the aortic valve, compatible with reported clinical history of severe aortic stenosis. 3. Aortic atherosclerosis, in addition to left main and 3  vessel coronary artery disease. Assessment for potential risk factor modification, dietary therapy or pharmacologic therapy may be warranted, if clinically indicated. 4. Calcified pleural plaques in the thorax bilaterally compatible with asbestos related pleural disease. 5. Additional incidental findings, as above. Electronically Signed   By: Vinnie Langton M.D.   On: 11/16/2019 11:09   Disposition   Pt is being discharged home today in improved condition.  Follow-up Plans & Appointments     Follow-up Information    [facility] Follow up on 11/22/2019.   Why: Please arrive at 10:00 AM and check in and admitting for 12:00 noon procedure. NOTHING TO EAT/DRINK AFTER MIDNIGHT THE NIGHT BEFORE. OK TO TAKE AM MEDS WITH A SIP OF WATER, INCLUDING ASPIRIN AND PLAVIX. HOLD the metformin 2 days before the test. Contact information: [place] [id number] [phone]             Discharge Instructions    Diet - low sodium heart healthy   Complete by: As directed    Diet Carb Modified   Complete by: As directed    Increase activity slowly   Complete by: As directed       Discharge Medications   Allergies as of  11/16/2019      Reactions   Ace Inhibitors Hives, Swelling, Rash   Rash,hives,tongue swelling   Angiotensin Receptor Blockers    Unknown reaction    Ciprofloxacin Other (See Comments)   confusion   Oxycodone-acetaminophen Other (See Comments)   Unknown reaction   Robaxin [methocarbamol] Other (See Comments)   Confusion    Toradol [ketorolac Tromethamine] Other (See Comments)   confusion   Valium Other (See Comments)   Hallucinations; confusion   Doxycycline Hives, Rash      Medication List    STOP taking these medications   losartan 50 MG tablet Commonly known as: COZAAR     TAKE these medications   acetaminophen 650 MG CR tablet Commonly known as: TYLENOL Take 1,300 mg by mouth in the morning, at noon, and  at bedtime.   amLODipine 10 MG tablet Commonly known as: NORVASC Take 1 tablet (10 mg total) by mouth daily. What changed:   medication strength  how much to take   aspirin EC 81 MG tablet Take 81 mg by mouth at bedtime.   atorvastatin 40 MG tablet Commonly known as: LIPITOR Take 1 tablet (40 mg total) by mouth every evening. What changed:   medication strength  how much to take   butalbital-acetaminophen-caffeine 50-325-40 MG tablet Commonly known as: FIORICET Take 1 tablet by mouth 2 (two) times daily as needed for headache.   cetirizine 10 MG tablet Commonly known as: ZYRTEC Take 10 mg by mouth daily with supper.   clopidogrel 75 MG tablet Commonly known as: PLAVIX Take 1 tablet (75 mg total) by mouth daily. Start taking on: November 17, 2019   Dexcom G6 Receiver Kerrin Mo USE TO CHECK BLOOD SUGAR UP TO 4 TIMES DAILY AS DIRECTED. 1 DEVICE PER YEAR. DX: E11.9   Dexcom G6 Sensor Misc USE TO CHECK BLOOD SUGAR UP TO 4 TIMES DAILY AS DIRECTED. CHANGE SENSOR EVERY 30 DAYS. DX: E11.9   Dexcom G6 Transmitter Misc USE TO CHECK BLOOD SUGAR UP TO 4 TIMES DAILY AS DIRECTED. CHANGE TRANSMITTER EVERY 3 MONTHS. DX: E11.9   docusate sodium 100 MG capsule Commonly known as: COLACE Take 100 mg by mouth daily as needed for mild constipation or moderate constipation.   donepezil 5 MG tablet Commonly known as: ARICEPT Take 5 mg by mouth at bedtime.   famotidine 20 MG tablet Commonly known as: PEPCID TAKE 1 TABLET BY MOUTH  TWICE DAILY WITH MEALS What changed: when to take this   Fish Oil 1200 MG Caps Take 1,200-2,400 mg by mouth See admin instructions. Tale 1200 mg in the morning and 2400 mg in the evening   furosemide 20 MG tablet Commonly known as: LASIX Take 3 tablets (60 mg total) by mouth daily. Start taking on: November 17, 2019   glucose blood test strip Test 4 times daily Dx E11.59   HumaLOG KwikPen 100 UNIT/ML KwikPen Generic drug: insulin lispro INJECT 0 TO 15  UNITS  SUBCUTANEOUSLY 3 TIMES  DAILY BEFORE MEALS ;  70-150=8 UNITS, 151-200= 9  UNITS. What changed: See the new instructions.   isosorbide mononitrate 30 MG 24 hr tablet Commonly known as: IMDUR TAKE 1 TABLET BY MOUTH IN  THE EVENING What changed: when to take this   Lantus SoloStar 100 UNIT/ML Solostar Pen Generic drug: insulin glargine INJECT SUBCUTANEOUSLY 52  UNITS AT BEDTIME What changed: See the new instructions.   metFORMIN 500 MG 24 hr tablet Commonly known as: GLUCOPHAGE-XR TAKE 2 TABLETS BY MOUTH  TWICE DAILY  What changed: when to take this Notes to patient: HOLD FOR 2 DAYS, RESTART ON MONDAY, 11/19/2019.   nitroGLYCERIN 0.4 MG SL tablet Commonly known as: NITROSTAT DISSOLVE ONE TABLET UNDER THE TONGUE EVERY 5 MINUTES AS NEEDED FOR CHEST PAIN.  DO NOT EXCEED A TOTAL OF 3 DOSES IN 15 MINUTES What changed:   how much to take  how to take this  when to take this  reasons to take this   PARoxetine 20 MG tablet Commonly known as: PAXIL TAKE 1 TABLET BY MOUTH  DAILY What changed: when to take this   potassium chloride SA 20 MEQ tablet Commonly known as: KLOR-CON Take 1 tablet (20 mEq total) by mouth daily. Start taking on: November 17, 2019   tamsulosin 0.4 MG Caps capsule Commonly known as: FLOMAX TAKE 1 CAPSULE BY MOUTH  DAILY What changed: when to take this   Turmeric Curcumin 500 MG Caps Take 500 mg by mouth daily.   vitamin B-12 1000 MCG tablet Commonly known as: CYANOCOBALAMIN Take 2,000 mcg by mouth daily.   Vitamin D3 50 MCG (2000 UT) capsule Take 4,000 Units by mouth daily.          Outstanding Labs/Studies   None  Duration of Discharge Encounter   Greater than 30 minutes including physician time.  Signed, Rosaria Ferries, PA-C 11/16/2019, 7:37 PM

## 2019-11-16 NOTE — Progress Notes (Signed)
Pt discharged home per MD order, discharge instructions explained to pt and pt's wife, expressed understanding. Wheeled to discharge area. Home in stable condition.

## 2019-11-16 NOTE — Progress Notes (Signed)
Interventional Radiology Brief Note:  PA followed up with patient after contrast extravasation in CT yesterday. Patient thrilled he was able to successfully get imaging completed yesterday and is anxiously awaiting news from medical team of the next steps and possible discharge.  RUA assessed.  Area of infiltration is resolved, soft.   ROM intact.  Sensation intact.  No numbness. Hand grip strength intact. No skin breakdown.  No further follow-up needed at this time.   Brynda Greathouse, MS RD PA-C

## 2019-11-16 NOTE — H&P (View-Only) (Signed)
[place] VALVE TEAM  Patient Name: [name] Date of Encounter: 11/16/2019  Primary Cardiologist: Dr. Jesse Sans Problem List     Active Problems:   Unstable angina [facility])   Nonrheumatic aortic valve stenosis   Subjective   Breathing much better with diuresis. Could walk down the hallway with more ease.   Inpatient Medications    Scheduled Meds: . acetaminophen  1,000 mg Oral Q8H  . amLODipine  5 mg Oral Daily  . aspirin EC  81 mg Oral Daily  . atorvastatin  40 mg Oral q1800  . docusate sodium  100 mg Oral BID WC  . donepezil  5 mg Oral QHS  . famotidine  20 mg Oral BID WC  . furosemide  40 mg Intravenous Q12H  . insulin aspart  15-20 Units Subcutaneous TID WC  . insulin glargine  62 Units Subcutaneous QHS  . isosorbide mononitrate  30 mg Oral Daily  . loratadine  10 mg Oral Q supper  . PARoxetine  20 mg Oral Daily  . sodium chloride flush  3 mL Intravenous Q12H  . tamsulosin  0.4 mg Oral Q supper   Continuous Infusions: . sodium chloride     PRN Meds: sodium chloride, artificial tears, hydrocortisone, nitroGLYCERIN, ondansetron (ZOFRAN) IV, sodium chloride flush   Vital Signs    Vitals:   11/16/19 0420 11/16/19 0940 11/16/19 1015 11/16/19 1100  BP:  (!) 151/74 (!) 173/75 (!) 172/77  Pulse:  67 62 67  Resp:  20 20 17   Temp: 97.7 F (36.5 C)  (!) 97.5 F (36.4 C) 97.7 F (36.5 C)  TempSrc: Oral  Oral Oral  SpO2:  95% 96% 96%  Weight: 104.7 kg     Height:        Intake/Output Summary (Last 24 hours) at 11/16/2019 1249 Last data filed at 11/16/2019 0837 Gross per 24 hour  Intake 620 ml  Output 1725 ml  Net -1105 ml   Filed Weights   11/14/19 0812 11/15/19 0339 11/16/19 0420  Weight: 104.3 kg 105.2 kg 104.7 kg    Physical Exam   GEN: Well nourished, well developed, in no acute distress. obese HEENT: Grossly normal.  Neck: Supple, no JVD, carotid bruits, or masses. Cardiac: RRR, loud,  harsh SEM.  No rubs, or gallops. No clubbing, cyanosis, edema.   Respiratory:  Respirations regular and unlabored, clear to auscultation bilaterally. GI: Soft, nontender, nondistended, BS + x 4. Obese abdomen  MS: no deformity or atrophy. Skin: warm and dry, no rash. Neuro:  Strength and sensation are intact. Psych: AAOx3.  Normal affect.  Labs    CBC Recent Labs    11/14/19 1108 11/16/19 0206  WBC  --  6.9  HGB 12.6* 13.7  HCT 37.0* 42.2  MCV  --  85.6  PLT  --  998   Basic Metabolic Panel Recent Labs    11/15/19 1122 11/16/19 0206  NA 135 136  K 3.7 3.6  CL 100 99  CO2 24 25  GLUCOSE 256* 269*  BUN 16 16  CREATININE 1.18 1.17  CALCIUM 8.8* 8.9   Liver Function Tests No results for input(s): AST, ALT, ALKPHOS, BILITOT, PROT, ALBUMIN in the last 72 hours. No results for input(s): LIPASE, AMYLASE in the last 72 hours. Cardiac Enzymes No results for input(s): CKTOTAL, CKMB, CKMBINDEX, TROPONINI in the last 72 hours. BNP Invalid input(s): POCBNP D-Dimer No results for input(s): DDIMER in the last 72 hours.  Hemoglobin A1C Recent Labs    11/14/19 1557  HGBA1C 7.6*   Fasting Lipid Panel Recent Labs    11/16/19 0206  CHOL 123  HDL 32*  LDLCALC 66  TRIG 125  CHOLHDL 3.8   Thyroid Function Tests No results for input(s): TSH, T4TOTAL, T3FREE, THYROIDAB in the last 72 hours.  Invalid input(s): FREET3  Telemetry    Sinus  - Personally Reviewed  ECG    Sinus brady - Personally Reviewed  Radiology    CT CORONARY MORPH W/CTA COR W/SCORE W/CA W/CM &/OR WO/CM  Addendum Date: 11/16/2019   ADDENDUM REPORT: 11/16/2019 11:10 CLINICAL DATA:  Severe Aortic Stenosis. EXAM: Cardiac TAVR CT TECHNIQUE: The patient was scanned on a [employer]. A 120 kV retrospective scan was triggered in the descending thoracic aorta at 111 HU's. Gantry rotation speed was 250 msecs and collimation was .6 mm. No beta blockade or nitro were given. The 3D data set was  reconstructed in 5% intervals of the R-R cycle. Systolic and diastolic phases were analyzed on a dedicated work station using MPR, MIP and VRT modes. The patient received 80 cc of contrast. FINDINGS: Image quality: Excellent. Noise artifact is: Limited. Valve Morphology: The aortic valve is tricuspid with severe thickening and severe calcifications. There NCC is contains diffuse bulky leaflet calcifications. There is limited movement in systole of the leaflets. Aortic Valve Calcium score: 2012 Aortic annular dimension: Phase assessed: 25% Annular area: 516 mm2 Annular perimeter: 81.7 mm Max diameter: 28.2 mm Min diameter: 24.2 mm Annular and subannular calcification: None. Optimal coplanar projection: RAO 3 CAU 6 Coronary Artery Height above Annulus: Left Main: 13.0 mm Right Coronary: 16.5 mm Sinus of Valsalva Measurements: Non-coronary: 39 mm Right-coronary: 38 mm Left-coronary: 37 mm Sinus of Valsalva Height: Non-coronary: 28.6 mm Right-coronary: 22.4 mm Left-coronary: 20.9 mm Sinotubular Junction: 37 mm. Ascending Thoracic Aorta: 40 mm. Coronary Arteries: Normal coronary origin. Left dominance. The study was performed without use of NTG and is insufficient for plaque evaluation. Please refer to recent cardiac catheterization for coronary assessment. Severe 3-vessel calcifications noted. Cardiac Morphology: Right Atrium: Right atrial size is within normal limits. Right Ventricle: The right ventricular cavity is within normal limits. Left Atrium: Left atrial size is normal in size with no left atrial appendage filling defect. Left Ventricle: The ventricular cavity size is within normal limits. There are no stigmata of prior infarction. There is no abnormal filling defect. Normal left ventricular function, LVEF=67%. No regional wall motion abnormalities. Pulmonary arteries: Dilated pulmonary artery suggestive of pulmonary hypertension. Pulmonary veins: Normal pulmonary venous drainage. Pericardium: Normal thickness  with no significant effusion or calcium present. Mitral Valve: The mitral valve is normal structure without significant calcification. Extra-cardiac findings: See attached radiology report for non-cardiac structures. IMPRESSION: 1. Tricuspid aortic valve with severe aortic stenosis (calcium score = 2012). 2. Bulky calcifications noted in the [place]. 3. Annular measurements appropriate for 26 mm Edwards Sapien 3 TAVR. 4. No significant annular or subannular calcifications. 5. Sufficient coronary to annulus distance. 6. Optimal Fluoroscopic Angle for Delivery: RAO 3 CAU 6 7. Dilated pulmonary artery suggestive of pulmonary hypertension. 8. Severe 3-vessel coronary calcifications. [place] T. Audie Box, MD Electronically Signed   By: Eleonore Chiquito   On: 11/16/2019 11:10   Result Date: 11/16/2019 EXAM: OVER-READ INTERPRETATION  CT CHEST The following report is an over-read performed by radiologist Dr. Vinnie Langton of [facility], [place] on 11/16/2019. This over-read does not include interpretation of cardiac or coronary anatomy or pathology. The coronary  calcium score/coronary CTA interpretation by the cardiologist is attached. COMPARISON:  Chest CT 12/18/2017. FINDINGS: Extracardiac findings will be described separately under dictation for contemporaneously obtained CTA chest, abdomen and pelvis. IMPRESSION: Please see separate dictation for contemporaneously obtained CTA chest, abdomen and pelvis 11/15/2019 for full description of relevant extracardiac findings. Electronically Signed: By: Vinnie Langton M.D. On: 11/16/2019 07:29   CT ANGIO CHEST AORTA W/CM & OR WO/CM  Result Date: 11/16/2019 CLINICAL DATA:  79 year old male with history of severe aortic stenosis. Preprocedural study prior to potential transcatheter aortic valve replacement (TAVR) procedure. EXAM: CT ANGIOGRAPHY CHEST, ABDOMEN AND PELVIS TECHNIQUE: Non-contrast CT of the chest was initially obtained. Multidetector CT imaging through the chest,  abdomen and pelvis was performed using the standard protocol during bolus administration of intravenous contrast. Multiplanar reconstructed images and MIPs were obtained and reviewed to evaluate the vascular anatomy. CONTRAST:  114mL OMNIPAQUE IOHEXOL 350 MG/ML SOLN COMPARISON:  Chest CT 12/18/2017. CT the abdomen and pelvis 03/11/2008. FINDINGS: CTA CHEST FINDINGS Cardiovascular: Heart size is normal. There is no significant pericardial fluid, thickening or pericardial calcification. There is aortic atherosclerosis, as well as atherosclerosis of the great vessels of the mediastinum and the coronary arteries, including calcified atherosclerotic plaque in the left main, left anterior descending, left circumflex and right coronary arteries. Severe thickening calcification of the aortic valve. Mediastinum/Lymph Nodes: No pathologically enlarged mediastinal or hilar lymph nodes. Esophagus is unremarkable in appearance. No axillary lymphadenopathy. Lungs/Pleura: Calcified pleural plaques in the thorax bilaterally, indicative of asbestos related pleural disease. No acute consolidative airspace disease. No pleural effusions. No suspicious appearing pulmonary nodules or masses are noted. Musculoskeletal/Soft Tissues: There are no aggressive appearing lytic or blastic lesions noted in the visualized portions of the skeleton. CTA ABDOMEN AND PELVIS FINDINGS Hepatobiliary: No suspicious cystic or solid hepatic lesions. No intra or extrahepatic biliary ductal dilatation. Status post cholecystectomy. Pancreas: No pancreatic mass. No pancreatic ductal dilatation. No pancreatic or peripancreatic fluid collections or inflammatory changes. Spleen: Unremarkable. Adrenals/Urinary Tract: Low-attenuation lesions in the kidneys bilaterally, largest of which are compatible with simple cysts, measuring up to 3.0 x 2.2 cm in the interpolar region of the left kidney. Other subcentimeter low-attenuation lesions in both kidneys, too small to  definitively characterize, but statistically likely to represent tiny cysts. No hydroureteronephrosis. Urinary bladder is unremarkable in appearance. Bilateral adrenal glands are normal in appearance. Stomach/Bowel: Normal appearance of the stomach. Small periampullary duodenal diverticulum, without surrounding inflammatory changes to suggest an associated diverticulitis at this time. No pathologic dilatation of small bowel or colon. Normal appendix. Vascular/Lymphatic: Aortic atherosclerosis, without evidence of aneurysm or dissection in the abdominal or pelvic vasculature. No lymphadenopathy noted in the abdomen or pelvis. Reproductive: Prostate gland and seminal vesicles are unremarkable in appearance. Other: No significant volume of ascites.  No pneumoperitoneum. Musculoskeletal: Status post bilateral hip arthroplasty. There are no aggressive appearing lytic or blastic lesions noted in the visualized portions of the skeleton. VASCULAR MEASUREMENTS PERTINENT TO TAVR: AORTA: Minimal Aortic Diameter-14 x 12 mm Severity of Aortic Calcification-severe RIGHT PELVIS: Right Common Iliac Artery - Minimal Diameter-10.5 x 8.5 mm Tortuosity-mild-to-moderate Calcification-moderate Right External Iliac Artery - Minimal Diameter-7.6 x 8.0 mm Tortuosity-moderate to severe Calcification-none Right Common Femoral Artery - Minimal Diameter-7.0 x 8.4 mm Tortuosity-mild Calcification-none LEFT PELVIS: Left Common Iliac Artery - Minimal Diameter-9.2 x 8.4 mm Tortuosity-mild-to-moderate Calcification-moderate Left External Iliac Artery - Minimal Diameter-8.8 x 7.6 mm Tortuosity-mild Calcification-none Left Common Femoral Artery - Minimal Diameter-8.5 x 7.9 mm Tortuosity-mild Calcification-none Review of  the MIP images confirms the above findings. IMPRESSION: 1. Vascular findings and measurements pertinent to potential TAVR procedure, as detailed above. 2. Severe thickening calcification of the aortic valve, compatible with reported  clinical history of severe aortic stenosis. 3. Aortic atherosclerosis, in addition to left main and 3 vessel coronary artery disease. Assessment for potential risk factor modification, dietary therapy or pharmacologic therapy may be warranted, if clinically indicated. 4. Calcified pleural plaques in the thorax bilaterally compatible with asbestos related pleural disease. 5. Additional incidental findings, as above. Electronically Signed   By: Vinnie Langton M.D.   On: 11/16/2019 11:09   CT Angio Abd/Pel w/ and/or w/o  Result Date: 11/16/2019 CLINICAL DATA:  79 year old male with history of severe aortic stenosis. Preprocedural study prior to potential transcatheter aortic valve replacement (TAVR) procedure. EXAM: CT ANGIOGRAPHY CHEST, ABDOMEN AND PELVIS TECHNIQUE: Non-contrast CT of the chest was initially obtained. Multidetector CT imaging through the chest, abdomen and pelvis was performed using the standard protocol during bolus administration of intravenous contrast. Multiplanar reconstructed images and MIPs were obtained and reviewed to evaluate the vascular anatomy. CONTRAST:  169mL OMNIPAQUE IOHEXOL 350 MG/ML SOLN COMPARISON:  Chest CT 12/18/2017. CT the abdomen and pelvis 03/11/2008. FINDINGS: CTA CHEST FINDINGS Cardiovascular: Heart size is normal. There is no significant pericardial fluid, thickening or pericardial calcification. There is aortic atherosclerosis, as well as atherosclerosis of the great vessels of the mediastinum and the coronary arteries, including calcified atherosclerotic plaque in the left main, left anterior descending, left circumflex and right coronary arteries. Severe thickening calcification of the aortic valve. Mediastinum/Lymph Nodes: No pathologically enlarged mediastinal or hilar lymph nodes. Esophagus is unremarkable in appearance. No axillary lymphadenopathy. Lungs/Pleura: Calcified pleural plaques in the thorax bilaterally, indicative of asbestos related pleural  disease. No acute consolidative airspace disease. No pleural effusions. No suspicious appearing pulmonary nodules or masses are noted. Musculoskeletal/Soft Tissues: There are no aggressive appearing lytic or blastic lesions noted in the visualized portions of the skeleton. CTA ABDOMEN AND PELVIS FINDINGS Hepatobiliary: No suspicious cystic or solid hepatic lesions. No intra or extrahepatic biliary ductal dilatation. Status post cholecystectomy. Pancreas: No pancreatic mass. No pancreatic ductal dilatation. No pancreatic or peripancreatic fluid collections or inflammatory changes. Spleen: Unremarkable. Adrenals/Urinary Tract: Low-attenuation lesions in the kidneys bilaterally, largest of which are compatible with simple cysts, measuring up to 3.0 x 2.2 cm in the interpolar region of the left kidney. Other subcentimeter low-attenuation lesions in both kidneys, too small to definitively characterize, but statistically likely to represent tiny cysts. No hydroureteronephrosis. Urinary bladder is unremarkable in appearance. Bilateral adrenal glands are normal in appearance. Stomach/Bowel: Normal appearance of the stomach. Small periampullary duodenal diverticulum, without surrounding inflammatory changes to suggest an associated diverticulitis at this time. No pathologic dilatation of small bowel or colon. Normal appendix. Vascular/Lymphatic: Aortic atherosclerosis, without evidence of aneurysm or dissection in the abdominal or pelvic vasculature. No lymphadenopathy noted in the abdomen or pelvis. Reproductive: Prostate gland and seminal vesicles are unremarkable in appearance. Other: No significant volume of ascites.  No pneumoperitoneum. Musculoskeletal: Status post bilateral hip arthroplasty. There are no aggressive appearing lytic or blastic lesions noted in the visualized portions of the skeleton. VASCULAR MEASUREMENTS PERTINENT TO TAVR: AORTA: Minimal Aortic Diameter-14 x 12 mm Severity of Aortic  Calcification-severe RIGHT PELVIS: Right Common Iliac Artery - Minimal Diameter-10.5 x 8.5 mm Tortuosity-mild-to-moderate Calcification-moderate Right External Iliac Artery - Minimal Diameter-7.6 x 8.0 mm Tortuosity-moderate to severe Calcification-none Right Common Femoral Artery - Minimal Diameter-7.0 x 8.4 mm Tortuosity-mild Calcification-none  LEFT PELVIS: Left Common Iliac Artery - Minimal Diameter-9.2 x 8.4 mm Tortuosity-mild-to-moderate Calcification-moderate Left External Iliac Artery - Minimal Diameter-8.8 x 7.6 mm Tortuosity-mild Calcification-none Left Common Femoral Artery - Minimal Diameter-8.5 x 7.9 mm Tortuosity-mild Calcification-none Review of the MIP images confirms the above findings. IMPRESSION: 1. Vascular findings and measurements pertinent to potential TAVR procedure, as detailed above. 2. Severe thickening calcification of the aortic valve, compatible with reported clinical history of severe aortic stenosis. 3. Aortic atherosclerosis, in addition to left main and 3 vessel coronary artery disease. Assessment for potential risk factor modification, dietary therapy or pharmacologic therapy may be warranted, if clinically indicated. 4. Calcified pleural plaques in the thorax bilaterally compatible with asbestos related pleural disease. 5. Additional incidental findings, as above. Electronically Signed   By: Vinnie Langton M.D.   On: 11/16/2019 11:09    Cardiac Studies   11/14/19 RIGHT/LEFT HEART CATH AND CORONARY ANGIOGRAPHY  Conclusion    Prox RCA-1 lesion is 70% stenosed.  Prox RCA-2 lesion is 95% stenosed.  LPAV lesion is 90% stenosed.  Lat 3rd Mrg lesion is 80% stenosed.  Prox Cx to Mid Cx lesion is 30% stenosed.  Prox LAD to Mid LAD lesion is 99% stenosed.  Mid LAD lesion is 99% stenosed.  2nd Diag lesion is 90% stenosed.   1. Severe proximal LAD stenosis, calcified. Severe mid LAD stenosis at the takeoff of the moderate caliber diagonal branch, calcified.  2.  Severe distal AV groove stenosis in the left dominant Circumflex. Severe OM 2 stenosis.  3. Moderate caliber non-dominant RCA with patent proximal stent with moderately severe restenosis. Severe mid RCA stenosis.  4. Moderately severe to severe aortic stenosis (mean gradient 26.6 mmHg, peak to peak gradient 31 mmHg, AVA 1.06 cm2).   Recommendations: He has moderately severe to severe AS and three vessel CAD. I will admit him to telemetry. Given elevated pressures, will begin IV Lasix. I will ask our CT surgery team to see him to discuss CABG/surgical AVR vs staged PCI and TAVR. Will obtain pre-TAVR CT scans to fully assess his aorta and access.   In regards to bypass, the RCA is non-dominant. I think he would benefit from bypass of the LAD and potentially the OM branch and left sided PDA. If he is not felt to be a candidate for CABG, we would plan staged PCI of the LAD which would require atherectomy prior to stent placement. The LAD is calcified. The proximal LAD stenosis could be easily treated with orbital atherectomy and stenting. The mid LAD lesion is at the takeoff of a moderate caliber diagonal branch which also has a severe ostial stenosis. This would be higher risk in regards to potentially losing flow down that diagonal branch post stenting of the LAD.   He has mild memory issues but is overall functional at baseline, recently limited by dyspnea with exertion. He may be a candidate for open surgery with AVR and bypass.   __________________  Echo 10/31/19 IMPRESSIONS  1. Left ventricular ejection fraction, by estimation, is 60 to 65%. The  left ventricle has normal function. The left ventricle has no regional  wall motion abnormalities. There is mild left ventricular hypertrophy.  Left ventricular diastolic parameters  are indeterminate.  2. Right ventricular systolic function is normal. The right ventricular  size is normal. Tricuspid regurgitation signal is inadequate for  assessing  PA pressure.  3. The mitral valve is grossly normal with mild annular calcification.  Trivial mitral valve regurgitation.  4. The  aortic valve is tricuspid. There is moderate calcification of the  aortic valve. Aortic valve regurgitation is not visualized. Moderate to  severe aortic valve stenosis. Aortic valve mean gradient measures 23.2  mmHg. Aortic valve Vmax measures 3.21  m/s. Dimentionless index 0.28.  5. Aortic dilatation noted. There is borderline dilatation of the  ascending aorta, measuring 40 mm.  6. Unable to estimate CVP.   FINDINGS  Left Ventricle: Left ventricular ejection fraction, by estimation, is 60  to 65%. The left ventricle has normal function. The left ventricle has no  regional wall motion abnormalities. The left ventricular internal cavity  size was normal in size. There is  mild left ventricular hypertrophy. Left ventricular diastolic parameters  are indeterminate.   Right Ventricle: The right ventricular size is normal. No increase in  right ventricular wall thickness. Right ventricular systolic function is  normal. Tricuspid regurgitation signal is inadequate for assessing PA  pressure.   Left Atrium: Left atrial size was normal in size.   Right Atrium: Right atrial size was normal in size.   Pericardium: There is no evidence of pericardial effusion.   Mitral Valve: The mitral valve is grossly normal. Mild mitral annular  calcification. Trivial mitral valve regurgitation.   Tricuspid Valve: The tricuspid valve is grossly normal. Tricuspid valve  regurgitation is mild.   Aortic Valve: The aortic valve is tricuspid. There is moderate  calcification of the aortic valve. There is moderate aortic valve annular  calcification. Aortic valve regurgitation is not visualized. Moderate to  severe aortic stenosis is present. Aortic  valve mean gradient measures 23.2 mmHg. Aortic valve peak gradient  measures 41.2 mmHg. Aortic valve area,  by VTI measures 0.98 cm.   Pulmonic Valve: The pulmonic valve was grossly normal. Pulmonic valve  regurgitation is trivial.   Aorta: Aortic dilatation noted. There is borderline dilatation of the  ascending aorta, measuring 40 mm.   Venous: Unable to estimate CVP. IVC assessment for right atrial pressure  unable to be performed due to mechanical ventilation.   IAS/Shunts: The interatrial septum was not well visualized.     LEFT VENTRICLE  PLAX 2D  LVIDd:     5.37 cm   Diastology  LVIDs:     3.11 cm   LV e' medial:  6.87 cm/s  LV PW:     1.20 cm   LV E/e' medial: 10.9  LV IVS:    1.16 cm   LV e' lateral:  5.71 cm/s  LVOT diam:   2.10 cm   LV E/e' lateral: 13.2  LV SV:     56  LV SV Index:  26  LVOT Area:   3.46 cm    LV Volumes (MOD)  LV vol d, MOD A2C: 93.9 ml  LV vol d, MOD A4C: 120.0 ml  LV vol s, MOD A2C: 37.0 ml  LV vol s, MOD A4C: 50.0 ml  LV SV MOD A2C:   56.9 ml  LV SV MOD A4C:   120.0 ml  LV SV MOD BP:   64.7 ml   RIGHT VENTRICLE  RV S prime:   17.40 cm/s  TAPSE (M-mode): 2.3 cm   LEFT ATRIUM       Index  LA diam:    3.00 cm 1.39 cm/m  LA Vol (A2C):  41.9 ml 19.46 ml/m  LA Vol (A4C):  60.9 ml 28.28 ml/m  LA Biplane Vol:     23.90 ml/m  AORTIC VALVE  AV Area (Vmax):  1.01 cm  AV Area (Vmean):  1.03 cm  AV Area (VTI):   0.98 cm  AV Vmax:      320.80 cm/s  AV Vmean:     221.600 cm/s  AV VTI:      0.578 m  AV Peak Grad:   41.2 mmHg  AV Mean Grad:   23.2 mmHg  LVOT Vmax:     93.80 cm/s  LVOT Vmean:    66.200 cm/s  LVOT VTI:     0.163 m  LVOT/AV VTI ratio: 0.28    AORTA  Ao Root diam: 3.70 cm  Ao Asc diam: 4.00 cm   MITRAL VALVE        TRICUSPID VALVE  MV Area (PHT):       TR Peak grad:  7.0 mmHg  MV Decel Time: 209 msec  TR Vmax:    132.00 cm/s  MR Peak grad: 10.1 mmHg  MR Vmax:   159.00 cm/s SHUNTS  MV  E velocity: 75.20 cm/s Systemic VTI: 0.16 m  MV A velocity: 85.90 cm/s Systemic Diam: 2.10 cm  MV E/A ratio: 0.88   Rozann Lesches MD  Electronically signed by Rozann Lesches MD  Signature Date/Time: 10/31/2019/11:33:28 AM    ____________________  Cardiac CT 11/15/19 IMPRESSION: 1. Tricuspid aortic valve with severe aortic stenosis (calcium score = 2012).  2. Bulky calcifications noted in the [place].  3. Annular measurements appropriate for 26 mm Edwards Sapien 3 TAVR.  4. No significant annular or subannular calcifications.  5. Sufficient coronary to annulus distance.  6. Optimal Fluoroscopic Angle for Delivery: RAO 3 CAU 6  7. Dilated pulmonary artery suggestive of pulmonary hypertension.  8. Severe 3-vessel coronary calcifications.  [place] T. O'Neal, MD  __________________  CT angio/chest/abd/pelvis 11/15/19 VASCULAR MEASUREMENTS PERTINENT TO TAVR:  AORTA:  Minimal Aortic Diameter-14 x 12 mm  Severity of Aortic Calcification-severe  RIGHT PELVIS:  Right Common Iliac Artery -  Minimal Diameter-10.5 x 8.5 mm  Tortuosity-mild-to-moderate  Calcification-moderate  Right External Iliac Artery -  Minimal Diameter-7.6 x 8.0 mm  Tortuosity-moderate to severe  Calcification-none  Right Common Femoral Artery -  Minimal Diameter-7.0 x 8.4 mm  Tortuosity-mild  Calcification-none  LEFT PELVIS:  Left Common Iliac Artery -  Minimal Diameter-9.2 x 8.4 mm  Tortuosity-mild-to-moderate  Calcification-moderate  Left External Iliac Artery -  Minimal Diameter-8.8 x 7.6 mm  Tortuosity-mild  Calcification-none  Left Common Femoral Artery -  Minimal Diameter-8.5 x 7.9 mm  Tortuosity-mild  Calcification-none  Review of the MIP images confirms the above findings.  IMPRESSION: 1. Vascular findings and measurements pertinent to potential TAVR procedure, as detailed above. 2. Severe thickening  calcification of the aortic valve, compatible with reported clinical history of severe aortic stenosis. 3. Aortic atherosclerosis, in addition to left main and 3 vessel coronary artery disease. Assessment for potential risk factor modification, dietary therapy or pharmacologic therapy may be warranted, if clinically indicated. 4. Calcified pleural plaques in the thorax bilaterally compatible with asbestos related pleural disease. 5. Additional incidental findings, as above.  Patient Profile     [name] is a 79 y.o. male with a history of CAD s/p BMS to RCA (2006), HTN, GERD, HLD, sleep apnea, IDDM and moderate to severe aortic stenosis undergoing TAVR work up who presented to [facility] on 11/14/19 for planned diagnostic cath. Cath showed elevated filling pressures and multivessel CAD. He was admitted for IV diuresis and CT surgery consult to discuss CABG/surgical AVR vs staged PCI and TAVR.  Assessment & Plan  Acute on chronic diastolic CHF: started on IV lasix 40mg  BID. Net neg 2.6L, weights appear to be inaccurate. Creat has remained stable. Will give one more dose of IV lasix today and convert to po tomorrow.   Moderate to severe AS: echo 10/31/19 showed LVEF 60-65%, mild LVH, moderate-severe AS with a mean gradient 23.2 mmHg, peak gradient 41.2 mmHg, AVA 0.98 cm2, DVI 0.28. His echo parameters suggest moderate to severe AS.Given the patient's progressive exertional dyspnea, TAVR work up was initiated. Pre TAVR cath data: mean gradient 26.6 mmHg, peak to peak gradient 31 mmHg, AVA 1.06 cm2. Cardiac gated CTA of the heart reveals anatomical characteristics consistent with aortic stenosis suitable for treatment by transcatheter aortic valve replacement without any significant complicating features and CTA of the aorta and iliac vessels demonstrate what appear to be adequate pelvic vascular access to facilitate a transfemoral approach. Will wait for TCTS consult to decide on TAVR vs SAVR  based on multivessel CAD.   Multivessel CAD with unstable angina: patient is s/p left heart cath showing significant multivessel disease as outlined above. Dr. Cyndia Bent to see today to decide if he is a surgical candidate. If patient is not a candidate for surgery, than he will need staged PCI with TAVR.  DMT2: continue SSI   HTN: BP has been elevated. Will increase norvasc from 5mg  to 10mg  daily. Continue imdur 30mg  daily.   SignedAngelena Form, PA-C  11/16/2019, 12:49 PM  Pager [phone]   Patient seen, examined. Available data reviewed. Agree with findings, assessment, and plan as outlined by Nell Range, PA-C.  The patient is independently interviewed and examined.  His wife is at the bedside.  On my exam, he is alert, oriented, in no distress.  JVP is normal, lungs are clear bilaterally, heart is regular rate and rhythm with a harsh 3/6 crescendo decrescendo murmur at the right upper sternal border with an audible A2.  Abdomen is soft, obese, and nontender.  Extremities have no edema.  The patient CTA studies are completed and are reviewed this afternoon.  He does appear to have transfemoral access for TAVR and suitable aortic valve anatomy.  I have also reviewed his coronary angiogram films.  If he is felt to be a poor candidate for CABG/AVR, it would be reasonable to proceed with atherectomy and stenting of the LAD as outlined in Dr. Camillia Herter cardiac catheterization note.  Dr. Cyndia Bent will evaluate the patient this afternoon and we will make further plans based on his final recommendations.  From a perspective of his congestive heart failure, the patient is clinically improved with improvement in his shortness of breath.  He has had no anginal symptoms.  While his aortic stenosis is in the moderately severe range, I have personally reviewed his echo and feel that his aortic valve is severely restricted.  His valve area calculates to approximately 1 cm.  However, with a combination of  aortic stenosis and coronary artery disease and now his clinical presentation with class III symptoms and very high filling pressures, I think proceeding with definitive treatment of his aortic stenosis is indicated.  Sherren Mocha, M.D. 11/16/2019 4:11 PM  Addendum: Dr. Vivi Martens consultation note is reviewed and personally discussed with him.  We will plan on proceeding with atherectomy and PCI of the LAD next week.  I have personally reviewed the risks, indications, and alternatives with the patient.  He understands that this will be performed in staged procedure prior to TAVR.  We will arrange with Dr. Angelena Form  who saw him in formal consultation and performed his diagnostic cardiac catheterization.  The patient prefers to do this later next week if possible.  We will go ahead and start him on clopidogrel 75 mg daily today.  The patient is medically stable for discharge from the hospital today.  We will reach out to our preprocedure nursing staff to set up Covid testing prior to elective outpatient PCI next week.  Sherren Mocha 11/16/2019 6:24 PM

## 2019-11-20 ENCOUNTER — Other Ambulatory Visit (HOSPITAL_COMMUNITY)
Admission: RE | Admit: 2019-11-20 | Discharge: 2019-11-20 | Disposition: A | Discharge status: Home / Self Care | Payer: Medicare Other | Source: Ambulatory Visit | Attending: Cardiovascular Disease | Admitting: Cardiovascular Disease

## 2019-11-20 DIAGNOSIS — Z01818 Encounter for other preprocedural examination: Secondary | ICD-10-CM | POA: Diagnosis not present

## 2019-11-20 DIAGNOSIS — Z20822 Contact with and (suspected) exposure to covid-19: Secondary | ICD-10-CM | POA: Diagnosis not present

## 2019-11-20 LAB — SARS CORONAVIRUS 2 (TAT 6-24 HRS): SARS Coronavirus 2: NEGATIVE

## 2019-11-21 ENCOUNTER — Telehealth: Payer: Self-pay | Admitting: *Deleted

## 2019-11-21 NOTE — Telephone Encounter (Signed)
Reviewed procedure/mask/visitor instructions, COVID-19 questions with patient's wife

## 2019-11-21 NOTE — Telephone Encounter (Addendum)
Pt contacted pre-coronary atherectomy scheduled at [facility] for: Thursday November 22, 2019 12 Noon Verified arrival time and place: [place]) at: 10 AM   No solid food after midnight prior to cath, clear liquids until 5 AM day of procedure.  Hold: Metformin-day of procedure and 48 hours post procedure Insulin AM of procedure/1/2 usual Insulin HS prior to procedure Lasix/KCl-AM of procedure  Except hold medications AM meds can be  taken pre-cath with sips of water including: ASA 81 mg Plavix 75 mg  Confirmed patient has responsible adult to drive home post procedure and be with patient first 24 hours after arriving home: yes  You are allowed ONE visitor in the waiting room during the time you are at the hospital for your procedure. Both you and your visitor must wear a mask once you enter the hospital.       COVID-19 Pre-Screening Questions:  . In the past 14 days have you had a new cough, new headache, new nasal congestion, fever (100.4 or greater) unexplained body aches, new sore throat, or sudden loss of taste or sense of smell? no . In the past 14 days have you been around anyone with known Covid 19? no   Reviewed procedure/mask/visitor instructions, COVID-19 questions with patient's wife.

## 2019-11-21 NOTE — Telephone Encounter (Signed)
Patient's wife returning call. 

## 2019-11-22 ENCOUNTER — Other Ambulatory Visit: Payer: Self-pay

## 2019-11-22 ENCOUNTER — Ambulatory Visit (HOSPITAL_COMMUNITY)
Admission: RE | Disposition: A | Discharge status: Home / Self Care | Payer: Self-pay | Source: Home / Self Care | Attending: Cardiovascular Disease

## 2019-11-22 ENCOUNTER — Encounter (HOSPITAL_COMMUNITY): Payer: Self-pay | Admitting: Cardiovascular Disease

## 2019-11-22 ENCOUNTER — Ambulatory Visit (HOSPITAL_COMMUNITY)
Admission: RE | Admit: 2019-11-22 | Discharge: 2019-11-23 | Disposition: A | Discharge status: Home / Self Care | Payer: Medicare Other | Attending: Cardiovascular Disease | Admitting: Cardiovascular Disease

## 2019-11-22 DIAGNOSIS — R5383 Other fatigue: Secondary | ICD-10-CM | POA: Diagnosis not present

## 2019-11-22 DIAGNOSIS — I110 Hypertensive heart disease with heart failure: Secondary | ICD-10-CM | POA: Diagnosis not present

## 2019-11-22 DIAGNOSIS — K219 Gastro-esophageal reflux disease without esophagitis: Secondary | ICD-10-CM | POA: Diagnosis not present

## 2019-11-22 DIAGNOSIS — I200 Unstable angina: Secondary | ICD-10-CM | POA: Diagnosis present

## 2019-11-22 DIAGNOSIS — Z955 Presence of coronary angioplasty implant and graft: Secondary | ICD-10-CM | POA: Insufficient documentation

## 2019-11-22 DIAGNOSIS — E119 Type 2 diabetes mellitus without complications: Secondary | ICD-10-CM | POA: Insufficient documentation

## 2019-11-22 DIAGNOSIS — G4730 Sleep apnea, unspecified: Secondary | ICD-10-CM | POA: Diagnosis not present

## 2019-11-22 DIAGNOSIS — E785 Hyperlipidemia, unspecified: Secondary | ICD-10-CM | POA: Insufficient documentation

## 2019-11-22 DIAGNOSIS — Z79899 Other long term (current) drug therapy: Secondary | ICD-10-CM | POA: Diagnosis not present

## 2019-11-22 DIAGNOSIS — I350 Nonrheumatic aortic (valve) stenosis: Secondary | ICD-10-CM | POA: Diagnosis not present

## 2019-11-22 DIAGNOSIS — R0602 Shortness of breath: Secondary | ICD-10-CM | POA: Diagnosis not present

## 2019-11-22 DIAGNOSIS — I5033 Acute on chronic diastolic (congestive) heart failure: Secondary | ICD-10-CM | POA: Insufficient documentation

## 2019-11-22 DIAGNOSIS — Z794 Long term (current) use of insulin: Secondary | ICD-10-CM | POA: Diagnosis not present

## 2019-11-22 DIAGNOSIS — R262 Difficulty in walking, not elsewhere classified: Secondary | ICD-10-CM | POA: Diagnosis not present

## 2019-11-22 DIAGNOSIS — I25110 Atherosclerotic heart disease of native coronary artery with unstable angina pectoris: Secondary | ICD-10-CM | POA: Diagnosis not present

## 2019-11-22 DIAGNOSIS — I2510 Atherosclerotic heart disease of native coronary artery without angina pectoris: Secondary | ICD-10-CM | POA: Diagnosis present

## 2019-11-22 DIAGNOSIS — Z7982 Long term (current) use of aspirin: Secondary | ICD-10-CM | POA: Diagnosis not present

## 2019-11-22 DIAGNOSIS — I6523 Occlusion and stenosis of bilateral carotid arteries: Secondary | ICD-10-CM | POA: Insufficient documentation

## 2019-11-22 HISTORY — PX: CORONARY ATHERECTOMY: CATH118238

## 2019-11-22 LAB — BASIC METABOLIC PANEL
Anion gap: 11 (ref 5–15)
BUN: 21 mg/dL (ref 8–23)
CO2: 23 mmol/L (ref 22–32)
Calcium: 8.9 mg/dL (ref 8.9–10.3)
Chloride: 101 mmol/L (ref 98–111)
Creatinine, Ser: 1.27 mg/dL — ABNORMAL HIGH (ref 0.61–1.24)
GFR, Estimated: 57 mL/min — ABNORMAL LOW (ref >60)
Glucose, Bld: 297 mg/dL — ABNORMAL HIGH (ref 70–99)
Potassium: 4.5 mmol/L (ref 3.5–5.1)
Sodium: 135 mmol/L (ref 135–145)

## 2019-11-22 LAB — GLUCOSE, CAPILLARY
Glucose-Capillary: 285 mg/dL — ABNORMAL HIGH (ref 70–99)
Glucose-Capillary: 186 mg/dL — ABNORMAL HIGH (ref 70–99)
Glucose-Capillary: 264 mg/dL — ABNORMAL HIGH (ref 70–99)

## 2019-11-22 LAB — POCT ACTIVATED CLOTTING TIME
Activated Clotting Time: 257 seconds
Activated Clotting Time: 356 seconds
Activated Clotting Time: 158 seconds

## 2019-11-22 SURGERY — CORONARY ATHERECTOMY
Anesthesia: LOCAL

## 2019-11-22 MED ORDER — LABETALOL HCL 5 MG/ML IV SOLN: 10.0000 mg | INTRAVENOUS | Status: AC | PRN

## 2019-11-22 MED ORDER — ISOSORBIDE MONONITRATE ER 30 MG PO TB24
30.0000 mg | ORAL_TABLET | Freq: Every day | ORAL | Status: DC
  Administered 2019-11-22: 30 mg via ORAL
  Filled 2019-11-22: qty 1

## 2019-11-22 MED ORDER — VIPERSLIDE LUBRICANT OPTIME: TOPICAL | Status: DC | PRN

## 2019-11-22 MED ORDER — HEPARIN SODIUM (PORCINE) 1000 UNIT/ML IJ SOLN
INTRAMUSCULAR | Status: AC
  Filled 2019-11-22: qty 1

## 2019-11-22 MED ORDER — LIDOCAINE HCL (PF) 1 % IJ SOLN
INTRAMUSCULAR | Status: DC | PRN
  Administered 2019-11-22: 15 mL

## 2019-11-22 MED ORDER — POTASSIUM CHLORIDE CRYS ER 20 MEQ PO TBCR
20.0000 meq | EXTENDED_RELEASE_TABLET | Freq: Every day | ORAL | Status: DC
  Administered 2019-11-23: 20 meq via ORAL
  Filled 2019-11-22: qty 1

## 2019-11-22 MED ORDER — LIDOCAINE HCL (PF) 1 % IJ SOLN
INTRAMUSCULAR | Status: AC
  Filled 2019-11-22: qty 30

## 2019-11-22 MED ORDER — SODIUM CHLORIDE 0.9 % IV SOLN
250.0000 mL | INTRAVENOUS | Status: DC | PRN
  Administered 2019-11-22: 250 mL via INTRAVENOUS

## 2019-11-22 MED ORDER — FENTANYL CITRATE (PF) 100 MCG/2ML IJ SOLN
INTRAMUSCULAR | Status: DC | PRN
  Administered 2019-11-22 (×3): 25 ug via INTRAVENOUS

## 2019-11-22 MED ORDER — SODIUM CHLORIDE 0.9 % WEIGHT BASED INFUSION
3.0000 mL/kg/h | INTRAVENOUS | Status: DC
  Administered 2019-11-22: 3 mL/kg/h via INTRAVENOUS

## 2019-11-22 MED ORDER — SODIUM CHLORIDE 0.9 % WEIGHT BASED INFUSION: 1.0000 mL/kg/h | INTRAVENOUS | Status: DC

## 2019-11-22 MED ORDER — SODIUM CHLORIDE 0.9 % IV SOLN: INTRAVENOUS | Status: AC

## 2019-11-22 MED ORDER — SODIUM CHLORIDE 0.9% FLUSH
3.0000 mL | Freq: Two times a day (BID) | INTRAVENOUS | Status: DC
  Administered 2019-11-22: 3 mL via INTRAVENOUS

## 2019-11-22 MED ORDER — HEPARIN (PORCINE) IN NACL 1000-0.9 UT/500ML-% IV SOLN
INTRAVENOUS | Status: DC | PRN
  Administered 2019-11-22 (×2): 500 mL

## 2019-11-22 MED ORDER — FUROSEMIDE 20 MG PO TABS
60.0000 mg | ORAL_TABLET | Freq: Every day | ORAL | Status: DC
  Administered 2019-11-23: 60 mg via ORAL
  Filled 2019-11-22: qty 3

## 2019-11-22 MED ORDER — ACETAMINOPHEN 500 MG PO TABS
1000.0000 mg | ORAL_TABLET | Freq: Three times a day (TID) | ORAL | Status: DC
  Administered 2019-11-22 (×2): 1000 mg via ORAL
  Filled 2019-11-22 (×3): qty 2

## 2019-11-22 MED ORDER — HEPARIN SODIUM (PORCINE) 1000 UNIT/ML IJ SOLN
INTRAMUSCULAR | Status: DC | PRN
  Administered 2019-11-22: 12000 [IU] via INTRAVENOUS
  Administered 2019-11-22: 2000 [IU] via INTRAVENOUS

## 2019-11-22 MED ORDER — FENTANYL CITRATE (PF) 100 MCG/2ML IJ SOLN
INTRAMUSCULAR | Status: AC
  Filled 2019-11-22: qty 2

## 2019-11-22 MED ORDER — MIDAZOLAM HCL 2 MG/2ML IJ SOLN
INTRAMUSCULAR | Status: DC | PRN
  Administered 2019-11-22 (×3): 1 mg via INTRAVENOUS

## 2019-11-22 MED ORDER — ASPIRIN EC 81 MG PO TBEC: 81.0000 mg | DELAYED_RELEASE_TABLET | Freq: Every day | ORAL | Status: DC

## 2019-11-22 MED ORDER — DONEPEZIL HCL 5 MG PO TABS
5.0000 mg | ORAL_TABLET | Freq: Every day | ORAL | Status: DC
  Administered 2019-11-22: 5 mg via ORAL
  Filled 2019-11-22: qty 1

## 2019-11-22 MED ORDER — FAMOTIDINE 20 MG PO TABS
20.0000 mg | ORAL_TABLET | Freq: Two times a day (BID) | ORAL | Status: DC
  Administered 2019-11-22 – 2019-11-23 (×2): 20 mg via ORAL
  Filled 2019-11-22 (×2): qty 1

## 2019-11-22 MED ORDER — PAROXETINE HCL 20 MG PO TABS
20.0000 mg | ORAL_TABLET | Freq: Every day | ORAL | Status: DC
  Administered 2019-11-22: 20 mg via ORAL
  Filled 2019-11-22: qty 1

## 2019-11-22 MED ORDER — SODIUM CHLORIDE 0.9% FLUSH: 3.0000 mL | INTRAVENOUS | Status: DC | PRN

## 2019-11-22 MED ORDER — TAMSULOSIN HCL 0.4 MG PO CAPS
0.4000 mg | ORAL_CAPSULE | Freq: Every day | ORAL | Status: DC
  Administered 2019-11-22: 0.4 mg via ORAL
  Filled 2019-11-22: qty 1

## 2019-11-22 MED ORDER — ONDANSETRON HCL 4 MG/2ML IJ SOLN: 4.0000 mg | Freq: Four times a day (QID) | INTRAMUSCULAR | Status: DC | PRN

## 2019-11-22 MED ORDER — NITROGLYCERIN 1 MG/10 ML FOR IR/CATH LAB
INTRA_ARTERIAL | Status: AC
  Filled 2019-11-22: qty 10

## 2019-11-22 MED ORDER — AMLODIPINE BESYLATE 5 MG PO TABS
10.0000 mg | ORAL_TABLET | Freq: Every day | ORAL | Status: DC
  Administered 2019-11-23: 10 mg via ORAL
  Filled 2019-11-22: qty 2

## 2019-11-22 MED ORDER — HYDRALAZINE HCL 20 MG/ML IJ SOLN
10.0000 mg | INTRAMUSCULAR | Status: AC | PRN
  Administered 2019-11-22 (×2): 10 mg via INTRAVENOUS
  Filled 2019-11-22 (×2): qty 1

## 2019-11-22 MED ORDER — ATORVASTATIN CALCIUM 40 MG PO TABS
40.0000 mg | ORAL_TABLET | Freq: Every evening | ORAL | Status: DC
  Administered 2019-11-22: 40 mg via ORAL
  Filled 2019-11-22: qty 1

## 2019-11-22 MED ORDER — CLOPIDOGREL BISULFATE 75 MG PO TABS
75.0000 mg | ORAL_TABLET | Freq: Every day | ORAL | Status: DC
  Administered 2019-11-23: 75 mg via ORAL
  Filled 2019-11-22: qty 1

## 2019-11-22 MED ORDER — IOHEXOL 350 MG/ML SOLN
INTRAVENOUS | Status: AC
  Filled 2019-11-22: qty 1

## 2019-11-22 MED ORDER — INSULIN GLARGINE 100 UNIT/ML ~~LOC~~ SOLN
26.0000 [IU] | Freq: Every day | SUBCUTANEOUS | Status: DC
  Administered 2019-11-22: 26 [IU] via SUBCUTANEOUS
  Filled 2019-11-22 (×3): qty 0.26

## 2019-11-22 MED ORDER — MIDAZOLAM HCL 2 MG/2ML IJ SOLN
INTRAMUSCULAR | Status: AC
  Filled 2019-11-22: qty 2

## 2019-11-22 MED ORDER — INSULIN GLARGINE 100 UNIT/ML SOLOSTAR PEN: 26.0000 [IU] | PEN_INJECTOR | Freq: Every day | SUBCUTANEOUS | Status: DC

## 2019-11-22 MED ORDER — NITROGLYCERIN 0.4 MG SL SUBL: 0.4000 mg | SUBLINGUAL_TABLET | SUBLINGUAL | Status: DC | PRN

## 2019-11-22 MED ORDER — HEPARIN (PORCINE) IN NACL 1000-0.9 UT/500ML-% IV SOLN
INTRAVENOUS | Status: AC
  Filled 2019-11-22: qty 500

## 2019-11-22 MED ORDER — ASPIRIN 81 MG PO CHEW: 81.0000 mg | CHEWABLE_TABLET | ORAL | Status: DC

## 2019-11-22 MED ORDER — SODIUM CHLORIDE 0.9 % IV SOLN: 250.0000 mL | INTRAVENOUS | Status: DC | PRN

## 2019-11-22 MED ORDER — SODIUM CHLORIDE 0.9% FLUSH: 3.0000 mL | Freq: Two times a day (BID) | INTRAVENOUS | Status: DC

## 2019-11-22 SURGICAL SUPPLY — 16 items
BALLN SAPPHIRE 2.0X12 (BALLOONS) ×2
BALLOON SAPPHIRE 2.0X12 (BALLOONS) IMPLANT
CATH VISTA GUIDE 6FR XBLAD3.5 (CATHETERS) ×1 IMPLANT
CROWN DIAMONDBACK CLASSIC 1.25 (BURR) ×1 IMPLANT
KIT ENCORE 26 ADVANTAGE (KITS) ×1 IMPLANT
KIT HEART LEFT (KITS) ×2 IMPLANT
LUBRICANT VIPERSLIDE CORONARY (MISCELLANEOUS) ×1 IMPLANT
PACK CARDIAC CATHETERIZATION (CUSTOM PROCEDURE TRAY) ×2 IMPLANT
SHEATH PINNACLE 6F 10CM (SHEATH) ×1 IMPLANT
SHEATH PROBE COVER 6X72 (BAG) ×1 IMPLANT
STENT RESOLUTE ONYX 2.25X18 (Permanent Stent) ×1 IMPLANT
STENT RESOLUTE ONYX 2.5X12 (Permanent Stent) ×1 IMPLANT
TRANSDUCER W/STOPCOCK (MISCELLANEOUS) ×2 IMPLANT
TUBING CIL FLEX 10 FLL-RA (TUBING) ×2 IMPLANT
WIRE EMERALD 3MM-J .035X150CM (WIRE) ×1 IMPLANT
WIRE VIPERWIRE COR FLEX .012 (WIRE) ×1 IMPLANT

## 2019-11-22 NOTE — Progress Notes (Signed)
Inpatient Diabetes Program Recommendations  AACE/ADA: New Consensus Statement on Inpatient Glycemic Control (2015)  Target Ranges:  Prepandial:   less than 140 mg/dL      Peak postprandial:   less than 180 mg/dL (1-2 hours)      Critically ill patients:  140 - 180 mg/dL   Lab Results  Component Value Date   GLUCAP 186 (H) 11/22/2019   HGBA1C 7.6 (H) 11/14/2019    Review of Glycemic Control Results for [name], [name] "RONNIE" (MRN [id number]) as of 11/22/2019 15:21  Ref. Range 11/22/2019 10:03 11/22/2019 15:15  Glucose-Capillary Latest Ref Range: 70 - 99 mg/dL 285 (H) 186 (H)   Diabetes history:  DM2 Outpatient Diabetes medications:  Dexcom CGM Novolog 015-20 tid Lantus 56 qhs Metformin 1000 mg Current orders for Inpatient glycemic control:  Lantus 26 units qhs  Inpatient Diabetes Program Recommendations:    Please consider glycemic control order set Novolog 0-9 units tid and 0-5 qhs  Will continue to follow while inpatient.  Thank you, Reche Dixon, RN, BSN Diabetes Coordinator Inpatient Diabetes Program [phone] (team pager from 8a-5p)

## 2019-11-22 NOTE — Progress Notes (Signed)
Site area: right groin  Site Prior to Removal:  Level 0  Pressure Applied For 20 MINUTES    Minutes Beginning at 1740  Manual:   Yes.    Patient Status During Pull:  stable  Post Pull Groin Site:  Level 0  Post Pull Instructions Given:  Yes.    Post Pull Pulses Present:  Yes.    Dressing Applied:  Yes.    Comments:  Patients systolic blood pressure increased to 170,s prior to pull and given hydralazine with good effect sbp in 150's. While holding pressure sbp increased again to 170's and a second dose of hydralazine given with sbp decreasing to 150's. Reported off to [place]

## 2019-11-22 NOTE — Interval H&P Note (Signed)
History and Physical Interval Note:  11/22/2019 10:53 AM  [name]  has presented today for surgery, with the diagnosis of cad.  The various methods of treatment have been discussed with the patient and family. After consideration of risks, benefits and other options for treatment, the patient has consented to  Procedure(s): CORONARY ATHERECTOMY (N/A) as a surgical intervention.  The patient's history has been reviewed, patient examined, no change in status, stable for surgery.  I have reviewed the patient's chart and labs.  Questions were answered to the patient's satisfaction.    Cath Lab Visit (complete for each Cath Lab visit)  Clinical Evaluation Leading to the Procedure:   ACS: No.  Non-ACS:    Anginal Classification: CCS III  Anti-ischemic medical therapy: Maximal Therapy (2 or more classes of medications)  Non-Invasive Test Results: No non-invasive testing performed  Prior CABG: No previous CABG        [name]

## 2019-11-23 ENCOUNTER — Ambulatory Visit (HOSPITAL_BASED_OUTPATIENT_CLINIC_OR_DEPARTMENT_OTHER): Payer: Medicare Other

## 2019-11-23 ENCOUNTER — Encounter (HOSPITAL_COMMUNITY): Payer: Self-pay | Admitting: Cardiovascular Disease

## 2019-11-23 DIAGNOSIS — I350 Nonrheumatic aortic (valve) stenosis: Secondary | ICD-10-CM | POA: Diagnosis not present

## 2019-11-23 DIAGNOSIS — E119 Type 2 diabetes mellitus without complications: Secondary | ICD-10-CM | POA: Diagnosis not present

## 2019-11-23 DIAGNOSIS — Z01810 Encounter for preprocedural cardiovascular examination: Secondary | ICD-10-CM

## 2019-11-23 DIAGNOSIS — Z794 Long term (current) use of insulin: Secondary | ICD-10-CM | POA: Diagnosis not present

## 2019-11-23 DIAGNOSIS — R5383 Other fatigue: Secondary | ICD-10-CM | POA: Diagnosis not present

## 2019-11-23 DIAGNOSIS — I25110 Atherosclerotic heart disease of native coronary artery with unstable angina pectoris: Secondary | ICD-10-CM | POA: Diagnosis not present

## 2019-11-23 DIAGNOSIS — Z7982 Long term (current) use of aspirin: Secondary | ICD-10-CM | POA: Diagnosis not present

## 2019-11-23 DIAGNOSIS — I5033 Acute on chronic diastolic (congestive) heart failure: Secondary | ICD-10-CM | POA: Diagnosis not present

## 2019-11-23 DIAGNOSIS — E785 Hyperlipidemia, unspecified: Secondary | ICD-10-CM | POA: Diagnosis not present

## 2019-11-23 DIAGNOSIS — G4730 Sleep apnea, unspecified: Secondary | ICD-10-CM | POA: Diagnosis not present

## 2019-11-23 DIAGNOSIS — R0602 Shortness of breath: Secondary | ICD-10-CM | POA: Diagnosis not present

## 2019-11-23 DIAGNOSIS — I6523 Occlusion and stenosis of bilateral carotid arteries: Secondary | ICD-10-CM | POA: Diagnosis not present

## 2019-11-23 DIAGNOSIS — K219 Gastro-esophageal reflux disease without esophagitis: Secondary | ICD-10-CM | POA: Diagnosis not present

## 2019-11-23 DIAGNOSIS — Z79899 Other long term (current) drug therapy: Secondary | ICD-10-CM | POA: Diagnosis not present

## 2019-11-23 DIAGNOSIS — R262 Difficulty in walking, not elsewhere classified: Secondary | ICD-10-CM | POA: Diagnosis not present

## 2019-11-23 DIAGNOSIS — I110 Hypertensive heart disease with heart failure: Secondary | ICD-10-CM | POA: Diagnosis not present

## 2019-11-23 DIAGNOSIS — Z955 Presence of coronary angioplasty implant and graft: Secondary | ICD-10-CM | POA: Diagnosis not present

## 2019-11-23 LAB — BASIC METABOLIC PANEL
Anion gap: 8 (ref 5–15)
BUN: 17 mg/dL (ref 8–23)
CO2: 24 mmol/L (ref 22–32)
Calcium: 8.9 mg/dL (ref 8.9–10.3)
Chloride: 105 mmol/L (ref 98–111)
Creatinine, Ser: 1.08 mg/dL (ref 0.61–1.24)
GFR, Estimated: >60 mL/min (ref >60)
Glucose, Bld: 336 mg/dL — ABNORMAL HIGH (ref 70–99)
Potassium: 4.1 mmol/L (ref 3.5–5.1)
Sodium: 137 mmol/L (ref 135–145)

## 2019-11-23 LAB — CBC
HCT: 39.3 % (ref 39.0–52.0)
Hemoglobin: 12.5 g/dL — ABNORMAL LOW (ref 13.0–17.0)
MCH: 28 pg (ref 26.0–34.0)
MCHC: 31.8 g/dL (ref 30.0–36.0)
MCV: 87.9 fL (ref 80.0–100.0)
Platelets: 298 10*3/uL (ref 150–400)
RBC: 4.47 MIL/uL (ref 4.22–5.81)
RDW: 15.2 % (ref 11.5–15.5)
WBC: 5.6 10*3/uL (ref 4.0–10.5)
nRBC: 0 % (ref 0.0–0.2)

## 2019-11-23 LAB — GLUCOSE, CAPILLARY
Glucose-Capillary: 364 mg/dL — ABNORMAL HIGH (ref 70–99)
Glucose-Capillary: 147 mg/dL — ABNORMAL HIGH (ref 70–99)

## 2019-11-23 MED ORDER — INSULIN ASPART 100 UNIT/ML ~~LOC~~ SOLN
0.0000 [IU] | Freq: Three times a day (TID) | SUBCUTANEOUS | Status: DC
  Administered 2019-11-23: 15 [IU] via SUBCUTANEOUS

## 2019-11-23 MED ORDER — INSULIN ASPART 100 UNIT/ML ~~LOC~~ SOLN: 0.0000 [IU] | Freq: Every day | SUBCUTANEOUS | Status: DC

## 2019-11-23 MED ORDER — INSULIN ASPART 100 UNIT/ML ~~LOC~~ SOLN
10.0000 [IU] | Freq: Once | SUBCUTANEOUS | Status: AC
  Administered 2019-11-23: 10 [IU] via SUBCUTANEOUS

## 2019-11-23 MED FILL — Nitroglycerin IV Soln 100 MCG/ML in D5W: INTRA_ARTERIAL | Qty: 10 | Status: AC

## 2019-11-23 NOTE — Discharge Summary (Addendum)
Discharge Summary    Patient ID: [name] MRN: [id number]; DOB: February 03, 1940  Admit date: 11/22/2019 Discharge date: 11/23/2019  Primary Care Provider: Dettinger, Fransisca Kaufmann, MD  Primary Cardiologist: Rozann Lesches, MD    Discharge Diagnoses    Principal Problem:   CORONARY ATHEROSCLEROSIS NATIVE CORONARY ARTERY Active Problems:   Unstable angina ([place])   Severe AS, undergoing TVAR work up   DM   Diagnostic Studies/Procedures    CORONARY ATHERECTOMY 11/22/2019  Conclusion    Prox LAD to Mid LAD lesion is 99% stenosed.  2nd Diag lesion is 90% stenosed.  Mid LAD lesion is 99% stenosed.  A drug-eluting stent was successfully placed using a STENT RESOLUTE ONYX 2.25X18.  Post intervention, there is a 0% residual stenosis.  A drug-eluting stent was successfully placed using a STENT RESOLUTE ONYX 2.5X12.  Post intervention, there is a 0% residual stenosis.   1. Severe serial stenoses in the mid LAD. 2. Successful PTCA/DES x 2 (non-overlapping) in the mid LAD  Recommendations: Will continue ASA and Plavix. Will monitor overnight. Likely discharge home tomorrow. Will continue TAVR workup as an outpatient.   Diagnostic Dominance: Left  Intervention      History of Present Illness     [name] is a 79 y.o. male with with history of CAD, HTN, GERD, hyperlipidemia, sleep apnea, DM on insulin, chronic diastolic CHF and aortic stenosis (undergoing TVAR work up) presented for outpatient cath.   He is known to have CAD with last cardiac cath in 2006 at which time a bare metal stent was placed in the RCA. No cardiac cath since then. He has been followed for moderate aortic stenosis. Most recent echocardiogram 10/31/19 with LVEF=60-65%, mild LVH. The aortic valve leaflets are thickened and calcified with limited leaflet excursion. Mean gradient 23.2 mmHg, peak gradient 41.2 mmHg, AVA 0.98 cm2, dimensionless index 0.28. His echo parameters suggest moderate  to severe AS.He was seen recently by Dr. Domenic Polite and described worsened dyspnea on exertion and progressive fatigue.  He presented for outpatient cath 11/14/2019 which showed showed three vessel CAD, elevated filling pressures and moderately severe AS ( mean gradient 26.6 mmHg, peak to peak gradient 31 mmHg, AVA 1.06 cm2) .He was admitted after the cath for further evaluation. He was treated with IV lasix. He was seen by multi decipilanary structural heart team. Started on Plavix and discharged with  on proceeding with atherectomy and PCI of the LAD and TVAR as outpatient on 11/16/19.  Hospital Course     Consultants: None  Outpatient cardiac cath showed severe serial stenoses in the mid LAD s/p successful PTCA/DES x 2 (non-overlapping) in the mid LAD. No overnight complications. Cath groin site stable. Ambulated well. He will continue outpatient TVAR work up who will arrange outpatient follow up (spoke with the staff). No BB given baseline bradycardia in 50s.   The patient been seen by Dr. Dr. Claiborne Billings today and deemed ready for discharge home. All follow-up appointments have been scheduled. Discharge medications are listed below.    Did the patient have an acute coronary syndrome (MI, NSTEMI, STEMI, etc) this admission?:  No                               Did the patient have a percutaneous coronary intervention (stent / angioplasty)?:  Yes.     Cath/PCI Registry Performance & Quality Measures: 1. Aspirin prescribed? - Yes 2. ADP Receptor Inhibitor (Plavix/Clopidogrel, Brilinta/Ticagrelor  or Effient/Prasugrel) prescribed (includes medically managed patients)? - Yes 3. High Intensity Statin (Lipitor 40-80mg  or Crestor 20-40mg ) prescribed? - Yes 4. For EF <40%, was ACEI/ARB prescribed? - Not Applicable (EF >/= 06%) 5. For EF <40%, Aldosterone Antagonist (Spironolactone or Eplerenone) prescribed? - Not Applicable (EF >/= 26%) 6. Cardiac Rehab Phase II ordered? - Yes   _____________  Discharge  Vitals Blood pressure 111/83, pulse 71, temperature 98 F (36.7 C), temperature source Oral, resp. rate 18, height 5\' 6"  (1.676 m), weight 102.5 kg, SpO2 97 %.  Filed Weights   11/22/19 1001 11/23/19 0351  Weight: 103.4 kg 102.5 kg   Physical Exam Constitutional:      Appearance: Normal appearance.  HENT:     Head: Normocephalic.     Nose: Nose normal.  Eyes:     Extraocular Movements: Extraocular movements intact.     Pupils: Pupils are equal, round, and reactive to light.  Cardiovascular:     Rate and Rhythm: Normal rate and regular rhythm.     Comments: Right groin without hematoma or bruise  Pulmonary:     Effort: Pulmonary effort is normal.     Breath sounds: Normal breath sounds.  Abdominal:     General: Abdomen is flat.     Palpations: Abdomen is soft.  Musculoskeletal:        General: Normal range of motion.     Cervical back: Normal range of motion.  Skin:    General: Skin is warm and dry.  Neurological:     General: No focal deficit present.     Mental Status: He is alert and oriented to person, place, and time.  Psychiatric:        Mood and Affect: Mood normal.        Behavior: Behavior normal.    Labs & Radiologic Studies    CBC Recent Labs    11/23/19 0314  WBC 5.6  HGB 12.5*  HCT 39.3  MCV 87.9  PLT 948   Basic Metabolic Panel Recent Labs    11/22/19 0951 11/23/19 0314  NA 135 137  K 4.5 4.1  CL 101 105  CO2 23 24  GLUCOSE 297* 336*  BUN 21 17  CREATININE 1.27* 1.08  CALCIUM 8.9 8.9  _____________  CARDIAC CATHETERIZATION  Result Date: 11/22/2019  Prox LAD to Mid LAD lesion is 99% stenosed.  2nd Diag lesion is 90% stenosed.  Mid LAD lesion is 99% stenosed.  A drug-eluting stent was successfully placed using a STENT RESOLUTE ONYX 2.25X18.  Post intervention, there is a 0% residual stenosis.  A drug-eluting stent was successfully placed using a STENT RESOLUTE ONYX 2.5X12.  Post intervention, there is a 0% residual stenosis.  1.  Severe serial stenoses in the mid LAD. 2. Successful PTCA/DES x 2 (non-overlapping) in the mid LAD Recommendations: Will continue ASA and Plavix. Will monitor overnight. Likely discharge home tomorrow. Will continue TAVR workup as an outpatient.   CARDIAC CATHETERIZATION  Result Date: 11/14/2019  Prox RCA-1 lesion is 70% stenosed.  Prox RCA-2 lesion is 95% stenosed.  LPAV lesion is 90% stenosed.  Lat 3rd Mrg lesion is 80% stenosed.  Prox Cx to Mid Cx lesion is 30% stenosed.  Prox LAD to Mid LAD lesion is 99% stenosed.  Mid LAD lesion is 99% stenosed.  2nd Diag lesion is 90% stenosed.  1. Severe proximal LAD stenosis, calcified. Severe mid LAD stenosis at the takeoff of the moderate caliber diagonal branch, calcified. 2. Severe distal AV groove  stenosis in the left dominant Circumflex. Severe OM 2 stenosis. 3. Moderate caliber non-dominant RCA with patent proximal stent with moderately severe restenosis. Severe mid RCA stenosis. 4. Moderately severe to severe aortic stenosis (mean gradient 26.6 mmHg, peak to peak gradient 31 mmHg, AVA 1.06 cm2). Recommendations: He has moderately severe to severe AS and three vessel CAD. I will admit him to telemetry. Given elevated pressures, will begin IV Lasix. I will ask our CT surgery team to see him to discuss CABG/surgical AVR vs staged PCI and TAVR. Will obtain pre-TAVR CT scans to fully assess his aorta and access. In regards to bypass, the RCA is non-dominant. I think he would benefit from bypass of the LAD and potentially the OM branch and left sided PDA. If he is not felt to be a candidate for CABG, we would plan staged PCI of the LAD which would require atherectomy prior to stent placement. The LAD is calcified. The proximal LAD stenosis could be easily treated with orbital atherectomy and stenting. The mid LAD lesion is at the takeoff of a moderate caliber diagonal branch which also has a severe ostial stenosis. This would be higher risk in regards to  potentially losing flow down that diagonal branch post stenting of the LAD. He has mild memory issues but is overall functional at baseline, recently limited by dyspnea with exertion. He may be a candidate for open surgery with AVR and bypass.   CT CORONARY MORPH W/CTA COR W/SCORE W/CA W/CM &/OR WO/CM  Addendum Date: 11/16/2019   ADDENDUM REPORT: 11/16/2019 11:10 CLINICAL DATA:  Severe Aortic Stenosis. EXAM: Cardiac TAVR CT TECHNIQUE: The patient was scanned on a [employer]. A 120 kV retrospective scan was triggered in the descending thoracic aorta at 111 HU's. Gantry rotation speed was 250 msecs and collimation was .6 mm. No beta blockade or nitro were given. The 3D data set was reconstructed in 5% intervals of the R-R cycle. Systolic and diastolic phases were analyzed on a dedicated work station using MPR, MIP and VRT modes. The patient received 80 cc of contrast. FINDINGS: Image quality: Excellent. Noise artifact is: Limited. Valve Morphology: The aortic valve is tricuspid with severe thickening and severe calcifications. There NCC is contains diffuse bulky leaflet calcifications. There is limited movement in systole of the leaflets. Aortic Valve Calcium score: 2012 Aortic annular dimension: Phase assessed: 25% Annular area: 516 mm2 Annular perimeter: 81.7 mm Max diameter: 28.2 mm Min diameter: 24.2 mm Annular and subannular calcification: None. Optimal coplanar projection: RAO 3 CAU 6 Coronary Artery Height above Annulus: Left Main: 13.0 mm Right Coronary: 16.5 mm Sinus of Valsalva Measurements: Non-coronary: 39 mm Right-coronary: 38 mm Left-coronary: 37 mm Sinus of Valsalva Height: Non-coronary: 28.6 mm Right-coronary: 22.4 mm Left-coronary: 20.9 mm Sinotubular Junction: 37 mm. Ascending Thoracic Aorta: 40 mm. Coronary Arteries: Normal coronary origin. Left dominance. The study was performed without use of NTG and is insufficient for plaque evaluation. Please refer to recent cardiac  catheterization for coronary assessment. Severe 3-vessel calcifications noted. Cardiac Morphology: Right Atrium: Right atrial size is within normal limits. Right Ventricle: The right ventricular cavity is within normal limits. Left Atrium: Left atrial size is normal in size with no left atrial appendage filling defect. Left Ventricle: The ventricular cavity size is within normal limits. There are no stigmata of prior infarction. There is no abnormal filling defect. Normal left ventricular function, LVEF=67%. No regional wall motion abnormalities. Pulmonary arteries: Dilated pulmonary artery suggestive of pulmonary hypertension. Pulmonary veins: Normal pulmonary  venous drainage. Pericardium: Normal thickness with no significant effusion or calcium present. Mitral Valve: The mitral valve is normal structure without significant calcification. Extra-cardiac findings: See attached radiology report for non-cardiac structures. IMPRESSION: 1. Tricuspid aortic valve with severe aortic stenosis (calcium score = 2012). 2. Bulky calcifications noted in the [place]. 3. Annular measurements appropriate for 26 mm Edwards Sapien 3 TAVR. 4. No significant annular or subannular calcifications. 5. Sufficient coronary to annulus distance. 6. Optimal Fluoroscopic Angle for Delivery: RAO 3 CAU 6 7. Dilated pulmonary artery suggestive of pulmonary hypertension. 8. Severe 3-vessel coronary calcifications. [place] T. Audie Box, MD Electronically Signed   By: Eleonore Chiquito   On: 11/16/2019 11:10   Result Date: 11/16/2019 EXAM: OVER-READ INTERPRETATION  CT CHEST The following report is an over-read performed by radiologist Dr. Vinnie Langton of [facility], [place] on 11/16/2019. This over-read does not include interpretation of cardiac or coronary anatomy or pathology. The coronary calcium score/coronary CTA interpretation by the cardiologist is attached. COMPARISON:  Chest CT 12/18/2017. FINDINGS: Extracardiac findings will be described  separately under dictation for contemporaneously obtained CTA chest, abdomen and pelvis. IMPRESSION: Please see separate dictation for contemporaneously obtained CTA chest, abdomen and pelvis 11/15/2019 for full description of relevant extracardiac findings. Electronically Signed: By: Vinnie Langton M.D. On: 11/16/2019 07:29   CT ANGIO CHEST AORTA W/CM & OR WO/CM  Result Date: 11/16/2019 CLINICAL DATA:  79 year old male with history of severe aortic stenosis. Preprocedural study prior to potential transcatheter aortic valve replacement (TAVR) procedure. EXAM: CT ANGIOGRAPHY CHEST, ABDOMEN AND PELVIS TECHNIQUE: Non-contrast CT of the chest was initially obtained. Multidetector CT imaging through the chest, abdomen and pelvis was performed using the standard protocol during bolus administration of intravenous contrast. Multiplanar reconstructed images and MIPs were obtained and reviewed to evaluate the vascular anatomy. CONTRAST:  181mL OMNIPAQUE IOHEXOL 350 MG/ML SOLN COMPARISON:  Chest CT 12/18/2017. CT the abdomen and pelvis 03/11/2008. FINDINGS: CTA CHEST FINDINGS Cardiovascular: Heart size is normal. There is no significant pericardial fluid, thickening or pericardial calcification. There is aortic atherosclerosis, as well as atherosclerosis of the great vessels of the mediastinum and the coronary arteries, including calcified atherosclerotic plaque in the left main, left anterior descending, left circumflex and right coronary arteries. Severe thickening calcification of the aortic valve. Mediastinum/Lymph Nodes: No pathologically enlarged mediastinal or hilar lymph nodes. Esophagus is unremarkable in appearance. No axillary lymphadenopathy. Lungs/Pleura: Calcified pleural plaques in the thorax bilaterally, indicative of asbestos related pleural disease. No acute consolidative airspace disease. No pleural effusions. No suspicious appearing pulmonary nodules or masses are noted. Musculoskeletal/Soft Tissues:  There are no aggressive appearing lytic or blastic lesions noted in the visualized portions of the skeleton. CTA ABDOMEN AND PELVIS FINDINGS Hepatobiliary: No suspicious cystic or solid hepatic lesions. No intra or extrahepatic biliary ductal dilatation. Status post cholecystectomy. Pancreas: No pancreatic mass. No pancreatic ductal dilatation. No pancreatic or peripancreatic fluid collections or inflammatory changes. Spleen: Unremarkable. Adrenals/Urinary Tract: Low-attenuation lesions in the kidneys bilaterally, largest of which are compatible with simple cysts, measuring up to 3.0 x 2.2 cm in the interpolar region of the left kidney. Other subcentimeter low-attenuation lesions in both kidneys, too small to definitively characterize, but statistically likely to represent tiny cysts. No hydroureteronephrosis. Urinary bladder is unremarkable in appearance. Bilateral adrenal glands are normal in appearance. Stomach/Bowel: Normal appearance of the stomach. Small periampullary duodenal diverticulum, without surrounding inflammatory changes to suggest an associated diverticulitis at this time. No pathologic dilatation of small bowel or colon. Normal appendix. Vascular/Lymphatic:  Aortic atherosclerosis, without evidence of aneurysm or dissection in the abdominal or pelvic vasculature. No lymphadenopathy noted in the abdomen or pelvis. Reproductive: Prostate gland and seminal vesicles are unremarkable in appearance. Other: No significant volume of ascites.  No pneumoperitoneum. Musculoskeletal: Status post bilateral hip arthroplasty. There are no aggressive appearing lytic or blastic lesions noted in the visualized portions of the skeleton. VASCULAR MEASUREMENTS PERTINENT TO TAVR: AORTA: Minimal Aortic Diameter-14 x 12 mm Severity of Aortic Calcification-severe RIGHT PELVIS: Right Common Iliac Artery - Minimal Diameter-10.5 x 8.5 mm Tortuosity-mild-to-moderate Calcification-moderate Right External Iliac Artery - Minimal  Diameter-7.6 x 8.0 mm Tortuosity-moderate to severe Calcification-none Right Common Femoral Artery - Minimal Diameter-7.0 x 8.4 mm Tortuosity-mild Calcification-none LEFT PELVIS: Left Common Iliac Artery - Minimal Diameter-9.2 x 8.4 mm Tortuosity-mild-to-moderate Calcification-moderate Left External Iliac Artery - Minimal Diameter-8.8 x 7.6 mm Tortuosity-mild Calcification-none Left Common Femoral Artery - Minimal Diameter-8.5 x 7.9 mm Tortuosity-mild Calcification-none Review of the MIP images confirms the above findings. IMPRESSION: 1. Vascular findings and measurements pertinent to potential TAVR procedure, as detailed above. 2. Severe thickening calcification of the aortic valve, compatible with reported clinical history of severe aortic stenosis. 3. Aortic atherosclerosis, in addition to left main and 3 vessel coronary artery disease. Assessment for potential risk factor modification, dietary therapy or pharmacologic therapy may be warranted, if clinically indicated. 4. Calcified pleural plaques in the thorax bilaterally compatible with asbestos related pleural disease. 5. Additional incidental findings, as above. Electronically Signed   By: Vinnie Langton M.D.   On: 11/16/2019 11:09   ECHOCARDIOGRAM COMPLETE  Result Date: 10/31/2019    ECHOCARDIOGRAM REPORT   Patient Name:   [name] Date of Exam: 10/31/2019 Medical Rec #:  [id number]          Height:       66.0 in Accession #:    [id number]         Weight:       238.0 lb Date of Birth:  1940/05/07           BSA:          2.153 m Patient Age:    79 years           BP:           150/70 mmHg Patient Gender: M                  HR:           84 bpm. Exam Location:  [place] Procedure: 2D Echo, Cardiac Doppler and Color Doppler Indications:     I35.9 AVD  History:         Patient has prior history of Echocardiogram examinations, most                  recent 06/21/2018. Aortic Valve Disease, Signs/Symptoms:Murmur                  and Shortness of Breath;  Risk Factors:Morbidly obese,                  Hypertension, Diabetes, Dyslipidemia and Former Smoker.  Sonographer:     Jeneen Montgomery RDMS, RVT, RDCS Referring Phys:  [place] Diagnosing Phys: Rozann Lesches MD IMPRESSIONS  1. Left ventricular ejection fraction, by estimation, is 60 to 65%. The left ventricle has normal function. The left ventricle has no regional wall motion abnormalities. There is mild left ventricular hypertrophy. Left ventricular diastolic parameters are indeterminate.  2. Right  ventricular systolic function is normal. The right ventricular size is normal. Tricuspid regurgitation signal is inadequate for assessing PA pressure.  3. The mitral valve is grossly normal with mild annular calcification. Trivial mitral valve regurgitation.  4. The aortic valve is tricuspid. There is moderate calcification of the aortic valve. Aortic valve regurgitation is not visualized. Moderate to severe aortic valve stenosis. Aortic valve mean gradient measures 23.2 mmHg. Aortic valve Vmax measures 3.21  m/s. Dimentionless index 0.28.  5. Aortic dilatation noted. There is borderline dilatation of the ascending aorta, measuring 40 mm.  6. Unable to estimate CVP. FINDINGS  Left Ventricle: Left ventricular ejection fraction, by estimation, is 60 to 65%. The left ventricle has normal function. The left ventricle has no regional wall motion abnormalities. The left ventricular internal cavity size was normal in size. There is  mild left ventricular hypertrophy. Left ventricular diastolic parameters are indeterminate. Right Ventricle: The right ventricular size is normal. No increase in right ventricular wall thickness. Right ventricular systolic function is normal. Tricuspid regurgitation signal is inadequate for assessing PA pressure. Left Atrium: Left atrial size was normal in size. Right Atrium: Right atrial size was normal in size. Pericardium: There is no evidence of pericardial effusion. Mitral  Valve: The mitral valve is grossly normal. Mild mitral annular calcification. Trivial mitral valve regurgitation. Tricuspid Valve: The tricuspid valve is grossly normal. Tricuspid valve regurgitation is mild. Aortic Valve: The aortic valve is tricuspid. There is moderate calcification of the aortic valve. There is moderate aortic valve annular calcification. Aortic valve regurgitation is not visualized. Moderate to severe aortic stenosis is present. Aortic valve mean gradient measures 23.2 mmHg. Aortic valve peak gradient measures 41.2 mmHg. Aortic valve area, by VTI measures 0.98 cm. Pulmonic Valve: The pulmonic valve was grossly normal. Pulmonic valve regurgitation is trivial. Aorta: Aortic dilatation noted. There is borderline dilatation of the ascending aorta, measuring 40 mm. Venous: Unable to estimate CVP. IVC assessment for right atrial pressure unable to be performed due to mechanical ventilation. IAS/Shunts: The interatrial septum was not well visualized.  LEFT VENTRICLE PLAX 2D LVIDd:         5.37 cm      Diastology LVIDs:         3.11 cm      LV e' medial:    6.87 cm/s LV PW:         1.20 cm      LV E/e' medial:  10.9 LV IVS:        1.16 cm      LV e' lateral:   5.71 cm/s LVOT diam:     2.10 cm      LV E/e' lateral: 13.2 LV SV:         56 LV SV Index:   26 LVOT Area:     3.46 cm  LV Volumes (MOD) LV vol d, MOD A2C: 93.9 ml LV vol d, MOD A4C: 120.0 ml LV vol s, MOD A2C: 37.0 ml LV vol s, MOD A4C: 50.0 ml LV SV MOD A2C:     56.9 ml LV SV MOD A4C:     120.0 ml LV SV MOD BP:      64.7 ml RIGHT VENTRICLE RV S prime:     17.40 cm/s TAPSE (M-mode): 2.3 cm LEFT ATRIUM             Index LA diam:        3.00 cm 1.39 cm/m LA Vol (A2C):   41.9 ml 19.46 ml/m  LA Vol (A4C):   60.9 ml 28.28 ml/m LA Biplane Vol:         23.90 ml/m  AORTIC VALVE AV Area (Vmax):    1.01 cm AV Area (Vmean):   1.03 cm AV Area (VTI):     0.98 cm AV Vmax:           320.80 cm/s AV Vmean:          221.600 cm/s AV VTI:            0.578 m  AV Peak Grad:      41.2 mmHg AV Mean Grad:      23.2 mmHg LVOT Vmax:         93.80 cm/s LVOT Vmean:        66.200 cm/s LVOT VTI:          0.163 m LVOT/AV VTI ratio: 0.28  AORTA Ao Root diam: 3.70 cm Ao Asc diam:  4.00 cm MITRAL VALVE               TRICUSPID VALVE MV Area (PHT):             TR Peak grad:   7.0 mmHg MV Decel Time: 209 msec    TR Vmax:        132.00 cm/s MR Peak grad: 10.1 mmHg MR Vmax:      159.00 cm/s  SHUNTS MV E velocity: 75.20 cm/s  Systemic VTI:  0.16 m MV A velocity: 85.90 cm/s  Systemic Diam: 2.10 cm MV E/A ratio:  0.88 Rozann Lesches MD Electronically signed by Rozann Lesches MD Signature Date/Time: 10/31/2019/11:33:28 AM    Final    CT Angio Abd/Pel w/ and/or w/o  Result Date: 11/16/2019 CLINICAL DATA:  79 year old male with history of severe aortic stenosis. Preprocedural study prior to potential transcatheter aortic valve replacement (TAVR) procedure. EXAM: CT ANGIOGRAPHY CHEST, ABDOMEN AND PELVIS TECHNIQUE: Non-contrast CT of the chest was initially obtained. Multidetector CT imaging through the chest, abdomen and pelvis was performed using the standard protocol during bolus administration of intravenous contrast. Multiplanar reconstructed images and MIPs were obtained and reviewed to evaluate the vascular anatomy. CONTRAST:  114mL OMNIPAQUE IOHEXOL 350 MG/ML SOLN COMPARISON:  Chest CT 12/18/2017. CT the abdomen and pelvis 03/11/2008. FINDINGS: CTA CHEST FINDINGS Cardiovascular: Heart size is normal. There is no significant pericardial fluid, thickening or pericardial calcification. There is aortic atherosclerosis, as well as atherosclerosis of the great vessels of the mediastinum and the coronary arteries, including calcified atherosclerotic plaque in the left main, left anterior descending, left circumflex and right coronary arteries. Severe thickening calcification of the aortic valve. Mediastinum/Lymph Nodes: No pathologically enlarged mediastinal or hilar lymph nodes. Esophagus  is unremarkable in appearance. No axillary lymphadenopathy. Lungs/Pleura: Calcified pleural plaques in the thorax bilaterally, indicative of asbestos related pleural disease. No acute consolidative airspace disease. No pleural effusions. No suspicious appearing pulmonary nodules or masses are noted. Musculoskeletal/Soft Tissues: There are no aggressive appearing lytic or blastic lesions noted in the visualized portions of the skeleton. CTA ABDOMEN AND PELVIS FINDINGS Hepatobiliary: No suspicious cystic or solid hepatic lesions. No intra or extrahepatic biliary ductal dilatation. Status post cholecystectomy. Pancreas: No pancreatic mass. No pancreatic ductal dilatation. No pancreatic or peripancreatic fluid collections or inflammatory changes. Spleen: Unremarkable. Adrenals/Urinary Tract: Low-attenuation lesions in the kidneys bilaterally, largest of which are compatible with simple cysts, measuring up to 3.0 x 2.2 cm in the interpolar region of the left kidney. Other subcentimeter low-attenuation lesions in both kidneys, too  small to definitively characterize, but statistically likely to represent tiny cysts. No hydroureteronephrosis. Urinary bladder is unremarkable in appearance. Bilateral adrenal glands are normal in appearance. Stomach/Bowel: Normal appearance of the stomach. Small periampullary duodenal diverticulum, without surrounding inflammatory changes to suggest an associated diverticulitis at this time. No pathologic dilatation of small bowel or colon. Normal appendix. Vascular/Lymphatic: Aortic atherosclerosis, without evidence of aneurysm or dissection in the abdominal or pelvic vasculature. No lymphadenopathy noted in the abdomen or pelvis. Reproductive: Prostate gland and seminal vesicles are unremarkable in appearance. Other: No significant volume of ascites.  No pneumoperitoneum. Musculoskeletal: Status post bilateral hip arthroplasty. There are no aggressive appearing lytic or blastic lesions noted  in the visualized portions of the skeleton. VASCULAR MEASUREMENTS PERTINENT TO TAVR: AORTA: Minimal Aortic Diameter-14 x 12 mm Severity of Aortic Calcification-severe RIGHT PELVIS: Right Common Iliac Artery - Minimal Diameter-10.5 x 8.5 mm Tortuosity-mild-to-moderate Calcification-moderate Right External Iliac Artery - Minimal Diameter-7.6 x 8.0 mm Tortuosity-moderate to severe Calcification-none Right Common Femoral Artery - Minimal Diameter-7.0 x 8.4 mm Tortuosity-mild Calcification-none LEFT PELVIS: Left Common Iliac Artery - Minimal Diameter-9.2 x 8.4 mm Tortuosity-mild-to-moderate Calcification-moderate Left External Iliac Artery - Minimal Diameter-8.8 x 7.6 mm Tortuosity-mild Calcification-none Left Common Femoral Artery - Minimal Diameter-8.5 x 7.9 mm Tortuosity-mild Calcification-none Review of the MIP images confirms the above findings. IMPRESSION: 1. Vascular findings and measurements pertinent to potential TAVR procedure, as detailed above. 2. Severe thickening calcification of the aortic valve, compatible with reported clinical history of severe aortic stenosis. 3. Aortic atherosclerosis, in addition to left main and 3 vessel coronary artery disease. Assessment for potential risk factor modification, dietary therapy or pharmacologic therapy may be warranted, if clinically indicated. 4. Calcified pleural plaques in the thorax bilaterally compatible with asbestos related pleural disease. 5. Additional incidental findings, as above. Electronically Signed   By: Vinnie Langton M.D.   On: 11/16/2019 11:09   Disposition   Pt is being discharged home today in good condition.  Follow-up Plans & Appointments     Follow-up Information    Burnell Blanks, MD Follow up.   Specialty: Cardiology Why: office will call with TVAR instructions Contact information: [place] [place] [address] [phone]              Discharge Instructions    Diet - low sodium heart  healthy   Complete by: As directed    Discharge instructions   Complete by: As directed    No driving for 48 hours. No lifting over 5 lbs for 1 week. No sexual activity for 1 week. Keep procedure site clean & dry. If you notice increased pain, swelling, bleeding or pus, call/return!  You may shower, but no soaking baths/hot tubs/pools for 1 week. Hold metformin. Restart 11/25/19.   Increase activity slowly   Complete by: As directed       Discharge Medications   Allergies as of 11/23/2019      Reactions   Ace Inhibitors Hives, Swelling, Rash   Rash,hives,tongue swelling   Angiotensin Receptor Blockers    Unknown reaction    Ciprofloxacin Other (See Comments)   confusion   Oxycodone-acetaminophen Other (See Comments)   Unknown reaction   Robaxin [methocarbamol] Other (See Comments)   Confusion    Toradol [ketorolac Tromethamine] Other (See Comments)   confusion   Valium Other (See Comments)   Hallucinations; confusion   Doxycycline Hives, Rash      Medication List    TAKE these medications  acetaminophen 650 MG CR tablet Commonly known as: TYLENOL Take 1,300 mg by mouth in the morning, at noon, and at bedtime.   amLODipine 10 MG tablet Commonly known as: NORVASC Take 1 tablet (10 mg total) by mouth daily.   aspirin EC 81 MG tablet Take 81 mg by mouth at bedtime.   atorvastatin 40 MG tablet Commonly known as: LIPITOR Take 1 tablet (40 mg total) by mouth every evening.   butalbital-acetaminophen-caffeine 50-325-40 MG tablet Commonly known as: FIORICET Take 1 tablet by mouth 2 (two) times daily as needed for headache.   cetirizine 10 MG tablet Commonly known as: ZYRTEC Take 10 mg by mouth daily with supper.   clopidogrel 75 MG tablet Commonly known as: PLAVIX Take 1 tablet (75 mg total) by mouth daily.   Dexcom G6 Receiver Devi USE TO CHECK BLOOD SUGAR UP TO 4 TIMES DAILY AS DIRECTED. 1 DEVICE PER YEAR. DX: E11.9   Dexcom G6 Sensor Misc USE TO CHECK BLOOD  SUGAR UP TO 4 TIMES DAILY AS DIRECTED. CHANGE SENSOR EVERY 30 DAYS. DX: E11.9   Dexcom G6 Transmitter Misc USE TO CHECK BLOOD SUGAR UP TO 4 TIMES DAILY AS DIRECTED. CHANGE TRANSMITTER EVERY 3 MONTHS. DX: E11.9   docusate sodium 100 MG capsule Commonly known as: COLACE Take 100 mg by mouth daily as needed for mild constipation or moderate constipation.   donepezil 5 MG tablet Commonly known as: ARICEPT Take 5 mg by mouth at bedtime.   famotidine 20 MG tablet Commonly known as: PEPCID TAKE 1 TABLET BY MOUTH  TWICE DAILY WITH MEALS What changed: when to take this   Fish Oil 1200 MG Caps Take 1,200-2,400 mg by mouth See admin instructions. Tale 1200 mg in the morning and 2400 mg in the evening   furosemide 20 MG tablet Commonly known as: LASIX Take 3 tablets (60 mg total) by mouth daily.   glucose blood test strip Test 4 times daily Dx E11.59   HumaLOG KwikPen 100 UNIT/ML KwikPen Generic drug: insulin lispro INJECT 0 TO 15 UNITS  SUBCUTANEOUSLY 3 TIMES  DAILY BEFORE MEALS ;  70-150=8 UNITS, 151-200= 9  UNITS. What changed: See the new instructions.   isosorbide mononitrate 30 MG 24 hr tablet Commonly known as: IMDUR TAKE 1 TABLET BY MOUTH IN  THE EVENING What changed: when to take this   Lantus SoloStar 100 UNIT/ML Solostar Pen Generic drug: insulin glargine INJECT SUBCUTANEOUSLY 52  UNITS AT BEDTIME What changed: See the new instructions.   metFORMIN 500 MG 24 hr tablet Commonly known as: GLUCOPHAGE-XR TAKE 2 TABLETS BY MOUTH  TWICE DAILY What changed: when to take this   nitroGLYCERIN 0.4 MG SL tablet Commonly known as: NITROSTAT DISSOLVE ONE TABLET UNDER THE TONGUE EVERY 5 MINUTES AS NEEDED FOR CHEST PAIN.  DO NOT EXCEED A TOTAL OF 3 DOSES IN 15 MINUTES What changed:   how much to take  how to take this  when to take this  reasons to take this   PARoxetine 20 MG tablet Commonly known as: PAXIL TAKE 1 TABLET BY MOUTH  DAILY What changed: when to take  this   potassium chloride SA 20 MEQ tablet Commonly known as: KLOR-CON Take 1 tablet (20 mEq total) by mouth daily.   tamsulosin 0.4 MG Caps capsule Commonly known as: FLOMAX TAKE 1 CAPSULE BY MOUTH  DAILY What changed: when to take this   Turmeric Curcumin 500 MG Caps Take 500 mg by mouth daily.   vitamin B-12 1000  MCG tablet Commonly known as: CYANOCOBALAMIN Take 2,000 mcg by mouth daily.   Vitamin D3 50 MCG (2000 UT) capsule Take 4,000 Units by mouth daily.          Outstanding Labs/Studies   TVAR work up as outpatient   Duration of Discharge Encounter   Greater than 30 minutes including physician time.  Jarrett Soho, PA 11/23/2019, 8:59 AM   Patient seen and examined. Agree with assessment and plan.  No recurrent chest pain, status post successful PCI with DES stenting of the proximal to mid LAD with nonoverlapping stents.  Medical therapy for concomitant CAD.  Right groin cath site stable without ecchymosis or hematoma plan to continue TAVR work-up as an outpatient.  CTA imaging performed with annular measurements appropriate for 26 mm Edwards Sapien 3 TAVR.  Will DC today   Troy Sine, MD, [facility] 11/23/2019 12:39 PM

## 2019-11-23 NOTE — Progress Notes (Signed)
CARDIAC REHAB PHASE I   PRE:  Rate/Rhythm: 67 SR  BP:  Supine: 149/72  Sitting:   Standing:    SaO2: 96%RA  MODE:  Ambulation: 400 ft   POST:  Rate/Rhythm: 103 ST  BP:  Supine:   Sitting: 188/70,  156/68  Standing:    SaO2: 97%RA 0850-0940 Pt walked 400 ft on RA with fast steady gait. Leans forward. Encouraged to slow down. No CP. To bed after walk. BP elevated but improved with rest. Reviewed importance of plavix with stents. Discussed NTG use, heart healthy and low carb foods and CRP 2. Referral to [place] placed. Pt and wife voiced understanding. Know that pt cannot attend until after TAVR.  PT ordered for 6 minute walk test prior to d/c.   Graylon Good, RN BSN  11/23/2019 9:33 AM

## 2019-11-23 NOTE — Progress Notes (Signed)
Inpatient Diabetes Program Recommendations  AACE/ADA: New Consensus Statement on Inpatient Glycemic Control (2015)  Target Ranges:  Prepandial:   less than 140 mg/dL      Peak postprandial:   less than 180 mg/dL (1-2 hours)      Critically ill patients:  140 - 180 mg/dL   Lab Results  Component Value Date   GLUCAP 364 (H) 11/23/2019   HGBA1C 7.6 (H) 11/14/2019    Review of Glycemic Control Results for [name], [name]" (MRN [id number]) as of 11/23/2019 09:46  Ref. Range 11/22/2019 21:15 11/23/2019 06:46  Glucose-Capillary Latest Ref Range: 70 - 99 mg/dL 264 (H) 364 (H)    Inpatient Diabetes Program Recommendations:     Lantus 45 units qhs (80% of home dose)  Will continue to follow while inpatient.  Thank you, Reche Dixon, RN, BSN Diabetes Coordinator Inpatient Diabetes Program [phone] (team pager from 8a-5p)

## 2019-11-23 NOTE — Progress Notes (Signed)
Carotid duplex has been completed.   Preliminary results in CV Proc.   Abram Sander 11/23/2019 10:21 AM

## 2019-11-23 NOTE — Plan of Care (Signed)
  Problem: Education: Goal: Knowledge of General Education information will improve Description: Including pain rating scale, medication(s)/side effects and non-pharmacologic comfort measures Outcome: Adequate for Discharge   Problem: Health Behavior/Discharge Planning: Goal: Ability to manage health-related needs will improve Outcome: Adequate for Discharge   Problem: Clinical Measurements: Goal: Ability to maintain clinical measurements within normal limits will improve Outcome: Adequate for Discharge Goal: Diagnostic test results will improve Outcome: Adequate for Discharge   Problem: Activity: Goal: Risk for activity intolerance will decrease Outcome: Adequate for Discharge   Problem: Nutrition: Goal: Adequate nutrition will be maintained Outcome: Adequate for Discharge   Problem: Elimination: Goal: Will not experience complications related to bowel motility Outcome: Adequate for Discharge Goal: Will not experience complications related to urinary retention Outcome: Adequate for Discharge   Problem: Pain Managment: Goal: General experience of comfort will improve Outcome: Adequate for Discharge   Problem: Safety: Goal: Ability to remain free from injury will improve Outcome: Adequate for Discharge   Problem: Education: Goal: Ability to demonstrate management of disease process will improve Outcome: Adequate for Discharge Goal: Ability to verbalize understanding of medication therapies will improve Outcome: Adequate for Discharge Goal: Individualized Educational Video(s) Outcome: Adequate for Discharge   Problem: Activity: Goal: Capacity to carry out activities will improve Outcome: Adequate for Discharge   Problem: Cardiac: Goal: Ability to achieve and maintain adequate cardiopulmonary perfusion will improve Outcome: Adequate for Discharge

## 2019-11-23 NOTE — Evaluation (Addendum)
Physical Therapy Evaluation and TAVR Assessment Patient Details Name: [name] MRN: [id number] DOB: 05-Oct-1940 Today's Date: 11/23/2019   History of Present Illness  79 yo male with onset of findings from cardiac cath was admitted, to work on aortic stenosis.  Has worsening SOB with exertion, fatigue and limited endurance.   PMHx:  anxiety, aortic atherosclerosis, cervical spondylosis, HA, colon polyps, MI, HLD, kidney stones, OSA, spinal stenosis, DM, cataracts, lumbar laminectomy  Clinical Impression  See below for TAVR assessment.      Follow Up Recommendations Outpatient PT;Other (comment) (can be attached to cardiac rehab)    Equipment Recommendations  None recommended by PT    Recommendations for Other Services       Precautions / Restrictions Precautions Precautions: Other (comment);Fall (monitor vitals with effort) Precaution Comments: 96% sat post gait Restrictions Weight Bearing Restrictions: No      Mobility  Bed Mobility Overal bed mobility: Modified Independent                  Transfers Overall transfer level: Modified independent Equipment used: None             General transfer comment: No LOB with mobility  Ambulation/Gait Ambulation/Gait assistance: Supervision Gait Distance (Feet): 270 Feet Assistive device: None Gait Pattern/deviations: Step-through pattern;Wide base of support;Decreased stride length Gait velocity: variable Gait velocity interpretation: <1.31 ft/sec, indicative of household ambulator General Gait Details: Pt was up to walk with no AD, tends to rush and reminded him to walk at comfortable speed, fatigued at 73min 6 seconds of 6 min walk test  Stairs            Wheelchair Mobility    Modified Rankin (Stroke Patients Only)       Balance Overall balance assessment: Needs assistance Sitting-balance support: Feet supported Sitting balance-Leahy Scale: Good     Standing balance support: No upper  extremity supported Standing balance-Leahy Scale: Fair                               Pertinent Vitals/Pain Pain Assessment: No/denies pain    Home Living Family/patient expects to be discharged to:: Private residence Living Arrangements: Spouse/significant other Available Help at Discharge: Family;Available 24 hours/day Type of Home: House Home Access: Stairs to enter Entrance Stairs-Rails: Right;Left;Can reach both Entrance Stairs-Number of Steps: 3 Home Layout: Two level;Able to live on main level with bedroom/bathroom Home Equipment: Kasandra Knudsen - single point;Walker - 2 wheels;Shower seat - built in;Toilet riser;Grab bars - toilet;Grab bars - tub/shower Additional Comments: Wife reports all equipment is there that is needed    Prior Function Level of Independence: Independent with assistive device(s)         Comments: pt is not using an AD     Hand Dominance   Dominant Hand: Right    Extremity/Trunk Assessment   Upper Extremity Assessment Upper Extremity Assessment: Overall WFL for tasks assessed    Lower Extremity Assessment Lower Extremity Assessment: Overall WFL for tasks assessed    Cervical / Trunk Assessment Cervical / Trunk Assessment: Kyphotic  Communication   Communication: No difficulties  Cognition Arousal/Alertness: Awake/alert Behavior During Therapy: WFL for tasks assessed/performed Overall Cognitive Status: Within Functional Limits for tasks assessed                                 General Comments: pt is quite aware of  the symptoms that he has been having, motivated to improve      General Comments General comments (skin integrity, edema, etc.): pt performed TAVR assessment, tired and SOB with sats 96% and pulse 82 at the end    Exercises     Assessment/Plan    PT Assessment Patient needs continued PT services  PT Problem List Decreased range of motion;Decreased activity tolerance;Decreased mobility;Cardiopulmonary  status limiting activity;Obesity       PT Treatment Interventions DME instruction;Gait training;Stair training;Functional mobility training;Therapeutic activities;Therapeutic exercise;Balance training;Neuromuscular re-education;Patient/family education    PT Goals (Current goals can be found in the Care Plan section)  Acute Rehab PT Goals Patient Stated Goal: to get home and feel better PT Goal Formulation: With patient/family Time For Goal Achievement: 12/07/19 Potential to Achieve Goals: Good    Frequency Min 3X/week   Barriers to discharge   home in accessible environment and with wife    Co-evaluation               AM-PAC PT "6 Clicks" Mobility  Outcome Measure Help needed turning from your back to your side while in a flat bed without using bedrails?: None Help needed moving from lying on your back to sitting on the side of a flat bed without using bedrails?: None Help needed moving to and from a bed to a chair (including a wheelchair)?: A Little Help needed standing up from a chair using your arms (e.g., wheelchair or bedside chair)?: A Little Help needed to walk in hospital room?: A Little Help needed climbing 3-5 steps with a railing? : A Little 6 Click Score: 20    End of Session Equipment Utilized During Treatment: Gait belt Activity Tolerance: Patient tolerated treatment well;Patient limited by fatigue;Treatment limited secondary to medical complications (Comment) Patient left: in bed;with call bell/phone within reach;with family/visitor present Nurse Communication: Mobility status PT Visit Diagnosis: Difficulty in walking, not elsewhere classified (R26.2)    Time: 8127-5170 PT Time Calculation (min) (ACUTE ONLY): 25 min   Charges:   PT Evaluation $PT Eval Moderate Complexity: 1 Mod PT Treatments $Gait Training: 8-22 mins        11/23/2019 PT TAVR Pre-Assessment  HPI: See above.  Clinical Impression Statement: Pt is a 79 y.o. male being assessed  for pre-TAVR.  Pt reports symptoms of SOB with activity.  Pt has 4+ to 5 strength, hip flexion contractures restricting ROM, and fair standing balance.  Pt ambulated 270 ft during the 6 minute walk test requiring one rest breaks with max HR of 82, lowest O2 sat 96%.  5 meter walk test produced an average gait speed of 1.377m/sec which indicates low fall risk.  RPE was 7 and dyspnea was 11 during mobility.  Pt was limited by SOB.  Pt's frailty rating was 3 which is considered low frailty.  Pt would  benefit from continued PT in the acute care setting due to the above listed deficits in balance, strength, ROM, endurance and activity tolerance.    General UE/LE Strength and ROM:  Strength (0-5/5) ROM (limited/full)  R UE    L UE    R LE    L LE      6 Minute Walk Test:  3.06 minutes walk time  Total Distance Walked:270 ft.    Did the pt need a rest break? yes  If yes, why? Pain:; Fatigue:no ; Dyspnea/O2 saturations:yes  Comments: Sats were 96% end of walk with SOB   Pre-Test Post-Test  BP  111/83  NA in time  HR  73 82  O2 saturations (indicated RA or L/min Ray)  97 96  Modified Borg Dyspnea Scale (0 none-10 maximal)  7  11  RPE (6 very light-10 very hard)   6   7  Comments: Pt is SOB but not noting higher scores on fatigue.    5 Meter Walk Test:  Trial 1 6.5 seconds  Trial 2 7.02 seconds  Trial 3 6.3 seconds  3 Trial Average/Gait Speed 6.61 seconds/ 1.362m/sec (<1.8 ft/sec indicates high fall risk)  Comments:  Good gait speeds.  Clinical Frailty Scale (1 very fit - 9 terminally ill): 3 (</= 5/12 is considered frail)    Ramond Dial 11/23/2019, 12:47 PM   Mee Hives, PT MS Acute Rehab Dept. Number: [place]

## 2019-11-23 NOTE — Progress Notes (Signed)
Discharge instructions including bu tnot limited to f/u appmts, hf, meds, daily wts, & site care given to pt &  His wife. All questions answered & education verified via teachback method. All belongings gathered & sent with pt. Hoover Brunette, RN

## 2019-11-23 NOTE — Discharge Instructions (Signed)
Information about your medication: Plavix (anti-platelet agent)  Generic Name (Brand): clopidogrel (Plavix), once daily medication  PURPOSE: You are taking this medication along with aspirin to lower your chance of having a heart attack, stroke, or blood clots in your heart stent. These can be fatal. Plavix and aspirin help prevent platelets from sticking together and forming a clot that can block an artery or your stent.   Common SIDE EFFECTS you may experience include: bruising or bleeding more easily, shortness of breath  Do not stop taking PLAVIX without talking to the doctor who prescribes it for you. People who are treated with a stent and stop taking Plavix too soon, have a higher risk of getting a blood clot in the stent, having a heart attack, or dying. If you stop Plavix because of bleeding, or for other reasons, your risk of a heart attack or stroke may increase.   Avoid taking NSAID agents or anti-inflammatory medications such as ibuprofen, naproxen given increased bleed risk with plavix - can use acetaminophen (Tylenol) if needed for pain.  Avoid taking over the counter stomach medications omeprazole (Prilosec) or esomeprazole (Nexium) since these do interact and make plavix less effective - ask your pharmacist or doctor for alterative agents if needed for heartburn or GERD.   Tell all of your doctors and dentists that you are taking Plavix. They should talk to the doctor who prescribed Plavix for you before you have any surgery or invasive procedure.   Contact your health care provider if you experience: severe or uncontrollable bleeding, pink/red/brown urine, vomiting blood or vomit that looks like "coffee grounds", red or black stools (looks like tar), coughing up blood or blood clots ----------------------------------------------------------------------------------------------------------------------    **CAN START BACK ON METFORMIN ON 11/14 IN AM (48 HR AFTER CARDIAC CATH)**

## 2019-11-27 ENCOUNTER — Other Ambulatory Visit: Payer: Self-pay

## 2019-11-27 DIAGNOSIS — I350 Nonrheumatic aortic (valve) stenosis: Secondary | ICD-10-CM

## 2019-11-29 NOTE — Progress Notes (Signed)
Your procedure is scheduled on Tuesday, December 04, 2019.  Report to [facility] Main Entrance "A" at 7:15 A.M., and check in at the Admitting office.  Call this number if you have problems the morning of surgery:  [phone]  Call [phone] if you have any questions prior to your surgery date Monday-Friday 8am-4pm    Remember:  Do not eat or drink after midnight the night before your surgery   Stop taking Metformin on 11/21. You will take your last dose on 11/20 (Saturday).  Continue taking all other medications without change through the day before surgery. On the morning of surgery do not take any medications.  As of today, STOP taking any Aleve, Naproxen, Ibuprofen, Motrin, Advil, Goody's, BC's, all herbal medications, fish oil, and all vitamins.   WHAT DO I DO ABOUT MY DIABETES MEDICATION?   Marland Kitchen Do not take oral diabetes medicines (pills) the morning of surgery.  . THE NIGHT BEFORE SURGERY, take __28_____ units of __LANTUS SOLOSTAR _________insulin.      . The day of surgery, do not take other diabetes injectables, including Byetta (exenatide), Bydureon (exenatide ER), Victoza (liraglutide), or Trulicity (dulaglutide).  . If your CBG is greater than 200 mg/dL, you may take  of your [facility]) dose of insulin.   HOW TO MANAGE YOUR DIABETES BEFORE AND AFTER SURGERY  Why is it important to control my blood sugar before and after surgery? . Improving blood sugar levels before and after surgery helps healing and can limit problems. . A way of improving blood sugar control is eating a healthy diet by: o  Eating less sugar and carbohydrates o  Increasing activity/exercise o  Talking with your doctor about reaching your blood sugar goals . High blood sugars (greater than 180 mg/dL) can raise your risk of infections and slow your recovery, so you will need to focus on controlling your diabetes during the weeks before surgery. . Make sure that the doctor who takes care  of your diabetes knows about your planned surgery including the date and location.  How do I manage my blood sugar before surgery? . Check your blood sugar at least 4 times a day, starting 2 days before surgery, to make sure that the level is not too high or low. . Check your blood sugar the morning of your surgery when you wake up and every 2 hours until you get to the Short Stay unit. o If your blood sugar is less than 70 mg/dL, you will need to treat for low blood sugar: - Do not take insulin. - Treat a low blood sugar (less than 70 mg/dL) with  cup of clear juice (cranberry or apple), 4 glucose tablets, OR glucose gel. - Recheck blood sugar in 15 minutes after treatment (to make sure it is greater than 70 mg/dL). If your blood sugar is not greater than 70 mg/dL on recheck, call [phone] for further instructions. . Report your blood sugar to the short stay nurse when you get to Short Stay.  . If you are admitted to the hospital after surgery: o Your blood sugar will be checked by the staff and you will probably be given insulin after surgery (instead of oral diabetes medicines) to make sure you have good blood sugar levels. o The goal for blood sugar control after surgery is 80-180 mg/dL.                     Do not wear jewelry.  Do not wear lotions, powders, colognes, or deodorant.            Do not shave 48 hours prior to surgery.  Men may shave face and neck.            Do not bring valuables to the hospital.            [facility] is not responsible for any belongings or valuables.  Do NOT Smoke (Tobacco/Vaping) or drink Alcohol 24 hours prior to your procedure If you use a CPAP at night, you may bring all equipment for your overnight stay.   Contacts, glasses, dentures or bridgework may not be worn into surgery.      For patients admitted to the hospital, discharge time will be determined by your treatment team.   Patients discharged the day of surgery will not be  allowed to drive home, and someone needs to stay with them for 24 hours.    Special instructions:   [place]- Preparing For Surgery  Before surgery, you can play an important role. Because skin is not sterile, your skin needs to be as free of germs as possible. You can reduce the number of germs on your skin by washing with CHG (chlorahexidine gluconate) Soap before surgery.  CHG is an antiseptic cleaner which kills germs and bonds with the skin to continue killing germs even after washing.    Oral Hygiene is also important to reduce your risk of infection.  Remember - BRUSH YOUR TEETH THE MORNING OF SURGERY WITH YOUR REGULAR TOOTHPASTE  Please do not use if you have an allergy to CHG or antibacterial soaps. If your skin becomes reddened/irritated stop using the CHG.  Do not shave (including legs and underarms) for at least 48 hours prior to first CHG shower. It is OK to shave your face.  Please follow these instructions carefully.   1. Shower the NIGHT BEFORE SURGERY and the MORNING OF SURGERY with CHG Soap.   2. If you chose to wash your hair, wash your hair first as usual with your normal shampoo.  3. After you shampoo, rinse your hair and body thoroughly to remove the shampoo.  4. Use CHG as you would any other liquid soap. You can apply CHG directly to the skin and wash gently with a scrungie or a clean washcloth.   5. Apply the CHG Soap to your body ONLY FROM THE NECK DOWN.  Do not use on open wounds or open sores. Avoid contact with your eyes, ears, mouth and genitals (private parts). Wash Face and genitals (private parts)  with your normal soap.   6. Wash thoroughly, paying special attention to the area where your surgery will be performed.  7. Thoroughly rinse your body with warm water from the neck down.  8. DO NOT shower/wash with your normal soap after using and rinsing off the CHG Soap.  9. Pat yourself dry with a CLEAN TOWEL.  10. Wear CLEAN PAJAMAS to bed the night  before surgery  11. Place CLEAN SHEETS on your bed the night of your first shower and DO NOT SLEEP WITH PETS.   Day of Surgery: Wear Clean/Comfortable clothing the morning of surgery Do not apply any deodorants/lotions.   Remember to brush your teeth WITH YOUR REGULAR TOOTHPASTE.   Please read over the following fact sheets that you were given.

## 2019-11-30 ENCOUNTER — Other Ambulatory Visit (HOSPITAL_COMMUNITY)
Admission: RE | Admit: 2019-11-30 | Discharge: 2019-11-30 | Disposition: A | Discharge status: Home / Self Care | Payer: Medicare Other | Source: Ambulatory Visit | Attending: Cardiovascular Disease | Admitting: Cardiovascular Disease

## 2019-11-30 ENCOUNTER — Other Ambulatory Visit: Payer: Self-pay | Admitting: Family Medicine

## 2019-11-30 ENCOUNTER — Encounter (HOSPITAL_COMMUNITY)
Admission: RE | Admit: 2019-11-30 | Discharge: 2019-11-30 | Disposition: A | Discharge status: Home / Self Care | Payer: Medicare Other | Source: Ambulatory Visit | Attending: Cardiovascular Disease | Admitting: Cardiovascular Disease

## 2019-11-30 ENCOUNTER — Other Ambulatory Visit: Payer: Self-pay

## 2019-11-30 DIAGNOSIS — F419 Anxiety disorder, unspecified: Secondary | ICD-10-CM | POA: Diagnosis not present

## 2019-11-30 DIAGNOSIS — Z01818 Encounter for other preprocedural examination: Secondary | ICD-10-CM | POA: Diagnosis not present

## 2019-11-30 DIAGNOSIS — I10 Essential (primary) hypertension: Secondary | ICD-10-CM | POA: Insufficient documentation

## 2019-11-30 DIAGNOSIS — Z79899 Other long term (current) drug therapy: Secondary | ICD-10-CM | POA: Diagnosis not present

## 2019-11-30 DIAGNOSIS — Z7901 Long term (current) use of anticoagulants: Secondary | ICD-10-CM | POA: Diagnosis not present

## 2019-11-30 DIAGNOSIS — K219 Gastro-esophageal reflux disease without esophagitis: Secondary | ICD-10-CM | POA: Diagnosis not present

## 2019-11-30 DIAGNOSIS — I2510 Atherosclerotic heart disease of native coronary artery without angina pectoris: Secondary | ICD-10-CM | POA: Insufficient documentation

## 2019-11-30 DIAGNOSIS — Z791 Long term (current) use of non-steroidal anti-inflammatories (NSAID): Secondary | ICD-10-CM | POA: Insufficient documentation

## 2019-11-30 DIAGNOSIS — E118 Type 2 diabetes mellitus with unspecified complications: Secondary | ICD-10-CM | POA: Diagnosis not present

## 2019-11-30 DIAGNOSIS — Z7982 Long term (current) use of aspirin: Secondary | ICD-10-CM | POA: Insufficient documentation

## 2019-11-30 DIAGNOSIS — I350 Nonrheumatic aortic (valve) stenosis: Secondary | ICD-10-CM | POA: Insufficient documentation

## 2019-11-30 DIAGNOSIS — Z01812 Encounter for preprocedural laboratory examination: Secondary | ICD-10-CM | POA: Insufficient documentation

## 2019-11-30 DIAGNOSIS — Z87891 Personal history of nicotine dependence: Secondary | ICD-10-CM | POA: Diagnosis not present

## 2019-11-30 DIAGNOSIS — G4733 Obstructive sleep apnea (adult) (pediatric): Secondary | ICD-10-CM | POA: Diagnosis not present

## 2019-11-30 DIAGNOSIS — E785 Hyperlipidemia, unspecified: Secondary | ICD-10-CM | POA: Insufficient documentation

## 2019-11-30 DIAGNOSIS — Z7984 Long term (current) use of oral hypoglycemic drugs: Secondary | ICD-10-CM | POA: Insufficient documentation

## 2019-11-30 DIAGNOSIS — Z20822 Contact with and (suspected) exposure to covid-19: Secondary | ICD-10-CM | POA: Insufficient documentation

## 2019-11-30 LAB — CBC
HCT: 42.7 % (ref 39.0–52.0)
Hemoglobin: 13.4 g/dL (ref 13.0–17.0)
MCH: 27.6 pg (ref 26.0–34.0)
MCHC: 31.4 g/dL (ref 30.0–36.0)
MCV: 88 fL (ref 80.0–100.0)
Platelets: 318 10*3/uL (ref 150–400)
RBC: 4.85 MIL/uL (ref 4.22–5.81)
RDW: 15 % (ref 11.5–15.5)
WBC: 8 10*3/uL (ref 4.0–10.5)
nRBC: 0 % (ref 0.0–0.2)

## 2019-11-30 LAB — BLOOD GAS, ARTERIAL
Acid-Base Excess: 1.6 mmol/L (ref 0.0–2.0)
Bicarbonate: 25.7 mmol/L (ref 20.0–28.0)
Drawn by: 602861
FIO2: 21
O2 Saturation: 94.8 %
Patient temperature: 37
pCO2 arterial: 40.2 mmHg (ref 32.0–48.0)
pH, Arterial: 7.422 (ref 7.350–7.450)
pO2, Arterial: 75.9 mmHg — ABNORMAL LOW (ref 83.0–108.0)

## 2019-11-30 LAB — COMPREHENSIVE METABOLIC PANEL
ALT: 26 U/L (ref 0–44)
AST: 26 U/L (ref 15–41)
Albumin: 3.5 g/dL (ref 3.5–5.0)
Alkaline Phosphatase: 74 U/L (ref 38–126)
Anion gap: 15 (ref 5–15)
BUN: 21 mg/dL (ref 8–23)
CO2: 22 mmol/L (ref 22–32)
Calcium: 9.5 mg/dL (ref 8.9–10.3)
Chloride: 101 mmol/L (ref 98–111)
Creatinine, Ser: 1.18 mg/dL (ref 0.61–1.24)
GFR, Estimated: >60 mL/min (ref >60)
Glucose, Bld: 151 mg/dL — ABNORMAL HIGH (ref 70–99)
Potassium: 4.1 mmol/L (ref 3.5–5.1)
Sodium: 138 mmol/L (ref 135–145)
Total Bilirubin: 0.7 mg/dL (ref 0.3–1.2)
Total Protein: 7.1 g/dL (ref 6.5–8.1)

## 2019-11-30 LAB — TYPE AND SCREEN
ABO/RH(D): O POS
Antibody Screen: NEGATIVE

## 2019-11-30 LAB — URINALYSIS, ROUTINE W REFLEX MICROSCOPIC
Bilirubin Urine: NEGATIVE
Glucose, UA: NEGATIVE mg/dL
Hgb urine dipstick: NEGATIVE
Ketones, ur: NEGATIVE mg/dL
Leukocytes,Ua: NEGATIVE
Nitrite: NEGATIVE
Protein, ur: NEGATIVE mg/dL
Specific Gravity, Urine: 1.008 (ref 1.005–1.030)
pH: 5 (ref 5.0–8.0)

## 2019-11-30 LAB — SURGICAL PCR SCREEN
MRSA, PCR: NEGATIVE
Staphylococcus aureus: NEGATIVE

## 2019-11-30 LAB — BRAIN NATRIURETIC PEPTIDE: B Natriuretic Peptide: 23.4 pg/mL (ref 0.0–100.0)

## 2019-11-30 LAB — APTT: aPTT: 28 seconds (ref 24–36)

## 2019-11-30 LAB — HEMOGLOBIN A1C
Hgb A1c MFr Bld: 8.2 % — ABNORMAL HIGH (ref 4.8–5.6)
Mean Plasma Glucose: 188.64 mg/dL

## 2019-11-30 LAB — PROTIME-INR
INR: 1 (ref 0.8–1.2)
Prothrombin Time: 12.8 seconds (ref 11.4–15.2)

## 2019-11-30 LAB — SARS CORONAVIRUS 2 (TAT 6-24 HRS): SARS Coronavirus 2: NEGATIVE

## 2019-11-30 LAB — GLUCOSE, CAPILLARY: Glucose-Capillary: 165 mg/dL — ABNORMAL HIGH (ref 70–99)

## 2019-11-30 NOTE — Progress Notes (Signed)
PCP - Dr. Vonna Kotyk Dettinger Cardiologist - Dr. Rozann Lesches  PPM/ICD - Denies  Chest x-ray - 11/30/19 EKG - 11/23/19 Stress Test - Denies ECHO - 10/31/19 Cardiac Cath - 11/22/19  Sleep Study - Yes CPAP - Yes  Fasting Blood Sugar - 170's Checks Blood Sugar __4___ times a day  Blood Thinner Instructions: N/A Aspirin Instructions: Continue taking ASA  ERAS Protcol - No  COVID TEST- 11/30/19   Coronavirus Screening  Have you experienced the following symptoms:  Cough yes/no: No Fever (>100.65F)  yes/no: No Runny nose yes/no: No Sore throat yes/no: No Difficulty breathing/shortness of breath  yes/no: No  Have you or a family member traveled in the last 14 days and where? yes/no: No   If the patient indicates "YES" to the above questions, their PAT will be rescheduled to limit the exposure to others and, the surgeon will be notified. THE PATIENT WILL NEED TO BE ASYMPTOMATIC FOR 14 DAYS.   If the patient is not experiencing any of these symptoms, the PAT nurse will instruct them to NOT bring anyone with them to their appointment since they may have these symptoms or traveled as well.   Please remind your patients and families that hospital visitation restrictions are in effect and the importance of the restrictions.     Anesthesia review: yes, cardiac hx  Patient denies shortness of breath, fever, cough and chest pain at PAT appointment   All instructions explained to the patient, with a verbal understanding of the material. Patient agrees to go over the instructions while at home for a better understanding. Patient also instructed to self quarantine after being tested for COVID-19. The opportunity to ask questions was provided.

## 2019-12-03 MED ORDER — POTASSIUM CHLORIDE 2 MEQ/ML IV SOLN
80.0000 meq | INTRAVENOUS | Status: DC
  Filled 2019-12-03: qty 40

## 2019-12-03 MED ORDER — SODIUM CHLORIDE 0.9 % IV SOLN
1.5000 g | INTRAVENOUS | Status: AC
  Administered 2019-12-04: 1.5 g via INTRAVENOUS
  Filled 2019-12-03: qty 1.5

## 2019-12-03 MED ORDER — MAGNESIUM SULFATE 50 % IJ SOLN
40.0000 meq | INTRAMUSCULAR | Status: DC
  Filled 2019-12-03: qty 9.85

## 2019-12-03 MED ORDER — VANCOMYCIN HCL 1500 MG/300ML IV SOLN
1500.0000 mg | INTRAVENOUS | Status: AC
  Administered 2019-12-04: 1500 mg via INTRAVENOUS
  Filled 2019-12-03 (×2): qty 300

## 2019-12-03 MED ORDER — DEXMEDETOMIDINE HCL IN NACL 400 MCG/100ML IV SOLN
0.1000 ug/kg/h | INTRAVENOUS | Status: AC
  Administered 2019-12-04: 1 ug/kg/h via INTRAVENOUS
  Filled 2019-12-03 (×2): qty 100

## 2019-12-03 MED ORDER — NOREPINEPHRINE 4 MG/250ML-% IV SOLN
0.0000 ug/min | INTRAVENOUS | Status: DC
  Filled 2019-12-03: qty 250

## 2019-12-03 MED ORDER — SODIUM CHLORIDE 0.9 % IV SOLN
INTRAVENOUS | Status: DC
  Filled 2019-12-03: qty 30

## 2019-12-03 NOTE — Progress Notes (Signed)
Anesthesia Chart Review:  Case: [id number] Date/Time: 12/04/19 0900   Procedures:      TRANSCATHETER AORTIC VALVE REPLACEMENT, TRANSFEMORAL (N/A )     TRANSESOPHAGEAL ECHOCARDIOGRAM (TEE) (N/A )   Anesthesia type: General   Pre-op diagnosis: Severe Aortic Stenosis   Location: [facility] CATH LAB 6 / [place] INVASIVE CV LAB   Providers: Burnell Blanks, MD    CT Surgeon: Gilford Raid, MD   DISCUSSION: Patient is a 79 year old male scheduled for the above procedure.   History includes former smoker (20 pack years, quit 08/11/88), severe AS, CAD (NSTEMI, occluded RCA s/p BMS RCA 12/17/04; DES x2 mid LAD 11/22/19), HTN, HLD, DM2, OSA (CPAP), GERD, anxiety, viral meningitis (versus encephalitis 12/2017), back surgery (L4-S1 decompression/foraminotomies/laminotomies 02/24/17), TKA (left 12/11/03; right 07/31/04). S/p left THA 07/05/18. BMI is consistent with obesity.   He is on ASA & Plavix. Per cardiology instructions, hold metformin after 12/01/19 dose, continue all other medications through the day before surgery, and "On the morning of surgery do not take any medications."  11/30/19 presurgical COVID-19 test was negative. Anesthesia team to evaluate on the day of surgery.     VS: BP (!) 143/68    Pulse 75    Temp 36.5 C (Oral)    Resp 19    Ht 5\' 6"  (1.676 m)    Wt 107.3 kg    SpO2 97%    BMI 38.19 kg/m    PROVIDERS: Dettinger, Fransisca Kaufmann, MD is PCP  Rozann Lesches, MD is cardiologist Star Age, MD is neurologist   LABS: Labs reviewed: Acceptable for surgery. (all labs ordered are listed, but only abnormal results are displayed)  Labs Reviewed  GLUCOSE, CAPILLARY - Abnormal; Notable for the following components:      Result Value   Glucose-Capillary 165 (*)    All other components within normal limits  BLOOD GAS, ARTERIAL - Abnormal; Notable for the following components:   pO2, Arterial 75.9 (*)    Allens test (pass/fail) BRACHIAL ARTERY (*)    All other components within normal limits   COMPREHENSIVE METABOLIC PANEL - Abnormal; Notable for the following components:   Glucose, Bld 151 (*)    All other components within normal limits  HEMOGLOBIN A1C - Abnormal; Notable for the following components:   Hgb A1c MFr Bld 8.2 (*)    All other components within normal limits  URINALYSIS, ROUTINE W REFLEX MICROSCOPIC - Abnormal; Notable for the following components:   Color, Urine STRAW (*)    All other components within normal limits  SURGICAL PCR SCREEN  APTT  BRAIN NATRIURETIC PEPTIDE  CBC  PROTIME-INR  TYPE AND SCREEN    Spirometry 11/15/19:   Ref. Range 11/15/2019 14:30  FVC-Pre Latest Units: L 1.95  FVC-%Pred-Pre Latest Units: % 57  FEV1-Pre Latest Units: L 1.47  FEV1-%Pred-Pre Latest Units: % 60  Pre FEV1/FVC ratio Latest Units: % 75  FEV1FVC-%Pred-Pre Latest Units: % 104  FEF 25-75 Pre Latest Units: L/sec 1.14  FEF2575-%Pred-Pre Latest Units: % 68  FEV6-Pre Latest Units: L 1.94  FEV6-%Pred-Pre Latest Units: % 61  Pre FEV6/FVC Ratio Latest Units: % 100  FEV6FVC-%Pred-Pre Latest Units: % 107     IMAGES: CXR 11/30/19: IMPRESSION: No active cardiopulmonary disease.   EKG: 11/23/19: Normal sinus rhythm Nonspecific T wave abnormality Abnormal ekg Confirmed by Ida Rogue [id number]) on 11/23/2019 9:37:14 PM   CV: US Carotid 11/23/19: Summary:  Right Carotid: Velocities in the right ICA are consistent with a 1-39%  stenosis.  Left Carotid: Velocities in the left ICA are consistent with a 1-39%  stenosis.  Vertebrals: Bilateral vertebral arteries demonstrate antegrade flow.    Cardiac cath 11/14/19:  Prox RCA-1 lesion is 70% stenosed.  Prox RCA-2 lesion is 95% stenosed.  LPAV lesion is 90% stenosed.  Lat 3rd Mrg lesion is 80% stenosed.  Prox Cx to Mid Cx lesion is 30% stenosed.  Prox LAD to Mid LAD lesion is 99% stenosed.  Mid LAD lesion is 99% stenosed.  2nd Diag lesion is 90% stenosed. 1. Severe proximal LAD stenosis, calcified.  Severe mid LAD stenosis at the takeoff of the moderate caliber diagonal branch, calcified.  2. Severe distal AV groove stenosis in the left dominant Circumflex. Severe OM 2 stenosis.  3. Moderate caliber non-dominant RCA with patent proximal stent with moderately severe restenosis. Severe mid RCA stenosis.  4. Moderately severe to severe aortic stenosis (mean gradient 26.6 mmHg, peak to peak gradient 31 mmHg, AVA 1.06 cm2).  - Recommendations: He has moderately severe to severe AS and three vessel CAD...will ask our CT surgery team to see him to discuss CABG/surgical AVR vs staged PCI and TAVR...   PCI 11/22/19: Successful PTCA/DES x 2 (non-overlapping) in the mid LAD   CT Coronary 11/15/19: IMPRESSION: 1. Tricuspid aortic valve with severe aortic stenosis (calcium score = 2012). 2. Bulky calcifications noted in the [place]. 3. Annular measurements appropriate for 26 mm Edwards Sapien 3 TAVR. 4. No significant annular or subannular calcifications. 5. Sufficient coronary to annulus distance. 6. Optimal Fluoroscopic Angle for Delivery: RAO 3 CAU 6 7. Dilated pulmonary artery suggestive of pulmonary hypertension. 8. Severe 3-vessel coronary calcifications.   Echo 10/31/19: IMPRESSIONS  1. Left ventricular ejection fraction, by estimation, is 60 to 65%. The  left ventricle has normal function. The left ventricle has no regional  wall motion abnormalities. There is mild left ventricular hypertrophy.  Left ventricular diastolic parameters  are indeterminate.  2. Right ventricular systolic function is normal. The right ventricular  size is normal. Tricuspid regurgitation signal is inadequate for assessing  PA pressure.  3. The mitral valve is grossly normal with mild annular calcification.  Trivial mitral valve regurgitation.  4. The aortic valve is tricuspid. There is moderate calcification of the  aortic valve. Aortic valve regurgitation is not visualized. Moderate to  severe aortic  valve stenosis. Aortic valve mean gradient measures 23.2  mmHg. Aortic valve Vmax measures 3.21  m/s. Dimentionless index 0.28.  5. Aortic dilatation noted. There is borderline dilatation of the  ascending aorta, measuring 40 mm.  6. Unable to estimate CVP.    Past Medical History:  Diagnosis Date   Anxiety    Aortic atherosclerosis ([place]) 12/19/2017   Arthritis    Bursitis of hip    Cervical spondylosis    Chronic headaches    Coronary atherosclerosis of native coronary artery    BMS nondominant RCA 12/2004   Essential hypertension    GERD (gastroesophageal reflux disease)    History of adenomatous polyp of colon    History of kidney stones    History of MI (myocardial infarction) 12/2004   History of viral meningitis 02/28/2018   December 2019   Hyperlipidemia    Internal hemorrhoids    Low back pain    Nocturia more than twice per night    OSA (obstructive sleep apnea)    Spinal stenosis    Type 2 diabetes mellitus treated with insulin ([place])    Wears dentures    Upper  Past Surgical History:  Procedure Laterality Date   CARDIAC CATHETERIZATION  06-14-2004  dr Lia Foyer   total occlusion mD1, RI 30-40%, mRCA nondominant hazy 80% and scattered 30-40%,  ef 71% (medical mangement)   CARDIOVASCULAR STRESS TEST  09-17-2015   dr Domenic Polite   Low risk nuclear study w/ reversible mild small anteroapical defect,  normal LV function and wall motion, nuclear stress ef 61%   CATARACT EXTRACTION W/ INTRAOCULAR LENS IMPLANT Right 07/2014   COLONOSCOPY  09/10/2008   normal   CORONARY ANGIOPLASTY WITH STENT PLACEMENT  12-17-2004   dr benismhon/ dr Olevia Perches   nonobstructive cad LAD and LCFx,  BMS x1 to total occlusion RCA    CORONARY ATHERECTOMY  11/22/2019   CORONARY ATHERECTOMY N/A 11/22/2019   Procedure: CORONARY ATHERECTOMY;  Surgeon: Burnell Blanks, MD;  Location: [place] CV LAB;  Service: Cardiovascular;  Laterality: N/A;   CYSTOSCOPY W/  URETEROSCOPY W/ LITHOTRIPSY  08/2010   ERCP  03/14/2008   EYE SURGERY Bilateral    cataracts   LAPAROSCOPIC CHOLECYSTECTOMY  05/2012   LEFT HEART CATHETERIZATION WITH CORONARY ANGIOGRAM N/A 05/14/2011   Procedure: LEFT HEART CATHETERIZATION WITH CORONARY ANGIOGRAM;  Surgeon: Sherren Mocha, MD;  Location: [facility] CATH LAB;  Service: Cardiovascular;  Laterality: N/A;    non-obstructive LM, LAD, LCFx, patent RCA stent   LUMBAR LAMINECTOMY/DECOMPRESSION MICRODISCECTOMY N/A 02/24/2017   Procedure: Microlumbar decompression L4-5, L5-S1;  Surgeon: Susa Day, MD;  Location: [facility] ORS;  Service: Orthopedics;  Laterality: N/A;  120 mins   RIGHT/LEFT HEART CATH AND CORONARY ANGIOGRAPHY N/A 11/14/2019   Procedure: RIGHT/LEFT HEART CATH AND CORONARY ANGIOGRAPHY;  Surgeon: Burnell Blanks, MD;  Location: [place] CV LAB;  Service: Cardiovascular;  Laterality: N/A;   ROTATOR CUFF REPAIR Right 03/2002   ROTATOR CUFF REPAIR Left 12/11/2015   TOTAL HIP ARTHROPLASTY Right 12/20/2008   TOTAL HIP ARTHROPLASTY Left 07/05/2018   Procedure: TOTAL HIP ARTHROPLASTY ANTERIOR APPROACH;  Surgeon: Gaynelle Arabian, MD;  Location: [facility] ORS;  Service: Orthopedics;  Laterality: Left;  182min   TOTAL KNEE ARTHROPLASTY Bilateral left 12-11-2003/  right 07-31-2004   dr Wynelle Link Bangs ENDOSCOPY  01/08/2008   bx, inlet patch, duodenitis   YAG LASER APPLICATION Right 1/60/1093   Procedure: YAG LASER APPLICATION;  Surgeon: Williams Che, MD;  Location: [facility] ORS;  Service: Ophthalmology;  Laterality: Right;    MEDICATIONS:  acetaminophen (TYLENOL) 650 MG CR tablet   amLODipine (NORVASC) 10 MG tablet   aspirin EC 81 MG tablet   atorvastatin (LIPITOR) 40 MG tablet   butalbital-acetaminophen-caffeine (FIORICET) 50-325-40 MG tablet   cetirizine (ZYRTEC) 10 MG tablet   Cholecalciferol (VITAMIN D3) 50 MCG (2000 UT) capsule   clopidogrel (PLAVIX) 75 MG tablet   Continuous Blood Gluc  Receiver (DEXCOM G6 RECEIVER) DEVI   Continuous Blood Gluc Sensor (DEXCOM G6 SENSOR) MISC   Continuous Blood Gluc Transmit (DEXCOM G6 TRANSMITTER) MISC   docusate sodium (COLACE) 100 MG capsule   donepezil (ARICEPT) 5 MG tablet   famotidine (PEPCID) 20 MG tablet   furosemide (LASIX) 20 MG tablet   glucose blood test strip   HUMALOG KWIKPEN 100 UNIT/ML KwikPen   isosorbide mononitrate (IMDUR) 30 MG 24 hr tablet   LANTUS SOLOSTAR 100 UNIT/ML Solostar Pen   metFORMIN (GLUCOPHAGE-XR) 500 MG 24 hr tablet   nitroGLYCERIN (NITROSTAT) 0.4 MG SL tablet   Omega-3 Fatty Acids (FISH OIL) 1200 MG CAPS   PARoxetine (PAXIL) 20 MG tablet   potassium chloride SA (  KLOR-CON) 20 MEQ tablet   tamsulosin (FLOMAX) 0.4 MG CAPS capsule   Turmeric Curcumin 500 MG CAPS   vitamin B-12 (CYANOCOBALAMIN) 1000 MCG tablet   No current facility-administered medications for this encounter.    [START ON 12/04/2019] cefUROXime (ZINACEF) 1.5 g in sodium chloride 0.9 % 100 mL IVPB   [START ON 12/04/2019] dexmedetomidine (PRECEDEX) 400 MCG/100ML (4 mcg/mL) infusion   [START ON 12/04/2019] heparin 30,000 units/NS 1000 mL solution for CELLSAVER   [START ON 12/04/2019] magnesium sulfate (IV Push/IM) injection 40 mEq   [START ON 12/04/2019] norepinephrine (LEVOPHED) 4mg  in 268mL premix infusion   [START ON 12/04/2019] potassium chloride injection 80 mEq   [START ON 12/04/2019] vancomycin (VANCOREADY) IVPB 1500 mg/300 mL    Myra Gianotti, PA-C Surgical Short Stay/Anesthesiology [facility] Phone [phone] [facility] Phone [phone] 12/03/2019 11:48 AM

## 2019-12-03 NOTE — Anesthesia Preprocedure Evaluation (Addendum)
Anesthesia Evaluation  Patient identified by MRN, date of birth, ID band Patient awake    Reviewed: Allergy & Precautions, NPO status , Patient's Chart, lab work & pertinent test results  Airway Mallampati: I  TM Distance: >3 FB Neck ROM: Full    Dental   Pulmonary former smoker,    Pulmonary exam normal        Cardiovascular hypertension, Pt. on medications + CAD and + Cardiac Stents  Normal cardiovascular exam+ Valvular Problems/Murmurs AS      Neuro/Psych Anxiety    GI/Hepatic GERD  Medicated and Controlled,  Endo/Other  diabetes, Type 2  Renal/GU      Musculoskeletal   Abdominal   Peds  Hematology   Anesthesia Other Findings   Reproductive/Obstetrics                            Anesthesia Physical Anesthesia Plan  ASA: III  Anesthesia Plan: MAC   Post-op Pain Management:    Induction: Intravenous  PONV Risk Score and Plan: 1 and Treatment may vary due to age or medical condition  Airway Management Planned: Nasal Cannula  Additional Equipment:   Intra-op Plan:   Post-operative Plan:   Informed Consent: I have reviewed the patients History and Physical, chart, labs and discussed the procedure including the risks, benefits and alternatives for the proposed anesthesia with the patient or authorized representative who has indicated his/her understanding and acceptance.       Plan Discussed with: CRNA and Surgeon  Anesthesia Plan Comments: (PAT note written 12/03/2019 by Myra Gianotti, PA-C. )       Anesthesia Quick Evaluation

## 2019-12-03 NOTE — H&P (Signed)
[address]       [place] [address]             [phone]      Cardiothoracic Surgery Admission History and Physical   Patient ID: [name]  MRN: [id number]; DOB: [birth year]  Admit date: 11/14/2019  Date of Consult: 11/16/2019  Primary Care Provider: Dettinger, [name] Kaufmann, MD  [facility] Cardiologist: [name] Lesches, MD  [facility] Electrophysiologist: None    Patient Profile:  [name] is a 79 y.o. male with a history of CAD s/p BMS to RCA (2006), HTN, GERD, HLD, morbid obesity, sleep apnea, IDDM and moderate to severe aortic stenosis undergoing TAVR work up who presented to [facility] on 11/14/19 for planned diagnostic cath. Cath showed elevated filling pressures and multivessel CAD. He was admitted for IV diuresis and CT surgery consult to discuss CABG/surgical AVR vs staged PCI and TAVR.   History of Present Illness:  [name] lives in [place] with his wife. He worked for [employer] for many years and retired around 37 years ago. 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 The patient has one grown son and two grandchildren. The patient is very sedentary and the most activity he gets is walking out to his car and riding his lawnmower. He does not require the aid of a walker or cane. He still drives. He is mostly limited by bilateral hip pain but also over the past year from progressive dyspnea on exertion, as well. The patient has full top dentures and dental implants on the bottom. However, he has 4 of his native teeth remaining and has not seen a dentist in many years. He is fully vaccinated for Covid with [employer].  He is known to have CAD with last cardiac cath in 2006 at which time a bare metal stent was placed in the RCA. He has been followed by Dr. Domenic [name] for moderate aortic stenosis. His most recent echocardiogram on 10/31/2019 showed EF 60 to 65%, mild LVH, moderate to severe atrial stenosis with a mean  gradient of 23.2 mmHg, peak gradient 41.2 mmHg, AVA 0.98 cm, DVI 0.28  He has been followed for moderate aortic stenosis. Most recent echocardiogram 10/31/19 with LVEF=60-65%, mild LVH. The aortic valve leaflets are thickened and calcified with limited leaflet excursion. Mean gradient 23.2 mmHg, peak gradient 41.2 mmHg, AVA 0.98 cm2, dimensionless index 0.28, stroke-volume index 26. He was recently seen in the office by Dr. Domenic [name] and described worsening dyspnea on exertion and progressive fatigue and TAVR work-up was initiated.  He was seen in the office by Dr. Angelena [name] on 11/02/2019 for structural heart consultation. Diagnostic heart catheterization was set up for 11/14/2019 which showed severe, calcified proximal LAD stenosis; severe, calcified mid LAD stenosis at the takeoff of the moderate caliber diagonal branch; severe distal AV groove stenosis in the left dominant Circumflex; severe OM 2 stenosis, moderate caliber non-dominant RCA with patent proximal stent with moderately severe restenosis, & severe mid RCA stenosis. Dr. Angelena [name] felt he would benefit from bypass of the LAD and potentially the OM branch and left sided PDA. If he is not felt to be a candidate for CABG, we would plan staged PCI of the LAD which would require atherectomy prior to stent placement. Cardiac gated CTA of the heart reveals anatomical characteristics consistent with aortic stenosis suitable for treatment by transcatheter aortic valve replacement without any significant complicating features. However, there is bulky calcifications noted  in the noncoronary cusp. CTA of the aorta and iliac vessels demonstrate what appear to be adequate pelvic vascular access to facilitate a transfemoral approach.  He reports worsening dyspnea on exertion over the past 6 to 12 months which has severely limited his activities. He occasionally has swelling in his feet as well as orthopnea. He denies PND and sleeps with a CPAP. He denies dizziness or  syncope. He has had progressive fatigue as well.   He underwent successful PTCA/DES x 2 in the mid LAD on 11/22/19 by Dr. Angelena [name].     Past Medical History:  Diagnosis Date  . Anxiety   . Aortic atherosclerosis ([place]) 12/19/2017  . Arthritis   . Bursitis of hip   . Cervical spondylosis   . Chronic headaches   . Coronary atherosclerosis of native coronary artery    BMS nondominant RCA 12/2004  . Essential hypertension   . GERD (gastroesophageal reflux disease)   . History of adenomatous polyp of colon   . History of kidney stones   . History of MI (myocardial infarction) 12/2004  . History of viral meningitis 02/28/2018   December 2019  . Hyperlipidemia   . Internal hemorrhoids   . Low back pain   . Nocturia more than twice per night   . OSA (obstructive sleep apnea)   . Spinal stenosis   . Type 2 diabetes mellitus treated with insulin ([place])   . Wears dentures    Upper        Past Surgical History:  Procedure Laterality Date  . CARDIAC CATHETERIZATION  06-14-2004 dr Lia Foyer   total occlusion mD1, RI 30-40%, mRCA nondominant hazy 80% and scattered 30-40%, ef 71% (medical mangement)  . CARDIOVASCULAR STRESS TEST  09-17-2015 dr [name]   Low risk nuclear study w/ reversible mild small anteroapical defect, normal LV function and wall motion, nuclear stress ef 61%  . CATARACT EXTRACTION W/ INTRAOCULAR LENS IMPLANT Right 07/2014  . COLONOSCOPY  09/10/2008   normal  . CORONARY ANGIOPLASTY WITH STENT PLACEMENT  12-17-2004 dr benismhon/ dr Olevia Perches   nonobstructive cad LAD and LCFx, BMS x1 to total occlusion RCA   . CYSTOSCOPY W/ URETEROSCOPY W/ LITHOTRIPSY  08/2010  . ERCP  03/14/2008  . EYE SURGERY Bilateral    cataracts  . LAPAROSCOPIC CHOLECYSTECTOMY  05/2012  . LEFT HEART CATHETERIZATION WITH CORONARY ANGIOGRAM N/A 05/14/2011   Procedure: LEFT HEART CATHETERIZATION WITH CORONARY ANGIOGRAM; Surgeon: Sherren Mocha, MD; Location: [facility] CATH LAB; Service: Cardiovascular; Laterality:  N/A; non-obstructive LM, LAD, LCFx, patent RCA stent  . LUMBAR LAMINECTOMY/DECOMPRESSION MICRODISCECTOMY N/A 02/24/2017   Procedure: Microlumbar decompression L4-5, L5-S1; Surgeon: Susa Day, MD; Location: [facility] ORS; Service: Orthopedics; Laterality: N/A; 120 mins  . RIGHT/LEFT HEART CATH AND CORONARY ANGIOGRAPHY N/A 11/14/2019   Procedure: RIGHT/LEFT HEART CATH AND CORONARY ANGIOGRAPHY; Surgeon: Burnell Blanks, MD; Location: [facility] CV LAB; Service: Cardiovascular; Laterality: N/A;  . ROTATOR CUFF REPAIR Right 03/2002  . ROTATOR CUFF REPAIR Left 12/11/2015  . TOTAL HIP ARTHROPLASTY Right 12/20/2008  . TOTAL HIP ARTHROPLASTY Left 07/05/2018   Procedure: TOTAL HIP ARTHROPLASTY ANTERIOR APPROACH; Surgeon: Gaynelle Arabian, MD; Location: [facility] ORS; Service: Orthopedics; Laterality: Left; 136min  . TOTAL KNEE ARTHROPLASTY Bilateral left 12-11-2003/ right 07-31-2004 dr Wynelle Link [facility]  . UPPER GASTROINTESTINAL ENDOSCOPY  01/08/2008   bx, inlet patch, duodenitis  . YAG LASER APPLICATION Right 2/95/2841   Procedure: YAG LASER APPLICATION; Surgeon: Williams Che, MD; Location: [facility] ORS; Service: Ophthalmology; Laterality: Right;   Home Medications:  Prior to Admission medications   Medication Sig Start Date End Date Taking? Authorizing Provider  acetaminophen (TYLENOL) 650 MG CR tablet Take 1,300 mg by mouth in the morning, at noon, and at bedtime.    Yes [provider]  amLODipine (NORVASC) 5 MG tablet TAKE 1 TABLET BY MOUTH DAILY  Patient taking differently: Take 5 mg by mouth daily.  10/24/19  Yes Dettinger, [name] Kaufmann, MD  aspirin EC 81 MG tablet Take 81 mg by mouth at bedtime.    Yes [provider]  atorvastatin (LIPITOR) 20 MG tablet TAKE 1 TABLET BY MOUTH IN THE EVENING  Patient taking differently: Take 20 mg by mouth at bedtime.  10/24/19  Yes Dettinger, [name] Kaufmann, MD  butalbital-acetaminophen-caffeine (FIORICET) [phone] MG tablet Take 1 tablet by mouth 2  (two) times daily as needed for headache.   Yes [provider]  cetirizine (ZYRTEC) 10 MG tablet Take 10 mg by mouth daily with supper.    Yes [provider]  Cholecalciferol (VITAMIN D3) 50 MCG (2000 UT) capsule Take 4,000 Units by mouth daily.    Yes [provider]  Continuous Blood Gluc Receiver (DEXCOM G6 RECEIVER) DEVI USE TO CHECK BLOOD SUGAR UP TO 4 TIMES DAILY AS DIRECTED. 1 DEVICE PER YEAR. DX: E11.9 06/28/19  Yes Dettinger, [name] Kaufmann, MD  Continuous Blood Gluc Sensor (DEXCOM G6 SENSOR) MISC USE TO CHECK BLOOD SUGAR UP TO 4 TIMES DAILY AS DIRECTED. CHANGE SENSOR EVERY 30 DAYS. DX: E11.9 06/28/19  Yes Dettinger, [name] Kaufmann, MD  Continuous Blood Gluc Transmit (DEXCOM G6 TRANSMITTER) MISC USE TO CHECK BLOOD SUGAR UP TO 4 TIMES DAILY AS DIRECTED. CHANGE TRANSMITTER EVERY 3 MONTHS. DX: E11.9 06/28/19  Yes Dettinger, [name] Kaufmann, MD  docusate sodium (COLACE) 100 MG capsule Take 100 mg by mouth daily as needed for mild constipation or moderate constipation.    Yes [provider]  donepezil (ARICEPT) 5 MG tablet Take 5 mg by mouth at bedtime.   Yes [provider]  famotidine (PEPCID) 20 MG tablet TAKE 1 TABLET BY MOUTH TWICE DAILY WITH MEALS  Patient taking differently: Take 20 mg by mouth 2 (two) times daily.  10/24/19  Yes Dettinger, [name] Kaufmann, MD  glucose blood test strip Test 4 times daily Dx E11.59 11/23/18  Yes Dettinger, [name] Kaufmann, MD  HUMALOG KWIKPEN 100 UNIT/ML KwikPen INJECT 0 TO 15 UNITS SUBCUTANEOUSLY 3 TIMES DAILY BEFORE MEALS ; 70-150=8 UNITS, 151-200= 9 UNITS.  Patient taking differently: Inject 15-20 Units into the skin 3 (three) times daily before meals. Sliding scale 07/17/19  Yes Dettinger, [name] Kaufmann, MD  isosorbide mononitrate (IMDUR) 30 MG 24 hr tablet TAKE 1 TABLET BY MOUTH IN THE EVENING  Patient taking differently: Take 30 mg by mouth at bedtime.  09/10/19  Yes Satira Sark, MD  LANTUS SOLOSTAR 100 UNIT/ML Solostar Pen INJECT  SUBCUTANEOUSLY 52 UNITS AT BEDTIME  Patient taking differently: Inject 56 Units into the skin at bedtime.  04/23/19  Yes Dettinger, [name] Kaufmann, MD  losartan (COZAAR) 50 MG tablet TAKE 1 TABLET BY MOUTH DAILY  Patient taking differently: Take 50 mg by mouth daily.  07/26/19  Yes Dettinger, [name] Kaufmann, MD  metFORMIN (GLUCOPHAGE-XR) 500 MG 24 hr tablet TAKE 2 TABLETS BY MOUTH TWICE DAILY  Patient taking differently: Take 1,000 mg by mouth in the morning and at bedtime.  09/10/19  Yes Dettinger, [name] Kaufmann, MD  Omega-3 Fatty Acids (FISH OIL) 1200 MG CAPS Take 1,200-2,400 mg by mouth See  admin instructions. Tale 1200 mg in the morning and 2400 mg in the evening   Yes [provider]  PARoxetine (PAXIL) 20 MG tablet TAKE 1 TABLET BY MOUTH DAILY  Patient taking differently: Take 20 mg by mouth at bedtime.  10/24/19  Yes Dettinger, [name] Kaufmann, MD  tamsulosin (FLOMAX) 0.4 MG CAPS capsule TAKE 1 CAPSULE BY MOUTH DAILY  Patient taking differently: Take 0.4 mg by mouth daily with supper.  12/18/18  Yes Dettinger, [name] Kaufmann, MD  Turmeric Curcumin 500 MG CAPS Take 500 mg by mouth daily.   Yes [provider]  vitamin B-12 (CYANOCOBALAMIN) 1000 MCG tablet Take 2,000 mcg by mouth daily.   Yes [provider]  nitroGLYCERIN (NITROSTAT) 0.4 MG SL tablet DISSOLVE ONE TABLET UNDER THE TONGUE EVERY 5 MINUTES AS NEEDED FOR CHEST PAIN. DO NOT EXCEED A TOTAL OF 3 DOSES IN 15 MINUTES  Patient taking differently: Place 0.4 mg under the tongue every 5 (five) minutes as needed for chest pain. DISSOLVE ONE TABLET UNDER THE TONGUE EVERY 5 MINUTES AS NEEDED FOR CHEST PAIN. DO NOT EXCEED A TOTAL OF 3 DOSES IN 15 MINUTES 10/24/19   Satira Sark, MD  Inpatient Medications:  Scheduled Meds:  . acetaminophen 1,000 mg Oral Q8H  . amLODipine 5 mg Oral Daily  . aspirin EC 81 mg Oral Daily  . atorvastatin 40 mg Oral q1800  . docusate sodium 100 mg Oral BID WC  . donepezil 5 mg Oral QHS  . famotidine 20 mg Oral  BID WC  . furosemide 40 mg Intravenous Q12H  . insulin aspart 15-20 Units Subcutaneous TID WC  . insulin glargine 62 Units Subcutaneous QHS  . isosorbide mononitrate 30 mg Oral Daily  . loratadine 10 mg Oral Q supper  . PARoxetine 20 mg Oral Daily  . sodium chloride flush 3 mL Intravenous Q12H  . tamsulosin 0.4 mg Oral Q supper   Continuous Infusions:  . sodium chloride    PRN Meds:  sodium chloride, artificial tears, hydrocortisone, nitroGLYCERIN, ondansetron (ZOFRAN) IV, sodium chloride flush  Allergies:       Allergies  Allergen Reactions  . Ace Inhibitors Hives, Swelling and Rash    Rash,hives,tongue swelling  . Angiotensin Receptor Blockers     Unknown reaction   . Ciprofloxacin Other (See Comments)    confusion  . Oxycodone-Acetaminophen Other (See Comments)    Unknown reaction  . Robaxin [Methocarbamol] Other (See Comments)    Confusion   . Toradol [Ketorolac Tromethamine] Other (See Comments)    confusion  . Valium Other (See Comments)    Hallucinations; confusion  . Doxycycline Hives and Rash   Social History:  Social History        Socioeconomic History  . Marital status: Married    Spouse name: Dione   . Number of children: 1  . Years of education: HS  . Highest education level: High school graduate  Occupational History  . Occupation: RETIRED    Employer: [employer]    Comment: Power plant  Tobacco Use  . Smoking status: Former Smoker    Packs/day: 1.00    Years: 20.00    Pack years: 20.00    Types: Cigarettes    Start date: 01/18/1963    Quit date: 08/11/1988    Years since quitting: 31.2  . Smokeless tobacco: Former Systems developer    Types: Chew    Quit date: 01/11/1989  . Tobacco comment: chewed 1 pack tobacco/day for 15 years  Vaping Use  .  Vaping Use: Never used  Substance and Sexual Activity  . Alcohol use: No    Alcohol/week: 0.0 standard drinks  . Drug use: No  . Sexual activity: Yes  Other Topics Concern  . Not on file  Social History  Narrative   No regular exercise      Daily caffeine use: 3 cups   Patient is right handed.    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 Difficulty of Paying Living Expenses: Not on file  Food Insecurity:   . Worried About Charity fundraiser in the Last Year: Not on file  . Ran Out of Food in the Last Year: Not on file  Transportation Needs:   . Lack of Transportation (Medical): Not on file  . Lack of Transportation (Non-Medical): Not on file  Physical Activity:   . Days of Exercise per Week: Not on file  . Minutes of Exercise per Session: Not on file  Stress:   . Feeling of Stress : Not on file  Social Connections:   . Frequency of Communication with Friends and Family: Not on file  . Frequency of Social Gatherings with Friends and Family: Not on file  . Attends Religious Services: Not on file  . Active Member of Clubs or Organizations: Not on file  . Attends Archivist Meetings: Not on file  . Marital Status: Not on file  Intimate Partner Violence:   . Fear of Current or Ex-Partner: Not on file  . Emotionally Abused: Not on file  . Physically Abused: Not on file  . Sexually Abused: Not on file   Family History:       Family History  Problem Relation Age of Onset  . Colon cancer Mother    Diagnosed age 51  . Cancer Mother 109  . Heart disease Father   . Heart attack Father   . Breast cancer Sister   . Heart disease Brother   . Brain cancer Sister   . Heart disease Brother   . Bladder Cancer Brother   . Cancer - Other Brother    ROS:  Please see the history of present illness.  All other ROS reviewed and negative.  Physical Exam/Data:         Vitals:   11/16/19 0100 11/16/19 0400 11/16/19 0420 11/16/19 0940  BP:  (!) 161/74  (!) 151/74  Pulse:  64  67  Resp: 16 20  20   Temp: 97.8 F (36.6 C)  97.7 F (36.5 C)    TempSrc: Axillary  Oral   SpO2:  95%  95%  Weight:   104.7 kg   Height:        Intake/Output Summary (Last 24 hours) at 11/16/2019 1006  Last data filed at 11/16/2019 0837     Gross per 24 hour  Intake 620 ml  Output 1725 ml  Net -1105 ml   Last 3 Weights 11/16/2019 11/15/2019 11/14/2019  Weight (lbs) 230 lb 14.4 oz 232 lb 230 lb  Weight (kg) 104.736 kg 105.235 kg 104.327 kg   Body mass index is 37.27 kg/m.  General: Well nourished, well developed, in no acute distress, morbid obesity  HEENT: normal  Lymph: no adenopathy  Neck: no JVD  Endocrine: No thryomegaly  Cardiac: normal S1, S2; RRR; 3 out of 6 harsh,  systolic ejection murmur  Lungs: clear to auscultation bilaterally, no wheezing, rhonchi or rales  Abd: soft, nontender, no hepatomegaly  Ext: no edema  Musculoskeletal: No deformities, BUE and BLE strength normal and equal  Skin: warm and dry  Neuro: CNs 2-12 intact, no focal abnormalities noted  Psych: Normal affect  EKG: The EKG was personally reviewed and demonstrates: Sinus bradycardia  Telemetry: Telemetry was personally reviewed and demonstrates: Sinus  Relevant CV Studies:  11/14/19  RIGHT/LEFT HEART CATH AND CORONARY ANGIOGRAPHY  Conclusion  Prox RCA-1 lesion is 70% stenosed.  Prox RCA-2 lesion is 95% stenosed.  LPAV lesion is 90% stenosed.  Lat 3rd Mrg lesion is 80% stenosed.  Prox Cx to Mid Cx lesion is 30% stenosed.  Prox LAD to Mid LAD lesion is 99% stenosed.  Mid LAD lesion is 99% stenosed.  2nd Diag lesion is 90% stenosed. 1. Severe proximal LAD stenosis, calcified. Severe mid LAD stenosis at the takeoff of the moderate caliber diagonal branch, calcified.  2. Severe distal AV groove stenosis in the left dominant Circumflex. Severe OM 2 stenosis.  3. Moderate caliber non-dominant RCA with patent proximal stent with moderately severe restenosis. Severe mid RCA stenosis.  4. Moderately severe to severe aortic stenosis (mean gradient 26.6 mmHg, peak to  peak gradient 31 mmHg, AVA 1.06 cm2).  Recommendations: He has moderately severe to severe AS and three vessel CAD. I will admit him to telemetry. Given elevated pressures, will begin IV Lasix. I will ask our CT surgery team to see him to discuss CABG/surgical AVR vs staged PCI and TAVR. Will obtain pre-TAVR CT scans to fully assess his aorta and access.  In regards to bypass, the RCA is non-dominant. I think he would benefit from bypass of the LAD and potentially the OM branch and left sided PDA. If he is not felt to be a candidate for CABG, we would plan staged PCI of the LAD which would require atherectomy prior to stent placement. The LAD is calcified. The proximal LAD stenosis could be easily treated with orbital atherectomy and stenting. The mid LAD lesion is at the takeoff of a moderate caliber diagonal branch which also has a severe ostial stenosis. This would be higher risk in regards to potentially losing flow down that diagonal branch post stenting of the LAD.  He has mild memory issues but is overall functional at baseline, recently limited by dyspnea with exertion. He may be a candidate for open surgery with AVR and bypass.  __________________  Echo 10/31/19 IMPRESSIONS  1. Left ventricular ejection fraction, by estimation, is 60 to 65%. The  left ventricle has normal function. The left ventricle has no regional  wall motion abnormalities. There is mild left ventricular hypertrophy.  Left ventricular diastolic parameters  are indeterminate.  2. Right ventricular systolic function is normal. The right ventricular  size is normal. Tricuspid regurgitation signal is inadequate for assessing  PA pressure.  3. The mitral valve is grossly normal with mild annular calcification.  Trivial mitral valve regurgitation.  4. The aortic valve is tricuspid. There is moderate calcification of the  aortic valve. Aortic valve regurgitation is not visualized. Moderate to  severe aortic valve stenosis.  Aortic valve mean gradient measures 23.2  mmHg. Aortic valve Vmax measures 3.21  m/s. Dimentionless index 0.28.  5. Aortic dilatation noted. There is borderline dilatation of the  ascending aorta, measuring 40 mm.  6. Unable to estimate CVP.   FINDINGS  Left Ventricle: Left ventricular ejection fraction, by estimation,  is 60  to 65%. The left ventricle has normal function. The left ventricle has no  regional wall motion abnormalities. The left ventricular internal cavity  size was normal in size. There is  mild left ventricular hypertrophy. Left ventricular diastolic parameters  are indeterminate.   Right Ventricle: The right ventricular size is normal. No increase in  right ventricular wall thickness. Right ventricular systolic function is  normal. Tricuspid regurgitation signal is inadequate for assessing PA  pressure.   Left Atrium: Left atrial size was normal in size.   Right Atrium: Right atrial size was normal in size.   Pericardium: There is no evidence of pericardial effusion.   Mitral Valve: The mitral valve is grossly normal. Mild mitral annular  calcification. Trivial mitral valve regurgitation.   Tricuspid Valve: The tricuspid valve is grossly normal. Tricuspid valve  regurgitation is mild.   Aortic Valve: The aortic valve is tricuspid. There is moderate  calcification of the aortic valve. There is moderate aortic valve annular  calcification. Aortic valve regurgitation is not visualized. Moderate to  severe aortic stenosis is present. Aortic  valve mean gradient measures 23.2 mmHg. Aortic valve peak gradient  measures 41.2 mmHg. Aortic valve area, by VTI measures 0.98 cm.   Pulmonic Valve: The pulmonic valve was grossly normal. Pulmonic valve  regurgitation is trivial.   Aorta: Aortic dilatation noted. There is borderline dilatation of the  ascending aorta, measuring 40 mm.   Venous: Unable to estimate CVP. IVC assessment for right atrial pressure  unable  to be performed due to mechanical ventilation.   IAS/Shunts: The interatrial septum was not well visualized.    LEFT VENTRICLE  PLAX 2D  LVIDd: 5.37 cm Diastology  LVIDs: 3.11 cm LV e' medial: 6.87 cm/s  LV PW: 1.20 cm LV E/e' medial: 10.9  LV IVS: 1.16 cm LV e' lateral: 5.71 cm/s  LVOT diam: 2.10 cm LV E/e' lateral: 13.2  LV SV: 56  LV SV Index: 26  LVOT Area: 3.46 cm   LV Volumes (MOD)  LV vol d, MOD A2C: 93.9 ml  LV vol d, MOD A4C: 120.0 ml  LV vol s, MOD A2C: 37.0 ml  LV vol s, MOD A4C: 50.0 ml  LV SV MOD A2C: 56.9 ml  LV SV MOD A4C: 120.0 ml  LV SV MOD BP: 64.7 ml   RIGHT VENTRICLE  RV S prime: 17.40 cm/s  TAPSE (M-mode): 2.3 cm   LEFT ATRIUM Index  LA diam: 3.00 cm 1.39 cm/m  LA Vol (A2C): 41.9 ml 19.46 ml/m  LA Vol (A4C): 60.9 ml 28.28 ml/m  LA Biplane Vol: 23.90 ml/m  AORTIC VALVE  AV Area (Vmax): 1.01 cm  AV Area (Vmean): 1.03 cm  AV Area (VTI): 0.98 cm  AV Vmax: 320.80 cm/s  AV Vmean: 221.600 cm/s  AV VTI: 0.578 m  AV Peak Grad: 41.2 mmHg  AV Mean Grad: 23.2 mmHg  LVOT Vmax: 93.80 cm/s  LVOT Vmean: 66.200 cm/s  LVOT VTI: 0.163 m  LVOT/AV VTI ratio: 0.28   AORTA  Ao Root diam: 3.70 cm  Ao Asc diam: 4.00 cm   MITRAL VALVE TRICUSPID VALVE  MV Area (PHT): TR Peak grad: 7.0 mmHg  MV Decel Time: 209 msec TR Vmax: 132.00 cm/s  MR Peak grad: 10.1 mmHg  MR Vmax: 159.00 cm/s SHUNTS  MV E velocity: 75.20 cm/s Systemic VTI: 0.16 m  MV A velocity: 85.90 cm/s Systemic Diam: 2.10 cm  MV E/A ratio: 0.88  _________________  Cardiac CT  11/15/19  IMPRESSION:  1. Tricuspid aortic valve with severe aortic stenosis (calcium score  = 2012).  2. Bulky calcifications noted in the [place].  3. Annular measurements appropriate for 26 mm Edwards Sapien 3 TAVR.  4. No significant annular or subannular calcifications.  5. Sufficient coronary to annulus distance.  6. Optimal Fluoroscopic Angle for Delivery: RAO 3 CAU 6  7. Dilated pulmonary artery suggestive of  pulmonary hypertension.  8. Severe 3-vessel coronary calcifications.  Lake Bells T. O'Neal, MD  __________________  CT angio/chest/abd/pelvis 11/15/19  IMPRESSION:  1. Vascular findings and measurements pertinent to potential TAVR  procedure, as detailed above.  2. Severe thickening calcification of the aortic valve, compatible  with reported clinical history of severe aortic stenosis.  3. Aortic atherosclerosis, in addition to left main and 3 vessel  coronary artery disease. Assessment for potential risk factor  modification, dietary therapy or pharmacologic therapy may be  warranted, if clinically indicated.  4. Calcified pleural plaques in the thorax bilaterally compatible  with asbestos related pleural disease.  5. Additional incidental findings, as above.  Laboratory Data:  High Sensitivity Troponin:  Last [place]     Hematology  Last Labs                                                                        BNP  Last Labs     DDimer  Last Labs     Radiology/Studies:  CARDIAC CATHETERIZATION  Result Date: 11/14/2019   Prox RCA-1 lesion is 70% stenosed.  Prox RCA-2 lesion is 95% stenosed.  LPAV lesion is 90% stenosed.  Lat 3rd Mrg lesion is 80% stenosed.  Prox Cx to Mid Cx lesion is 30% stenosed.  Prox LAD to Mid LAD lesion is 99% stenosed.  Mid LAD lesion is 99% stenosed.  2nd Diag lesion is 90% stenosed. 1. Severe proximal LAD stenosis, calcified. Severe mid LAD stenosis at the takeoff of the moderate caliber diagonal branch, calcified. 2. Severe distal AV groove stenosis in the left dominant Circumflex. Severe OM 2 stenosis. 3. Moderate caliber non-dominant RCA with patent proximal stent with moderately severe restenosis. Severe mid RCA stenosis. 4. Moderately severe to severe aortic stenosis (mean gradient 26.6 mmHg, peak to peak  gradient 31 mmHg, AVA 1.06 cm2). Recommendations: He has moderately severe to severe AS and three vessel CAD. I will admit him to telemetry. Given elevated pressures, will begin IV Lasix. I will ask our CT surgery team to see him to discuss CABG/surgical AVR vs staged PCI and TAVR. Will obtain pre-TAVR CT scans to fully assess his aorta and access. In regards to bypass, the RCA is non-dominant. I think he would benefit from bypass of the LAD and potentially the OM branch  and left sided PDA. If he is not felt to be a candidate for CABG, we would plan staged PCI of the LAD which would require atherectomy prior to stent placement. The LAD is calcified. The proximal LAD stenosis could be easily treated with orbital atherectomy and stenting. The mid LAD lesion is at the takeoff of a moderate caliber diagonal branch which also has a severe ostial stenosis. This would be higher risk in regards to potentially losing flow down that diagonal branch post stenting of the LAD. He has mild memory issues but is overall functional at baseline, recently limited by dyspnea with exertion. He may be a candidate for open surgery with AVR and bypass.  CT CORONARY MORPH W/CTA COR W/SCORE W/CA W/CM &/OR WO/CM  Result Date: 11/16/2019  EXAM: OVER-READ INTERPRETATION CT CHEST The following report is an over-read performed by radiologist Dr. Vinnie Langton of [facility], [place] on 11/16/2019. This over-read does not include interpretation of cardiac or coronary anatomy or pathology. The coronary calcium score/coronary CTA interpretation by the cardiologist is attached. COMPARISON: Chest CT 12/18/2017. FINDINGS: Extracardiac findings will be described separately under dictation for contemporaneously obtained CTA chest, abdomen and pelvis. IMPRESSION: Please see separate dictation for contemporaneously obtained CTA chest, abdomen and pelvis 11/15/2019 for full description of relevant extracardiac findings. Electronically Signed By:  Vinnie Langton M.D. On: 11/16/2019 07:29   STS Risk Calculator:  Procedure: AVR + CAB  Risk of Mortality:  3.506%  Renal Failure:  5.241%  Permanent Stroke:  2.876%  Prolonged Ventilation:  11.333%  DSW Infection:  0.528%  Reoperation:  3.308%  Morbidity or Mortality:  19.849%  Short Length of Stay:  18.974%  Long Length of Stay:  9.889%    Assessment and Plan:   This 79 year old gentleman has a 6 to 46-month history of progressive exertional shortness of breath and fatigue which is worsened recently and is markedly limiting his ability to be active. He has had no chest pain or pressure. He has had some swelling in his feet. He reports some orthopnea. He has been followed for moderate aortic stenosis and had an echocardiogram on 10/31/19 showing a mean gradient across the aortic valve of 23.2 mmHg and an aortic valve area of 0.98 cm. Dimensionless index of 0.28. Cardiac catheterization on 11/14/2019 showed severe multivessel coronary disease. The LAD had a 99% mid vessel stenosis and a second 90% stenosis further downstream to the takeoff of a second diagonal branch that were successfully treated with PCI/DES. The left circumflex has a large third marginal branch that had 80% stenosis in the lateral subbranch. The right coronary artery is smaller and nondominant with 70% and 90% stenoses. The mean gradient across aortic valve was 26.6 mmHg the peak to peak gradient of 31 mmHg. Aortic valve area was 1.06 cm. He underwent CT scan work-up for consideration of TAVR. His gated cardiac CTA shows a trileaflet aortic valve with calcified leaflets and severely restricted mobility. Annular measurement was suitable for a 26 mm Edwards SAPIEN 3 valve. Abdominal and pelvic CTA shows adequate pelvic vascular anatomy to allow transfemoral insertion.  His echocardiogram data suggest moderate aortic stenosis although his valve is heavily calcified and has restricted mobility and looks like a severely  stenotic valve. His gated cardiac CTA confirms heavy calcification of the valve leaflets with restricted mobility and again it looks like a severely stenotic valve. He presented with New [place] Heart Association class III symptoms of exertional fatigue and shortness of breath with lower extremity edema consistent  with chronic diastolic congestive heart failure. He has significantly improved with diuresis. I think the best option for treating this patient is going to be TAVR. His operative risk for open surgical AVR would be significantly increased due to his morbid obesity, chronic degenerative arthritis status post bilateral knee, hip, and shoulder replacements with the previous spine surgery, and other comorbidities including insulin-dependent diabetes, sleep apnea, and advanced age. I discussed all this with the patient and his wife and answered all their questions.  The patient and his wife were counseled at length regarding treatment alternatives for management of severe symptomatic aortic stenosis. The risks and benefits of surgical intervention has been discussed in detail. Long-term prognosis with medical therapy was discussed. Alternative approaches such as conventional surgical aortic valve replacement, transcatheter aortic valve replacement, and palliative medical therapy were compared and contrasted at length. This discussion was placed in the context of the patient's own specific clinical presentation and past medical history. All of their questions have been addressed.  Following the decision to proceed with transcatheter aortic valve replacement, a discussion was held regarding what types of management strategies would be attempted intraoperatively in the event of life-threatening complications, including whether or not the patient would be considered a candidate for the use of cardiopulmonary bypass and/or conversion to open sternotomy for attempted surgical intervention. I think he would be a  candidate for emergent sternotomy to manage any intraoperative complications depending on the situation. The patient is aware of the fact that transient use of cardiopulmonary bypass may be necessary. The patient has been advised of a variety of complications that might develop including but not limited to risks of death, stroke, paravalvular leak, aortic dissection or other major vascular complications, aortic annulus rupture, device embolization, cardiac rupture or perforation, mitral regurgitation, acute myocardial infarction, arrhythmia, heart block or bradycardia requiring permanent pacemaker placement, congestive heart failure, respiratory failure, renal failure, pneumonia, infection, other late complications related to structural valve deterioration or migration, or other complications that might ultimately cause a temporary or permanent loss of functional independence or other long term morbidity. The patient provides full informed consent for the procedure as described and all questions were answered.   [name], MD

## 2019-12-04 ENCOUNTER — Encounter (HOSPITAL_COMMUNITY): Payer: Self-pay | Admitting: Cardiovascular Disease

## 2019-12-04 ENCOUNTER — Inpatient Hospital Stay (HOSPITAL_COMMUNITY): Payer: Medicare Other

## 2019-12-04 ENCOUNTER — Inpatient Hospital Stay (HOSPITAL_COMMUNITY): Payer: Medicare Other | Admitting: Certified Registered Nurse Anesthetist

## 2019-12-04 ENCOUNTER — Other Ambulatory Visit: Payer: Self-pay | Admitting: *Deleted

## 2019-12-04 ENCOUNTER — Encounter (HOSPITAL_COMMUNITY)
Admission: RE | Disposition: A | Discharge status: Home / Self Care | Payer: Medicare Other | Source: Home / Self Care | Attending: Cardiovascular Disease

## 2019-12-04 ENCOUNTER — Inpatient Hospital Stay (HOSPITAL_COMMUNITY)
Admission: RE | Admit: 2019-12-04 | Discharge: 2019-12-05 | DRG: 267 | Disposition: A | Discharge status: Home / Self Care | Payer: Medicare Other | Attending: Cardiovascular Disease | Admitting: Cardiovascular Disease

## 2019-12-04 ENCOUNTER — Inpatient Hospital Stay (HOSPITAL_COMMUNITY): Payer: Medicare Other | Admitting: Vascular Surgery

## 2019-12-04 ENCOUNTER — Other Ambulatory Visit: Payer: Self-pay

## 2019-12-04 DIAGNOSIS — Z87891 Personal history of nicotine dependence: Secondary | ICD-10-CM

## 2019-12-04 DIAGNOSIS — Z9049 Acquired absence of other specified parts of digestive tract: Secondary | ICD-10-CM

## 2019-12-04 DIAGNOSIS — Z952 Presence of prosthetic heart valve: Secondary | ICD-10-CM | POA: Diagnosis not present

## 2019-12-04 DIAGNOSIS — I252 Old myocardial infarction: Secondary | ICD-10-CM | POA: Diagnosis not present

## 2019-12-04 DIAGNOSIS — Z7984 Long term (current) use of oral hypoglycemic drugs: Secondary | ICD-10-CM

## 2019-12-04 DIAGNOSIS — Z8661 Personal history of infections of the central nervous system: Secondary | ICD-10-CM | POA: Diagnosis not present

## 2019-12-04 DIAGNOSIS — Z79899 Other long term (current) drug therapy: Secondary | ICD-10-CM | POA: Diagnosis not present

## 2019-12-04 DIAGNOSIS — I77810 Thoracic aortic ectasia: Secondary | ICD-10-CM | POA: Diagnosis not present

## 2019-12-04 DIAGNOSIS — Z006 Encounter for examination for normal comparison and control in clinical research program: Secondary | ICD-10-CM | POA: Diagnosis not present

## 2019-12-04 DIAGNOSIS — Z794 Long term (current) use of insulin: Secondary | ICD-10-CM | POA: Diagnosis not present

## 2019-12-04 DIAGNOSIS — I10 Essential (primary) hypertension: Secondary | ICD-10-CM | POA: Diagnosis present

## 2019-12-04 DIAGNOSIS — K219 Gastro-esophageal reflux disease without esophagitis: Secondary | ICD-10-CM | POA: Diagnosis present

## 2019-12-04 DIAGNOSIS — Z881 Allergy status to other antibiotic agents status: Secondary | ICD-10-CM

## 2019-12-04 DIAGNOSIS — Z7982 Long term (current) use of aspirin: Secondary | ICD-10-CM | POA: Diagnosis not present

## 2019-12-04 DIAGNOSIS — Z96643 Presence of artificial hip joint, bilateral: Secondary | ICD-10-CM | POA: Diagnosis present

## 2019-12-04 DIAGNOSIS — I350 Nonrheumatic aortic (valve) stenosis: Principal | ICD-10-CM

## 2019-12-04 DIAGNOSIS — G4733 Obstructive sleep apnea (adult) (pediatric): Secondary | ICD-10-CM | POA: Diagnosis present

## 2019-12-04 DIAGNOSIS — E785 Hyperlipidemia, unspecified: Secondary | ICD-10-CM | POA: Diagnosis not present

## 2019-12-04 DIAGNOSIS — Z955 Presence of coronary angioplasty implant and graft: Secondary | ICD-10-CM

## 2019-12-04 DIAGNOSIS — I2510 Atherosclerotic heart disease of native coronary artery without angina pectoris: Secondary | ICD-10-CM | POA: Diagnosis not present

## 2019-12-04 DIAGNOSIS — F419 Anxiety disorder, unspecified: Secondary | ICD-10-CM | POA: Diagnosis present

## 2019-12-04 DIAGNOSIS — Z8249 Family history of ischemic heart disease and other diseases of the circulatory system: Secondary | ICD-10-CM

## 2019-12-04 DIAGNOSIS — J61 Pneumoconiosis due to asbestos and other mineral fibers: Secondary | ICD-10-CM | POA: Diagnosis present

## 2019-12-04 DIAGNOSIS — E119 Type 2 diabetes mellitus without complications: Secondary | ICD-10-CM | POA: Diagnosis present

## 2019-12-04 DIAGNOSIS — Z888 Allergy status to other drugs, medicaments and biological substances status: Secondary | ICD-10-CM

## 2019-12-04 DIAGNOSIS — Z77090 Contact with and (suspected) exposure to asbestos: Secondary | ICD-10-CM | POA: Diagnosis present

## 2019-12-04 DIAGNOSIS — Z6837 Body mass index (BMI) 37.0-37.9, adult: Secondary | ICD-10-CM | POA: Diagnosis not present

## 2019-12-04 DIAGNOSIS — I700 Atherosclerosis of aorta: Secondary | ICD-10-CM | POA: Diagnosis present

## 2019-12-04 DIAGNOSIS — Z96653 Presence of artificial knee joint, bilateral: Secondary | ICD-10-CM | POA: Diagnosis present

## 2019-12-04 DIAGNOSIS — J90 Pleural effusion, not elsewhere classified: Secondary | ICD-10-CM | POA: Diagnosis not present

## 2019-12-04 DIAGNOSIS — Z885 Allergy status to narcotic agent status: Secondary | ICD-10-CM

## 2019-12-04 DIAGNOSIS — I25110 Atherosclerotic heart disease of native coronary artery with unstable angina pectoris: Secondary | ICD-10-CM | POA: Diagnosis not present

## 2019-12-04 HISTORY — PX: TEE WITHOUT CARDIOVERSION: SHX5443

## 2019-12-04 HISTORY — PX: TRANSCATHETER AORTIC VALVE REPLACEMENT, TRANSFEMORAL: SHX6400

## 2019-12-04 LAB — POCT I-STAT, CHEM 8
BUN: 17 mg/dL (ref 8–23)
BUN: 20 mg/dL (ref 8–23)
Calcium, Ion: 1.18 mmol/L (ref 1.15–1.40)
Calcium, Ion: 1.19 mmol/L (ref 1.15–1.40)
Chloride: 101 mmol/L (ref 98–111)
Chloride: 100 mmol/L (ref 98–111)
Creatinine, Ser: 0.9 mg/dL (ref 0.61–1.24)
Creatinine, Ser: 1 mg/dL (ref 0.61–1.24)
Glucose, Bld: 222 mg/dL — ABNORMAL HIGH (ref 70–99)
Glucose, Bld: 261 mg/dL — ABNORMAL HIGH (ref 70–99)
HCT: 38 % — ABNORMAL LOW (ref 39.0–52.0)
HCT: 36 % — ABNORMAL LOW (ref 39.0–52.0)
Hemoglobin: 12.9 g/dL — ABNORMAL LOW (ref 13.0–17.0)
Hemoglobin: 12.2 g/dL — ABNORMAL LOW (ref 13.0–17.0)
Potassium: 3.9 mmol/L (ref 3.5–5.1)
Potassium: 4.8 mmol/L (ref 3.5–5.1)
Sodium: 140 mmol/L (ref 135–145)
Sodium: 138 mmol/L (ref 135–145)
TCO2: 24 mmol/L (ref 22–32)
TCO2: 28 mmol/L (ref 22–32)

## 2019-12-04 LAB — ECHOCARDIOGRAM LIMITED
AR max vel: 2.36 cm2
AV Area VTI: 2.55 cm2
AV Area mean vel: 2.59 cm2
AV Mean grad: 6 mmHg
AV Peak grad: 10.4 mmHg
Ao pk vel: 1.61 m/s

## 2019-12-04 LAB — POCT ACTIVATED CLOTTING TIME
Activated Clotting Time: 125 seconds
Activated Clotting Time: 279 seconds
Activated Clotting Time: 306 seconds
Activated Clotting Time: 99 seconds

## 2019-12-04 LAB — GLUCOSE, CAPILLARY: Glucose-Capillary: 220 mg/dL — ABNORMAL HIGH (ref 70–99)

## 2019-12-04 SURGERY — IMPLANTATION, AORTIC VALVE, TRANSCATHETER, FEMORAL APPROACH
Anesthesia: Monitor Anesthesia Care

## 2019-12-04 MED ORDER — HEPARIN (PORCINE) IN NACL 1000-0.9 UT/500ML-% IV SOLN
INTRAVENOUS | Status: DC | PRN
  Administered 2019-12-04 (×3): 500 mL

## 2019-12-04 MED ORDER — LACTATED RINGERS IV SOLN: INTRAVENOUS | Status: DC | PRN

## 2019-12-04 MED ORDER — PROTAMINE SULFATE 10 MG/ML IV SOLN
INTRAVENOUS | Status: DC | PRN
  Administered 2019-12-04: 150 mg via INTRAVENOUS

## 2019-12-04 MED ORDER — POTASSIUM CHLORIDE CRYS ER 20 MEQ PO TBCR
20.0000 meq | EXTENDED_RELEASE_TABLET | Freq: Every day | ORAL | Status: DC
  Administered 2019-12-05: 20 meq via ORAL
  Filled 2019-12-04: qty 1

## 2019-12-04 MED ORDER — TAMSULOSIN HCL 0.4 MG PO CAPS
0.4000 mg | ORAL_CAPSULE | Freq: Every day | ORAL | Status: DC
  Administered 2019-12-04: 0.4 mg via ORAL
  Filled 2019-12-04: qty 1

## 2019-12-04 MED ORDER — ACETAMINOPHEN 650 MG RE SUPP: 650.0000 mg | Freq: Four times a day (QID) | RECTAL | Status: DC | PRN

## 2019-12-04 MED ORDER — CHLORHEXIDINE GLUCONATE 4 % EX LIQD: 60.0000 mL | Freq: Once | CUTANEOUS | Status: DC

## 2019-12-04 MED ORDER — LORATADINE 10 MG PO TABS
10.0000 mg | ORAL_TABLET | Freq: Every day | ORAL | Status: DC
  Administered 2019-12-05: 10 mg via ORAL
  Filled 2019-12-04: qty 1

## 2019-12-04 MED ORDER — ATORVASTATIN CALCIUM 40 MG PO TABS
40.0000 mg | ORAL_TABLET | Freq: Every evening | ORAL | Status: DC
  Administered 2019-12-04: 40 mg via ORAL
  Filled 2019-12-04: qty 1

## 2019-12-04 MED ORDER — LIDOCAINE HCL (PF) 1 % IJ SOLN
INTRAMUSCULAR | Status: AC
  Filled 2019-12-04: qty 30

## 2019-12-04 MED ORDER — CLOPIDOGREL BISULFATE 75 MG PO TABS
75.0000 mg | ORAL_TABLET | Freq: Every day | ORAL | Status: DC
  Administered 2019-12-05: 75 mg via ORAL
  Filled 2019-12-04: qty 1

## 2019-12-04 MED ORDER — INSULIN ASPART 100 UNIT/ML ~~LOC~~ SOLN
15.0000 [IU] | Freq: Three times a day (TID) | SUBCUTANEOUS | Status: DC
  Administered 2019-12-04 – 2019-12-05 (×3): 15 [IU] via SUBCUTANEOUS

## 2019-12-04 MED ORDER — TRAMADOL HCL 50 MG PO TABS: 50.0000 mg | ORAL_TABLET | ORAL | Status: DC | PRN

## 2019-12-04 MED ORDER — SODIUM CHLORIDE 0.9% FLUSH
3.0000 mL | Freq: Two times a day (BID) | INTRAVENOUS | Status: DC
  Administered 2019-12-04 – 2019-12-05 (×2): 3 mL via INTRAVENOUS

## 2019-12-04 MED ORDER — IOHEXOL 350 MG/ML SOLN
INTRAVENOUS | Status: DC | PRN
  Administered 2019-12-04: 80 mL

## 2019-12-04 MED ORDER — METOPROLOL TARTRATE 5 MG/5ML IV SOLN: 2.5000 mg | INTRAVENOUS | Status: DC | PRN

## 2019-12-04 MED ORDER — LIDOCAINE HCL (PF) 1 % IJ SOLN
INTRAMUSCULAR | Status: DC | PRN
  Administered 2019-12-04: 12 mL

## 2019-12-04 MED ORDER — NITROGLYCERIN 0.4 MG SL SUBL: 0.4000 mg | SUBLINGUAL_TABLET | SUBLINGUAL | Status: DC | PRN

## 2019-12-04 MED ORDER — SODIUM CHLORIDE 0.9% FLUSH: 3.0000 mL | INTRAVENOUS | Status: DC | PRN

## 2019-12-04 MED ORDER — PROPOFOL 500 MG/50ML IV EMUL
INTRAVENOUS | Status: DC | PRN
  Administered 2019-12-04: 10 ug/kg/min via INTRAVENOUS

## 2019-12-04 MED ORDER — SODIUM CHLORIDE 0.9 % IV SOLN: INTRAVENOUS | Status: DC

## 2019-12-04 MED ORDER — MIDAZOLAM HCL 5 MG/5ML IJ SOLN
INTRAMUSCULAR | Status: DC | PRN
  Administered 2019-12-04 (×2): 1 mg via INTRAVENOUS

## 2019-12-04 MED ORDER — INSULIN LISPRO (1 UNIT DIAL) 100 UNIT/ML (KWIKPEN): 15.0000 [IU] | PEN_INJECTOR | Freq: Three times a day (TID) | SUBCUTANEOUS | Status: DC

## 2019-12-04 MED ORDER — ISOSORBIDE MONONITRATE ER 30 MG PO TB24
30.0000 mg | ORAL_TABLET | Freq: Every day | ORAL | Status: DC
  Administered 2019-12-04: 30 mg via ORAL
  Filled 2019-12-04: qty 1

## 2019-12-04 MED ORDER — CHLORHEXIDINE GLUCONATE 4 % EX LIQD
30.0000 mL | CUTANEOUS | Status: DC
  Filled 2019-12-04: qty 30

## 2019-12-04 MED ORDER — ACETAMINOPHEN 500 MG PO TABS
1000.0000 mg | ORAL_TABLET | Freq: Two times a day (BID) | ORAL | Status: DC
  Administered 2019-12-04 – 2019-12-05 (×2): 1000 mg via ORAL
  Filled 2019-12-04 (×2): qty 2

## 2019-12-04 MED ORDER — PAROXETINE HCL 20 MG PO TABS
20.0000 mg | ORAL_TABLET | Freq: Every day | ORAL | Status: DC
  Administered 2019-12-05: 20 mg via ORAL
  Filled 2019-12-04: qty 1

## 2019-12-04 MED ORDER — HEPARIN (PORCINE) IN NACL 1000-0.9 UT/500ML-% IV SOLN
INTRAVENOUS | Status: AC
  Filled 2019-12-04: qty 500

## 2019-12-04 MED ORDER — HEPARIN (PORCINE) IN NACL 1000-0.9 UT/500ML-% IV SOLN
INTRAVENOUS | Status: AC
  Filled 2019-12-04: qty 1000

## 2019-12-04 MED ORDER — IOHEXOL 350 MG/ML SOLN
INTRAVENOUS | Status: AC
  Filled 2019-12-04: qty 1

## 2019-12-04 MED ORDER — ACETAMINOPHEN 325 MG PO TABS: 650.0000 mg | ORAL_TABLET | Freq: Four times a day (QID) | ORAL | Status: DC | PRN

## 2019-12-04 MED ORDER — FAMOTIDINE 20 MG PO TABS
20.0000 mg | ORAL_TABLET | Freq: Two times a day (BID) | ORAL | Status: DC
  Administered 2019-12-04 – 2019-12-05 (×2): 20 mg via ORAL
  Filled 2019-12-04 (×2): qty 1

## 2019-12-04 MED ORDER — ASPIRIN EC 81 MG PO TBEC
81.0000 mg | DELAYED_RELEASE_TABLET | Freq: Every day | ORAL | Status: DC
  Administered 2019-12-04: 81 mg via ORAL
  Filled 2019-12-04: qty 1

## 2019-12-04 MED ORDER — VANCOMYCIN HCL IN DEXTROSE 1-5 GM/200ML-% IV SOLN
1000.0000 mg | Freq: Once | INTRAVENOUS | Status: AC
  Administered 2019-12-04: 1000 mg via INTRAVENOUS
  Filled 2019-12-04: qty 200

## 2019-12-04 MED ORDER — LACTATED RINGERS IV SOLN: INTRAVENOUS | Status: DC

## 2019-12-04 MED ORDER — INSULIN GLARGINE 100 UNIT/ML ~~LOC~~ SOLN
56.0000 [IU] | Freq: Every day | SUBCUTANEOUS | Status: DC
  Administered 2019-12-04: 56 [IU] via SUBCUTANEOUS
  Filled 2019-12-04 (×2): qty 0.56

## 2019-12-04 MED ORDER — FUROSEMIDE 40 MG PO TABS
60.0000 mg | ORAL_TABLET | Freq: Every day | ORAL | Status: DC
  Administered 2019-12-04 – 2019-12-05 (×2): 60 mg via ORAL
  Filled 2019-12-04 (×2): qty 1

## 2019-12-04 MED ORDER — CHLORHEXIDINE GLUCONATE 0.12 % MT SOLN
15.0000 mL | Freq: Once | OROMUCOSAL | Status: AC
  Administered 2019-12-04: 15 mL via OROMUCOSAL
  Filled 2019-12-04 (×2): qty 15

## 2019-12-04 MED ORDER — ONDANSETRON HCL 4 MG/2ML IJ SOLN
INTRAMUSCULAR | Status: DC | PRN
  Administered 2019-12-04: 4 mg via INTRAVENOUS

## 2019-12-04 MED ORDER — HEPARIN SODIUM (PORCINE) 1000 UNIT/ML IJ SOLN
INTRAMUSCULAR | Status: DC | PRN
  Administered 2019-12-04: 15000 [IU] via INTRAVENOUS

## 2019-12-04 MED ORDER — AMLODIPINE BESYLATE 10 MG PO TABS
10.0000 mg | ORAL_TABLET | Freq: Every day | ORAL | Status: DC
  Administered 2019-12-05: 10 mg via ORAL
  Filled 2019-12-04: qty 1

## 2019-12-04 MED ORDER — NITROGLYCERIN IN D5W 200-5 MCG/ML-% IV SOLN: 0.0000 ug/min | INTRAVENOUS | Status: DC

## 2019-12-04 MED ORDER — SODIUM CHLORIDE 0.9 % IV SOLN: 250.0000 mL | INTRAVENOUS | Status: DC | PRN

## 2019-12-04 MED ORDER — DEXMEDETOMIDINE HCL IN NACL 200 MCG/50ML IV SOLN
INTRAVENOUS | Status: DC | PRN
  Administered 2019-12-04: 104.32 ug via INTRAVENOUS

## 2019-12-04 MED ORDER — DONEPEZIL HCL 5 MG PO TABS: 5.0000 mg | ORAL_TABLET | Freq: Every day | ORAL | Status: DC

## 2019-12-04 MED ORDER — PHENYLEPHRINE HCL-NACL 20-0.9 MG/250ML-% IV SOLN
0.0000 ug/min | INTRAVENOUS | Status: DC
  Filled 2019-12-04: qty 250

## 2019-12-04 MED ORDER — AMLODIPINE BESYLATE 10 MG PO TABS
10.0000 mg | ORAL_TABLET | Freq: Every day | ORAL | 11 refills | Status: DC
Start: 2019-12-04 — End: 2020-01-10

## 2019-12-04 MED ORDER — ONDANSETRON HCL 4 MG/2ML IJ SOLN: 4.0000 mg | Freq: Four times a day (QID) | INTRAMUSCULAR | Status: DC | PRN

## 2019-12-04 MED ORDER — ATORVASTATIN CALCIUM 40 MG PO TABS
40.0000 mg | ORAL_TABLET | Freq: Every evening | ORAL | 11 refills | Status: DC
Start: 2019-12-04 — End: 2020-01-10

## 2019-12-04 MED ORDER — SODIUM CHLORIDE 0.9 % IV SOLN
1.5000 g | Freq: Two times a day (BID) | INTRAVENOUS | Status: DC
  Administered 2019-12-04 – 2019-12-05 (×2): 1.5 g via INTRAVENOUS
  Filled 2019-12-04 (×4): qty 1.5

## 2019-12-04 SURGICAL SUPPLY — 33 items
BAG SNAP BAND KOVER 36X36 (MISCELLANEOUS) ×4 IMPLANT
BLANKET WARM UNDERBOD FULL ACC (MISCELLANEOUS) ×2 IMPLANT
CABLE ADAPT PACING TEMP 12FT (ADAPTER) ×1 IMPLANT
CATH 26 ULTRA DELIVERY (CATHETERS) ×1 IMPLANT
CATH DIAG 6FR PIGTAIL ANGLED (CATHETERS) ×2 IMPLANT
CATH INFINITI 6F AL2 (CATHETERS) ×1 IMPLANT
CATH S G BIP PACING (CATHETERS) ×1 IMPLANT
CLOSURE MYNX CONTROL 6F/7F (Vascular Products) ×1 IMPLANT
CLOSURE PERCLOSE PROSTYLE (VASCULAR PRODUCTS) ×2 IMPLANT
CRIMPER (MISCELLANEOUS) ×1 IMPLANT
DEVICE INFLATION ATRION QL2530 (MISCELLANEOUS) ×1 IMPLANT
ELECT DEFIB PAD ADLT CADENCE (PAD) ×1 IMPLANT
GUIDEWIRE SAFE TJ AMPLATZ EXST (WIRE) ×1 IMPLANT
KIT HEART LEFT (KITS) ×2 IMPLANT
KIT MICROPUNCTURE NIT STIFF (SHEATH) ×1 IMPLANT
PACK CARDIAC CATHETERIZATION (CUSTOM PROCEDURE TRAY) ×2 IMPLANT
SHEATH 14X36 EDWARDS (SHEATH) ×1 IMPLANT
SHEATH BRITE TIP 7FR 35CM (SHEATH) ×1 IMPLANT
SHEATH PINNACLE 6F 10CM (SHEATH) ×1 IMPLANT
SHEATH PINNACLE 8F 10CM (SHEATH) ×1 IMPLANT
SLEEVE REPOSITIONING LENGTH 30 (MISCELLANEOUS) ×1 IMPLANT
STOPCOCK MORSE 400PSI 3WAY (MISCELLANEOUS) ×4 IMPLANT
SYR MEDRAD MARK V 150ML (SYRINGE) ×1 IMPLANT
TRANSDUCER W/STOPCOCK (MISCELLANEOUS) ×2 IMPLANT
TUBE CONN 8.8X1320 FR HP M-F (CONNECTOR) ×1 IMPLANT
TUBING ART PRESS 72  MALE/FEM (TUBING) ×2
TUBING ART PRESS 72 MALE/FEM (TUBING) IMPLANT
TUBING CIL FLEX 10 FLL-RA (TUBING) ×1 IMPLANT
VALVE 26 ULTRA SAPIEN KIT (Valve) ×1 IMPLANT
WIRE AMPLATZ SS-J .035X180CM (WIRE) ×1 IMPLANT
WIRE EMERALD 3MM-J .035X150CM (WIRE) ×1 IMPLANT
WIRE EMERALD 3MM-J .035X260CM (WIRE) ×1 IMPLANT
WIRE EMERALD ST .035X260CM (WIRE) ×1 IMPLANT

## 2019-12-04 NOTE — Interval H&P Note (Signed)
History and Physical Interval Note:  12/04/2019 8:01 AM  [name]  has presented today for surgery, with the diagnosis of Severe Aortic Stenosis.  The various methods of treatment have been discussed with the patient and family. After consideration of risks, benefits and other options for treatment, the patient has consented to  Procedure(s): TRANSCATHETER AORTIC VALVE REPLACEMENT, TRANSFEMORAL (N/A) TRANSESOPHAGEAL ECHOCARDIOGRAM (TEE) (N/A) as a surgical intervention.  The patient's history has been reviewed, patient examined, no change in status, stable for surgery.  I have reviewed the patient's chart and labs.  Questions were answered to the patient's satisfaction.     [name]

## 2019-12-04 NOTE — Transfer of Care (Signed)
Immediate Anesthesia Transfer of Care Note  Patient: [name]  Procedure(s) Performed: TRANSCATHETER AORTIC VALVE REPLACEMENT, TRANSFEMORAL (N/A ) TRANSESOPHAGEAL ECHOCARDIOGRAM (TEE) (N/A )  Patient Location: Cath Lab  Anesthesia Type:MAC  Level of Consciousness: drowsy  Airway & Oxygen Therapy: Patient Spontanous Breathing and Patient connected to face mask oxygen  Post-op Assessment: Report given to RN and Post -op Vital signs reviewed and stable  Post vital signs: Reviewed and stable  Last Vitals:  Vitals Value Taken Time  BP 127/53 12/04/19 1151  Temp    Pulse 48 12/04/19 1153  Resp 21 12/04/19 1153  SpO2 100 % 12/04/19 1153  Vitals shown include unvalidated device data.  Last Pain:  Vitals:   12/04/19 0758  TempSrc:   PainSc: 0-No pain         Complications: No complications documented.

## 2019-12-04 NOTE — Anesthesia Procedure Notes (Signed)
Arterial Line Insertion Start/End11/23/2021 8:50 AM Performed by: Candis Shine, CRNA, CRNA  Patient location: Pre-op. Preanesthetic checklist: patient identified, IV checked, site marked, risks and benefits discussed, surgical consent, monitors and equipment checked, pre-op evaluation, timeout performed and anesthesia consent Lidocaine 1% used for infiltration Left, radial was placed Catheter size: 20 G Hand hygiene performed  and maximum sterile barriers used   Attempts: 1 Procedure performed without using ultrasound guided technique. Following insertion, dressing applied and Biopatch. Post procedure assessment: normal and unchanged  Patient tolerated the procedure well with no immediate complications.

## 2019-12-04 NOTE — Anesthesia Procedure Notes (Signed)
Procedure Name: MAC Date/Time: 12/04/2019 9:54 AM Performed by: Candis Shine, CRNA Pre-anesthesia Checklist: Patient identified, Emergency Drugs available, Suction available, Patient being monitored and Timeout performed Patient Re-evaluated:Patient Re-evaluated prior to induction Oxygen Delivery Method: Simple face mask Dental Injury: Teeth and Oropharynx as per pre-operative assessment

## 2019-12-04 NOTE — Anesthesia Postprocedure Evaluation (Signed)
Anesthesia Post Note  Patient: [name]  Procedure(s) Performed: TRANSCATHETER AORTIC VALVE REPLACEMENT, TRANSFEMORAL (N/A ) TRANSESOPHAGEAL ECHOCARDIOGRAM (TEE) (N/A )     Patient location during evaluation: PACU Anesthesia Type: MAC Level of consciousness: awake and alert Pain management: pain level controlled Vital Signs Assessment: post-procedure vital signs reviewed and stable Respiratory status: spontaneous breathing, nonlabored ventilation, respiratory function stable and patient connected to nasal cannula oxygen Cardiovascular status: stable and blood pressure returned to baseline Postop Assessment: no apparent nausea or vomiting Anesthetic complications: no   No complications documented.  Last Vitals:  Vitals:   12/04/19 1350 12/04/19 1400  BP: (!) 120/58 (!) 113/57  Pulse: (!) 49 (!) 49  Resp: 17 17  Temp: (!) 36.3 C (!) 36.3 C  SpO2: 96% 96%    Last Pain:  Vitals:   12/04/19 1400  TempSrc: Oral  PainSc:                  Shirleymae Hauth DAVID

## 2019-12-04 NOTE — Progress Notes (Signed)
  Echocardiogram 2D Echocardiogram has been performed.  Jennette Dubin 12/04/2019, 11:26 AM

## 2019-12-04 NOTE — CV Procedure (Signed)
[facility]  TAVR OPERATIVE NOTE   Date of Procedure:  12/04/2019  Preoperative Diagnosis: Severe Aortic Stenosis   Postoperative Diagnosis: Same   Procedure:    Transcatheter Aortic Valve Replacement - Transfemoral Approach  Edwards Sapien 3 THV (size 26 mm, model #[id number], serial # [id number])   Co-Surgeons:  [name], MD and [name], MD   Anesthesiologist:  Conrad Leadore  Echocardiographer:  Croitoru  Pre-operative Echo Findings:  Severe aortic stenosis  Normal left ventricular systolic function  Post-operative Echo Findings:  No paravalvular leak  Normal left ventricular systolic function  BRIEF CLINICAL NOTE AND INDICATIONS FOR SURGERY  79 yo male with history of CAD, HTN, GERD, hyperlipidemia, sleep apnea, DM on insulin and severe aortic stenosis who is here today for TAVR. He is known to have CAD with a bare metal stent placed in the RCA in 2006. Cardiac cath 11/02/19 with multi-vessel CAD. He was not felt to be a good candidate for CABG. PCI of the LAD performed on 11/22/19 with stent placement after atherectomy. He has been followed for moderate aortic stenosis. Echocardiogram 10/31/19 with LVEF=60-65%, mild LVH. The aortic valve leaflets are thickened and calcified with limited leaflet excursion. Mean gradient 23.2 mmHg, peak gradient 41.2 mmHg, AVA 0.98 cm2, dimensionless index 0.28. His echo parameters suggest moderate to severe AS. He has had recent worsened dyspnea on exertion and progressive fatigue. He has had no chest pain. He has been seen by Dr. Cyndia Bent with CT surgery. Pre-TAVR CT scans completed and show adequate anatomy for transfemoral access and placement of a 26 mm Edwards Sapien 3 THV.   During the course of the patient's preoperative work up they have been evaluated comprehensively by a multidisciplinary team of specialists coordinated through the [facility] in the [facility].  They have been demonstrated to suffer from symptomatic severe aortic stenosis as noted above. The patient has been counseled extensively as to the relative risks and benefits of all options for the treatment of severe aortic stenosis including long term medical therapy, conventional surgery for aortic valve replacement, and transcatheter aortic valve replacement.  The patient has been independently evaluated by Dr. Cyndia Bent with CT surgery and they are felt to be at high risk for conventional surgical aortic valve replacement. The surgeon indicated the patient would be a poor candidate for conventional surgery. Based upon review of all of the patient's preoperative diagnostic tests they are felt to be candidate for transcatheter aortic valve replacement using the transfemoral approach as an alternative to high risk conventional surgery.    Following the decision to proceed with transcatheter aortic valve replacement, a discussion has been held regarding what types of management strategies would be attempted intraoperatively in the event of life-threatening complications, including whether or not the patient would be considered a candidate for the use of cardiopulmonary bypass and/or conversion to open sternotomy for attempted surgical intervention.  The patient has been advised of a variety of complications that might develop peculiar to this approach including but not limited to risks of death, stroke, paravalvular leak, aortic dissection or other major vascular complications, aortic annulus rupture, device embolization, cardiac rupture or perforation, acute myocardial infarction, arrhythmia, heart block or bradycardia requiring permanent pacemaker placement, congestive heart failure, respiratory failure, renal failure, pneumonia, infection, other late complications related to structural valve deterioration or migration, or other complications that might ultimately cause a temporary or permanent loss of  functional independence or other long term  morbidity.  The patient provides full informed consent for the procedure as described and all questions were answered preoperatively.    DETAILS OF THE OPERATIVE PROCEDURE  PREPARATION:   The patient is brought to the operating room on the above mentioned date and central monitoring was established by the anesthesia team including placement of a radial arterial line. The patient is placed in the supine position on the operating table.  Intravenous antibiotics are administered. Conscious sedation is used.   Baseline transthoracic echocardiogram was performed. The patient's chest, abdomen, both groins, and both lower extremities are prepared and draped in a sterile manner. A time out procedure is performed.   PERIPHERAL ACCESS:   Using the modified Seldinger technique, femoral arterial and venous access were obtained with placement of 6 Fr sheaths on the left side using u/s guidance.  A pigtail diagnostic catheter was passed through the femoral arterial sheath under fluoroscopic guidance into the aortic root.  A temporary transvenous pacemaker catheter was passed through the femoral venous sheath under fluoroscopic guidance into the right ventricle.  The pacemaker was tested to ensure stable lead placement and pacemaker capture. Aortic root angiography was performed in order to determine the optimal angiographic angle for valve deployment.  TRANSFEMORAL ACCESS:  A micropuncture kit was used to gain access to the right femoral artery using u/s guidance. Position confirmed with angiography. Pre-closure with double ProGlide closure devices. The patient was heparinized systemically and ACT verified > 250 seconds.    A 14 Fr transfemoral E-sheath was introduced into the right femoral artery after progressively dilating over an Amplatz superstiff wire. An AL-2 catheter was used to direct a straight-tip exchange length wire across the native aortic valve into the  left ventricle. This was exchanged out for a pigtail catheter and position was confirmed in the LV apex. Simultaneous LV and Ao pressures were recorded.  The pigtail catheter was then exchanged for an Amplatz Extra-stiff wire in the LV apex.   TRANSCATHETER HEART VALVE DEPLOYMENT:  An Edwards Sapien 3 THV (size 26 mm) was prepared and crimped per manufacturer's guidelines, and the proper orientation of the valve is confirmed on the [employer] delivery system. The valve was advanced through the introducer sheath using normal technique until in an appropriate position in the abdominal aorta beyond the sheath tip. The balloon was then retracted and using the fine-tuning wheel was centered on the valve. The valve was then advanced across the aortic arch using appropriate flexion of the catheter. The valve was carefully positioned across the aortic valve annulus. The Commander catheter was retracted using normal technique. Once final position of the valve has been confirmed by angiographic assessment, the valve is deployed while temporarily holding ventilation and during rapid ventricular pacing to maintain systolic blood pressure < 50 mmHg and pulse pressure < 10 mmHg. The balloon inflation is held for >3 seconds after reaching full deployment volume. Once the balloon has fully deflated the balloon is retracted into the ascending aorta and valve function is assessed using TTE. There is felt to be no paravalvular leak and no central aortic insufficiency.  The patient's hemodynamic recovery following valve deployment is good.  The deployment balloon and guidewire are both removed. Echo demostrated acceptable post-procedural gradients, stable mitral valve function, and no AI.   PROCEDURE COMPLETION:  The sheath was then removed and closure devices were completed. Protamine was administered once femoral arterial repair was complete. The temporary pacemaker, pigtail catheters and femoral sheaths were removed  with a Mynx  closure device placed in the left femoral artery and manual pressure used for venous hemostasis.   The patient tolerated the procedure well and is transported to the surgical intensive care in stable condition. There were no immediate intraoperative complications. All sponge instrument and needle counts are verified correct at completion of the operation.   No blood products were administered during the operation.  The patient received a total of 80 mL of intravenous contrast during the procedure.  [name] MD 12/04/2019 11:39 AM

## 2019-12-04 NOTE — Discharge Instructions (Signed)
ACTIVITY AND EXERCISE °• Daily activity and exercise are an important part of your recovery. People recover at different rates depending on their general health and type of valve procedure. °• Most people recovering from TAVR feel better relatively quickly  °• No lifting, pushing, pulling more than 10 pounds (examples to avoid: groceries, vacuuming, gardening, golfing): °            - For one week with a procedure through the groin. °            - For six weeks for procedures through the chest wall or neck. °NOTE: You will typically see one of our providers 7-14 days after your procedure to discuss WHEN TO RESUME the above activities.  °  °  °DRIVING °• Do not drive until you are seen for follow up and cleared by a provider. Generally, we ask patient to not drive for 1 week after their procedure. °• If you have been told by your doctor in the past that you may not drive, you must talk with him/her before you begin driving again. °  °DRESSING °• Groin site: you may leave the clear dressing over the site for up to one week or until it falls off. °  °HYGIENE °• If you had a femoral (leg) procedure, you may take a shower when you return home. After the shower, pat the site dry. Do NOT use powder, oils or lotions in your groin area until the site has completely healed. °• If you had a chest procedure, you may shower when you return home unless specifically instructed not to by your discharging practitioner. °            - DO NOT scrub incision; pat dry with a towel. °            - DO NOT apply any lotions, oils, powders to the incision. °            - No tub baths / swimming for at least 2 weeks. °• If you notice any fevers, chills, increased pain, swelling, bleeding or pus, please contact your doctor. °  °ADDITIONAL INFORMATION °• If you are going to have an upcoming dental procedure, please contact our office as you will require antibiotics ahead of time to prevent infection on your heart valve.  ° ° °If you have any  questions or concerns you can call the structural heart phone during normal business hours 8am-4pm. If you have an urgent need after hours or weekends please call [phone] to talk to the on call provider for general cardiology. If you have an emergency that requires immediate attention, please call 911.  ° ° °After TAVR Checklist ° °Check  Test Description  ° Follow up appointment in 1-2 weeks  You will see our structural heart physician assistant, Katie Monti Villers. Your incision sites will be checked and you will be cleared to drive and resume all normal activities if you are doing well.    ° 1 month echo and follow up  You will have an echo to check on your new heart valve and be seen back in the office by Katie Noelia Lenart. Many times the echo is not read by your appointment time, but Katie will call you later that day or the following day to report your results.  ° Follow up with your primary cardiologist You will need to be seen by your primary cardiologist in the following 3-6 months after your 1 month appointment in the valve   clinic. Often times your Plavix or Aspirin will be discontinued during this time, but this is decided on a case by case basis.   ° 1 year echo and follow up You will have another echo to check on your heart valve after 1 year and be seen back in the office by Katie Lauranne Beyersdorf. This your last structural heart visit.  ° Bacterial endocarditis prophylaxis  You will have to take antibiotics for the rest of your life before all dental procedures (even teeth cleanings) to protect your heart valve. Antibiotics are also required before some surgeries. Please check with your cardiologist before scheduling any surgeries. Also, please make sure to tell us if you have a penicillin allergy as you will require an alternative antibiotic.   ° ° °

## 2019-12-04 NOTE — Interval H&P Note (Signed)
History and Physical Interval Note:  12/04/2019 9:44 AM  [name]  has presented today for surgery, with the diagnosis of Severe Aortic Stenosis.  The various methods of treatment have been discussed with the patient and family. After consideration of risks, benefits and other options for treatment, the patient has consented to  Procedure(s): TRANSCATHETER AORTIC VALVE REPLACEMENT, TRANSFEMORAL (N/A) TRANSESOPHAGEAL ECHOCARDIOGRAM (TEE) (N/A) as a surgical intervention.  The patient's history has been reviewed, patient examined, no change in status, stable for surgery.  I have reviewed the patient's chart and labs.  Questions were answered to the patient's satisfaction.     [name]

## 2019-12-04 NOTE — Op Note (Signed)
[facility]   MULTIDISCIPLINARY HEART VALVE TEAM   TAVR OPERATIVE NOTE   Date of Procedure:  12/04/2019  Preoperative Diagnosis: Severe Aortic Stenosis   Postoperative Diagnosis: Same   Procedure:    Transcatheter Aortic Valve Replacement - Percutaneous Right Transfemoral Approach  Edwards Sapien 3 Ultra THV (size 26 mm, model # 9750TFX, serial # [id number])   Co-Surgeons:  [name], MD and [name], MD   Anesthesiologist:  Irene Limbo, MD  Echocardiographer:  Jerilynn Mages. Croitoru, MD  Pre-operative Echo Findings:  Severe aortic stenosis  Normal left ventricular systolic function  Post-operative Echo Findings:  No paravalvular leak  Normal left ventricular systolic function   BRIEF CLINICAL NOTE AND INDICATIONS FOR SURGERY  This 79 year old gentleman has a 6 to 31-month history of progressive exertional shortness of breath and fatigue which is worsened recently and is markedly limiting his ability to be active. He has had no chest pain or pressure. He has had some swelling in his feet. He reports some orthopnea. He has been followed for moderate aortic stenosis and had an echocardiogram on 10/31/19 showing a mean gradient across the aortic valve of 23.2 mmHg and an aortic valve area of 0.98 cm. Dimensionless index of 0.28. Cardiac catheterization on 11/14/2019 showed severe multivessel coronary disease. The LAD had a 99% mid vessel stenosis and a second 90% stenosis further downstream to the takeoff of a second diagonal branch that were successfully treated with PCI/DES. The left circumflex has a large third marginal branch that had 80% stenosis in the lateral subbranch. The right coronary artery is smaller and nondominant with 70% and 90% stenoses. The mean gradient across aortic valve was 26.6 mmHg the peak to peak gradient of 31 mmHg. Aortic valve area was 1.06 cm. He underwent CT scan work-up for consideration of TAVR. His gated cardiac CTA shows a  trileaflet aortic valve with calcified leaflets and severely restricted mobility. Annular measurement was suitable for a 26 mm Edwards SAPIEN 3 valve. Abdominal and pelvic CTA shows adequate pelvic vascular anatomy to allow transfemoral insertion.  His echocardiogram data suggest moderate aortic stenosis although his valve is heavily calcified and has restricted mobility and looks like a severely stenotic valve. His gated cardiac CTA confirms heavy calcification of the valve leaflets with restricted mobility and again it looks like a severely stenotic valve. He presented with New York Heart Association class III symptoms of exertional fatigue and shortness of breath with lower extremity edema consistent with chronic diastolic congestive heart failure. He has significantly improved with diuresis. I think the best option for treating this patient is going to be TAVR. His operative risk for open surgical AVR would be significantly increased due to his morbid obesity, chronic degenerative arthritis status post bilateral knee, hip, and shoulder replacements with the previous spine surgery, and other comorbidities including insulin-dependent diabetes, sleep apnea, and advanced age. I discussed all this with the patient and his wife and answered all their questions.  The patient and his wife were counseled at length regarding treatment alternatives for management of severe symptomatic aortic stenosis. The risks and benefits of surgical intervention has been discussed in detail. Long-term prognosis with medical therapy was discussed. Alternative approaches such as conventional surgical aortic valve replacement, transcatheter aortic valve replacement, and palliative medical therapy were compared and contrasted at length. This discussion was placed in the context of the patient's own specific clinical presentation and past medical history. All of their questions have been addressed.  Following the decision  to proceed with  transcatheter aortic valve replacement, a discussion was held regarding what types of management strategies would be attempted intraoperatively in the event of life-threatening complications, including whether or not the patient would be considered a candidate for the use of cardiopulmonary bypass and/or conversion to open sternotomy for attempted surgical intervention. I think he would be a candidate for emergent sternotomy to manage any intraoperative complications depending on the situation. The patient is aware of the fact that transient use of cardiopulmonary bypass may be necessary. The patient has been advised of a variety of complications that might develop including but not limited to risks of death, stroke, paravalvular leak, aortic dissection or other major vascular complications, aortic annulus rupture, device embolization, cardiac rupture or perforation, mitral regurgitation, acute myocardial infarction, arrhythmia, heart block or bradycardia requiring permanent pacemaker placement, congestive heart failure, respiratory failure, renal failure, pneumonia, infection, other late complications related to structural valve deterioration or migration, or other complications that might ultimately cause a temporary or permanent loss of functional independence or other long term morbidity. The patient provides full informed consent for the procedure as described and all questions were answered.    DETAILS OF THE OPERATIVE PROCEDURE  PREPARATION:    The patient was brought to the operating room on the above mentioned date and appropriate monitoring was established by the anesthesia team. The patient was placed in the supine position on the operating table.  Intravenous antibiotics were administered. The patient was monitored closely throughout the procedure under conscious sedation.  Baseline transthoracic echocardiogram was performed. The patient's abdomen and both groins were prepped and draped in a  sterile manner. A time out procedure was performed.   PERIPHERAL ACCESS:    Using the modified Seldinger technique, femoral arterial and venous access was obtained with placement of 6 Fr sheaths on the left side.  A pigtail diagnostic catheter was passed through the left arterial sheath under fluoroscopic guidance into the aortic root.  A temporary transvenous pacemaker catheter was passed through the left femoral venous sheath under fluoroscopic guidance into the right ventricle.  The pacemaker was tested to ensure stable lead placement and pacemaker capture. Aortic root angiography was performed in order to determine the optimal angiographic angle for valve deployment.   TRANSFEMORAL ACCESS:   Percutaneous transfemoral access and sheath placement was performed using ultrasound guidance.  The right common femoral artery was cannulated using a micropuncture needle and appropriate location was verified using hand injection angiogram.  A pair of Abbott Perclose percutaneous closure devices were placed and a 6 French sheath replaced into the femoral artery.  The patient was heparinized systemically and ACT verified > 250 seconds.    A 14 Fr transfemoral E-sheath was introduced into the right common femoral artery after progressively dilating over an Amplatz superstiff wire. An AL-1 catheter was used to direct a straight-tip exchange length wire across the native aortic valve into the left ventricle. This was exchanged out for a pigtail catheter and position was confirmed in the LV apex. Simultaneous LV and Ao pressures were recorded.  The pigtail catheter was exchanged for an Amplatz Extra-stiff wire in the LV apex.     BALLOON AORTIC VALVULOPLASTY:   Not performed   TRANSCATHETER HEART VALVE DEPLOYMENT:   An Edwards Sapien 3 Ultra transcatheter heart valve (size 26 mm) was prepared and crimped per manufacturer's guidelines, and the proper orientation of the valve is confirmed on the [employer] delivery system. The valve was advanced through the introducer  sheath using normal technique until in an appropriate position in the abdominal aorta beyond the sheath tip. The balloon was then retracted and using the fine-tuning wheel was centered on the valve. The valve was then advanced across the aortic arch using appropriate flexion of the catheter. The valve was carefully positioned across the aortic valve annulus. The Commander catheter was retracted using normal technique. Once final position of the valve has been confirmed by angiographic assessment, the valve is deployed while temporarily holding ventilation and during rapid ventricular pacing to maintain systolic blood pressure < 50 mmHg and pulse pressure < 10 mmHg. The balloon inflation is held for >3 seconds after reaching full deployment volume. Once the balloon has fully deflated the balloon is retracted into the ascending aorta and valve function is assessed using echocardiography. There is felt to be no paravalvular leak and no central aortic insufficiency.  The patient's hemodynamic recovery following valve deployment is good.  The deployment balloon and guidewire are both removed.    PROCEDURE COMPLETION:   The sheath was removed and femoral artery closure performed.  Protamine was administered once femoral arterial repair was complete. The temporary pacemaker, pigtail catheters and femoral sheaths were removed with manual pressure used for hemostasis.  A Mynx femoral closure device was utilized following removal of the diagnostic sheath in the left femoral artery.  The patient tolerated the procedure well and is transported to the cath lab recovery area in stable condition. There were no immediate intraoperative complications. All sponge instrument and needle counts are verified correct at completion of the operation.   No blood products were administered during the operation.  The patient received a total of 80 mL of  intravenous contrast during the procedure.   [name], MD 12/04/2019

## 2019-12-04 NOTE — Progress Notes (Signed)
Pt arrived from unit from Cath LAb. Temp 97.3 oral, BP 120/58 HR49 Sp02 96 RA RR 17. Bilateral groin sites level 0 Will continue to monitor. Pt. Oriented to unit   Phoebe Sharps, RN

## 2019-12-05 ENCOUNTER — Inpatient Hospital Stay (HOSPITAL_COMMUNITY): Payer: Medicare Other

## 2019-12-05 DIAGNOSIS — Z952 Presence of prosthetic heart valve: Secondary | ICD-10-CM

## 2019-12-05 DIAGNOSIS — I350 Nonrheumatic aortic (valve) stenosis: Principal | ICD-10-CM

## 2019-12-05 LAB — GLUCOSE, CAPILLARY
Glucose-Capillary: 264 mg/dL — ABNORMAL HIGH (ref 70–99)
Glucose-Capillary: 223 mg/dL — ABNORMAL HIGH (ref 70–99)
Glucose-Capillary: 223 mg/dL — ABNORMAL HIGH (ref 70–99)
Glucose-Capillary: 224 mg/dL — ABNORMAL HIGH (ref 70–99)

## 2019-12-05 LAB — CBC
HCT: 40 % (ref 39.0–52.0)
Hemoglobin: 12.7 g/dL — ABNORMAL LOW (ref 13.0–17.0)
MCH: 27.7 pg (ref 26.0–34.0)
MCHC: 31.8 g/dL (ref 30.0–36.0)
MCV: 87.1 fL (ref 80.0–100.0)
Platelets: 273 10*3/uL (ref 150–400)
RBC: 4.59 MIL/uL (ref 4.22–5.81)
RDW: 15 % (ref 11.5–15.5)
WBC: 8.1 10*3/uL (ref 4.0–10.5)
nRBC: 0 % (ref 0.0–0.2)

## 2019-12-05 LAB — BASIC METABOLIC PANEL
Anion gap: 11 (ref 5–15)
BUN: 16 mg/dL (ref 8–23)
CO2: 25 mmol/L (ref 22–32)
Calcium: 8.5 mg/dL — ABNORMAL LOW (ref 8.9–10.3)
Chloride: 99 mmol/L (ref 98–111)
Creatinine, Ser: 1.27 mg/dL — ABNORMAL HIGH (ref 0.61–1.24)
GFR, Estimated: 57 mL/min — ABNORMAL LOW (ref >60)
Glucose, Bld: 230 mg/dL — ABNORMAL HIGH (ref 70–99)
Potassium: 3.8 mmol/L (ref 3.5–5.1)
Sodium: 135 mmol/L (ref 135–145)

## 2019-12-05 LAB — MAGNESIUM: Magnesium: 1.7 mg/dL (ref 1.7–2.4)

## 2019-12-05 NOTE — Consult Note (Signed)
   [facility] Inpatient Consult   12/05/2019  LUIAN SCHUMPERT 1940-05-10 [id number]  [place] [facility] [ACO] Patient: [employer]  Patient was assessed for [facility] for [employer]. Patient with less than 30 days readmission.  Patient was previously active with [facility].  Met with patient at bedside regarding being restarted with [facility] services. Patient in agreement for post hospital follow up.  Of note, [facility] Care Management services does not replace or interfere with any services that are arranged by inpatient [facility] care management team.   For additional questions or referrals please contact:  Natividad Brood, RN BSN [facility] Liaison  [phone] business mobile phone Toll free office [phone]  Fax number: [phone] [email or web address]

## 2019-12-05 NOTE — Progress Notes (Signed)
CARDIAC REHAB PHASE I   PRE:  Rate/Rhythm: 67 SR  BP:  Supine: 145/55  Sitting:   Standing:    SaO2: 95%RA  MODE:  Ambulation: 370 ft   POST:  Rate/Rhythm: 78 SR  BP:  Supine: 190/62  159/57  Sitting:   Standing:    SaO2: 97%RA 0900-0935 Have seen pt on last two admissions. Pt walked 370 ft on RA with rolling walker, gait belt and minimal asst. Pt has walker at home if needed. Back to bed after walk. Encouraged walking as tolerated at home. Encouraged to watch sodium. Referred to [place] [facility] again so that they will be aware that pt has had TAVR.  Wife in room.    Graylon Good, RN BSN  12/05/2019 9:31 AM

## 2019-12-05 NOTE — Progress Notes (Signed)
Progress Note  Patient Name: [name] Date of Encounter: 12/05/2019  [facility] Cardiologist: Rozann Lesches, MD   Subjective   No events overnight. He feels great today. No dyspnea or chest pain.   Inpatient Medications    Scheduled Meds: . acetaminophen  1,000 mg Oral BID  . amLODipine  10 mg Oral Daily  . aspirin EC  81 mg Oral QHS  . atorvastatin  40 mg Oral QPM  . clopidogrel  75 mg Oral Daily  . donepezil  5 mg Oral QHS  . famotidine  20 mg Oral BID WC  . furosemide  60 mg Oral Daily  . insulin aspart  15 Units Subcutaneous TID WC  . insulin glargine  56 Units Subcutaneous QHS  . isosorbide mononitrate  30 mg Oral QHS  . loratadine  10 mg Oral Daily  . PARoxetine  20 mg Oral Daily  . potassium chloride SA  20 mEq Oral Daily  . sodium chloride flush  3 mL Intravenous Q12H  . tamsulosin  0.4 mg Oral Daily   Continuous Infusions: . sodium chloride    . cefUROXime (ZINACEF)  IV Stopped (12/05/19 0353)  . nitroGLYCERIN    . phenylephrine (NEO-SYNEPHRINE) Adult infusion     PRN Meds: sodium chloride, acetaminophen **OR** acetaminophen, metoprolol tartrate, nitroGLYCERIN, ondansetron (ZOFRAN) IV, sodium chloride flush, traMADol   Vital Signs    Vitals:   12/04/19 1900 12/04/19 1950 12/05/19 0020 12/05/19 0320  BP: (!) 152/58 (!) 153/58 (!) 113/56 (!) 129/59  Pulse: (!) 57 (!) 58 73 69  Resp: 18 17 19 20   Temp:  (!) 97.4 F (36.3 C) 99.8 F (37.7 C) 98.1 F (36.7 C)  TempSrc:  Oral Axillary Oral  SpO2: 96% 99% 94% 96%  Weight:    105.9 kg  Height:        Intake/Output Summary (Last 24 hours) at 12/05/2019 0724 Last data filed at 12/05/2019 0500 Gross per 24 hour  Intake 2655.12 ml  Output 2150 ml  Net 505.12 ml   Last 3 Weights 12/05/2019 12/04/2019 11/30/2019  Weight (lbs) 233 lb 6.4 oz 230 lb 236 lb 9.6 oz  Weight (kg) 105.87 kg 104.327 kg 107.321 kg      Telemetry    Sinus - Personally Reviewed  ECG    Sinus, inferior and  lateral T wave inversion, unchanged - Personally Reviewed  Physical Exam   GEN: No acute distress.   Neck: No JVD Cardiac: RRR, soft systolic murmur.   Respiratory: Clear to auscultation bilaterally. GI: Soft, nontender, non-distended  MS: No edema; No deformity. No hematoma bilateral groins Neuro:  Nonfocal  Psych: Normal affect   Labs    High Sensitivity Troponin:  No results for input(s): TROPONINIHS in the last 720 hours.    Chemistry Recent Labs  Lab 11/30/19 1410 11/30/19 1410 12/04/19 1004 12/04/19 1204 12/05/19 0040  NA 138   < > 140 138 135  K 4.1   < > 3.9 4.8 3.8  CL 101   < > 101 100 99  CO2 22  --   --   --  25  GLUCOSE 151*   < > 222* 261* 230*  BUN 21   < > 17 20 16   CREATININE 1.18   < > 0.90 1.00 1.27*  CALCIUM 9.5  --   --   --  8.5*  PROT 7.1  --   --   --   --   ALBUMIN 3.5  --   --   --   --  AST 26  --   --   --   --   ALT 26  --   --   --   --   ALKPHOS 74  --   --   --   --   BILITOT 0.7  --   --   --   --   GFRNONAA >60  --   --   --  57*  ANIONGAP 15  --   --   --  11   < > = values in this interval not displayed.     Hematology Recent Labs  Lab 11/30/19 1410 11/30/19 1410 12/04/19 1004 12/04/19 1204 12/05/19 0040  WBC 8.0  --   --   --  8.1  RBC 4.85  --   --   --  4.59  HGB 13.4   < > 12.9* 12.2* 12.7*  HCT 42.7   < > 38.0* 36.0* 40.0  MCV 88.0  --   --   --  87.1  MCH 27.6  --   --   --  27.7  MCHC 31.4  --   --   --  31.8  RDW 15.0  --   --   --  15.0  PLT 318  --   --   --  273   < > = values in this interval not displayed.    BNP Recent Labs  Lab 11/30/19 1411  BNP 23.4     DDimer No results for input(s): DDIMER in the last 168 hours.   Radiology    DG Chest Port 1 View  Result Date: 12/04/2019 CLINICAL DATA:  Aortic stenosis. EXAM: PORTABLE CHEST 1 VIEW COMPARISON:  11/30/2019 FINDINGS: 1358 hours. Low volumes. The cardio pericardial silhouette is enlarged. The lungs are clear without focal pneumonia,  edema, pneumothorax or pleural effusion. The visualized bony structures of the thorax show no acute abnormality. Telemetry leads overlie the chest. IMPRESSION: Low volume film without acute cardiopulmonary findings. Electronically Signed   By: Misty Stanley M.D.   On: 12/04/2019 14:29   ECHOCARDIOGRAM LIMITED  Result Date: 12/04/2019    ECHOCARDIOGRAM LIMITED REPORT   Patient Name:   [name] Date of Exam: 12/04/2019 Medical Rec #:  [id number]          Height:       66.0 in Accession #:    [id number]         Weight:       230.0 lb Date of Birth:  Jun 09, 1940           BSA:          2.122 m Patient Age:    79 years           BP:           161/69 mmHg Patient Gender: M                  HR:           55 bpm. Exam Location:  Inpatient Procedure: Echo Assisted Procedure, Limited Echo, Limited Color Doppler and            Cardiac Doppler Indications:     Aortic Stenosis I35.0  History:         Patient has prior history of Echocardiogram examinations, most                  recent 10/31/2019. Previous Myocardial Infarction; Risk  Factors:Diabetes and Dyslipidemia.  Sonographer:     Mikki Santee RDCS (AE) Referring Phys:  [place] Diagnosing Phys: Sanda Klein MD                   PRE-PROCEDURAL FINDINGS:                   Normal left ventricular systolic function. Estimated LVEF                  60-65%.                  There are no regional wall motion abnormalities.                  Severe calcific aortic stenosis. Trileaflet aortic valve.                  Peak aortic valve gradient 37 mm Hg, mean gradient 21 mm Hg                  (likely underestimated due to difficulty aligning the Doppler                  beam with patient in supine position).                  Dimensionless obstructive index 0.20, calculated aortic valve                  area is 1.1 cm.                  No aortic insufficiency.                  Trivial mitral insufficiency.                  No  pericardial effusion.                   POST-PROCEDURAL FINDINGS:                   Hyperdynamic left ventricular systolic function. Estimated LVEF                  60-65%.                  There are no regional wall motion abnormalities.                  Well-seated TAVR stent-valve.                  Peak aortic valve gradient 10 mm Hg, mean gradient 6 mm Hg.                  Dimensionless obstructive index 0.45, calculated aortic valve                  area is 2.55 cm.                  There is no aortic insufficiency and no perivalvular leak.                  No mitral insufficiency.                  No pericardial effusion. IMPRESSIONS  1. Left ventricular ejection fraction, by estimation, is 60 to 65%. The left ventricle has normal function. The left ventricle has no regional wall motion abnormalities. There is mild concentric left ventricular  hypertrophy.  2. Right ventricular systolic function is normal. The right ventricular size is normal.  3. The mitral valve is normal in structure. Trivial mitral valve regurgitation.  4. The aortic valve has been repaired/replaced. Aortic valve regurgitation is not visualized. Procedure Date: 12/04/2019. Aortic valve mean gradient measures 6.0 mmHg. Aortic valve Vmax measures 1.61 m/s.  5. There is borderline dilatation of the aortic root, measuring 40 mm. FINDINGS  Left Ventricle: Left ventricular ejection fraction, by estimation, is 60 to 65%. The left ventricle has normal function. The left ventricle has no regional wall motion abnormalities. The left ventricular internal cavity size was normal in size. There is  mild concentric left ventricular hypertrophy. Right Ventricle: The right ventricular size is normal. Right ventricular systolic function is normal. Pericardium: There is no evidence of pericardial effusion. Mitral Valve: The mitral valve is normal in structure. Trivial mitral valve regurgitation. Aortic Valve: The aortic valve has been repaired/replaced.  Aortic valve regurgitation is not visualized. Aortic valve mean gradient measures 6.0 mmHg. Aortic valve peak gradient measures 10.4 mmHg. Aortic valve area, by VTI measures 2.55 cm. There is a 26 mm Sapien prosthetic, stented (TAVR) valve present in the aortic position. Aorta: There is borderline dilatation of the aortic root, measuring 40 mm. LEFT VENTRICLE PLAX 2D LVOT diam:     2.70 cm LV SV:         102 LV SV Index:   48 LVOT Area:     5.73 cm  AORTIC VALVE AV Area (Vmax):    2.36 cm AV Area (Vmean):   2.59 cm AV Area (VTI):     2.55 cm AV Vmax:           161.00 cm/s AV Vmean:          111.000 cm/s AV VTI:            0.399 m AV Peak Grad:      10.4 mmHg AV Mean Grad:      6.0 mmHg LVOT Vmax:         66.50 cm/s LVOT Vmean:        50.200 cm/s LVOT VTI:          0.178 m LVOT/AV VTI ratio: 0.45  SHUNTS Systemic VTI:  0.18 m Systemic Diam: 2.70 cm Sanda Klein MD Electronically signed by Sanda Klein MD Signature Date/Time: 12/04/2019/4:15:55 PM    Final    Structural Heart Procedure  Result Date: 12/04/2019 See surgical note for result.   Cardiac Studies     Patient Profile     79 y.o. male with CAD, severe AS s/p TAVR 12/04/19  Assessment & Plan    1. Severe aortic stenosis: He is doing well one day post TAVR from the right transfemoral approach with placement of a 26 mm Edwards Sapien 3 THV. Groins stable. No hematoma. Will continue ASA and Plavix. Echo this am to assess valve. If ok, will d/c home today.   2. CAD s/p recent PCI/stenting of the LAD. Continue ASA, Plavix and statin.   3. HTN: BP overall well controlled on home therapy.   If echo ok this am, will d/c home today. Follow up has already been arranged with Nell Range, PA-C.   For questions or updates, please contact [place] Please consult [email or web address] for contact info under        Signed, [name], MD  12/05/2019, 7:24 AM

## 2019-12-05 NOTE — Progress Notes (Signed)
Mobility Specialist: Progress Note   12/05/19 1430  Mobility  Activity Ambulated in hall  Level of Assistance Contact guard assist, steadying assist  Assistive Device Front wheel walker  Distance Ambulated (ft) 410 ft  Mobility Response Tolerated well  Mobility performed by Mobility specialist  $Mobility charge 1 Mobility   Post-Mobility: 80 HR, 95% SpO2  Pt experienced LOB a couple times while walking to and standing at sink in room before going into the hallway. Pt ambulated at fast pace and needed frequent cues to slow down. Pt eager for discharge.   [facility] Nazariah Cadet Mobility Specialist

## 2019-12-05 NOTE — Discharge Summary (Addendum)
Discharge Summary    Patient ID: CREWE HEATHMAN MRN: [id number]; DOB: [birth year]  Admit date: 12/04/2019 Discharge date: 12/05/2019  Primary Care Provider: Dettinger, Fransisca Kaufmann, MD  Primary Cardiologist: Rozann Lesches, MD  Primary Electrophysiologist:  None   Discharge Diagnoses    Active Problems:   Severe aortic stenosis    Diagnostic Studies/Procedures    TAVR: 12/04/19  Procedure:        Transcatheter Aortic Valve Replacement - Transfemoral Approach             Edwards Sapien 3 THV (size 26 mm, model #[id number], serial # [id number])              Co-Surgeons:                        [name], MD and [name], MD    Anesthesiologist:                  Conrad [place]   Echocardiographer:              Cro[place]   Pre-operative Echo Findings: Severe aortic stenosis Normal left ventricular systolic function   Post-operative Echo Findings: No paravalvular leak Normal left ventricular systolic function   BRIEF CLINICAL NOTE AND INDICATIONS FOR SURGERY _____________   History of Present Illness     [name] is a 79 y.o. male with 6 to 46-month history of progressive exertional shortness of breath and fatigue which was worsened recently and was markedly limiting his ability to be active. He had no chest pain or pressure. He has had some swelling in his feet. He reported some orthopnea. He has been followed for moderate aortic stenosis and had an echocardiogram on 10/31/19 showing a mean gradient across the aortic valve of 23.2 mmHg and an aortic valve area of 0.98 cm. Dimensionless index of 0.28. Cardiac catheterization on 11/14/2019 showed severe multivessel coronary disease. The LAD had a 99% mid vessel stenosis and a second 90% stenosis further downstream to the takeoff of a second diagonal branch that were successfully treated with PCI/DES. The left circumflex has a large third marginal branch that had 80% stenosis in the lateral subbranch. The right  coronary artery is smaller and nondominant with 70% and 90% stenoses. The mean gradient across aortic valve was 26.6 mmHg the peak to peak gradient of 31 mmHg. Aortic valve area was 1.06 cm. He underwent CT scan work-up for consideration of TAVR. His gated cardiac CTA showed a trileaflet aortic valve with calcified leaflets and severely restricted mobility. Annular measurement was suitable for a 26 mm Edwards SAPIEN 3 valve. He was seen in the structural heart clinic and deemed a candidate for TAVR.   Hospital Course      1. Severe aortic stenosis: underwent TAVR from the right transfemoral approach with placement of a 26 mm Edwards Sapien 3 THV with Dr. Angelena Form and Dr. Cyndia Bent. No complications noted post op. Bilateral groins stable without hematoma.  -- plan to continue on ASA and Plavix. Follow up echo prior to discharge was reviewed by Dr. Angelena Form and stable.    2. CAD s/p recent PCI/stenting of the LAD: Doing well without anginal symptoms.  -- Continue ASA, Plavix and statin.    3. HTN: BP overall well controlled on home therapy. No changes made this admission.   _____________  Discharge Vitals Blood pressure (!) 143/58, pulse 62, temperature 98.2 F (36.8 C), temperature source Oral, resp. rate 10, height 5'  6" (1.676 m), weight 105.9 kg, SpO2 96 %.  Filed Weights   12/04/19 0703 12/05/19 0320  Weight: 104.3 kg 105.9 kg    Labs & Radiologic Studies    CBC Recent Labs    12/04/19 1204 12/05/19 0040  WBC  --  8.1  HGB 12.2* 12.7*  HCT 36.0* 40.0  MCV  --  87.1  PLT  --  330   Basic Metabolic Panel Recent Labs    12/04/19 1204 12/05/19 0040  NA 138 135  K 4.8 3.8  CL 100 99  CO2  --  25  GLUCOSE 261* 230*  BUN 20 16  CREATININE 1.00 1.27*  CALCIUM  --  8.5*  MG  --  1.7   Liver Function Tests No results for input(s): AST, ALT, ALKPHOS, BILITOT, PROT, ALBUMIN in the last 72 hours. No results for input(s): LIPASE, AMYLASE in the last 72 hours. High Sensitivity  Troponin:   No results for input(s): TROPONINIHS in the last 720 hours.  BNP Invalid input(s): POCBNP D-Dimer No results for input(s): DDIMER in the last 72 hours. Hemoglobin A1C No results for input(s): HGBA1C in the last 72 hours. Fasting Lipid Panel No results for input(s): CHOL, HDL, LDLCALC, TRIG, CHOLHDL, LDLDIRECT in the last 72 hours. Thyroid Function Tests No results for input(s): TSH, T4TOTAL, T3FREE, THYROIDAB in the last 72 hours.  Invalid input(s): FREET3 _____________  DG Chest 2 View  Result Date: 12/03/2019 CLINICAL DATA:  Preop respiratory exam EXAM: CHEST - 2 VIEW COMPARISON:  12/18/2017 FINDINGS: Heart and mediastinal contours are within normal limits. No focal opacities or effusions. No acute bony abnormality. Degenerative changes in the thoracic spine. IMPRESSION: No active cardiopulmonary disease. Electronically Signed   By: Rolm Baptise M.D.   On: 12/03/2019 08:53   CARDIAC CATHETERIZATION  Result Date: 11/22/2019  Prox LAD to Mid LAD lesion is 99% stenosed.  2nd Diag lesion is 90% stenosed.  Mid LAD lesion is 99% stenosed.  A drug-eluting stent was successfully placed using a STENT RESOLUTE ONYX 2.25X18.  Post intervention, there is a 0% residual stenosis.  A drug-eluting stent was successfully placed using a STENT RESOLUTE ONYX 2.5X12.  Post intervention, there is a 0% residual stenosis.  1. Severe serial stenoses in the mid LAD. 2. Successful PTCA/DES x 2 (non-overlapping) in the mid LAD Recommendations: Will continue ASA and Plavix. Will monitor overnight. Likely discharge home tomorrow. Will continue TAVR workup as an outpatient.   CARDIAC CATHETERIZATION  Result Date: 11/14/2019  Prox RCA-1 lesion is 70% stenosed.  Prox RCA-2 lesion is 95% stenosed.  LPAV lesion is 90% stenosed.  Lat 3rd Mrg lesion is 80% stenosed.  Prox Cx to Mid Cx lesion is 30% stenosed.  Prox LAD to Mid LAD lesion is 99% stenosed.  Mid LAD lesion is 99% stenosed.  2nd Diag  lesion is 90% stenosed.  1. Severe proximal LAD stenosis, calcified. Severe mid LAD stenosis at the takeoff of the moderate caliber diagonal branch, calcified. 2. Severe distal AV groove stenosis in the left dominant Circumflex. Severe OM 2 stenosis. 3. Moderate caliber non-dominant RCA with patent proximal stent with moderately severe restenosis. Severe mid RCA stenosis. 4. Moderately severe to severe aortic stenosis (mean gradient 26.6 mmHg, peak to peak gradient 31 mmHg, AVA 1.06 cm2). Recommendations: He has moderately severe to severe AS and three vessel CAD. I will admit him to telemetry. Given elevated pressures, will begin IV Lasix. I will ask our CT surgery team to see him  to discuss CABG/surgical AVR vs staged PCI and TAVR. Will obtain pre-TAVR CT scans to fully assess his aorta and access. In regards to bypass, the RCA is non-dominant. I think he would benefit from bypass of the LAD and potentially the OM branch and left sided PDA. If he is not felt to be a candidate for CABG, we would plan staged PCI of the LAD which would require atherectomy prior to stent placement. The LAD is calcified. The proximal LAD stenosis could be easily treated with orbital atherectomy and stenting. The mid LAD lesion is at the takeoff of a moderate caliber diagonal branch which also has a severe ostial stenosis. This would be higher risk in regards to potentially losing flow down that diagonal branch post stenting of the LAD. He has mild memory issues but is overall functional at baseline, recently limited by dyspnea with exertion. He may be a candidate for open surgery with AVR and bypass.   CT CORONARY MORPH W/CTA COR W/SCORE W/CA W/CM &/OR WO/CM  Addendum Date: 11/16/2019   ADDENDUM REPORT: 11/16/2019 11:10 CLINICAL DATA:  Severe Aortic Stenosis. EXAM: Cardiac TAVR CT TECHNIQUE: The patient was scanned on a [employer]. A 120 kV retrospective scan was triggered in the descending thoracic aorta at 111 HU's.  Gantry rotation speed was 250 msecs and collimation was .6 mm. No beta blockade or nitro were given. The 3D data set was reconstructed in 5% intervals of the R-R cycle. Systolic and diastolic phases were analyzed on a dedicated work station using MPR, MIP and VRT modes. The patient received 80 cc of contrast. FINDINGS: Image quality: Excellent. Noise artifact is: Limited. Valve Morphology: The aortic valve is tricuspid with severe thickening and severe calcifications. There NCC is contains diffuse bulky leaflet calcifications. There is limited movement in systole of the leaflets. Aortic Valve Calcium score: 2012 Aortic annular dimension: Phase assessed: 25% Annular area: 516 mm2 Annular perimeter: 81.7 mm Max diameter: 28.2 mm Min diameter: 24.2 mm Annular and subannular calcification: None. Optimal coplanar projection: RAO 3 CAU 6 Coronary Artery Height above Annulus: Left Main: 13.0 mm Right Coronary: 16.5 mm Sinus of Valsalva Measurements: Non-coronary: 39 mm Right-coronary: 38 mm Left-coronary: 37 mm Sinus of Valsalva Height: Non-coronary: 28.6 mm Right-coronary: 22.4 mm Left-coronary: 20.9 mm Sinotubular Junction: 37 mm. Ascending Thoracic Aorta: 40 mm. Coronary Arteries: Normal coronary origin. Left dominance. The study was performed without use of NTG and is insufficient for plaque evaluation. Please refer to recent cardiac catheterization for coronary assessment. Severe 3-vessel calcifications noted. Cardiac Morphology: Right Atrium: Right atrial size is within normal limits. Right Ventricle: The right ventricular cavity is within normal limits. Left Atrium: Left atrial size is normal in size with no left atrial appendage filling defect. Left Ventricle: The ventricular cavity size is within normal limits. There are no stigmata of prior infarction. There is no abnormal filling defect. Normal left ventricular function, LVEF=67%. No regional wall motion abnormalities. Pulmonary arteries: Dilated pulmonary  artery suggestive of pulmonary hypertension. Pulmonary veins: Normal pulmonary venous drainage. Pericardium: Normal thickness with no significant effusion or calcium present. Mitral Valve: The mitral valve is normal structure without significant calcification. Extra-cardiac findings: See attached radiology report for non-cardiac structures. IMPRESSION: 1. Tricuspid aortic valve with severe aortic stenosis (calcium score = 2012). 2. Bulky calcifications noted in the [place]. 3. Annular measurements appropriate for 26 mm Edwards Sapien 3 TAVR. 4. No significant annular or subannular calcifications. 5. Sufficient coronary to annulus distance. 6. Optimal Fluoroscopic Angle for Delivery:  RAO 3 CAU 6 7. Dilated pulmonary artery suggestive of pulmonary hypertension. 8. Severe 3-vessel coronary calcifications. [place] T. Audie Box, MD Electronically Signed   By: Eleonore Chiquito   On: 11/16/2019 11:10   Result Date: 11/16/2019 EXAM: OVER-READ INTERPRETATION  CT CHEST The following report is an over-read performed by radiologist Dr. Vinnie Langton of [facility], [place] on 11/16/2019. This over-read does not include interpretation of cardiac or coronary anatomy or pathology. The coronary calcium score/coronary CTA interpretation by the cardiologist is attached. COMPARISON:  Chest CT 12/18/2017. FINDINGS: Extracardiac findings will be described separately under dictation for contemporaneously obtained CTA chest, abdomen and pelvis. IMPRESSION: Please see separate dictation for contemporaneously obtained CTA chest, abdomen and pelvis 11/15/2019 for full description of relevant extracardiac findings. Electronically Signed: By: Vinnie Langton M.D. On: 11/16/2019 07:29   DG Chest Port 1 View  Result Date: 12/04/2019 CLINICAL DATA:  Aortic stenosis. EXAM: PORTABLE CHEST 1 VIEW COMPARISON:  11/30/2019 FINDINGS: 1358 hours. Low volumes. The cardio pericardial silhouette is enlarged. The lungs are clear without focal pneumonia,  edema, pneumothorax or pleural effusion. The visualized bony structures of the thorax show no acute abnormality. Telemetry leads overlie the chest. IMPRESSION: Low volume film without acute cardiopulmonary findings. Electronically Signed   By: Misty Stanley M.D.   On: 12/04/2019 14:29   CT ANGIO CHEST AORTA W/CM & OR WO/CM  Result Date: 11/16/2019 CLINICAL DATA:  79 year old male with history of severe aortic stenosis. Preprocedural study prior to potential transcatheter aortic valve replacement (TAVR) procedure. EXAM: CT ANGIOGRAPHY CHEST, ABDOMEN AND PELVIS TECHNIQUE: Non-contrast CT of the chest was initially obtained. Multidetector CT imaging through the chest, abdomen and pelvis was performed using the standard protocol during bolus administration of intravenous contrast. Multiplanar reconstructed images and MIPs were obtained and reviewed to evaluate the vascular anatomy. CONTRAST:  194mL OMNIPAQUE IOHEXOL 350 MG/ML SOLN COMPARISON:  Chest CT 12/18/2017. CT the abdomen and pelvis 03/11/2008. FINDINGS: CTA CHEST FINDINGS Cardiovascular: Heart size is normal. There is no significant pericardial fluid, thickening or pericardial calcification. There is aortic atherosclerosis, as well as atherosclerosis of the great vessels of the mediastinum and the coronary arteries, including calcified atherosclerotic plaque in the left main, left anterior descending, left circumflex and right coronary arteries. Severe thickening calcification of the aortic valve. Mediastinum/Lymph Nodes: No pathologically enlarged mediastinal or hilar lymph nodes. Esophagus is unremarkable in appearance. No axillary lymphadenopathy. Lungs/Pleura: Calcified pleural plaques in the thorax bilaterally, indicative of asbestos related pleural disease. No acute consolidative airspace disease. No pleural effusions. No suspicious appearing pulmonary nodules or masses are noted. Musculoskeletal/Soft Tissues: There are no aggressive appearing lytic or  blastic lesions noted in the visualized portions of the skeleton. CTA ABDOMEN AND PELVIS FINDINGS Hepatobiliary: No suspicious cystic or solid hepatic lesions. No intra or extrahepatic biliary ductal dilatation. Status post cholecystectomy. Pancreas: No pancreatic mass. No pancreatic ductal dilatation. No pancreatic or peripancreatic fluid collections or inflammatory changes. Spleen: Unremarkable. Adrenals/Urinary Tract: Low-attenuation lesions in the kidneys bilaterally, largest of which are compatible with simple cysts, measuring up to 3.0 x 2.2 cm in the interpolar region of the left kidney. Other subcentimeter low-attenuation lesions in both kidneys, too small to definitively characterize, but statistically likely to represent tiny cysts. No hydroureteronephrosis. Urinary bladder is unremarkable in appearance. Bilateral adrenal glands are normal in appearance. Stomach/Bowel: Normal appearance of the stomach. Small periampullary duodenal diverticulum, without surrounding inflammatory changes to suggest an associated diverticulitis at this time. No pathologic dilatation of small bowel or colon.  Normal appendix. Vascular/Lymphatic: Aortic atherosclerosis, without evidence of aneurysm or dissection in the abdominal or pelvic vasculature. No lymphadenopathy noted in the abdomen or pelvis. Reproductive: Prostate gland and seminal vesicles are unremarkable in appearance. Other: No significant volume of ascites.  No pneumoperitoneum. Musculoskeletal: Status post bilateral hip arthroplasty. There are no aggressive appearing lytic or blastic lesions noted in the visualized portions of the skeleton. VASCULAR MEASUREMENTS PERTINENT TO TAVR: AORTA: Minimal Aortic Diameter-14 x 12 mm Severity of Aortic Calcification-severe RIGHT PELVIS: Right Common Iliac Artery - Minimal Diameter-10.5 x 8.5 mm Tortuosity-mild-to-moderate Calcification-moderate Right External Iliac Artery - Minimal Diameter-7.6 x 8.0 mm Tortuosity-moderate to  severe Calcification-none Right Common Femoral Artery - Minimal Diameter-7.0 x 8.4 mm Tortuosity-mild Calcification-none LEFT PELVIS: Left Common Iliac Artery - Minimal Diameter-9.2 x 8.4 mm Tortuosity-mild-to-moderate Calcification-moderate Left External Iliac Artery - Minimal Diameter-8.8 x 7.6 mm Tortuosity-mild Calcification-none Left Common Femoral Artery - Minimal Diameter-8.5 x 7.9 mm Tortuosity-mild Calcification-none Review of the MIP images confirms the above findings. IMPRESSION: 1. Vascular findings and measurements pertinent to potential TAVR procedure, as detailed above. 2. Severe thickening calcification of the aortic valve, compatible with reported clinical history of severe aortic stenosis. 3. Aortic atherosclerosis, in addition to left main and 3 vessel coronary artery disease. Assessment for potential risk factor modification, dietary therapy or pharmacologic therapy may be warranted, if clinically indicated. 4. Calcified pleural plaques in the thorax bilaterally compatible with asbestos related pleural disease. 5. Additional incidental findings, as above. Electronically Signed   By: Vinnie Langton M.D.   On: 11/16/2019 11:09   VAS US CAROTID  Result Date: 11/24/2019 Carotid Arterial Duplex Study Indications:       Pre tavr. Risk Factors:      Hypertension, hyperlipidemia, Diabetes. Comparison Study:  previous 01/08/15 Performing Technologist: Abram Sander RVS  Examination Guidelines: A complete evaluation includes B-mode imaging, spectral Doppler, color Doppler, and power Doppler as needed of all accessible portions of each vessel. Bilateral testing is considered an integral part of a complete examination. Limited examinations for reoccurring indications may be performed as noted.  Right Carotid Findings: +----------+--------+--------+--------+------------------+--------+           PSV cm/sEDV cm/sStenosisPlaque DescriptionComments  +----------+--------+--------+--------+------------------+--------+ CCA Prox  64      10              heterogenous               +----------+--------+--------+--------+------------------+--------+ CCA Distal69      8               heterogenous               +----------+--------+--------+--------+------------------+--------+ ICA Prox  44      12      1-39%   heterogenous      tortuous +----------+--------+--------+--------+------------------+--------+ ICA Distal82      14                                         +----------+--------+--------+--------+------------------+--------+ ECA       74      8                                          +----------+--------+--------+--------+------------------+--------+ +----------+--------+-------+--------+-------------------+           PSV cm/sEDV cmsDescribeArm Pressure (mmHG) +----------+--------+-------+--------+-------------------+  Subclavian111                                        +----------+--------+-------+--------+-------------------+ +---------+--------+--+--------+-+ VertebralPSV cm/s37EDV cm/s8 +---------+--------+--+--------+-+  Left Carotid Findings: +----------+--------+--------+--------+------------------+--------+           PSV cm/sEDV cm/sStenosisPlaque DescriptionComments +----------+--------+--------+--------+------------------+--------+ CCA Prox  122     14              heterogenous               +----------+--------+--------+--------+------------------+--------+ CCA Distal86      16              heterogenous               +----------+--------+--------+--------+------------------+--------+ ICA Prox  93      21      1-39%   heterogenous      tortuous +----------+--------+--------+--------+------------------+--------+ ICA Distal92      16                                         +----------+--------+--------+--------+------------------+--------+ ECA       100     6                                           +----------+--------+--------+--------+------------------+--------+ +----------+--------+--------+--------+-------------------+           PSV cm/sEDV cm/sDescribeArm Pressure (mmHG) +----------+--------+--------+--------+-------------------+ Subclavian114                                         +----------+--------+--------+--------+-------------------+ +---------+--------+--+--------+--+---------+ VertebralPSV cm/s38EDV cm/s11Antegrade +---------+--------+--+--------+--+---------+   Summary: Right Carotid: Velocities in the right ICA are consistent with a 1-39% stenosis. Left Carotid: Velocities in the left ICA are consistent with a 1-39% stenosis. Vertebrals: Bilateral vertebral arteries demonstrate antegrade flow. *See table(s) above for measurements and observations.  Electronically signed by Ruta Hinds MD on 11/24/2019 at 11:47:00 AM.    Final    ECHOCARDIOGRAM LIMITED  Result Date: 12/04/2019    ECHOCARDIOGRAM LIMITED REPORT   Patient Name:   [name] Date of Exam: 12/04/2019 Medical Rec #:  [id number]          Height:       66.0 in Accession #:    [id number]         Weight:       230.0 lb Date of Birth:  07/20/1940           BSA:          2.122 m Patient Age:    79 years           BP:           161/69 mmHg Patient Gender: M                  HR:           55 bpm. Exam Location:  Inpatient Procedure: Echo Assisted Procedure, Limited Echo, Limited Color Doppler and            Cardiac Doppler Indications:     Aortic Stenosis I35.0  History:  Patient has prior history of Echocardiogram examinations, most                  recent 10/31/2019. Previous Myocardial Infarction; Risk                  Factors:Diabetes and Dyslipidemia.  Sonographer:     Mikki Santee RDCS (AE) Referring Phys:  [place] Diagnosing Phys: Sanda Klein MD                   PRE-PROCEDURAL FINDINGS:                   Normal left ventricular  systolic function. Estimated LVEF                  60-65%.                  There are no regional wall motion abnormalities.                  Severe calcific aortic stenosis. Trileaflet aortic valve.                  Peak aortic valve gradient 37 mm Hg, mean gradient 21 mm Hg                  (likely underestimated due to difficulty aligning the Doppler                  beam with patient in supine position).                  Dimensionless obstructive index 0.20, calculated aortic valve                  area is 1.1 cm.                  No aortic insufficiency.                  Trivial mitral insufficiency.                  No pericardial effusion.                   POST-PROCEDURAL FINDINGS:                   Hyperdynamic left ventricular systolic function. Estimated LVEF                  60-65%.                  There are no regional wall motion abnormalities.                  Well-seated TAVR stent-valve.                  Peak aortic valve gradient 10 mm Hg, mean gradient 6 mm Hg.                  Dimensionless obstructive index 0.45, calculated aortic valve                  area is 2.55 cm.                  There is no aortic insufficiency and no perivalvular leak.                  No mitral insufficiency.  No pericardial effusion. IMPRESSIONS  1. Left ventricular ejection fraction, by estimation, is 60 to 65%. The left ventricle has normal function. The left ventricle has no regional wall motion abnormalities. There is mild concentric left ventricular hypertrophy.  2. Right ventricular systolic function is normal. The right ventricular size is normal.  3. The mitral valve is normal in structure. Trivial mitral valve regurgitation.  4. The aortic valve has been repaired/replaced. Aortic valve regurgitation is not visualized. Procedure Date: 12/04/2019. Aortic valve mean gradient measures 6.0 mmHg. Aortic valve Vmax measures 1.61 m/s.  5. There is borderline dilatation of the aortic root, measuring  40 mm. FINDINGS  Left Ventricle: Left ventricular ejection fraction, by estimation, is 60 to 65%. The left ventricle has normal function. The left ventricle has no regional wall motion abnormalities. The left ventricular internal cavity size was normal in size. There is  mild concentric left ventricular hypertrophy. Right Ventricle: The right ventricular size is normal. Right ventricular systolic function is normal. Pericardium: There is no evidence of pericardial effusion. Mitral Valve: The mitral valve is normal in structure. Trivial mitral valve regurgitation. Aortic Valve: The aortic valve has been repaired/replaced. Aortic valve regurgitation is not visualized. Aortic valve mean gradient measures 6.0 mmHg. Aortic valve peak gradient measures 10.4 mmHg. Aortic valve area, by VTI measures 2.55 cm. There is a 26 mm Sapien prosthetic, stented (TAVR) valve present in the aortic position. Aorta: There is borderline dilatation of the aortic root, measuring 40 mm. LEFT VENTRICLE PLAX 2D LVOT diam:     2.70 cm LV SV:         102 LV SV Index:   48 LVOT Area:     5.73 cm  AORTIC VALVE AV Area (Vmax):    2.36 cm AV Area (Vmean):   2.59 cm AV Area (VTI):     2.55 cm AV Vmax:           161.00 cm/s AV Vmean:          111.000 cm/s AV VTI:            0.399 m AV Peak Grad:      10.4 mmHg AV Mean Grad:      6.0 mmHg LVOT Vmax:         66.50 cm/s LVOT Vmean:        50.200 cm/s LVOT VTI:          0.178 m LVOT/AV VTI ratio: 0.45  SHUNTS Systemic VTI:  0.18 m Systemic Diam: 2.70 cm Sanda Klein MD Electronically signed by Sanda Klein MD Signature Date/Time: 12/04/2019/4:15:55 PM    Final    Structural Heart Procedure  Result Date: 12/04/2019 See surgical note for result.  CT Angio Abd/Pel w/ and/or w/o  Result Date: 11/16/2019 CLINICAL DATA:  79 year old male with history of severe aortic stenosis. Preprocedural study prior to potential transcatheter aortic valve replacement (TAVR) procedure. EXAM: CT ANGIOGRAPHY  CHEST, ABDOMEN AND PELVIS TECHNIQUE: Non-contrast CT of the chest was initially obtained. Multidetector CT imaging through the chest, abdomen and pelvis was performed using the standard protocol during bolus administration of intravenous contrast. Multiplanar reconstructed images and MIPs were obtained and reviewed to evaluate the vascular anatomy. CONTRAST:  142mL OMNIPAQUE IOHEXOL 350 MG/ML SOLN COMPARISON:  Chest CT 12/18/2017. CT the abdomen and pelvis 03/11/2008. FINDINGS: CTA CHEST FINDINGS Cardiovascular: Heart size is normal. There is no significant pericardial fluid, thickening or pericardial calcification. There is aortic atherosclerosis, as well as atherosclerosis of the great vessels of the  mediastinum and the coronary arteries, including calcified atherosclerotic plaque in the left main, left anterior descending, left circumflex and right coronary arteries. Severe thickening calcification of the aortic valve. Mediastinum/Lymph Nodes: No pathologically enlarged mediastinal or hilar lymph nodes. Esophagus is unremarkable in appearance. No axillary lymphadenopathy. Lungs/Pleura: Calcified pleural plaques in the thorax bilaterally, indicative of asbestos related pleural disease. No acute consolidative airspace disease. No pleural effusions. No suspicious appearing pulmonary nodules or masses are noted. Musculoskeletal/Soft Tissues: There are no aggressive appearing lytic or blastic lesions noted in the visualized portions of the skeleton. CTA ABDOMEN AND PELVIS FINDINGS Hepatobiliary: No suspicious cystic or solid hepatic lesions. No intra or extrahepatic biliary ductal dilatation. Status post cholecystectomy. Pancreas: No pancreatic mass. No pancreatic ductal dilatation. No pancreatic or peripancreatic fluid collections or inflammatory changes. Spleen: Unremarkable. Adrenals/Urinary Tract: Low-attenuation lesions in the kidneys bilaterally, largest of which are compatible with simple cysts, measuring up to  3.0 x 2.2 cm in the interpolar region of the left kidney. Other subcentimeter low-attenuation lesions in both kidneys, too small to definitively characterize, but statistically likely to represent tiny cysts. No hydroureteronephrosis. Urinary bladder is unremarkable in appearance. Bilateral adrenal glands are normal in appearance. Stomach/Bowel: Normal appearance of the stomach. Small periampullary duodenal diverticulum, without surrounding inflammatory changes to suggest an associated diverticulitis at this time. No pathologic dilatation of small bowel or colon. Normal appendix. Vascular/Lymphatic: Aortic atherosclerosis, without evidence of aneurysm or dissection in the abdominal or pelvic vasculature. No lymphadenopathy noted in the abdomen or pelvis. Reproductive: Prostate gland and seminal vesicles are unremarkable in appearance. Other: No significant volume of ascites.  No pneumoperitoneum. Musculoskeletal: Status post bilateral hip arthroplasty. There are no aggressive appearing lytic or blastic lesions noted in the visualized portions of the skeleton. VASCULAR MEASUREMENTS PERTINENT TO TAVR: AORTA: Minimal Aortic Diameter-14 x 12 mm Severity of Aortic Calcification-severe RIGHT PELVIS: Right Common Iliac Artery - Minimal Diameter-10.5 x 8.5 mm Tortuosity-mild-to-moderate Calcification-moderate Right External Iliac Artery - Minimal Diameter-7.6 x 8.0 mm Tortuosity-moderate to severe Calcification-none Right Common Femoral Artery - Minimal Diameter-7.0 x 8.4 mm Tortuosity-mild Calcification-none LEFT PELVIS: Left Common Iliac Artery - Minimal Diameter-9.2 x 8.4 mm Tortuosity-mild-to-moderate Calcification-moderate Left External Iliac Artery - Minimal Diameter-8.8 x 7.6 mm Tortuosity-mild Calcification-none Left Common Femoral Artery - Minimal Diameter-8.5 x 7.9 mm Tortuosity-mild Calcification-none Review of the MIP images confirms the above findings. IMPRESSION: 1. Vascular findings and measurements pertinent  to potential TAVR procedure, as detailed above. 2. Severe thickening calcification of the aortic valve, compatible with reported clinical history of severe aortic stenosis. 3. Aortic atherosclerosis, in addition to left main and 3 vessel coronary artery disease. Assessment for potential risk factor modification, dietary therapy or pharmacologic therapy may be warranted, if clinically indicated. 4. Calcified pleural plaques in the thorax bilaterally compatible with asbestos related pleural disease. 5. Additional incidental findings, as above. Electronically Signed   By: Vinnie Langton M.D.   On: 11/16/2019 11:09   Disposition   Pt is being discharged home today in good condition.  Follow-up Plans & Appointments     Follow-up Information     Eileen Stanford, PA-C. Go on 12/13/2019.   Specialties: Cardiology, Radiology Why: @ 3:30pm. please arrive at least 10 minutes early.  Contact information: [address] [place] [address] [phone]                Discharge Instructions     Amb Referral to Cardiac Rehabilitation   Complete by: As directed  Diagnosis: Valve Replacement   Valve: Aortic Comment - TAVR   After initial evaluation and assessments completed: Virtual Based Care may be provided alone or in conjunction with Phase 2 Cardiac Rehab based on patient barriers.: Yes   Call MD for:  redness, tenderness, or signs of infection (pain, swelling, redness, odor or green/yellow discharge around incision site)   Complete by: As directed    Diet - low sodium heart healthy   Complete by: As directed    Discharge instructions   Complete by: As directed    ACTIVITY AND EXERCISE  Daily activity and exercise are an important part of your recovery. People recover at different rates depending on their general health and type of valve procedure.  Most people require six to 10 weeks to feel recovered.  No lifting, pushing, pulling more than 10 pounds  (examples to avoid: groceries, vacuuming, gardening, golfing):  - For one week with a procedure through the groin.  - For six weeks for procedures through the chest wall.  - For three months for procedures through the breast-bone. NOTE: You will typically see one of our providers 7-10 days after your procedure to discuss [place] the above activities.    DRIVING  Do not drive for four weeks after the date of your procedure.  If you have been told by your doctor in the past that you may not drive, you must talk with him/her before you begin driving again.  When you resume driving, you must have someone with you.   DRESSING  Groin site: you may leave the clear dressing over the site for up to one week or until it falls off.   HYGIENE  If you had a femoral (leg) procedure, you may take a shower when you return home. After the shower, pat the site dry. Do NOT use powder, oils or lotions in your groin area until the site has completely healed.  If you had a chest procedure, you may shower when you return home unless specifically instructed not to by your discharging practitioner.  - DO NOT scrub incision; pat dry with a towel  - DO NOT apply any lotions, oils, powders to the incision  - No tub baths / swimming for at least six weeks.  If you notice any fevers, chills, increased pain, swelling, bleeding or pus, please contact your doctor.  ADDITIONAL INFORMATION  If you are going to have an upcoming dental procedure, please contact our office as you may require antibiotics ahead of time to prevent infection on your heart valve.   Increase activity slowly   Complete by: As directed        Discharge Medications   Allergies as of 12/05/2019       Reactions   Ace Inhibitors Hives, Swelling, Rash   Rash,hives,tongue swelling   Angiotensin Receptor Blockers    Unknown reaction    Ciprofloxacin Other (See Comments)   confusion   Oxycodone-acetaminophen Other (See  Comments)   Unknown reaction   Robaxin [methocarbamol] Other (See Comments)   Confusion    Toradol [ketorolac Tromethamine] Other (See Comments)   confusion   Valium Other (See Comments)   Hallucinations; confusion   Doxycycline Hives, Rash        Medication List     TAKE these medications    acetaminophen 650 MG CR tablet Commonly known as: TYLENOL Take 1,300 mg by mouth in the morning, at noon, and at bedtime.   amLODipine 10 MG tablet Commonly known  as: NORVASC Take 1 tablet (10 mg total) by mouth daily.   aspirin EC 81 MG tablet Take 81 mg by mouth at bedtime.   atorvastatin 40 MG tablet Commonly known as: LIPITOR Take 1 tablet (40 mg total) by mouth every evening.   butalbital-acetaminophen-caffeine 50-325-40 MG tablet Commonly known as: FIORICET Take 1 tablet by mouth 2 (two) times daily as needed for headache.   cetirizine 10 MG tablet Commonly known as: ZYRTEC Take 10 mg by mouth daily with supper.   clopidogrel 75 MG tablet Commonly known as: PLAVIX Take 1 tablet (75 mg total) by mouth daily.   Dexcom G6 Receiver Devi USE TO CHECK BLOOD SUGAR UP TO 4 TIMES DAILY AS DIRECTED. 1 DEVICE PER YEAR. DX: E11.9   Dexcom G6 Sensor Misc USE TO CHECK BLOOD SUGAR UP TO 4 TIMES DAILY AS DIRECTED. CHANGE SENSOR EVERY 30 DAYS. DX: E11.9   Dexcom G6 Transmitter Misc USE TO CHECK BLOOD SUGAR UP TO 4 TIMES DAILY AS DIRECTED. CHANGE TRANSMITTER EVERY 3 MONTHS. DX: E11.9   docusate sodium 100 MG capsule Commonly known as: COLACE Take 100 mg by mouth daily as needed for mild constipation or moderate constipation.   donepezil 5 MG tablet Commonly known as: ARICEPT Take 5 mg by mouth at bedtime.   famotidine 20 MG tablet Commonly known as: PEPCID TAKE 1 TABLET BY MOUTH  TWICE DAILY WITH MEALS   Fish Oil 1200 MG Caps Take 1,200-2,400 mg by mouth See admin instructions. Tale 1200 mg in the morning and 2400 mg in the evening   furosemide 20 MG tablet Commonly  known as: LASIX Take 3 tablets (60 mg total) by mouth daily.   glucose blood test strip Test 4 times daily Dx E11.59   HumaLOG KwikPen 100 UNIT/ML KwikPen Generic drug: insulin lispro INJECT 0 TO 15 UNITS  SUBCUTANEOUSLY 3 TIMES  DAILY BEFORE MEALS ;  70-150=8 UNITS, 151-200= 9  UNITS. What changed: See the new instructions.   isosorbide mononitrate 30 MG 24 hr tablet Commonly known as: IMDUR TAKE 1 TABLET BY MOUTH IN  THE EVENING What changed: when to take this   Lantus SoloStar 100 UNIT/ML Solostar Pen Generic drug: insulin glargine INJECT SUBCUTANEOUSLY 52  UNITS AT BEDTIME What changed: See the new instructions.   metFORMIN 500 MG 24 hr tablet Commonly known as: GLUCOPHAGE-XR TAKE 2 TABLETS BY MOUTH  TWICE DAILY   nitroGLYCERIN 0.4 MG SL tablet Commonly known as: NITROSTAT DISSOLVE ONE TABLET UNDER THE TONGUE EVERY 5 MINUTES AS NEEDED FOR CHEST PAIN.  DO NOT EXCEED A TOTAL OF 3 DOSES IN 15 MINUTES What changed:  how much to take how to take this when to take this reasons to take this   PARoxetine 20 MG tablet Commonly known as: PAXIL TAKE 1 TABLET BY MOUTH  DAILY   potassium chloride SA 20 MEQ tablet Commonly known as: KLOR-CON Take 1 tablet (20 mEq total) by mouth daily.   tamsulosin 0.4 MG Caps capsule Commonly known as: FLOMAX TAKE 1 CAPSULE BY MOUTH  DAILY   Turmeric Curcumin 500 MG Caps Take 500 mg by mouth daily.   vitamin B-12 1000 MCG tablet Commonly known as: CYANOCOBALAMIN Take 2,000 mcg by mouth daily.   Vitamin D3 50 MCG (2000 UT) capsule Take 4,000 Units by mouth daily.        Outstanding Labs/Studies   Follow up echo  Duration of Discharge Encounter   Greater than 30 minutes including physician time.  Signed, Reino Bellis,  NP 12/05/2019, 1:38 PM  I have personally seen and examined this patient. I agree with the assessment and plan as outlined above.  See my full progress notes from today. Discharge home today.    [name] 12/05/2019 2:43 PM

## 2019-12-05 NOTE — Progress Notes (Signed)
  Echocardiogram 2D Echocardiogram has been performed.  [name] 12/05/2019, 1:40 PM

## 2019-12-06 ENCOUNTER — Telehealth: Payer: Self-pay

## 2019-12-06 LAB — ECHOCARDIOGRAM LIMITED
AR max vel: 2.06 cm2
AV Area VTI: 2.28 cm2
AV Area mean vel: 1.99 cm2
AV Mean grad: 13 mmHg
AV Peak grad: 22.3 mmHg
Ao pk vel: 2.36 m/s
Height: 66 in
S' Lateral: 3.5 cm
Weight: 3734.4 oz

## 2019-12-06 NOTE — Telephone Encounter (Signed)
Patient's wife returned my call in regards to pt discharge from [facility] on 12/05/2019.  Patient understands to follow up with provider Nell Range PA-C on 12/13/2019 at 3:30 PM at [facility] office. Patient understands discharge instructions? yes Patient understands medications and regiment? yes Patient understands to bring all medications to this visit? Yes  The pt is doing well.  The pt's wife did ask when the pt can restart Metformin and I advised that the pt can restart this Friday morning.

## 2019-12-06 NOTE — Telephone Encounter (Signed)
Attempted TOC call at mobile and home number.  No answer and message left at both numbers for pt to call [phone] for on-call staff if any questions or concerns arise.

## 2019-12-13 ENCOUNTER — Ambulatory Visit: Payer: Medicare Other | Admitting: Physician Assistant

## 2019-12-13 ENCOUNTER — Other Ambulatory Visit: Payer: Self-pay | Admitting: Physician Assistant

## 2019-12-13 ENCOUNTER — Other Ambulatory Visit: Payer: Self-pay

## 2019-12-13 ENCOUNTER — Encounter: Payer: Self-pay | Admitting: Physician Assistant

## 2019-12-13 VITALS — BP 134/72 | HR 72 | Ht 66.0 in | Wt 239.4 lb

## 2019-12-13 DIAGNOSIS — Z9861 Coronary angioplasty status: Secondary | ICD-10-CM

## 2019-12-13 DIAGNOSIS — I5032 Chronic diastolic (congestive) heart failure: Secondary | ICD-10-CM

## 2019-12-13 DIAGNOSIS — Z952 Presence of prosthetic heart valve: Secondary | ICD-10-CM

## 2019-12-13 DIAGNOSIS — I10 Essential (primary) hypertension: Secondary | ICD-10-CM

## 2019-12-13 DIAGNOSIS — I2510 Atherosclerotic heart disease of native coronary artery without angina pectoris: Secondary | ICD-10-CM | POA: Diagnosis not present

## 2019-12-13 MED ORDER — FUROSEMIDE 20 MG PO TABS
20.0000 mg | ORAL_TABLET | Freq: Every day | ORAL | 3 refills | Status: DC
Start: 2019-12-13 — End: 2020-01-10

## 2019-12-13 NOTE — Patient Instructions (Signed)
Medication Instructions:  1) STOP POTASSIUM 2) DECREASE LASIX to 20 mg daily *If you need a refill on your cardiac medications before your next appointment, please call your pharmacy*  Follow-Up: Keep your appointments as scheduled!

## 2019-12-13 NOTE — Progress Notes (Signed)
[name]                                       Cardiology Office Note    Date:  12/13/2019   ID:  [name], DOB Jun 07, 1940, MRN [id number]  PCP:  Dettinger, Fransisca Kaufmann, MD  Cardiologist:  Rozann Lesches, MD / Dr. Angelena Form & Dr. Cyndia Bent (TAVR)  CC: [facility] s/p TAVR  History of Present Illness:  [name] is a 79 y.o. male with a history of CADs/p BMS to RCA (2006) and more recent PCI to LAD (11/2019), HTN, GERD,HLD, morbid obesity, sleep apnea,IDDM andmoderate to severeaortic stenosis s/p TAVR (12/04/19) who presents to clinic for follow up.  Mr Burley had a 6 to 61-month history of progressive exertional shortness of breath and fatigue which was worsened recently and was markedly limiting his ability to be active. He had no chest pain or pressure. He has had some swelling in his feet. He reported some orthopnea. He has been followed for moderate aortic stenosis and had an echocardiogram on 10/31/19 showing a mean gradient across the aortic valve of 23.2 mmHg and an aortic valve area of 0.98 cm. Dimensionless index of 0.28. Cardiac catheterization on 11/14/2019 showed severe multivessel coronary disease. The LAD hada 99% mid vessel stenosis and a second 90% stenosis further downstream to the takeoff of a second diagonal branchthat were successfully treated with PCI/DES.The left circumflex has a large third marginal branch that had 80% stenosis in the lateral subbranch. The right coronary artery is smaller and nondominant with 70% and 90% stenoses. The mean gradient across aortic valve was 26.6 mmHg the peak to peak gradient of 31 mmHg  The patient was evaluated by the multidisciplinary valve team and underwent successful TAVR with a 26 mm Edwards S3U THV via the TF approach on 12/04/19. Post op echo showed EF 60%, normally functioning TAVR with a mean gradient of 13 mmHg and trivial intravalvular leak. He was discharged on  aspirin and plavix.   Today he presents to clinic for follow up. Here with his wife. Feeling so much better since PCI and TAVR. Still get some dyspnea but much improved from previous. No CP or SOB. No LE edema, orthopnea or PND. He has occasional dizziness, mostly with positional changes but no syncope. No blood in stool or urine. No palpitations. He says he has to urinate all the time and it is very bothersome.     Past Medical History:  Diagnosis Date  . Anxiety   . Aortic atherosclerosis ([place]) 12/19/2017  . Arthritis   . Bursitis of hip   . Cervical spondylosis   . Chronic headaches   . Coronary atherosclerosis of native coronary artery    BMS nondominant RCA 12/2004  . Essential hypertension   . GERD (gastroesophageal reflux disease)   . History of adenomatous polyp of colon   . History of kidney stones   . History of MI (myocardial infarction) 12/2004  . History of viral meningitis 02/28/2018   December 2019  . Hyperlipidemia   . Internal hemorrhoids   . Low back pain   . Nocturia more than twice per night   . OSA (obstructive sleep apnea)   . Spinal stenosis   . Type 2 diabetes mellitus treated with insulin ([place])   . Wears dentures    Upper  Past Surgical History:  Procedure Laterality Date  . CARDIAC CATHETERIZATION  06-14-2004  dr Lia Foyer   total occlusion mD1, RI 30-40%, mRCA nondominant hazy 80% and scattered 30-40%,  ef 71% (medical mangement)  . CARDIOVASCULAR STRESS TEST  09-17-2015   dr Domenic Polite   Low risk nuclear study w/ reversible mild small anteroapical defect,  normal LV function and wall motion, nuclear stress ef 61%  . CATARACT EXTRACTION W/ INTRAOCULAR LENS IMPLANT Right 07/2014  . COLONOSCOPY  09/10/2008   normal  . CORONARY ANGIOPLASTY WITH STENT PLACEMENT  12-17-2004   dr benismhon/ dr Olevia Perches   nonobstructive cad LAD and LCFx,  BMS x1 to total occlusion RCA   . CORONARY ATHERECTOMY  11/22/2019  . CORONARY ATHERECTOMY N/A 11/22/2019   Procedure:  CORONARY ATHERECTOMY;  Surgeon: Burnell Blanks, MD;  Location: [place] [facility];  Service: Cardiovascular;  Laterality: N/A;  . CYSTOSCOPY W/ URETEROSCOPY W/ LITHOTRIPSY  08/2010  . ERCP  03/14/2008  . EYE SURGERY Bilateral    cataracts  . LAPAROSCOPIC CHOLECYSTECTOMY  05/2012  . LEFT HEART CATHETERIZATION WITH CORONARY ANGIOGRAM N/A 05/14/2011   Procedure: LEFT HEART CATHETERIZATION WITH CORONARY ANGIOGRAM;  Surgeon: Sherren Mocha, MD;  Location: [facility] CATH LAB;  Service: Cardiovascular;  Laterality: N/A;    non-obstructive LM, LAD, LCFx, patent RCA stent  . LUMBAR LAMINECTOMY/DECOMPRESSION MICRODISCECTOMY N/A 02/24/2017   Procedure: Microlumbar decompression L4-5, L5-S1;  Surgeon: Susa Day, MD;  Location: [facility] ORS;  Service: Orthopedics;  Laterality: N/A;  120 mins  . RIGHT/LEFT HEART CATH AND CORONARY ANGIOGRAPHY N/A 11/14/2019   Procedure: RIGHT/LEFT HEART CATH AND CORONARY ANGIOGRAPHY;  Surgeon: Burnell Blanks, MD;  Location: [place] [facility];  Service: Cardiovascular;  Laterality: N/A;  . ROTATOR CUFF REPAIR Right 03/2002  . ROTATOR CUFF REPAIR Left 12/11/2015  . TEE WITHOUT CARDIOVERSION N/A 12/04/2019   Procedure: TRANSESOPHAGEAL ECHOCARDIOGRAM (TEE);  Surgeon: Burnell Blanks, MD;  Location: [place] [facility];  Service: Open Heart Surgery;  Laterality: N/A;  . TOTAL HIP ARTHROPLASTY Right 12/20/2008  . TOTAL HIP ARTHROPLASTY Left 07/05/2018   Procedure: TOTAL HIP ARTHROPLASTY ANTERIOR APPROACH;  Surgeon: Gaynelle Arabian, MD;  Location: [facility];  Service: Orthopedics;  Laterality: Left;  164min  . TOTAL KNEE ARTHROPLASTY Bilateral left 12-11-2003/  right 07-31-2004   dr Wynelle Link [facility]  . TRANSCATHETER AORTIC VALVE REPLACEMENT, TRANSFEMORAL N/A 12/04/2019   Procedure: TRANSCATHETER AORTIC VALVE REPLACEMENT, TRANSFEMORAL;  Surgeon: Burnell Blanks, MD;  Location: [place] [facility] LAB;  Service: Open Heart Surgery;  Laterality: N/A;  . UPPER GASTROINTESTINAL  ENDOSCOPY  01/08/2008   bx, inlet patch, duodenitis  . YAG LASER APPLICATION Right 1/61/0960   Procedure: YAG LASER APPLICATION;  Surgeon: Williams Che, MD;  Location: [facility];  Service: Ophthalmology;  Laterality: Right;    Current Medications: Outpatient Medications Prior to Visit  Medication Sig Dispense Refill  . acetaminophen (TYLENOL) 650 MG CR tablet Take 1,300 mg by mouth in the morning, at noon, and at bedtime.     Marland Kitchen amLODipine (NORVASC) 10 MG tablet Take 1 tablet (10 mg total) by mouth daily. 30 tablet 11  . aspirin EC 81 MG tablet Take 81 mg by mouth at bedtime.     Marland Kitchen atorvastatin (LIPITOR) 40 MG tablet Take 1 tablet (40 mg total) by mouth every evening. 30 tablet 11  . butalbital-acetaminophen-caffeine (FIORICET) 50-325-40 MG tablet Take 1 tablet by mouth 2 (two) times daily as needed for headache.    . cetirizine (ZYRTEC) 10  MG tablet Take 10 mg by mouth daily with supper.     . Cholecalciferol (VITAMIN D3) 50 MCG (2000 UT) capsule Take 4,000 Units by mouth daily.     . clopidogrel (PLAVIX) 75 MG tablet Take 1 tablet (75 mg total) by mouth daily. 30 tablet 11  . Continuous Blood Gluc Receiver (DEXCOM G6 RECEIVER) DEVI USE TO CHECK BLOOD SUGAR UP TO 4 TIMES DAILY AS DIRECTED. 1 DEVICE PER YEAR. DX: E11.9 1 each 1  . Continuous Blood Gluc Sensor (DEXCOM G6 SENSOR) MISC USE TO CHECK BLOOD SUGAR UP TO 4 TIMES DAILY AS DIRECTED. CHANGE SENSOR EVERY 30 DAYS. DX: E11.9 3 each 4  . Continuous Blood Gluc Transmit (DEXCOM G6 TRANSMITTER) MISC USE TO CHECK BLOOD SUGAR UP TO 4 TIMES DAILY AS DIRECTED. CHANGE TRANSMITTER EVERY 3 MONTHS. DX: E11.9 1 each 12  . docusate sodium (COLACE) 100 MG capsule Take 100 mg by mouth daily as needed for mild constipation or moderate constipation.     Marland Kitchen donepezil (ARICEPT) 5 MG tablet Take 5 mg by mouth at bedtime.    . famotidine (PEPCID) 20 MG tablet TAKE 1 TABLET BY MOUTH  TWICE DAILY WITH MEALS 180 tablet 0  . glucose blood test strip Test 4 times  daily Dx E11.59 400 each 3  . insulin lispro (HUMALOG) 100 UNIT/ML injection Inject 0-20 Units into the skin 3 (three) times daily as needed for high blood sugar.    . isosorbide mononitrate (IMDUR) 30 MG 24 hr tablet TAKE 1 TABLET BY MOUTH IN  THE EVENING 90 tablet 1  . LANTUS SOLOSTAR 100 UNIT/ML Solostar Pen INJECT SUBCUTANEOUSLY 52  UNITS AT BEDTIME 60 mL 3  . metFORMIN (GLUCOPHAGE-XR) 500 MG 24 hr tablet TAKE 2 TABLETS BY MOUTH  TWICE DAILY 360 tablet 0  . nitroGLYCERIN (NITROSTAT) 0.4 MG SL tablet DISSOLVE ONE TABLET UNDER THE TONGUE EVERY 5 MINUTES AS NEEDED FOR CHEST PAIN.  DO NOT EXCEED A TOTAL OF 3 DOSES IN 15 MINUTES 25 tablet 3  . Omega-3 Fatty Acids (FISH OIL) 1200 MG CAPS Take 1,200-2,400 mg by mouth See admin instructions. Tale 1200 mg in the morning and 2400 mg in the evening    . PARoxetine (PAXIL) 20 MG tablet TAKE 1 TABLET BY MOUTH  DAILY 90 tablet 0  . tamsulosin (FLOMAX) 0.4 MG CAPS capsule TAKE 1 CAPSULE BY MOUTH  DAILY 90 capsule 0  . Turmeric Curcumin 500 MG CAPS Take 500 mg by mouth daily.    . vitamin B-12 (CYANOCOBALAMIN) 1000 MCG tablet Take 2,000 mcg by mouth daily.    . furosemide (LASIX) 20 MG tablet Take 3 tablets (60 mg total) by mouth daily. 90 tablet 3  . potassium chloride SA (KLOR-CON) 20 MEQ tablet Take 1 tablet (20 mEq total) by mouth daily. 30 tablet 11  . HUMALOG KWIKPEN 100 UNIT/ML KwikPen INJECT 0 TO 15 UNITS  SUBCUTANEOUSLY 3 TIMES  DAILY BEFORE MEALS ;  70-150=8 UNITS, 151-200= 9  UNITS. 45 mL 0   No facility-administered medications prior to visit.     Allergies:   Ace inhibitors, Angiotensin receptor blockers, Ciprofloxacin, Oxycodone-acetaminophen, Robaxin [methocarbamol], Toradol [ketorolac tromethamine], Valium, and Doxycycline   Social History   Socioeconomic History  . Marital status: Married    Spouse name: Dione   . Number of children: 1  . Years of education: HS  . Highest education level: High school graduate  Occupational History   . Occupation: RETIRED    Employer: [employer]  Comment: Power plant  Tobacco Use  . Smoking status: Former Smoker    Packs/day: 1.00    Years: 20.00    Pack years: 20.00    Types: Cigarettes    Start date: 01/18/1963    Quit date: 08/11/1988    Years since quitting: 31.3  . Smokeless tobacco: Former Systems developer    Types: Chew    Quit date: 01/11/1989  . Tobacco comment: chewed 1 pack tobacco/day for 15 years  Vaping Use  . Vaping Use: Never used  Substance and Sexual Activity  . Alcohol use: No    Alcohol/week: 0.0 standard drinks  . Drug use: No  . Sexual activity: Yes  Other Topics Concern  . Not on file  Social History Narrative   No regular exercise      Daily caffeine use: 3 cups   Patient is right handed.    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 Difficulty of Paying Living Expenses: Not on file  Food Insecurity:   . Worried About Charity fundraiser in the Last Year: Not on file  . Ran Out of Food in the Last Year: Not on file  Transportation Needs:   . Lack of Transportation (Medical): Not on file  . Lack of Transportation (Non-Medical): Not on file  Physical Activity:   . Days of Exercise per Week: Not on file  . Minutes of Exercise per Session: Not on file  Stress:   . Feeling of Stress : Not on file  Social Connections:   . Frequency of Communication with Friends and Family: Not on file  . Frequency of Social Gatherings with Friends and Family: Not on file  . Attends Religious Services: Not on file  . Active Member of Clubs or Organizations: Not on file  . Attends Archivist Meetings: Not on file  . Marital Status: Not on file     Family History:  The patient's family history includes Bladder Cancer in his brother; Brain cancer in his sister; Breast cancer in his sister; Cancer (age of onset: 4) in his mother;  Cancer - Other in his brother; Colon cancer in his mother; Heart attack in his father; Heart disease in his brother, brother, and father.     ROS:   Please see the history of present illness.    ROS All other systems reviewed and are negative.   PHYSICAL EXAM:   VS:  BP 134/72   Pulse 72   Ht 5\' 6"  (1.676 m)   Wt 239 lb 6.4 oz (108.6 kg)   SpO2 95%   BMI 38.64 kg/m    GEN: Well nourished, well developed, in no acute distress, obese HEENT: normal Neck: no JVD or masses Cardiac: RRR; soft flow murmur. No rubs, or gallops,no edema  Respiratory:  clear to auscultation bilaterally, normal work of breathing GI: soft, nontender, nondistended, + BS MS: no deformity or atrophy Skin: warm and dry, no rash.  Groin sites clear without hematoma or ecchymosis Neuro:  Alert and Oriented x 3, Strength and sensation are intact Psych: euthymic mood, full affect   Wt Readings from Last 3 Encounters:  12/13/19 239 lb 6.4 oz (108.6 kg)  12/05/19 233 lb 6.4 oz (105.9 kg)  11/30/19 236 lb 9.6 oz (107.3 kg)      Studies/Labs Reviewed:  EKG:  EKG is ordered today.  The ekg ordered today demonstrates sinus   Recent Labs: 11/30/2019: ALT 26; B Natriuretic Peptide 23.4 12/05/2019: BUN 16; Creatinine, Ser 1.27; Hemoglobin 12.7; Magnesium 1.7; Platelets 273; Potassium 3.8; Sodium 135   Lipid Panel    Component Value Date/Time   CHOL 123 11/16/2019 0206   CHOL 131 10/01/2019 0938   CHOL 139 06/02/2012 1007   TRIG 125 11/16/2019 0206   TRIG 185 (H) 04/10/2013 0937   TRIG 107 06/02/2012 1007   HDL 32 (L) 11/16/2019 0206   HDL 33 (L) 10/01/2019 0938   HDL 34 (L) 04/10/2013 0937   HDL 40 06/02/2012 1007   CHOLHDL 3.8 11/16/2019 0206   VLDL 25 11/16/2019 0206   LDLCALC 66 11/16/2019 0206   LDLCALC 61 10/01/2019 0938   LDLCALC 62 04/10/2013 0937   LDLCALC 78 06/02/2012 1007    Additional studies/ records that were reviewed today include:  TAVR:  12/04/19  Procedure:   Transcatheter Aortic Valve Replacement - Transfemoral Approach Edwards Sapien 3 THV (size 87mm, model #[id number], serial #[id number])  Co-Surgeons:Christopher Angelena Form, MD and [name], MD   Anesthesiologist:Ossey  Echocardiographer:Croitoru  Pre-operative Echo Findings: ? Severe aortic stenosis ? Normalleft ventricular systolic function  Post-operative Echo Findings: ? Noparavalvular leak ? Normalleft ventricular systolic function  ___________________  Echo 12/05/19 IMPRESSIONS  1. Left ventricular ejection fraction, by estimation, is 60 to 65%. The  left ventricle has normal function. The left ventricle has no regional  wall motion abnormalities. There is mild concentric left ventricular  hypertrophy.  2. Right ventricular systolic function is normal. The right ventricular  size is normal.  3. Left atrial size was severely dilated.  4. The mitral valve is normal in structure. Trivial mitral valve  regurgitation. No evidence of mitral stenosis.  5. There is trivial aortic insufficiency at the anterior aspect of the  prosthesis. This is close to the annulus, but appears to be intravalvular,  rather than a perivalvular leak. The aortic valve has been  repaired/replaced. Aortic valve regurgitation is  trivial. No aortic stenosis is present. There is a 26 mm Sapien  prosthetic (TAVR) valve present in the aortic position. Procedure Date:  12/04/2019. Aortic valve mean gradient measures 13.0 mmHg. Aortic valve  Vmax measures 2.36 m/s.  6. The inferior vena cava is normal in size with greater than 50%  respiratory variability, suggesting right atrial pressure of 3 mmHg.   ASSESSMENT & PLAN:   Severe AS s/p TAVR: doing great with a big clinical improvement since PCI and TAVR. Groin sites healing well. ECG with no HAVB. Continue on aspirin and  plavix. He understands the need for ongoing SBE prophylaxis and gets Abx from his dentist. I will see him back later this month for follow up and echo.   Chronic diastolic CHF: appears euvolemic. He is bothered by urinary frequency. Will plan to decrease lasix from 60mg  to 20mg  daily. Stop Kdur.   CAD: s/p recent PCI. Continue DAPT and statin.   HTN: Bp well controlled today. No changes made  Medication Adjustments/Labs and Tests Ordered: Current medicines are reviewed at length with the patient today.  Concerns regarding medicines are outlined above.  Medication changes, Labs and Tests ordered today are listed in the Patient Instructions below. Patient Instructions  Medication Instructions:  1) STOP POTASSIUM 2) DECREASE LASIX to 20 mg daily *If you need a refill on your cardiac medications before your next appointment, please call your pharmacy*  Follow-Up: Keep your  appointments as scheduled!    Signed, Angelena Form, PA-C  12/13/2019 7:18 PM    [facility] [place], [place], [place]  [address] Phone: [phone]; Fax: [phone]

## 2019-12-17 ENCOUNTER — Other Ambulatory Visit: Payer: Self-pay

## 2019-12-17 ENCOUNTER — Ambulatory Visit (INDEPENDENT_AMBULATORY_CARE_PROVIDER_SITE_OTHER): Payer: Medicare Other | Admitting: Pharmacist

## 2019-12-17 ENCOUNTER — Telehealth: Payer: Self-pay

## 2019-12-17 DIAGNOSIS — E119 Type 2 diabetes mellitus without complications: Secondary | ICD-10-CM

## 2019-12-17 NOTE — Progress Notes (Signed)
      12/17/2019 Name: [name] MRN: [id number] DOB: 06/17/1940   S:  23 yoM presents for diabetes evaluation, education, and management.  His wife is with him and is involved in patient's care Patient was referred and last seen by Primary Care Provider on 10/01/19.  Insurance coverage/medication affordability: UHC medicare  Patient reports adherence with medications.  Current diabetes medications include: metformin, BASAGLAR, humalog  Current hypertension medications include: amlodipine, isosorbide, losartan Goal 130/80  Current hyperlipidemia medications include: atorvastatin   Patient denies hypoglycemic events.   Patient reported dietary habits: Eats 2-3 meals/day Discussed meal planning options and Plate method for healthy eating  Avoid sugary drinks and desserts  Incorporate balanced protein, non starchy veggies, 1 serving of carbohydrate with each meal  Increase water intake  Increase physical activity as able  The patient is asked to make an attempt to improve diet and exercise patterns to aid in medical management of this problem.   Patient-reported exercise habits: n/a    O:  Lab Results  Component Value Date   HGBA1C 8.2 (H) 11/30/2019     Lipid Panel     Component Value Date/Time   CHOL 123 11/16/2019 0206   CHOL 131 10/01/2019 0938   CHOL 139 06/02/2012 1007   TRIG 125 11/16/2019 0206   TRIG 185 (H) 04/10/2013 0937   TRIG 107 06/02/2012 1007   HDL 32 (L) 11/16/2019 0206   HDL 33 (L) 10/01/2019 0938   HDL 34 (L) 04/10/2013 0937   HDL 40 06/02/2012 1007   CHOLHDL 3.8 11/16/2019 0206   VLDL 25 11/16/2019 0206   LDLCALC 66 11/16/2019 0206   LDLCALC 61 10/01/2019 0938   LDLCALC 62 04/10/2013 0937   LDLCALC 78 06/02/2012 1007    Home fasting blood sugars: <180  2 hour post-meal/random blood sugars: <250.  Denies hypoglycemia, non seen on dexcom report   A/P:  Diabetes T2DM currently uncontrolled.  Patient is adherent with  medication. Control is suboptimal due to diet/lifestyle.  -Increase basal (basaglar) back to 56 units daily  -Patinet willing to start GLP1 (paperwork started for trulicity patient assistance)  -Increase meal time Humalog to 41-74 units  -Application for [facility] patient assistance; Re enrolled for 2022; medication to ship to patient's home (call [phone], press 1--if there are issues)  -Extensively discussed pathophysiology of diabetes, recommended lifestyle interventions, dietary effects on blood sugar control  -Counseled on s/sx of and management of hypoglycemia  -Next A1C anticipated 3 months.    Written patient instructions provided.  Total time in face to face counseling 30 minutes.   Follow up PCP Clinic Visit in 1 month.   Regina Eck, PharmD, BCPS Clinical Pharmacist, [place]  II Phone [phone]

## 2019-12-17 NOTE — Telephone Encounter (Signed)
Patient seen at 12:30pm today

## 2019-12-28 ENCOUNTER — Other Ambulatory Visit: Payer: Self-pay

## 2019-12-28 ENCOUNTER — Encounter: Payer: Self-pay | Admitting: Family Medicine

## 2019-12-28 ENCOUNTER — Ambulatory Visit (INDEPENDENT_AMBULATORY_CARE_PROVIDER_SITE_OTHER): Payer: Medicare Other | Admitting: Family Medicine

## 2019-12-28 VITALS — BP 150/60 | HR 60 | Ht 66.0 in | Wt 235.0 lb

## 2019-12-28 DIAGNOSIS — E1169 Type 2 diabetes mellitus with other specified complication: Secondary | ICD-10-CM

## 2019-12-28 DIAGNOSIS — E1159 Type 2 diabetes mellitus with other circulatory complications: Secondary | ICD-10-CM | POA: Diagnosis not present

## 2019-12-28 DIAGNOSIS — I700 Atherosclerosis of aorta: Secondary | ICD-10-CM

## 2019-12-28 DIAGNOSIS — E119 Type 2 diabetes mellitus without complications: Secondary | ICD-10-CM

## 2019-12-28 DIAGNOSIS — E1129 Type 2 diabetes mellitus with other diabetic kidney complication: Secondary | ICD-10-CM | POA: Diagnosis not present

## 2019-12-28 DIAGNOSIS — R809 Proteinuria, unspecified: Secondary | ICD-10-CM

## 2019-12-28 DIAGNOSIS — E785 Hyperlipidemia, unspecified: Secondary | ICD-10-CM

## 2019-12-28 DIAGNOSIS — I152 Hypertension secondary to endocrine disorders: Secondary | ICD-10-CM | POA: Diagnosis not present

## 2019-12-28 DIAGNOSIS — Z794 Long term (current) use of insulin: Secondary | ICD-10-CM

## 2019-12-28 LAB — CMP14+EGFR
ALT: 20 IU/L (ref 0–44)
AST: 27 IU/L (ref 0–40)
Albumin/Globulin Ratio: 1.4 (ref 1.2–2.2)
Albumin: 4.1 g/dL (ref 3.7–4.7)
Alkaline Phosphatase: 81 IU/L (ref 44–121)
BUN/Creatinine Ratio: 14 (ref 10–24)
BUN: 14 mg/dL (ref 8–27)
Bilirubin Total: 0.5 mg/dL (ref 0.0–1.2)
CO2: 22 mmol/L (ref 20–29)
Calcium: 9.4 mg/dL (ref 8.6–10.2)
Chloride: 101 mmol/L (ref 96–106)
Creatinine, Ser: 0.97 mg/dL (ref 0.76–1.27)
GFR calc Af Amer: 85 mL/min/{1.73_m2} (ref >59)
GFR calc non Af Amer: 74 mL/min/{1.73_m2} (ref >59)
Globulin, Total: 2.9 g/dL (ref 1.5–4.5)
Glucose: 180 mg/dL — ABNORMAL HIGH (ref 65–99)
Potassium: 4.5 mmol/L (ref 3.5–5.2)
Sodium: 140 mmol/L (ref 134–144)
Total Protein: 7 g/dL (ref 6.0–8.5)

## 2019-12-28 LAB — CBC WITH DIFFERENTIAL/PLATELET
Basophils Absolute: 0.1 10*3/uL (ref 0.0–0.2)
Basos: 1 %
EOS (ABSOLUTE): 0.1 10*3/uL (ref 0.0–0.4)
Eos: 2 %
Hematocrit: 39.4 % (ref 37.5–51.0)
Hemoglobin: 12.9 g/dL — ABNORMAL LOW (ref 13.0–17.7)
Immature Grans (Abs): 0 10*3/uL (ref 0.0–0.1)
Immature Granulocytes: 0 %
Lymphocytes Absolute: 2 10*3/uL (ref 0.7–3.1)
Lymphs: 29 %
MCH: 27.9 pg (ref 26.6–33.0)
MCHC: 32.7 g/dL (ref 31.5–35.7)
MCV: 85 fL (ref 79–97)
Monocytes Absolute: 0.5 10*3/uL (ref 0.1–0.9)
Monocytes: 8 %
Neutrophils Absolute: 4 10*3/uL (ref 1.4–7.0)
Neutrophils: 60 %
Platelets: 255 10*3/uL (ref 150–450)
RBC: 4.62 x10E6/uL (ref 4.14–5.80)
RDW: 14.9 % (ref 11.6–15.4)
WBC: 6.8 10*3/uL (ref 3.4–10.8)

## 2019-12-28 LAB — LIPID PANEL
Chol/HDL Ratio: 3.3 ratio (ref 0.0–5.0)
Cholesterol, Total: 118 mg/dL (ref 100–199)
HDL: 36 mg/dL — ABNORMAL LOW (ref >39)
LDL Chol Calc (NIH): 61 mg/dL (ref 0–99)
Triglycerides: 117 mg/dL (ref 0–149)
VLDL Cholesterol Cal: 21 mg/dL (ref 5–40)

## 2019-12-28 LAB — BAYER DCA HB A1C WAIVED: HB A1C (BAYER DCA - WAIVED): 7.6 % — ABNORMAL HIGH (ref <7.0)

## 2019-12-28 NOTE — Progress Notes (Signed)
BP (!) 150/60   Pulse 60   Ht '5\' 6"'  (1.676 m)   Wt 235 lb (106.6 kg)   SpO2 97%   BMI 37.93 kg/m    Subjective:   Patient ID: [name], male    DOB: Sep 13, 1940, 79 y.o.   MRN: [id number]  HPI: [name] is a 79 y.o. male presenting on 12/28/2019 for Medical Management of Chronic Issues and Diabetes   HPI Type 2 diabetes mellitus Patient comes in today for recheck of his diabetes. Patient has been currently taking Metformin and Lantus 56 and 1520 of NovoLog with meals. Patient is not currently on an ACE inhibitor/ARB because of allergy to angiotensin receptor blockers. Patient has seen an ophthalmologist this year. Patient denies any new issues with their feet. The symptom started onset as an adult CAD and microalbuminuria ARE RELATED TO DM   Hypertension, follows up with cardiology, recently had an aortic valve replacement  patient is currently on furosemide and Imdur, and their blood pressure today is 150/60. Patient denies any lightheadedness or dizziness. Patient denies headaches, blurred vision, chest pains, shortness of breath, or weakness. Denies any side effects from medication and is content with current medication.   Hyperlipidemia Patient is coming in for recheck of his hyperlipidemia. The patient is currently taking atorvastatin and fish oils. They deny any issues with myalgias or history of liver damage from it. They deny any focal numbness or weakness or chest pain.   Relevant past medical, surgical, family and social history reviewed and updated as indicated. Interim medical history since our last visit reviewed. Allergies and medications reviewed and updated.  Review of Systems  Constitutional: Negative for chills and fever.  Eyes: Negative for visual disturbance.  Respiratory: Negative for cough, shortness of breath and wheezing.   Cardiovascular: Negative for chest pain and leg swelling.  Musculoskeletal: Negative for back pain and gait problem.   Skin: Negative for rash.  Neurological: Negative for dizziness, weakness and light-headedness.  All other systems reviewed and are negative.   Per HPI unless specifically indicated above   Allergies as of 12/28/2019      Reactions   Ace Inhibitors Hives, Swelling, Rash   Rash,hives,tongue swelling   Angiotensin Receptor Blockers    Unknown reaction    Ciprofloxacin Other (See Comments)   confusion   Oxycodone-acetaminophen Other (See Comments)   Unknown reaction   Robaxin [methocarbamol] Other (See Comments)   Confusion    Toradol [ketorolac Tromethamine] Other (See Comments)   confusion   Valium Other (See Comments)   Hallucinations; confusion   Doxycycline Hives, Rash      Medication List       Accurate as of December 28, 2019  9:26 AM. If you have any questions, ask your nurse or doctor.        acetaminophen 650 MG CR tablet Commonly known as: TYLENOL Take 1,300 mg by mouth in the morning, at noon, and at bedtime.   ACETAMINOPHEN-BUTALBITAL 50-325 MG Tabs Take by mouth.   amLODipine 10 MG tablet Commonly known as: NORVASC Take 1 tablet (10 mg total) by mouth daily.   aspirin EC 81 MG tablet Take 81 mg by mouth at bedtime.   atorvastatin 40 MG tablet Commonly known as: LIPITOR Take 1 tablet (40 mg total) by mouth every evening.   butalbital-acetaminophen-caffeine 50-325-40 MG tablet Commonly known as: FIORICET Take 1 tablet by mouth 2 (two) times daily as needed for headache.   cetirizine 10 MG tablet Commonly  known as: ZYRTEC Take 10 mg by mouth daily with supper.   clopidogrel 75 MG tablet Commonly known as: PLAVIX Take 1 tablet (75 mg total) by mouth daily.   Dexcom G6 Receiver Devi USE TO CHECK BLOOD SUGAR UP TO 4 TIMES DAILY AS DIRECTED. 1 DEVICE PER YEAR. DX: E11.9   Dexcom G6 Sensor Misc USE TO CHECK BLOOD SUGAR UP TO 4 TIMES DAILY AS DIRECTED. CHANGE SENSOR EVERY 30 DAYS. DX: E11.9   Dexcom G6 Transmitter Misc USE TO CHECK BLOOD SUGAR  UP TO 4 TIMES DAILY AS DIRECTED. CHANGE TRANSMITTER EVERY 3 MONTHS. DX: E11.9   docusate sodium 100 MG capsule Commonly known as: COLACE Take 100 mg by mouth daily as needed for mild constipation or moderate constipation.   donepezil 5 MG tablet Commonly known as: ARICEPT Take 5 mg by mouth at bedtime.   famotidine 20 MG tablet Commonly known as: PEPCID TAKE 1 TABLET BY MOUTH  TWICE DAILY WITH MEALS   Fish Oil 1200 MG Caps Take 1,200-2,400 mg by mouth See admin instructions. Tale 1200 mg in the morning and 2400 mg in the evening   furosemide 20 MG tablet Commonly known as: LASIX Take 1 tablet (20 mg total) by mouth daily.   glucose blood test strip Test 4 times daily Dx E11.59   insulin lispro 100 UNIT/ML injection Commonly known as: HUMALOG Inject 0-20 Units into the skin 3 (three) times daily as needed for high blood sugar.   isosorbide mononitrate 30 MG 24 hr tablet Commonly known as: IMDUR TAKE 1 TABLET BY MOUTH IN  THE EVENING   Lantus SoloStar 100 UNIT/ML Solostar Pen Generic drug: insulin glargine INJECT SUBCUTANEOUSLY 52  UNITS AT BEDTIME   metFORMIN 500 MG 24 hr tablet Commonly known as: GLUCOPHAGE-XR TAKE 2 TABLETS BY MOUTH  TWICE DAILY   nitroGLYCERIN 0.4 MG SL tablet Commonly known as: NITROSTAT DISSOLVE ONE TABLET UNDER THE TONGUE EVERY 5 MINUTES AS NEEDED FOR CHEST PAIN.  DO NOT EXCEED A TOTAL OF 3 DOSES IN 15 MINUTES   PARoxetine 20 MG tablet Commonly known as: PAXIL TAKE 1 TABLET BY MOUTH  DAILY   tamsulosin 0.4 MG Caps capsule Commonly known as: FLOMAX TAKE 1 CAPSULE BY MOUTH  DAILY   Turmeric Curcumin 500 MG Caps Take 500 mg by mouth daily.   vitamin B-12 1000 MCG tablet Commonly known as: CYANOCOBALAMIN Take 2,000 mcg by mouth daily.   Vitamin D3 50 MCG (2000 UT) capsule Take 4,000 Units by mouth daily.        Objective:   BP (!) 150/60   Pulse 60   Ht '5\' 6"'  (1.676 m)   Wt 235 lb (106.6 kg)   SpO2 97%   BMI 37.93 kg/m   Wt  Readings from Last 3 Encounters:  12/28/19 235 lb (106.6 kg)  12/13/19 239 lb 6.4 oz (108.6 kg)  12/05/19 233 lb 6.4 oz (105.9 kg)    Physical Exam Vitals and nursing note reviewed.  Constitutional:      General: He is not in acute distress.    Appearance: He is well-developed and well-nourished. He is not diaphoretic.  Eyes:     General: No scleral icterus.    Extraocular Movements: EOM normal.     Conjunctiva/sclera: Conjunctivae normal.  Neck:     Thyroid: No thyromegaly.  Cardiovascular:     Rate and Rhythm: Normal rate and regular rhythm.     Pulses: Intact distal pulses.     Heart sounds: Normal  heart sounds. No murmur heard.   Pulmonary:     Effort: Pulmonary effort is normal. No respiratory distress.     Breath sounds: Normal breath sounds. No wheezing.  Musculoskeletal:        General: No edema. Normal range of motion.     Cervical back: Neck supple.  Lymphadenopathy:     Cervical: No cervical adenopathy.  Skin:    General: Skin is warm and dry.     Findings: No rash.  Neurological:     Mental Status: He is alert and oriented to person, place, and time.     Coordination: Coordination normal.  Psychiatric:        Mood and Affect: Mood and affect normal.        Behavior: Behavior normal.       Assessment & Plan:   Problem List Items Addressed This Visit      Cardiovascular and Mediastinum   Hypertension associated with diabetes ([place])   Relevant Orders   CMP14+EGFR   Aortic atherosclerosis ([place])   Relevant Orders   CBC with Differential/Platelet   CMP14+EGFR     Endocrine   Hyperlipidemia associated with type 2 diabetes mellitus ([place])   Relevant Orders   Lipid panel   Type 2 diabetes mellitus with circulatory disorder ([facility])   Microalbuminuria due to type 2 diabetes mellitus ([place])    Other Visit Diagnoses    Type 2 diabetes mellitus without complication, without long-term current use of insulin ([facility])    -  Primary   Relevant Orders   Bayer DCA  Hb A1c Waived   Microalbumin / creatinine urine ratio   CBC with Differential/Platelet   CMP14+EGFR   Lipid panel      Will check patient blood sugar, he did bring his Dexcom diet unable to get a print out of it because our pharmacist is not here today.  He says he has been running up today and is still creeping up despite not eating today.  He did take his insulin last night.  We will check A1c and blood work and follow-up from there. Follow up plan: Return in about 3 months (around 03/27/2020), or if symptoms worsen or fail to improve, for Diabetes and hypertension and class.  Counseling provided for all of the vaccine components Orders Placed This Encounter  Procedures  . Bayer DCA Hb A1c Waived  . Microalbumin / creatinine urine ratio    Caryl Pina, MD [facility] 12/28/2019, 9:26 AM

## 2019-12-29 LAB — MICROALBUMIN / CREATININE URINE RATIO
Creatinine, Urine: 137.9 mg/dL
Microalb/Creat Ratio: 631 mg/g creat — ABNORMAL HIGH (ref 0–29)
Microalbumin, Urine: 870.1 ug/mL

## 2019-12-31 ENCOUNTER — Ambulatory Visit: Payer: Medicare Other | Admitting: Family Medicine

## 2020-01-09 ENCOUNTER — Other Ambulatory Visit (HOSPITAL_COMMUNITY): Payer: Medicare Other

## 2020-01-10 ENCOUNTER — Ambulatory Visit (HOSPITAL_COMMUNITY): Payer: Medicare Other | Attending: Cardiology

## 2020-01-10 ENCOUNTER — Ambulatory Visit: Payer: Medicare Other | Admitting: Physician Assistant

## 2020-01-10 ENCOUNTER — Encounter: Payer: Self-pay | Admitting: Physician Assistant

## 2020-01-10 ENCOUNTER — Other Ambulatory Visit: Payer: Self-pay

## 2020-01-10 VITALS — BP 126/70 | HR 70 | Ht 66.0 in | Wt 237.0 lb

## 2020-01-10 DIAGNOSIS — I712 Thoracic aortic aneurysm, without rupture, unspecified: Secondary | ICD-10-CM

## 2020-01-10 DIAGNOSIS — I350 Nonrheumatic aortic (valve) stenosis: Secondary | ICD-10-CM

## 2020-01-10 DIAGNOSIS — I5032 Chronic diastolic (congestive) heart failure: Secondary | ICD-10-CM

## 2020-01-10 DIAGNOSIS — I10 Essential (primary) hypertension: Secondary | ICD-10-CM

## 2020-01-10 DIAGNOSIS — I2510 Atherosclerotic heart disease of native coronary artery without angina pectoris: Secondary | ICD-10-CM

## 2020-01-10 DIAGNOSIS — Z952 Presence of prosthetic heart valve: Secondary | ICD-10-CM | POA: Diagnosis not present

## 2020-01-10 DIAGNOSIS — Z9861 Coronary angioplasty status: Secondary | ICD-10-CM

## 2020-01-10 LAB — ECHOCARDIOGRAM COMPLETE
AR max vel: 1.53 cm2
AV Area VTI: 1.67 cm2
AV Area mean vel: 1.61 cm2
AV Mean grad: 12 mmHg
AV Peak grad: 22.5 mmHg
Ao pk vel: 2.37 m/s
Area-P 1/2: 3.27 cm2
S' Lateral: 3.8 cm

## 2020-01-10 MED ORDER — AMLODIPINE BESYLATE 10 MG PO TABS
10.0000 mg | ORAL_TABLET | Freq: Every day | ORAL | 1 refills | Status: DC
Start: 2020-01-10 — End: 2020-05-27

## 2020-01-10 MED ORDER — CLOPIDOGREL BISULFATE 75 MG PO TABS
75.0000 mg | ORAL_TABLET | Freq: Every day | ORAL | 1 refills | Status: DC
Start: 2020-01-10 — End: 2020-06-12

## 2020-01-10 MED ORDER — FUROSEMIDE 20 MG PO TABS: 20.0000 mg | ORAL_TABLET | Freq: Every day | ORAL | 0 refills | Status: DC | PRN

## 2020-01-10 MED ORDER — ATORVASTATIN CALCIUM 40 MG PO TABS
40.0000 mg | ORAL_TABLET | Freq: Every evening | ORAL | 1 refills | Status: DC
Start: 2020-01-10 — End: 2020-05-27

## 2020-01-10 NOTE — Patient Instructions (Signed)
Medication Instructions:   TAKE YOUR LASIX 20 MG BY MOUTH DAILY AS NEEDED FOR LOWER EXTREMITY SWELLING OR WEIGHT GAIN.  PLEASE CONTINUE ALL YOUR OTHER MEDICATIONS AS PRESCRIBED.  PLEASE STOP TAKING YOUR PLAVIX ON 06/02/20.  *If you need a refill on your cardiac medications before your next appointment, please call your pharmacy*    Follow-Up:  WITH DR. MCDOWELL AS SCHEDULED ON 02/07/20

## 2020-01-10 NOTE — Progress Notes (Addendum)
[facility]                                       Cardiology Office Note    Date:  01/11/2020   ID:  [name], DOB 07-23-1940, MRN [id number]  PCP:  Dettinger, Fransisca Kaufmann, MD  Cardiologist:  Rozann Lesches, MD / Dr. Angelena Form & Dr. Cyndia Bent (TAVR)  CC: 1 month s/p TAVR  History of Present Illness:  [name] is a 79 y.o. male with a history of CADs/p BMS to RCA (2006) and more recent PCI to LAD (11/2019), HTN, GERD,HLD, morbid obesity, sleep apnea,IDDM andmoderate to severeaortic stenosis s/p TAVR (12/04/19) who presents to clinic for follow up.  [name] had a 6 to 97-month history of progressive exertional shortness of breath and fatigue which was worsened recently and was markedly limiting his ability to be active. He had no chest pain or pressure. He has had some swelling in his feet. He reported some orthopnea. He has been followed for moderate aortic stenosis and had an echocardiogram on 10/31/19 showing a mean gradient across the aortic valve of 23.2 mmHg and an aortic valve area of 0.98 cm. Dimensionless index of 0.28. Cardiac catheterization on 11/14/2019 showed severe multivessel coronary disease. The LAD hada 99% mid vessel stenosis and a second 90% stenosis further downstream to the takeoff of a second diagonal branchthat were successfully treated with PCI/DES.The left circumflex has a large third marginal branch that had 80% stenosis in the lateral subbranch. The right coronary artery is smaller and nondominant with 70% and 90% stenoses. The mean gradient across aortic valve was 26.6 mmHg the peak to peak gradient of 31 mmHg  The patient was evaluated by the multidisciplinary valve team and underwent successful TAVR with a 26 mm Edwards S3U THV via the TF approach on 12/04/19. Post op echo showed EF 60%, normally functioning TAVR with a mean gradient of 13 mmHg and trivial intravalvular leak. He was  discharged on aspirin and plavix. He has a big clinical improvement since PCI and TAVR. At last visit we decreased lasix due to bothersome urinary frequency.  Today he presents to clinic for follow up. He has stopped lasix altogether and doing well with no swelling or shortness of breath. No CP or SOB. No LE edema, orthopnea or PND. No dizziness or syncope. No blood in stool or urine. No palpitations.      Past Medical History:  Diagnosis Date  . Anxiety   . Aortic atherosclerosis ([place]) 12/19/2017  . Arthritis   . Bursitis of hip   . Cervical spondylosis   . Chronic headaches   . Coronary atherosclerosis of native coronary artery    BMS nondominant RCA 12/2004  . Essential hypertension   . GERD (gastroesophageal reflux disease)   . History of adenomatous polyp of colon   . History of kidney stones   . History of MI (myocardial infarction) 12/2004  . History of viral meningitis 02/28/2018   December 2019  . Hyperlipidemia   . Internal hemorrhoids   . Low back pain   . Nocturia more than twice per night   . OSA (obstructive sleep apnea)   . Spinal stenosis   . Type 2 diabetes mellitus treated with insulin ([place])   . Wears dentures    Upper    Past Surgical History:  Procedure  Laterality Date  . CARDIAC CATHETERIZATION  06-14-2004  dr Lia Foyer   total occlusion mD1, RI 30-40%, mRCA nondominant hazy 80% and scattered 30-40%,  ef 71% (medical mangement)  . CARDIOVASCULAR STRESS TEST  09-17-2015   dr Domenic Polite   Low risk nuclear study w/ reversible mild small anteroapical defect,  normal LV function and wall motion, nuclear stress ef 61%  . CATARACT EXTRACTION W/ INTRAOCULAR LENS IMPLANT Right 07/2014  . COLONOSCOPY  09/10/2008   normal  . CORONARY ANGIOPLASTY WITH STENT PLACEMENT  12-17-2004   dr benismhon/ dr Olevia Perches   nonobstructive cad LAD and LCFx,  BMS x1 to total occlusion RCA   . CORONARY ATHERECTOMY  11/22/2019  . CORONARY ATHERECTOMY N/A 11/22/2019   Procedure: CORONARY  ATHERECTOMY;  Surgeon: Burnell Blanks, MD;  Location: [facility];  Service: Cardiovascular;  Laterality: N/A;  . CYSTOSCOPY W/ URETEROSCOPY W/ LITHOTRIPSY  08/2010  . ERCP  03/14/2008  . EYE SURGERY Bilateral    cataracts  . LAPAROSCOPIC CHOLECYSTECTOMY  05/2012  . LEFT HEART CATHETERIZATION WITH CORONARY ANGIOGRAM N/A 05/14/2011   Procedure: LEFT HEART CATHETERIZATION WITH CORONARY ANGIOGRAM;  Surgeon: Sherren Mocha, MD;  Location: [facility] C[facility];  Service: Cardiovascular;  Laterality: N/A;    non-obstructive LM, LAD, LCFx, patent RCA stent  . LUMBAR LAMINECTOMY/DECOMPRESSION MICRODISCECTOMY N/A 02/24/2017   Procedure: Microlumbar decompression L4-5, L5-S1;  Surgeon: Susa Day, MD;  Location: [facility];  Service: Orthopedics;  Laterality: N/A;  120 mins  . RIGHT/LEFT HEART CATH AND CORONARY ANGIOGRAPHY N/A 11/14/2019   Procedure: RIGHT/LEFT HEART CATH AND CORONARY ANGIOGRAPHY;  Surgeon: Burnell Blanks, MD;  Location: [place] [facility];  Service: Cardiovascular;  Laterality: N/A;  . ROTATOR CUFF REPAIR Right 03/2002  . ROTATOR CUFF REPAIR Left 12/11/2015  . TEE WITHOUT CARDIOVERSION N/A 12/04/2019   Procedure: TRANSESOPHAGEAL ECHOCARDIOGRAM (TEE);  Surgeon: Burnell Blanks, MD;  Location: [place] [facility];  Service: Open Heart Surgery;  Laterality: N/A;  . TOTAL HIP ARTHROPLASTY Right 12/20/2008  . TOTAL HIP ARTHROPLASTY Left 07/05/2018   Procedure: TOTAL HIP ARTHROPLASTY ANTERIOR APPROACH;  Surgeon: Gaynelle Arabian, MD;  Location: [facility];  Service: Orthopedics;  Laterality: Left;  141min  . TOTAL KNEE ARTHROPLASTY Bilateral left 12-11-2003/  right 07-31-2004   dr Wynelle Link [facility]  . TRANSCATHETER AORTIC VALVE REPLACEMENT, TRANSFEMORAL N/A 12/04/2019   Procedure: TRANSCATHETER AORTIC VALVE REPLACEMENT, TRANSFEMORAL;  Surgeon: Burnell Blanks, MD;  Location: [place] [facility];  Service: Open Heart Surgery;  Laterality: N/A;  . UPPER GASTROINTESTINAL ENDOSCOPY   01/08/2008   bx, inlet patch, duodenitis  . YAG LASER APPLICATION Right [id number]   Procedure: YAG LASER APPLICATION;  Surgeon: Williams Che, MD;  Location: [facility];  Service: Ophthalmology;  Laterality: Right;    Current Medications: Outpatient Medications Prior to Visit  Medication Sig Dispense Refill  . acetaminophen (TYLENOL) 650 MG CR tablet Take 1,300 mg by mouth in the morning, at noon, and at bedtime.    . ACETAMINOPHEN-BUTALBITAL 50-325 MG TABS Take by mouth.    Marland Kitchen aspirin EC 81 MG tablet Take 81 mg by mouth at bedtime.     . butalbital-acetaminophen-caffeine (FIORICET) 50-325-40 MG tablet Take 1 tablet by mouth 2 (two) times daily as needed for headache.    . cetirizine (ZYRTEC) 10 MG tablet Take 10 mg by mouth daily with supper.     . Cholecalciferol (VITAMIN D3) 50 MCG (2000 UT) capsule Take 4,000 Units by mouth daily.     Marland Kitchen  Continuous Blood Gluc Receiver (DEXCOM G6 RECEIVER) DEVI USE TO CHECK BLOOD SUGAR UP TO 4 TIMES DAILY AS DIRECTED. 1 DEVICE PER YEAR. DX: E11.9 1 each 1  . Continuous Blood Gluc Sensor (DEXCOM G6 SENSOR) MISC USE TO CHECK BLOOD SUGAR UP TO 4 TIMES DAILY AS DIRECTED. CHANGE SENSOR EVERY 30 DAYS. DX: E11.9 3 each 4  . Continuous Blood Gluc Transmit (DEXCOM G6 TRANSMITTER) MISC USE TO CHECK BLOOD SUGAR UP TO 4 TIMES DAILY AS DIRECTED. CHANGE TRANSMITTER EVERY 3 MONTHS. DX: E11.9 1 each 12  . docusate sodium (COLACE) 100 MG capsule Take 100 mg by mouth daily as needed for mild constipation or moderate constipation.     Marland Kitchen donepezil (ARICEPT) 5 MG tablet Take 5 mg by mouth at bedtime.    . famotidine (PEPCID) 20 MG tablet TAKE 1 TABLET BY MOUTH  TWICE DAILY WITH MEALS 180 tablet 0  . glucose blood test strip Test 4 times daily Dx E11.59 400 each 3  . insulin lispro (HUMALOG) 100 UNIT/ML injection Inject 0-20 Units into the skin 3 (three) times daily as needed for high blood sugar.    . isosorbide mononitrate (IMDUR) 30 MG 24 hr tablet TAKE 1 TABLET BY MOUTH IN  THE  EVENING 90 tablet 1  . LANTUS SOLOSTAR 100 UNIT/ML Solostar Pen INJECT SUBCUTANEOUSLY 52  UNITS AT BEDTIME 60 mL 3  . metFORMIN (GLUCOPHAGE-XR) 500 MG 24 hr tablet TAKE 2 TABLETS BY MOUTH  TWICE DAILY 360 tablet 0  . nitroGLYCERIN (NITROSTAT) 0.4 MG SL tablet DISSOLVE ONE TABLET UNDER THE TONGUE EVERY 5 MINUTES AS NEEDED FOR CHEST PAIN.  DO NOT EXCEED A TOTAL OF 3 DOSES IN 15 MINUTES 25 tablet 3  . Omega-3 Fatty Acids (FISH OIL) 1200 MG CAPS Take 1,200-2,400 mg by mouth See admin instructions. Tale 1200 mg in the morning and 2400 mg in the evening    . PARoxetine (PAXIL) 20 MG tablet TAKE 1 TABLET BY MOUTH  DAILY 90 tablet 0  . tamsulosin (FLOMAX) 0.4 MG CAPS capsule TAKE 1 CAPSULE BY MOUTH  DAILY 90 capsule 0  . Turmeric Curcumin 500 MG CAPS Take 500 mg by mouth daily.    . vitamin B-12 (CYANOCOBALAMIN) 1000 MCG tablet Take 2,000 mcg by mouth daily.    Marland Kitchen amLODipine (NORVASC) 10 MG tablet Take 1 tablet (10 mg total) by mouth daily. 30 tablet 11  . atorvastatin (LIPITOR) 40 MG tablet Take 1 tablet (40 mg total) by mouth every evening. 30 tablet 11  . clopidogrel (PLAVIX) 75 MG tablet Take 1 tablet (75 mg total) by mouth daily. 30 tablet 11  . furosemide (LASIX) 20 MG tablet Take 1 tablet (20 mg total) by mouth daily. (Patient not taking: Reported on 01/10/2020) 90 tablet 3   No facility-administered medications prior to visit.     Allergies:   Ace inhibitors, Angiotensin receptor blockers, Ciprofloxacin, Oxycodone-acetaminophen, Robaxin [methocarbamol], Toradol [ketorolac tromethamine], Valium, and Doxycycline   Social History   Socioeconomic History  . Marital status: Married    Spouse name: Dione   . Number of children: 1  . Years of education: HS  . Highest education level: High school graduate  Occupational History  . Occupation: RETIRED    Employer: [employer]    Comment: Power plant  Tobacco Use  . Smoking status: Former Smoker    Packs/day: 1.00    Years: 20.00    Pack  years: 20.00    Types: Cigarettes    Start date: 01/18/1963  Quit date: 08/11/1988    Years since quitting: 31.4  . Smokeless tobacco: Former Systems developer    Types: Chew    Quit date: 01/11/1989  . Tobacco comment: chewed 1 pack tobacco/day for 15 years  Vaping Use  . Vaping Use: Never used  Substance and Sexual Activity  . Alcohol use: No    Alcohol/week: 0.0 standard drinks  . Drug use: No  . Sexual activity: Yes  Other Topics Concern  . Not on file  Social History Narrative   No regular exercise      Daily caffeine use: 3 cups   Patient is right handed.    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     ROS:   Please see the history of present illness.    ROS All other systems reviewed and are negative.   PHYSICAL EXAM:   VS:  BP 126/70   Pulse 70   Ht 5\' 6"  (1.676 m)   Wt 237 lb (107.5 kg)   SpO2 94%   BMI 38.25 kg/m    GEN: Well nourished, well developed, in no acute distress, obese HEENT: normal Neck: no JVD or masses Cardiac: RRR; soft flow murmur. No rubs, or gallops,no edema  Respiratory:  clear to auscultation bilaterally, normal work of breathing GI: soft, nontender, nondistended, + BS MS: no deformity or atrophy Skin: warm and dry, no rash.  Groin sites clear without hematoma or ecchymosis Neuro:  Alert and Oriented x 3, Strength and sensation are  intact Psych: euthymic mood, full affect   Wt Readings from Last 3 Encounters:  01/10/20 237 lb (107.5 kg)  12/28/19 235 lb (106.6 kg)  12/13/19 239 lb 6.4 oz (108.6 kg)      Studies/Labs Reviewed:   EKG:  EKG is NOT ordered today. Recent Labs: 11/30/2019: B Natriuretic Peptide 23.4 12/05/2019: Magnesium 1.7 12/28/2019: ALT 20; BUN 14; Creatinine, Ser 0.97; Hemoglobin 12.9; Platelets 255; Potassium 4.5; Sodium 140   Lipid Panel    Component Value Date/Time   CHOL 118 12/28/2019 0951   CHOL 139 06/02/2012 1007   TRIG 117 12/28/2019 0951   TRIG 185 (H) 04/10/2013 0937   TRIG 107 06/02/2012 1007   HDL 36 (L) 12/28/2019 0951   HDL 34 (L) 04/10/2013 0937   HDL 40 06/02/2012 1007   CHOLHDL 3.3 12/28/2019 0951   CHOLHDL 3.8 11/16/2019 0206   VLDL 25 11/16/2019 0206   LDLCALC 61 12/28/2019 0951   LDLCALC 62 04/10/2013 0937   LDLCALC 78 06/02/2012 1007    Additional studies/ records that were reviewed today include:  TAVR: 12/04/19  Procedure:   Transcatheter Aortic Valve Replacement - Transfemoral Approach Edwards Sapien 3 THV (size 51mm, model [id number], serial [id number])  Co-Surgeons:Christopher Angelena Form, MD and [name], MD   Anesthesiologist:Ossey  Echocardiographer:Croitoru  Pre-operative Echo  Findings: ? Severe aortic stenosis ? Normalleft ventricular systolic function  Post-operative Echo Findings: ? Noparavalvular leak ? Normalleft ventricular systolic function  ___________________  Echo 12/05/19 IMPRESSIONS  1. Left ventricular ejection fraction, by estimation, is 60 to 65%. The  left ventricle has normal function. The left ventricle has no regional  wall motion abnormalities. There is mild concentric left ventricular  hypertrophy.  2. Right ventricular systolic function is normal. The right ventricular  size is normal.  3. Left atrial size  was severely dilated.  4. The mitral valve is normal in structure. Trivial mitral valve  regurgitation. No evidence of mitral stenosis.  5. There is trivial aortic insufficiency at the anterior aspect of the  prosthesis. This is close to the annulus, but appears to be intravalvular,  rather than a perivalvular leak. The aortic valve has been  repaired/replaced. Aortic valve regurgitation is  trivial. No aortic stenosis is present. There is a 26 mm Sapien  prosthetic (TAVR) valve present in the aortic position. Procedure Date:  12/04/2019. Aortic valve mean gradient measures 13.0 mmHg. Aortic valve  Vmax measures 2.36 m/s.  6. The inferior vena cava is normal in size with greater than 50%  respiratory variability, suggesting right atrial pressure of 3 mmHg.   ____________________  Echo 01/10/20 IMPRESSIONS    1. Left ventricular ejection fraction, by estimation, is 60 to 65%. The  left ventricle has normal function. The left ventricle has no regional  wall motion abnormalities. The left ventricular internal cavity size was  mildly dilated. Left ventricular  diastolic parameters are consistent with Grade I diastolic dysfunction  (impaired relaxation).  2. Right ventricular systolic function is normal. The right ventricular  size is normal.  3. The mitral valve is degenerative. Trivial mitral valve regurgitation.  No evidence of mitral stenosis.  4. The aortic valve has been repaired/replaced. Aortic valve  regurgitation is not visualized. No aortic stenosis is present. There is a  26 mm Sapien prosthetic (TAVR) valve present in the aortic position.  Procedure Date: 12/04/19. Echo findings are  consistent with normal structure and function of the aortic valve  prosthesis. Aortic valve area, by VTI measures 1.67 cm. Aortic valve mean  gradient measures 12.0 mmHg. Aortic valve Vmax measures 2.37 m/s. DI 0.34.  5. Aortic dilatation noted. There is mild dilatation of the  aortic root,  measuring 43 mm. There is mild dilatation of the ascending aorta,  measuring 43 mm.  6. The inferior vena cava is normal in size with greater than 50%  respiratory variability, suggesting right atrial pressure of 3 mmHg.   Comparison(s): 12/05/19 EF 60-65%. AV 16mmHg mean PG, 41mmHg peak PG. No  significant change from prior echo.   ASSESSMENT & PLAN:   Severe AS s/p TAVR: echo today shows EF 60%, normally functioning TAVR with a mean gradient of 13 mm hg and no PVL. He understands the need for ongoing SBE prophylaxis and gets Abx from his dentist. He has NYHA class II symptoms. Continue on aspirin and plavix. He can discontinue plavix after 6 months (05/2020). I will see him back in 1 year with echo.   Chronic diastolic CHF: appears euvolemic. He is now off the lasix completely but doing well. I will change lasix to PRN   CAD: s/p recent PCI. Continue DAPT and statin. Will stop plavix after 6 months out from TAVR and PCI  HTN: Bp well controlled today. No changes made  TAA: mild dilation of asc aorta at 50mm. Continue to  follow over time.   Medication Adjustments/Labs and Tests Ordered: Current medicines are reviewed at length with the patient today.  Concerns regarding medicines are outlined above.  Medication changes, Labs and Tests ordered today are listed in the Patient Instructions below. Patient Instructions  Medication Instructions:   TAKE YOUR LASIX 20 MG BY MOUTH DAILY AS NEEDED FOR LOWER EXTREMITY SWELLING OR WEIGHT GAIN.  PLEASE CONTINUE ALL YOUR OTHER MEDICATIONS AS PRESCRIBED.  PLEASE STOP TAKING YOUR PLAVIX ON 06/02/20.  *If you need a refill on your cardiac medications before your next appointment, please call your pharmacy*    Follow-Up:  WITH DR. MCDOWELL AS SCHEDULED ON 02/07/20       Signed, Angelena Form, PA-C  01/11/2020 10:00 AM    [address], [place],   [address] Phone: [phone];  Fax: [phone]

## 2020-01-18 DIAGNOSIS — Z794 Long term (current) use of insulin: Secondary | ICD-10-CM | POA: Diagnosis not present

## 2020-01-18 DIAGNOSIS — E119 Type 2 diabetes mellitus without complications: Secondary | ICD-10-CM | POA: Diagnosis not present

## 2020-01-29 ENCOUNTER — Encounter: Payer: Self-pay | Admitting: Dermatology

## 2020-01-29 ENCOUNTER — Other Ambulatory Visit: Payer: Self-pay

## 2020-01-29 ENCOUNTER — Ambulatory Visit: Payer: Medicare Other | Admitting: Dermatology

## 2020-01-29 DIAGNOSIS — L729 Follicular cyst of the skin and subcutaneous tissue, unspecified: Secondary | ICD-10-CM

## 2020-01-29 DIAGNOSIS — L820 Inflamed seborrheic keratosis: Secondary | ICD-10-CM | POA: Diagnosis not present

## 2020-01-29 DIAGNOSIS — L821 Other seborrheic keratosis: Secondary | ICD-10-CM | POA: Diagnosis not present

## 2020-01-29 DIAGNOSIS — Z1283 Encounter for screening for malignant neoplasm of skin: Secondary | ICD-10-CM

## 2020-01-29 DIAGNOSIS — L080 Pyoderma: Secondary | ICD-10-CM

## 2020-01-29 DIAGNOSIS — D485 Neoplasm of uncertain behavior of skin: Secondary | ICD-10-CM | POA: Diagnosis not present

## 2020-01-29 DIAGNOSIS — L281 Prurigo nodularis: Secondary | ICD-10-CM | POA: Diagnosis not present

## 2020-01-29 DIAGNOSIS — G4733 Obstructive sleep apnea (adult) (pediatric): Secondary | ICD-10-CM | POA: Diagnosis not present

## 2020-01-29 DIAGNOSIS — L918 Other hypertrophic disorders of the skin: Secondary | ICD-10-CM

## 2020-01-29 MED ORDER — MUPIROCIN 2 % EX OINT: 1.0000 "application " | TOPICAL_OINTMENT | Freq: Two times a day (BID) | CUTANEOUS | 1 refills | Status: DC

## 2020-01-29 NOTE — Progress Notes (Signed)
   Follow-Up Visit   Subjective  [name] is a 80 y.o. male who presents for the following: Annual Exam (No new concerns).  Annual skin check Location: Most concern with spot on the left thigh and the right upper sideburn areas. Duration:  Quality:  Associated Signs/Symptoms: Modifying Factors:  Severity:  Timing: Context:   Objective  Well appearing patient in no apparent distress; mood and affect are within normal limits. Objective  Head - Anterior (Face), Mid Back: All skin waist up plus legs examined; no atypical moles or melanoma.  Objective  Right Temporal Scalp: 6 mm crusted pink papule; CIS versus I SK     Objective  Left Breast, Right Upper Eyelid: Noninflamed pedunculated 1 to 3 mm papules  Objective  Mid Back, Right Breast, Right Nasal Sidewall: Tan to brown 3 to 6 mm flattopped textured papules  Objective  Left Thigh - Anterior: Lichenified 5 mm flesh-colored papule.  This may have begun as a keratosis, but [name] is well aware that he repeatedly picks the spot.   All skin waist up examined.  Plus legs.   Assessment & Plan    Skin exam for malignant neoplasm (2) Head - Anterior (Face); Mid Back  Pyoderma Left Hip (side) - Posterior  Anaerobic and Aerobic Culture - Left Hip (side) - Posterior  mupirocin ointment (BACTROBAN) 2 % - Left Hip (side) - Posterior  Neoplasm of uncertain behavior of skin Right Temporal Scalp  Skin / nail biopsy Type of biopsy: tangential   Informed consent: discussed and consent obtained   Timeout: patient name, date of birth, surgical site, and procedure verified   Procedure prep:  Patient was prepped and draped in usual sterile fashion (Non sterile) Prep type:  Chlorhexidine Anesthesia: the lesion was anesthetized in a standard fashion   Anesthetic:  1% lidocaine w/ epinephrine 1-100,000 local infiltration Instrument used: flexible razor blade   Outcome: patient tolerated procedure well    Post-procedure details: wound care instructions given    Specimen 1 - Surgical pathology Differential Diagnosis: bcc vs scc  Check Margins: No  Cyst of skin Right Breast  Skin tags, multiple acquired (2) Left Breast; Right Upper Eyelid  Patient may choose to remove these in the future.  Seborrheic keratosis (3) Right Breast; Mid Back; Right Nasal Sidewall  Leave if stable  Prurigo nodularis Left Thigh - Anterior  Recheck as needed clinical change.     I, Lavonna Monarch, MD, have reviewed all documentation for this visit.  The documentation on 01/29/20 for the exam, diagnosis, procedures, and orders are all accurate and complete.

## 2020-01-29 NOTE — Patient Instructions (Signed)

## 2020-01-31 ENCOUNTER — Other Ambulatory Visit: Payer: Self-pay | Admitting: Family Medicine

## 2020-02-01 ENCOUNTER — Telehealth: Payer: Self-pay

## 2020-02-01 NOTE — Telephone Encounter (Signed)
-----   Message from Stuart Tafeen, MD sent at 01/31/2020  9:34 PM EST ----- Please inform patient that the culture did grow a bacteria but it is uncertain whether this is the cause of the skin ulcer or just a secondary growth.  He should definitely continue the topical mupirocin twice daily for 10 days and then contact us with his status report. 

## 2020-02-01 NOTE — Telephone Encounter (Signed)
Phone call to patient with his culture results. Voicemail left for patient to give the office a call back. 

## 2020-02-04 LAB — ANAEROBIC AND AEROBIC CULTURE
MICRO NUMBER:: 11428509
MICRO NUMBER:: 11428510
SPECIMEN QUALITY:: ADEQUATE
SPECIMEN QUALITY:: ADEQUATE

## 2020-02-05 ENCOUNTER — Other Ambulatory Visit: Payer: Self-pay | Admitting: *Deleted

## 2020-02-05 MED ORDER — ONETOUCH ULTRA VI STRP: ORAL_STRIP | 3 refills | Status: DC

## 2020-02-05 NOTE — Telephone Encounter (Signed)
Fax from [facility] will only cover TID BS testing for insulin patients OneTouch TS corrected and resent

## 2020-02-06 ENCOUNTER — Telehealth: Payer: Self-pay | Admitting: Dermatology

## 2020-02-06 NOTE — Progress Notes (Signed)
Cardiology Office Note  Date: 02/07/2020   ID: [name], [name] 01-Oct-1940, MRN [id number]  PCP:  Dettinger, Fransisca Kaufmann, MD  Cardiologist:  Rozann Lesches, MD Electrophysiologist:  None   Chief Complaint  Patient presents with  . Cardiac follow-up    History of Present Illness: [name] is an 80 y.o. male last seen in October 2021.  Interval follow-up noted in the valve clinic, recently seen in December 2021, I reviewed the note.  He tells me that he has been doing well, NYHA class II dyspnea with typical activities, no exertional chest pain.  Feels like he has more energy.  He has had some trouble with lower back pain and left hip pain, has a visit scheduled with orthopedics soon.  He underwent cardiac catheterization in November 2021 that revealed multivessel CAD.  He ultimately underwent placement of DES x2 to the mid LAD and was otherwise managed medically with subsequent TAVR (26 mm Edwards S3U THV) for management of aortic stenosis. Recent follow-up echocardiogram is noted below.  Blood pressure is up today, he has not yet taken his morning medications.  Last blood pressure check was normal at healthcare encounter.  I did ask him to check his blood pressure periodically with automatic cuff at home.  He states that he has been compliant with his medications on a daily basis.  Past Medical History:  Diagnosis Date  . Anxiety   . Aortic atherosclerosis ([place]) 12/19/2017  . Aortic stenosis    26 mm Edwards S3U THV November 2021  . Arthritis   . Bursitis of hip   . Cervical spondylosis   . Chronic headaches   . Coronary atherosclerosis of native coronary artery    BMS nondominant RCA 12/2004, DES x2 to mid LAD 11/2019  . Essential hypertension   . GERD (gastroesophageal reflux disease)   . History of adenomatous polyp of colon   . History of kidney stones   . History of MI (myocardial infarction) 12/2004  . History of viral meningitis 02/28/2018   December 2019   . Hyperlipidemia   . Internal hemorrhoids   . Low back pain   . Nocturia more than twice per night   . OSA (obstructive sleep apnea)   . Spinal stenosis   . Type 2 diabetes mellitus treated with insulin ([place])   . Wears dentures    Upper    Past Surgical History:  Procedure Laterality Date  . CARDIAC CATHETERIZATION  06-14-2004  dr Lia Foyer   total occlusion mD1, RI 30-40%, mRCA nondominant hazy 80% and scattered 30-40%,  ef 71% (medical mangement)  . CARDIOVASCULAR STRESS TEST  09-17-2015   dr Domenic Polite   Low risk nuclear study w/ reversible mild small anteroapical defect,  normal LV function and wall motion, nuclear stress ef 61%  . CATARACT EXTRACTION W/ INTRAOCULAR LENS IMPLANT Right 07/2014  . COLONOSCOPY  09/10/2008   normal  . CORONARY ANGIOPLASTY WITH STENT PLACEMENT  12-17-2004   dr benismhon/ dr Olevia Perches   nonobstructive cad LAD and LCFx,  BMS x1 to total occlusion RCA   . CORONARY ATHERECTOMY  11/22/2019  . CORONARY ATHERECTOMY N/A 11/22/2019   Procedure: CORONARY ATHERECTOMY;  Surgeon: Burnell Blanks, MD;  Location: [facility];  Service: Cardiovascular;  Laterality: N/A;  . CYSTOSCOPY W/ URETEROSCOPY W/ LITHOTRIPSY  08/2010  . ERCP  03/14/2008  . EYE SURGERY Bilateral    cataracts  . LAPAROSCOPIC CHOLECYSTECTOMY  05/2012  . LEFT HEART CATHETERIZATION WITH  CORONARY ANGIOGRAM N/A 05/14/2011   Procedure: LEFT HEART CATHETERIZATION WITH CORONARY ANGIOGRAM;  Surgeon: Sherren Mocha, MD;  Location: [facility] C[facility];  Service: Cardiovascular;  Laterality: N/A;    non-obstructive LM, LAD, LCFx, patent RCA stent  . LUMBAR LAMINECTOMY/DECOMPRESSION MICRODISCECTOMY N/A 02/24/2017   Procedure: Microlumbar decompression L4-5, L5-S1;  Surgeon: Susa Day, MD;  Location: [facility];  Service: Orthopedics;  Laterality: N/A;  120 mins  . RIGHT/LEFT HEART CATH AND CORONARY ANGIOGRAPHY N/A 11/14/2019   Procedure: RIGHT/LEFT HEART CATH AND CORONARY ANGIOGRAPHY;  Surgeon: Burnell Blanks, MD;  Location: [facility];  Service: Cardiovascular;  Laterality: N/A;  . ROTATOR CUFF REPAIR Right 03/2002  . ROTATOR CUFF REPAIR Left 12/11/2015  . TEE WITHOUT CARDIOVERSION N/A 12/04/2019   Procedure: TRANSESOPHAGEAL ECHOCARDIOGRAM (TEE);  Surgeon: Burnell Blanks, MD;  Location: [facility];  Service: Open Heart Surgery;  Laterality: N/A;  . TOTAL HIP ARTHROPLASTY Right 12/20/2008  . TOTAL HIP ARTHROPLASTY Left 07/05/2018   Procedure: TOTAL HIP ARTHROPLASTY ANTERIOR APPROACH;  Surgeon: Gaynelle Arabian, MD;  Location: [facility];  Service: Orthopedics;  Laterality: Left;  134min  . TOTAL KNEE ARTHROPLASTY Bilateral left 12-11-2003/  right 07-31-2004   dr Wynelle Link [facility]  . TRANSCATHETER AORTIC VALVE REPLACEMENT, TRANSFEMORAL N/A 12/04/2019   Procedure: TRANSCATHETER AORTIC VALVE REPLACEMENT, TRANSFEMORAL;  Surgeon: Burnell Blanks, MD;  Location: [facility];  Service: Open Heart Surgery;  Laterality: N/A;  . UPPER GASTROINTESTINAL ENDOSCOPY  01/08/2008   bx, inlet patch, duodenitis  . YAG LASER APPLICATION Right 08/05/3662   Procedure: YAG LASER APPLICATION;  Surgeon: Williams Che, MD;  Location: [facility];  Service: Ophthalmology;  Laterality: Right;    Current Outpatient Medications  Medication Sig Dispense Refill  . acetaminophen (TYLENOL) 650 MG CR tablet Take 1,300 mg by mouth in the morning, at noon, and at bedtime.    . ACETAMINOPHEN-BUTALBITAL 50-325 MG TABS Take by mouth.    Marland Kitchen amLODipine (NORVASC) 10 MG tablet Take 1 tablet (10 mg total) by mouth daily. 90 tablet 1  . aspirin EC 81 MG tablet Take 81 mg by mouth at bedtime.     Marland Kitchen atorvastatin (LIPITOR) 40 MG tablet Take 1 tablet (40 mg total) by mouth every evening. 90 tablet 1  . butalbital-acetaminophen-caffeine (FIORICET) 50-325-40 MG tablet Take 1 tablet by mouth 2 (two) times daily as needed for headache.    . cetirizine (ZYRTEC) 10 MG tablet Take 10 mg by mouth daily with supper.      . Cholecalciferol (VITAMIN D3) 50 MCG (2000 UT) capsule Take 4,000 Units by mouth daily.     . clopidogrel (PLAVIX) 75 MG tablet Take 1 tablet (75 mg total) by mouth daily. Stop taking on 06/02/20. 90 tablet 1  . Continuous Blood Gluc Receiver (DEXCOM G6 RECEIVER) DEVI USE TO CHECK BLOOD SUGAR UP TO 4 TIMES DAILY AS DIRECTED. 1 DEVICE PER YEAR. DX: E11.9 1 each 1  . Continuous Blood Gluc Sensor (DEXCOM G6 SENSOR) MISC USE TO CHECK BLOOD SUGAR UP TO 4 TIMES DAILY AS DIRECTED. CHANGE SENSOR EVERY 30 DAYS. DX: E11.9 3 each 4  . Continuous Blood Gluc Transmit (DEXCOM G6 TRANSMITTER) MISC USE TO CHECK BLOOD SUGAR UP TO 4 TIMES DAILY AS DIRECTED. CHANGE TRANSMITTER EVERY 3 MONTHS. DX: E11.9 1 each 12  . docusate sodium (COLACE) 100 MG capsule Take 100 mg by mouth daily as needed for mild constipation or moderate constipation.     Marland Kitchen donepezil (ARICEPT) 5 MG  tablet Take 5 mg by mouth at bedtime.    . famotidine (PEPCID) 20 MG tablet TAKE 1 TABLET BY MOUTH  TWICE DAILY WITH MEALS 180 tablet 0  . furosemide (LASIX) 20 MG tablet Take 1 tablet (20 mg total) by mouth daily as needed. 90 tablet 0  . glucose blood (ONETOUCH ULTRA) test strip Check BS TID Dx E11.59 400 each 3  . insulin lispro (HUMALOG) 100 UNIT/ML injection Inject 0-20 Units into the skin 3 (three) times daily as needed for high blood sugar.    . isosorbide mononitrate (IMDUR) 30 MG 24 hr tablet TAKE 1 TABLET BY MOUTH IN  THE EVENING 90 tablet 1  . LANTUS SOLOSTAR 100 UNIT/ML Solostar Pen INJECT SUBCUTANEOUSLY 52  UNITS AT BEDTIME 60 mL 3  . metFORMIN (GLUCOPHAGE-XR) 500 MG 24 hr tablet TAKE 2 TABLETS BY MOUTH  TWICE DAILY 360 tablet 0  . mupirocin ointment (BACTROBAN) 2 % Apply 1 application topically 2 (two) times daily. 22 g 1  . nitroGLYCERIN (NITROSTAT) 0.4 MG SL tablet DISSOLVE ONE TABLET UNDER THE TONGUE EVERY 5 MINUTES AS NEEDED FOR CHEST PAIN.  DO NOT EXCEED A TOTAL OF 3 DOSES IN 15 MINUTES 25 tablet 3  . Omega-3 Fatty Acids (FISH OIL)  1200 MG CAPS Take 1,200-2,400 mg by mouth See admin instructions. Tale 1200 mg in the morning and 2400 mg in the evening    . PARoxetine (PAXIL) 20 MG tablet TAKE 1 TABLET BY MOUTH  DAILY 90 tablet 0  . tamsulosin (FLOMAX) 0.4 MG CAPS capsule TAKE 1 CAPSULE BY MOUTH  DAILY 90 capsule 0  . Turmeric Curcumin 500 MG CAPS Take 500 mg by mouth daily.    . vitamin B-12 (CYANOCOBALAMIN) 1000 MCG tablet Take 2,000 mcg by mouth daily.     No current facility-administered medications for this visit.   Allergies:  Ace inhibitors, Angiotensin receptor blockers, Ciprofloxacin, Oxycodone-acetaminophen, Robaxin [methocarbamol], Toradol [ketorolac tromethamine], Valium, and Doxycycline   ROS: Hearing loss.  Physical Exam: VS:  BP (!) 160/62   Pulse 66   Ht 5\' 6"  (1.676 m)   Wt 236 lb (107 kg)   SpO2 97%   BMI 38.09 kg/m , BMI Body mass index is 38.09 kg/m.  Wt Readings from Last 3 Encounters:  02/07/20 236 lb (107 kg)  01/10/20 237 lb (107.5 kg)  12/28/19 235 lb (106.6 kg)    General: Patient appears comfortable at rest. HEENT: Conjunctiva and lids normal, wearing a mask. Neck: Supple, no elevated JVP or carotid bruits, no thyromegaly. Lungs: Clear to auscultation, nonlabored breathing at rest. Cardiac: Regular rate and rhythm, no S3, 2/6 systolic murmur, no pericardial rub. Extremities: No pitting edema.  ECG:  An ECG dated 12/13/2019 was personally reviewed today and demonstrated:  Sinus rhythm with nonspecific T wave changes.  Recent Labwork: 11/30/2019: B Natriuretic Peptide 23.4 12/05/2019: Magnesium 1.7 12/28/2019: ALT 20; AST 27; BUN 14; Creatinine, Ser 0.97; Hemoglobin 12.9; Platelets 255; Potassium 4.5; Sodium 140     Component Value Date/Time   CHOL 118 12/28/2019 0951   CHOL 139 06/02/2012 1007   TRIG 117 12/28/2019 0951   TRIG 185 (H) 04/10/2013 0937   TRIG 107 06/02/2012 1007   HDL 36 (L) 12/28/2019 0951   HDL 34 (L) 04/10/2013 0937   HDL 40 06/02/2012 1007   CHOLHDL 3.3  12/28/2019 0951   CHOLHDL 3.8 11/16/2019 0206   VLDL 25 11/16/2019 0206   LDLCALC 61 12/28/2019 0951   LDLCALC 62 04/10/2013 [id number]  [place] 78 06/02/2012 1007    Other Studies Reviewed Today:  Echocardiogram 01/10/2020: 1. Left ventricular ejection fraction, by estimation, is 60 to 65%. The  left ventricle has normal function. The left ventricle has no regional  wall motion abnormalities. The left ventricular internal cavity size was  mildly dilated. Left ventricular  diastolic parameters are consistent with Grade I diastolic dysfunction  (impaired relaxation).  2. Right ventricular systolic function is normal. The right ventricular  size is normal.  3. The mitral valve is degenerative. Trivial mitral valve regurgitation.  No evidence of mitral stenosis.  4. The aortic valve has been repaired/replaced. Aortic valve  regurgitation is not visualized. No aortic stenosis is present. There is a  26 mm Sapien prosthetic (TAVR) valve present in the aortic position.  Procedure Date: 12/04/19. Echo findings are  consistent with normal structure and function of the aortic valve  prosthesis. Aortic valve area, by VTI measures 1.67 cm. Aortic valve mean  gradient measures 12.0 mmHg. Aortic valve Vmax measures 2.37 m/s. DI 0.34.  5. Aortic dilatation noted. There is mild dilatation of the aortic root,  measuring 43 mm. There is mild dilatation of the ascending aorta,  measuring 43 mm.  6. The inferior vena cava is normal in size with greater than 50%  respiratory variability, suggesting right atrial pressure of 3 mmHg.   Comparison(s): 12/05/19 EF 60-65%. AV 33mmHg mean PG, 25mmHg peak PG. No  significant change from prior echo.  Assessment and Plan:  1.  History of severe aortic stenosis status post TAVR in November 2021 as outlined above.  He is doing well at this point, NYHA class II dyspnea.  He continues on aspirin and Plavix (plan to stop in May).  Recent echocardiogram  reviewed.  2.  CAD status post BMS to nondominant RCA in 2006 as well as more recently DES x2 to the mid LAD in November 2021 prior to TAVR.  No active angina symptoms.  Continue DAPT for at least 6 months.  Otherwise on Norvasc, Lipitor, and Imdur.  3.  Asymptomatic, mild elevation of the ascending thoracic aorta at 43 mm.  4.  Essential hypertension, blood pressure up today, but had not yet taken her morning medications.  I did ask him to keep an eye on this with automatic cuff at home.  His last blood pressure check in December 2021 was normal.  5.  Mixed hyperlipidemia, on Lipitor.  Recent LDL 61.  Medication Adjustments/Labs and Tests Ordered: Current medicines are reviewed at length with the patient today.  Concerns regarding medicines are outlined above.   Tests Ordered: No orders of the defined types were placed in this encounter.   Medication Changes: No orders of the defined types were placed in this encounter.   Disposition:  Follow up 6 months.  Signed, Satira Sark, MD, [facility] 02/07/2020 9:01 AM    [place] [facility] [place], [place], [place] [address] Phone: [phone]; Fax: [phone]

## 2020-02-06 NOTE — Telephone Encounter (Signed)
Results. Said he just got message from last week

## 2020-02-07 ENCOUNTER — Other Ambulatory Visit: Payer: Self-pay

## 2020-02-07 ENCOUNTER — Encounter: Payer: Self-pay | Admitting: Cardiology

## 2020-02-07 ENCOUNTER — Ambulatory Visit (INDEPENDENT_AMBULATORY_CARE_PROVIDER_SITE_OTHER): Payer: Medicare Other | Admitting: Cardiology

## 2020-02-07 VITALS — BP 160/62 | HR 66 | Ht 66.0 in | Wt 236.0 lb

## 2020-02-07 DIAGNOSIS — Z952 Presence of prosthetic heart valve: Secondary | ICD-10-CM | POA: Diagnosis not present

## 2020-02-07 DIAGNOSIS — I10 Essential (primary) hypertension: Secondary | ICD-10-CM

## 2020-02-07 DIAGNOSIS — E782 Mixed hyperlipidemia: Secondary | ICD-10-CM | POA: Diagnosis not present

## 2020-02-07 DIAGNOSIS — I25119 Atherosclerotic heart disease of native coronary artery with unspecified angina pectoris: Secondary | ICD-10-CM | POA: Diagnosis not present

## 2020-02-07 NOTE — Patient Instructions (Addendum)

## 2020-02-07 NOTE — Telephone Encounter (Signed)
-----   Message from Lavonna Monarch, MD sent at 01/31/2020  9:34 PM EST ----- Please inform patient that the culture did grow a bacteria but it is uncertain whether this is the cause of the skin ulcer or just a secondary growth.  He should definitely continue the topical mupirocin twice daily for 10 days and then contact us with his status report.

## 2020-02-07 NOTE — Telephone Encounter (Signed)
Phone call to patient with his culture results and pathology results.  Patient aware.

## 2020-02-08 DIAGNOSIS — Z96641 Presence of right artificial hip joint: Secondary | ICD-10-CM | POA: Diagnosis not present

## 2020-02-08 DIAGNOSIS — M7062 Trochanteric bursitis, left hip: Secondary | ICD-10-CM | POA: Diagnosis not present

## 2020-02-10 ENCOUNTER — Other Ambulatory Visit: Payer: Self-pay | Admitting: Cardiology

## 2020-02-14 ENCOUNTER — Other Ambulatory Visit: Payer: Self-pay

## 2020-02-14 ENCOUNTER — Encounter (HOSPITAL_COMMUNITY)
Admission: RE | Admit: 2020-02-14 | Discharge: 2020-02-14 | Disposition: A | Discharge status: Home / Self Care | Payer: Medicare Other | Source: Ambulatory Visit | Attending: Cardiology | Admitting: Cardiology

## 2020-02-14 ENCOUNTER — Encounter (HOSPITAL_COMMUNITY): Payer: Self-pay

## 2020-02-14 VITALS — BP 146/62 | HR 56 | Ht 66.0 in | Wt 234.3 lb

## 2020-02-14 DIAGNOSIS — Z955 Presence of coronary angioplasty implant and graft: Secondary | ICD-10-CM | POA: Insufficient documentation

## 2020-02-14 DIAGNOSIS — Z952 Presence of prosthetic heart valve: Secondary | ICD-10-CM | POA: Diagnosis not present

## 2020-02-14 NOTE — Progress Notes (Signed)
Cardiac Individual Treatment Plan  Patient Details  Name: [name] MRN: [id number] Date of Birth: [birth year] Referring Provider:   Flowsheet Row CARDIAC REHAB PHASE II ORIENTATION from 02/14/2020 in [place]  Referring Provider Domenic Polite      Initial Encounter Date:  Flowsheet Row CARDIAC REHAB PHASE II ORIENTATION from 02/14/2020 in [place]  Date 02/14/20      Visit Diagnosis: S/P TAVR (transcatheter aortic valve replacement)  Status post coronary artery stent placement  Patient's Home Medications on Admission:  Current Outpatient Medications:  .  acetaminophen (TYLENOL) 650 MG CR tablet, Take 1,300 mg by mouth 2 (two) times daily., Disp: , Rfl:  .  ACETAMINOPHEN-BUTALBITAL 50-325 MG TABS, Take by mouth., Disp: , Rfl:  .  amLODipine (NORVASC) 10 MG tablet, Take 1 tablet (10 mg total) by mouth daily., Disp: 90 tablet, Rfl: 1 .  aspirin EC 81 MG tablet, Take 81 mg by mouth at bedtime. , Disp: , Rfl:  .  atorvastatin (LIPITOR) 40 MG tablet, Take 1 tablet (40 mg total) by mouth every evening., Disp: 90 tablet, Rfl: 1 .  butalbital-acetaminophen-caffeine (FIORICET) 50-325-40 MG tablet, Take 1 tablet by mouth 2 (two) times daily as needed for headache., Disp: , Rfl:  .  cetirizine (ZYRTEC) 10 MG tablet, Take 10 mg by mouth daily with supper. , Disp: , Rfl:  .  Cholecalciferol (VITAMIN D3) 50 MCG (2000 UT) capsule, Take 4,000 Units by mouth daily. , Disp: , Rfl:  .  clopidogrel (PLAVIX) 75 MG tablet, Take 1 tablet (75 mg total) by mouth daily. Stop taking on 06/02/20., Disp: 90 tablet, Rfl: 1 .  Continuous Blood Gluc Receiver (DEXCOM G6 RECEIVER) DEVI, USE TO CHECK BLOOD SUGAR UP TO 4 TIMES DAILY AS DIRECTED. 1 DEVICE PER YEAR. DX: E11.9, Disp: 1 each, Rfl: 1 .  Continuous Blood Gluc Sensor (DEXCOM G6 SENSOR) MISC, USE TO CHECK BLOOD SUGAR UP TO 4 TIMES DAILY AS DIRECTED. CHANGE SENSOR EVERY 30 DAYS. DX: E11.9, Disp: 3 each, Rfl: 4 .   Continuous Blood Gluc Transmit (DEXCOM G6 TRANSMITTER) MISC, USE TO CHECK BLOOD SUGAR UP TO 4 TIMES DAILY AS DIRECTED. CHANGE TRANSMITTER EVERY 3 MONTHS. DX: E11.9, Disp: 1 each, Rfl: 12 .  docusate sodium (COLACE) 100 MG capsule, Take 100 mg by mouth daily as needed for mild constipation or moderate constipation. , Disp: , Rfl:  .  donepezil (ARICEPT) 5 MG tablet, Take 5 mg by mouth at bedtime., Disp: , Rfl:  .  famotidine (PEPCID) 20 MG tablet, TAKE 1 TABLET BY MOUTH  TWICE DAILY WITH MEALS, Disp: 180 tablet, Rfl: 0 .  furosemide (LASIX) 20 MG tablet, Take 1 tablet (20 mg total) by mouth daily as needed., Disp: 90 tablet, Rfl: 0 .  glucose blood (ONETOUCH ULTRA) test strip, Check BS TID Dx E11.59, Disp: 400 each, Rfl: 3 .  insulin lispro (HUMALOG) 100 UNIT/ML injection, Inject 0-20 Units into the skin 3 (three) times daily as needed for high blood sugar., Disp: , Rfl:  .  isosorbide mononitrate (IMDUR) 30 MG 24 hr tablet, TAKE 1 TABLET BY MOUTH IN  THE EVENING, Disp: 90 tablet, Rfl: 3 .  LANTUS SOLOSTAR 100 UNIT/ML Solostar Pen, INJECT SUBCUTANEOUSLY 52  UNITS AT BEDTIME (Patient taking differently: Inject 56 Units into the skin at bedtime.), Disp: 60 mL, Rfl: 3 .  metFORMIN (GLUCOPHAGE-XR) 500 MG 24 hr tablet, TAKE 2 TABLETS BY MOUTH  TWICE DAILY, Disp: 360 tablet, Rfl: 0 .  mupirocin ointment (BACTROBAN) 2 %, Apply 1 application topically 2 (two) times daily., Disp: 22 g, Rfl: 1 .  nitroGLYCERIN (NITROSTAT) 0.4 MG SL tablet, DISSOLVE ONE TABLET UNDER THE TONGUE EVERY 5 MINUTES AS NEEDED FOR CHEST PAIN.  DO NOT EXCEED A TOTAL OF 3 DOSES IN 15 MINUTES, Disp: 25 tablet, Rfl: 3 .  Omega-3 Fatty Acids (FISH OIL) 1200 MG CAPS, Take 1,200-2,400 mg by mouth See admin instructions. Tale 1200 mg in the morning and 2400 mg in the evening, Disp: , Rfl:  .  PARoxetine (PAXIL) 20 MG tablet, TAKE 1 TABLET BY MOUTH  DAILY, Disp: 90 tablet, Rfl: 0 .  tamsulosin (FLOMAX) 0.4 MG CAPS capsule, TAKE 1 CAPSULE BY MOUTH   DAILY, Disp: 90 capsule, Rfl: 0 .  Turmeric Curcumin 500 MG CAPS, Take 500 mg by mouth daily., Disp: , Rfl:  .  vitamin B-12 (CYANOCOBALAMIN) 1000 MCG tablet, Take 2,000 mcg by mouth daily., Disp: , Rfl:   Past Medical History: Past Medical History:  Diagnosis Date  . Anxiety   . Aortic atherosclerosis ([place]) 12/19/2017  . Aortic stenosis    26 mm Edwards S3U THV November 2021  . Arthritis   . Bursitis of hip   . Cervical spondylosis   . Chronic headaches   . Coronary atherosclerosis of native coronary artery    BMS nondominant RCA 12/2004, DES x2 to mid LAD 11/2019  . Essential hypertension   . GERD (gastroesophageal reflux disease)   . History of adenomatous polyp of colon   . History of kidney stones   . History of MI (myocardial infarction) 12/2004  . History of viral meningitis 02/28/2018   December 2019  . Hyperlipidemia   . Internal hemorrhoids   . Low back pain   . Nocturia more than twice per night   . OSA (obstructive sleep apnea)   . Spinal stenosis   . Type 2 diabetes mellitus treated with insulin ([place])   . Wears dentures    Upper    Tobacco Use: Social History   Tobacco Use  Smoking Status Former Smoker  . Packs/day: 1.00  . Years: 20.00  . Pack years: 20.00  . Types: Cigarettes  . Start date: 01/18/1963  . Quit date: 08/11/1988  . Years since quitting: 31.5  Smokeless Tobacco Former Systems developer  . Types: Chew  . Quit date: 01/11/1989  Tobacco Comment   chewed 1 pack tobacco/day for 15 years    Labs: Recent Review Flowsheet Data    Labs for ITP Cardiac and Pulmonary Rehab Latest Ref Rng & Units 11/16/2019 11/30/2019 12/04/2019 12/04/2019 12/28/2019   Cholestrol 100 - 199 mg/dL 123 - - - 118   LDLCALC 0 - 99 mg/dL 66 - - - 61   HDL >39 mg/dL 32(L) - - - 36(L)   Trlycerides 0 - 149 mg/dL 125 - - - 117   Hemoglobin A1c <7.0 % - 8.2(H) - - 7.6(H)   PHART 7.350 - 7.450 - 7.422 - - -   PCO2ART 32.0 - 48.0 mmHg - 40.2 - - -   HCO3 20.0 - 28.0 mmol/L - 25.7 - - -    TCO2 22 - 32 mmol/L - - 24 28 -   O2SAT % - 94.8 - - -      Capillary Blood Glucose: Lab Results  Component Value Date   GLUCAP 224 (H) 12/05/2019   GLUCAP 223 (H) 12/05/2019   GLUCAP 223 (H) 12/04/2019   GLUCAP 264 (H) 12/04/2019  GLUCAP 220 (H) 12/04/2019    POCT Glucose    Row Name 02/14/20 1440             POCT Blood Glucose   Pre-Exercise 236 mg/dL              Exercise Target Goals: Exercise Program Goal: Individual exercise prescription set using results from initial 6 min walk test and THRR while considering  patient's activity barriers and safety.   Exercise Prescription Goal: Starting with aerobic activity 30 plus minutes a day, 3 days per week for initial exercise prescription. Provide home exercise prescription and guidelines that participant acknowledges understanding prior to discharge.  Activity Barriers & Risk Stratification:  Activity Barriers & Cardiac Risk Stratification - 02/14/20 1253      Activity Barriers & Cardiac Risk Stratification   Activity Barriers Back Problems;Left Hip Replacement;Right Hip Replacement;Left Knee Replacement;Right Knee Replacement;Neck/Spine Problems;Joint Problems;Deconditioning;Shortness of Breath;Balance Concerns    Cardiac Risk Stratification High           6 Minute Walk:  6 Minute Walk    Row Name 02/14/20 1407         6 Minute Walk   Phase Initial     Distance 600 feet     Walk Time 6 minutes     # of Rest Breaks 1  took a 2 minute break     MPH 1.1     METS 0.7     RPE 12     VO2 Peak 2.48     Symptoms Yes (comment)     Comments back pain 5/10, and shortness of breath     Resting HR 62 bpm     Resting BP 146/62     Resting Oxygen Saturation  96 %     Exercise Oxygen Saturation  during 6 min walk 96 %     Max Ex. HR 82 bpm     Max Ex. BP 188/62     2 Minute Post BP 156/62            Oxygen Initial Assessment:   Oxygen Re-Evaluation:   Oxygen Discharge (Final Oxygen  Re-Evaluation):   Initial Exercise Prescription:  Initial Exercise Prescription - 02/14/20 1400      Date of Initial Exercise RX and Referring Provider   Date 02/14/20    Referring Provider Domenic Polite    Expected Discharge Date 05/07/20      NuStep   Level 1    SPM 60    Minutes 22      Arm Ergometer   Level 1    RPM 40    Minutes 17      Prescription Details   Frequency (times per week) 3    Duration Progress to 30 minutes of continuous aerobic without signs/symptoms of physical distress      Intensity   THRR 40-80% of Max Heartrate 56-112    Ratings of Perceived Exertion 11-13      Progression   Progression Continue progressive overload as per policy without signs/symptoms or physical distress.      Resistance Training   Training Prescription Yes    Weight 2    Reps 10-15           Perform Capillary Blood Glucose checks as needed.  Exercise Prescription Changes:   Exercise Comments:   Exercise Goals and Review:  Exercise Goals    Row Name 02/14/20 1412             Exercise  Goals   Increase Physical Activity Yes       Expected Outcomes Long Term: Add in home exercise to make exercise part of routine and to increase amount of physical activity.;Short Term: Attend rehab on a regular basis to increase amount of physical activity.;Long Term: Exercising regularly at least 3-5 days a week.       Increase Strength and Stamina Yes       Expected Outcomes Short Term: Increase workloads from initial exercise prescription for resistance, speed, and METs.;Short Term: Perform resistance training exercises routinely during rehab and add in resistance training at home;Long Term: Improve cardiorespiratory fitness, muscular endurance and strength as measured by increased METs and functional capacity (6MWT)       Able to understand and use rate of perceived exertion (RPE) scale Yes       Intervention Provide education and explanation on how to use RPE scale       Expected  Outcomes Short Term: Able to use RPE daily in rehab to express subjective intensity level;Long Term:  Able to use RPE to guide intensity level when exercising independently       Knowledge and understanding of Target Heart Rate Range (THRR) Yes       Intervention Provide education and explanation of THRR including how the numbers were predicted and where they are located for reference       Expected Outcomes Short Term: Able to state/look up THRR;Long Term: Able to use THRR to govern intensity when exercising independently;Short Term: Able to use daily as guideline for intensity in rehab       Able to check pulse independently Yes       Intervention Review the importance of being able to check your own pulse for safety during independent exercise;Provide education and demonstration on how to check pulse in carotid and radial arteries.       Expected Outcomes Short Term: Able to explain why pulse checking is important during independent exercise;Long Term: Able to check pulse independently and accurately       Understanding of Exercise Prescription Yes       Intervention Provide education, explanation, and written materials on patient's individual exercise prescription       Expected Outcomes Short Term: Able to explain program exercise prescription;Long Term: Able to explain home exercise prescription to exercise independently              Exercise Goals Re-Evaluation :    Discharge Exercise Prescription (Final Exercise Prescription Changes):   Nutrition:  Target Goals: Understanding of nutrition guidelines, daily intake of sodium 1500mg , cholesterol 200mg , calories 30% from fat and 7% or less from saturated fats, daily to have 5 or more servings of fruits and vegetables.  Biometrics:  Pre Biometrics - 02/14/20 1413      Pre Biometrics   Height 5\' 6"  (1.676 m)    Weight 234 lb 5.6 oz (106.3 kg)    Waist Circumference 50 inches    Hip Circumference 46 inches    Waist to Hip Ratio  1.09 %    BMI (Calculated) 37.84    Triceps Skinfold 10 mm    % Body Fat 35.2 %    Grip Strength 24.3 kg    Flexibility 0 in   back pain so did not complete   Single Leg Stand 1 seconds            Nutrition Therapy Plan and Nutrition Goals:   Nutrition Assessments:  Nutrition Assessments - 02/14/20 1302  MEDFICTS Scores   Pre Score 69          MEDIFICTS Score Key:  ?70 Need to make dietary changes   40-70 Heart Healthy Diet  ? 40 Therapeutic Level Cholesterol Diet   Picture Your Plate Scores:  [id number] Unhealthy dietary pattern with much room for improvement.  41-50 Dietary pattern unlikely to meet recommendations for good health and room for improvement.  51-60 More healthful dietary pattern, with some room for improvement.   >60 Healthy dietary pattern, although there may be some specific behaviors that could be improved.    Nutrition Goals Re-Evaluation:   Nutrition Goals Discharge (Final Nutrition Goals Re-Evaluation):   Psychosocial: Target Goals: Acknowledge presence or absence of significant depression and/or stress, maximize coping skills, provide positive support system. Participant is able to verbalize types and ability to use techniques and skills needed for reducing stress and depression.  Initial Review & Psychosocial Screening:  Initial Psych Review & Screening - 02/14/20 1254      Initial Review   Current issues with None Identified      Family Dynamics   Good Support System? Yes    Comments His wife is his support system. He talks with his children, grandchildren, and great grandchildren several times per week.      Barriers   Psychosocial barriers to participate in program There are no identifiable barriers or psychosocial needs.      Screening Interventions   Interventions Encouraged to exercise    Expected Outcomes Short Term goal: Utilizing psychosocial counselor, staff and physician to assist with identification of specific  Stressors or current issues interfering with healing process. Setting desired goal for each stressor or current issue identified.;Long Term Goal: Stressors or current issues are controlled or eliminated.;Short Term goal: Identification and review with participant of any Quality of Life or Depression concerns found by scoring the questionnaire.;Long Term goal: The participant improves quality of Life and PHQ9 Scores as seen by post scores and/or verbalization of changes           Quality of Life Scores:  Quality of Life - 02/14/20 1414      Quality of Life   Select Quality of Life      Quality of Life Scores   Health/Function Pre 24 %    Socioeconomic Pre 25.64 %    Psych/Spiritual Pre 24 %    Family Pre 21.6 %    GLOBAL Pre 23.98 %          Scores of 19 and below usually indicate a poorer quality of life in these areas.  A difference of  2-3 points is a clinically meaningful difference.  A difference of 2-3 points in the total score of the Quality of Life Index has been associated with significant improvement in overall quality of life, self-image, physical symptoms, and general health in studies assessing change in quality of life.  PHQ-9: Recent Review Flowsheet Data    Depression screen [facility] 2/9 02/14/2020 12/28/2019 10/01/2019 06/22/2019 06/07/2019   Decreased Interest 0 0 0 0 0   Down, Depressed, Hopeless 0 0 0 0 1   PHQ - 2 Score 0 0 0 0 1   Altered sleeping 0 - - - -   Tired, decreased energy 1 - - - -   Change in appetite 0 - - - -   Feeling bad or failure about yourself  0 - - - -   Trouble concentrating 0 - - - -   Moving  slowly or fidgety/restless 0 - - - -   Suicidal thoughts 0 - - - -   PHQ-9 Score 1 - - - -   Difficult doing work/chores Not difficult at all - - - -     Interpretation of Total Score  Total Score Depression Severity:  1-4 = Minimal depression, 5-9 = Mild depression, 10-14 = Moderate depression, 15-19 = Moderately severe depression, 20-27 = Severe  depression   Psychosocial Evaluation and Intervention:  Psychosocial Evaluation - 02/14/20 1306      Psychosocial Evaluation & Interventions   Interventions Encouraged to exercise with the program and follow exercise prescription    Comments Patient has no identifiable psychosocial issues at orientation.    Expected Outcomes Patient will continue to not have any identifiable psychosocial issues in the cardiac rehab program.    Continue Psychosocial Services  No Follow up required           Psychosocial Re-Evaluation:   Psychosocial Discharge (Final Psychosocial Re-Evaluation):   Vocational Rehabilitation: Provide vocational rehab assistance to qualifying candidates.   Vocational Rehab Evaluation & Intervention:  Vocational Rehab - 02/14/20 1254      Initial Vocational Rehab Evaluation & Intervention   Assessment shows need for Vocational Rehabilitation No      Vocational Rehab Re-Evaulation   Comments He is retired and is not interested in returning to work.           Education: Education Goals: Education classes will be provided on a weekly basis, covering required topics. Participant will state understanding/return demonstration of topics presented.  Learning Barriers/Preferences:  Learning Barriers/Preferences - 02/14/20 1255      Learning Barriers/Preferences   Learning Barriers Hearing   cannot hear out of right ear   Learning Preferences Audio;Video;Computer/Internet;Group Instruction;Individual Instruction;Pictoral;Skilled Demonstration;Verbal Instruction;Written Material           Education Topics: Hypertension, Hypertension Reduction -Define heart disease and high blood pressure. Discus how high blood pressure affects the body and ways to reduce high blood pressure.   Exercise and Your Heart -Discuss why it is important to exercise, the FITT principles of exercise, normal and abnormal responses to exercise, and how to exercise  safely.   Angina -Discuss definition of angina, causes of angina, treatment of angina, and how to decrease risk of having angina.   Cardiac Medications -Review what the following cardiac medications are used for, how they affect the body, and side effects that may occur when taking the medications.  Medications include Aspirin, Beta blockers, calcium channel blockers, ACE Inhibitors, angiotensin receptor blockers, diuretics, digoxin, and antihyperlipidemics.   Congestive Heart Failure -Discuss the definition of CHF, how to live with CHF, the signs and symptoms of CHF, and how keep track of weight and sodium intake.   Heart Disease and Intimacy -Discus the effect sexual activity has on the heart, how changes occur during intimacy as we age, and safety during sexual activity.   Smoking Cessation / COPD -Discuss different methods to quit smoking, the health benefits of quitting smoking, and the definition of COPD.   Nutrition I: Fats -Discuss the types of cholesterol, what cholesterol does to the heart, and how cholesterol levels can be controlled.   Nutrition II: Labels -Discuss the different components of food labels and how to read food label   Heart Parts/Heart Disease and PAD -Discuss the anatomy of the heart, the pathway of blood circulation through the heart, and these are affected by heart disease.   Stress I: Signs and Symptoms -  Discuss the causes of stress, how stress may lead to anxiety and depression, and ways to limit stress.   Stress II: Relaxation -Discuss different types of relaxation techniques to limit stress.   Warning Signs of Stroke / TIA -Discuss definition of a stroke, what the signs and symptoms are of a stroke, and how to identify when someone is having stroke.   Knowledge Questionnaire Score:  Knowledge Questionnaire Score - 02/14/20 1300      Knowledge Questionnaire Score   Pre Score 20/24           Core Components/Risk Factors/Patient  Goals at Admission:  Personal Goals and Risk Factors at Admission - 02/14/20 1256      Core Components/Risk Factors/Patient Goals on Admission    Weight Management Yes;Weight Loss   He would like to lose 10 lbs while in the program   Intervention Weight Management: Develop a combined nutrition and exercise program designed to reach desired caloric intake, while maintaining appropriate intake of nutrient and fiber, sodium and fats, and appropriate energy expenditure required for the weight goal.;Weight Management: Provide education and appropriate resources to help participant work on and attain dietary goals.;Weight Management/Obesity: Establish reasonable short term and long term weight goals.;Obesity: Provide education and appropriate resources to help participant work on and attain dietary goals.    Expected Outcomes Short Term: Continue to assess and modify interventions until short term weight is achieved;Long Term: Adherence to nutrition and physical activity/exercise program aimed toward attainment of established weight goal;Weight Maintenance: Understanding of the daily nutrition guidelines, which includes 25-35% calories from fat, 7% or less cal from saturated fats, less than 200mg  cholesterol, less than 1.5gm of sodium, & 5 or more servings of fruits and vegetables daily;Weight Loss: Understanding of general recommendations for a balanced deficit meal plan, which promotes 1-2 lb weight loss per week and includes a negative energy balance of [phone] kcal/d;Understanding recommendations for meals to include 15-35% energy as protein, 25-35% energy from fat, 35-60% energy from carbohydrates, less than 200mg  of dietary cholesterol, 20-35 gm of total fiber daily;Understanding of distribution of calorie intake throughout the day with the consumption of 4-5 meals/snacks;Weight Gain: Understanding of general recommendations for a high calorie, high protein meal plan that promotes weight gain by distributing  calorie intake throughout the day with the consumption for 4-5 meals, snacks, and/or supplements    Improve shortness of breath with ADL's Yes    Intervention Provide education, individualized exercise plan and daily activity instruction to help decrease symptoms of SOB with activities of daily living.    Expected Outcomes Short Term: Improve cardiorespiratory fitness to achieve a reduction of symptoms when performing ADLs;Long Term: Be able to perform more ADLs without symptoms or delay the onset of symptoms    Diabetes Yes    Intervention Provide education about signs/symptoms and action to take for hypo/hyperglycemia.;Provide education about proper nutrition, including hydration, and aerobic/resistive exercise prescription along with prescribed medications to achieve blood glucose in normal ranges: Fasting glucose 65-99 mg/dL    Expected Outcomes Short Term: Participant verbalizes understanding of the signs/symptoms and immediate care of hyper/hypoglycemia, proper foot care and importance of medication, aerobic/resistive exercise and nutrition plan for blood glucose control.;Long Term: Attainment of HbA1C < 7%.    Hypertension Yes    Intervention Provide education on lifestyle modifcations including regular physical activity/exercise, weight management, moderate sodium restriction and increased consumption of fresh fruit, vegetables, and low fat dairy, alcohol moderation, and smoking cessation.;Monitor prescription use compliance.    Expected  Outcomes Short Term: Continued assessment and intervention until BP is < 140/35mm HG in hypertensive participants. < 130/49mm HG in hypertensive participants with diabetes, heart failure or chronic kidney disease.;Long Term: Maintenance of blood pressure at goal levels.    Lipids Yes    Intervention Provide education and support for participant on nutrition & aerobic/resistive exercise along with prescribed medications to achieve LDL 70mg , HDL >40mg .    Expected  Outcomes Short Term: Participant states understanding of desired cholesterol values and is compliant with medications prescribed. Participant is following exercise prescription and nutrition guidelines.;Long Term: Cholesterol controlled with medications as prescribed, with individualized exercise RX and with personalized nutrition plan. Value goals: LDL < 70mg , HDL > 40 mg.           Core Components/Risk Factors/Patient Goals Review:    Core Components/Risk Factors/Patient Goals at Discharge (Final Review):    ITP Comments:   Comments: Patient arrived for 1st visit/orientation/education at 1230. Patient was referred to CR by Dr. Domenic Polite due to S/P TAVR (Z95.2) and status post coronary artery stent placement (Z95.5). During orientation advised patient on arrival and appointment times what to wear, what to do before, during and after exercise. Reviewed attendance and class policy.  Pt is scheduled to return Cardiac Rehab on 02/18/2020 at 1445. Pt was advised to come to class 15 minutes before class starts.  Discussed RPE/Dpysnea scales. Patient participated in warm up stretches. Patient was able to complete 6 minute walk test.  Telemetry: NSR. Patient was measured for the equipment. Discussed equipment safety with patient. Took patient pre-anthropometric measurements. Patient finished visit at 1405.

## 2020-02-14 NOTE — Progress Notes (Signed)
Cardiac/Pulmonary Rehab Medication Review by a Pharmacist  Does the patient  feel that his/her medications are working for him/her?  yes  Has the patient been experiencing any side effects to the medications prescribed?  yes   Does the patient measure his/her own blood pressure or blood glucose at home?  yes   Does the patient have any problems obtaining medications due to transportation or finances?   no  Understanding of regimen: excellent Understanding of indications: good Potential of compliance: excellent  Questions asked to Determine Patient Understanding of Medication Regimen:  1. What is the name of the medication?  2. What is the medication used for?  3. When should it be taken?  4. How much should be taken?  5. How will you take it?  6. What side effects should you report?  Understanding Defined as: Excellent: All questions above are correct Good: Questions 1-4 are correct Fair: Questions 1-2 are correct  Poor: 1 or none of the above questions are correct   Pharmacist comments: No further questions.    Ramond Craver 02/14/2020 1:18 PM

## 2020-02-18 ENCOUNTER — Other Ambulatory Visit: Payer: Self-pay

## 2020-02-18 ENCOUNTER — Encounter (HOSPITAL_COMMUNITY)
Admission: RE | Admit: 2020-02-18 | Discharge: 2020-02-18 | Disposition: A | Discharge status: Home / Self Care | Payer: Medicare Other | Source: Ambulatory Visit | Attending: Cardiology | Admitting: Cardiology

## 2020-02-18 VITALS — Wt 238.8 lb

## 2020-02-18 DIAGNOSIS — Z955 Presence of coronary angioplasty implant and graft: Secondary | ICD-10-CM | POA: Diagnosis not present

## 2020-02-18 DIAGNOSIS — Z952 Presence of prosthetic heart valve: Secondary | ICD-10-CM

## 2020-02-18 NOTE — Progress Notes (Signed)
Daily Session Note  Patient Details  Name: [name] MRN: [id number] Date of Birth: [birth year] Referring Provider:   Flowsheet Row CARDIAC REHAB PHASE II ORIENTATION from 02/14/2020 in [facility]  Referring Provider Domenic Polite      Encounter Date: 02/18/2020  Check In:  Session Check In - 02/18/20 1445      Check-In   Supervising physician immediately available to respond to emergencies [facility] MD immediately available    Physician(s) Dr. Harl Bowie    Location [facility]    Staff Present Cathren Harsh, MS, Exercise Physiologist;Debra Wynetta Emery, RN, BSN    Virtual Visit No    Medication changes reported     No    Fall or balance concerns reported    No    Tobacco Cessation No Change    Warm-up and Cool-down Performed as group-led instruction    Resistance Training Performed Yes    VAD Patient? No    PAD/SET Patient? No      Pain Assessment   Currently in Pain? No/denies    Multiple Pain Sites No           Capillary Blood Glucose: No results found for this or any previous visit (from the past 24 hour(s)).    Social History   Tobacco Use  Smoking Status Former Smoker  . Packs/day: 1.00  . Years: 20.00  . Pack years: 20.00  . Types: Cigarettes  . Start date: 01/18/1963  . Quit date: 08/11/1988  . Years since quitting: 31.5  Smokeless Tobacco Former Systems developer  . Types: Chew  . Quit date: 01/11/1989  Tobacco Comment   chewed 1 pack tobacco/day for 15 years    Goals Met:  Independence with exercise equipment Exercise tolerated well No report of cardiac concerns or symptoms Strength training completed today  Goals Unmet:  Not Applicable  Comments: checkout 1545   Dr. Kathie Dike is Medical Director for [facility].

## 2020-02-20 ENCOUNTER — Other Ambulatory Visit: Payer: Self-pay

## 2020-02-20 ENCOUNTER — Encounter (HOSPITAL_COMMUNITY)
Admission: RE | Admit: 2020-02-20 | Discharge: 2020-02-20 | Disposition: A | Discharge status: Home / Self Care | Payer: Medicare Other | Source: Ambulatory Visit | Attending: Cardiology | Admitting: Cardiology

## 2020-02-20 DIAGNOSIS — Z955 Presence of coronary angioplasty implant and graft: Secondary | ICD-10-CM | POA: Diagnosis not present

## 2020-02-20 DIAGNOSIS — Z952 Presence of prosthetic heart valve: Secondary | ICD-10-CM | POA: Diagnosis not present

## 2020-02-20 NOTE — Progress Notes (Signed)
Daily Session Note  Patient Details  Name: [name] MRN: [id number] Date of Birth: [birth year] Referring Provider:   Flowsheet Row CARDIAC REHAB PHASE II ORIENTATION from 02/14/2020 in [facility]  Referring Provider Domenic Polite      Encounter Date: 02/20/2020  Check In:  Session Check In - 02/20/20 1445      Check-In   Supervising physician immediately available to respond to emergencies [facility] MD immediately available    Physician(s) Dr. Harl Bowie    Location [facility]    Staff Present Cathren Harsh, MS, Exercise Physiologist;Debra Wynetta Emery, RN, BSN    Virtual Visit No    Medication changes reported     No    Fall or balance concerns reported    No    Tobacco Cessation No Change    Warm-up and Cool-down Performed as group-led instruction    Resistance Training Performed Yes    VAD Patient? No    PAD/SET Patient? No      Pain Assessment   Currently in Pain? No/denies    Multiple Pain Sites No           Capillary Blood Glucose: No results found for this or any previous visit (from the past 24 hour(s)).    Social History   Tobacco Use  Smoking Status Former Smoker  . Packs/day: 1.00  . Years: 20.00  . Pack years: 20.00  . Types: Cigarettes  . Start date: 01/18/1963  . Quit date: 08/11/1988  . Years since quitting: 31.5  Smokeless Tobacco Former Systems developer  . Types: Chew  . Quit date: 01/11/1989  Tobacco Comment   chewed 1 pack tobacco/day for 15 years    Goals Met:  Independence with exercise equipment Exercise tolerated well No report of cardiac concerns or symptoms Strength training completed today  Goals Unmet:  Not Applicable  Comments: check out 1545   Dr. Kathie Dike is Medical Director for [facility].

## 2020-02-20 NOTE — Progress Notes (Signed)
Cardiac Individual Treatment Plan  Patient Details  Name: [name] MRN: [id number] Date of Birth: [birth year] Referring Provider:   Flowsheet Row CARDIAC REHAB PHASE II ORIENTATION from 02/14/2020 in [place]  Referring Provider Domenic Polite      Initial Encounter Date:  Flowsheet Row CARDIAC REHAB PHASE II ORIENTATION from 02/14/2020 in [place]  Date 02/14/20      Visit Diagnosis: S/P TAVR (transcatheter aortic valve replacement)  Status post coronary artery stent placement  Patient's Home Medications on Admission:  Current Outpatient Medications:  .  acetaminophen (TYLENOL) 650 MG CR tablet, Take 1,300 mg by mouth 2 (two) times daily., Disp: , Rfl:  .  ACETAMINOPHEN-BUTALBITAL 50-325 MG TABS, Take by mouth., Disp: , Rfl:  .  amLODipine (NORVASC) 10 MG tablet, Take 1 tablet (10 mg total) by mouth daily., Disp: 90 tablet, Rfl: 1 .  aspirin EC 81 MG tablet, Take 81 mg by mouth at bedtime. , Disp: , Rfl:  .  atorvastatin (LIPITOR) 40 MG tablet, Take 1 tablet (40 mg total) by mouth every evening., Disp: 90 tablet, Rfl: 1 .  butalbital-acetaminophen-caffeine (FIORICET) 50-325-40 MG tablet, Take 1 tablet by mouth 2 (two) times daily as needed for headache., Disp: , Rfl:  .  cetirizine (ZYRTEC) 10 MG tablet, Take 10 mg by mouth daily with supper. , Disp: , Rfl:  .  Cholecalciferol (VITAMIN D3) 50 MCG (2000 UT) capsule, Take 4,000 Units by mouth daily. , Disp: , Rfl:  .  clopidogrel (PLAVIX) 75 MG tablet, Take 1 tablet (75 mg total) by mouth daily. Stop taking on 06/02/20., Disp: 90 tablet, Rfl: 1 .  Continuous Blood Gluc Receiver (DEXCOM G6 RECEIVER) DEVI, USE TO CHECK BLOOD SUGAR UP TO 4 TIMES DAILY AS DIRECTED. 1 DEVICE PER YEAR. DX: E11.9, Disp: 1 each, Rfl: 1 .  Continuous Blood Gluc Sensor (DEXCOM G6 SENSOR) MISC, USE TO CHECK BLOOD SUGAR UP TO 4 TIMES DAILY AS DIRECTED. CHANGE SENSOR EVERY 30 DAYS. DX: E11.9, Disp: 3 each, Rfl: 4 .   Continuous Blood Gluc Transmit (DEXCOM G6 TRANSMITTER) MISC, USE TO CHECK BLOOD SUGAR UP TO 4 TIMES DAILY AS DIRECTED. CHANGE TRANSMITTER EVERY 3 MONTHS. DX: E11.9, Disp: 1 each, Rfl: 12 .  docusate sodium (COLACE) 100 MG capsule, Take 100 mg by mouth daily as needed for mild constipation or moderate constipation. , Disp: , Rfl:  .  donepezil (ARICEPT) 5 MG tablet, Take 5 mg by mouth at bedtime., Disp: , Rfl:  .  famotidine (PEPCID) 20 MG tablet, TAKE 1 TABLET BY MOUTH  TWICE DAILY WITH MEALS, Disp: 180 tablet, Rfl: 0 .  furosemide (LASIX) 20 MG tablet, Take 1 tablet (20 mg total) by mouth daily as needed., Disp: 90 tablet, Rfl: 0 .  glucose blood (ONETOUCH ULTRA) test strip, Check BS TID Dx E11.59, Disp: 400 each, Rfl: 3 .  insulin lispro (HUMALOG) 100 UNIT/ML injection, Inject 0-20 Units into the skin 3 (three) times daily as needed for high blood sugar., Disp: , Rfl:  .  isosorbide mononitrate (IMDUR) 30 MG 24 hr tablet, TAKE 1 TABLET BY MOUTH IN  THE EVENING, Disp: 90 tablet, Rfl: 3 .  LANTUS SOLOSTAR 100 UNIT/ML Solostar Pen, INJECT SUBCUTANEOUSLY 52  UNITS AT BEDTIME (Patient taking differently: Inject 56 Units into the skin at bedtime.), Disp: 60 mL, Rfl: 3 .  metFORMIN (GLUCOPHAGE-XR) 500 MG 24 hr tablet, TAKE 2 TABLETS BY MOUTH  TWICE DAILY, Disp: 360 tablet, Rfl: 0 .  mupirocin ointment (BACTROBAN) 2 %, Apply 1 application topically 2 (two) times daily., Disp: 22 g, Rfl: 1 .  nitroGLYCERIN (NITROSTAT) 0.4 MG SL tablet, DISSOLVE ONE TABLET UNDER THE TONGUE EVERY 5 MINUTES AS NEEDED FOR CHEST PAIN.  DO NOT EXCEED A TOTAL OF 3 DOSES IN 15 MINUTES, Disp: 25 tablet, Rfl: 3 .  Omega-3 Fatty Acids (FISH OIL) 1200 MG CAPS, Take 1,200-2,400 mg by mouth See admin instructions. Tale 1200 mg in the morning and 2400 mg in the evening, Disp: , Rfl:  .  PARoxetine (PAXIL) 20 MG tablet, TAKE 1 TABLET BY MOUTH  DAILY, Disp: 90 tablet, Rfl: 0 .  tamsulosin (FLOMAX) 0.4 MG CAPS capsule, TAKE 1 CAPSULE BY MOUTH   DAILY, Disp: 90 capsule, Rfl: 0 .  Turmeric Curcumin 500 MG CAPS, Take 500 mg by mouth daily., Disp: , Rfl:  .  vitamin B-12 (CYANOCOBALAMIN) 1000 MCG tablet, Take 2,000 mcg by mouth daily., Disp: , Rfl:   Past Medical History: Past Medical History:  Diagnosis Date  . Anxiety   . Aortic atherosclerosis ([place]) 12/19/2017  . Aortic stenosis    26 mm Edwards S3U THV November 2021  . Arthritis   . Bursitis of hip   . Cervical spondylosis   . Chronic headaches   . Coronary atherosclerosis of native coronary artery    BMS nondominant RCA 12/2004, DES x2 to mid LAD 11/2019  . Essential hypertension   . GERD (gastroesophageal reflux disease)   . History of adenomatous polyp of colon   . History of kidney stones   . History of MI (myocardial infarction) 12/2004  . History of viral meningitis 02/28/2018   December 2019  . Hyperlipidemia   . Internal hemorrhoids   . Low back pain   . Nocturia more than twice per night   . OSA (obstructive sleep apnea)   . Spinal stenosis   . Type 2 diabetes mellitus treated with insulin ([place])   . Wears dentures    Upper    Tobacco Use: Social History   Tobacco Use  Smoking Status Former Smoker  . Packs/day: 1.00  . Years: 20.00  . Pack years: 20.00  . Types: Cigarettes  . Start date: 01/18/1963  . Quit date: 08/11/1988  . Years since quitting: 31.5  Smokeless Tobacco Former Systems developer  . Types: Chew  . Quit date: 01/11/1989  Tobacco Comment   chewed 1 pack tobacco/day for 15 years    Labs: Recent Review Flowsheet Data    Labs for ITP Cardiac and Pulmonary Rehab Latest Ref Rng & Units 11/16/2019 11/30/2019 12/04/2019 12/04/2019 12/28/2019   Cholestrol 100 - 199 mg/dL 123 - - - 118   LDLCALC 0 - 99 mg/dL 66 - - - 61   HDL >39 mg/dL 32(L) - - - 36(L)   Trlycerides 0 - 149 mg/dL 125 - - - 117   Hemoglobin A1c <7.0 % - 8.2(H) - - 7.6(H)   PHART 7.350 - 7.450 - 7.422 - - -   PCO2ART 32.0 - 48.0 mmHg - 40.2 - - -   HCO3 20.0 - 28.0 mmol/L - 25.7 - - -    TCO2 22 - 32 mmol/L - - 24 28 -   O2SAT % - 94.8 - - -      Capillary Blood Glucose: Lab Results  Component Value Date   GLUCAP 224 (H) 12/05/2019   GLUCAP 223 (H) 12/05/2019   GLUCAP 223 (H) 12/04/2019   GLUCAP 264 (H) 12/04/2019  GLUCAP 220 (H) 12/04/2019    POCT Glucose    Row Name 02/14/20 1440             POCT Blood Glucose   Pre-Exercise 236 mg/dL              Exercise Target Goals: Exercise Program Goal: Individual exercise prescription set using results from initial 6 min walk test and THRR while considering  patient's activity barriers and safety.   Exercise Prescription Goal: Starting with aerobic activity 30 plus minutes a day, 3 days per week for initial exercise prescription. Provide home exercise prescription and guidelines that participant acknowledges understanding prior to discharge.  Activity Barriers & Risk Stratification:  Activity Barriers & Cardiac Risk Stratification - 02/14/20 1253      Activity Barriers & Cardiac Risk Stratification   Activity Barriers Back Problems;Left Hip Replacement;Right Hip Replacement;Left Knee Replacement;Right Knee Replacement;Neck/Spine Problems;Joint Problems;Deconditioning;Shortness of Breath;Balance Concerns    Cardiac Risk Stratification High           6 Minute Walk:  6 Minute Walk    Row Name 02/14/20 1407         6 Minute Walk   Phase Initial     Distance 600 feet     Walk Time 6 minutes     # of Rest Breaks 1  took a 2 minute break     MPH 1.1     METS 0.7     RPE 12     VO2 Peak 2.48     Symptoms Yes (comment)     Comments back pain 5/10, and shortness of breath     Resting HR 62 bpm     Resting BP 146/62     Resting Oxygen Saturation  96 %     Exercise Oxygen Saturation  during 6 min walk 96 %     Max Ex. HR 82 bpm     Max Ex. BP 188/62     2 Minute Post BP 156/62            Oxygen Initial Assessment:   Oxygen Re-Evaluation:   Oxygen Discharge (Final Oxygen  Re-Evaluation):   Initial Exercise Prescription:  Initial Exercise Prescription - 02/14/20 1400      Date of Initial Exercise RX and Referring Provider   Date 02/14/20    Referring Provider Domenic Polite    Expected Discharge Date 05/07/20      NuStep   Level 1    SPM 60    Minutes 22      Arm Ergometer   Level 1    RPM 40    Minutes 17      Prescription Details   Frequency (times per week) 3    Duration Progress to 30 minutes of continuous aerobic without signs/symptoms of physical distress      Intensity   THRR 40-80% of Max Heartrate 56-112    Ratings of Perceived Exertion 11-13      Progression   Progression Continue progressive overload as per policy without signs/symptoms or physical distress.      Resistance Training   Training Prescription Yes    Weight 2    Reps 10-15           Perform Capillary Blood Glucose checks as needed.  Exercise Prescription Changes:   Exercise Prescription Changes    Row Name 02/19/20 1000             Response to Exercise   Blood Pressure (Admit) 160/72  Blood Pressure (Exercise) 178/76       Blood Pressure (Exit) 170/80       Heart Rate (Admit) 76 bpm       Heart Rate (Exercise) 99 bpm       Heart Rate (Exit) 76 bpm       Rating of Perceived Exertion (Exercise) 13       Duration Continue with 30 min of aerobic exercise without signs/symptoms of physical distress.       Intensity THRR unchanged               Progression   Progression Continue to progress workloads to maintain intensity without signs/symptoms of physical distress.               Resistance Training   Training Prescription Yes       Weight 2       Reps 10-15       Time 10 Minutes               NuStep   Level 1       SPM 68       Minutes 22       METs 1.8               Arm Ergometer   Level 1       RPM 48       Minutes 17       METs 1.5              Exercise Comments:   Exercise Comments    Row Name 02/18/20 1557            Exercise Comments Patient completed first exercise session today. He tolerated exercise well with no complaints. He enjoyed rehab today and is enthusiastic to come back.              Exercise Goals and Review:   Exercise Goals    Row Name 02/14/20 1412 02/19/20 1027           Exercise Goals   Increase Physical Activity Yes Yes      Intervention -- Provide advice, education, support and counseling about physical activity/exercise needs.;Develop an individualized exercise prescription for aerobic and resistive training based on initial evaluation findings, risk stratification, comorbidities and participant's personal goals.      Expected Outcomes Long Term: Add in home exercise to make exercise part of routine and to increase amount of physical activity.;Short Term: Attend rehab on a regular basis to increase amount of physical activity.;Long Term: Exercising regularly at least 3-5 days a week. Long Term: Add in home exercise to make exercise part of routine and to increase amount of physical activity.;Short Term: Attend rehab on a regular basis to increase amount of physical activity.;Long Term: Exercising regularly at least 3-5 days a week.      Increase Strength and Stamina Yes Yes      Intervention -- Provide advice, education, support and counseling about physical activity/exercise needs.;Develop an individualized exercise prescription for aerobic and resistive training based on initial evaluation findings, risk stratification, comorbidities and participant's personal goals.      Expected Outcomes Short Term: Increase workloads from initial exercise prescription for resistance, speed, and METs.;Short Term: Perform resistance training exercises routinely during rehab and add in resistance training at home;Long Term: Improve cardiorespiratory fitness, muscular endurance and strength as measured by increased METs and functional capacity (6MWT) Short Term: Increase workloads from initial  exercise prescription for resistance,  speed, and METs.;Short Term: Perform resistance training exercises routinely during rehab and add in resistance training at home;Long Term: Improve cardiorespiratory fitness, muscular endurance and strength as measured by increased METs and functional capacity (6MWT)      Able to understand and use rate of perceived exertion (RPE) scale Yes Yes      Intervention Provide education and explanation on how to use RPE scale Provide education and explanation on how to use RPE scale      Expected Outcomes Short Term: Able to use RPE daily in rehab to express subjective intensity level;Long Term:  Able to use RPE to guide intensity level when exercising independently Short Term: Able to use RPE daily in rehab to express subjective intensity level;Long Term:  Able to use RPE to guide intensity level when exercising independently      Knowledge and understanding of Target Heart Rate Range (THRR) Yes Yes      Intervention Provide education and explanation of THRR including how the numbers were predicted and where they are located for reference Provide education and explanation of THRR including how the numbers were predicted and where they are located for reference      Expected Outcomes Short Term: Able to state/look up THRR;Long Term: Able to use THRR to govern intensity when exercising independently;Short Term: Able to use daily as guideline for intensity in rehab Short Term: Able to state/look up THRR;Long Term: Able to use THRR to govern intensity when exercising independently;Short Term: Able to use daily as guideline for intensity in rehab      Able to check pulse independently Yes Yes      Intervention Review the importance of being able to check your own pulse for safety during independent exercise;Provide education and demonstration on how to check pulse in carotid and radial arteries. Review the importance of being able to check your own pulse for safety during  independent exercise;Provide education and demonstration on how to check pulse in carotid and radial arteries.      Expected Outcomes Short Term: Able to explain why pulse checking is important during independent exercise;Long Term: Able to check pulse independently and accurately Short Term: Able to explain why pulse checking is important during independent exercise;Long Term: Able to check pulse independently and accurately      Understanding of Exercise Prescription Yes Yes      Intervention Provide education, explanation, and written materials on patient's individual exercise prescription Provide education, explanation, and written materials on patient's individual exercise prescription      Expected Outcomes Short Term: Able to explain program exercise prescription;Long Term: Able to explain home exercise prescription to exercise independently Short Term: Able to explain program exercise prescription;Long Term: Able to explain home exercise prescription to exercise independently             Exercise Goals Re-Evaluation :  Exercise Goals Re-Evaluation    Row Name 02/19/20 1027             Exercise Goal Re-Evaluation   Exercise Goals Review Increase Physical Activity;Increase Strength and Stamina;Able to understand and use rate of perceived exertion (RPE) scale;Knowledge and understanding of Target Heart Rate Range (THRR);Able to check pulse independently;Understanding of Exercise Prescription       Comments Patient has completed 1 exercise session. He tolerated exercise well with no complaints. He had a positive attitude about coming to rehab. He is currently exercising at 1.8 METs on the NuStep. Will continue to monitor and progress as able.  Expected Outcomes Through exercise at rehab and with a home exercise program, patient will reach their goals.               Discharge Exercise Prescription (Final Exercise Prescription Changes):  Exercise Prescription Changes - 02/19/20  1000      Response to Exercise   Blood Pressure (Admit) 160/72    Blood Pressure (Exercise) 178/76    Blood Pressure (Exit) 170/80    Heart Rate (Admit) 76 bpm    Heart Rate (Exercise) 99 bpm    Heart Rate (Exit) 76 bpm    Rating of Perceived Exertion (Exercise) 13    Duration Continue with 30 min of aerobic exercise without signs/symptoms of physical distress.    Intensity THRR unchanged      Progression   Progression Continue to progress workloads to maintain intensity without signs/symptoms of physical distress.      Resistance Training   Training Prescription Yes    Weight 2    Reps 10-15    Time 10 Minutes      NuStep   Level 1    SPM 68    Minutes 22    METs 1.8      Arm Ergometer   Level 1    RPM 48    Minutes 17    METs 1.5           Nutrition:  Target Goals: Understanding of nutrition guidelines, daily intake of sodium 1500mg , cholesterol 200mg , calories 30% from fat and 7% or less from saturated fats, daily to have 5 or more servings of fruits and vegetables.  Biometrics:  Pre Biometrics - 02/19/20 1029      Pre Biometrics   Weight 108.3 kg    BMI (Calculated) 38.55            Nutrition Therapy Plan and Nutrition Goals:  Nutrition Therapy & Goals - 02/18/20 0857      Personal Nutrition Goals   Comments Patient's initial diet assessment score was 69. He says he is folloiwng a "low sugar" diet. We will continues to provide heart healthy nutritional education through handouts.      Intervention Plan   Intervention Nutrition handout(s) given to patient.           Nutrition Assessments:  Nutrition Assessments - 02/14/20 1302      MEDFICTS Scores   Pre Score 69          MEDIFICTS Score Key:  ?70 Need to make dietary changes   40-70 Heart Healthy Diet  ? 40 Therapeutic Level Cholesterol Diet   Picture Your Plate Scores:  <74 Unhealthy dietary pattern with much room for improvement.  41-50 Dietary pattern unlikely to meet  recommendations for good health and room for improvement.  51-60 More healthful dietary pattern, with some room for improvement.   >60 Healthy dietary pattern, although there may be some specific behaviors that could be improved.    Nutrition Goals Re-Evaluation:   Nutrition Goals Discharge (Final Nutrition Goals Re-Evaluation):   Psychosocial: Target Goals: Acknowledge presence or absence of significant depression and/or stress, maximize coping skills, provide positive support system. Participant is able to verbalize types and ability to use techniques and skills needed for reducing stress and depression.  Initial Review & Psychosocial Screening:  Initial Psych Review & Screening - 02/14/20 1254      Initial Review   Current issues with None Identified      Family Dynamics   Good Support System? Yes  Comments His wife is his support system. He talks with his children, grandchildren, and great grandchildren several times per week.      Barriers   Psychosocial barriers to participate in program There are no identifiable barriers or psychosocial needs.      Screening Interventions   Interventions Encouraged to exercise    Expected Outcomes Short Term goal: Utilizing psychosocial counselor, staff and physician to assist with identification of specific Stressors or current issues interfering with healing process. Setting desired goal for each stressor or current issue identified.;Long Term Goal: Stressors or current issues are controlled or eliminated.;Short Term goal: Identification and review with participant of any Quality of Life or Depression concerns found by scoring the questionnaire.;Long Term goal: The participant improves quality of Life and PHQ9 Scores as seen by post scores and/or verbalization of changes           Quality of Life Scores:  Quality of Life - 02/14/20 1414      Quality of Life   Select Quality of Life      Quality of Life Scores   Health/Function  Pre 24 %    Socioeconomic Pre 25.64 %    Psych/Spiritual Pre 24 %    Family Pre 21.6 %    GLOBAL Pre 23.98 %          Scores of 19 and below usually indicate a poorer quality of life in these areas.  A difference of  2-3 points is a clinically meaningful difference.  A difference of 2-3 points in the total score of the Quality of Life Index has been associated with significant improvement in overall quality of life, self-image, physical symptoms, and general health in studies assessing change in quality of life.  PHQ-9: Recent Review Flowsheet Data    Depression screen [facility] 2/9 02/14/2020 12/28/2019 10/01/2019 06/22/2019 06/07/2019   Decreased Interest 0 0 0 0 0   Down, Depressed, Hopeless 0 0 0 0 1   PHQ - 2 Score 0 0 0 0 1   Altered sleeping 0 - - - -   Tired, decreased energy 1 - - - -   Change in appetite 0 - - - -   Feeling bad or failure about yourself  0 - - - -   Trouble concentrating 0 - - - -   Moving slowly or fidgety/restless 0 - - - -   Suicidal thoughts 0 - - - -   PHQ-9 Score 1 - - - -   Difficult doing work/chores Not difficult at all - - - -     Interpretation of Total Score  Total Score Depression Severity:  1-4 = Minimal depression, 5-9 = Mild depression, 10-14 = Moderate depression, 15-19 = Moderately severe depression, 20-27 = Severe depression   Psychosocial Evaluation and Intervention:  Psychosocial Evaluation - 02/14/20 1306      Psychosocial Evaluation & Interventions   Interventions Encouraged to exercise with the program and follow exercise prescription    Comments Patient has no identifiable psychosocial issues at orientation.    Expected Outcomes Patient will continue to not have any identifiable psychosocial issues in the cardiac rehab program.    Continue Psychosocial Services  No Follow up required           Psychosocial Re-Evaluation:  Psychosocial Re-Evaluation    [place] Name 02/18/20 [phone]             Psychosocial Re-Evaluation   Current  issues with None Identified  Comments Patient is new to the program starting today 02/18/20. He has been taking Paxil 20 mg qd since 2012. His initial QOL score was 23.98% overall and his PHQ-9 score was 1. He has no psychosocial issues identified. Will continue to monitor.       Expected Outcomes Patient will have no psychosocial issues identifieda at discharge.       Interventions Stress management education;Encouraged to attend Cardiac Rehabilitation for the exercise;Relaxation education       Continue Psychosocial Services  No Follow up required              Psychosocial Discharge (Final Psychosocial Re-Evaluation):  Psychosocial Re-Evaluation - 02/18/20 0853      Psychosocial Re-Evaluation   Current issues with None Identified    Comments Patient is new to the program starting today 02/18/20. He has been taking Paxil 20 mg qd since 2012. His initial QOL score was 23.98% overall and his PHQ-9 score was 1. He has no psychosocial issues identified. Will continue to monitor.    Expected Outcomes Patient will have no psychosocial issues identifieda at discharge.    Interventions Stress management education;Encouraged to attend Cardiac Rehabilitation for the exercise;Relaxation education    Continue Psychosocial Services  No Follow up required           Vocational Rehabilitation: Provide vocational rehab assistance to qualifying candidates.   Vocational Rehab Evaluation & Intervention:  Vocational Rehab - 02/14/20 1254      Initial Vocational Rehab Evaluation & Intervention   Assessment shows need for Vocational Rehabilitation No      Vocational Rehab Re-Evaulation   Comments He is retired and is not interested in returning to work.           Education: Education Goals: Education classes will be provided on a weekly basis, covering required topics. Participant will state understanding/return demonstration of topics presented.  Learning Barriers/Preferences:  Learning  Barriers/Preferences - 02/14/20 1255      Learning Barriers/Preferences   Learning Barriers Hearing   cannot hear out of right ear   Learning Preferences Audio;Video;Computer/Internet;Group Instruction;Individual Instruction;Pictoral;Skilled Demonstration;Verbal Instruction;Written Material           Education Topics: Hypertension, Hypertension Reduction -Define heart disease and high blood pressure. Discus how high blood pressure affects the body and ways to reduce high blood pressure.   Exercise and Your Heart -Discuss why it is important to exercise, the FITT principles of exercise, normal and abnormal responses to exercise, and how to exercise safely.   Angina -Discuss definition of angina, causes of angina, treatment of angina, and how to decrease risk of having angina.   Cardiac Medications -Review what the following cardiac medications are used for, how they affect the body, and side effects that may occur when taking the medications.  Medications include Aspirin, Beta blockers, calcium channel blockers, ACE Inhibitors, angiotensin receptor blockers, diuretics, digoxin, and antihyperlipidemics.   Congestive Heart Failure -Discuss the definition of CHF, how to live with CHF, the signs and symptoms of CHF, and how keep track of weight and sodium intake.   Heart Disease and Intimacy -Discus the effect sexual activity has on the heart, how changes occur during intimacy as we age, and safety during sexual activity.   Smoking Cessation / COPD -Discuss different methods to quit smoking, the health benefits of quitting smoking, and the definition of COPD.   Nutrition I: Fats -Discuss the types of cholesterol, what cholesterol does to the heart, and how cholesterol levels can be controlled.  Nutrition II: Labels -Discuss the different components of food labels and how to read food label   Heart Parts/Heart Disease and PAD -Discuss the anatomy of the heart, the pathway  of blood circulation through the heart, and these are affected by heart disease.   Stress I: Signs and Symptoms -Discuss the causes of stress, how stress may lead to anxiety and depression, and ways to limit stress.   Stress II: Relaxation -Discuss different types of relaxation techniques to limit stress.   Warning Signs of Stroke / TIA -Discuss definition of a stroke, what the signs and symptoms are of a stroke, and how to identify when someone is having stroke.   Knowledge Questionnaire Score:  Knowledge Questionnaire Score - 02/14/20 1300      Knowledge Questionnaire Score   Pre Score 20/24           Core Components/Risk Factors/Patient Goals at Admission:  Personal Goals and Risk Factors at Admission - 02/14/20 1256      Core Components/Risk Factors/Patient Goals on Admission    Weight Management Yes;Weight Loss   He would like to lose 10 lbs while in the program   Intervention Weight Management: Develop a combined nutrition and exercise program designed to reach desired caloric intake, while maintaining appropriate intake of nutrient and fiber, sodium and fats, and appropriate energy expenditure required for the weight goal.;Weight Management: Provide education and appropriate resources to help participant work on and attain dietary goals.;Weight Management/Obesity: Establish reasonable short term and long term weight goals.;Obesity: Provide education and appropriate resources to help participant work on and attain dietary goals.    Expected Outcomes Short Term: Continue to assess and modify interventions until short term weight is achieved;Long Term: Adherence to nutrition and physical activity/exercise program aimed toward attainment of established weight goal;Weight Maintenance: Understanding of the daily nutrition guidelines, which includes 25-35% calories from fat, 7% or less cal from saturated fats, less than 200mg  cholesterol, less than 1.5gm of sodium, & 5 or more servings  of fruits and vegetables daily;Weight Loss: Understanding of general recommendations for a balanced deficit meal plan, which promotes 1-2 lb weight loss per week and includes a negative energy balance of [phone] kcal/d;Understanding recommendations for meals to include 15-35% energy as protein, 25-35% energy from fat, 35-60% energy from carbohydrates, less than 200mg  of dietary cholesterol, 20-35 gm of total fiber daily;Understanding of distribution of calorie intake throughout the day with the consumption of 4-5 meals/snacks;Weight Gain: Understanding of general recommendations for a high calorie, high protein meal plan that promotes weight gain by distributing calorie intake throughout the day with the consumption for 4-5 meals, snacks, and/or supplements    Improve shortness of breath with ADL's Yes    Intervention Provide education, individualized exercise plan and daily activity instruction to help decrease symptoms of SOB with activities of daily living.    Expected Outcomes Short Term: Improve cardiorespiratory fitness to achieve a reduction of symptoms when performing ADLs;Long Term: Be able to perform more ADLs without symptoms or delay the onset of symptoms    Diabetes Yes    Intervention Provide education about signs/symptoms and action to take for hypo/hyperglycemia.;Provide education about proper nutrition, including hydration, and aerobic/resistive exercise prescription along with prescribed medications to achieve blood glucose in normal ranges: Fasting glucose 65-99 mg/dL    Expected Outcomes Short Term: Participant verbalizes understanding of the signs/symptoms and immediate care of hyper/hypoglycemia, proper foot care and importance of medication, aerobic/resistive exercise and nutrition plan for blood glucose control.;Long Term:  Attainment of HbA1C < 7%.    Hypertension Yes    Intervention Provide education on lifestyle modifcations including regular physical activity/exercise, weight  management, moderate sodium restriction and increased consumption of fresh fruit, vegetables, and low fat dairy, alcohol moderation, and smoking cessation.;Monitor prescription use compliance.    Expected Outcomes Short Term: Continued assessment and intervention until BP is < 140/67mm HG in hypertensive participants. < 130/63mm HG in hypertensive participants with diabetes, heart failure or chronic kidney disease.;Long Term: Maintenance of blood pressure at goal levels.    Lipids Yes    Intervention Provide education and support for participant on nutrition & aerobic/resistive exercise along with prescribed medications to achieve LDL 70mg , HDL >40mg .    Expected Outcomes Short Term: Participant states understanding of desired cholesterol values and is compliant with medications prescribed. Participant is following exercise prescription and nutrition guidelines.;Long Term: Cholesterol controlled with medications as prescribed, with individualized exercise RX and with personalized nutrition plan. Value goals: LDL < 70mg , HDL > 40 mg.           Core Components/Risk Factors/Patient Goals Review:   Goals and Risk Factor Review    Row Name 02/18/20 0859             Core Components/Risk Factors/Patient Goals Review   Personal Goals Review Weight Management/Obesity;Diabetes       Review Patient is new to the program starting today 02/18/20. He has multiple risk factors for CAD and is participating in cardiac rehab for risk modification. His personal goals for the program are to lose 10 lbs in the program and more long term and to get stronger. Will continue to monitor as he works toward meeting these goals.       Expected Outcomes Patient will complete the program meeting both personal and program goals.              Core Components/Risk Factors/Patient Goals at Discharge (Final Review):   Goals and Risk Factor Review - 02/18/20 0859      Core Components/Risk Factors/Patient Goals Review    Personal Goals Review Weight Management/Obesity;Diabetes    Review Patient is new to the program starting today 02/18/20. He has multiple risk factors for CAD and is participating in cardiac rehab for risk modification. His personal goals for the program are to lose 10 lbs in the program and more long term and to get stronger. Will continue to monitor as he works toward meeting these goals.    Expected Outcomes Patient will complete the program meeting both personal and program goals.           ITP Comments:   Comments: ITP REVIEW Pt is making expected progress toward Cardiac Rehab goals after completing 2 sessions. Recommend continued exercise, life style modification, education, and increased stamina and strength.

## 2020-02-22 ENCOUNTER — Other Ambulatory Visit: Payer: Self-pay

## 2020-02-22 ENCOUNTER — Encounter (HOSPITAL_COMMUNITY)
Admission: RE | Admit: 2020-02-22 | Discharge: 2020-02-22 | Disposition: A | Discharge status: Home / Self Care | Payer: Medicare Other | Source: Ambulatory Visit | Attending: Cardiology | Admitting: Cardiology

## 2020-02-22 DIAGNOSIS — Z952 Presence of prosthetic heart valve: Secondary | ICD-10-CM

## 2020-02-22 DIAGNOSIS — Z955 Presence of coronary angioplasty implant and graft: Secondary | ICD-10-CM | POA: Diagnosis not present

## 2020-02-22 NOTE — Progress Notes (Signed)
Daily Session Note  Patient Details  Name: [name] MRN: [id number] Date of Birth: [birth year] Referring Provider:   Flowsheet Row CARDIAC REHAB PHASE II ORIENTATION from 02/14/2020 in [place]  Referring Provider Domenic Polite      Encounter Date: 02/22/2020  Check In:  Session Check In - 02/22/20 1445      Check-In   Supervising physician immediately available to respond to emergencies [facility] MD immediately available    Physician(s) Dr. Johnsie Cancel    Location [facility]    Staff Present Hoy Register, MS, ACSM-CEP, Exercise Physiologist;Madison Audria Nine, MS, Exercise Physiologist;Phyllis Billingsley, Kermit Balo, RN, BSN    Virtual Visit No    Medication changes reported     No    Fall or balance concerns reported    No    Tobacco Cessation No Change    Warm-up and Cool-down Performed as group-led instruction    Resistance Training Performed Yes    VAD Patient? No    PAD/SET Patient? No      Pain Assessment   Currently in Pain? No/denies    Multiple Pain Sites No           Capillary Blood Glucose: No results found for this or any previous visit (from the past 24 hour(s)).    Social History   Tobacco Use  Smoking Status Former Smoker  . Packs/day: 1.00  . Years: 20.00  . Pack years: 20.00  . Types: Cigarettes  . Start date: 01/18/1963  . Quit date: 08/11/1988  . Years since quitting: 31.5  Smokeless Tobacco Former Systems developer  . Types: Chew  . Quit date: 01/11/1989  Tobacco Comment   chewed 1 pack tobacco/day for 15 years    Goals Met:  Independence with exercise equipment Exercise tolerated well No report of cardiac concerns or symptoms Strength training completed today  Goals Unmet:  Not Applicable  Comments: checkout time is 1545   Dr. Kathie Dike is Medical Director for [facility] Pulmonary Re[facility].

## 2020-02-25 ENCOUNTER — Other Ambulatory Visit: Payer: Self-pay

## 2020-02-25 ENCOUNTER — Encounter (HOSPITAL_COMMUNITY)
Admission: RE | Admit: 2020-02-25 | Discharge: 2020-02-25 | Disposition: A | Discharge status: Home / Self Care | Payer: Medicare Other | Source: Ambulatory Visit | Attending: Cardiology | Admitting: Cardiology

## 2020-02-25 DIAGNOSIS — Z955 Presence of coronary angioplasty implant and graft: Secondary | ICD-10-CM | POA: Diagnosis not present

## 2020-02-25 DIAGNOSIS — Z952 Presence of prosthetic heart valve: Secondary | ICD-10-CM

## 2020-02-25 NOTE — Progress Notes (Signed)
Daily Session Note  Patient Details  Name: [name] MRN: [id number] Date of Birth: [birth year] Referring Provider:   Flowsheet Row CARDIAC REHAB PHASE II ORIENTATION from 02/14/2020 in [place]  Referring Provider Domenic Polite      Encounter Date: 02/25/2020  Check In:  Session Check In - 02/25/20 1510      Check-In   Supervising physician immediately available to respond to emergencies [facility] MD immediately available    Physician(s) Dr. Johnsie Cancel    Location [facility]    Staff Present Aundra Dubin, RN, BSN;Ahmad Vanwey Audria Nine, MS, Exercise Physiologist    Virtual Visit No    Medication changes reported     No    Fall or balance concerns reported    No    Tobacco Cessation No Change    Warm-up and Cool-down Performed as group-led instruction    Resistance Training Performed Yes    VAD Patient? No    PAD/SET Patient? No      Pain Assessment   Currently in Pain? No/denies    Multiple Pain Sites No           Capillary Blood Glucose: No results found for this or any previous visit (from the past 24 hour(s)).    Social History   Tobacco Use  Smoking Status Former Smoker  . Packs/day: 1.00  . Years: 20.00  . Pack years: 20.00  . Types: Cigarettes  . Start date: 01/18/1963  . Quit date: 08/11/1988  . Years since quitting: 31.5  Smokeless Tobacco Former Systems developer  . Types: Chew  . Quit date: 01/11/1989  Tobacco Comment   chewed 1 pack tobacco/day for 15 years    Goals Met:  Independence with exercise equipment Exercise tolerated well No report of cardiac concerns or symptoms Strength training completed today  Goals Unmet:  Not Applicable  Comments: check out 1545   Dr. Kathie Dike is Medical Director for [facility] Re[facility].

## 2020-02-27 ENCOUNTER — Other Ambulatory Visit: Payer: Self-pay

## 2020-02-27 ENCOUNTER — Encounter (HOSPITAL_COMMUNITY)
Admission: RE | Admit: 2020-02-27 | Discharge: 2020-02-27 | Disposition: A | Discharge status: Home / Self Care | Payer: Medicare Other | Source: Ambulatory Visit | Attending: Cardiology | Admitting: Cardiology

## 2020-02-27 DIAGNOSIS — Z952 Presence of prosthetic heart valve: Secondary | ICD-10-CM

## 2020-02-27 DIAGNOSIS — Z955 Presence of coronary angioplasty implant and graft: Secondary | ICD-10-CM | POA: Diagnosis not present

## 2020-02-27 NOTE — Progress Notes (Signed)
Daily Session Note  Patient Details  Name: [name] MRN: [id number] Date of Birth: [birth year] Referring Provider:   Flowsheet Row CARDIAC REHAB PHASE II ORIENTATION from 02/14/2020 in [place]  Referring Provider Domenic Polite      Encounter Date: 02/27/2020  Check In:  Session Check In - 02/27/20 1445      Check-In   Supervising physician immediately available to respond to emergencies [facility] MD immediately available    Physician(s) Dr. Johnsie Cancel    Location [facility]    Staff Present Aundra Dubin, RN, BSN;Madison Audria Nine, MS, Exercise Physiologist;Shajuan Musso Kris Mouton, MS, ACSM-CEP, Exercise Physiologist    Virtual Visit No    Medication changes reported     No    Fall or balance concerns reported    No    Tobacco Cessation No Change    Warm-up and Cool-down Performed as group-led instruction    Resistance Training Performed Yes    VAD Patient? No    PAD/SET Patient? No      Pain Assessment   Currently in Pain? No/denies    Multiple Pain Sites No           Capillary Blood Glucose: No results found for this or any previous visit (from the past 24 hour(s)).    Social History   Tobacco Use  Smoking Status Former Smoker  . Packs/day: 1.00  . Years: 20.00  . Pack years: 20.00  . Types: Cigarettes  . Start date: 01/18/1963  . Quit date: 08/11/1988  . Years since quitting: 31.5  Smokeless Tobacco Former Systems developer  . Types: Chew  . Quit date: 01/11/1989  Tobacco Comment   chewed 1 pack tobacco/day for 15 years    Goals Met:  Independence with exercise equipment Exercise tolerated well No report of cardiac concerns or symptoms Strength training completed today  Goals Unmet:  Not Applicable  Comments: checkout time is 1545   Dr. Kathie Dike is Medical Director for [facility].

## 2020-02-29 ENCOUNTER — Other Ambulatory Visit: Payer: Self-pay

## 2020-02-29 ENCOUNTER — Encounter (HOSPITAL_COMMUNITY)
Admission: RE | Admit: 2020-02-29 | Discharge: 2020-02-29 | Disposition: A | Discharge status: Home / Self Care | Payer: Medicare Other | Source: Ambulatory Visit | Attending: Cardiology | Admitting: Cardiology

## 2020-02-29 DIAGNOSIS — Z952 Presence of prosthetic heart valve: Secondary | ICD-10-CM | POA: Diagnosis not present

## 2020-02-29 DIAGNOSIS — Z955 Presence of coronary angioplasty implant and graft: Secondary | ICD-10-CM

## 2020-02-29 NOTE — Progress Notes (Signed)
Daily Session Note  Patient Details  Name: [name] MRN: [id number] Date of Birth: 1940/04/07 Referring Provider:   Flowsheet Row CARDIAC REHAB PHASE II ORIENTATION from 02/14/2020 in [place]  Referring Provider Domenic Polite      Encounter Date: 02/29/2020  Check In:  Session Check In - 02/29/20 1445      Check-In   Supervising physician immediately available to respond to emergencies [facility] MD immediately available    Physician(s) Dr. Johnsie Cancel    Location [facility]    Staff Present Aundra Dubin, RN, BSN;Giovany Cosby Audria Nine, MS, Exercise Physiologist    Virtual Visit No    Medication changes reported     No    Fall or balance concerns reported    No    Tobacco Cessation No Change    Warm-up and Cool-down Performed as group-led instruction    Resistance Training Performed Yes    VAD Patient? No    PAD/SET Patient? No      Pain Assessment   Currently in Pain? No/denies    Multiple Pain Sites No           Capillary Blood Glucose: No results found for this or any previous visit (from the past 24 hour(s)).    Social History   Tobacco Use  Smoking Status Former Smoker  . Packs/day: 1.00  . Years: 20.00  . Pack years: 20.00  . Types: Cigarettes  . Start date: 01/18/1963  . Quit date: 08/11/1988  . Years since quitting: 31.5  Smokeless Tobacco Former Systems developer  . Types: Chew  . Quit date: 01/11/1989  Tobacco Comment   chewed 1 pack tobacco/day for 15 years    Goals Met:  Independence with exercise equipment Exercise tolerated well No report of cardiac concerns or symptoms Strength training completed today  Goals Unmet:  Not Applicable  Comments: check out 1545    Dr. Kathie Dike is Medical Director for [facility] Pulmonary Rehab.

## 2020-03-03 ENCOUNTER — Encounter (HOSPITAL_COMMUNITY)
Admission: RE | Admit: 2020-03-03 | Discharge: 2020-03-03 | Disposition: A | Discharge status: Home / Self Care | Payer: Medicare Other | Source: Ambulatory Visit | Attending: Cardiology | Admitting: Cardiology

## 2020-03-03 ENCOUNTER — Other Ambulatory Visit: Payer: Self-pay

## 2020-03-03 VITALS — Wt 238.8 lb

## 2020-03-03 DIAGNOSIS — Z955 Presence of coronary angioplasty implant and graft: Secondary | ICD-10-CM

## 2020-03-03 DIAGNOSIS — Z952 Presence of prosthetic heart valve: Secondary | ICD-10-CM | POA: Diagnosis not present

## 2020-03-03 NOTE — Progress Notes (Signed)
Daily Session Note  Patient Details  Name: [name] MRN: [id number] Date of Birth: 04-24-1940 Referring Provider:   Flowsheet Row CARDIAC REHAB PHASE II ORIENTATION from 02/14/2020 in [place]  Referring Provider Domenic Polite      Encounter Date: 03/03/2020  Check In:  Session Check In - 03/03/20 1445      Check-In   Supervising physician immediately available to respond to emergencies [facility] MD immediately available    Physician(s) Dr. Johnsie Cancel    Location [facility]    Staff Present Cathren Harsh, MS, Exercise Physiologist;Dalton Kris Mouton, MS, ACSM-CEP, Exercise Physiologist    Virtual Visit No    Medication changes reported     No    Fall or balance concerns reported    No    Tobacco Cessation No Change    Warm-up and Cool-down Performed as group-led instruction    Resistance Training Performed Yes    VAD Patient? No    PAD/SET Patient? No      Pain Assessment   Currently in Pain? No/denies    Multiple Pain Sites No           Capillary Blood Glucose: No results found for this or any previous visit (from the past 24 hour(s)).    Social History   Tobacco Use  Smoking Status Former Smoker  . Packs/day: 1.00  . Years: 20.00  . Pack years: 20.00  . Types: Cigarettes  . Start date: 01/18/1963  . Quit date: 08/11/1988  . Years since quitting: 31.5  Smokeless Tobacco Former Systems developer  . Types: Chew  . Quit date: 01/11/1989  Tobacco Comment   chewed 1 pack tobacco/day for 15 years    Goals Met:  Independence with exercise equipment Exercise tolerated well No report of cardiac concerns or symptoms Strength training completed today  Goals Unmet:  Not Applicable  Comments: checkout 1545   Dr. Kathie Dike is Medical Director for [facility] Pulmonary Rehab.

## 2020-03-05 ENCOUNTER — Other Ambulatory Visit: Payer: Self-pay

## 2020-03-05 ENCOUNTER — Encounter (HOSPITAL_COMMUNITY)
Admission: RE | Admit: 2020-03-05 | Discharge: 2020-03-05 | Disposition: A | Discharge status: Home / Self Care | Payer: Medicare Other | Source: Ambulatory Visit | Attending: Cardiology | Admitting: Cardiology

## 2020-03-05 DIAGNOSIS — Z952 Presence of prosthetic heart valve: Secondary | ICD-10-CM | POA: Diagnosis not present

## 2020-03-05 DIAGNOSIS — Z955 Presence of coronary angioplasty implant and graft: Secondary | ICD-10-CM | POA: Diagnosis not present

## 2020-03-05 NOTE — Progress Notes (Signed)
Daily Session Note  Patient Details  Name: [name] MRN: [id number] Date of Birth: 1940-02-13 Referring Provider:   Flowsheet Row CARDIAC REHAB PHASE II ORIENTATION from 02/14/2020 in [place]  Referring Provider Domenic Polite      Encounter Date: 03/05/2020  Check In:  Session Check In - 03/05/20 1445      Check-In   Supervising physician immediately available to respond to emergencies [facility] MD immediately available    Physician(s) Dr. Domenic Polite    Location [facility]    Staff Present Cathren Harsh, MS, Exercise Physiologist;Debra Wynetta Emery, RN, BSN    Virtual Visit No    Medication changes reported     No    Fall or balance concerns reported    No    Tobacco Cessation No Change    Warm-up and Cool-down Performed as group-led instruction    Resistance Training Performed Yes    VAD Patient? No    PAD/SET Patient? No      Pain Assessment   Currently in Pain? No/denies    Multiple Pain Sites No           Capillary Blood Glucose: No results found for this or any previous visit (from the past 24 hour(s)).    Social History   Tobacco Use  Smoking Status Former Smoker  . Packs/day: 1.00  . Years: 20.00  . Pack years: 20.00  . Types: Cigarettes  . Start date: 01/18/1963  . Quit date: 08/11/1988  . Years since quitting: 31.5  Smokeless Tobacco Former Systems developer  . Types: Chew  . Quit date: 01/11/1989  Tobacco Comment   chewed 1 pack tobacco/day for 15 years    Goals Met:  Independence with exercise equipment Exercise tolerated well No report of cardiac concerns or symptoms Strength training completed today  Goals Unmet:  Not Applicable  Comments: checkout 1545   Dr. Kathie Dike is Medical Director for [facility] Pul[facility] Rehab.

## 2020-03-07 ENCOUNTER — Encounter (HOSPITAL_COMMUNITY)
Admission: RE | Admit: 2020-03-07 | Discharge: 2020-03-07 | Disposition: A | Discharge status: Home / Self Care | Payer: Medicare Other | Source: Ambulatory Visit | Attending: Cardiology | Admitting: Cardiology

## 2020-03-07 ENCOUNTER — Other Ambulatory Visit: Payer: Self-pay

## 2020-03-07 DIAGNOSIS — Z952 Presence of prosthetic heart valve: Secondary | ICD-10-CM | POA: Diagnosis not present

## 2020-03-07 DIAGNOSIS — Z955 Presence of coronary angioplasty implant and graft: Secondary | ICD-10-CM | POA: Diagnosis not present

## 2020-03-07 NOTE — Progress Notes (Signed)
Daily Session Note  Patient Details  Name: [name] MRN: [id number] Date of Birth: Dec 02, 1940 Referring Provider:   Flowsheet Row CARDIAC REHAB PHASE II ORIENTATION from 02/14/2020 in [place]  Referring Provider Domenic Polite      Encounter Date: 03/07/2020  Check In:  Session Check In - 03/07/20 1445      Check-In   Supervising physician immediately available to respond to emergencies [facility] MD immediately available    Physician(s) Dr. Domenic Polite    Location [facility]    Staff Present Cathren Harsh, MS, Exercise Physiologist;Dalton Kris Mouton, MS, ACSM-CEP, Exercise Physiologist    Virtual Visit No    Medication changes reported     No    Fall or balance concerns reported    No    Tobacco Cessation No Change    Warm-up and Cool-down Performed as group-led instruction    Resistance Training Performed Yes    VAD Patient? No    PAD/SET Patient? No      Pain Assessment   Currently in Pain? No/denies    Multiple Pain Sites No           Capillary Blood Glucose: No results found for this or any previous visit (from the past 24 hour(s)).    Social History   Tobacco Use  Smoking Status Former Smoker  . Packs/day: 1.00  . Years: 20.00  . Pack years: 20.00  . Types: Cigarettes  . Start date: 01/18/1963  . Quit date: 08/11/1988  . Years since quitting: 31.5  Smokeless Tobacco Former Systems developer  . Types: Chew  . Quit date: 01/11/1989  Tobacco Comment   chewed 1 pack tobacco/day for 15 years    Goals Met:  Independence with exercise equipment Exercise tolerated well No report of cardiac concerns or symptoms Strength training completed today  Goals Unmet:  Not Applicable  Comments: checkout 1545   Dr. Kathie Dike is Medical Director for [facility] Re[facility].

## 2020-03-10 ENCOUNTER — Encounter (HOSPITAL_COMMUNITY)
Admission: RE | Admit: 2020-03-10 | Discharge: 2020-03-10 | Disposition: A | Discharge status: Home / Self Care | Payer: Medicare Other | Source: Ambulatory Visit | Attending: Cardiology | Admitting: Cardiology

## 2020-03-10 ENCOUNTER — Other Ambulatory Visit: Payer: Self-pay

## 2020-03-10 DIAGNOSIS — Z955 Presence of coronary angioplasty implant and graft: Secondary | ICD-10-CM

## 2020-03-10 DIAGNOSIS — Z952 Presence of prosthetic heart valve: Secondary | ICD-10-CM

## 2020-03-10 NOTE — Progress Notes (Signed)
Daily Session Note  Patient Details  Name: [name] MRN: [id number] Date of Birth: January 04, 1941 Referring Provider:   Flowsheet Row CARDIAC REHAB PHASE II ORIENTATION from 02/14/2020 in [place]  Referring Provider Domenic Polite      Encounter Date: 03/10/2020  Check In:  Session Check In - 03/10/20 1445      Check-In   Supervising physician immediately available to respond to emergencies [facility] MD immediately available    Physician(s) Dr. Harl Bowie    Location [facility]    Staff Present Aundra Dubin, RN, BSN;Madison Audria Nine, MS, Exercise Physiologist    Virtual Visit No    Medication changes reported     No    Fall or balance concerns reported    No    Tobacco Cessation No Change    Warm-up and Cool-down Performed as group-led instruction    Resistance Training Performed Yes    VAD Patient? No    PAD/SET Patient? No      Pain Assessment   Currently in Pain? No/denies    Multiple Pain Sites No           Capillary Blood Glucose: No results found for this or any previous visit (from the past 24 hour(s)).    Social History   Tobacco Use  Smoking Status Former Smoker  . Packs/day: 1.00  . Years: 20.00  . Pack years: 20.00  . Types: Cigarettes  . Start date: 01/18/1963  . Quit date: 08/11/1988  . Years since quitting: 31.6  Smokeless Tobacco Former Systems developer  . Types: Chew  . Quit date: 01/11/1989  Tobacco Comment   chewed 1 pack tobacco/day for 15 years    Goals Met:  Independence with exercise equipment Exercise tolerated well No report of cardiac concerns or symptoms Strength training completed today  Goals Unmet:  Not Applicable  Comments: check out 1545   Dr. Kathie Dike is Medical Director for [facility] Pulmonary Re[facility].

## 2020-03-12 ENCOUNTER — Other Ambulatory Visit: Payer: Self-pay | Admitting: Physician Assistant

## 2020-03-12 ENCOUNTER — Encounter (HOSPITAL_COMMUNITY)
Admission: RE | Admit: 2020-03-12 | Discharge: 2020-03-12 | Disposition: A | Discharge status: Home / Self Care | Payer: Medicare Other | Source: Ambulatory Visit | Attending: Cardiology | Admitting: Cardiology

## 2020-03-12 ENCOUNTER — Other Ambulatory Visit: Payer: Self-pay

## 2020-03-12 DIAGNOSIS — Z952 Presence of prosthetic heart valve: Secondary | ICD-10-CM | POA: Diagnosis not present

## 2020-03-12 DIAGNOSIS — Z955 Presence of coronary angioplasty implant and graft: Secondary | ICD-10-CM | POA: Diagnosis not present

## 2020-03-12 NOTE — Progress Notes (Signed)
Daily Session Note  Patient Details  Name: SABINO DENNING MRN: [id number] Date of Birth: 1940/06/28 Referring Provider:   Flowsheet Row CARDIAC REHAB PHASE II ORIENTATION from 02/14/2020 in [place]  Referring Provider Domenic Polite      Encounter Date: 03/12/2020  Check In:  Session Check In - 03/12/20 1522      Check-In   Supervising physician immediately available to respond to emergencies [facility] MD immediately available    Physician(s) Dr. Harl Bowie    Location [facility]    Staff Present Aundra Dubin, RN, BSN;Phyllis Billingsley, RN;Madison Audria Nine, MS, Exercise Physiologist;Dalton Kris Mouton, MS, ACSM-CEP, Exercise Physiologist    Virtual Visit No    Medication changes reported     No    Fall or balance concerns reported    No    Tobacco Cessation No Change    Warm-up and Cool-down Performed as group-led instruction    Resistance Training Performed Yes    VAD Patient? No    PAD/SET Patient? No      Pain Assessment   Currently in Pain? No/denies    Multiple Pain Sites No           Capillary Blood Glucose: No results found for this or any previous visit (from the past 24 hour(s)).    Social History   Tobacco Use  Smoking Status Former Smoker  . Packs/day: 1.00  . Years: 20.00  . Pack years: 20.00  . Types: Cigarettes  . Start date: 01/18/1963  . Quit date: 08/11/1988  . Years since quitting: 31.6  Smokeless Tobacco Former Systems developer  . Types: Chew  . Quit date: 01/11/1989  Tobacco Comment   chewed 1 pack tobacco/day for 15 years    Goals Met:  Independence with exercise equipment Exercise tolerated well No report of cardiac concerns or symptoms Strength training completed today  Goals Unmet:  Not Applicable  Comments: Check out 1645.   Dr. Kathie Dike is Medical Director for [facility] Pulmonary Rehab.

## 2020-03-14 ENCOUNTER — Other Ambulatory Visit: Payer: Self-pay

## 2020-03-14 ENCOUNTER — Encounter (HOSPITAL_COMMUNITY)
Admission: RE | Admit: 2020-03-14 | Discharge: 2020-03-14 | Disposition: A | Discharge status: Home / Self Care | Payer: Medicare Other | Source: Ambulatory Visit | Attending: Cardiology | Admitting: Cardiology

## 2020-03-14 DIAGNOSIS — Z952 Presence of prosthetic heart valve: Secondary | ICD-10-CM | POA: Diagnosis not present

## 2020-03-14 DIAGNOSIS — Z955 Presence of coronary angioplasty implant and graft: Secondary | ICD-10-CM | POA: Diagnosis not present

## 2020-03-14 NOTE — Progress Notes (Signed)
Daily Session Note  Patient Details  Name: [name] MRN: [id number] Date of Birth: 1940/11/15 Referring Provider:   Flowsheet Row CARDIAC REHAB PHASE II ORIENTATION from 02/14/2020 in [place]  Referring Provider Domenic Polite      Encounter Date: 03/14/2020  Check In:  Session Check In - 03/14/20 1445      Check-In   Supervising physician immediately available to respond to emergencies [facility] MD immediately available    Physician(s) Dr. Domenic Polite    Location [facility]    Staff Present Aundra Dubin, RN, BSN;Phyllis Billingsley, RN;Madison Audria Nine, MS, Exercise Physiologist;Treyshaun Keatts Kris Mouton, MS, ACSM-CEP, Exercise Physiologist    Virtual Visit No    Medication changes reported     No    Fall or balance concerns reported    No    Tobacco Cessation No Change    Warm-up and Cool-down Performed as group-led instruction    Resistance Training Performed Yes    VAD Patient? No    PAD/SET Patient? No      Pain Assessment   Currently in Pain? No/denies    Multiple Pain Sites No           Capillary Blood Glucose: No results found for this or any previous visit (from the past 24 hour(s)).    Social History   Tobacco Use  Smoking Status Former Smoker  . Packs/day: 1.00  . Years: 20.00  . Pack years: 20.00  . Types: Cigarettes  . Start date: 01/18/1963  . Quit date: 08/11/1988  . Years since quitting: 31.6  Smokeless Tobacco Former Systems developer  . Types: Chew  . Quit date: 01/11/1989  Tobacco Comment   chewed 1 pack tobacco/day for 15 years    Goals Met:  Independence with exercise equipment Exercise tolerated well No report of cardiac concerns or symptoms Strength training completed today  Goals Unmet:  Not Applicable  Comments: checkout time is 1545   Dr. Kathie Dike is Medical Director for [facility].

## 2020-03-17 ENCOUNTER — Encounter (HOSPITAL_COMMUNITY)
Admission: RE | Admit: 2020-03-17 | Discharge: 2020-03-17 | Disposition: A | Discharge status: Home / Self Care | Payer: Medicare Other | Source: Ambulatory Visit | Attending: Cardiology | Admitting: Cardiology

## 2020-03-17 ENCOUNTER — Other Ambulatory Visit: Payer: Self-pay

## 2020-03-17 DIAGNOSIS — Z952 Presence of prosthetic heart valve: Secondary | ICD-10-CM | POA: Diagnosis not present

## 2020-03-17 DIAGNOSIS — Z955 Presence of coronary angioplasty implant and graft: Secondary | ICD-10-CM | POA: Diagnosis not present

## 2020-03-17 NOTE — Progress Notes (Signed)
Daily Session Note  Patient Details  Name: [name] MRN: [id number] Date of Birth: [birth year] Referring Provider:   Flowsheet Row CARDIAC REHAB PHASE II ORIENTATION from 02/14/2020 in [place]  Referring Provider Domenic Polite      Encounter Date: 03/17/2020  Check In:  Session Check In - 03/17/20 1445      Check-In   Supervising physician immediately available to respond to emergencies [facility] MD immediately available    Physician(s) Dr. Domenic Polite    Location [facility]    Staff Present Aundra Dubin, RN, BSN;Madison Audria Nine, MS, Exercise Physiologist;[place] Kris Mouton, MS, ACSM-CEP, Exercise Physiologist    Virtual Visit No    Medication changes reported     No    Fall or balance concerns reported    No    Tobacco Cessation No Change    Warm-up and Cool-down Performed as group-led instruction    Resistance Training Performed Yes    VAD Patient? No    PAD/SET Patient? No      Pain Assessment   Currently in Pain? No/denies    Multiple Pain Sites No           Capillary Blood Glucose: No results found for this or any previous visit (from the past 24 hour(s)).    Social History   Tobacco Use  Smoking Status Former Smoker  . Packs/day: 1.00  . Years: 20.00  . Pack years: 20.00  . Types: Cigarettes  . Start date: 01/18/1963  . Quit date: 08/11/1988  . Years since quitting: 31.6  Smokeless Tobacco Former Systems developer  . Types: Chew  . Quit date: 01/11/1989  Tobacco Comment   chewed 1 pack tobacco/day for 15 years    Goals Met:  Independence with exercise equipment Exercise tolerated well No report of cardiac concerns or symptoms Strength training completed today  Goals Unmet:  Not Applicable  Comments: Check out 1545.   Dr. Kathie Dike is Medical Director for [facility].

## 2020-03-19 ENCOUNTER — Encounter (HOSPITAL_COMMUNITY)
Admission: RE | Admit: 2020-03-19 | Discharge: 2020-03-19 | Disposition: A | Discharge status: Home / Self Care | Payer: Medicare Other | Source: Ambulatory Visit | Attending: Cardiology | Admitting: Cardiology

## 2020-03-19 ENCOUNTER — Other Ambulatory Visit: Payer: Self-pay

## 2020-03-19 DIAGNOSIS — Z952 Presence of prosthetic heart valve: Secondary | ICD-10-CM

## 2020-03-19 DIAGNOSIS — Z955 Presence of coronary angioplasty implant and graft: Secondary | ICD-10-CM

## 2020-03-19 NOTE — Progress Notes (Signed)
Daily Session Note  Patient Details  Name: [name] MRN: [id number] Date of Birth: 1941/01/09 Referring Provider:   Flowsheet Row CARDIAC REHAB PHASE II ORIENTATION from 02/14/2020 in [place]  Referring Provider Domenic Polite      Encounter Date: 03/19/2020  Check In:  Session Check In - 03/19/20 1445      Check-In   Supervising physician immediately available to respond to emergencies [facility] MD immediately available    Physician(s) Dr. Domenic Polite    Location [facility]    Staff Present Aundra Dubin, RN, BSN;Phyllis Billingsley, RN;Madison Audria Nine, MS, Exercise Physiologist    Virtual Visit No    Medication changes reported     No    Fall or balance concerns reported    No    Tobacco Cessation No Change    Warm-up and Cool-down Performed as group-led instruction    Resistance Training Performed Yes    VAD Patient? No    PAD/SET Patient? No      Pain Assessment   Currently in Pain? No/denies    Multiple Pain Sites No           Capillary Blood Glucose: No results found for this or any previous visit (from the past 24 hour(s)).    Social History   Tobacco Use  Smoking Status Former Smoker  . Packs/day: 1.00  . Years: 20.00  . Pack years: 20.00  . Types: Cigarettes  . Start date: 01/18/1963  . Quit date: 08/11/1988  . Years since quitting: 31.6  Smokeless Tobacco Former Systems developer  . Types: Chew  . Quit date: 01/11/1989  Tobacco Comment   chewed 1 pack tobacco/day for 15 years    Goals Met:  Independence with exercise equipment Exercise tolerated well No report of cardiac concerns or symptoms Strength training completed today  Goals Unmet:  Not Applicable  Comments: Check out 1545.   Dr. Kathie Dike is Medical Director for [facility].

## 2020-03-19 NOTE — Progress Notes (Signed)
Cardiac Individual Treatment Plan  Patient Details  Name: [name] MRN: [id number] Date of Birth: 1940-11-27 Referring Provider:   Flowsheet Row CARDIAC REHAB PHASE II ORIENTATION from 02/14/2020 in [facility]  Referring Provider Domenic Polite      Initial Encounter Date:  Flowsheet Row CARDIAC REHAB PHASE II ORIENTATION from 02/14/2020 in [facility]  Date 02/14/20      Visit Diagnosis: S/P TAVR (transcatheter aortic valve replacement)  Status post coronary artery stent placement  Patient's Home Medications on Admission:  Current Outpatient Medications:  .  acetaminophen (TYLENOL) 650 MG CR tablet, Take 1,300 mg by mouth 2 (two) times daily., Disp: , Rfl:  .  ACETAMINOPHEN-BUTALBITAL 50-325 MG TABS, Take by mouth., Disp: , Rfl:  .  amLODipine (NORVASC) 10 MG tablet, Take 1 tablet (10 mg total) by mouth daily., Disp: 90 tablet, Rfl: 1 .  aspirin EC 81 MG tablet, Take 81 mg by mouth at bedtime. , Disp: , Rfl:  .  atorvastatin (LIPITOR) 40 MG tablet, Take 1 tablet (40 mg total) by mouth every evening., Disp: 90 tablet, Rfl: 1 .  butalbital-acetaminophen-caffeine (FIORICET) 50-325-40 MG tablet, Take 1 tablet by mouth 2 (two) times daily as needed for headache., Disp: , Rfl:  .  cetirizine (ZYRTEC) 10 MG tablet, Take 10 mg by mouth daily with supper. , Disp: , Rfl:  .  Cholecalciferol (VITAMIN D3) 50 MCG (2000 UT) capsule, Take 4,000 Units by mouth daily. , Disp: , Rfl:  .  clopidogrel (PLAVIX) 75 MG tablet, Take 1 tablet (75 mg total) by mouth daily. Stop taking on 06/02/20., Disp: 90 tablet, Rfl: 1 .  Continuous Blood Gluc Receiver (DEXCOM G6 RECEIVER) DEVI, USE TO CHECK BLOOD SUGAR UP TO 4 TIMES DAILY AS DIRECTED. 1 DEVICE PER YEAR. DX: E11.9, Disp: 1 each, Rfl: 1 .  Continuous Blood Gluc Sensor (DEXCOM G6 SENSOR) MISC, USE TO CHECK BLOOD SUGAR UP TO 4 TIMES DAILY AS DIRECTED. CHANGE SENSOR EVERY 30 DAYS. DX: E11.9, Disp: 3 each, Rfl: 4 .   Continuous Blood Gluc Transmit (DEXCOM G6 TRANSMITTER) MISC, USE TO CHECK BLOOD SUGAR UP TO 4 TIMES DAILY AS DIRECTED. CHANGE TRANSMITTER EVERY 3 MONTHS. DX: E11.9, Disp: 1 each, Rfl: 12 .  docusate sodium (COLACE) 100 MG capsule, Take 100 mg by mouth daily as needed for mild constipation or moderate constipation. , Disp: , Rfl:  .  donepezil (ARICEPT) 5 MG tablet, Take 5 mg by mouth at bedtime., Disp: , Rfl:  .  famotidine (PEPCID) 20 MG tablet, TAKE 1 TABLET BY MOUTH  TWICE DAILY WITH MEALS, Disp: 180 tablet, Rfl: 0 .  furosemide (LASIX) 20 MG tablet, TAKE 1 TABLET BY MOUTH  DAILY AS NEEDED, Disp: 90 tablet, Rfl: 3 .  glucose blood (ONETOUCH ULTRA) test strip, Check BS TID Dx E11.59, Disp: 400 each, Rfl: 3 .  insulin lispro (HUMALOG) 100 UNIT/ML injection, Inject 0-20 Units into the skin 3 (three) times daily as needed for high blood sugar., Disp: , Rfl:  .  isosorbide mononitrate (IMDUR) 30 MG 24 hr tablet, TAKE 1 TABLET BY MOUTH IN  THE EVENING, Disp: 90 tablet, Rfl: 3 .  LANTUS SOLOSTAR 100 UNIT/ML Solostar Pen, INJECT SUBCUTANEOUSLY 52  UNITS AT BEDTIME (Patient taking differently: Inject 56 Units into the skin at bedtime.), Disp: 60 mL, Rfl: 3 .  metFORMIN (GLUCOPHAGE-XR) 500 MG 24 hr tablet, TAKE 2 TABLETS BY MOUTH  TWICE DAILY, Disp: 360 tablet, Rfl: 0 .  mupirocin ointment (  BACTROBAN) 2 %, Apply 1 application topically 2 (two) times daily., Disp: 22 g, Rfl: 1 .  nitroGLYCERIN (NITROSTAT) 0.4 MG SL tablet, DISSOLVE ONE TABLET UNDER THE TONGUE EVERY 5 MINUTES AS NEEDED FOR CHEST PAIN.  DO NOT EXCEED A TOTAL OF 3 DOSES IN 15 MINUTES, Disp: 25 tablet, Rfl: 3 .  Omega-3 Fatty Acids (FISH OIL) 1200 MG CAPS, Take 1,200-2,400 mg by mouth See admin instructions. Tale 1200 mg in the morning and 2400 mg in the evening, Disp: , Rfl:  .  PARoxetine (PAXIL) 20 MG tablet, TAKE 1 TABLET BY MOUTH  DAILY, Disp: 90 tablet, Rfl: 0 .  tamsulosin (FLOMAX) 0.4 MG CAPS capsule, TAKE 1 CAPSULE BY MOUTH  DAILY, Disp:  90 capsule, Rfl: 0 .  Turmeric Curcumin 500 MG CAPS, Take 500 mg by mouth daily., Disp: , Rfl:  .  vitamin B-12 (CYANOCOBALAMIN) 1000 MCG tablet, Take 2,000 mcg by mouth daily., Disp: , Rfl:   Past Medical History: Past Medical History:  Diagnosis Date  . Anxiety   . Aortic atherosclerosis ([place]) 12/19/2017  . Aortic stenosis    26 mm Edwards S3U THV November 2021  . Arthritis   . Bursitis of hip   . Cervical spondylosis   . Chronic headaches   . Coronary atherosclerosis of native coronary artery    BMS nondominant RCA 12/2004, DES x2 to mid LAD 11/2019  . Essential hypertension   . GERD (gastroesophageal reflux disease)   . History of adenomatous polyp of colon   . History of kidney stones   . History of MI (myocardial infarction) 12/2004  . History of viral meningitis 02/28/2018   December 2019  . Hyperlipidemia   . Internal hemorrhoids   . Low back pain   . Nocturia more than twice per night   . OSA (obstructive sleep apnea)   . Spinal stenosis   . Type 2 diabetes mellitus treated with insulin ([place])   . Wears dentures    Upper    Tobacco Use: Social History   Tobacco Use  Smoking Status Former Smoker  . Packs/day: 1.00  . Years: 20.00  . Pack years: 20.00  . Types: Cigarettes  . Start date: 01/18/1963  . Quit date: 08/11/1988  . Years since quitting: 31.6  Smokeless Tobacco Former Systems developer  . Types: Chew  . Quit date: 01/11/1989  Tobacco Comment   chewed 1 pack tobacco/day for 15 years    Labs: Recent Review Flowsheet Data    Labs for ITP Cardiac and Pulmonary Rehab Latest Ref Rng & Units 11/16/2019 11/30/2019 12/04/2019 12/04/2019 12/28/2019   Cholestrol 100 - 199 mg/dL 123 - - - 118   LDLCALC 0 - 99 mg/dL 66 - - - 61   HDL >39 mg/dL 32(L) - - - 36(L)   Trlycerides 0 - 149 mg/dL 125 - - - 117   Hemoglobin A1c <7.0 % - 8.2(H) - - 7.6(H)   PHART 7.350 - 7.450 - 7.422 - - -   PCO2ART 32.0 - 48.0 mmHg - 40.2 - - -   HCO3 20.0 - 28.0 mmol/L - 25.7 - - -   TCO2 22 -  32 mmol/L - - 24 28 -   O2SAT % - 94.8 - - -      Capillary Blood Glucose: Lab Results  Component Value Date   GLUCAP 224 (H) 12/05/2019   GLUCAP 223 (H) 12/05/2019   GLUCAP 223 (H) 12/04/2019   GLUCAP 264 (H) 12/04/2019   GLUCAP 220 (  H) 12/04/2019    POCT Glucose    Row Name 02/14/20 1440             POCT Blood Glucose   Pre-Exercise 236 mg/dL              Exercise Target Goals: Exercise Program Goal: Individual exercise prescription set using results from initial 6 min walk test and THRR while considering  patient's activity barriers and safety.   Exercise Prescription Goal: Starting with aerobic activity 30 plus minutes a day, 3 days per week for initial exercise prescription. Provide home exercise prescription and guidelines that participant acknowledges understanding prior to discharge.  Activity Barriers & Risk Stratification:  Activity Barriers & Cardiac Risk Stratification - 02/14/20 1253      Activity Barriers & Cardiac Risk Stratification   Activity Barriers Back Problems;Left Hip Replacement;Right Hip Replacement;Left Knee Replacement;Right Knee Replacement;Neck/Spine Problems;Joint Problems;Deconditioning;Shortness of Breath;Balance Concerns    Cardiac Risk Stratification High           6 Minute Walk:  6 Minute Walk    Row Name 02/14/20 1407         6 Minute Walk   Phase Initial     Distance 600 feet     Walk Time 6 minutes     # of Rest Breaks 1  took a 2 minute break     MPH 1.1     METS 0.7     RPE 12     VO2 Peak 2.48     Symptoms Yes (comment)     Comments back pain 5/10, and shortness of breath     Resting HR 62 bpm     Resting BP 146/62     Resting Oxygen Saturation  96 %     Exercise Oxygen Saturation  during 6 min walk 96 %     Max Ex. HR 82 bpm     Max Ex. BP 188/62     2 Minute Post BP 156/62            Oxygen Initial Assessment:   Oxygen Re-Evaluation:   Oxygen Discharge (Final Oxygen Re-Evaluation):   Initial  Exercise Prescription:  Initial Exercise Prescription - 02/14/20 1400      Date of Initial Exercise RX and Referring Provider   Date 02/14/20    Referring Provider Domenic Polite    Expected Discharge Date 05/07/20      NuStep   Level 1    SPM 60    Minutes 22      Arm Ergometer   Level 1    RPM 40    Minutes 17      Prescription Details   Frequency (times per week) 3    Duration Progress to 30 minutes of continuous aerobic without signs/symptoms of physical distress      Intensity   THRR 40-80% of Max Heartrate 56-112    Ratings of Perceived Exertion 11-13      Progression   Progression Continue progressive overload as per policy without signs/symptoms or physical distress.      Resistance Training   Training Prescription Yes    Weight 2    Reps 10-15           Perform Capillary Blood Glucose checks as needed.  Exercise Prescription Changes:  Exercise Prescription Changes    Row Name 02/19/20 1000 03/03/20 0824 03/18/20 1000         Response to Exercise   Blood Pressure (Admit) 160/72 144/62 138/80  Blood Pressure (Exercise) 178/76 170/72 150/60     Blood Pressure (Exit) 170/80 156/76 148/62     Heart Rate (Admit) 76 bpm 68 bpm 72 bpm     Heart Rate (Exercise) 99 bpm 107 bpm 101 bpm     Heart Rate (Exit) 76 bpm 75 bpm 77 bpm     Rating of Perceived Exertion (Exercise) 13 13 12      Duration Continue with 30 min of aerobic exercise without signs/symptoms of physical distress. Continue with 30 min of aerobic exercise without signs/symptoms of physical distress. Continue with 30 min of aerobic exercise without signs/symptoms of physical distress.     Intensity THRR unchanged THRR unchanged THRR unchanged           Progression   Progression Continue to progress workloads to maintain intensity without signs/symptoms of physical distress. Continue to progress workloads to maintain intensity without signs/symptoms of physical distress. Continue to progress workloads  to maintain intensity without signs/symptoms of physical distress.           Resistance Training   Training Prescription Yes Yes Yes     Weight 2 3 lbs 3 lbs     Reps 10-15 10-15 10-15     Time 10 Minutes 10 Minutes 10 Minutes           NuStep   Level 1 1 2      SPM 68 79 81     Minutes 22 22 22      METs 1.8 1.9 1.9           Arm Ergometer   Level 1 1 2      RPM 48 54 56     Minutes 17 17 17      METs 1.5 1.5 2.7            Exercise Comments:  Exercise Comments    Row Name 02/18/20 1557           Exercise Comments Patient completed first exercise session today. He tolerated exercise well with no complaints. He enjoyed rehab today and is enthusiastic to come back.              Exercise Goals and Review:  Exercise Goals    Row Name 02/14/20 1412 02/19/20 1027 03/17/20 1545         Exercise Goals   Increase Physical Activity Yes Yes Yes     Intervention -- Provide advice, education, support and counseling about physical activity/exercise needs.;Develop an individualized exercise prescription for aerobic and resistive training based on initial evaluation findings, risk stratification, comorbidities and participant's personal goals. Provide advice, education, support and counseling about physical activity/exercise needs.;Develop an individualized exercise prescription for aerobic and resistive training based on initial evaluation findings, risk stratification, comorbidities and participant's personal goals.     Expected Outcomes Long Term: Add in home exercise to make exercise part of routine and to increase amount of physical activity.;Short Term: Attend rehab on a regular basis to increase amount of physical activity.;Long Term: Exercising regularly at least 3-5 days a week. Long Term: Add in home exercise to make exercise part of routine and to increase amount of physical activity.;Short Term: Attend rehab on a regular basis to increase amount of physical activity.;Long  Term: Exercising regularly at least 3-5 days a week. Long Term: Add in home exercise to make exercise part of routine and to increase amount of physical activity.;Short Term: Attend rehab on a regular basis to increase amount of physical activity.;Long Term: Exercising regularly  at least 3-5 days a week.     Increase Strength and Stamina Yes Yes Yes     Intervention -- Provide advice, education, support and counseling about physical activity/exercise needs.;Develop an individualized exercise prescription for aerobic and resistive training based on initial evaluation findings, risk stratification, comorbidities and participant's personal goals. Provide advice, education, support and counseling about physical activity/exercise needs.;Develop an individualized exercise prescription for aerobic and resistive training based on initial evaluation findings, risk stratification, comorbidities and participant's personal goals.     Expected Outcomes Short Term: Increase workloads from initial exercise prescription for resistance, speed, and METs.;Short Term: Perform resistance training exercises routinely during rehab and add in resistance training at home;Long Term: Improve cardiorespiratory fitness, muscular endurance and strength as measured by increased METs and functional capacity (6MWT) Short Term: Increase workloads from initial exercise prescription for resistance, speed, and METs.;Short Term: Perform resistance training exercises routinely during rehab and add in resistance training at home;Long Term: Improve cardiorespiratory fitness, muscular endurance and strength as measured by increased METs and functional capacity (6MWT) Short Term: Increase workloads from initial exercise prescription for resistance, speed, and METs.;Short Term: Perform resistance training exercises routinely during rehab and add in resistance training at home;Long Term: Improve cardiorespiratory fitness, muscular endurance and strength as  measured by increased METs and functional capacity (6MWT)     Able to understand and use rate of perceived exertion (RPE) scale Yes Yes Yes     Intervention Provide education and explanation on how to use RPE scale Provide education and explanation on how to use RPE scale Provide education and explanation on how to use RPE scale     Expected Outcomes Short Term: Able to use RPE daily in rehab to express subjective intensity level;Long Term:  Able to use RPE to guide intensity level when exercising independently Short Term: Able to use RPE daily in rehab to express subjective intensity level;Long Term:  Able to use RPE to guide intensity level when exercising independently Short Term: Able to use RPE daily in rehab to express subjective intensity level;Long Term:  Able to use RPE to guide intensity level when exercising independently     Knowledge and understanding of Target Heart Rate Range (THRR) Yes Yes Yes     Intervention Provide education and explanation of THRR including how the numbers were predicted and where they are located for reference Provide education and explanation of THRR including how the numbers were predicted and where they are located for reference Provide education and explanation of THRR including how the numbers were predicted and where they are located for reference     Expected Outcomes Short Term: Able to state/look up THRR;Long Term: Able to use THRR to govern intensity when exercising independently;Short Term: Able to use daily as guideline for intensity in rehab Short Term: Able to state/look up THRR;Long Term: Able to use THRR to govern intensity when exercising independently;Short Term: Able to use daily as guideline for intensity in rehab Short Term: Able to state/look up THRR;Long Term: Able to use THRR to govern intensity when exercising independently;Short Term: Able to use daily as guideline for intensity in rehab     Able to check pulse independently Yes Yes Yes      Intervention Review the importance of being able to check your own pulse for safety during independent exercise;Provide education and demonstration on how to check pulse in carotid and radial arteries. Review the importance of being able to check your own pulse for safety during independent  exercise;Provide education and demonstration on how to check pulse in carotid and radial arteries. Review the importance of being able to check your own pulse for safety during independent exercise;Provide education and demonstration on how to check pulse in carotid and radial arteries.     Expected Outcomes Short Term: Able to explain why pulse checking is important during independent exercise;Long Term: Able to check pulse independently and accurately Short Term: Able to explain why pulse checking is important during independent exercise;Long Term: Able to check pulse independently and accurately Short Term: Able to explain why pulse checking is important during independent exercise;Long Term: Able to check pulse independently and accurately     Understanding of Exercise Prescription Yes Yes Yes     Intervention Provide education, explanation, and written materials on patient's individual exercise prescription Provide education, explanation, and written materials on patient's individual exercise prescription Provide education, explanation, and written materials on patient's individual exercise prescription     Expected Outcomes Short Term: Able to explain program exercise prescription;Long Term: Able to explain home exercise prescription to exercise independently Short Term: Able to explain program exercise prescription;Long Term: Able to explain home exercise prescription to exercise independently Short Term: Able to explain program exercise prescription;Long Term: Able to explain home exercise prescription to exercise independently            Exercise Goals Re-Evaluation :  Exercise Goals Re-Evaluation    Row Name  02/19/20 1027 03/18/20 1013           Exercise Goal Re-Evaluation   Exercise Goals Review Increase Physical Activity;Increase Strength and Stamina;Able to understand and use rate of perceived exertion (RPE) scale;Knowledge and understanding of Target Heart Rate Range (THRR);Able to check pulse independently;Understanding of Exercise Prescription Increase Physical Activity;Increase Strength and Stamina;Able to understand and use rate of perceived exertion (RPE) scale;Knowledge and understanding of Target Heart Rate Range (THRR);Able to check pulse independently;Understanding of Exercise Prescription      Comments Patient has completed 1 exercise session. He tolerated exercise well with no complaints. He had a positive attitude about coming to rehab. He is currently exercising at 1.8 METs on the NuStep. Will continue to monitor and progress as able. Patient has completed 13 exercise sessions. He has tolerated exercise well. He has complained of back pain that prevents him from completing the warm up and strength training part. He is progressing slowly. He is currently exercising at 1.9 METs on the NuStep. Will continue to monitor and progress as able.      Expected Outcomes Through exercise at rehab and with a home exercise program, patient will reach their goals. Through exercise at rehab and with a home exercise program, patient will reach their goals.              Discharge Exercise Prescription (Final Exercise Prescription Changes):  Exercise Prescription Changes - 03/18/20 1000      Response to Exercise   Blood Pressure (Admit) 138/80    Blood Pressure (Exercise) 150/60    Blood Pressure (Exit) 148/62    Heart Rate (Admit) 72 bpm    Heart Rate (Exercise) 101 bpm    Heart Rate (Exit) 77 bpm    Rating of Perceived Exertion (Exercise) 12    Duration Continue with 30 min of aerobic exercise without signs/symptoms of physical distress.    Intensity THRR unchanged      Progression    Progression Continue to progress workloads to maintain intensity without signs/symptoms of physical distress.  Resistance Training   Training Prescription Yes    Weight 3 lbs    Reps 10-15    Time 10 Minutes      NuStep   Level 2    SPM 81    Minutes 22    METs 1.9      Arm Ergometer   Level 2    RPM 56    Minutes 17    METs 2.7           Nutrition:  Target Goals: Understanding of nutrition guidelines, daily intake of sodium 1500mg , cholesterol 200mg , calories 30% from fat and 7% or less from saturated fats, daily to have 5 or more servings of fruits and vegetables.  Biometrics:  Pre Biometrics - 03/04/20 0825      Pre Biometrics   Weight 108.3 kg    BMI (Calculated) 38.55            Nutrition Therapy Plan and Nutrition Goals:  Nutrition Therapy & Goals - 03/10/20 0950      Personal Nutrition Goals   Comments We will continue to provide heart healthy nutritional education through handouts.      Intervention Plan   Intervention Nutrition handout(s) given to patient.           Nutrition Assessments:  Nutrition Assessments - 02/14/20 1302      MEDFICTS Scores   Pre Score 69          MEDIFICTS Score Key:  ?70 Need to make dietary changes   40-70 Heart Healthy Diet  ? 40 Therapeutic Level Cholesterol Diet   Picture Your Plate Scores:  <00 Unhealthy dietary pattern with much room for improvement.  41-50 Dietary pattern unlikely to meet recommendations for good health and room for improvement.  51-60 More healthful dietary pattern, with some room for improvement.   >60 Healthy dietary pattern, although there may be some specific behaviors that could be improved.    Nutrition Goals Re-Evaluation:   Nutrition Goals Discharge (Final Nutrition Goals Re-Evaluation):   Psychosocial: Target Goals: Acknowledge presence or absence of significant depression and/or stress, maximize coping skills, provide positive support system. Participant  is able to verbalize types and ability to use techniques and skills needed for reducing stress and depression.  Initial Review & Psychosocial Screening:  Initial Psych Review & Screening - 02/14/20 1254      Initial Review   Current issues with None Identified      Family Dynamics   Good Support System? Yes    Comments His wife is his support system. He talks with his children, grandchildren, and great grandchildren several times per week.      Barriers   Psychosocial barriers to participate in program There are no identifiable barriers or psychosocial needs.      Screening Interventions   Interventions Encouraged to exercise    Expected Outcomes Short Term goal: Utilizing psychosocial counselor, staff and physician to assist with identification of specific Stressors or current issues interfering with healing process. Setting desired goal for each stressor or current issue identified.;Long Term Goal: Stressors or current issues are controlled or eliminated.;Short Term goal: Identification and review with participant of any Quality of Life or Depression concerns found by scoring the questionnaire.;Long Term goal: The participant improves quality of Life and PHQ9 Scores as seen by post scores and/or verbalization of changes           Quality of Life Scores:  Quality of Life - 02/14/20 1414  Quality of Life   Select Quality of Life      Quality of Life Scores   Health/Function Pre 24 %    Socioeconomic Pre 25.64 %    Psych/Spiritual Pre 24 %    Family Pre 21.6 %    GLOBAL Pre 23.98 %          Scores of 19 and below usually indicate a poorer quality of life in these areas.  A difference of  2-3 points is a clinically meaningful difference.  A difference of 2-3 points in the total score of the Quality of Life Index has been associated with significant improvement in overall quality of life, self-image, physical symptoms, and general health in studies assessing change in quality  of life.  PHQ-9: Recent Review Flowsheet Data    Depression screen [facility] 2/9 02/14/2020 12/28/2019 10/01/2019 06/22/2019 06/07/2019   Decreased Interest 0 0 0 0 0   Down, Depressed, Hopeless 0 0 0 0 1   PHQ - 2 Score 0 0 0 0 1   Altered sleeping 0 - - - -   Tired, decreased energy 1 - - - -   Change in appetite 0 - - - -   Feeling bad or failure about yourself  0 - - - -   Trouble concentrating 0 - - - -   Moving slowly or fidgety/restless 0 - - - -   Suicidal thoughts 0 - - - -   PHQ-9 Score 1 - - - -   Difficult doing work/chores Not difficult at all - - - -     Interpretation of Total Score  Total Score Depression Severity:  1-4 = Minimal depression, 5-9 = Mild depression, 10-14 = Moderate depression, 15-19 = Moderately severe depression, 20-27 = Severe depression   Psychosocial Evaluation and Intervention:  Psychosocial Evaluation - 02/14/20 1306      Psychosocial Evaluation & Interventions   Interventions Encouraged to exercise with the program and follow exercise prescription    Comments Patient has no identifiable psychosocial issues at orientation.    Expected Outcomes Patient will continue to not have any identifiable psychosocial issues in the cardiac rehab program.    Continue Psychosocial Services  No Follow up required           Psychosocial Re-Evaluation:  Psychosocial Re-Evaluation    Summers Name 02/18/20 0853 03/10/20 0950           Psychosocial Re-Evaluation   Current issues with None Identified --      Comments Patient is new to the program starting today 02/18/20. He has been taking Paxil 20 mg qd since 2012. His initial QOL score was 23.98% overall and his PHQ-9 score was 1. He has no psychosocial issues identified. Will continue to monitor. Patient has completed 10 sessions. He continues to take Paxil daily and has no psychosocia issues identified. He demonstrates a positive outlook overall but does no always seem to enjoy exercising. He is very interactive with  others in his class and the staff.      Expected Outcomes Patient will have no psychosocial issues identifieda at discharge. Patient will have no psychosocial issues identifieda at discharge.      Interventions Stress management education;Encouraged to attend Cardiac Rehabilitation for the exercise;Relaxation education Stress management education;Encouraged to attend Cardiac Rehabilitation for the exercise;Relaxation education      Continue Psychosocial Services  No Follow up required No Follow up required  Psychosocial Discharge (Final Psychosocial Re-Evaluation):  Psychosocial Re-Evaluation - 03/10/20 0950      Psychosocial Re-Evaluation   Comments Patient has completed 10 sessions. He continues to take Paxil daily and has no psychosocia issues identified. He demonstrates a positive outlook overall but does no always seem to enjoy exercising. He is very interactive with others in his class and the staff.    Expected Outcomes Patient will have no psychosocial issues identifieda at discharge.    Interventions Stress management education;Encouraged to attend Cardiac Rehabilitation for the exercise;Relaxation education    Continue Psychosocial Services  No Follow up required           Vocational Rehabilitation: Provide vocational rehab assistance to qualifying candidates.   Vocational Rehab Evaluation & Intervention:  Vocational Rehab - 02/14/20 1254      Initial Vocational Rehab Evaluation & Intervention   Assessment shows need for Vocational Rehabilitation No      Vocational Rehab Re-Evaulation   Comments He is retired and is not interested in returning to work.           Education: Education Goals: Education classes will be provided on a weekly basis, covering required topics. Participant will state understanding/return demonstration of topics presented.  Learning Barriers/Preferences:  Learning Barriers/Preferences - 02/14/20 1255      Learning  Barriers/Preferences   Learning Barriers Hearing   cannot hear out of right ear   Learning Preferences Audio;Video;Computer/Internet;Group Instruction;Individual Instruction;Pictoral;Skilled Demonstration;Verbal Instruction;Written Material           Education Topics: Hypertension, Hypertension Reduction -Define heart disease and high blood pressure. Discus how high blood pressure affects the body and ways to reduce high blood pressure.   Exercise and Your Heart -Discuss why it is important to exercise, the FITT principles of exercise, normal and abnormal responses to exercise, and how to exercise safely.   Angina -Discuss definition of angina, causes of angina, treatment of angina, and how to decrease risk of having angina.   Cardiac Medications -Review what the following cardiac medications are used for, how they affect the body, and side effects that may occur when taking the medications.  Medications include Aspirin, Beta blockers, calcium channel blockers, ACE Inhibitors, angiotensin receptor blockers, diuretics, digoxin, and antihyperlipidemics.   Congestive Heart Failure -Discuss the definition of CHF, how to live with CHF, the signs and symptoms of CHF, and how keep track of weight and sodium intake.   Heart Disease and Intimacy -Discus the effect sexual activity has on the heart, how changes occur during intimacy as we age, and safety during sexual activity. Flowsheet Row CARDIAC REHAB PHASE II EXERCISE from 03/12/2020 in [place]  Date 02/20/20  Educator [facility]  Instruction Review Code 2- Demonstrated Understanding      Smoking Cessation / COPD -Discuss different methods to quit smoking, the health benefits of quitting smoking, and the definition of COPD. Flowsheet Row CARDIAC REHAB PHASE II EXERCISE from 03/12/2020 in [place]  Date 02/27/20  Educator DF  Instruction Review Code 2- Demonstrated Understanding       Nutrition I: Fats -Discuss the types of cholesterol, what cholesterol does to the heart, and how cholesterol levels can be controlled. Flowsheet Row CARDIAC REHAB PHASE II EXERCISE from 03/12/2020 in [place]  Date 03/05/20  Educator mk  Instruction Review Code 2- Demonstrated Understanding      Nutrition II: Labels -Discuss the different components of food labels and how to read food label [place]  REHAB PHASE II EXERCISE from 03/12/2020 in [place]  Date 03/12/20  Educator DJ  Instruction Review Code 1- Verbalizes Understanding      Heart Parts/Heart Disease and PAD -Discuss the anatomy of the heart, the pathway of blood circulation through the heart, and these are affected by heart disease.   Stress I: Signs and Symptoms -Discuss the causes of stress, how stress may lead to anxiety and depression, and ways to limit stress.   Stress II: Relaxation -Discuss different types of relaxation techniques to limit stress.   Warning Signs of Stroke / TIA -Discuss definition of a stroke, what the signs and symptoms are of a stroke, and how to identify when someone is having stroke.   Knowledge Questionnaire Score:  Knowledge Questionnaire Score - 02/14/20 1300      Knowledge Questionnaire Score   Pre Score 20/24           Core Components/Risk Factors/Patient Goals at Admission:  Personal Goals and Risk Factors at Admission - 02/14/20 1256      Core Components/Risk Factors/Patient Goals on Admission    Weight Management Yes;Weight Loss   He would like to lose 10 lbs while in the program   Intervention Weight Management: Develop a combined nutrition and exercise program designed to reach desired caloric intake, while maintaining appropriate intake of nutrient and fiber, sodium and fats, and appropriate energy expenditure required for the weight goal.;Weight Management: Provide education and appropriate resources to  help participant work on and attain dietary goals.;Weight Management/Obesity: Establish reasonable short term and long term weight goals.;Obesity: Provide education and appropriate resources to help participant work on and attain dietary goals.    Expected Outcomes Short Term: Continue to assess and modify interventions until short term weight is achieved;Long Term: Adherence to nutrition and physical activity/exercise program aimed toward attainment of established weight goal;Weight Maintenance: Understanding of the daily nutrition guidelines, which includes 25-35% calories from fat, 7% or less cal from saturated fats, less than 200mg  cholesterol, less than 1.5gm of sodium, & 5 or more servings of fruits and vegetables daily;Weight Loss: Understanding of general recommendations for a balanced deficit meal plan, which promotes 1-2 lb weight loss per week and includes a negative energy balance of [phone] kcal/d;Understanding recommendations for meals to include 15-35% energy as protein, 25-35% energy from fat, 35-60% energy from carbohydrates, less than 200mg  of dietary cholesterol, 20-35 gm of total fiber daily;Understanding of distribution of calorie intake throughout the day with the consumption of 4-5 meals/snacks;Weight Gain: Understanding of general recommendations for a high calorie, high protein meal plan that promotes weight gain by distributing calorie intake throughout the day with the consumption for 4-5 meals, snacks, and/or supplements    Improve shortness of breath with ADL's Yes    Intervention Provide education, individualized exercise plan and daily activity instruction to help decrease symptoms of SOB with activities of daily living.    Expected Outcomes Short Term: Improve cardiorespiratory fitness to achieve a reduction of symptoms when performing ADLs;Long Term: Be able to perform more ADLs without symptoms or delay the onset of symptoms    Diabetes Yes    Intervention Provide education  about signs/symptoms and action to take for hypo/hyperglycemia.;Provide education about proper nutrition, including hydration, and aerobic/resistive exercise prescription along with prescribed medications to achieve blood glucose in normal ranges: Fasting glucose 65-99 mg/dL    Expected Outcomes Short Term: Participant verbalizes understanding of the signs/symptoms and immediate care of hyper/hypoglycemia, proper foot care and importance of  medication, aerobic/resistive exercise and nutrition plan for blood glucose control.;Long Term: Attainment of HbA1C < 7%.    Hypertension Yes    Intervention Provide education on lifestyle modifcations including regular physical activity/exercise, weight management, moderate sodium restriction and increased consumption of fresh fruit, vegetables, and low fat dairy, alcohol moderation, and smoking cessation.;Monitor prescription use compliance.    Expected Outcomes Short Term: Continued assessment and intervention until BP is < 140/68mm HG in hypertensive participants. < 130/90mm HG in hypertensive participants with diabetes, heart failure or chronic kidney disease.;Long Term: Maintenance of blood pressure at goal levels.    Lipids Yes    Intervention Provide education and support for participant on nutrition & aerobic/resistive exercise along with prescribed medications to achieve LDL 70mg , HDL >40mg .    Expected Outcomes Short Term: Participant states understanding of desired cholesterol values and is compliant with medications prescribed. Participant is following exercise prescription and nutrition guidelines.;Long Term: Cholesterol controlled with medications as prescribed, with individualized exercise RX and with personalized nutrition plan. Value goals: LDL < 70mg , HDL > 40 mg.           Core Components/Risk Factors/Patient Goals Review:   Goals and Risk Factor Review    Row Name 02/18/20 0859 03/10/20 0953           Core Components/Risk Factors/Patient  Goals Review   Personal Goals Review Weight Management/Obesity;Diabetes Diabetes;Weight Management/Obesity;Hypertension      Review Patient is new to the program starting today 02/18/20. He has multiple risk factors for CAD and is participating in cardiac rehab for risk modification. His personal goals for the program are to lose 10 lbs in the program and more long term and to get stronger. Will continue to monitor as he works toward meeting these goals. Patient has completed 10 sessions losing 1 lb since his initial visit. He is doing well in the program with consistent attendance. His most recent A1C on file was 11/30/19 at 8.2% which has trended upward over the past 2 years. His blood pressure is hypertensive and will continue to monitor this and contact his MD if this trend continues. His personal goals for the program are to lose 10 lbs and get stronger. We will continue to monitor as he works toward meeting these goals.      Expected Outcomes Patient will complete the program meeting both personal and program goals. Patient will complete the program meeting both personal and program goals.             Core Components/Risk Factors/Patient Goals at Discharge (Final Review):   Goals and Risk Factor Review - 03/10/20 0953      Core Components/Risk Factors/Patient Goals Review   Personal Goals Review Diabetes;Weight Management/Obesity;Hypertension    Review Patient has completed 10 sessions losing 1 lb since his initial visit. He is doing well in the program with consistent attendance. His most recent A1C on file was 11/30/19 at 8.2% which has trended upward over the past 2 years. His blood pressure is hypertensive and will continue to monitor this and contact his MD if this trend continues. His personal goals for the program are to lose 10 lbs and get stronger. We will continue to monitor as he works toward meeting these goals.    Expected Outcomes Patient will complete the program meeting both  personal and program goals.           ITP Comments:   Comments: ITP REVIEW Pt is making expected progress toward Cardiac Rehab goals after completing  14 sessions. Recommend continued exercise, life style modification, education, and increased stamina and strength.

## 2020-03-21 ENCOUNTER — Encounter (HOSPITAL_COMMUNITY)
Admission: RE | Admit: 2020-03-21 | Discharge: 2020-03-21 | Disposition: A | Discharge status: Home / Self Care | Payer: Medicare Other | Source: Ambulatory Visit | Attending: Cardiology | Admitting: Cardiology

## 2020-03-21 ENCOUNTER — Other Ambulatory Visit: Payer: Self-pay

## 2020-03-21 DIAGNOSIS — Z955 Presence of coronary angioplasty implant and graft: Secondary | ICD-10-CM

## 2020-03-21 DIAGNOSIS — Z952 Presence of prosthetic heart valve: Secondary | ICD-10-CM | POA: Diagnosis not present

## 2020-03-21 NOTE — Progress Notes (Signed)
Daily Session Note  Patient Details  Name: [name] MRN: [id number] Date of Birth: [birth year] Referring Provider:   Flowsheet Row CARDIAC REHAB PHASE II ORIENTATION from 02/14/2020 in [place]  Referring Provider Domenic Polite      Encounter Date: 03/21/2020  Check In:  Session Check In - 03/21/20 1445      Check-In   Supervising physician immediately available to respond to emergencies [facility] MD immediately available    Physician(s) Dr. Domenic Polite    Location [facility]    Staff Present Geanie Cooley, RN;Debra Wynetta Emery, RN, BSN    Virtual Visit No    Medication changes reported     No    Fall or balance concerns reported    No    Tobacco Cessation No Change    Warm-up and Cool-down Performed as group-led instruction    Resistance Training Performed Yes    VAD Patient? No    PAD/SET Patient? No      Pain Assessment   Currently in Pain? No/denies    Multiple Pain Sites No           Capillary Blood Glucose: No results found for this or any previous visit (from the past 24 hour(s)).    Social History   Tobacco Use  Smoking Status Former Smoker  . Packs/day: 1.00  . Years: 20.00  . Pack years: 20.00  . Types: Cigarettes  . Start date: 01/18/1963  . Quit date: 08/11/1988  . Years since quitting: 31.6  Smokeless Tobacco Former Systems developer  . Types: Chew  . Quit date: 01/11/1989  Tobacco Comment   chewed 1 pack tobacco/day for 15 years    Goals Met:  Independence with exercise equipment Exercise tolerated well No report of cardiac concerns or symptoms Strength training completed today  Goals Unmet:  Not Applicable  Comments: check out 3:45   Dr. Kathie Dike is Medical Director for [facility].

## 2020-03-24 ENCOUNTER — Other Ambulatory Visit: Payer: Self-pay

## 2020-03-24 ENCOUNTER — Encounter (HOSPITAL_COMMUNITY)
Admission: RE | Admit: 2020-03-24 | Discharge: 2020-03-24 | Disposition: A | Discharge status: Home / Self Care | Payer: Medicare Other | Source: Ambulatory Visit | Attending: Cardiology | Admitting: Cardiology

## 2020-03-24 DIAGNOSIS — Z955 Presence of coronary angioplasty implant and graft: Secondary | ICD-10-CM

## 2020-03-24 DIAGNOSIS — Z952 Presence of prosthetic heart valve: Secondary | ICD-10-CM | POA: Diagnosis not present

## 2020-03-24 NOTE — Progress Notes (Signed)
I have reviewed a Home Exercise Prescription with [name] . [name] is not currently exercising at home.  The patient was advised to walk 2 days a week for 20-30 minutes outside of rehab.  [name] and I discussed how to progress their exercise prescription.  The patient stated that their goals were to lose weight and to improve ADL's.  The patient stated that they understand the exercise prescription.  We reviewed exercise guidelines, target heart rate during exercise, RPE Scale, weather conditions, NTG use, endpoints for exercise, warmup and cool down.  Patient is encouraged to come to me with any questions. I will continue to follow up with the patient to assist them with progression and safety.

## 2020-03-24 NOTE — Progress Notes (Signed)
Daily Session Note  Patient Details  Name: [name] MRN: [id number] Date of Birth: [birth year] Referring Provider:   Flowsheet Row CARDIAC REHAB PHASE II ORIENTATION from 02/14/2020 in [place]  Referring Provider Domenic Polite      Encounter Date: 03/24/2020  Check In:  Session Check In - 03/24/20 1445      Check-In   Supervising physician immediately available to respond to emergencies [facility] MD immediately available    Physician(s) Dr. Harrington Challenger    Location [facility]    Staff Present Cathren Harsh, MS, Exercise Physiologist;Dalton Kris Mouton, MS, ACSM-CEP, Exercise Physiologist    Virtual Visit No    Medication changes reported     No    Fall or balance concerns reported    No    Tobacco Cessation No Change    Warm-up and Cool-down Performed as group-led instruction    Resistance Training Performed Yes    VAD Patient? No    PAD/SET Patient? No      Pain Assessment   Currently in Pain? No/denies    Multiple Pain Sites No           Capillary Blood Glucose: No results found for this or any previous visit (from the past 24 hour(s)).    Social History   Tobacco Use  Smoking Status Former Smoker  . Packs/day: 1.00  . Years: 20.00  . Pack years: 20.00  . Types: Cigarettes  . Start date: 01/18/1963  . Quit date: 08/11/1988  . Years since quitting: 31.6  Smokeless Tobacco Former Systems developer  . Types: Chew  . Quit date: 01/11/1989  Tobacco Comment   chewed 1 pack tobacco/day for 15 years    Goals Met:  Independence with exercise equipment Exercise tolerated well No report of cardiac concerns or symptoms Strength training completed today  Goals Unmet:  Not Applicable  Comments: check out 1545   Dr. Kathie Dike is Medical Director for [facility] Pulmonary Re[facility].

## 2020-03-25 ENCOUNTER — Other Ambulatory Visit: Payer: Self-pay | Admitting: Family Medicine

## 2020-03-26 ENCOUNTER — Other Ambulatory Visit: Payer: Self-pay

## 2020-03-26 ENCOUNTER — Encounter (HOSPITAL_COMMUNITY)
Admission: RE | Admit: 2020-03-26 | Discharge: 2020-03-26 | Disposition: A | Discharge status: Home / Self Care | Payer: Medicare Other | Source: Ambulatory Visit | Attending: Cardiology | Admitting: Cardiology

## 2020-03-26 DIAGNOSIS — Z955 Presence of coronary angioplasty implant and graft: Secondary | ICD-10-CM

## 2020-03-26 DIAGNOSIS — Z952 Presence of prosthetic heart valve: Secondary | ICD-10-CM | POA: Diagnosis not present

## 2020-03-26 DIAGNOSIS — H02831 Dermatochalasis of right upper eyelid: Secondary | ICD-10-CM | POA: Diagnosis not present

## 2020-03-26 DIAGNOSIS — Z961 Presence of intraocular lens: Secondary | ICD-10-CM | POA: Diagnosis not present

## 2020-03-26 DIAGNOSIS — H02834 Dermatochalasis of left upper eyelid: Secondary | ICD-10-CM | POA: Diagnosis not present

## 2020-03-26 DIAGNOSIS — H02423 Myogenic ptosis of bilateral eyelids: Secondary | ICD-10-CM | POA: Diagnosis not present

## 2020-03-26 DIAGNOSIS — E119 Type 2 diabetes mellitus without complications: Secondary | ICD-10-CM | POA: Diagnosis not present

## 2020-03-26 DIAGNOSIS — Z794 Long term (current) use of insulin: Secondary | ICD-10-CM | POA: Diagnosis not present

## 2020-03-26 NOTE — Progress Notes (Signed)
Daily Session Note  Patient Details  Name: [name] MRN: [id number] Date of Birth: August 17, 1940 Referring Provider:   Flowsheet Row CARDIAC REHAB PHASE II ORIENTATION from 02/14/2020 in [place]  Referring Provider Domenic Polite      Encounter Date: 03/26/2020  Check In:  Session Check In - 03/26/20 1445      Check-In   Supervising physician immediately available to respond to emergencies [facility] MD immediately available    Physician(s) Dr. Harrington Challenger    Location [facility]    Staff Present Cathren Harsh, MS, Exercise Physiologist;Dalton Kris Mouton, MS, ACSM-CEP, Exercise Physiologist    Virtual Visit No    Medication changes reported     No    Fall or balance concerns reported    No    Tobacco Cessation No Change    Warm-up and Cool-down Performed as group-led instruction    Resistance Training Performed Yes    VAD Patient? No    PAD/SET Patient? No      Pain Assessment   Currently in Pain? No/denies    Multiple Pain Sites No           Capillary Blood Glucose: No results found for this or any previous visit (from the past 24 hour(s)).    Social History   Tobacco Use  Smoking Status Former Smoker  . Packs/day: 1.00  . Years: 20.00  . Pack years: 20.00  . Types: Cigarettes  . Start date: 01/18/1963  . Quit date: 08/11/1988  . Years since quitting: 31.6  Smokeless Tobacco Former Systems developer  . Types: Chew  . Quit date: 01/11/1989  Tobacco Comment   chewed 1 pack tobacco/day for 15 years    Goals Met:  Independence with exercise equipment Exercise tolerated well No report of cardiac concerns or symptoms Strength training completed today  Goals Unmet:  Not Applicable  Comments: check out 1545   Dr. Kathie Dike is Medical Director for [facility] Re[facility].

## 2020-03-28 ENCOUNTER — Other Ambulatory Visit: Payer: Self-pay

## 2020-03-28 ENCOUNTER — Encounter (HOSPITAL_COMMUNITY)
Admission: RE | Admit: 2020-03-28 | Discharge: 2020-03-28 | Disposition: A | Discharge status: Home / Self Care | Payer: Medicare Other | Source: Ambulatory Visit | Attending: Cardiology | Admitting: Cardiology

## 2020-03-28 DIAGNOSIS — Z952 Presence of prosthetic heart valve: Secondary | ICD-10-CM

## 2020-03-28 DIAGNOSIS — Z955 Presence of coronary angioplasty implant and graft: Secondary | ICD-10-CM

## 2020-03-28 NOTE — Progress Notes (Signed)
Daily Session Note  Patient Details  Name: [name] MRN: [id number] Date of Birth: 16-Mar-1940 Referring Provider:   Flowsheet Row CARDIAC REHAB PHASE II ORIENTATION from 02/14/2020 in [place]  Referring Provider Domenic Polite      Encounter Date: 03/28/2020  Check In:  Session Check In - 03/28/20 1445      Check-In   Supervising physician immediately available to respond to emergencies [facility] MD immediately available    Physician(s) Dr. Domenic Polite    Location [facility]    Staff Present Cathren Harsh, MS, Exercise Physiologist;Debra Wynetta Emery, RN, BSN    Virtual Visit No    Medication changes reported     No    Fall or balance concerns reported    No    Tobacco Cessation No Change    Warm-up and Cool-down Performed as group-led instruction    Resistance Training Performed Yes    VAD Patient? No    PAD/SET Patient? No      Pain Assessment   Currently in Pain? No/denies    Multiple Pain Sites No           Capillary Blood Glucose: No results found for this or any previous visit (from the past 24 hour(s)).    Social History   Tobacco Use  Smoking Status Former Smoker  . Packs/day: 1.00  . Years: 20.00  . Pack years: 20.00  . Types: Cigarettes  . Start date: 01/18/1963  . Quit date: 08/11/1988  . Years since quitting: 31.6  Smokeless Tobacco Former Systems developer  . Types: Chew  . Quit date: 01/11/1989  Tobacco Comment   chewed 1 pack tobacco/day for 15 years    Goals Met:  Independence with exercise equipment Exercise tolerated well No report of cardiac concerns or symptoms Strength training completed today  Goals Unmet:  Not Applicable  Comments: check out 1545   Dr. Kathie Dike is Medical Director for [facility] Re[facility].

## 2020-03-31 ENCOUNTER — Encounter (HOSPITAL_COMMUNITY)
Admission: RE | Admit: 2020-03-31 | Discharge: 2020-03-31 | Disposition: A | Discharge status: Home / Self Care | Payer: Medicare Other | Source: Ambulatory Visit | Attending: Cardiology | Admitting: Cardiology

## 2020-03-31 ENCOUNTER — Other Ambulatory Visit: Payer: Self-pay

## 2020-03-31 VITALS — Wt 239.9 lb

## 2020-03-31 DIAGNOSIS — Z952 Presence of prosthetic heart valve: Secondary | ICD-10-CM

## 2020-03-31 DIAGNOSIS — Z955 Presence of coronary angioplasty implant and graft: Secondary | ICD-10-CM

## 2020-03-31 NOTE — Progress Notes (Signed)
Daily Session Note  Patient Details  Name: [name] MRN: [id number] Date of Birth: May 22, 1940 Referring Provider:   Flowsheet Row CARDIAC REHAB PHASE II ORIENTATION from 02/14/2020 in [place]  Referring Provider Domenic Polite      Encounter Date: 03/31/2020  Check In:  Session Check In - 03/31/20 1445      Check-In   Supervising physician immediately available to respond to emergencies [facility] MD immediately available    Physician(s) Dr. Harl Bowie    Location [facility]    Staff Present Geanie Cooley, RN;Madison Audria Nine, MS, Exercise Physiologist    Virtual Visit No    Medication changes reported     No    Fall or balance concerns reported    No    Tobacco Cessation No Change    Warm-up and Cool-down Performed as group-led instruction    Resistance Training Performed Yes    VAD Patient? No    PAD/SET Patient? No      Pain Assessment   Currently in Pain? No/denies    Multiple Pain Sites No           Capillary Blood Glucose: No results found for this or any previous visit (from the past 24 hour(s)).    Social History   Tobacco Use  Smoking Status Former Smoker  . Packs/day: 1.00  . Years: 20.00  . Pack years: 20.00  . Types: Cigarettes  . Start date: 01/18/1963  . Quit date: 08/11/1988  . Years since quitting: 31.6  Smokeless Tobacco Former Systems developer  . Types: Chew  . Quit date: 01/11/1989  Tobacco Comment   chewed 1 pack tobacco/day for 15 years    Goals Met:  Independence with exercise equipment Exercise tolerated well No report of cardiac concerns or symptoms Strength training completed today  Goals Unmet:  Not Applicable  Comments: check out @ 3:45   Dr. Kathie Dike is Medical Director for [facility] Pul[facility] Re[facility].

## 2020-04-02 ENCOUNTER — Other Ambulatory Visit: Payer: Self-pay

## 2020-04-02 ENCOUNTER — Encounter (HOSPITAL_COMMUNITY)
Admission: RE | Admit: 2020-04-02 | Discharge: 2020-04-02 | Disposition: A | Discharge status: Home / Self Care | Payer: Medicare Other | Source: Ambulatory Visit | Attending: Cardiology | Admitting: Cardiology

## 2020-04-02 ENCOUNTER — Ambulatory Visit (INDEPENDENT_AMBULATORY_CARE_PROVIDER_SITE_OTHER): Payer: Medicare Other | Admitting: Family Medicine

## 2020-04-02 ENCOUNTER — Encounter: Payer: Self-pay | Admitting: Family Medicine

## 2020-04-02 VITALS — BP 156/64 | HR 60 | Ht 66.0 in | Wt 240.0 lb

## 2020-04-02 DIAGNOSIS — E785 Hyperlipidemia, unspecified: Secondary | ICD-10-CM

## 2020-04-02 DIAGNOSIS — R809 Proteinuria, unspecified: Secondary | ICD-10-CM

## 2020-04-02 DIAGNOSIS — Z955 Presence of coronary angioplasty implant and graft: Secondary | ICD-10-CM | POA: Diagnosis not present

## 2020-04-02 DIAGNOSIS — I152 Hypertension secondary to endocrine disorders: Secondary | ICD-10-CM | POA: Diagnosis not present

## 2020-04-02 DIAGNOSIS — E119 Type 2 diabetes mellitus without complications: Secondary | ICD-10-CM | POA: Diagnosis not present

## 2020-04-02 DIAGNOSIS — E1159 Type 2 diabetes mellitus with other circulatory complications: Secondary | ICD-10-CM

## 2020-04-02 DIAGNOSIS — E1169 Type 2 diabetes mellitus with other specified complication: Secondary | ICD-10-CM | POA: Diagnosis not present

## 2020-04-02 DIAGNOSIS — Z794 Long term (current) use of insulin: Secondary | ICD-10-CM

## 2020-04-02 DIAGNOSIS — E1129 Type 2 diabetes mellitus with other diabetic kidney complication: Secondary | ICD-10-CM

## 2020-04-02 DIAGNOSIS — Z952 Presence of prosthetic heart valve: Secondary | ICD-10-CM | POA: Diagnosis not present

## 2020-04-02 LAB — CBC WITH DIFFERENTIAL/PLATELET
Basophils Absolute: 0.1 10*3/uL (ref 0.0–0.2)
Basos: 1 %
EOS (ABSOLUTE): 0.2 10*3/uL (ref 0.0–0.4)
Eos: 3 %
Hematocrit: 39.8 % (ref 37.5–51.0)
Hemoglobin: 12.7 g/dL — ABNORMAL LOW (ref 13.0–17.7)
Immature Grans (Abs): 0 10*3/uL (ref 0.0–0.1)
Immature Granulocytes: 0 %
Lymphocytes Absolute: 1.7 10*3/uL (ref 0.7–3.1)
Lymphs: 28 %
MCH: 26.8 pg (ref 26.6–33.0)
MCHC: 31.9 g/dL (ref 31.5–35.7)
MCV: 84 fL (ref 79–97)
Monocytes Absolute: 0.5 10*3/uL (ref 0.1–0.9)
Monocytes: 9 %
Neutrophils Absolute: 3.6 10*3/uL (ref 1.4–7.0)
Neutrophils: 59 %
Platelets: 272 10*3/uL (ref 150–450)
RBC: 4.73 x10E6/uL (ref 4.14–5.80)
RDW: 14.8 % (ref 11.6–15.4)
WBC: 6.1 10*3/uL (ref 3.4–10.8)

## 2020-04-02 LAB — BAYER DCA HB A1C WAIVED: HB A1C (BAYER DCA - WAIVED): 7.1 % — ABNORMAL HIGH (ref <7.0)

## 2020-04-02 MED ORDER — TAMSULOSIN HCL 0.4 MG PO CAPS: 0.4000 mg | ORAL_CAPSULE | Freq: Every day | ORAL | 3 refills | Status: DC

## 2020-04-02 MED ORDER — DONEPEZIL HCL 5 MG PO TABS: 5.0000 mg | ORAL_TABLET | Freq: Every day | ORAL | 3 refills | Status: DC

## 2020-04-02 MED ORDER — PAROXETINE HCL 20 MG PO TABS: 20.0000 mg | ORAL_TABLET | Freq: Every day | ORAL | 3 refills | Status: DC

## 2020-04-02 MED ORDER — LANTUS SOLOSTAR 100 UNIT/ML ~~LOC~~ SOPN: PEN_INJECTOR | SUBCUTANEOUS | 3 refills | Status: DC

## 2020-04-02 MED ORDER — METFORMIN HCL ER 500 MG PO TB24: 1000.0000 mg | ORAL_TABLET | Freq: Two times a day (BID) | ORAL | 3 refills | Status: DC

## 2020-04-02 MED ORDER — FAMOTIDINE 20 MG PO TABS: 20.0000 mg | ORAL_TABLET | Freq: Two times a day (BID) | ORAL | 3 refills | Status: DC

## 2020-04-02 NOTE — Progress Notes (Signed)
BP (!) 156/64   Pulse 60   Ht _0  (1.676 m)   Wt 240 lb (108.9 kg)   SpO2 95%   BMI 38.74 kg/m    Subjective:   Patient ID: [name], male    DOB: 11-19-1940, 80 y.o.   MRN: [id number]  HPI: [name] is a 80 y.o. male presenting on 04/02/2020 for Medical Management of Chronic Issues, Diabetes, Hyperlipidemia, Hypertension, and Back Pain   HPI Type 2 diabetes mellitus Patient comes in today for recheck of his diabetes. Patient has been currently taking Lantus 56 daily and Humalog 15-20 3 times daily with meals.  Metformin 500.Marland Kitchen Patient is not currently on an ACE inhibitor/ARB. Patient has not seen an ophthalmologist this year. Patient denies any issues with their feet. The symptom started onset as an adult microalbuminuria and hypertension and hyperlipidemia ARE RELATED TO DM.  Patient not on an ACE inhibitor because of lower diastolic blood pressures and dizziness in the past.  Hypertension Patient is currently on Imdur and amlodipine, and their blood pressure today is 156/64. Patient denies any lightheadedness or dizziness. Patient denies headaches, blurred vision, chest pains, shortness of breath, or weakness. Denies any side effects from medication and is content with current medication.   Hyperlipidemia Patient is coming in for recheck of his hyperlipidemia. The patient is currently taking fish oil and atorvastatin. They deny any issues with myalgias or history of liver damage from it. They deny any focal numbness or weakness or chest pain.   Patient denies chronic back pain, has had injections from Dr. Loretha Brasil office previously.  He is talking about going to get 1 again, they did work the first time but is coming back.  He is doing rehab to get his strength back up as well.  Relevant past medical, surgical, family and social history reviewed and updated as indicated. Interim medical history since our last visit reviewed. Allergies and medications reviewed and  updated.  Review of Systems  Constitutional: Negative for chills and fever.  Eyes: Negative for visual disturbance.  Respiratory: Negative for shortness of breath and wheezing.   Cardiovascular: Negative for chest pain and leg swelling.  Musculoskeletal: Positive for back pain.  Skin: Negative for rash.  All other systems reviewed and are negative.   Per HPI unless specifically indicated above   Allergies as of 04/02/2020      Reactions   Ace Inhibitors Hives, Swelling, Rash   Rash,hives,tongue swelling   Angiotensin Receptor Blockers    Unknown reaction    Ciprofloxacin Other (See Comments)   confusion   Oxycodone-acetaminophen Other (See Comments)   Unknown reaction   Robaxin [methocarbamol] Other (See Comments)   Confusion    Toradol [ketorolac Tromethamine] Other (See Comments)   confusion   Valium Other (See Comments)   Hallucinations; confusion   Doxycycline Hives, Rash      Medication List       Accurate as of April 02, 2020  9:25 AM. If you have any questions, ask your nurse or doctor.        STOP taking these medications   mupirocin ointment 2 % Commonly known as: BACTROBAN Stopped by: Fransisca Kaufmann Catalaya Garr, MD     TAKE these medications   acetaminophen 650 MG CR tablet Commonly known as: TYLENOL Take 1,300 mg by mouth 2 (two) times daily.   ACETAMINOPHEN-BUTALBITAL 50-325 MG Tabs Take by mouth.   amLODipine 10 MG tablet Commonly known as: NORVASC Take 1 tablet (10  mg total) by mouth daily.   aspirin EC 81 MG tablet Take 81 mg by mouth at bedtime.   atorvastatin 40 MG tablet Commonly known as: LIPITOR Take 1 tablet (40 mg total) by mouth every evening.   butalbital-acetaminophen-caffeine 50-325-40 MG tablet Commonly known as: FIORICET Take 1 tablet by mouth 2 (two) times daily as needed for headache.   cetirizine 10 MG tablet Commonly known as: ZYRTEC Take 10 mg by mouth daily with supper.   clopidogrel 75 MG tablet Commonly known as:  PLAVIX Take 1 tablet (75 mg total) by mouth daily. Stop taking on 06/02/20.   Dexcom G6 Receiver Devi USE TO CHECK BLOOD SUGAR UP TO 4 TIMES DAILY AS DIRECTED. 1 DEVICE PER YEAR. DX: E11.9   Dexcom G6 Sensor Misc USE TO CHECK BLOOD SUGAR UP TO 4 TIMES DAILY AS DIRECTED. CHANGE SENSOR EVERY 30 DAYS. DX: E11.9   Dexcom G6 Transmitter Misc USE TO CHECK BLOOD SUGAR UP TO 4 TIMES DAILY AS DIRECTED. CHANGE TRANSMITTER EVERY 3 MONTHS. DX: E11.9   docusate sodium 100 MG capsule Commonly known as: COLACE Take 100 mg by mouth daily as needed for mild constipation or moderate constipation.   donepezil 5 MG tablet Commonly known as: ARICEPT Take 1 tablet (5 mg total) by mouth at bedtime.   famotidine 20 MG tablet Commonly known as: PEPCID Take 1 tablet (20 mg total) by mouth 2 (two) times daily with a meal.   Fish Oil 1200 MG Caps Take 1,200-2,400 mg by mouth See admin instructions. Tale 1200 mg in the morning and 2400 mg in the evening   furosemide 20 MG tablet Commonly known as: LASIX TAKE 1 TABLET BY MOUTH  DAILY AS NEEDED   insulin lispro 100 UNIT/ML injection Commonly known as: HUMALOG Inject 0-20 Units into the skin 3 (three) times daily as needed for high blood sugar.   isosorbide mononitrate 30 MG 24 hr tablet Commonly known as: IMDUR TAKE 1 TABLET BY MOUTH IN  THE EVENING   Lantus SoloStar 100 UNIT/ML Solostar Pen Generic drug: insulin glargine INJECT SUBCUTANEOUSLY 52  UNITS AT BEDTIME What changed: See the new instructions. Changed by: Fransisca Kaufmann Josilyn Shippee, MD   metFORMIN 500 MG 24 hr tablet Commonly known as: GLUCOPHAGE-XR Take 2 tablets (1,000 mg total) by mouth 2 (two) times daily.   nitroGLYCERIN 0.4 MG SL tablet Commonly known as: NITROSTAT DISSOLVE ONE TABLET UNDER THE TONGUE EVERY 5 MINUTES AS NEEDED FOR CHEST PAIN.  DO NOT EXCEED A TOTAL OF 3 DOSES IN 15 MINUTES   OneTouch Ultra test strip Generic drug: glucose blood Check BS TID Dx E11.59   PARoxetine 20  MG tablet Commonly known as: PAXIL Take 1 tablet (20 mg total) by mouth daily.   tamsulosin 0.4 MG Caps capsule Commonly known as: FLOMAX Take 1 capsule (0.4 mg total) by mouth daily.   Turmeric Curcumin 500 MG Caps Take 500 mg by mouth daily.   vitamin B-12 1000 MCG tablet Commonly known as: CYANOCOBALAMIN Take 2,000 mcg by mouth daily.   Vitamin D3 50 MCG (2000 UT) capsule Take 4,000 Units by mouth daily.        Objective:   BP (!) 156/64   Pulse 60   Ht _0  (1.676 m)   Wt 240 lb (108.9 kg)   SpO2 95%   BMI 38.74 kg/m   Wt Readings from Last 3 Encounters:  04/02/20 240 lb (108.9 kg)  03/31/20 239 lb 13.8 oz (108.8 kg)  03/04/20 238  lb 12.1 oz (108.3 kg)    Physical Exam Vitals and nursing note reviewed.  Constitutional:      General: He is not in acute distress.    Appearance: He is well-developed. He is not diaphoretic.  Eyes:     General: No scleral icterus.    Conjunctiva/sclera: Conjunctivae normal.  Neck:     Thyroid: No thyromegaly.  Cardiovascular:     Rate and Rhythm: Normal rate and regular rhythm.     Heart sounds: Normal heart sounds. No murmur heard.   Pulmonary:     Effort: Pulmonary effort is normal. No respiratory distress.     Breath sounds: Normal breath sounds. No wheezing.  Musculoskeletal:     Cervical back: Neck supple.  Lymphadenopathy:     Cervical: No cervical adenopathy.  Skin:    General: Skin is warm and dry.     Findings: No rash.  Neurological:     Mental Status: He is alert and oriented to person, place, and time.     Coordination: Coordination normal.  Psychiatric:        Behavior: Behavior normal.       Assessment & Plan:   Problem List Items Addressed This Visit      Cardiovascular and Mediastinum   Hypertension associated with diabetes ([place])   Relevant Medications   insulin glargine (LANTUS SOLOSTAR) 100 UNIT/ML Solostar Pen   metFORMIN (GLUCOPHAGE-XR) 500 MG 24 hr tablet   Other Relevant Orders    CBC with Differential/Platelet   CMP14+EGFR   Lipid panel   Bayer DCA Hb A1c Waived     Endocrine   Hyperlipidemia associated with type 2 diabetes mellitus (HCC)   Relevant Medications   insulin glargine (LANTUS SOLOSTAR) 100 UNIT/ML Solostar Pen   metFORMIN (GLUCOPHAGE-XR) 500 MG 24 hr tablet   Other Relevant Orders   CBC with Differential/Platelet   CMP14+EGFR   Lipid panel   Bayer DCA Hb A1c Waived   Type 2 diabetes mellitus with circulatory disorder (HCC)   Relevant Medications   insulin glargine (LANTUS SOLOSTAR) 100 UNIT/ML Solostar Pen   metFORMIN (GLUCOPHAGE-XR) 500 MG 24 hr tablet   Microalbuminuria due to type 2 diabetes mellitus (HCC)   Relevant Medications   insulin glargine (LANTUS SOLOSTAR) 100 UNIT/ML Solostar Pen   metFORMIN (GLUCOPHAGE-XR) 500 MG 24 hr tablet    Other Visit Diagnoses    Type 2 diabetes mellitus without complication, without long-term current use of insulin (HCC)    -  Primary   Relevant Medications   insulin glargine (LANTUS SOLOSTAR) 100 UNIT/ML Solostar Pen   metFORMIN (GLUCOPHAGE-XR) 500 MG 24 hr tablet   Other Relevant Orders   CBC with Differential/Platelet   CMP14+EGFR   Lipid panel   Bayer DCA Hb A1c Waived      Continue current medication, A1c 7.1.  He is having back pain and recommended to go back to Dr. Nelva Bush who did his injections previously and continue with the rehab that he is doing currently.  Patient has microalbuminuria but has not tolerated being on the ACE inhibitor because of low blood pressures.  He is on 2 heart blood pressure medications but could not add an ACE inhibitor to that.   Follow up plan: Return in about 3 months (around 07/03/2020), or if symptoms worsen or fail to improve, for Diabetes and hypertension and cholesterol recheck.  Counseling provided for all of the vaccine components Orders Placed This Encounter  Procedures  . CBC with Differential/Platelet  . CMP14+EGFR  .  Lipid panel  . Bayer [facility] Hb  A1c [place], MD [facility] 04/02/2020, 9:25 AM

## 2020-04-02 NOTE — Progress Notes (Signed)
Daily Session Note  Patient Details  Name: [name] MRN: [id number] Date of Birth: Jun 11, 1940 Referring Provider:   Flowsheet Row CARDIAC REHAB PHASE II ORIENTATION from 02/14/2020 in [facility]  Referring Provider Domenic Polite      Encounter Date: 04/02/2020  Check In:  Session Check In - 04/02/20 1445      Check-In   Supervising physician immediately available to respond to emergencies [facility] MD immediately available    Physician(s) Dr. Harl Bowie    Location [facility]    Staff Present Cathren Harsh, MS, Exercise Physiologist;Debra Wynetta Emery, RN, BSN    Virtual Visit No    Medication changes reported     No    Fall or balance concerns reported    No    Tobacco Cessation No Change    Warm-up and Cool-down Performed as group-led instruction    Resistance Training Performed Yes    VAD Patient? No    PAD/SET Patient? No      Pain Assessment   Currently in Pain? No/denies    Multiple Pain Sites No           Capillary Blood Glucose: Results for orders placed or performed in visit on 04/02/20 (from the past 24 hour(s))  Bayer DCA Hb A1c Waived     Status: Abnormal   Collection Time: 04/02/20  8:33 AM  Result Value Ref Range   HB A1C (BAYER DCA - WAIVED) 7.1 (H) <7.0 %   Narrative   Performed at:  [facility] [address] [place], [place]  [id number] Lab Director: Colletta [facility], Phone:  [phone]      Social History   Tobacco Use  Smoking Status Former Smoker  . Packs/day: 1.00  . Years: 20.00  . Pack years: 20.00  . Types: Cigarettes  . Start date: 01/18/1963  . Quit date: 08/11/1988  . Years since quitting: 31.6  Smokeless Tobacco Former Systems developer  . Types: Chew  . Quit date: 01/11/1989  Tobacco Comment   chewed 1 pack tobacco/day for 15 years    Goals Met:  Independence with exercise equipment Achieving weight loss Queuing for purse lip breathing No report of cardiac concerns or symptoms  Goals  Unmet:  Not Applicable  Comments: check out 1545   Dr. Kathie Dike is Medical Director for [facility].

## 2020-04-03 ENCOUNTER — Telehealth: Payer: Self-pay | Admitting: *Deleted

## 2020-04-03 LAB — CMP14+EGFR
ALT: 19 IU/L (ref 0–44)
AST: 18 IU/L (ref 0–40)
Albumin/Globulin Ratio: 1.2 (ref 1.2–2.2)
Albumin: 3.7 g/dL (ref 3.7–4.7)
Alkaline Phosphatase: 78 IU/L (ref 44–121)
BUN/Creatinine Ratio: 16 (ref 10–24)
BUN: 16 mg/dL (ref 8–27)
Bilirubin Total: 0.4 mg/dL (ref 0.0–1.2)
CO2: 20 mmol/L (ref 20–29)
Calcium: 8.8 mg/dL (ref 8.6–10.2)
Chloride: 103 mmol/L (ref 96–106)
Creatinine, Ser: 0.99 mg/dL (ref 0.76–1.27)
Globulin, Total: 3.1 g/dL (ref 1.5–4.5)
Glucose: 179 mg/dL — ABNORMAL HIGH (ref 65–99)
Potassium: 3.9 mmol/L (ref 3.5–5.2)
Sodium: 139 mmol/L (ref 134–144)
Total Protein: 6.8 g/dL (ref 6.0–8.5)
eGFR: 77 mL/min/{1.73_m2} (ref >59)

## 2020-04-03 LAB — LIPID PANEL
Chol/HDL Ratio: 3.2 ratio (ref 0.0–5.0)
Cholesterol, Total: 118 mg/dL (ref 100–199)
HDL: 37 mg/dL — ABNORMAL LOW (ref >39)
LDL Chol Calc (NIH): 61 mg/dL (ref 0–99)
Triglycerides: 107 mg/dL (ref 0–149)
VLDL Cholesterol Cal: 20 mg/dL (ref 5–40)

## 2020-04-03 MED ORDER — LANTUS SOLOSTAR 100 UNIT/ML ~~LOC~~ SOPN: PEN_INJECTOR | SUBCUTANEOUS | 3 refills | Status: DC

## 2020-04-03 NOTE — Telephone Encounter (Signed)
Fax from [facility], clarification on directions for Lantus pens Verbally clarified w/ Dr. Warrick Parisian pt is taking 56 units, script corrected

## 2020-04-04 ENCOUNTER — Other Ambulatory Visit: Payer: Self-pay

## 2020-04-04 ENCOUNTER — Encounter (HOSPITAL_COMMUNITY)
Admission: RE | Admit: 2020-04-04 | Discharge: 2020-04-04 | Disposition: A | Discharge status: Home / Self Care | Payer: Medicare Other | Source: Ambulatory Visit | Attending: Cardiology | Admitting: Cardiology

## 2020-04-04 DIAGNOSIS — Z955 Presence of coronary angioplasty implant and graft: Secondary | ICD-10-CM | POA: Diagnosis not present

## 2020-04-04 DIAGNOSIS — Z952 Presence of prosthetic heart valve: Secondary | ICD-10-CM

## 2020-04-04 NOTE — Progress Notes (Signed)
Daily Session Note  Patient Details  Name: [name] MRN: [id number] Date of Birth: 1940/12/06 Referring Provider:   Flowsheet Row CARDIAC REHAB PHASE II ORIENTATION from 02/14/2020 in [facility]  Referring Provider Domenic Polite      Encounter Date: 04/04/2020  Check In:  Session Check In - 04/04/20 1445      Check-In   Supervising physician immediately available to respond to emergencies [facility] MD immediately available    Physician(s) Dr. Harrington Challenger    Location [facility]    Staff Present Cathren Harsh, MS, Exercise Physiologist;Debra Wynetta Emery, RN, BSN    Virtual Visit No    Medication changes reported     No    Fall or balance concerns reported    No    Tobacco Cessation No Change    Warm-up and Cool-down Performed as group-led instruction    Resistance Training Performed Yes    VAD Patient? No    PAD/SET Patient? No      Pain Assessment   Currently in Pain? No/denies    Multiple Pain Sites No           Capillary Blood Glucose: No results found for this or any previous visit (from the past 24 hour(s)).    Social History   Tobacco Use  Smoking Status Former Smoker  . Packs/day: 1.00  . Years: 20.00  . Pack years: 20.00  . Types: Cigarettes  . Start date: 01/18/1963  . Quit date: 08/11/1988  . Years since quitting: 31.6  Smokeless Tobacco Former Systems developer  . Types: Chew  . Quit date: 01/11/1989  Tobacco Comment   chewed 1 pack tobacco/day for 15 years    Goals Met:  Independence with exercise equipment Exercise tolerated well No report of cardiac concerns or symptoms Strength training completed today  Goals Unmet:  Not Applicable  Comments: check out 1545   Dr. Kathie Dike is Medical Director for [facility] Pulmonary Rehab.

## 2020-04-07 ENCOUNTER — Encounter (HOSPITAL_COMMUNITY)
Admission: RE | Admit: 2020-04-07 | Discharge: 2020-04-07 | Disposition: A | Discharge status: Home / Self Care | Payer: Medicare Other | Source: Ambulatory Visit | Attending: Cardiology | Admitting: Cardiology

## 2020-04-07 ENCOUNTER — Other Ambulatory Visit: Payer: Self-pay

## 2020-04-07 DIAGNOSIS — Z952 Presence of prosthetic heart valve: Secondary | ICD-10-CM | POA: Diagnosis not present

## 2020-04-07 DIAGNOSIS — Z955 Presence of coronary angioplasty implant and graft: Secondary | ICD-10-CM

## 2020-04-07 NOTE — Progress Notes (Signed)
Daily Session Note  Patient Details  Name: [name] MRN: [id number] Date of Birth: [birth year] Referring Provider:   Flowsheet Row CARDIAC REHAB PHASE II ORIENTATION from 02/14/2020 in [facility]  Referring Provider Domenic Polite      Encounter Date: 04/07/2020  Check In:  Session Check In - 04/07/20 1445      Check-In   Supervising physician immediately available to respond to emergencies [facility] MD immediately available    Physician(s) Dr. Domenic Polite    Location [facility]    Staff Present Cathren Harsh, MS, Exercise Physiologist;Dalton Kris Mouton, MS, ACSM-CEP, Exercise Physiologist    Virtual Visit No    Medication changes reported     No    Fall or balance concerns reported    No    Tobacco Cessation No Change    Warm-up and Cool-down Performed as group-led instruction    Resistance Training Performed Yes    VAD Patient? No    PAD/SET Patient? No      Pain Assessment   Currently in Pain? No/denies    Multiple Pain Sites No           Capillary Blood Glucose: No results found for this or any previous visit (from the past 24 hour(s)).    Social History   Tobacco Use  Smoking Status Former Smoker  . Packs/day: 1.00  . Years: 20.00  . Pack years: 20.00  . Types: Cigarettes  . Start date: 01/18/1963  . Quit date: 08/11/1988  . Years since quitting: 31.6  Smokeless Tobacco Former Systems developer  . Types: Chew  . Quit date: 01/11/1989  Tobacco Comment   chewed 1 pack tobacco/day for 15 years    Goals Met:  Independence with exercise equipment Exercise tolerated well No report of cardiac concerns or symptoms Strength training completed today  Goals Unmet:  Not Applicable  Comments: check out 1545   Dr. Kathie Dike is Medical Director for [facility] Pul[facility] Re[facility].

## 2020-04-09 ENCOUNTER — Encounter (HOSPITAL_COMMUNITY)
Admission: RE | Admit: 2020-04-09 | Discharge: 2020-04-09 | Disposition: A | Discharge status: Home / Self Care | Payer: Medicare Other | Source: Ambulatory Visit | Attending: Cardiology | Admitting: Cardiology

## 2020-04-09 DIAGNOSIS — Z952 Presence of prosthetic heart valve: Secondary | ICD-10-CM | POA: Diagnosis not present

## 2020-04-09 DIAGNOSIS — Z955 Presence of coronary angioplasty implant and graft: Secondary | ICD-10-CM

## 2020-04-09 NOTE — Progress Notes (Signed)
Daily Session Note  Patient Details  Name: [name] MRN: [id number] Date of Birth: 05/03/1940 Referring Provider:   Flowsheet Row CARDIAC REHAB PHASE II ORIENTATION from 02/14/2020 in [facility]  Referring Provider [place]      Encounter Date: 04/09/2020  Check In:  Session Check In - 04/09/20 1445      Check-In   Supervising physician immediately available to respond to emergencies [facility] MD immediately available    Physician(s) Dr. McDowell    Location [facility]    Staff Present  , MS, Exercise Physiologist;Dalton Fletcher, MS, ACSM-CEP, Exercise Physiologist    Virtual Visit No    Medication changes reported     No    Fall or balance concerns reported    No    Tobacco Cessation No Change    Warm-up and Cool-down Performed as group-led instruction    Resistance Training Performed Yes    VAD Patient? No    PAD/SET Patient? No      Pain Assessment   Currently in Pain? No/denies    Multiple Pain Sites No           Capillary Blood Glucose: No results found for this or any previous visit (from the past 24 hour(s)).    Social History   Tobacco Use  Smoking Status Former Smoker  . Packs/day: 1.00  . Years: 20.00  . Pack years: 20.00  . Types: Cigarettes  . Start date: 01/18/1963  . Quit date: 08/11/1988  . Years since quitting: 31.6  Smokeless Tobacco Former User  . Types: Chew  . Quit date: 01/11/1989  Tobacco Comment   chewed 1 pack tobacco/day for 15 years    Goals Met:  Independence with exercise equipment Exercise tolerated well No report of cardiac concerns or symptoms Strength training completed today  Goals Unmet:  Not Applicable  Comments: check out 1545    Dr. Jehanzeb Memon is Medical Director for [facility]. 

## 2020-04-10 ENCOUNTER — Other Ambulatory Visit: Payer: Self-pay

## 2020-04-11 ENCOUNTER — Other Ambulatory Visit: Payer: Self-pay

## 2020-04-11 ENCOUNTER — Encounter (HOSPITAL_COMMUNITY)
Admission: RE | Admit: 2020-04-11 | Discharge: 2020-04-11 | Disposition: A | Discharge status: Home / Self Care | Payer: Medicare Other | Source: Ambulatory Visit | Attending: Cardiology | Admitting: Cardiology

## 2020-04-11 DIAGNOSIS — Z955 Presence of coronary angioplasty implant and graft: Secondary | ICD-10-CM | POA: Diagnosis not present

## 2020-04-11 DIAGNOSIS — Z952 Presence of prosthetic heart valve: Secondary | ICD-10-CM | POA: Diagnosis not present

## 2020-04-11 NOTE — Progress Notes (Signed)
Daily Session Note  Patient Details  Name: [name] MRN: [id number] Date of Birth: August 05, 1940 Referring Provider:   Flowsheet Row CARDIAC REHAB PHASE II ORIENTATION from 02/14/2020 in [facility]  Referring Provider Domenic Polite      Encounter Date: 04/11/2020  Check In:  Session Check In - 04/11/20 1445      Check-In   Supervising physician immediately available to respond to emergencies [facility] MD immediately available    Physician(s) Dr. Harl Bowie    Location [facility]    Staff Present Aundra Dubin, RN, BSN;Trayquan Kolakowski Audria Nine, MS, Exercise Physiologist    Virtual Visit No    Medication changes reported     No    Fall or balance concerns reported    No    Tobacco Cessation No Change    Warm-up and Cool-down Performed as group-led instruction    Resistance Training Performed Yes    VAD Patient? No    PAD/SET Patient? No      Pain Assessment   Currently in Pain? No/denies    Multiple Pain Sites No           Capillary Blood Glucose: No results found for this or any previous visit (from the past 24 hour(s)).    Social History   Tobacco Use  Smoking Status Former Smoker  . Packs/day: 1.00  . Years: 20.00  . Pack years: 20.00  . Types: Cigarettes  . Start date: 01/18/1963  . Quit date: 08/11/1988  . Years since quitting: 31.6  Smokeless Tobacco Former Systems developer  . Types: Chew  . Quit date: 01/11/1989  Tobacco Comment   chewed 1 pack tobacco/day for 15 years    Goals Met:  Independence with exercise equipment Exercise tolerated well No report of cardiac concerns or symptoms Strength training completed today  Goals Unmet:  Not Applicable  Comments: check out 1545   Dr. Kathie Dike is Medical Director for [facility] Pulmonary Re[facility].

## 2020-04-14 ENCOUNTER — Encounter (HOSPITAL_COMMUNITY): Payer: Medicare Other

## 2020-04-16 ENCOUNTER — Encounter (HOSPITAL_COMMUNITY): Payer: Medicare Other

## 2020-04-16 NOTE — Progress Notes (Signed)
Cardiac Individual Treatment Plan  Patient Details  Name: [name] MRN: [id number] Date of Birth: 09/04/1940 Referring Provider:   Flowsheet Row CARDIAC REHAB PHASE II ORIENTATION from 02/14/2020 in [facility]  Referring Provider Domenic Polite      Initial Encounter Date:  [place] PHASE II ORIENTATION from 02/14/2020 in [facility]  Date 02/14/20      Visit Diagnosis: Status post coronary artery stent placement  S/P TAVR (transcatheter aortic valve replacement)  Patient's Home Medications on Admission:  Current Outpatient Medications:  .  acetaminophen (TYLENOL) 650 MG CR tablet, Take 1,300 mg by mouth 2 (two) times daily., Disp: , Rfl:  .  ACETAMINOPHEN-BUTALBITAL 50-325 MG TABS, Take by mouth., Disp: , Rfl:  .  amLODipine (NORVASC) 10 MG tablet, Take 1 tablet (10 mg total) by mouth daily., Disp: 90 tablet, Rfl: 1 .  aspirin EC 81 MG tablet, Take 81 mg by mouth at bedtime. , Disp: , Rfl:  .  atorvastatin (LIPITOR) 40 MG tablet, Take 1 tablet (40 mg total) by mouth every evening., Disp: 90 tablet, Rfl: 1 .  butalbital-acetaminophen-caffeine (FIORICET) 50-325-40 MG tablet, Take 1 tablet by mouth 2 (two) times daily as needed for headache., Disp: , Rfl:  .  cetirizine (ZYRTEC) 10 MG tablet, Take 10 mg by mouth daily with supper. , Disp: , Rfl:  .  Cholecalciferol (VITAMIN D3) 50 MCG (2000 UT) capsule, Take 4,000 Units by mouth daily. , Disp: , Rfl:  .  clopidogrel (PLAVIX) 75 MG tablet, Take 1 tablet (75 mg total) by mouth daily. Stop taking on 06/02/20., Disp: 90 tablet, Rfl: 1 .  Continuous Blood Gluc Receiver (DEXCOM G6 RECEIVER) DEVI, USE TO CHECK BLOOD SUGAR UP TO 4 TIMES DAILY AS DIRECTED. 1 DEVICE PER YEAR. DX: E11.9, Disp: 1 each, Rfl: 1 .  Continuous Blood Gluc Sensor (DEXCOM G6 SENSOR) MISC, USE TO CHECK BLOOD SUGAR UP TO 4 TIMES DAILY AS DIRECTED. CHANGE SENSOR EVERY 30 DAYS. DX: E11.9, Disp: 3 each, Rfl: 4 .   Continuous Blood Gluc Transmit (DEXCOM G6 TRANSMITTER) MISC, USE TO CHECK BLOOD SUGAR UP TO 4 TIMES DAILY AS DIRECTED. CHANGE TRANSMITTER EVERY 3 MONTHS. DX: E11.9, Disp: 1 each, Rfl: 12 .  docusate sodium (COLACE) 100 MG capsule, Take 100 mg by mouth daily as needed for mild constipation or moderate constipation. , Disp: , Rfl:  .  donepezil (ARICEPT) 5 MG tablet, Take 1 tablet (5 mg total) by mouth at bedtime., Disp: 90 tablet, Rfl: 3 .  famotidine (PEPCID) 20 MG tablet, Take 1 tablet (20 mg total) by mouth 2 (two) times daily with a meal., Disp: 180 tablet, Rfl: 3 .  furosemide (LASIX) 20 MG tablet, TAKE 1 TABLET BY MOUTH  DAILY AS NEEDED, Disp: 90 tablet, Rfl: 3 .  glucose blood (ONETOUCH ULTRA) test strip, Check BS TID Dx E11.59, Disp: 400 each, Rfl: 3 .  insulin glargine (LANTUS SOLOSTAR) 100 UNIT/ML Solostar Pen, INJECT SUBCUTANEOUSLY 56 UNITS AT BEDTIME, Disp: 60 mL, Rfl: 3 .  insulin lispro (HUMALOG) 100 UNIT/ML injection, Inject 0-20 Units into the skin 3 (three) times daily as needed for high blood sugar., Disp: , Rfl:  .  isosorbide mononitrate (IMDUR) 30 MG 24 hr tablet, TAKE 1 TABLET BY MOUTH IN  THE EVENING, Disp: 90 tablet, Rfl: 3 .  metFORMIN (GLUCOPHAGE-XR) 500 MG 24 hr tablet, Take 2 tablets (1,000 mg total) by mouth 2 (two) times daily., Disp: 360 tablet, Rfl: 3 .  nitroGLYCERIN (NITROSTAT) 0.4 MG SL tablet, DISSOLVE ONE TABLET UNDER THE TONGUE EVERY 5 MINUTES AS NEEDED FOR CHEST PAIN.  DO NOT EXCEED A TOTAL OF 3 DOSES IN 15 MINUTES, Disp: 25 tablet, Rfl: 3 .  Omega-3 Fatty Acids (FISH OIL) 1200 MG CAPS, Take 1,200-2,400 mg by mouth See admin instructions. Tale 1200 mg in the morning and 2400 mg in the evening, Disp: , Rfl:  .  PARoxetine (PAXIL) 20 MG tablet, Take 1 tablet (20 mg total) by mouth daily., Disp: 90 tablet, Rfl: 3 .  tamsulosin (FLOMAX) 0.4 MG CAPS capsule, Take 1 capsule (0.4 mg total) by mouth daily., Disp: 90 capsule, Rfl: 3 .  Turmeric Curcumin 500 MG CAPS, Take  500 mg by mouth daily., Disp: , Rfl:  .  vitamin B-12 (CYANOCOBALAMIN) 1000 MCG tablet, Take 2,000 mcg by mouth daily., Disp: , Rfl:   Past Medical History: Past Medical History:  Diagnosis Date  . Anxiety   . Aortic atherosclerosis ([place]) 12/19/2017  . Aortic stenosis    26 mm Edwards S3U THV November 2021  . Arthritis   . Bursitis of hip   . Cervical spondylosis   . Chronic headaches   . Coronary atherosclerosis of native coronary artery    BMS nondominant RCA 12/2004, DES x2 to mid LAD 11/2019  . Essential hypertension   . GERD (gastroesophageal reflux disease)   . History of adenomatous polyp of colon   . History of kidney stones   . History of MI (myocardial infarction) 12/2004  . History of viral meningitis 02/28/2018   December 2019  . Hyperlipidemia   . Internal hemorrhoids   . Low back pain   . Nocturia more than twice per night   . OSA (obstructive sleep apnea)   . Spinal stenosis   . Type 2 diabetes mellitus treated with insulin ([place])   . Wears dentures    Upper    Tobacco Use: Social History   Tobacco Use  Smoking Status Former Smoker  . Packs/day: 1.00  . Years: 20.00  . Pack years: 20.00  . Types: Cigarettes  . Start date: 01/18/1963  . Quit date: 08/11/1988  . Years since quitting: 31.7  Smokeless Tobacco Former Systems developer  . Types: Chew  . Quit date: 01/11/1989  Tobacco Comment   chewed 1 pack tobacco/day for 15 years    Labs: Recent Review Flowsheet Data    Labs for ITP Cardiac and Pulmonary Rehab Latest Ref Rng & Units 11/30/2019 12/04/2019 12/04/2019 12/28/2019 04/02/2020   Cholestrol 100 - 199 mg/dL - - - 118 118   LDLCALC 0 - 99 mg/dL - - - 61 61   HDL >39 mg/dL - - - 36(L) 37(L)   Trlycerides 0 - 149 mg/dL - - - 117 107   Hemoglobin A1c <7.0 % 8.2(H) - - 7.6(H) 7.1(H)   PHART 7.350 - 7.450 7.422 - - - -   PCO2ART 32.0 - 48.0 mmHg 40.2 - - - -   HCO3 20.0 - 28.0 mmol/L 25.7 - - - -   TCO2 22 - 32 mmol/L - 24 28 - -   O2SAT % 94.8 - - - -       Capillary Blood Glucose: Lab Results  Component Value Date   GLUCAP 224 (H) 12/05/2019   GLUCAP 223 (H) 12/05/2019   GLUCAP 223 (H) 12/04/2019   GLUCAP 264 (H) 12/04/2019   GLUCAP 220 (H) 12/04/2019    POCT Glucose    Row Name 02/14/20 1440  POCT Blood Glucose   Pre-Exercise 236 mg/dL              Exercise Target Goals: Exercise Program Goal: Individual exercise prescription set using results from initial 6 min walk test and THRR while considering  patient's activity barriers and safety.   Exercise Prescription Goal: Starting with aerobic activity 30 plus minutes a day, 3 days per week for initial exercise prescription. Provide home exercise prescription and guidelines that participant acknowledges understanding prior to discharge.  Activity Barriers & Risk Stratification:  Activity Barriers & Cardiac Risk Stratification - 02/14/20 1253      Activity Barriers & Cardiac Risk Stratification   Activity Barriers Back Problems;Left Hip Replacement;Right Hip Replacement;Left Knee Replacement;Right Knee Replacement;Neck/Spine Problems;Joint Problems;Deconditioning;Shortness of Breath;Balance Concerns    Cardiac Risk Stratification High           6 Minute Walk:  6 Minute Walk    Row Name 02/14/20 1407         6 Minute Walk   Phase Initial     Distance 600 feet     Walk Time 6 minutes     # of Rest Breaks 1  took a 2 minute break     MPH 1.1     METS 0.7     RPE 12     VO2 Peak 2.48     Symptoms Yes (comment)     Comments back pain 5/10, and shortness of breath     Resting HR 62 bpm     Resting BP 146/62     Resting Oxygen Saturation  96 %     Exercise Oxygen Saturation  during 6 min walk 96 %     Max Ex. HR 82 bpm     Max Ex. BP 188/62     2 Minute Post BP 156/62            Oxygen Initial Assessment:   Oxygen Re-Evaluation:   Oxygen Discharge (Final Oxygen Re-Evaluation):   Initial Exercise Prescription:  Initial Exercise  Prescription - 02/14/20 1400      Date of Initial Exercise RX and Referring Provider   Date 02/14/20    Referring Provider Domenic Polite    Expected Discharge Date 05/07/20      NuStep   Level 1    SPM 60    Minutes 22      Arm Ergometer   Level 1    RPM 40    Minutes 17      Prescription Details   Frequency (times per week) 3    Duration Progress to 30 minutes of continuous aerobic without signs/symptoms of physical distress      Intensity   THRR 40-80% of Max Heartrate 56-112    Ratings of Perceived Exertion 11-13      Progression   Progression Continue progressive overload as per policy without signs/symptoms or physical distress.      Resistance Training   Training Prescription Yes    Weight 2    Reps 10-15           Perform Capillary Blood Glucose checks as needed.  Exercise Prescription Changes:   Exercise Prescription Changes    Row Name 02/19/20 1000 03/03/20 0824 03/18/20 1000 03/24/20 1500 03/31/20 0805     Response to Exercise   Blood Pressure (Admit) 160/72 144/62 138/80 -- 158/78   Blood Pressure (Exercise) 178/76 170/72 150/60 -- 158/62   Blood Pressure (Exit) 170/80 156/76 148/62 -- 140/62   Heart Rate (  Admit) 76 bpm 68 bpm 72 bpm -- 71 bpm   Heart Rate (Exercise) 99 bpm 107 bpm 101 bpm -- 99 bpm   Heart Rate (Exit) 76 bpm 75 bpm 77 bpm -- 77 bpm   Rating of Perceived Exertion (Exercise) 13 13 12  -- 12   Duration Continue with 30 min of aerobic exercise without signs/symptoms of physical distress. Continue with 30 min of aerobic exercise without signs/symptoms of physical distress. Continue with 30 min of aerobic exercise without signs/symptoms of physical distress. -- Continue with 30 min of aerobic exercise without signs/symptoms of physical distress.   Intensity THRR unchanged THRR unchanged THRR unchanged -- THRR unchanged     Progression   Progression Continue to progress workloads to maintain intensity without signs/symptoms of physical  distress. Continue to progress workloads to maintain intensity without signs/symptoms of physical distress. Continue to progress workloads to maintain intensity without signs/symptoms of physical distress. -- Continue to progress workloads to maintain intensity without signs/symptoms of physical distress.     Resistance Training   Training Prescription Yes Yes Yes -- Yes   Weight 2 3 lbs 3 lbs -- 3 lbs   Reps 10-15 10-15 10-15 -- 10-15   Time 10 Minutes 10 Minutes 10 Minutes -- 10 Minutes     NuStep   Level 1 1 2  -- 3   SPM 68 79 81 -- 79   Minutes 22 22 22  -- 22   METs 1.8 1.9 1.9 -- 1.9     Arm Ergometer   Level 1 1 2  -- 3   RPM 48 54 56 -- 48   Minutes 17 17 17  -- 17   METs 1.5 1.5 2.7 -- 2.1     Home Exercise Plan   Plans to continue exercise at -- -- -- Home (comment) --   Frequency -- -- -- Add 2 additional days to program exercise sessions. --   Initial Home Exercises Provided -- -- -- 03/24/20 --   [place] Name 04/11/20 1545             Response to Exercise   Blood Pressure (Admit) 146/58       Blood Pressure (Exercise) 168/60       Blood Pressure (Exit) 162/66       Heart Rate (Admit) 84 bpm       Heart Rate (Exercise) 103 bpm       Heart Rate (Exit) 74 bpm       Rating of Perceived Exertion (Exercise) 12       Duration Continue with 30 min of aerobic exercise without signs/symptoms of physical distress.       Intensity THRR unchanged               Progression   Progression Continue to progress workloads to maintain intensity without signs/symptoms of physical distress.               Resistance Training   Training Prescription Yes       Weight 3 lbs       Reps 10-15       Time 10 Minutes               NuStep   Level 3       SPM 58       Minutes 22       METs 1.9               Arm Ergometer   Level 3  RPM 52       Minutes 17       METs 2.2              Exercise Comments:   Exercise Comments    Row Name 02/18/20 1557 03/24/20 1550          Exercise Comments Patient completed first exercise session today. He tolerated exercise well with no complaints. He enjoyed rehab today and is enthusiastic to come back. Patient and I reviewed home exercise prescription. He is not currently exercising at home but will start to walk.             Exercise Goals and Review:   Exercise Goals    Row Name 02/14/20 1412 02/19/20 1027 03/17/20 1545 04/11/20 0944       Exercise Goals   Increase Physical Activity Yes Yes Yes Yes    Intervention -- Provide advice, education, support and counseling about physical activity/exercise needs.;Develop an individualized exercise prescription for aerobic and resistive training based on initial evaluation findings, risk stratification, comorbidities and participant's personal goals. Provide advice, education, support and counseling about physical activity/exercise needs.;Develop an individualized exercise prescription for aerobic and resistive training based on initial evaluation findings, risk stratification, comorbidities and participant's personal goals. Provide advice, education, support and counseling about physical activity/exercise needs.;Develop an individualized exercise prescription for aerobic and resistive training based on initial evaluation findings, risk stratification, comorbidities and participant's personal goals.    Expected Outcomes Long Term: Add in home exercise to make exercise part of routine and to increase amount of physical activity.;Short Term: Attend rehab on a regular basis to increase amount of physical activity.;Long Term: Exercising regularly at least 3-5 days a week. Long Term: Add in home exercise to make exercise part of routine and to increase amount of physical activity.;Short Term: Attend rehab on a regular basis to increase amount of physical activity.;Long Term: Exercising regularly at least 3-5 days a week. Long Term: Add in home exercise to make exercise part of routine and  to increase amount of physical activity.;Short Term: Attend rehab on a regular basis to increase amount of physical activity.;Long Term: Exercising regularly at least 3-5 days a week. Long Term: Add in home exercise to make exercise part of routine and to increase amount of physical activity.;Short Term: Attend rehab on a regular basis to increase amount of physical activity.;Long Term: Exercising regularly at least 3-5 days a week.    Increase Strength and Stamina Yes Yes Yes Yes    Intervention -- Provide advice, education, support and counseling about physical activity/exercise needs.;Develop an individualized exercise prescription for aerobic and resistive training based on initial evaluation findings, risk stratification, comorbidities and participant's personal goals. Provide advice, education, support and counseling about physical activity/exercise needs.;Develop an individualized exercise prescription for aerobic and resistive training based on initial evaluation findings, risk stratification, comorbidities and participant's personal goals. Provide advice, education, support and counseling about physical activity/exercise needs.;Develop an individualized exercise prescription for aerobic and resistive training based on initial evaluation findings, risk stratification, comorbidities and participant's personal goals.    Expected Outcomes Short Term: Increase workloads from initial exercise prescription for resistance, speed, and METs.;Short Term: Perform resistance training exercises routinely during rehab and add in resistance training at home;Long Term: Improve cardiorespiratory fitness, muscular endurance and strength as measured by increased METs and functional capacity (6MWT) Short Term: Increase workloads from initial exercise prescription for resistance, speed, and METs.;Short Term: Perform resistance training exercises routinely during rehab and add in  resistance training at home;Long Term: Improve  cardiorespiratory fitness, muscular endurance and strength as measured by increased METs and functional capacity (6MWT) Short Term: Increase workloads from initial exercise prescription for resistance, speed, and METs.;Short Term: Perform resistance training exercises routinely during rehab and add in resistance training at home;Long Term: Improve cardiorespiratory fitness, muscular endurance and strength as measured by increased METs and functional capacity (6MWT) Short Term: Increase workloads from initial exercise prescription for resistance, speed, and METs.;Short Term: Perform resistance training exercises routinely during rehab and add in resistance training at home;Long Term: Improve cardiorespiratory fitness, muscular endurance and strength as measured by increased METs and functional capacity (6MWT)    Able to understand and use rate of perceived exertion (RPE) scale Yes Yes Yes Yes    Intervention Provide education and explanation on how to use RPE scale Provide education and explanation on how to use RPE scale Provide education and explanation on how to use RPE scale Provide education and explanation on how to use RPE scale    Expected Outcomes Short Term: Able to use RPE daily in rehab to express subjective intensity level;Long Term:  Able to use RPE to guide intensity level when exercising independently Short Term: Able to use RPE daily in rehab to express subjective intensity level;Long Term:  Able to use RPE to guide intensity level when exercising independently Short Term: Able to use RPE daily in rehab to express subjective intensity level;Long Term:  Able to use RPE to guide intensity level when exercising independently Short Term: Able to use RPE daily in rehab to express subjective intensity level;Long Term:  Able to use RPE to guide intensity level when exercising independently    Knowledge and understanding of Target Heart Rate Range (THRR) Yes Yes Yes Yes    Intervention Provide education  and explanation of THRR including how the numbers were predicted and where they are located for reference Provide education and explanation of THRR including how the numbers were predicted and where they are located for reference Provide education and explanation of THRR including how the numbers were predicted and where they are located for reference Provide education and explanation of THRR including how the numbers were predicted and where they are located for reference    Expected Outcomes Short Term: Able to state/look up THRR;Long Term: Able to use THRR to govern intensity when exercising independently;Short Term: Able to use daily as guideline for intensity in rehab Short Term: Able to state/look up THRR;Long Term: Able to use THRR to govern intensity when exercising independently;Short Term: Able to use daily as guideline for intensity in rehab Short Term: Able to state/look up THRR;Long Term: Able to use THRR to govern intensity when exercising independently;Short Term: Able to use daily as guideline for intensity in rehab Short Term: Able to state/look up THRR;Long Term: Able to use THRR to govern intensity when exercising independently;Short Term: Able to use daily as guideline for intensity in rehab    Able to check pulse independently Yes Yes Yes Yes    Intervention Review the importance of being able to check your own pulse for safety during independent exercise;Provide education and demonstration on how to check pulse in carotid and radial arteries. Review the importance of being able to check your own pulse for safety during independent exercise;Provide education and demonstration on how to check pulse in carotid and radial arteries. Review the importance of being able to check your own pulse for safety during independent exercise;Provide education and demonstration on how  to check pulse in carotid and radial arteries. Review the importance of being able to check your own pulse for safety during  independent exercise;Provide education and demonstration on how to check pulse in carotid and radial arteries.    Expected Outcomes Short Term: Able to explain why pulse checking is important during independent exercise;Long Term: Able to check pulse independently and accurately Short Term: Able to explain why pulse checking is important during independent exercise;Long Term: Able to check pulse independently and accurately Short Term: Able to explain why pulse checking is important during independent exercise;Long Term: Able to check pulse independently and accurately Short Term: Able to explain why pulse checking is important during independent exercise;Long Term: Able to check pulse independently and accurately    Understanding of Exercise Prescription Yes Yes Yes Yes    Intervention Provide education, explanation, and written materials on patient's individual exercise prescription Provide education, explanation, and written materials on patient's individual exercise prescription Provide education, explanation, and written materials on patient's individual exercise prescription Provide education, explanation, and written materials on patient's individual exercise prescription    Expected Outcomes Short Term: Able to explain program exercise prescription;Long Term: Able to explain home exercise prescription to exercise independently Short Term: Able to explain program exercise prescription;Long Term: Able to explain home exercise prescription to exercise independently Short Term: Able to explain program exercise prescription;Long Term: Able to explain home exercise prescription to exercise independently Short Term: Able to explain program exercise prescription;Long Term: Able to explain home exercise prescription to exercise independently           Exercise Goals Re-Evaluation :  Exercise Goals Re-Evaluation    Row Name 02/19/20 1027 03/18/20 1013 04/11/20 1545         Exercise Goal Re-Evaluation    Exercise Goals Review Increase Physical Activity;Increase Strength and Stamina;Able to understand and use rate of perceived exertion (RPE) scale;Knowledge and understanding of Target Heart Rate Range (THRR);Able to check pulse independently;Understanding of Exercise Prescription Increase Physical Activity;Increase Strength and Stamina;Able to understand and use rate of perceived exertion (RPE) scale;Knowledge and understanding of Target Heart Rate Range (THRR);Able to check pulse independently;Understanding of Exercise Prescription Increase Physical Activity;Increase Strength and Stamina;Able to understand and use rate of perceived exertion (RPE) scale;Knowledge and understanding of Target Heart Rate Range (THRR);Able to check pulse independently;Understanding of Exercise Prescription     Comments Patient has completed 1 exercise session. He tolerated exercise well with no complaints. He had a positive attitude about coming to rehab. He is currently exercising at 1.8 METs on the NuStep. Will continue to monitor and progress as able. Patient has completed 13 exercise sessions. He has tolerated exercise well. He has complained of back pain that prevents him from completing the warm up and strength training part. He is progressing slowly. He is currently exercising at 1.9 METs on the NuStep. Will continue to monitor and progress as able. patient has completed 24 exercise sessions. He has tolerated exercise well. He has been increasing intensities slowly and progressing slowly. He complains of back pain most days and the back pain prevents him from completing the resistance training. His systolic blood pressure is elevated at rest and during exercise. His blood pressure does not come down much after exercise. He is currently exercising at 2.2 METs in the Arm ergometer. Will continue to monitor and progress as able.     Expected Outcomes Through exercise at rehab and with a home exercise program, patient will reach  their goals. Through exercise  at rehab and with a home exercise program, patient will reach their goals. Through exercise at rehab and with a home exercise program, patient will reach their goals.             Discharge Exercise Prescription (Final Exercise Prescription Changes):  Exercise Prescription Changes - 04/11/20 1545      Response to Exercise   Blood Pressure (Admit) 146/58    Blood Pressure (Exercise) 168/60    Blood Pressure (Exit) 162/66    Heart Rate (Admit) 84 bpm    Heart Rate (Exercise) 103 bpm    Heart Rate (Exit) 74 bpm    Rating of Perceived Exertion (Exercise) 12    Duration Continue with 30 min of aerobic exercise without signs/symptoms of physical distress.    Intensity THRR unchanged      Progression   Progression Continue to progress workloads to maintain intensity without signs/symptoms of physical distress.      Resistance Training   Training Prescription Yes    Weight 3 lbs    Reps 10-15    Time 10 Minutes      NuStep   Level 3    SPM 58    Minutes 22    METs 1.9      Arm Ergometer   Level 3    RPM 52    Minutes 17    METs 2.2           Nutrition:  Target Goals: Understanding of nutrition guidelines, daily intake of sodium 1500mg , cholesterol 200mg , calories 30% from fat and 7% or less from saturated fats, daily to have 5 or more servings of fruits and vegetables.  Biometrics:  Pre Biometrics - 03/31/20 0806      Pre Biometrics   Weight 108.8 kg            Nutrition Therapy Plan and Nutrition Goals:  Nutrition Therapy & Goals - 03/10/20 0950      Personal Nutrition Goals   Comments We will continue to provide heart healthy nutritional education through handouts.      Intervention Plan   Intervention Nutrition handout(s) given to patient.           Nutrition Assessments:  Nutrition Assessments - 02/14/20 1302      MEDFICTS Scores   Pre Score 69          MEDIFICTS Score Key:  ?70 Need to make dietary changes    40-70 Heart Healthy Diet  ? 40 Therapeutic Level Cholesterol Diet   Picture Your Plate Scores:  <69 Unhealthy dietary pattern with much room for improvement.  41-50 Dietary pattern unlikely to meet recommendations for good health and room for improvement.  51-60 More healthful dietary pattern, with some room for improvement.   >60 Healthy dietary pattern, although there may be some specific behaviors that could be improved.    Nutrition Goals Re-Evaluation:   Nutrition Goals Discharge (Final Nutrition Goals Re-Evaluation):   Psychosocial: Target Goals: Acknowledge presence or absence of significant depression and/or stress, maximize coping skills, provide positive support system. Participant is able to verbalize types and ability to use techniques and skills needed for reducing stress and depression.  Initial Review & Psychosocial Screening:  Initial Psych Review & Screening - 02/14/20 1254      Initial Review   Current issues with None Identified      Family Dynamics   Good Support System? Yes    Comments His wife is his support system. He talks with his  children, grandchildren, and great grandchildren several times per week.      Barriers   Psychosocial barriers to participate in program There are no identifiable barriers or psychosocial needs.      Screening Interventions   Interventions Encouraged to exercise    Expected Outcomes Short Term goal: Utilizing psychosocial counselor, staff and physician to assist with identification of specific Stressors or current issues interfering with healing process. Setting desired goal for each stressor or current issue identified.;Long Term Goal: Stressors or current issues are controlled or eliminated.;Short Term goal: Identification and review with participant of any Quality of Life or Depression concerns found by scoring the questionnaire.;Long Term goal: The participant improves quality of Life and PHQ9 Scores as seen by post  scores and/or verbalization of changes           Quality of Life Scores:  Quality of Life - 02/14/20 1414      Quality of Life   Select Quality of Life      Quality of Life Scores   Health/Function Pre 24 %    Socioeconomic Pre 25.64 %    Psych/Spiritual Pre 24 %    Family Pre 21.6 %    GLOBAL Pre 23.98 %          Scores of 19 and below usually indicate a poorer quality of life in these areas.  A difference of  2-3 points is a clinically meaningful difference.  A difference of 2-3 points in the total score of the Quality of Life Index has been associated with significant improvement in overall quality of life, self-image, physical symptoms, and general health in studies assessing change in quality of life.  PHQ-9: Recent Review Flowsheet Data    Depression screen [facility] 2/9 04/02/2020 02/14/2020 12/28/2019 10/01/2019 06/22/2019   Decreased Interest 0 0 0 0 0   Down, Depressed, Hopeless 0 0 0 0 0   PHQ - 2 Score 0 0 0 0 0   Altered sleeping - 0 - - -   Tired, decreased energy - 1 - - -   Change in appetite - 0 - - -   Feeling bad or failure about yourself  - 0 - - -   Trouble concentrating - 0 - - -   Moving slowly or fidgety/restless - 0 - - -   Suicidal thoughts - 0 - - -   PHQ-9 Score - 1 - - -   Difficult doing work/chores - Not difficult at all - - -     Interpretation of Total Score  Total Score Depression Severity:  1-4 = Minimal depression, 5-9 = Mild depression, 10-14 = Moderate depression, 15-19 = Moderately severe depression, 20-27 = Severe depression   Psychosocial Evaluation and Intervention:  Psychosocial Evaluation - 02/14/20 1306      Psychosocial Evaluation & Interventions   Interventions Encouraged to exercise with the program and follow exercise prescription    Comments Patient has no identifiable psychosocial issues at orientation.    Expected Outcomes Patient will continue to not have any identifiable psychosocial issues in the cardiac rehab program.     Continue Psychosocial Services  No Follow up required           Psychosocial Re-Evaluation:  Psychosocial Re-Evaluation    [place] Name 02/18/20 0853 03/10/20 0950 04/07/20 0907         Psychosocial Re-Evaluation   Current issues with None Identified -- None Identified     Comments Patient is new to the program  starting today 02/18/20. He has been taking Paxil 20 mg qd since 2012. His initial QOL score was 23.98% overall and his PHQ-9 score was 1. He has no psychosocial issues identified. Will continue to monitor. Patient has completed 10 sessions. He continues to take Paxil daily and has no psychosocia issues identified. He demonstrates a positive outlook overall but does no always seem to enjoy exercising. He is very interactive with others in his class and the staff. Patient has completed 22 sessions. He continues to take Paxil daily and has no psychosocial issues identified. He continues to demonstrate a positive outlook overall but does not always seem to enjoy exercising. He is very interactive with others in his class and the staff.     Expected Outcomes Patient will have no psychosocial issues identifieda at discharge. Patient will have no psychosocial issues identifieda at discharge. Patient will have no psychosocial issues identifieda at discharge.     Interventions Stress management education;Encouraged to attend Cardiac Rehabilitation for the exercise;Relaxation education Stress management education;Encouraged to attend Cardiac Rehabilitation for the exercise;Relaxation education Stress management education;Encouraged to attend Cardiac Rehabilitation for the exercise;Relaxation education     Continue Psychosocial Services  No Follow up required No Follow up required No Follow up required            Psychosocial Discharge (Final Psychosocial Re-Evaluation):  Psychosocial Re-Evaluation - 04/07/20 0907      Psychosocial Re-Evaluation   Current issues with None Identified    Comments  Patient has completed 22 sessions. He continues to take Paxil daily and has no psychosocial issues identified. He continues to demonstrate a positive outlook overall but does not always seem to enjoy exercising. He is very interactive with others in his class and the staff.    Expected Outcomes Patient will have no psychosocial issues identifieda at discharge.    Interventions Stress management education;Encouraged to attend Cardiac Rehabilitation for the exercise;Relaxation education    Continue Psychosocial Services  No Follow up required           Vocational Rehabilitation: Provide vocational rehab assistance to qualifying candidates.   Vocational Rehab Evaluation & Intervention:  Vocational Rehab - 02/14/20 1254      Initial Vocational Rehab Evaluation & Intervention   Assessment shows need for Vocational Rehabilitation No      Vocational Rehab Re-Evaulation   Comments He is retired and is not interested in returning to work.           Education: Education Goals: Education classes will be provided on a weekly basis, covering required topics. Participant will state understanding/return demonstration of topics presented.  Learning Barriers/Preferences:  Learning Barriers/Preferences - 02/14/20 1255      Learning Barriers/Preferences   Learning Barriers Hearing   cannot hear out of right ear   Learning Preferences Audio;Video;Computer/Internet;Group Instruction;Individual Instruction;Pictoral;Skilled Demonstration;Verbal Instruction;Written Material           Education Topics: Hypertension, Hypertension Reduction -Define heart disease and high blood pressure. Discus how high blood pressure affects the body and ways to reduce high blood pressure.   Exercise and Your Heart -Discuss why it is important to exercise, the FITT principles of exercise, normal and abnormal responses to exercise, and how to exercise safely.   Angina -Discuss definition of angina, causes of  angina, treatment of angina, and how to decrease risk of having angina.   Cardiac Medications -Review what the following cardiac medications are used for, how they affect the body, and side effects that may occur when  taking the medications.  Medications include Aspirin, Beta blockers, calcium channel blockers, ACE Inhibitors, angiotensin receptor blockers, diuretics, digoxin, and antihyperlipidemics.   Congestive Heart Failure -Discuss the definition of CHF, how to live with CHF, the signs and symptoms of CHF, and how keep track of weight and sodium intake.   Heart Disease and Intimacy -Discus the effect sexual activity has on the heart, how changes occur during intimacy as we age, and safety during sexual activity. Flowsheet Row CARDIAC REHAB PHASE II EXERCISE from 04/09/2020 in [place]  Date 02/20/20  Educator [facility]  Instruction Review Code 2- Demonstrated Understanding      Smoking Cessation / COPD -Discuss different methods to quit smoking, the health benefits of quitting smoking, and the definition of COPD. Flowsheet Row CARDIAC REHAB PHASE II EXERCISE from 04/09/2020 in [place]  Date 02/27/20  Educator DF  Instruction Review Code 2- Demonstrated Understanding      Nutrition I: Fats -Discuss the types of cholesterol, what cholesterol does to the heart, and how cholesterol levels can be controlled. Flowsheet Row CARDIAC REHAB PHASE II EXERCISE from 04/09/2020 in [place]  Date 03/05/20  Educator mk  Instruction Review Code 2- Demonstrated Understanding      Nutrition II: Labels -Discuss the different components of food labels and how to read food label [place] from 04/09/2020 in [place]  Date 03/12/20  Educator DJ  Instruction Review Code 1- Verbalizes Understanding      Heart Parts/Heart Disease and PAD -Discuss the anatomy of the  heart, the pathway of blood circulation through the heart, and these are affected by heart disease. Flowsheet Row CARDIAC REHAB PHASE II EXERCISE from 04/09/2020 in [place]  Date 03/19/20  Educator DJ  Instruction Review Code 1- Verbalizes Understanding      Stress I: Signs and Symptoms -Discuss the causes of stress, how stress may lead to anxiety and depression, and ways to limit stress. Flowsheet Row CARDIAC REHAB PHASE II EXERCISE from 04/09/2020 in [place]  Date 03/26/20  Educator mk  Instruction Review Code 2- Demonstrated Understanding      Stress II: Relaxation -Discuss different types of relaxation techniques to limit stress. Flowsheet Row CARDIAC REHAB PHASE II EXERCISE from 04/09/2020 in [place]  Date 04/02/20  Educator mk  Instruction Review Code 2- Demonstrated Understanding      Warning Signs of Stroke / TIA -Discuss definition of a stroke, what the signs and symptoms are of a stroke, and how to identify when someone is having stroke. Flowsheet Row CARDIAC REHAB PHASE II EXERCISE from 04/09/2020 in [place]  Date 04/09/20  Educator mk  Instruction Review Code 2- Demonstrated Understanding      Knowledge Questionnaire Score:  Knowledge Questionnaire Score - 02/14/20 1300      Knowledge Questionnaire Score   Pre Score 20/24           Core Components/Risk Factors/Patient Goals at Admission:  Personal Goals and Risk Factors at Admission - 02/14/20 1256      Core Components/Risk Factors/Patient Goals on Admission    Weight Management Yes;Weight Loss   He would like to lose 10 lbs while in the program   Intervention Weight Management: Develop a combined nutrition and exercise program designed to reach desired caloric intake, while maintaining appropriate intake of nutrient and fiber, sodium and fats, and appropriate energy expenditure required for  the weight  goal.;Weight Management: Provide education and appropriate resources to help participant work on and attain dietary goals.;Weight Management/Obesity: Establish reasonable short term and long term weight goals.;Obesity: Provide education and appropriate resources to help participant work on and attain dietary goals.    Expected Outcomes Short Term: Continue to assess and modify interventions until short term weight is achieved;Long Term: Adherence to nutrition and physical activity/exercise program aimed toward attainment of established weight goal;Weight Maintenance: Understanding of the daily nutrition guidelines, which includes 25-35% calories from fat, 7% or less cal from saturated fats, less than 200mg  cholesterol, less than 1.5gm of sodium, & 5 or more servings of fruits and vegetables daily;Weight Loss: Understanding of general recommendations for a balanced deficit meal plan, which promotes 1-2 lb weight loss per week and includes a negative energy balance of [phone] kcal/d;Understanding recommendations for meals to include 15-35% energy as protein, 25-35% energy from fat, 35-60% energy from carbohydrates, less than 200mg  of dietary cholesterol, 20-35 gm of total fiber daily;Understanding of distribution of calorie intake throughout the day with the consumption of 4-5 meals/snacks;Weight Gain: Understanding of general recommendations for a high calorie, high protein meal plan that promotes weight gain by distributing calorie intake throughout the day with the consumption for 4-5 meals, snacks, and/or supplements    Improve shortness of breath with ADL's Yes    Intervention Provide education, individualized exercise plan and daily activity instruction to help decrease symptoms of SOB with activities of daily living.    Expected Outcomes Short Term: Improve cardiorespiratory fitness to achieve a reduction of symptoms when performing ADLs;Long Term: Be able to perform more ADLs without symptoms or delay the  onset of symptoms    Diabetes Yes    Intervention Provide education about signs/symptoms and action to take for hypo/hyperglycemia.;Provide education about proper nutrition, including hydration, and aerobic/resistive exercise prescription along with prescribed medications to achieve blood glucose in normal ranges: Fasting glucose 65-99 mg/dL    Expected Outcomes Short Term: Participant verbalizes understanding of the signs/symptoms and immediate care of hyper/hypoglycemia, proper foot care and importance of medication, aerobic/resistive exercise and nutrition plan for blood glucose control.;Long Term: Attainment of HbA1C < 7%.    Hypertension Yes    Intervention Provide education on lifestyle modifcations including regular physical activity/exercise, weight management, moderate sodium restriction and increased consumption of fresh fruit, vegetables, and low fat dairy, alcohol moderation, and smoking cessation.;Monitor prescription use compliance.    Expected Outcomes Short Term: Continued assessment and intervention until BP is < 140/88mm HG in hypertensive participants. < 130/65mm HG in hypertensive participants with diabetes, heart failure or chronic kidney disease.;Long Term: Maintenance of blood pressure at goal levels.    Lipids Yes    Intervention Provide education and support for participant on nutrition & aerobic/resistive exercise along with prescribed medications to achieve LDL 70mg , HDL >40mg .    Expected Outcomes Short Term: Participant states understanding of desired cholesterol values and is compliant with medications prescribed. Participant is following exercise prescription and nutrition guidelines.;Long Term: Cholesterol controlled with medications as prescribed, with individualized exercise RX and with personalized nutrition plan. Value goals: LDL < 70mg , HDL > 40 mg.           Core Components/Risk Factors/Patient Goals Review:   Goals and Risk Factor Review    Row Name 02/18/20  0859 03/10/20 0953 04/07/20 0908         Core Components/Risk Factors/Patient Goals Review   Personal Goals Review Weight Management/Obesity;Diabetes Diabetes;Weight Management/Obesity;Hypertension Diabetes;Weight Management/Obesity;Hypertension  Review Patient is new to the program starting today 02/18/20. He has multiple risk factors for CAD and is participating in cardiac rehab for risk modification. His personal goals for the program are to lose 10 lbs in the program and more long term and to get stronger. Will continue to monitor as he works toward meeting these goals. Patient has completed 10 sessions losing 1 lb since his initial visit. He is doing well in the program with consistent attendance. His most recent A1C on file was 11/30/19 at 8.2% which has trended upward over the past 2 years. His blood pressure is hypertensive and will continue to monitor this and contact his MD if this trend continues. His personal goals for the program are to lose 10 lbs and get stronger. We will continue to monitor as he works toward meeting these goals. Patient has completed 22 sessions gaining 1 lb since last 30 day review. He continues to do well in the program with progression and consistent attendance. He has a lipid panel and A1C on 04/03/19 at 7.1% which is trending down. His HDL was lower at 37 mg/dl but improved LDL. He does no always report his glucose readings but they are usually >150 mg/dl. He continues to eat out at [facility] most mornings. His personal goals are to lose 10 bls and get stronger. He still is not able to usually complete the weight warm up due to his back. Will continue to monitor as he works toward meeting these goals.     Expected Outcomes Patient will complete the program meeting both personal and program goals. Patient will complete the program meeting both personal and program goals. Patient will complete the program meeting both personal and program goals.            Core  Components/Risk Factors/Patient Goals at Discharge (Final Review):   Goals and Risk Factor Review - 04/07/20 0908      Core Components/Risk Factors/Patient Goals Review   Personal Goals Review Diabetes;Weight Management/Obesity;Hypertension    Review Patient has completed 22 sessions gaining 1 lb since last 30 day review. He continues to do well in the program with progression and consistent attendance. He has a lipid panel and A1C on 04/03/19 at 7.1% which is trending down. His HDL was lower at 37 mg/dl but improved LDL. He does no always report his glucose readings but they are usually >150 mg/dl. He continues to eat out at [facility] most mornings. His personal goals are to lose 10 bls and get stronger. He still is not able to usually complete the weight warm up due to his back. Will continue to monitor as he works toward meeting these goals.    Expected Outcomes Patient will complete the program meeting both personal and program goals.           ITP Comments:   Comments: ITP REVIEW Pt is making expected progress toward Cardiac Rehab goals after completing 22 sessions. Recommend continued exercise, life style modification, education, and increased stamina and strength.

## 2020-04-18 ENCOUNTER — Encounter (HOSPITAL_COMMUNITY)
Admission: RE | Admit: 2020-04-18 | Discharge: 2020-04-18 | Disposition: A | Discharge status: Home / Self Care | Payer: Medicare Other | Source: Ambulatory Visit | Attending: Cardiology | Admitting: Cardiology

## 2020-04-18 ENCOUNTER — Other Ambulatory Visit: Payer: Self-pay

## 2020-04-18 DIAGNOSIS — Z955 Presence of coronary angioplasty implant and graft: Secondary | ICD-10-CM

## 2020-04-18 DIAGNOSIS — Z952 Presence of prosthetic heart valve: Secondary | ICD-10-CM | POA: Diagnosis not present

## 2020-04-18 NOTE — Progress Notes (Signed)
Daily Session Note  Patient Details  Name: [name] MRN: [id number] Date of Birth: 14-Jul-1940 Referring Provider:   Flowsheet Row CARDIAC REHAB PHASE II ORIENTATION from 02/14/2020 in [place]  Referring Provider Domenic Polite      Encounter Date: 04/18/2020  Check In:  Session Check In - 04/18/20 1445      Check-In   Supervising physician immediately available to respond to emergencies [facility] MD immediately available    Physician(s) Dr. Domenic Polite    Location [facility]    Staff Present Hoy Register, MS, ACSM-CEP, Exercise Physiologist;Debra Wynetta Emery, RN, BSN    Virtual Visit No    Medication changes reported     No    Fall or balance concerns reported    No    Tobacco Cessation No Change    Warm-up and Cool-down Performed as group-led instruction    Resistance Training Performed Yes    VAD Patient? No    PAD/SET Patient? No      Pain Assessment   Currently in Pain? No/denies    Multiple Pain Sites No           Capillary Blood Glucose: No results found for this or any previous visit (from the past 24 hour(s)).    Social History   Tobacco Use  Smoking Status Former Smoker  . Packs/day: 1.00  . Years: 20.00  . Pack years: 20.00  . Types: Cigarettes  . Start date: 01/18/1963  . Quit date: 08/11/1988  . Years since quitting: 31.7  Smokeless Tobacco Former Systems developer  . Types: Chew  . Quit date: 01/11/1989  Tobacco Comment   chewed 1 pack tobacco/day for 15 years    Goals Met:  Independence with exercise equipment Exercise tolerated well No report of cardiac concerns or symptoms Strength training completed today  Goals Unmet:  Not Applicable  Comments: checkout time is 1545   Dr. Kathie Dike is Medical Director for [facility] Pulmonary Rehab.

## 2020-04-21 ENCOUNTER — Other Ambulatory Visit: Payer: Self-pay

## 2020-04-21 ENCOUNTER — Encounter (HOSPITAL_COMMUNITY)
Admission: RE | Admit: 2020-04-21 | Discharge: 2020-04-21 | Disposition: A | Discharge status: Home / Self Care | Payer: Medicare Other | Source: Ambulatory Visit | Attending: Cardiology | Admitting: Cardiology

## 2020-04-21 DIAGNOSIS — Z952 Presence of prosthetic heart valve: Secondary | ICD-10-CM | POA: Diagnosis not present

## 2020-04-21 DIAGNOSIS — Z955 Presence of coronary angioplasty implant and graft: Secondary | ICD-10-CM | POA: Diagnosis not present

## 2020-04-21 NOTE — Progress Notes (Signed)
Daily Session Note  Patient Details  Name: [name] MRN: [id number] Date of Birth: 08/04/1940 Referring Provider:   Flowsheet Row CARDIAC REHAB PHASE II ORIENTATION from 02/14/2020 in [place]  Referring Provider Domenic Polite      Encounter Date: 04/21/2020  Check In:  Session Check In - 04/21/20 1459      Check-In   Supervising physician immediately available to respond to emergencies [facility] MD immediately available    Physician(s) Dr. Johnsie Cancel    Location [facility]    Staff Present Geanie Cooley, RN;Mad[place] Audria Nine, MS, Exercise Physiologist;Dalton Kris Mouton, MS, ACSM-CEP, Exercise Physiologist    Virtual Visit No    Medication changes reported     No    Fall or balance concerns reported    No    Tobacco Cessation No Change    Warm-up and Cool-down Performed as group-led instruction    Resistance Training Performed Yes    VAD Patient? No    PAD/SET Patient? No      Pain Assessment   Currently in Pain? No/denies    Multiple Pain Sites No           Capillary Blood Glucose: No results found for this or any previous visit (from the past 24 hour(s)).    Social History   Tobacco Use  Smoking Status Former Smoker  . Packs/day: 1.00  . Years: 20.00  . Pack years: 20.00  . Types: Cigarettes  . Start date: 01/18/1963  . Quit date: 08/11/1988  . Years since quitting: 31.7  Smokeless Tobacco Former Systems developer  . Types: Chew  . Quit date: 01/11/1989  Tobacco Comment   chewed 1 pack tobacco/day for 15 years    Goals Met:  Independence with exercise equipment Exercise tolerated well No report of cardiac concerns or symptoms Strength training completed today  Goals Unmet:  Not Applicable  Comments: check out at 3:45   Dr. Kathie Dike is Medical Director for [facility] Pulmonary Rehab.

## 2020-04-23 ENCOUNTER — Encounter (HOSPITAL_COMMUNITY): Payer: Medicare Other

## 2020-04-23 DIAGNOSIS — H02034 Senile entropion of left upper eyelid: Secondary | ICD-10-CM | POA: Diagnosis not present

## 2020-04-23 DIAGNOSIS — D485 Neoplasm of uncertain behavior of skin: Secondary | ICD-10-CM | POA: Diagnosis not present

## 2020-04-23 DIAGNOSIS — H53483 Generalized contraction of visual field, bilateral: Secondary | ICD-10-CM | POA: Diagnosis not present

## 2020-04-23 DIAGNOSIS — H02834 Dermatochalasis of left upper eyelid: Secondary | ICD-10-CM | POA: Diagnosis not present

## 2020-04-23 DIAGNOSIS — H57813 Brow ptosis, bilateral: Secondary | ICD-10-CM | POA: Diagnosis not present

## 2020-04-23 DIAGNOSIS — H02832 Dermatochalasis of right lower eyelid: Secondary | ICD-10-CM | POA: Diagnosis not present

## 2020-04-23 DIAGNOSIS — H0279 Other degenerative disorders of eyelid and periocular area: Secondary | ICD-10-CM | POA: Diagnosis not present

## 2020-04-23 DIAGNOSIS — H02835 Dermatochalasis of left lower eyelid: Secondary | ICD-10-CM | POA: Diagnosis not present

## 2020-04-23 DIAGNOSIS — H02423 Myogenic ptosis of bilateral eyelids: Secondary | ICD-10-CM | POA: Diagnosis not present

## 2020-04-23 DIAGNOSIS — H02024 Mechanical entropion of left upper eyelid: Secondary | ICD-10-CM | POA: Diagnosis not present

## 2020-04-23 DIAGNOSIS — H02413 Mechanical ptosis of bilateral eyelids: Secondary | ICD-10-CM | POA: Diagnosis not present

## 2020-04-23 DIAGNOSIS — H02831 Dermatochalasis of right upper eyelid: Secondary | ICD-10-CM | POA: Diagnosis not present

## 2020-04-25 ENCOUNTER — Encounter (HOSPITAL_COMMUNITY): Payer: Medicare Other

## 2020-04-25 DIAGNOSIS — Z794 Long term (current) use of insulin: Secondary | ICD-10-CM | POA: Diagnosis not present

## 2020-04-25 DIAGNOSIS — E119 Type 2 diabetes mellitus without complications: Secondary | ICD-10-CM | POA: Diagnosis not present

## 2020-04-28 ENCOUNTER — Encounter (HOSPITAL_COMMUNITY)
Admission: RE | Admit: 2020-04-28 | Discharge: 2020-04-28 | Disposition: A | Discharge status: Home / Self Care | Payer: Medicare Other | Source: Ambulatory Visit | Attending: Cardiology | Admitting: Cardiology

## 2020-04-28 ENCOUNTER — Other Ambulatory Visit: Payer: Self-pay

## 2020-04-28 VITALS — Wt 239.4 lb

## 2020-04-28 DIAGNOSIS — Z952 Presence of prosthetic heart valve: Secondary | ICD-10-CM

## 2020-04-28 DIAGNOSIS — Z955 Presence of coronary angioplasty implant and graft: Secondary | ICD-10-CM

## 2020-04-28 DIAGNOSIS — G4733 Obstructive sleep apnea (adult) (pediatric): Secondary | ICD-10-CM | POA: Diagnosis not present

## 2020-04-28 NOTE — Progress Notes (Signed)
Daily Session Note  Patient Details  Name: MIZAEL SAGAR MRN: [id number] Date of Birth: August 02, 1940 Referring Provider:   Flowsheet Row CARDIAC REHAB PHASE II ORIENTATION from 02/14/2020 in [place]  Referring Provider Domenic Polite      Encounter Date: 04/28/2020  Check In:  Session Check In - 04/28/20 1445      Check-In   Supervising physician immediately available to respond to emergencies [facility] MD immediately available    Physician(s) Dr. Harl Bowie    Location [facility]    Staff Present Cathren Harsh, MS, Exercise Physiologist;Dalton Kris Mouton, MS, ACSM-CEP, Exercise Physiologist    Virtual Visit No    Medication changes reported     No    Fall or balance concerns reported    No    Tobacco Cessation No Change    Warm-up and Cool-down Performed as group-led instruction    Resistance Training Performed Yes    VAD Patient? No    PAD/SET Patient? No      Pain Assessment   Currently in Pain? No/denies    Multiple Pain Sites No           Capillary Blood Glucose: No results found for this or any previous visit (from the past 24 hour(s)).    Social History   Tobacco Use  Smoking Status Former Smoker  . Packs/day: 1.00  . Years: 20.00  . Pack years: 20.00  . Types: Cigarettes  . Start date: 01/18/1963  . Quit date: 08/11/1988  . Years since quitting: 31.7  Smokeless Tobacco Former Systems developer  . Types: Chew  . Quit date: 01/11/1989  Tobacco Comment   chewed 1 pack tobacco/day for 15 years    Goals Met:  Independence with exercise equipment Exercise tolerated well No report of cardiac concerns or symptoms Strength training completed today  Goals Unmet:  Not Applicable  Comments: check out 1545   Dr. Kathie Dike is Medical Director for [facility].

## 2020-04-30 ENCOUNTER — Encounter (HOSPITAL_COMMUNITY)
Admission: RE | Admit: 2020-04-30 | Discharge: 2020-04-30 | Disposition: A | Discharge status: Home / Self Care | Payer: Medicare Other | Source: Ambulatory Visit | Attending: Cardiology | Admitting: Cardiology

## 2020-04-30 ENCOUNTER — Other Ambulatory Visit: Payer: Self-pay

## 2020-04-30 DIAGNOSIS — Z952 Presence of prosthetic heart valve: Secondary | ICD-10-CM

## 2020-04-30 DIAGNOSIS — Z955 Presence of coronary angioplasty implant and graft: Secondary | ICD-10-CM | POA: Diagnosis not present

## 2020-04-30 NOTE — Progress Notes (Signed)
Daily Session Note  Patient Details  Name: [name] MRN: [id number] Date of Birth: 12-20-1940 Referring Provider:   Flowsheet Row CARDIAC REHAB PHASE II ORIENTATION from 02/14/2020 in [place]  Referring Provider Domenic Polite      Encounter Date: 04/30/2020  Check In:  Session Check In - 04/30/20 1445      Check-In   Supervising physician immediately available to respond to emergencies [facility] MD immediately available    Physician(s) Dr. Harl Bowie    Location [facility]    Staff Present Cathren Harsh, MS, Exercise Physiologist;Dalton Kris Mouton, MS, ACSM-CEP, Exercise Physiologist;Debra Wynetta Emery, RN, BSN    Virtual Visit No    Medication changes reported     No    Fall or balance concerns reported    No    Tobacco Cessation No Change    Warm-up and Cool-down Performed as group-led instruction    Resistance Training Performed Yes    VAD Patient? No    PAD/SET Patient? No      Pain Assessment   Currently in Pain? No/denies    Multiple Pain Sites No           Capillary Blood Glucose: No results found for this or any previous visit (from the past 24 hour(s)).    Social History   Tobacco Use  Smoking Status Former Smoker  . Packs/day: 1.00  . Years: 20.00  . Pack years: 20.00  . Types: Cigarettes  . Start date: 01/18/1963  . Quit date: 08/11/1988  . Years since quitting: 31.7  Smokeless Tobacco Former Systems developer  . Types: Chew  . Quit date: 01/11/1989  Tobacco Comment   chewed 1 pack tobacco/day for 15 years    Goals Met:  Independence with exercise equipment Exercise tolerated well No report of cardiac concerns or symptoms Strength training completed today  Goals Unmet:  Not Applicable  Comments: check out 1545   Dr. Kathie Dike is Medical Director for [facility].

## 2020-05-01 ENCOUNTER — Telehealth: Payer: Self-pay | Admitting: Cardiology

## 2020-05-01 DIAGNOSIS — M5416 Radiculopathy, lumbar region: Secondary | ICD-10-CM | POA: Diagnosis not present

## 2020-05-01 NOTE — Telephone Encounter (Signed)
Wife informed and verbalized understanding

## 2020-05-01 NOTE — Telephone Encounter (Signed)
New message    Patient is to have a MRI done on his neck , and they need to know if it is safe to do with the stents that he has placed?

## 2020-05-01 NOTE — Telephone Encounter (Signed)
Yes, reasonable to proceed with MRI.  Stent intervention was 6 months ago and there should have been time for this to endothelialize.

## 2020-05-02 ENCOUNTER — Encounter (HOSPITAL_COMMUNITY)
Admission: RE | Admit: 2020-05-02 | Discharge: 2020-05-02 | Disposition: A | Discharge status: Home / Self Care | Payer: Medicare Other | Source: Ambulatory Visit | Attending: Cardiology | Admitting: Cardiology

## 2020-05-02 ENCOUNTER — Other Ambulatory Visit: Payer: Self-pay

## 2020-05-02 DIAGNOSIS — Z955 Presence of coronary angioplasty implant and graft: Secondary | ICD-10-CM | POA: Diagnosis not present

## 2020-05-02 DIAGNOSIS — Z952 Presence of prosthetic heart valve: Secondary | ICD-10-CM

## 2020-05-02 NOTE — Progress Notes (Signed)
Daily Session Note  Patient Details  Name: [name] MRN: [id number] Date of Birth: May 13, 1940 Referring Provider:   Flowsheet Row CARDIAC REHAB PHASE II ORIENTATION from 02/14/2020 in [place]  Referring Provider Domenic Polite      Encounter Date: 05/02/2020  Check In:  Session Check In - 05/02/20 1445      Check-In   Supervising physician immediately available to respond to emergencies [facility] MD immediately available    Physician(s) Dr. Domenic Polite    Location [facility]    Staff Present Cathren Harsh, MS, Exercise Physiologist;Debra Wynetta Emery, RN, BSN    Virtual Visit No    Medication changes reported     No    Fall or balance concerns reported    No    Tobacco Cessation No Change    Warm-up and Cool-down Performed as group-led instruction    Resistance Training Performed Yes    VAD Patient? No    PAD/SET Patient? No      Pain Assessment   Currently in Pain? No/denies    Multiple Pain Sites No           Capillary Blood Glucose: No results found for this or any previous visit (from the past 24 hour(s)).    Social History   Tobacco Use  Smoking Status Former Smoker  . Packs/day: 1.00  . Years: 20.00  . Pack years: 20.00  . Types: Cigarettes  . Start date: 01/18/1963  . Quit date: 08/11/1988  . Years since quitting: 31.7  Smokeless Tobacco Former Systems developer  . Types: Chew  . Quit date: 01/11/1989  Tobacco Comment   chewed 1 pack tobacco/day for 15 years    Goals Met:  Independence with exercise equipment Exercise tolerated well No report of cardiac concerns or symptoms Strength training completed today  Goals Unmet:  Not Applicable  Comments: checkout 1545   Dr. Kathie Dike is Medical Director for [facility].

## 2020-05-05 ENCOUNTER — Encounter (HOSPITAL_COMMUNITY): Payer: Medicare Other

## 2020-05-07 ENCOUNTER — Other Ambulatory Visit: Payer: Self-pay

## 2020-05-07 ENCOUNTER — Encounter (HOSPITAL_COMMUNITY)
Admission: RE | Admit: 2020-05-07 | Discharge: 2020-05-07 | Disposition: A | Discharge status: Home / Self Care | Payer: Medicare Other | Source: Ambulatory Visit | Attending: Cardiology | Admitting: Cardiology

## 2020-05-07 VITALS — Wt 243.8 lb

## 2020-05-07 DIAGNOSIS — Z955 Presence of coronary angioplasty implant and graft: Secondary | ICD-10-CM | POA: Diagnosis not present

## 2020-05-07 DIAGNOSIS — Z952 Presence of prosthetic heart valve: Secondary | ICD-10-CM | POA: Diagnosis not present

## 2020-05-07 NOTE — Progress Notes (Signed)
Daily Session Note  Patient Details  Name: [name] MRN: [id number] Date of Birth: 18-Mar-1940 Referring Provider:   Flowsheet Row CARDIAC REHAB PHASE II ORIENTATION from 02/14/2020 in [place]  Referring Provider Domenic Polite      Encounter Date: 05/07/2020  Check In:  Session Check In - 05/07/20 1445      Check-In   Supervising physician immediately available to respond to emergencies [facility] MD immediately available    Physician(s) Dr. Domenic Polite    Location [facility]    Staff Present Aundra Dubin, RN, Bjorn Loser, MS, ACSM-CEP, Exercise Physiologist    Virtual Visit No    Medication changes reported     No    Fall or balance concerns reported    No    Tobacco Cessation No Change    Warm-up and Cool-down Performed as group-led instruction    Resistance Training Performed Yes    VAD Patient? No    PAD/SET Patient? No      Pain Assessment   Currently in Pain? No/denies    Multiple Pain Sites No           Capillary Blood Glucose: No results found for this or any previous visit (from the past 24 hour(s)).    Social History   Tobacco Use  Smoking Status Former Smoker  . Packs/day: 1.00  . Years: 20.00  . Pack years: 20.00  . Types: Cigarettes  . Start date: 01/18/1963  . Quit date: 08/11/1988  . Years since quitting: 31.7  Smokeless Tobacco Former Systems developer  . Types: Chew  . Quit date: 01/11/1989  Tobacco Comment   chewed 1 pack tobacco/day for 15 years    Goals Met:  Independence with exercise equipment Exercise tolerated well No report of cardiac concerns or symptoms Strength training completed today  Goals Unmet:  Not Applicable  Comments: Check out 1545.   Dr. Kathie Dike is Medical Director for [facility].

## 2020-05-09 ENCOUNTER — Encounter (HOSPITAL_COMMUNITY): Payer: Medicare Other

## 2020-05-13 DIAGNOSIS — H53483 Generalized contraction of visual field, bilateral: Secondary | ICD-10-CM | POA: Diagnosis not present

## 2020-05-15 DIAGNOSIS — M5416 Radiculopathy, lumbar region: Secondary | ICD-10-CM | POA: Diagnosis not present

## 2020-05-26 ENCOUNTER — Other Ambulatory Visit: Payer: Self-pay | Admitting: Physician Assistant

## 2020-05-27 NOTE — Progress Notes (Signed)
Discharge Progress Report  Patient Details  Name: [name] MRN: [id number] Date of Birth: 04-29-1940 Referring Provider:   Flowsheet Row CARDIAC REHAB PHASE II ORIENTATION from 02/14/2020 in [place]  Referring Provider Domenic Polite       Number of Visits: 56  Reason for Discharge:  Patient reached a stable level of exercise. Patient independent in their exercise. Patient has met program and personal goals.  Smoking History:  Social History   Tobacco Use  Smoking Status Former Smoker  . Packs/day: 1.00  . Years: 20.00  . Pack years: 20.00  . Types: Cigarettes  . Start date: 01/18/1963  . Quit date: 08/11/1988  . Years since quitting: 31.8  Smokeless Tobacco Former Systems developer  . Types: Chew  . Quit date: 01/11/1989  Tobacco Comment   chewed 1 pack tobacco/day for 15 years    Diagnosis:  Status post coronary artery stent placement  S/P TAVR (transcatheter aortic valve replacement)  ADL [facility]:   Initial Exercise Prescription:  Initial Exercise Prescription - 02/14/20 1400      Date of Initial Exercise RX and Referring Provider   Date 02/14/20    Referring Provider Domenic Polite    Expected Discharge Date 05/07/20      NuStep   Level 1    SPM 60    Minutes 22      Arm Ergometer   Level 1    RPM 40    Minutes 17      Prescription Details   Frequency (times per week) 3    Duration Progress to 30 minutes of continuous aerobic without signs/symptoms of physical distress      Intensity   THRR 40-80% of Max Heartrate 56-112    Ratings of Perceived Exertion 11-13      Progression   Progression Continue progressive overload as per policy without signs/symptoms or physical distress.      Resistance Training   Training Prescription Yes    Weight 2    Reps 10-15           Discharge Exercise Prescription (Final Exercise Prescription Changes):  Exercise Prescription Changes - 04/28/20 1500      Response to Exercise   Blood Pressure  (Admit) 160/56    Blood Pressure (Exercise) 146/66    Blood Pressure (Exit) 132/58    Heart Rate (Admit) 67 bpm    Heart Rate (Exercise) 109 bpm    Heart Rate (Exit) 72 bpm    Rating of Perceived Exertion (Exercise) 12    Duration Continue with 30 min of aerobic exercise without signs/symptoms of physical distress.    Intensity THRR unchanged      Progression   Progression Continue to progress workloads to maintain intensity without signs/symptoms of physical distress.      Resistance Training   Training Prescription Yes    Weight 3 lbs    Reps 10-15    Time 10 Minutes      NuStep   Level 3    SPM 68    Minutes 22    METs 1.9      Arm Ergometer   Level 3    RPM 55    Minutes 17    METs 2.4           Functional Capacity:  6 Minute Walk    Row Name 02/14/20 1407 05/07/20 1529       6 Minute Walk   Phase Initial Discharge    Distance 600 feet 700  feet    Distance Feet Change -- 100 ft    Walk Time 6 minutes 6 minutes    # of Rest Breaks 1  took a 2 minute break 1    MPH 1.1 1.33    METS 0.7 0.97    RPE 12 15    VO2 Peak 2.48 3.38    Symptoms Yes (comment) Yes (comment)    Comments back pain 5/10, and shortness of breath back pain 10/10. 1 seated rest break for 1 min due to SOB and back pain    Resting HR 62 bpm 74 bpm    Resting BP 146/62 144/62    Resting Oxygen Saturation  96 % 96 %    Exercise Oxygen Saturation  during 6 min walk 96 % 95 %    Max Ex. HR 82 bpm 94 bpm    Max Ex. BP 188/62 188/56    2 Minute Post BP 156/62 162/64           Psychological, QOL, Others - Outcomes: PHQ 2/9: Depression screen [facility] 2/9 05/27/2020 04/02/2020 02/14/2020 12/28/2019 10/01/2019  Decreased Interest 1 0 0 0 0  Down, Depressed, Hopeless 0 0 0 0 0  PHQ - 2 Score 1 0 0 0 0  Altered sleeping 1 - 0 - -  Tired, decreased energy 1 - 1 - -  Change in appetite 1 - 0 - -  Feeling bad or failure about yourself  0 - 0 - -  Trouble concentrating 0 - 0 - -  Moving slowly or  fidgety/restless 0 - 0 - -  Suicidal thoughts 0 - 0 - -  PHQ-9 Score 4 - 1 - -  Difficult doing work/chores Somewhat difficult - Not difficult at all - -  Some recent data might be hidden    Quality of Life:  Quality of Life - 05/27/20 0820      Quality of Life   Select Quality of Life      Quality of Life Scores   Health/Function Pre 24 %    Health/Function Post 22.29 %    Health/Function % Change -7.13 %    Socioeconomic Pre 25.64 %    Socioeconomic Post 24 %    Socioeconomic % Change  -6.4 %    Psych/Spiritual Pre 24 %    Psych/Spiritual Post 24 %    Psych/Spiritual % Change 0 %    Family Pre 21.6 %    Family Post 22.5 %    Family % Change 4.17 %    GLOBAL Pre 23.98 %    GLOBAL Post 23.03 %    GLOBAL % Change -3.96 %           Personal Goals: Goals established at orientation with interventions provided to work toward goal.  Personal Goals and Risk Factors at Admission - 02/14/20 1256      Core Components/Risk Factors/Patient Goals on Admission    Weight Management Yes;Weight Loss   He would like to lose 10 lbs while in the program   Intervention Weight Management: Develop a combined nutrition and exercise program designed to reach desired caloric intake, while maintaining appropriate intake of nutrient and fiber, sodium and fats, and appropriate energy expenditure required for the weight goal.;Weight Management: Provide education and appropriate resources to help participant work on and attain dietary goals.;Weight Management/Obesity: Establish reasonable short term and long term weight goals.;Obesity: Provide education and appropriate resources to help participant work on and attain dietary goals.  Expected Outcomes Short Term: Continue to assess and modify interventions until short term weight is achieved;Long Term: Adherence to nutrition and physical activity/exercise program aimed toward attainment of established weight goal;Weight Maintenance: Understanding of the  daily nutrition guidelines, which includes 25-35% calories from fat, 7% or less cal from saturated fats, less than 247m cholesterol, less than 1.5gm of sodium, & 5 or more servings of fruits and vegetables daily;Weight Loss: Understanding of general recommendations for a balanced deficit meal plan, which promotes 1-2 lb weight loss per week and includes a negative energy balance of [phone] kcal/d;Understanding recommendations for meals to include 15-35% energy as protein, 25-35% energy from fat, 35-60% energy from carbohydrates, less than 2082mof dietary cholesterol, 20-35 gm of total fiber daily;Understanding of distribution of calorie intake throughout the day with the consumption of 4-5 meals/snacks;Weight Gain: Understanding of general recommendations for a high calorie, high protein meal plan that promotes weight gain by distributing calorie intake throughout the day with the consumption for 4-5 meals, snacks, and/or supplements    Improve shortness of breath with ADL's Yes    Intervention Provide education, individualized exercise plan and daily activity instruction to help decrease symptoms of SOB with activities of daily living.    Expected Outcomes Short Term: Improve cardiorespiratory fitness to achieve a reduction of symptoms when performing ADLs;Long Term: Be able to perform more ADLs without symptoms or delay the onset of symptoms    Diabetes Yes    Intervention Provide education about signs/symptoms and action to take for hypo/hyperglycemia.;Provide education about proper nutrition, including hydration, and aerobic/resistive exercise prescription along with prescribed medications to achieve blood glucose in normal ranges: Fasting glucose 65-99 mg/dL    Expected Outcomes Short Term: Participant verbalizes understanding of the signs/symptoms and immediate care of hyper/hypoglycemia, proper foot care and importance of medication, aerobic/resistive exercise and nutrition plan for blood glucose  control.;Long Term: Attainment of HbA1C < 7%.    Hypertension Yes    Intervention Provide education on lifestyle modifcations including regular physical activity/exercise, weight management, moderate sodium restriction and increased consumption of fresh fruit, vegetables, and low fat dairy, alcohol moderation, and smoking cessation.;Monitor prescription use compliance.    Expected Outcomes Short Term: Continued assessment and intervention until BP is < 140/9081mG in hypertensive participants. < 130/15m3m in hypertensive participants with diabetes, heart failure or chronic kidney disease.;Long Term: Maintenance of blood pressure at goal levels.    Lipids Yes    Intervention Provide education and support for participant on nutrition & aerobic/resistive exercise along with prescribed medications to achieve LDL <70mg31mL >40mg.85mExpected Outcomes Short Term: Participant states understanding of desired cholesterol values and is compliant with medications prescribed. Participant is following exercise prescription and nutrition guidelines.;Long Term: Cholesterol controlled with medications as prescribed, with individualized exercise RX and with personalized nutrition plan. Value goals: LDL < 70mg, 29m> 40 mg.            Personal Goals Discharge:  Goals and Risk Factor Review    Row Name 02/18/20 0859 03/10/20 0953 04/07/20 0908 05/27/20 0830       Core Components/Risk Factors/Patient Goals Review   Personal Goals Review Weight Management/Obesity;Diabetes Diabetes;Weight Management/Obesity;Hypertension Diabetes;Weight Management/Obesity;Hypertension Diabetes;Weight Management/Obesity;Hypertension    Review Patient is new to the program starting today 02/18/20. He has multiple risk factors for CAD and is participating in cardiac rehab for risk modification. His personal goals for the program are to lose 10 lbs in the program and more long term and  to get stronger. Will continue to monitor as he works  toward meeting these goals. Patient has completed 10 sessions losing 1 lb since his initial visit. He is doing well in the program with consistent attendance. His most recent A1C on file was 11/30/19 at 8.2% which has trended upward over the past 2 years. His blood pressure is hypertensive and will continue to monitor this and contact his MD if this trend continues. His personal goals for the program are to lose 10 lbs and get stronger. We will continue to monitor as he works toward meeting these goals. Patient has completed 22 sessions gaining 1 lb since last 30 day review. He continues to do well in the program with progression and consistent attendance. He has a lipid panel and A1C on 04/03/19 at 7.1% which is trending down. His HDL was lower at 37 mg/dl but improved LDL. He does no always report his glucose readings but they are usually >150 mg/dl. He continues to eat out at [facility] most mornings. His personal goals are to lose 10 bls and get stronger. He still is not able to usually complete the weight warm up due to his back. Will continue to monitor as he works toward meeting these goals. Patient completed 31 sessions before discharge. He gained close to 9.5lbs since the start of the program. He had consistent attendance in the program. He did not seem too motivated to change his lifestyle habits or exercise habits while in the program. His personal goals were to lose 10 lbs and get stronger. He has gotten stronger thorughout the program, but has not reached his weight loss goal. His blood pressures still seem to be high at discharge. He is going to continue to work towards reaching his weight loss goal outside of rehab.    Expected Outcomes Patient will complete the program meeting both personal and program goals. Patient will complete the program meeting both personal and program goals. Patient will complete the program meeting both personal and program goals. --           Exercise Goals and  Review:  Exercise Goals    Row Name 02/14/20 1412 02/19/20 1027 03/17/20 1545 04/11/20 0944       Exercise Goals   Increase Physical Activity Yes Yes Yes Yes    Intervention -- Provide advice, education, support and counseling about physical activity/exercise needs.;Develop an individualized exercise prescription for aerobic and resistive training based on initial evaluation findings, risk stratification, comorbidities and participant's personal goals. Provide advice, education, support and counseling about physical activity/exercise needs.;Develop an individualized exercise prescription for aerobic and resistive training based on initial evaluation findings, risk stratification, comorbidities and participant's personal goals. Provide advice, education, support and counseling about physical activity/exercise needs.;Develop an individualized exercise prescription for aerobic and resistive training based on initial evaluation findings, risk stratification, comorbidities and participant's personal goals.    Expected Outcomes Long Term: Add in home exercise to make exercise part of routine and to increase amount of physical activity.;Short Term: Attend rehab on a regular basis to increase amount of physical activity.;Long Term: Exercising regularly at least 3-5 days a week. Long Term: Add in home exercise to make exercise part of routine and to increase amount of physical activity.;Short Term: Attend rehab on a regular basis to increase amount of physical activity.;Long Term: Exercising regularly at least 3-5 days a week. Long Term: Add in home exercise to make exercise part of routine and to increase amount of physical activity.;Short Term: Attend  rehab on a regular basis to increase amount of physical activity.;Long Term: Exercising regularly at least 3-5 days a week. Long Term: Add in home exercise to make exercise part of routine and to increase amount of physical activity.;Short Term: Attend rehab on a  regular basis to increase amount of physical activity.;Long Term: Exercising regularly at least 3-5 days a week.    Increase Strength and Stamina Yes Yes Yes Yes    Intervention -- Provide advice, education, support and counseling about physical activity/exercise needs.;Develop an individualized exercise prescription for aerobic and resistive training based on initial evaluation findings, risk stratification, comorbidities and participant's personal goals. Provide advice, education, support and counseling about physical activity/exercise needs.;Develop an individualized exercise prescription for aerobic and resistive training based on initial evaluation findings, risk stratification, comorbidities and participant's personal goals. Provide advice, education, support and counseling about physical activity/exercise needs.;Develop an individualized exercise prescription for aerobic and resistive training based on initial evaluation findings, risk stratification, comorbidities and participant's personal goals.    Expected Outcomes Short Term: Increase workloads from initial exercise prescription for resistance, speed, and METs.;Short Term: Perform resistance training exercises routinely during rehab and add in resistance training at home;Long Term: Improve cardiorespiratory fitness, muscular endurance and strength as measured by increased METs and functional capacity (6MWT) Short Term: Increase workloads from initial exercise prescription for resistance, speed, and METs.;Short Term: Perform resistance training exercises routinely during rehab and add in resistance training at home;Long Term: Improve cardiorespiratory fitness, muscular endurance and strength as measured by increased METs and functional capacity (6MWT) Short Term: Increase workloads from initial exercise prescription for resistance, speed, and METs.;Short Term: Perform resistance training exercises routinely during rehab and add in resistance training  at home;Long Term: Improve cardiorespiratory fitness, muscular endurance and strength as measured by increased METs and functional capacity (6MWT) Short Term: Increase workloads from initial exercise prescription for resistance, speed, and METs.;Short Term: Perform resistance training exercises routinely during rehab and add in resistance training at home;Long Term: Improve cardiorespiratory fitness, muscular endurance and strength as measured by increased METs and functional capacity (6MWT)    Able to understand and use rate of perceived exertion (RPE) scale Yes Yes Yes Yes    Intervention Provide education and explanation on how to use RPE scale Provide education and explanation on how to use RPE scale Provide education and explanation on how to use RPE scale Provide education and explanation on how to use RPE scale    Expected Outcomes Short Term: Able to use RPE daily in rehab to express subjective intensity level;Long Term:  Able to use RPE to guide intensity level when exercising independently Short Term: Able to use RPE daily in rehab to express subjective intensity level;Long Term:  Able to use RPE to guide intensity level when exercising independently Short Term: Able to use RPE daily in rehab to express subjective intensity level;Long Term:  Able to use RPE to guide intensity level when exercising independently Short Term: Able to use RPE daily in rehab to express subjective intensity level;Long Term:  Able to use RPE to guide intensity level when exercising independently    Knowledge and understanding of Target Heart Rate Range (THRR) Yes Yes Yes Yes    Intervention Provide education and explanation of THRR including how the numbers were predicted and where they are located for reference Provide education and explanation of THRR including how the numbers were predicted and where they are located for reference Provide education and explanation of THRR including how the numbers  were predicted and where  they are located for reference Provide education and explanation of THRR including how the numbers were predicted and where they are located for reference    Expected Outcomes Short Term: Able to state/look up THRR;Long Term: Able to use THRR to govern intensity when exercising independently;Short Term: Able to use daily as guideline for intensity in rehab Short Term: Able to state/look up THRR;Long Term: Able to use THRR to govern intensity when exercising independently;Short Term: Able to use daily as guideline for intensity in rehab Short Term: Able to state/look up THRR;Long Term: Able to use THRR to govern intensity when exercising independently;Short Term: Able to use daily as guideline for intensity in rehab Short Term: Able to state/look up THRR;Long Term: Able to use THRR to govern intensity when exercising independently;Short Term: Able to use daily as guideline for intensity in rehab    Able to check pulse independently Yes Yes Yes Yes    Intervention Review the importance of being able to check your own pulse for safety during independent exercise;Provide education and demonstration on how to check pulse in carotid and radial arteries. Review the importance of being able to check your own pulse for safety during independent exercise;Provide education and demonstration on how to check pulse in carotid and radial arteries. Review the importance of being able to check your own pulse for safety during independent exercise;Provide education and demonstration on how to check pulse in carotid and radial arteries. Review the importance of being able to check your own pulse for safety during independent exercise;Provide education and demonstration on how to check pulse in carotid and radial arteries.    Expected Outcomes Short Term: Able to explain why pulse checking is important during independent exercise;Long Term: Able to check pulse independently and accurately Short Term: Able to explain why pulse  checking is important during independent exercise;Long Term: Able to check pulse independently and accurately Short Term: Able to explain why pulse checking is important during independent exercise;Long Term: Able to check pulse independently and accurately Short Term: Able to explain why pulse checking is important during independent exercise;Long Term: Able to check pulse independently and accurately    Understanding of Exercise Prescription Yes Yes Yes Yes    Intervention Provide education, explanation, and written materials on patient's individual exercise prescription Provide education, explanation, and written materials on patient's individual exercise prescription Provide education, explanation, and written materials on patient's individual exercise prescription Provide education, explanation, and written materials on patient's individual exercise prescription    Expected Outcomes Short Term: Able to explain program exercise prescription;Long Term: Able to explain home exercise prescription to exercise independently Short Term: Able to explain program exercise prescription;Long Term: Able to explain home exercise prescription to exercise independently Short Term: Able to explain program exercise prescription;Long Term: Able to explain home exercise prescription to exercise independently Short Term: Able to explain program exercise prescription;Long Term: Able to explain home exercise prescription to exercise independently           Exercise Goals Re-Evaluation:  Exercise Goals Re-Evaluation    Row Name 02/19/20 1027 03/18/20 1013 04/11/20 1545 05/27/20 0824       Exercise Goal Re-Evaluation   Exercise Goals Review Increase Physical Activity;Increase Strength and Stamina;Able to understand and use rate of perceived exertion (RPE) scale;Knowledge and understanding of Target Heart Rate Range (THRR);Able to check pulse independently;Understanding of Exercise Prescription Increase Physical  Activity;Increase Strength and Stamina;Able to understand and use rate of perceived exertion (RPE) scale;Knowledge  and understanding of Target Heart Rate Range (THRR);Able to check pulse independently;Understanding of Exercise Prescription Increase Physical Activity;Increase Strength and Stamina;Able to understand and use rate of perceived exertion (RPE) scale;Knowledge and understanding of Target Heart Rate Range (THRR);Able to check pulse independently;Understanding of Exercise Prescription Increase Physical Activity;Increase Strength and Stamina;Able to understand and use rate of perceived exertion (RPE) scale;Knowledge and understanding of Target Heart Rate Range (THRR);Able to check pulse independently;Understanding of Exercise Prescription    Comments Patient has completed 1 exercise session. He tolerated exercise well with no complaints. He had a positive attitude about coming to rehab. He is currently exercising at 1.8 METs on the NuStep. Will continue to monitor and progress as able. Patient has completed 13 exercise sessions. He has tolerated exercise well. He has complained of back pain that prevents him from completing the warm up and strength training part. He is progressing slowly. He is currently exercising at 1.9 METs on the NuStep. Will continue to monitor and progress as able. patient has completed 24 exercise sessions. He has tolerated exercise well. He has been increasing intensities slowly and progressing slowly. He complains of back pain most days and the back pain prevents him from completing the resistance training. His systolic blood pressure is elevated at rest and during exercise. His blood pressure does not come down much after exercise. He is currently exercising at 2.2 METs in the Arm ergometer. Will continue to monitor and progress as able. Patient has completed 31 sessions before discharge. He tolerated exercise well throughout the program. He progressed very slowly. He complained of  severe back pain that prevented him from completing the entire session. He had to take frequent breaks due to this back pain. He was hypertensive throughout his time at rehab. He ended exercising at 2.4 METs on the Arm ergometer.    Expected Outcomes Through exercise at rehab and with a home exercise program, patient will reach their goals. Through exercise at rehab and with a home exercise program, patient will reach their goals. Through exercise at rehab and with a home exercise program, patient will reach their goals. --           Nutrition & Weight - Outcomes:  Pre Biometrics - 04/28/20 1552      Pre Biometrics   Weight 108.6 kg           Post Biometrics - 05/07/20 1530       Post  Biometrics   Weight 110.6 kg    Waist Circumference 51.5 inches    Hip Circumference 47 inches    Waist to Hip Ratio 1.1 %    Triceps Skinfold 10 mm    % Body Fat 36.5 %    Grip Strength 23.9 kg    Flexibility 0 in    Single Leg Stand 1 seconds           Nutrition:  Nutrition Therapy & Goals - 03/10/20 0950      Personal Nutrition Goals   Comments We will continue to provide heart healthy nutritional education through handouts.      Intervention Plan   Intervention Nutrition handout(s) given to patient.           Nutrition Discharge:  Nutrition Assessments - 05/27/20 0822      MEDFICTS Scores   Pre Score 69    Post Score 39    Score Difference -30           Education Questionnaire Score:  Knowledge Questionnaire Score -  05/27/20 7023      Knowledge Questionnaire Score   Pre Score 20/24    Post Score 23/24          Patient graduated from [facility] today on 05-07-20 after completing 31 sessions. They achieved LTG of 30 minutes of aerobic exercise at Max Met level of 2.4. All patients vitals are WNL. Discharge instruction has been reviewed in detail and patient stated an understanding of material given. Patient plans to exercise at home. Cardiac  Rehab staff will make f/u call. Patient had no complaints of any abnormal S/S or pain on their exit visit.     Goals reviewed with patient; copy given to patient.

## 2020-05-27 NOTE — Addendum Note (Signed)
Encounter addended by: Lorin Glass on: 05/27/2020 8:37 AM  Actions taken: Flowsheet data copied forward, Flowsheet accepted, Clinical Note Signed, Episode resolved

## 2020-06-08 ENCOUNTER — Other Ambulatory Visit: Payer: Self-pay | Admitting: Physician Assistant

## 2020-06-10 NOTE — Telephone Encounter (Signed)
Pt's pharmacy is requesting a refill on clopidogrel. Pt is supposed to stop taking this medication on 06/02/20, per Angelena Form, PA. Does Dr. Angelena Form want to D/C this medication? Please address

## 2020-06-11 NOTE — Telephone Encounter (Signed)
Last ov note w Dr. Domenic Polite:  2.  CAD status post BMS to nondominant RCA in 2006 as well as more recently DES x2 to the mid LAD in November 2021 prior to TAVR.  No active angina symptoms.  Continue DAPT for at least 6 months.  Otherwise on Norvasc, Lipitor, and Imdur.  From TAVR perspective Plavix can be stopped but had intervention w DES x2 just prior to TAVR.  Will route to Dr. Domenic Polite to determine if Plavix should be refilled.

## 2020-06-12 ENCOUNTER — Telehealth: Payer: Self-pay | Admitting: *Deleted

## 2020-06-12 NOTE — Telephone Encounter (Signed)
plavix mistakenly sent to [facility] today via refill request - per recent notes on refill encounter pt was to stop Plavix 06/02/20 - LM for pt to return call and also spoke with [facility] to d/c plavix off pt profile

## 2020-06-12 NOTE — Telephone Encounter (Signed)
Yes, per recommendations from the valve clinic, plan was 6 months total treatment of DAPT following PCI and TAVR which would end at this point.

## 2020-06-20 ENCOUNTER — Ambulatory Visit (INDEPENDENT_AMBULATORY_CARE_PROVIDER_SITE_OTHER): Payer: Medicare Other | Admitting: Nurse Practitioner

## 2020-06-20 DIAGNOSIS — J0110 Acute frontal sinusitis, unspecified: Secondary | ICD-10-CM

## 2020-06-20 MED ORDER — FLUCONAZOLE 150 MG PO TABS: 150.0000 mg | ORAL_TABLET | Freq: Once | ORAL | 0 refills | Status: AC

## 2020-06-20 MED ORDER — AMOXICILLIN-POT CLAVULANATE 875-125 MG PO TABS: 1.0000 | ORAL_TABLET | Freq: Two times a day (BID) | ORAL | 0 refills | Status: DC

## 2020-06-20 NOTE — Progress Notes (Signed)
if  Virtual Visit  Note Due to COVID-19 pandemic this visit was conducted virtually. This visit type was conducted due to national recommendations for restrictions regarding the COVID-19 Pandemic (e.g. social distancing, sheltering in place) in an effort to limit this patient's exposure and mitigate transmission in our community. All issues noted in this document were discussed and addressed.  A physical exam was not performed with this format.  I connected with [name] on 06/22/20 at 3 PM by telephone and verified that I am speaking with the correct person using two identifiers. [name] is currently located at home during visit. The provider, Ivy Lynn, NP is located in their office at time of visit.  I discussed the limitations, risks, security and privacy concerns of performing an evaluation and management service by telephone and the availability of in person appointments. I also discussed with the patient that there may be a patient responsible charge related to this service. The patient expressed understanding and agreed to proceed.   History and Present Illness:  Sinusitis This is a recurrent problem. The problem is unchanged. There has been no fever. Associated symptoms include congestion, ear pain, headaches, a sore throat and swollen glands. Pertinent negatives include no chills. Past treatments include nothing.     Review of Systems  Constitutional:  Negative for chills.  HENT:  Positive for congestion, ear pain and sore throat.   Neurological:  Positive for headaches.  All other systems reviewed and are negative.   Observations/Objective: Televisit patient did not sound to be in distress.   Assessment and Plan: Take medication as prescribed Augmentin 875-125 mg tablet by mouth twice daily. Increase hydration, Tylenol for pain,  Rx sent to pharmacy.   Follow Up Instructions:  Patient knows to follow-up with unresolved symptoms.    I  discussed the assessment and treatment plan with the patient. The patient was provided an opportunity to ask questions and all were answered. The patient agreed with the plan and demonstrated an understanding of the instructions.   The patient was advised to call back or seek an in-person evaluation if the symptoms worsen or if the condition fails to improve as anticipated.  The above assessment and management plan was discussed with the patient. The patient verbalized understanding of and has agreed to the management plan. Patient is aware to call the clinic if symptoms persist or worsen. Patient is aware when to return to the clinic for a follow-up visit. Patient educated on when it is appropriate to go to the emergency department.   Time call ended: 3:10 PM  I provided 10 minutes of  non face-to-face time during this encounter.    Ivy Lynn, NP

## 2020-06-22 NOTE — Assessment & Plan Note (Signed)
Take medication as prescribed Augmentin 875-125 mg tablet by mouth twice daily. Increase hydration, Tylenol for pain,

## 2020-07-01 ENCOUNTER — Ambulatory Visit (INDEPENDENT_AMBULATORY_CARE_PROVIDER_SITE_OTHER): Payer: Medicare Other | Admitting: Family

## 2020-07-01 ENCOUNTER — Inpatient Hospital Stay (HOSPITAL_COMMUNITY)
Admission: EM | Admit: 2020-07-01 | Discharge: 2020-07-07 | DRG: 075 | Disposition: A | Discharge status: Home Health | Payer: Medicare Other | Attending: Family Medicine | Admitting: Family Medicine

## 2020-07-01 ENCOUNTER — Emergency Department (HOSPITAL_COMMUNITY): Payer: Medicare Other

## 2020-07-01 ENCOUNTER — Other Ambulatory Visit: Payer: Self-pay

## 2020-07-01 ENCOUNTER — Encounter: Payer: Self-pay | Admitting: Family

## 2020-07-01 ENCOUNTER — Encounter (HOSPITAL_COMMUNITY): Payer: Self-pay

## 2020-07-01 DIAGNOSIS — I252 Old myocardial infarction: Secondary | ICD-10-CM

## 2020-07-01 DIAGNOSIS — Z7982 Long term (current) use of aspirin: Secondary | ICD-10-CM

## 2020-07-01 DIAGNOSIS — Z20822 Contact with and (suspected) exposure to covid-19: Secondary | ICD-10-CM | POA: Diagnosis present

## 2020-07-01 DIAGNOSIS — E1165 Type 2 diabetes mellitus with hyperglycemia: Secondary | ICD-10-CM | POA: Diagnosis present

## 2020-07-01 DIAGNOSIS — E119 Type 2 diabetes mellitus without complications: Secondary | ICD-10-CM | POA: Diagnosis not present

## 2020-07-01 DIAGNOSIS — N400 Enlarged prostate without lower urinary tract symptoms: Secondary | ICD-10-CM | POA: Diagnosis present

## 2020-07-01 DIAGNOSIS — I712 Thoracic aortic aneurysm, without rupture, unspecified: Secondary | ICD-10-CM

## 2020-07-01 DIAGNOSIS — D72829 Elevated white blood cell count, unspecified: Secondary | ICD-10-CM | POA: Diagnosis not present

## 2020-07-01 DIAGNOSIS — E860 Dehydration: Secondary | ICD-10-CM | POA: Diagnosis not present

## 2020-07-01 DIAGNOSIS — Z794 Long term (current) use of insulin: Secondary | ICD-10-CM

## 2020-07-01 DIAGNOSIS — G9341 Metabolic encephalopathy: Secondary | ICD-10-CM | POA: Diagnosis present

## 2020-07-01 DIAGNOSIS — I10 Essential (primary) hypertension: Secondary | ICD-10-CM | POA: Diagnosis not present

## 2020-07-01 DIAGNOSIS — K219 Gastro-esophageal reflux disease without esophagitis: Secondary | ICD-10-CM | POA: Diagnosis present

## 2020-07-01 DIAGNOSIS — I7121 Aneurysm of the ascending aorta, without rupture: Secondary | ICD-10-CM

## 2020-07-01 DIAGNOSIS — R0602 Shortness of breath: Secondary | ICD-10-CM | POA: Diagnosis not present

## 2020-07-01 DIAGNOSIS — F0390 Unspecified dementia without behavioral disturbance: Secondary | ICD-10-CM

## 2020-07-01 DIAGNOSIS — Z803 Family history of malignant neoplasm of breast: Secondary | ICD-10-CM

## 2020-07-01 DIAGNOSIS — R4182 Altered mental status, unspecified: Secondary | ICD-10-CM

## 2020-07-01 DIAGNOSIS — R509 Fever, unspecified: Secondary | ICD-10-CM

## 2020-07-01 DIAGNOSIS — Z96653 Presence of artificial knee joint, bilateral: Secondary | ICD-10-CM | POA: Diagnosis present

## 2020-07-01 DIAGNOSIS — R531 Weakness: Secondary | ICD-10-CM | POA: Diagnosis not present

## 2020-07-01 DIAGNOSIS — Z8719 Personal history of other diseases of the digestive system: Secondary | ICD-10-CM | POA: Diagnosis not present

## 2020-07-01 DIAGNOSIS — I517 Cardiomegaly: Secondary | ICD-10-CM | POA: Diagnosis not present

## 2020-07-01 DIAGNOSIS — E1159 Type 2 diabetes mellitus with other circulatory complications: Secondary | ICD-10-CM | POA: Diagnosis not present

## 2020-07-01 DIAGNOSIS — Z87891 Personal history of nicotine dependence: Secondary | ICD-10-CM

## 2020-07-01 DIAGNOSIS — A879 Viral meningitis, unspecified: Secondary | ICD-10-CM | POA: Diagnosis not present

## 2020-07-01 DIAGNOSIS — I2510 Atherosclerotic heart disease of native coronary artery without angina pectoris: Secondary | ICD-10-CM | POA: Diagnosis present

## 2020-07-01 DIAGNOSIS — G4733 Obstructive sleep apnea (adult) (pediatric): Secondary | ICD-10-CM | POA: Diagnosis not present

## 2020-07-01 DIAGNOSIS — E871 Hypo-osmolality and hyponatremia: Secondary | ICD-10-CM | POA: Diagnosis not present

## 2020-07-01 DIAGNOSIS — Z7984 Long term (current) use of oral hypoglycemic drugs: Secondary | ICD-10-CM

## 2020-07-01 DIAGNOSIS — Z1611 Resistance to penicillins: Secondary | ICD-10-CM | POA: Diagnosis not present

## 2020-07-01 DIAGNOSIS — Z955 Presence of coronary angioplasty implant and graft: Secondary | ICD-10-CM | POA: Diagnosis not present

## 2020-07-01 DIAGNOSIS — G9340 Encephalopathy, unspecified: Secondary | ICD-10-CM | POA: Diagnosis present

## 2020-07-01 DIAGNOSIS — Z8052 Family history of malignant neoplasm of bladder: Secondary | ICD-10-CM

## 2020-07-01 DIAGNOSIS — E785 Hyperlipidemia, unspecified: Secondary | ICD-10-CM | POA: Diagnosis not present

## 2020-07-01 DIAGNOSIS — Z743 Need for continuous supervision: Secondary | ICD-10-CM | POA: Diagnosis not present

## 2020-07-01 DIAGNOSIS — Z808 Family history of malignant neoplasm of other organs or systems: Secondary | ICD-10-CM

## 2020-07-01 DIAGNOSIS — E1169 Type 2 diabetes mellitus with other specified complication: Secondary | ICD-10-CM | POA: Diagnosis not present

## 2020-07-01 DIAGNOSIS — N390 Urinary tract infection, site not specified: Secondary | ICD-10-CM | POA: Diagnosis not present

## 2020-07-01 DIAGNOSIS — Z952 Presence of prosthetic heart valve: Secondary | ICD-10-CM | POA: Diagnosis not present

## 2020-07-01 DIAGNOSIS — Z87442 Personal history of urinary calculi: Secondary | ICD-10-CM

## 2020-07-01 DIAGNOSIS — R519 Headache, unspecified: Secondary | ICD-10-CM | POA: Diagnosis not present

## 2020-07-01 DIAGNOSIS — R112 Nausea with vomiting, unspecified: Secondary | ICD-10-CM

## 2020-07-01 DIAGNOSIS — I152 Hypertension secondary to endocrine disorders: Secondary | ICD-10-CM | POA: Diagnosis present

## 2020-07-01 DIAGNOSIS — Z8249 Family history of ischemic heart disease and other diseases of the circulatory system: Secondary | ICD-10-CM

## 2020-07-01 DIAGNOSIS — Z8661 Personal history of infections of the central nervous system: Secondary | ICD-10-CM | POA: Diagnosis not present

## 2020-07-01 DIAGNOSIS — R6889 Other general symptoms and signs: Secondary | ICD-10-CM | POA: Diagnosis not present

## 2020-07-01 DIAGNOSIS — R0689 Other abnormalities of breathing: Secondary | ICD-10-CM | POA: Diagnosis not present

## 2020-07-01 DIAGNOSIS — G4489 Other headache syndrome: Secondary | ICD-10-CM | POA: Diagnosis not present

## 2020-07-01 DIAGNOSIS — Z96643 Presence of artificial hip joint, bilateral: Secondary | ICD-10-CM | POA: Diagnosis present

## 2020-07-01 DIAGNOSIS — Z800 Family history of malignant neoplasm of digestive organs: Secondary | ICD-10-CM

## 2020-07-01 DIAGNOSIS — Z79899 Other long term (current) drug therapy: Secondary | ICD-10-CM

## 2020-07-01 LAB — CBC WITH DIFFERENTIAL/PLATELET
Abs Immature Granulocytes: 0.05 10*3/uL (ref 0.00–0.07)
Basophils Absolute: 0.1 10*3/uL (ref 0.0–0.1)
Basophils Relative: 1 %
Eosinophils Absolute: 0.1 10*3/uL (ref 0.0–0.5)
Eosinophils Relative: 1 %
HCT: 40.9 % (ref 39.0–52.0)
Hemoglobin: 12.9 g/dL — ABNORMAL LOW (ref 13.0–17.0)
Immature Granulocytes: 1 %
Lymphocytes Relative: 14 %
Lymphs Abs: 1.5 10*3/uL (ref 0.7–4.0)
MCH: 27.9 pg (ref 26.0–34.0)
MCHC: 31.5 g/dL (ref 30.0–36.0)
MCV: 88.3 fL (ref 80.0–100.0)
Monocytes Absolute: 0.6 10*3/uL (ref 0.1–1.0)
Monocytes Relative: 5 %
Neutro Abs: 8.8 10*3/uL — ABNORMAL HIGH (ref 1.7–7.7)
Neutrophils Relative %: 78 %
Platelets: 333 10*3/uL (ref 150–400)
RBC: 4.63 MIL/uL (ref 4.22–5.81)
RDW: 15.1 % (ref 11.5–15.5)
WBC: 11.1 10*3/uL — ABNORMAL HIGH (ref 4.0–10.5)
nRBC: 0 % (ref 0.0–0.2)

## 2020-07-01 LAB — RESP PANEL BY RT-PCR (FLU A&B, COVID) ARPGX2
Influenza A by PCR: NEGATIVE
Influenza B by PCR: NEGATIVE
SARS Coronavirus 2 by RT PCR: NEGATIVE

## 2020-07-01 LAB — URINALYSIS, ROUTINE W REFLEX MICROSCOPIC
Bilirubin Urine: NEGATIVE
Glucose, UA: >=500 mg/dL — AB
Ketones, ur: 20 mg/dL — AB
Leukocytes,Ua: NEGATIVE
Nitrite: NEGATIVE
Protein, ur: 100 mg/dL — AB
RBC / HPF: >50 RBC/hpf — ABNORMAL HIGH (ref 0–5)
Specific Gravity, Urine: 1.02 (ref 1.005–1.030)
pH: 7 (ref 5.0–8.0)

## 2020-07-01 LAB — TROPONIN I (HIGH SENSITIVITY)
Troponin I (High Sensitivity): 8 ng/L (ref <18)
Troponin I (High Sensitivity): 10 ng/L (ref <18)

## 2020-07-01 LAB — BRAIN NATRIURETIC PEPTIDE: B Natriuretic Peptide: 51 pg/mL (ref 0.0–100.0)

## 2020-07-01 LAB — BASIC METABOLIC PANEL
Anion gap: 10 (ref 5–15)
BUN: 13 mg/dL (ref 8–23)
CO2: 23 mmol/L (ref 22–32)
Calcium: 8.3 mg/dL — ABNORMAL LOW (ref 8.9–10.3)
Chloride: 99 mmol/L (ref 98–111)
Creatinine, Ser: 0.89 mg/dL (ref 0.61–1.24)
GFR, Estimated: >60 mL/min (ref >60)
Glucose, Bld: 228 mg/dL — ABNORMAL HIGH (ref 70–99)
Potassium: 3.9 mmol/L (ref 3.5–5.1)
Sodium: 132 mmol/L — ABNORMAL LOW (ref 135–145)

## 2020-07-01 LAB — LACTIC ACID, PLASMA
Lactic Acid, Venous: 1.4 mmol/L (ref 0.5–1.9)
Lactic Acid, Venous: 1.7 mmol/L (ref 0.5–1.9)

## 2020-07-01 MED ORDER — ENOXAPARIN SODIUM 60 MG/0.6ML IJ SOSY
50.0000 mg | PREFILLED_SYRINGE | INTRAMUSCULAR | Status: DC
  Administered 2020-07-01: 50 mg via SUBCUTANEOUS
  Filled 2020-07-01: qty 0.6

## 2020-07-01 MED ORDER — ACETAMINOPHEN 500 MG PO TABS
1000.0000 mg | ORAL_TABLET | Freq: Once | ORAL | Status: AC
  Administered 2020-07-01: 1000 mg via ORAL
  Filled 2020-07-01: qty 2

## 2020-07-01 MED ORDER — SODIUM CHLORIDE 0.9 % IV BOLUS
500.0000 mL | Freq: Once | INTRAVENOUS | Status: AC
  Administered 2020-07-01: 500 mL via INTRAVENOUS

## 2020-07-01 MED ORDER — IOHEXOL 300 MG/ML  SOLN
100.0000 mL | Freq: Once | INTRAMUSCULAR | Status: AC | PRN
  Administered 2020-07-01: 100 mL via INTRAVENOUS

## 2020-07-01 MED ORDER — IBUPROFEN 800 MG PO TABS
800.0000 mg | ORAL_TABLET | Freq: Once | ORAL | Status: AC
  Administered 2020-07-01: 800 mg via ORAL
  Filled 2020-07-01: qty 1

## 2020-07-01 MED ORDER — SODIUM CHLORIDE 0.9 % IV BOLUS
1000.0000 mL | Freq: Once | INTRAVENOUS | Status: AC
  Administered 2020-07-01: 1000 mL via INTRAVENOUS

## 2020-07-01 NOTE — ED Notes (Signed)
Patient state that he is too tired to ambulate. ED provider aware.

## 2020-07-01 NOTE — ED Notes (Signed)
MD in room at time of triage for MSE. 

## 2020-07-01 NOTE — ED Provider Notes (Signed)
Unable to get a hold of hospitalist.  Patient having mild headache and reassessment, Tylenol ordered.  IV fluid bolus ordered.  Tick testing pending.  Reassessment patient has no meningismus, no hallucinations or confusion at this time.  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  Discussed detail recent illness and patient has had symptoms for 2 weeks off and on.  Patient does have cough, had a fever at home, has chronic headaches as well.  Patient symptoms are most consistent with viral process.  We discussed if he worsened we could perform lumbar puncture, at this time they wish to hold.  With him being generally weak, elderly plan for admission.  Repaged hospitalist.  Hospitalist accepted.  Golda Acre, MD 07/01/20 2047

## 2020-07-01 NOTE — ED Provider Notes (Signed)
Patient care signed out to follow-up CT scan results and reassess.  Patient presents with fatigue, vomiting and general weakness since yesterday.  Recent exposure to people with COVID while hunting.  Patient has had taxis pulled off in the past but nothing recently, no rashes.  No neck stiffness.  Mild headache.  Exam wise no focal abdominal tenderness, no respiratory difficulty, oxygen 90% in the room.  Patient unable to ambulate due to generalized weakness, nausea.  Repeat IV fluids ordered.  COVID test negative, CT scan showed known aneurysm and reminded patient and family to have close follow-up for repeat testing outpatient.  With patient's generalized weakness, fever, nausea plan for observation overnight.  Paged hospitalist. Patient has allergy to doxycycline, [facility] spotted fever and Lyme disease testing ordered.   Elnora Morrison, MD 07/01/20 1745

## 2020-07-01 NOTE — ED Notes (Signed)
Patient to CT at this time

## 2020-07-01 NOTE — Discharge Instructions (Addendum)
  Follow up [place] spotted fever results with primary doctor. If you were given medicines take as directed.  If you are on coumadin or contraceptives realize their levels and effectiveness is altered by many different medicines.  If you have any reaction (rash, tongues swelling, other) to the medicines stop taking and see a physician.    If your blood pressure was elevated in the ER make sure you follow up for management with a primary doctor or return for chest pain, shortness of breath or stroke symptoms.  Please follow up as directed and return to the ER or see a physician for new or worsening symptoms.  Thank you.

## 2020-07-01 NOTE — ED Notes (Signed)
Patient sitting on side of bed having pulled IV out stating he just wants to get up. Wife concerned stating that he is having intermittent confusion similar to when he was diagnosed with Meningitis in 2019. Patient complaining of severe headache with neck pain. Refusing to lay back in bed at this time. Oriented to all questions this nurse asked patient. Dr. Reather Converse made aware and in room to assess patient.

## 2020-07-01 NOTE — Progress Notes (Signed)
Virtual Visit  Note Due to COVID-19 pandemic this visit was conducted virtually. This visit type was conducted due to national recommendations for restrictions regarding the COVID-19 Pandemic (e.g. social distancing, sheltering in place) in an effort to limit this patient's exposure and mitigate transmission in our community. All issues noted in this document were discussed and addressed.  A physical exam was not performed with this format.  I connected with [name] on 07/01/20 at 9:58 AM  by telephone and verified that I am speaking with the correct person using two identifiers. [name] is currently located at home and no one is currently with his wife  during visit. The provider, Evelina Dun, FNP is located in their office at time of visit.  I discussed the limitations, risks, security and privacy concerns of performing an evaluation and management service by telephone and the availability of in person appointments. I also discussed with the patient that there may be a patient responsible charge related to this service. The patient expressed understanding and agreed to proceed.   History and Present Illness:  Pt calls the office today with complaints of fever that started yesterday. He started throwing up this morning. He was treated for a sinus infection on 06/20/20 and completed Augmentin. He as feeling better and his symptoms resolved and his fever started yesterday.  He has had three negative tests since then.  Emesis  This is a new problem. Associated symptoms include coughing, a fever and headaches.  Fever  This is a new problem. The current episode started yesterday. The problem occurs constantly. The maximum temperature noted was 102 to 102.9 F. Associated symptoms include coughing, headaches, muscle aches, nausea, sleepiness, a sore throat and vomiting. Pertinent negatives include no congestion or ear pain. He has tried acetaminophen, NSAIDs and fluids for the  symptoms. The treatment provided mild relief.     Review of Systems  Constitutional:  Positive for fever.  HENT:  Positive for sore throat. Negative for congestion and ear pain.   Respiratory:  Positive for cough.   Gastrointestinal:  Positive for nausea and vomiting.  Neurological:  Positive for headaches.    Observations/Objective: Patient laying in bed, weakness noted  Assessment and Plan: 1. Fever, unspecified fever cause - Rapid Strep Screen ([facility] ONLY) - Veritor Flu A/B Waived - Novel Coronavirus, NAA ([facility]) - DG Chest 2 View; Future  2. Nausea and vomiting, intractability of vomiting not specified, unspecified vomiting type - Rapid Strep Screen ([facility] ONLY) - Veritor Flu A/B Waived - Novel Coronavirus, NAA ([facility]) - DG Chest 2 View; Future   Force fluids Tests pending Will come for chest xray today If symptoms worsen or do not improve needs to be seen in face to face. If SOB worsens go to ED.    I discussed the assessment and treatment plan with the patient. The patient was provided an opportunity to ask questions and all were answered. The patient agreed with the plan and demonstrated an understanding of the instructions.   The patient was advised to call back or seek an in-person evaluation if the symptoms worsen or if the condition fails to improve as anticipated.  The above assessment and management plan was discussed with the patient. The patient verbalized understanding of and has agreed to the management plan. Patient is aware to call the clinic if symptoms persist or worsen. Patient is aware when to return to the clinic for a follow-up visit. Patient educated on  when it is appropriate to go to the emergency department.   Time call ended: 10:20 AM    I provided 22 minutes of  non face-to-face time during this encounter.    Evelina Dun, FNP

## 2020-07-01 NOTE — ED Triage Notes (Signed)
Patient complaining of nausea/vomiting, chills, headache and fever which started last night/this am. Recently traveled out of [place]. Recently treated with ABT for sinus infection. Sats 89% upon EMS arrival.

## 2020-07-01 NOTE — ED Provider Notes (Signed)
[facility] EMERGENCY DEPARTMENT Provider Note   CSN: [id number] Arrival date & time: 07/01/20  1223     History Chief Complaint  Patient presents with   Fever    [name] is a 80 y.o. male.  Pt presents to the ED today with fever, chills, n/v, and headache.  Pt went on a hunting trip to [place] and returned a week ago.  He was treated for a sinus infection with Augmentin on 6/10 and has finished the treatment.  He has taken several home Covid tests.  They have been negative.  However, he just found out that all his friends he went with on the trip were positive for Covid.  He's been fully vaccinated + booster.  He did have a virtual visit with his pcp who told him to call EMS if sx worsened.  His sx worsened so he did.  He was 89% on RA.  EMS put him on 2L and O2 sat is now in the 90s.  He was given 4 mg zofran and n/v improved.  Fever of 102 today.  He took tylenol last at 1000.  He thinks he kept it down.      Past Medical History:  Diagnosis Date   Anxiety    Aortic atherosclerosis ([place]) 12/19/2017   Aortic stenosis    26 mm Edwards S3U THV November 2021   Arthritis    Bursitis of hip    Cervical spondylosis    Chronic headaches    Coronary atherosclerosis of native coronary artery    BMS nondominant RCA 12/2004, DES x2 to mid LAD 11/2019   Essential hypertension    GERD (gastroesophageal reflux disease)    History of adenomatous polyp of colon    History of kidney stones    History of MI (myocardial infarction) 12/2004   History of viral meningitis 02/28/2018   December 2019   Hyperlipidemia    Internal hemorrhoids    Low back pain    Nocturia more than twice per night    OSA (obstructive sleep apnea)    Spinal stenosis    Type 2 diabetes mellitus treated with insulin ([place])    Wears dentures    Upper    Patient Active Problem List   Diagnosis Date Noted   Subacute frontal sinusitis 06/20/2020   Severe aortic stenosis 12/04/2019   Unstable angina  (HCC)    Nonrheumatic aortic valve stenosis    OAB (overactive bladder) 11/29/2018   Aftercare 09/26/2018   OA (osteoarthritis) of hip 07/05/2018   History of viral meningitis 02/28/2018   Aortic atherosclerosis ([place]) 12/19/2017   Word finding difficulty 12/14/2017   Pain of left hip joint 11/30/2017   History of total hip arthroplasty 10/12/2017   Trochanteric bursitis 10/12/2017   BPH (benign prostatic hyperplasia) 07/29/2017   Cervical spondylosis 07/21/2017   Lumbar spinal stenosis 02/25/2017   Spinal stenosis of lumbar region 02/24/2017   Spinal stenosis at L4-L5 level 02/24/2017   Lumbar radicular pain 01/17/2017   Lumbar pain 01/17/2017   Microalbuminuria due to type 2 diabetes mellitus ([place]) 07/23/2016   Common migraine 12/23/2014   Hx of adenomatous colonic polyps 05/09/2014   Obesity (BMI 30-39.9) 07/30/2013   Type 2 diabetes mellitus with circulatory disorder ([place]) 05/27/2011   Hyperlipidemia associated with type 2 diabetes mellitus ([place]) 10/19/2008   Hypertension associated with diabetes ([place]) 10/19/2008   CORONARY ATHEROSCLEROSIS NATIVE CORONARY ARTERY 10/17/2008   GERD (gastroesophageal reflux disease) 12/27/2007    Past Surgical History:  Procedure Laterality Date   CARDIAC CATHETERIZATION  06-14-2004  dr Lia Foyer   total occlusion mD1, RI 30-40%, mRCA nondominant hazy 80% and scattered 30-40%,  ef 71% (medical mangement)   CARDIOVASCULAR STRESS TEST  09-17-2015   dr Domenic Polite   Low risk nuclear study w/ reversible mild small anteroapical defect,  normal LV function and wall motion, nuclear stress ef 61%   CATARACT EXTRACTION W/ INTRAOCULAR LENS IMPLANT Right 07/2014   COLONOSCOPY  09/10/2008   normal   CORONARY ANGIOPLASTY WITH STENT PLACEMENT  12-17-2004   dr benismhon/ dr Olevia Perches   nonobstructive cad LAD and LCFx,  BMS x1 to total occlusion RCA    CORONARY ATHERECTOMY  11/22/2019   CORONARY ATHERECTOMY N/A 11/22/2019   Procedure: CORONARY ATHERECTOMY;  Surgeon:  Burnell Blanks, MD;  Location: [place] [facility];  Service: Cardiovascular;  Laterality: N/A;   CYSTOSCOPY W/ URETEROSCOPY W/ LITHOTRIPSY  08/2010   ERCP  03/14/2008   EYE SURGERY Bilateral    cataracts   LAPAROSCOPIC CHOLECYSTECTOMY  05/2012   LEFT HEART CATHETERIZATION WITH CORONARY ANGIOGRAM N/A 05/14/2011   Procedure: LEFT HEART CATHETERIZATION WITH CORONARY ANGIOGRAM;  Surgeon: Sherren Mocha, MD;  Location: [facility] C[facility] LAB;  Service: Cardiovascular;  Laterality: N/A;    non-obstructive LM, LAD, LCFx, patent RCA stent   LUMBAR LAMINECTOMY/DECOMPRESSION MICRODISCECTOMY N/A 02/24/2017   Procedure: Microlumbar decompression L4-5, L5-S1;  Surgeon: Susa Day, MD;  Location: [facility];  Service: Orthopedics;  Laterality: N/A;  120 mins   RIGHT/LEFT HEART CATH AND CORONARY ANGIOGRAPHY N/A 11/14/2019   Procedure: RIGHT/LEFT HEART CATH AND CORONARY ANGIOGRAPHY;  Surgeon: Burnell Blanks, MD;  Location: [place] [facility];  Service: Cardiovascular;  Laterality: N/A;   ROTATOR CUFF REPAIR Right 03/2002   ROTATOR CUFF REPAIR Left 12/11/2015   TEE WITHOUT CARDIOVERSION N/A 12/04/2019   Procedure: TRANSESOPHAGEAL ECHOCARDIOGRAM (TEE);  Surgeon: Burnell Blanks, MD;  Location: [place] [facility];  Service: Open Heart Surgery;  Laterality: N/A;   TOTAL HIP ARTHROPLASTY Right 12/20/2008   TOTAL HIP ARTHROPLASTY Left 07/05/2018   Procedure: TOTAL HIP ARTHROPLASTY ANTERIOR APPROACH;  Surgeon: Gaynelle Arabian, MD;  Location: [facility];  Service: Orthopedics;  Laterality: Left;  165min   TOTAL KNEE ARTHROPLASTY Bilateral left 12-11-2003/  right 07-31-2004   dr Wynelle [facility]   TRANSCATHETER AORTIC VALVE REPLACEMENT, TRANSFEMORAL N/A 12/04/2019   Procedure: TRANSCATHETER AORTIC VALVE REPLACEMENT, TRANSFEMORAL;  Surgeon: Burnell Blanks, MD;  Location: [place] [facility];  Service: Open Heart Surgery;  Laterality: N/A;   UPPER GASTROINTESTINAL ENDOSCOPY  01/08/2008   bx, inlet patch,  duodenitis   YAG LASER APPLICATION Right 5/73/2202   Procedure: YAG LASER APPLICATION;  Surgeon: Williams Che, MD;  Location: [facility];  Service: Ophthalmology;  Laterality: Right;       Family History  Problem Relation Age of Onset   Colon cancer Mother        Diagnosed age 20   Cancer Mother 40   Heart disease Father    Heart attack Father    Breast cancer Sister    Heart disease Brother    Brain cancer Sister    Heart disease Brother    Bladder Cancer Brother    Cancer - Other Brother     Social History   Tobacco Use   Smoking status: Former    Packs/day: 1.00    Years: 20.00    Pack years: 20.00    Types: Cigarettes    Start date: 01/18/1963  Quit date: 08/11/1988    Years since quitting: 31.9   Smokeless tobacco: Former    Types: Chew    Quit date: 01/11/1989   Tobacco comments:    chewed 1 pack tobacco/day for 15 years  Vaping Use   Vaping Use: Never used  Substance Use Topics   Alcohol use: No    Alcohol/week: 0.0 standard drinks   Drug use: No    Home Medications Prior to Admission medications   Medication Sig Start Date End Date Taking? Authorizing Provider  acetaminophen (TYLENOL) 650 MG CR tablet Take 1,300 mg by mouth 2 (two) times daily.    [provider]  ACETAMINOPHEN-BUTALBITAL 50-325 MG TABS Take by mouth.    [provider]  amLODipine (NORVASC) 10 MG tablet TAKE 1 TABLET BY MOUTH  DAILY 05/27/20   Eileen Stanford, PA-C  amoxicillin-clavulanate (AUGMENTIN) 875-125 MG tablet Take 1 tablet by mouth 2 (two) times daily. 06/20/20   Ivy Lynn, NP  aspirin EC 81 MG tablet Take 81 mg by mouth at bedtime.     [provider]  atorvastatin (LIPITOR) 40 MG tablet TAKE 1 TABLET BY MOUTH IN  THE EVENING 05/27/20   Eileen Stanford, PA-C  butalbital-acetaminophen-caffeine (FIORICET) [phone] MG tablet Take 1 tablet by mouth 2 (two) times daily as needed for headache.    [provider]  cetirizine (ZYRTEC) 10  MG tablet Take 10 mg by mouth daily with supper.     [provider]  Cholecalciferol (VITAMIN D3) 50 MCG (2000 UT) capsule Take 4,000 Units by mouth daily.     [provider]  Continuous Blood Gluc Receiver (DEXCOM G6 RECEIVER) DEVI USE TO CHECK BLOOD SUGAR UP TO 4 TIMES DAILY AS DIRECTED. 1 DEVICE PER YEAR. DX: E11.9 06/28/19   Dettinger, Fransisca Kaufmann, MD  Continuous Blood Gluc Sensor (DEXCOM G6 SENSOR) MISC USE TO CHECK BLOOD SUGAR UP TO 4 TIMES DAILY AS DIRECTED. CHANGE SENSOR EVERY 30 DAYS. DX: E11.9 06/28/19   Dettinger, Fransisca Kaufmann, MD  Continuous Blood Gluc Transmit (DEXCOM G6 TRANSMITTER) MISC USE TO CHECK BLOOD SUGAR UP TO 4 TIMES DAILY AS DIRECTED. CHANGE TRANSMITTER EVERY 3 MONTHS. DX: E11.9 06/28/19   Dettinger, Fransisca Kaufmann, MD  docusate sodium (COLACE) 100 MG capsule Take 100 mg by mouth daily as needed for mild constipation or moderate constipation.     [provider]  donepezil (ARICEPT) 5 MG tablet Take 1 tablet (5 mg total) by mouth at bedtime. 04/02/20   Dettinger, Fransisca Kaufmann, MD  famotidine (PEPCID) 20 MG tablet Take 1 tablet (20 mg total) by mouth 2 (two) times daily with a meal. 04/02/20   Dettinger, Fransisca Kaufmann, MD  furosemide (LASIX) 20 MG tablet TAKE 1 TABLET BY MOUTH  DAILY AS NEEDED 03/13/20   Burnell Blanks, MD  glucose blood (ONETOUCH ULTRA) test strip Check BS TID Dx E11.59 02/05/20   Dettinger, Fransisca Kaufmann, MD  insulin glargine (LANTUS SOLOSTAR) 100 UNIT/ML Solostar Pen INJECT SUBCUTANEOUSLY 56 UNITS AT BEDTIME 04/03/20   Dettinger, Fransisca Kaufmann, MD  insulin lispro (HUMALOG) 100 UNIT/ML injection Inject 0-20 Units into the skin 3 (three) times daily as needed for high blood sugar.    [provider]  isosorbide mononitrate (IMDUR) 30 MG 24 hr tablet TAKE 1 TABLET BY MOUTH IN  THE EVENING 02/11/20   Satira Sark, MD  metFORMIN (GLUCOPHAGE-XR) 500 MG 24 hr tablet Take 2 tablets (1,000 mg total) by mouth 2 (two) times daily. 04/02/20  Dettinger, Fransisca Kaufmann,  MD  nitroGLYCERIN (NITROSTAT) 0.4 MG SL tablet DISSOLVE ONE TABLET UNDER THE TONGUE EVERY 5 MINUTES AS NEEDED FOR CHEST PAIN.  DO NOT EXCEED A TOTAL OF 3 DOSES IN 15 MINUTES 10/24/19   Satira Sark, MD  Omega-3 Fatty Acids (FISH OIL) 1200 MG CAPS Take 1,200-2,400 mg by mouth See admin instructions. Tale 1200 mg in the morning and 2400 mg in the evening    [provider]  PARoxetine (PAXIL) 20 MG tablet Take 1 tablet (20 mg total) by mouth daily. 04/02/20   Dettinger, Fransisca Kaufmann, MD  tamsulosin (FLOMAX) 0.4 MG CAPS capsule Take 1 capsule (0.4 mg total) by mouth daily. 04/02/20   Dettinger, Fransisca Kaufmann, MD  Turmeric Curcumin 500 MG CAPS Take 500 mg by mouth daily.    [provider]  vitamin B-12 (CYANOCOBALAMIN) 1000 MCG tablet Take 2,000 mcg by mouth daily.    [provider]    Allergies    Ace inhibitors, Angiotensin receptor blockers, Ciprofloxacin, Oxycodone-acetaminophen, Robaxin [methocarbamol], Toradol [ketorolac tromethamine], Valium, and Doxycycline  Review of Systems   Review of Systems  Constitutional:  Positive for chills and fever.  Respiratory:  Positive for shortness of breath.   Gastrointestinal:  Positive for nausea and vomiting.  Neurological:  Positive for weakness.  All other systems reviewed and are negative.  Physical Exam Updated Vital Signs BP (!) 167/62   Pulse 70   Temp 98.8 F (37.1 C) (Oral)   Resp 16   Ht 5\' 6"  (1.676 m)   Wt 102.1 kg   SpO2 95%   BMI 36.32 kg/m   Physical Exam Vitals and nursing note reviewed.  Constitutional:      Appearance: Normal appearance.  HENT:     Head: Normocephalic and atraumatic.     Right Ear: External ear normal.     Left Ear: External ear normal.     Nose: Nose normal.     Mouth/Throat:     Mouth: Mucous membranes are dry.  Eyes:     Extraocular Movements: Extraocular movements intact.     Conjunctiva/sclera: Conjunctivae normal.     Pupils: Pupils are equal, round, and reactive to  light.  Cardiovascular:     Rate and Rhythm: Normal rate and regular rhythm.     Pulses: Normal pulses.     Heart sounds: Normal heart sounds.  Pulmonary:     Effort: Pulmonary effort is normal.     Breath sounds: Normal breath sounds.  Abdominal:     General: Abdomen is flat. Bowel sounds are normal.     Palpations: Abdomen is soft.  Musculoskeletal:        General: Normal range of motion.     Cervical back: Normal range of motion and neck supple.  Skin:    General: Skin is warm.     Capillary Refill: Capillary refill takes less than 2 seconds.  Neurological:     General: No focal deficit present.     Mental Status: He is alert and oriented to person, place, and time.  Psychiatric:        Mood and Affect: Mood normal.        Behavior: Behavior normal.        Thought Content: Thought content normal.        Judgment: Judgment normal.    ED Results / Procedures / Treatments   Labs (all labs ordered are listed, but only abnormal results are displayed) Labs Reviewed  BASIC METABOLIC  PANEL - Abnormal; Notable for the following components:      Result Value   Sodium 132 (*)    Glucose, Bld 228 (*)    Calcium 8.3 (*)    All other components within normal limits  CBC WITH DIFFERENTIAL/PLATELET - Abnormal; Notable for the following components:   WBC 11.1 (*)    Hemoglobin 12.9 (*)    Neutro Abs 8.8 (*)    All other components within normal limits  URINALYSIS, ROUTINE W REFLEX MICROSCOPIC - Abnormal; Notable for the following components:   Glucose, UA >=500 (*)    Hgb urine dipstick MODERATE (*)    Ketones, ur 20 (*)    Protein, ur 100 (*)    RBC / HPF >50 (*)    Bacteria, UA RARE (*)    All other components within normal limits  CULTURE, BLOOD (ROUTINE X 2)  CULTURE, BLOOD (ROUTINE X 2)  RESP PANEL BY RT-PCR (FLU A&B, COVID) ARPGX2  URINE CULTURE  BRAIN NATRIURETIC PEPTIDE  LACTIC ACID, PLASMA  LACTIC ACID, PLASMA  TROPONIN I (HIGH SENSITIVITY)  TROPONIN I (HIGH  SENSITIVITY)    EKG EKG Interpretation  Date/Time:  Tuesday July 01 2020 12:32:53 EDT Ventricular Rate:  82 PR Interval:  161 QRS Duration: 104 QT Interval:  362 QTC Calculation: 423 R Axis:   1 Text Interpretation: Sinus rhythm Probable left atrial enlargement Abnormal R-wave progression, early transition Borderline repolarization abnormality Baseline wander in lead(s) I III No significant change since last tracing Confirmed by Isla Pence [phone]) on 07/01/2020 12:59:21 PM  Radiology DG Chest Port 1 View  Result Date: 07/01/2020 CLINICAL DATA:  Shortness of breath. EXAM: PORTABLE CHEST 1 VIEW COMPARISON:  December 04, 2019. FINDINGS: Low lung volumes. Enlarged cardiac silhouette, accentuated by AP technique and low lung volumes. No visible pleural effusions or pneumothorax. Hazy bibasilar opacities likely represents accenuated diaphragmatic/mediastinal fat given lordotic positioning. IMPRESSION: 1. Limited evaluation due to low lung volumes and lordotic positioning without evidence of acute cardiopulmonary disease. Dedicated PA and lateral radiographs could better evaluate the lung bases if clinically indicated. 2. Cardiomegaly. Electronically Signed   By: Margaretha Sheffield MD   On: 07/01/2020 13:27    Procedures Procedures   Medications Ordered in ED Medications  ibuprofen (ADVIL) tablet 800 mg (800 mg Oral Given 07/01/20 1251)  sodium chloride 0.9 % bolus 1,000 mL (1,000 mLs Intravenous New Bag/Given 07/01/20 1434)  iohexol (OMNIPAQUE) 300 MG/ML solution 100 mL (100 mLs Intravenous Contrast Given 07/01/20 1453)    ED Course  I have reviewed the triage vital signs and the nursing notes.  Pertinent labs & imaging results that were available during my care of the patient were reviewed by me and considered in my medical decision making (see chart for details).    MDM Rules/Calculators/A&P                          Pt given IVFs and ibuprofen for his fever.  His Covid is negative.   Lactic neg. UA shows no uti.  CXR not a great film.  CT chest/abd/pelvis pending.  O2 sats have been in the mid-90s on RA here.  Pt signed out to Dr. Reather Converse at shift change.  CRITICAL CARE Performed by: Isla Pence   Total critical care time: 30 minutes  Critical care time was exclusive of separately billable procedures and treating other patients.  Critical care was necessary to treat or prevent imminent or life-threatening deterioration.  Critical care was time spent personally by me on the following activities: development of treatment plan with patient and/or surrogate as well as nursing, discussions with consultants, evaluation of patient's response to treatment, examination of patient, obtaining history from patient or surrogate, ordering and performing treatments and interventions, ordering and review of laboratory studies, ordering and review of radiographic studies, pulse oximetry and re-evaluation of patient's condition.    [name] was evaluated in Emergency Department on 07/01/2020 for the symptoms described in the history of present illness. He was evaluated in the context of the global COVID-19 pandemic, which necessitated consideration that the patient might be at risk for infection with the SARS-CoV-2 virus that causes COVID-19. Institutional protocols and algorithms that pertain to the evaluation of patients at risk for COVID-19 are in a state of rapid change based on information released by regulatory bodies including the CDC and federal and state organizations. These policies and algorithms were followed during the patient's care in the ED.   Final Clinical Impression(s) / ED Diagnoses Final diagnoses:  Fever  Non-intractable vomiting with nausea, unspecified vomiting type    Rx / DC Orders ED Discharge Orders     None        Isla Pence, MD 07/05/20 [phone]

## 2020-07-02 ENCOUNTER — Observation Stay (HOSPITAL_COMMUNITY): Payer: Medicare Other

## 2020-07-02 DIAGNOSIS — R531 Weakness: Secondary | ICD-10-CM

## 2020-07-02 DIAGNOSIS — Z8661 Personal history of infections of the central nervous system: Secondary | ICD-10-CM | POA: Diagnosis not present

## 2020-07-02 DIAGNOSIS — Z1611 Resistance to penicillins: Secondary | ICD-10-CM | POA: Diagnosis present

## 2020-07-02 DIAGNOSIS — I7121 Aneurysm of the ascending aorta, without rupture: Secondary | ICD-10-CM

## 2020-07-02 DIAGNOSIS — Z87891 Personal history of nicotine dependence: Secondary | ICD-10-CM | POA: Diagnosis not present

## 2020-07-02 DIAGNOSIS — G9340 Encephalopathy, unspecified: Secondary | ICD-10-CM | POA: Diagnosis present

## 2020-07-02 DIAGNOSIS — I712 Thoracic aortic aneurysm, without rupture: Secondary | ICD-10-CM | POA: Diagnosis present

## 2020-07-02 DIAGNOSIS — E785 Hyperlipidemia, unspecified: Secondary | ICD-10-CM | POA: Diagnosis present

## 2020-07-02 DIAGNOSIS — R509 Fever, unspecified: Secondary | ICD-10-CM | POA: Diagnosis not present

## 2020-07-02 DIAGNOSIS — E1165 Type 2 diabetes mellitus with hyperglycemia: Secondary | ICD-10-CM | POA: Diagnosis present

## 2020-07-02 DIAGNOSIS — I252 Old myocardial infarction: Secondary | ICD-10-CM | POA: Diagnosis not present

## 2020-07-02 DIAGNOSIS — Z955 Presence of coronary angioplasty implant and graft: Secondary | ICD-10-CM | POA: Diagnosis not present

## 2020-07-02 DIAGNOSIS — D72829 Elevated white blood cell count, unspecified: Secondary | ICD-10-CM

## 2020-07-02 DIAGNOSIS — R519 Headache, unspecified: Secondary | ICD-10-CM | POA: Diagnosis not present

## 2020-07-02 DIAGNOSIS — K219 Gastro-esophageal reflux disease without esophagitis: Secondary | ICD-10-CM | POA: Diagnosis present

## 2020-07-02 DIAGNOSIS — I2510 Atherosclerotic heart disease of native coronary artery without angina pectoris: Secondary | ICD-10-CM | POA: Diagnosis present

## 2020-07-02 DIAGNOSIS — G4733 Obstructive sleep apnea (adult) (pediatric): Secondary | ICD-10-CM | POA: Diagnosis present

## 2020-07-02 DIAGNOSIS — E871 Hypo-osmolality and hyponatremia: Secondary | ICD-10-CM | POA: Diagnosis present

## 2020-07-02 DIAGNOSIS — A879 Viral meningitis, unspecified: Secondary | ICD-10-CM | POA: Diagnosis present

## 2020-07-02 DIAGNOSIS — G9341 Metabolic encephalopathy: Secondary | ICD-10-CM | POA: Diagnosis present

## 2020-07-02 DIAGNOSIS — E860 Dehydration: Secondary | ICD-10-CM

## 2020-07-02 DIAGNOSIS — Z952 Presence of prosthetic heart valve: Secondary | ICD-10-CM | POA: Diagnosis not present

## 2020-07-02 DIAGNOSIS — N390 Urinary tract infection, site not specified: Secondary | ICD-10-CM | POA: Diagnosis present

## 2020-07-02 DIAGNOSIS — Z20822 Contact with and (suspected) exposure to covid-19: Secondary | ICD-10-CM | POA: Diagnosis present

## 2020-07-02 DIAGNOSIS — I152 Hypertension secondary to endocrine disorders: Secondary | ICD-10-CM | POA: Diagnosis present

## 2020-07-02 DIAGNOSIS — Z7984 Long term (current) use of oral hypoglycemic drugs: Secondary | ICD-10-CM | POA: Diagnosis not present

## 2020-07-02 DIAGNOSIS — Z87442 Personal history of urinary calculi: Secondary | ICD-10-CM | POA: Diagnosis not present

## 2020-07-02 DIAGNOSIS — F0390 Unspecified dementia without behavioral disturbance: Secondary | ICD-10-CM

## 2020-07-02 DIAGNOSIS — N400 Enlarged prostate without lower urinary tract symptoms: Secondary | ICD-10-CM | POA: Diagnosis present

## 2020-07-02 DIAGNOSIS — E1169 Type 2 diabetes mellitus with other specified complication: Secondary | ICD-10-CM | POA: Diagnosis present

## 2020-07-02 LAB — CBC
HCT: 46.9 % (ref 39.0–52.0)
Hemoglobin: 14.7 g/dL (ref 13.0–17.0)
MCH: 27.9 pg (ref 26.0–34.0)
MCHC: 31.3 g/dL (ref 30.0–36.0)
MCV: 89 fL (ref 80.0–100.0)
Platelets: 301 10*3/uL (ref 150–400)
RBC: 5.27 MIL/uL (ref 4.22–5.81)
RDW: 15.2 % (ref 11.5–15.5)
WBC: 11 10*3/uL — ABNORMAL HIGH (ref 4.0–10.5)
nRBC: 0 % (ref 0.0–0.2)

## 2020-07-02 LAB — COMPREHENSIVE METABOLIC PANEL
ALT: 18 U/L (ref 0–44)
ALT: 18 U/L (ref 0–44)
AST: 18 U/L (ref 15–41)
AST: 18 U/L (ref 15–41)
Albumin: 3.6 g/dL (ref 3.5–5.0)
Albumin: 3.4 g/dL — ABNORMAL LOW (ref 3.5–5.0)
Alkaline Phosphatase: 74 U/L (ref 38–126)
Alkaline Phosphatase: 74 U/L (ref 38–126)
Anion gap: 8 (ref 5–15)
Anion gap: 8 (ref 5–15)
BUN: 12 mg/dL (ref 8–23)
BUN: 14 mg/dL (ref 8–23)
CO2: 26 mmol/L (ref 22–32)
CO2: 23 mmol/L (ref 22–32)
Calcium: 8.4 mg/dL — ABNORMAL LOW (ref 8.9–10.3)
Calcium: 8.4 mg/dL — ABNORMAL LOW (ref 8.9–10.3)
Chloride: 101 mmol/L (ref 98–111)
Chloride: 100 mmol/L (ref 98–111)
Creatinine, Ser: 0.83 mg/dL (ref 0.61–1.24)
Creatinine, Ser: 0.86 mg/dL (ref 0.61–1.24)
GFR, Estimated: >60 mL/min (ref >60)
GFR, Estimated: >60 mL/min (ref >60)
Glucose, Bld: 237 mg/dL — ABNORMAL HIGH (ref 70–99)
Glucose, Bld: 313 mg/dL — ABNORMAL HIGH (ref 70–99)
Potassium: 4 mmol/L (ref 3.5–5.1)
Potassium: 3.7 mmol/L (ref 3.5–5.1)
Sodium: 135 mmol/L (ref 135–145)
Sodium: 131 mmol/L — ABNORMAL LOW (ref 135–145)
Total Bilirubin: 1 mg/dL (ref 0.3–1.2)
Total Bilirubin: 0.7 mg/dL (ref 0.3–1.2)
Total Protein: 7.3 g/dL (ref 6.5–8.1)
Total Protein: 7 g/dL (ref 6.5–8.1)

## 2020-07-02 LAB — PROTIME-INR
INR: 1 (ref 0.8–1.2)
Prothrombin Time: 13.5 seconds (ref 11.4–15.2)

## 2020-07-02 LAB — CBC WITH DIFFERENTIAL/PLATELET
Abs Immature Granulocytes: 0.05 10*3/uL (ref 0.00–0.07)
Basophils Absolute: 0.1 10*3/uL (ref 0.0–0.1)
Basophils Relative: 0 %
Eosinophils Absolute: 0.1 10*3/uL (ref 0.0–0.5)
Eosinophils Relative: 0 %
HCT: 43.2 % (ref 39.0–52.0)
Hemoglobin: 13.6 g/dL (ref 13.0–17.0)
Immature Granulocytes: 0 %
Lymphocytes Relative: 13 %
Lymphs Abs: 1.5 10*3/uL (ref 0.7–4.0)
MCH: 28 pg (ref 26.0–34.0)
MCHC: 31.5 g/dL (ref 30.0–36.0)
MCV: 89.1 fL (ref 80.0–100.0)
Monocytes Absolute: 0.7 10*3/uL (ref 0.1–1.0)
Monocytes Relative: 6 %
Neutro Abs: 9 10*3/uL — ABNORMAL HIGH (ref 1.7–7.7)
Neutrophils Relative %: 81 %
Platelets: 274 10*3/uL (ref 150–400)
RBC: 4.85 MIL/uL (ref 4.22–5.81)
RDW: 14.9 % (ref 11.5–15.5)
WBC: 11.3 10*3/uL — ABNORMAL HIGH (ref 4.0–10.5)
nRBC: 0 % (ref 0.0–0.2)

## 2020-07-02 LAB — HEMOGLOBIN A1C
Hgb A1c MFr Bld: 8.4 % — ABNORMAL HIGH (ref 4.8–5.6)
Mean Plasma Glucose: 194.38 mg/dL

## 2020-07-02 LAB — MAGNESIUM: Magnesium: 1.7 mg/dL (ref 1.7–2.4)

## 2020-07-02 LAB — SEDIMENTATION RATE: Sed Rate: 22 mm/hr — ABNORMAL HIGH (ref 0–16)

## 2020-07-02 LAB — APTT: aPTT: 27 seconds (ref 24–36)

## 2020-07-02 LAB — PROCALCITONIN: Procalcitonin: <0.1 ng/mL

## 2020-07-02 LAB — GLUCOSE, CAPILLARY
Glucose-Capillary: 225 mg/dL — ABNORMAL HIGH (ref 70–99)
Glucose-Capillary: 251 mg/dL — ABNORMAL HIGH (ref 70–99)
Glucose-Capillary: 258 mg/dL — ABNORMAL HIGH (ref 70–99)

## 2020-07-02 LAB — HIV ANTIBODY (ROUTINE TESTING W REFLEX): HIV Screen 4th Generation wRfx: NONREACTIVE

## 2020-07-02 LAB — PHOSPHORUS: Phosphorus: 2.3 mg/dL — ABNORMAL LOW (ref 2.5–4.6)

## 2020-07-02 LAB — C-REACTIVE PROTEIN: CRP: 1.2 mg/dL — ABNORMAL HIGH (ref <1.0)

## 2020-07-02 MED ORDER — DONEPEZIL HCL 5 MG PO TABS
5.0000 mg | ORAL_TABLET | Freq: Every day | ORAL | Status: DC
  Administered 2020-07-02 – 2020-07-06 (×5): 5 mg via ORAL
  Filled 2020-07-02 (×5): qty 1

## 2020-07-02 MED ORDER — ACETAMINOPHEN 325 MG PO TABS
650.0000 mg | ORAL_TABLET | Freq: Four times a day (QID) | ORAL | Status: DC | PRN
  Administered 2020-07-02: 650 mg via ORAL
  Filled 2020-07-02: qty 2

## 2020-07-02 MED ORDER — VITAMIN B-12 1000 MCG PO TABS
2000.0000 ug | ORAL_TABLET | Freq: Every day | ORAL | Status: DC
  Administered 2020-07-02 – 2020-07-07 (×6): 2000 ug via ORAL
  Filled 2020-07-02 (×5): qty 2

## 2020-07-02 MED ORDER — SODIUM CHLORIDE 0.9 % IV SOLN: Freq: Once | INTRAVENOUS | Status: AC

## 2020-07-02 MED ORDER — FAMOTIDINE 20 MG PO TABS
20.0000 mg | ORAL_TABLET | Freq: Every day | ORAL | Status: DC
  Administered 2020-07-02 – 2020-07-07 (×6): 20 mg via ORAL
  Filled 2020-07-02 (×6): qty 1

## 2020-07-02 MED ORDER — INSULIN ASPART 100 UNIT/ML IJ SOLN
0.0000 [IU] | Freq: Three times a day (TID) | INTRAMUSCULAR | Status: DC
  Administered 2020-07-02 (×2): 5 [IU] via SUBCUTANEOUS
  Administered 2020-07-03: 7 [IU] via SUBCUTANEOUS
  Administered 2020-07-03: 5 [IU] via SUBCUTANEOUS
  Administered 2020-07-04: 3 [IU] via SUBCUTANEOUS
  Administered 2020-07-04: 9 [IU] via SUBCUTANEOUS
  Administered 2020-07-04: 3 [IU] via SUBCUTANEOUS
  Administered 2020-07-05: 5 [IU] via SUBCUTANEOUS
  Administered 2020-07-05 (×2): 2 [IU] via SUBCUTANEOUS
  Administered 2020-07-06 (×2): 5 [IU] via SUBCUTANEOUS
  Administered 2020-07-06: 2 [IU] via SUBCUTANEOUS
  Administered 2020-07-07: 7 [IU] via SUBCUTANEOUS
  Administered 2020-07-07: 2 [IU] via SUBCUTANEOUS

## 2020-07-02 MED ORDER — ENOXAPARIN SODIUM 60 MG/0.6ML IJ SOSY
50.0000 mg | PREFILLED_SYRINGE | INTRAMUSCULAR | Status: DC
  Administered 2020-07-03 – 2020-07-06 (×4): 50 mg via SUBCUTANEOUS
  Filled 2020-07-02 (×4): qty 0.6

## 2020-07-02 MED ORDER — ATORVASTATIN CALCIUM 40 MG PO TABS
40.0000 mg | ORAL_TABLET | Freq: Every evening | ORAL | Status: DC
  Administered 2020-07-02 – 2020-07-06 (×5): 40 mg via ORAL
  Filled 2020-07-02 (×5): qty 1

## 2020-07-02 MED ORDER — TAMSULOSIN HCL 0.4 MG PO CAPS
0.4000 mg | ORAL_CAPSULE | Freq: Every day | ORAL | Status: DC
  Administered 2020-07-02 – 2020-07-07 (×6): 0.4 mg via ORAL
  Filled 2020-07-02 (×6): qty 1

## 2020-07-02 MED ORDER — AMLODIPINE BESYLATE 5 MG PO TABS
10.0000 mg | ORAL_TABLET | Freq: Every day | ORAL | Status: DC
  Administered 2020-07-02 – 2020-07-07 (×6): 10 mg via ORAL
  Filled 2020-07-02 (×6): qty 2

## 2020-07-02 MED ORDER — NITROGLYCERIN 0.4 MG SL SUBL: 0.4000 mg | SUBLINGUAL_TABLET | SUBLINGUAL | Status: DC | PRN

## 2020-07-02 MED ORDER — ASPIRIN EC 81 MG PO TBEC
81.0000 mg | DELAYED_RELEASE_TABLET | Freq: Every day | ORAL | Status: DC
  Administered 2020-07-04 – 2020-07-06 (×3): 81 mg via ORAL
  Filled 2020-07-02 (×3): qty 1

## 2020-07-02 MED ORDER — ONDANSETRON HCL 4 MG/2ML IJ SOLN
4.0000 mg | Freq: Four times a day (QID) | INTRAMUSCULAR | Status: DC | PRN
  Administered 2020-07-02 – 2020-07-04 (×2): 4 mg via INTRAVENOUS
  Filled 2020-07-02 (×3): qty 2

## 2020-07-02 MED ORDER — INSULIN GLARGINE 100 UNIT/ML ~~LOC~~ SOLN
56.0000 [IU] | Freq: Every day | SUBCUTANEOUS | Status: DC
  Administered 2020-07-02 – 2020-07-03 (×2): 56 [IU] via SUBCUTANEOUS
  Filled 2020-07-02 (×5): qty 0.56

## 2020-07-02 MED ORDER — ISOSORBIDE MONONITRATE ER 60 MG PO TB24
30.0000 mg | ORAL_TABLET | Freq: Every evening | ORAL | Status: DC
  Administered 2020-07-02 – 2020-07-06 (×5): 30 mg via ORAL
  Filled 2020-07-02 (×5): qty 1

## 2020-07-02 MED ORDER — PAROXETINE HCL 20 MG PO TABS
20.0000 mg | ORAL_TABLET | Freq: Every day | ORAL | Status: DC
  Administered 2020-07-02 – 2020-07-07 (×6): 20 mg via ORAL
  Filled 2020-07-02 (×6): qty 1

## 2020-07-02 MED ORDER — ASPIRIN EC 81 MG PO TBEC: 81.0000 mg | DELAYED_RELEASE_TABLET | Freq: Every day | ORAL | Status: DC

## 2020-07-02 MED ORDER — AMOXICILLIN-POT CLAVULANATE 500-125 MG PO TABS
1.0000 | ORAL_TABLET | Freq: Three times a day (TID) | ORAL | Status: DC
  Administered 2020-07-02 – 2020-07-03 (×3): 500 mg via ORAL
  Filled 2020-07-02 (×3): qty 1

## 2020-07-02 MED ORDER — LABETALOL HCL 200 MG PO TABS
200.0000 mg | ORAL_TABLET | Freq: Two times a day (BID) | ORAL | Status: DC
  Administered 2020-07-02 – 2020-07-07 (×11): 200 mg via ORAL
  Filled 2020-07-02 (×11): qty 1

## 2020-07-02 MED ORDER — POLYVINYL ALCOHOL 1.4 % OP SOLN
1.0000 [drp] | OPHTHALMIC | Status: DC | PRN
  Administered 2020-07-02 – 2020-07-03 (×3): 1 [drp] via OPHTHALMIC
  Filled 2020-07-02 (×2): qty 15

## 2020-07-02 MED ORDER — ACETAMINOPHEN 325 MG PO TABS
650.0000 mg | ORAL_TABLET | Freq: Two times a day (BID) | ORAL | Status: DC
  Administered 2020-07-02 – 2020-07-07 (×10): 650 mg via ORAL
  Filled 2020-07-02 (×10): qty 2

## 2020-07-02 MED ORDER — IBUPROFEN 400 MG PO TABS
400.0000 mg | ORAL_TABLET | Freq: Four times a day (QID) | ORAL | Status: DC | PRN
  Filled 2020-07-02: qty 1

## 2020-07-02 MED ORDER — BUTALBITAL-APAP-CAFFEINE 50-325-40 MG PO TABS: 1.0000 | ORAL_TABLET | Freq: Two times a day (BID) | ORAL | Status: DC | PRN

## 2020-07-02 NOTE — Evaluation (Signed)
Occupational Therapy Evaluation Patient Details Name: [name] MRN: [id number] DOB: [birth year] Today's Date: 07/02/2020    History of Present Illness JACKSYN BEEKS is a 80 y.o. male with medical history significant for CAD status post and placement, T2DM, GERD, hypertension, hyperlipidemia, BPH, dementia and OSA on CPAP who presents to the emergency department due to 1 day onset of fatigue, fever, body aches, headache, vomiting and generalized weakness.  He was recently exposed to people with COVID while bear hunting in [place], he denies any tick bite, but was complaining of generalized weakness and nausea limiting his ambulation.  Patient had a virtual visit with his PCP who asked him to call EMS if symptoms worsen, so this resulted in patient calling EMS.  He was already vaccinated against COVID and also already had a booster.   Clinical Impression   Pt agreeable to OT/Pt co-evaluation with PT joining midway through session. Upon arrival pt was seated at EOB with adult diaper pulled halfway down his legs with urine on the floor. Pt was assisted in removing adult diapers and floor was cleaned. Pt required Min A for bed mobility and min G assist for transfers and ambulation without RW. Pt was able to don deodorant while seated in chair and required assist for donning socks which is reportedly typical at baseline. Pt demonstrates general B UE weakness. Pt will benefit from continued OT services in the hospital and recommended venue below to increase strength, balance, and endurance for safe ADL's.     Follow Up Recommendations  Home health OT;Supervision - Intermittent    Equipment Recommendations  None recommended by OT           Precautions / Restrictions Precautions Precautions: Fall Restrictions Weight Bearing Restrictions: No      Mobility Bed Mobility Overal bed mobility: Needs Assistance Bed Mobility: Supine to Sit     Supine to sit: Min assist     General bed  mobility comments: increased time, labored movement    Transfers Overall transfer level: Needs assistance Equipment used: 1 person hand held assist Transfers: Sit to/from [facility] Sit to Stand: Min guard Stand pivot transfers: Min guard       General transfer comment: slightly unsteady, increased time    Balance Overall balance assessment: Needs assistance Sitting-balance support: Feet supported;No upper extremity supported Sitting balance-Leahy Scale: Good Sitting balance - Comments: seated at EOB   Standing balance support: During functional activity;Single extremity supported Standing balance-Leahy Scale: Fair Standing balance comment: hand held assist                           ADL either performed or assessed with clinical judgement   ADL Overall ADL's : Needs assistance/impaired     Grooming: Applying deodorant;Sitting;Modified independent Grooming Details (indicate cue type and reason): able to apply deodorant seated in chair             Lower Body Dressing: Total assistance;Bed level Lower Body Dressing Details (indicate cue type and reason): donning socks at bed level; wife assists at baseline Toilet Transfer: Min Designer, jewellery Details (indicate cue type and reason): simulated via EOB to chair transfer [place] and Hygiene: Moderate assistance;Sitting/lateral lean Toileting - Clothing Manipulation Details (indicate cue type and reason): Pt requrired assist to doff pull-ups seated at EOB.             Vision Baseline Vision/History: No visual deficits Patient Visual Report: No change  from baseline                  Pertinent Vitals/Pain Pain Assessment: Faces Faces Pain Scale: Hurts even more Pain Location: headache Pain Descriptors / Indicators: Headache Pain Intervention(s): Limited activity within patient's tolerance;Monitored during session;Patient requesting pain meds-RN  notified     Hand Dominance Right   Extremity/Trunk Assessment Upper Extremity Assessment Upper Extremity Assessment: Generalized weakness (4/5 MMT grossly)   Lower Extremity Assessment Lower Extremity Assessment: Defer to PT evaluation   Cervical / Trunk Assessment Cervical / Trunk Assessment: Normal   Communication Communication Communication: No difficulties   Cognition Arousal/Alertness: Awake/alert Behavior During Therapy: WFL for tasks assessed/performed Overall Cognitive Status: Within Functional Limits for tasks assessed                                                      Home Living Family/patient expects to be discharged to:: Private residence Living Arrangements: Spouse/significant other Available Help at Discharge: Family;Available 24 hours/day Type of Home: House Home Access: Stairs to enter [employer] of Steps: 3 Entrance Stairs-Rails: Right;Left;Can reach both Home Layout: Two level;Able to live on main level with bedroom/bathroom Alternate Level Stairs-Number of Steps: Patient does not go upstairs Alternate Level Stairs-Rails:  (pt unsure) Bathroom Shower/Tub: Teacher, early years/pre: Handicapped height Bathroom Accessibility: Yes How Accessible: Accessible via wheelchair;Accessible via walker Home Equipment: [place] - single point;Walker - 2 wheels;Shower seat - built in;Toilet riser;Grab bars - toilet;Grab bars - tub/shower          Prior Functioning/Environment Level of Independence: Needs assistance  Gait / Transfers Assistance Needed: independent ADL's / Homemaking Assistance Needed: Pt does have assist form wife to don socks. Only reported assist needed.   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        OT Problem List: Decreased strength;Decreased activity tolerance;Impaired balance (sitting and/or standing)      OT Treatment/Interventions: Self-care/ADL training;Therapeutic exercise;Therapeutic  activities;DME and/or AE instruction;Balance training;Patient/family education    OT Goals(Current goals can be found in the care plan section) Acute Rehab OT Goals Patient Stated Goal: return home with family to assist OT Goal Formulation: With patient Time For Goal Achievement: 07/16/20 Potential to Achieve Goals: Good  OT Frequency: Min 2X/week   Barriers to D/C:            Co-evaluation PT/OT/SLP Co-Evaluation/Treatment: Yes Reason for Co-Treatment: To address functional/ADL transfers PT goals addressed during session: Mobility/safety with mobility;Balance;Proper use of DME OT goals addressed during session: Strengthening/ROM;ADL's and self-care      AM-PAC OT "6 Clicks" Daily Activity     Outcome Measure Help from another person eating meals?: None Help from another person taking care of personal grooming?: None Help from another person toileting, which includes using toliet, bedpan, or urinal?: A Little Help from another person bathing (including washing, rinsing, drying)?: A Little Help from another person to put on and taking off regular upper body clothing?: None Help from another person to put on and taking off regular lower body clothing?: A Lot 6 Click Score: 20   End of Session Equipment Utilized During Treatment: Gait belt  Activity Tolerance: Patient tolerated treatment well Patient left: in chair;with call bell/phone within reach;with chair alarm set  OT Visit Diagnosis: Unsteadiness on feet (R26.81);Muscle weakness (generalized) (M62.81)  Time: 1959-7471 OT Time Calculation (min): 41 min Charges:  OT General Charges $OT Visit: 1 Visit OT Evaluation $OT Eval Low Complexity: 1 Low  Wylie Russon OT, MOT   Larey Seat 07/02/2020, 12:09 PM

## 2020-07-02 NOTE — Evaluation (Signed)
Physical Therapy Evaluation Patient Details Name: [name] MRN: [id number] DOB: [birth year] Today's Date: 07/02/2020   History of Present Illness  [name] is a 80 y.o. male with medical history significant for CAD status post and placement, T2DM, GERD, hypertension, hyperlipidemia, BPH, dementia and OSA on CPAP who presents to the emergency department due to 1 day onset of fatigue, fever, body aches, headache, vomiting and generalized weakness.  He was recently exposed to people with COVID while bear hunting in [place], he denies any tick bite, but was complaining of generalized weakness and nausea limiting his ambulation.  Patient had a virtual visit with his PCP who asked him to call EMS if symptoms worsen, so this resulted in patient calling EMS.  He was already vaccinated against COVID and also already had a booster.   Clinical Impression  Patient demonstrates slow labored movement for sitting up at bedside requiring min assist to pull self to sitting, slightly unsteady on feet requiring hand held assist for safety, demonstrates good return for walking in room/hallway, but limited mostly due to c/o severe headache - RN notified.  Patient tolerated sitting up in chair after therapy.  Patient will benefit from continued physical therapy in hospital and recommended venue below to increase strength, balance, endurance for safe ADLs and gait.       Follow Up Recommendations Home health PT;Supervision for mobility/OOB;Supervision - Intermittent    Equipment Recommendations  None recommended by PT    Recommendations for Other Services       Precautions / Restrictions Precautions Precautions: Fall Restrictions Weight Bearing Restrictions: No      Mobility  Bed Mobility Overal bed mobility: Needs Assistance Bed Mobility: Supine to Sit     Supine to sit: Min assist     General bed mobility comments: increased time, labored movement    Transfers Overall transfer  level: Needs assistance Equipment used: 1 person hand held assist Transfers: Sit to/from Stand;Stand Pivot Transfers Sit to Stand: Min guard Stand pivot transfers: Min guard       General transfer comment: slightly unsteady, increased time  Ambulation/Gait Ambulation/Gait assistance: Min guard Gait Distance (Feet): 40 Feet Assistive device: 1 person hand held assist Gait Pattern/deviations: Decreased step length - right;Decreased step length - left;Decreased stride length Gait velocity: decreased   General Gait Details: slightly unsteady cadence requiring hand held assist, no loss of balance, limited mostly due to c/o severe headache  Stairs            Wheelchair Mobility    Modified Rankin (Stroke Patients Only)       Balance Overall balance assessment: Needs assistance Sitting-balance support: Feet supported;No upper extremity supported Sitting balance-Leahy Scale: Good Sitting balance - Comments: seated at EOB   Standing balance support: During functional activity;Single extremity supported Standing balance-Leahy Scale: Fair Standing balance comment: hand held assist                             Pertinent Vitals/Pain Pain Assessment: Faces Faces Pain Scale: Hurts even more Pain Location: headache Pain Descriptors / Indicators: Headache Pain Intervention(s): Limited activity within patient's tolerance;Monitored during session;Patient requesting pain meds-RN notified    Home Living Family/patient expects to be discharged to:: Private residence Living Arrangements: Spouse/significant other Available Help at Discharge: Family;Available 24 hours/day Type of Home: House Home Access: Stairs to enter Entrance Stairs-Rails: Right;Left;Can reach both Entrance Stairs-Number of Steps: 3 Home Layout: Two level;Able to live on  main level with bedroom/bathroom Home Equipment: Cane - single point;Walker - 2 wheels;Shower seat - built in;Toilet riser;Grab bars  - toilet;Grab bars - tub/shower      Prior Function Level of Independence: Independent         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     Hand Dominance   Dominant Hand: Right    Extremity/Trunk Assessment   Upper Extremity Assessment Upper Extremity Assessment: Defer to OT evaluation    Lower Extremity Assessment Lower Extremity Assessment: Generalized weakness    Cervical / Trunk Assessment Cervical / Trunk Assessment: Normal  Communication   Communication: No difficulties  Cognition Arousal/Alertness: Awake/alert Behavior During Therapy: WFL for tasks assessed/performed Overall Cognitive Status: Within Functional Limits for tasks assessed                                        General Comments      Exercises     Assessment/Plan    PT Assessment Patient needs continued PT services  PT Problem List Decreased strength;Decreased activity tolerance;Decreased balance;Decreased mobility       PT Treatment Interventions DME instruction;Gait training;Stair training;Functional mobility training;Therapeutic activities;Therapeutic exercise;Balance training;Patient/family education    PT Goals (Current goals can be found in the Care Plan section)  Acute Rehab PT Goals Patient Stated Goal: return home with family to assist PT Goal Formulation: With patient Time For Goal Achievement: 07/09/20 Potential to Achieve Goals: Good    Frequency Min 3X/week   Barriers to discharge        Co-evaluation PT/OT/SLP Co-Evaluation/Treatment: Yes Reason for Co-Treatment: For patient/therapist safety;To address functional/ADL transfers PT goals addressed during session: Mobility/safety with mobility;Balance;Proper use of DME         AM-PAC PT "6 Clicks" Mobility  Outcome Measure Help needed turning from your back to your side while in a flat bed without using bedrails?: None Help needed moving from lying on your back to sitting on the side of a flat  bed without using bedrails?: A Little Help needed moving to and from a bed to a chair (including a wheelchair)?: A Little Help needed standing up from a chair using your arms (e.g., wheelchair or bedside chair)?: A Little Help needed to walk in hospital room?: A Little Help needed climbing 3-5 steps with a railing? : A Lot 6 Click Score: 18    End of Session   Activity Tolerance: Patient tolerated treatment well;Patient limited by fatigue Patient left: in chair;with call bell/phone within reach;with chair alarm set Nurse Communication: Mobility status PT Visit Diagnosis: Unsteadiness on feet (R26.81);Other abnormalities of gait and mobility (R26.89);Muscle weakness (generalized) (M62.81)    Time: 6195-0932 PT Time Calculation (min) (ACUTE ONLY): 20 min   Charges:   PT Evaluation $PT Eval Moderate Complexity: 1 Mod PT Treatments $Therapeutic Activity: 8-22 mins        9:52 AM, 07/02/20 Lonell Grandchild, MPT Physical Therapist with [facility] [phone] office [phone] mobile phone

## 2020-07-02 NOTE — Care Management Obs Status (Signed)
[place] NOTIFICATION   Patient Details  Name: [name] MRN: [id number] Date of Birth: 05-Jan-1941   Medicare Observation Status Notification Given:  Yes    Boneta Lucks, RN 07/02/2020, 2:05 PM

## 2020-07-02 NOTE — Progress Notes (Signed)
Pt placed on home CPAP unit for bedtime

## 2020-07-02 NOTE — Plan of Care (Signed)
  Problem: Acute Rehab PT Goals(only PT should resolve) Goal: Pt Will Go Supine/Side To Sit Outcome: Progressing Flowsheets (Taken 07/02/2020 0953) Pt will go Supine/Side to Sit:  with modified independence  with supervision Goal: Patient Will Transfer Sit To/From Stand Outcome: Progressing Flowsheets (Taken 07/02/2020 [phone]) Patient will transfer sit to/from stand:  with modified independence  with supervision Goal: Pt Will Transfer Bed To Chair/Chair To Bed Outcome: Progressing Flowsheets (Taken 07/02/2020 0953) Pt will Transfer Bed to Chair/Chair to Bed: with supervision Goal: Pt Will Ambulate Outcome: Progressing Flowsheets (Taken 07/02/2020 0953) Pt will Ambulate:  100 feet  with supervision   9:54 AM, 07/02/20 Lonell Grandchild, MPT Physical Therapist with [facility] [phone] office [phone] mobile phone

## 2020-07-02 NOTE — Progress Notes (Signed)
This nurse completed skin assessment no abnormalities noted.

## 2020-07-02 NOTE — TOC Initial Note (Signed)
Transition of Care [facility]) - Initial/Assessment Note    Patient Details  Name: [name] MRN: [id number] Date of Birth: [birth year]  [facility] of [facility]) CM/SW Contact:    Boneta Lucks, RN Phone Number: 07/02/2020, 2:11 PM  Clinical Narrative:      Patient admitted in OBS with acute febrile illness. PT is recommending HHPT. Requesting [facility] with [facility] accepted the referral for HHPT.           Expected Discharge Plan: [place] Barriers to Discharge: Continued Medical Work up   Patient Goals and CMS Choice Patient states their goals for this hospitalization and ongoing recovery are:: to go home. CMS Medicare.gov Compare Post Acute Care list provided to:: Patient    Expected Discharge Plan and Services Expected Discharge Plan: [place]       Living arrangements for the past 2 months: [place]: PT [place]: [place] ([place]) Date [place]: 07/02/20 Time [place]: 2694 Representative spoke with at [place]: [place] Arrangements/Services Living arrangements for the past 2 months: [place] Lives with:: Spouse Patient language and need for interpreter reviewed:: Yes Do you feel safe going back to the place where you live?: Yes      Need for Family Participation in Patient Care: Yes (Comment) Care giver support system in place?: Yes (comment)   Criminal Activity/Legal Involvement Pertinent to Current Situation/Hospitalization: No - Comment as needed  Activities of Daily Living Home Assistive Devices/Equipment: None ADL Screening (condition at time of admission) Patient's cognitive ability adequate to safely complete daily activities?: Yes Is the patient deaf or have difficulty hearing?: Yes Does the patient have difficulty seeing, even when wearing glasses/contacts?: No Does the  patient have difficulty concentrating, remembering, or making decisions?: No Patient able to express need for assistance with ADLs?: No Does the patient have difficulty dressing or bathing?: Yes Independently performs ADLs?: Yes (appropriate for developmental age) (may need set up or stand by assistance) Does the patient have difficulty walking or climbing stairs?: Yes Weakness of Legs: Both Weakness of Arms/Hands: None  Permission Sought/Granted            Permission granted to share info w Relationship: wife     Emotional Assessment     Affect (typically observed): Accepting   Alcohol / Substance Use: Not Applicable Psych Involvement: No (comment)  Admission diagnosis:  General weakness [R53.1] Fever [R50.9] Acute febrile illness [R50.9] Thoracic aortic aneurysm without rupture (HCC) [I71.2] Non-intractable vomiting with nausea, unspecified vomiting type [R11.2] Patient Active Problem List   Diagnosis Date Noted   Hyperglycemia due to diabetes mellitus ([place]) 07/02/2020   Generalized weakness 07/02/2020   Thoracic ascending aortic aneurysm ([place]) 07/02/2020   Hyponatremia 07/02/2020   Obstructive sleep apnea 07/02/2020   Dementia ([place]) 07/02/2020   Dehydration 07/02/2020   Leukocytosis 07/02/2020   Acute febrile illness 07/01/2020   Subacute frontal sinusitis 06/20/2020   Severe aortic stenosis 12/04/2019   Unstable angina (HCC)    Nonrheumatic aortic valve stenosis    OAB (overactive bladder) 11/29/2018   Aftercare 09/26/2018   OA (osteoarthritis) of hip 07/05/2018   History of viral meningitis 02/28/2018   Aortic atherosclerosis ([place]) 12/19/2017   Word finding difficulty 12/14/2017   Pain of  left hip joint 11/30/2017   History of total hip arthroplasty 10/12/2017   Trochanteric bursitis 10/12/2017   BPH (benign prostatic hyperplasia) 07/29/2017   Cervical spondylosis 07/21/2017   Lumbar spinal stenosis 02/25/2017   Spinal stenosis of lumbar region 02/24/2017    Spinal stenosis at L4-L5 level 02/24/2017   Lumbar radicular pain 01/17/2017   Lumbar pain 01/17/2017   Microalbuminuria due to type 2 diabetes mellitus ([place]) 07/23/2016   Common migraine 12/23/2014   Hx of adenomatous colonic polyps 05/09/2014   Obesity (BMI 30-39.9) 07/30/2013   Type 2 diabetes mellitus with circulatory disorder ([place]) 05/27/2011   Hyperlipidemia associated with type 2 diabetes mellitus ([place]) 10/19/2008   Hypertension associated with diabetes ([place]) 10/19/2008   CORONARY ATHEROSCLEROSIS NATIVE CORONARY ARTERY 10/17/2008   GERD (gastroesophageal reflux disease) 12/27/2007   PCP:  Dettinger, Fransisca Kaufmann, MD Pharmacy:   [facility] [address], [place] [address] Phone: [phone] Fax: [phone]  [facility]  ([place], [place], [address] [place], [place] [address] Phone: [phone] Fax: [phone]  [facility] [place] (New Address) - [place], [place] AT Previously: [place], [address] [place] [address] Phone: [phone] Fax: [phone]     Social Determinants of Health (SDOH) Interventions    Readmission Risk Interventions No flowsheet data found.

## 2020-07-02 NOTE — H&P (Signed)
History and Physical  [name]:[id number] DOB: [birth year] DOA: 07/01/2020  Referring physician:  Elnora Morrison, MD PCP: Dettinger, Fransisca Kaufmann, MD  Patient coming from: Home  Chief Complaint: Generalized weakness  HPI: [name] is a 80 y.o. male with medical history significant for CAD status post and placement, T2DM, GERD, hypertension, hyperlipidemia, BPH, dementia and OSA on CPAP who presents to the emergency department due to 1 day onset of fatigue, fever, body aches, headache, vomiting and generalized weakness.  He was recently exposed to people with COVID while bear hunting in [place], he denies any tick bite, but was complaining of generalized weakness and nausea limiting his ambulation.  Patient had a virtual visit with his PCP who asked him to call EMS if symptoms worsen, so this resulted in patient calling EMS.  He was already vaccinated against COVID and also already had a booster.  ED Course:  In the emergency department, he was reported to be febrile, BP was elevated at 164/83.  Work-up in the ED showed mild leukocytosis and normocytic anemia, BMP showed hyponatremia and hyperglycemia, troponin x2 was negative, lactic acid x2 was negative.  BNP was 51.  Influenza A, B, SARS coronavirus 2 was negative. CT of chest, abdomen and pelvis with contrast showed stable ascending thoracic aortic aneurysm 4.3 cm diameter, but showed no acute abnormalities. Chest x-ray showed limited evaluation due to low lung volumes and lordotic positioning without evidence of acute cardiopulmonary disease. IV hydration was provided, Tylenol and ibuprofen were given.  Hospitalist was asked to admit patient for further evaluation and management.   Review of Systems: Constitutional: Positive for chills and fever.  HENT: Negative for ear pain and sore throat.   Eyes: Negative for pain and visual disturbance.  Respiratory: Positive for shortness of breath.  Negative for cough, chest  tightness.   Cardiovascular: Negative for chest pain and palpitations.  Gastrointestinal: Positive for nausea and vomiting.  Negative for abdominal pain  Endocrine: Negative for polyphagia and polyuria.  Genitourinary: Negative for decreased urine volume, dysuria, enuresis Musculoskeletal: Negative for arthralgias and back pain.  Skin: Negative for color change and rash.  Allergic/Immunologic: Negative for immunocompromised state.  Neurological: Positive for weakness.  Negative for tremors, syncope, speech difficulty, Hematological: Does not bruise/bleed easily.  All other systems reviewed and are negative  Past Medical History:  Diagnosis Date   Anxiety    Aortic atherosclerosis ([place]) 12/19/2017   Aortic stenosis    26 mm Edwards S3U THV November 2021   Arthritis    Bursitis of hip    Cervical spondylosis    Chronic headaches    Coronary atherosclerosis of native coronary artery    BMS nondominant RCA 12/2004, DES x2 to mid LAD 11/2019   Essential hypertension    GERD (gastroesophageal reflux disease)    History of adenomatous polyp of colon    History of kidney stones    History of MI (myocardial infarction) 12/2004   History of viral meningitis 02/28/2018   December 2019   Hyperlipidemia    Internal hemorrhoids    Low back pain    Nocturia more than twice per night    OSA (obstructive sleep apnea)    Spinal stenosis    Type 2 diabetes mellitus treated with insulin ([place])    Wears dentures    Upper   Past Surgical History:  Procedure Laterality Date   CARDIAC CATHETERIZATION  06-14-2004  dr Lia Foyer   total occlusion mD1, RI 30-40%, mRCA nondominant hazy  80% and scattered 30-40%,  ef 71% (medical mangement)   CARDIOVASCULAR STRESS TEST  09-17-2015   dr Domenic Polite   Low risk nuclear study w/ reversible mild small anteroapical defect,  normal LV function and wall motion, nuclear stress ef 61%   CATARACT EXTRACTION W/ INTRAOCULAR LENS IMPLANT Right 07/2014   COLONOSCOPY   09/10/2008   normal   CORONARY ANGIOPLASTY WITH STENT PLACEMENT  12-17-2004   dr benismhon/ dr Olevia Perches   nonobstructive cad LAD and LCFx,  BMS x1 to total occlusion RCA    CORONARY ATHERECTOMY  11/22/2019   CORONARY ATHERECTOMY N/A 11/22/2019   Procedure: CORONARY ATHERECTOMY;  Surgeon: Burnell Blanks, MD;  Location: [facility];  Service: Cardiovascular;  Laterality: N/A;   CYSTOSCOPY W/ URETEROSCOPY W/ LITHOTRIPSY  08/2010   ERCP  03/14/2008   EYE SURGERY Bilateral    cataracts   LAPAROSCOPIC CHOLECYSTECTOMY  05/2012   LEFT HEART CATHETERIZATION WITH CORONARY ANGIOGRAM N/A 05/14/2011   Procedure: LEFT HEART CATHETERIZATION WITH CORONARY ANGIOGRAM;  Surgeon: Sherren Mocha, MD;  Location: [facility];  Service: Cardiovascular;  Laterality: N/A;    non-obstructive LM, LAD, LCFx, patent RCA stent   LUMBAR LAMINECTOMY/DECOMPRESSION MICRODISCECTOMY N/A 02/24/2017   Procedure: Microlumbar decompression L4-5, L5-S1;  Surgeon: Susa Day, MD;  Location: [facility];  Service: Orthopedics;  Laterality: N/A;  120 mins   RIGHT/LEFT HEART CATH AND CORONARY ANGIOGRAPHY N/A 11/14/2019   Procedure: RIGHT/LEFT HEART CATH AND CORONARY ANGIOGRAPHY;  Surgeon: Burnell Blanks, MD;  Location: [facility];  Service: Cardiovascular;  Laterality: N/A;   ROTATOR CUFF REPAIR Right 03/2002   ROTATOR CUFF REPAIR Left 12/11/2015   TEE WITHOUT CARDIOVERSION N/A 12/04/2019   Procedure: TRANSESOPHAGEAL ECHOCARDIOGRAM (TEE);  Surgeon: Burnell Blanks, MD;  Location: [facility];  Service: Open Heart Surgery;  Laterality: N/A;   TOTAL HIP ARTHROPLASTY Right 12/20/2008   TOTAL HIP ARTHROPLASTY Left 07/05/2018   Procedure: TOTAL HIP ARTHROPLASTY ANTERIOR APPROACH;  Surgeon: Gaynelle Arabian, MD;  Location: [facility];  Service: Orthopedics;  Laterality: Left;  171min   TOTAL KNEE ARTHROPLASTY Bilateral left 12-11-2003/  right 07-31-2004   dr Wynelle Link [facility]   TRANSCATHETER AORTIC VALVE  REPLACEMENT, TRANSFEMORAL N/A 12/04/2019   Procedure: TRANSCATHETER AORTIC VALVE REPLACEMENT, TRANSFEMORAL;  Surgeon: Burnell Blanks, MD;  Location: [facility];  Service: Open Heart Surgery;  Laterality: N/A;   UPPER GASTROINTESTINAL ENDOSCOPY  01/08/2008   bx, inlet patch, duodenitis   YAG LASER APPLICATION Right 07/04/7626   Procedure: YAG LASER APPLICATION;  Surgeon: Williams Che, MD;  Location: [facility];  Service: Ophthalmology;  Laterality: Right;    Social History:  reports that he quit smoking about 31 years ago. His smoking use included cigarettes. He started smoking about 57 years ago. He has a 20.00 pack-year smoking history. He quit smokeless tobacco use about 31 years ago.  His smokeless tobacco use included chew. He reports that he does not drink alcohol and does not use drugs.   Allergies  Allergen Reactions   Ace Inhibitors Hives, Swelling and Rash    Rash,hives,tongue swelling   Angiotensin Receptor Blockers     Unknown reaction    Ciprofloxacin Other (See Comments)    confusion   Oxycodone-Acetaminophen Other (See Comments)    Unknown reaction   Robaxin [Methocarbamol] Other (See Comments)    Confusion    Toradol [Ketorolac Tromethamine] Other (See Comments)    confusion   Valium Other (See Comments)  Hallucinations; confusion   Doxycycline Hives and Rash    Family History  Problem Relation Age of Onset   Colon cancer Mother        Diagnosed age 51   Cancer Mother 88   Heart disease Father    Heart attack Father    Breast cancer Sister    Heart disease Brother    Brain cancer Sister    Heart disease Brother    Bladder Cancer Brother    Cancer - Other Brother      Prior to Admission medications   Medication Sig Start Date End Date Taking? Authorizing Provider  acetaminophen (TYLENOL) 650 MG CR tablet Take 1,300 mg by mouth 2 (two) times daily.    [provider]  ACETAMINOPHEN-BUTALBITAL 50-325 MG TABS Take by mouth.     [provider]  amLODipine (NORVASC) 10 MG tablet TAKE 1 TABLET BY MOUTH  DAILY 05/27/20   Eileen Stanford, PA-C  amoxicillin-clavulanate (AUGMENTIN) 875-125 MG tablet Take 1 tablet by mouth 2 (two) times daily. 06/20/20   Ivy Lynn, NP  aspirin EC 81 MG tablet Take 81 mg by mouth at bedtime.     [provider]  atorvastatin (LIPITOR) 40 MG tablet TAKE 1 TABLET BY MOUTH IN  THE EVENING 05/27/20   Eileen Stanford, PA-C  butalbital-acetaminophen-caffeine (FIORICET) [id number] MG tablet Take 1 tablet by mouth 2 (two) times daily as needed for headache.    [provider]  cetirizine (ZYRTEC) 10 MG tablet Take 10 mg by mouth daily with supper.     [provider]  Cholecalciferol (VITAMIN D3) 50 MCG (2000 UT) capsule Take 4,000 Units by mouth daily.     [provider]  Continuous Blood Gluc Receiver (DEXCOM G6 RECEIVER) DEVI USE TO CHECK BLOOD SUGAR UP TO 4 TIMES DAILY AS DIRECTED. 1 DEVICE PER YEAR. DX: E11.9 06/28/19   Dettinger, Fransisca Kaufmann, MD  Continuous Blood Gluc Sensor (DEXCOM G6 SENSOR) MISC USE TO CHECK BLOOD SUGAR UP TO 4 TIMES DAILY AS DIRECTED. CHANGE SENSOR EVERY 30 DAYS. DX: E11.9 06/28/19   Dettinger, Fransisca Kaufmann, MD  Continuous Blood Gluc Transmit (DEXCOM G6 TRANSMITTER) MISC USE TO CHECK BLOOD SUGAR UP TO 4 TIMES DAILY AS DIRECTED. CHANGE TRANSMITTER EVERY 3 MONTHS. DX: E11.9 06/28/19   Dettinger, Fransisca Kaufmann, MD  docusate sodium (COLACE) 100 MG capsule Take 100 mg by mouth daily as needed for mild constipation or moderate constipation.     [provider]  donepezil (ARICEPT) 5 MG tablet Take 1 tablet (5 mg total) by mouth at bedtime. 04/02/20   Dettinger, Fransisca Kaufmann, MD  famotidine (PEPCID) 20 MG tablet Take 1 tablet (20 mg total) by mouth 2 (two) times daily with a meal. 04/02/20   Dettinger, Fransisca Kaufmann, MD  furosemide (LASIX) 20 MG tablet TAKE 1 TABLET BY MOUTH  DAILY AS NEEDED 03/13/20   Burnell Blanks, MD  glucose blood  (ONETOUCH ULTRA) test strip Check BS TID Dx E11.59 02/05/20   Dettinger, Fransisca Kaufmann, MD  insulin glargine (LANTUS SOLOSTAR) 100 UNIT/ML Solostar Pen INJECT SUBCUTANEOUSLY 56 UNITS AT BEDTIME 04/03/20   Dettinger, Fransisca Kaufmann, MD  insulin lispro (HUMALOG) 100 UNIT/ML injection Inject 0-20 Units into the skin 3 (three) times daily as needed for high blood sugar.    [provider]  isosorbide mononitrate (IMDUR) 30 MG 24 hr tablet TAKE 1 TABLET BY MOUTH IN  THE EVENING 02/11/20   Satira Sark, MD  metFORMIN [facility])  500 MG 24 hr tablet Take 2 tablets (1,000 mg total) by mouth 2 (two) times daily. 04/02/20   Dettinger, Fransisca Kaufmann, MD  nitroGLYCERIN (NITROSTAT) 0.4 MG SL tablet DISSOLVE ONE TABLET UNDER THE TONGUE EVERY 5 MINUTES AS NEEDED FOR CHEST PAIN.  DO NOT EXCEED A TOTAL OF 3 DOSES IN 15 MINUTES 10/24/19   Satira Sark, MD  Omega-3 Fatty Acids (FISH OIL) 1200 MG CAPS Take 1,200-2,400 mg by mouth See admin instructions. Tale 1200 mg in the morning and 2400 mg in the evening    [provider]  PARoxetine (PAXIL) 20 MG tablet Take 1 tablet (20 mg total) by mouth daily. 04/02/20   Dettinger, Fransisca Kaufmann, MD  tamsulosin (FLOMAX) 0.4 MG CAPS capsule Take 1 capsule (0.4 mg total) by mouth daily. 04/02/20   Dettinger, Fransisca Kaufmann, MD  Turmeric Curcumin 500 MG CAPS Take 500 mg by mouth daily.    [provider]  vitamin B-12 (CYANOCOBALAMIN) 1000 MCG tablet Take 2,000 mcg by mouth daily.    [provider]    Physical Exam: BP (!) 172/78 (BP Location: Left Arm)   Pulse 70   Temp 98.2 F (36.8 C) (Oral)   Resp 20   Ht 5\' 6"  (1.676 m)   Wt 101.7 kg   SpO2 100%   BMI 36.19 kg/m   General: 80 y.o. year-old male well developed well nourished in no acute distress.  Alert and oriented x3. HEENT: Dry mucous membrane.  NCAT, EOMI Neck: Supple, trachea medial Cardiovascular: Regular rate and rhythm with no rubs or gallops.  No thyromegaly or JVD noted.  No lower  extremity edema. 2/4 pulses in all 4 extremities. Respiratory: Clear to auscultation with no wheezes or rales. Good inspiratory effort. Abdomen: Soft, nontender nondistended with normal bowel sounds x4 quadrants. Muskuloskeletal: No cyanosis, clubbing or edema noted bilaterally Neuro: CN II-XII intact, strength 5/5 x 4, sensation, reflexes intact Skin: No ulcerative lesions noted or rashes Psychiatry: Judgement and insight appear normal. Mood is appropriate for condition and setting          Labs on Admission:  Basic Metabolic Panel: Recent Labs  Lab 07/01/20 1227  NA 132*  K 3.9  CL 99  CO2 23  GLUCOSE 228*  BUN 13  CREATININE 0.89  CALCIUM 8.3*   Liver Function Tests: No results for input(s): AST, ALT, ALKPHOS, BILITOT, PROT, ALBUMIN in the last 168 hours. No results for input(s): LIPASE, AMYLASE in the last 168 hours. No results for input(s): AMMONIA in the last 168 hours. CBC: Recent Labs  Lab 07/01/20 1227  WBC 11.1*  NEUTROABS 8.8*  HGB 12.9*  HCT 40.9  MCV 88.3  PLT 333   Cardiac Enzymes: No results for input(s): CKTOTAL, CKMB, CKMBINDEX, TROPONINI in the last 168 hours.  BNP (last 3 results) Recent Labs    11/30/19 1411 07/01/20 1227  BNP 23.4 51.0    ProBNP (last 3 results) No results for input(s): PROBNP in the last 8760 hours.  CBG: No results for input(s): GLUCAP in the last 168 hours.  Radiological Exams on Admission: CT CHEST ABDOMEN PELVIS W CONTRAST  Result Date: 07/01/2020 CLINICAL DATA:  Abdominal pain and fever, history coronary artery disease post MI at type II diabetes mellitus, hypertension, GERD, former smoker EXAM: CT CHEST, ABDOMEN, AND PELVIS WITH CONTRAST TECHNIQUE: Multidetector CT imaging of the chest, abdomen and pelvis was performed following the standard protocol during bolus administration of intravenous contrast. Sagittal and coronal MPR images reconstructed from  axial data set. CONTRAST:  151mL OMNIPAQUE IOHEXOL 300 MG/ML  SOLN IV. No oral contrast administered. COMPARISON:  11/15/2019 FINDINGS: CT CHEST FINDINGS Cardiovascular: Atherosclerotic calcifications aorta, proximal great vessels and coronary arteries. Ascending thoracic aorta dilated 4.3 cm diameter unchanged. Prior TAVR. Heart otherwise unremarkable. No pericardial effusion. Pulmonary arteries grossly patent on non targeted exam. Mediastinum/Nodes: Base of cervical region normal appearance. Esophagus unremarkable. No thoracic adenopathy. Lungs/Pleura: Dependent atelectasis in the posterior lungs. Remaining lungs clear. No infiltrate, pleural effusion, or pneumothorax. Musculoskeletal: Mild degenerative disc disease changes thoracic spine. No acute osseous findings. CT ABDOMEN PELVIS FINDINGS Hepatobiliary: Gallbladder surgically absent. Liver normal appearance. Pancreas: Normal appearance Spleen: Normal appearance Adrenals/Urinary Tract: Adrenal glands normal appearance. BILATERAL renal cysts. Tiny nonobstructing LEFT renal calculus. Kidneys, ureters, and bladder otherwise normal appearance Stomach/Bowel: Normal appendix. Stomach and bowel loops normal appearance. Vascular/Lymphatic: Atherosclerotic calcifications aorta and iliac arteries without aneurysm. No adenopathy. Reproductive: Prostate gland and seminal vesicles obscured by beam hardening artifacts in pelvis Other: No free air or free fluid. No hernia or inflammatory process. Musculoskeletal: BILATERAL hip prostheses. No acute osseous findings. IMPRESSION: Stable ascending thoracic aortic aneurysm 4.3 cm diameter; Recommend annual imaging followup by CTA or MRA. This recommendation follows 2010 ACCF/AHA/AATS/ACR/ASA/SCA/SCAI/SIR/STS/SVM Guidelines for the Diagnosis and Management of Patients with Thoracic Aortic Disease. Circulation. 2010; 121: Y865-H846. Aortic aneurysm NOS (ICD10-I71.9) . Prior TAVR. BILATERAL renal cysts with tiny nonobstructing LEFT renal calculus. No acute abnormalities. Aortic Atherosclerosis  (ICD10-I70.0). Electronically Signed   By: Lavonia Dana M.D.   On: 07/01/2020 15:29   DG Chest Port 1 View  Result Date: 07/01/2020 CLINICAL DATA:  Shortness of breath. EXAM: PORTABLE CHEST 1 VIEW COMPARISON:  December 04, 2019. FINDINGS: Low lung volumes. Enlarged cardiac silhouette, accentuated by AP technique and low lung volumes. No visible pleural effusions or pneumothorax. Hazy bibasilar opacities likely represents accenuated diaphragmatic/mediastinal fat given lordotic positioning. IMPRESSION: 1. Limited evaluation due to low lung volumes and lordotic positioning without evidence of acute cardiopulmonary disease. Dedicated PA and lateral radiographs could better evaluate the lung bases if clinically indicated. 2. Cardiomegaly. Electronically Signed   By: Margaretha Sheffield MD   On: 07/01/2020 13:27    EKG: I independently viewed the EKG done and my findings are as followed: Normal sinus rhythm at a rate of 62 bpm  Assessment/Plan Present on Admission:  Acute febrile illness  CORONARY ATHEROSCLEROSIS NATIVE CORONARY ARTERY  GERD (gastroesophageal reflux disease)  Hyperlipidemia associated with type 2 diabetes mellitus ([place])  Hypertension associated with diabetes ([place])  BPH (benign prostatic hyperplasia)  Principal Problem:   Acute febrile illness Active Problems:   Hyperlipidemia associated with type 2 diabetes mellitus ([place])   Hypertension associated with diabetes ([place])   CORONARY ATHEROSCLEROSIS NATIVE CORONARY ARTERY   GERD (gastroesophageal reflux disease)   BPH (benign prostatic hyperplasia)   Hyperglycemia due to diabetes mellitus ([facility])   Generalized weakness   Thoracic ascending aortic aneurysm ([facility])   Hyponatremia   Obstructive sleep apnea   Dementia ([facility])   Dehydration   Leukocytosis   Generalized weakness in the setting of acute febrile illness This is possibly secondary to a viral illness Continue Tylenol as needed Continue Zofran as needed Continue supportive  measures Continue fall precaution and neurochecks Continue PT/OT eval and treat  Leukocytosis possibly reactive WBC 11.1; continue to monitor WBC with morning labs  Hyponatremia/dehydration Na 132, continue IV hydration  Thoracic aortic aneurysm CT chest, abdomen and pelvis with contrast showed stable ascending thoracic aortic aneurysm 4.3 cm diameter  Annual imaging follow-up by CTA or MRA recommended  CAD s/p post stent placement Continue aspirin, Lipitor, nitroglycerin, Imdur  GERD Continue famotidine  Essential hypertension Continue amlodipine, Imdur  Hyperlipidemia Continue Lipitor  Hyperglycemia secondary to T2DM Continue insulin sliding scale and hypoglycemia protocol Metformin will be held at this time  Obstructive sleep apnea on CPAP Continue CPAP  Dementia Continue Aricept   DVT prophylaxis: Lovenox  Code Status: Full code  Family Communication: None at bedside  Disposition Plan:  Patient is from:                        home Anticipated DC to:                   SNF or family members home Anticipated DC date:               2-3 days Anticipated DC barriers:          Patient requires inpatient management due to generalized weakness is an acute febrile illness  Consults called: None  Admission status: Observation    Bernadette Hoit MD [facility]  07/02/2020, 1:57 AM

## 2020-07-02 NOTE — Progress Notes (Signed)
Pt wife is at bedside  expressed concerned that pt is having the the same symptoms as before when pt had meningitis in 2019. MD notified of pt concern.MD ordered Meningitis work up.

## 2020-07-02 NOTE — Progress Notes (Signed)
Per radiology Lovenox injection will need to be held tonight in  order to have procedure tomorrow MD notified.

## 2020-07-02 NOTE — Progress Notes (Addendum)
PROGRESS NOTE    Patient: [name]                            PCP: Dettinger, Fransisca Kaufmann, MD                    DOB: 1940-11-23            DOA: 07/01/2020 WVP:[id number]             DOS: 07/02/2020, 10:29 AM   LOS: 0 days   Date of Service: The patient was seen and examined on 07/02/2020  Subjective:   The patient was seen and examined this morning. Hemodynamically stable, awake alert oriented X1 Remains pleasantly confused, not agitated Afebrile normotensive  Wife is concerned the symptoms are similar to previous meningitis infection.   Continue to complain of poor appetite, poor oral intake for nursing staff He did not eat his breakfast, he had only partial of his lunch.   Brief Narrative:   [name] is a 80 y.o. male with medical history significant for CAD status post and placement, T2DM, GERD, hypertension, hyperlipidemia, BPH, dementia and OSA on CPAP who presents to the emergency department due to 1 day onset of fatigue, fever, body aches, headache, vomiting and generalized weakness.  He was recently exposed to people with COVID while bear hunting in [place], he denies any tick bite, but was complaining of generalized weakness and nausea limiting his ambulation.  Patient had a virtual visit with his PCP who asked him to call EMS if symptoms worsen, so this resulted in patient calling EMS.  He was already vaccinated against COVID and also already had a booster.   ED Course:  In the emergency department, he was reported to be febrile, BP was elevated at 164/83.  Work-up in the ED showed mild leukocytosis and normocytic anemia, BMP showed hyponatremia and hyperglycemia, troponin x2 was negative, lactic acid x2 was negative.  BNP was 51.  Influenza A, B, SARS coronavirus 2 was negative. CT of chest, abdomen and pelvis with contrast showed stable ascending thoracic aortic aneurysm 4.3 cm diameter, but showed no acute abnormalities. Chest x-ray showed limited evaluation  due to low lung volumes and lordotic positioning without evidence of acute cardiopulmonary disease. IV hydration was provided, Tylenol and ibuprofen were given.    Assessment & Plan:   Principal Problem:   Acute febrile illness Active Problems:   Hyperlipidemia associated with type 2 diabetes mellitus ([place])   Hypertension associated with diabetes ([place])   CORONARY ATHEROSCLEROSIS NATIVE CORONARY ARTERY   GERD (gastroesophageal reflux disease)   BPH (benign prostatic hyperplasia)   Hyperglycemia due to diabetes mellitus (HCC)   Generalized weakness   Thoracic ascending aortic aneurysm (HCC)   Hyponatremia   Obstructive sleep apnea   Dementia (HCC)   Dehydration   Leukocytosis    Encephalopathy likely metabolic versus toxic metabolic  -Associated with headaches, denies of asymmetric weaknesses, excessively somnolent -We will continue with neurochecks, -Aspiration precaution -IV fluids -Due to history of previous viral meningitis, will pursue with obtaining a lumbar puncture -Patient has had Lovenox last night therefore has to be held for 24 hours before pursuing With holding Lovenox tonight, likely LP in a.m. CSF labs and cytology cultures has been ordered -According to the record patient is allergic to -According to record and patient's wife allergic to doxycycline  -Parikh PO Augmentin initiated  Poor p.o. pen take -Nursing staff reported  patient has had 0% of his breakfast, and only 15% of his lunch -Reporting of reduced appetite -We will continue with gentle IV fluid hydration -Once mental status improves, will encourage oral intake   Generalized weakness in the setting of acute febrile illness This is possibly secondary to a viral illness Patient has a history of viral meningitis in the past -Titer for Lyme disease and [place] spotted disease was sent -Empiric antibiotics Augmentin initiated Continue Tylenol as needed Continue Zofran as needed Continue  supportive measures Continue fall precaution and neurochecks Continue PT/OT consulted recommending home health   Consulting interventional radiology for lumbar puncture, with fluid analysis cultures and cytology   Leukocytosis possibly reactive (with history of previous viral meningitis) WBC 11.1; continue to monitor WBC with morning labs Empiric antibiotics Augmentin  Accelerated hypertension Continue home medication of amlodipine, Imdur Initiating labetalol 200 mg p.o. daily 07/02/2020 -As needed hydralazine     Hyponatremia/dehydration Na 132, continue IV hydration -Continue monitoring, especially encouraging p.o. intake  Thoracic aortic aneurysm /history of aortic valve replacement -Per patient also reporting h/o aortic valve replacement CT chest, abdomen and pelvis with contrast showed stable ascending thoracic aortic aneurysm 4.3 cm diameter Annual imaging follow-up by CTA or MRA recommended Stable  CAD s/p post stent placement Continue aspirin, Lipitor, nitroglycerin, Imdur Stable  GERD Continue famotidine Stable    Hyperlipidemia Continue Lipitor Stable  Hyperglycemia secondary to T2DM Continue insulin sliding scale and hypoglycemia protocol Metformin will be held at this time  Obstructive sleep apnea on CPAP Continue CPAP Stable  Dementia Continue Aricept      Admission status: Observation     ---------------------------------------------------------------------------------------------------------------------------------------------------- Cultures; Lyme titer, [place] spotted disease antibody  Antimicrobials: Empiric antibiotic-Augmentin   Consultants: None   ------------------------------------------------------------------------------------------------------------------------------------------------  DVT prophylaxis:  SCD/Compression stockings and Lovenox SQ Code Status:   Code Status: Full Code  Family Communication:  Discussed with his wife at bedside in detail The above findings and plan of care has been discussed with patient (and family)  in detail,  they expressed understanding and agreement of above. -Advance care planning has been discussed.   Admission status:   Status is: Inpatient  Patient meets the end patient criteria anticipated greater than 2 midnight stay for continued valuation of encephalopathy, possible meningitis, febrile illness, poor p.o. intake, dehydration, accelerated hypertension    Dispo: The patient is from: Home              Anticipated d/c is to: Home in 1-2 days with home health              Patient currently is not medically stable to d/c.  Persistent headache, poor p.o. intake, etc. Hypertension Needing IV fluid resuscitation, further evaluation and possible intervention, likely LP   Difficult to place patient No      Level of care: Telemetry   Procedures:   No admission procedures for hospital encounter.    Antimicrobials:  Anti-infectives (From admission, onward)    Start     Dose/Rate Route Frequency Ordered Stop   07/02/20 1400  amoxicillin-clavulanate (AUGMENTIN) 500-125 MG per tablet 500 mg        1 tablet Oral Every 8 hours 07/02/20 1026          Medication:   amLODipine  10 mg Oral Daily   amoxicillin-clavulanate  1 tablet Oral Q8H   aspirin EC  81 mg Oral QHS   atorvastatin  40 mg Oral QPM   donepezil  5 mg Oral QHS  enoxaparin (LOVENOX) injection  50 mg Subcutaneous Q24H   famotidine  20 mg Oral Daily   insulin aspart  0-9 Units Subcutaneous TID WC   insulin glargine  56 Units Subcutaneous QHS   isosorbide mononitrate  30 mg Oral QPM   labetalol  200 mg Oral BID   PARoxetine  20 mg Oral Daily   tamsulosin  0.4 mg Oral Daily   vitamin B-12  2,000 mcg Oral Daily    acetaminophen, butalbital-acetaminophen-caffeine, nitroGLYCERIN, ondansetron (ZOFRAN) IV   Objective:   Vitals:   07/01/20 2246 07/02/20 0252 07/02/20 0647  07/02/20 0932  BP:  (!) 166/59 (!) 174/66 (!) 185/81  Pulse:  72 74 75  Resp:  18 18 18   Temp:  98.1 F (36.7 C) (!) 97.5 F (36.4 C) 97.7 F (36.5 C)  TempSrc:  Oral Oral Oral  SpO2: 100% 91% 93% 93%  Weight:      Height:        Intake/Output Summary (Last 24 hours) at 07/02/2020 1029 Last data filed at 07/02/2020 0700 Gross per 24 hour  Intake 1500 ml  Output --  Net 1500 ml   Filed Weights   07/01/20 1231 07/01/20 2200  Weight: 102.1 kg 101.7 kg     Examination:   Physical Exam  Constitution: Excessively somnolent, but arousable, Psychiatric: Normal and stable mood and affect, cognition intact,   HEENT: Normocephalic, PERRL, otherwise with in Normal limits  Chest:Chest symmetric Cardio vascular:  S1/S2, RRR, No murmure, No Rubs or Gallops  pulmonary: Clear to auscultation bilaterally, respirations unlabored, negative wheezes / crackles Abdomen: Soft, non-tender, non-distended, bowel sounds,no masses, no organomegaly Muscular skeletal: Symmetric bilateral generalized weaknesses noted especially in lower extremities Limited exam - in bed, able to move all 4 extremities, ,  Neuro: CNII-XII intact. , normal motor and sensation, reflexes intact  Extremities: No pitting edema lower extremities, +2 pulses  Skin: Dry, warm to touch, negative for any Rashes, No open wounds Wounds: per nursing documentation    ------------------------------------------------------------------------------------------------------------------------------------------    LABs:  CBC Latest Ref Rng & Units 07/02/2020 07/01/2020 04/02/2020  WBC 4.0 - 10.5 K/uL 11.0(H) 11.1(H) 6.1  Hemoglobin 13.0 - 17.0 g/dL 14.7 12.9(L) 12.7(L)  Hematocrit 39.0 - 52.0 % 46.9 40.9 39.8  Platelets 150 - 400 K/uL 301 333 272   CMP Latest Ref Rng & Units 07/02/2020 07/01/2020 04/02/2020  Glucose 70 - 99 mg/dL 237(H) 228(H) 179(H)  BUN 8 - 23 mg/dL 12 13 16   Creatinine 0.61 - 1.24 mg/dL 0.83 0.89 0.99  Sodium 135 - 145  mmol/L 135 132(L) 139  Potassium 3.5 - 5.1 mmol/L 4.0 3.9 3.9  Chloride 98 - 111 mmol/L 101 99 103  CO2 22 - 32 mmol/L 26 23 20   Calcium 8.9 - 10.3 mg/dL 8.4(L) 8.3(L) 8.8  Total Protein 6.5 - 8.1 g/dL 7.3 - 6.8  Total Bilirubin 0.3 - 1.2 mg/dL 1.0 - 0.4  Alkaline Phos 38 - 126 U/L 74 - 78  AST 15 - 41 U/L 18 - 18  ALT 0 - 44 U/L 18 - 19       Micro Results Recent Results (from the past 240 hour(s))  Resp Panel by RT-PCR (Flu A&B, Covid) Nasopharyngeal Swab     Status: None   Collection Time: 07/01/20 12:28 PM   Specimen: Nasopharyngeal Swab; Nasopharyngeal(NP) swabs in vial transport medium  Result Value Ref Range Status   SARS Coronavirus 2 by RT PCR NEGATIVE NEGATIVE Final    Comment: (NOTE) SARS-CoV-2 target  nucleic acids are NOT DETECTED.  The SARS-CoV-2 RNA is generally detectable in upper respiratory specimens during the acute phase of infection. The lowest concentration of SARS-CoV-2 viral copies this assay can detect is 138 copies/mL. A negative result does not preclude SARS-Cov-2 infection and should not be used as the sole basis for treatment or other patient management decisions. A negative result may occur with  improper specimen collection/handling, submission of specimen other than nasopharyngeal swab, presence of viral mutation(s) within the areas targeted by this assay, and inadequate number of viral copies(<138 copies/mL). A negative result must be combined with clinical observations, patient history, and epidemiological information. The expected result is Negative.  Fact Sheet for Patients:  EntrepreneurPulse.com.au  Fact Sheet for Healthcare Providers:  IncredibleEmployment.be  This test is no t yet approved or cleared by the [place] FDA and  has been authorized for detection and/or diagnosis of SARS-CoV-2 by FDA under an Emergency Use Authorization (EUA). This EUA will remain  in effect (meaning this test can  be used) for the duration of the COVID-19 declaration under Section 564(b)(1) of the Act, 21 U.S.C.section 360bbb-3(b)(1), unless the authorization is terminated  or revoked sooner.       Influenza A by PCR NEGATIVE NEGATIVE Final   Influenza B by PCR NEGATIVE NEGATIVE Final    Comment: (NOTE) The Xpert Xpress SARS-CoV-2/FLU/RSV plus assay is intended as an aid in the diagnosis of influenza from Nasopharyngeal swab specimens and should not be used as a sole basis for treatment. Nasal washings and aspirates are unacceptable for Xpert Xpress SARS-CoV-2/FLU/RSV testing.  Fact Sheet for Patients: EntrepreneurPulse.com.au  Fact Sheet for Healthcare Providers: IncredibleEmployment.be  This test is not yet approved or cleared by the [place] FDA and has been authorized for detection and/or diagnosis of SARS-CoV-2 by FDA under an Emergency Use Authorization (EUA). This EUA will remain in effect (meaning this test can be used) for the duration of the COVID-19 declaration under Section 564(b)(1) of the Act, 21 U.S.C. section 360bbb-3(b)(1), unless the authorization is terminated or revoked.  Performed at [facility], [address] [place], [place] [address]   Culture, blood (routine x 2)     Status: None (Preliminary result)   Collection Time: 07/01/20 12:42 PM   Specimen: BLOOD RIGHT HAND  Result Value Ref Range Status   Specimen Description   Final    BLOOD RIGHT HAND BOTTLES DRAWN AEROBIC AND ANAEROBIC   Special Requests   Final    Blood Culture adequate volume Performed at [facility], [address] [place], [place] [address]    Culture PENDING  Incomplete   Report Status PENDING  Incomplete  Culture, blood (routine x 2)     Status: None (Preliminary result)   Collection Time: 07/01/20  1:00 PM   Specimen: BLOOD RIGHT WRIST  Result Value Ref Range Status   Specimen Description   Final    BLOOD RIGHT WRIST BOTTLES DRAWN AEROBIC AND  ANAEROBIC   Special Requests   Final    Blood Culture adequate volume Performed at [facility], [address], [place], [place] [address]    Culture PENDING  Incomplete   Report Status PENDING  Incomplete    Radiology Reports CT CHEST ABDOMEN PELVIS W CONTRAST  Result Date: 07/01/2020 CLINICAL DATA:  Abdominal pain and fever, history coronary artery disease post MI at type II diabetes mellitus, hypertension, GERD, former smoker EXAM: CT CHEST, ABDOMEN, AND PELVIS WITH CONTRAST TECHNIQUE: Multidetector CT imaging of the chest, abdomen  and pelvis was performed following the standard protocol during bolus administration of intravenous contrast. Sagittal and coronal MPR images reconstructed from axial data set. CONTRAST:  164mL OMNIPAQUE IOHEXOL 300 MG/ML SOLN IV. No oral contrast administered. COMPARISON:  11/15/2019 FINDINGS: CT CHEST FINDINGS Cardiovascular: Atherosclerotic calcifications aorta, proximal great vessels and coronary arteries. Ascending thoracic aorta dilated 4.3 cm diameter unchanged. Prior TAVR. Heart otherwise unremarkable. No pericardial effusion. Pulmonary arteries grossly patent on non targeted exam. Mediastinum/Nodes: Base of cervical region normal appearance. Esophagus unremarkable. No thoracic adenopathy. Lungs/Pleura: Dependent atelectasis in the posterior lungs. Remaining lungs clear. No infiltrate, pleural effusion, or pneumothorax. Musculoskeletal: Mild degenerative disc disease changes thoracic spine. No acute osseous findings. CT ABDOMEN PELVIS FINDINGS Hepatobiliary: Gallbladder surgically absent. Liver normal appearance. Pancreas: Normal appearance Spleen: Normal appearance Adrenals/Urinary Tract: Adrenal glands normal appearance. BILATERAL renal cysts. Tiny nonobstructing LEFT renal calculus. Kidneys, ureters, and bladder otherwise normal appearance Stomach/Bowel: Normal appendix. Stomach and bowel loops normal appearance. Vascular/Lymphatic: Atherosclerotic calcifications  aorta and iliac arteries without aneurysm. No adenopathy. Reproductive: Prostate gland and seminal vesicles obscured by beam hardening artifacts in pelvis Other: No free air or free fluid. No hernia or inflammatory process. Musculoskeletal: BILATERAL hip prostheses. No acute osseous findings. IMPRESSION: Stable ascending thoracic aortic aneurysm 4.3 cm diameter; Recommend annual imaging followup by CTA or MRA. This recommendation follows 2010 ACCF/AHA/AATS/ACR/ASA/SCA/SCAI/SIR/STS/SVM Guidelines for the Diagnosis and Management of Patients with Thoracic Aortic Disease. Circulation. 2010; 121: P710-G269. Aortic aneurysm NOS (ICD10-I71.9) . Prior TAVR. BILATERAL renal cysts with tiny nonobstructing LEFT renal calculus. No acute abnormalities. Aortic Atherosclerosis (ICD10-I70.0). Electronically Signed   By: Lavonia Dana M.D.   On: 07/01/2020 15:29   DG Chest Port 1 View  Result Date: 07/01/2020 CLINICAL DATA:  Shortness of breath. EXAM: PORTABLE CHEST 1 VIEW COMPARISON:  December 04, 2019. FINDINGS: Low lung volumes. Enlarged cardiac silhouette, accentuated by AP technique and low lung volumes. No visible pleural effusions or pneumothorax. Hazy bibasilar opacities likely represents accenuated diaphragmatic/mediastinal fat given lordotic positioning. IMPRESSION: 1. Limited evaluation due to low lung volumes and lordotic positioning without evidence of acute cardiopulmonary disease. Dedicated PA and lateral radiographs could better evaluate the lung bases if clinically indicated. 2. Cardiomegaly. Electronically Signed   By: Margaretha Sheffield MD   On: 07/01/2020 13:27    SIGNED: Deatra James, MD, FHM. [facility],  Pager (please use [email or web address] to page/text) Please use Epic Secure Chat for non-urgent communication (7AM-7PM)  If 7PM-7AM, please contact night-coverage [email or web address], 07/02/2020, 10:29 AM

## 2020-07-02 NOTE — Plan of Care (Signed)
  Problem: Acute Rehab OT Goals (only OT should resolve) Goal: Pt. Will Perform Grooming Flowsheets (Taken 07/02/2020 1213) Pt Will Perform Grooming: Independently Goal: Pt. Will Perform Lower Body Dressing Flowsheets (Taken 07/02/2020 1213) Pt Will Perform Lower Body Dressing:  with modified independence  sit to/from stand  sitting/lateral leans Goal: Pt. Will Transfer To Toilet Flowsheets (Taken 07/02/2020 1213) Pt Will Transfer to Toilet:  Independently  with modified independence  stand pivot transfer  ambulating Goal: Pt/Caregiver Will Perform Home Exercise Program Flowsheets (Taken 07/02/2020 1213) Pt/caregiver will Perform Home Exercise Program:  Increased strength  Both right and left upper extremity  Independently  Pearlie Nies OT, MOT

## 2020-07-03 ENCOUNTER — Inpatient Hospital Stay (HOSPITAL_COMMUNITY): Payer: Medicare Other

## 2020-07-03 ENCOUNTER — Encounter (HOSPITAL_COMMUNITY): Payer: Self-pay | Admitting: Family Medicine

## 2020-07-03 LAB — CSF CELL COUNT WITH DIFFERENTIAL
Eosinophils, CSF: 0 % (ref 0–1)
Eosinophils, CSF: 0 % (ref 0–1)
Lymphs, CSF: 80 % (ref 40–80)
Lymphs, CSF: 67 % (ref 40–80)
Monocyte-Macrophage-Spinal Fluid: 16 % (ref 15–45)
Monocyte-Macrophage-Spinal Fluid: 29 % (ref 15–45)
Other Cells, CSF: 4
Other Cells, CSF: 3
RBC Count, CSF: 106 /mm3 — ABNORMAL HIGH
RBC Count, CSF: 109 /mm3 — ABNORMAL HIGH
Segmented Neutrophils-CSF: 0 % (ref 0–6)
Segmented Neutrophils-CSF: 1 % (ref 0–6)
Tube #: 1
Tube #: 4
WBC, CSF: 200 /mm3 (ref 0–5)
WBC, CSF: 234 /mm3 (ref 0–5)

## 2020-07-03 LAB — GLUCOSE, CAPILLARY
Glucose-Capillary: 246 mg/dL — ABNORMAL HIGH (ref 70–99)
Glucose-Capillary: 346 mg/dL — ABNORMAL HIGH (ref 70–99)
Glucose-Capillary: 259 mg/dL — ABNORMAL HIGH (ref 70–99)

## 2020-07-03 LAB — PROTEIN AND GLUCOSE, CSF
Glucose, CSF: 147 mg/dL — ABNORMAL HIGH (ref 40–70)
Total  Protein, CSF: 101 mg/dL — ABNORMAL HIGH (ref 15–45)

## 2020-07-03 LAB — B. BURGDORFI ANTIBODIES

## 2020-07-03 LAB — MISC LABCORP TEST (SEND OUT): Labcorp test code: 164226

## 2020-07-03 MED ORDER — LORATADINE 10 MG PO TABS
10.0000 mg | ORAL_TABLET | Freq: Every day | ORAL | Status: DC
  Administered 2020-07-03 – 2020-07-07 (×5): 10 mg via ORAL
  Filled 2020-07-03 (×5): qty 1

## 2020-07-03 MED ORDER — LACTATED RINGERS IV SOLN: INTRAVENOUS | Status: DC

## 2020-07-03 MED ORDER — DEXTROSE 5 % IV SOLN
10.0000 mg/kg | Freq: Three times a day (TID) | INTRAVENOUS | Status: DC
  Administered 2020-07-03 – 2020-07-07 (×12): 790 mg via INTRAVENOUS
  Filled 2020-07-03 (×15): qty 15.8

## 2020-07-03 MED ORDER — SODIUM CHLORIDE 0.9 % IV SOLN
2.0000 g | Freq: Two times a day (BID) | INTRAVENOUS | Status: DC
  Administered 2020-07-03: 2 g via INTRAVENOUS
  Filled 2020-07-03 (×2): qty 20

## 2020-07-03 MED ORDER — ARTIFICIAL TEARS OPHTHALMIC OINT: TOPICAL_OINTMENT | OPHTHALMIC | Status: DC | PRN

## 2020-07-03 NOTE — Progress Notes (Signed)
ID chart check   80 yo male admitted for sepsis/metabolic encephalopathy  Myalgia/fatigue and fever main sx No tick bite No sick contact 2 yra ago had similar presentation and dxed with viral meningitis  W/u so far no lft elevation, thrombocytopenia Bcx ngtd Urine cx ecoli  Clinically improving   Placed on amox-clav for possible sinusitis and switched to ceftriaxone today for urine cx o f ecoli   Csf 200 wbc lymphocytic Lyme ab/rmsf ab sent Csf cx ngtd Vdrl in process Hsv pcr csf in process    A/p Sepsis Metabolic encephalopathy Csf pleocytosis  Sx consistent with viral process. Ddx incompletely treated bacterial process vs fungal/tb or potentially early tick process   Clinically improving And other objectives suggest a viral process  2 years ago? Simila r viral process. ?mollaret?  ?uti unclear but already on tx   - empiric iv acyclovir for cns process of hav - reasonable 5 more days of abx for possible uti, narrow to cephalexin if cx sensitive. [place]ril with po cefdinir for now - f/u csf micro work up - stop acyclovir if hsv pcr negative   Please call id if any unwanted evolution  Prudencio Pair, MD [facility]

## 2020-07-03 NOTE — Progress Notes (Signed)
Pharmacy Antibiotic Note  [name] is a 80 y.o. male admitted on 07/01/2020 with  possible viral meningitis .  Pharmacy has been consulted for acyclovir dosing.  Plan: Acyclovir 790 mg IV every 8 hours using ABW. Ceftriaxone 2000 mg IV every 12 hours. Monitor labs, c/s, and patient improvement.  Height: 5\' 6"  (167.6 cm) Weight: 101.7 kg (224 lb 3.3 oz) IBW/kg (Calculated) : 63.8  Temp (24hrs), Avg:97.7 F (36.5 C), Min:97.6 F (36.4 C), Max:97.8 F (36.6 C)  Recent Labs  Lab 07/01/20 1227 07/01/20 1444 07/02/20 0612 07/02/20 1338  WBC 11.1*  --  11.0* 11.3*  CREATININE 0.89  --  0.83 0.86  LATICACIDVEN 1.4 1.7  --   --     Estimated Creatinine Clearance: 76.6 mL/min (by C-G formula based on SCr of 0.86 mg/dL).    Allergies  Allergen Reactions   Ace Inhibitors Hives, Swelling and Rash    Rash,hives,tongue swelling   Angiotensin Receptor Blockers     Unknown reaction    Ciprofloxacin Other (See Comments)    confusion   Oxycodone-Acetaminophen Other (See Comments)    Unknown reaction   Robaxin [Methocarbamol] Other (See Comments)    Confusion    Toradol [Ketorolac Tromethamine] Other (See Comments)    confusion   Valium Other (See Comments)    Hallucinations; confusion   Doxycycline Hives and Rash    Antimicrobials this admission: Acyclovir 6/23 >>  CTX 6/23 >>  Augmentin 6/22 >> 6/23  Microbiology results: 6/21 BCx: pending 6/21 UCx: E.coli  Lumbar Puncture: pending  Thank you for allowing pharmacy to be a part of this patient's care.  Ramond Craver 07/03/2020 2:57 PM

## 2020-07-03 NOTE — Progress Notes (Signed)
Inpatient Diabetes Program Recommendations  AACE/ADA: New Consensus Statement on Inpatient Glycemic Control  Target Ranges:  Prepandial:   less than 140 mg/dL      Peak postprandial:   less than 180 mg/dL (1-2 hours)      Critically ill patients:  140 - 180 mg/dL   Results for [name], [name] "RONNIE" (MRN [id number]) as of 07/03/2020 07:59  Ref. Range 07/02/2020 08:15 07/02/2020 11:52 07/02/2020 16:18 07/03/2020 07:57  Glucose-Capillary Latest Ref Range: 70 - 99 mg/dL 225 (H) 251 (H) 258 (H) 246 (H)    Review of Glycemic Control  Diabetes history: DM2 Outpatient Diabetes medications: Lantus 56 units QHS, Humalog 15-20 units TID with meals, Metformin XR 1000 mg BID Current orders for Inpatient glycemic control: Lantus 56 units QHS, Novolog 0-9 units TID with meals  Inpatient Diabetes Program Recommendations:    Insulin: Please consider ordering Novolog 6 units TID with meals for meal coverage if patient eats at least 50% of meals.  Thanks, Barnie Alderman, RN, MSN, CDE Diabetes Coordinator Inpatient Diabetes Program [phone] (Team Pager from 8am to 5pm)

## 2020-07-03 NOTE — Progress Notes (Signed)
PT Cancellation Note  Patient Details Name: [name] MRN: [id number] DOB: 1940/04/29   Cancelled Treatment:    Reason Eval/Treat Not Completed: Patient at procedure or test/unavailable.  Patient going off floor for a procedure.   10:24 AM, 07/03/20 Lonell Grandchild, MPT Physical Therapist with [facility] 336 [phone] office [phone] mobile phone

## 2020-07-03 NOTE — Progress Notes (Addendum)
PROGRESS NOTE    Patient: [name]                            PCP: Dettinger, Fransisca Kaufmann, MD                    DOB: Mar 01, 1940            DOA: 07/01/2020 AUQ:[id number]             DOS: 07/03/2020, 11:25 AM   LOS: 1 day   Date of Service: The patient was seen and examined on 07/03/2020  Subjective:   Patient was seen and examined this morning, much more awake alert, no complaints.  Denies any headaches visual change or asymmetric weaknesses. No issues overnight, Wife present at bedside confirms that his mentation is back to baseline  Lovenox has been discontinued, he is scheduled for lumbar puncture today  Informed consent wife and the patient verbally was taken.   Brief Narrative:   [name] is a 80 y.o. male with medical history significant for CAD status post and placement, T2DM, GERD, hypertension, hyperlipidemia, BPH, dementia and OSA on CPAP who presents to the emergency department due to 1 day onset of fatigue, fever, body aches, headache, vomiting and generalized weakness.  He was recently exposed to people with COVID while bear hunting in [place], he denies any tick bite, but was complaining of generalized weakness and nausea limiting his ambulation.  Patient had a virtual visit with his PCP who asked him to call EMS if symptoms worsen, so this resulted in patient calling EMS.  He was already vaccinated against COVID and also already had a booster.   ED Course:  In the emergency department, he was reported to be febrile, BP was elevated at 164/83.  Work-up in the ED showed mild leukocytosis and normocytic anemia, BMP showed hyponatremia and hyperglycemia, troponin x2 was negative, lactic acid x2 was negative.  BNP was 51.  Influenza A, B, SARS coronavirus 2 was negative. CT of chest, abdomen and pelvis with contrast showed stable ascending thoracic aortic aneurysm 4.3 cm diameter, but showed no acute abnormalities. Chest x-ray showed limited evaluation due to low  lung volumes and lordotic positioning without evidence of acute cardiopulmonary disease. IV hydration was provided, Tylenol and ibuprofen were given.    Assessment & Plan:   Principal Problem:   Acute febrile illness Active Problems:   Hyperlipidemia associated with type 2 diabetes mellitus ([facility])   Hypertension associated with diabetes ([place])   CORONARY ATHEROSCLEROSIS NATIVE CORONARY ARTERY   GERD (gastroesophageal reflux disease)   BPH (benign prostatic hyperplasia)   Hyperglycemia due to diabetes mellitus (HCC)   Generalized weakness   Thoracic ascending aortic aneurysm (HCC)   Hyponatremia   Obstructive sleep apnea   Dementia (HCC)   Dehydration   Leukocytosis   Encephalopathy    Encephalopathy likely metabolic versus toxic metabolic  -Improved mentation today awake alert oriented x4, no new focal neurologic findings, -We will continue with neurochecks--- mentation is much improved -Aspiration precaution -IV fluids... will D/C today   -Due to history of previous viral meningitis, will pursue with obtaining a lumbar puncture -Patient has had Lovenox last night therefore has to be held for 24 hours before pursuing -  holding Lovenox,  - Schedule lumbar puncture today 07/03/2020  CSF labs and cytology cultures has been ordered -According to the record patient is allergic to -According to record and  patient's wife allergic to doxycycline  -PO Augmentin initiated  Addendum: Status post LP 07/03/2020, bowels revealing > WBC, lymphocyte Discussed with ID ID Dr. Gale Journey was remotely consulted from [facility] Per our discussion findings are consistent with a viral meningitis He is recommending empiric IV acyclovir for now   Poor p.o. pen take -Much more awake, still complaining of poor appetite, encouraging p.o. intake -Reporting of reduced appetite -We will continue with gentle IV fluid hydration -Once mental status improves, will encourage oral intake   Generalized  weakness in the setting of acute febrile illness This is possibly secondary to a viral illness Patient has a history of viral meningitis in the past -Titer for Lyme disease and [place] spotted disease was sent -Empiric antibiotics Augmentin initiated Continue Tylenol as needed Continue Zofran as needed Continue supportive measures Continue fall precaution and neurochecks Continue PT/OT consulted recommending home health   Consulting interventional radiology for lumbar puncture, with fluid analysis cultures and cytology   Leukocytosis possibly reactive (with history of previous viral meningitis) WBC 11.1; continue to monitor WBC with morning labs Empiric antibiotics Augmentin  Accelerated hypertension Continue home medication of amlodipine, Imdur Initiating labetalol 200 mg p.o. daily 07/02/2020 -As needed hydralazine   Hyponatremia/dehydration Na 132, 131  continue IV hydration -Continue monitoring, especially encouraging p.o. intake  Thoracic aortic aneurysm /history of aortic valve replacement -Per patient also reporting h/o aortic valve replacement CT chest, abdomen and pelvis with contrast showed stable ascending thoracic aortic aneurysm 4.3 cm diameter Annual imaging follow-up by CTA or MRA recommended Stable   CAD s/p post stent placement Continue aspirin, Lipitor, nitroglycerin, Imdur Stable   GERD Continue famotidine Stable    Hyperlipidemia Continue Lipitor Stable  Hyperglycemia secondary to T2DM Continue insulin sliding scale and hypoglycemia protocol Metformin will be held at this time  Obstructive sleep apnea on CPAP Continue CPAP Stable  Dementia Continue Aricept      Admission status: Observation     ---------------------------------------------------------------------------------------------------------------------------------------------------- Cultures; Lyme titer, [place] spotted disease antibody  Antimicrobials: Empiric  antibiotic-Augmentin   Consultants: None   ------------------------------------------------------------------------------------------------------------------------------------------------  DVT prophylaxis:  SCD/Compression stockings and Lovenox SQ Code Status:   Code Status: Full Code  Family Communication: Discussed with his wife at bedside in detail The above findings and plan of care has been discussed with patient (and family)  in detail,  they expressed understanding and agreement of above. -Advance care planning has been discussed.   Admission status:   Status is: Inpatient  Patient meets the end patient criteria anticipated greater than 2 midnight stay for continued valuation of encephalopathy, possible meningitis, febrile illness, poor p.o. intake, dehydration, accelerated hypertension    Dispo: The patient is from: Home              Anticipated d/c is to: Home in 1-2 days with home health              Patient currently is not medically stable to d/c.  Persistent headache, poor p.o. intake, etc. Hypertension Needing IV fluid resuscitation, further evaluation and possible intervention, likely LP   Difficult to place patient No      Level of care: Telemetry   Procedures:   No admission procedures for hospital encounter.    Antimicrobials:  Anti-infectives (From admission, onward)    Start     Dose/Rate Route Frequency Ordered Stop   07/02/20 1400  amoxicillin-clavulanate (AUGMENTIN) 500-125 MG per tablet 500 mg        1 tablet Oral  Every 8 hours 07/02/20 1026          Medication:   acetaminophen  650 mg Oral BID   amLODipine  10 mg Oral Daily   amoxicillin-clavulanate  1 tablet Oral Q8H   [START ON 07/04/2020] aspirin EC  81 mg Oral QHS   atorvastatin  40 mg Oral QPM   donepezil  5 mg Oral QHS   [START ON 07/04/2020] enoxaparin (LOVENOX) injection  50 mg Subcutaneous Q24H   famotidine  20 mg Oral Daily   insulin aspart  0-9 Units Subcutaneous TID WC    insulin glargine  56 Units Subcutaneous QHS   isosorbide mononitrate  30 mg Oral QPM   labetalol  200 mg Oral BID   PARoxetine  20 mg Oral Daily   tamsulosin  0.4 mg Oral Daily   vitamin B-12  2,000 mcg Oral Daily    butalbital-acetaminophen-caffeine, ibuprofen, nitroGLYCERIN, ondansetron (ZOFRAN) IV, polyvinyl alcohol   Objective:   Vitals:   07/02/20 2140 07/03/20 0503 07/03/20 1005 07/03/20 1045  BP: 136/64 (!) 146/68 (!) 129/104   Pulse: 72 66 (!) 57 68  Resp: 18 18  18   Temp: 97.6 F (36.4 C) 97.8 F (36.6 C)    TempSrc: Oral     SpO2: 95% 97%  100%  Weight:      Height:        Intake/Output Summary (Last 24 hours) at 07/03/2020 1125 Last data filed at 07/03/2020 0900 Gross per 24 hour  Intake 926.99 ml  Output --  Net 926.99 ml   Filed Weights   07/01/20 1231 07/01/20 2200  Weight: 102.1 kg 101.7 kg     Examination:      Physical Exam:   General:  Much more awake alert oriented x4 today,  HEENT:  Normocephalic, PERRL, otherwise with in Normal limits   Neuro:  CNII-XII intact. , normal motor and sensation, reflexes intact   Lungs:   Clear to auscultation BL, Respirations unlabored, no wheezes / crackles  Cardio:    S1/S2, RRR, No murmure, No Rubs or Gallops   Abdomen:   Soft, non-tender, bowel sounds active all four quadrants,  no guarding or peritoneal signs.  Muscular skeletal:  Limited exam - in bed, able to move all 4 extremities, Normal strength,  2+ pulses,  symmetric, No pitting edema  Skin:  Dry, warm to touch, negative for any Rashes,  Wounds: Please see nursing documentation          ------------------------------------------------------------------------------------------------------------------------------------------    LABs:  CBC Latest Ref Rng & Units 07/02/2020 07/02/2020 07/01/2020  WBC 4.0 - 10.5 K/uL 11.3(H) 11.0(H) 11.1(H)  Hemoglobin 13.0 - 17.0 g/dL 13.6 14.7 12.9(L)  Hematocrit 39.0 - 52.0 % 43.2 46.9 40.9  Platelets 150  - 400 K/uL 274 301 333   CMP Latest Ref Rng & Units 07/02/2020 07/02/2020 07/01/2020  Glucose 70 - 99 mg/dL 313(H) 237(H) 228(H)  BUN 8 - 23 mg/dL 14 12 13   Creatinine 0.61 - 1.24 mg/dL 0.86 0.83 0.89  Sodium 135 - 145 mmol/L 131(L) 135 132(L)  Potassium 3.5 - 5.1 mmol/L 3.7 4.0 3.9  Chloride 98 - 111 mmol/L 100 101 99  CO2 22 - 32 mmol/L 23 26 23   Calcium 8.9 - 10.3 mg/dL 8.4(L) 8.4(L) 8.3(L)  Total Protein 6.5 - 8.1 g/dL 7.0 7.3 -  Total Bilirubin 0.3 - 1.2 mg/dL 0.7 1.0 -  Alkaline Phos 38 - 126 U/L 74 74 -  AST 15 - 41 U/L 18 18 -  ALT 0 - 44 U/L 18 18 -       Micro Results Recent Results (from the past 240 hour(s))  Resp Panel by RT-PCR (Flu A&B, Covid) Nasopharyngeal Swab     Status: None   Collection Time: 07/01/20 12:28 PM   Specimen: Nasopharyngeal Swab; Nasopharyngeal(NP) swabs in vial transport medium  Result Value Ref Range Status   SARS Coronavirus 2 by RT PCR NEGATIVE NEGATIVE Final    Comment: (NOTE) SARS-CoV-2 target nucleic acids are NOT DETECTED.  The SARS-CoV-2 RNA is generally detectable in upper respiratory specimens during the acute phase of infection. The lowest concentration of SARS-CoV-2 viral copies this assay can detect is 138 copies/mL. A negative result does not preclude SARS-Cov-2 infection and should not be used as the sole basis for treatment or other patient management decisions. A negative result may occur with  improper specimen collection/handling, submission of specimen other than nasopharyngeal swab, presence of viral mutation(s) within the areas targeted by this assay, and inadequate number of viral copies(<138 copies/mL). A negative result must be combined with clinical observations, patient history, and epidemiological information. The expected result is Negative.  Fact Sheet for Patients:  EntrepreneurPulse.com.au  Fact Sheet for Healthcare Providers:  IncredibleEmployment.be  This test is no t  yet approved or cleared by the [place] FDA and  has been authorized for detection and/or diagnosis of SARS-CoV-2 by FDA under an Emergency Use Authorization (EUA). This EUA will remain  in effect (meaning this test can be used) for the duration of the COVID-19 declaration under Section 564(b)(1) of the Act, 21 U.S.C.section 360bbb-3(b)(1), unless the authorization is terminated  or revoked sooner.       Influenza A by PCR NEGATIVE NEGATIVE Final   Influenza B by PCR NEGATIVE NEGATIVE Final    Comment: (NOTE) The Xpert Xpress SARS-CoV-2/FLU/RSV plus assay is intended as an aid in the diagnosis of influenza from Nasopharyngeal swab specimens and should not be used as a sole basis for treatment. Nasal washings and aspirates are unacceptable for Xpert Xpress SARS-CoV-2/FLU/RSV testing.  Fact Sheet for Patients: EntrepreneurPulse.com.au  Fact Sheet for Healthcare Providers: IncredibleEmployment.be  This test is not yet approved or cleared by the [place] FDA and has been authorized for detection and/or diagnosis of SARS-CoV-2 by FDA under an Emergency Use Authorization (EUA). This EUA will remain in effect (meaning this test can be used) for the duration of the COVID-19 declaration under Section 564(b)(1) of the Act, 21 U.S.C. section 360bbb-3(b)(1), unless the authorization is terminated or revoked.  Performed at [facility], [address] [place], [place] [address]   Culture, blood (routine x 2)     Status: None (Preliminary result)   Collection Time: 07/01/20 12:42 PM   Specimen: BLOOD RIGHT HAND  Result Value Ref Range Status   Specimen Description   Final    BLOOD RIGHT HAND BOTTLES DRAWN AEROBIC AND ANAEROBIC   Special Requests   Final    Blood Culture adequate volume Performed at [facility], [address], [place], [place] [address]    Culture PENDING  Incomplete   Report Status PENDING  Incomplete  Culture, blood  (routine x 2)     Status: None (Preliminary result)   Collection Time: 07/01/20  1:00 PM   Specimen: BLOOD RIGHT WRIST  Result Value Ref Range Status   Specimen Description   Final    BLOOD RIGHT WRIST BOTTLES DRAWN AEROBIC AND ANAEROBIC   Special Requests   Final    Blood  Culture adequate volume Performed at [facility], [address] [place], [place] [address]    Culture PENDING  Incomplete   Report Status PENDING  Incomplete  Urine culture     Status: Abnormal (Preliminary result)   Collection Time: 07/01/20  2:17 PM   Specimen: Urine, Clean Catch  Result Value Ref Range Status   Specimen Description   Final    URINE, CLEAN CATCH Performed at [facility], [address] [place], [place] [address]    Special Requests   Final    NONE Performed at [facility], [address] [place], [place] [address]    Culture (A)  Final    >=100,000 COLONIES/mL ESCHERICHIA COLI CULTURE REINCUBATED FOR BETTER GROWTH Performed at [facility], [place] [address], [place], [place] [address]    Report Status PENDING  Incomplete  Culture, blood (routine x 2)     Status: None (Preliminary result)   Collection Time: 07/02/20 12:07 PM   Specimen: BLOOD  Result Value Ref Range Status   Specimen Description BLOOD BLOOD LEFT HAND  Final   Special Requests   Final    BOTTLES DRAWN AEROBIC AND ANAEROBIC Blood Culture adequate volume Performed at [facility], [place] [address] [place], [place] [address]    Culture PENDING  Incomplete   Report Status PENDING  Incomplete  Culture, blood (routine x 2)     Status: None (Preliminary result)   Collection Time: 07/02/20 12:07 PM   Specimen: BLOOD  Result Value Ref Range Status   Specimen Description BLOOD BLOOD LEFT WRIST  Final   Special Requests   Final    BOTTLES DRAWN AEROBIC AND ANAEROBIC Blood Culture results may not be optimal due to an inadequate volume of blood received in culture bottles Performed at [facility], [place] [address], [place], [place] [address]    Culture PENDING  Incomplete   Report Status PENDING  Incomplete    Radiology Reports CT CHEST ABDOMEN PELVIS W CONTRAST  Result Date: 07/01/2020 CLINICAL DATA:  Abdominal pain and fever, history coronary artery disease post MI at type II diabetes mellitus, hypertension, GERD, former smoker EXAM: CT CHEST, ABDOMEN, AND PELVIS WITH CONTRAST TECHNIQUE: Multidetector CT imaging of the chest, abdomen and pelvis was performed following the standard protocol during bolus administration of intravenous contrast. Sagittal and coronal MPR images reconstructed from axial data set. CONTRAST:  144mL OMNIPAQUE IOHEXOL 300 MG/ML SOLN IV. No oral contrast administered. COMPARISON:  11/15/2019 FINDINGS: CT CHEST FINDINGS Cardiovascular: Atherosclerotic calcifications aorta, proximal great vessels and coronary arteries. Ascending thoracic aorta dilated 4.3 cm diameter unchanged. Prior TAVR. Heart otherwise unremarkable. No pericardial effusion. Pulmonary arteries grossly patent on non targeted exam. Mediastinum/Nodes: Base of cervical region normal appearance. Esophagus unremarkable. No thoracic adenopathy. Lungs/Pleura: Dependent atelectasis in the posterior lungs. Remaining lungs clear. No infiltrate, pleural effusion, or pneumothorax. Musculoskeletal: Mild degenerative disc disease changes thoracic spine. No acute osseous findings. CT ABDOMEN PELVIS FINDINGS Hepatobiliary: Gallbladder surgically absent. Liver normal appearance. Pancreas: Normal appearance Spleen: Normal appearance Adrenals/Urinary Tract: Adrenal glands normal appearance. BILATERAL renal cysts. Tiny nonobstructing LEFT renal calculus. Kidneys, ureters, and bladder otherwise normal appearance Stomach/Bowel: Normal appendix. Stomach and bowel loops normal appearance. Vascular/Lymphatic: Atherosclerotic calcifications aorta and iliac arteries without aneurysm. No adenopathy. Reproductive:  Prostate gland and seminal vesicles obscured by beam hardening artifacts in pelvis Other: No free air or free fluid. No hernia or inflammatory process. Musculoskeletal: BILATERAL hip prostheses. No acute osseous findings.  IMPRESSION: Stable ascending thoracic aortic aneurysm 4.3 cm diameter; Recommend annual imaging followup by CTA or MRA. This recommendation follows 2010 ACCF/AHA/AATS/ACR/ASA/SCA/SCAI/SIR/STS/SVM Guidelines for the Diagnosis and Management of Patients with Thoracic Aortic Disease. Circulation. 2010; 121: L935-T017. Aortic aneurysm NOS (ICD10-I71.9) . Prior TAVR. BILATERAL renal cysts with tiny nonobstructing LEFT renal calculus. No acute abnormalities. Aortic Atherosclerosis (ICD10-I70.0). Electronically Signed   By: Lavonia Dana M.D.   On: 07/01/2020 15:29   DG Chest Port 1 View  Result Date: 07/01/2020 CLINICAL DATA:  Shortness of breath. EXAM: PORTABLE CHEST 1 VIEW COMPARISON:  December 04, 2019. FINDINGS: Low lung volumes. Enlarged cardiac silhouette, accentuated by AP technique and low lung volumes. No visible pleural effusions or pneumothorax. Hazy bibasilar opacities likely represents accenuated diaphragmatic/mediastinal fat given lordotic positioning. IMPRESSION: 1. Limited evaluation due to low lung volumes and lordotic positioning without evidence of acute cardiopulmonary disease. Dedicated PA and lateral radiographs could better evaluate the lung bases if clinically indicated. 2. Cardiomegaly. Electronically Signed   By: Margaretha Sheffield MD   On: 07/01/2020 13:27    SIGNED: Deatra James, MD, FHM. [facility],  Pager (please use [email or web address] to page/text) Please use Epic Secure Chat for non-urgent communication (7AM-7PM)  If 7PM-7AM, please contact night-coverage [email or web address], 07/03/2020, 11:25 AM

## 2020-07-03 NOTE — Procedures (Signed)
Preprocedure Dx: AMS Postprocedure Dx: AMS Procedure:  Fluoroscopically guided lumbar puncture Radiologist:  Thornton Papas Anesthesia:  4 ml of 1% lidocaine Specimen:  9 ml CSF, clear colorless EBL:   < 1 ml Opening pressure: 12 cm H2O (measured prone) Complications: None

## 2020-07-03 NOTE — Sedation Documentation (Signed)
Labs (CSF fluid) prepared and sent to lab at this time.

## 2020-07-03 NOTE — Sedation Documentation (Signed)
PT tolerated lumbar puncture procedure well today and returned to inpatient bed assignment at this time. Report given to Joellen Jersey, Therapist, sports. PT verbalized understanding of post procedure instructions. No acute distress noted.

## 2020-07-03 NOTE — Progress Notes (Signed)
Patient CBG taken at 21:00 with value of 323. Glucometer did not upload to [facility] results view. Patient given scheduled lantus per order.

## 2020-07-04 LAB — GLUCOSE, CAPILLARY
Glucose-Capillary: 221 mg/dL — ABNORMAL HIGH (ref 70–99)
Glucose-Capillary: 397 mg/dL — ABNORMAL HIGH (ref 70–99)
Glucose-Capillary: 248 mg/dL — ABNORMAL HIGH (ref 70–99)
Glucose-Capillary: 289 mg/dL — ABNORMAL HIGH (ref 70–99)

## 2020-07-04 LAB — BASIC METABOLIC PANEL
Anion gap: 6 (ref 5–15)
BUN: 13 mg/dL (ref 8–23)
CO2: 27 mmol/L (ref 22–32)
Calcium: 8.5 mg/dL — ABNORMAL LOW (ref 8.9–10.3)
Chloride: 104 mmol/L (ref 98–111)
Creatinine, Ser: 0.97 mg/dL (ref 0.61–1.24)
GFR, Estimated: >60 mL/min (ref >60)
Glucose, Bld: 241 mg/dL — ABNORMAL HIGH (ref 70–99)
Potassium: 3.5 mmol/L (ref 3.5–5.1)
Sodium: 137 mmol/L (ref 135–145)

## 2020-07-04 LAB — VDRL, CSF: VDRL Quant, CSF: NONREACTIVE

## 2020-07-04 LAB — MISC LABCORP TEST (SEND OUT): Labcorp test code: 164226

## 2020-07-04 LAB — URINE CULTURE: Culture: >=100000 — AB

## 2020-07-04 LAB — B. BURGDORFI ANTIBODIES

## 2020-07-04 LAB — CYTOLOGY - NON PAP

## 2020-07-04 MED ORDER — INSULIN GLARGINE 100 UNIT/ML ~~LOC~~ SOLN
60.0000 [IU] | Freq: Every day | SUBCUTANEOUS | Status: DC
  Administered 2020-07-04 – 2020-07-06 (×3): 60 [IU] via SUBCUTANEOUS
  Filled 2020-07-04 (×4): qty 0.6

## 2020-07-04 MED ORDER — CEPHALEXIN 500 MG PO CAPS
500.0000 mg | ORAL_CAPSULE | Freq: Three times a day (TID) | ORAL | Status: DC
  Administered 2020-07-04 – 2020-07-05 (×3): 500 mg via ORAL
  Filled 2020-07-04 (×3): qty 1

## 2020-07-04 NOTE — Progress Notes (Signed)
Inpatient Diabetes Program Recommendations  AACE/ADA: New Consensus Statement on Inpatient Glycemic Control   Target Ranges:  Prepandial:   less than 140 mg/dL      Peak postprandial:   less than 180 mg/dL (1-2 hours)      Critically ill patients:  140 - 180 mg/dL  Results for [name], [name] "RONNIE" (MRN [id number]) as of 07/04/2020 07:44  Ref. Range 07/04/2020 06:20  Glucose Latest Ref Range: 70 - 99 mg/dL 241 (H)   Results for [name], [name] (MRN [id number]) as of 07/04/2020 07:44  Ref. Range 07/03/2020 07:57 07/03/2020 11:22 07/03/2020 16:49  Glucose-Capillary Latest Ref Range: 70 - 99 mg/dL 246 (H) 346 (H) 259 (H)    Review of Glycemic Control  Diabetes history: DM2 Outpatient Diabetes medications: Lantus 56 units QHS, Humalog 15-20 units TID with meals, Metformin XR 1000 mg BID Current orders for Inpatient glycemic control: Lantus 56 units QHS, Novolog 0-9 units TID with meals   Inpatient Diabetes Program Recommendations:     Insulin: Please consider increasing Lantus to 60 units QHS, adding Novolog 0-5 units QHS, and ordering Novolog 5 units TID with meals for meal coverage if patient eats at least 50% of meals.   Thanks, Barnie Alderman, RN, MSN, CDE Diabetes Coordinator Inpatient Diabetes Program [phone] (Team Pager from 8am to 5pm)

## 2020-07-04 NOTE — Progress Notes (Signed)
PROGRESS NOTE    Patient: [name]                            PCP: Dettinger, [name] Kaufmann, MD                    DOB: 10/05/1940            DOA: 07/01/2020 ZOX:[id number]             DOS: 07/04/2020, 10:23 AM   LOS: 2 days   Date of Service: The patient was seen and examined on 07/04/2020  Subjective:   The patient was seen and examined this morning, still somnolent, easily arousable.  Denies any headaches visual change or asymmetric weaknesses.  Tolerated lumbar puncture which was completed, pulmonary report elevated WBC and CSF Per ID recommendation on IV acyclovir till RSV cultures come back discussed with patient and wife at bedside they expressed understanding of the plan.   Brief Narrative:   [name] is a 80 y.o. male with medical history significant for CAD status post and placement, T2DM, GERD, hypertension, hyperlipidemia, BPH, dementia and OSA on CPAP who presents to the emergency department due to 1 day onset of fatigue, fever, body aches, headache, vomiting and generalized weakness.  He was recently exposed to people with COVID while bear hunting in [place], he denies any tick bite, but was complaining of generalized weakness and nausea limiting his ambulation.  Patient had a virtual visit with his PCP who asked him to call EMS if symptoms worsen, so this resulted in patient calling EMS.  He was already vaccinated against COVID and also already had a booster.   ED Course:  In the emergency department, he was reported to be febrile, BP was elevated at 164/83.  Work-up in the ED showed mild leukocytosis and normocytic anemia, BMP showed hyponatremia and hyperglycemia, troponin x2 was negative, lactic acid x2 was negative.  BNP was 51.  Influenza A, B, SARS coronavirus 2 was negative. CT of chest, abdomen and pelvis with contrast showed stable ascending thoracic aortic aneurysm 4.3 cm diameter, but showed no acute abnormalities. Chest x-ray showed limited evaluation  due to low lung volumes and lordotic positioning without evidence of acute cardiopulmonary disease. IV hydration was provided, Tylenol and ibuprofen were given.    Assessment & Plan:   Principal Problem:   Acute febrile illness Active Problems:   Hyperlipidemia associated with type 2 diabetes mellitus (HCC)   Hypertension associated with diabetes ([place])   CORONARY ATHEROSCLEROSIS NATIVE CORONARY ARTERY   GERD (gastroesophageal reflux disease)   BPH (benign prostatic hyperplasia)   Hyperglycemia due to diabetes mellitus (HCC)   Generalized weakness   Thoracic ascending aortic aneurysm (HCC)   Hyponatremia   Obstructive sleep apnea   Dementia (HCC)   Dehydration   Leukocytosis   Encephalopathy    Encephalopathy likely metabolic versus toxic metabolic  -Continue improved mentation, no focal neurological findings denies any headaches visual changes -Status post lumbar puncture on 07/03/2020-pending cultures fluid analysis, pulmonary report reporting elevated WBC -Infectious disease Dr. Gale [name] was consulted recommended IV acyclovir until HSV results Otherwise pulmonary CSF report is consistent with viral meningitis  -We will continue with neurochecks--- mentation is much improved -Aspiration precaution -IV fluids... As patient will remain on IV acyclovir -Due to history of previous viral meningitis, will pursue with obtaining a lumbar puncture -Lovenox has been on hold for LP, will be resumed today  CSF labs and cytology cultures has been ordered -According to the record patient is allergic to -According to record and patient's wife allergic to doxycycline  -   Urine tract infection  -Urine culture revealing E. coli, pending sensitivity -Patient has been on antibiotics of Augmentin but switched to Rocephin, per ID recommendation will be switched to p.o. Keflex today 07/04/2020  poor p.o. intake -Mentation improving, encouraging p.o. intake -Continue IV fluid  Generalized  weakness in the setting of acute febrile illness This is possibly secondary to a viral illness Patient has a history of viral meningitis in the past -Titer for Lyme disease and [place] spotted disease was sent -Empiric antibiotics Augmentin initiated Continue Tylenol as needed Continue Zofran as needed Continue supportive measures Continue fall precaution and neurochecks Continue PT/OT consulted recommending home health   Consulting interventional radiology for lumbar puncture, with fluid analysis cultures and cytology   Accelerated hypertension Continue home medication of amlodipine, Imdur Initiating labetalol 200 mg p.o. daily 07/02/2020 -As needed hydralazine -Monitoring BP closely  Hyponatremia/dehydration Na 132, 131 >> 137   continue IV hydration -Continue monitoring, especially encouraging p.o. intake  Thoracic aortic aneurysm /history of aortic valve replacement -Per patient also reporting h/o aortic valve replacement CT chest, abdomen and pelvis with contrast showed stable ascending thoracic aortic aneurysm 4.3 cm diameter Annual imaging follow-up by CTA or MRA recommended Stable   CAD s/p post stent placement Continue aspirin, Lipitor, nitroglycerin, Imdur Stable  GERD Continue famotidine Stable    Hyperlipidemia Continue Lipitor   Hyperglycemia secondary to T2DM Continue insulin sliding scale and hypoglycemia protocol Anticipating increasing Lantus to 60 units nightly Metformin will be held at this time  Obstructive sleep apnea on CPAP Continue CPAP Stable  Dementia Continue Aricept      Admission status: Observation     ---------------------------------------------------------------------------------------------------------------------------------------------------- Cultures; Lyme titer, [place] spotted disease antibody  Antimicrobials: Empiric antibiotic-Augmentin   Consultants:  None   ------------------------------------------------------------------------------------------------------------------------------------------------  DVT prophylaxis:  SCD/Compression stockings and Lovenox SQ Code Status:   Code Status: Full Code  Family Communication: Discussed with his wife at bedside in detail The above findings and plan of care has been discussed with patient (and family)  in detail,  they expressed understanding and agreement of above. -Advance care planning has been discussed.   Admission status:   Status is: Inpatient  Patient meets the end patient criteria anticipated greater than 2 midnight stay for continued valuation of encephalopathy, possible meningitis, febrile illness, poor p.o. intake, dehydration, accelerated hypertension    Dispo: The patient is from: Home              Anticipated d/c is to: Home in 1-2 days with home health              Patient currently is not medically stable to d/c.  Persistent headache, poor p.o. intake, etc. Hypertension Needing IV fluid resuscitation, further evaluation and possible intervention, likely LP   Difficult to place patient No      Level of care: Telemetry   Procedures:   No admission procedures for hospital encounter.    Antimicrobials:  Anti-infectives (From admission, onward)    Start     Dose/Rate Route Frequency Ordered Stop   07/03/20 2000  cefTRIAXone (ROCEPHIN) 2 g in sodium chloride 0.9 % 100 mL IVPB  Status:  Discontinued        2 g 200 mL/hr over 30 Minutes Intravenous Every 12 hours 07/03/20 1437 07/04/20 0956   07/03/20 1545  acyclovir (ZOVIRAX) 790 mg in dextrose 5 % 150 mL IVPB        10 mg/kg  79 kg (Adjusted) 165.8 mL/hr over 60 Minutes Intravenous Every 8 hours 07/03/20 1451     07/02/20 1400  amoxicillin-clavulanate (AUGMENTIN) 500-125 MG per tablet 500 mg  Status:  Discontinued        1 tablet Oral Every 8 hours 07/02/20 1026 07/03/20 1437        Medication:    acetaminophen  650 mg Oral BID   amLODipine  10 mg Oral Daily   aspirin EC  81 mg Oral QHS   atorvastatin  40 mg Oral QPM   donepezil  5 mg Oral QHS   enoxaparin (LOVENOX) injection  50 mg Subcutaneous Q24H   famotidine  20 mg Oral Daily   insulin aspart  0-9 Units Subcutaneous TID WC   insulin glargine  56 Units Subcutaneous QHS   isosorbide mononitrate  30 mg Oral QPM   labetalol  200 mg Oral BID   loratadine  10 mg Oral Daily   PARoxetine  20 mg Oral Daily   tamsulosin  0.4 mg Oral Daily   vitamin B-12  2,000 mcg Oral Daily    butalbital-acetaminophen-caffeine, ibuprofen, nitroGLYCERIN, ondansetron (ZOFRAN) IV, polyvinyl alcohol   Objective:   Vitals:   07/03/20 1045 07/03/20 1900 07/04/20 0412 07/04/20 0910  BP:  136/62 (!) 161/66 (!) 165/54  Pulse: 68 78 63 63  Resp: 18 16 18    Temp:  98 F (36.7 C) 98.8 F (37.1 C)   TempSrc:  Oral    SpO2: 100% 94% 98% 97%  Weight:      Height:        Intake/Output Summary (Last 24 hours) at 07/04/2020 1023 Last data filed at 07/04/2020 0700 Gross per 24 hour  Intake 1051.89 ml  Output 800 ml  Net 251.89 ml   Filed Weights   07/01/20 1231 07/01/20 2200  Weight: 102.1 kg 101.7 kg     Examination:        Physical Exam:   General:  Somnolent, easily arousable  HEENT:  Normocephalic, PERRL, otherwise with in Normal limits   Neuro:  CNII-XII intact. , normal motor and sensation, reflexes intact   Lungs:   Clear to auscultation BL, Respirations unlabored, no wheezes / crackles  Cardio:    S1/S2, RRR, No murmure, No Rubs or Gallops   Abdomen:   Soft, non-tender, bowel sounds active all four quadrants,  no guarding or peritoneal signs.  Muscular skeletal:  Limited exam - in bed, able to move all 4 extremities, Normal strength,  2+ pulses,  symmetric, No pitting edema  Skin:  Dry, warm to touch, negative for any Rashes,  Wounds: Please see nursing documentation          ------------------------------------------------------------------------------------------------------------------------------------------    LABs:  CBC Latest Ref Rng & Units 07/02/2020 07/02/2020 07/01/2020  WBC 4.0 - 10.5 K/uL 11.3(H) 11.0(H) 11.1(H)  Hemoglobin 13.0 - 17.0 g/dL 13.6 14.7 12.9(L)  Hematocrit 39.0 - 52.0 % 43.2 46.9 40.9  Platelets 150 - 400 K/uL 274 301 333   CMP Latest Ref Rng & Units 07/04/2020 07/02/2020 07/02/2020  Glucose 70 - 99 mg/dL 241(H) 313(H) 237(H)  BUN 8 - 23 mg/dL 13 14 12   Creatinine 0.61 - 1.24 mg/dL 0.97 0.86 0.83  Sodium 135 - 145 mmol/L 137 131(L) 135  Potassium 3.5 - 5.1 mmol/L 3.5 3.7 4.0  Chloride 98 - 111 mmol/L 104 100 101  CO2  22 - 32 mmol/L 27 23 26   Calcium 8.9 - 10.3 mg/dL 8.5(L) 8.4(L) 8.4(L)  Total Protein 6.5 - 8.1 g/dL - 7.0 7.3  Total Bilirubin 0.3 - 1.2 mg/dL - 0.7 1.0  Alkaline Phos 38 - 126 U/L - 74 74  AST 15 - 41 U/L - 18 18  ALT 0 - 44 U/L - 18 18       Micro Results Recent Results (from the past 240 hour(s))  Resp Panel by RT-PCR (Flu A&B, Covid) Nasopharyngeal Swab     Status: None   Collection Time: 07/01/20 12:28 PM   Specimen: Nasopharyngeal Swab; Nasopharyngeal(NP) swabs in vial transport medium  Result Value Ref Range Status   SARS Coronavirus 2 by RT PCR NEGATIVE NEGATIVE Final    Comment: (NOTE) SARS-CoV-2 target nucleic acids are NOT DETECTED.  The SARS-CoV-2 RNA is generally detectable in upper respiratory specimens during the acute phase of infection. The lowest concentration of SARS-CoV-2 viral copies this assay can detect is 138 copies/mL. A negative result does not preclude SARS-Cov-2 infection and should not be used as the sole basis for treatment or other patient management decisions. A negative result may occur with  improper specimen collection/handling, submission of specimen other than nasopharyngeal swab, presence of viral mutation(s) within the areas targeted by this assay, and inadequate  number of viral copies(<138 copies/mL). A negative result must be combined with clinical observations, patient history, and epidemiological information. The expected result is Negative.  Fact Sheet for Patients:  EntrepreneurPulse.com.au  Fact Sheet for Healthcare Providers:  IncredibleEmployment.be  This test is no t yet approved or cleared by the [place] FDA and  has been authorized for detection and/or diagnosis of SARS-CoV-2 by FDA under an Emergency Use Authorization (EUA). This EUA will remain  in effect (meaning this test can be used) for the duration of the COVID-19 declaration under Section 564(b)(1) of the Act, 21 U.S.C.section 360bbb-3(b)(1), unless the authorization is terminated  or revoked sooner.       Influenza A by PCR NEGATIVE NEGATIVE Final   Influenza B by PCR NEGATIVE NEGATIVE Final    Comment: (NOTE) The Xpert Xpress SARS-CoV-2/FLU/RSV plus assay is intended as an aid in the diagnosis of influenza from Nasopharyngeal swab specimens and should not be used as a sole basis for treatment. Nasal washings and aspirates are unacceptable for Xpert Xpress SARS-CoV-2/FLU/RSV testing.  Fact Sheet for Patients: EntrepreneurPulse.com.au  Fact Sheet for Healthcare Providers: IncredibleEmployment.be  This test is not yet approved or cleared by the [place] FDA and has been authorized for detection and/or diagnosis of SARS-CoV-2 by FDA under an Emergency Use Authorization (EUA). This EUA will remain in effect (meaning this test can be used) for the duration of the COVID-19 declaration under Section 564(b)(1) of the Act, 21 U.S.C. section 360bbb-3(b)(1), unless the authorization is terminated or revoked.  Performed at [facility], [address] [place], [place] [address]   Culture, blood (routine x 2)     Status: None (Preliminary result)   Collection Time: 07/01/20 12:42 PM    Specimen: BLOOD RIGHT HAND  Result Value Ref Range Status   Specimen Description   Final    BLOOD RIGHT HAND BOTTLES DRAWN AEROBIC AND ANAEROBIC   Special Requests Blood Culture adequate volume  Final   Culture   Final    NO GROWTH 3 DAYS Performed at [facility], [address] [place], [place] [address]    Report Status PENDING  Incomplete  Culture, blood (routine  x 2)     Status: None (Preliminary result)   Collection Time: 07/01/20  1:00 PM   Specimen: BLOOD RIGHT WRIST  Result Value Ref Range Status   Specimen Description   Final    BLOOD RIGHT WRIST BOTTLES DRAWN AEROBIC AND ANAEROBIC   Special Requests Blood Culture adequate volume  Final   Culture   Final    NO GROWTH 3 DAYS Performed at [facility], [address], [place], [place] [address]    Report Status PENDING  Incomplete  Urine culture     Status: Abnormal   Collection Time: 07/01/20  2:17 PM   Specimen: Urine, Clean Catch  Result Value Ref Range Status   Specimen Description   Final    URINE, CLEAN CATCH Performed at [facility], [address], [place], [place] [address]    Special Requests   Final    NONE Performed at [facility], [address] [place], [place] [address]    Culture >=100,000 COLONIES/mL ESCHERICHIA COLI (A)  Final   Report Status 07/04/2020 FINAL  Final   Organism ID, Bacteria ESCHERICHIA COLI (A)  Final      Susceptibility   Escherichia coli - MIC*    AMPICILLIN >=32 RESISTANT Resistant     CEFAZOLIN 8 SENSITIVE Sensitive     CEFEPIME <=0.12 SENSITIVE Sensitive     CEFTRIAXONE 0.5 SENSITIVE Sensitive     CIPROFLOXACIN <=0.25 SENSITIVE Sensitive     GENTAMICIN <=1 SENSITIVE Sensitive     IMIPENEM <=0.25 SENSITIVE Sensitive     NITROFURANTOIN <=16 SENSITIVE Sensitive     TRIMETH/SULFA <=20 SENSITIVE Sensitive     AMPICILLIN/SULBACTAM >=32 RESISTANT Resistant     PIP/TAZO 8 SENSITIVE Sensitive     * >=100,000 COLONIES/mL ESCHERICHIA COLI  Culture, blood (routine x 2)     Status:  None (Preliminary result)   Collection Time: 07/02/20 12:07 PM   Specimen: BLOOD  Result Value Ref Range Status   Specimen Description   Final    BLOOD BLOOD LEFT HAND Performed at [facility], [place] [address], [place], [place] [address]    Special Requests   Final    BOTTLES DRAWN AEROBIC AND ANAEROBIC Blood Culture adequate volume Performed at [facility], [place] [address] [place], [place] [address]    Culture   Final    NO GROWTH 2 DAYS Performed at [facility], [address] [place], [place] [address]    Report Status PENDING  Incomplete  Culture, blood (routine x 2)     Status: None (Preliminary result)   Collection Time: 07/02/20 12:07 PM   Specimen: BLOOD  Result Value Ref Range Status   Specimen Description   Final    BLOOD BLOOD LEFT WRIST Performed at [facility], [place] [address] [place], [place] [address]    Special Requests   Final    BOTTLES DRAWN AEROBIC AND ANAEROBIC Blood Culture results may not be optimal due to an inadequate volume of blood received in culture bottles Performed at [facility], [address] [place], [place] [address]    Culture   Final    NO GROWTH 2 DAYS Performed at [facility], [address], [place], [place] [address]    Report Status PENDING  Incomplete  CSF culture w Gram Stain     Status: None (Preliminary result)   Collection Time: 07/03/20 10:50 AM   Specimen: CSF; Cerebrospinal Fluid  Result Value  Ref Range Status   Specimen Description   Final    CSF Performed at [place] [address] [place], [place] [address]    Special Requests   Final    NONE Performed at [facility], [address], [place], [place] [address]    Gram Stain   Final    WBC PRESENT, PREDOMINANTLY MONONUCLEAR CYTOSPIN SMEAR Performed at [facility], [address] [place], [place] [address]    Culture   Final    NO GROWTH < 24  HOURS Performed at [place] [address], [place], [place] [address]    Report Status PENDING  Incomplete  Culture, fungus without smear     Status: None (Preliminary result)   Collection Time: 07/03/20 10:50 AM   Specimen: Lumbar Puncture; Cerebrospinal Fluid  Result Value Ref Range Status   Specimen Description   Final    LUMBAR Performed at [facility], [address] [place], [place] [address]    Special Requests   Final    NONE Performed at [facility], [address] [place], [place] [address]    Culture   Final    NO FUNGUS ISOLATED AFTER 1 DAY Performed at [facility], [place] [address] [place],  [address]    Report Status PENDING  Incomplete    Radiology Reports CT CHEST ABDOMEN PELVIS W CONTRAST  Result Date: 07/01/2020 CLINICAL DATA:  Abdominal pain and fever, history coronary artery disease post MI at type II diabetes mellitus, hypertension, GERD, former smoker EXAM: CT CHEST, ABDOMEN, AND PELVIS WITH CONTRAST TECHNIQUE: Multidetector CT imaging of the chest, abdomen and pelvis was performed following the standard protocol during bolus administration of intravenous contrast. Sagittal and coronal MPR images reconstructed from axial data set. CONTRAST:  136mL OMNIPAQUE IOHEXOL 300 MG/ML SOLN IV. No oral contrast administered. COMPARISON:  11/15/2019 FINDINGS: CT CHEST FINDINGS Cardiovascular: Atherosclerotic calcifications aorta, proximal great vessels and coronary arteries. Ascending thoracic aorta dilated 4.3 cm diameter unchanged. Prior TAVR. Heart otherwise unremarkable. No pericardial effusion. Pulmonary arteries grossly patent on non targeted exam. Mediastinum/Nodes: Base of cervical region normal appearance. Esophagus unremarkable. No thoracic adenopathy. Lungs/Pleura: Dependent atelectasis in the posterior lungs. Remaining lungs clear. No infiltrate, pleural effusion, or pneumothorax. Musculoskeletal: Mild degenerative disc disease changes  thoracic spine. No acute osseous findings. CT ABDOMEN PELVIS FINDINGS Hepatobiliary: Gallbladder surgically absent. Liver normal appearance. Pancreas: Normal appearance Spleen: Normal appearance Adrenals/Urinary Tract: Adrenal glands normal appearance. BILATERAL renal cysts. Tiny nonobstructing LEFT renal calculus. Kidneys, ureters, and bladder otherwise normal appearance Stomach/Bowel: Normal appendix. Stomach and bowel loops normal appearance. Vascular/Lymphatic: Atherosclerotic calcifications aorta and iliac arteries without aneurysm. No adenopathy. Reproductive: Prostate gland and seminal vesicles obscured by beam hardening artifacts in pelvis Other: No free air or free fluid. No hernia or inflammatory process. Musculoskeletal: BILATERAL hip prostheses. No acute osseous findings. IMPRESSION: Stable ascending thoracic aortic aneurysm 4.3 cm diameter; Recommend annual imaging followup by CTA or MRA. This recommendation follows 2010 ACCF/AHA/AATS/ACR/ASA/SCA/SCAI/SIR/STS/SVM Guidelines for the Diagnosis and Management of Patients with Thoracic Aortic Disease. Circulation. 2010; 121: W546-E703. Aortic aneurysm NOS (ICD10-I71.9) . Prior TAVR. BILATERAL renal cysts with tiny nonobstructing LEFT renal calculus. No acute abnormalities. Aortic Atherosclerosis (ICD10-I70.0). Electronically Signed   By: Lavonia Dana M.D.   On: 07/01/2020 15:29   DG Chest Port 1 View  Result Date: 07/01/2020 CLINICAL DATA:  Shortness of breath. EXAM: PORTABLE CHEST 1 VIEW COMPARISON:  December 04, 2019. FINDINGS: Low lung volumes. Enlarged cardiac  silhouette, accentuated by AP technique and low lung volumes. No visible pleural effusions or pneumothorax. Hazy bibasilar opacities likely represents accenuated diaphragmatic/mediastinal fat given lordotic positioning. IMPRESSION: 1. Limited evaluation due to low lung volumes and lordotic positioning without evidence of acute cardiopulmonary disease. Dedicated PA and lateral radiographs could  better evaluate the lung bases if clinically indicated. 2. Cardiomegaly. Electronically Signed   By: Margaretha Sheffield MD   On: 07/01/2020 13:27   DG FLUORO GUIDE LUMBAR PUNCTURE  Result Date: 07/03/2020 CLINICAL DATA:  Altered mental status, fever, history of meningitis EXAM: DIAGNOSTIC LUMBAR PUNCTURE UNDER FLUOROSCOPIC GUIDANCE COMPARISON:  None FLUOROSCOPY TIME:  Fluoroscopy Time:  0 minutes 18 seconds Radiation Exposure Index (if provided by the fluoroscopic device): 6.3 mGy Number of Acquired Spot Images: 1 PROCEDURE: Procedure, benefits, and risks were discussed with the patient, including alternatives. Patient's questions were answered. Written informed consent was obtained. Timeout protocol followed. Patient placed prone. Patient post L5 laminectomy. L4-L5 disc space was localized under fluoroscopy. Skin prepped and draped in usual sterile fashion. Skin and soft tissues anesthetized with 4 mL of 1% lidocaine. 22 gauge needle was advanced into the spinal canal where clear colorless CSF was encountered with an opening pressure of 12 cm H2O (measured prone not LLD). 9 mL of CSF was obtained in 4 tubes for requested analysis. Procedure tolerated very well by patient without immediate complication. IMPRESSION: Fluoroscopic guided lumbar puncture as above. Electronically Signed   By: Lavonia Dana M.D.   On: 07/03/2020 11:48    SIGNED: Deatra James, MD, FHM. [facility],  Pager (please use [email or web address] to page/text) Please use Epic Secure Chat for non-urgent communication (7AM-7PM)  If 7PM-7AM, please contact night-coverage [email or web address], 07/04/2020, 10:23 AM

## 2020-07-05 DIAGNOSIS — A879 Viral meningitis, unspecified: Secondary | ICD-10-CM | POA: Diagnosis present

## 2020-07-05 LAB — BASIC METABOLIC PANEL
Anion gap: 6 (ref 5–15)
BUN: 9 mg/dL (ref 8–23)
CO2: 28 mmol/L (ref 22–32)
Calcium: 8.3 mg/dL — ABNORMAL LOW (ref 8.9–10.3)
Chloride: 101 mmol/L (ref 98–111)
Creatinine, Ser: 0.95 mg/dL (ref 0.61–1.24)
GFR, Estimated: >60 mL/min (ref >60)
Glucose, Bld: 209 mg/dL — ABNORMAL HIGH (ref 70–99)
Potassium: 3.7 mmol/L (ref 3.5–5.1)
Sodium: 135 mmol/L (ref 135–145)

## 2020-07-05 LAB — GLUCOSE, CAPILLARY
Glucose-Capillary: 259 mg/dL — ABNORMAL HIGH (ref 70–99)
Glucose-Capillary: 323 mg/dL — ABNORMAL HIGH (ref 70–99)
Glucose-Capillary: 188 mg/dL — ABNORMAL HIGH (ref 70–99)
Glucose-Capillary: 270 mg/dL — ABNORMAL HIGH (ref 70–99)
Glucose-Capillary: 200 mg/dL — ABNORMAL HIGH (ref 70–99)
Glucose-Capillary: 306 mg/dL — ABNORMAL HIGH (ref 70–99)

## 2020-07-05 MED ORDER — NEOMYCIN-POLYMYXIN-DEXAMETH 3.5-10000-0.1 OP OINT
TOPICAL_OINTMENT | Freq: Three times a day (TID) | OPHTHALMIC | Status: DC
  Filled 2020-07-05: qty 3.5

## 2020-07-05 MED ORDER — CIPROFLOXACIN-DEXAMETHASONE 0.3-0.1 % OT SUSP
4.0000 [drp] | Freq: Two times a day (BID) | OTIC | Status: DC
  Filled 2020-07-05: qty 7.5

## 2020-07-05 MED ORDER — DIPHENHYDRAMINE HCL 50 MG/ML IJ SOLN
25.0000 mg | Freq: Four times a day (QID) | INTRAMUSCULAR | Status: DC | PRN
  Administered 2020-07-05: 25 mg via INTRAVENOUS
  Filled 2020-07-05: qty 1

## 2020-07-05 MED ORDER — SODIUM CHLORIDE 0.9 % IV SOLN: 25.0000 mg | INTRAVENOUS | Status: DC | PRN

## 2020-07-05 MED ORDER — CIPROFLOXACIN HCL 250 MG PO TABS
250.0000 mg | ORAL_TABLET | Freq: Two times a day (BID) | ORAL | Status: DC
  Administered 2020-07-05 – 2020-07-07 (×4): 250 mg via ORAL
  Filled 2020-07-05 (×5): qty 1

## 2020-07-05 MED ORDER — NAPHAZOLINE-GLYCERIN 0.012-0.25 % OP SOLN
1.0000 [drp] | Freq: Four times a day (QID) | OPHTHALMIC | Status: DC
Start: 2020-07-05 — End: 2020-07-07
  Administered 2020-07-05 (×3): 2 [drp] via OPHTHALMIC
  Administered 2020-07-06: 1 [drp] via OPHTHALMIC
  Administered 2020-07-06: 2 [drp] via OPHTHALMIC
  Filled 2020-07-05: qty 15

## 2020-07-05 MED ORDER — ACYCLOVIR SODIUM 50 MG/ML IV SOLN
INTRAVENOUS | Status: AC
  Filled 2020-07-05: qty 10

## 2020-07-05 MED ORDER — NAPHAZOLINE-GLYCERIN 0.012-0.25 % OP SOLN
1.0000 [drp] | Freq: Four times a day (QID) | OPHTHALMIC | Status: DC | PRN
  Filled 2020-07-05: qty 15

## 2020-07-05 NOTE — Progress Notes (Addendum)
PROGRESS NOTE    Patient: [name]                            PCP: Dettinger, Fransisca Kaufmann, MD                    DOB: 20-Jun-1940            DOA: 07/01/2020 QIH:[id number]             DOS: 07/05/2020, 4:47 PM   LOS: 3 days   Date of Service: The patient was seen and examined on 07/05/2020  Subjective:   The patient was seen and examined this morning, still somnolent, easily arousable.  Denies any headaches visual change or asymmetric weaknesses.  Tolerated lumbar puncture which was completed, pulmonary report elevated WBC and CSF Per ID recommendation on IV acyclovir till RSV cultures come back discussed with patient and wife at bedside they expressed understanding of the plan.   Brief Narrative:   [name] is a 80 y.o. male with medical history significant for CAD status post and placement, T2DM, GERD, hypertension, hyperlipidemia, BPH, dementia and OSA on CPAP who presents to the emergency department due to 1 day onset of fatigue, fever, body aches, headache, vomiting and generalized weakness.  He was recently exposed to people with COVID while bear hunting in [place], he denies any tick bite, but was complaining of generalized weakness and nausea limiting his ambulation.  Patient had a virtual visit with his PCP who asked him to call EMS if symptoms worsen, so this resulted in patient calling EMS.  He was already vaccinated against COVID and also already had a booster.   ED Course:  In the emergency department, he was reported to be febrile, BP was elevated at 164/83.  Work-up in the ED showed mild leukocytosis and normocytic anemia, BMP showed hyponatremia and hyperglycemia, troponin x2 was negative, lactic acid x2 was negative.  BNP was 51.  Influenza A, B, SARS coronavirus 2 was negative. CT of chest, abdomen and pelvis with contrast showed stable ascending thoracic aortic aneurysm 4.3 cm diameter, but showed no acute abnormalities. Chest x-ray showed limited evaluation  due to low lung volumes and lordotic positioning without evidence of acute cardiopulmonary disease. IV hydration was provided, Tylenol and ibuprofen were given.    Assessment & Plan:   Principal Problem:   Viral meningitis Active Problems:   Hyperlipidemia associated with type 2 diabetes mellitus ([place])   Hypertension associated with diabetes ([place])   CORONARY ATHEROSCLEROSIS NATIVE CORONARY ARTERY   GERD (gastroesophageal reflux disease)   BPH (benign prostatic hyperplasia)   Acute febrile illness   Hyperglycemia due to diabetes mellitus (HCC)   Generalized weakness   Thoracic ascending aortic aneurysm (HCC)   Hyponatremia   Obstructive sleep apnea   Dementia (HCC)   Dehydration   Leukocytosis   Encephalopathy    Encephalopathy likely metabolic versus toxic metabolic  CSF consistent with a viral meningitis -Mentation back to baseline -Status post lumbar puncture on 07/03/2020-pending cultures fluid analysis, elevated WBC -Infectious disease Dr. Gale Journey was consulted recommended IV acyclovir until HSV results Otherwise preliminary CSF report is consistent with viral meningitis  -We will continue with neurochecks--- mentation is much improved  -IV fluids... As patient will remain on IV acyclovir -Due to history of previous viral meningitis, will pursue with obtaining a lumbar puncture -Lovenox has been on hold for LP, will be resumed today  CSF  labs and cytology cultures has been ordered  Urine tract infection  -Urine culture revealing E. coli, pending sensitivity -Patient has been on antibiotics (tied Augmentin to Rocephin to Keflex)  -Patient reported itchiness with Keflex, switched to ciprofloxacin based on sensitivity  poor p.o. intake -Mentation improving, encouraging p.o. intake -IV fluid continue   Generalized weakness in the setting of acute febrile illness This is possibly secondary to a viral illness Patient has a history of viral meningitis in the past -Titer  for Lyme disease and [place] spotted disease was sent -Empiric antibiotics Augmentin initiated Continue Tylenol as needed Continue Zofran as needed Continue supportive measures Continue fall precaution and neurochecks Continue PT/OT consulted recommending home health   Consulting interventional radiology for lumbar puncture, with fluid analysis cultures and cytology   Accelerated hypertension Continue home medication of amlodipine, Imdur Initiating labetalol 200 mg p.o. daily 07/02/2020 -As needed hydralazine -Monitoring BP closely  Hyponatremia/dehydration Na 132, 131 >> 137   continue IV hydration -Continue monitoring, especially encouraging p.o. intake  Thoracic aortic aneurysm /history of aortic valve replacement -Per patient also reporting h/o aortic valve replacement CT chest, abdomen and pelvis with contrast showed stable ascending thoracic aortic aneurysm 4.3 cm diameter Annual imaging follow-up by CTA or MRA recommended Stable   CAD s/p post stent placement Continue aspirin, Lipitor, nitroglycerin, Imdur Stable  GERD Continue famotidine Stable  Hyperlipidemia Continue Lipitor  Hyperglycemia secondary to T2DM Continue insulin sliding scale and hypoglycemia protocol Anticipating increasing Lantus to 60 units nightly Metformin will be held at this time  Obstructive sleep apnea on CPAP Continue CPAP Stable  Dementia Continue Aricept     --------------------------------------------------------------------------------------------------------------------------------------------------- Cultures; Lyme titer, [place] spotted disease antibody LP: CSF cytology gradient 200 WBC, lymphocytes,... HSV, cultures pending Urine culture positive for E. coli, resistant to ampicillin,  Antimicrobials: IV Rocephin switched to p.o. ciprofloxacin   Consultants:  None   ------------------------------------------------------------------------------------------------------------------------------------------------  DVT prophylaxis:  SCD/Compression stockings and Lovenox SQ Code Status:   Code Status: Full Code  Family Communication: Discussed with his wife at bedside in detail The above findings and plan of care has been discussed with patient (and family)  in detail,  they expressed understanding and agreement of above. -Advance care planning has been discussed.   Admission status:   Status is: Inpatient  Patient meets the end patient criteria anticipated greater than 2 midnight stay for continued valuation of encephalopathy, possible meningitis, febrile illness, poor p.o. intake, dehydration, accelerated hypertension    Dispo: The patient is from: Home              Anticipated d/c is to: Home in 1-2 days with home health              Patient currently is not medically stable to d/c.  Persistent headache, poor p.o. intake, etc. Hypertension, Needing IV fluid resuscitation, IV acyclovir   Difficult to place patient No  Level of care: Telemetry   Procedures:   No admission procedures for hospital encounter.    Antimicrobials:  Anti-infectives (From admission, onward)    Start     Dose/Rate Route Frequency Ordered Stop   07/05/20 1500  ciprofloxacin (CIPRO) tablet 250 mg        250 mg Oral 2 times daily 07/05/20 1021     07/04/20 1400  cephALEXin (KEFLEX) capsule 500 mg  Status:  Discontinued        500 mg Oral Every 8 hours 07/04/20 1100 07/05/20 1016   07/03/20 2000  cefTRIAXone (  ROCEPHIN) 2 g in sodium chloride 0.9 % 100 mL IVPB  Status:  Discontinued        2 g 200 mL/hr over 30 Minutes Intravenous Every 12 hours 07/03/20 1437 07/04/20 0956   07/03/20 1545  acyclovir (ZOVIRAX) 790 mg in dextrose 5 % 150 mL IVPB        10 mg/kg  79 kg (Adjusted) 165.8 mL/hr over 60 Minutes Intravenous Every 8 hours 07/03/20 1451     07/02/20  1400  amoxicillin-clavulanate (AUGMENTIN) 500-125 MG per tablet 500 mg  Status:  Discontinued        1 tablet Oral Every 8 hours 07/02/20 1026 07/03/20 1437        Medication:   acetaminophen  650 mg Oral BID   amLODipine  10 mg Oral Daily   aspirin EC  81 mg Oral QHS   atorvastatin  40 mg Oral QPM   ciprofloxacin  250 mg Oral BID   donepezil  5 mg Oral QHS   enoxaparin (LOVENOX) injection  50 mg Subcutaneous Q24H   famotidine  20 mg Oral Daily   insulin aspart  0-9 Units Subcutaneous TID WC   insulin glargine  60 Units Subcutaneous QHS   isosorbide mononitrate  30 mg Oral QPM   labetalol  200 mg Oral BID   loratadine  10 mg Oral Daily   naphazoline-glycerin  1-2 drop Both Eyes QID   neomycin-polymyxin b-dexamethasone   Both Eyes TID   PARoxetine  20 mg Oral Daily   tamsulosin  0.4 mg Oral Daily   vitamin B-12  2,000 mcg Oral Daily    butalbital-acetaminophen-caffeine, diphenhydrAMINE, ibuprofen, nitroGLYCERIN, ondansetron (ZOFRAN) IV, polyvinyl alcohol   Objective:   Vitals:   07/04/20 2155 07/05/20 0026 07/05/20 0547 07/05/20 1414  BP: 140/73  (!) 148/69 (!) 160/67  Pulse: 70 61 (!) 59 65  Resp: 18 16 18 18   Temp: 98.2 F (36.8 C)  97.9 F (36.6 C) 97.9 F (36.6 C)  TempSrc: Oral  Oral Oral  SpO2: 94% 90% 95% 96%  Weight:      Height:        Intake/Output Summary (Last 24 hours) at 07/05/2020 1647 Last data filed at 07/05/2020 1300 Gross per 24 hour  Intake 960 ml  Output 1650 ml  Net -690 ml   Filed Weights   07/01/20 1231 07/01/20 2200  Weight: 102.1 kg 101.7 kg     Examination:     Physical Exam:   General:  Alert, oriented, cooperative, no distress;   HEENT:  BL conjunctival erythema ... No visible discharge,  normocephalic, PERRL, otherwise with in Normal limits   Neuro:  CNII-XII intact. , normal motor and sensation, reflexes intact   Lungs:   Clear to auscultation BL, Respirations unlabored, no wheezes / crackles  Cardio:    S1/S2, RRR,  No murmure, No Rubs or Gallops   Abdomen:   Soft, non-tender, bowel sounds active all four quadrants,  no guarding or peritoneal signs.  Muscular skeletal:  Limited exam - in bed, able to move all 4 extremities, Normal strength,  2+ pulses,  symmetric, No pitting edema  Skin:  Dry, warm to touch, negative for any Rashes,  Wounds: Please see nursing documentation      ------------------------------------------------------------------------------------------------------------------------------------------    LABs:  CBC Latest Ref Rng & Units 07/02/2020 07/02/2020 07/01/2020  WBC 4.0 - 10.5 K/uL 11.3(H) 11.0(H) 11.1(H)  Hemoglobin 13.0 - 17.0 g/dL 13.6 14.7 12.9(L)  Hematocrit 39.0 - 52.0 % 43.2  46.9 40.9  Platelets 150 - 400 K/uL 274 301 333   CMP Latest Ref Rng & Units 07/05/2020 07/04/2020 07/02/2020  Glucose 70 - 99 mg/dL 209(H) 241(H) 313(H)  BUN 8 - 23 mg/dL 9 13 14   Creatinine 0.61 - 1.24 mg/dL 0.95 0.97 0.86  Sodium 135 - 145 mmol/L 135 137 131(L)  Potassium 3.5 - 5.1 mmol/L 3.7 3.5 3.7  Chloride 98 - 111 mmol/L 101 104 100  CO2 22 - 32 mmol/L 28 27 23   Calcium 8.9 - 10.3 mg/dL 8.3(L) 8.5(L) 8.4(L)  Total Protein 6.5 - 8.1 g/dL - - 7.0  Total Bilirubin 0.3 - 1.2 mg/dL - - 0.7  Alkaline Phos 38 - 126 U/L - - 74  AST 15 - 41 U/L - - 18  ALT 0 - 44 U/L - - 18       Micro Results Recent Results (from the past 240 hour(s))  Resp Panel by RT-PCR (Flu A&B, Covid) Nasopharyngeal Swab     Status: None   Collection Time: 07/01/20 12:28 PM   Specimen: Nasopharyngeal Swab; Nasopharyngeal(NP) swabs in vial transport medium  Result Value Ref Range Status   SARS Coronavirus 2 by RT PCR NEGATIVE NEGATIVE Final    Comment: (NOTE) SARS-CoV-2 target nucleic acids are NOT DETECTED.  The SARS-CoV-2 RNA is generally detectable in upper respiratory specimens during the acute phase of infection. The lowest concentration of SARS-CoV-2 viral copies this assay can detect is 138 copies/mL. A  negative result does not preclude SARS-Cov-2 infection and should not be used as the sole basis for treatment or other patient management decisions. A negative result may occur with  improper specimen collection/handling, submission of specimen other than nasopharyngeal swab, presence of viral mutation(s) within the areas targeted by this assay, and inadequate number of viral copies(<138 copies/mL). A negative result must be combined with clinical observations, patient history, and epidemiological information. The expected result is Negative.  Fact Sheet for Patients:  EntrepreneurPulse.com.au  Fact Sheet for Healthcare Providers:  IncredibleEmployment.be  This test is no t yet approved or cleared by the [place] FDA and  has been authorized for detection and/or diagnosis of SARS-CoV-2 by FDA under an Emergency Use Authorization (EUA). This EUA will remain  in effect (meaning this test can be used) for the duration of the COVID-19 declaration under Section 564(b)(1) of the Act, 21 U.S.C.section 360bbb-3(b)(1), unless the authorization is terminated  or revoked sooner.       Influenza A by PCR NEGATIVE NEGATIVE Final   Influenza B by PCR NEGATIVE NEGATIVE Final    Comment: (NOTE) The Xpert Xpress SARS-CoV-2/FLU/RSV plus assay is intended as an aid in the diagnosis of influenza from Nasopharyngeal swab specimens and should not be used as a sole basis for treatment. Nasal washings and aspirates are unacceptable for Xpert Xpress SARS-CoV-2/FLU/RSV testing.  Fact Sheet for Patients: EntrepreneurPulse.com.au  Fact Sheet for Healthcare Providers: IncredibleEmployment.be  This test is not yet approved or cleared by the [place] FDA and has been authorized for detection and/or diagnosis of SARS-CoV-2 by FDA under an Emergency Use Authorization (EUA). This EUA will remain in effect (meaning this test can  be used) for the duration of the COVID-19 declaration under Section 564(b)(1) of the Act, 21 U.S.C. section 360bbb-3(b)(1), unless the authorization is terminated or revoked.  Performed at [facility], [address], [place],  [address]   Culture, blood (routine x 2)     Status: None (Preliminary result)   Collection Time: 07/01/20  12:42 PM   Specimen: BLOOD RIGHT HAND  Result Value Ref Range Status   Specimen Description   Final    BLOOD RIGHT HAND BOTTLES DRAWN AEROBIC AND ANAEROBIC   Special Requests Blood Culture adequate volume  Final   Culture   Final    NO GROWTH 4 DAYS Performed at [facility], [address], [place], [place] [address]    Report Status PENDING  Incomplete  Culture, blood (routine x 2)     Status: None (Preliminary result)   Collection Time: 07/01/20  1:00 PM   Specimen: BLOOD RIGHT WRIST  Result Value Ref Range Status   Specimen Description   Final    BLOOD RIGHT WRIST BOTTLES DRAWN AEROBIC AND ANAEROBIC   Special Requests Blood Culture adequate volume  Final   Culture   Final    NO GROWTH 4 DAYS Performed at [facility], [address] [place], [place] [address]    Report Status PENDING  Incomplete  Urine culture     Status: Abnormal   Collection Time: 07/01/20  2:17 PM   Specimen: Urine, Clean Catch  Result Value Ref Range Status   Specimen Description   Final    URINE, CLEAN CATCH Performed at [facility], [address] [place], [place] [address]    Special Requests   Final    NONE Performed at [facility], [address] [place], [place] [address]    Culture >=100,000 COLONIES/mL ESCHERICHIA COLI (A)  Final   Report Status 07/04/2020 FINAL  Final   Organism ID, Bacteria ESCHERICHIA COLI (A)  Final      Susceptibility   Escherichia coli - MIC*    AMPICILLIN >=32 RESISTANT Resistant     CEFAZOLIN 8 SENSITIVE Sensitive     CEFEPIME <=0.12 SENSITIVE Sensitive     CEFTRIAXONE 0.5 SENSITIVE Sensitive     CIPROFLOXACIN <=0.25  SENSITIVE Sensitive     GENTAMICIN <=1 SENSITIVE Sensitive     IMIPENEM <=0.25 SENSITIVE Sensitive     NITROFURANTOIN <=16 SENSITIVE Sensitive     TRIMETH/SULFA <=20 SENSITIVE Sensitive     AMPICILLIN/SULBACTAM >=32 RESISTANT Resistant     PIP/TAZO 8 SENSITIVE Sensitive     * >=100,000 COLONIES/mL ESCHERICHIA COLI  Culture, blood (routine x 2)     Status: None (Preliminary result)   Collection Time: 07/02/20 12:07 PM   Specimen: BLOOD  Result Value Ref Range Status   Specimen Description   Final    BLOOD BLOOD LEFT HAND Performed at [facility], [address], [place], [place] [address]    Special Requests   Final    BOTTLES DRAWN AEROBIC AND ANAEROBIC Blood Culture adequate volume Performed at [facility], [place] [address] [place], [place] [address]    Culture   Final    NO GROWTH 3 DAYS Performed at [facility], [address], [place], [place] [address]    Report Status PENDING  Incomplete  Culture, blood (routine x 2)     Status: None (Preliminary result)   Collection Time: 07/02/20 12:07 PM   Specimen: BLOOD  Result Value Ref Range Status   Specimen Description   Final    BLOOD BLOOD LEFT WRIST Performed at [facility], [place] [address], [place], [place] [address]    Special Requests   Final    BOTTLES DRAWN AEROBIC AND ANAEROBIC Blood Culture results may not be optimal due to an inadequate volume of blood received in culture  bottles Performed at [facility], [place] [address] [place], [place] [address]    Culture   Final    NO GROWTH 3 DAYS Performed at [facility], [address], [place], [place] [address]    Report Status PENDING  Incomplete  CSF culture w Gram Stain     Status: None (Preliminary result)   Collection Time: 07/03/20 10:50 AM   Specimen: CSF; Cerebrospinal Fluid  Result Value Ref Range Status   Specimen Description   Final    CSF Performed at  [place] [address] [place], [place] [address]    Special Requests   Final    NONE Performed at [facility], [address] [place], [place] [address]    Gram Stain   Final    WBC PRESENT, PREDOMINANTLY MONONUCLEAR CYTOSPIN SMEAR Performed at [facility], [address] [place], [place] [address]    Culture   Final    NO GROWTH 2 DAYS Performed at [facility], [place] [address] [place], [place] [address]    Report Status PENDING  Incomplete  Culture, fungus without smear     Status: None (Preliminary result)   Collection Time: 07/03/20 10:50 AM   Specimen: Lumbar Puncture; Cerebrospinal Fluid  Result Value Ref Range Status   Specimen Description   Final    LUMBAR Performed at [facility], [address] [place], [place] [address]    Special Requests   Final    NONE Performed at [facility], [address], [place], [place] [address]    Culture   Final    NO FUNGUS ISOLATED AFTER 2 DAYS Performed at [facility], [place] [address] [place],  [address]    Report Status PENDING  Incomplete    Radiology Reports CT CHEST ABDOMEN PELVIS W CONTRAST  Result Date: 07/01/2020 CLINICAL DATA:  Abdominal pain and fever, history coronary artery disease post MI at type II diabetes mellitus, hypertension, GERD, former smoker EXAM: CT CHEST, ABDOMEN, AND PELVIS WITH CONTRAST TECHNIQUE: Multidetector CT imaging of the chest, abdomen and pelvis was performed following the standard protocol during bolus administration of intravenous contrast. Sagittal and coronal MPR images reconstructed from axial data set. CONTRAST:  181mL OMNIPAQUE IOHEXOL 300 MG/ML SOLN IV. No oral contrast administered. COMPARISON:  11/15/2019 FINDINGS: CT CHEST FINDINGS Cardiovascular: Atherosclerotic calcifications aorta, proximal great vessels and coronary arteries. Ascending thoracic aorta dilated 4.3 cm diameter unchanged. Prior TAVR. Heart otherwise unremarkable. No pericardial effusion.  Pulmonary arteries grossly patent on non targeted exam. Mediastinum/Nodes: Base of cervical region normal appearance. Esophagus unremarkable. No thoracic adenopathy. Lungs/Pleura: Dependent atelectasis in the posterior lungs. Remaining lungs clear. No infiltrate, pleural effusion, or pneumothorax. Musculoskeletal: Mild degenerative disc disease changes thoracic spine. No acute osseous findings. CT ABDOMEN PELVIS FINDINGS Hepatobiliary: Gallbladder surgically absent. Liver normal appearance. Pancreas: Normal appearance Spleen: Normal appearance Adrenals/Urinary Tract: Adrenal glands normal appearance. BILATERAL renal cysts. Tiny nonobstructing LEFT renal calculus. Kidneys, ureters, and bladder otherwise normal appearance Stomach/Bowel: Normal appendix. Stomach and bowel loops normal appearance. Vascular/Lymphatic: Atherosclerotic calcifications aorta and iliac arteries without aneurysm. No adenopathy. Reproductive: Prostate gland and seminal vesicles obscured by beam hardening artifacts in pelvis Other: No free air or free fluid. No hernia or inflammatory process. Musculoskeletal: BILATERAL hip prostheses. No acute osseous findings. IMPRESSION: Stable ascending thoracic aortic aneurysm 4.3 cm diameter; Recommend annual imaging followup by CTA or MRA. This recommendation follows 2010 ACCF/AHA/AATS/ACR/ASA/SCA/SCAI/SIR/STS/SVM Guidelines for the Diagnosis and Management of Patients  with Thoracic Aortic Disease. Circulation. 2010; 121: M080-E233. Aortic aneurysm NOS (ICD10-I71.9) . Prior TAVR. BILATERAL renal cysts with tiny nonobstructing LEFT renal calculus. No acute abnormalities. Aortic Atherosclerosis (ICD10-I70.0). Electronically Signed   By: Lavonia Dana M.D.   On: 07/01/2020 15:29   DG Chest Port 1 View  Result Date: 07/01/2020 CLINICAL DATA:  Shortness of breath. EXAM: PORTABLE CHEST 1 VIEW COMPARISON:  December 04, 2019. FINDINGS: Low lung volumes. Enlarged cardiac silhouette, accentuated by AP technique  and low lung volumes. No visible pleural effusions or pneumothorax. Hazy bibasilar opacities likely represents accenuated diaphragmatic/mediastinal fat given lordotic positioning. IMPRESSION: 1. Limited evaluation due to low lung volumes and lordotic positioning without evidence of acute cardiopulmonary disease. Dedicated PA and lateral radiographs could better evaluate the lung bases if clinically indicated. 2. Cardiomegaly. Electronically Signed   By: Margaretha Sheffield MD   On: 07/01/2020 13:27   DG FLUORO GUIDE LUMBAR PUNCTURE  Result Date: 07/03/2020 CLINICAL DATA:  Altered mental status, fever, history of meningitis EXAM: DIAGNOSTIC LUMBAR PUNCTURE UNDER FLUOROSCOPIC GUIDANCE COMPARISON:  None FLUOROSCOPY TIME:  Fluoroscopy Time:  0 minutes 18 seconds Radiation Exposure Index (if provided by the fluoroscopic device): 6.3 mGy Number of Acquired Spot Images: 1 PROCEDURE: Procedure, benefits, and risks were discussed with the patient, including alternatives. Patient's questions were answered. Written informed consent was obtained. Timeout protocol followed. Patient placed prone. Patient post L5 laminectomy. L4-L5 disc space was localized under fluoroscopy. Skin prepped and draped in usual sterile fashion. Skin and soft tissues anesthetized with 4 mL of 1% lidocaine. 22 gauge needle was advanced into the spinal canal where clear colorless CSF was encountered with an opening pressure of 12 cm H2O (measured prone not LLD). 9 mL of CSF was obtained in 4 tubes for requested analysis. Procedure tolerated very well by patient without immediate complication. IMPRESSION: Fluoroscopic guided lumbar puncture as above. Electronically Signed   By: Lavonia Dana M.D.   On: 07/03/2020 11:48    SIGNED: Deatra James, MD, FHM. [facility],  Pager (please use [email or web address] to page/text) Please use Epic Secure Chat for non-urgent communication (7AM-7PM)  If 7PM-7AM, please contact  night-coverage [email or web address], 07/05/2020, 4:47 PM

## 2020-07-05 NOTE — Progress Notes (Signed)
Patient did not wish to use his CPAP

## 2020-07-06 DIAGNOSIS — A879 Viral meningitis, unspecified: Principal | ICD-10-CM

## 2020-07-06 LAB — CULTURE, BLOOD (ROUTINE X 2)
Culture: NO GROWTH
Culture: NO GROWTH
Special Requests: ADEQUATE
Special Requests: ADEQUATE

## 2020-07-06 LAB — GLUCOSE, CAPILLARY
Glucose-Capillary: 155 mg/dL — ABNORMAL HIGH (ref 70–99)
Glucose-Capillary: 257 mg/dL — ABNORMAL HIGH (ref 70–99)

## 2020-07-06 LAB — BASIC METABOLIC PANEL
Anion gap: 6 (ref 5–15)
BUN: 7 mg/dL — ABNORMAL LOW (ref 8–23)
CO2: 28 mmol/L (ref 22–32)
Calcium: 8.2 mg/dL — ABNORMAL LOW (ref 8.9–10.3)
Chloride: 102 mmol/L (ref 98–111)
Creatinine, Ser: 0.87 mg/dL (ref 0.61–1.24)
GFR, Estimated: >60 mL/min (ref >60)
Glucose, Bld: 166 mg/dL — ABNORMAL HIGH (ref 70–99)
Potassium: 3.1 mmol/L — ABNORMAL LOW (ref 3.5–5.1)
Sodium: 136 mmol/L (ref 135–145)

## 2020-07-06 LAB — CSF CULTURE W GRAM STAIN: Culture: NO GROWTH

## 2020-07-06 LAB — HSV 1/2 PCR, CSF
HSV-1 DNA: NEGATIVE
HSV-2 DNA: NEGATIVE

## 2020-07-06 MED ORDER — POTASSIUM CHLORIDE CRYS ER 20 MEQ PO TBCR
40.0000 meq | EXTENDED_RELEASE_TABLET | Freq: Once | ORAL | Status: AC
  Administered 2020-07-06: 40 meq via ORAL
  Filled 2020-07-06: qty 2

## 2020-07-06 MED ORDER — ACYCLOVIR SODIUM 50 MG/ML IV SOLN
INTRAVENOUS | Status: AC
  Filled 2020-07-06: qty 10

## 2020-07-06 NOTE — Progress Notes (Signed)
PROGRESS NOTE    Patient: [name]                            PCP: [name]                    DOB: 01/27/1940            DOA: 07/01/2020 EUM:[id number]             DOS: 07/06/2020, 10:43 AM   LOS: 4 days   Date of Service: The patient was seen and examined on 07/06/2020  Subjective:   The patient was seen and examined, remained stable, still complaining of bilateral eye itchiness, difficulty sleeping overnight.   Status post LP, still on IV acyclovir per ID recommendation, pending RSV results Discussed with patient and his wife that he has viral meningitis pending complete work-up, cultures  Brief Narrative:   [name] is a 80 y.o. male with medical history significant for CAD status post and placement, T2DM, GERD, hypertension, hyperlipidemia, BPH, dementia and OSA on CPAP who presents to the emergency department due to 1 day onset of fatigue, fever, body aches, headache, vomiting and generalized weakness.  He was recently exposed to people with COVID while bear hunting in [place], he denies any tick bite, but was complaining of generalized weakness and nausea limiting his ambulation.  Patient had a virtual visit with his PCP who asked him to call EMS if symptoms worsen, so this resulted in patient calling EMS.  He was already vaccinated against COVID and also already had a booster.   ED Course:  In the emergency department, he was reported to be febrile, BP was elevated at 164/83.  Work-up in the ED showed mild leukocytosis and normocytic anemia, BMP showed hyponatremia and hyperglycemia, troponin x2 was negative, lactic acid x2 was negative.  BNP was 51.  Influenza A, B, SARS coronavirus 2 was negative. CT of chest, abdomen and pelvis with contrast showed stable ascending thoracic aortic aneurysm 4.3 cm diameter, but showed no acute abnormalities. Chest x-ray showed limited evaluation due to low lung volumes and lordotic positioning without evidence of  acute cardiopulmonary disease. IV hydration was provided, Tylenol and ibuprofen were given.    Assessment & Plan:   Principal Problem:   Viral meningitis Active Problems:   Hyperlipidemia associated with type 2 diabetes mellitus ([place])   Hypertension associated with diabetes ([place])   CORONARY ATHEROSCLEROSIS NATIVE CORONARY ARTERY   GERD (gastroesophageal reflux disease)   BPH (benign prostatic hyperplasia)   Acute febrile illness   Hyperglycemia due to diabetes mellitus (H[facility])   Generalized weakness   Thoracic ascending aortic aneurysm (HCC)   Hyponatremia   Obstructive sleep apnea   Dementia (HCC)   Dehydration   Leukocytosis   Encephalopathy   Viral meningitis Encephalopathy likely metabolic versus toxic metabolic  CSF consistent with a viral meningitis -Mentation at baseline, afebrile normotensive denies any headaches or visual changes  -Status post lumbar puncture on 07/03/2020-pending cultures fluid analysis, elevated WBC -Infectious disease Dr. Gale [name] was consulted recommended IV acyclovir until HSV results Otherwise preliminary CSF report is consistent with viral meningitis  -We will continue with neurochecks--- mentation is much improved  -IV fluids... As patient will remain on IV acyclovir -Due to history of previous viral meningitis, will pursue with obtaining a lumbar puncture -Lovenox has been on hold for LP, will be resumed today  CSF labs and cytology  cultures has been ordered -Anticipating HSV results in a.m. 07/07/2020  Urine tract infection  -Urine culture revealing E. coli, pending sensitivity -Patient has been on antibiotics (tied Augmentin to Rocephin to Keflex)  -Patient reported itchiness with Keflex, switched to ciprofloxacin based on sensitivity Remained stable,  poor p.o. intake -Mentation improving, encouraging p.o. intake -IV fluid continue   Generalized weakness in the setting of acute febrile illness Proved, This is possibly secondary to a  viral illness Patient has a history of viral meningitis in the past -Titer for Lyme disease and [place] spotted disease was sent -Empiric antibiotics Augmentin initiated Continue Tylenol as needed Continue Zofran as needed Continue supportive measures Continue fall precaution and neurochecks Continue PT/OT consulted recommending home health    Accelerated hypertension Continue home medication of amlodipine, Imdur Initiating labetalol 200 mg p.o. daily 07/02/2020 -As needed hydralazine -Monitoring BP closely, improving  Hyponatremia/dehydration Na 132, 131 >> 137,   continue IV hydration -Continue monitoring, especially encouraging p.o. intake  Thoracic aortic aneurysm /history of aortic valve replacement -Per patient also reporting h/o aortic valve replacement CT chest, abdomen and pelvis with contrast showed stable ascending thoracic aortic aneurysm 4.3 cm diameter Annual imaging follow-up by CTA or MRA recommended Stable   CAD s/p post stent placement Continue aspirin, Lipitor, nitroglycerin, Imdur Stable  GERD Continue famotidine Stable  Hyperlipidemia Continue Lipitor -stable  Hyperglycemia secondary to T2DM Continue insulin sliding scale and hypoglycemia protocol Anticipating increasing Lantus to 60 units nightly Holding metformin, stable blood sugars  Obstructive sleep apnea on CPAP Continue CPAP Refused CPAP overnight, on O2 by nasal cannula, stable  Dementia Continue Aricept     --------------------------------------------------------------------------------------------------------------------------------------------------- Cultures; Lyme titer, [place] spotted disease antibody LP: CSF cytology gradient 200 WBC, lymphocytes,... HSV, cultures pending Urine culture positive for E. coli, resistant to ampicillin,  Antimicrobials: IV Rocephin switched to p.o. ciprofloxacin   Consultants:  None   ------------------------------------------------------------------------------------------------------------------------------------------------  DVT prophylaxis:  SCD/Compression stockings and Lovenox SQ Code Status:   Code Status: Full Code  Family Communication: Discussed with his wife at bedside in detail The above findings and plan of care has been discussed with patient (and family)  in detail,  they expressed understanding and agreement of above. -Advance care planning has been discussed.   Admission status:   Status is: Inpatient  Patient meets the end patient criteria anticipated greater than 2 midnight stay for continued valuation of encephalopathy, possible meningitis, febrile illness, poor p.o. intake, dehydration, accelerated hypertension    Dispo: The patient is from: Home              Anticipated d/c is to: Home in 1-2 days with home health              Patient currently is not medically stable to d/c.  Persistent headache, poor p.o. intake, etc. Hypertension, Needing IV fluid resuscitation, IV acyclovir   Difficult to place patient No  Level of care: Telemetry   Procedures:   No admission procedures for hospital encounter.    Antimicrobials:  Anti-infectives (From admission, onward)    Start     Dose/Rate Route Frequency Ordered Stop   07/05/20 1500  ciprofloxacin (CIPRO) tablet 250 mg        250 mg Oral 2 times daily 07/05/20 1021     07/04/20 1400  cephALEXin (KEFLEX) capsule 500 mg  Status:  Discontinued        500 mg Oral Every 8 hours 07/04/20 1100 07/05/20 1016   07/03/20 2000  cefTRIAXone (ROCEPHIN) 2 g in sodium chloride 0.9 % 100 mL IVPB  Status:  Discontinued        2 g 200 mL/hr over 30 Minutes Intravenous Every 12 hours 07/03/20 1437 07/04/20 0956   07/03/20 1545  acyclovir (ZOVIRAX) 790 mg in dextrose 5 % 150 mL IVPB        10 mg/kg  79 kg (Adjusted) 165.8 mL/hr over 60 Minutes Intravenous Every 8 hours 07/03/20 1451     07/02/20  1400  amoxicillin-clavulanate (AUGMENTIN) 500-125 MG per tablet 500 mg  Status:  Discontinued        1 tablet Oral Every 8 hours 07/02/20 1026 07/03/20 1437        Medication:   acetaminophen  650 mg Oral BID   amLODipine  10 mg Oral Daily   aspirin EC  81 mg Oral QHS   atorvastatin  40 mg Oral QPM   ciprofloxacin  250 mg Oral BID   donepezil  5 mg Oral QHS   enoxaparin (LOVENOX) injection  50 mg Subcutaneous Q24H   famotidine  20 mg Oral Daily   insulin aspart  0-9 Units Subcutaneous TID WC   insulin glargine  60 Units Subcutaneous QHS   isosorbide mononitrate  30 mg Oral QPM   labetalol  200 mg Oral BID   loratadine  10 mg Oral Daily   naphazoline-glycerin  1-2 drop Both Eyes QID   neomycin-polymyxin b-dexamethasone   Both Eyes TID   PARoxetine  20 mg Oral Daily   tamsulosin  0.4 mg Oral Daily   vitamin B-12  2,000 mcg Oral Daily    butalbital-acetaminophen-caffeine, diphenhydrAMINE, ibuprofen, nitroGLYCERIN, ondansetron (ZOFRAN) IV, polyvinyl alcohol   Objective:   Vitals:   07/05/20 0547 07/05/20 1414 07/05/20 1953 07/06/20 0541  BP: (!) 148/69 (!) 160/67 (!) 156/68 (!) 158/69  Pulse: (!) 59 65 65 61  Resp: 18 18 18 20   Temp: 97.9 F (36.6 C) 97.9 F (36.6 C) 98.2 F (36.8 C) 98.5 F (36.9 C)  TempSrc: Oral Oral Oral   SpO2: 95% 96% 96% 96%  Weight:      Height:        Intake/Output Summary (Last 24 hours) at 07/06/2020 1043 Last data filed at 07/06/2020 5027 Gross per 24 hour  Intake 1568 ml  Output 350 ml  Net 1218 ml   Filed Weights   07/01/20 1231 07/01/20 2200  Weight: 102.1 kg 101.7 kg     Examination:     Physical Exam:   General:  Alert, oriented, cooperative, no distress;   HEENT:  Bilateral cornea erythema, BL eyelids erythema ... normocephalic, PERRL, otherwise with in Normal limits   Neuro:  CNII-XII intact. , normal motor and sensation, reflexes intact   Lungs:   Clear to auscultation BL, Respirations unlabored, no wheezes /  crackles  Cardio:    S1/S2, RRR, No murmure, No Rubs or Gallops   Abdomen:   Soft, non-tender, bowel sounds active all four quadrants,  no guarding or peritoneal signs.  Muscular skeletal:  Limited exam - in bed, able to move all 4 extremities, Normal strength,  2+ pulses,  symmetric, No pitting edema  Skin:  Dry, warm to touch, negative for any Rashes,  Wounds: Please see nursing documentation         ------------------------------------------------------------------------------------------------------------------------------------------    LABs:  CBC Latest Ref Rng & Units 07/02/2020 07/02/2020 07/01/2020  WBC 4.0 - 10.5 K/uL 11.3(H) 11.0(H) 11.1(H)  Hemoglobin 13.0 - 17.0 g/dL 13.6 14.7  12.9(L)  Hematocrit 39.0 - 52.0 % 43.2 46.9 40.9  Platelets 150 - 400 K/uL 274 301 333   CMP Latest Ref Rng & Units 07/06/2020 07/05/2020 07/04/2020  Glucose 70 - 99 mg/dL 166(H) 209(H) 241(H)  BUN 8 - 23 mg/dL 7(L) 9 13  Creatinine 0.61 - 1.24 mg/dL 0.87 0.95 0.97  Sodium 135 - 145 mmol/L 136 135 137  Potassium 3.5 - 5.1 mmol/L 3.1(L) 3.7 3.5  Chloride 98 - 111 mmol/L 102 101 104  CO2 22 - 32 mmol/L 28 28 27   Calcium 8.9 - 10.3 mg/dL 8.2(L) 8.3(L) 8.5(L)  Total Protein 6.5 - 8.1 g/dL - - -  Total Bilirubin 0.3 - 1.2 mg/dL - - -  Alkaline Phos 38 - 126 U/L - - -  AST 15 - 41 U/L - - -  ALT 0 - 44 U/L - - -       Micro Results Recent Results (from the past 240 hour(s))  Resp Panel by RT-PCR (Flu A&B, Covid) Nasopharyngeal Swab     Status: None   Collection Time: 07/01/20 12:28 PM   Specimen: Nasopharyngeal Swab; Nasopharyngeal(NP) swabs in vial transport medium  Result Value Ref Range Status   SARS Coronavirus 2 by RT PCR NEGATIVE NEGATIVE Final    Comment: (NOTE) SARS-CoV-2 target nucleic acids are NOT DETECTED.  The SARS-CoV-2 RNA is generally detectable in upper respiratory specimens during the acute phase of infection. The lowest concentration of SARS-CoV-2 viral copies this assay  can detect is 138 copies/mL. A negative result does not preclude SARS-Cov-2 infection and should not be used as the sole basis for treatment or other patient management decisions. A negative result may occur with  improper specimen collection/handling, submission of specimen other than nasopharyngeal swab, presence of viral mutation(s) within the areas targeted by this assay, and inadequate number of viral copies(<138 copies/mL). A negative result must be combined with clinical observations, patient history, and epidemiological information. The expected result is Negative.  Fact Sheet for Patients:  EntrepreneurPulse.com.au  Fact Sheet for Healthcare Providers:  IncredibleEmployment.be  This test is no t yet approved or cleared by the [place] FDA and  has been authorized for detection and/or diagnosis of SARS-CoV-2 by FDA under an Emergency Use Authorization (EUA). This EUA will remain  in effect (meaning this test can be used) for the duration of the COVID-19 declaration under Section 564(b)(1) of the Act, 21 U.S.C.section 360bbb-3(b)(1), unless the authorization is terminated  or revoked sooner.       Influenza A by PCR NEGATIVE NEGATIVE Final   Influenza B by PCR NEGATIVE NEGATIVE Final    Comment: (NOTE) The Xpert Xpress SARS-CoV-2/FLU/RSV plus assay is intended as an aid in the diagnosis of influenza from Nasopharyngeal swab specimens and should not be used as a sole basis for treatment. Nasal washings and aspirates are unacceptable for Xpert Xpress SARS-CoV-2/FLU/RSV testing.  Fact Sheet for Patients: EntrepreneurPulse.com.au  Fact Sheet for Healthcare Providers: IncredibleEmployment.be  This test is not yet approved or cleared by the [place] FDA and has been authorized for detection and/or diagnosis of SARS-CoV-2 by FDA under an Emergency Use Authorization (EUA). This EUA will  remain in effect (meaning this test can be used) for the duration of the COVID-19 declaration under Section 564(b)(1) of the Act, 21 U.S.C. section 360bbb-3(b)(1), unless the authorization is terminated or revoked.  Performed at [name], [address] [place], [place] [address]   Culture, blood (routine x 2)     Status:  None (Preliminary result)   Collection Time: 07/01/20 12:42 PM   Specimen: BLOOD RIGHT HAND  Result Value Ref Range Status   Specimen Description   Final    BLOOD RIGHT HAND BOTTLES DRAWN AEROBIC AND ANAEROBIC   Special Requests Blood Culture adequate volume  Final   Culture   Final    NO GROWTH 4 DAYS Performed at [facility], [address], [place], [place] [address]    Report Status PENDING  Incomplete  Culture, blood (routine x 2)     Status: None (Preliminary result)   Collection Time: 07/01/20  1:00 PM   Specimen: BLOOD RIGHT WRIST  Result Value Ref Range Status   Specimen Description   Final    BLOOD RIGHT WRIST BOTTLES DRAWN AEROBIC AND ANAEROBIC   Special Requests Blood Culture adequate volume  Final   Culture   Final    NO GROWTH 4 DAYS Performed at [facility], [address] [place], [place] [address]    Report Status PENDING  Incomplete  Urine culture     Status: Abnormal   Collection Time: 07/01/20  2:17 PM   Specimen: Urine, Clean Catch  Result Value Ref Range Status   Specimen Description   Final    URINE, CLEAN CATCH Performed at [facility], [address] [place], [place] [address]    Special Requests   Final    NONE Performed at [facility], [address], [place], [place] [address]    Culture >=100,000 COLONIES/mL ESCHERICHIA COLI (A)  Final   Report Status 07/04/2020 FINAL  Final   Organism ID, Bacteria ESCHERICHIA COLI (A)  Final      Susceptibility   Escherichia coli - MIC*    AMPICILLIN >=32 RESISTANT Resistant     CEFAZOLIN 8 SENSITIVE Sensitive     CEFEPIME <=0.12 SENSITIVE Sensitive     CEFTRIAXONE 0.5 SENSITIVE  Sensitive     CIPROFLOXACIN <=0.25 SENSITIVE Sensitive     GENTAMICIN <=1 SENSITIVE Sensitive     IMIPENEM <=0.25 SENSITIVE Sensitive     NITROFURANTOIN <=16 SENSITIVE Sensitive     TRIMETH/SULFA <=20 SENSITIVE Sensitive     AMPICILLIN/SULBACTAM >=32 RESISTANT Resistant     PIP/TAZO 8 SENSITIVE Sensitive     * >=100,000 COLONIES/mL ESCHERICHIA COLI  Culture, blood (routine x 2)     Status: None (Preliminary result)   Collection Time: 07/02/20 12:07 PM   Specimen: BLOOD  Result Value Ref Range Status   Specimen Description   Final    BLOOD BLOOD LEFT HAND Performed at [facility], [address], [place], [place] [address]    Special Requests   Final    BOTTLES DRAWN AEROBIC AND ANAEROBIC Blood Culture adequate volume Performed at [facility], [place] [address], [place], [place] [address]    Culture   Final    NO GROWTH 3 DAYS Performed at [facility], [address], [place], [place] [address]    Report Status PENDING  Incomplete  Culture, blood (routine x 2)     Status: None (Preliminary result)   Collection Time: 07/02/20 12:07 PM   Specimen: BLOOD  Result Value Ref Range Status   Specimen Description   Final    BLOOD BLOOD LEFT WRIST Performed at [facility], [place] [address] [place],  [address]    Special Requests   Final    BOTTLES DRAWN AEROBIC AND ANAEROBIC Blood Culture results may not be optimal due to  an inadequate volume of blood received in culture bottles Performed at [facility], [address] [place], [place] [address]    Culture   Final    NO GROWTH 3 DAYS Performed at [facility], [address], [place], [place] [address]    Report Status PENDING  Incomplete  CSF culture w Gram Stain     Status: None (Preliminary result)   Collection Time: 07/03/20 10:50 AM   Specimen: CSF; Cerebrospinal Fluid  Result Value Ref Range Status   Specimen  Description   Final    CSF Performed at [place] [address] [place], [place] [address]    Special Requests   Final    NONE Performed at [facility], [address] [place], [place] [address]    Gram Stain   Final    WBC PRESENT, PREDOMINANTLY MONONUCLEAR NO ORGANISMS SEEN CYTOSPIN SMEAR    Culture   Final    NO GROWTH 2 DAYS Performed at [facility], [place] [address], [place], [place] [address]    Report Status PENDING  Incomplete  Culture, fungus without smear     Status: None (Preliminary result)   Collection Time: 07/03/20 10:50 AM   Specimen: Lumbar Puncture; Cerebrospinal Fluid  Result Value Ref Range Status   Specimen Description   Final    LUMBAR Performed at [facility], [address] [place], [place] [address]    Special Requests   Final    NONE Performed at [facility], [address], [place], [place] [address]    Culture   Final    NO FUNGUS ISOLATED AFTER 2 DAYS Performed at [facility], [place] [address] [place], [place] [address]    Report Status PENDING  Incomplete  Anaerobic culture w Gram Stain     Status: None (Preliminary result)   Collection Time: 07/03/20 10:50 AM   Specimen: Lumbar Puncture; Cerebrospinal Fluid  Result Value Ref Range Status   Specimen Description   Final    LUMBAR Performed at [facility], [address], [place], [place] [address]    Special Requests   Final    NONE Performed at [facility], [address] [place], [place] [address]    Gram Stain   Final    WBC PRESENT, PREDOMINANTLY MONONUCLEAR NO ORGANISMS SEEN CYTOSPIN SMEAR Performed at [facility], [place] [address] [place], [place] [address]    Culture   Final    NO ANAEROBES ISOLATED; CULTURE IN PROGRESS FOR 5 DAYS   Report Status PENDING  Incomplete    Radiology Reports CT CHEST ABDOMEN PELVIS W CONTRAST  Result Date: 07/01/2020 CLINICAL DATA:  Abdominal pain and fever, history coronary artery disease post MI at type II  diabetes mellitus, hypertension, GERD, former smoker EXAM: CT CHEST, ABDOMEN, AND PELVIS WITH CONTRAST TECHNIQUE: Multidetector CT imaging of the chest, abdomen and pelvis was performed following the standard protocol during bolus administration of intravenous contrast. Sagittal and coronal MPR images reconstructed from axial data set. CONTRAST:  165mL OMNIPAQUE IOHEXOL 300 MG/ML SOLN IV. No oral contrast administered. COMPARISON:  11/15/2019 FINDINGS: CT CHEST FINDINGS Cardiovascular: Atherosclerotic calcifications aorta, proximal great vessels and coronary arteries. Ascending thoracic aorta dilated 4.3 cm diameter unchanged. Prior TAVR. Heart otherwise unremarkable. No pericardial effusion. Pulmonary arteries grossly patent on non targeted exam. Mediastinum/Nodes: Base of cervical region normal appearance. Esophagus unremarkable. No thoracic adenopathy. Lungs/Pleura: Dependent atelectasis in the posterior lungs. Remaining lungs clear. No  infiltrate, pleural effusion, or pneumothorax. Musculoskeletal: Mild degenerative disc disease changes thoracic spine. No acute osseous findings. CT ABDOMEN PELVIS FINDINGS Hepatobiliary: Gallbladder surgically absent. Liver normal appearance. Pancreas: Normal appearance Spleen: Normal appearance Adrenals/Urinary Tract: Adrenal glands normal appearance. BILATERAL renal cysts. Tiny nonobstructing LEFT renal calculus. Kidneys, ureters, and bladder otherwise normal appearance Stomach/Bowel: Normal appendix. Stomach and bowel loops normal appearance. Vascular/Lymphatic: Atherosclerotic calcifications aorta and iliac arteries without aneurysm. No adenopathy. Reproductive: Prostate gland and seminal vesicles obscured by beam hardening artifacts in pelvis Other: No free air or free fluid. No hernia or inflammatory process. Musculoskeletal: BILATERAL hip prostheses. No acute osseous findings. IMPRESSION: Stable ascending thoracic aortic aneurysm 4.3 cm diameter; Recommend annual imaging  followup by CTA or MRA. This recommendation follows 2010 ACCF/AHA/AATS/ACR/ASA/SCA/SCAI/SIR/STS/SVM Guidelines for the Diagnosis and Management of Patients with Thoracic Aortic Disease. Circulation. 2010; 121: T016-W109. Aortic aneurysm NOS (ICD10-I71.9) . Prior TAVR. BILATERAL renal cysts with tiny nonobstructing LEFT renal calculus. No acute abnormalities. Aortic Atherosclerosis (ICD10-I70.0). Electronically Signed   By: Lavonia Dana M.D.   On: 07/01/2020 15:29   DG Chest Port 1 View  Result Date: 07/01/2020 CLINICAL DATA:  Shortness of breath. EXAM: PORTABLE CHEST 1 VIEW COMPARISON:  December 04, 2019. FINDINGS: Low lung volumes. Enlarged cardiac silhouette, accentuated by AP technique and low lung volumes. No visible pleural effusions or pneumothorax. Hazy bibasilar opacities likely represents accenuated diaphragmatic/mediastinal fat given lordotic positioning. IMPRESSION: 1. Limited evaluation due to low lung volumes and lordotic positioning without evidence of acute cardiopulmonary disease. Dedicated PA and lateral radiographs could better evaluate the lung bases if clinically indicated. 2. Cardiomegaly. Electronically Signed   By: Margaretha Sheffield [name]   On: 07/01/2020 13:27   DG FLUORO GUIDE LUMBAR PUNCTURE  Result Date: 07/03/2020 CLINICAL DATA:  Altered mental status, fever, history of meningitis EXAM: DIAGNOSTIC LUMBAR PUNCTURE UNDER FLUOROSCOPIC GUIDANCE COMPARISON:  None FLUOROSCOPY TIME:  Fluoroscopy Time:  0 minutes 18 seconds Radiation Exposure Index (if provided by the fluoroscopic device): 6.3 mGy Number of Acquired Spot Images: 1 PROCEDURE: Procedure, benefits, and risks were discussed with the patient, including alternatives. Patient's questions were answered. Written informed consent was obtained. Timeout protocol followed. Patient placed prone. Patient post L5 laminectomy. L4-L5 disc space was localized under fluoroscopy. Skin prepped and draped in usual sterile fashion. Skin and soft  tissues anesthetized with 4 mL of 1% lidocaine. 22 gauge needle was advanced into the spinal canal where clear colorless CSF was encountered with an opening pressure of 12 cm H2O (measured prone not LLD). 9 mL of CSF was obtained in 4 tubes for requested analysis. Procedure tolerated very well by patient without immediate complication. IMPRESSION: Fluoroscopic guided lumbar puncture as above. Electronically Signed   By: Lavonia Dana M.D.   On: 07/03/2020 11:48    SIGNED: Deatra James, [name], FHM. [facility],  Pager (please use [email or web address] to page/text) Please use Epic Secure Chat for non-urgent communication (7AM-7PM)  If 7PM-7AM, please contact night-coverage [email or web address], 07/06/2020, 10:43 AM

## 2020-07-06 NOTE — Progress Notes (Signed)
Pharmacy Antibiotic Note  [name] is a 80 y.o. male admitted on 07/01/2020 with  possible viral meningitis .  Pharmacy has been consulted for acyclovir dosing. Awaiting HSV results. Checked with lab and should be available 6/27. Patient is back to baseline. CSF c/w viral meningitis  Plan: Continue Acyclovir 790 mg IV every 8 hours using ABW. Monitor labs, c/s, and patient improvement.  Height: 5\' 6"  (167.6 cm) Weight: 101.7 kg (224 lb 3.3 oz) IBW/kg (Calculated) : 63.8  Temp (24hrs), Avg:98.2 F (36.8 C), Min:97.9 F (36.6 C), Max:98.5 F (36.9 C)  Recent Labs  Lab 07/01/20 1227 07/01/20 1444 07/02/20 0612 07/02/20 1338 07/04/20 0620 07/05/20 0637 07/06/20 0625  WBC 11.1*  --  11.0* 11.3*  --   --   --   CREATININE 0.89  --  0.83 0.86 0.97 0.95 0.87  LATICACIDVEN 1.4 1.7  --   --   --   --   --      Estimated Creatinine Clearance: 75.7 mL/min (by C-G formula based on SCr of 0.87 mg/dL).    Allergies  Allergen Reactions   Ace Inhibitors Hives, Swelling and Rash    Rash,hives,tongue swelling   Angiotensin Receptor Blockers     Unknown reaction    Ciprofloxacin Other (See Comments)    confusion   Oxycodone-Acetaminophen Other (See Comments)    Unknown reaction   Robaxin [Methocarbamol] Other (See Comments)    Confusion    Toradol [Ketorolac Tromethamine] Other (See Comments)    confusion   Valium Other (See Comments)    Hallucinations; confusion   Doxycycline Hives and Rash    Antimicrobials this admission: Acyclovir 6/23 >>  CTX 6/23 >> 6/24 Keflex 6/24>> 6/25 Cipro 6/25>> Augmentin 6/22 >> 6/23  Microbiology results: 6/21 Bcx: ngtd 6/21 Ucx: >100k E. Coli S- cefazolin LP clear, WBC 234,  6/23 HSV pending  Thank you for allowing pharmacy to be a part of this patient's care.  Isac Sarna, BS Pharm D, [place] Clinical Pharmacist Pager [phone] 07/06/2020 10:11 AM

## 2020-07-07 ENCOUNTER — Telehealth: Payer: Self-pay

## 2020-07-07 ENCOUNTER — Ambulatory Visit: Payer: Medicare Other | Admitting: Family Medicine

## 2020-07-07 LAB — CULTURE, BLOOD (ROUTINE X 2)
Culture: NO GROWTH
Culture: NO GROWTH
Special Requests: ADEQUATE

## 2020-07-07 LAB — CBC
HCT: 38.2 % — ABNORMAL LOW (ref 39.0–52.0)
Hemoglobin: 12.2 g/dL — ABNORMAL LOW (ref 13.0–17.0)
MCH: 28 pg (ref 26.0–34.0)
MCHC: 31.9 g/dL (ref 30.0–36.0)
MCV: 87.8 fL (ref 80.0–100.0)
Platelets: 276 10*3/uL (ref 150–400)
RBC: 4.35 MIL/uL (ref 4.22–5.81)
RDW: 14.8 % (ref 11.5–15.5)
WBC: 7.7 10*3/uL (ref 4.0–10.5)
nRBC: 0 % (ref 0.0–0.2)

## 2020-07-07 LAB — BASIC METABOLIC PANEL
Anion gap: 6 (ref 5–15)
BUN: 11 mg/dL (ref 8–23)
CO2: 27 mmol/L (ref 22–32)
Calcium: 8.5 mg/dL — ABNORMAL LOW (ref 8.9–10.3)
Chloride: 104 mmol/L (ref 98–111)
Creatinine, Ser: 0.82 mg/dL (ref 0.61–1.24)
GFR, Estimated: >60 mL/min (ref >60)
Glucose, Bld: 192 mg/dL — ABNORMAL HIGH (ref 70–99)
Potassium: 3.5 mmol/L (ref 3.5–5.1)
Sodium: 137 mmol/L (ref 135–145)

## 2020-07-07 LAB — GLUCOSE, CAPILLARY
Glucose-Capillary: 271 mg/dL — ABNORMAL HIGH (ref 70–99)
Glucose-Capillary: 274 mg/dL — ABNORMAL HIGH (ref 70–99)
Glucose-Capillary: 170 mg/dL — ABNORMAL HIGH (ref 70–99)
Glucose-Capillary: 306 mg/dL — ABNORMAL HIGH (ref 70–99)

## 2020-07-07 MED ORDER — CIPROFLOXACIN HCL 250 MG PO TABS: 250.0000 mg | ORAL_TABLET | Freq: Two times a day (BID) | ORAL | 0 refills | Status: AC

## 2020-07-07 MED ORDER — POTASSIUM CHLORIDE CRYS ER 20 MEQ PO TBCR
20.0000 meq | EXTENDED_RELEASE_TABLET | Freq: Once | ORAL | Status: AC
  Administered 2020-07-07: 20 meq via ORAL
  Filled 2020-07-07: qty 1

## 2020-07-07 MED ORDER — POTASSIUM CHLORIDE CRYS ER 20 MEQ PO TBCR: 40.0000 meq | EXTENDED_RELEASE_TABLET | Freq: Once | ORAL | Status: DC

## 2020-07-07 NOTE — TOC Transition Note (Signed)
Transition of Care [facility]) - CM/SW Discharge Note   Patient Details  Name: LINTON STOLP MRN: [id number] Date of Birth: [birth year]  Transition of Care [facility]) CM/SW Contact:  Salome Arnt, LCSW Phone Number: 07/07/2020, 1:03 PM   Clinical Narrative:  Pt d/c today home with home health. Pt's wife aware and agreeable. Linda with [facility] notified of [place], [place], and SW orders.      Final next level of care: [place] Barriers to Discharge: Barriers Resolved   Patient Goals and CMS Choice Patient states their goals for this hospitalization and ongoing recovery are:: to go home. CMS Medicare.gov Compare Post Acute Care list provided to:: Patient    Discharge Placement                    Patient and family notified of of transfer: 07/07/20  Discharge Plan and Services                DME Arranged: N/A DME Agency: NA       HH Arranged: PT, RN, Social Work [employer] Agency: [place] Date [place]: 07/07/20 Time [place]: 1408 Representative spoke with at [place]: [place]) Interventions     Readmission Risk Interventions No flowsheet data found.

## 2020-07-07 NOTE — Care Management Important Message (Signed)
Important Message  Patient Details  Name: [name] MRN: [id number] Date of Birth: August 08, 1940   Medicare Important Message Given:  Yes (spoke with wife, Dione telephonically at [phone], explained letter. no copy needed.)     Tommy Medal 07/07/2020, 11:29 AM

## 2020-07-07 NOTE — Telephone Encounter (Signed)
Contact Date: 07/07/2020 Contacted By: Felicity Coyer, LPN  Transition Care Management Follow-up Telephone Call Date of discharge and from where: 07/07/2020 [facility] Discharge Diagnosis:Viral meningitis How have you been since you were released from the hospital? Just came home today, resting Any questions or concerns? No   Items Reviewed: Did the pt receive and understand the discharge instructions provided? Yes  Medications obtained and verified? Yes  Any new allergies since your discharge? No  Dietary orders reviewed? Yes Do you have support at home? Yes   Discontinued Medications None New Medications Added None  Current Medication List Allergies as of 07/07/2020       Reactions   Ace Inhibitors Hives, Swelling, Rash   Rash,hives,tongue swelling   Angiotensin Receptor Blockers    Unknown reaction    Ciprofloxacin Other (See Comments)   confusion   Oxycodone-acetaminophen Other (See Comments)   Unknown reaction   Robaxin [methocarbamol] Other (See Comments)   Confusion    Toradol [ketorolac Tromethamine] Other (See Comments)   confusion   Valium Other (See Comments)   Hallucinations; confusion   Doxycycline Hives, Rash        Medication List        Accurate as of July 07, 2020  2:45 PM. If you have any questions, ask your nurse or doctor.          acetaminophen 650 MG CR tablet Commonly known as: TYLENOL Take 1,300 mg by mouth 2 (two) times daily.   amLODipine 10 MG tablet Commonly known as: NORVASC TAKE 1 TABLET BY MOUTH  DAILY   aspirin EC 81 MG tablet Take 81 mg by mouth at bedtime.   atorvastatin 40 MG tablet Commonly known as: LIPITOR TAKE 1 TABLET BY MOUTH IN  THE EVENING What changed: when to take this   butalbital-acetaminophen-caffeine 50-325-40 MG tablet Commonly known as: FIORICET Take 1 tablet by mouth 2 (two) times daily as needed for headache.   cetirizine 10 MG tablet Commonly known as: ZYRTEC Take 10 mg by mouth daily with  supper.   ciprofloxacin 250 MG tablet Commonly known as: CIPRO Take 1 tablet (250 mg total) by mouth 2 (two) times daily for 5 days.   Dexcom G6 Receiver Devi USE TO CHECK BLOOD SUGAR UP TO 4 TIMES DAILY AS DIRECTED. 1 DEVICE PER YEAR. DX: E11.9   Dexcom G6 Sensor Misc USE TO CHECK BLOOD SUGAR UP TO 4 TIMES DAILY AS DIRECTED. CHANGE SENSOR EVERY 30 DAYS. DX: E11.9   Dexcom G6 Transmitter Misc USE TO CHECK BLOOD SUGAR UP TO 4 TIMES DAILY AS DIRECTED. CHANGE TRANSMITTER EVERY 3 MONTHS. DX: E11.9   docusate sodium 100 MG capsule Commonly known as: COLACE Take 100 mg by mouth daily as needed for mild constipation or moderate constipation.   donepezil 5 MG tablet Commonly known as: ARICEPT Take 1 tablet (5 mg total) by mouth at bedtime.   famotidine 20 MG tablet Commonly known as: PEPCID Take 1 tablet (20 mg total) by mouth 2 (two) times daily with a meal.   Fish Oil 1200 MG Caps Take 1,200-2,400 mg by mouth See admin instructions. Tale 1200 mg in the morning and 2400 mg in the evening   furosemide 20 MG tablet Commonly known as: LASIX TAKE 1 TABLET BY MOUTH  DAILY AS NEEDED   insulin lispro 100 UNIT/ML injection Commonly known as: HUMALOG Inject 15-20 Units into the skin 3 (three) times daily as needed for high blood sugar.   isosorbide mononitrate 30 MG 24  hr tablet Commonly known as: IMDUR TAKE 1 TABLET BY MOUTH IN  THE EVENING   Lantus SoloStar 100 UNIT/ML Solostar Pen Generic drug: insulin glargine INJECT SUBCUTANEOUSLY 56 UNITS AT BEDTIME   metFORMIN 500 MG 24 hr tablet Commonly known as: GLUCOPHAGE-XR Take 2 tablets (1,000 mg total) by mouth 2 (two) times daily.   nitroGLYCERIN 0.4 MG SL tablet Commonly known as: NITROSTAT DISSOLVE ONE TABLET UNDER THE TONGUE EVERY 5 MINUTES AS NEEDED FOR CHEST PAIN.  DO NOT EXCEED A TOTAL OF 3 DOSES IN 15 MINUTES   OneTouch Ultra test strip Generic drug: glucose blood Check BS TID Dx E11.59   PARoxetine 20 MG  tablet Commonly known as: PAXIL Take 1 tablet (20 mg total) by mouth daily.   tamsulosin 0.4 MG Caps capsule Commonly known as: FLOMAX Take 1 capsule (0.4 mg total) by mouth daily.   Turmeric Curcumin 500 MG Caps Take 500 mg by mouth daily.   vitamin B-12 1000 MCG tablet Commonly known as: CYANOCOBALAMIN Take 2,000 mcg by mouth daily.   Vitamin D3 50 MCG (2000 UT) capsule Take 4,000 Units by mouth daily.         Home Care and Equipment/Supplies: Were home health services ordered? yes If so, what is the name of the agency? [facility]  Has the agency set up a time to come to the patient's home? no Were any new equipment or medical supplies ordered?  No What is the name of the medical supply agency? Not applicable Were you able to get the supplies/equipment? not applicable Do you have any questions related to the use of the equipment or supplies? No  Functional Questionnaire: (I = Independent and D = Dependent) ADLs: Dependent  Bathing/Dressing- Independent  Meal Prep- Dependent  Eating- Independent  Maintaining continence- Independent  Transferring/Ambulation- Independent  Managing Meds- Independent  Follow up appointments reviewed:  PCP Hospital f/u appt confirmed? Yes  Scheduled to see Dr. Warrick Parisian on 07/18/20 @ 9:10 am. [place] [facility] f/u appt confirmed? No   Are transportation arrangements needed? No  If their condition worsens, is the pt aware to call PCP or go to the Emergency Dept.? Yes Was the patient provided with contact information for the PCP's office or ED? Yes Was to pt encouraged to call back with questions or concerns? Yes

## 2020-07-07 NOTE — Progress Notes (Signed)
Nsg Discharge Note  Admit Date:  07/01/2020 Discharge date: 07/07/2020   [name] to be D/C'd Home per MD order.  AVS completed.  Copy for chart, and copy for patient signed, and dated. Patient/caregiver able to verbalize understanding.  Discharge Medication: Allergies as of 07/07/2020       Reactions   Ace Inhibitors Hives, Swelling, Rash   Rash,hives,tongue swelling   Angiotensin Receptor Blockers    Unknown reaction    Ciprofloxacin Other (See Comments)   confusion   Oxycodone-acetaminophen Other (See Comments)   Unknown reaction   Robaxin [methocarbamol] Other (See Comments)   Confusion    Toradol [ketorolac Tromethamine] Other (See Comments)   confusion   Valium Other (See Comments)   Hallucinations; confusion   Doxycycline Hives, Rash        Medication List     STOP taking these medications    amoxicillin-clavulanate 875-125 MG tablet Commonly known as: Augmentin       TAKE these medications    acetaminophen 650 MG CR tablet Commonly known as: TYLENOL Take 1,300 mg by mouth 2 (two) times daily.   amLODipine 10 MG tablet Commonly known as: NORVASC TAKE 1 TABLET BY MOUTH  DAILY   aspirin EC 81 MG tablet Take 81 mg by mouth at bedtime.   butalbital-acetaminophen-caffeine 50-325-40 MG tablet Commonly known as: FIORICET Take 1 tablet by mouth 2 (two) times daily as needed for headache.   cetirizine 10 MG tablet Commonly known as: ZYRTEC Take 10 mg by mouth daily with supper.   ciprofloxacin 250 MG tablet Commonly known as: CIPRO Take 1 tablet (250 mg total) by mouth 2 (two) times daily for 5 days.   Dexcom G6 Receiver Devi USE TO CHECK BLOOD SUGAR UP TO 4 TIMES DAILY AS DIRECTED. 1 DEVICE PER YEAR. DX: E11.9   Dexcom G6 Sensor Misc USE TO CHECK BLOOD SUGAR UP TO 4 TIMES DAILY AS DIRECTED. CHANGE SENSOR EVERY 30 DAYS. DX: E11.9   Dexcom G6 Transmitter Misc USE TO CHECK BLOOD SUGAR UP TO 4 TIMES DAILY AS DIRECTED. CHANGE TRANSMITTER  EVERY 3 MONTHS. DX: E11.9   docusate sodium 100 MG capsule Commonly known as: COLACE Take 100 mg by mouth daily as needed for mild constipation or moderate constipation.   donepezil 5 MG tablet Commonly known as: ARICEPT Take 1 tablet (5 mg total) by mouth at bedtime.   famotidine 20 MG tablet Commonly known as: PEPCID Take 1 tablet (20 mg total) by mouth 2 (two) times daily with a meal.   Fish Oil 1200 MG Caps Take 1,200-2,400 mg by mouth See admin instructions. Tale 1200 mg in the morning and 2400 mg in the evening   furosemide 20 MG tablet Commonly known as: LASIX TAKE 1 TABLET BY MOUTH  DAILY AS NEEDED What changed: reasons to take this   insulin lispro 100 UNIT/ML injection Commonly known as: HUMALOG Inject 15-20 Units into the skin 3 (three) times daily as needed for high blood sugar.   isosorbide mononitrate 30 MG 24 hr tablet Commonly known as: IMDUR TAKE 1 TABLET BY MOUTH IN  THE EVENING What changed: when to take this   Lantus SoloStar 100 UNIT/ML Solostar Pen Generic drug: insulin glargine INJECT SUBCUTANEOUSLY 56 UNITS AT BEDTIME What changed:  how much to take how to take this when to take this additional instructions   metFORMIN 500 MG 24 hr tablet Commonly known as: GLUCOPHAGE-XR Take 2 tablets (1,000 mg total) by mouth 2 (two) times daily.  nitroGLYCERIN 0.4 MG SL tablet Commonly known as: NITROSTAT DISSOLVE ONE TABLET UNDER THE TONGUE EVERY 5 MINUTES AS NEEDED FOR CHEST PAIN.  DO NOT EXCEED A TOTAL OF 3 DOSES IN 15 MINUTES What changed:  how much to take how to take this when to take this reasons to take this additional instructions   OneTouch Ultra test strip Generic drug: glucose blood Check BS TID Dx E11.59   PARoxetine 20 MG tablet Commonly known as: PAXIL Take 1 tablet (20 mg total) by mouth daily.   tamsulosin 0.4 MG Caps capsule Commonly known as: FLOMAX Take 1 capsule (0.4 mg total) by mouth daily.   Turmeric Curcumin 500 MG  Caps Take 500 mg by mouth daily.   vitamin B-12 1000 MCG tablet Commonly known as: CYANOCOBALAMIN Take 2,000 mcg by mouth daily.   Vitamin D3 50 MCG (2000 UT) capsule Take 4,000 Units by mouth daily.       ASK your doctor about these medications    atorvastatin 40 MG tablet Commonly known as: LIPITOR TAKE 1 TABLET BY MOUTH IN  THE EVENING        Discharge Assessment: Vitals:   07/06/20 1959 07/07/20 0544  BP: (!) 163/66 (!) 167/75  Pulse: 62 61  Resp: 16 18  Temp: 97.9 F (36.6 C) 98 F (36.7 C)  SpO2: 96% 98%   Skin clean, dry and intact without evidence of skin break down, no evidence of skin tears noted. IV catheter discontinued intact. Site without signs and symptoms of complications - no redness or edema noted at insertion site, patient denies c/o pain - only slight tenderness at site.  Dressing with slight pressure applied.  D/c Instructions-Education: Discharge instructions given to patient/family with verbalized understanding. D/c education completed with patient/family including follow up instructions, medication list, d/c activities limitations if indicated, with other d/c instructions as indicated by MD - patient able to verbalize understanding, all questions fully answered. Patient instructed to return to ED, call 911, or call MD for any changes in condition.  Patient escorted via [place], and D/C home via private auto.  Dorcas Mcmurray, LPN 1/97/5883 25:49 PM

## 2020-07-07 NOTE — Discharge Summary (Signed)
Physician Discharge Summary [facility]    Patient: [name]                   Admit date: 07/01/2020   DOB: 25-Dec-1940             Discharge date:07/07/2020/9:33 AM QPY:[id number]                          PCP: Dettinger, Fransisca Kaufmann, MD  Disposition: HOME Recommendations for Outpatient Follow-up:   Follow up: with PCP,  Discharge Condition: Stable   Code Status:   Code Status: Full Code  Diet recommendation: Cardiac diet   Discharge Diagnoses:    Principal Problem:   Viral meningitis Active Problems:   Hyperlipidemia associated with type 2 diabetes mellitus ([place])   Hypertension associated with diabetes ([place])   CORONARY ATHEROSCLEROSIS NATIVE CORONARY ARTERY   GERD (gastroesophageal reflux disease)   BPH (benign prostatic hyperplasia)   Acute febrile illness   Hyperglycemia due to diabetes mellitus ([place])   Generalized weakness   Thoracic ascending aortic aneurysm (HCC)   Hyponatremia   Obstructive sleep apnea   Dementia ([place])   Dehydration   Leukocytosis   Encephalopathy   History of Present Illness/ Hospital Course [name] Summary:   [name] is a 80 y.o. male with medical history significant for CAD status post and placement, T2DM, GERD, hypertension, hyperlipidemia, BPH, dementia and OSA on CPAP who presents to the emergency department due to 1 day onset of fatigue, fever, body aches, headache, vomiting and generalized weakness.  He was recently exposed to people with COVID while bear hunting in [place], he denies any tick bite, but was complaining of generalized weakness and nausea limiting his ambulation.  Patient had a virtual visit with his PCP who asked him to call EMS if symptoms worsen, so this resulted in patient calling EMS.  He was already vaccinated against COVID and also already had a booster.   ED Course:  In the emergency department, he was reported to be febrile, BP was elevated at 164/83.  Work-up in the ED showed mild  leukocytosis and normocytic anemia, BMP showed hyponatremia and hyperglycemia, troponin x2 was negative, lactic acid x2 was negative.  BNP was 51.  Influenza A, B, SARS coronavirus 2 was negative. CT of chest, abdomen and pelvis with contrast showed stable ascending thoracic aortic aneurysm 4.3 cm diameter, but showed no acute abnormalities. Chest x-ray showed limited evaluation due to low lung volumes and lordotic positioning without evidence of acute cardiopulmonary disease. IV hydration was provided, Tylenol and ibuprofen were given.     Assessment & Plan:     Viral meningitis Encephalopathy likely metabolic versus toxic metabolic CSF consistent with a viral meningitis -Mentation at baseline, afebrile normotensive denies any headaches or visual changes   -Status post lumbar puncture on 07/03/2020-pending cultures fluid analysis, elevated WBC -Infectious disease Dr. Gale Journey was consulted recommended: -  IV acyclovir until HSV results finally came back on 07/07/2020 negative-patient received total of 5 days of IV acyclovir Otherwise preliminary CSF report is consistent with viral meningitis   -We will continue with neurochecks--- mentation is much improved   -Status post IV fluid resuscitation... Discontinued 07/07/2020 -Due to history of previous viral meningitis, will pursue with obtaining a lumbar puncture    CSF labs and cytology cultures remarkable -Anticipating HSV results in a.m. 07/07/2020   Urine tract infection -Urine culture revealing E. coli, pending sensitivity -Patient has been  on antibiotics (tied Augmentin to Rocephin to Keflex) -Patient reported itchiness with Keflex, switched to ciprofloxacin based on sensitivity Remained stable... Continue Cipro for 5 more days   poor p.o. intake -Mentation improving, encouraging p.o. intake -IV fluid continue..  Discontinued Tolerating p.o.   Generalized weakness in the setting of acute febrile illness Improved This is possibly  secondary to a viral illness Patient has a history of viral meningitis in the past -Titer for Lyme disease and [place] spotted disease was sent -Empiric antibiotics Augmentin initiated Continue Tylenol as needed Continue Zofran as needed Continue supportive measures Continue fall precaution and neurochecks Continue PT/OT consulted recommending home health    Accelerated hypertension Continue home medication of amlodipine, Imdur Resume home regimen -As needed hydralazine -Monitor blood pressure at home   Hyponatremia/dehydration Na 132, 131 >> 137,   continue IV hydration -Continue monitoring, especially encouraging p.o. intake  Thoracic aortic aneurysm /history of aortic valve replacement -Per patient also reporting h/o aortic valve replacement CT chest, abdomen and pelvis with contrast showed stable ascending thoracic aortic aneurysm 4.3 cm diameter Annual imaging follow-up by CTA or MRA recommended Stable  CAD s/p post stent placement Continue aspirin, Lipitor, nitroglycerin, Imdur Stable  GERD Continue famotidine Stable  Hyperlipidemia Continue Lipitor -stable  Hyperglycemia secondary to T2DM Continue insulin sliding scale and hypoglycemia protocol Anticipating increasing Lantus to 60 units nightly Holding metformin, stable blood sugars  Obstructive sleep apnea on CPAP Continue CPAP Refused CPAP overnight, on O2 by nasal cannula, stable  Dementia Continue Aricept     --------------------------------------------------------------------------------------------------------------------------------------------------- Cultures; Lyme titer, [place] spotted disease antibody LP: CSF cytology gradient 200 WBC, lymphocytes,... HSV- Negative  Urine culture positive for E. coli, resistant to ampicillin,   Antimicrobials: IV Rocephin switched to p.o. ciprofloxacin     Consultants: ID       ------------------------------------------------------------------------------------------------------------------------------------------------   DVT prophylaxis:  SCD/Compression stockings and Lovenox SQ Code Status:   Code Status: Full Code   Family Communication: Discussed with his wife at bedside in detail The above findings and plan of care has been discussed with patient (and family)  in detail, they expressed understanding and agreement of above. -Advance care planning has been discussed.   Admission status:     Dispo: The patient is from: Home              Anticipated d/c is to: Home     Discharge Instructions:   Discharge Instructions     Activity as tolerated - No restrictions   Complete by: As directed    Diet - low sodium heart healthy   Complete by: As directed    Discharge instructions   Complete by: As directed    F/up with PCP in 1-2 wks .Marland Kitchen   Increase activity slowly   Complete by: As directed         Medication List     STOP taking these medications    amoxicillin-clavulanate 875-125 MG tablet Commonly known as: Augmentin       TAKE these medications    acetaminophen 650 MG CR tablet Commonly known as: TYLENOL Take 1,300 mg by mouth 2 (two) times daily.   amLODipine 10 MG tablet Commonly known as: NORVASC TAKE 1 TABLET BY MOUTH  DAILY   aspirin EC 81 MG tablet Take 81 mg by mouth at bedtime.   butalbital-acetaminophen-caffeine 50-325-40 MG tablet Commonly known as: FIORICET Take 1 tablet by mouth 2 (two) times daily as needed for headache.   cetirizine 10 MG tablet  Commonly known as: ZYRTEC Take 10 mg by mouth daily with supper.   ciprofloxacin 250 MG tablet Commonly known as: CIPRO Take 1 tablet (250 mg total) by mouth 2 (two) times daily for 5 days.   Dexcom G6 Receiver Devi USE TO CHECK BLOOD SUGAR UP TO 4 TIMES DAILY AS DIRECTED. 1 DEVICE PER YEAR. DX: E11.9   Dexcom G6 Sensor Misc USE TO CHECK BLOOD SUGAR UP TO 4  TIMES DAILY AS DIRECTED. CHANGE SENSOR EVERY 30 DAYS. DX: E11.9   Dexcom G6 Transmitter Misc USE TO CHECK BLOOD SUGAR UP TO 4 TIMES DAILY AS DIRECTED. CHANGE TRANSMITTER EVERY 3 MONTHS. DX: E11.9   docusate sodium 100 MG capsule Commonly known as: COLACE Take 100 mg by mouth daily as needed for mild constipation or moderate constipation.   donepezil 5 MG tablet Commonly known as: ARICEPT Take 1 tablet (5 mg total) by mouth at bedtime.   famotidine 20 MG tablet Commonly known as: PEPCID Take 1 tablet (20 mg total) by mouth 2 (two) times daily with a meal.   Fish Oil 1200 MG Caps Take 1,200-2,400 mg by mouth See admin instructions. Tale 1200 mg in the morning and 2400 mg in the evening   furosemide 20 MG tablet Commonly known as: LASIX TAKE 1 TABLET BY MOUTH  DAILY AS NEEDED What changed: reasons to take this   insulin lispro 100 UNIT/ML injection Commonly known as: HUMALOG Inject 15-20 Units into the skin 3 (three) times daily as needed for high blood sugar.   isosorbide mononitrate 30 MG 24 hr tablet Commonly known as: IMDUR TAKE 1 TABLET BY MOUTH IN  THE EVENING What changed: when to take this   Lantus SoloStar 100 UNIT/ML Solostar Pen Generic drug: insulin glargine INJECT SUBCUTANEOUSLY 56 UNITS AT BEDTIME What changed:  how much to take how to take this when to take this additional instructions   metFORMIN 500 MG 24 hr tablet Commonly known as: GLUCOPHAGE-XR Take 2 tablets (1,000 mg total) by mouth 2 (two) times daily.   nitroGLYCERIN 0.4 MG SL tablet Commonly known as: NITROSTAT DISSOLVE ONE TABLET UNDER THE TONGUE EVERY 5 MINUTES AS NEEDED FOR CHEST PAIN.  DO NOT EXCEED A TOTAL OF 3 DOSES IN 15 MINUTES What changed:  how much to take how to take this when to take this reasons to take this additional instructions   OneTouch Ultra test strip Generic drug: glucose blood Check BS TID Dx E11.59   PARoxetine 20 MG tablet Commonly known as: PAXIL Take 1  tablet (20 mg total) by mouth daily.   tamsulosin 0.4 MG Caps capsule Commonly known as: FLOMAX Take 1 capsule (0.4 mg total) by mouth daily.   Turmeric Curcumin 500 MG Caps Take 500 mg by mouth daily.   vitamin B-12 1000 MCG tablet Commonly known as: CYANOCOBALAMIN Take 2,000 mcg by mouth daily.   Vitamin D3 50 MCG (2000 UT) capsule Take 4,000 Units by mouth daily.       ASK your doctor about these medications    atorvastatin 40 MG tablet Commonly known as: LIPITOR TAKE 1 TABLET BY MOUTH IN  THE EVENING        Follow-up Information     [facility]   Specialty: Home Health Services Why: PT will call to set up your first home visit within 48 hour of discharge.               Allergies  Allergen Reactions   Ace  Inhibitors Hives, Swelling and Rash    Rash,hives,tongue swelling   Angiotensin Receptor Blockers     Unknown reaction    Ciprofloxacin Other (See Comments)    confusion   Oxycodone-Acetaminophen Other (See Comments)    Unknown reaction   Robaxin [Methocarbamol] Other (See Comments)    Confusion    Toradol [Ketorolac Tromethamine] Other (See Comments)    confusion   Valium Other (See Comments)    Hallucinations; confusion   Doxycycline Hives and Rash     Procedures /Studies:   CT CHEST ABDOMEN PELVIS W CONTRAST  Result Date: 07/01/2020 CLINICAL DATA:  Abdominal pain and fever, history coronary artery disease post MI at type II diabetes mellitus, hypertension, GERD, former smoker EXAM: CT CHEST, ABDOMEN, AND PELVIS WITH CONTRAST TECHNIQUE: Multidetector CT imaging of the chest, abdomen and pelvis was performed following the standard protocol during bolus administration of intravenous contrast. Sagittal and coronal MPR images reconstructed from axial data set. CONTRAST:  138mL OMNIPAQUE IOHEXOL 300 MG/ML SOLN IV. No oral contrast administered. COMPARISON:  11/15/2019 FINDINGS: CT CHEST FINDINGS Cardiovascular:  Atherosclerotic calcifications aorta, proximal great vessels and coronary arteries. Ascending thoracic aorta dilated 4.3 cm diameter unchanged. Prior TAVR. Heart otherwise unremarkable. No pericardial effusion. Pulmonary arteries grossly patent on non targeted exam. Mediastinum/Nodes: Base of cervical region normal appearance. Esophagus unremarkable. No thoracic adenopathy. Lungs/Pleura: Dependent atelectasis in the posterior lungs. Remaining lungs clear. No infiltrate, pleural effusion, or pneumothorax. Musculoskeletal: Mild degenerative disc disease changes thoracic spine. No acute osseous findings. CT ABDOMEN PELVIS FINDINGS Hepatobiliary: Gallbladder surgically absent. Liver normal appearance. Pancreas: Normal appearance Spleen: Normal appearance Adrenals/Urinary Tract: Adrenal glands normal appearance. BILATERAL renal cysts. Tiny nonobstructing LEFT renal calculus. Kidneys, ureters, and bladder otherwise normal appearance Stomach/Bowel: Normal appendix. Stomach and bowel loops normal appearance. Vascular/Lymphatic: Atherosclerotic calcifications aorta and iliac arteries without aneurysm. No adenopathy. Reproductive: Prostate gland and seminal vesicles obscured by beam hardening artifacts in pelvis Other: No free air or free fluid. No hernia or inflammatory process. Musculoskeletal: BILATERAL hip prostheses. No acute osseous findings. IMPRESSION: Stable ascending thoracic aortic aneurysm 4.3 cm diameter; Recommend annual imaging followup by CTA or MRA. This recommendation follows 2010 ACCF/AHA/AATS/ACR/ASA/SCA/SCAI/SIR/STS/SVM Guidelines for the Diagnosis and Management of Patients with Thoracic Aortic Disease. Circulation. 2010; 121: E720-N470. Aortic aneurysm NOS (ICD10-I71.9) . Prior TAVR. BILATERAL renal cysts with tiny nonobstructing LEFT renal calculus. No acute abnormalities. Aortic Atherosclerosis (ICD10-I70.0). Electronically Signed   By: Lavonia Dana M.D.   On: 07/01/2020 15:29   DG Chest Port 1  View  Result Date: 07/01/2020 CLINICAL DATA:  Shortness of breath. EXAM: PORTABLE CHEST 1 VIEW COMPARISON:  December 04, 2019. FINDINGS: Low lung volumes. Enlarged cardiac silhouette, accentuated by AP technique and low lung volumes. No visible pleural effusions or pneumothorax. Hazy bibasilar opacities likely represents accenuated diaphragmatic/mediastinal fat given lordotic positioning. IMPRESSION: 1. Limited evaluation due to low lung volumes and lordotic positioning without evidence of acute cardiopulmonary disease. Dedicated PA and lateral radiographs could better evaluate the lung bases if clinically indicated. 2. Cardiomegaly. Electronically Signed   By: Margaretha Sheffield MD   On: 07/01/2020 13:27   DG FLUORO GUIDE LUMBAR PUNCTURE  Result Date: 07/03/2020 CLINICAL DATA:  Altered mental status, fever, history of meningitis EXAM: DIAGNOSTIC LUMBAR PUNCTURE UNDER FLUOROSCOPIC GUIDANCE COMPARISON:  None FLUOROSCOPY TIME:  Fluoroscopy Time:  0 minutes 18 seconds Radiation Exposure Index (if provided by the fluoroscopic device): 6.3 mGy Number of Acquired Spot Images: 1 PROCEDURE: Procedure, benefits, and risks were discussed with the patient,  including alternatives. Patient's questions were answered. Written informed consent was obtained. Timeout protocol followed. Patient placed prone. Patient post L5 laminectomy. L4-L5 disc space was localized under fluoroscopy. Skin prepped and draped in usual sterile fashion. Skin and soft tissues anesthetized with 4 mL of 1% lidocaine. 22 gauge needle was advanced into the spinal canal where clear colorless CSF was encountered with an opening pressure of 12 cm H2O (measured prone not LLD). 9 mL of CSF was obtained in 4 tubes for requested analysis. Procedure tolerated very well by patient without immediate complication. IMPRESSION: Fluoroscopic guided lumbar puncture as above. Electronically Signed   By: Lavonia Dana M.D.   On: 07/03/2020 11:48    Subjective:    Patient was seen and examined 07/07/2020, 9:33 AM Patient stable today. No acute distress.  No issues overnight Stable for discharge.  Discharge Exam:    Vitals:   07/06/20 0541 07/06/20 1439 07/06/20 1959 07/07/20 0544  BP: (!) 158/69 (!) 150/70 (!) 163/66 (!) 167/75  Pulse: 61 (!) 52 62 61  Resp: 20  16 18   Temp: 98.5 F (36.9 C) 97.7 F (36.5 C) 97.9 F (36.6 C) 98 F (36.7 C)  TempSrc:  Oral Oral   SpO2: 96% 98% 96% 98%  Weight:      Height:        General: Pt lying comfortably in bed & appears in no obvious distress. Cardiovascular: S1 & S2 heard, RRR, S1/S2 +. No murmurs, rubs, gallops or clicks. No JVD or pedal edema. Respiratory: Clear to auscultation without wheezing, rhonchi or crackles. No increased work of breathing. Abdominal:  Non-distended, non-tender & soft. No organomegaly or masses appreciated. Normal bowel sounds heard. CNS: Alert and oriented. No focal deficits. Extremities: no edema, no cyanosis      The results of significant diagnostics from this hospitalization (including imaging, microbiology, ancillary and laboratory) are listed below for reference.      Microbiology:   Recent Results (from the past 240 hour(s))  Resp Panel by RT-PCR (Flu A&B, Covid) Nasopharyngeal Swab     Status: None   Collection Time: 07/01/20 12:28 PM   Specimen: Nasopharyngeal Swab; Nasopharyngeal(NP) swabs in vial transport medium  Result Value Ref Range Status   SARS Coronavirus 2 by RT PCR NEGATIVE NEGATIVE Final    Comment: (NOTE) SARS-CoV-2 target nucleic acids are NOT DETECTED.  The SARS-CoV-2 RNA is generally detectable in upper respiratory specimens during the acute phase of infection. The lowest concentration of SARS-CoV-2 viral copies this assay can detect is 138 copies/mL. A negative result does not preclude SARS-Cov-2 infection and should not be used as the sole basis for treatment or other patient management decisions. A negative result may occur  with  improper specimen collection/handling, submission of specimen other than nasopharyngeal swab, presence of viral mutation(s) within the areas targeted by this assay, and inadequate number of viral copies(<138 copies/mL). A negative result must be combined with clinical observations, patient history, and epidemiological information. The expected result is Negative.  Fact Sheet for Patients:  EntrepreneurPulse.com.au  Fact Sheet for Healthcare Providers:  IncredibleEmployment.be  This test is no t yet approved or cleared by the [place] FDA and  has been authorized for detection and/or diagnosis of SARS-CoV-2 by FDA under an Emergency Use Authorization (EUA). This EUA will remain  in effect (meaning this test can be used) for the duration of the COVID-19 declaration under Section 564(b)(1) of the Act, 21 U.S.C.section 360bbb-3(b)(1), unless the authorization is terminated  or revoked sooner.  Influenza A by PCR NEGATIVE NEGATIVE Final   Influenza B by PCR NEGATIVE NEGATIVE Final    Comment: (NOTE) The Xpert Xpress SARS-CoV-2/FLU/RSV plus assay is intended as an aid in the diagnosis of influenza from Nasopharyngeal swab specimens and should not be used as a sole basis for treatment. Nasal washings and aspirates are unacceptable for Xpert Xpress SARS-CoV-2/FLU/RSV testing.  Fact Sheet for Patients: EntrepreneurPulse.com.au  Fact Sheet for Healthcare Providers: IncredibleEmployment.be  This test is not yet approved or cleared by the [place] FDA and has been authorized for detection and/or diagnosis of SARS-CoV-2 by FDA under an Emergency Use Authorization (EUA). This EUA will remain in effect (meaning this test can be used) for the duration of the COVID-19 declaration under Section 564(b)(1) of the Act, 21 U.S.C. section 360bbb-3(b)(1), unless the authorization is terminated  or revoked.  Performed at [facility], [address] [name], [place] [address]   Culture, blood (routine x 2)     Status: None   Collection Time: 07/01/20 12:42 PM   Specimen: BLOOD RIGHT HAND  Result Value Ref Range Status   Specimen Description   Final    BLOOD RIGHT HAND BOTTLES DRAWN AEROBIC AND ANAEROBIC   Special Requests Blood Culture adequate volume  Final   Culture   Final    NO GROWTH 5 DAYS Performed at [facility], [address] [place], [place] [address]    Report Status 07/06/2020 FINAL  Final  Culture, blood (routine x 2)     Status: None   Collection Time: 07/01/20  1:00 PM   Specimen: BLOOD RIGHT WRIST  Result Value Ref Range Status   Specimen Description   Final    BLOOD RIGHT WRIST BOTTLES DRAWN AEROBIC AND ANAEROBIC   Special Requests Blood Culture adequate volume  Final   Culture   Final    NO GROWTH 5 DAYS Performed at [facility], [address], [place], [place] [address]    Report Status 07/06/2020 FINAL  Final  Urine culture     Status: Abnormal   Collection Time: 07/01/20  2:17 PM   Specimen: Urine, Clean Catch  Result Value Ref Range Status   Specimen Description   Final    URINE, CLEAN CATCH Performed at [facility], [address] [place], [place] [address]    Special Requests   Final    NONE Performed at [facility], [address], [place], [place] [address]    Culture >=100,000 COLONIES/mL ESCHERICHIA COLI (A)  Final   Report Status 07/04/2020 FINAL  Final   Organism ID, Bacteria ESCHERICHIA COLI (A)  Final      Susceptibility   Escherichia coli - MIC*    AMPICILLIN >=32 RESISTANT Resistant     CEFAZOLIN 8 SENSITIVE Sensitive     CEFEPIME <=0.12 SENSITIVE Sensitive     CEFTRIAXONE 0.5 SENSITIVE Sensitive     CIPROFLOXACIN <=0.25 SENSITIVE Sensitive     GENTAMICIN <=1 SENSITIVE Sensitive     IMIPENEM <=0.25 SENSITIVE Sensitive     NITROFURANTOIN <=16 SENSITIVE Sensitive     TRIMETH/SULFA <=20 SENSITIVE Sensitive      AMPICILLIN/SULBACTAM >=32 RESISTANT Resistant     PIP/TAZO 8 SENSITIVE Sensitive     * >=100,000 COLONIES/mL ESCHERICHIA COLI  Culture, blood (routine x 2)     Status: None   Collection Time: 07/02/20 12:07 PM   Specimen: BLOOD  Result Value Ref Range Status   Specimen Description   Final    BLOOD BLOOD LEFT HAND  Performed at [facility], [place] [address] [place], [place] [address]    Special Requests   Final    BOTTLES DRAWN AEROBIC AND ANAEROBIC Blood Culture adequate volume Performed at [facility], [place] [address] [place], [place] [address]    Culture   Final    NO GROWTH 5 DAYS Performed at [facility], [address] [place], [place] [address]    Report Status 07/07/2020 FINAL  Final  Culture, blood (routine x 2)     Status: None   Collection Time: 07/02/20 12:07 PM   Specimen: BLOOD  Result Value Ref Range Status   Specimen Description   Final    BLOOD BLOOD LEFT WRIST Performed at [facility], [place] [address], [place], [place] [address]    Special Requests   Final    BOTTLES DRAWN AEROBIC AND ANAEROBIC Blood Culture results may not be optimal due to an inadequate volume of blood received in culture bottles Performed at [facility], [address] [place], [place] [address]    Culture   Final    NO GROWTH 5 DAYS Performed at [facility], [address] [place], [place] [address]    Report Status 07/07/2020 FINAL  Final  CSF culture w Gram Stain     Status: None   Collection Time: 07/03/20 10:50 AM   Specimen: CSF; Cerebrospinal Fluid  Result Value Ref Range Status   Specimen Description   Final    CSF Performed at [place] [address], [place], [place] [address]    Special Requests   Final    NONE Performed at [facility], [address], [place], [place] [address]    Gram Stain   Final    WBC PRESENT, PREDOMINANTLY MONONUCLEAR NO ORGANISMS  SEEN CYTOSPIN SMEAR    Culture   Final    NO GROWTH 3 DAYS Performed at [facility], [place] [address] [place], [place] [address]    Report Status 07/06/2020 FINAL  Final  Culture, fungus without smear     Status: None (Preliminary result)   Collection Time: 07/03/20 10:50 AM   Specimen: Lumbar Puncture; Cerebrospinal Fluid  Result Value Ref Range Status   Specimen Description   Final    LUMBAR Performed at [facility], [address], [place], [place] [address]    Special Requests   Final    NONE Performed at [facility], [address] [place], [place] [address]    Culture   Final    NO FUNGUS ISOLATED AFTER 3 DAYS Performed at [facility], [place] [address], [place], [place] [address]    Report Status PENDING  Incomplete  Anaerobic culture w Gram Stain     Status: None (Preliminary result)   Collection Time: 07/03/20 10:50 AM   Specimen: Lumbar Puncture; Cerebrospinal Fluid  Result Value Ref Range Status   Specimen Description   Final    LUMBAR Performed at [facility], [address] [place], [place] [address]    Special Requests   Final    NONE Performed at [facility], [address] [place], [place] [address]    Gram Stain   Final    WBC PRESENT, PREDOMINANTLY MONONUCLEAR NO ORGANISMS SEEN CYTOSPIN SMEAR Performed at [facility], [place] [address], [place], [place] [address]    Culture   Final    NO ANAEROBES ISOLATED; CULTURE IN PROGRESS FOR  5 DAYS   Report Status PENDING  Incomplete     Labs:   CBC: Recent Labs  Lab 07/01/20 1227 07/02/20 0612 07/02/20 1338 07/07/20 0645  WBC 11.1* 11.0* 11.3* 7.7  NEUTROABS 8.8*  --  9.0*  --   HGB 12.9* 14.7 13.6 12.2*  HCT 40.9 46.9 43.2 38.2*  MCV 88.3 89.0 89.1 87.8  PLT 333 301 274 779   Basic Metabolic Panel: Recent Labs  Lab 07/02/20 0612 07/02/20 1338 07/04/20 0620 07/05/20 0637 07/06/20 0625 07/07/20 0645  NA 135 131* 137 135 136 137  K 4.0 3.7 3.5 3.7 3.1* 3.5  CL 101 100  104 101 102 104  CO2 26 23 27 28 28 27   GLUCOSE 237* 313* 241* 209* 166* 192*  BUN 12 14 13 9  7* 11  CREATININE 0.83 0.86 0.97 0.95 0.87 0.82  CALCIUM 8.4* 8.4* 8.5* 8.3* 8.2* 8.5*  MG 1.7  --   --   --   --   --   PHOS 2.3*  --   --   --   --   --    Liver Function Tests: Recent Labs  Lab 07/02/20 0612 07/02/20 1338  AST 18 18  ALT 18 18  ALKPHOS 74 74  BILITOT 1.0 0.7  PROT 7.3 7.0  ALBUMIN 3.6 3.4*   BNP (last 3 results) Recent Labs    11/30/19 1411 07/01/20 1227  BNP 23.4 51.0   Cardiac Enzymes: No results for input(s): CKTOTAL, CKMB, CKMBINDEX, TROPONINI in the last 168 hours. CBG: Recent Labs  Lab 07/05/20 1624 07/05/20 2009 07/06/20 0720 07/06/20 1115 07/07/20 0750  GLUCAP 200* 306* 155* 257* 170*   Hgb A1c No results for input(s): HGBA1C in the last 72 hours. Lipid Profile No results for input(s): CHOL, HDL, LDLCALC, TRIG, CHOLHDL, LDLDIRECT in the last 72 hours. Thyroid function studies No results for input(s): TSH, T4TOTAL, T3FREE, THYROIDAB in the last 72 hours.  Invalid input(s): FREET3 Anemia work up No results for input(s): VITAMINB12, FOLATE, FERRITIN, TIBC, IRON, RETICCTPCT in the last 72 hours. Urinalysis    Component Value Date/Time   COLORURINE YELLOW 07/01/2020 1416   APPEARANCEUR CLEAR 07/01/2020 1416   LABSPEC 1.020 07/01/2020 1416   PHURINE 7.0 07/01/2020 1416   GLUCOSEU >=500 (A) 07/01/2020 1416   HGBUR MODERATE (A) 07/01/2020 1416   BILIRUBINUR NEGATIVE 07/01/2020 1416   BILIRUBINUR neg 12/10/2014 1435   KETONESUR 20 (A) 07/01/2020 1416   PROTEINUR 100 (A) 07/01/2020 1416   UROBILINOGEN negative 12/10/2014 1435   UROBILINOGEN 0.2 12/09/2008 1006   NITRITE NEGATIVE 07/01/2020 1416   LEUKOCYTESUR NEGATIVE 07/01/2020 1416         Time coordinating discharge: Over 45 minutes  SIGNED: Deatra James, MD, FACP, FHM. [facility],  Please use [email or web address] to Page If 7PM-7AM, please contact  night-coverage [email or web address] [facility] 07/07/2020, 9:33 AM

## 2020-07-08 LAB — ANAEROBIC CULTURE W GRAM STAIN

## 2020-07-09 LAB — ROCKY MTN SPOTTED FVR ABS PNL(IGG+IGM)
RMSF IgG: POSITIVE — AB
RMSF IgM: 0.24 index (ref 0.00–0.89)

## 2020-07-09 LAB — RMSF, IGG, IFA: RMSF, IGG, IFA: <1:64 {titer}

## 2020-07-10 NOTE — Progress Notes (Signed)
Pt aware by detailed VM - also seen on mychart

## 2020-07-11 LAB — ROCKY MTN SPOTTED FVR ABS PNL(IGG+IGM)
RMSF IgG: POSITIVE — AB
RMSF IgM: 0.32 index (ref 0.00–0.89)

## 2020-07-11 LAB — RMSF, IGG, IFA: RMSF, IGG, IFA: 1:64 {titer} — ABNORMAL HIGH

## 2020-07-15 DIAGNOSIS — J61 Pneumoconiosis due to asbestos and other mineral fibers: Secondary | ICD-10-CM | POA: Insufficient documentation

## 2020-07-15 DIAGNOSIS — R0602 Shortness of breath: Secondary | ICD-10-CM | POA: Insufficient documentation

## 2020-07-18 ENCOUNTER — Encounter: Payer: Self-pay | Admitting: Family Medicine

## 2020-07-18 ENCOUNTER — Other Ambulatory Visit: Payer: Self-pay

## 2020-07-18 ENCOUNTER — Ambulatory Visit (INDEPENDENT_AMBULATORY_CARE_PROVIDER_SITE_OTHER): Payer: Medicare Other | Admitting: Family Medicine

## 2020-07-18 VITALS — BP 144/61 | HR 72 | Ht 66.0 in | Wt 230.0 lb

## 2020-07-18 DIAGNOSIS — E860 Dehydration: Secondary | ICD-10-CM | POA: Diagnosis not present

## 2020-07-18 DIAGNOSIS — Z8661 Personal history of infections of the central nervous system: Secondary | ICD-10-CM

## 2020-07-18 DIAGNOSIS — G9340 Encephalopathy, unspecified: Secondary | ICD-10-CM

## 2020-07-18 DIAGNOSIS — N390 Urinary tract infection, site not specified: Secondary | ICD-10-CM

## 2020-07-18 LAB — CBC WITH DIFFERENTIAL/PLATELET
Basophils Absolute: 0.1 10*3/uL (ref 0.0–0.2)
Basos: 1 %
EOS (ABSOLUTE): 0.2 10*3/uL (ref 0.0–0.4)
Eos: 3 %
Hematocrit: 39.7 % (ref 37.5–51.0)
Hemoglobin: 12.8 g/dL — ABNORMAL LOW (ref 13.0–17.7)
Immature Grans (Abs): 0 10*3/uL (ref 0.0–0.1)
Immature Granulocytes: 0 %
Lymphocytes Absolute: 1.7 10*3/uL (ref 0.7–3.1)
Lymphs: 25 %
MCH: 27.9 pg (ref 26.6–33.0)
MCHC: 32.2 g/dL (ref 31.5–35.7)
MCV: 87 fL (ref 79–97)
Monocytes Absolute: 0.5 10*3/uL (ref 0.1–0.9)
Monocytes: 8 %
Neutrophils Absolute: 4.2 10*3/uL (ref 1.4–7.0)
Neutrophils: 63 %
Platelets: 299 10*3/uL (ref 150–450)
RBC: 4.59 x10E6/uL (ref 4.14–5.80)
RDW: 15 % (ref 11.6–15.4)
WBC: 6.7 10*3/uL (ref 3.4–10.8)

## 2020-07-18 LAB — CMP14+EGFR
ALT: 17 IU/L (ref 0–44)
AST: 13 IU/L (ref 0–40)
Albumin/Globulin Ratio: 1.3 (ref 1.2–2.2)
Albumin: 3.9 g/dL (ref 3.7–4.7)
Alkaline Phosphatase: 77 IU/L (ref 44–121)
BUN/Creatinine Ratio: 11 (ref 10–24)
BUN: 11 mg/dL (ref 8–27)
Bilirubin Total: 0.5 mg/dL (ref 0.0–1.2)
CO2: 21 mmol/L (ref 20–29)
Calcium: 8.9 mg/dL (ref 8.6–10.2)
Chloride: 101 mmol/L (ref 96–106)
Creatinine, Ser: 1.02 mg/dL (ref 0.76–1.27)
Globulin, Total: 2.9 g/dL (ref 1.5–4.5)
Glucose: 176 mg/dL — ABNORMAL HIGH (ref 65–99)
Potassium: 4.2 mmol/L (ref 3.5–5.2)
Sodium: 139 mmol/L (ref 134–144)
Total Protein: 6.8 g/dL (ref 6.0–8.5)
eGFR: 74 mL/min/{1.73_m2} (ref >59)

## 2020-07-18 NOTE — Progress Notes (Signed)
BP (!) 144/61   Pulse 72   Ht '5\' 6"'  (1.676 m)   Wt 230 lb (104.3 kg)   SpO2 97%   BMI 37.12 kg/m    Subjective:   Patient ID: [name], male    DOB: 1940-02-15, 80 y.o.   MRN: [id number]  HPI: [name] is a 80 y.o. male presenting on 07/18/2020 for Transitions Of Care   HPI Contact Date: 07/07/2020 Contacted By: Felicity Coyer, LPN   Transition Care Management Follow-up Telephone Call Date of discharge and from where: 07/07/2020 [facility] Discharge Diagnosis:Viral meningitis How have you been since you were released from the hospital? Just came home today, resting Any questions or concerns? No  TOCM Patient was admitted arm 6/21 and discharged on 6/27 she was diagnosed with viral meningitis and nausea and vomiting.  He also was diagnosed with a UTI at that time and dehydration. Patient is still sleepy, doing well.  Patient's thought is that he has been doing well except for the energy.  Denies any major symptoms currently.  Denies any urinary symptoms or confusion and says he is feeling back to normal.  He says his blood sugars are getting better since he got out of the hospital and back under control.  He denies hypoglycemia or hypotension episodes that he knows of.  He denies any headaches or dizziness or lightheadedness.  Relevant past medical, surgical, family and social history reviewed and updated as indicated. Interim medical history since our last visit reviewed. Allergies and medications reviewed and updated.  Review of Systems  Constitutional:  Negative for chills and fever.  Eyes:  Negative for visual disturbance.  Respiratory:  Negative for shortness of breath and wheezing.   Cardiovascular:  Negative for chest pain and leg swelling.  Musculoskeletal:  Negative for back pain and gait problem.  Skin:  Negative for rash.  Neurological:  Negative for dizziness, weakness and numbness.  All other systems reviewed and are negative.  Per HPI unless  specifically indicated above   Allergies as of 07/18/2020       Reactions   Ace Inhibitors Hives, Swelling, Rash   Rash,hives,tongue swelling   Angiotensin Receptor Blockers    Unknown reaction    Oxycodone-acetaminophen Other (See Comments)   Unknown reaction   Robaxin [methocarbamol] Other (See Comments)   Confusion    Toradol [ketorolac Tromethamine] Other (See Comments)   confusion   Valium Other (See Comments)   Hallucinations; confusion   Doxycycline Hives, Rash        Medication List        Accurate as of July 18, 2020  9:53 AM. If you have any questions, ask your nurse or doctor.          acetaminophen 650 MG CR tablet Commonly known as: TYLENOL Take 1,300 mg by mouth 2 (two) times daily.   amLODipine 10 MG tablet Commonly known as: NORVASC TAKE 1 TABLET BY MOUTH  DAILY   aspirin EC 81 MG tablet Take 81 mg by mouth at bedtime.   atorvastatin 40 MG tablet Commonly known as: LIPITOR TAKE 1 TABLET BY MOUTH IN  THE EVENING What changed: when to take this   butalbital-acetaminophen-caffeine 50-325-40 MG tablet Commonly known as: FIORICET Take 1 tablet by mouth 2 (two) times daily as needed for headache.   cetirizine 10 MG tablet Commonly known as: ZYRTEC Take 10 mg by mouth daily with supper.   Dexcom G6 Receiver Devi USE TO CHECK BLOOD SUGAR UP  TO 4 TIMES DAILY AS DIRECTED. 1 DEVICE PER YEAR. DX: E11.9   Dexcom G6 Sensor Misc USE TO CHECK BLOOD SUGAR UP TO 4 TIMES DAILY AS DIRECTED. CHANGE SENSOR EVERY 30 DAYS. DX: E11.9   Dexcom G6 Transmitter Misc USE TO CHECK BLOOD SUGAR UP TO 4 TIMES DAILY AS DIRECTED. CHANGE TRANSMITTER EVERY 3 MONTHS. DX: E11.9   docusate sodium 100 MG capsule Commonly known as: COLACE Take 100 mg by mouth daily as needed for mild constipation or moderate constipation.   donepezil 5 MG tablet Commonly known as: ARICEPT Take 1 tablet (5 mg total) by mouth at bedtime.   famotidine 20 MG tablet Commonly known as:  PEPCID Take 1 tablet (20 mg total) by mouth 2 (two) times daily with a meal.   Fish Oil 1200 MG Caps Take 1,200-2,400 mg by mouth See admin instructions. Tale 1200 mg in the morning and 2400 mg in the evening   furosemide 20 MG tablet Commonly known as: LASIX TAKE 1 TABLET BY MOUTH  DAILY AS NEEDED   insulin lispro 100 UNIT/ML injection Commonly known as: HUMALOG Inject 15-20 Units into the skin 3 (three) times daily as needed for high blood sugar.   isosorbide mononitrate 30 MG 24 hr tablet Commonly known as: IMDUR TAKE 1 TABLET BY MOUTH IN  THE EVENING   Lantus SoloStar 100 UNIT/ML Solostar Pen Generic drug: insulin glargine INJECT SUBCUTANEOUSLY 56 UNITS AT BEDTIME   metFORMIN 500 MG 24 hr tablet Commonly known as: GLUCOPHAGE-XR Take 2 tablets (1,000 mg total) by mouth 2 (two) times daily.   nitroGLYCERIN 0.4 MG SL tablet Commonly known as: NITROSTAT DISSOLVE ONE TABLET UNDER THE TONGUE EVERY 5 MINUTES AS NEEDED FOR CHEST PAIN.  DO NOT EXCEED A TOTAL OF 3 DOSES IN 15 MINUTES   OneTouch Ultra test strip Generic drug: glucose blood Check BS TID Dx E11.59   PARoxetine 20 MG tablet Commonly known as: PAXIL Take 1 tablet (20 mg total) by mouth daily.   tamsulosin 0.4 MG Caps capsule Commonly known as: FLOMAX Take 1 capsule (0.4 mg total) by mouth daily.   Turmeric Curcumin 500 MG Caps Take 500 mg by mouth daily.   vitamin B-12 1000 MCG tablet Commonly known as: CYANOCOBALAMIN Take 2,000 mcg by mouth daily.   Vitamin D3 50 MCG (2000 UT) capsule Take 4,000 Units by mouth daily.         Objective:   BP (!) 144/61   Pulse 72   Ht '5\' 6"'  (1.676 m)   Wt 230 lb (104.3 kg)   SpO2 97%   BMI 37.12 kg/m   Wt Readings from Last 3 Encounters:  07/18/20 230 lb (104.3 kg)  07/01/20 224 lb 3.3 oz (101.7 kg)  05/07/20 243 lb 13.3 oz (110.6 kg)    Physical Exam Vitals and nursing note reviewed.  Constitutional:      General: He is not in acute distress.     Appearance: He is well-developed. He is not diaphoretic.  Eyes:     General: No scleral icterus.    Conjunctiva/sclera: Conjunctivae normal.  Neck:     Thyroid: No thyromegaly.  Cardiovascular:     Rate and Rhythm: Normal rate and regular rhythm.     Heart sounds: Normal heart sounds. No murmur heard. Pulmonary:     Effort: Pulmonary effort is normal. No respiratory distress.     Breath sounds: Normal breath sounds. No wheezing.  Musculoskeletal:        General: Normal range  of motion.     Cervical back: Neck supple.  Lymphadenopathy:     Cervical: No cervical adenopathy.  Skin:    General: Skin is warm and dry.     Findings: No rash.  Neurological:     Mental Status: He is alert and oriented to person, place, and time.     Coordination: Coordination normal.  Psychiatric:        Behavior: Behavior normal.      Assessment & Plan:   Problem List Items Addressed This Visit       Nervous and Auditory   Encephalopathy - Primary   Relevant Orders   CBC with Differential/Platelet   CMP14+EGFR     Other   History of viral meningitis   Dehydration   Relevant Orders   CBC with Differential/Platelet   CMP14+EGFR   Other Visit Diagnoses     Urinary tract infection without hematuria, site unspecified           Will check blood work, continue with normal follow-up, usually sees them every 3 months.  Denies any urinary retention but if he has any issues in the future we may need to go see urologist.  Continues on tamsulosin. Follow up plan: Return in about 3 months (around 10/18/2020), or if symptoms worsen or fail to improve, for Diabetes.  Counseling provided for all of the vaccine components Orders Placed This Encounter  Procedures   CBC with Differential/Platelet   [place] Pavielle Biggar, MD [facility] 07/18/2020, 9:53 AM

## 2020-07-24 LAB — CULTURE, FUNGUS WITHOUT SMEAR

## 2020-07-28 DIAGNOSIS — G4733 Obstructive sleep apnea (adult) (pediatric): Secondary | ICD-10-CM | POA: Diagnosis not present

## 2020-08-01 DIAGNOSIS — H6502 Acute serous otitis media, left ear: Secondary | ICD-10-CM | POA: Diagnosis not present

## 2020-08-06 ENCOUNTER — Telehealth: Payer: Self-pay

## 2020-08-06 ENCOUNTER — Telehealth: Payer: Self-pay | Admitting: Family Medicine

## 2020-08-06 DIAGNOSIS — E119 Type 2 diabetes mellitus without complications: Secondary | ICD-10-CM

## 2020-08-06 NOTE — Telephone Encounter (Signed)
Patient was made aware of Julie's recommendations and understood. He will await a phone call for CCM referral. States that he has just requested a refill of his insulin bu will call back if samples are needed.

## 2020-08-06 NOTE — Telephone Encounter (Signed)
Can you put in a CCM referral for med assistance to continue  Will likely have to re-do applications/refills  If patient is out of medication--we have samples in fridge.  Thank you! Almyra Free

## 2020-08-06 NOTE — Progress Notes (Signed)
Cardiology Office Note  Date: 08/07/2020   ID: [name], DOB Feb 14, 1940, MRN [id number]  PCP:  Dettinger, Fransisca Kaufmann, MD  Cardiologist:  Rozann Lesches, MD Electrophysiologist:  None   Chief Complaint  Patient presents with   Cardiac follow-up    History of Present Illness: [name] is an 80 y.o. male last seen in January.  He presents for a follow-up visit.  He does not describe any active angina at this time, reports NYHA class II-III dyspnea.  He states that he has traveled to [place] in the interim, was with friends that both got COVID-33.  He was just recently hospitalized at [facility] with viral meningitis and a bacterial UTI, but tested negative for COVID-19.  Blood pressure is elevated.  We went over his medications and discussed addition of chlorthalidone.  He has an allergy to ACE inhibitor and ARB.  Currently on top dose of Norvasc, also Imdur for angina control.  Not on beta-blocker with low resting heart rate.  I reviewed his recent lab work.  Past Medical History:  Diagnosis Date   Anxiety    Aortic atherosclerosis ([place]) 12/19/2017   Aortic stenosis    26 mm Edwards S3U THV November 2021   Arthritis    Bursitis of hip    Cervical spondylosis    Chronic headaches    Coronary atherosclerosis of native coronary artery    BMS nondominant RCA 12/2004, DES x2 to mid LAD 11/2019   Essential hypertension    GERD (gastroesophageal reflux disease)    History of adenomatous polyp of colon    History of kidney stones    History of MI (myocardial infarction) 12/2004   History of viral meningitis 02/28/2018   December 2019   Hyperlipidemia    Internal hemorrhoids    Low back pain    Nocturia more than twice per night    OSA (obstructive sleep apnea)    Spinal stenosis    Type 2 diabetes mellitus treated with insulin ([place])    Wears dentures    Upper    Past Surgical History:  Procedure Laterality Date   CARDIAC CATHETERIZATION  06-14-2004  dr  Lia Foyer   total occlusion mD1, RI 30-40%, mRCA nondominant hazy 80% and scattered 30-40%,  ef 71% (medical mangement)   CARDIOVASCULAR STRESS TEST  09-17-2015   dr Domenic Polite   Low risk nuclear study w/ reversible mild small anteroapical defect,  normal LV function and wall motion, nuclear stress ef 61%   CATARACT EXTRACTION W/ INTRAOCULAR LENS IMPLANT Right 07/2014   COLONOSCOPY  09/10/2008   normal   CORONARY ANGIOPLASTY WITH STENT PLACEMENT  12-17-2004   dr benismhon/ dr Olevia Perches   nonobstructive cad LAD and LCFx,  BMS x1 to total occlusion RCA    CORONARY ATHERECTOMY  11/22/2019   CORONARY ATHERECTOMY N/A 11/22/2019   Procedure: CORONARY ATHERECTOMY;  Surgeon: Burnell Blanks, MD;  Location: [place];  Service: Cardiovascular;  Laterality: N/A;   CYSTOSCOPY W/ URETEROSCOPY W/ LITHOTRIPSY  08/2010   ERCP  03/14/2008   EYE SURGERY Bilateral    cataracts   LAPAROSCOPIC CHOLECYSTECTOMY  05/2012   LEFT HEART CATHETERIZATION WITH CORONARY ANGIOGRAM N/A 05/14/2011   Procedure: LEFT HEART CATHETERIZATION WITH CORONARY ANGIOGRAM;  Surgeon: Sherren Mocha, MD;  Location: [facility] C[facility] LAB;  Service: Cardiovascular;  Laterality: N/A;    non-obstructive LM, LAD, LCFx, patent RCA stent   LUMBAR LAMINECTOMY/DECOMPRESSION MICRODISCECTOMY N/A 02/24/2017   Procedure: Microlumbar decompression L4-5, L5-S1;  Surgeon: Susa Day, MD;  Location: [facility];  Service: Orthopedics;  Laterality: N/A;  120 mins   RIGHT/LEFT HEART CATH AND CORONARY ANGIOGRAPHY N/A 11/14/2019   Procedure: RIGHT/LEFT HEART CATH AND CORONARY ANGIOGRAPHY;  Surgeon: Burnell Blanks, MD;  Location: [place] [facility];  Service: Cardiovascular;  Laterality: N/A;   ROTATOR CUFF REPAIR Right 03/2002   ROTATOR CUFF REPAIR Left 12/11/2015   TEE WITHOUT CARDIOVERSION N/A 12/04/2019   Procedure: TRANSESOPHAGEAL ECHOCARDIOGRAM (TEE);  Surgeon: Burnell Blanks, MD;  Location: [place] [facility];  Service: Open Heart Surgery;   Laterality: N/A;   TOTAL HIP ARTHROPLASTY Right 12/20/2008   TOTAL HIP ARTHROPLASTY Left 07/05/2018   Procedure: TOTAL HIP ARTHROPLASTY ANTERIOR APPROACH;  Surgeon: Gaynelle Arabian, MD;  Location: [facility];  Service: Orthopedics;  Laterality: Left;  137mn   TOTAL KNEE ARTHROPLASTY Bilateral left 12-11-2003/  right 07[facility]31-2004   [facility]  TRANSCATHETER AORTIC VALVE REPLACEMENT, TRANSFEMORAL N/A 12/04/2019   Procedure: TRANSCATHETER AORTIC VALVE REPLACEMENT, TRANSFEMORAL;  Surgeon: MBurnell Blanks MD;  [facility];  Service: Open Heart Surgery;  Laterality: N/A;   UPPER GASTROINTESTINAL ENDOSCOPY  01/08/2008   bx, inlet patch, duodenitis   YAG LASER APPLICATION Right [id number]  Procedure: YAG LASER APPLICATION;  Surgeon: CWilliams Che MD;  Location: [facility];  Service: Ophthalmology;  Laterality: Right;    Current Outpatient Medications  Medication Sig Dispense Refill   acetaminophen (TYLENOL) 650 MG CR tablet Take 1,300 mg by mouth 2 (two) times daily.     amLODipine (NORVASC) 10 MG tablet TAKE 1 TABLET BY MOUTH  DAILY 90 tablet 3   aspirin EC 81 MG tablet Take 81 mg by mouth at bedtime.      atorvastatin (LIPITOR) 40 MG tablet TAKE 1 TABLET BY MOUTH IN  THE EVENING (Patient taking differently: Take 40 mg by mouth daily.) 90 tablet 3   butalbital-acetaminophen-caffeine (FIORICET) 50-325-40 MG tablet Take 1 tablet by mouth 2 (two) times daily as needed for headache.     cetirizine (ZYRTEC) 10 MG tablet Take 10 mg by mouth daily with supper.      chlorthalidone (HYGROTON) 25 MG tablet Take 1 tablet (25 mg total) by mouth daily. 90 tablet 3   Cholecalciferol (VITAMIN D3) 50 MCG (2000 UT) capsule Take 4,000 Units by mouth daily.      Continuous Blood Gluc Receiver (DEXCOM G6 RECEIVER) DEVI USE TO CHECK BLOOD SUGAR UP TO 4 TIMES DAILY AS DIRECTED. 1 DEVICE PER YEAR. DX: E11.9 1 each 1   Continuous Blood Gluc Sensor (DEXCOM G6 SENSOR) MISC USE TO CHECK BLOOD SUGAR UP  TO 4 TIMES DAILY AS DIRECTED. CHANGE SENSOR EVERY 30 DAYS. DX: E11.9 3 each 4   Continuous Blood Gluc Transmit (DEXCOM G6 TRANSMITTER) MISC USE TO CHECK BLOOD SUGAR UP TO 4 TIMES DAILY AS DIRECTED. CHANGE TRANSMITTER EVERY 3 MONTHS. DX: E11.9 1 each 12   docusate sodium (COLACE) 100 MG capsule Take 100 mg by mouth daily as needed for mild constipation or moderate constipation.      donepezil (ARICEPT) 5 MG tablet Take 1 tablet (5 mg total) by mouth at bedtime. 90 tablet 3   famotidine (PEPCID) 20 MG tablet Take 1 tablet (20 mg total) by mouth 2 (two) times daily with a meal. 180 tablet 3   furosemide (LASIX) 20 MG tablet TAKE 1 TABLET BY MOUTH  DAILY AS NEEDED 90 tablet 3   glucose blood (ONETOUCH ULTRA) test strip Check  BS TID Dx E11.59 400 each 3   insulin glargine (LANTUS SOLOSTAR) 100 UNIT/ML Solostar Pen INJECT SUBCUTANEOUSLY 56 UNITS AT BEDTIME 60 mL 3   insulin lispro (HUMALOG) 100 UNIT/ML injection Inject 15-20 Units into the skin 3 (three) times daily as needed for high blood sugar.     isosorbide mononitrate (IMDUR) 30 MG 24 hr tablet TAKE 1 TABLET BY MOUTH IN  THE EVENING 90 tablet 3   metFORMIN (GLUCOPHAGE-XR) 500 MG 24 hr tablet Take 2 tablets (1,000 mg total) by mouth 2 (two) times daily. 360 tablet 3   nitroGLYCERIN (NITROSTAT) 0.4 MG SL tablet DISSOLVE ONE TABLET UNDER THE TONGUE EVERY 5 MINUTES AS NEEDED FOR CHEST PAIN.  DO NOT EXCEED A TOTAL OF 3 DOSES IN 15 MINUTES 25 tablet 3   Omega-3 Fatty Acids (FISH OIL) 1200 MG CAPS Take 1,200-2,400 mg by mouth See admin instructions. Tale 1200 mg in the morning and 2400 mg in the evening     PARoxetine (PAXIL) 20 MG tablet Take 1 tablet (20 mg total) by mouth daily. 90 tablet 3   tamsulosin (FLOMAX) 0.4 MG CAPS capsule Take 1 capsule (0.4 mg total) by mouth daily. 90 capsule 3   Turmeric Curcumin 500 MG CAPS Take 500 mg by mouth daily.     vitamin B-12 (CYANOCOBALAMIN) 1000 MCG tablet Take 2,000 mcg by mouth daily.     No current  facility-administered medications for this visit.   Allergies:  Ace inhibitors, Angiotensin receptor blockers, Oxycodone-acetaminophen, Robaxin [methocarbamol], Toradol [ketorolac tromethamine], Valium, and Doxycycline   ROS: Hearing loss.  No palpitations or syncope.  Physical Exam: VS:  BP (!) 170/60   Pulse 66   Ht '5\' 6"'$  (1.676 m)   Wt 237 lb 9.6 oz (107.8 kg)   SpO2 98%   BMI 38.35 kg/m , BMI Body mass index is 38.35 kg/m.  Wt Readings from Last 3 Encounters:  08/07/20 237 lb 9.6 oz (107.8 kg)  07/18/20 230 lb (104.3 kg)  07/01/20 224 lb 3.3 oz (101.7 kg)    General: Elderly male, appears comfortable at rest. HEENT: Conjunctiva and lids normal, wearing a mask. Neck: Supple, no elevated JVP or carotid bruits, no thyromegaly. Lungs: Few rhonchi, nonlabored breathing at rest. Cardiac: Regular rate and rhythm, no S3, 2/6 systolic murmur, no pericardial rub. Abdomen: Soft, nontender, bowel sounds present. Extremities: No pitting edema, distal pulses 2+.  ECG:  An ECG dated 07/01/2020 was personally reviewed today and demonstrated:  Sinus rhythm with left atrial enlargement and nonspecific T wave changes.  Recent Labwork: 07/01/2020: B Natriuretic Peptide 51.0 07/02/2020: Magnesium 1.7 07/18/2020: ALT 17; AST 13; BUN 11; Creatinine, Ser 1.02; Hemoglobin 12.8; Platelets 299; Potassium 4.2; Sodium 139     Component Value Date/Time   CHOL 118 04/02/2020 0833   CHOL 139 06/02/2012 1007   TRIG 107 04/02/2020 0833   TRIG 185 (H) 04/10/2013 0937   TRIG 107 06/02/2012 1007   HDL 37 (L) 04/02/2020 0833   HDL 34 (L) 04/10/2013 0937   HDL 40 06/02/2012 1007   CHOLHDL 3.2 04/02/2020 0833   CHOLHDL 3.8 11/16/2019 0206   VLDL 25 11/16/2019 0206   LDLCALC 61 04/02/2020 0833   LDLCALC 62 04/10/2013 0937   LDLCALC 78 06/02/2012 1007    Other Studies Reviewed Today:  Echocardiogram 01/10/2020:  1. Left ventricular ejection fraction, by estimation, is 60 to 65%. The  left ventricle has  normal function. The left ventricle has no regional  wall motion abnormalities. The left  ventricular internal cavity size was  mildly dilated. Left ventricular  diastolic parameters are consistent with Grade I diastolic dysfunction  (impaired relaxation).   2. Right ventricular systolic function is normal. The right ventricular  size is normal.   3. The mitral valve is degenerative. Trivial mitral valve regurgitation.  No evidence of mitral stenosis.   4. The aortic valve has been repaired/replaced. Aortic valve  regurgitation is not visualized. No aortic stenosis is present. There is a  26 mm Sapien prosthetic (TAVR) valve present in the aortic position.  Procedure Date: 12/04/19. Echo findings are  consistent with normal structure and function of the aortic valve  prosthesis. Aortic valve area, by VTI measures 1.67 cm. Aortic valve mean  gradient measures 12.0 mmHg. Aortic valve Vmax measures 2.37 m/s. DI 0.34.   5. Aortic dilatation noted. There is mild dilatation of the aortic root,  measuring 43 mm. There is mild dilatation of the ascending aorta,  measuring 43 mm.   6. The inferior vena cava is normal in size with greater than 50%  respiratory variability, suggesting right atrial pressure of 3 mmHg.   Assessment and Plan:  1.  Severe aortic stenosis status post TAVR in November 2021.  Last echocardiogram was in December 2021 at which point valve function was stable with mean gradient 12 mmHg and no paravalvular regurgitation.  Plan to repeat imaging in 6 months.  2.  Mildly dilated aortic root and ascending aorta at 43 mm.  Asymptomatic.  3.  CAD status post BMS to nondominant RCA in 2006 as well as DES x2 to the mid LAD in November 2021.  He does not report any active angina.  Continue aspirin and Lipitor.  4.  Essential hypertension, on Norvasc 10 mg daily.  Has allergy to ACE inhibitor and ARB.  Add chlorthalidone 25 mg daily and follow-up BMET in 2 weeks.  Medication  Adjustments/Labs and Tests Ordered: Current medicines are reviewed at length with the patient today.  Concerns regarding medicines are outlined above.   Tests Ordered: Orders Placed This Encounter  Procedures   Basic metabolic panel   ECHOCARDIOGRAM COMPLETE     Medication Changes: Meds ordered this encounter  Medications   chlorthalidone (HYGROTON) 25 MG tablet    Sig: Take 1 tablet (25 mg total) by mouth daily.    Dispense:  90 tablet    Refill:  3    08/07/2020 NEW     Disposition:  Follow up  6 months.  Signed, Satira Sark, MD, [facility] 08/07/2020 8:40 AM    [facility], [place], [place] [address] Phone: [phone]; Fax: [phone]

## 2020-08-06 NOTE — Chronic Care Management (AMB) (Signed)
  Chronic Care Management   Note  08/06/2020 Name: [name] MRN: [id number] DOB: July 04, 1940  [name] is a 80 y.o. year old male who is a primary care patient of Dettinger, Fransisca Kaufmann, MD. I reached out to [name] by phone today in response to a referral sent by [name]'s PCP, Dettinger, Fransisca Kaufmann, MD      [name] was given information about Chronic Care Management services today including:  CCM service includes personalized support from designated clinical staff supervised by his physician, including individualized plan of care and coordination with other care providers 24/7 contact phone numbers for assistance for urgent and routine care needs. Service will only be billed when office clinical staff spend 20 minutes or more in a month to coordinate care. Only one practitioner may furnish and bill the service in a calendar month. The patient may stop CCM services at any time (effective at the end of the month) by phone call to the office staff. The patient will be responsible for cost sharing (co-pay) of up to 20% of the service fee (after annual deductible is met).  Patient agreed to services and verbal consent obtained.   Follow up plan: Telephone appointment with care management team member scheduled for:08/20/2020  Noreene Larsson, [place], [place], [place] [address] Direct Dial: [phone] [email or web address]: [email or web address]

## 2020-08-06 NOTE — Telephone Encounter (Signed)
Patient needs new Rx's for his insulin.  Almyra Free helps him through the [employer].  Please call and let them know what to do.

## 2020-08-07 ENCOUNTER — Encounter: Payer: Self-pay | Admitting: Cardiology

## 2020-08-07 ENCOUNTER — Other Ambulatory Visit: Payer: Self-pay

## 2020-08-07 ENCOUNTER — Ambulatory Visit: Payer: Medicare Other | Admitting: Cardiology

## 2020-08-07 VITALS — BP 170/60 | HR 66 | Ht 66.0 in | Wt 237.6 lb

## 2020-08-07 DIAGNOSIS — Z952 Presence of prosthetic heart valve: Secondary | ICD-10-CM | POA: Diagnosis not present

## 2020-08-07 DIAGNOSIS — I25119 Atherosclerotic heart disease of native coronary artery with unspecified angina pectoris: Secondary | ICD-10-CM

## 2020-08-07 DIAGNOSIS — I10 Essential (primary) hypertension: Secondary | ICD-10-CM | POA: Diagnosis not present

## 2020-08-07 DIAGNOSIS — Z79899 Other long term (current) drug therapy: Secondary | ICD-10-CM

## 2020-08-07 MED ORDER — CHLORTHALIDONE 25 MG PO TABS: 25.0000 mg | ORAL_TABLET | Freq: Every day | ORAL | 3 refills | Status: DC

## 2020-08-07 NOTE — Patient Instructions (Addendum)
Medication Instructions:  Start chlorthalidone 25 mg by mouth daily Continue other medications the same  Labwork: BMET in 2 weeks  Testing/Procedures: Echocardiogram in 6 months just before your next visit  Follow-Up: Your physician recommends that you schedule a follow-up appointment in: 6 months  Any Other Special Instructions Will Be Listed Below (If Applicable).  If you need a refill on your cardiac medications before your next appointment, please call your pharmacy.q

## 2020-08-11 ENCOUNTER — Other Ambulatory Visit: Payer: Self-pay | Admitting: Family Medicine

## 2020-08-11 NOTE — Telephone Encounter (Signed)
Pts wife called stating that [facility] is still waiting to receive new Rx for pts Dexcom before they can send new one to pt.  Can fax to [phone]

## 2020-08-11 NOTE — Telephone Encounter (Signed)
It was sent and June 17 to [facility] pharmacies by Almyra Free, as this the pharmacy that works with [facility] or is different?

## 2020-08-12 ENCOUNTER — Other Ambulatory Visit: Payer: Self-pay

## 2020-08-12 MED ORDER — DEXCOM G6 TRANSMITTER MISC: 12 refills | Status: DC

## 2020-08-12 MED ORDER — DEXCOM G6 SENSOR MISC: 4 refills | Status: DC

## 2020-08-12 NOTE — Telephone Encounter (Signed)
Wife is not sure about [facility] pharmacy. They have not called pt and she has not received a shipment.  Pt is due to change his Dexcom on the 7th. His insulin is getting low as well.  Printed Rx for [employer] and sensor to fax to [facility] per wife.  Dr. Warrick Parisian,  Wife states that his insulin was changed to [facility]. Just need verification because it is not on current med list. Humalog -insulin lispro is.  Need to sign scripts and fax insulin asap.

## 2020-08-13 ENCOUNTER — Other Ambulatory Visit: Payer: Self-pay

## 2020-08-13 ENCOUNTER — Other Ambulatory Visit: Payer: Medicare Other

## 2020-08-13 ENCOUNTER — Telehealth: Payer: Self-pay

## 2020-08-13 DIAGNOSIS — Z79899 Other long term (current) drug therapy: Secondary | ICD-10-CM | POA: Diagnosis not present

## 2020-08-13 DIAGNOSIS — I25119 Atherosclerotic heart disease of native coronary artery with unspecified angina pectoris: Secondary | ICD-10-CM | POA: Diagnosis not present

## 2020-08-13 DIAGNOSIS — Z952 Presence of prosthetic heart valve: Secondary | ICD-10-CM | POA: Diagnosis not present

## 2020-08-13 MED ORDER — BASAGLAR KWIKPEN 100 UNIT/ML ~~LOC~~ SOPN: 56.0000 [IU] | PEN_INJECTOR | Freq: Every day | SUBCUTANEOUS | 3 refills | Status: DC

## 2020-08-13 MED ORDER — INSULIN LISPRO 100 UNIT/ML ~~LOC~~ SOLN: 15.0000 [IU] | Freq: Three times a day (TID) | SUBCUTANEOUS | 3 refills | Status: DC | PRN

## 2020-08-13 NOTE — Telephone Encounter (Signed)
Spoke with wife on 08/12/20. States that pt is almost out of his insulin and he uses the [facility] now.  She would like for the refills to go to [facility].  Also, needs Dexcom to go to [facility] per wife. Rx's printed on 8/2. Dettinger signed this morning and faxed to [facility]. Also, confirmed with Dettinger basaglar dosage and then sent to [facility].

## 2020-08-13 NOTE — Telephone Encounter (Signed)
Here are the insulin prescriptions, if you can find the corrrect pharmacy

## 2020-08-14 ENCOUNTER — Ambulatory Visit: Payer: Medicare Other | Admitting: Adult Health

## 2020-08-14 ENCOUNTER — Encounter: Payer: Self-pay | Admitting: Adult Health

## 2020-08-14 VITALS — BP 166/69 | HR 67 | Ht 66.0 in | Wt 236.0 lb

## 2020-08-14 DIAGNOSIS — Z9989 Dependence on other enabling machines and devices: Secondary | ICD-10-CM

## 2020-08-14 DIAGNOSIS — G4733 Obstructive sleep apnea (adult) (pediatric): Secondary | ICD-10-CM

## 2020-08-14 LAB — BASIC METABOLIC PANEL
BUN/Creatinine Ratio: 14 (ref 10–24)
BUN: 14 mg/dL (ref 8–27)
CO2: 23 mmol/L (ref 20–29)
Calcium: 9.1 mg/dL (ref 8.6–10.2)
Chloride: 101 mmol/L (ref 96–106)
Creatinine, Ser: 1.01 mg/dL (ref 0.76–1.27)
Glucose: 164 mg/dL — ABNORMAL HIGH (ref 65–99)
Potassium: 4.3 mmol/L (ref 3.5–5.2)
Sodium: 139 mmol/L (ref 134–144)
eGFR: 75 mL/min/{1.73_m2} (ref >59)

## 2020-08-14 MED ORDER — INSULIN LISPRO 100 UNIT/ML ~~LOC~~ SOLN: 15.0000 [IU] | Freq: Three times a day (TID) | SUBCUTANEOUS | 3 refills | Status: DC | PRN

## 2020-08-14 NOTE — Addendum Note (Signed)
Addended by: Lottie Dawson D on: 08/14/2020 11:03 AM   Modules accepted: Orders

## 2020-08-14 NOTE — Progress Notes (Addendum)
PATIENT: [name] DOB: 1940/02/04  REASON FOR VISIT: follow up HISTORY FROM: patient Primary neurologist: Dr. Rexene Alberts  HISTORY OF PRESENT ILLNESS: Today 08/14/20:  [name] is an 80 year old male with a history of obstructive sleep apnea on CPAP.  He returns today for follow-up.  He reports that the CPAP is working well.  He denies any new issues.  Returns today for an evaluation.    08/15/19: [name] is a 80 year old male with a history of obstructive sleep apnea on CPAP.  His download indicates that he uses machine nightly for compliance of 100%.  He uses machine greater than 4 hours each night.  On average he uses his machine 8 hours and 38 minutes.  His residual AHI is 3.4 on 9 cm of water with EPR 3.  Reports that the CPAP is working well for him.  He returns today for an evaluation.  HISTORY 08/15/18:   [name] is a 80 year old male with a history of obstructive sleep apnea on CPAP.  His CPAP download indicates that he uses machine nightly for compliance of 100%.  He uses machine greater than 4 hours 29 out of 30 days for compliance of 97%.  On average he uses his machine 7 hours and 23 minutes.  His residual AHI is 2 on 8 cm of water with EPR of 3.  His leak in the 95th percentile is 10.9.  He reports that some nights he does not feel that he is getting enough air.  He is wondering if we can increase the pressure.  He returns today for follow-up.  REVIEW OF SYSTEMS: Out of a complete 14 system review of symptoms, the patient complains only of the following symptoms, and all other reviewed systems are negative.   ESS 14  ALLERGIES: Allergies  Allergen Reactions   Ace Inhibitors Hives, Swelling and Rash    Rash,hives,tongue swelling   Angiotensin Receptor Blockers     Unknown reaction    Oxycodone-Acetaminophen Other (See Comments)    Unknown reaction   Robaxin [Methocarbamol] Other (See Comments)    Confusion    Toradol [Ketorolac Tromethamine] Other  (See Comments)    confusion   Valium Other (See Comments)    Hallucinations; confusion   Doxycycline Hives and Rash    HOME MEDICATIONS: Outpatient Medications Prior to Visit  Medication Sig Dispense Refill   acetaminophen (TYLENOL) 650 MG CR tablet Take 1,300 mg by mouth 2 (two) times daily.     amLODipine (NORVASC) 10 MG tablet TAKE 1 TABLET BY MOUTH  DAILY 90 tablet 3   aspirin EC 81 MG tablet Take 81 mg by mouth at bedtime.      atorvastatin (LIPITOR) 40 MG tablet TAKE 1 TABLET BY MOUTH IN  THE EVENING (Patient taking differently: Take 40 mg by mouth daily.) 90 tablet 3   butalbital-acetaminophen-caffeine (FIORICET) 50-325-40 MG tablet Take 1 tablet by mouth 2 (two) times daily as needed for headache.     cetirizine (ZYRTEC) 10 MG tablet Take 10 mg by mouth daily with supper.      chlorthalidone (HYGROTON) 25 MG tablet Take 1 tablet (25 mg total) by mouth daily. 90 tablet 3   Cholecalciferol (VITAMIN D3) 50 MCG (2000 UT) capsule Take 4,000 Units by mouth daily.      Continuous Blood Gluc Receiver (DEXCOM G6 RECEIVER) DEVI USE TO CHECK BLOOD SUGAR UP TO 4 TIMES DAILY AS DIRECTED. 1 DEVICE PER YEAR. DX: E11.9 1 each 1   Continuous Blood  Gluc Sensor (DEXCOM G6 SENSOR) MISC USE TO CHECK BLOOD SUGAR UP TO 4 TIMES DAILY AS DIRECTED. CHANGE SENSOR EVERY 30 DAYS. DX: E11.9 3 each 4   Continuous Blood Gluc Transmit (DEXCOM G6 TRANSMITTER) MISC USE TO CHECK BLOOD SUGAR UP TO 4 TIMES DAILY AS DIRECTED. CHANGE TRANSMITTER EVERY 3 MONTHS. DX: E11.9 1 each 12   docusate sodium (COLACE) 100 MG capsule Take 100 mg by mouth daily as needed for mild constipation or moderate constipation.      donepezil (ARICEPT) 5 MG tablet Take 1 tablet (5 mg total) by mouth at bedtime. 90 tablet 3   famotidine (PEPCID) 20 MG tablet Take 1 tablet (20 mg total) by mouth 2 (two) times daily with a meal. 180 tablet 3   furosemide (LASIX) 20 MG tablet TAKE 1 TABLET BY MOUTH  DAILY AS NEEDED 90 tablet 3   glucose blood  (ONETOUCH ULTRA) test strip Check BS TID Dx E11.59 400 each 3   Insulin Glargine (BASAGLAR KWIKPEN) 100 UNIT/ML Inject 56 Units into the skin daily. 50.4 mL 3   Insulin Glargine (BASAGLAR KWIKPEN) 100 UNIT/ML Inject 56 Units into the skin at bedtime. 15 mL 3   insulin lispro (HUMALOG) 100 UNIT/ML injection Inject 0.15-0.2 mLs (15-20 Units total) into the skin 3 (three) times daily as needed for high blood sugar. 10 mL 3   isosorbide mononitrate (IMDUR) 30 MG 24 hr tablet TAKE 1 TABLET BY MOUTH IN  THE EVENING 90 tablet 3   metFORMIN (GLUCOPHAGE-XR) 500 MG 24 hr tablet Take 2 tablets (1,000 mg total) by mouth 2 (two) times daily. 360 tablet 3   nitroGLYCERIN (NITROSTAT) 0.4 MG SL tablet DISSOLVE ONE TABLET UNDER THE TONGUE EVERY 5 MINUTES AS NEEDED FOR CHEST PAIN.  DO NOT EXCEED A TOTAL OF 3 DOSES IN 15 MINUTES 25 tablet 3   Omega-3 Fatty Acids (FISH OIL) 1200 MG CAPS Take 1,200-2,400 mg by mouth See admin instructions. Tale 1200 mg in the morning and 2400 mg in the evening     PARoxetine (PAXIL) 20 MG tablet Take 1 tablet (20 mg total) by mouth daily. 90 tablet 3   tamsulosin (FLOMAX) 0.4 MG CAPS capsule Take 1 capsule (0.4 mg total) by mouth daily. 90 capsule 3   Turmeric Curcumin 500 MG CAPS Take 500 mg by mouth daily.     vitamin B-12 (CYANOCOBALAMIN) 1000 MCG tablet Take 2,000 mcg by mouth daily.     No facility-administered medications prior to visit.    PAST MEDICAL HISTORY: Past Medical History:  Diagnosis Date   Anxiety    Aortic atherosclerosis ([place]) 12/19/2017   Aortic stenosis    26 mm Edwards S3U THV November 2021   Arthritis    Bursitis of hip    Cervical spondylosis    Chronic headaches    Coronary atherosclerosis of native coronary artery    BMS nondominant RCA 12/2004, DES x2 to mid LAD 11/2019   Essential hypertension    GERD (gastroesophageal reflux disease)    History of adenomatous polyp of colon    History of kidney stones    History of MI (myocardial infarction)  12/2004   History of viral meningitis 02/28/2018   December 2019   Hyperlipidemia    Internal hemorrhoids    Low back pain    Nocturia more than twice per night    OSA (obstructive sleep apnea)    Spinal stenosis    Type 2 diabetes mellitus treated with insulin ([place])  UTI (urinary tract infection)    Wears dentures    Upper    PAST SURGICAL HISTORY: Past Surgical History:  Procedure Laterality Date   CARDIAC CATHETERIZATION  06-14-2004  dr Lia Foyer   total occlusion mD1, RI 30-40%, mRCA nondominant hazy 80% and scattered 30-40%,  ef 71% (medical mangement)   CARDIOVASCULAR STRESS TEST  09-17-2015   dr Domenic Polite   Low risk nuclear study w/ reversible mild small anteroapical defect,  normal LV function and wall motion, nuclear stress ef 61%   CATARACT EXTRACTION W/ INTRAOCULAR LENS IMPLANT Right 07/2014   COLONOSCOPY  09/10/2008   normal   CORONARY ANGIOPLASTY WITH STENT PLACEMENT  12-17-2004   dr benismhon/ dr Olevia Perches   nonobstructive cad LAD and LCFx,  BMS x1 to total occlusion RCA    CORONARY ATHERECTOMY  11/22/2019   CORONARY ATHERECTOMY N/A 11/22/2019   Procedure: CORONARY ATHERECTOMY;  Surgeon: Burnell Blanks, MD;  Location: [facility];  Service: Cardiovascular;  Laterality: N/A;   CYSTOSCOPY W/ URETEROSCOPY W/ LITHOTRIPSY  08/2010   ERCP  03/14/2008   EYE SURGERY Bilateral    cataracts   LAPAROSCOPIC CHOLECYSTECTOMY  05/2012   LEFT HEART CATHETERIZATION WITH CORONARY ANGIOGRAM N/A 05/14/2011   Procedure: LEFT HEART CATHETERIZATION WITH CORONARY ANGIOGRAM;  Surgeon: Sherren Mocha, MD;  Location: [facility] CATH LAB;  Service: Cardiovascular;  Laterality: N/A;    non-obstructive LM, LAD, LCFx, patent RCA stent   LUMBAR LAMINECTOMY/DECOMPRESSION MICRODISCECTOMY N/A 02/24/2017   Procedure: Microlumbar decompression L4-5, L5-S1;  Surgeon: Susa Day, MD;  Location: [facility];  Service: Orthopedics;  Laterality: N/A;  120 mins   RIGHT/LEFT HEART CATH AND CORONARY  ANGIOGRAPHY N/A 11/14/2019   Procedure: RIGHT/LEFT HEART CATH AND CORONARY ANGIOGRAPHY;  Surgeon: Burnell Blanks, MD;  Location: [facility];  Service: Cardiovascular;  Laterality: N/A;   ROTATOR CUFF REPAIR Right 03/2002   ROTATOR CUFF REPAIR Left 12/11/2015   TEE WITHOUT CARDIOVERSION N/A 12/04/2019   Procedure: TRANSESOPHAGEAL ECHOCARDIOGRAM (TEE);  Surgeon: Burnell Blanks, MD;  Location: [facility];  Service: Open Heart Surgery;  Laterality: N/A;   TOTAL HIP ARTHROPLASTY Right 12/20/2008   TOTAL HIP ARTHROPLASTY Left 07/05/2018   Procedure: TOTAL HIP ARTHROPLASTY ANTERIOR APPROACH;  Surgeon: Gaynelle Arabian, MD;  Location: [facility];  Service: Orthopedics;  Laterality: Left;  168mn   TOTAL KNEE ARTHROPLASTY Bilateral left 12-11-2003/  [facility]2004   [facility]  TRANSCATHETER AORTIC VALVE REPLACEMENT, TRANSFEMORAL N/A 12/04/2019   Procedure: TRANSCATHETER AORTIC VALVE REPLACEMENT, TRANSFEMORAL;  Surgeon: MBurnell Blanks MD;  [facility];  Service: Open Heart Surgery;  Laterality: N/A;   UPPER GASTROINTESTINAL ENDOSCOPY  01/08/2008   bx, inlet patch, duodenitis   YAG LASER APPLICATION Right [id number]  Procedure: YAG LASER APPLICATION;  Surgeon: CWilliams Che MD;  Location: [facility];  Service: Ophthalmology;  Laterality: Right;    FAMILY HISTORY: Family History  Problem Relation Age of Onset   Colon cancer Mother        Diagnosed age 80  Cancer Mother 642  Heart disease Father    Heart attack Father    Breast cancer Sister    Heart disease Brother    Brain cancer Sister    Heart disease Brother    Bladder Cancer Brother    Cancer - Other Brother     SOCIAL HISTORY: Social History   Socioeconomic History   Marital status: Married    Spouse name: Dione  Number of children: 1   Years of education: HS   Highest education level: High school graduate  Occupational History   Occupation: RETIRED    Employer:  [employer]    Comment: Power plant  Tobacco Use   Smoking status: Former    Packs/day: 1.00    Years: 20.00    Pack years: 20.00    Types: Cigarettes    Start date: 01/18/1963    Quit date: 08/11/1988    Years since quitting: 32.0   Smokeless tobacco: Former    Types: Chew    Quit date: 01/11/1989   Tobacco comments:    chewed 1 pack tobacco/day for 15 years  Vaping Use   Vaping Use: Never used  Substance and Sexual Activity   Alcohol use: No    Alcohol/week: 0.0 standard drinks   Drug use: No   Sexual activity: Yes  Other Topics Concern   Not on file  Social History Narrative   No regular exercise      Daily caffeine use: 3 cups   Patient is right handed.    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  Generalized: Well developed, in no acute distress  Chest: Lungs clear to auscultation bilaterally  Neurological examination  Mentation: Alert oriented to time, place, history taking. Follows all commands speech and language fluent Cranial nerve II-XII: Extraocular movements were full, visual field were full on confrontational test Head turning and shoulder shrug  were normal and symmetric. Motor: The motor testing reveals 5 over 5 strength of all 4 extremities. Good symmetric motor tone is noted throughout.  Sensory: Sensory testing is intact to soft touch on all 4 extremities. No evidence of extinction is noted.  Gait and station: Gait is normal.    DIAGNOSTIC DATA (LABS,  IMAGING, TESTING) - I reviewed patient records, labs, notes, testing and imaging myself where available.  Lab Results  Component Value Date   WBC 6.7 07/18/2020   HGB 12.8 (L) 07/18/2020   HCT 39.7 07/18/2020   MCV 87 07/18/2020   PLT 299 07/18/2020      Component Value Date/Time   NA 139 08/13/2020 0947   K 4.3 08/13/2020 0947   CL 101 08/13/2020 0947   CO2 23 08/13/2020 0947   GLUCOSE 164 (H) 08/13/2020 0947   GLUCOSE 192 (H) 07/07/2020 0645   BUN 14 08/13/2020 0947   CREATININE 1.01 08/13/2020 0947   CREATININE 0.93 06/02/2012 1007   CALCIUM 9.1 08/13/2020 0947   PROT 6.8 07/18/2020 0959   ALBUMIN 3.9 07/18/2020 0959   AST 13 07/18/2020 0959   ALT 17 07/18/2020 0959   ALKPHOS 77 07/18/2020 0959   BILITOT 0.5 07/18/2020 0959   GFRNONAA >60 07/07/2020 0645   GFRNONAA 82 06/02/2012 1007   GFRAA 85 12/28/2019 0951   GFRAA >89 06/02/2012 1007   Lab Results  Component Value Date  CHOL 118 04/02/2020   HDL 37 (L) 04/02/2020   LDLCALC 61 04/02/2020   TRIG 107 04/02/2020   CHOLHDL 3.2 04/02/2020   Lab Results  Component Value Date   HGBA1C 8.4 (H) 07/02/2020   Lab Results  Component Value Date   VITAMINB12 1,002 02/28/2018   Lab Results  Component Value Date   TSH 0.930 12/15/2017      ASSESSMENT AND PLAN 80 y.o. year old male  has a past medical history of Anxiety, Aortic atherosclerosis ([place]) (12/19/2017), Aortic stenosis, Arthritis, Bursitis of hip, Cervical spondylosis, Chronic headaches, Coronary atherosclerosis of native coronary artery, Essential hypertension, GERD (gastroesophageal reflux disease), History of adenomatous polyp of colon, History of kidney stones, History of MI (myocardial infarction) (12/2004), History of viral meningitis (02/28/2018), Hyperlipidemia, Internal hemorrhoids, Low back pain, Nocturia more than twice per night, OSA (obstructive sleep apnea), Spinal stenosis, Type 2 diabetes mellitus treated with insulin ([place]), UTI (urinary tract  infection), and Wears dentures. here with:  OSA on CPAP  - CPAP compliance excellent - Good treatment of AHI  - Encourage patient to use CPAP nightly and > 4 hours each night - F/U in 1 year or sooner if needed   I spent 20 minutes of face-to-face and non-face-to-face time with patient.  This included previsit chart review, lab review, study review, order entry, electronic health record documentation, patient education.  Ward Givens, MSN, NP-C 08/14/2020, 9:29 AM [facility] [address] [place], [place] [address] [phone]  I reviewed the above note and documentation by the Nurse Practitioner and agree with the history, exam, assessment and plan as outlined above. I was available for consultation. Star Age, MD, PhD [facility])

## 2020-08-14 NOTE — Patient Instructions (Signed)
Continue using CPAP nightly and greater than 4 hours each night °If your symptoms worsen or you develop new symptoms please let us know.  ° °

## 2020-08-15 ENCOUNTER — Other Ambulatory Visit: Payer: Self-pay

## 2020-08-15 DIAGNOSIS — E119 Type 2 diabetes mellitus without complications: Secondary | ICD-10-CM | POA: Diagnosis not present

## 2020-08-15 DIAGNOSIS — Z794 Long term (current) use of insulin: Secondary | ICD-10-CM | POA: Diagnosis not present

## 2020-08-15 MED ORDER — INSULIN LISPRO (1 UNIT DIAL) 100 UNIT/ML (KWIKPEN): 20.0000 [IU] | PEN_INJECTOR | Freq: Three times a day (TID) | SUBCUTANEOUS | 11 refills | Status: DC

## 2020-08-15 NOTE — Addendum Note (Signed)
Addended by: Lottie Dawson D on: 08/15/2020 08:47 AM   Modules accepted: Orders

## 2020-08-15 NOTE — Progress Notes (Signed)
CORRECTED HUMALOG FOR KWIK PEN

## 2020-08-18 ENCOUNTER — Telehealth: Payer: Self-pay | Admitting: *Deleted

## 2020-08-18 NOTE — Telephone Encounter (Signed)
Patient's wife Dione informed. Copy sent to PCP

## 2020-08-18 NOTE — Telephone Encounter (Signed)
-----   Message from Satira Sark, MD sent at 08/14/2020  8:27 AM EDT ----- Results reviewed.  Renal function and potassium are normal after recent start of chlorthalidone.  Continue with same plan.

## 2020-08-20 ENCOUNTER — Ambulatory Visit (INDEPENDENT_AMBULATORY_CARE_PROVIDER_SITE_OTHER): Payer: Medicare Other | Admitting: Pharmacist

## 2020-08-20 DIAGNOSIS — E119 Type 2 diabetes mellitus without complications: Secondary | ICD-10-CM | POA: Diagnosis not present

## 2020-08-20 NOTE — Progress Notes (Signed)
Chronic Care Management Pharmacy Note  08/20/2020 Name:  [name] MRN:  [id number] DOB:  [birth year]  Summary: DIABETES  Recommendations/Changes made from today's visit: Diabetes: Uncontrolled (a1c 8.4% up from previous 7.1% in march); current treatment: BASAGLAR 54 UNITS, HUMALOG KWIKPEN 20 UNITS TID MEALS, METFORMIN 2G PER DAY;  GFR 65, A1C 8.4% CONTINUE TO WORK ON DIET CONSIDERING GLP1/SGLT2 FOR ADDITIONAL GLYCEMIC CONTROL/BENEFITS Current glucose readings: fasting glucose: <200, post prandial glucose: 180-200s Denies hypoglycemic/hyperglycemic symptoms Discussed meal planning options and Plate method for healthy eating Avoid sugary drinks and desserts Incorporate balanced protein, non starchy veggies, 1 serving of carbohydrate with each meal Increase water intake Increase physical activity as able Current exercise: N/A Educated on DEXCOM, REFILLS CALLED IN Assessed patient finances. CALLED IN REFILLS TO [facility] FOR BASAGLAR & HUMALOG--PATIENT WILL NEED TO RE-ENROLL THIS FALL FOR 202  Plan: 2 MONTHS  Subjective: [name] is an 80 y.o. year old male who is a primary patient of Dettinger, Fransisca Kaufmann, MD.  The CCM team was consulted for assistance with disease management and care coordination needs.    Engaged with patient by telephone for initial visit in response to provider referral for pharmacy case management and/or care coordination services.   Consent to Services:  The patient was given the following information about Chronic Care Management services today, agreed to services, and gave verbal consent: 1. CCM service includes personalized support from designated clinical staff supervised by the primary care provider, including individualized plan of care and coordination with other care providers 2. 24/7 contact phone numbers for assistance for urgent and routine care needs. 3. Service will only be billed when office clinical  staff spend 20 minutes or more in a month to coordinate care. 4. Only one practitioner may furnish and bill the service in a calendar month. 5.The patient may stop CCM services at any time (effective at the end of the month) by phone call to the office staff. 6. The patient will be responsible for cost sharing (co-pay) of up to 20% of the service fee (after annual deductible is met). Patient agreed to services and consent obtained.  Patient Care Team: Dettinger, Fransisca Kaufmann, MD as PCP - General (Family Medicine) Satira Sark, MD as PCP - Cardiology (Cardiology) Burnell Blanks, MD as PCP - Structural Heart (Cardiology) Satira Sark, MD as Consulting Physician (Cardiology) Sydnee Cabal, MD as Consulting Physician (Orthopedic Surgery) Gaynelle Arabian, MD as Consulting Physician (Orthopedic Surgery) Suella Broad, MD as Consulting Physician (Physical Medicine and Rehabilitation) Lavera Guise, [facility] (Pharmacist) Lavonna Monarch, MD as Consulting Physician (Dermatology)   Objective:  Lab Results  Component Value Date   CREATININE 1.01 08/13/2020   CREATININE 1.02 07/18/2020   CREATININE 0.82 07/07/2020    Lab Results  Component Value Date   HGBA1C 8.4 (H) 07/02/2020   Last diabetic Eye exam:  Lab Results  Component Value Date/Time   HMDIABEYEEXA No Retinopathy 03/26/2019 12:00 AM    Last diabetic Foot exam: No results found for: HMDIABFOOTEX      Component Value Date/Time   CHOL 118 04/02/2020 0833   CHOL 139 06/02/2012 1007   TRIG 107 04/02/2020 0833   TRIG 185 (H) 04/10/2013 0937   TRIG 107 06/02/2012 1007   HDL 37 (L) 04/02/2020 0833   HDL 34 (L) 04/10/2013 0937   HDL 40 06/02/2012 1007   CHOLHDL 3.2 04/02/2020 0833   CHOLHDL 3.8 11/16/2019 0206   VLDL 25 11/16/2019  0206   LDLCALC 61 04/02/2020 0833   LDLCALC 62 04/10/2013 0937   LDLCALC 78 06/02/2012 1007    Hepatic Function Latest Ref Rng & Units 07/18/2020 07/02/2020 07/02/2020  Total Protein 6.0  - 8.5 g/dL 6.8 7.0 7.3  Albumin 3.7 - 4.7 g/dL 3.9 3.4(L) 3.6  AST 0 - 40 IU/L _0 ALT 0 - 44 IU/L _1 Alk Phosphatase 44 - 121 IU/L 77 74 74  Total Bilirubin 0.0 - 1.2 mg/dL 0.5 0.7 1.0  Bilirubin, Direct 0.0 - 0.3 mg/dL - - -    Lab Results  Component Value Date/Time   TSH 0.930 12/15/2017 05:52 AM   TSH 1.530 04/28/2017 09:01 AM   TSH 1.490 04/10/2013 09:37 AM    CBC Latest Ref Rng & Units 07/18/2020 07/07/2020 07/02/2020  WBC 3.4 - 10.8 x10E3/uL 6.7 7.7 11.3(H)  Hemoglobin 13.0 - 17.7 g/dL 12.8(L) 12.2(L) 13.6  Hematocrit 37.5 - 51.0 % 39.7 38.2(L) 43.2  Platelets 150 - 450 x10E3/uL 299 276 274    Lab Results  Component Value Date/Time   VD25OH 44.6 10/26/2016 09:01 AM    Clinical ASCVD: No  The ASCVD Risk score Mikey Bussing DC Jr., et al., 2013) failed to calculate for the following reasons:   The 2013 ASCVD risk score is only valid for ages 72 to 9    Other: (CHADS2VASc if Afib, PHQ9 if depression, MMRC or CAT for COPD, ACT, DEXA)  Social History   Tobacco Use  Smoking Status Former   Packs/day: 1.00   Years: 20.00   Pack years: 20.00   Types: Cigarettes   Start date: 01/18/1963   Quit date: 08/11/1988   Years since quitting: 32.0  Smokeless Tobacco Former   Types: Chew   Quit date: 01/11/1989  Tobacco Comments   chewed 1 pack tobacco/day for 15 years   BP Readings from Last 3 Encounters:  08/14/20 (!) 166/69  08/07/20 (!) 170/60  07/18/20 (!) 144/61   Pulse Readings from Last 3 Encounters:  08/14/20 67  08/07/20 66  07/18/20 72   Wt Readings from Last 3 Encounters:  08/14/20 236 lb (107 kg)  08/07/20 237 lb 9.6 oz (107.8 kg)  07/18/20 230 lb (104.3 kg)    Assessment: Review of patient past medical history, allergies, medications, health status, including review of consultants reports, laboratory and other test data, was performed as part of comprehensive evaluation and provision of chronic care management services.   SDOH:  (Social Determinants  of Health) assessments and interventions performed:    CCM Care Plan  Allergies  Allergen Reactions   Ace Inhibitors Hives, Swelling and Rash    Rash,hives,tongue swelling   Angiotensin Receptor Blockers     Unknown reaction    Oxycodone-Acetaminophen Other (See Comments)    Unknown reaction   Robaxin [Methocarbamol] Other (See Comments)    Confusion    Toradol [Ketorolac Tromethamine] Other (See Comments)    confusion   Valium Other (See Comments)    Hallucinations; confusion   Doxycycline Hives and Rash    Medications Reviewed Today     Reviewed by Lavera Guise, [facility] (Pharmacist) on 08/27/20 at [place] List Status: <None>   Medication Order Taking? Sig Documenting Provider Last Dose Status Informant  acetaminophen (TYLENOL) 650 MG CR tablet [id number] No Take 1,300 mg by mouth 2 (two) times daily. [provider] Taking Active Spouse/Significant Other  amLODipine (NORVASC) 10 MG tablet [id number] No TAKE 1 TABLET BY MOUTH  DAILY Eileen Stanford, PA-C Taking Active Spouse/Significant Other  aspirin EC 81 MG tablet [id number] No Take 81 mg by mouth at bedtime.  [provider] Taking Active Spouse/Significant Other  atorvastatin (LIPITOR) 40 MG tablet [id number] No TAKE 1 TABLET BY MOUTH IN  THE EVENING  Patient taking differently: Take 40 mg by mouth daily.   Eileen Stanford, PA-C Taking Active Spouse/Significant Other  butalbital-acetaminophen-caffeine (FIORICET) 50-325-40 MG tablet [id number] No Take 1 tablet by mouth 2 (two) times daily as needed for headache. [provider] Taking Active Spouse/Significant Other  cetirizine (ZYRTEC) 10 MG tablet [id number] No Take 10 mg by mouth daily with supper.  [provider] Taking Active Spouse/Significant Other  chlorthalidone (HYGROTON) 25 MG tablet [id number] No Take 1 tablet (25 mg total) by mouth daily. Satira Sark, MD Taking Active   Cholecalciferol (VITAMIN D3) 50 MCG (2000 UT)  capsule [id number] No Take 4,000 Units by mouth daily.  [provider] Taking Active Spouse/Significant Other  Continuous Blood Gluc Receiver (DEXCOM G6 RECEIVER) DEVI [id number] No USE TO CHECK BLOOD SUGAR UP TO 4 TIMES DAILY AS DIRECTED. 1 DEVICE PER YEAR. DX: E11.9 Dettinger, Fransisca Kaufmann, MD Taking Active Spouse/Significant Other  Continuous Blood Gluc Sensor (DEXCOM G6 SENSOR) MISC [id number] No USE TO CHECK BLOOD SUGAR UP TO 4 TIMES DAILY AS DIRECTED. CHANGE SENSOR EVERY 30 DAYS. DX: E11.9 Dettinger, Fransisca Kaufmann, MD Taking Active   Continuous Blood Gluc Transmit (DEXCOM G6 TRANSMITTER) MISC [id number] No USE TO CHECK BLOOD SUGAR UP TO 4 TIMES DAILY AS DIRECTED. CHANGE TRANSMITTER EVERY 3 MONTHS. DX: E11.9 Dettinger, Fransisca Kaufmann, MD Taking Active   docusate sodium (COLACE) 100 MG capsule [id number] No Take 100 mg by mouth daily as needed for mild constipation or moderate constipation.  [provider] Taking Active Spouse/Significant Other  donepezil (ARICEPT) 5 MG tablet [id number] No Take 1 tablet (5 mg total) by mouth at bedtime. Dettinger, Fransisca Kaufmann, MD Taking Active Spouse/Significant Other  famotidine (PEPCID) 20 MG tablet [id number] No Take 1 tablet (20 mg total) by mouth 2 (two) times daily with a meal. Dettinger, Fransisca Kaufmann, MD Taking Active Spouse/Significant Other  furosemide (LASIX) 20 MG tablet [id number] No TAKE 1 TABLET BY MOUTH  DAILY AS NEEDED Burnell Blanks, MD Taking Active Spouse/Significant Other  glucose blood (ONETOUCH ULTRA) test strip [id number] No Check BS TID Dx E11.59 Dettinger, Fransisca Kaufmann, MD Taking Active Spouse/Significant Other  Insulin Glargine (BASAGLAR KWIKPEN) 100 UNIT/ML [id number] No Inject 56 Units into the skin at bedtime. Dettinger, Fransisca Kaufmann, MD Taking Active   insulin lispro (HUMALOG KWIKPEN) 100 UNIT/ML KwikPen [id number]  Inject 20 Units into the skin 3 (three) times daily with meals. Dettinger, Fransisca Kaufmann, MD  Active   isosorbide mononitrate (IMDUR) 30 MG  24 hr tablet [id number] No TAKE 1 TABLET BY MOUTH IN  THE Ophelia Shoulder, MD Taking Active Spouse/Significant Other  metFORMIN (GLUCOPHAGE-XR) 500 MG 24 hr tablet [id number] No Take 2 tablets (1,000 mg total) by mouth 2 (two) times daily. Dettinger, Fransisca Kaufmann, MD Taking Active Spouse/Significant Other  nitroGLYCERIN (NITROSTAT) 0.4 MG SL tablet [id number] No DISSOLVE ONE TABLET UNDER THE TONGUE EVERY 5 MINUTES AS NEEDED FOR CHEST PAIN.  DO NOT EXCEED A TOTAL OF 3 DOSES IN 15 MINUTES Satira Sark, MD Taking Active Spouse/Significant Other           Med Note Juventino Slovak   Thu Dec 13, 2019  3:21 PM)    Omega-3  Fatty Acids (FISH OIL) 1200 MG CAPS [id number] No Take 1,200-2,400 mg by mouth See admin instructions. Tale 1200 mg in the morning and 2400 mg in the evening [provider] Taking Active Spouse/Significant Other  PARoxetine (PAXIL) 20 MG tablet [id number] No Take 1 tablet (20 mg total) by mouth daily. Dettinger, Fransisca Kaufmann, MD Taking Active Spouse/Significant Other  tamsulosin (FLOMAX) 0.4 MG CAPS capsule [id number] No Take 1 capsule (0.4 mg total) by mouth daily. Dettinger, Fransisca Kaufmann, MD Taking Active Spouse/Significant Other  Turmeric Curcumin 500 MG CAPS [id number] No Take 500 mg by mouth daily. [provider] Taking Active Spouse/Significant Other  vitamin B-12 (CYANOCOBALAMIN) 1000 MCG tablet [id number] No Take 2,000 mcg by mouth daily. [provider] Taking Active Spouse/Significant Other            Patient Active Problem List   Diagnosis Date Noted   Viral meningitis 07/05/2020   Hyperglycemia due to diabetes mellitus ([place]) 07/02/2020   Generalized weakness 07/02/2020   Thoracic ascending aortic aneurysm ([place]) 07/02/2020   Hyponatremia 07/02/2020   Obstructive sleep apnea 07/02/2020   Dementia ([place]) 07/02/2020   Dehydration 07/02/2020   Leukocytosis 07/02/2020   Encephalopathy 07/02/2020   Acute febrile illness 07/01/2020   Subacute  frontal sinusitis 06/20/2020   Severe aortic stenosis 12/04/2019   Unstable angina (HCC)    Nonrheumatic aortic valve stenosis    OAB (overactive bladder) 11/29/2018   Aftercare 09/26/2018   OA (osteoarthritis) of hip 07/05/2018   History of viral meningitis 02/28/2018   Aortic atherosclerosis ([place]) 12/19/2017   Word finding difficulty 12/14/2017   Pain of left hip joint 11/30/2017   History of total hip arthroplasty 10/12/2017   Trochanteric bursitis 10/12/2017   BPH (benign prostatic hyperplasia) 07/29/2017   Cervical spondylosis 07/21/2017   Lumbar spinal stenosis 02/25/2017   Spinal stenosis of lumbar region 02/24/2017   Spinal stenosis at L4-L5 level 02/24/2017   Lumbar radicular pain 01/17/2017   Lumbar pain 01/17/2017   Microalbuminuria due to type 2 diabetes mellitus ([place]) 07/23/2016   Common migraine 12/23/2014   Hx of adenomatous colonic polyps 05/09/2014   Obesity (BMI 30-39.9) 07/30/2013   Type 2 diabetes mellitus with circulatory disorder ([place]) 05/27/2011   Hyperlipidemia associated with type 2 diabetes mellitus ([place]) 10/19/2008   Hypertension associated with diabetes ([place]) 10/19/2008   CORONARY ATHEROSCLEROSIS NATIVE CORONARY ARTERY 10/17/2008   GERD (gastroesophageal reflux disease) 12/27/2007    Immunization History  Administered Date(s) Administered   Influenza, High Dose Seasonal PF 10/29/2015, 10/31/2017   Influenza, Quadrivalent, Recombinant, Inj, Pf 10/19/2018   Influenza,inj,Quad PF,6+ Mos 11/08/2013, 10/26/2016   Influenza,inj,quad, With Preservative 10/26/2016   Influenza-Unspecified 11/18/2014, 10/25/2019, 10/25/2019   PFIZER(Purple Top)SARS-COV-2 Vaccination 04/13/2019, 05/07/2019, 11/08/2019   Pneumococcal Conjugate-13 01/01/2013, 10/26/2016   Pneumococcal Polysaccharide-23 10/26/2016   Tdap 01/01/2013   Zoster Recombinat (Shingrix) 05/14/2016, 01/27/2017    Conditions to be addressed/monitored: DMII  Care Plan : PHARMD MEDICATION MANAGEMENT   Updates made by Lavera Guise, RPH since 08/27/2020 12:00 AM     Problem: DISEASE PROGRESSION PREVENTION      Long-Range Goal: T2DM   This Visit's Progress: Not on track  Priority: High  Note:   Current Barriers:  Unable to independently afford treatment regimen Unable to achieve control of T2DM   Pharmacist Clinical Goal(s):  Over the next 90 days, patient will verbalize ability to afford treatment regimen achieve control of T2DM as evidenced by IMPROVED GLYCEMIC CONTROL  through collaboration with PharmD and provider.  Interventions: 1:1 collaboration with Dettinger, Fransisca Kaufmann, MD regarding development and update of comprehensive plan of care as evidenced by provider attestation and co-signature Inter-disciplinary care team collaboration (see longitudinal plan of care) Comprehensive medication review performed; medication list updated in electronic medical record  Diabetes: Uncontrolled (a1c 8.4% up from previous 7.1% in march); current treatment: BASAGLAR 54 UNITS, HUMALOG KWIKPEN 20 UNITS TID MEALS, METFORMIN 2G PER DAY;  GFR 65, A1C 8.4% CONTINUE TO WORK ON DIET CONSIDERING GLP1/SGLT2 FOR ADDITIONAL GLYCEMIC CONTROL/BENEFITS Current glucose readings: fasting glucose: <200, post prandial glucose: 180-200s Denies hypoglycemic/hyperglycemic symptoms Discussed meal planning options and Plate method for healthy eating Avoid sugary drinks and desserts Incorporate balanced protein, non starchy veggies, 1 serving of carbohydrate with each meal Increase water intake Increase physical activity as able Current exercise: N/A Educated on DEXCOM, REFILLS CALLED IN Assessed patient finances. CALLED IN REFILLS TO [facility] FOR BASAGLAR & HUMALOG--PATIENT WILL NEED TO RE-ENROLL THIS FALL FOR 202  Patient Goals/Self-Care Activities Over the next 90 days, patient will:  - take medications as prescribed check glucose CONTINUOUSLY USING CGM, document,  and provide at future appointments collaborate with provider on medication access solutions  Follow Up Plan: Telephone follow up appointment with care management team member scheduled for: 2 months      Medication Assistance:  [place] ([place] obtained through [place] medication assistance program.  Enrollment ends 01/10/21  Patient's preferred pharmacy is:  [facility] [address], [place] [address] Phone: [phone] Fax: [phone]  [facility]  ([place]) - [place], [address] [place] [address] Phone: [phone] Fax: [phone], [place] (New Address) - [place], [place] - [place] AT Previously: [address], [place] [address] [place] [address] Phone: [phone] Fax: [phone]  [facility] [place], [place] - [address] [place] [address] Phone: [phone] Fax: [phone]  Uses pill box? No - WIFE OVERSEES MEDICATIONS Pt endorses 100% compliance  Follow Up:  Patient agrees to Care Plan and Follow-up.  Plan: Telephone follow up appointment with care management team member scheduled for:  2 months    Regina Eck, PharmD, BCPS Clinical Pharmacist, [place]  [facility] Phone [phone]

## 2020-08-27 NOTE — Patient Instructions (Signed)
Visit Information  PATIENT GOALS:  Goals Addressed               This Visit's Progress     Patient Stated     T2DM (pt-stated)        Current Barriers:  Unable to independently afford treatment regimen Unable to achieve control of T2DM   Pharmacist Clinical Goal(s):  Over the next 90 days, patient will verbalize ability to afford treatment regimen achieve control of T2DM as evidenced by IMPROVED GLYCEMIC CONTROL through collaboration with PharmD and provider.    Interventions: 1:1 collaboration with Dettinger, Fransisca Kaufmann, MD regarding development and update of comprehensive plan of care as evidenced by provider attestation and co-signature Inter-disciplinary care team collaboration (see longitudinal plan of care) Comprehensive medication review performed; medication list updated in electronic medical record  Diabetes: Uncontrolled (a1c 8.4% up from previous 7.1% in march); current treatment: BASAGLAR 54 UNITS, HUMALOG KWIKPEN 20 UNITS TID MEALS, METFORMIN 2G PER DAY;  GFR 65, A1C 8.4% CONTINUE TO WORK ON DIET CONSIDERING GLP1/SGLT2 FOR ADDITIONAL GLYCEMIC CONTROL/BENEFITS Current glucose readings: fasting glucose: <200, post prandial glucose: 180-200s Denies hypoglycemic/hyperglycemic symptoms Discussed meal planning options and Plate method for healthy eating Avoid sugary drinks and desserts Incorporate balanced protein, non starchy veggies, 1 serving of carbohydrate with each meal Increase water intake Increase physical activity as able Current exercise: N/A Educated on DEXCOM, REFILLS CALLED IN Assessed patient finances. CALLED IN REFILLS TO [facility] BASAGLAR & HUMALOG--PATIENT WILL NEED TO RE-ENROLL THIS FALL FOR 202  Patient Goals/Self-Care Activities Over the next 90 days, patient will:  - take medications as prescribed check glucose CONTINUOUSLY USING CGM, document, and provide at future appointments collaborate with provider  on medication access solutions  Follow Up Plan: Telephone follow up appointment with care management team member scheduled for: 2 months         The patient verbalized understanding of instructions, educational materials, and care plan provided today and declined offer to receive copy of patient instructions, educational materials, and care plan.   Telephone follow up appointment with care management team member scheduled for:  Signature Regina Eck, PharmD, BCPS Clinical Pharmacist, [place]  [facility] Phone [phone]

## 2020-09-01 DIAGNOSIS — J920 Pleural plaque with presence of asbestos: Secondary | ICD-10-CM | POA: Insufficient documentation

## 2020-09-08 ENCOUNTER — Other Ambulatory Visit: Payer: Self-pay

## 2020-09-08 ENCOUNTER — Ambulatory Visit (INDEPENDENT_AMBULATORY_CARE_PROVIDER_SITE_OTHER): Payer: Medicare Other | Admitting: Family Medicine

## 2020-09-08 ENCOUNTER — Encounter: Payer: Self-pay | Admitting: Family Medicine

## 2020-09-08 VITALS — BP 155/70 | HR 80 | Temp 97.5°F | Ht 66.0 in | Wt 235.8 lb

## 2020-09-08 DIAGNOSIS — H6121 Impacted cerumen, right ear: Secondary | ICD-10-CM | POA: Diagnosis not present

## 2020-09-08 DIAGNOSIS — H9193 Unspecified hearing loss, bilateral: Secondary | ICD-10-CM

## 2020-09-08 NOTE — Progress Notes (Signed)
Subjective: CC: cerumen impaction PCP: Dettinger, Fransisca Kaufmann, MD [id number] [name] is a 80 y.o. male presenting to clinic today for:  1. Cerumen impaction Saw audiology and they need his "ears cleaned out" in order to proceed.  His main problem is on the right.  He has been using Debrox for the last week in efforts to get this to loosen and clear but it has not.  He needs to get some hearing aids but admits that he was told that he would never hear out of the right ear again without some type of reconstructive surgery.  He has had "a needle stuck in his left ear to drain out the fluid before" and wonders if that can be done today because he still has fluid behind his left eardrum as well.   ROS: Per HPI  Allergies  Allergen Reactions   Ace Inhibitors Hives, Swelling and Rash    Rash,hives,tongue swelling   Angiotensin Receptor Blockers     Unknown reaction    Oxycodone-Acetaminophen Other (See Comments)    Unknown reaction   Robaxin [Methocarbamol] Other (See Comments)    Confusion    Toradol [Ketorolac Tromethamine] Other (See Comments)    confusion   Valium Other (See Comments)    Hallucinations; confusion   Doxycycline Hives and Rash   Past Medical History:  Diagnosis Date   Anxiety    Aortic atherosclerosis (HCC) 12/19/2017   Aortic stenosis    26 mm Edwards S3U THV November 2021   Arthritis    Bursitis of hip    Cervical spondylosis    Chronic headaches    Coronary atherosclerosis of native coronary artery    BMS nondominant RCA 12/2004, DES x2 to mid LAD 11/2019   Essential hypertension    GERD (gastroesophageal reflux disease)    History of adenomatous polyp of colon    History of kidney stones    History of MI (myocardial infarction) 12/2004   History of viral meningitis 02/28/2018   December 2019   Hyperlipidemia    Internal hemorrhoids    Low back pain    Nocturia more than twice per night    OSA (obstructive sleep apnea)    Spinal stenosis    Type  2 diabetes mellitus treated with insulin (HCC)    UTI (urinary tract infection)    Wears dentures    Upper    Current Outpatient Medications:    acetaminophen (TYLENOL) 650 MG CR tablet, Take 1,300 mg by mouth 2 (two) times daily., Disp: , Rfl:    amLODipine (NORVASC) 10 MG tablet, TAKE 1 TABLET BY MOUTH  DAILY, Disp: 90 tablet, Rfl: 3   aspirin EC 81 MG tablet, Take 81 mg by mouth at bedtime. , Disp: , Rfl:    atorvastatin (LIPITOR) 40 MG tablet, TAKE 1 TABLET BY MOUTH IN  THE EVENING (Patient taking differently: Take 40 mg by mouth daily.), Disp: 90 tablet, Rfl: 3   butalbital-acetaminophen-caffeine (FIORICET) 50-325-40 MG tablet, Take 1 tablet by mouth 2 (two) times daily as needed for headache., Disp: , Rfl:    cetirizine (ZYRTEC) 10 MG tablet, Take 10 mg by mouth daily with supper. , Disp: , Rfl:    chlorthalidone (HYGROTON) 25 MG tablet, Take 1 tablet (25 mg total) by mouth daily., Disp: 90 tablet, Rfl: 3   Cholecalciferol (VITAMIN D3) 50 MCG (2000 UT) capsule, Take 4,000 Units by mouth daily. , Disp: , Rfl:    Continuous Blood Gluc Receiver ([place]) [place]aya,  USE TO CHECK BLOOD SUGAR UP TO 4 TIMES DAILY AS DIRECTED. 1 DEVICE PER YEAR. DX: E11.9, Disp: 1 each, Rfl: 1   Continuous Blood Gluc Sensor (DEXCOM G6 SENSOR) MISC, USE TO CHECK BLOOD SUGAR UP TO 4 TIMES DAILY AS DIRECTED. CHANGE SENSOR EVERY 30 DAYS. DX: E11.9, Disp: 3 each, Rfl: 4   Continuous Blood Gluc Transmit (DEXCOM G6 TRANSMITTER) MISC, USE TO CHECK BLOOD SUGAR UP TO 4 TIMES DAILY AS DIRECTED. CHANGE TRANSMITTER EVERY 3 MONTHS. DX: E11.9, Disp: 1 each, Rfl: 12   docusate sodium (COLACE) 100 MG capsule, Take 100 mg by mouth daily as needed for mild constipation or moderate constipation. , Disp: , Rfl:    donepezil (ARICEPT) 5 MG tablet, Take 1 tablet (5 mg total) by mouth at bedtime., Disp: 90 tablet, Rfl: 3   famotidine (PEPCID) 20 MG tablet, Take 1 tablet (20 mg total) by mouth 2 (two) times daily with a meal., Disp:  180 tablet, Rfl: 3   furosemide (LASIX) 20 MG tablet, TAKE 1 TABLET BY MOUTH  DAILY AS NEEDED, Disp: 90 tablet, Rfl: 3   glucose blood (ONETOUCH ULTRA) test strip, Check BS TID Dx E11.59, Disp: 400 each, Rfl: 3   Insulin Glargine (BASAGLAR KWIKPEN) 100 UNIT/ML, Inject 56 Units into the skin at bedtime., Disp: 15 mL, Rfl: 3   insulin lispro (HUMALOG KWIKPEN) 100 UNIT/ML KwikPen, Inject 20 Units into the skin 3 (three) times daily with meals., Disp: 45 mL, Rfl: 11   isosorbide mononitrate (IMDUR) 30 MG 24 hr tablet, TAKE 1 TABLET BY MOUTH IN  THE EVENING, Disp: 90 tablet, Rfl: 3   metFORMIN (GLUCOPHAGE-XR) 500 MG 24 hr tablet, Take 2 tablets (1,000 mg total) by mouth 2 (two) times daily., Disp: 360 tablet, Rfl: 3   nitroGLYCERIN (NITROSTAT) 0.4 MG SL tablet, DISSOLVE ONE TABLET UNDER THE TONGUE EVERY 5 MINUTES AS NEEDED FOR CHEST PAIN.  DO NOT EXCEED A TOTAL OF 3 DOSES IN 15 MINUTES, Disp: 25 tablet, Rfl: 3   Omega-3 Fatty Acids (FISH OIL) 1200 MG CAPS, Take 1,200-2,400 mg by mouth See admin instructions. Tale 1200 mg in the morning and 2400 mg in the evening, Disp: , Rfl:    PARoxetine (PAXIL) 20 MG tablet, Take 1 tablet (20 mg total) by mouth daily., Disp: 90 tablet, Rfl: 3   tamsulosin (FLOMAX) 0.4 MG CAPS capsule, Take 1 capsule (0.4 mg total) by mouth daily., Disp: 90 capsule, Rfl: 3   Turmeric Curcumin 500 MG CAPS, Take 500 mg by mouth daily., Disp: , Rfl:    vitamin B-12 (CYANOCOBALAMIN) 1000 MCG tablet, Take 2,000 mcg by mouth daily., Disp: , Rfl:  Social History   Socioeconomic History   Marital status: Married    Spouse name: Dione    Number of children: 1   Years of education: HS   Highest education level: High school graduate  Occupational History   Occupation: RETIRED    Employer: [employer]    Comment: Power plant  Tobacco Use   Smoking status: Former    Packs/day: 1.00    Years: 20.00    Pack years: 20.00    Types: Cigarettes    Start date: 01/18/1963    Quit date:  08/11/1988    Years since quitting: 32.0   Smokeless tobacco: Former    Types: Chew    Quit date: 01/11/1989   Tobacco comments:    chewed 1 pack tobacco/day for 15 years  Vaping Use   Vaping Use: Never used  Substance and Sexual Activity   Alcohol use: No    Alcohol/week: 0.0 standard drinks   Drug use: No   Sexual activity: Yes  Other Topics Concern   Not on file  Social History Narrative   No regular exercise      Daily caffeine use: 3 cups   Patient is right handed.    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 between [phone]   Social Determinants of Health   Financial Resource Strain: Not on file  Food Insecurity: Not on file  Transportation Needs: Not on file  Physical Activity: Not on file  Stress: Not on file  Social Connections: Not on file  Intimate Partner Violence: Not on file   Family History  Problem Relation Age of Onset   Colon cancer Mother        Diagnosed age 27   Cancer Mother 31   Heart disease Father    Heart attack Father    Breast cancer Sister    Heart disease Brother    Brain cancer Sister    Heart disease Brother    Bladder Cancer Brother    Cancer - Other Brother     Objective: Office vital signs reviewed. BP (!) 155/70   Pulse 80   Temp (!) 97.5 F (36.4 C)   Ht '5\' 6"'$  (1.676 m)   Wt 235 lb 12.8 oz (107 kg)   SpO2 93%   BMI 38.06 kg/m   Physical Examination:  General: Awake, alert, well nourished, No acute distress HEENT: Normal    Neck: No masses palpated. No lymphadenopathy    Ears: Tympanic membranes obscured on the right due to impacted cerumen.  Left TM with no appreciable fluid level, normal light reflex, no erythema, no bulging    Assessment/ Plan: 80 y.o. male   Impacted cerumen of right ear  Bilateral hearing loss, unspecified hearing loss type  Impaction was technically difficult to remove as it did ultimately require an additional nurse to successfully  irrigate the right ear.  The very large chunk of cerumen came out and repeat ear examination demonstrated no cerumen within the external auditory canal.  Light reflex is dulled on the right compared to the left.  No perforation or other abnormality noted.  Reinforced reevaluation with ENT if he felt like he had persistent fluid behind the left ear.  Though this was not really visualized.  Discussed that we do not put needles in eardrums at this clinic.  No orders of the defined types were placed in this encounter.  No orders of the defined types were placed in this encounter.    Janora Norlander, DO [place] [phone]

## 2020-09-19 ENCOUNTER — Other Ambulatory Visit: Payer: Self-pay | Admitting: Physician Assistant

## 2020-09-19 DIAGNOSIS — Z952 Presence of prosthetic heart valve: Secondary | ICD-10-CM

## 2020-09-23 ENCOUNTER — Telehealth: Payer: Self-pay | Admitting: Family Medicine

## 2020-09-23 NOTE — Telephone Encounter (Signed)
Left message for patient to call back and schedule Medicare Annual Wellness Visit (AWV) by video or phone  Last AWV 06/07/2019  Please schedule at any time with [facility].  45-minute appointment  Any questions, please contact me at [phone]

## 2020-10-20 ENCOUNTER — Encounter: Payer: Self-pay | Admitting: Family Medicine

## 2020-10-20 ENCOUNTER — Other Ambulatory Visit: Payer: Self-pay

## 2020-10-20 ENCOUNTER — Ambulatory Visit (INDEPENDENT_AMBULATORY_CARE_PROVIDER_SITE_OTHER): Payer: Medicare Other | Admitting: Family Medicine

## 2020-10-20 VITALS — BP 134/56 | HR 62 | Ht 66.0 in | Wt 235.0 lb

## 2020-10-20 DIAGNOSIS — E1129 Type 2 diabetes mellitus with other diabetic kidney complication: Secondary | ICD-10-CM | POA: Diagnosis not present

## 2020-10-20 DIAGNOSIS — E1159 Type 2 diabetes mellitus with other circulatory complications: Secondary | ICD-10-CM | POA: Diagnosis not present

## 2020-10-20 DIAGNOSIS — E119 Type 2 diabetes mellitus without complications: Secondary | ICD-10-CM | POA: Diagnosis not present

## 2020-10-20 DIAGNOSIS — Z794 Long term (current) use of insulin: Secondary | ICD-10-CM | POA: Diagnosis not present

## 2020-10-20 DIAGNOSIS — E785 Hyperlipidemia, unspecified: Secondary | ICD-10-CM | POA: Diagnosis not present

## 2020-10-20 DIAGNOSIS — E1169 Type 2 diabetes mellitus with other specified complication: Secondary | ICD-10-CM

## 2020-10-20 DIAGNOSIS — Z23 Encounter for immunization: Secondary | ICD-10-CM | POA: Diagnosis not present

## 2020-10-20 DIAGNOSIS — I152 Hypertension secondary to endocrine disorders: Secondary | ICD-10-CM

## 2020-10-20 DIAGNOSIS — R809 Proteinuria, unspecified: Secondary | ICD-10-CM | POA: Diagnosis not present

## 2020-10-20 DIAGNOSIS — I2510 Atherosclerotic heart disease of native coronary artery without angina pectoris: Secondary | ICD-10-CM | POA: Diagnosis not present

## 2020-10-20 LAB — CBC WITH DIFFERENTIAL/PLATELET
Basophils Absolute: 0.1 10*3/uL (ref 0.0–0.2)
Basos: 1 %
EOS (ABSOLUTE): 0.2 10*3/uL (ref 0.0–0.4)
Eos: 3 %
Hematocrit: 38.6 % (ref 37.5–51.0)
Hemoglobin: 13.1 g/dL (ref 13.0–17.7)
Immature Grans (Abs): 0 10*3/uL (ref 0.0–0.1)
Immature Granulocytes: 1 %
Lymphocytes Absolute: 2 10*3/uL (ref 0.7–3.1)
Lymphs: 27 %
MCH: 28.7 pg (ref 26.6–33.0)
MCHC: 33.9 g/dL (ref 31.5–35.7)
MCV: 85 fL (ref 79–97)
Monocytes Absolute: 0.6 10*3/uL (ref 0.1–0.9)
Monocytes: 8 %
Neutrophils Absolute: 4.4 10*3/uL (ref 1.4–7.0)
Neutrophils: 60 %
Platelets: 290 10*3/uL (ref 150–450)
RBC: 4.57 x10E6/uL (ref 4.14–5.80)
RDW: 14.6 % (ref 11.6–15.4)
WBC: 7.3 10*3/uL (ref 3.4–10.8)

## 2020-10-20 LAB — BAYER DCA HB A1C WAIVED: HB A1C (BAYER DCA - WAIVED): 7.9 % — ABNORMAL HIGH (ref 4.8–5.6)

## 2020-10-20 LAB — LIPID PANEL
Chol/HDL Ratio: 3.7 ratio (ref 0.0–5.0)
Cholesterol, Total: 129 mg/dL (ref 100–199)
HDL: 35 mg/dL — ABNORMAL LOW (ref >39)
LDL Chol Calc (NIH): 63 mg/dL (ref 0–99)
Triglycerides: 187 mg/dL — ABNORMAL HIGH (ref 0–149)
VLDL Cholesterol Cal: 31 mg/dL (ref 5–40)

## 2020-10-20 NOTE — Progress Notes (Signed)
BP (!) 134/56   Pulse 62   Ht 5\' 6"  (1.676 m)   Wt 235 lb (106.6 kg)   SpO2 98%   BMI 37.93 kg/m    Subjective:   Patient ID: [name], male    DOB: 09/04/1940, 80 y.o.   MRN: [id number]  HPI: [name] is a 80 y.o. male presenting on 10/20/2020 for Medical Management of Chronic Issues, Diabetes, Hypertension, and Hyperlipidemia   HPI Type 2 diabetes mellitus Patient comes in today for recheck of his diabetes. Patient has been currently taking Humalog 20 units 3 times daily as needed with meals and Basaglar 56 units daily and metformin. Patient is not currently on an ACE inhibitor/ARB because he had an allergy to ACE inhibitor's. Patient has seen an ophthalmologist this year. Patient denies any issues with their feet. The symptom started onset as an adult microalbuminuria and CAD and circulatory disorder and hyperlipidemia and hypertension ARE RELATED TO DM   Hypertension Patient is currently on amlodipine and chlorthalidone and furosemide and Imdur, and their blood pressure today is 134/56. Patient denies any lightheadedness or dizziness. Patient denies headaches, blurred vision, chest pains, shortness of breath, or weakness. Denies any side effects from medication and is content with current medication.   Hyperlipidemia Patient is coming in for recheck of his hyperlipidemia. The patient is currently taking fish oils although he stopped them recently and atorvastatin. They deny any issues with myalgias or history of liver damage from it. They deny any focal numbness or weakness or chest pain.   Relevant past medical, surgical, family and social history reviewed and updated as indicated. Interim medical history since our last visit reviewed. Allergies and medications reviewed and updated.  Review of Systems  Constitutional:  Negative for chills and fever.  Eyes:  Negative for visual disturbance.  Respiratory:  Negative for shortness of breath and wheezing.    Cardiovascular:  Negative for chest pain and leg swelling.  Musculoskeletal:  Negative for back pain and gait problem.  Skin:  Negative for rash.  Neurological:  Negative for dizziness, weakness and light-headedness.  All other systems reviewed and are negative.  Per HPI unless specifically indicated above   Allergies as of 10/20/2020       Reactions   Ace Inhibitors Hives, Swelling, Rash   Rash,hives,tongue swelling   Angiotensin Receptor Blockers    Unknown reaction    Oxycodone-acetaminophen Other (See Comments)   Unknown reaction   Robaxin [methocarbamol] Other (See Comments)   Confusion    Toradol [ketorolac Tromethamine] Other (See Comments)   confusion   Valium Other (See Comments)   Hallucinations; confusion   Doxycycline Hives, Rash        Medication List        Accurate as of October 20, 2020  9:46 AM. If you have any questions, ask your nurse or doctor.          acetaminophen 650 MG CR tablet Commonly known as: TYLENOL Take 1,300 mg by mouth 2 (two) times daily.   amLODipine 10 MG tablet Commonly known as: NORVASC TAKE 1 TABLET BY MOUTH  DAILY   aspirin EC 81 MG tablet Take 81 mg by mouth at bedtime.   atorvastatin 40 MG tablet Commonly known as: LIPITOR TAKE 1 TABLET BY MOUTH IN  THE EVENING What changed: when to take this   Basaglar KwikPen 100 UNIT/ML Inject 56 Units into the skin at bedtime.   butalbital-acetaminophen-caffeine 50-325-40 MG tablet Commonly known as: FIORICET  Take 1 tablet by mouth 2 (two) times daily as needed for headache.   cetirizine 10 MG tablet Commonly known as: ZYRTEC Take 10 mg by mouth daily with supper.   chlorthalidone 25 MG tablet Commonly known as: HYGROTON Take 1 tablet (25 mg total) by mouth daily.   Dexcom G6 Receiver Devi USE TO CHECK BLOOD SUGAR UP TO 4 TIMES DAILY AS DIRECTED. 1 DEVICE PER YEAR. DX: E11.9   Dexcom G6 Sensor Misc USE TO CHECK BLOOD SUGAR UP TO 4 TIMES DAILY AS DIRECTED. CHANGE  SENSOR EVERY 30 DAYS. DX: E11.9   Dexcom G6 Transmitter Misc USE TO CHECK BLOOD SUGAR UP TO 4 TIMES DAILY AS DIRECTED. CHANGE TRANSMITTER EVERY 3 MONTHS. DX: E11.9   docusate sodium 100 MG capsule Commonly known as: COLACE Take 100 mg by mouth daily as needed for mild constipation or moderate constipation.   donepezil 5 MG tablet Commonly known as: ARICEPT Take 1 tablet (5 mg total) by mouth at bedtime.   famotidine 20 MG tablet Commonly known as: PEPCID Take 1 tablet (20 mg total) by mouth 2 (two) times daily with a meal.   Fish Oil 1200 MG Caps Take 1,200-2,400 mg by mouth See admin instructions. Tale 1200 mg in the morning and 2400 mg in the evening   furosemide 20 MG tablet Commonly known as: LASIX TAKE 1 TABLET BY MOUTH  DAILY AS NEEDED   insulin lispro 100 UNIT/ML KwikPen Commonly known as: HumaLOG KwikPen Inject 20 Units into the skin 3 (three) times daily with meals.   isosorbide mononitrate 30 MG 24 hr tablet Commonly known as: IMDUR TAKE 1 TABLET BY MOUTH IN  THE EVENING   metFORMIN 500 MG 24 hr tablet Commonly known as: GLUCOPHAGE-XR Take 2 tablets (1,000 mg total) by mouth 2 (two) times daily.   nitroGLYCERIN 0.4 MG SL tablet Commonly known as: NITROSTAT DISSOLVE ONE TABLET UNDER THE TONGUE EVERY 5 MINUTES AS NEEDED FOR CHEST PAIN.  DO NOT EXCEED A TOTAL OF 3 DOSES IN 15 MINUTES   OneTouch Ultra test strip Generic drug: glucose blood Check BS TID Dx E11.59   PARoxetine 20 MG tablet Commonly known as: PAXIL Take 1 tablet (20 mg total) by mouth daily.   tamsulosin 0.4 MG Caps capsule Commonly known as: FLOMAX Take 1 capsule (0.4 mg total) by mouth daily.   Turmeric Curcumin 500 MG Caps Take 500 mg by mouth daily.   vitamin B-12 1000 MCG tablet Commonly known as: CYANOCOBALAMIN Take 2,000 mcg by mouth daily.   Vitamin D3 50 MCG (2000 UT) capsule Take 4,000 Units by mouth daily.         Objective:   BP (!) 134/56   Pulse 62   Ht 5\' 6"   (1.676 m)   Wt 235 lb (106.6 kg)   SpO2 98%   BMI 37.93 kg/m   Wt Readings from Last 3 Encounters:  10/20/20 235 lb (106.6 kg)  09/08/20 235 lb 12.8 oz (107 kg)  08/14/20 236 lb (107 kg)    Physical Exam Vitals and nursing note reviewed.  Constitutional:      General: He is not in acute distress.    Appearance: He is well-developed. He is not diaphoretic.  Eyes:     General: No scleral icterus.    Conjunctiva/sclera: Conjunctivae normal.  Neck:     Thyroid: No thyromegaly.  Cardiovascular:     Rate and Rhythm: Normal rate and regular rhythm.     Heart sounds: Normal heart sounds.  No murmur heard. Pulmonary:     Effort: Pulmonary effort is normal. No respiratory distress.     Breath sounds: Normal breath sounds. No wheezing.  Musculoskeletal:        General: Normal range of motion.     Cervical back: Neck supple.  Lymphadenopathy:     Cervical: No cervical adenopathy.  Skin:    General: Skin is warm and dry.     Findings: No rash.  Neurological:     Mental Status: He is alert and oriented to person, place, and time.     Coordination: Coordination normal.  Psychiatric:        Behavior: Behavior normal.      Assessment & Plan:   Problem List Items Addressed This Visit       Cardiovascular and Mediastinum   Hypertension associated with diabetes ([place])   CORONARY ATHEROSCLEROSIS NATIVE CORONARY ARTERY     Endocrine   Hyperlipidemia associated with type 2 diabetes mellitus ([place])   Relevant Orders   Lipid panel   Bayer DCA Hb A1c Waived   CBC with Differential/Platelet   Type 2 diabetes mellitus with circulatory disorder (HCC)   Microalbuminuria due to type 2 diabetes mellitus ([place])   Other Visit Diagnoses     Type 2 diabetes mellitus without complication, without long-term current use of insulin ([place])    -  Primary   Relevant Orders   Lipid panel   Bayer DCA Hb A1c Waived   CBC with Differential/Platelet   Need for immunization against influenza        Relevant Orders   Flu Vaccine QUAD High Dose(Fluad) (Completed)      Patients blood sugars are elevated and A1c is 7.9, will give sample of Ozempic that he can try 0.5 mg weekly and see if that improves things.  We will send prescription and if improved.  Continue other medicine currently and will await blood work.  Follow up plan: Return in about 3 months (around 01/20/2021), or if symptoms worsen or fail to improve, for Diabetes and hypertension.  Counseling provided for all of the vaccine components Orders Placed This Encounter  Procedures   Flu Vaccine QUAD High Dose(Fluad)   Lipid panel   Bayer DCA Hb A1c Waived   CBC with Differential/Platelet    Caryl Pina, MD [facility] 10/20/2020, 9:46 AM

## 2020-10-27 DIAGNOSIS — G4733 Obstructive sleep apnea (adult) (pediatric): Secondary | ICD-10-CM | POA: Diagnosis not present

## 2020-10-29 ENCOUNTER — Telehealth: Payer: Self-pay | Admitting: Family Medicine

## 2020-10-29 MED ORDER — BASAGLAR KWIKPEN 100 UNIT/ML ~~LOC~~ SOPN: 56.0000 [IU] | PEN_INJECTOR | Freq: Every day | SUBCUTANEOUS | 3 refills | Status: DC

## 2020-10-29 NOTE — Telephone Encounter (Signed)
  Prescription Request  10/29/2020  Is this a "Controlled Substance" medicine? no Have you seen your PCP in the last 2 weeks? yes If YES, route message to pool  -  If NO, patient needs to be scheduled for appointment.  What is the name of the medication or equipment? Basaglar Have you contacted your pharmacy to request a refill?  yes Which pharmacy would you like this sent to? [facility] in [place], [place] for mail order [phone]   Patient notified that their request is being sent to the clinical staff for review and that they should receive a response within 2 business days.    Dettinger's pt.  Please call wife.

## 2020-10-29 NOTE — Telephone Encounter (Signed)
Pt had no more refills on his Basaglar with mail order [facility]. Was just seen 10/20/20 Refills sent to pharmacy

## 2020-10-30 DIAGNOSIS — H906 Mixed conductive and sensorineural hearing loss, bilateral: Secondary | ICD-10-CM | POA: Diagnosis not present

## 2020-11-04 DIAGNOSIS — H90A32 Mixed conductive and sensorineural hearing loss, unilateral, left ear with restricted hearing on the contralateral side: Secondary | ICD-10-CM | POA: Diagnosis not present

## 2020-11-04 DIAGNOSIS — H6523 Chronic serous otitis media, bilateral: Secondary | ICD-10-CM | POA: Insufficient documentation

## 2020-11-04 DIAGNOSIS — H6522 Chronic serous otitis media, left ear: Secondary | ICD-10-CM | POA: Diagnosis not present

## 2020-11-11 ENCOUNTER — Encounter: Payer: Self-pay | Admitting: Dermatology

## 2020-11-11 ENCOUNTER — Ambulatory Visit: Payer: Medicare Other | Admitting: Dermatology

## 2020-11-11 ENCOUNTER — Other Ambulatory Visit: Payer: Self-pay

## 2020-11-11 DIAGNOSIS — L820 Inflamed seborrheic keratosis: Secondary | ICD-10-CM | POA: Diagnosis not present

## 2020-11-11 DIAGNOSIS — L821 Other seborrheic keratosis: Secondary | ICD-10-CM | POA: Diagnosis not present

## 2020-11-11 DIAGNOSIS — D485 Neoplasm of uncertain behavior of skin: Secondary | ICD-10-CM

## 2020-11-11 NOTE — Patient Instructions (Signed)

## 2020-11-13 DIAGNOSIS — E119 Type 2 diabetes mellitus without complications: Secondary | ICD-10-CM | POA: Diagnosis not present

## 2020-11-13 DIAGNOSIS — Z794 Long term (current) use of insulin: Secondary | ICD-10-CM | POA: Diagnosis not present

## 2020-11-14 ENCOUNTER — Telehealth: Payer: Self-pay | Admitting: *Deleted

## 2020-11-14 MED ORDER — OZEMPIC (0.25 OR 0.5 MG/DOSE) 2 MG/1.5ML ~~LOC~~ SOPN: 0.5000 mg | PEN_INJECTOR | SUBCUTANEOUS | 3 refills | Status: DC

## 2020-11-14 NOTE — Telephone Encounter (Signed)
Pt doing well on the Ozempic 0.5 mg samples that were given Needs script sent to [facility]

## 2020-11-14 NOTE — Telephone Encounter (Signed)
Patient aware and verbalized understanding. °

## 2020-11-14 NOTE — Telephone Encounter (Signed)
Sent prescription for Ozempic 5 mg to his pharmacy, may need to do prescription assistance application, if needs to do that then please come see Lottie Dawson and her team

## 2020-11-14 NOTE — Addendum Note (Signed)
Addended by: Caryl Pina on: 11/14/2020 10:12 AM   Modules accepted: Orders

## 2020-11-17 ENCOUNTER — Telehealth: Payer: Self-pay | Admitting: Family Medicine

## 2020-11-17 NOTE — Telephone Encounter (Signed)
LVM for pt to rtn my call to schedule AWV with [facility]. Please schedule if pt calls the office.  ?

## 2020-11-20 DIAGNOSIS — H6523 Chronic serous otitis media, bilateral: Secondary | ICD-10-CM | POA: Diagnosis not present

## 2020-11-26 ENCOUNTER — Encounter: Payer: Self-pay | Admitting: Dermatology

## 2020-11-26 NOTE — Progress Notes (Signed)
   Follow-Up Visit   Subjective  [name] is a 80 y.o. male who presents for the following: Skin Problem (Wants place on right eye, left knee & right side - wants spots removed).  Lesions that have grown and concern patient Location:  Duration:  Quality:  Associated Signs/Symptoms: Modifying Factors:  Severity:  Timing: Context:   Objective  Well appearing patient in no apparent distress; mood and affect are within normal limits. Right Malar Cheek Slightly inflamed pink crust, dermoscopy favors irritated keratosis over superficial carcinoma but biopsy will be obtained       Right Flank Inflamed 6 mm brown crust           All skin waist up examined.   Assessment & Plan    Neoplasm of uncertain behavior of skin (2) Right Malar Cheek  Skin / nail biopsy Type of biopsy: tangential   Informed consent: discussed and consent obtained   Timeout: patient name, date of birth, surgical site, and procedure verified   Procedure prep:  Patient was prepped and draped in usual sterile fashion (Non sterile) Prep type:  Chlorhexidine Anesthesia: the lesion was anesthetized in a standard fashion   Anesthetic:  1% lidocaine w/ epinephrine 1-100,000 local infiltration Instrument used: flexible razor blade   Outcome: patient tolerated procedure well   Post-procedure details: wound care instructions given    Specimen 1 - Surgical pathology Differential Diagnosis: r/o sk  Check Margins: No  Right Flank  Skin / nail biopsy Type of biopsy: tangential   Informed consent: discussed and consent obtained   Timeout: patient name, date of birth, surgical site, and procedure verified   Procedure prep:  Patient was prepped and draped in usual sterile fashion (Non sterile) Prep type:  Chlorhexidine Anesthesia: the lesion was anesthetized in a standard fashion   Anesthetic:  1% lidocaine w/ epinephrine 1-100,000 local infiltration Instrument used: flexible razor blade    Outcome: patient tolerated procedure well   Post-procedure details: wound care instructions given    Specimen 2 - Surgical pathology Differential Diagnosis: r/o sk Check Margins: No      I, Lavonna Monarch, MD, have reviewed all documentation for this visit.  The documentation on 11/26/20 for the exam, diagnosis, procedures, and orders are all accurate and complete.

## 2020-11-28 ENCOUNTER — Ambulatory Visit (INDEPENDENT_AMBULATORY_CARE_PROVIDER_SITE_OTHER): Payer: Medicare Other

## 2020-11-28 VITALS — Ht 66.0 in | Wt 230.0 lb

## 2020-11-28 DIAGNOSIS — Z0000 Encounter for general adult medical examination without abnormal findings: Secondary | ICD-10-CM | POA: Diagnosis not present

## 2020-11-28 NOTE — Patient Instructions (Signed)
[name] , Thank you for taking time to come for your Medicare Wellness Visit. I appreciate your ongoing commitment to your health goals. Please review the following plan we discussed and let me know if I can assist you in the future.   Screening recommendations/referrals: Colonoscopy: Done 05/09/2014 - Repeat in 10 years Recommended yearly ophthalmology/optometry visit for glaucoma screening and checkup Recommended yearly dental visit for hygiene and checkup  Vaccinations: Influenza vaccine: Done 10/20/2020 - Repeat annually Pneumococcal vaccine: Done 10/26/2016 & 10/61/2018 Tdap vaccine: Done 01/01/2013 - Repeat in 10 years  Shingles vaccine: Done   Done 05/14/2016 & 01/27/2017 Covid-19: Done 04/13/2019, 05/07/2019, 11/08/2019, & 09/10/2020  Advanced directives: in chart  Conditions/risks identified: Aim for 30 minutes of exercise or brisk walking each day, drink 6-8 glasses of water and eat lots of fruits and vegetables.   Next appointment: Follow up in one year for your annual wellness visit.   Preventive Care 80 Years and Older, Male  Preventive care refers to lifestyle choices and visits with your health care provider that can promote health and wellness. What does preventive care include? A yearly physical exam. This is also called an annual well check. Dental exams once or twice a year. Routine eye exams. Ask your health care provider how often you should have your eyes checked. Personal lifestyle choices, including: Daily care of your teeth and gums. Regular physical activity. Eating a healthy diet. Avoiding tobacco and drug use. Limiting alcohol use. Practicing safe sex. Taking low doses of aspirin every day. Taking vitamin and mineral supplements as recommended by your health care provider. What happens during an annual well check? The services and screenings done by your health care provider during your annual well check will depend on your age, overall health, lifestyle  risk factors, and family history of disease. Counseling  Your health care provider may ask you questions about your: Alcohol use. Tobacco use. Drug use. Emotional well-being. Home and relationship well-being. Sexual activity. Eating habits. History of falls. Memory and ability to understand (cognition). Work and work Statistician. Screening  You may have the following tests or measurements: Height, weight, and BMI. Blood pressure. Lipid and cholesterol levels. These may be checked every 5 years, or more frequently if you are over 37 years old. Skin check. Lung cancer screening. You may have this screening every year starting at age 4 if you have a 30-pack-year history of smoking and currently smoke or have quit within the past 15 years. Fecal occult blood test (FOBT) of the stool. You may have this test every year starting at age 80. Flexible sigmoidoscopy or colonoscopy. You may have a sigmoidoscopy every 5 years or a colonoscopy every 10 years starting at age 80. Prostate cancer screening. Recommendations will vary depending on your family history and other risks. Hepatitis C blood test. Hepatitis B blood test. Sexually transmitted disease (STD) testing. Diabetes screening. This is done by checking your blood sugar (glucose) after you have not eaten for a while (fasting). You may have this done every 1-3 years. Abdominal aortic aneurysm (AAA) screening. You may need this if you are a current or former smoker. Osteoporosis. You may be screened starting at age 80 if you are at high risk. Talk with your health care provider about your test results, treatment options, and if necessary, the need for more tests. Vaccines  Your health care provider may recommend certain vaccines, such as: Influenza vaccine. This is recommended every year. Tetanus, diphtheria, and acellular pertussis (Tdap, Td)  vaccine. You may need a Td booster every 10 years. Zoster vaccine. You may need this after age  80. Pneumococcal 13-valent conjugate (PCV13) vaccine. One dose is recommended after age 80. Pneumococcal polysaccharide (PPSV23) vaccine. One dose is recommended after age 80. Talk to your health care provider about which screenings and vaccines you need and how often you need them. This information is not intended to replace advice given to you by your health care provider. Make sure you discuss any questions you have with your health care provider. Document Released: 01/24/2015 Document Revised: 09/17/2015 Document Reviewed: 10/29/2014 Elsevier Interactive Patient Education  2017 [place] Prevention in the Home Falls can cause injuries. They can happen to people of all ages. There are many things you can do to make your home safe and to help prevent falls. What can I do on the outside of my home? Regularly fix the edges of walkways and driveways and fix any cracks. Remove anything that might make you trip as you walk through a door, such as a raised step or threshold. Trim any bushes or trees on the path to your home. Use bright outdoor lighting. Clear any walking paths of anything that might make someone trip, such as rocks or tools. Regularly check to see if handrails are loose or broken. Make sure that both sides of any steps have handrails. Any raised decks and porches should have guardrails on the edges. Have any leaves, snow, or ice cleared regularly. Use sand or salt on walking paths during winter. Clean up any spills in your garage right away. This includes oil or grease spills. What can I do in the bathroom? Use night lights. Install grab bars by the toilet and in the tub and shower. Do not use towel bars as grab bars. Use non-skid mats or decals in the tub or shower. If you need to sit down in the shower, use a plastic, non-slip stool. Keep the floor dry. Clean up any water that spills on the floor as soon as it happens. Remove soap buildup in the tub or shower  regularly. Attach bath mats securely with double-sided non-slip rug tape. Do not have throw rugs and other things on the floor that can make you trip. What can I do in the bedroom? Use night lights. Make sure that you have a light by your bed that is easy to reach. Do not use any sheets or blankets that are too big for your bed. They should not hang down onto the floor. Have a firm chair that has side arms. You can use this for support while you get dressed. Do not have throw rugs and other things on the floor that can make you trip. What can I do in the kitchen? Clean up any spills right away. Avoid walking on wet floors. Keep items that you use a lot in easy-to-reach places. If you need to reach something above you, use a strong step stool that has a grab bar. Keep electrical cords out of the way. Do not use floor polish or wax that makes floors slippery. If you must use wax, use non-skid floor wax. Do not have throw rugs and other things on the floor that can make you trip. What can I do with my stairs? Do not leave any items on the stairs. Make sure that there are handrails on both sides of the stairs and use them. Fix handrails that are broken or loose. Make sure that handrails are as long  as the stairways. Check any carpeting to make sure that it is firmly attached to the stairs. Fix any carpet that is loose or worn. Avoid having throw rugs at the top or bottom of the stairs. If you do have throw rugs, attach them to the floor with carpet tape. Make sure that you have a light switch at the top of the stairs and the bottom of the stairs. If you do not have them, ask someone to add them for you. What else can I do to help prevent falls? Wear shoes that: Do not have high heels. Have rubber bottoms. Are comfortable and fit you well. Are closed at the toe. Do not wear sandals. If you use a stepladder: Make sure that it is fully opened. Do not climb a closed stepladder. Make sure that  both sides of the stepladder are locked into place. Ask someone to hold it for you, if possible. Clearly mark and make sure that you can see: Any grab bars or handrails. First and last steps. Where the edge of each step is. Use tools that help you move around (mobility aids) if they are needed. These include: Canes. Walkers. Scooters. Crutches. Turn on the lights when you go into a dark area. Replace any light bulbs as soon as they burn out. Set up your furniture so you have a clear path. Avoid moving your furniture around. If any of your floors are uneven, fix them. If there are any pets around you, be aware of where they are. Review your medicines with your doctor. Some medicines can make you feel dizzy. This can increase your chance of falling. Ask your doctor what other things that you can do to help prevent falls. This information is not intended to replace advice given to you by your health care provider. Make sure you discuss any questions you have with your health care provider. Document Released: 10/24/2008 Document Revised: 06/05/2015 Document Reviewed: 02/01/2014 Elsevier Interactive Patient Education  2017 [employer].

## 2020-11-28 NOTE — Progress Notes (Signed)
Subjective:   [name] is a 80 y.o. male who presents for Medicare Annual/Subsequent preventive examination.  Virtual Visit via Telephone Note  I connected with  [name] on 11/28/20 at  3:30 PM EST by telephone and verified that I am speaking with the correct person using two identifiers.  Location: Patient: Home Provider: [facility] Persons participating in the virtual visit: patient/wife/Nurse Health Advisor   I discussed the limitations, risks, security and privacy concerns of performing an evaluation and management service by telephone and the availability of in person appointments. The patient expressed understanding and agreed to proceed.  Interactive audio and video telecommunications were attempted between this nurse and patient, however failed, due to patient having technical difficulties OR patient did not have access to video capability.  We continued and completed visit with audio only.  Some vital signs may be absent or patient reported.   Story [name] E Kamali Nephew, LPN   Review of Systems     Cardiac Risk Factors include: advanced age (>72men, >67 women);male gender;obesity (BMI >30kg/m2);sedentary lifestyle;hypertension;dyslipidemia;diabetes mellitus;Other (see comment), Risk factor comments: atherosclerosis, OSA on CPAP     Objective:    Today's Vitals   11/28/20 1558  Weight: 230 lb (104.3 kg)  Height: 5\' 6"  (1.676 m)   Body mass index is 37.12 kg/m.  Advanced Directives 11/28/2020 07/01/2020 07/01/2020 11/30/2019 11/22/2019 11/14/2019 06/07/2019  Does Patient Have a Medical Advance Directive? Yes Yes No Yes Yes Yes Yes  Type of Paramedic of [place];Living will Healthcare Power of [place];Living will [place];Living will [place];Living will [place];Living will  Does patient want to make changes to medical advance directive? - No - Patient  declined - - No - Patient declined - No - Patient declined  Copy of [place] in Chart? Yes - validated most recent copy scanned in chart (See row information) No - copy requested - No - copy requested No - copy requested - No - copy requested  Would patient like information on creating a medical advance directive? - No - Patient declined No - Patient declined - - - -  Pre-existing out of facility DNR order (yellow form or pink MOST form) - - - - - - -    Current Medications (verified) Outpatient Encounter Medications as of 11/28/2020  Medication Sig   acetaminophen (TYLENOL) 650 MG CR tablet Take 1,300 mg by mouth 2 (two) times daily.   amLODipine (NORVASC) 10 MG tablet TAKE 1 TABLET BY MOUTH  DAILY   aspirin EC 81 MG tablet Take 81 mg by mouth at bedtime.    atorvastatin (LIPITOR) 40 MG tablet TAKE 1 TABLET BY MOUTH IN  THE EVENING (Patient taking differently: Take 40 mg by mouth daily.)   butalbital-acetaminophen-caffeine (FIORICET) 50-325-40 MG tablet Take 1 tablet by mouth 2 (two) times daily as needed for headache.   cetirizine (ZYRTEC) 10 MG tablet Take 10 mg by mouth daily with supper.    chlorthalidone (HYGROTON) 25 MG tablet Take 1 tablet (25 mg total) by mouth daily.   Cholecalciferol (VITAMIN D3) 50 MCG (2000 UT) capsule Take 4,000 Units by mouth daily.    Continuous Blood Gluc Receiver (DEXCOM G6 RECEIVER) DEVI USE TO CHECK BLOOD SUGAR UP TO 4 TIMES DAILY AS DIRECTED. 1 DEVICE PER YEAR. DX: E11.9   Continuous Blood Gluc Sensor (DEXCOM G6 SENSOR) MISC USE TO CHECK BLOOD SUGAR UP TO 4 TIMES DAILY  AS DIRECTED. CHANGE SENSOR EVERY 30 DAYS. DX: E11.9   Continuous Blood Gluc Transmit (DEXCOM G6 TRANSMITTER) MISC USE TO CHECK BLOOD SUGAR UP TO 4 TIMES DAILY AS DIRECTED. CHANGE TRANSMITTER EVERY 3 MONTHS. DX: E11.9   docusate sodium (COLACE) 100 MG capsule Take 100 mg by mouth daily as needed for mild constipation or moderate constipation.    donepezil (ARICEPT) 5 MG  tablet Take 1 tablet (5 mg total) by mouth at bedtime.   famotidine (PEPCID) 20 MG tablet Take 1 tablet (20 mg total) by mouth 2 (two) times daily with a meal.   fluticasone (FLONASE) 50 MCG/ACT nasal spray Place 2 sprays into both nostrils daily.   furosemide (LASIX) 20 MG tablet TAKE 1 TABLET BY MOUTH  DAILY AS NEEDED   glucose blood (ONETOUCH ULTRA) test strip Check BS TID Dx E11.59   Insulin Glargine (BASAGLAR KWIKPEN) 100 UNIT/ML Inject 56 Units into the skin at bedtime.   insulin lispro (HUMALOG KWIKPEN) 100 UNIT/ML KwikPen Inject 20 Units into the skin 3 (three) times daily with meals.   isosorbide mononitrate (IMDUR) 30 MG 24 hr tablet TAKE 1 TABLET BY MOUTH IN  THE EVENING   metFORMIN (GLUCOPHAGE-XR) 500 MG 24 hr tablet Take 2 tablets (1,000 mg total) by mouth 2 (two) times daily.   nitroGLYCERIN (NITROSTAT) 0.4 MG SL tablet DISSOLVE ONE TABLET UNDER THE TONGUE EVERY 5 MINUTES AS NEEDED FOR CHEST PAIN.  DO NOT EXCEED A TOTAL OF 3 DOSES IN 15 MINUTES   Omega-3 Fatty Acids (FISH OIL) 1200 MG CAPS Take 1,200-2,400 mg by mouth See admin instructions. Tale 1200 mg in the morning and 2400 mg in the evening   PARoxetine (PAXIL) 20 MG tablet Take 1 tablet (20 mg total) by mouth daily.   Semaglutide,0.25 or 0.5MG /DOS, (OZEMPIC, 0.25 OR 0.5 MG/DOSE,) 2 MG/1.5ML SOPN Inject 0.5 mg into the skin once a week.   tamsulosin (FLOMAX) 0.4 MG CAPS capsule Take 1 capsule (0.4 mg total) by mouth daily.   Turmeric Curcumin 500 MG CAPS Take 500 mg by mouth daily.   vitamin B-12 (CYANOCOBALAMIN) 1000 MCG tablet Take 2,000 mcg by mouth daily.   No facility-administered encounter medications on file as of 11/28/2020.    Allergies (verified) Ace inhibitors, Angiotensin receptor blockers, Oxycodone-acetaminophen, Robaxin [methocarbamol], Toradol [ketorolac tromethamine], Valium, and Doxycycline   History: Past Medical History:  Diagnosis Date   Anxiety    Aortic atherosclerosis ([place]) 12/19/2017   Aortic  stenosis    26 mm Edwards S3U THV November 2021   Arthritis    Bursitis of hip    Cervical spondylosis    Chronic headaches    Coronary atherosclerosis of native coronary artery    BMS nondominant RCA 12/2004, DES x2 to mid LAD 11/2019   Essential hypertension    GERD (gastroesophageal reflux disease)    History of adenomatous polyp of colon    History of kidney stones    History of MI (myocardial infarction) 12/2004   History of viral meningitis 02/28/2018   December 2019   Hyperlipidemia    Internal hemorrhoids    Low back pain    Nocturia more than twice per night    OSA (obstructive sleep apnea)    Spinal stenosis    Type 2 diabetes mellitus treated with insulin (HCC)    UTI (urinary tract infection)    Wears dentures    Upper   Past Surgical History:  Procedure Laterality Date   CARDIAC CATHETERIZATION  06-14-2004  dr Lia Foyer  total occlusion mD1, RI 30-40%, mRCA nondominant hazy 80% and scattered 30-40%,  ef 71% (medical mangement)   CARDIOVASCULAR STRESS TEST  09-17-2015   dr Domenic Polite   Low risk nuclear study w/ reversible mild small anteroapical defect,  normal LV function and wall motion, nuclear stress ef 61%   CATARACT EXTRACTION W/ INTRAOCULAR LENS IMPLANT Right 07/2014   COLONOSCOPY  09/10/2008   normal   CORONARY ANGIOPLASTY WITH STENT PLACEMENT  12-17-2004   dr benismhon/ dr Olevia Perches   nonobstructive cad LAD and LCFx,  BMS x1 to total occlusion RCA    CORONARY ATHERECTOMY  11/22/2019   CORONARY ATHERECTOMY N/A 11/22/2019   Procedure: CORONARY ATHERECTOMY;  Surgeon: Burnell Blanks, MD;  Location: [place] [facility];  Service: Cardiovascular;  Laterality: N/A;   CYSTOSCOPY W/ URETEROSCOPY W/ LITHOTRIPSY  08/2010   ERCP  03/14/2008   EYE SURGERY Bilateral    cataracts   LAPAROSCOPIC CHOLECYSTECTOMY  05/2012   LEFT HEART CATHETERIZATION WITH CORONARY ANGIOGRAM N/A 05/14/2011   Procedure: LEFT HEART CATHETERIZATION WITH CORONARY ANGIOGRAM;  Surgeon: Sherren Mocha, MD;  Location: [facility] CATH LAB;  Service: Cardiovascular;  Laterality: N/A;    non-obstructive LM, LAD, LCFx, patent RCA stent   LUMBAR LAMINECTOMY/DECOMPRESSION MICRODISCECTOMY N/A 02/24/2017   Procedure: Microlumbar decompression L4-5, L5-S1;  Surgeon: Susa Day, MD;  Location: [facility];  Service: Orthopedics;  Laterality: N/A;  120 mins   RIGHT/LEFT HEART CATH AND CORONARY ANGIOGRAPHY N/A 11/14/2019   Procedure: RIGHT/LEFT HEART CATH AND CORONARY ANGIOGRAPHY;  Surgeon: Burnell Blanks, MD;  Location: [place] [facility];  Service: Cardiovascular;  Laterality: N/A;   ROTATOR CUFF REPAIR Right 03/2002   ROTATOR CUFF REPAIR Left 12/11/2015   TEE WITHOUT CARDIOVERSION N/A 12/04/2019   Procedure: TRANSESOPHAGEAL ECHOCARDIOGRAM (TEE);  Surgeon: Burnell Blanks, MD;  Location: [place] [facility];  Service: Open Heart Surgery;  Laterality: N/A;   TOTAL HIP ARTHROPLASTY Right 12/20/2008   TOTAL HIP ARTHROPLASTY Left 07/05/2018   Procedure: TOTAL HIP ARTHROPLASTY ANTERIOR APPROACH;  Surgeon: Gaynelle Arabian, MD;  Location: [facility];  Service: Orthopedics;  Laterality: Left;  160min   TOTAL KNEE ARTHROPLASTY Bilateral left 12-11-2003/  right 07-31-2004   dr Wynelle Link [facility]   TRANSCATHETER AORTIC VALVE REPLACEMENT, TRANSFEMORAL N/A 12/04/2019   Procedure: TRANSCATHETER AORTIC VALVE REPLACEMENT, TRANSFEMORAL;  Surgeon: Burnell Blanks, MD;  Location: [place] [facility];  Service: Open Heart Surgery;  Laterality: N/A;   UPPER GASTROINTESTINAL ENDOSCOPY  01/08/2008   bx, inlet patch, duodenitis   YAG LASER APPLICATION Right 05/14/5463   Procedure: YAG LASER APPLICATION;  Surgeon: Williams Che, MD;  Location: [facility];  Service: Ophthalmology;  Laterality: Right;   Family History  Problem Relation Age of Onset   Colon cancer Mother        Diagnosed age 20   Cancer Mother 78   Heart disease Father    Heart attack Father    Breast cancer Sister    Heart disease Brother    Brain  cancer Sister    Heart disease Brother    Bladder Cancer Brother    Cancer - Other Brother    Social History   Socioeconomic History   Marital status: Married    Spouse name: Dione    Number of children: 1   Years of education: HS   Highest education level: High school graduate  Occupational History   Occupation: RETIRED    Employer: [employer]    Comment: Power plant  Tobacco  Use   Smoking status: Former    Packs/day: 1.00    Years: 20.00    Pack years: 20.00    Types: Cigarettes    Start date: 01/18/1963    Quit date: 08/11/1988    Years since quitting: 32.3   Smokeless tobacco: Former    Types: Chew    Quit date: 01/11/1989   Tobacco comments:    chewed 1 pack tobacco/day for 15 years  Vaping Use   Vaping Use: Never used  Substance and Sexual Activity   Alcohol use: No    Alcohol/week: 0.0 standard drinks   Drug use: No   Sexual activity: Yes  Other Topics Concern   Not on file  Social History Narrative   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 between [phone]   Social Determinants of Health   Financial Resource Strain: Low Risk    Difficulty of Paying Living Expenses: Not very hard  Food Insecurity: No Food Insecurity   Worried About Charity fundraiser in the Last Year: Never true   Arboriculturist in the Last Year: Never true  Transportation Needs: No Transportation Needs   Lack of Transportation (Medical): No   Lack of Transportation (Non-Medical): No  Physical Activity: Sufficiently Active   Days of Exercise per Week: 3 days   Minutes of Exercise per Session: 60 min  Stress: No Stress Concern Present   Feeling of Stress : Only a little  Social Connections: Moderately Isolated   Frequency of Communication with Friends and Family: More than three times a week   Frequency of  Social Gatherings with Friends and Family: Once a week   Attends Religious Services: Never   Marine scientist or Organizations: No   Attends Music therapist: Never   Marital Status: Married    Tobacco Counseling Counseling given: Not Answered Tobacco comments: chewed 1 pack tobacco/day for 15 years   Clinical Intake:  Pre-visit preparation completed: Yes  Pain : No/denies pain     BMI - recorded: 37.12 Nutritional Status: BMI > 30  Obese Nutritional Risks: None Diabetes: Yes CBG done?: No Did pt. bring in CBG monitor from home?: No  How often do you need to have someone help you when you read instructions, pamphlets, or other written materials from your doctor or pharmacy?: 1 - Never  Diabetic? Nutrition Risk Assessment:  Has the patient had any N/V/D within the last 2 months?  No  Does the patient have any non-healing wounds?  No  Has the patient had any unintentional weight loss or weight gain?  No   Diabetes:  Is the patient diabetic?  Yes  If diabetic, was a CBG obtained today?  No  Did the patient bring in their glucometer from home?  No  How often do you monitor your CBG's? DexCom - 4-5 times per day.   Financial Strains and Diabetes Management:  Are you having any financial strains with the device, your supplies or your medication? No .  Does the patient want to be seen by Chronic Care Management for management of their diabetes?  No  Would the patient like to be referred to a Nutritionist or for Diabetic Management?  No   Diabetic Exams:  Diabetic Eye Exam: Completed 03/26/2020.   Diabetic Foot Exam: Completed 04/02/2020. Pt has been advised about the importance in completing this exam. Pt is scheduled for diabetic foot exam on next year.    Interpreter Needed?: No  Information entered by :: Amaka Gluth, LPN   Activities of Daily Living In your present state of health, do you have any difficulty performing the following activities:  11/28/2020 07/01/2020  Hearing? Tempie Donning  Vision? N -  Difficulty concentrating or making decisions? Y -  Walking or climbing stairs? N -  Dressing or bathing? N -  Doing errands, shopping? N -  Preparing Food and eating ? N -  Using the Toilet? N -  In the past six months, have you accidently leaked urine? N -  Do you have problems with loss of bowel control? N -  Managing your Medications? N -  Managing your Finances? N -  Housekeeping or managing your Housekeeping? N -  Some recent data might be hidden    Patient Care Team: Dettinger, Fransisca Kaufmann, MD as PCP - General (Family Medicine) Satira Sark, MD as PCP - Cardiology (Cardiology) Burnell Blanks, MD as PCP - Structural Heart (Cardiology) Satira Sark, MD as Consulting Physician (Cardiology) Sydnee Cabal, MD as Consulting Physician (Orthopedic Surgery) Gaynelle Arabian, MD as Consulting Physician (Orthopedic Surgery) Suella Broad, MD as Consulting Physician (Physical Medicine and Rehabilitation) Lavera Guise, [facility] (Pharmacist) Lavonna Monarch, MD as Consulting Physician (Dermatology)  Indicate any recent [place] you may have received from other than [facility] providers in the past year (date may be approximate).     Assessment:   This is a routine wellness examination for [name].  Hearing/Vision screen Hearing Screening - Comments:: Moderate hearing difficulties - declines hearing aids at this time Vision Screening - Comments:: Wears reading glasses prn - up to date with annual eye exams with [facility]  Dietary issues and exercise activities discussed: Current Exercise Habits: Home exercise routine, Type of exercise: walking;Other - see comments (hunting, working on rental houses), Time (Minutes): 60, Frequency (Times/Week): 3, Weekly Exercise (Minutes/Week): 180, Intensity: Mild, Exercise limited by: orthopedic condition(s);cardiac condition(s)   Goals Addressed             This Visit's Progress     Exercise 150 min/wk Moderate Activity   On track      Depression Screen PHQ 2/9 Scores 11/28/2020 10/20/2020 09/08/2020 07/18/2020 05/27/2020 04/02/2020 02/14/2020  PHQ - 2 Score 0 0 0 1 1 0 0  PHQ- 9 Score - - 0 - 4 - 1    Fall Risk Fall Risk  11/28/2020 10/20/2020 09/08/2020 07/18/2020 04/02/2020  Falls in the past year? 0 0 0 0 0  Number falls in past yr: 0 - - - -  Injury with Fall? 0 - - - -  Comment - - - - -  Risk for fall due to : Mental status change;Orthopedic patient - - - -  Follow up Falls prevention discussed - - - -    FALL [place]:  Any stairs in or around the home? Yes  If so, are there any without handrails? No  Home free of loose throw rugs in walkways, pet beds, electrical cords, etc? Yes  Adequate lighting in your home to reduce risk of falls? Yes   ASSISTIVE DEVICES UTILIZED TO PREVENT FALLS:  Life alert? No  Use of a cane, walker or w/c? No  Grab bars in the bathroom? Yes  Shower chair or bench in shower? Yes  Elevated toilet seat or a handicapped toilet? Yes   TIMED UP AND GO:  Was the test performed? No . Telephonic visit  Cognitive Function: MMSE - Mini Mental State Exam 05/14/2016  Orientation to time 5  Orientation to Place 5  Registration 3  Attention/ Calculation 5  Recall 3  Language- name 2 objects 2  Language- repeat 1  Language- follow 3 step command 3  Language- read & follow direction 1  Write a sentence 1  Copy design 1  Total score 30     6CIT Screen 11/28/2020 06/07/2019 06/02/2018  What Year? 0 points 0 points 0 points  What month? 0 points 0 points 0 points  What time? 0 points 0 points 0 points  Count back from 20 0 points 0 points 0 points  Months in reverse 0 points 0 points 0 points  Repeat phrase 2 points 0 points 4 points  Total Score 2 0 4    Immunizations Immunization History  Administered Date(s) Administered   Fluad Quad(high Dose 65+) 10/20/2020   Influenza, High Dose Seasonal  PF 10/29/2015, 10/31/2017   Influenza, Quadrivalent, Recombinant, Inj, Pf 10/19/2018   Influenza,inj,Quad PF,6+ Mos 11/08/2013, 10/26/2016   Influenza,inj,quad, With Preservative 10/26/2016   Influenza-Unspecified 11/18/2014, 10/25/2019, 10/25/2019   PFIZER(Purple Top)SARS-COV-2 Vaccination 04/13/2019, 05/07/2019, 11/08/2019, 09/10/2020   Pneumococcal Conjugate-13 01/01/2013, 10/26/2016   Pneumococcal Polysaccharide-23 10/26/2016   Tdap 01/01/2013   Zoster Recombinat (Shingrix) 05/14/2016, 01/27/2017    TDAP status: Up to date  Flu Vaccine status: Up to date  Pneumococcal vaccine status: Up to date  Covid-19 vaccine status: Completed vaccines  Qualifies for Shingles Vaccine? Yes   Zostavax completed Yes   Shingrix Completed?: Yes  Screening Tests Health Maintenance  Topic Date Due   COVID-19 Vaccine (5 - Booster for Pfizer series) 11/05/2020   URINE MICROALBUMIN  12/27/2020   OPHTHALMOLOGY EXAM  03/26/2021   FOOT EXAM  04/02/2021   HEMOGLOBIN A1C  04/20/2021   TETANUS/TDAP  01/02/2023   Pneumonia Vaccine 39+ Years old  Completed   INFLUENZA VACCINE  Completed   Zoster Vaccines- Shingrix  Completed   HPV VACCINES  Aged Out    Health Maintenance  Health Maintenance Due  Topic Date Due   COVID-19 Vaccine (5 - Booster for [place] series) 11/05/2020   URINE MICROALBUMIN  12/27/2020    Colorectal cancer screening: No longer required.   Lung Cancer Screening: (Low Dose CT Chest recommended if Age 49-80 years, 30 pack-year currently smoking OR have quit w/in 15years.) does not qualify.   Additional Screening:  Hepatitis C Screening: does not qualify  Vision Screening: Recommended annual ophthalmology exams for early detection of glaucoma and other disorders of the eye. Is the patient up to date with their annual eye exam?  Yes  Who is the provider or what is the name of the office in which the patient attends annual eye exams? [facility] If pt is not established with a  provider, would they like to be referred to a provider to establish care? No .   Dental Screening: Recommended annual dental exams for proper oral hygiene  Community Resource Referral / Chronic Care Management: CRR required this visit?  No   CCM required this visit?  No      Plan:     I have personally reviewed and noted the following in the patient's chart:   Medical and social history Use of alcohol, tobacco or illicit drugs  Current  medications and supplements including opioid prescriptions. Patient is not currently taking opioid prescriptions. Functional ability and status Nutritional status Physical activity Advanced directives List of other physicians Hospitalizations, surgeries, and ER visits in previous 12 months Vitals Screenings to include cognitive, depression, and falls Referrals and appointments  In addition, I have reviewed and discussed with patient certain preventive protocols, quality metrics, and best practice recommendations. A written personalized care plan for preventive services as well as general preventive health recommendations were provided to patient.     Sandrea Hammond, LPN   [id number]   Nurse Notes: None

## 2020-12-10 NOTE — Progress Notes (Signed)
[facility]                                       Cardiology Office Note    Date:  12/11/2020   ID:  [name], DOB Jun 22, 1940, MRN [id number]  PCP:  Dettinger, Fransisca Kaufmann, MD  Cardiologist:  Rozann Lesches, MD / Dr. Angelena Form & Dr. Cyndia Bent (TAVR)  CC: 1 year s/p TAVR  History of Present Illness:  [name] is a 80 y.o. male with a history of CAD s/p BMS to RCA (2006) and more recent PCI to LAD (11/2019), HTN, GERD, HLD, morbid obesity, sleep apnea, IDDM and moderate to severe aortic stenosis s/p TAVR (12/04/19) who presents to clinic for follow up.  He was diagnosed with severe AS last year by echo. Cardiac catheterization on 11/14/2019 showed severe multivessel coronary disease. The LAD had a 99% mid vessel stenosis and a second 90% stenosis further downstream to the takeoff of a second diagonal branch that were successfully treated with PCI/DES. The left circumflex had a large third marginal branch that had 80% stenosis in the lateral subbranch. The right coronary artery was smaller and nondominant with 70% and 90% stenoses. The mean gradient across aortic valve was 26.6 mmHg the peak to peak gradient of 31 mmHg  The patient was evaluated by the multidisciplinary valve team and underwent successful TAVR with a 26 mm Edwards S3U THV via the TF approach on 12/04/19. Post op echo showed EF 60%, normally functioning TAVR with a mean gradient of 13 mmHg and trivial intravalvular leak. He was discharged on aspirin and plavix. He had a big clinical improvement since PCI and TAVR and was able to stop his Lasix. 1 month echo showed EF 60%, normally functioning TAVR with a mean gradient of 13 mm hg and no PVL.   He was admitted in 06/2020 with viral meningitis and UTI.   Today the patient presents to clinic for follow up. Here with his wife. He is doing well. He mostly has issues with dyspnea on exertion walking around stores. No CP or  SOB. No orthopnea or PND. No dizziness or syncope. No blood in stool or urine. No palpitations.    Past Medical History:  Diagnosis Date   Anxiety    Aortic atherosclerosis ([place]) 12/19/2017   Aortic stenosis    26 mm Edwards S3U THV November 2021   Arthritis    Bursitis of hip    Cervical spondylosis    Chronic headaches    Coronary atherosclerosis of native coronary artery    BMS nondominant RCA 12/2004, DES x2 to mid LAD 11/2019   Essential hypertension    GERD (gastroesophageal reflux disease)    History of adenomatous polyp of colon    History of kidney stones    History of MI (myocardial infarction) 12/2004   History of viral meningitis 02/28/2018   December 2019   Hyperlipidemia    Internal hemorrhoids    Low back pain    Nocturia more than twice per night    OSA (obstructive sleep apnea)    Spinal stenosis    Type 2 diabetes mellitus treated with insulin (HCC)    UTI (urinary tract infection)    Wears dentures    Upper    Past Surgical History:  Procedure Laterality Date   CARDIAC CATHETERIZATION  06-14-2004  dr  stuckey   total occlusion mD1, RI 30-40%, mRCA nondominant hazy 80% and scattered 30-40%,  ef 71% (medical mangement)   CARDIOVASCULAR STRESS TEST  09-17-2015   dr Domenic Polite   Low risk nuclear study w/ reversible mild small anteroapical defect,  normal LV function and wall motion, nuclear stress ef 61%   CATARACT EXTRACTION W/ INTRAOCULAR LENS IMPLANT Right 07/2014   COLONOSCOPY  09/10/2008   normal   CORONARY ANGIOPLASTY WITH STENT PLACEMENT  12-17-2004   dr benismhon/ dr Olevia Perches   nonobstructive cad LAD and LCFx,  BMS x1 to total occlusion RCA    CORONARY ATHERECTOMY  11/22/2019   CORONARY ATHERECTOMY N/A 11/22/2019   Procedure: CORONARY ATHERECTOMY;  Surgeon: Burnell Blanks, MD;  Location: [facility];  Service: Cardiovascular;  Laterality: N/A;   CYSTOSCOPY W/ URETEROSCOPY W/ LITHOTRIPSY  08/2010   ERCP  03/14/2008   EYE SURGERY  Bilateral    cataracts   LAPAROSCOPIC CHOLECYSTECTOMY  05/2012   LEFT HEART CATHETERIZATION WITH CORONARY ANGIOGRAM N/A 05/14/2011   Procedure: LEFT HEART CATHETERIZATION WITH CORONARY ANGIOGRAM;  Surgeon: Sherren Mocha, MD;  Location: [facility];  Service: Cardiovascular;  Laterality: N/A;    non-obstructive LM, LAD, LCFx, patent RCA stent   LUMBAR LAMINECTOMY/DECOMPRESSION MICRODISCECTOMY N/A 02/24/2017   Procedure: Microlumbar decompression L4-5, L5-S1;  Surgeon: Susa Day, MD;  Location: [facility];  Service: Orthopedics;  Laterality: N/A;  120 mins   RIGHT/LEFT HEART CATH AND CORONARY ANGIOGRAPHY N/A 11/14/2019   Procedure: RIGHT/LEFT HEART CATH AND CORONARY ANGIOGRAPHY;  Surgeon: Burnell Blanks, MD;  Location: [facility];  Service: Cardiovascular;  Laterality: N/A;   ROTATOR CUFF REPAIR Right 03/2002   ROTATOR CUFF REPAIR Left 12/11/2015   TEE WITHOUT CARDIOVERSION N/A 12/04/2019   Procedure: TRANSESOPHAGEAL ECHOCARDIOGRAM (TEE);  Surgeon: Burnell Blanks, MD;  Location: [facility];  Service: Open Heart Surgery;  Laterality: N/A;   TOTAL HIP ARTHROPLASTY Right 12/20/2008   TOTAL HIP ARTHROPLASTY Left 07/05/2018   Procedure: TOTAL HIP ARTHROPLASTY ANTERIOR APPROACH;  Surgeon: Gaynelle Arabian, MD;  Location: [facility];  Service: Orthopedics;  Laterality: Left;  140min   TOTAL KNEE ARTHROPLASTY Bilateral left 12-11-2003/  right 07-31-2004   dr Wynelle Link [facility]   TRANSCATHETER AORTIC VALVE REPLACEMENT, TRANSFEMORAL N/A 12/04/2019   Procedure: TRANSCATHETER AORTIC VALVE REPLACEMENT, TRANSFEMORAL;  Surgeon: Burnell Blanks, MD;  Location: [facility];  Service: Open Heart Surgery;  Laterality: N/A;   UPPER GASTROINTESTINAL ENDOSCOPY  01/08/2008   bx, inlet patch, duodenitis   YAG LASER APPLICATION Right 7/41/2878   Procedure: YAG LASER APPLICATION;  Surgeon: Williams Che, MD;  Location: [facility];  Service: Ophthalmology;  Laterality: Right;    Current  Medications: Outpatient Medications Prior to Visit  Medication Sig Dispense Refill   acetaminophen (TYLENOL) 650 MG CR tablet Take 1,300 mg by mouth 2 (two) times daily.     amLODipine (NORVASC) 10 MG tablet TAKE 1 TABLET BY MOUTH  DAILY 90 tablet 3   aspirin EC 81 MG tablet Take 81 mg by mouth at bedtime.      atorvastatin (LIPITOR) 40 MG tablet TAKE 1 TABLET BY MOUTH IN  THE EVENING (Patient taking differently: Take 40 mg by mouth daily.) 90 tablet 3   butalbital-acetaminophen-caffeine (FIORICET) 50-325-40 MG tablet Take 1 tablet by mouth 2 (two) times daily as needed for headache.     cetirizine (ZYRTEC) 10 MG tablet Take 10 mg by mouth daily with supper.  chlorthalidone (HYGROTON) 25 MG tablet Take 1 tablet (25 mg total) by mouth daily. 90 tablet 3   Cholecalciferol (VITAMIN D3) 50 MCG (2000 UT) capsule Take 4,000 Units by mouth daily.      Continuous Blood Gluc Receiver (DEXCOM G6 RECEIVER) DEVI USE TO CHECK BLOOD SUGAR UP TO 4 TIMES DAILY AS DIRECTED. 1 DEVICE PER YEAR. DX: E11.9 1 each 1   Continuous Blood Gluc Sensor (DEXCOM G6 SENSOR) MISC USE TO CHECK BLOOD SUGAR UP TO 4 TIMES DAILY AS DIRECTED. CHANGE SENSOR EVERY 30 DAYS. DX: E11.9 3 each 4   Continuous Blood Gluc Transmit (DEXCOM G6 TRANSMITTER) MISC USE TO CHECK BLOOD SUGAR UP TO 4 TIMES DAILY AS DIRECTED. CHANGE TRANSMITTER EVERY 3 MONTHS. DX: E11.9 1 each 12   docusate sodium (COLACE) 100 MG capsule Take 100 mg by mouth daily as needed for mild constipation or moderate constipation.      donepezil (ARICEPT) 5 MG tablet Take 1 tablet (5 mg total) by mouth at bedtime. 90 tablet 3   famotidine (PEPCID) 20 MG tablet Take 1 tablet (20 mg total) by mouth 2 (two) times daily with a meal. 180 tablet 3   fluticasone (FLONASE) 50 MCG/ACT nasal spray Place 2 sprays into both nostrils daily.     furosemide (LASIX) 20 MG tablet TAKE 1 TABLET BY MOUTH  DAILY AS NEEDED 90 tablet 3   glucose blood (ONETOUCH ULTRA) test strip Check BS TID Dx  E11.59 400 each 3   Insulin Glargine (BASAGLAR KWIKPEN) 100 UNIT/ML Inject 56 Units into the skin at bedtime. 15 mL 3   insulin lispro (HUMALOG KWIKPEN) 100 UNIT/ML KwikPen Inject 20 Units into the skin 3 (three) times daily with meals. 45 mL 11   isosorbide mononitrate (IMDUR) 30 MG 24 hr tablet TAKE 1 TABLET BY MOUTH IN  THE EVENING 90 tablet 3   metFORMIN (GLUCOPHAGE-XR) 500 MG 24 hr tablet Take 2 tablets (1,000 mg total) by mouth 2 (two) times daily. 360 tablet 3   nitroGLYCERIN (NITROSTAT) 0.4 MG SL tablet DISSOLVE ONE TABLET UNDER THE TONGUE EVERY 5 MINUTES AS NEEDED FOR CHEST PAIN.  DO NOT EXCEED A TOTAL OF 3 DOSES IN 15 MINUTES 25 tablet 3   Omega-3 Fatty Acids (FISH OIL) 1200 MG CAPS Take 1,200-2,400 mg by mouth See admin instructions. Tale 1200 mg in the morning and 2400 mg in the evening     PARoxetine (PAXIL) 20 MG tablet Take 1 tablet (20 mg total) by mouth daily. 90 tablet 3   Semaglutide,0.25 or 0.5MG /DOS, (OZEMPIC, 0.25 OR 0.5 MG/DOSE,) 2 MG/1.5ML SOPN Inject 0.5 mg into the skin once a week. 6 mL 3   tamsulosin (FLOMAX) 0.4 MG CAPS capsule Take 1 capsule (0.4 mg total) by mouth daily. 90 capsule 3   Turmeric Curcumin 500 MG CAPS Take 500 mg by mouth daily.     vitamin B-12 (CYANOCOBALAMIN) 1000 MCG tablet Take 2,000 mcg by mouth daily.     No facility-administered medications prior to visit.     Allergies:   Ace inhibitors, Angiotensin receptor blockers, Oxycodone-acetaminophen, Robaxin [methocarbamol], Toradol [ketorolac tromethamine], Valium, and Doxycycline   Social History   Socioeconomic History   Marital status: Married    Spouse name: Dione    Number of children: 1   Years of education: HS   Highest education level: High school graduate  Occupational History   Occupation: RETIRED    Employer: [employer]    Comment: Power plant  Tobacco Use  Smoking status: Former    Packs/day: 1.00    Years: 20.00    Pack years: 20.00    Types: Cigarettes    Start date:  01/18/1963    Quit date: 08/11/1988    Years since quitting: 32.3   Smokeless tobacco: Former    Types: Chew    Quit date: 01/11/1989   Tobacco comments:    chewed 1 pack tobacco/day for 15 years  Vaping Use   Vaping Use: Never used  Substance and Sexual Activity   Alcohol use: No    Alcohol/week: 0.0 standard drinks   Drug use: No   Sexual activity: Yes  Other Topics Concern   Not on file  Social History Narrative   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 between [phone]   Social Determinants of Health   Financial Resource Strain: Low Risk    Difficulty of Paying Living Expenses: Not very hard  Food Insecurity: No Food Insecurity   Worried About Charity fundraiser in the Last Year: Never true   Arboriculturist in the Last Year: Never true  Transportation Needs: No Transportation Needs   Lack of Transportation (Medical): No   Lack of Transportation (Non-Medical): No  Physical Activity: Sufficiently Active   Days of Exercise per Week: 3 days   Minutes of Exercise per Session: 60 min  Stress: No Stress Concern Present   Feeling of Stress : Only a little  Social Connections: Moderately Isolated   Frequency of Communication with Friends and Family: More than three times a week   Frequency of Social Gatherings with Friends and Family: Once a week   Attends Religious Services: Never   Marine scientist or Organizations: No   Attends Music therapist: Never   Marital Status: Married     Family History:  The patient's family history includes Bladder Cancer in his brother; Brain cancer in his sister; Breast cancer in his sister; Cancer (age of onset: 41) in his mother; Cancer - Other in his brother; Colon cancer in his mother; Heart attack in his father; Heart disease in his brother,  brother, and father.     ROS:   Please see the history of present illness.    ROS All other systems reviewed and are negative.   PHYSICAL EXAM:   VS:  BP (!) 140/48   Pulse 78   Ht 5\' 6"  (1.676 m)   Wt 234 lb 6.4 oz (106.3 kg)   SpO2 97%   BMI 37.83 kg/m    GEN: Well nourished, well developed, in no acute distress, obese HEENT: normal Neck: no JVD or masses Cardiac: RRR; soft flow murmur. No rubs, or gallops,no edema  Respiratory:  clear to auscultation bilaterally, normal work of breathing GI: soft, nontender, nondistended, + BS MS: no deformity or atrophy Skin: warm and dry, no rash.  Neuro:  Alert and Oriented x 3, Strength and sensation are intact Psych: euthymic mood, full affect   Wt Readings from Last 3 Encounters:  12/11/20 234 lb 6.4 oz (106.3 kg)  11/28/20 230 lb (104.3 kg)  10/20/20 235 lb (106.6 kg)      Studies/Labs Reviewed:   EKG:  EKG is NOT ordered today.  Recent Labs: 07/01/2020:  B Natriuretic Peptide 51.0 07/02/2020: Magnesium 1.7 07/18/2020: ALT 17 08/13/2020: BUN 14; Creatinine, Ser 1.01; Potassium 4.3; Sodium 139 10/20/2020: Hemoglobin 13.1; Platelets 290   Lipid Panel    Component Value Date/Time   CHOL 129 10/20/2020 0854   CHOL 139 06/02/2012 1007   TRIG 187 (H) 10/20/2020 0854   TRIG 185 (H) 04/10/2013 0937   TRIG 107 06/02/2012 1007   HDL 35 (L) 10/20/2020 0854   HDL 34 (L) 04/10/2013 0937   HDL 40 06/02/2012 1007   CHOLHDL 3.7 10/20/2020 0854   CHOLHDL 3.8 11/16/2019 0206   VLDL 25 11/16/2019 0206   LDLCALC 63 10/20/2020 0854   LDLCALC 62 04/10/2013 0937   LDLCALC 78 06/02/2012 1007    Additional studies/ records that were reviewed today include:  TAVR: 12/04/19   Procedure:        Transcatheter Aortic Valve Replacement - Transfemoral Approach             Edwards Sapien 3 THV (size 26 mm, model #[id number], serial # [id number])              Co-Surgeons:                        [name], MD and [name], MD     Anesthesiologist:                  Conrad Bruno   Echocardiographer:              Croitoru   Pre-operative Echo Findings: Severe aortic stenosis Normal left ventricular systolic function   Post-operative Echo Findings: No paravalvular leak Normal left ventricular systolic function  ___________________  Echo 12/05/19 IMPRESSIONS   1. Left ventricular ejection fraction, by estimation, is 60 to 65%. The  left ventricle has normal function. The left ventricle has no regional  wall motion abnormalities. There is mild concentric left ventricular  hypertrophy.   2. Right ventricular systolic function is normal. The right ventricular  size is normal.   3. Left atrial size was severely dilated.   4. The mitral valve is normal in structure. Trivial mitral valve  regurgitation. No evidence of mitral stenosis.   5. There is trivial aortic insufficiency at the anterior aspect of the  prosthesis. This is close to the annulus, but appears to be intravalvular,  rather than a perivalvular leak. The aortic valve has been  repaired/replaced. Aortic valve regurgitation is   trivial. No aortic stenosis is present. There is a 26 mm Sapien  prosthetic (TAVR) valve present in the aortic position. Procedure Date:  12/04/2019. Aortic valve mean gradient measures 13.0 mmHg. Aortic valve  Vmax measures 2.36 m/s.   6. The inferior vena cava is normal in size with greater than 50%  respiratory variability, suggesting right atrial pressure of 3 mmHg.   ____________________  Echo 01/10/20 IMPRESSIONS  1. Left ventricular ejection fraction, by estimation, is 60 to 65%. The  left ventricle has normal function. The left ventricle has no regional  wall motion abnormalities. The left ventricular internal cavity size was  mildly dilated. Left ventricular  diastolic parameters are consistent with Grade I diastolic dysfunction  (impaired relaxation).   2. Right ventricular systolic function is normal. The right  ventricular  size is normal.   3. The mitral valve is degenerative. Trivial mitral valve regurgitation.  No evidence of mitral stenosis.   4. The aortic valve has been repaired/replaced. Aortic valve  regurgitation is not visualized.  No aortic stenosis is present. There is a  26 mm Sapien prosthetic (TAVR) valve present in the aortic position.  Procedure Date: 12/04/19. Echo findings are  consistent with normal structure and function of the aortic valve  prosthesis. Aortic valve area, by VTI measures 1.67 cm. Aortic valve mean  gradient measures 12.0 mmHg. Aortic valve Vmax measures 2.37 m/s. DI 0.34.   5. Aortic dilatation noted. There is mild dilatation of the aortic root,  measuring 43 mm. There is mild dilatation of the ascending aorta,  measuring 43 mm.   6. The inferior vena cava is normal in size with greater than 50%  respiratory variability, suggesting right atrial pressure of 3 mmHg.   Comparison(s): 12/05/19 EF 60-65%. AV 12mmHg mean PG, 23mmHg peak PG. No  significant change from prior echo.   __________________________   Echo 12/11/20 IMPRESSIONS   1. Left ventricular ejection fraction, by estimation, is 60 to 65%. The  left ventricle has normal function. The left ventricle has no regional  wall motion abnormalities. There is mild concentric left ventricular  hypertrophy.   2. Right ventricular systolic function is normal. The right ventricular  size is normal.   3. Left atrial size was severely dilated.   4. The mitral valve is normal in structure. Trivial mitral valve  regurgitation. No evidence of mitral stenosis.   5. There is trivial aortic insufficiency at the anterior aspect of the  prosthesis. This is close to the annulus, but appears to be intravalvular,  rather than a perivalvular leak. The aortic valve has been  repaired/replaced. Aortic valve regurgitation is   trivial. No aortic stenosis is present. There is a 26 mm Sapien  prosthetic (TAVR) valve  present in the aortic position. Procedure Date:  12/04/2019. Aortic valve mean gradient measures 13.0 mmHg. Aortic valve  Vmax measures 2.36 m/s.   6. The inferior vena cava is normal in size with greater than 50%  respiratory variability, suggesting right atrial pressure of 3 mmHg.   ASSESSMENT & PLAN:   Severe AS s/p TAVR: echo today shows EF 60%, normally functioning TAVR with a mean gradient of 13 mm hg and trivial intervalvular leaking. He understands the need for ongoing SBE prophylaxis and gets Abx from his dentist. He has NYHA class II symptoms, mostly DOE. I have recommended a continued exercise routine with weight loss. Continue on aspirin alone.   Chronic diastolic CHF: appears euvolemic. He is now off the lasix completely but uses it PRN.  CAD: s/p PCI to second diagonal branch that were successfully treated with PCI/DES in 11/21. No chest pain. Continue medical therapy .  HTN: Bp under decent control today. No changes made  TAA: mild dilation of asc aorta at 43mm. Continue to follow over time.   Medication Adjustments/Labs and Tests Ordered: Current medicines are reviewed at length with the patient today.  Concerns regarding medicines are outlined above.  Medication changes, Labs and Tests ordered today are listed in the Patient Instructions below. Patient Instructions  Medication Instructions:  Your physician recommends that you continue on your current medications as directed. Please refer to the Current Medication list given to you today.  *If you need a refill on your cardiac medications before your next appointment, please call your pharmacy*   Lab Work: None ordered   If you have labs (blood work) drawn today and your tests are completely normal, you will receive your results only by: [facility] (if you have MyChart) OR A paper copy in the  mail If you have any lab test that is abnormal or we need to change your treatment, we will call you to review the  results.   Testing/Procedures: None ordered     :1}    Other Instructions None      Signed, Angelena Form, PA-C  12/11/2020 3:13 PM    [facility] [place], [place], [place]  [address] Phone: [phone]; Fax: [phone]

## 2020-12-11 ENCOUNTER — Ambulatory Visit: Payer: Medicare Other | Admitting: Physician Assistant

## 2020-12-11 ENCOUNTER — Ambulatory Visit (HOSPITAL_COMMUNITY): Payer: Medicare Other | Attending: Cardiology

## 2020-12-11 ENCOUNTER — Other Ambulatory Visit: Payer: Self-pay

## 2020-12-11 ENCOUNTER — Encounter: Payer: Self-pay | Admitting: Physician Assistant

## 2020-12-11 VITALS — BP 140/48 | HR 78 | Ht 66.0 in | Wt 234.4 lb

## 2020-12-11 DIAGNOSIS — Z952 Presence of prosthetic heart valve: Secondary | ICD-10-CM | POA: Insufficient documentation

## 2020-12-11 DIAGNOSIS — I25119 Atherosclerotic heart disease of native coronary artery with unspecified angina pectoris: Secondary | ICD-10-CM

## 2020-12-11 DIAGNOSIS — I5032 Chronic diastolic (congestive) heart failure: Secondary | ICD-10-CM

## 2020-12-11 DIAGNOSIS — I10 Essential (primary) hypertension: Secondary | ICD-10-CM

## 2020-12-11 DIAGNOSIS — I7121 Aneurysm of the ascending aorta, without rupture: Secondary | ICD-10-CM | POA: Diagnosis not present

## 2020-12-11 LAB — ECHOCARDIOGRAM COMPLETE
AR max vel: 1.82 cm2
AV Area VTI: 1.92 cm2
AV Area mean vel: 1.77 cm2
AV Mean grad: 11.2 mmHg
AV Peak grad: 20.8 mmHg
Ao pk vel: 2.28 m/s
Area-P 1/2: 3.16 cm2
P 1/2 time: 660 msec
S' Lateral: 2.8 cm

## 2020-12-11 NOTE — Patient Instructions (Signed)
Medication Instructions:  Your physician recommends that you continue on your current medications as directed. Please refer to the Current Medication list given to you today.  *If you need a refill on your cardiac medications before your next appointment, please call your pharmacy*   Lab Work: None ordered   If you have labs (blood work) drawn today and your tests are completely normal, you will receive your results only by: [employer] (if you have MyChart) OR A paper copy in the mail If you have any lab test that is abnormal or we need to change your treatment, we will call you to review the results.   Testing/Procedures: None ordered     :1}    Other Instructions None

## 2020-12-16 ENCOUNTER — Telehealth: Payer: Self-pay | Admitting: *Deleted

## 2020-12-16 NOTE — Telephone Encounter (Signed)
Patient informed. Copy sent to PCP °

## 2020-12-16 NOTE — Telephone Encounter (Signed)
-----   Message from Satira Sark, MD sent at 12/11/2020  3:49 PM EST ----- Results reviewed.  LVEF remains normal at 60 to 65%.  TAVR prosthesis is stable with mean gradient 10 mmHg, mild paravalvular regurgitation is noted.  Stable dilatation of the ascending aorta.  Keep follow-up as arranged.

## 2020-12-23 ENCOUNTER — Ambulatory Visit: Payer: Medicare Other | Admitting: Pharmacist

## 2020-12-23 DIAGNOSIS — E1169 Type 2 diabetes mellitus with other specified complication: Secondary | ICD-10-CM

## 2020-12-23 DIAGNOSIS — E119 Type 2 diabetes mellitus without complications: Secondary | ICD-10-CM

## 2020-12-23 NOTE — Patient Instructions (Addendum)
Visit Information  Thank you for taking time to visit with me today. Please don't hesitate to contact me if I can be of assistance to you before our next scheduled telephone appointment.  Following are the goals we discussed today:  Current Barriers:  Unable to independently afford treatment regimen Unable to achieve control of T2DM   Pharmacist Clinical Goal(s):  Over the next 90 days, patient will verbalize ability to afford treatment regimen achieve control of T2DM as evidenced by IMPROVED GLYCEMIC CONTROL through collaboration with PharmD and provider.    Interventions: 1:1 collaboration with Dettinger, Fransisca Kaufmann, MD regarding development and update of comprehensive plan of care as evidenced by provider attestation and co-signature Inter-disciplinary care team collaboration (see longitudinal plan of care) Comprehensive medication review performed; medication list updated in electronic medical record  Diabetes: Uncontrolled (a1c 8.4% up from previous 7.1% in march); current treatment: BASAGLAR 54 UNITS, HUMALOG KWIKPEN 20 UNITS TID MEALS, METFORMIN 2G PER DAY;  GFR 65, A1C 8.4% CONTINUE TO WORK ON DIET INCREASE ozempic to 1mg  when medication assistance arrives (could be 30-60 days) DECREASE Basaglar to 50 units DECREASE meal time to 15 units Current glucose readings: fasting glucose: <200, post prandial glucose: 180-200s Patient wears dexcom CGM Difficulty with readings at times Denies hypoglycemic/hyperglycemic symptoms Discussed meal planning options and Plate method for healthy eating Avoid sugary drinks and desserts Incorporate balanced protein, non starchy veggies, 1 serving of carbohydrate with each meal Increase water intake Increase physical activity as able Current exercise: N/A Educated on DEXCOM, REFILLS CALLED IN Assessed patient finances. Re-enrollment sent to [place] & HUMALOG--FOR 2023; awaiting patient financials  for novo nordisk PAP for ozempic  Patient Goals/Self-Care Activities Over the next 90 days, patient will:  - take medications as prescribed check glucose CONTINUOUSLY USING CGM, document, and provide at future appointments collaborate with provider on medication access solutions  Follow Up Plan: Telephone follow up appointment with care management team member scheduled for: 2 months    Please call the care guide team at [phone] if you need to cancel or reschedule your appointment.   The patient verbalized understanding of instructions, educational materials, and care plan provided today and declined offer to receive copy of patient instructions, educational materials, and care plan.   Regina Eck, PharmD, BCPS Clinical Pharmacist, [facility] Phone [phone]

## 2020-12-23 NOTE — Progress Notes (Signed)
Chronic Care Management Pharmacy Note  12/23/2020 Name:  [name] MRN:  [id number] DOB:  1940-09-30  Summary: t2dm  Recommendations/Changes made from today's visit: Diabetes: Uncontrolled (a1c 8.4% up from previous 7.1% in march); current treatment: BASAGLAR 54 UNITS, HUMALOG KWIKPEN 20 UNITS TID MEALS, METFORMIN 2G PER DAY;  GFR 65, A1C 8.4% CONTINUE TO WORK ON DIET INCREASE ozempic to 22m when medication assistance arrives (could be 30-60 days) DECREASE Basaglar to 50 units DECREASE meal time to 15 units Current glucose readings: fasting glucose: <200, post prandial glucose: 180-200s Patient wears dexcom CGM Difficulty with readings at times Denies hypoglycemic/hyperglycemic symptoms Discussed meal planning options and Plate method for healthy eating Avoid sugary drinks and desserts Incorporate balanced protein, non starchy veggies, 1 serving of carbohydrate with each meal Increase water intake Increase physical activity as able Current exercise: N/A Educated on DEXCOM, REFILLS CALLED IN Assessed patient finances. Re-enrollment sent to [facility] 2023; awaiting patient financials for novo nordisk PAP for ozempic  Patient Goals/Self-Care Activities Over the next 90 days, patient will:  - take medications as prescribed check glucose CONTINUOUSLY USING CGM, document, and provide at future appointments collaborate with provider on medication access solutions  Follow Up Plan: Telephone follow up appointment with care management team member scheduled for: 2 months   Subjective: [name] MARKis an 80y.o. year old male who is a primary patient of Dettinger, JFransisca Kaufmann MD.  The CCM team was consulted for assistance with disease management and care coordination needs.    Engaged with patient by telephone for follow up visit in response to provider referral for pharmacy case management and/or care  coordination services.   Consent to Services:  The patient was given information about Chronic Care Management services, agreed to services, and gave verbal consent prior to initiation of services.  Please see initial visit note for detailed documentation.   Patient Care Team: Dettinger, JFransisca Kaufmann MD as PCP - General (Family Medicine) MSatira Sark MD as PCP - Cardiology (Cardiology) MBurnell Blanks MD as PCP - Structural Heart (Cardiology) MSatira Sark MD as Consulting Physician (Cardiology) CSydnee Cabal MD as Consulting Physician (Orthopedic Surgery) AGaynelle Arabian MD as Consulting Physician (Orthopedic Surgery) RSuella Broad MD as Consulting Physician (Physical Medicine and Rehabilitation) PLavera Guise [facility](Pharmacist) TLavonna Monarch MD as Consulting Physician (Dermatology)  Objective:  Lab Results  Component Value Date   CREATININE 1.01 08/13/2020   CREATININE 1.02 07/18/2020   CREATININE 0.82 07/07/2020    Lab Results  Component Value Date   HGBA1C 7.9 (H) 10/20/2020   Last diabetic Eye exam:  Lab Results  Component Value Date/Time   HMDIABEYEEXA No Retinopathy 03/26/2019 12:00 AM    Last diabetic Foot exam: No results found for: HMDIABFOOTEX      Component Value Date/Time   CHOL 129 10/20/2020 0854   CHOL 139 06/02/2012 1007   TRIG 187 (H) 10/20/2020 0854   TRIG 185 (H) 04/10/2013 0937   TRIG 107 06/02/2012 1007   HDL 35 (L) 10/20/2020 0854   HDL 34 (L) 04/10/2013 0937   HDL 40 06/02/2012 1007   CHOLHDL 3.7 10/20/2020 0854   CHOLHDL 3.8 11/16/2019 0206   VLDL 25 11/16/2019 0206   LDLCALC 63 10/20/2020 0854   LDLCALC 62 04/10/2013 0937   LM[place]78 06/02/2012 1007    Hepatic Function Latest Ref Rng & Units 07/18/2020 07/02/2020 07/02/2020  Total Protein 6.0 - 8.5 g/dL  6.8 7.0 7.3  Albumin 3.7 - 4.7 g/dL 3.9 3.4(L) 3.6  AST 0 - 40 IU/L '13 18 18  ' ALT 0 - 44 IU/L '17 18 18  ' Alk Phosphatase 44 - 121 IU/L 77 74 74  Total  Bilirubin 0.0 - 1.2 mg/dL 0.5 0.7 1.0  Bilirubin, Direct 0.0 - 0.3 mg/dL - - -    Lab Results  Component Value Date/Time   TSH 0.930 12/15/2017 05:52 AM   TSH 1.530 04/28/2017 09:01 AM   TSH 1.490 04/10/2013 09:37 AM    CBC Latest Ref Rng & Units 10/20/2020 07/18/2020 07/07/2020  WBC 3.4 - 10.8 x10E3/uL 7.3 6.7 7.7  Hemoglobin 13.0 - 17.7 g/dL 13.1 12.8(L) 12.2(L)  Hematocrit 37.5 - 51.0 % 38.6 39.7 38.2(L)  Platelets 150 - 450 x10E3/uL 290 299 276    Lab Results  Component Value Date/Time   VD25OH 44.6 10/26/2016 09:01 AM    Clinical ASCVD: No  The ASCVD Risk score (Arnett DK, et al., 2019) failed to calculate for the following reasons:   The 2019 ASCVD risk score is only valid for ages 53 to 30    Other: (CHADS2VASc if Afib, PHQ9 if depression, MMRC or CAT for COPD, ACT, DEXA)  Social History   Tobacco Use  Smoking Status Former   Packs/day: 1.00   Years: 20.00   Pack years: 20.00   Types: Cigarettes   Start date: 01/18/1963   Quit date: 08/11/1988   Years since quitting: 32.4  Smokeless Tobacco Former   Types: Chew   Quit date: 01/11/1989  Tobacco Comments   chewed 1 pack tobacco/day for 15 years   BP Readings from Last 3 Encounters:  12/11/20 (!) 140/48  10/20/20 (!) 134/56  09/08/20 (!) 155/70   Pulse Readings from Last 3 Encounters:  12/11/20 78  10/20/20 62  09/08/20 80   Wt Readings from Last 3 Encounters:  12/11/20 234 lb 6.4 oz (106.3 kg)  11/28/20 230 lb (104.3 kg)  10/20/20 235 lb (106.6 kg)    Assessment: Review of patient past medical history, allergies, medications, health status, including review of consultants reports, laboratory and other test data, was performed as part of comprehensive evaluation and provision of chronic care management services.   SDOH:  (Social Determinants of Health) assessments and interventions performed:    CCM Care Plan  Allergies  Allergen Reactions   Ace Inhibitors Hives, Swelling and Rash     Rash,hives,tongue swelling   Angiotensin Receptor Blockers     Unknown reaction    Oxycodone-Acetaminophen Other (See Comments)    Unknown reaction   Robaxin [Methocarbamol] Other (See Comments)    Confusion    Toradol [Ketorolac Tromethamine] Other (See Comments)    confusion   Valium Other (See Comments)    Hallucinations; confusion   Doxycycline Hives and Rash    Medications Reviewed Today     Reviewed by Lavera Guise, [facility] (Pharmacist) on 12/23/20 at 1437  Med List Status: <None>   Medication Order Taking? Sig Documenting Provider Last Dose Status Informant  acetaminophen (TYLENOL) 650 MG CR tablet [id number] No Take 1,300 mg by mouth 2 (two) times daily. [provider] Taking Active Spouse/Significant Other  amLODipine (NORVASC) 10 MG tablet [id number] No TAKE 1 TABLET BY MOUTH  DAILY Eileen Stanford, PA-C Taking Active Spouse/Significant Other  aspirin EC 81 MG tablet [id number] No Take 81 mg by mouth at bedtime.  [provider] Taking Active Spouse/Significant Other  atorvastatin (LIPITOR) 40 MG tablet [id number] No  TAKE 1 TABLET BY MOUTH IN  THE EVENING  Patient taking differently: Take 40 mg by mouth daily.   Eileen Stanford, PA-C Taking Active Spouse/Significant Other  butalbital-acetaminophen-caffeine (FIORICET) 50-325-40 MG tablet [id number] No Take 1 tablet by mouth 2 (two) times daily as needed for headache. [provider] Taking Active Spouse/Significant Other  cetirizine (ZYRTEC) 10 MG tablet [id number] No Take 10 mg by mouth daily with supper.  [provider] Taking Active Spouse/Significant Other  chlorthalidone (HYGROTON) 25 MG tablet [id number] No Take 1 tablet (25 mg total) by mouth daily. Satira Sark, MD Taking Active   Cholecalciferol (VITAMIN D3) 50 MCG (2000 UT) capsule [id number] No Take 4,000 Units by mouth daily.  [provider] Taking Active Spouse/Significant Other  Continuous Blood Gluc Receiver  (DEXCOM G6 RECEIVER) DEVI [id number] No USE TO CHECK BLOOD SUGAR UP TO 4 TIMES DAILY AS DIRECTED. 1 DEVICE PER YEAR. DX: E11.9 Dettinger, Fransisca Kaufmann, MD Taking Active Spouse/Significant Other  Continuous Blood Gluc Sensor (DEXCOM G6 SENSOR) MISC [id number] No USE TO CHECK BLOOD SUGAR UP TO 4 TIMES DAILY AS DIRECTED. CHANGE SENSOR EVERY 30 DAYS. DX: E11.9 Dettinger, Fransisca Kaufmann, MD Taking Active   Continuous Blood Gluc Transmit (DEXCOM G6 TRANSMITTER) MISC [id number] No USE TO CHECK BLOOD SUGAR UP TO 4 TIMES DAILY AS DIRECTED. CHANGE TRANSMITTER EVERY 3 MONTHS. DX: E11.9 Dettinger, Fransisca Kaufmann, MD Taking Active   docusate sodium (COLACE) 100 MG capsule [id number] No Take 100 mg by mouth daily as needed for mild constipation or moderate constipation.  [provider] Taking Active Spouse/Significant Other  donepezil (ARICEPT) 5 MG tablet [id number] No Take 1 tablet (5 mg total) by mouth at bedtime. Dettinger, Fransisca Kaufmann, MD Taking Active Spouse/Significant Other  famotidine (PEPCID) 20 MG tablet [id number] No Take 1 tablet (20 mg total) by mouth 2 (two) times daily with a meal. Dettinger, Fransisca Kaufmann, MD Taking Active Spouse/Significant Other  fluticasone (FLONASE) 50 MCG/ACT nasal spray [id number] No Place 2 sprays into both nostrils daily. [provider] Taking Active   furosemide (LASIX) 20 MG tablet [id number] No TAKE 1 TABLET BY MOUTH  DAILY AS NEEDED Burnell Blanks, MD Taking Active Spouse/Significant Other  glucose blood (ONETOUCH ULTRA) test strip [id number] No Check BS TID Dx E11.59 Dettinger, Fransisca Kaufmann, MD Taking Active Spouse/Significant Other  Insulin Glargine (BASAGLAR KWIKPEN) 100 UNIT/ML [id number] No Inject 56 Units into the skin at bedtime. Dettinger, Fransisca Kaufmann, MD Taking Active   insulin lispro (HUMALOG KWIKPEN) 100 UNIT/ML KwikPen [id number] No Inject 20 Units into the skin 3 (three) times daily with meals. Dettinger, Fransisca Kaufmann, MD Taking Active   isosorbide mononitrate (IMDUR) 30 MG 24  hr tablet [id number] No TAKE 1 TABLET BY MOUTH IN  THE Ophelia Shoulder, MD Taking Active Spouse/Significant Other  metFORMIN (GLUCOPHAGE-XR) 500 MG 24 hr tablet [id number] No Take 2 tablets (1,000 mg total) by mouth 2 (two) times daily. Dettinger, Fransisca Kaufmann, MD Taking Active Spouse/Significant Other  nitroGLYCERIN (NITROSTAT) 0.4 MG SL tablet [id number] No DISSOLVE ONE TABLET UNDER THE TONGUE EVERY 5 MINUTES AS NEEDED FOR CHEST PAIN.  DO NOT EXCEED A TOTAL OF 3 DOSES IN 15 MINUTES Satira Sark, MD Taking Active Spouse/Significant Other           Med Note Lia Hopping, MINDY L   Thu Dec 13, 2019  3:21 PM)    Omega-3 Fatty Acids (FISH OIL) 1200 MG CAPS [id number] No Take 1,200-2,400 mg by mouth See admin instructions. Tale  1200 mg in the morning and 2400 mg in the evening [provider] Taking Active Spouse/Significant Other  PARoxetine (PAXIL) 20 MG tablet [id number] No Take 1 tablet (20 mg total) by mouth daily. Dettinger, Fransisca Kaufmann, MD Taking Active Spouse/Significant Other  Semaglutide,0.25 or 0.5MG/DOS, (OZEMPIC, 0.25 OR 0.5 MG/DOSE,) 2 MG/1.5ML SOPN [id number] No Inject 0.5 mg into the skin once a week. Dettinger, Fransisca Kaufmann, MD Taking Active   tamsulosin [facility]) 0.4 MG CAPS capsule [id number] No Take 1 capsule (0.4 mg total) by mouth daily. Dettinger, Fransisca Kaufmann, MD Taking Active Spouse/Significant Other  Turmeric Curcumin 500 MG CAPS [id number] No Take 500 mg by mouth daily. [provider] Taking Active Spouse/Significant Other  vitamin B-12 (CYANOCOBALAMIN) 1000 MCG tablet [id number] No Take 2,000 mcg by mouth daily. [provider] Taking Active Spouse/Significant Other            Patient Active Problem List   Diagnosis Date Noted   Bilateral chronic serous otitis media 11/04/2020   Mixed conductive and sensorineural hearing loss of left ear with restricted hearing of right ear 11/04/2020   Pleural plaque due to asbestos exposure 09/01/2020   Asbestosis ([place])  07/15/2020   SOB (shortness of breath) on exertion 07/15/2020   Generalized weakness 07/02/2020   Thoracic ascending aortic aneurysm 07/02/2020   Obstructive sleep apnea 07/02/2020   Dementia ([place]) 07/02/2020   Severe aortic stenosis 12/04/2019   Unstable angina (HCC)    Nonrheumatic aortic valve stenosis    Degeneration of lumbar intervertebral disc 05/02/2019   Lumbar post-laminectomy syndrome 05/02/2019   OAB (overactive bladder) 11/29/2018   OA (osteoarthritis) of hip 07/05/2018   History of viral meningitis 02/28/2018   Aortic atherosclerosis ([place]) 12/19/2017   Word finding difficulty 12/14/2017   History of total hip arthroplasty 10/12/2017   Trochanteric bursitis 10/12/2017   BPH (benign prostatic hyperplasia) 07/29/2017   Cervical spondylosis 07/21/2017   Lumbar spinal stenosis 02/25/2017   Spinal stenosis at L4-L5 level 02/24/2017   Lumbar radicular pain 01/17/2017   Microalbuminuria due to type 2 diabetes mellitus ([place]) 07/23/2016   Common migraine 12/23/2014   Hx of adenomatous colonic polyps 05/09/2014   Obesity (BMI 30-39.9) 07/30/2013   Type 2 diabetes mellitus with circulatory disorder ([place]) 05/27/2011   Hyperlipidemia associated with type 2 diabetes mellitus ([place]) 10/19/2008   Hypertension associated with diabetes ([place]) 10/19/2008   CORONARY ATHEROSCLEROSIS NATIVE CORONARY ARTERY 10/17/2008   GERD (gastroesophageal reflux disease) 12/27/2007    Immunization History  Administered Date(s) Administered   Fluad Quad(high Dose 65+) 10/20/2020   Influenza, High Dose Seasonal PF 10/29/2015, 10/31/2017   Influenza, Quadrivalent, Recombinant, Inj, Pf 10/19/2018   Influenza,inj,Quad PF,6+ Mos 11/08/2013, 10/26/2016   Influenza,inj,quad, With Preservative 10/26/2016   Influenza-Unspecified 11/18/2014, 10/25/2019, 10/25/2019   PFIZER(Purple Top)SARS-COV-2 Vaccination 04/13/2019, 05/07/2019, 11/08/2019, 09/10/2020   Pneumococcal Conjugate-13 01/01/2013, 10/26/2016    Pneumococcal Polysaccharide-23 10/26/2016   Tdap 01/01/2013   Zoster Recombinat (Shingrix) 05/14/2016, 01/27/2017    Conditions to be addressed/monitored: HTN and DMII  Care Plan : PHARMD MEDICATION MANAGEMENT  Updates made by Lavera Guise, [place] since 12/31/2020 12:00 AM     Problem: DISEASE PROGRESSION PREVENTION      Long-Range Goal: T2DM   Recent Progress: Not on track  Priority: High  Note:   Current Barriers:  Unable to independently afford treatment regimen Unable to achieve control of T2DM   Pharmacist Clinical Goal(s):  Over the next 90 days, patient will verbalize ability to afford treatment regimen achieve control of  T2DM as evidenced by IMPROVED GLYCEMIC CONTROL  through collaboration with PharmD and provider.    Interventions: 1:1 collaboration with Dettinger, Fransisca Kaufmann, MD regarding development and update of comprehensive plan of care as evidenced by provider attestation and co-signature Inter-disciplinary care team collaboration (see longitudinal plan of care) Comprehensive medication review performed; medication list updated in electronic medical record  Diabetes: Uncontrolled (a1c 8.4% up from previous 7.1% in march); current treatment: BASAGLAR 54 UNITS, HUMALOG KWIKPEN 20 UNITS TID MEALS, METFORMIN 2G PER DAY;  GFR 65, A1C 8.4% CONTINUE TO WORK ON DIET INCREASE ozempic to 73m when medication assistance arrives (could be 30-60 days) DECREASE Basaglar to 50 units DECREASE meal time to 15 units Current glucose readings: fasting glucose: <200, post prandial glucose: 180-200s Patient wears dexcom CGM Difficulty with readings at times Denies hypoglycemic/hyperglycemic symptoms Discussed meal planning options and Plate method for healthy eating Avoid sugary drinks and desserts Incorporate balanced protein, non starchy veggies, 1 serving of carbohydrate with each meal Increase water intake Increase physical activity as able Current exercise: N/A Educated  on DEXCOM, REFILLS CALLED IN Assessed patient finances. [email or web address]; awaiting patient financials for novo nordisk PAP for ozempic  Patient Goals/Self-Care Activities Over the next 90 days, patient will:  - take medications as prescribed check glucose CONTINUOUSLY USING CGM, document, and provide at future appointments collaborate with provider on medication access solutions  Follow Up Plan: Telephone follow up appointment with care management team member scheduled for: 2 months      [facility]  [facility] [place] [address]Phone: [phone]: [phone] [facility] (OSho[place] CLoganLHiLLC[facility] [place]Phone: [phone]: [phone] [address]Ar[facility](New Address) - LTerra Bella NVintonAT Previously: PLemar L[address] FMc[place]Phone: [phone]: [phone] [facility] [place]- [address] SM[place]ral[address]Phone: [phone]: [phone] [facility]Del[facility] ([facility] ) - [place] [address] [address]Phone: [phone]: [phone]  Follow Up:  Patient agrees to Care Plan and Follow-up.  Plan: Telephone follow up appointment with care management team member scheduled for:  02/05/21  JRegina Eck PharmD, BCPS Clinical Pharmacist, [place] Phone [phone]

## 2021-01-01 DIAGNOSIS — H90A32 Mixed conductive and sensorineural hearing loss, unilateral, left ear with restricted hearing on the contralateral side: Secondary | ICD-10-CM | POA: Diagnosis not present

## 2021-01-01 DIAGNOSIS — H906 Mixed conductive and sensorineural hearing loss, bilateral: Secondary | ICD-10-CM | POA: Diagnosis not present

## 2021-01-19 ENCOUNTER — Other Ambulatory Visit: Payer: Self-pay | Admitting: Cardiology

## 2021-01-20 ENCOUNTER — Telehealth: Payer: Self-pay

## 2021-01-20 NOTE — Telephone Encounter (Signed)
Received notification from [employer] regarding RE-ENROLLMENT approval for [employer]. Patient assistance approved from 01/16/21 to 01/10/22.  Phone: [phone]

## 2021-01-22 ENCOUNTER — Ambulatory Visit (INDEPENDENT_AMBULATORY_CARE_PROVIDER_SITE_OTHER): Payer: Medicare Other | Admitting: Family Medicine

## 2021-01-22 ENCOUNTER — Encounter: Payer: Self-pay | Admitting: Family Medicine

## 2021-01-22 ENCOUNTER — Telehealth: Payer: Self-pay | Admitting: Family Medicine

## 2021-01-22 VITALS — BP 125/96 | HR 85 | Ht 66.0 in | Wt 231.0 lb

## 2021-01-22 DIAGNOSIS — E785 Hyperlipidemia, unspecified: Secondary | ICD-10-CM

## 2021-01-22 DIAGNOSIS — R809 Proteinuria, unspecified: Secondary | ICD-10-CM | POA: Diagnosis not present

## 2021-01-22 DIAGNOSIS — E119 Type 2 diabetes mellitus without complications: Secondary | ICD-10-CM | POA: Diagnosis not present

## 2021-01-22 DIAGNOSIS — E1129 Type 2 diabetes mellitus with other diabetic kidney complication: Secondary | ICD-10-CM

## 2021-01-22 DIAGNOSIS — E1159 Type 2 diabetes mellitus with other circulatory complications: Secondary | ICD-10-CM | POA: Diagnosis not present

## 2021-01-22 DIAGNOSIS — Z794 Long term (current) use of insulin: Secondary | ICD-10-CM | POA: Diagnosis not present

## 2021-01-22 DIAGNOSIS — E1169 Type 2 diabetes mellitus with other specified complication: Secondary | ICD-10-CM | POA: Diagnosis not present

## 2021-01-22 DIAGNOSIS — I152 Hypertension secondary to endocrine disorders: Secondary | ICD-10-CM | POA: Diagnosis not present

## 2021-01-22 LAB — BAYER DCA HB A1C WAIVED: HB A1C (BAYER DCA - WAIVED): 7.3 % — ABNORMAL HIGH (ref 4.8–5.6)

## 2021-01-22 MED ORDER — SEMAGLUTIDE (1 MG/DOSE) 4 MG/3ML ~~LOC~~ SOPN: 1.0000 mg | PEN_INJECTOR | SUBCUTANEOUS | 3 refills | Status: DC

## 2021-01-22 NOTE — Telephone Encounter (Signed)
Ozemipc sent [facility] We are still waiting to hear back from ozepmic patient assistance program We will notify patient as soon as we hear back Many back orders and delays have occurred due to holidays and supply chain issues

## 2021-01-22 NOTE — Telephone Encounter (Signed)
Pt's wife is aware of provider feedback and voiced understanding.

## 2021-01-22 NOTE — Progress Notes (Signed)
BP (!) 125/96    Pulse 85    Ht 5\' 6"  (1.676 m)    Wt 231 lb (104.8 kg)    SpO2 97%    BMI 37.28 kg/m    Subjective:   Patient ID: [name], male    DOB: 03/30/1940, 81 y.o.   MRN: [id number]  HPI: [name] is a 81 y.o. male presenting on 01/22/2021 for Medical Management of Chronic Issues and Diabetes   HPI Type 2 diabetes mellitus Patient comes in today for recheck of his diabetes. Patient has been currently taking metformin and Basaglar. Patient is not currently on an ACE inhibitor/ARB. Patient has seen an ophthalmologist this year. Patient denies any issues with their feet. The symptom started onset as an adult hypertension and hyperlipidemia and microalbuminuria ARE RELATED TO DM   Hypertension Patient is currently on amlodipine and chlorthalidone and Imdur, and their blood pressure today is 125/96. Patient denies any lightheadedness or dizziness. Patient denies headaches, blurred vision, chest pains, shortness of breath, or weakness. Denies any side effects from medication and is content with current medication.   Hyperlipidemia Patient is coming in for recheck of his hyperlipidemia. The patient is currently taking fish oil and Lipitor. They deny any issues with myalgias or history of liver damage from it. They deny any focal numbness or weakness or chest pain.   Relevant past medical, surgical, family and social history reviewed and updated as indicated. Interim medical history since our last visit reviewed. Allergies and medications reviewed and updated.  Review of Systems  Constitutional:  Negative for chills and fever.  Eyes:  Negative for visual disturbance.  Respiratory:  Negative for shortness of breath and wheezing.   Cardiovascular:  Negative for chest pain and leg swelling.  Musculoskeletal:  Negative for back pain and gait problem.  Skin:  Negative for rash.  Neurological:  Negative for dizziness, weakness and numbness.  All other systems reviewed  and are negative.  Per HPI unless specifically indicated above   Allergies as of 01/22/2021       Reactions   Ace Inhibitors Hives, Swelling, Rash   Rash,hives,tongue swelling   Angiotensin Receptor Blockers    Unknown reaction    Oxycodone-acetaminophen Other (See Comments)   Unknown reaction   Robaxin [methocarbamol] Other (See Comments)   Confusion    Toradol [ketorolac Tromethamine] Other (See Comments)   confusion   Valium Other (See Comments)   Hallucinations; confusion   Doxycycline Hives, Rash        Medication List        Accurate as of January 22, 2021  9:51 AM. If you have any questions, ask your nurse or doctor.          STOP taking these medications    fluticasone 50 MCG/ACT nasal spray Commonly known as: FLONASE Stopped by: Fransisca Kaufmann Jestina Stephani, MD   Ozempic (0.25 or 0.5 MG/DOSE) 2 MG/1.5ML Sopn Generic drug: Semaglutide(0.25 or 0.5MG /DOS) Replaced by: Semaglutide (1 MG/DOSE) 4 MG/3ML Sopn Stopped by: Fransisca Kaufmann Telina Kleckley, MD       TAKE these medications    acetaminophen 650 MG CR tablet Commonly known as: TYLENOL Take 1,300 mg by mouth 2 (two) times daily.   amLODipine 10 MG tablet Commonly known as: NORVASC TAKE 1 TABLET BY MOUTH  DAILY   aspirin EC 81 MG tablet Take 81 mg by mouth at bedtime.   atorvastatin 40 MG tablet Commonly known as: LIPITOR TAKE 1 TABLET BY MOUTH IN  THE EVENING What changed: when to take this   Basaglar KwikPen 100 UNIT/ML Inject 56 Units into the skin at bedtime.   butalbital-acetaminophen-caffeine 50-325-40 MG tablet Commonly known as: FIORICET Take 1 tablet by mouth 2 (two) times daily as needed for headache.   cetirizine 10 MG tablet Commonly known as: ZYRTEC Take 10 mg by mouth daily with supper.   chlorthalidone 25 MG tablet Commonly known as: HYGROTON Take 1 tablet (25 mg total) by mouth daily.   Dexcom G6 Receiver Devi USE TO CHECK BLOOD SUGAR UP TO 4 TIMES DAILY AS DIRECTED. 1 DEVICE PER  YEAR. DX: E11.9   Dexcom G6 Sensor Misc USE TO CHECK BLOOD SUGAR UP TO 4 TIMES DAILY AS DIRECTED. CHANGE SENSOR EVERY 30 DAYS. DX: E11.9   Dexcom G6 Transmitter Misc USE TO CHECK BLOOD SUGAR UP TO 4 TIMES DAILY AS DIRECTED. CHANGE TRANSMITTER EVERY 3 MONTHS. DX: E11.9   docusate sodium 100 MG capsule Commonly known as: COLACE Take 100 mg by mouth daily as needed for mild constipation or moderate constipation.   donepezil 5 MG tablet Commonly known as: ARICEPT Take 1 tablet (5 mg total) by mouth at bedtime.   famotidine 20 MG tablet Commonly known as: PEPCID Take 1 tablet (20 mg total) by mouth 2 (two) times daily with a meal.   Fish Oil 1200 MG Caps Take 1,200-2,400 mg by mouth See admin instructions. Tale 1200 mg in the morning and 2400 mg in the evening   furosemide 20 MG tablet Commonly known as: LASIX TAKE 1 TABLET BY MOUTH  DAILY AS NEEDED   insulin lispro 100 UNIT/ML KwikPen Commonly known as: HumaLOG KwikPen Inject 20 Units into the skin 3 (three) times daily with meals.   isosorbide mononitrate 30 MG 24 hr tablet Commonly known as: IMDUR TAKE 1 TABLET BY MOUTH IN  THE EVENING   metFORMIN 500 MG 24 hr tablet Commonly known as: GLUCOPHAGE-XR Take 2 tablets (1,000 mg total) by mouth 2 (two) times daily.   nitroGLYCERIN 0.4 MG SL tablet Commonly known as: NITROSTAT DISSOLVE ONE TABLET UNDER THE TONGUE EVERY 5 MINUTES AS NEEDED FOR CHEST PAIN.  DO NOT EXCEED A TOTAL OF 3 DOSES IN 15 MINUTES   OneTouch Ultra test strip Generic drug: glucose blood Check BS TID Dx E11.59   PARoxetine 20 MG tablet Commonly known as: PAXIL Take 1 tablet (20 mg total) by mouth daily.   Semaglutide (1 MG/DOSE) 4 MG/3ML Sopn Inject 1 mg as directed once a week. Replaces: Ozempic (0.25 or 0.5 MG/DOSE) 2 MG/1.5ML Sopn Started by: Fransisca Kaufmann Meia Emley, MD   tamsulosin 0.4 MG Caps capsule Commonly known as: FLOMAX Take 1 capsule (0.4 mg total) by mouth daily.   Turmeric Curcumin 500  MG Caps Take 500 mg by mouth daily.   vitamin B-12 1000 MCG tablet Commonly known as: CYANOCOBALAMIN Take 2,000 mcg by mouth daily.   Vitamin D3 50 MCG (2000 UT) capsule Take 4,000 Units by mouth daily.         Objective:   BP (!) 125/96    Pulse 85    Ht 5\' 6"  (1.676 m)    Wt 231 lb (104.8 kg)    SpO2 97%    BMI 37.28 kg/m   Wt Readings from Last 3 Encounters:  01/22/21 231 lb (104.8 kg)  12/11/20 234 lb 6.4 oz (106.3 kg)  11/28/20 230 lb (104.3 kg)    Physical Exam Vitals and nursing note reviewed.  Constitutional:  General: He is not in acute distress.    Appearance: He is well-developed. He is not diaphoretic.  Eyes:     General: No scleral icterus.    Conjunctiva/sclera: Conjunctivae normal.  Neck:     Thyroid: No thyromegaly.  Cardiovascular:     Rate and Rhythm: Normal rate and regular rhythm.     Heart sounds: Normal heart sounds. No murmur heard. Pulmonary:     Effort: Pulmonary effort is normal. No respiratory distress.     Breath sounds: Normal breath sounds. No wheezing.  Musculoskeletal:        General: Normal range of motion.     Cervical back: Neck supple.  Lymphadenopathy:     Cervical: No cervical adenopathy.  Skin:    General: Skin is warm and dry.     Findings: No rash.  Neurological:     Mental Status: He is alert and oriented to person, place, and time.     Coordination: Coordination normal.  Psychiatric:        Behavior: Behavior normal.      Assessment & Plan:   Problem List Items Addressed This Visit       Cardiovascular and Mediastinum   Hypertension associated with diabetes ([place])   Relevant Medications   Semaglutide, 1 MG/DOSE, 4 MG/3ML SOPN     Endocrine   Hyperlipidemia associated with type 2 diabetes mellitus (HCC)   Relevant Medications   Semaglutide, 1 MG/DOSE, 4 MG/3ML SOPN   Type 2 diabetes mellitus with circulatory disorder (HCC)   Relevant Medications   Semaglutide, 1 MG/DOSE, 4 MG/3ML SOPN    Microalbuminuria due to type 2 diabetes mellitus (HCC)   Relevant Medications   Semaglutide, 1 MG/DOSE, 4 MG/3ML SOPN   Other Visit Diagnoses     Type 2 diabetes mellitus without complication, without long-term current use of insulin (HCC)    -  Primary   Relevant Medications   Semaglutide, 1 MG/DOSE, 4 MG/3ML SOPN   Other Relevant Orders   Bayer DCA Hb A1c Waived       A1c much improved at 7.3, patient wants to increase Ozempic because of the additional benefits of the weight loss and its been helping with his diabetes so will increase the dose.  He is going through prescription assistance for this.  Blood pressure slightly elevated today but because we have recently lowered medicines because of hypotension I am hesitant to increase them again and we will monitor in the future. Follow up plan: Return in about 3 months (around 04/22/2021), or if symptoms worsen or fail to improve, for Diabetes and hypertension.  Counseling provided for all of the vaccine components Orders Placed This Encounter  Procedures   Bayer [place] Hb A1c [place] Makale Pindell, MD [facility] 01/22/2021, 9:51 AM

## 2021-01-23 ENCOUNTER — Telehealth: Payer: Self-pay | Admitting: Family Medicine

## 2021-01-23 NOTE — Telephone Encounter (Signed)
Pt's wife calling because she needs help completing paper work for patient assistance. Would like for Almyra Free to call her back.

## 2021-01-23 NOTE — Telephone Encounter (Signed)
Can you call patient?  I'm pretty sure we completed all paperwork for med assistance Insulins and increased dose of ozempic  Thank you!

## 2021-01-26 DIAGNOSIS — G4733 Obstructive sleep apnea (adult) (pediatric): Secondary | ICD-10-CM | POA: Diagnosis not present

## 2021-01-26 NOTE — Telephone Encounter (Signed)
Returned phone call to pt. [employer] mailed letter to pt about missing info on renewal application. Pg. 3 is missing dates. Informed pt that dates will be corrected and refaxed to company. Also informed pt that once application is approved, be mindful that shipment times are about a month out but he will be contacted when medication arrives at office.

## 2021-01-27 ENCOUNTER — Telehealth: Payer: Self-pay | Admitting: Family Medicine

## 2021-01-27 NOTE — Telephone Encounter (Signed)
I think you were already working with them if you can let them know.  I'm not sure what date?

## 2021-01-27 NOTE — Telephone Encounter (Signed)
Pts wife called stating that they received a letter in the mail from the patient assistance program stating that they received pts paperwork but it is missing patients signature date.  Wife needs to know what date to put on the form that was sent back to them.  Please advise and call wife.

## 2021-01-28 ENCOUNTER — Other Ambulatory Visit (HOSPITAL_COMMUNITY): Payer: Self-pay

## 2021-01-28 NOTE — Telephone Encounter (Signed)
Spoke to pt's wife regarding [employer] application for [employer]. Informed her that the application was updated and refaxed to the company for renewal. Wife states they picked up ozempic at [place] recently for $140 so pt would not run out of medication. Wife aware they will receive a phone call from office once medication delivers. Gave her my direct line for pap questions [phone]).

## 2021-02-02 ENCOUNTER — Ambulatory Visit: Payer: Medicare Other | Admitting: Dermatology

## 2021-02-02 ENCOUNTER — Other Ambulatory Visit: Payer: Self-pay

## 2021-02-02 DIAGNOSIS — L918 Other hypertrophic disorders of the skin: Secondary | ICD-10-CM | POA: Diagnosis not present

## 2021-02-02 DIAGNOSIS — L729 Follicular cyst of the skin and subcutaneous tissue, unspecified: Secondary | ICD-10-CM

## 2021-02-02 DIAGNOSIS — L570 Actinic keratosis: Secondary | ICD-10-CM | POA: Diagnosis not present

## 2021-02-02 DIAGNOSIS — L821 Other seborrheic keratosis: Secondary | ICD-10-CM

## 2021-02-02 DIAGNOSIS — Z1283 Encounter for screening for malignant neoplasm of skin: Secondary | ICD-10-CM | POA: Diagnosis not present

## 2021-02-02 DIAGNOSIS — D485 Neoplasm of uncertain behavior of skin: Secondary | ICD-10-CM

## 2021-02-02 HISTORY — DX: Actinic keratosis: L57.0

## 2021-02-02 NOTE — Patient Instructions (Signed)

## 2021-02-05 ENCOUNTER — Telehealth: Payer: Medicare Other

## 2021-02-07 DIAGNOSIS — E119 Type 2 diabetes mellitus without complications: Secondary | ICD-10-CM | POA: Diagnosis not present

## 2021-02-07 DIAGNOSIS — Z794 Long term (current) use of insulin: Secondary | ICD-10-CM | POA: Diagnosis not present

## 2021-02-12 ENCOUNTER — Ambulatory Visit: Payer: Medicare Other | Admitting: Cardiology

## 2021-02-12 ENCOUNTER — Encounter: Payer: Self-pay | Admitting: Cardiology

## 2021-02-12 VITALS — BP 128/72 | HR 74 | Ht 66.0 in | Wt 231.4 lb

## 2021-02-12 DIAGNOSIS — E782 Mixed hyperlipidemia: Secondary | ICD-10-CM

## 2021-02-12 DIAGNOSIS — I25119 Atherosclerotic heart disease of native coronary artery with unspecified angina pectoris: Secondary | ICD-10-CM | POA: Diagnosis not present

## 2021-02-12 DIAGNOSIS — Z952 Presence of prosthetic heart valve: Secondary | ICD-10-CM | POA: Diagnosis not present

## 2021-02-12 DIAGNOSIS — I10 Essential (primary) hypertension: Secondary | ICD-10-CM | POA: Diagnosis not present

## 2021-02-12 NOTE — Patient Instructions (Signed)

## 2021-02-12 NOTE — Progress Notes (Signed)
Cardiology Office Note  Date: 02/12/2021   ID: [name], [name] Feb 11, 1940, MRN [id number]  PCP:  Dettinger, Fransisca Kaufmann, MD  Cardiologist:  Rozann Lesches, MD Electrophysiologist:  None   Chief Complaint  Patient presents with   Cardiac follow-up    History of Present Illness: [name] is an 81 y.o. male last seen in July 2022.  He has had interval follow-up in the structural heart clinic, I reviewed the note from December 2022.  He is here for routine visit.  Reports NYHA class II dyspnea, no palpitations or exertional chest pain.  We went over his medications which are outlined below.  He states that he recently started on Ozempic as well.  He has been trying to lose some weight through diet.  I reviewed his lab work from October 2022, LDL was 63 at that time on Lipitor.  I reviewed his echocardiogram report from December 2022 as noted below.  Past Medical History:  Diagnosis Date   Anxiety    Aortic atherosclerosis ([place]) 12/19/2017   Aortic stenosis    26 mm Edwards S3U THV November 2021   Arthritis    Bursitis of hip    Cervical spondylosis    Chronic headaches    Coronary atherosclerosis of native coronary artery    BMS nondominant RCA 12/2004, DES x2 to mid LAD 11/2019   Essential hypertension    GERD (gastroesophageal reflux disease)    History of adenomatous polyp of colon    History of kidney stones    History of MI (myocardial infarction) 12/2004   History of viral meningitis 02/28/2018   December 2019   Hyperlipidemia    Internal hemorrhoids    Low back pain    Nocturia more than twice per night    OSA (obstructive sleep apnea)    Spinal stenosis    Type 2 diabetes mellitus treated with insulin ([place])    UTI (urinary tract infection)    Wears dentures    Upper    Past Surgical History:  Procedure Laterality Date   CARDIAC CATHETERIZATION  06-14-2004  dr Lia Foyer   total occlusion mD1, RI 30-40%, mRCA nondominant hazy 80% and  scattered 30-40%,  ef 71% (medical mangement)   CARDIOVASCULAR STRESS TEST  09-17-2015   dr Domenic Polite   Low risk nuclear study w/ reversible mild small anteroapical defect,  normal LV function and wall motion, nuclear stress ef 61%   CATARACT EXTRACTION W/ INTRAOCULAR LENS IMPLANT Right 07/2014   COLONOSCOPY  09/10/2008   normal   CORONARY ANGIOPLASTY WITH STENT PLACEMENT  12-17-2004   dr benismhon/ dr Olevia Perches   nonobstructive cad LAD and LCFx,  BMS x1 to total occlusion RCA    CORONARY ATHERECTOMY  11/22/2019   CORONARY ATHERECTOMY N/A 11/22/2019   Procedure: CORONARY ATHERECTOMY;  Surgeon: Burnell Blanks, MD;  Location: [facility];  Service: Cardiovascular;  Laterality: N/A;   CYSTOSCOPY W/ URETEROSCOPY W/ LITHOTRIPSY  08/2010   ERCP  03/14/2008   EYE SURGERY Bilateral    cataracts   LAPAROSCOPIC CHOLECYSTECTOMY  05/2012   LEFT HEART CATHETERIZATION WITH CORONARY ANGIOGRAM N/A 05/14/2011   Procedure: LEFT HEART CATHETERIZATION WITH CORONARY ANGIOGRAM;  Surgeon: Sherren Mocha, MD;  Location: [facility] CATH LAB;  Service: Cardiovascular;  Laterality: N/A;    non-obstructive LM, LAD, LCFx, patent RCA stent   LUMBAR LAMINECTOMY/DECOMPRESSION MICRODISCECTOMY N/A 02/24/2017   Procedure: Microlumbar decompression L4-5, L5-S1;  Surgeon: Susa Day, MD;  Location: [facility];  Service:  Orthopedics;  Laterality: N/A;  120 mins   RIGHT/LEFT HEART CATH AND CORONARY ANGIOGRAPHY N/A 11/14/2019   Procedure: RIGHT/LEFT HEART CATH AND CORONARY ANGIOGRAPHY;  Surgeon: Burnell Blanks, MD;  Location: [facility];  Service: Cardiovascular;  Laterality: N/A;   ROTATOR CUFF REPAIR Right 03/2002   ROTATOR CUFF REPAIR Left 12/11/2015   TEE WITHOUT CARDIOVERSION N/A 12/04/2019   Procedure: TRANSESOPHAGEAL ECHOCARDIOGRAM (TEE);  Surgeon: Burnell Blanks, MD;  Location: [facility];  Service: Open Heart Surgery;  Laterality: N/A;   TOTAL HIP ARTHROPLASTY Right 12/20/2008   TOTAL HIP  ARTHROPLASTY Left 07/05/2018   Procedure: TOTAL HIP ARTHROPLASTY ANTERIOR APPROACH;  Surgeon: Gaynelle Arabian, MD;  Location: [facility];  Service: Orthopedics;  Laterality: Left;  128min   TOTAL KNEE ARTHROPLASTY Bilateral left 12-11-2003/  right 07-31-2004   dr Wynelle Link [facility]   TRANSCATHETER AORTIC VALVE REPLACEMENT, TRANSFEMORAL N/A 12/04/2019   Procedure: TRANSCATHETER AORTIC VALVE REPLACEMENT, TRANSFEMORAL;  Surgeon: Burnell Blanks, MD;  Location: [facility];  Service: Open Heart Surgery;  Laterality: N/A;   UPPER GASTROINTESTINAL ENDOSCOPY  01/08/2008   bx, inlet patch, duodenitis   YAG LASER APPLICATION Right 9/38/1829   Procedure: YAG LASER APPLICATION;  Surgeon: Williams Che, MD;  Location: [facility];  Service: Ophthalmology;  Laterality: Right;    Current Outpatient Medications  Medication Sig Dispense Refill   acetaminophen (TYLENOL) 650 MG CR tablet Take 1,300 mg by mouth 2 (two) times daily.     amLODipine (NORVASC) 10 MG tablet TAKE 1 TABLET BY MOUTH  DAILY 90 tablet 3   aspirin EC 81 MG tablet Take 81 mg by mouth at bedtime.      atorvastatin (LIPITOR) 40 MG tablet TAKE 1 TABLET BY MOUTH IN  THE EVENING (Patient taking differently: Take 40 mg by mouth daily.) 90 tablet 3   butalbital-acetaminophen-caffeine (FIORICET) 50-325-40 MG tablet Take 1 tablet by mouth 2 (two) times daily as needed for headache.     cetirizine (ZYRTEC) 10 MG tablet Take 10 mg by mouth daily with supper.      Cholecalciferol (VITAMIN D3) 50 MCG (2000 UT) capsule Take 4,000 Units by mouth daily.      Continuous Blood Gluc Receiver (DEXCOM G6 RECEIVER) DEVI USE TO CHECK BLOOD SUGAR UP TO 4 TIMES DAILY AS DIRECTED. 1 DEVICE PER YEAR. DX: E11.9 1 each 1   Continuous Blood Gluc Sensor (DEXCOM G6 SENSOR) MISC USE TO CHECK BLOOD SUGAR UP TO 4 TIMES DAILY AS DIRECTED. CHANGE SENSOR EVERY 30 DAYS. DX: E11.9 3 each 4   Continuous Blood Gluc Transmit (DEXCOM G6 TRANSMITTER) MISC USE TO CHECK BLOOD SUGAR UP  TO 4 TIMES DAILY AS DIRECTED. CHANGE TRANSMITTER EVERY 3 MONTHS. DX: E11.9 1 each 12   docusate sodium (COLACE) 100 MG capsule Take 100 mg by mouth daily as needed for mild constipation or moderate constipation.      donepezil (ARICEPT) 5 MG tablet Take 1 tablet (5 mg total) by mouth at bedtime. 90 tablet 3   famotidine (PEPCID) 20 MG tablet Take 1 tablet (20 mg total) by mouth 2 (two) times daily with a meal. 180 tablet 3   furosemide (LASIX) 20 MG tablet TAKE 1 TABLET BY MOUTH  DAILY AS NEEDED 90 tablet 3   glucose blood (ONETOUCH ULTRA) test strip Check BS TID Dx E11.59 400 each 3   Insulin Glargine (BASAGLAR KWIKPEN) 100 UNIT/ML Inject 56 Units into the skin at bedtime. 15 mL 3   insulin  lispro (HUMALOG KWIKPEN) 100 UNIT/ML KwikPen Inject 20 Units into the skin 3 (three) times daily with meals. 45 mL 11   isosorbide mononitrate (IMDUR) 30 MG 24 hr tablet TAKE 1 TABLET BY MOUTH IN  THE EVENING 90 tablet 3   metFORMIN (GLUCOPHAGE-XR) 500 MG 24 hr tablet Take 2 tablets (1,000 mg total) by mouth 2 (two) times daily. 360 tablet 3   nitroGLYCERIN (NITROSTAT) 0.4 MG SL tablet DISSOLVE ONE TABLET UNDER THE TONGUE EVERY 5 MINUTES AS NEEDED FOR CHEST PAIN.  DO NOT EXCEED A TOTAL OF 3 DOSES IN 15 MINUTES 25 tablet 3   Omega-3 Fatty Acids (FISH OIL) 1200 MG CAPS Take 1,200-2,400 mg by mouth See admin instructions. Tale 1200 mg in the morning and 2400 mg in the evening     PARoxetine (PAXIL) 20 MG tablet Take 1 tablet (20 mg total) by mouth daily. 90 tablet 3   PFIZER-BIONT COVID-19 VAC-TRIS SUSP injection      Semaglutide, 1 MG/DOSE, 4 MG/3ML SOPN Inject 1 mg as directed once a week. 9 mL 3   tamsulosin (FLOMAX) 0.4 MG CAPS capsule Take 1 capsule (0.4 mg total) by mouth daily. 90 capsule 3   Turmeric Curcumin 500 MG CAPS Take 500 mg by mouth daily.     vitamin B-12 (CYANOCOBALAMIN) 1000 MCG tablet Take 2,000 mcg by mouth daily.     chlorthalidone (HYGROTON) 25 MG tablet Take 1 tablet (25 mg total) by mouth  daily. (Patient not taking: Reported on 02/12/2021) 90 tablet 3   No current facility-administered medications for this visit.   Allergies:  Ace inhibitors, Angiotensin receptor blockers, Oxycodone-acetaminophen, Robaxin [methocarbamol], Toradol [ketorolac tromethamine], Valium, and Doxycycline   ROS: No orthopnea or PND.  Physical Exam: VS:  BP 128/72    Pulse 74    Ht 5\' 6"  (1.676 m)    Wt 231 lb 6.4 oz (105 kg)    SpO2 96%    BMI 37.35 kg/m , BMI Body mass index is 37.35 kg/m.  Wt Readings from Last 3 Encounters:  02/12/21 231 lb 6.4 oz (105 kg)  01/22/21 231 lb (104.8 kg)  12/11/20 234 lb 6.4 oz (106.3 kg)    General: Patient appears comfortable at rest. HEENT: Conjunctiva and lids normal, wearing a mask. Neck: Supple, no elevated JVP or carotid bruits, no thyromegaly. Lungs: Clear to auscultation, nonlabored breathing at rest. Cardiac: Regular rate and rhythm, no S3, 2/6 systolic murmur. Abdomen: Protuberant, bowel sounds present. Extremities: No pitting edema.  ECG:  An ECG dated 07/01/2020 was personally reviewed today and demonstrated:  Sinus rhythm with left atrial enlargement, IVCD.  Recent Labwork: 07/01/2020: B Natriuretic Peptide 51.0 07/02/2020: Magnesium 1.7 07/18/2020: ALT 17; AST 13 08/13/2020: BUN 14; Creatinine, Ser 1.01; Potassium 4.3; Sodium 139 10/20/2020: Hemoglobin 13.1; Platelets 290     Component Value Date/Time   CHOL 129 10/20/2020 0854   CHOL 139 06/02/2012 1007   TRIG 187 (H) 10/20/2020 0854   TRIG 185 (H) 04/10/2013 0937   TRIG 107 06/02/2012 1007   HDL 35 (L) 10/20/2020 0854   HDL 34 (L) 04/10/2013 0937   HDL 40 06/02/2012 1007   CHOLHDL 3.7 10/20/2020 0854   CHOLHDL 3.8 11/16/2019 0206   VLDL 25 11/16/2019 0206   LDLCALC 63 10/20/2020 0854   LDLCALC 62 04/10/2013 0937   LDLCALC 78 06/02/2012 1007    Other Studies Reviewed Today:  Echocardiogram 12/11/2020:  1. Left ventricular ejection fraction, by estimation, is 60 to 65%. The  left  ventricle has normal function. The left ventricle has no regional  wall motion abnormalities. There is mild concentric left ventricular  hypertrophy. Left ventricular diastolic  parameters are consistent with Grade I diastolic dysfunction (impaired  relaxation). The average left ventricular global longitudinal strain is  18.5 %. The global longitudinal strain is normal.   2. Right ventricular systolic function is normal. The right ventricular  size is normal. There is normal pulmonary artery systolic pressure. The  estimated right ventricular systolic pressure is 26.8 mmHg.   3. The mitral valve is degenerative. Trivial mitral valve regurgitation.   4. The aortic valve has been repaired/replaced. There is a 35mm Edwards  Sapien TAVR valve present in the aortic position. Echo findings are  consistent with normal structure and function of the aortic valve  prosthesis. Mean gradient 22mmHg, Vmax 2.2 m/s,   DI 0.41. There is mild paravalvular leak.   5. Aortic dilatation noted. There is mild dilatation of the aortic root,  measuring 42 mm. There is mild dilatation of the ascending aorta,  measuring 43 mm.   6. The inferior vena cava is normal in size with greater than 50%  respiratory variability, suggesting right atrial pressure of 3 mmHg.   Assessment and Plan:  1.  History of severe aortic stenosis status post TAVR in November 2021.  Recent follow-up echocardiogram in December 2022 shows grossly normal prosthetic function with mean gradient 10 mmHg and mild paravalvular regurgitation.  Aortic root dilatation remains mild.  2.  CAD status post BMS to nondominant RCA in 2006 as well as DES x2 to the mid LAD in November 2021.  He does not report any active angina at this time on medical therapy.  Continue aspirin, Norvasc, Imdur, and Lipitor.  3.  Mixed hyperlipidemia, on Lipitor with recent LDL 63.  4.  Essential hypertension, blood pressure is reasonably well controlled today.  He is on  Norvasc and chlorthalidone.  Medication Adjustments/Labs and Tests Ordered: Current medicines are reviewed at length with the patient today.  Concerns regarding medicines are outlined above.   Tests Ordered: No orders of the defined types were placed in this encounter.   Medication Changes: No orders of the defined types were placed in this encounter.   Disposition:  Follow up  6 months.  Signed, Satira Sark, MD, [facility] 02/12/2021 2:29 PM    [facility] [place], [place], [place] [address] Phone: [phone]; Fax: [phone]

## 2021-02-18 ENCOUNTER — Telehealth: Payer: Self-pay | Admitting: Dermatology

## 2021-02-18 NOTE — Telephone Encounter (Signed)
Wife called for results for Ramirez from last month

## 2021-02-18 NOTE — Telephone Encounter (Signed)
Phone call from patient's wife wanting patient's pathology results. Path to patient's wife.

## 2021-02-20 NOTE — Progress Notes (Signed)
Received notification from [employer] regarding approval for [place]. Patient assistance approved from 02/13/21 to 12/10/21.  4 MONTH SUPPLY OF MEDICATION PROCESSING FOR SHIPPING. CURRENT SHIPPING DELAYS-- MEDICATION COULD SHIP IN ABOUT 6 WEEKS. PT CAN CALL [facility] TO F/U   Phone: [phone]

## 2021-02-21 ENCOUNTER — Encounter: Payer: Self-pay | Admitting: Dermatology

## 2021-02-21 NOTE — Progress Notes (Signed)
° °  Follow-Up Visit   Subjective  [name] is a 81 y.o. male who presents for the following: Annual Exam (Pt here for annual. Pt has no other concerns ).  General skin examination Location:  Duration:  Quality:  Associated Signs/Symptoms: Modifying Factors:  Severity:  Timing: Context:   Objective  Well appearing patient in no apparent distress; mood and affect are within normal limits. Scalp General skin examination, no atypical pigmented lesions.  1 possible nonmelanoma skin cancer right upper back will be biopsied.  Left Supraorbital Region, Right Lower Eyelid Half millimeter pedunculated flesh-colored papules  Chest - Medial (Center), Left Breast, Left Knee - Anterior, Mid Back Multiple brown textured flattopped papules with typical dermoscopy  Right Upper Back Waxy pink 6 mm crust, rule out carcinoma       Neck - Anterior 7 mm noninflamed white dermal papule    A full examination was performed including scalp, head, eyes, ears, nose, lips, neck, chest, axillae, abdomen, back, buttocks, bilateral upper extremities, bilateral lower extremities, hands, feet, fingers, toes, fingernails, and toenails. All findings within normal limits unless otherwise noted below.   Assessment & Plan    Encounter for screening for malignant neoplasm of skin Scalp  Annual skin examination.  Skin tag (2) Left Supraorbital Region; Right Lower Eyelid  No invention necessary  Seborrheic keratosis (4) Left Knee - Anterior; Chest - Medial [facility]); Left Breast; Mid Back  Leave if stable  Neoplasm of uncertain behavior of skin Right Upper Back  Skin / nail biopsy Type of biopsy: tangential   Informed consent: discussed and consent obtained   Timeout: patient name, date of birth, surgical site, and procedure verified   Anesthesia: the lesion was anesthetized in a standard fashion   Anesthetic:  1% lidocaine w/ epinephrine 1-100,000 local infiltration Instrument  used: flexible razor blade   Hemostasis achieved with: aluminum chloride and electrodesiccation   Outcome: patient tolerated procedure well   Post-procedure details: wound care instructions given    Specimen 1 - Surgical pathology Differential Diagnosis: bcc vs scc  Check Margins: No  Cyst of skin Neck - Anterior  No treatment indicated      I, Lavonna Monarch, MD, have reviewed all documentation for this visit.  The documentation on 02/21/21 for the exam, diagnosis, procedures, and orders are all accurate and complete.

## 2021-02-27 ENCOUNTER — Ambulatory Visit (INDEPENDENT_AMBULATORY_CARE_PROVIDER_SITE_OTHER): Payer: Medicare Other | Admitting: Pharmacist

## 2021-02-27 DIAGNOSIS — E1159 Type 2 diabetes mellitus with other circulatory complications: Secondary | ICD-10-CM

## 2021-02-27 DIAGNOSIS — E1169 Type 2 diabetes mellitus with other specified complication: Secondary | ICD-10-CM

## 2021-02-27 DIAGNOSIS — Z794 Long term (current) use of insulin: Secondary | ICD-10-CM

## 2021-03-05 NOTE — Patient Instructions (Signed)
Visit Information  Following are the goals we discussed today:  Current Barriers:  Unable to independently afford treatment regimen Unable to achieve control of T2DM   Pharmacist Clinical Goal(s):  Over the next 90 days, patient will verbalize ability to afford treatment regimen achieve control of T2DM as evidenced by IMPROVED GLYCEMIC CONTROL through collaboration with PharmD and provider.    Interventions: 1:1 collaboration with Dettinger, Fransisca Kaufmann, MD regarding development and update of comprehensive plan of care as evidenced by provider attestation and co-signature Inter-disciplinary care team collaboration (see longitudinal plan of care) Comprehensive medication review performed; medication list updated in electronic medical record  Diabetes: Uncontrolled (a1c 8.4% -->7.3%); current treatment: BASAGLAR 54 UNITS, HUMALOG KWIKPEN 20 UNITS TID MEALS, METFORMIN 2G PER DAY;  GFR 65, A1C 7.3% CONTINUE TO WORK ON DIET Continue Ozempic to 1mg  weekly Continue Basaglar to 50 units Continue Humalog meal time to 15 units Current glucose readings: fasting glucose: <200, post prandial glucose: 180-200s Patient wears dexcom CGM Difficulty with readings at times Denies hypoglycemic/hyperglycemic symptoms Discussed meal planning options and Plate method for healthy eating Avoid sugary drinks and desserts Incorporate balanced protein, non starchy veggies, 1 serving of carbohydrate with each meal Increase water intake Increase physical activity as able Current exercise: N/A Educated on DEXCOM, REFILLS CALLED IN discussed dexcom g7--should be available in the next month Assessed patient finances. Re-enrollment approved [facility] [place] & HUMALOG--FOR 2023; approved for novo nordisk PAP for ozempic2023--all shipments delayed  Patient Goals/Self-Care Activities Over the next 90 days, patient will:  - take medications as prescribed check glucose  CONTINUOUSLY USING CGM, document, and provide at future appointments collaborate with provider on medication access solutions  Follow Up Plan: Telephone follow up appointment with care management team member scheduled for: 2 months  Signature Regina Eck, PharmD, BCPS Clinical Pharmacist, [place]  II Phone [phone]   Please call the care guide team at [phone] if you need to cancel or reschedule your appointment.   Patient verbalizes understanding of instructions and care plan provided today and agrees to view in [place]. Active MyChart status confirmed with patient.

## 2021-03-05 NOTE — Progress Notes (Signed)
Chronic Care Management Pharmacy Note  02/27/2021 Name:  [name] MRN:  [id number] DOB:  02/10/1940  Summary: t2dm, ckd  Recommendations/Changes made from today's visit: Diabetes: Uncontrolled (a1c 8.4% -->7.3%); current treatment: BASAGLAR 54 UNITS, HUMALOG KWIKPEN 20 UNITS TID MEALS, METFORMIN 2G PER DAY;  GFR 75, A1C 7.3% CONTINUE TO WORK ON DIET Continue Ozempic to 56m weekly Continue Basaglar to 50 units Continue Humalog meal time to 15 units Educated on DEXCOM, REFILLS CALLED IN discussed dexcom g7--should be available in the next month Assessed patient finances. Re-enrollment approved [facility] FOR BAS[facility]LAR & HUMALOG--FOR 2023; approved for novo nordisk PAP for ozempic2023--all shipments delayed  Subjective: [name] an 81y.o. year old male who is a primary patient of [name].  The CCM team was consulted for assistance with disease management and care coordination needs.    Engaged with patient by telephone for follow up visit in response to provider referral for pharmacy case management and/or care coordination services.   Consent to Services:  The patient was given information about Chronic Care Management services, agreed to services, and gave verbal consent prior to initiation of services.  Please see initial visit note for detailed documentation.   Patient Care Team: [name] as PCP - General (Family Medicine) [name] as PCP - Cardiology (Cardiology) [name] as PCP - Structural Heart (Cardiology) [name] as Consulting Physician (Cardiology) [name] as Consulting Physician (Orthopedic Surgery) [name] as Consulting Physician (Orthopedic Surgery) [name] as Consulting Physician (Physical Medicine and Rehabilitation) [name](Pharmacist) [name] as Consulting Physician  (Dermatology)  Objective:  Lab Results  Component Value Date   CREATININE 1.01 08/13/2020   CREATININE 1.02 07/18/2020   CREATININE 0.82 07/07/2020    Lab Results  Component Value Date   HGBA1C 7.3 (H) 01/22/2021   Last diabetic Eye exam:  Lab Results  Component Value Date/Time   HMDIABEYEEXA No Retinopathy 03/26/2019 12:00 AM    Last diabetic Foot exam: No results found for: HMDIABFOOTEX      Component Value Date/Time   CHOL 129 10/20/2020 0854   CHOL 139 06/02/2012 1007   TRIG 187 (H) 10/20/2020 0854   TRIG 185 (H) 04/10/2013 0937   TRIG 107 06/02/2012 1007   HDL 35 (L) 10/20/2020 0854   HDL 34 (L) 04/10/2013 0937   HDL 40 06/02/2012 1007   CHOLHDL 3.7 10/20/2020 0854   CHOLHDL 3.8 11/16/2019 0206   VLDL 25 11/16/2019 0206   LDLCALC 63 10/20/2020 0854   LDLCALC 62 04/10/2013 0937   LLeona78 06/02/2012 1007    Hepatic Function Latest Ref Rng & Units 07/18/2020 07/02/2020 07/02/2020  Total Protein 6.0 - 8.5 g/dL 6.8 7.0 7.3  Albumin 3.7 - 4.7 g/dL 3.9 3.4(L) 3.6  AST 0 - 40 IU/L '13 18 18  ' ALT 0 - 44 IU/L '17 18 18  ' Alk Phosphatase 44 - 121 IU/L 77 74 74  Total Bilirubin 0.0 - 1.2 mg/dL 0.5 0.7 1.0  Bilirubin, Direct 0.0 - 0.3 mg/dL - - -    Lab Results  Component Value Date/Time   TSH 0.930 12/15/2017 05:52 AM   TSH 1.530 04/28/2017 09:01 AM   TSH 1.490 04/10/2013 09:37 AM    CBC Latest Ref Rng & Units 10/20/2020 07/18/2020 07/07/2020  WBC 3.4 - 10.8 x10E3/uL 7.3 6.7 7.7  Hemoglobin 13.0 - 17.7  g/dL 13.1 12.8(L) 12.2(L)  Hematocrit 37.5 - 51.0 % 38.6 39.7 38.2(L)  Platelets 150 - 450 x10E3/uL 290 299 276    Lab Results  Component Value Date/Time   VD25OH 44.6 10/26/2016 09:01 AM    Clinical ASCVD: No  The ASCVD Risk score (Arnett DK, et al., 2019) failed to calculate for the following reasons:   The 2019 ASCVD risk score is only valid for ages 59 to 81    Other: (CHADS2VASc if Afib, PHQ9 if depression, MMRC or CAT for COPD, ACT, DEXA)  Social  History   Tobacco Use  Smoking Status Former   Packs/day: 1.00   Years: 20.00   Pack years: 20.00   Types: Cigarettes   Start date: 01/18/1963   Quit date: 08/11/1988   Years since quitting: 32.5  Smokeless Tobacco Former   Types: Chew   Quit date: 01/11/1989  Tobacco Comments   chewed 1 pack tobacco/day for 15 years   BP Readings from Last 3 Encounters:  02/12/21 128/72  01/22/21 (!) 125/96  12/11/20 (!) 140/48   Pulse Readings from Last 3 Encounters:  02/12/21 74  01/22/21 85  12/11/20 78   Wt Readings from Last 3 Encounters:  02/12/21 231 lb 6.4 oz (105 kg)  01/22/21 231 lb (104.8 kg)  12/11/20 234 lb 6.4 oz (106.3 kg)    Assessment: Review of patient past medical history, allergies, medications, health status, including review of consultants reports, laboratory and other test data, was performed as part of comprehensive evaluation and provision of chronic care management services.   SDOH:  (Social Determinants of Health) assessments and interventions performed:    CCM Care Plan  Allergies  Allergen Reactions   Ace Inhibitors Hives, Swelling and Rash    Rash,hives,tongue swelling   Angiotensin Receptor Blockers     Unknown reaction    Oxycodone-Acetaminophen Other (See Comments)    Unknown reaction   Robaxin [Methocarbamol] Other (See Comments)    Confusion    Toradol [Ketorolac Tromethamine] Other (See Comments)    confusion   Valium Other (See Comments)    Hallucinations; confusion   Doxycycline Hives and Rash    Medications Reviewed Today     Reviewed by Lavonna Monarch, [name] (Physician) on 02/21/21 at 1109  Med List Status: <None>   Medication Order Taking? Sig Documenting Provider Last Dose Status Informant  acetaminophen (TYLENOL) 650 MG CR tablet [id number] Yes Take 1,300 mg by mouth 2 (two) times daily. Provider, Historical, [name] Taking Active Spouse/Significant Other  amLODipine (NORVASC) 10 MG tablet [id number] Yes TAKE 1 TABLET BY MOUTH  DAILY Eileen Stanford, PA-C Taking Active Spouse/Significant Other  aspirin EC 81 MG tablet [id number] Yes Take 81 mg by mouth at bedtime.  Provider, Historical, [name] Taking Active Spouse/Significant Other  atorvastatin (LIPITOR) 40 MG tablet [id number] Yes TAKE 1 TABLET BY MOUTH IN  THE EVENING  Patient taking differently: Take 40 mg by mouth daily.   Eileen Stanford, PA-C Taking Active Spouse/Significant Other  butalbital-acetaminophen-caffeine (FIORICET) 50-325-40 MG tablet [id number] Yes Take 1 tablet by mouth 2 (two) times daily as needed for headache. Provider, Historical, [name] Taking Active Spouse/Significant Other  cetirizine (ZYRTEC) 10 MG tablet [id number] Yes Take 10 mg by mouth daily with supper.  Provider, Historical, [name] Taking Active Spouse/Significant Other  chlorthalidone (HYGROTON) 25 MG tablet [id number] Yes Take 1 tablet (25 mg total) by mouth daily.  Patient not taking: Reported on 02/12/2021   Satira Sark, [name] Taking Active   Cholecalciferol (  VITAMIN D3) 50 MCG (2000 UT) capsule [id number] Yes Take 4,000 Units by mouth daily.  Provider, Historical, [name] Taking Active Spouse/Significant Other  Continuous Blood Gluc Receiver (DEXCOM G6 RECEIVER) DEVI [id number] Yes USE TO CHECK BLOOD SUGAR UP TO 4 TIMES DAILY AS DIRECTED. 1 DEVICE PER YEAR. DX: E11.9 Dettinger, Fransisca Kaufmann, [name] Taking Active Spouse/Significant Other  Continuous Blood Gluc Sensor (DEXCOM G6 SENSOR) MISC [id number] Yes USE TO CHECK BLOOD SUGAR UP TO 4 TIMES DAILY AS DIRECTED. CHANGE SENSOR EVERY 30 DAYS. DX: E11.9 Dettinger, Fransisca Kaufmann, [name] Taking Active   Continuous Blood Gluc Transmit (DEXCOM G6 TRANSMITTER) MISC [id number] Yes USE TO CHECK BLOOD SUGAR UP TO 4 TIMES DAILY AS DIRECTED. CHANGE TRANSMITTER EVERY 3 MONTHS. DX: E11.9 Dettinger, Fransisca Kaufmann, [name] Taking Active   docusate sodium (COLACE) 100 MG capsule [id number] Yes Take 100 mg by mouth daily as needed for mild constipation or moderate constipation.  Provider, Historical, [name] Taking Active  Spouse/Significant Other  donepezil (ARICEPT) 5 MG tablet [id number] Yes Take 1 tablet (5 mg total) by mouth at bedtime. Dettinger, Fransisca Kaufmann, [name] Taking Active Spouse/Significant Other  famotidine (PEPCID) 20 MG tablet [id number] Yes Take 1 tablet (20 mg total) by mouth 2 (two) times daily with a meal. Dettinger, Fransisca Kaufmann, [name] Taking Active Spouse/Significant Other  furosemide (LASIX) 20 MG tablet [id number] Yes TAKE 1 TABLET BY MOUTH  DAILY AS NEEDED Burnell Blanks, [name] Taking Active Spouse/Significant Other  glucose blood (ONETOUCH ULTRA) test strip [id number] Yes Check BS TID Dx E11.59 Dettinger, Fransisca Kaufmann, [name] Taking Active Spouse/Significant Other  Insulin Glargine (BASAGLAR KWIKPEN) 100 UNIT/ML [id number] Yes Inject 56 Units into the skin at bedtime. Dettinger, Fransisca Kaufmann, [name] Taking Active   insulin lispro (HUMALOG KWIKPEN) 100 UNIT/ML KwikPen [id number] Yes Inject 20 Units into the skin 3 (three) times daily with meals. Dettinger, Fransisca Kaufmann, [name] Taking Active   isosorbide mononitrate (IMDUR) 30 MG 24 hr tablet [id number] Yes TAKE 1 TABLET BY MOUTH IN  THE Ophelia Shoulder, [name] Taking Active   metFORMIN (GLUCOPHAGE-XR) 500 MG 24 hr tablet [id number] Yes Take 2 tablets (1,000 mg total) by mouth 2 (two) times daily. Dettinger, Fransisca Kaufmann, [name] Taking Active Spouse/Significant Other  nitroGLYCERIN (NITROSTAT) 0.4 MG SL tablet [id number] Yes DISSOLVE ONE TABLET UNDER THE TONGUE EVERY 5 MINUTES AS NEEDED FOR CHEST PAIN.  DO NOT EXCEED A TOTAL OF 3 DOSES IN 15 MINUTES Satira Sark, [name] Taking Active Spouse/Significant Other           Med Note Lia Hopping, Toniann Fail   Thu Dec 13, 2019  3:21 PM)    Omega-3 Fatty Acids (FISH OIL) 1200 MG CAPS [id number] Yes Take 1,200-2,400 mg by mouth See admin instructions. Tale 1200 mg in the morning and 2400 mg in the evening Provider, Historical, [name] Taking Active Spouse/Significant Other  PARoxetine (PAXIL) 20 MG tablet [id number] Yes Take 1 tablet (20 mg total) by mouth  daily. Dettinger, Fransisca Kaufmann, [name] Taking Active Spouse/Significant Other  PFIZER-BIONT COVID-19 Delene Loll injection [id number] Yes  Provider, Historical, [name] Taking Active   Semaglutide, 1 MG/DOSE, 4 MG/3ML SOPN [id number] Yes Inject 1 mg as directed once a week. Dettinger, Fransisca Kaufmann, [name] Taking Active   tamsulosin [facility]) 0.4 MG CAPS capsule [id number] Yes Take 1 capsule (0.4 mg total) by mouth daily. Dettinger, Fransisca Kaufmann, [name] Taking Active Spouse/Significant Other  Turmeric Curcumin 500 MG CAPS [id number] Yes Take 500 mg by mouth daily. Provider, Historical, [name] Taking Active Spouse/Significant Other  vitamin B-12 (CYANOCOBALAMIN) 1000 MCG tablet [id number] Yes Take 2,000 mcg by mouth daily. Provider, Historical, [name] Taking Active Spouse/Significant Other            Patient Active Problem List   Diagnosis Date Noted   Bilateral chronic serous otitis media 11/04/2020   Mixed conductive and sensorineural hearing loss of left ear with restricted hearing of right ear 11/04/2020   Pleural plaque due to asbestos exposure 09/01/2020   Asbestosis ([place]) 07/15/2020   SOB (shortness of breath) on exertion 07/15/2020   Generalized weakness 07/02/2020   Thoracic ascending aortic aneurysm 07/02/2020   Obstructive sleep apnea 07/02/2020   Dementia ([place]) 07/02/2020   Severe aortic stenosis 12/04/2019   Unstable angina (HCC)    Nonrheumatic aortic valve stenosis    Degeneration of lumbar intervertebral disc 05/02/2019   Lumbar post-laminectomy syndrome 05/02/2019   OAB (overactive bladder) 11/29/2018   OA (osteoarthritis) of hip 07/05/2018   History of viral meningitis 02/28/2018   Aortic atherosclerosis ([place]) 12/19/2017   Word finding difficulty 12/14/2017   History of total hip arthroplasty 10/12/2017   Trochanteric bursitis 10/12/2017   BPH (benign prostatic hyperplasia) 07/29/2017   Cervical spondylosis 07/21/2017   Lumbar spinal stenosis 02/25/2017   Spinal stenosis at L4-L5 level 02/24/2017    Lumbar radicular pain 01/17/2017   Microalbuminuria due to type 2 diabetes mellitus ([place]) 07/23/2016   Common migraine 12/23/2014   Hx of adenomatous colonic polyps 05/09/2014   Obesity (BMI 30-39.9) 07/30/2013   Type 2 diabetes mellitus with circulatory disorder ([place]) 05/27/2011   Hyperlipidemia associated with type 2 diabetes mellitus ([place]) 10/19/2008   Hypertension associated with diabetes ([place]) 10/19/2008   CORONARY ATHEROSCLEROSIS NATIVE CORONARY ARTERY 10/17/2008   GERD (gastroesophageal reflux disease) 12/27/2007    Immunization History  Administered Date(s) Administered   Fluad Quad(high Dose 65+) 10/20/2020   Influenza, High Dose Seasonal PF 10/29/2015, 10/31/2017   Influenza, Quadrivalent, Recombinant, Inj, Pf 10/19/2018   Influenza,inj,Quad PF,6+ Mos 11/08/2013, 10/26/2016   Influenza,inj,quad, With Preservative 10/26/2016   Influenza-Unspecified 11/18/2014, 10/25/2019, 10/25/2019   PFIZER(Purple Top)SARS-COV-2 Vaccination 04/13/2019, 05/07/2019, 11/08/2019, 09/10/2020   Pneumococcal Conjugate-13 01/01/2013, 10/26/2016   Pneumococcal Polysaccharide-23 10/26/2016   Tdap 01/01/2013   Zoster Recombinat (Shingrix) 05/14/2016, 01/27/2017    Conditions to be addressed/monitored: DMII and CKD Stage 3  Care Plan : PHARMD MEDICATION MANAGEMENT  Updates made by Lavera Guise, RPH since 03/05/2021 12:00 AM     Problem: DISEASE PROGRESSION PREVENTION      Long-Range Goal: T2DM   Recent Progress: Not on track  Priority: High  Note:   Current Barriers:  Unable to independently afford treatment regimen Unable to achieve control of T2DM   Pharmacist Clinical Goal(s):  Over the next 90 days, patient will verbalize ability to afford treatment regimen achieve control of T2DM as evidenced by IMPROVED GLYCEMIC CONTROL  through collaboration with PharmD and provider.    Interventions: 1:1 collaboration with Dettinger, Fransisca Kaufmann, [name] regarding development and update of  comprehensive plan of care as evidenced by provider attestation and co-signature Inter-disciplinary care team collaboration (see longitudinal plan of care) Comprehensive medication review performed; medication list updated in electronic medical record  Diabetes: Uncontrolled (a1c 8.4% -->7.3%); current treatment: BASAGLAR 54 UNITS, HUMALOG KWIKPEN 20 UNITS TID MEALS, METFORMIN 2G PER DAY;  GFR 65, A1C 7.3% CONTINUE TO WORK ON DIET Continue Ozempic to 13m weekly Continue Basaglar to 50 units Continue Humalog meal time to 15 units Current glucose readings: fasting glucose: <200, post prandial glucose: 180-200s  Patient wears dexcom CGM Difficulty with readings at times Denies hypoglycemic/hyperglycemic symptoms Discussed meal planning options and Plate method for healthy eating Avoid sugary drinks and desserts Incorporate balanced protein, non starchy veggies, 1 serving of carbohydrate with each meal Increase water intake Increase physical activity as able Current exercise: N/A Educated on DEXCOM, REFILLS CALLED IN discussed dexcom g7--should be available in the next month Assessed patient finances. Re-enrollment approved [facility] [place] & HUMALOG--FOR 2023; approved for novo nordisk PAP for ozempic 2023--all shipments delayed  Patient Goals/Self-Care Activities Over the next 90 days, patient will:  - take medications as prescribed check glucose CONTINUOUSLY USING CGM, document, and provide at future appointments collaborate with provider on medication access solutions  Follow Up Plan: Telephone follow up appointment with care management team member scheduled for: 2 months      Patient's preferred pharmacy is:  [facility] [address] [place] [address] Phone: [phone] Fax: [phone]  [facility] ([place], [place] [facility] [address] [place] [address]  [place] [address] Phone: [phone] Fax: [phone]  [facility] ([facility] ) - [place], [place] [address] Phone: [phone] Fax: [phone]  Uses pill box? No - n/A Pt endorses 100% compliance  Follow Up:  Patient agrees to Care Plan and Follow-up.  Plan: Telephone follow up appointment with care management team member scheduled for:  3 MONTHS   Regina Eck, PharmD, BCPS Clinical Pharmacist, [place]  II Phone [phone]

## 2021-03-10 DIAGNOSIS — E1159 Type 2 diabetes mellitus with other circulatory complications: Secondary | ICD-10-CM

## 2021-03-10 DIAGNOSIS — Z794 Long term (current) use of insulin: Secondary | ICD-10-CM

## 2021-03-10 DIAGNOSIS — E785 Hyperlipidemia, unspecified: Secondary | ICD-10-CM

## 2021-03-10 DIAGNOSIS — E1169 Type 2 diabetes mellitus with other specified complication: Secondary | ICD-10-CM

## 2021-03-18 ENCOUNTER — Other Ambulatory Visit: Payer: Self-pay | Admitting: Family Medicine

## 2021-03-18 ENCOUNTER — Other Ambulatory Visit: Payer: Self-pay | Admitting: Cardiology

## 2021-03-19 ENCOUNTER — Telehealth: Payer: Self-pay | Admitting: Pharmacist

## 2021-03-19 NOTE — Telephone Encounter (Signed)
DEXCOM G7 ORDERED VIA [place] [facility] ?MED LIST NOT UPDATED UNTIL PRODUCT APPROVED ?

## 2021-03-26 ENCOUNTER — Telehealth: Payer: Self-pay

## 2021-03-26 NOTE — Telephone Encounter (Signed)
Contacted patient to inform that we have received four boxes of Ozempic that will be placed in the refrigerator for them to pick up. ?

## 2021-03-31 DIAGNOSIS — H02831 Dermatochalasis of right upper eyelid: Secondary | ICD-10-CM | POA: Diagnosis not present

## 2021-03-31 DIAGNOSIS — H02423 Myogenic ptosis of bilateral eyelids: Secondary | ICD-10-CM | POA: Diagnosis not present

## 2021-03-31 DIAGNOSIS — H02834 Dermatochalasis of left upper eyelid: Secondary | ICD-10-CM | POA: Diagnosis not present

## 2021-03-31 DIAGNOSIS — Z961 Presence of intraocular lens: Secondary | ICD-10-CM | POA: Diagnosis not present

## 2021-03-31 DIAGNOSIS — H43813 Vitreous degeneration, bilateral: Secondary | ICD-10-CM | POA: Diagnosis not present

## 2021-03-31 DIAGNOSIS — E119 Type 2 diabetes mellitus without complications: Secondary | ICD-10-CM | POA: Diagnosis not present

## 2021-03-31 DIAGNOSIS — Z794 Long term (current) use of insulin: Secondary | ICD-10-CM | POA: Diagnosis not present

## 2021-03-31 LAB — HM DIABETES EYE EXAM

## 2021-04-26 DIAGNOSIS — G4733 Obstructive sleep apnea (adult) (pediatric): Secondary | ICD-10-CM | POA: Diagnosis not present

## 2021-04-29 ENCOUNTER — Encounter: Payer: Self-pay | Admitting: Family Medicine

## 2021-04-29 ENCOUNTER — Ambulatory Visit (INDEPENDENT_AMBULATORY_CARE_PROVIDER_SITE_OTHER): Payer: Medicare Other | Admitting: Family Medicine

## 2021-04-29 VITALS — BP 140/62 | HR 70 | Ht 66.0 in | Wt 228.0 lb

## 2021-04-29 DIAGNOSIS — Z794 Long term (current) use of insulin: Secondary | ICD-10-CM | POA: Diagnosis not present

## 2021-04-29 DIAGNOSIS — R809 Proteinuria, unspecified: Secondary | ICD-10-CM | POA: Diagnosis not present

## 2021-04-29 DIAGNOSIS — E1169 Type 2 diabetes mellitus with other specified complication: Secondary | ICD-10-CM

## 2021-04-29 DIAGNOSIS — E1129 Type 2 diabetes mellitus with other diabetic kidney complication: Secondary | ICD-10-CM

## 2021-04-29 DIAGNOSIS — E1159 Type 2 diabetes mellitus with other circulatory complications: Secondary | ICD-10-CM

## 2021-04-29 DIAGNOSIS — N401 Enlarged prostate with lower urinary tract symptoms: Secondary | ICD-10-CM

## 2021-04-29 DIAGNOSIS — H9193 Unspecified hearing loss, bilateral: Secondary | ICD-10-CM | POA: Diagnosis not present

## 2021-04-29 DIAGNOSIS — E785 Hyperlipidemia, unspecified: Secondary | ICD-10-CM | POA: Diagnosis not present

## 2021-04-29 DIAGNOSIS — I152 Hypertension secondary to endocrine disorders: Secondary | ICD-10-CM | POA: Diagnosis not present

## 2021-04-29 DIAGNOSIS — R351 Nocturia: Secondary | ICD-10-CM

## 2021-04-29 LAB — BAYER DCA HB A1C WAIVED: HB A1C (BAYER DCA - WAIVED): 6.9 % — ABNORMAL HIGH (ref 4.8–5.6)

## 2021-04-29 MED ORDER — DONEPEZIL HCL 5 MG PO TABS: 5.0000 mg | ORAL_TABLET | Freq: Every day | ORAL | 3 refills | Status: DC

## 2021-04-29 MED ORDER — TAMSULOSIN HCL 0.4 MG PO CAPS: 0.4000 mg | ORAL_CAPSULE | Freq: Every day | ORAL | 3 refills | Status: DC

## 2021-04-29 MED ORDER — FAMOTIDINE 20 MG PO TABS: 20.0000 mg | ORAL_TABLET | Freq: Two times a day (BID) | ORAL | 3 refills | Status: DC

## 2021-04-29 MED ORDER — PAROXETINE HCL 20 MG PO TABS: 20.0000 mg | ORAL_TABLET | Freq: Every day | ORAL | 3 refills | Status: DC

## 2021-04-29 MED ORDER — METFORMIN HCL ER 500 MG PO TB24: 1000.0000 mg | ORAL_TABLET | Freq: Two times a day (BID) | ORAL | 3 refills | Status: DC

## 2021-04-29 NOTE — Progress Notes (Addendum)
? ?BP 140/62   Pulse 70   Ht _0  (1.676 m)   Wt 228 lb (103.4 kg)   SpO2 95%   BMI 36.80 kg/m?   ? ?Subjective:  ? ?Patient ID: [name], male    DOB: 05-Jun-1940, 81 y.o.   MRN: [id number] ? ?HPI: ?[name] is a 81 y.o. male presenting on 04/29/2021 for Medical Management of Chronic Issues and Diabetes ? ? ?HPI ?Type 2 diabetes mellitus ?Patient comes in today for recheck of his diabetes. Patient has been currently taking Basaglar and Humalog and metformin and Ozempic, he is getting occasional lows down in the 50s at night, he takes about once a week.  He does feel jittery and shaky when he gets like that.. Patient is not currently on an ACE inhibitor/ARB because of allergy. Patient has not seen an ophthalmologist this year. Patient denies any issues with their feet. The symptom started onset as an adult CAD and hypertension and hyperlipidemia and microalbuminuria ARE RELATED TO DM  ? ?Hypertension ?Patient is currently on amlodipine and chlorthalidone and furosemide and Imdur, and their blood pressure today is 140/62. Patient denies any lightheadedness or dizziness. Patient denies headaches, blurred vision, chest pains, shortness of breath, or weakness. Denies any side effects from medication and is content with current medication.  ? ?Hyperlipidemia ?Patient is coming in for recheck of his hyperlipidemia. The patient is currently taking Lipitor. They deny any issues with myalgias or history of liver damage from it. They deny any focal numbness or weakness or chest pain.  ? ?Relevant past medical, surgical, family and social history reviewed and updated as indicated. Interim medical history since our last visit reviewed. ?Allergies and medications reviewed and updated. ? ?Review of Systems  ?Constitutional:  Negative for chills and fever.  ?Eyes:  Negative for visual disturbance.  ?Respiratory:  Negative for shortness of breath and wheezing.   ?Cardiovascular:  Negative for chest pain and  leg swelling.  ?Skin:  Negative for rash.  ?Neurological:  Negative for dizziness.  ?All other systems reviewed and are negative. ? ?Per HPI unless specifically indicated above ? ? ?Allergies as of 04/29/2021   ? ?   Reactions  ? Ace Inhibitors Hives, Swelling, Rash  ? Rash,hives,tongue swelling  ? Angiotensin Receptor Blockers   ? Unknown reaction   ? Oxycodone-acetaminophen Other (See Comments)  ? Unknown reaction  ? Robaxin [methocarbamol] Other (See Comments)  ? Confusion   ? Toradol [ketorolac Tromethamine] Other (See Comments)  ? confusion  ? Valium Other (See Comments)  ? Hallucinations; confusion  ? Doxycycline Hives, Rash  ? ?  ? ?  ?Medication List  ?  ? ?  ? Accurate as of April 29, 2021  9:10 AM. If you have any questions, ask your nurse or doctor.  ?  ?  ? ?  ? ?acetaminophen 650 MG CR tablet ?Commonly known as: TYLENOL ?Take 1,300 mg by mouth 2 (two) times daily. ?  ?amLODipine 10 MG tablet ?Commonly known as: NORVASC ?TAKE 1 TABLET BY MOUTH  DAILY ?  ?aspirin EC 81 MG tablet ?Take 81 mg by mouth at bedtime. ?  ?atorvastatin 40 MG tablet ?Commonly known as: LIPITOR ?TAKE 1 TABLET BY MOUTH IN  THE EVENING ?What changed: when to take this ?  ?Basaglar KwikPen 100 UNIT/ML ?Inject 56 Units into the skin at bedtime. ?  ?butalbital-acetaminophen-caffeine 50-325-40 MG tablet ?Commonly known as: FIORICET ?Take 1 tablet by mouth 2 (two) times daily as needed for  headache. ?  ?cetirizine 10 MG tablet ?Commonly known as: ZYRTEC ?Take 10 mg by mouth daily with supper. ?  ?chlorthalidone 25 MG tablet ?Commonly known as: HYGROTON ?TAKE 1 TABLET BY MOUTH  DAILY ?  ?Dexcom G6 Receiver Devi ?USE TO CHECK BLOOD SUGAR UP TO 4 TIMES DAILY AS DIRECTED. 1 DEVICE PER YEAR. DX: E11.9 ?  ?Dexcom G6 Sensor Misc ?USE TO CHECK BLOOD SUGAR UP TO 4 TIMES DAILY AS DIRECTED. CHANGE SENSOR EVERY 30 DAYS. DX: E11.9 ?  ?Dexcom G6 Transmitter Misc ?USE TO CHECK BLOOD SUGAR UP TO 4 TIMES DAILY AS DIRECTED. CHANGE TRANSMITTER EVERY 3  MONTHS. DX: E11.9 ?  ?docusate sodium 100 MG capsule ?Commonly known as: COLACE ?Take 100 mg by mouth daily as needed for mild constipation or moderate constipation. ?  ?donepezil 5 MG tablet ?Commonly known as: ARICEPT ?Take 1 tablet (5 mg total) by mouth at bedtime. ?  ?famotidine 20 MG tablet ?Commonly known as: PEPCID ?Take 1 tablet (20 mg total) by mouth 2 (two) times daily with a meal. ?  ?Fish Oil 1200 MG Caps ?Take 1,200-2,400 mg by mouth See admin instructions. Tale 1200 mg in the morning and 2400 mg in the evening ?  ?furosemide 20 MG tablet ?Commonly known as: LASIX ?TAKE 1 TABLET BY MOUTH  DAILY AS NEEDED ?  ?insulin lispro 100 UNIT/ML KwikPen ?Commonly known as: HumaLOG KwikPen ?Inject 20 Units into the skin 3 (three) times daily with meals. ?  ?isosorbide mononitrate 30 MG 24 hr tablet ?Commonly known as: IMDUR ?TAKE 1 TABLET BY MOUTH IN  THE EVENING ?  ?metFORMIN 500 MG 24 hr tablet ?Commonly known as: GLUCOPHAGE-XR ?Take 2 tablets (1,000 mg total) by mouth 2 (two) times daily. ?  ?nitroGLYCERIN 0.4 MG SL tablet ?Commonly known as: NITROSTAT ?DISSOLVE ONE TABLET UNDER THE TONGUE EVERY 5 MINUTES AS NEEDED FOR CHEST PAIN.  DO NOT EXCEED A TOTAL OF 3 DOSES IN 15 MINUTES ?  ?OneTouch Ultra test strip ?Generic drug: glucose blood ?Check BS TID Dx E11.59 ?  ?PARoxetine 20 MG tablet ?Commonly known as: PAXIL ?Take 1 tablet (20 mg total) by mouth daily. ?  ?Pfizer-BioNT COVID-19 Vac-TriS Susp injection ?Generic drug: COVID-19 mRNA Vac-TriS [employer]) ?  ?Semaglutide (1 MG/DOSE) 4 MG/3ML Sopn ?Inject 1 mg as directed once a week. ?  ?tamsulosin 0.4 MG Caps capsule ?Commonly known as: FLOMAX ?Take 1 capsule (0.4 mg total) by mouth daily. ?  ?Turmeric Curcumin 500 MG Caps ?Take 500 mg by mouth daily. ?  ?vitamin B-12 1000 MCG tablet ?Commonly known as: CYANOCOBALAMIN ?Take 2,000 mcg by mouth daily. ?  ?Vitamin D3 50 MCG (2000 UT) capsule ?Take 4,000 Units by mouth daily. ?  ? ?  ? ? ? ?Objective:  ? ?BP 140/62    Pulse 70   Ht _0  (1.676 m)   Wt 228 lb (103.4 kg)   SpO2 95%   BMI 36.80 kg/m?   ?Wt Readings from Last 3 Encounters:  ?04/29/21 228 lb (103.4 kg)  ?02/12/21 231 lb 6.4 oz (105 kg)  ?01/22/21 231 lb (104.8 kg)  ?  ?Physical Exam ?Vitals and nursing note reviewed.  ?Constitutional:   ?   General: He is not in acute distress. ?   Appearance: He is well-developed. He is not diaphoretic.  ?Eyes:  ?   General: No scleral icterus.    ?   Right eye: No discharge.  ?   Conjunctiva/sclera: Conjunctivae normal.  ?   Pupils: Pupils are  equal, round, and reactive to light.  ?Neck:  ?   Thyroid: No thyromegaly.  ?Cardiovascular:  ?   Rate and Rhythm: Normal rate and regular rhythm.  ?   Heart sounds: Murmur heard.  ?Crescendo decrescendo systolic murmur is present with a grade of 2/6.  ?Pulmonary:  ?   Effort: Pulmonary effort is normal. No respiratory distress.  ?   Breath sounds: Normal breath sounds. No wheezing.  ?Musculoskeletal:     ?   General: Normal range of motion.  ?   Cervical back: Neck supple.  ?Lymphadenopathy:  ?   Cervical: No cervical adenopathy.  ?Skin: ?   General: Skin is warm and dry.  ?   Findings: No rash.  ?Neurological:  ?   Mental Status: He is alert and oriented to person, place, and time.  ?   Coordination: Coordination normal.  ?Psychiatric:     ?   Behavior: Behavior normal.  ? ? ?Results for orders placed or performed in visit on 04/09/21  ?HM DIABETES EYE EXAM  ?Result Value Ref Range  ? HM Diabetic Eye Exam No Retinopathy No Retinopathy  ? ? ?Assessment & Plan:  ? ?Problem List Items Addressed This Visit   ? ?  ? Cardiovascular and Mediastinum  ? Hypertension associated with diabetes ([place])  ? Relevant Medications  ? metFORMIN (GLUCOPHAGE-XR) 500 MG 24 hr tablet  ? Other Relevant Orders  ? CBC with Differential/Platelet  ? CMP14+EGFR  ? Lipid panel  ? PSA, total and free  ? Bayer DCA Hb A1c Waived  ?  ? Endocrine  ? Hyperlipidemia associated with type 2 diabetes mellitus ([place])  ? Relevant  Medications  ? metFORMIN (GLUCOPHAGE-XR) 500 MG 24 hr tablet  ? Other Relevant Orders  ? CBC with Differential/Platelet  ? CMP14+EGFR  ? Lipid panel  ? PSA, total and free  ? Bayer DCA Hb A1c Waived  ? Type 2 diabetes

## 2021-04-30 LAB — CBC WITH DIFFERENTIAL/PLATELET
Basophils Absolute: 0.1 10*3/uL (ref 0.0–0.2)
Basos: 1 %
EOS (ABSOLUTE): 0.1 10*3/uL (ref 0.0–0.4)
Eos: 2 %
Hematocrit: 39 % (ref 37.5–51.0)
Hemoglobin: 13.1 g/dL (ref 13.0–17.7)
Immature Grans (Abs): 0 10*3/uL (ref 0.0–0.1)
Immature Granulocytes: 0 %
Lymphocytes Absolute: 1.9 10*3/uL (ref 0.7–3.1)
Lymphs: 24 %
MCH: 28.5 pg (ref 26.6–33.0)
MCHC: 33.6 g/dL (ref 31.5–35.7)
MCV: 85 fL (ref 79–97)
Monocytes Absolute: 0.5 10*3/uL (ref 0.1–0.9)
Monocytes: 7 %
Neutrophils Absolute: 5 10*3/uL (ref 1.4–7.0)
Neutrophils: 66 %
Platelets: 265 10*3/uL (ref 150–450)
RBC: 4.59 x10E6/uL (ref 4.14–5.80)
RDW: 14.9 % (ref 11.6–15.4)
WBC: 7.6 10*3/uL (ref 3.4–10.8)

## 2021-04-30 LAB — CMP14+EGFR
ALT: 21 IU/L (ref 0–44)
AST: 19 IU/L (ref 0–40)
Albumin/Globulin Ratio: 1.5 (ref 1.2–2.2)
Albumin: 4.1 g/dL (ref 3.6–4.6)
Alkaline Phosphatase: 71 IU/L (ref 44–121)
BUN/Creatinine Ratio: 12 (ref 10–24)
BUN: 17 mg/dL (ref 8–27)
Bilirubin Total: 0.5 mg/dL (ref 0.0–1.2)
CO2: 21 mmol/L (ref 20–29)
Calcium: 9.2 mg/dL (ref 8.6–10.2)
Chloride: 100 mmol/L (ref 96–106)
Creatinine, Ser: 1.39 mg/dL — ABNORMAL HIGH (ref 0.76–1.27)
Globulin, Total: 2.7 g/dL (ref 1.5–4.5)
Glucose: 201 mg/dL — ABNORMAL HIGH (ref 70–99)
Potassium: 3.7 mmol/L (ref 3.5–5.2)
Sodium: 138 mmol/L (ref 134–144)
Total Protein: 6.8 g/dL (ref 6.0–8.5)
eGFR: 51 mL/min/{1.73_m2} — ABNORMAL LOW (ref >59)

## 2021-04-30 LAB — LIPID PANEL
Chol/HDL Ratio: 3.4 ratio (ref 0.0–5.0)
Cholesterol, Total: 114 mg/dL (ref 100–199)
HDL: 34 mg/dL — ABNORMAL LOW (ref >39)
LDL Chol Calc (NIH): 48 mg/dL (ref 0–99)
Triglycerides: 199 mg/dL — ABNORMAL HIGH (ref 0–149)
VLDL Cholesterol Cal: 32 mg/dL (ref 5–40)

## 2021-04-30 LAB — PSA, TOTAL AND FREE
PSA, Free Pct: 33.3 %
PSA, Free: 0.3 ng/mL
Prostate Specific Ag, Serum: 0.9 ng/mL (ref 0.0–4.0)

## 2021-05-08 DIAGNOSIS — Z794 Long term (current) use of insulin: Secondary | ICD-10-CM | POA: Diagnosis not present

## 2021-05-08 DIAGNOSIS — E1165 Type 2 diabetes mellitus with hyperglycemia: Secondary | ICD-10-CM | POA: Diagnosis not present

## 2021-05-19 ENCOUNTER — Other Ambulatory Visit: Payer: Self-pay | Admitting: Family Medicine

## 2021-05-19 ENCOUNTER — Other Ambulatory Visit: Payer: Self-pay | Admitting: *Deleted

## 2021-05-19 ENCOUNTER — Other Ambulatory Visit: Payer: Medicare Other

## 2021-05-19 ENCOUNTER — Other Ambulatory Visit: Payer: Self-pay | Admitting: Physician Assistant

## 2021-05-19 DIAGNOSIS — R7989 Other specified abnormal findings of blood chemistry: Secondary | ICD-10-CM | POA: Diagnosis not present

## 2021-05-19 DIAGNOSIS — E1159 Type 2 diabetes mellitus with other circulatory complications: Secondary | ICD-10-CM | POA: Diagnosis not present

## 2021-05-19 DIAGNOSIS — Z794 Long term (current) use of insulin: Secondary | ICD-10-CM | POA: Diagnosis not present

## 2021-05-19 LAB — BASIC METABOLIC PANEL
BUN/Creatinine Ratio: 13 (ref 10–24)
BUN: 15 mg/dL (ref 8–27)
CO2: 23 mmol/L (ref 20–29)
Calcium: 9.7 mg/dL (ref 8.6–10.2)
Chloride: 96 mmol/L (ref 96–106)
Creatinine, Ser: 1.18 mg/dL (ref 0.76–1.27)
Glucose: 185 mg/dL — ABNORMAL HIGH (ref 70–99)
Potassium: 4 mmol/L (ref 3.5–5.2)
Sodium: 137 mmol/L (ref 134–144)
eGFR: 62 mL/min/{1.73_m2} (ref >59)

## 2021-05-19 NOTE — Telephone Encounter (Signed)
This is Dr. Myles Gip pt ?

## 2021-05-20 LAB — MICROALBUMIN / CREATININE URINE RATIO
Creatinine, Urine: 190.8 mg/dL
Microalb/Creat Ratio: 139 mg/g creat — ABNORMAL HIGH (ref 0–29)
Microalbumin, Urine: 265.3 ug/mL

## 2021-05-24 ENCOUNTER — Other Ambulatory Visit: Payer: Self-pay | Admitting: Family Medicine

## 2021-05-25 ENCOUNTER — Telehealth: Payer: Self-pay

## 2021-05-25 NOTE — Telephone Encounter (Signed)
Pt's wife left message inquiring about a new Dexcom G6... wanted to know if he is eligible for the Dexcom 7. His G6 receiver has cracked/shattered on the front. If he cant get the Dexcom 7, was wondering if she can get a new receiver.  ? ?Please call back ?

## 2021-05-26 NOTE — Telephone Encounter (Signed)
Per order below, it states patient got the [place] in February.  She needs to call [facility] to see what is going on. ?[phone] ? ?Thank you! ? ? ?

## 2021-05-29 NOTE — Telephone Encounter (Signed)
Pt's wife left message. She called [employer] & was told they did not send the [place] & that Mr Cordial is still on the G6. She was also told medicare does not pay for the receiver except every 5 year... he has had his since 2021 & he will have to wait. Pt's wife says she hopes his meter will last 3 more years since his is shattered/cracked. She doesn't know what else to do but wanted to follow up with what she was told.

## 2021-06-08 DIAGNOSIS — Z794 Long term (current) use of insulin: Secondary | ICD-10-CM | POA: Diagnosis not present

## 2021-06-08 DIAGNOSIS — E1165 Type 2 diabetes mellitus with hyperglycemia: Secondary | ICD-10-CM | POA: Diagnosis not present

## 2021-06-23 ENCOUNTER — Telehealth: Payer: Self-pay | Admitting: Family Medicine

## 2021-06-23 NOTE — Telephone Encounter (Signed)
I doubt it is the Ozempic causing the symptoms, has he been checking his blood sugars, are they running low or are they running too high?.  If he does want to try off the Ozempic for 2 weeks and let me know how it goes and that is fine.  Let me know how he is doing after 2 weeks but I doubt it is the Ozempic and if it does not then he needs to get in here and be evaluated again.

## 2021-06-23 NOTE — Telephone Encounter (Signed)
Pt informed and understood. States that his blood sugars have been normal. He will call back in 2 weeks and schedule an appt if not feeling any better.

## 2021-06-23 NOTE — Telephone Encounter (Signed)
Pt has no desire to do anything. He has no energy. Started about 22mago when he started Ozempic. He continues to take Insulin and his Metformin. Pt is due for injection today. He is thinking of staying off of the [place] the next two weeks to see if he improves.

## 2021-07-09 DIAGNOSIS — Z794 Long term (current) use of insulin: Secondary | ICD-10-CM | POA: Diagnosis not present

## 2021-07-09 DIAGNOSIS — E1165 Type 2 diabetes mellitus with hyperglycemia: Secondary | ICD-10-CM | POA: Diagnosis not present

## 2021-07-26 DIAGNOSIS — G4733 Obstructive sleep apnea (adult) (pediatric): Secondary | ICD-10-CM | POA: Diagnosis not present

## 2021-07-29 ENCOUNTER — Other Ambulatory Visit: Payer: Self-pay

## 2021-07-29 ENCOUNTER — Ambulatory Visit (INDEPENDENT_AMBULATORY_CARE_PROVIDER_SITE_OTHER): Payer: Medicare Other | Admitting: Family Medicine

## 2021-07-29 ENCOUNTER — Encounter: Payer: Self-pay | Admitting: Family Medicine

## 2021-07-29 VITALS — BP 125/68 | HR 77 | Temp 98.0°F | Ht 66.0 in | Wt 228.0 lb

## 2021-07-29 DIAGNOSIS — Z794 Long term (current) use of insulin: Secondary | ICD-10-CM

## 2021-07-29 DIAGNOSIS — R809 Proteinuria, unspecified: Secondary | ICD-10-CM

## 2021-07-29 DIAGNOSIS — E785 Hyperlipidemia, unspecified: Secondary | ICD-10-CM

## 2021-07-29 DIAGNOSIS — I152 Hypertension secondary to endocrine disorders: Secondary | ICD-10-CM | POA: Diagnosis not present

## 2021-07-29 DIAGNOSIS — E1129 Type 2 diabetes mellitus with other diabetic kidney complication: Secondary | ICD-10-CM

## 2021-07-29 DIAGNOSIS — E1169 Type 2 diabetes mellitus with other specified complication: Secondary | ICD-10-CM

## 2021-07-29 DIAGNOSIS — E1159 Type 2 diabetes mellitus with other circulatory complications: Secondary | ICD-10-CM

## 2021-07-29 LAB — BAYER DCA HB A1C WAIVED: HB A1C (BAYER DCA - WAIVED): 7.7 % — ABNORMAL HIGH (ref 4.8–5.6)

## 2021-07-29 MED ORDER — TAMSULOSIN HCL 0.4 MG PO CAPS: 0.4000 mg | ORAL_CAPSULE | Freq: Every day | ORAL | 3 refills | Status: DC

## 2021-07-29 MED ORDER — FAMOTIDINE 20 MG PO TABS: 20.0000 mg | ORAL_TABLET | Freq: Two times a day (BID) | ORAL | 3 refills | Status: DC

## 2021-07-29 MED ORDER — METFORMIN HCL ER 500 MG PO TB24: 1000.0000 mg | ORAL_TABLET | Freq: Two times a day (BID) | ORAL | 3 refills | Status: DC

## 2021-07-29 MED ORDER — INSULIN LISPRO (1 UNIT DIAL) 100 UNIT/ML (KWIKPEN): 20.0000 [IU] | PEN_INJECTOR | Freq: Three times a day (TID) | SUBCUTANEOUS | 11 refills | Status: DC

## 2021-07-29 MED ORDER — DONEPEZIL HCL 5 MG PO TABS: 5.0000 mg | ORAL_TABLET | Freq: Every day | ORAL | 3 refills | Status: DC

## 2021-07-29 MED ORDER — DEXCOM G6 SENSOR MISC: 4 refills | Status: DC

## 2021-07-29 MED ORDER — PAROXETINE HCL 20 MG PO TABS
20.0000 mg | ORAL_TABLET | Freq: Every day | ORAL | 3 refills | Status: DC
Start: 2021-07-29 — End: 2022-05-24

## 2021-07-29 NOTE — Progress Notes (Signed)
BP 125/68   Pulse 77   Temp 98 F (36.7 C)   Ht '5\' 6"'$  (1.676 m)   Wt 228 lb (103.4 kg)   SpO2 99%   BMI 36.80 kg/m    Subjective:   Patient ID: [name], male    DOB: 07-26-1940, 81 y.o.   MRN: [id number]  HPI: [name] is a 81 y.o. male presenting on 07/29/2021 for Medical Management of Chronic Issues and Diabetes   HPI Type 2 diabetes mellitus Patient comes in today for recheck of his diabetes. Patient has been currently taking metformin and Humalog and Basaglar. Patient is not currently on an ACE inhibitor/ARB, because of allergy to ACEs and ARB's. Patient has not seen an ophthalmologist this year. Patient denies any new issues with their feet. The symptom started onset as an adult hyperlipidemia and hypertension and CAD and microalbuminuria ARE RELATED TO DM.  Patient said that he stopped the Ozempic a month ago for a few weeks because he felt like it was decreasing his energy but he also had aortic valve that he still recovering from that was replaced.  He says he started back up just over a week ago and has been feeling fine  Hypertension Patient is currently on chlorthalidone and amlodipine and furosemide and Imdur, and their blood pressure today is 125/68. Patient denies any lightheadedness or dizziness. Patient denies headaches, blurred vision, chest pains, shortness of breath, or weakness. Denies any side effects from medication and is content with current medication.   Hyperlipidemia Patient is coming in for recheck of his hyperlipidemia. The patient is currently taking fish oil and atorvastatin. They deny any issues with myalgias or history of liver damage from it. They deny any focal numbness or weakness or chest pain.   Relevant past medical, surgical, family and social history reviewed and updated as indicated. Interim medical history since our last visit reviewed. Allergies and medications reviewed and updated.  Review of Systems  Constitutional:   Negative for chills and fever.  Eyes:  Negative for visual disturbance.  Respiratory:  Negative for shortness of breath and wheezing.   Cardiovascular:  Negative for chest pain and leg swelling.  Musculoskeletal:  Negative for back pain and gait problem.  Skin:  Negative for rash.  Neurological:  Negative for dizziness, weakness and light-headedness.  All other systems reviewed and are negative.   Per HPI unless specifically indicated above   Allergies as of 07/29/2021       Reactions   Ace Inhibitors Hives, Swelling, Rash   Rash,hives,tongue swelling   Angiotensin Receptor Blockers    Unknown reaction    Oxycodone-acetaminophen Other (See Comments)   Unknown reaction   Robaxin [methocarbamol] Other (See Comments)   Confusion    Toradol [ketorolac Tromethamine] Other (See Comments)   confusion   Valium Other (See Comments)   Hallucinations; confusion   Doxycycline Hives, Rash        Medication List        Accurate as of July 29, 2021  9:07 AM. If you have any questions, ask your nurse or doctor.          acetaminophen 650 MG CR tablet Commonly known as: TYLENOL Take 1,300 mg by mouth 2 (two) times daily.   amLODipine 10 MG tablet Commonly known as: NORVASC TAKE 1 TABLET BY MOUTH  DAILY   aspirin EC 81 MG tablet Take 81 mg by mouth at bedtime.   atorvastatin 40 MG tablet Commonly known as:  LIPITOR TAKE 1 TABLET BY MOUTH IN  THE EVENING   Basaglar KwikPen 100 UNIT/ML Inject 56 Units into the skin at bedtime.   butalbital-acetaminophen-caffeine 50-325-40 MG tablet Commonly known as: FIORICET Take 1 tablet by mouth 2 (two) times daily as needed for headache.   cetirizine 10 MG tablet Commonly known as: ZYRTEC Take 10 mg by mouth daily with supper.   chlorthalidone 25 MG tablet Commonly known as: HYGROTON TAKE 1 TABLET BY MOUTH  DAILY   Dexcom G6 Receiver Devi USE TO CHECK BLOOD SUGAR UP TO 4 TIMES DAILY AS DIRECTED. 1 DEVICE PER YEAR. DX: E11.9    Dexcom G6 Sensor Misc USE TO CHECK BLOOD SUGAR UP TO 4 TIMES DAILY AS DIRECTED. CHANGE SENSOR EVERY 30 DAYS. DX: E11.9   Dexcom G6 Transmitter Misc USE TO CHECK BLOOD SUGAR UP TO 4 TIMES DAILY AS DIRECTED. CHANGE TRANSMITTER EVERY 3 MONTHS. DX: E11.9   docusate sodium 100 MG capsule Commonly known as: COLACE Take 100 mg by mouth daily as needed for mild constipation or moderate constipation.   donepezil 5 MG tablet Commonly known as: ARICEPT Take 1 tablet (5 mg total) by mouth at bedtime.   famotidine 20 MG tablet Commonly known as: PEPCID Take 1 tablet (20 mg total) by mouth 2 (two) times daily with a meal.   Fish Oil 1200 MG Caps Take 1,200-2,400 mg by mouth See admin instructions. Tale 1200 mg in the morning and 2400 mg in the evening   furosemide 20 MG tablet Commonly known as: LASIX TAKE 1 TABLET BY MOUTH  DAILY AS NEEDED   insulin lispro 100 UNIT/ML KwikPen Commonly known as: HumaLOG KwikPen Inject 20 Units into the skin 3 (three) times daily with meals.   isosorbide mononitrate 30 MG 24 hr tablet Commonly known as: IMDUR TAKE 1 TABLET BY MOUTH IN  THE EVENING   metFORMIN 500 MG 24 hr tablet Commonly known as: GLUCOPHAGE-XR Take 2 tablets (1,000 mg total) by mouth 2 (two) times daily.   nitroGLYCERIN 0.4 MG SL tablet Commonly known as: NITROSTAT DISSOLVE ONE TABLET UNDER THE TONGUE EVERY 5 MINUTES AS NEEDED FOR CHEST PAIN.  DO NOT EXCEED A TOTAL OF 3 DOSES IN 15 MINUTES   OneTouch Ultra test strip Generic drug: glucose blood Check BS TID Dx E11.59   PARoxetine 20 MG tablet Commonly known as: PAXIL Take 1 tablet (20 mg total) by mouth daily.   Pfizer-BioNT COVID-19 Vac-TriS Susp injection Generic drug: COVID-19 mRNA Vac-TriS (Pfizer)   Semaglutide (1 MG/DOSE) 4 MG/3ML Sopn Inject 1 mg as directed once a week.   tamsulosin 0.4 MG Caps capsule Commonly known as: FLOMAX Take 1 capsule (0.4 mg total) by mouth daily.   Turmeric Curcumin 500 MG Caps Take  500 mg by mouth daily.   vitamin B-12 1000 MCG tablet Commonly known as: CYANOCOBALAMIN Take 2,000 mcg by mouth daily.   Vitamin D3 50 MCG (2000 UT) capsule Take 4,000 Units by mouth daily.         Objective:   BP 125/68   Pulse 77   Temp 98 F (36.7 C)   Ht '5\' 6"'$  (1.676 m)   Wt 228 lb (103.4 kg)   SpO2 99%   BMI 36.80 kg/m   Wt Readings from Last 3 Encounters:  07/29/21 228 lb (103.4 kg)  04/29/21 228 lb (103.4 kg)  02/12/21 231 lb 6.4 oz (105 kg)    Physical Exam Vitals and nursing note reviewed.  Constitutional:  General: He is not in acute distress.    Appearance: He is well-developed. He is not diaphoretic.  Eyes:     General: No scleral icterus.    Conjunctiva/sclera: Conjunctivae normal.  Neck:     Thyroid: No thyromegaly.  Cardiovascular:     Rate and Rhythm: Normal rate and regular rhythm.     Heart sounds: Normal heart sounds. No murmur heard. Pulmonary:     Effort: Pulmonary effort is normal. No respiratory distress.     Breath sounds: Normal breath sounds. No wheezing.  Musculoskeletal:        General: Normal range of motion.     Cervical back: Neck supple.  Lymphadenopathy:     Cervical: No cervical adenopathy.  Skin:    General: Skin is warm and dry.     Findings: No rash.  Neurological:     Mental Status: He is alert and oriented to person, place, and time.     Coordination: Coordination normal.  Psychiatric:        Behavior: Behavior normal.       Assessment & Plan:   Problem List Items Addressed This Visit       Cardiovascular and Mediastinum   Hypertension associated with diabetes ([place])   Relevant Medications   insulin lispro (HUMALOG KWIKPEN) 100 UNIT/ML KwikPen   metFORMIN (GLUCOPHAGE-XR) 500 MG 24 hr tablet     Endocrine   Hyperlipidemia associated with type 2 diabetes mellitus ([facility])   Relevant Medications   insulin lispro (HUMALOG KWIKPEN) 100 UNIT/ML KwikPen   metFORMIN (GLUCOPHAGE-XR) 500 MG 24 hr tablet    Type 2 diabetes mellitus with circulatory disorder ([facility]) - Primary   Relevant Medications   insulin lispro (HUMALOG KWIKPEN) 100 UNIT/ML KwikPen   metFORMIN (GLUCOPHAGE-XR) 500 MG 24 hr tablet   Other Relevant Orders   Bayer DCA Hb A1c Waived   Microalbuminuria due to type 2 diabetes mellitus ([facility])   Relevant Medications   insulin lispro (HUMALOG KWIKPEN) 100 UNIT/ML KwikPen   metFORMIN (GLUCOPHAGE-XR) 500 MG 24 hr tablet    A1c up at 7.7, patient has already restarted his Ozempic just over a week ago, continue with current dose but may want to increase the Ozempic in the future but want to see if he tolerates it a little bit better energy wise this time.  Instructed patient to focus a little more on diet and keep the Ozempic going but may want to increase in the future, also instructed patient to make an appointment with Lottie Dawson clinical pharmacist. Follow up plan: Return in about 3 months (around 10/29/2021), or if symptoms worsen or fail to improve, for Diabetes recheck.  Counseling provided for all of the vaccine components Orders Placed This Encounter  Procedures   Bayer [place] Hb A1c Corydon Jemina Scahill, MD [facility] 07/29/2021, 9:07 AM

## 2021-08-03 ENCOUNTER — Encounter: Payer: Self-pay | Admitting: Family Medicine

## 2021-08-04 ENCOUNTER — Telehealth: Payer: Self-pay | Admitting: Pharmacist

## 2021-08-04 NOTE — Telephone Encounter (Signed)
OZEMPIC INCREASE VIA [facility] PATIENT ASSISTANCE PROGRAM  WILL INCREASE TO '2MG'$  WEEKLY PER PATIENT GOALS  WILL SHIP TO PCP OFFICE IN 14-21 BUSINESS DAYS  FAXED TO [phone] PATIENT CAN CALL [phone] TO CHECK ON STATUS

## 2021-08-05 ENCOUNTER — Telehealth: Payer: Self-pay

## 2021-08-05 NOTE — Chronic Care Management (AMB) (Signed)
  Chronic Care Management   Note  08/05/2021 Name: [name] MRN: [id number] DOB: [birth year]  [name] is a 81 y.o. year old male who is a primary care patient of Dettinger, Fransisca Kaufmann, MD. I reached out to [name] by phone today in response to a referral sent by [name] PCP.  [name] was given information about Chronic Care Management services today including:  CCM service includes personalized support from designated clinical staff supervised by his physician, including individualized plan of care and coordination with other care providers 24/7 contact phone numbers for assistance for urgent and routine care needs. Service will only be billed when office clinical staff spend 20 minutes or more in a month to coordinate care. Only one practitioner may furnish and bill the service in a calendar month. The patient may stop CCM services at any time (effective at the end of the month) by phone call to the office staff. The patient is responsible for co-pay (up to 20% after annual deductible is met) if co-pay is required by the individual health plan.   Patient agreed to services and verbal consent obtained.   Follow up plan: Face to Face appointment with care management team member scheduled for: 09/02/2021  [name], [place], [place] [address] Direct Dial: [phone] [name]

## 2021-08-11 DIAGNOSIS — E1165 Type 2 diabetes mellitus with hyperglycemia: Secondary | ICD-10-CM | POA: Diagnosis not present

## 2021-08-11 DIAGNOSIS — Z794 Long term (current) use of insulin: Secondary | ICD-10-CM | POA: Diagnosis not present

## 2021-08-13 DIAGNOSIS — Z794 Long term (current) use of insulin: Secondary | ICD-10-CM | POA: Diagnosis not present

## 2021-08-13 DIAGNOSIS — E1165 Type 2 diabetes mellitus with hyperglycemia: Secondary | ICD-10-CM | POA: Diagnosis not present

## 2021-08-17 ENCOUNTER — Telehealth: Payer: Self-pay | Admitting: Family Medicine

## 2021-08-17 DIAGNOSIS — Z794 Long term (current) use of insulin: Secondary | ICD-10-CM

## 2021-08-17 MED ORDER — DEXCOM G6 RECEIVER DEVI: 1 refills | Status: DC

## 2021-08-17 NOTE — Telephone Encounter (Signed)
Refill sent to [facility]. Informed that insurance may not cover.  Will route to Almyra Free to make aware and to make sure nothing else needs to be completed.  Wife made aware and aware that Almyra Free is out of the office today.

## 2021-08-18 MED ORDER — DEXCOM G6 RECEIVER DEVI: 1 refills | Status: DC

## 2021-08-18 NOTE — Telephone Encounter (Signed)
I would have patient/wife call [facility] (where they get dexcom filled) I honestly don't know how to get a new one covered because medicare will only cover one device/reader every 5 years  I also sent dexcom receiver prescription to [facility], but it is unlikely they will fill If able/compatible, patient should use smart phone as the "reader"

## 2021-08-18 NOTE — Telephone Encounter (Signed)
Wife made aware of all. She will call [facility] first and see what they tell her. If not able to get another receiver they will try with pts phone again. Wife believes they had tried to use pts phone before and could not get it to work.  Pt has a follow up with Almyra Free at the end of the month and will discuss next steps. Wanting to see if pt could go ahead and start on Decom 7

## 2021-08-18 NOTE — Progress Notes (Unsigned)
Cardiology Office Note  Date: 08/19/2021   ID: [name], DOB 01/04/1941, MRN [id number]  PCP:  Dettinger, Fransisca Kaufmann, MD  Cardiologist:  Rozann Lesches, MD Electrophysiologist:  None   Chief Complaint  Patient presents with   Cardiac follow-up    History of Present Illness: [name] is an 81 y.o. male last seen in February.  He is here for a follow-up visit.  Reports NYHA class II dyspnea with most activities.  He does have orthostatic lightheadedness at times and feels somewhat dizzy when he is up doing chores.  He has not had any angina, no palpitations or sudden syncope.  Last echocardiogram was in December 2022 at which point 26 mm [facility] TAVR was functioning normally with mean gradient 10 mmHg and mild paravalvular regurgitation.  I reviewed his medications.  We discussed reducing Norvasc dose but otherwise continuing present cardiac regimen.  I personally reviewed his ECG today which shows normal sinus rhythm.  Past Medical History:  Diagnosis Date   Anxiety    Aortic atherosclerosis ([place]) 12/19/2017   Aortic stenosis    26 mm Edwards S3U THV November 2021   Arthritis    Bursitis of hip    Cervical spondylosis    Chronic headaches    Coronary atherosclerosis of native coronary artery    BMS nondominant RCA 12/2004, DES x2 to mid LAD 11/2019   Essential hypertension    GERD (gastroesophageal reflux disease)    History of adenomatous polyp of colon    History of kidney stones    History of MI (myocardial infarction) 12/2004   History of viral meningitis 02/28/2018   December 2019   Hyperlipidemia    Internal hemorrhoids    Low back pain    Nocturia more than twice per night    OSA (obstructive sleep apnea)    Spinal stenosis    Type 2 diabetes mellitus treated with insulin ([name])    UTI (urinary tract infection)    Wears dentures    Upper    Past Surgical History:  Procedure Laterality Date   CARDIAC CATHETERIZATION  06-14-2004   dr Lia Foyer   total occlusion mD1, RI 30-40%, mRCA nondominant hazy 80% and scattered 30-40%,  ef 71% (medical mangement)   CARDIOVASCULAR STRESS TEST  09-17-2015   dr Domenic Polite   Low risk nuclear study w/ reversible mild small anteroapical defect,  normal LV function and wall motion, nuclear stress ef 61%   CATARACT EXTRACTION W/ INTRAOCULAR LENS IMPLANT Right 07/2014   COLONOSCOPY  09/10/2008   normal   CORONARY ANGIOPLASTY WITH STENT PLACEMENT  12-17-2004   dr benismhon/ dr Olevia Perches   nonobstructive cad LAD and LCFx,  BMS x1 to total occlusion RCA    CORONARY ATHERECTOMY  11/22/2019   CORONARY ATHERECTOMY N/A 11/22/2019   Procedure: CORONARY ATHERECTOMY;  Surgeon: Burnell Blanks, MD;  Location: [place] [facility];  Service: Cardiovascular;  Laterality: N/A;   CYSTOSCOPY W/ URETEROSCOPY W/ LITHOTRIPSY  08/2010   ERCP  03/14/2008   EYE SURGERY Bilateral    cataracts   LAPAROSCOPIC CHOLECYSTECTOMY  05/2012   LEFT HEART CATHETERIZATION WITH CORONARY ANGIOGRAM N/A 05/14/2011   Procedure: LEFT HEART CATHETERIZATION WITH CORONARY ANGIOGRAM;  Surgeon: Sherren Mocha, MD;  Location: [facility] C[facility];  Service: Cardiovascular;  Laterality: N/A;    non-obstructive LM, LAD, LCFx, patent RCA stent   LUMBAR LAMINECTOMY/DECOMPRESSION MICRODISCECTOMY N/A 02/24/2017   Procedure: Microlumbar decompression L4-5, L5-S1;  Surgeon: Susa Day, MD;  Location:  [facility];  Service: Orthopedics;  Laterality: N/A;  120 mins   RIGHT/LEFT HEART CATH AND CORONARY ANGIOGRAPHY N/A 11/14/2019   Procedure: RIGHT/LEFT HEART CATH AND CORONARY ANGIOGRAPHY;  Surgeon: Burnell Blanks, MD;  Location: [place] [facility];  Service: Cardiovascular;  Laterality: N/A;   ROTATOR CUFF REPAIR Right 03/2002   ROTATOR CUFF REPAIR Left 12/11/2015   TEE WITHOUT CARDIOVERSION N/A 12/04/2019   Procedure: TRANSESOPHAGEAL ECHOCARDIOGRAM (TEE);  Surgeon: Burnell Blanks, MD;  Location: [place] [facility];  Service: Open Heart  Surgery;  Laterality: N/A;   TOTAL HIP ARTHROPLASTY Right 12/20/2008   TOTAL HIP ARTHROPLASTY Left 07/05/2018   Procedure: TOTAL HIP ARTHROPLASTY ANTERIOR APPROACH;  Surgeon: Gaynelle Arabian, MD;  Location: [facility];  Service: Orthopedics;  Laterality: Left;  147mn   TOTAL KNEE ARTHROPLASTY Bilateral left 12-11-2003/  right [facility]31-2004   [facility]  TRANSCATHETER AORTIC VALVE REPLACEMENT, TRANSFEMORAL N/A 12/04/2019   Procedure: TRANSCATHETER AORTIC VALVE REPLACEMENT, TRANSFEMORAL;  Surgeon: MBurnell Blanks MD;  [facility];  Service: Open Heart Surgery;  Laterality: N/A;   UPPER GASTROINTESTINAL ENDOSCOPY  01/08/2008   bx, inlet patch, duodenitis   YAG LASER APPLICATION Right 70/30/0923  Procedure: YAG LASER APPLICATION;  Surgeon: CWilliams Che MD;  Location: [facility];  Service: Ophthalmology;  Laterality: Right;    Current Outpatient Medications  Medication Sig Dispense Refill   acetaminophen (TYLENOL) 650 MG CR tablet Take 1,300 mg by mouth 2 (two) times daily.     aspirin EC 81 MG tablet Take 81 mg by mouth at bedtime.      atorvastatin (LIPITOR) 40 MG tablet TAKE 1 TABLET BY MOUTH IN  THE EVENING 90 tablet 3   butalbital-acetaminophen-caffeine (FIORICET) 50-325-40 MG tablet Take 1 tablet by mouth 2 (two) times daily as needed for headache.     cetirizine (ZYRTEC) 10 MG tablet Take 10 mg by mouth daily with supper.      chlorthalidone (HYGROTON) 25 MG tablet TAKE 1 TABLET BY MOUTH  DAILY 90 tablet 3   Cholecalciferol (VITAMIN D3) 50 MCG (2000 UT) capsule Take 4,000 Units by mouth daily.      Continuous Blood Gluc Receiver (DEXCOM G6 RECEIVER) DEVI USE TO CHECK BLOOD SUGAR UP TO 4 TIMES DAILY AS DIRECTED. 1 DEVICE PER YEAR. DX: E11.9 1 each 1   Continuous Blood Gluc Sensor (DEXCOM G6 SENSOR) MISC USE TO CHECK BLOOD SUGAR UP TO 4 TIMES DAILY AS DIRECTED. CHANGE SENSOR EVERY 30 DAYS. DX: E11.9 3 each 4   Continuous Blood Gluc Transmit (DEXCOM G6 TRANSMITTER)  MISC USE TO CHECK BLOOD SUGAR UP TO 4 TIMES DAILY AS DIRECTED. CHANGE TRANSMITTER EVERY 3 MONTHS. DX: E11.9 1 each 12   docusate sodium (COLACE) 100 MG capsule Take 100 mg by mouth daily as needed for mild constipation or moderate constipation.      donepezil (ARICEPT) 5 MG tablet Take 1 tablet (5 mg total) by mouth at bedtime. 90 tablet 3   famotidine (PEPCID) 20 MG tablet Take 1 tablet (20 mg total) by mouth 2 (two) times daily with a meal. 180 tablet 3   furosemide (LASIX) 20 MG tablet TAKE 1 TABLET BY MOUTH  DAILY AS NEEDED 90 tablet 3   glucose blood (ONETOUCH ULTRA) test strip Check BS TID Dx E11.59 400 each 3   Insulin Glargine (BASAGLAR KWIKPEN) 100 UNIT/ML Inject 56 Units into the skin at bedtime. 15 mL 3   insulin lispro (HUMALOG KWIKPEN) 100 UNIT/ML  KwikPen Inject 20 Units into the skin 3 (three) times daily with meals. 45 mL 11   isosorbide mononitrate (IMDUR) 30 MG 24 hr tablet TAKE 1 TABLET BY MOUTH IN  THE EVENING 90 tablet 3   metFORMIN (GLUCOPHAGE-XR) 500 MG 24 hr tablet Take 2 tablets (1,000 mg total) by mouth 2 (two) times daily. 360 tablet 3   nitroGLYCERIN (NITROSTAT) 0.4 MG SL tablet DISSOLVE ONE TABLET UNDER THE TONGUE EVERY 5 MINUTES AS NEEDED FOR CHEST PAIN.  DO NOT EXCEED A TOTAL OF 3 DOSES IN 15 MINUTES 25 tablet 3   Omega-3 Fatty Acids (FISH OIL) 1200 MG CAPS Take 1,200-2,400 mg by mouth See admin instructions. Tale 1200 mg in the morning and 2400 mg in the evening     PARoxetine (PAXIL) 20 MG tablet Take 1 tablet (20 mg total) by mouth daily. 90 tablet 3   PFIZER-BIONT COVID-19 VAC-TRIS SUSP injection      Semaglutide, 1 MG/DOSE, 4 MG/3ML SOPN Inject 1 mg as directed once a week. 9 mL 3   tamsulosin (FLOMAX) 0.4 MG CAPS capsule Take 1 capsule (0.4 mg total) by mouth daily. 90 capsule 3   Turmeric Curcumin 500 MG CAPS Take 500 mg by mouth daily.     vitamin B-12 (CYANOCOBALAMIN) 1000 MCG tablet Take 2,000 mcg by mouth daily.     amLODipine (NORVASC) 5 MG tablet Take 1  tablet (5 mg total) by mouth daily. 90 tablet 1   No current facility-administered medications for this visit.   Allergies:  Ace inhibitors, Angiotensin receptor blockers, Oxycodone-acetaminophen, Robaxin [methocarbamol], Toradol [ketorolac tromethamine], Valium, and Doxycycline   ROS: Chronic back pain.  No orthopnea or PND.  Physical Exam: VS:  BP 132/60   Pulse 74   Ht '5\' 6"'$  (1.676 m)   Wt 229 lb 9.6 oz (104.1 kg)   SpO2 95%   BMI 37.06 kg/m , BMI Body mass index is 37.06 kg/m.  Wt Readings from Last 3 Encounters:  08/19/21 229 lb 9.6 oz (104.1 kg)  07/29/21 228 lb (103.4 kg)  04/29/21 228 lb (103.4 kg)    General: Patient appears comfortable at rest. HEENT: Conjunctiva and lids normal, oropharynx clear. Neck: Supple, no elevated JVP or carotid bruits, no thyromegaly. Lungs: Clear to auscultation, nonlabored breathing at rest. Cardiac: Regular rate and rhythm, no S3, 2/6 systolic murmur, no pericardial rub. Extremities: No pitting edema.  ECG:  An ECG dated 07/01/2020 was personally reviewed today and demonstrated:  Sinus rhythm with left atrial enlargement, IVCD.  Recent Labwork: 04/29/2021: ALT 21; AST 19; Hemoglobin 13.1; Platelets 265 05/19/2021: BUN 15; Creatinine, Ser 1.18; Potassium 4.0; Sodium 137     Component Value Date/Time   CHOL 114 04/29/2021 0827   CHOL 139 06/02/2012 1007   TRIG 199 (H) 04/29/2021 0827   TRIG 185 (H) 04/10/2013 0937   TRIG 107 06/02/2012 1007   HDL 34 (L) 04/29/2021 0827   HDL 34 (L) 04/10/2013 0937   HDL 40 06/02/2012 1007   CHOLHDL 3.4 04/29/2021 0827   CHOLHDL 3.8 11/16/2019 0206   VLDL 25 11/16/2019 0206   LDLCALC 48 04/29/2021 0827   LDLCALC 62 04/10/2013 0937   LDLCALC 78 06/02/2012 1007    Other Studies Reviewed Today:  Echocardiogram 12/11/2020:  1. Left ventricular ejection fraction, by estimation, is 60 to 65%. The  left ventricle has normal function. The left ventricle has no regional  wall motion abnormalities. There  is mild concentric left ventricular  hypertrophy. Left ventricular diastolic  parameters are consistent with Grade I diastolic dysfunction (impaired  relaxation). The average left ventricular global longitudinal strain is  18.5 %. The global longitudinal strain is normal.   2. Right ventricular systolic function is normal. The right ventricular  size is normal. There is normal pulmonary artery systolic pressure. The  estimated right ventricular systolic pressure is 96.2 mmHg.   3. The mitral valve is degenerative. Trivial mitral valve regurgitation.   4. The aortic valve has been repaired/replaced. There is a 30m Edwards  [place] TAVR valve present in the aortic position. Echo findings are  consistent with normal structure and function of the aortic valve  prosthesis. Mean gradient 167mg, Vmax 2.2 m/s,   DI 0.41. There is mild paravalvular leak.   5. Aortic dilatation noted. There is mild dilatation of the aortic root,  measuring 42 mm. There is mild dilatation of the ascending aorta,  measuring 43 mm.   6. The inferior vena cava is normal in size with greater than 50%  respiratory variability, suggesting right atrial pressure of 3 mmHg.   Assessment and Plan:  1.  Severe aortic stenosis status post 26 mm Edwards SAPIEN TAVR in November 2021.  He reports NYHA class II dyspnea with typical activities.  Follow-up echocardiogram will be obtained in December of this year.  Last examination showed mean gradient of 10 mmHg with mild paravalvular regurgitation.  2.  CAD status post BMS to nondominant RCA in 2006 as well as DES x 2 to the midline ED in November 2021.  No active angina at this time.  Continue aspirin, Lipitor, Imdur, and as needed nitroglycerin.  3.  Orthostatic lightheadedness, no syncope.  We will reduce Norvasc to 5 mg daily.  Continue to track blood pressure.  4.  Mixed hyperlipidemia on Lipitor.  Recent LDL 48.  Medication Adjustments/Labs and Tests Ordered: Current  medicines are reviewed at length with the patient today.  Concerns regarding medicines are outlined above.   Tests Ordered: Orders Placed This Encounter  Procedures   EKG 12-Lead   ECHOCARDIOGRAM COMPLETE    Medication Changes: Meds ordered this encounter  Medications   amLODipine (NORVASC) 5 MG tablet    Sig: Take 1 tablet (5 mg total) by mouth daily.    Dispense:  90 tablet    Refill:  1    08/19/21 Dose Decrease    Disposition:  Follow up  6 months.  Signed, SaSatira SarkMD, FAMem[facility]/09/2021 9:09 AM    [place] [address]hone: [phone]: [phone]

## 2021-08-19 ENCOUNTER — Encounter: Payer: Self-pay | Admitting: Cardiology

## 2021-08-19 ENCOUNTER — Ambulatory Visit (INDEPENDENT_AMBULATORY_CARE_PROVIDER_SITE_OTHER): Payer: Medicare Other | Admitting: Cardiology

## 2021-08-19 VITALS — BP 132/60 | HR 74 | Ht 66.0 in | Wt 229.6 lb

## 2021-08-19 DIAGNOSIS — Z952 Presence of prosthetic heart valve: Secondary | ICD-10-CM | POA: Diagnosis not present

## 2021-08-19 DIAGNOSIS — R42 Dizziness and giddiness: Secondary | ICD-10-CM

## 2021-08-19 DIAGNOSIS — E782 Mixed hyperlipidemia: Secondary | ICD-10-CM

## 2021-08-19 DIAGNOSIS — I25119 Atherosclerotic heart disease of native coronary artery with unspecified angina pectoris: Secondary | ICD-10-CM | POA: Diagnosis not present

## 2021-08-19 MED ORDER — AMLODIPINE BESYLATE 5 MG PO TABS: 5.0000 mg | ORAL_TABLET | Freq: Every day | ORAL | 1 refills | Status: DC

## 2021-08-19 NOTE — Patient Instructions (Signed)
Medication Instructions:  Your physician has recommended you make the following change in your medication:  Decrease amlodipine to 5 mg once a day Continue all other medications as directed  Labwork: none  Testing/Procedures: Your physician has requested that you have an echocardiogram. Echocardiography is a painless test that uses sound waves to create images of your heart. It provides your doctor with information about the size and shape of your heart and how well your heart's chambers and valves are working. This procedure takes approximately one hour. There are no restrictions for this procedure.   Follow-Up:  Your physician recommends that you schedule a follow-up appointment in: December   Any Other Special Instructions Will Be Listed Below (If Applicable).  If you need a refill on your cardiac medications before your next appointment, please call your pharmacy.

## 2021-08-19 NOTE — Progress Notes (Unsigned)
PATIENT: [name] DOB: 10-27-1940  REASON FOR VISIT: follow up HISTORY FROM: patient Primary neurologist: Dr. Frances Furbish  Chief Complaint  Patient presents with   Follow-up    [name] states no questions or concerns for this visit      HISTORY OF PRESENT ILLNESS: Today 08/20/21:  [name] is an 81 year old male with a history of obstructive sleep apnea on CPAP.  He returns today for follow-up.  Download is below. Not sure she has noticed the benefit.  Would like to try different mask.    08/14/20: [name] is an 81 year old male with a history of obstructive sleep apnea on CPAP.  He returns today for follow-up.  He reports that the CPAP is working well.  He denies any new issues.  Returns today for an evaluation.    08/15/19: [name] is a 81 year old male with a history of obstructive sleep apnea on CPAP.  His download indicates that he uses machine nightly for compliance of 100%.  He uses machine greater than 4 hours each night.  On average he uses his machine 8 hours and 38 minutes.  His residual AHI is 3.4 on 9 cm of water with EPR 3.  Reports that the CPAP is working well for him.  He returns today for an evaluation.  HISTORY 08/15/18:   [name] is a 81 year old male with a history of obstructive sleep apnea on CPAP.  His CPAP download indicates that he uses machine nightly for compliance of 100%.  He uses machine greater than 4 hours 29 out of 30 days for compliance of 97%.  On average he uses his machine 7 hours and 23 minutes.  His residual AHI is 2 on 8 cm of water with EPR of 3.  His leak in the 95th percentile is 10.9.  He reports that some nights he does not feel that he is getting enough air.  He is wondering if we can increase the pressure.  He returns today for follow-up.  REVIEW OF SYSTEMS: Out of a complete 14 system review of symptoms, the patient complains only of the following symptoms, and all other reviewed systems are negative.   ESS  14  ALLERGIES: Allergies  Allergen Reactions   Ace Inhibitors Hives, Swelling and Rash    Rash,hives,tongue swelling   Angiotensin Receptor Blockers     Unknown reaction    Oxycodone-Acetaminophen Other (See Comments)    Unknown reaction   Robaxin [Methocarbamol] Other (See Comments)    Confusion    Toradol [Ketorolac Tromethamine] Other (See Comments)    confusion   Valium Other (See Comments)    Hallucinations; confusion   Doxycycline Hives and Rash    HOME MEDICATIONS: Outpatient Medications Prior to Visit  Medication Sig Dispense Refill   acetaminophen (TYLENOL) 650 MG CR tablet Take 1,300 mg by mouth 2 (two) times daily.     amLODipine (NORVASC) 5 MG tablet Take 1 tablet (5 mg total) by mouth daily. 90 tablet 1   aspirin EC 81 MG tablet Take 81 mg by mouth at bedtime.      atorvastatin (LIPITOR) 40 MG tablet TAKE 1 TABLET BY MOUTH IN  THE EVENING 90 tablet 3   butalbital-acetaminophen-caffeine (FIORICET) 50-325-40 MG tablet Take 1 tablet by mouth 2 (two) times daily as needed for headache.     cetirizine (ZYRTEC) 10 MG tablet Take 10 mg by mouth daily with supper.      chlorthalidone (HYGROTON) 25 MG tablet  TAKE 1 TABLET BY MOUTH  DAILY 90 tablet 3   Cholecalciferol (VITAMIN D3) 50 MCG (2000 UT) capsule Take 4,000 Units by mouth daily.      Continuous Blood Gluc Receiver (DEXCOM G6 RECEIVER) DEVI USE TO CHECK BLOOD SUGAR UP TO 4 TIMES DAILY AS DIRECTED. 1 DEVICE PER YEAR. DX: E11.9 1 each 1   Continuous Blood Gluc Sensor (DEXCOM G6 SENSOR) MISC USE TO CHECK BLOOD SUGAR UP TO 4 TIMES DAILY AS DIRECTED. CHANGE SENSOR EVERY 30 DAYS. DX: E11.9 3 each 4   Continuous Blood Gluc Transmit (DEXCOM G6 TRANSMITTER) MISC USE TO CHECK BLOOD SUGAR UP TO 4 TIMES DAILY AS DIRECTED. CHANGE TRANSMITTER EVERY 3 MONTHS. DX: E11.9 1 each 12   docusate sodium (COLACE) 100 MG capsule Take 100 mg by mouth daily as needed for mild constipation or moderate constipation.      donepezil (ARICEPT) 5 MG  tablet Take 1 tablet (5 mg total) by mouth at bedtime. 90 tablet 3   famotidine (PEPCID) 20 MG tablet Take 1 tablet (20 mg total) by mouth 2 (two) times daily with a meal. 180 tablet 3   furosemide (LASIX) 20 MG tablet TAKE 1 TABLET BY MOUTH  DAILY AS NEEDED 90 tablet 3   glucose blood (ONETOUCH ULTRA) test strip Check BS TID Dx E11.59 400 each 3   Insulin Glargine (BASAGLAR KWIKPEN) 100 UNIT/ML Inject 56 Units into the skin at bedtime. 15 mL 3   insulin lispro (HUMALOG KWIKPEN) 100 UNIT/ML KwikPen Inject 20 Units into the skin 3 (three) times daily with meals. 45 mL 11   isosorbide mononitrate (IMDUR) 30 MG 24 hr tablet TAKE 1 TABLET BY MOUTH IN  THE EVENING 90 tablet 3   metFORMIN (GLUCOPHAGE-XR) 500 MG 24 hr tablet Take 2 tablets (1,000 mg total) by mouth 2 (two) times daily. 360 tablet 3   nitroGLYCERIN (NITROSTAT) 0.4 MG SL tablet DISSOLVE ONE TABLET UNDER THE TONGUE EVERY 5 MINUTES AS NEEDED FOR CHEST PAIN.  DO NOT EXCEED A TOTAL OF 3 DOSES IN 15 MINUTES 25 tablet 3   Omega-3 Fatty Acids (FISH OIL) 1200 MG CAPS Take 1,200-2,400 mg by mouth See admin instructions. Tale 1200 mg in the morning and 2400 mg in the evening     PARoxetine (PAXIL) 20 MG tablet Take 1 tablet (20 mg total) by mouth daily. 90 tablet 3   PFIZER-BIONT COVID-19 VAC-TRIS SUSP injection      Semaglutide, 1 MG/DOSE, 4 MG/3ML SOPN Inject 1 mg as directed once a week. 9 mL 3   tamsulosin (FLOMAX) 0.4 MG CAPS capsule Take 1 capsule (0.4 mg total) by mouth daily. 90 capsule 3   Turmeric Curcumin 500 MG CAPS Take 500 mg by mouth daily.     vitamin B-12 (CYANOCOBALAMIN) 1000 MCG tablet Take 2,000 mcg by mouth daily.     No facility-administered medications prior to visit.    PAST MEDICAL HISTORY: Past Medical History:  Diagnosis Date   Anxiety    Aortic atherosclerosis (HCC) 12/19/2017   Aortic stenosis    26 mm Edwards S3U THV November 2021   Arthritis    Bursitis of hip    Cervical spondylosis    Chronic headaches     Coronary atherosclerosis of native coronary artery    BMS nondominant RCA 12/2004, DES x2 to mid LAD 11/2019   Essential hypertension    GERD (gastroesophageal reflux disease)    History of adenomatous polyp of colon    History of kidney stones  History of MI (myocardial infarction) 12/2004   History of viral meningitis 02/28/2018   December 2019   Hyperlipidemia    Internal hemorrhoids    Low back pain    Nocturia more than twice per night    OSA (obstructive sleep apnea)    Spinal stenosis    Type 2 diabetes mellitus treated with insulin (HCC)    UTI (urinary tract infection)    Wears dentures    Upper    PAST SURGICAL HISTORY: Past Surgical History:  Procedure Laterality Date   CARDIAC CATHETERIZATION  06-14-2004  dr Riley Kill   total occlusion mD1, RI 30-40%, mRCA nondominant hazy 80% and scattered 30-40%,  ef 71% (medical mangement)   CARDIOVASCULAR STRESS TEST  09-17-2015   dr Diona Browner   Low risk nuclear study w/ reversible mild small anteroapical defect,  normal LV function and wall motion, nuclear stress ef 61%   CATARACT EXTRACTION W/ INTRAOCULAR LENS IMPLANT Right 07/2014   COLONOSCOPY  09/10/2008   normal   CORONARY ANGIOPLASTY WITH STENT PLACEMENT  12-17-2004   dr benismhon/ dr Juanda Chance   nonobstructive cad LAD and LCFx,  BMS x1 to total occlusion RCA    CORONARY ATHERECTOMY  11/22/2019   CORONARY ATHERECTOMY N/A 11/22/2019   Procedure: CORONARY ATHERECTOMY;  Surgeon: Kathleene Hazel, MD;  Location: [facility] INVASIVE CV LAB;  Service: Cardiovascular;  Laterality: N/A;   CYSTOSCOPY W/ URETEROSCOPY W/ LITHOTRIPSY  08/2010   ERCP  03/14/2008   EYE SURGERY Bilateral    cataracts   LAPAROSCOPIC CHOLECYSTECTOMY  05/2012   LEFT HEART CATHETERIZATION WITH CORONARY ANGIOGRAM N/A 05/14/2011   Procedure: LEFT HEART CATHETERIZATION WITH CORONARY ANGIOGRAM;  Surgeon: Tonny Bollman, MD;  Location: [facility] CATH LAB;  Service: Cardiovascular;  Laterality: N/A;    non-obstructive LM,  LAD, LCFx, patent RCA stent   LUMBAR LAMINECTOMY/DECOMPRESSION MICRODISCECTOMY N/A 02/24/2017   Procedure: Microlumbar decompression L4-5, L5-S1;  Surgeon: Jene Every, MD;  Location: [facility] ORS;  Service: Orthopedics;  Laterality: N/A;  120 mins   RIGHT/LEFT HEART CATH AND CORONARY ANGIOGRAPHY N/A 11/14/2019   Procedure: RIGHT/LEFT HEART CATH AND CORONARY ANGIOGRAPHY;  Surgeon: Kathleene Hazel, MD;  Location: [facility] INVAS[facility] CV LAB;  Service: Cardiovascular;  Laterality: N/A;   ROTATOR CUFF REPAIR Right 03/2002   ROTATOR CUFF REPAIR Left 12/11/2015   TEE WITHOUT CARDIOVERSION N/A 12/04/2019   Procedure: TRANSESOPHAGEAL ECHOCARDIOGRAM (TEE);  Surgeon: Kathleene Hazel, MD;  Location[facility] INVASIVE CV LAB;  Service: Open Heart Surgery;  Laterality: N/A;   TOTAL HIP ARTHROPLASTY Right 12/20/2008   TOTAL HIP ARTHROPLASTY Left 07/05/2018   Procedure: TOTAL HIP ARTHROPLASTY ANTERIOR APPROACH;  Surgeon: Ollen Gross, MD;  Location: [facility] ORS;  Service: Orthopedics;  Laterality: Left;    TOTAL KNEE ARTHROPLASTY Bilateral left 12-11-2003/  right 07-31-2004   dr Lequita [facility]   TRANSCATHETER AORTIC VALVE REPLACEMENT, TRANSFEMORAL N/A 12/04/2019   Procedure: TRANSCATHETER AORTIC VALVE REPLACEMENT, TRANSFEMORAL;  Surgeon: Kathleene Hazel, MD;  Location: [facility] INVASIVE CV LAB;  Service: Open Heart Surgery;  Laterality: N/A;   UPPER GASTROINTESTINAL ENDOSCOPY  01/08/2008   bx, inlet patch, duodenitis   YAG LASER APPLICATION Right 07/29/2014   Procedure: YAG LASER APPLICATION;  Surgeon: Susa Simmonds, MD;  Location: [facility] ORS;  Service: Ophthalmology;  Laterality: Right;    FAMILY HISTORY: Family History  Problem Relation Age of Onset   Colon cancer Mother        Diagnosed age 7   Cancer Mother 75   Heart disease Father  Heart attack Father    Breast cancer Sister    Brain cancer Sister    Heart disease Brother    Heart disease Brother    Bladder Cancer Brother    Cancer - Other  Brother    Sleep apnea Neg Hx     SOCIAL HISTORY: Social History   Socioeconomic History   Marital status: Married    Spouse name: Dione    Number of children: 1   Years of education: HS   Highest education level: High school graduate  Occupational History   Occupation: RETIRED    Employer: [employer]    Comment: Power plant  Tobacco Use   Smoking status: Former    Packs/day: 1.00    Years: 20.00    Total pack years: 20.00    Types: Cigarettes    Start date: 01/18/1963    Quit date: 08/11/1988    Years since quitting: 33.0   Smokeless tobacco: Former    Types: Chew    Quit date: 01/11/1989   Tobacco comments:    chewed 1 pack tobacco/day for 15 years  Vaping Use   Vaping Use: Never used  Substance and Sexual Activity   Alcohol use: No    Alcohol/week: 0.0 standard drinks of alcohol   Drug use: No   Sexual activity: Yes  Other Topics Concern   Not on file  Social History Narrative   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 between [phone]   Social Determinants of Health   Financial Resource Strain: Low Risk  (11/28/2020)   Overall Financial Resource Strain (CARDIA)    Difficulty of Paying Living Expenses: Not very hard  Food Insecurity: No Food Insecurity (11/28/2020)   Hunger Vital Sign    Worried About Running Out of Food in the Last Year: Never true    Ran Out of Food in the Last Year: Never true  Transportation Needs: No Transportation Needs (11/28/2020)   PRAPARE - Administrator, Civil Service (Medical): No    Lack of Transportation (Non-Medical): No  Physical Activity: Sufficiently Active (11/28/2020)   Exercise Vital Sign    Days of Exercise per Week: 3 days    Minutes of Exercise per Session: 60 min  Stress: No Stress Concern Present (11/28/2020)   [employer]  of Occupational Health - Occupational Stress Questionnaire    Feeling of Stress : Only a little  Social Connections: Moderately Isolated (11/28/2020)   Social Connection and Isolation Panel [NHANES]    Frequency of Communication with Friends and Family: More than three times a week    Frequency of Social Gatherings with Friends and Family: Once a week    Attends Religious Services: Never    Database administrator or Organizations: No    Attends Banker Meetings: Never    Marital Status: Married  Catering manager Violence: Not At Risk (11/28/2020)   Humiliation, Afraid, Rape, and Kick questionnaire    Fear of Current or Ex-Partner: No    Emotionally Abused: No    Physically Abused: No    Sexually Abused: No      PHYSICAL EXAM  Vitals:   08/20/21 0921  BP: 136/65  Pulse: 76  Weight: 230 lb (104.3 kg)  Height: 5\' 6"  (1.676 m)    Body mass index is 37.12 kg/m.  Generalized: Well developed, in no acute distress  Chest: Lungs clear to auscultation bilaterally  Neurological examination  Mentation: Alert oriented to time, place, history taking. Follows all commands speech and language fluent Cranial nerve II-XII: Extraocular movements were full, visual field were full on confrontational test Head turning and shoulder shrug  were normal and symmetric.  Mallampati Gait and station: Gait is normal.    DIAGNOSTIC DATA (LABS, IMAGING, TESTING) - I reviewed patient records, labs, notes, testing and imaging myself where available.  Lab Results  Component Value Date   WBC 7.6 04/29/2021   HGB 13.1 04/29/2021   HCT 39.0 04/29/2021   MCV 85 04/29/2021   PLT 265 04/29/2021      Component Value Date/Time   NA 137 05/19/2021 0951   K 4.0 05/19/2021 0951   CL 96 05/19/2021 0951   CO2 23 05/19/2021 0951   GLUCOSE 185 (H) 05/19/2021 0951   GLUCOSE 192 (H) 07/07/2020 0645   BUN 15 05/19/2021 0951   CREATININE 1.18 05/19/2021 0951   CREATININE 0.93 06/02/2012 1007    CALCIUM 9.7 05/19/2021 0951   PROT 6.8 04/29/2021 0827   ALBUMIN 4.1 04/29/2021 0827   AST 19 04/29/2021 0827   ALT 21 04/29/2021 0827   ALKPHOS 71 04/29/2021 0827   BILITOT 0.5 04/29/2021 0827   GFRNONAA >60 07/07/2020 0645   GFRNONAA 82 06/02/2012 1007   GFRAA 85 12/28/2019 0951   GFRAA >89 06/02/2012 1007   Lab Results  Component Value Date   CHOL 114 04/29/2021   HDL 34 (L) 04/29/2021   LDLCALC 48 04/29/2021   TRIG 199 (H) 04/29/2021   CHOLHDL 3.4 04/29/2021   Lab Results  Component Value Date   HGBA1C 7.7 (H) 07/29/2021   Lab Results  Component Value Date   VITAMINB12 1,002 02/28/2018   Lab Results  Component Value Date   TSH 0.930 12/15/2017      ASSESSMENT AND PLAN 81 y.o. year old male  has a past medical history of Anxiety, Aortic atherosclerosis (HCC) (12/19/2017), Aortic stenosis, Arthritis, Bursitis of hip, Cervical spondylosis, Chronic headaches, Coronary atherosclerosis of native coronary artery, Essential hypertension, GERD (gastroesophageal reflux disease), History of adenomatous polyp of colon, History of kidney stones, History of MI (myocardial infarction) (12/2004), History of viral meningitis (02/28/2018), Hyperlipidemia, Internal hemorrhoids, Low back pain, Nocturia more than twice per night, OSA (obstructive sleep apnea), Spinal stenosis, Type 2 diabetes mellitus treated with insulin (HCC), UTI (urinary tract infection), and Wears dentures. here with:  OSA on CPAP  - CPAP compliance excellent - Good treatment of AHI  - Encourage patient to use CPAP nightly and > 4 hours each night - F/U in 1 year or sooner if needed    Butch Penny, MSN, NP-C 08/20/2021, 9:23 AM [facility] [address] [place], [place] [address] [phone]

## 2021-08-20 ENCOUNTER — Ambulatory Visit: Payer: Medicare Other | Admitting: Adult Health

## 2021-08-20 ENCOUNTER — Encounter: Payer: Self-pay | Admitting: Adult Health

## 2021-08-20 VITALS — BP 136/65 | HR 76 | Ht 66.0 in | Wt 230.0 lb

## 2021-08-20 DIAGNOSIS — G4733 Obstructive sleep apnea (adult) (pediatric): Secondary | ICD-10-CM | POA: Diagnosis not present

## 2021-08-20 DIAGNOSIS — Z9989 Dependence on other enabling machines and devices: Secondary | ICD-10-CM | POA: Diagnosis not present

## 2021-08-20 NOTE — Patient Instructions (Signed)
Continue using CPAP nightly and greater than 4 hours each night Mask refitting ordered If your symptoms worsen or you develop new symptoms please let us know.   

## 2021-08-24 ENCOUNTER — Telehealth: Payer: Self-pay | Admitting: Family Medicine

## 2021-08-24 NOTE — Telephone Encounter (Signed)
  Prescription Request  08/24/2021  Is this a "Controlled Substance" medicine? no  Have you seen your PCP in the last 2 weeks? no  If YES, route message to pool  -  If NO, patient needs to be scheduled for appointment.  What is the name of the medication or equipment? Dexcon sensors and transmitter  Have you contacted your pharmacy to request a refill? No    Which pharmacy would you like this sent to? [facility]   Patient notified that their request is being sent to the clinical staff for review and that they should receive a response within 2 business days.

## 2021-08-24 NOTE — Telephone Encounter (Signed)
TC back to [facility], pt has been using [employer] for a long period of time. They will cancel order.

## 2021-08-31 NOTE — Progress Notes (Signed)
Fax confirmation received for pts mask refitting. [phone].).  Hepler, Melanie Ricki Rodriguez, RN got it      Previous Messages    ----- Message -----  From: Brandon Melnick, RN  Sent: 08/24/2021  10:06 AM EDT  To: Cammie Mcgee; Melanie Renee Hepler   Good Morning,    New order in Epic for pt:   [name]"  Male, 81 y.o., Dec 18, 1940  Pronouns:  he/him/his  MRN:  [id number]  Thank you,   Lovey Newcomer RN

## 2021-09-02 ENCOUNTER — Ambulatory Visit (INDEPENDENT_AMBULATORY_CARE_PROVIDER_SITE_OTHER): Payer: Medicare Other | Admitting: Pharmacist

## 2021-09-02 DIAGNOSIS — E1169 Type 2 diabetes mellitus with other specified complication: Secondary | ICD-10-CM

## 2021-09-02 DIAGNOSIS — E1159 Type 2 diabetes mellitus with other circulatory complications: Secondary | ICD-10-CM

## 2021-09-02 MED ORDER — ONETOUCH ULTRA VI STRP: ORAL_STRIP | 3 refills | Status: DC

## 2021-09-02 MED ORDER — DEXCOM G7 RECEIVER DEVI: 0 refills | Status: DC

## 2021-09-02 NOTE — Progress Notes (Signed)
Chronic Care Management Pharmacy Note  09/02/2021 Name:  [name] MRN:  [id number] DOB:  Oct 01, 1940  Summary:   Diabetes: Uncontrolled (a1c 7.3-->7.7%); current treatment: BASAGLAR 50-56 UNITS, HUMALOG KWIKPEN 10-20 UNITS TID MEALS, METFORMIN 2G PER DAY; Ozempic (increase to 2m weekly) GFR 65-->62 CONTINUE TO WORK ON DIET INCREASE Ozempic to 261mweekly 4 MONTH SUPPLY ARRIVED TODAY Continue Basaglar to 50-56 units Continue Humalog meal time to 10-20 units Current glucose readings: fasting glucose: <200, post prandial glucose: 180-200s Patient wears dexcom CGM-->ATTEMPTING TO TRANSITION TO [place] sent to [facility] via [facility] portal Denies hypoglycemic/hyperglycemic symptoms Discussed meal planning options and Plate method for healthy eating Avoid sugary drinks and desserts Incorporate balanced protein, non starchy veggies, 1 serving of carbohydrate with each meal Increase water intake Increase physical activity as able Current exercise: N/A Educated on DEXCOM, REFILLS CALLED IN discussed dexcom g7--should be available in the next month Assessed patient finances. Re-enrollment approved [facility] BASAGLAR & HUMALOG--FOR 2023; approved for novo nordisk PAP for ozempic2023- Patient Goals/Self-Care Activities Over the next 90 days, patient will:  - take medications as prescribed check glucose CONTINUOUSLY USING CGM, document, and provide at future appointments collaborate with provider on medication access solutions  Follow Up Plan: Telephone follow up appointment with care management team member scheduled for: 2 months   Subjective: [name] KIRKSEYs an [age].o. year old male who is a primary patient of Dettinger, JoFransisca KaufmannMD.  The CCM team was consulted for assistance with disease management and care coordination needs.    Engaged with patient face to face for follow up visit in response to provider referral  for pharmacy case management and/or care coordination services.   Consent to Services:  The patient was given information about Chronic Care Management services, agreed to services, and gave verbal consent prior to initiation of services.  Please see initial visit note for detailed documentation.   Patient Care Team: Dettinger, JoFransisca KaufmannMD as PCP - General (Family Medicine) McSatira SarkMD as PCP - Cardiology (Cardiology) McBurnell BlanksMD as PCP - Structural Heart (Cardiology) McSatira SarkMD as Consulting Physician (Cardiology) CoSydnee CabalMD as Consulting Physician (Orthopedic Surgery) AlGaynelle ArabianMD as Consulting Physician (Orthopedic Surgery) RaSuella BroadMD as Consulting Physician (Physical Medicine and Rehabilitation) PrLavera GuiseRPAnnapolis Ent Surgical Center LLCPharmacist) TaLavonna MonarchMD as Consulting Physician (Dermatology)   Objective:  Lab Results  Component Value Date   CREATININE 1.18 05/19/2021   CREATININE 1.39 (H) 04/29/2021   CREATININE 1.01 08/13/2020    Lab Results  Component Value Date   HGBA1C 7.7 (H) 07/29/2021   Last diabetic Eye exam:  Lab Results  Component Value Date/Time   HMDIABEYEEXA No Retinopathy 03/31/2021 12:00 AM    Last diabetic Foot exam: No results found for: "HMDIABFOOTEX"      Component Value Date/Time   CHOL 114 04/29/2021 0827   CHOL 139 06/02/2012 1007   TRIG 199 (H) 04/29/2021 0827   TRIG 185 (H) 04/10/2013 0937   TRIG 107 06/02/2012 1007   HDL 34 (L) 04/29/2021 0827   HDL 34 (L) 04/10/2013 0937   HDL 40 06/02/2012 1007   CHOLHDL 3.4 04/29/2021 0827   CHOLHDL 3.8 11/16/2019 0206   VLDL 25 11/16/2019 0206   LDLCALC 48 04/29/2021 0827   LDLCALC 62 04/10/2013 0937   LDL[place]8 06/02/2012 1007       Latest Ref Rng & Units 04/29/2021  8:27 AM 07/18/2020    9:59 AM 07/02/2020    1:38 PM  Hepatic Function  Total Protein 6.0 - 8.5 g/dL 6.8  6.8  7.0   Albumin 3.6 - 4.6 g/dL 4.1  3.9  3.4   AST 0 - 40  IU/L _0 ALT 0 - 44 IU/L _1 Alk Phosphatase 44 - 121 IU/L 71  77  74   Total Bilirubin 0.0 - 1.2 mg/dL 0.5  0.5  0.7     Lab Results  Component Value Date/Time   TSH 0.930 12/15/2017 05:52 AM   TSH 1.530 04/28/2017 09:01 AM   TSH 1.490 04/10/2013 09:37 AM       Latest Ref Rng & Units 04/29/2021    8:27 AM 10/20/2020    8:54 AM 07/18/2020    9:59 AM  CBC  WBC 3.4 - 10.8 x10E3/uL 7.6  7.3  6.7   Hemoglobin 13.0 - 17.7 g/dL 13.1  13.1  12.8   Hematocrit 37.5 - 51.0 % 39.0  38.6  39.7   Platelets 150 - 450 x10E3/uL 265  290  299     Lab Results  Component Value Date/Time   VD25OH 44.6 10/26/2016 09:01 AM    Clinical ASCVD: No  The ASCVD Risk score (Arnett DK, et al., 2019) failed to calculate for the following reasons:   The 2019 ASCVD risk score is only valid for ages 38 to 27    Other: (CHADS2VASc if Afib, PHQ9 if depression, MMRC or CAT for COPD, ACT, DEXA)  Social History   Tobacco Use  Smoking Status Former   Packs/day: 1.00   Years: 20.00   Total pack years: 20.00   Types: Cigarettes   Start date: 01/18/1963   Quit date: 08/11/1988   Years since quitting: 33.1  Smokeless Tobacco Former   Types: Chew   Quit date: 01/11/1989  Tobacco Comments   chewed 1 pack tobacco/day for 15 years   BP Readings from Last 3 Encounters:  08/20/21 136/65  08/19/21 132/60  07/29/21 125/68   Pulse Readings from Last 3 Encounters:  08/20/21 76  08/19/21 74  07/29/21 77   Wt Readings from Last 3 Encounters:  08/20/21 230 lb (104.3 kg)  08/19/21 229 lb 9.6 oz (104.1 kg)  07/29/21 228 lb (103.4 kg)    Assessment: Review of patient past medical history, allergies, medications, health status, including review of consultants reports, laboratory and other test data, was performed as part of comprehensive evaluation and provision of chronic care management services.   SDOH:  (Social Determinants of Health) assessments and interventions performed:    CCM Care  Plan  Allergies  Allergen Reactions   Ace Inhibitors Hives, Swelling and Rash    Rash,hives,tongue swelling   Angiotensin Receptor Blockers     Unknown reaction    Oxycodone-Acetaminophen Other (See Comments)    Unknown reaction   Robaxin [Methocarbamol] Other (See Comments)    Confusion    Toradol [Ketorolac Tromethamine] Other (See Comments)    confusion   Valium Other (See Comments)    Hallucinations; confusion   Doxycycline Hives and Rash    Medications Reviewed Today     Reviewed by Lavera Guise, [facility] (Pharmacist) on 09/02/21 at 1102  Med List Status: <None>   Medication Order Taking? Sig Documenting Provider Last Dose Status Informant  acetaminophen (TYLENOL) 650 MG CR tablet [id number]  Take 1,300 mg by mouth 2 (two) times daily.  [provider]  Active Spouse/Significant Other  amLODipine (NORVASC) 5 MG tablet [id number]  Take 1 tablet (5 mg total) by mouth daily. Satira Sark, MD  Active   aspirin EC 81 MG tablet [id number]  Take 81 mg by mouth at bedtime.  [provider]  Active Spouse/Significant Other  atorvastatin (LIPITOR) 40 MG tablet [id number]  TAKE 1 TABLET BY MOUTH IN  THE Ophelia Shoulder, MD  Active   butalbital-acetaminophen-caffeine (FIORICET) 50-325-40 MG tablet [id number]  Take 1 tablet by mouth 2 (two) times daily as needed for headache. [provider]  Active Spouse/Significant Other  cetirizine (ZYRTEC) 10 MG tablet [id number]  Take 10 mg by mouth daily with supper.  [provider]  Active Spouse/Significant Other  chlorthalidone (HYGROTON) 25 MG tablet [id number]  TAKE 1 TABLET BY MOUTH  DAILY Satira Sark, MD  Active   Cholecalciferol (VITAMIN D3) 50 MCG (2000 UT) capsule [id number]  Take 4,000 Units by mouth daily.  [provider]  Active Spouse/Significant Other  Continuous Blood Gluc Receiver ([place]) [place] [id number] Yes Use to test blood sugar continuously.  DX: E11.65 patient  to use good rx card; receiver only Dettinger, Fransisca Kaufmann, MD  Active     Discontinued 09/02/21 1102 (Change in therapy)     Discontinued 09/02/21 1102 (Change in therapy)   docusate sodium (COLACE) 100 MG capsule [id number]  Take 100 mg by mouth daily as needed for mild constipation or moderate constipation.  [provider]  Active Spouse/Significant Other  donepezil (ARICEPT) 5 MG tablet [id number]  Take 1 tablet (5 mg total) by mouth at bedtime. Dettinger, Fransisca Kaufmann, MD  Active   famotidine (PEPCID) 20 MG tablet [id number]  Take 1 tablet (20 mg total) by mouth 2 (two) times daily with a meal. Dettinger, Fransisca Kaufmann, MD  Active   furosemide (LASIX) 20 MG tablet [id number]  TAKE 1 TABLET BY MOUTH  DAILY AS NEEDED Burnell Blanks, MD  Active Spouse/Significant Other  glucose blood (ONETOUCH ULTRA) test strip [id number]  Check BS TID Dx E11.59 Dettinger, Fransisca Kaufmann, MD  Active Spouse/Significant Other  Insulin Glargine (BASAGLAR KWIKPEN) 100 UNIT/ML [id number]  Inject 56 Units into the skin at bedtime. Dettinger, Fransisca Kaufmann, MD  Active   insulin lispro (HUMALOG KWIKPEN) 100 UNIT/ML KwikPen [id number]  Inject 20 Units into the skin 3 (three) times daily with meals. Dettinger, Fransisca Kaufmann, MD  Active   isosorbide mononitrate (IMDUR) 30 MG 24 hr tablet [id number]  TAKE 1 TABLET BY MOUTH IN  THE Ophelia Shoulder, MD  Active   metFORMIN (GLUCOPHAGE-XR) 500 MG 24 hr tablet [id number]  Take 2 tablets (1,000 mg total) by mouth 2 (two) times daily. Dettinger, Fransisca Kaufmann, MD  Active   nitroGLYCERIN (NITROSTAT) 0.4 MG SL tablet [id number]  DISSOLVE ONE TABLET UNDER THE TONGUE EVERY 5 MINUTES AS NEEDED FOR CHEST PAIN.  DO NOT EXCEED A TOTAL OF 3 DOSES IN 15 MINUTES Satira Sark, MD  Active Spouse/Significant Other           Med Note Lia Hopping, Toniann Fail   Thu Dec 13, 2019  3:21 PM)    Omega-3 Fatty Acids (FISH OIL) 1200 MG CAPS [id number]  Take 1,200-2,400 mg by mouth See admin instructions. Tale 1200 mg in the  morning and 2400 mg in the evening [provider]  Active Spouse/Significant Other  PARoxetine (PAXIL) 20 MG tablet [id number]  Take 1 tablet (20 mg total) by mouth daily.  Dettinger, Fransisca Kaufmann, MD  Active   PFIZER-BIONT COVID-19 VAC-TRIS SUSP injection [id number]   [provider]  Active   Semaglutide, 1 MG/DOSE, 4 MG/3ML SOPN [id number]  Inject 1 mg as directed once a week. Dettinger, Fransisca Kaufmann, MD  Active            Med Note Blanca Friend, Damoney Julia D   Tue Aug 04, 2021 11:23 AM) INCREASE TO OZEMPIC 2MG WEEKLY WHEN PAP SUPPLY COMES IN  tamsulosin (FLOMAX) 0.4 MG CAPS capsule [id number]  Take 1 capsule (0.4 mg total) by mouth daily. Dettinger, Fransisca Kaufmann, MD  Active   Turmeric Curcumin 500 MG CAPS [id number]  Take 500 mg by mouth daily. [provider]  Active Spouse/Significant Other  vitamin B-12 (CYANOCOBALAMIN) 1000 MCG tablet [id number]  Take 2,000 mcg by mouth daily. [provider]  Active Spouse/Significant Other            Patient Active Problem List   Diagnosis Date Noted   Mixed conductive and sensorineural hearing loss of left ear with restricted hearing of right ear 11/04/2020   Asbestosis ([place]) 07/15/2020   Thoracic ascending aortic aneurysm ([place]) 07/02/2020   Obstructive sleep apnea 07/02/2020   Dementia ([place]) 07/02/2020   Severe aortic stenosis 12/04/2019   Unstable angina ([facility])    Degeneration of lumbar intervertebral disc 05/02/2019   Lumbar post-laminectomy syndrome 05/02/2019   OAB (overactive bladder) 11/29/2018   History of viral meningitis 02/28/2018   Aortic atherosclerosis ([place]) 12/19/2017   Word finding difficulty 12/14/2017   History of total hip arthroplasty 10/12/2017   BPH (benign prostatic hyperplasia) 07/29/2017   Cervical spondylosis 07/21/2017   Spinal stenosis at L4-L5 level 02/24/2017   Microalbuminuria due to type 2 diabetes mellitus ([place]) 07/23/2016   Common migraine 12/23/2014   Hx of adenomatous colonic polyps 05/09/2014    Obesity (BMI 30-39.9) 07/30/2013   Type 2 diabetes mellitus with circulatory disorder ([place]) 05/27/2011   Hyperlipidemia associated with type 2 diabetes mellitus ([place]) 10/19/2008   Hypertension associated with diabetes ([place]) 10/19/2008   CORONARY ATHEROSCLEROSIS NATIVE CORONARY ARTERY 10/17/2008   GERD (gastroesophageal reflux disease) 12/27/2007    Immunization History  Administered Date(s) Administered   Fluad Quad(high Dose 65+) 10/20/2020   Influenza, High Dose Seasonal PF 10/29/2015, 10/31/2017   Influenza, Quadrivalent, Recombinant, Inj, Pf 10/19/2018   Influenza,inj,Quad PF,6+ Mos 11/08/2013, 10/26/2016   Influenza,inj,quad, With Preservative 10/26/2016   Influenza-Unspecified 11/18/2014, 10/25/2019, 10/25/2019   PFIZER(Purple Top)SARS-COV-2 Vaccination 04/13/2019, 05/07/2019, 11/08/2019, 09/10/2020   Pneumococcal Conjugate-13 01/01/2013, 10/26/2016   Pneumococcal Polysaccharide-23 10/26/2016   Tdap 01/01/2013   Zoster Recombinat (Shingrix) 05/14/2016, 01/27/2017    Conditions to be addressed/monitored: DMII and CKD Stage 2  Care Plan : PHARMD MEDICATION MANAGEMENT  Updates made by Lavera Guise, [place] since 09/11/2021 12:00 AM     Problem: DISEASE PROGRESSION PREVENTION      Long-Range Goal: T2DM, CKD2   Recent Progress: Not on track  Priority: High  Note:   Current Barriers:  Unable to independently afford treatment regimen Unable to achieve control of T2DM   Pharmacist Clinical Goal(s):  Over the next 90 days, patient will verbalize ability to afford treatment regimen achieve control of T2DM as evidenced by IMPROVED GLYCEMIC CONTROL  through collaboration with PharmD and provider.    Interventions: 1:1 collaboration with Dettinger, Fransisca Kaufmann, MD regarding development and update of comprehensive plan of care as evidenced by provider attestation and co-signature Inter-disciplinary care team collaboration (see longitudinal plan of care) Comprehensive medication  review  performed; medication list updated in electronic medical record  Diabetes: Uncontrolled (a1c 7.3-->7.7%); current treatment: BASAGLAR 54 UNITS, HUMALOG KWIKPEN 20 UNITS TID MEALS, METFORMIN 2G PER DAY; Ozempic (increase to 64m weekly) GFR 65-->62 CONTINUE TO WORK ON DIET INCREASE Ozempic to 211mweekly 4 MONTH SUPPLY ARRIVED TODAY Continue Basaglar to 50-56 units Continue Humalog meal time to 10-20 units Current glucose readings: fasting glucose: <200, post prandial glucose: 180-200s Patient wears dexcom CGM-->ATTEMPTING TO TRANSITION TO [place] hypoglycemic/hyperglycemic symptoms Discussed meal planning options and Plate method for healthy eating Avoid sugary drinks and desserts Incorporate balanced protein, non starchy veggies, 1 serving of carbohydrate with each meal Increase water intake Increase physical activity as able Current exercise: N/A Educated on DEXCOM, REFILLS CALLED IN discussed dexcom g7--should be available in the next month Assessed patient finances. Re-enrollment approved [facility] BASAGLAR & HUMALOG--FOR 2023; approved for novo nordisk PAP for ozempic 2023- Patient Goals/Self-Care Activities Over the next 90 days, patient will:  - take medications as prescribed check glucose CONTINUOUSLY USING CGM, document, and provide at future appointments collaborate with provider on medication access solutions  Follow Up Plan: Telephone follow up appointment with care management team member scheduled for: 2 months     Plan: Telephone follow up appointment with care management team member scheduled for:  3 months   JuRegina EckPharmD, BCPS Clinical Pharmacist, [place] Phone [phone]

## 2021-09-03 ENCOUNTER — Telehealth: Payer: Self-pay | Admitting: Family Medicine

## 2021-09-08 NOTE — Telephone Encounter (Signed)
Wife states that she received an email from [place] stating that insurance will pay for the [place].  She states that they bought the receiver their self and julie gave them 2 months of sensors. Wife would like to know what she needs to do. Would like to speak to [place]. Aware you are not in office today.

## 2021-09-09 ENCOUNTER — Telehealth: Payer: Self-pay | Admitting: *Deleted

## 2021-09-09 NOTE — Telephone Encounter (Signed)
Please let patient know, I have been out of the office since last Friday.  They can schedule appt with me to set up or set up [place] system at home.  I'm not sure I have any quick appts.

## 2021-09-09 NOTE — Telephone Encounter (Signed)
#  4 pt assistance Ozempic pens here for pick up

## 2021-09-10 ENCOUNTER — Encounter: Payer: Self-pay | Admitting: *Deleted

## 2021-09-10 DIAGNOSIS — Z794 Long term (current) use of insulin: Secondary | ICD-10-CM

## 2021-09-10 DIAGNOSIS — I152 Hypertension secondary to endocrine disorders: Secondary | ICD-10-CM

## 2021-09-10 DIAGNOSIS — E785 Hyperlipidemia, unspecified: Secondary | ICD-10-CM

## 2021-09-10 DIAGNOSIS — E1159 Type 2 diabetes mellitus with other circulatory complications: Secondary | ICD-10-CM

## 2021-09-10 DIAGNOSIS — E1169 Type 2 diabetes mellitus with other specified complication: Secondary | ICD-10-CM

## 2021-09-11 MED ORDER — INSULIN LISPRO (1 UNIT DIAL) 100 UNIT/ML (KWIKPEN): 20.0000 [IU] | PEN_INJECTOR | Freq: Three times a day (TID) | SUBCUTANEOUS | 11 refills | Status: DC

## 2021-09-11 MED ORDER — BASAGLAR KWIKPEN 100 UNIT/ML ~~LOC~~ SOPN: 56.0000 [IU] | PEN_INJECTOR | Freq: Every day | SUBCUTANEOUS | 5 refills | Status: DC

## 2021-09-11 NOTE — Patient Instructions (Addendum)
Visit Information  Following are the goals we discussed today:  Current Barriers:  Unable to independently afford treatment regimen Unable to achieve control of T2DM   Pharmacist Clinical Goal(s):  Over the next 90 days, patient will verbalize ability to afford treatment regimen achieve control of T2DM as evidenced by IMPROVED GLYCEMIC CONTROL through collaboration with PharmD and provider.    Interventions: 1:1 collaboration with Dettinger, Fransisca Kaufmann, MD regarding development and update of comprehensive plan of care as evidenced by provider attestation and co-signature Inter-disciplinary care team collaboration (see longitudinal plan of care) Comprehensive medication review performed; medication list updated in electronic medical record  Diabetes: Uncontrolled (a1c 7.3-->7.7%); current treatment: BASAGLAR 54 UNITS, HUMALOG KWIKPEN 20 UNITS TID MEALS, METFORMIN 2G PER DAY; Ozempic (increase to '2mg'$  weekly) GFR 65-->62 CONTINUE TO WORK ON DIET INCREASE Ozempic to '2mg'$  weekly 4 MONTH SUPPLY ARRIVED TODAY Continue Basaglar to 50-56 units Continue Humalog meal time to 10-20 units Current glucose readings: fasting glucose: <200, post prandial glucose: 180-200s Patient wears dexcom CGM-->ATTEMPTING TO TRANSITION TO [place] Denies hypoglycemic/hyperglycemic symptoms Discussed meal planning options and Plate method for healthy eating Avoid sugary drinks and desserts Incorporate balanced protein, non starchy veggies, 1 serving of carbohydrate with each meal Increase water intake Increase physical activity as able Current exercise: N/A Educated on DEXCOM, REFILLS CALLED IN discussed dexcom g7--should be available in the next month Assessed patient finances. Re-enrollment approved [facility] BASAGLAR & HUMALOG--FOR 2023; approved for novo nordisk PAP for ozempic2023- Patient Goals/Self-Care Activities Over the next 90 days, patient will:  - take  medications as prescribed check glucose CONTINUOUSLY USING CGM, document, and provide at future appointments collaborate with provider on medication access solutions  Follow Up Plan: Telephone follow up appointment with care management team member scheduled for: 2 months   Plan: Telephone follow up appointment with care management team member scheduled for:  3 months  Signature Regina Eck, PharmD, BCPS Clinical Pharmacist, [place]  [facility] Phone [phone]   Please call the care guide team at [phone] if you need to cancel or reschedule your appointment.   The patient verbalized understanding of instructions, educational materials, and care plan provided today and DECLINED offer to receive copy of patient instructions, educational materials, and care plan.

## 2021-10-01 ENCOUNTER — Other Ambulatory Visit: Payer: Self-pay | Admitting: Cardiology

## 2021-10-11 ENCOUNTER — Other Ambulatory Visit: Payer: Self-pay | Admitting: Cardiology

## 2021-10-24 DIAGNOSIS — G4733 Obstructive sleep apnea (adult) (pediatric): Secondary | ICD-10-CM | POA: Diagnosis not present

## 2021-10-30 ENCOUNTER — Ambulatory Visit (INDEPENDENT_AMBULATORY_CARE_PROVIDER_SITE_OTHER): Payer: Medicare Other | Admitting: Family Medicine

## 2021-10-30 ENCOUNTER — Encounter: Payer: Self-pay | Admitting: Family Medicine

## 2021-10-30 VITALS — BP 128/71 | HR 79 | Temp 98.0°F | Ht 66.0 in | Wt 226.0 lb

## 2021-10-30 DIAGNOSIS — E1159 Type 2 diabetes mellitus with other circulatory complications: Secondary | ICD-10-CM | POA: Diagnosis not present

## 2021-10-30 DIAGNOSIS — Z23 Encounter for immunization: Secondary | ICD-10-CM

## 2021-10-30 DIAGNOSIS — E1169 Type 2 diabetes mellitus with other specified complication: Secondary | ICD-10-CM | POA: Diagnosis not present

## 2021-10-30 DIAGNOSIS — E785 Hyperlipidemia, unspecified: Secondary | ICD-10-CM

## 2021-10-30 DIAGNOSIS — Z794 Long term (current) use of insulin: Secondary | ICD-10-CM

## 2021-10-30 DIAGNOSIS — E1129 Type 2 diabetes mellitus with other diabetic kidney complication: Secondary | ICD-10-CM | POA: Diagnosis not present

## 2021-10-30 DIAGNOSIS — R809 Proteinuria, unspecified: Secondary | ICD-10-CM

## 2021-10-30 DIAGNOSIS — I152 Hypertension secondary to endocrine disorders: Secondary | ICD-10-CM | POA: Diagnosis not present

## 2021-10-30 LAB — BAYER DCA HB A1C WAIVED: HB A1C (BAYER DCA - WAIVED): 7.2 % — ABNORMAL HIGH (ref 4.8–5.6)

## 2021-10-30 NOTE — Progress Notes (Signed)
BP 128/71   Pulse 79   Temp 98 F (36.7 C)   Ht _0  (1.676 m)   Wt 226 lb (102.5 kg)   SpO2 97%   BMI 36.48 kg/m    Subjective:   Patient ID: [name], male    DOB: [birth year], 81 y.o.   MRN: [id number]  HPI: [name] is a 81 y.o. male presenting on 10/30/2021 for Medical Management of Chronic Issues, Hyperlipidemia, Hypertension, and Diabetes   HPI Type 2 diabetes mellitus Patient comes in today for recheck of his diabetes. Patient has been currently taking Basaglar 56 and Humalog 20 3 times daily. Patient is not currently on an ACE inhibitor/ARB due to allergy. Patient has seen an ophthalmologist this year. Patient denies any new issues with their feet. The symptom started onset as an adult hypertension and hyperlipidemia and CAD aortic atherosclerosis ARE RELATED TO DM   Hypertension Patient is currently on amlodipine and chlorthalidone and furosemide and Imdur, and their blood pressure today is 128/71. Patient denies any lightheadedness or dizziness. Patient denies headaches, blurred vision, chest pains, shortness of breath, or weakness. Denies any side effects from medication and is content with current medication.   Hyperlipidemia Patient is coming in for recheck of his hyperlipidemia. The patient is currently taking Lipitor. They deny any issues with myalgias or history of liver damage from it. They deny any focal numbness or weakness or chest pain.   Relevant past medical, surgical, family and social history reviewed and updated as indicated. Interim medical history since our last visit reviewed. Allergies and medications reviewed and updated.  Review of Systems  Constitutional:  Negative for chills and fever.  Eyes:  Negative for visual disturbance.  Respiratory:  Negative for shortness of breath and wheezing.   Cardiovascular:  Negative for chest pain and leg swelling.  Musculoskeletal:  Negative for back pain and gait problem.  Skin:  Negative  for rash.  Neurological:  Negative for dizziness, weakness and light-headedness.  All other systems reviewed and are negative.   Per HPI unless specifically indicated above   Allergies as of 10/30/2021       Reactions   Ace Inhibitors Hives, Swelling, Rash   Rash,hives,tongue swelling   Angiotensin Receptor Blockers    Unknown reaction    Oxycodone-acetaminophen Other (See Comments)   Unknown reaction   Robaxin [methocarbamol] Other (See Comments)   Confusion    Toradol [ketorolac Tromethamine] Other (See Comments)   confusion   Valium Other (See Comments)   Hallucinations; confusion   Doxycycline Hives, Rash        Medication List        Accurate as of October 30, 2021  8:55 AM. If you have any questions, ask your nurse or doctor.          acetaminophen 650 MG CR tablet Commonly known as: TYLENOL Take 1,300 mg by mouth 2 (two) times daily.   amLODipine 5 MG tablet Commonly known as: NORVASC TAKE 1 TABLET BY MOUTH DAILY   aspirin EC 81 MG tablet Take 81 mg by mouth at bedtime.   atorvastatin 40 MG tablet Commonly known as: LIPITOR TAKE 1 TABLET BY MOUTH IN  THE EVENING   Basaglar KwikPen 100 UNIT/ML Inject 56 Units into the skin at bedtime.   butalbital-acetaminophen-caffeine 50-325-40 MG tablet Commonly known as: FIORICET Take 1 tablet by mouth 2 (two) times daily as needed for headache.   cetirizine 10 MG tablet Commonly known as: ZYRTEC Take  10 mg by mouth daily with supper.   chlorthalidone 25 MG tablet Commonly known as: HYGROTON TAKE 1 TABLET BY MOUTH  DAILY   cyanocobalamin 1000 MCG tablet Commonly known as: VITAMIN B12 Take 2,000 mcg by mouth daily.   Dexcom G7 Receiver Devi Use to test blood sugar continuously.  DX: E11.65 patient to use good rx card; receiver only   Dexcom G7 Sensor Misc by Does not apply route. APPLY SENSOR TO ARM EVERY 10 DAYS. SENT TO [facility]   docusate sodium 100 MG capsule Commonly known  as: COLACE Take 100 mg by mouth daily as needed for mild constipation or moderate constipation.   donepezil 5 MG tablet Commonly known as: ARICEPT Take 1 tablet (5 mg total) by mouth at bedtime.   famotidine 20 MG tablet Commonly known as: PEPCID Take 1 tablet (20 mg total) by mouth 2 (two) times daily with a meal.   Fish Oil 1200 MG Caps Take 1,200-2,400 mg by mouth See admin instructions. Tale 1200 mg in the morning and 2400 mg in the evening   furosemide 20 MG tablet Commonly known as: LASIX TAKE 1 TABLET BY MOUTH  DAILY AS NEEDED   insulin lispro 100 UNIT/ML KwikPen Commonly known as: HumaLOG KwikPen Inject 20 Units into the skin 3 (three) times daily with meals.   isosorbide mononitrate 30 MG 24 hr tablet Commonly known as: IMDUR TAKE 1 TABLET BY MOUTH IN THE  EVENING   metFORMIN 500 MG 24 hr tablet Commonly known as: GLUCOPHAGE-XR Take 2 tablets (1,000 mg total) by mouth 2 (two) times daily.   nitroGLYCERIN 0.4 MG SL tablet Commonly known as: NITROSTAT DISSOLVE ONE TABLET UNDER THE TONGUE EVERY 5 MINUTES AS NEEDED FOR CHEST PAIN.  DO NOT EXCEED A TOTAL OF 3 DOSES IN 15 MINUTES   OneTouch Ultra test strip Generic drug: glucose blood Use to test blood sugar 4 times daily as directed. Dx E11.59   PARoxetine 20 MG tablet Commonly known as: PAXIL Take 1 tablet (20 mg total) by mouth daily.   Pfizer-BioNT COVID-19 Vac-TriS Susp injection Generic drug: COVID-19 mRNA Vac-TriS (Pfizer)   Semaglutide (1 MG/DOSE) 4 MG/3ML Sopn Inject 1 mg as directed once a week. What changed: how much to take   tamsulosin 0.4 MG Caps capsule Commonly known as: FLOMAX Take 1 capsule (0.4 mg total) by mouth daily.   Turmeric Curcumin 500 MG Caps Take 500 mg by mouth daily.   Vitamin D3 50 MCG (2000 UT) capsule Take 4,000 Units by mouth daily.         Objective:   BP 128/71   Pulse 79   Temp 98 F (36.7 C)   Ht _0  (1.676 m)   Wt 226 lb (102.5 kg)   SpO2 97%   BMI  36.48 kg/m   Wt Readings from Last 3 Encounters:  10/30/21 226 lb (102.5 kg)  08/20/21 230 lb (104.3 kg)  08/19/21 229 lb 9.6 oz (104.1 kg)    Physical Exam Vitals and nursing note reviewed.  Constitutional:      General: He is not in acute distress.    Appearance: He is well-developed. He is not diaphoretic.  Eyes:     General: No scleral icterus.    Conjunctiva/sclera: Conjunctivae normal.  Neck:     Thyroid: No thyromegaly.  Cardiovascular:     Rate and Rhythm: Normal rate and regular rhythm.     Heart sounds: Normal heart sounds. No murmur heard. Pulmonary:  Effort: Pulmonary effort is normal. No respiratory distress.     Breath sounds: Normal breath sounds. No wheezing.  Musculoskeletal:        General: Normal range of motion.     Cervical back: Neck supple.  Lymphadenopathy:     Cervical: No cervical adenopathy.  Skin:    General: Skin is warm and dry.     Findings: No rash.  Neurological:     Mental Status: He is alert and oriented to person, place, and time.     Coordination: Coordination normal.  Psychiatric:        Behavior: Behavior normal.       Assessment & Plan:   Problem List Items Addressed This Visit       Cardiovascular and Mediastinum   Hypertension associated with diabetes ([place])   Relevant Orders   CBC with Differential/Platelet   CMP14+EGFR   Lipid panel   Bayer DCA Hb A1c Waived     Endocrine   Hyperlipidemia associated with type 2 diabetes mellitus ([place])   Relevant Orders   CBC with Differential/Platelet   CMP14+EGFR   Lipid panel   Bayer DCA Hb A1c Waived   Type 2 diabetes mellitus with circulatory disorder (HCC) - Primary   Relevant Orders   CBC with Differential/Platelet   CMP14+EGFR   Lipid panel   Bayer DCA Hb A1c Waived   Microalbuminuria due to type 2 diabetes mellitus ([place])   Other Visit Diagnoses     Need for immunization against influenza       Relevant Orders   Flu Vaccine QUAD High Dose(Fluad) (Completed)        A1c is 7.2, looks better.  No changes. Follow up plan: Return in about 3 months (around 01/30/2022), or if symptoms worsen or fail to improve, for Diabetes and hypertension and cholesterol.  Counseling provided for all of the vaccine components Orders Placed This Encounter  Procedures   Flu Vaccine QUAD High Dose(Fluad)   CBC with Differential/Platelet   CMP14+EGFR   Lipid panel   Bayer DCA Hb A1c Waived    Caryl Pina, MD [facility] 10/30/2021, 8:55 AM

## 2021-10-31 LAB — LIPID PANEL
Chol/HDL Ratio: 3.5 ratio (ref 0.0–5.0)
Cholesterol, Total: 118 mg/dL (ref 100–199)
HDL: 34 mg/dL — ABNORMAL LOW (ref >39)
LDL Chol Calc (NIH): 44 mg/dL (ref 0–99)
Triglycerides: 254 mg/dL — ABNORMAL HIGH (ref 0–149)
VLDL Cholesterol Cal: 40 mg/dL (ref 5–40)

## 2021-10-31 LAB — CBC WITH DIFFERENTIAL/PLATELET
Basophils Absolute: 0.1 10*3/uL (ref 0.0–0.2)
Basos: 1 %
EOS (ABSOLUTE): 0.2 10*3/uL (ref 0.0–0.4)
Eos: 2 %
Hematocrit: 42.3 % (ref 37.5–51.0)
Hemoglobin: 13.8 g/dL (ref 13.0–17.7)
Immature Grans (Abs): 0 10*3/uL (ref 0.0–0.1)
Immature Granulocytes: 0 %
Lymphocytes Absolute: 1.9 10*3/uL (ref 0.7–3.1)
Lymphs: 24 %
MCH: 28.8 pg (ref 26.6–33.0)
MCHC: 32.6 g/dL (ref 31.5–35.7)
MCV: 88 fL (ref 79–97)
Monocytes Absolute: 0.5 10*3/uL (ref 0.1–0.9)
Monocytes: 7 %
Neutrophils Absolute: 5.2 10*3/uL (ref 1.4–7.0)
Neutrophils: 66 %
Platelets: 276 10*3/uL (ref 150–450)
RBC: 4.8 x10E6/uL (ref 4.14–5.80)
RDW: 14.3 % (ref 11.6–15.4)
WBC: 7.8 10*3/uL (ref 3.4–10.8)

## 2021-10-31 LAB — CMP14+EGFR
ALT: 19 IU/L (ref 0–44)
AST: 22 IU/L (ref 0–40)
Albumin/Globulin Ratio: 1.5 (ref 1.2–2.2)
Albumin: 4.1 g/dL (ref 3.7–4.7)
Alkaline Phosphatase: 70 IU/L (ref 44–121)
BUN/Creatinine Ratio: 15 (ref 10–24)
BUN: 18 mg/dL (ref 8–27)
Bilirubin Total: 0.4 mg/dL (ref 0.0–1.2)
CO2: 19 mmol/L — ABNORMAL LOW (ref 20–29)
Calcium: 9.6 mg/dL (ref 8.6–10.2)
Chloride: 100 mmol/L (ref 96–106)
Creatinine, Ser: 1.21 mg/dL (ref 0.76–1.27)
Globulin, Total: 2.8 g/dL (ref 1.5–4.5)
Glucose: 161 mg/dL — ABNORMAL HIGH (ref 70–99)
Potassium: 3.6 mmol/L (ref 3.5–5.2)
Sodium: 138 mmol/L (ref 134–144)
Total Protein: 6.9 g/dL (ref 6.0–8.5)
eGFR: 60 mL/min/{1.73_m2} (ref >59)

## 2021-11-02 DIAGNOSIS — Z794 Long term (current) use of insulin: Secondary | ICD-10-CM | POA: Diagnosis not present

## 2021-11-02 DIAGNOSIS — E1165 Type 2 diabetes mellitus with hyperglycemia: Secondary | ICD-10-CM | POA: Diagnosis not present

## 2021-11-14 DIAGNOSIS — Z794 Long term (current) use of insulin: Secondary | ICD-10-CM | POA: Diagnosis not present

## 2021-11-14 DIAGNOSIS — E1165 Type 2 diabetes mellitus with hyperglycemia: Secondary | ICD-10-CM | POA: Diagnosis not present

## 2021-11-25 ENCOUNTER — Other Ambulatory Visit: Payer: Self-pay | Admitting: Cardiology

## 2021-11-25 DIAGNOSIS — R42 Dizziness and giddiness: Secondary | ICD-10-CM

## 2021-11-25 DIAGNOSIS — I25119 Atherosclerotic heart disease of native coronary artery with unspecified angina pectoris: Secondary | ICD-10-CM

## 2021-11-25 DIAGNOSIS — E782 Mixed hyperlipidemia: Secondary | ICD-10-CM

## 2021-11-25 DIAGNOSIS — Z952 Presence of prosthetic heart valve: Secondary | ICD-10-CM

## 2021-11-25 DIAGNOSIS — I359 Nonrheumatic aortic valve disorder, unspecified: Secondary | ICD-10-CM

## 2021-12-09 ENCOUNTER — Encounter: Payer: Self-pay | Admitting: Pharmacist

## 2021-12-10 ENCOUNTER — Ambulatory Visit: Payer: Medicare Other | Attending: Cardiology

## 2021-12-10 DIAGNOSIS — I359 Nonrheumatic aortic valve disorder, unspecified: Secondary | ICD-10-CM

## 2021-12-10 LAB — ECHOCARDIOGRAM COMPLETE
AR max vel: 1.86 cm2
AV Area VTI: 2.17 cm2
AV Area mean vel: 1.93 cm2
AV Mean grad: 8.5 mmHg
AV Peak grad: 15.6 mmHg
Ao pk vel: 1.97 m/s
Area-P 1/2: 4.63 cm2
Calc EF: 58.9 %
S' Lateral: 2.7 cm
Single Plane A2C EF: 57 %
Single Plane A4C EF: 59 %

## 2021-12-13 NOTE — Progress Notes (Unsigned)
Cardiology Office Note  Date: 12/14/2021   ID: [name], DOB 02-01-1940, MRN [id number]  PCP:  Dettinger, Fransisca Kaufmann, MD  Cardiologist:  Rozann Lesches, MD Electrophysiologist:  None   Chief Complaint  Patient presents with   Cardiac follow-up    History of Present Illness: [name] is an 81 y.o. male last seen in August.  He is here for a routine visit.  Reports stable NYHA class II dyspnea, no exertional chest pain, no palpitations or frank syncope.  Still occasionally feels lightheaded when he stands quickly.  Recent follow-up echocardiogram in November revealed LVEF 65 to 70% with mild diastolic dysfunction, stable TAVR with no description of paravalvular regurgitation and mean AV gradient of approximately 9 mmHg.  I reviewed his medications which are outlined below.  He did have lab work with PCP recently, LDL was 44.  Past Medical History:  Diagnosis Date   Anxiety    Aortic atherosclerosis ([place]) 12/19/2017   Aortic stenosis    26 mm Edwards S3U THV November 2021   Arthritis    Bursitis of hip    Cervical spondylosis    Chronic headaches    Coronary atherosclerosis of native coronary artery    BMS nondominant RCA 12/2004, DES x2 to mid LAD 11/2019   Essential hypertension    GERD (gastroesophageal reflux disease)    History of adenomatous polyp of colon    History of kidney stones    History of MI (myocardial infarction) 12/2004   History of viral meningitis 02/28/2018   December 2019   Hyperlipidemia    Internal hemorrhoids    Low back pain    Nocturia more than twice per night    OSA (obstructive sleep apnea)    Spinal stenosis    Type 2 diabetes mellitus treated with insulin ([place])    UTI (urinary tract infection)    Wears dentures    Upper    Past Surgical History:  Procedure Laterality Date   CARDIAC CATHETERIZATION  06-14-2004  dr Lia Foyer   total occlusion mD1, RI 30-40%, mRCA nondominant hazy 80% and scattered 30-40%,  ef 71%  (medical mangement)   CARDIOVASCULAR STRESS TEST  09-17-2015   dr Domenic Polite   Low risk nuclear study w/ reversible mild small anteroapical defect,  normal LV function and wall motion, nuclear stress ef 61%   CATARACT EXTRACTION W/ INTRAOCULAR LENS IMPLANT Right 07/2014   COLONOSCOPY  09/10/2008   normal   CORONARY ANGIOPLASTY WITH STENT PLACEMENT  12-17-2004   dr benismhon/ dr Olevia Perches   nonobstructive cad LAD and LCFx,  BMS x1 to total occlusion RCA    CORONARY ATHERECTOMY  11/22/2019   CORONARY ATHERECTOMY N/A 11/22/2019   Procedure: CORONARY ATHERECTOMY;  Surgeon: Burnell Blanks, MD;  Location: [facility];  Service: Cardiovascular;  Laterality: N/A;   CYSTOSCOPY W/ URETEROSCOPY W/ LITHOTRIPSY  08/2010   ERCP  03/14/2008   EYE SURGERY Bilateral    cataracts   LAPAROSCOPIC CHOLECYSTECTOMY  05/2012   LEFT HEART CATHETERIZATION WITH CORONARY ANGIOGRAM N/A 05/14/2011   Procedure: LEFT HEART CATHETERIZATION WITH CORONARY ANGIOGRAM;  Surgeon: Sherren Mocha, MD;  [facility];  Service: Cardiovascular;  Laterality: N/A;    non-obstructive LM, LAD, LCFx, patent RCA stent   LUMBAR LAMINECTOMY/DECOMPRESSION MICRODISCECTOMY N/A 02/24/2017   Procedure: Microlumbar decompression L4-5, L5-S1;  Surgeon: Susa Day, MD;  Location: [facility];  Service: Orthopedics;  Laterality: N/A;  120 mins   RIGHT/LEFT HEART CATH AND CORONARY  ANGIOGRAPHY N/A 11/14/2019   Procedure: RIGHT/LEFT HEART CATH AND CORONARY ANGIOGRAPHY;  Surgeon: Burnell Blanks, MD;  Location: [facility];  Service: Cardiovascular;  Laterality: N/A;   ROTATOR CUFF REPAIR Right 03/2002   ROTATOR CUFF REPAIR Left 12/11/2015   TEE WITHOUT CARDIOVERSION N/A 12/04/2019   Procedure: TRANSESOPHAGEAL ECHOCARDIOGRAM (TEE);  Surgeon: Burnell Blanks, MD;  Location: [facility];  Service: Open Heart Surgery;  Laterality: N/A;   TOTAL HIP ARTHROPLASTY Right 12/20/2008   TOTAL HIP ARTHROPLASTY Left  07/05/2018   Procedure: TOTAL HIP ARTHROPLASTY ANTERIOR APPROACH;  Surgeon: Gaynelle Arabian, MD;  Location: [facility];  Service: Orthopedics;  Laterality: Left;  151mn   TOTAL KNEE ARTHROPLASTY Bilateral left 12-11-2003/  right 07-31-2004   [facility]  TRANSCATHETER AORTIC VALVE REPLACEMENT, TRANSFEMORAL N/A 12/04/2019   Procedure: TRANSCATHETER AORTIC VALVE REPLACEMENT, TRANSFEMORAL;  Surgeon: MBurnell Blanks MD;  [facility];  Service: Open Heart Surgery;  Laterality: N/A;   UPPER GASTROINTESTINAL ENDOSCOPY  01/08/2008   bx, inlet patch, duodenitis   YAG LASER APPLICATION Right 74/58/0998  Procedure: YAG LASER APPLICATION;  Surgeon: CWilliams Che MD;  Location: [facility];  Service: Ophthalmology;  Laterality: Right;    Current Outpatient Medications  Medication Sig Dispense Refill   acetaminophen (TYLENOL) 650 MG CR tablet Take 1,300 mg by mouth 2 (two) times daily.     amLODipine (NORVASC) 5 MG tablet TAKE 1 TABLET BY MOUTH DAILY 100 tablet 2   aspirin EC 81 MG tablet Take 81 mg by mouth at bedtime.      atorvastatin (LIPITOR) 40 MG tablet TAKE 1 TABLET BY MOUTH IN  THE EVENING 90 tablet 3   butalbital-acetaminophen-caffeine (FIORICET) 50-325-40 MG tablet Take 1 tablet by mouth 2 (two) times daily as needed for headache.     cetirizine (ZYRTEC) 10 MG tablet Take 10 mg by mouth daily with supper.      chlorthalidone (HYGROTON) 25 MG tablet TAKE 1 TABLET BY MOUTH  DAILY 90 tablet 3   Cholecalciferol (VITAMIN D3) 50 MCG (2000 UT) capsule Take 4,000 Units by mouth daily.      Continuous Blood Gluc Receiver (DEXCOM G7 RECEIVER) DEVI Use to test blood sugar continuously.  DX: E11.65 patient to use good rx card; receiver only 1 each 0   Continuous Blood Gluc Sensor (DSan Pablo MISC by Does not apply route. APPLY SENSOR TO ARM EVERY 10 DAYS. SENT TO [facility]     docusate sodium (COLACE) 100 MG capsule Take 100 mg by mouth daily as needed for  mild constipation or moderate constipation.      donepezil (ARICEPT) 5 MG tablet Take 1 tablet (5 mg total) by mouth at bedtime. 90 tablet 3   famotidine (PEPCID) 20 MG tablet Take 1 tablet (20 mg total) by mouth 2 (two) times daily with a meal. 180 tablet 3   furosemide (LASIX) 20 MG tablet TAKE 1 TABLET BY MOUTH  DAILY AS NEEDED 90 tablet 3   glucose blood (ONETOUCH ULTRA) test strip Use to test blood sugar 4 times daily as directed. Dx E11.59 400 each 3   Insulin Glargine (BASAGLAR KWIKPEN) 100 UNIT/ML Inject 56 Units into the skin at bedtime. 45 mL 5   insulin lispro (HUMALOG KWIKPEN) 100 UNIT/ML KwikPen Inject 20 Units into the skin 3 (three) times daily with meals. 45 mL 11   isosorbide mononitrate (IMDUR) 30 MG 24 hr tablet TAKE 1 TABLET BY  MOUTH IN THE  EVENING 100 tablet 2   metFORMIN (GLUCOPHAGE-XR) 500 MG 24 hr tablet Take 2 tablets (1,000 mg total) by mouth 2 (two) times daily. 360 tablet 3   nitroGLYCERIN (NITROSTAT) 0.4 MG SL tablet DISSOLVE ONE TABLET UNDER THE TONGUE EVERY 5 MINUTES AS NEEDED FOR CHEST PAIN.  DO NOT EXCEED A TOTAL OF 3 DOSES IN 15 MINUTES 25 tablet 3   Omega-3 Fatty Acids (FISH OIL) 1200 MG CAPS Take 1,200-2,400 mg by mouth See admin instructions. Tale 1200 mg in the morning and 2400 mg in the evening     PARoxetine (PAXIL) 20 MG tablet Take 1 tablet (20 mg total) by mouth daily. 90 tablet 3   PFIZER-BIONT COVID-19 VAC-TRIS SUSP injection      Semaglutide, 1 MG/DOSE, 4 MG/3ML SOPN Inject 1 mg as directed once a week. (Patient taking differently: Inject 2 mg as directed once a week.) 9 mL 3   tamsulosin (FLOMAX) 0.4 MG CAPS capsule Take 1 capsule (0.4 mg total) by mouth daily. 90 capsule 3   Turmeric Curcumin 500 MG CAPS Take 500 mg by mouth daily.     vitamin B-12 (CYANOCOBALAMIN) 1000 MCG tablet Take 2,000 mcg by mouth daily.     No current facility-administered medications for this visit.   Allergies:  Ace inhibitors, Angiotensin receptor blockers,  Oxycodone-acetaminophen, Robaxin [methocarbamol], Toradol [ketorolac tromethamine], Valium, and Doxycycline   ROS:  No frank syncope.  Physical Exam: VS:  BP 132/68   Pulse 77   Ht '5\' 6"'$  (1.676 m)   Wt 227 lb (103 kg)   SpO2 98%   BMI 36.64 kg/m , BMI Body mass index is 36.64 kg/m.  Wt Readings from Last 3 Encounters:  12/14/21 227 lb (103 kg)  10/30/21 226 lb (102.5 kg)  08/20/21 230 lb (104.3 kg)    General: Patient appears comfortable at rest. HEENT: Conjunctiva and lids normal. Neck: Supple, no elevated JVP or carotid bruits. Lungs: Clear to auscultation, nonlabored breathing at rest. Cardiac: Regular rate and rhythm, no S3, 2/6 systolic murmur. Extremities: No pitting edema.  ECG:  An ECG dated 08/19/2021 was personally reviewed today and demonstrated:  Sinus rhythm.  Recent Labwork: 10/30/2021: ALT 19; AST 22; BUN 18; Creatinine, Ser 1.21; Hemoglobin 13.8; Platelets 276; Potassium 3.6; Sodium 138     Component Value Date/Time   CHOL 118 10/30/2021 0825   CHOL 139 06/02/2012 1007   TRIG 254 (H) 10/30/2021 0825   TRIG 185 (H) 04/10/2013 0937   TRIG 107 06/02/2012 1007   HDL 34 (L) 10/30/2021 0825   HDL 34 (L) 04/10/2013 0937   HDL 40 06/02/2012 1007   CHOLHDL 3.5 10/30/2021 0825   CHOLHDL 3.8 11/16/2019 0206   VLDL 25 11/16/2019 0206   LDLCALC 44 10/30/2021 0825   LDLCALC 62 04/10/2013 0937   LDLCALC 78 06/02/2012 1007    Other Studies Reviewed Today:  Echocardiogram 12/10/2021:  1. Left ventricular ejection fraction, by estimation, is 65 to 70%. The  left ventricle has normal function. The left ventricle has no regional  wall motion abnormalities. There is mild left ventricular hypertrophy.  Left ventricular diastolic parameters  are consistent with Grade I diastolic dysfunction (impaired relaxation).  The average left ventricular global longitudinal strain is -23.2 %. The  global longitudinal strain is normal.   2. Right ventricular systolic function is  normal. The right ventricular  size is normal.   3. The mitral valve is normal in structure. No evidence of mitral valve  regurgitation. No evidence of mitral stenosis.   4. 26 mm Edwards SAPIEN TAVR valve is present in the AV position. The  aortic valve has been repaired/replaced. Aortic valve regurgitation is not  visualized. No aortic stenosis is present.   5. The inferior vena cava is normal in size with greater than 50%  respiratory variability, suggesting right atrial pressure of 3 mmHg.   Assessment and Plan:  1.  History of severe degenerative calcific aortic stenosis status post TAVR in November 2021.  He is doing well, recent follow-up echocardiogram showed stable mean AV gradients of approximately 9 mmHg and no description of paravalvular regurgitation.  He remains on aspirin.  2.  CAD status post BMS to nondominant RCA in 2006 as well as DES x2 to the LAD in November 2021.  He reports no active angina.  Continue aspirin, Imdur, Lipitor, and as needed nitroglycerin.  3.  Mixed hyperlipidemia on Lipitor.  Recent LDL 44.  Medication Adjustments/Labs and Tests Ordered: Current medicines are reviewed at length with the patient today.  Concerns regarding medicines are outlined above.   Tests Ordered: No orders of the defined types were placed in this encounter.   Medication Changes: No orders of the defined types were placed in this encounter.   Disposition:  Follow up  6 months.  Signed, Satira Sark, MD, [facility] 12/14/2021 9:33 AM    [facility], [place], [place] [address] Phone: [phone]; Fax: [phone]

## 2021-12-14 ENCOUNTER — Ambulatory Visit: Payer: Medicare Other | Attending: Cardiology | Admitting: Cardiology

## 2021-12-14 ENCOUNTER — Encounter: Payer: Self-pay | Admitting: Cardiology

## 2021-12-14 VITALS — BP 132/68 | HR 77 | Ht 66.0 in | Wt 227.0 lb

## 2021-12-14 DIAGNOSIS — Z952 Presence of prosthetic heart valve: Secondary | ICD-10-CM

## 2021-12-14 DIAGNOSIS — I25119 Atherosclerotic heart disease of native coronary artery with unspecified angina pectoris: Secondary | ICD-10-CM

## 2021-12-14 DIAGNOSIS — E782 Mixed hyperlipidemia: Secondary | ICD-10-CM | POA: Diagnosis not present

## 2021-12-14 NOTE — Patient Instructions (Addendum)

## 2021-12-17 ENCOUNTER — Other Ambulatory Visit: Payer: Self-pay | Admitting: Cardiology

## 2021-12-19 ENCOUNTER — Other Ambulatory Visit: Payer: Self-pay | Admitting: Cardiology

## 2021-12-24 ENCOUNTER — Telehealth: Payer: Self-pay | Admitting: Pharmacist

## 2021-12-24 ENCOUNTER — Ambulatory Visit (INDEPENDENT_AMBULATORY_CARE_PROVIDER_SITE_OTHER): Payer: Medicare Other | Admitting: Pharmacist

## 2021-12-24 DIAGNOSIS — E1159 Type 2 diabetes mellitus with other circulatory complications: Secondary | ICD-10-CM | POA: Diagnosis not present

## 2021-12-24 DIAGNOSIS — Z794 Long term (current) use of insulin: Secondary | ICD-10-CM

## 2021-12-24 NOTE — Progress Notes (Signed)
    12/24/2021 Name: [name] MRN: [id number] DOB: 1940/07/27   S:  24 yoM Presents for diabetes evaluation, education, and management Patient was referred and last seen by Primary Care Provider on 10/30/21.  His A1c and glycemic control has improved with increase in Ozempic to '2mg'$  weekly.  He is tolerating medications well and enjoying his new Dexcom G7 via [facility] health DME.  Patient needs to be re-enrolled for patient assistance for 2024.  Insurance coverage/medication affordability: [employer] medicare  Patient reports adherence with medications. Current diabetes medications include: basaglar, humalog, ozempic, metformin Current hypertension medications include: norvasc, chlorthalidone, ismo,  Goal 130/80 Current hyperlipidemia medications include: atorva    Patient denies hypoglycemic events.   Patient reported dietary habits: Eats 2-3 meals/day Discussed meal planning options and Plate method for healthy eating Avoid sugary drinks and desserts Incorporate balanced protein, non starchy veggies, 1 serving of carbohydrate with each meal Increase water intake Increase physical activity as able   Patient-reported exercise habits: n/a   Patient reports nocturia (nighttime urination).  Patient denies neuropathy (nerve pain).  Patient reports visual changes.  Patient reports self foot exams.    O:  Lab Results  Component Value Date   HGBA1C 7.2 (H) 10/30/2021   Lipid Panel     Component Value Date/Time   CHOL 118 10/30/2021 0825   CHOL 139 06/02/2012 1007   TRIG 254 (H) 10/30/2021 0825   TRIG 185 (H) 04/10/2013 0937   TRIG 107 06/02/2012 1007   HDL 34 (L) 10/30/2021 0825   HDL 34 (L) 04/10/2013 0937   HDL 40 06/02/2012 1007   CHOLHDL 3.5 10/30/2021 0825   CHOLHDL 3.8 11/16/2019 0206   VLDL 25 11/16/2019 0206   LDLCALC 44 10/30/2021 0825   LDLCALC 62 04/10/2013 0937   LDLCALC 78 06/02/2012 1007     Home fasting blood sugars: <150  2 hour  post-meal/random blood sugars: <200.    Clinical Atherosclerotic Cardiovascular Disease (ASCVD): No   The ASCVD Risk score (Arnett DK, et al., 2019) failed to calculate for the following reasons:   The 2019 ASCVD risk score is only valid for ages 22 to 69    A/P:      Diabetes: Uncontrolled (a1c 7.3-->7.7%); current treatment: BASAGLAR 50-56 UNITS, HUMALOG KWIKPEN 10-20 UNITS TID MEALS, METFORMIN 2G PER DAY; Ozempic '2mg'$  weekly GFR 65-->62-->60 CONTINUE TO WORK ON DIET Continue Ozempic to '2mg'$  weekly Continue Basaglar to 50-56 units Continue Humalog meal time to 10-20 units Current glucose readings: fasting glucose: <200, post prandial glucose: 180-200s Patient now wearing Dexcom G7 Order sent to [facility] via [facility] portal 08/2021--for 1 yr Denies hypoglycemic/hyperglycemic symptoms Discussed meal planning options and Plate method for healthy eating Avoid sugary drinks and desserts Incorporate balanced protein, non starchy veggies, 1 serving of carbohydrate with each meal Increase water intake Increase physical activity as able Current exercise: N/A Educated on DEXCOM, REFILLS CALLED IN discussed dexcom g7--should be available in the next month Assessed patient finances. Will re-enroll for [facility] BASAGLAR [facility] HUMALOG--and novo nordisk PAP for ozempic  -Counseled on s/sx of and management of hypoglycemia  -Next A1C anticipated 3 months.   Written patient instructions provided.  Total time in counseling 20 minutes.   Follow up Pharmacist/PCP Clinic Visit ON 02/01/22.    Regina Eck, PharmD, BCPS, BCACP Clinical Pharmacist, [place]  [facility]  T [phone]

## 2021-12-24 NOTE — Telephone Encounter (Signed)
Can you make sure patient is re-enrolled for 2024:  Basaglar 56 units daily Humalog 20 units 3 times daily with meals--max daily 60 units Ozempic '2mg'$  weekly  Thank you!

## 2022-01-05 NOTE — Telephone Encounter (Signed)
Wife was picking up samples and they were not in refrig. I told her I would let you know. The nurse gave her ozempic .25 mg and told her to dial it up 4 times.

## 2022-01-05 NOTE — Telephone Encounter (Signed)
WIFE IS CHECKING ON THIS

## 2022-01-06 NOTE — Telephone Encounter (Signed)
Wife called to follow up again. Informed that paperwork has been placed in mail.   Also wanted to know if husbands ozempic '2mg'$  pen order from novo nordisk was located?

## 2022-01-22 DIAGNOSIS — G4733 Obstructive sleep apnea (adult) (pediatric): Secondary | ICD-10-CM | POA: Diagnosis not present

## 2022-02-01 ENCOUNTER — Ambulatory Visit (INDEPENDENT_AMBULATORY_CARE_PROVIDER_SITE_OTHER): Payer: Medicare Other | Admitting: Family Medicine

## 2022-02-01 ENCOUNTER — Encounter: Payer: Self-pay | Admitting: Family Medicine

## 2022-02-01 VITALS — BP 139/60 | HR 89 | Temp 97.2°F | Ht 66.0 in | Wt 226.0 lb

## 2022-02-01 DIAGNOSIS — I2510 Atherosclerotic heart disease of native coronary artery without angina pectoris: Secondary | ICD-10-CM | POA: Diagnosis not present

## 2022-02-01 DIAGNOSIS — E1169 Type 2 diabetes mellitus with other specified complication: Secondary | ICD-10-CM

## 2022-02-01 DIAGNOSIS — E1129 Type 2 diabetes mellitus with other diabetic kidney complication: Secondary | ICD-10-CM

## 2022-02-01 DIAGNOSIS — E785 Hyperlipidemia, unspecified: Secondary | ICD-10-CM

## 2022-02-01 DIAGNOSIS — R809 Proteinuria, unspecified: Secondary | ICD-10-CM | POA: Diagnosis not present

## 2022-02-01 DIAGNOSIS — I152 Hypertension secondary to endocrine disorders: Secondary | ICD-10-CM

## 2022-02-01 DIAGNOSIS — E1159 Type 2 diabetes mellitus with other circulatory complications: Secondary | ICD-10-CM

## 2022-02-01 DIAGNOSIS — Z794 Long term (current) use of insulin: Secondary | ICD-10-CM

## 2022-02-01 DIAGNOSIS — I700 Atherosclerosis of aorta: Secondary | ICD-10-CM

## 2022-02-01 LAB — BAYER DCA HB A1C WAIVED: HB A1C (BAYER DCA - WAIVED): 7.4 % — ABNORMAL HIGH (ref 4.8–5.6)

## 2022-02-01 NOTE — Progress Notes (Signed)
BP 139/60   Pulse 89   Temp (!) 97.2 F (36.2 C)   Ht '5\' 6"'$  (1.676 m)   Wt 226 lb (102.5 kg)   SpO2 97%   BMI 36.48 kg/m    Subjective:   Patient ID: [name], male    DOB: [birth year], 82 y.o.   MRN: [id number]  HPI: [name] is a 82 y.o. male presenting on 02/01/2022 for Medical Management of Chronic Issues ( ) and Diabetes   HPI Hyperlipidemia Patient is coming in for recheck of his hyperlipidemia. The patient is currently taking fish oils and Lipitor. They deny any issues with myalgias or history of liver damage from it. They deny any focal numbness or weakness or chest pain.   Hypertension Patient is currently on amlodipine and chlorthalidone and Imdur, and their blood pressure today is 139/60. Patient denies any lightheadedness or dizziness. Patient denies headaches, blurred vision, chest pains, shortness of breath, or weakness. Denies any side effects from medication and is content with current medication.   Type 2 diabetes mellitus Patient comes in today for recheck of his diabetes. Patient has been currently taking Dexcom and Basaglar and Humalog and metformin and Ozempic. Patient is not currently on an ACE inhibitor/ARB. Patient has not seen an ophthalmologist this year. Patient denies any issues with their feet. The symptom started onset as an adult CAD and microalbuminuria and hypertension and hyperlipidemia and aortic atherosclerosis ARE RELATED TO DM   Relevant past medical, surgical, family and social history reviewed and updated as indicated. Interim medical history since our last visit reviewed. Allergies and medications reviewed and updated.  Review of Systems  Constitutional:  Negative for chills and fever.  Eyes:  Negative for visual disturbance.  Respiratory:  Negative for shortness of breath and wheezing.   Cardiovascular:  Negative for chest pain and leg swelling.  Musculoskeletal:  Negative for back pain and gait problem.  Skin:   Negative for rash.  Neurological:  Negative for dizziness, weakness and light-headedness.  All other systems reviewed and are negative.   Per HPI unless specifically indicated above   Allergies as of 02/01/2022       Reactions   Ace Inhibitors Hives, Swelling, Rash   Rash,hives,tongue swelling   Angiotensin Receptor Blockers    Unknown reaction    Oxycodone-acetaminophen Other (See Comments)   Unknown reaction   Robaxin [methocarbamol] Other (See Comments)   Confusion    Toradol [ketorolac Tromethamine] Other (See Comments)   confusion   Valium Other (See Comments)   Hallucinations; confusion   Doxycycline Hives, Rash        Medication List        Accurate as of February 01, 2022  9:07 AM. If you have any questions, ask your nurse or doctor.          acetaminophen 650 MG CR tablet Commonly known as: TYLENOL Take 1,300 mg by mouth 2 (two) times daily.   amLODipine 5 MG tablet Commonly known as: NORVASC TAKE 1 TABLET BY MOUTH DAILY   aspirin EC 81 MG tablet Take 81 mg by mouth at bedtime.   atorvastatin 40 MG tablet Commonly known as: LIPITOR TAKE 1 TABLET BY MOUTH IN  THE EVENING   Basaglar KwikPen 100 UNIT/ML Inject 56 Units into the skin at bedtime.   butalbital-acetaminophen-caffeine 50-325-40 MG tablet Commonly known as: FIORICET Take 1 tablet by mouth 2 (two) times daily as needed for headache.   cetirizine 10 MG tablet Commonly known  as: ZYRTEC Take 10 mg by mouth daily with supper.   chlorthalidone 25 MG tablet Commonly known as: HYGROTON TAKE 1 TABLET BY MOUTH DAILY   cyanocobalamin 1000 MCG tablet Commonly known as: VITAMIN B12 Take 2,000 mcg by mouth daily.   Dexcom G7 Receiver Devi Use to test blood sugar continuously.  DX: E11.65 patient to use good rx card; receiver only   Dexcom G7 Sensor Misc by Does not apply route. APPLY SENSOR TO ARM EVERY 10 DAYS. SENT TO [facility]   docusate sodium 100 MG  capsule Commonly known as: COLACE Take 100 mg by mouth daily as needed for mild constipation or moderate constipation.   donepezil 5 MG tablet Commonly known as: ARICEPT Take 1 tablet (5 mg total) by mouth at bedtime.   famotidine 20 MG tablet Commonly known as: PEPCID Take 1 tablet (20 mg total) by mouth 2 (two) times daily with a meal.   Fish Oil 1200 MG Caps Take 1,200-2,400 mg by mouth See admin instructions. Tale 1200 mg in the morning and 2400 mg in the evening   furosemide 20 MG tablet Commonly known as: LASIX TAKE 1 TABLET BY MOUTH  DAILY AS NEEDED   insulin lispro 100 UNIT/ML KwikPen Commonly known as: HumaLOG KwikPen Inject 20 Units into the skin 3 (three) times daily with meals.   isosorbide mononitrate 30 MG 24 hr tablet Commonly known as: IMDUR TAKE 1 TABLET BY MOUTH IN THE  EVENING   metFORMIN 500 MG 24 hr tablet Commonly known as: GLUCOPHAGE-XR Take 2 tablets (1,000 mg total) by mouth 2 (two) times daily.   nitroGLYCERIN 0.4 MG SL tablet Commonly known as: NITROSTAT DISSOLVE ONE TABLET UNDER THE TONGUE EVERY 5 MINUTES AS NEEDED FOR CHEST PAIN.  DO NOT EXCEED A TOTAL OF 3 DOSES IN 15 MINUTES   OneTouch Ultra test strip Generic drug: glucose blood Use to test blood sugar 4 times daily as directed. Dx E11.59   PARoxetine 20 MG tablet Commonly known as: PAXIL Take 1 tablet (20 mg total) by mouth daily.   Pfizer-BioNT COVID-19 Vac-TriS Susp injection Generic drug: COVID-19 mRNA Vac-TriS (Pfizer)   Semaglutide (1 MG/DOSE) 4 MG/3ML Sopn Inject 1 mg as directed once a week. What changed: how much to take   tamsulosin 0.4 MG Caps capsule Commonly known as: FLOMAX Take 1 capsule (0.4 mg total) by mouth daily.   Turmeric Curcumin 500 MG Caps Take 500 mg by mouth daily.   Vitamin D3 50 MCG (2000 UT) capsule Take 4,000 Units by mouth daily.         Objective:   BP 139/60   Pulse 89   Temp (!) 97.2 F (36.2 C)   Ht '5\' 6"'$  (1.676 m)   Wt 226 lb  (102.5 kg)   SpO2 97%   BMI 36.48 kg/m   Wt Readings from Last 3 Encounters:  02/01/22 226 lb (102.5 kg)  12/14/21 227 lb (103 kg)  10/30/21 226 lb (102.5 kg)    Physical Exam Vitals and nursing note reviewed.  Constitutional:      General: He is not in acute distress.    Appearance: He is well-developed. He is not diaphoretic.  Eyes:     General: No scleral icterus.    Conjunctiva/sclera: Conjunctivae normal.  Neck:     Thyroid: No thyromegaly.  Cardiovascular:     Rate and Rhythm: Normal rate and regular rhythm.     Heart sounds: Normal heart sounds. No murmur heard. Pulmonary:  Effort: Pulmonary effort is normal. No respiratory distress.     Breath sounds: Normal breath sounds. No wheezing.  Musculoskeletal:        General: No swelling. Normal range of motion.     Cervical back: Neck supple.  Lymphadenopathy:     Cervical: No cervical adenopathy.  Skin:    General: Skin is warm and dry.     Findings: No rash.  Neurological:     Mental Status: He is alert and oriented to person, place, and time.     Coordination: Coordination normal.  Psychiatric:        Behavior: Behavior normal.       Assessment & Plan:   Problem List Items Addressed This Visit       Cardiovascular and Mediastinum   Hypertension associated with diabetes ([place])   Relevant Orders   Bayer DCA Hb A1c Waived   CORONARY ATHEROSCLEROSIS NATIVE CORONARY ARTERY   Aortic atherosclerosis ([facility])     Endocrine   Hyperlipidemia associated with type 2 diabetes mellitus ([place])   Relevant Orders   Bayer DCA Hb A1c Waived   Type 2 diabetes mellitus with circulatory disorder ([facility]CC) - Primary   Relevant Orders   Bayer DCA Hb A1c Waived   Microalbuminuria due to type 2 diabetes mellitus ([facility])    A1c is about the same at 7.4.  Not going to change any medicine because he does have the occasional hypoglycemic episode.  He can do diet lifestyle change  Blood pressure looks good, no changes Follow up  plan: Return in about 3 months (around 05/03/2022), or if symptoms worsen or fail to improve, for Diabetes hypertension cholesterol.  Counseling provided for all of the vaccine components Orders Placed This Encounter  Procedures   Bayer [place] Hb A1c Jansen Giannie Soliday, MD [facility] 02/01/2022, 9:07 AM

## 2022-02-02 ENCOUNTER — Ambulatory Visit: Payer: Medicare Other | Admitting: Dermatology

## 2022-02-07 NOTE — Progress Notes (Unsigned)
Subjective:   [name] is a 82 y.o. male who presents for Medicare Annual/Subsequent preventive examination.  Review of Systems    ***       Objective:    There were no vitals filed for this visit. There is no height or weight on file to calculate BMI.     11/28/2020    4:11 PM 07/01/2020   10:53 PM 07/01/2020   12:33 PM 11/30/2019    2:05 PM 11/22/2019   10:10 AM 11/14/2019    8:26 AM 06/07/2019    9:04 AM  Advanced Directives  Does Patient Have a Medical Advance Directive? Yes Yes No Yes Yes Yes Yes  Type of Paramedic of [place];Living will Healthcare Power of [place];Living will [place];Living will [place];Living will [place];Living will  Does patient want to make changes to medical advance directive?  No - Patient declined   No - Patient declined  No - Patient declined  Copy of [place] in Chart? Yes - validated most recent copy scanned in chart (See row information) No - copy requested  No - copy requested No - copy requested  No - copy requested  Would patient like information on creating a medical advance directive?  No - Patient declined No - Patient declined        Current Medications (verified) Outpatient Encounter Medications as of 02/08/2022  Medication Sig   acetaminophen (TYLENOL) 650 MG CR tablet Take 1,300 mg by mouth 2 (two) times daily.   amLODipine (NORVASC) 5 MG tablet TAKE 1 TABLET BY MOUTH DAILY   aspirin EC 81 MG tablet Take 81 mg by mouth at bedtime.    atorvastatin (LIPITOR) 40 MG tablet TAKE 1 TABLET BY MOUTH IN  THE EVENING   butalbital-acetaminophen-caffeine (FIORICET) 50-325-40 MG tablet Take 1 tablet by mouth 2 (two) times daily as needed for headache.   cetirizine (ZYRTEC) 10 MG tablet Take 10 mg by mouth daily with supper.    chlorthalidone (HYGROTON) 25 MG tablet TAKE 1 TABLET BY MOUTH DAILY    Cholecalciferol (VITAMIN D3) 50 MCG (2000 UT) capsule Take 4,000 Units by mouth daily.    Continuous Blood Gluc Receiver (DEXCOM G7 RECEIVER) DEVI Use to test blood sugar continuously.  DX: E11.65 patient to use good rx card; receiver only   Continuous Blood Gluc Sensor ([place]) MISC by Does not apply route. APPLY SENSOR TO ARM EVERY 10 DAYS. SENT TO [facility]   docusate sodium (COLACE) 100 MG capsule Take 100 mg by mouth daily as needed for mild constipation or moderate constipation.    donepezil (ARICEPT) 5 MG tablet Take 1 tablet (5 mg total) by mouth at bedtime.   famotidine (PEPCID) 20 MG tablet Take 1 tablet (20 mg total) by mouth 2 (two) times daily with a meal.   furosemide (LASIX) 20 MG tablet TAKE 1 TABLET BY MOUTH  DAILY AS NEEDED   glucose blood (ONETOUCH ULTRA) test strip Use to test blood sugar 4 times daily as directed. Dx E11.59   Insulin Glargine (BASAGLAR KWIKPEN) 100 UNIT/ML Inject 56 Units into the skin at bedtime.   insulin lispro (HUMALOG KWIKPEN) 100 UNIT/ML KwikPen Inject 20 Units into the skin 3 (three) times daily with meals.   isosorbide mononitrate (IMDUR) 30 MG 24 hr tablet TAKE 1 TABLET BY MOUTH IN THE  EVENING   metFORMIN (GLUCOPHAGE-XR) 500 MG  24 hr tablet Take 2 tablets (1,000 mg total) by mouth 2 (two) times daily.   nitroGLYCERIN (NITROSTAT) 0.4 MG SL tablet DISSOLVE ONE TABLET UNDER THE TONGUE EVERY 5 MINUTES AS NEEDED FOR CHEST PAIN.  DO NOT EXCEED A TOTAL OF 3 DOSES IN 15 MINUTES   Omega-3 Fatty Acids (FISH OIL) 1200 MG CAPS Take 1,200-2,400 mg by mouth See admin instructions. Tale 1200 mg in the morning and 2400 mg in the evening   PARoxetine (PAXIL) 20 MG tablet Take 1 tablet (20 mg total) by mouth daily.   PFIZER-BIONT COVID-19 VAC-TRIS SUSP injection    Semaglutide, 1 MG/DOSE, 4 MG/3ML SOPN Inject 1 mg as directed once a week. (Patient taking differently: Inject 2 mg as directed once a week.)   tamsulosin (FLOMAX) 0.4 MG CAPS  capsule Take 1 capsule (0.4 mg total) by mouth daily.   Turmeric Curcumin 500 MG CAPS Take 500 mg by mouth daily.   vitamin B-12 (CYANOCOBALAMIN) 1000 MCG tablet Take 2,000 mcg by mouth daily.   No facility-administered encounter medications on file as of 02/08/2022.    Allergies (verified) Ace inhibitors, Angiotensin receptor blockers, Oxycodone-acetaminophen, Robaxin [methocarbamol], Toradol [ketorolac tromethamine], Valium, and Doxycycline   History: Past Medical History:  Diagnosis Date   Anxiety    Aortic atherosclerosis ([place]) 12/19/2017   Aortic stenosis    26 mm Edwards S3U THV November 2021   Arthritis    Bursitis of hip    Cervical spondylosis    Chronic headaches    Coronary atherosclerosis of native coronary artery    BMS nondominant RCA 12/2004, DES x2 to mid LAD 11/2019   Essential hypertension    GERD (gastroesophageal reflux disease)    History of adenomatous polyp of colon    History of kidney stones    History of MI (myocardial infarction) 12/2004   History of viral meningitis 02/28/2018   December 2019   Hyperlipidemia    Internal hemorrhoids    Low back pain    Nocturia more than twice per night    OSA (obstructive sleep apnea)    Spinal stenosis    Type 2 diabetes mellitus treated with insulin ([place])    UTI (urinary tract infection)    Wears dentures    Upper   Past Surgical History:  Procedure Laterality Date   CARDIAC CATHETERIZATION  06-14-2004  dr Lia Foyer   total occlusion mD1, RI 30-40%, mRCA nondominant hazy 80% and scattered 30-40%,  ef 71% (medical mangement)   CARDIOVASCULAR STRESS TEST  09-17-2015   dr Domenic Polite   Low risk nuclear study w/ reversible mild small anteroapical defect,  normal LV function and wall motion, nuclear stress ef 61%   CATARACT EXTRACTION W/ INTRAOCULAR LENS IMPLANT Right 07/2014   COLONOSCOPY  09/10/2008   normal   CORONARY ANGIOPLASTY WITH STENT PLACEMENT  12-17-2004   dr benismhon/ dr Olevia Perches   nonobstructive cad  LAD and LCFx,  BMS x1 to total occlusion RCA    CORONARY ATHERECTOMY  11/22/2019   CORONARY ATHERECTOMY N/A 11/22/2019   Procedure: CORONARY ATHERECTOMY;  Surgeon: Burnell Blanks, MD;  Location: [place] [facility];  Service: Cardiovascular;  Laterality: N/A;   CYSTOSCOPY W/ URETEROSCOPY W/ LITHOTRIPSY  08/2010   ERCP  03/14/2008   EYE SURGERY Bilateral    cataracts   LAPAROSCOPIC CHOLECYSTECTOMY  05/2012   LEFT HEART CATHETERIZATION WITH CORONARY ANGIOGRAM N/A 05/14/2011   Procedure: LEFT HEART CATHETERIZATION WITH CORONARY ANGIOGRAM;  Surgeon: Sherren Mocha, MD;  Location: [facility] CATH  LAB;  Service: Cardiovascular;  Laterality: N/A;    non-obstructive LM, LAD, LCFx, patent RCA stent   LUMBAR LAMINECTOMY/DECOMPRESSION MICRODISCECTOMY N/A 02/24/2017   Procedure: Microlumbar decompression L4-5, L5-S1;  Surgeon: Susa Day, MD;  Location: [facility] ORS;  Service: Orthopedics;  Laterality: N/A;  120 mins   RIGHT/LEFT HEART CATH AND CORONARY ANGIOGRAPHY N/A 11/14/2019   Procedure: RIGHT/LEFT HEART CATH AND CORONARY ANGIOGRAPHY;  Surgeon: Burnell Blanks, MD;  Location: [place] [facility];  Service: Cardiovascular;  Laterality: N/A;   ROTATOR CUFF REPAIR Right 03/2002   ROTATOR CUFF REPAIR Left 12/11/2015   TEE WITHOUT CARDIOVERSION N/A 12/04/2019   Procedure: TRANSESOPHAGEAL ECHOCARDIOGRAM (TEE);  Surgeon: Burnell Blanks, MD;  Location: [place] [facility];  Service: Open Heart Surgery;  Laterality: N/A;   TOTAL HIP ARTHROPLASTY Right 12/20/2008   TOTAL HIP ARTHROPLASTY Left 07/05/2018   Procedure: TOTAL HIP ARTHROPLASTY ANTERIOR APPROACH;  Surgeon: Gaynelle Arabian, MD;  Location: [facility];  Service: Orthopedics;  Laterality: Left;  173mn   TOTAL KNEE ARTHROPLASTY Bilateral left 12-11-2003/  right 07[facility]31-2004   [facility]  TRANSCATHETER AORTIC VALVE REPLACEMENT, TRANSFEMORAL N/A 12/04/2019   Procedure: TRANSCATHETER AORTIC VALVE REPLACEMENT, TRANSFEMORAL;  Surgeon: MBurnell Blanks MD;  [facility];  Service: Open Heart Surgery;  Laterality: N/A;   UPPER GASTROINTESTINAL ENDOSCOPY  01/08/2008   bx, inlet patch, duodenitis   YAG LASER APPLICATION Right 71/61/0960  Procedure: YAG LASER APPLICATION;  Surgeon: CWilliams Che MD;  Location: [facility];  Service: Ophthalmology;  Laterality: Right;   Family History  Problem Relation Age of Onset   Colon cancer Mother        Diagnosed age 82  Cancer Mother 681  Heart disease Father    Heart attack Father    Breast cancer Sister    Brain cancer Sister    Heart disease Brother    Heart disease Brother    Bladder Cancer Brother    Cancer - Other Brother    Sleep apnea Neg Hx    Social History   Socioeconomic History   Marital status: Married    Spouse name: [name]    Number of children: 1   Years of education: HS   Highest education level: High school graduate  Occupational History   Occupation: RETIRED    Employer: [employer]    Comment: Power plant  Tobacco Use   Smoking status: Former    Packs/day: 1.00    Years: 20.00    Total pack years: 20.00    Types: Cigarettes    Start date: 01/18/1963    Quit date: 08/11/1988    Years since quitting: 33.5   Smokeless tobacco: Former    Types: Chew    Quit date: 01/11/1989   Tobacco comments:    chewed 1 pack tobacco/day for 15 years  Vaping Use   Vaping Use: Never used  Substance and Sexual Activity   Alcohol use: No    Alcohol/week: 0.0 standard drinks of alcohol   Drug use: No   Sexual activity: Yes  Other Topics Concern   Not on file  Social History Narrative   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 between [phone]  Social Determinants  of Health   Financial Resource Strain: Low Risk  (11/28/2020)   Overall Financial Resource Strain (CARDIA)     Difficulty of Paying Living Expenses: Not very hard  Food Insecurity: No Food Insecurity (11/28/2020)   Hunger Vital Sign    Worried About Running Out of Food in the Last Year: Never true    Ran Out of Food in the Last Year: Never true  Transportation Needs: No Transportation Needs (11/28/2020)   PRAPARE - Hydrologist (Medical): No    Lack of Transportation (Non-Medical): No  Physical Activity: Sufficiently Active (11/28/2020)   Exercise Vital Sign    Days of Exercise per Week: 3 days    Minutes of Exercise per Session: 60 min  Stress: No Stress Concern Present (11/28/2020)   [place]    Feeling of Stress : Only a little  Social Connections: Moderately Isolated (11/28/2020)   Social Connection and Isolation Panel [NHANES]    Frequency of Communication with Friends and Family: More than three times a week    Frequency of Social Gatherings with Friends and Family: Once a week    Attends Religious Services: Never    Marine scientist or Organizations: No    Attends Archivist Meetings: Never    Marital Status: Married    Tobacco Counseling Counseling given: Not Answered Tobacco comments: chewed 1 pack tobacco/day for 15 years   Clinical Intake:              How often do you need to have someone help you when you read instructions, pamphlets, or other written materials from your doctor or pharmacy?: (P) 1 - Never  Diabetic?Yes   Nutrition Risk Assessment:  Has the patient had any N/V/D within the last 2 months?  {YES/NO:21197} Does the patient have any non-healing wounds?  {YES/NO:21197} Has the patient had any unintentional weight loss or weight gain?  {YES/NO:21197}  Diabetes:  Is the patient diabetic?  {YES/NO:21197} If diabetic, was a CBG obtained today?  {YES/NO:21197} Did the patient bring in their glucometer from home?  {YES/NO:21197} How often  do you monitor your CBG's? ***.   Financial Strains and Diabetes Management:  Are you having any financial strains with the device, your supplies or your medication? {YES/NO:21197}.  Does the patient want to be seen by Chronic Care Management for management of their diabetes?  {YES/NO:21197} Would the patient like to be referred to a Nutritionist or for Diabetic Management?  {YES/NO:21197}  Diabetic Exams:  Diabetic Eye Exam: Completed 03/31/21 Diabetic Foot Exam: Completed 04/29/21          Activities of Daily Living    02/05/2022    9:43 AM 11/27/2021    9:46 AM  In your present state of health, do you have any difficulty performing the following activities:  Hearing? 1 1  Vision? 0 0  Difficulty concentrating or making decisions? 1 1  Walking or climbing stairs? 1 1  Dressing or bathing? 1 1  Doing errands, shopping? 0 0  Preparing Food and eating ? N N  Using the Toilet? N N  In the past six months, have you accidently leaked urine? Y Y  Do you have problems with loss of bowel control? N N  Managing your Medications? N N  Managing your Finances? N N  Housekeeping or managing your Housekeeping? N N    Patient Care Team: Dettinger, Fransisca Kaufmann, MD as PCP -  General (Family Medicine) Satira Sark, MD as PCP - Cardiology (Cardiology) Burnell Blanks, MD as PCP - Structural Heart (Cardiology) Satira Sark, MD as Consulting Physician (Cardiology) Sydnee Cabal, MD as Consulting Physician (Orthopedic Surgery) Gaynelle Arabian, MD as Consulting Physician (Orthopedic Surgery) Suella Broad, MD as Consulting Physician (Physical Medicine and Rehabilitation) Lavera Guise, [facility] (Pharmacist) Lavonna Monarch, MD (Inactive) as Consulting Physician (Dermatology)  Indicate any recent [place] you may have received from other than [facility] providers in the past year (date may be approximate).     Assessment:   This is a routine wellness examination for  [name].  Hearing/Vision screen No results found.  Dietary issues and exercise activities discussed:     Goals Addressed   None    Depression Screen    02/01/2022    8:34 AM 10/30/2021    8:30 AM 07/29/2021    8:24 AM 04/29/2021    8:21 AM 01/22/2021    9:21 AM 11/28/2020    4:10 PM 10/20/2020    9:10 AM  PHQ 2/9 Scores  PHQ - 2 Score 0 0 1 1 0 0 0  PHQ- 9 Score '2  2 2 '$ 0      Fall Risk    02/05/2022    9:43 AM 02/01/2022    8:34 AM 11/27/2021    9:46 AM 10/30/2021    8:30 AM 07/29/2021    8:24 AM  Fall Risk   Falls in the past year? 0 0 0 0 0    FALL RISK PREVENTION PERTAINING TO THE HOME:  Any stairs in or around the home? {YES/NO:21197} If so, are there any without handrails? {YES/NO:21197} Home free of loose throw rugs in walkways, pet beds, electrical cords, etc? {YES/NO:21197} Adequate lighting in your home to reduce risk of falls? {YES/NO:21197}  ASSISTIVE DEVICES UTILIZED TO PREVENT FALLS:  Life alert? {YES/NO:21197} Use of a cane, walker or w/c? {YES/NO:21197} Grab bars in the bathroom? {YES/NO:21197} Shower chair or bench in shower? {YES/NO:21197} Elevated toilet seat or a handicapped toilet? {YES/NO:21197}  TIMED UP AND GO:  Was the test performed? No . Telephonic visit   Cognitive Function:    05/14/2016    3:57 PM  MMSE - Mini Mental State Exam  Orientation to time 5  Orientation to Place 5  Registration 3  Attention/ Calculation 5  Recall 3  Language- name 2 objects 2  Language- repeat 1  Language- follow 3 step command 3  Language- read & follow direction 1  Write a sentence 1  Copy design 1  Total score 30        11/28/2020    4:08 PM 06/07/2019    9:07 AM 06/02/2018    8:55 AM  6CIT Screen  What Year? 0 points 0 points 0 points  What month? 0 points 0 points 0 points  What time? 0 points 0 points 0 points  Count back from 20 0 points 0 points 0 points  Months in reverse 0 points 0 points 0 points  Repeat phrase 2 points 0  points 4 points  Total Score 2 points 0 points 4 points    Immunizations Immunization History  Administered Date(s) Administered   Fluad Quad(high Dose 65+) 10/20/2020, 10/30/2021   Influenza, High Dose Seasonal PF 10/29/2015, 10/31/2017   Influenza, Quadrivalent, Recombinant, Inj, Pf 10/19/2018   Influenza,inj,Quad PF,6+ Mos 11/08/2013, 10/26/2016   Influenza,inj,quad, With Preservative 10/26/2016   Influenza-Unspecified 11/18/2014, 10/25/2019, 10/25/2019   PFIZER(Purple Top)SARS-COV-2 Vaccination 04/13/2019, 05/07/2019, 11/08/2019,  09/10/2020   Pneumococcal Conjugate-13 01/01/2013, 10/26/2016   Pneumococcal Polysaccharide-23 10/26/2016   Tdap 01/01/2013   Zoster Recombinat (Shingrix) 05/14/2016, 01/27/2017    TDAP status: Up to date  Flu Vaccine status: Up to date  Pneumococcal vaccine status: Up to date  Covid-19 vaccine status: Information provided on how to obtain vaccines.   Qualifies for Shingles Vaccine? Yes   Zostavax completed No   Shingrix Completed?: Yes  Screening Tests Health Maintenance  Topic Date Due   COVID-19 Vaccine (5 - 2023-24 season) 09/11/2021   Medicare Annual Wellness (AWV)  11/28/2021   OPHTHALMOLOGY EXAM  04/01/2022   FOOT EXAM  04/30/2022   Diabetic kidney evaluation - Urine ACR  05/20/2022   HEMOGLOBIN A1C  08/02/2022   Diabetic kidney evaluation - eGFR measurement  10/31/2022   DTaP/Tdap/Td (2 - Td or Tdap) 01/02/2023   Pneumonia Vaccine 57+ Years old  Completed   INFLUENZA VACCINE  Completed   Zoster Vaccines- Shingrix  Completed   HPV VACCINES  Aged Out    Health Maintenance  Health Maintenance Due  Topic Date Due   COVID-19 Vaccine (5 - 2023-24 season) 09/11/2021   Medicare Annual Wellness (AWV)  11/28/2021    Colorectal cancer screening: No longer required.   Lung Cancer Screening: (Low Dose CT Chest recommended if Age 48-80 years, 30 pack-year currently smoking OR have quit w/in 15years.) does not qualify.   Lung Cancer  Screening Referral: n/a   Additional Screening:  Hepatitis C Screening: does not qualify  Vision Screening: Recommended annual ophthalmology exams for early detection of glaucoma and other disorders of the eye. Is the patient up to date with their annual eye exam?  Yes  Who is the provider or what is the name of the office in which the patient attends annual eye exams? Dr. Warden Fillers  If pt is not established with a provider, would they like to be referred to a provider to establish care? No .   Dental Screening: Recommended annual dental exams for proper oral hygiene  Community Resource Referral / Chronic Care Management: CRR required this visit?  No   CCM required this visit?   Patient is currently receiving services      Plan:     I have personally reviewed and noted the following in the patient's chart:   Medical and social history Use of alcohol, tobacco or illicit drugs  Current medications and supplements including opioid prescriptions. {Opioid Prescriptions:[phone]} Functional ability and status Nutritional status Physical activity Advanced directives List of other physicians Hospitalizations, surgeries, and ER visits in previous 12 months Vitals Screenings to include cognitive, depression, and falls Referrals and appointments  In addition, I have reviewed and discussed with patient certain preventive protocols, quality metrics, and best practice recommendations. A written personalized care plan for preventive services as well as general preventive health recommendations were provided to patient.     Vanetta Mulders, [place]   9/93/7169   Due to this being a virtual visit, the after visit summary with patients personalized plan was offered to patient via mail or my-chart. ***Patient declined at this time./ Patient would like to access on my-chart/ per request, patient was mailed a copy of AVS./ Patient preferred to pick up at office at next visit    Nurse Notes: ***

## 2022-02-08 ENCOUNTER — Ambulatory Visit (INDEPENDENT_AMBULATORY_CARE_PROVIDER_SITE_OTHER): Payer: Medicare Other

## 2022-02-08 VITALS — Ht 66.0 in | Wt 226.0 lb

## 2022-02-08 DIAGNOSIS — Z0000 Encounter for general adult medical examination without abnormal findings: Secondary | ICD-10-CM

## 2022-02-08 NOTE — Patient Instructions (Signed)
[name] , Thank you for taking time to come for your Medicare Wellness Visit. I appreciate your ongoing commitment to your health goals. Please review the following plan we discussed and let me know if I can assist you in the future.   These are the goals we discussed:  Goals       Exercise 150 min/wk Moderate Activity      HEMOGLOBIN A1C < 7.0      Cont working on this      T2DM (pt-stated)      Current Barriers:  Unable to independently afford treatment regimen Unable to achieve control of T2DM   Pharmacist Clinical Goal(s):  Over the next 90 days, patient will verbalize ability to afford treatment regimen achieve control of T2DM as evidenced by IMPROVED GLYCEMIC CONTROL through collaboration with PharmD and provider.    Interventions: 1:1 collaboration with Dettinger, Fransisca Kaufmann, MD regarding development and update of comprehensive plan of care as evidenced by provider attestation and co-signature Inter-disciplinary care team collaboration (see longitudinal plan of care) Comprehensive medication review performed; medication list updated in electronic medical record  Diabetes: Uncontrolled (a1c 7.3-->7.7%); current treatment: BASAGLAR 54 UNITS, HUMALOG KWIKPEN 20 UNITS TID MEALS, METFORMIN 2G PER DAY; Ozempic (increase to '2mg'$  weekly) GFR 65-->62 CONTINUE TO WORK ON DIET INCREASE Ozempic to '2mg'$  weekly 4 MONTH SUPPLY ARRIVED TODAY Continue Basaglar to 50-56 units Continue Humalog meal time to 10-20 units Current glucose readings: fasting glucose: <200, post prandial glucose: 180-200s Patient wears dexcom CGM-->ATTEMPTING TO TRANSITION TO [place] Denies hypoglycemic/hyperglycemic symptoms Discussed meal planning options and Plate method for healthy eating Avoid sugary drinks and desserts Incorporate balanced protein, non starchy veggies, 1 serving of carbohydrate with each meal Increase water intake Increase physical activity as able Current exercise: N/A Educated on  DEXCOM, REFILLS CALLED IN discussed dexcom g7--should be available in the next month Assessed patient finances. Re-enrollment approved [facility] FOR BASAGLAR & HUMALOG--FOR 2023; approved for [facility] PAP for ozempic2023- Patient Goals/Self-Care Activities Over the next 90 days, patient will:  - take medications as prescribed check glucose CONTINUOUSLY USING CGM, document, and provide at future appointments collaborate with provider on medication access solutions  Follow Up Plan: Telephone follow up appointment with care management team member scheduled for: 2 months       Weight (lb) < 200 lb (90.7 kg) (pt-stated)      Cont working on this        This is a list of the screening recommended for you and due dates:  Health Maintenance  Topic Date Due   COVID-19 Vaccine (5 - 2023-24 season) 09/11/2021   Eye exam for diabetics  04/01/2022   Complete foot exam   04/30/2022   Yearly kidney health urinalysis for diabetes  05/20/2022   Hemoglobin A1C  08/02/2022   Yearly kidney function blood test for diabetes  10/31/2022   DTaP/Tdap/Td vaccine (2 - Td or Tdap) 01/02/2023   Medicare Annual Wellness Visit  02/09/2023   Pneumonia Vaccine  Completed   Flu Shot  Completed   Zoster (Shingles) Vaccine  Completed   HPV Vaccine  Aged Out    Advanced directives: We have a copy of your advanced directives available in your record should your provider ever need to access them.   Conditions/risks identified: Aim for 30 minutes of exercise or brisk walking, 6-8 glasses of water, and 5 servings of fruits and vegetables each day.   Next appointment: Follow up in one  year for your annual wellness visit.   Preventive Care 16 Years and Older, Male  Preventive care refers to lifestyle choices and visits with your health care provider that can promote health and wellness. What does preventive care include? A yearly physical exam. This is also called an annual well  check. Dental exams once or twice a year. Routine eye exams. Ask your health care provider how often you should have your eyes checked. Personal lifestyle choices, including: Daily care of your teeth and gums. Regular physical activity. Eating a healthy diet. Avoiding tobacco and drug use. Limiting alcohol use. Practicing safe sex. Taking low doses of aspirin every day. Taking vitamin and mineral supplements as recommended by your health care provider. What happens during an annual well check? The services and screenings done by your health care provider during your annual well check will depend on your age, overall health, lifestyle risk factors, and family history of disease. Counseling  Your health care provider may ask you questions about your: Alcohol use. Tobacco use. Drug use. Emotional well-being. Home and relationship well-being. Sexual activity. Eating habits. History of falls. Memory and ability to understand (cognition). Work and work Statistician. Screening  You may have the following tests or measurements: Height, weight, and BMI. Blood pressure. Lipid and cholesterol levels. These may be checked every 5 years, or more frequently if you are over 2 years old. Skin check. Lung cancer screening. You may have this screening every year starting at age 62 if you have a 30-pack-year history of smoking and currently smoke or have quit within the past 15 years. Fecal occult blood test (FOBT) of the stool. You may have this test every year starting at age 60. Flexible sigmoidoscopy or colonoscopy. You may have a sigmoidoscopy every 5 years or a colonoscopy every 10 years starting at age 71. Prostate cancer screening. Recommendations will vary depending on your family history and other risks. Hepatitis C blood test. Hepatitis B blood test. Sexually transmitted disease (STD) testing. Diabetes screening. This is done by checking your blood sugar (glucose) after you have not  eaten for a while (fasting). You may have this done every 1-3 years. Abdominal aortic aneurysm (AAA) screening. You may need this if you are a current or former smoker. Osteoporosis. You may be screened starting at age 47 if you are at high risk. Talk with your health care provider about your test results, treatment options, and if necessary, the need for more tests. Vaccines  Your health care provider may recommend certain vaccines, such as: Influenza vaccine. This is recommended every year. Tetanus, diphtheria, and acellular pertussis (Tdap, Td) vaccine. You may need a Td booster every 10 years. Zoster vaccine. You may need this after age 42. Pneumococcal 13-valent conjugate (PCV13) vaccine. One dose is recommended after age 22. Pneumococcal polysaccharide (PPSV23) vaccine. One dose is recommended after age 13. Talk to your health care provider about which screenings and vaccines you need and how often you need them. This information is not intended to replace advice given to you by your health care provider. Make sure you discuss any questions you have with your health care provider. Document Released: 01/24/2015 Document Revised: 09/17/2015 Document Reviewed: 10/29/2014 Elsevier Interactive Patient Education  2017 [place] Prevention in the Home Falls can cause injuries. They can happen to people of all ages. There are many things you can do to make your home safe and to help prevent falls. What can I do on the outside of my  home? Regularly fix the edges of walkways and driveways and fix any cracks. Remove anything that might make you trip as you walk through a door, such as a raised step or threshold. Trim any bushes or trees on the path to your home. Use bright outdoor lighting. Clear any walking paths of anything that might make someone trip, such as rocks or tools. Regularly check to see if handrails are loose or broken. Make sure that both sides of any steps have  handrails. Any raised decks and porches should have guardrails on the edges. Have any leaves, snow, or ice cleared regularly. Use sand or salt on walking paths during winter. Clean up any spills in your garage right away. This includes oil or grease spills. What can I do in the bathroom? Use night lights. Install grab bars by the toilet and in the tub and shower. Do not use towel bars as grab bars. Use non-skid mats or decals in the tub or shower. If you need to sit down in the shower, use a plastic, non-slip stool. Keep the floor dry. Clean up any water that spills on the floor as soon as it happens. Remove soap buildup in the tub or shower regularly. Attach bath mats securely with double-sided non-slip rug tape. Do not have throw rugs and other things on the floor that can make you trip. What can I do in the bedroom? Use night lights. Make sure that you have a light by your bed that is easy to reach. Do not use any sheets or blankets that are too big for your bed. They should not hang down onto the floor. Have a firm chair that has side arms. You can use this for support while you get dressed. Do not have throw rugs and other things on the floor that can make you trip. What can I do in the kitchen? Clean up any spills right away. Avoid walking on wet floors. Keep items that you use a lot in easy-to-reach places. If you need to reach something above you, use a strong step stool that has a grab bar. Keep electrical cords out of the way. Do not use floor polish or wax that makes floors slippery. If you must use wax, use non-skid floor wax. Do not have throw rugs and other things on the floor that can make you trip. What can I do with my stairs? Do not leave any items on the stairs. Make sure that there are handrails on both sides of the stairs and use them. Fix handrails that are broken or loose. Make sure that handrails are as long as the stairways. Check any carpeting to make sure  that it is firmly attached to the stairs. Fix any carpet that is loose or worn. Avoid having throw rugs at the top or bottom of the stairs. If you do have throw rugs, attach them to the floor with carpet tape. Make sure that you have a light switch at the top of the stairs and the bottom of the stairs. If you do not have them, ask someone to add them for you. What else can I do to help prevent falls? Wear shoes that: Do not have high heels. Have rubber bottoms. Are comfortable and fit you well. Are closed at the toe. Do not wear sandals. If you use a stepladder: Make sure that it is fully opened. Do not climb a closed stepladder. Make sure that both sides of the stepladder are locked into place. Ask someone  to hold it for you, if possible. Clearly mark and make sure that you can see: Any grab bars or handrails. First and last steps. Where the edge of each step is. Use tools that help you move around (mobility aids) if they are needed. These include: Canes. Walkers. Scooters. Crutches. Turn on the lights when you go into a dark area. Replace any light bulbs as soon as they burn out. Set up your furniture so you have a clear path. Avoid moving your furniture around. If any of your floors are uneven, fix them. If there are any pets around you, be aware of where they are. Review your medicines with your doctor. Some medicines can make you feel dizzy. This can increase your chance of falling. Ask your doctor what other things that you can do to help prevent falls. This information is not intended to replace advice given to you by your health care provider. Make sure you discuss any questions you have with your health care provider. Document Released: 10/24/2008 Document Revised: 06/05/2015 Document Reviewed: 02/01/2014 Elsevier Interactive Patient Education  2017 [employer].

## 2022-02-19 ENCOUNTER — Other Ambulatory Visit (HOSPITAL_COMMUNITY): Payer: Self-pay

## 2022-02-19 ENCOUNTER — Telehealth: Payer: Self-pay

## 2022-02-19 NOTE — Telephone Encounter (Signed)
Submitted application for OZEMPIC to [place] for patient assistance.   Submitted application for [place] to [place] for patient assistance.

## 2022-02-25 NOTE — Telephone Encounter (Signed)
Refaxed pg 8 of [employer]

## 2022-03-01 NOTE — Telephone Encounter (Signed)
Received notification from [place] [facility] phone call follow up regarding approval for [place]. Patient assistance approved from 02/26/22 to 01/11/23.  MEDICATION WILL SHIP TO PT'S HOME.  Shipments are in process, Rep updated shipping status to where both should go out by end of week   Phone: [phone]

## 2022-03-03 ENCOUNTER — Telehealth: Payer: Self-pay | Admitting: Family Medicine

## 2022-03-03 ENCOUNTER — Other Ambulatory Visit (HOSPITAL_COMMUNITY): Payer: Self-pay

## 2022-03-03 NOTE — Telephone Encounter (Signed)
Informed wife that shipment should process and deliver within the next few days.  Wife informed me that they rec'd letter from [employer] [facility] regarding approval for patients Ozempic   Received notification from [place] regarding approval for [place]. Patient assistance approved until 01/11/23.  Phone: [phone]

## 2022-03-05 ENCOUNTER — Telehealth: Payer: Self-pay | Admitting: Pharmacist

## 2022-03-05 DIAGNOSIS — Z794 Long term (current) use of insulin: Secondary | ICD-10-CM

## 2022-03-05 MED ORDER — INSULIN LISPRO (1 UNIT DIAL) 100 UNIT/ML (KWIKPEN): 20.0000 [IU] | PEN_INJECTOR | Freq: Three times a day (TID) | SUBCUTANEOUS | 11 refills | Status: DC

## 2022-03-05 MED ORDER — BASAGLAR KWIKPEN 100 UNIT/ML ~~LOC~~ SOPN: 56.0000 [IU] | PEN_INJECTOR | Freq: Every day | SUBCUTANEOUS | 5 refills | Status: DC

## 2022-03-05 NOTE — Telephone Encounter (Signed)
    03/05/2022 Name: [name] MRN: [id number] DOB: [birth year]   New insulin refills for basaglar and humalog sent to [facility] order pharmacy [facility]  Ozempic 90m weekly arrived for pick up PCP office  Patient stable and doing well  Will f/u in 3 months   JRegina Eck PharmD, BSodus PointPharmacist, [place]

## 2022-03-10 ENCOUNTER — Other Ambulatory Visit: Payer: Self-pay | Admitting: Cardiology

## 2022-03-13 DIAGNOSIS — E1165 Type 2 diabetes mellitus with hyperglycemia: Secondary | ICD-10-CM | POA: Diagnosis not present

## 2022-03-13 DIAGNOSIS — Z794 Long term (current) use of insulin: Secondary | ICD-10-CM | POA: Diagnosis not present

## 2022-03-29 ENCOUNTER — Encounter: Payer: Self-pay | Admitting: Dermatology

## 2022-03-29 ENCOUNTER — Ambulatory Visit: Payer: Medicare Other | Admitting: Dermatology

## 2022-03-29 VITALS — BP 133/73 | HR 77

## 2022-03-29 DIAGNOSIS — L814 Other melanin hyperpigmentation: Secondary | ICD-10-CM | POA: Diagnosis not present

## 2022-03-29 DIAGNOSIS — L720 Epidermal cyst: Secondary | ICD-10-CM

## 2022-03-29 DIAGNOSIS — B079 Viral wart, unspecified: Secondary | ICD-10-CM | POA: Diagnosis not present

## 2022-03-29 DIAGNOSIS — Z1283 Encounter for screening for malignant neoplasm of skin: Secondary | ICD-10-CM | POA: Diagnosis not present

## 2022-03-29 DIAGNOSIS — L570 Actinic keratosis: Secondary | ICD-10-CM | POA: Diagnosis not present

## 2022-03-29 DIAGNOSIS — D1801 Hemangioma of skin and subcutaneous tissue: Secondary | ICD-10-CM

## 2022-03-29 DIAGNOSIS — L578 Other skin changes due to chronic exposure to nonionizing radiation: Secondary | ICD-10-CM | POA: Diagnosis not present

## 2022-03-29 DIAGNOSIS — L853 Xerosis cutis: Secondary | ICD-10-CM | POA: Diagnosis not present

## 2022-03-29 DIAGNOSIS — D229 Melanocytic nevi, unspecified: Secondary | ICD-10-CM | POA: Diagnosis not present

## 2022-03-29 DIAGNOSIS — L821 Other seborrheic keratosis: Secondary | ICD-10-CM

## 2022-03-29 DIAGNOSIS — L918 Other hypertrophic disorders of the skin: Secondary | ICD-10-CM

## 2022-03-29 DIAGNOSIS — R202 Paresthesia of skin: Secondary | ICD-10-CM | POA: Diagnosis not present

## 2022-03-29 DIAGNOSIS — L219 Seborrheic dermatitis, unspecified: Secondary | ICD-10-CM

## 2022-03-29 DIAGNOSIS — D692 Other nonthrombocytopenic purpura: Secondary | ICD-10-CM | POA: Diagnosis not present

## 2022-03-29 MED ORDER — HYDROCORTISONE 2.5 % EX CREA: TOPICAL_CREAM | CUTANEOUS | 3 refills | Status: DC

## 2022-03-29 NOTE — Progress Notes (Signed)
New Patient Visit  Subjective  [name] is a 82 y.o. male who presents for the following: Total body skin exam (No hx of skin cancer, hx of AK). persistent itchy area back of neck. Also itchy on forehead and ears.  The patient presents for Total-Body Skin Exam (TBSE) for skin cancer screening and mole check.  The patient has spots, moles and lesions to be evaluated, some may be new or changing and the patient has concerns that these could be cancer.   The following portions of the chart were reviewed this encounter and updated as appropriate:       Review of Systems:  No other skin or systemic complaints except as noted in HPI or Assessment and Plan.  Objective  Well appearing patient in no apparent distress; mood and affect are within normal limits.  A full examination was performed including scalp, head, eyes, ears, nose, lips, neck, chest, axillae, abdomen, back, buttocks, bilateral upper extremities, bilateral lower extremities, hands, feet, fingers, toes, fingernails, and toenails. All findings within normal limits unless otherwise noted below.  Neck - Posterior Excoriations L spinal post lower neck, pt says it itches, no obvious rash present  R lower neck x 2 Firm white papules  L temple x 1 Pink scaly macule  forehead Mild scale forehead, ears  L upper knee x 1 Verrucous papule    Assessment & Plan   Lentigines - Scattered tan macules - Due to sun exposure - Benign-appearing, observe - Recommend daily broad spectrum sunscreen SPF 30+ to sun-exposed areas, reapply every 2 hours as needed. - Call for any changes - back  Seborrheic Keratoses - Stuck-on, waxy, tan-brown papules and/or plaques  - Benign-appearing - Discussed benign etiology and prognosis. - Observe - Call for any changes - back, left arm  Melanocytic Nevi - Tan-brown and/or pink-flesh-colored symmetric macules and papules - Benign appearing on exam today - Observation - Call  clinic for new or changing moles - Recommend daily use of broad spectrum spf 30+ sunscreen to sun-exposed areas.   Hemangiomas - Red papules - Discussed benign nature - Observe - Call for any changes - trunk  Acrochordons (Skin Tags) - Fleshy, skin-colored pedunculated papules - Benign appearing.  - Observe. - If desired, they can be removed with an in office procedure that is not covered by insurance. - Please call the clinic if you notice any new or changing lesions.  - axilla  Actinic Damage - Chronic condition, secondary to cumulative UV/sun exposure - diffuse scaly erythematous macules with underlying dyspigmentation - Recommend daily broad spectrum sunscreen SPF 30+ to sun-exposed areas, reapply every 2 hours as needed.  - Staying in the shade or wearing long sleeves, sun glasses (UVA+UVB protection) and wide brim hats (4-inch brim around the entire circumference of the hat) are also recommended for sun protection.  - Call for new or changing lesions.  Skin cancer screening performed today.   Purpura - Chronic; persistent and recurrent.  Treatable, but not curable. 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 Condition is symptomatic/ bothersome to patient. Not currently at goal.   Notalgia paresthetica is a chronic condition affecting the skin of the back in which a pinched nerve along the spine causes itching or changes in sensation in an area of skin.  This is usually accompanied by chronic rubbing or scratching often leaving the area of skin discolored and thickened. There is no cure, but there are some treatments which may help control the itch.   Over the counter (non-prescription) treatments for notalgia paresthetica include numbing creams like pramoxine or lidocaine which temporarily reduce itch or Capsaicin-containing creams which cause a burning sensation but which sometimes over time will reset the nerves to stop producing itch.  If you choose to use Capsaicin cream, it is recommended to use it 5 times daily for 1 week followed by 3 times daily for 3-6 weeks. You may have to continue using it long-term. - If not doing well with OTC options, could consider [employer] compounded prescription anti-itch cream with Amitriptyline 5% / Lidocaine 5% / Pramoxine 1% or Amitriptyline 5% / Gabapentin 10% / Lidocaine 5% Cream. For severe cases, there are some prescription cream or pill options which may help. Other treatment options include: - Transcutaneous Electrical Nerve Stimulation (TENS) - Gabapentin 300-900 mg daily po - Amitriptyline orally - Paravertebral local anesthetic block - intralesional Botulinum toxin A  May start HC 2.5% cr qd prn itching  Topical steroids (such as triamcinolone, fluocinolone, fluocinonide, mometasone, clobetasol, halobetasol, betamethasone, hydrocortisone) can cause thinning and lightening of the skin if they are used for too long in the same area. Your physician has selected the right strength medicine for your problem and area affected on the body. Please use your medication only as directed by your physician to prevent side effects.    Epidermal cyst R lower neck x 2  Benign-appearing. Exam most consistent with an epidermal inclusion cyst. Discussed that a cyst is a benign growth that can grow over time and sometimes get irritated or inflamed. Recommend observation if it is not bothersome. Discussed option  of surgical excision to remove it if it is growing, symptomatic, or other changes noted. Please call for new or changing lesions so they can be evaluated.    AK (actinic keratosis) L temple x 1  Destruction of lesion - L temple x 1  Destruction method: cryotherapy   Informed consent: discussed and consent obtained   Lesion destroyed using liquid nitrogen: Yes   Region frozen until ice ball extended beyond lesion: Yes   Outcome: patient tolerated procedure well with no complications   Post-procedure details: wound care instructions given   Additional details:  Prior to procedure, discussed risks of blister formation, small wound, skin dyspigmentation, or rare scar following cryotherapy. Recommend Vaseline ointment to treated areas while healing.   Seborrheic dermatitis forehead  Chronic and persistent condition with duration or expected duration over one year. Condition is symptomatic/ bothersome to patient. Not currently at goal.   Seborrheic Dermatitis  -  is a chronic persistent rash characterized by pinkness and scaling most commonly of the mid face but also can occur on the scalp (dandruff), ears; mid chest, mid back and groin.  It tends to be exacerbated by stress and cooler weather.  People who have neurologic disease may experience new onset or exacerbation of existing seborrheic dermatitis.  The condition is not curable but treatable and can be controlled.  Start  [facility]  2.5% cr qd/bid aa forehead prn itchy rash  hydrocortisone 2.5 % cream - forehead Apply topically as directed. Qd aa forehead and posterior neck prn itching  Viral warts, unspecified type L upper knee x 1  Vs ISK  Viral Wart (HPV) Counseling  Discussed viral / HPV (Human Papilloma Virus) etiology and risk of spread /infectivity to other areas of body as well as to other people.  Multiple treatments and methods may be required to clear warts and it is possible treatment may not be successful.  Treatment risks  include discoloration; scarring and there is still potential for wart recurrence.  Destruction of lesion - L upper knee x 1  Destruction method: cryotherapy   Informed consent: discussed and consent obtained   Lesion destroyed using liquid nitrogen: Yes   Region frozen until ice ball extended beyond lesion: Yes   Outcome: patient tolerated procedure well with no complications   Post-procedure details: wound care instructions given   Additional details:  Prior to procedure, discussed risks of blister formation, small wound, skin dyspigmentation, or rare scar following cryotherapy. Recommend Vaseline ointment to treated areas while healing.    Return in about 1 year (around 03/29/2023) for TBSE, Hx of AKs.  I, Othelia Pulling, RMA, am acting as scribe for Brendolyn Patty, MD .  Documentation: I have reviewed the above documentation for accuracy and completeness, and I agree with the above.  Brendolyn Patty MD

## 2022-03-29 NOTE — Patient Instructions (Addendum)
Cryotherapy Aftercare  Wash gently with soap and water everyday.   Apply Vaseline and Band-Aid daily until healed.     Due to recent changes in healthcare laws, you may see results of your pathology and/or laboratory studies on MyChart before the doctors have had a chance to review them. We understand that in some cases there may be results that are confusing or concerning to you. Please understand that not all results are received at the same time and often the doctors may need to interpret multiple results in order to provide you with the best plan of care or course of treatment. Therefore, we ask that you please give us 2 business days to thoroughly review all your results before contacting the office for clarification. Should we see a critical lab result, you will be contacted sooner.   If You Need Anything After Your Visit  If you have any questions or concerns for your doctor, please call our main line at [phone] and press option 4 to reach your doctor's medical assistant. If no one answers, please leave a voicemail as directed and we will return your call as soon as possible. Messages left after 4 pm will be answered the following business day.   You may also send us a message via MyChart. We typically respond to MyChart messages within 1-2 business days.  For prescription refills, please ask your pharmacy to contact our office. Our fax number is [phone].  If you have an urgent issue when the clinic is closed that cannot wait until the next business day, you can page your doctor at the number below.    Please note that while we do our best to be available for urgent issues outside of office hours, we are not available 24/7.   If you have an urgent issue and are unable to reach us, you may choose to seek medical care at your doctor's office, retail clinic, urgent care center, or emergency room.  If you have a medical emergency, please immediately call 911 or go to the  emergency department.  Pager Numbers  - Dr. Kowalski: [phone]  - Dr. Moye: [phone]  - Dr. Stewart: [phone]  In the event of inclement weather, please call our main line at [phone] for an update on the status of any delays or closures.  Dermatology Medication Tips: Please keep the boxes that topical medications come in in order to help keep track of the instructions about where and how to use these. Pharmacies typically print the medication instructions only on the boxes and not directly on the medication tubes.   If your medication is too expensive, please contact our office at [phone] option 4 or send us a message through MyChart.   We are unable to tell what your co-pay for medications will be in advance as this is different depending on your insurance coverage. However, we may be able to find a substitute medication at lower cost or fill out paperwork to get insurance to cover a needed medication.   If a prior authorization is required to get your medication covered by your insurance company, please allow us 1-2 business days to complete this process.  Drug prices often vary depending on where the prescription is filled and some pharmacies may offer cheaper prices.  The website www.goodrx.com contains coupons for medications through different pharmacies. The prices here do not account for what the cost may be with help from insurance (it may be cheaper with your insurance), but the website can   give you the price if you did not use any insurance.  - You can print the associated coupon and take it with your prescription to the pharmacy.  - You may also stop by our office during regular business hours and pick up a GoodRx coupon card.  - If you need your prescription sent electronically to a different pharmacy, notify our office through [facility] MyChart or by phone at [phone] option 4.     Si Usted Necesita Algo Despus de Su Visita  Tambin puede  enviarnos un mensaje a travs de MyChart. Por lo general respondemos a los mensajes de MyChart en el transcurso de 1 a 2 das hbiles.  Para renovar recetas, por favor pida a su farmacia que se ponga en contacto con nuestra oficina. Nuestro nmero de fax es el [phone].  Si tiene un asunto urgente cuando la clnica est cerrada y que no puede esperar hasta el siguiente da hbil, puede llamar/localizar a su doctor(a) al nmero que aparece a continuacin.   Por favor, tenga en cuenta que aunque hacemos todo lo posible para estar disponibles para asuntos urgentes fuera del horario de oficina, no estamos disponibles las 24 horas del da, los 7 das de la semana.   Si tiene un problema urgente y no puede comunicarse con nosotros, puede optar por buscar atencin mdica  en el consultorio de su doctor(a), en una clnica privada, en un centro de atencin urgente o en una sala de emergencias.  Si tiene una emergencia mdica, por favor llame inmediatamente al 911 o vaya a la sala de emergencias.  Nmeros de bper  - Dr. Kowalski: [phone]  - Dra. Moye: [phone]  - Dra. Stewart: [phone]  En caso de inclemencias del tiempo, por favor llame a nuestra lnea principal al [phone] para una actualizacin sobre el estado de cualquier retraso o cierre.  Consejos para la medicacin en dermatologa: Por favor, guarde las cajas en las que vienen los medicamentos de uso tpico para ayudarle a seguir las instrucciones sobre dnde y cmo usarlos. Las farmacias generalmente imprimen las instrucciones del medicamento slo en las cajas y no directamente en los tubos del medicamento.   Si su medicamento es muy caro, por favor, pngase en contacto con nuestra oficina llamando al [phone] y presione la opcin 4 o envenos un mensaje a travs de MyChart.   No podemos decirle cul ser su copago por los medicamentos por adelantado ya que esto es diferente dependiendo de la cobertura de su seguro.  Sin embargo, es posible que podamos encontrar un medicamento sustituto a menor costo o llenar un formulario para que el seguro cubra el medicamento que se considera necesario.   Si se requiere una autorizacin previa para que su compaa de seguros cubra su medicamento, por favor permtanos de 1 a 2 das hbiles para completar este proceso.  Los precios de los medicamentos varan con frecuencia dependiendo del lugar de dnde se surte la receta y alguna farmacias pueden ofrecer precios ms baratos.  El sitio web www.goodrx.com tiene cupones para medicamentos de diferentes farmacias. Los precios aqu no tienen en cuenta lo que podra costar con la ayuda del seguro (puede ser ms barato con su seguro), pero el sitio web puede darle el precio si no utiliz ningn seguro.  - Puede imprimir el cupn correspondiente y llevarlo con su receta a la farmacia.  - Tambin puede pasar por nuestra oficina durante el horario de atencin regular y recoger una tarjeta de cupones de GoodRx.  -   Si necesita que su receta se enve electrnicamente a una farmacia diferente, informe a nuestra oficina a travs de MyChart de [facility] o por telfono llamando al [phone] y presione la opcin 4.  

## 2022-04-07 DIAGNOSIS — H43813 Vitreous degeneration, bilateral: Secondary | ICD-10-CM | POA: Diagnosis not present

## 2022-04-07 DIAGNOSIS — E119 Type 2 diabetes mellitus without complications: Secondary | ICD-10-CM | POA: Diagnosis not present

## 2022-04-07 DIAGNOSIS — H02423 Myogenic ptosis of bilateral eyelids: Secondary | ICD-10-CM | POA: Diagnosis not present

## 2022-04-07 DIAGNOSIS — E113292 Type 2 diabetes mellitus with mild nonproliferative diabetic retinopathy without macular edema, left eye: Secondary | ICD-10-CM | POA: Diagnosis not present

## 2022-04-07 DIAGNOSIS — H02834 Dermatochalasis of left upper eyelid: Secondary | ICD-10-CM | POA: Diagnosis not present

## 2022-04-07 DIAGNOSIS — H04123 Dry eye syndrome of bilateral lacrimal glands: Secondary | ICD-10-CM | POA: Diagnosis not present

## 2022-04-07 DIAGNOSIS — H02831 Dermatochalasis of right upper eyelid: Secondary | ICD-10-CM | POA: Diagnosis not present

## 2022-04-07 LAB — HM DIABETES EYE EXAM

## 2022-04-22 DIAGNOSIS — G4733 Obstructive sleep apnea (adult) (pediatric): Secondary | ICD-10-CM | POA: Diagnosis not present

## 2022-05-06 ENCOUNTER — Ambulatory Visit (INDEPENDENT_AMBULATORY_CARE_PROVIDER_SITE_OTHER): Payer: Medicare Other | Admitting: Family Medicine

## 2022-05-06 ENCOUNTER — Encounter: Payer: Self-pay | Admitting: Family Medicine

## 2022-05-06 VITALS — BP 134/69 | HR 88 | Ht 66.0 in | Wt 209.0 lb

## 2022-05-06 DIAGNOSIS — I152 Hypertension secondary to endocrine disorders: Secondary | ICD-10-CM | POA: Diagnosis not present

## 2022-05-06 DIAGNOSIS — E1169 Type 2 diabetes mellitus with other specified complication: Secondary | ICD-10-CM | POA: Diagnosis not present

## 2022-05-06 DIAGNOSIS — F0394 Unspecified dementia, unspecified severity, with anxiety: Secondary | ICD-10-CM | POA: Diagnosis not present

## 2022-05-06 DIAGNOSIS — E785 Hyperlipidemia, unspecified: Secondary | ICD-10-CM | POA: Diagnosis not present

## 2022-05-06 DIAGNOSIS — E1159 Type 2 diabetes mellitus with other circulatory complications: Secondary | ICD-10-CM

## 2022-05-06 DIAGNOSIS — Z794 Long term (current) use of insulin: Secondary | ICD-10-CM | POA: Diagnosis not present

## 2022-05-06 DIAGNOSIS — F0393 Unspecified dementia, unspecified severity, with mood disturbance: Secondary | ICD-10-CM

## 2022-05-06 DIAGNOSIS — G301 Alzheimer's disease with late onset: Secondary | ICD-10-CM

## 2022-05-06 LAB — CMP14+EGFR
ALT: 22 IU/L (ref 0–44)
AST: 22 IU/L (ref 0–40)
Albumin/Globulin Ratio: 1.4 (ref 1.2–2.2)
Albumin: 3.9 g/dL (ref 3.7–4.7)
Alkaline Phosphatase: 72 IU/L (ref 44–121)
BUN/Creatinine Ratio: 19 (ref 10–24)
BUN: 21 mg/dL (ref 8–27)
Bilirubin Total: 0.4 mg/dL (ref 0.0–1.2)
CO2: 19 mmol/L — ABNORMAL LOW (ref 20–29)
Calcium: 9.3 mg/dL (ref 8.6–10.2)
Chloride: 98 mmol/L (ref 96–106)
Creatinine, Ser: 1.12 mg/dL (ref 0.76–1.27)
Globulin, Total: 2.7 g/dL (ref 1.5–4.5)
Glucose: 201 mg/dL — ABNORMAL HIGH (ref 70–99)
Potassium: 3.8 mmol/L (ref 3.5–5.2)
Sodium: 138 mmol/L (ref 134–144)
Total Protein: 6.6 g/dL (ref 6.0–8.5)
eGFR: 66 mL/min/{1.73_m2} (ref >59)

## 2022-05-06 LAB — LIPID PANEL
Chol/HDL Ratio: 3.3 ratio (ref 0.0–5.0)
Cholesterol, Total: 117 mg/dL (ref 100–199)
HDL: 36 mg/dL — ABNORMAL LOW (ref >39)
LDL Chol Calc (NIH): 51 mg/dL (ref 0–99)
Triglycerides: 179 mg/dL — ABNORMAL HIGH (ref 0–149)
VLDL Cholesterol Cal: 30 mg/dL (ref 5–40)

## 2022-05-06 LAB — CBC WITH DIFFERENTIAL/PLATELET
Basophils Absolute: 0.1 10*3/uL (ref 0.0–0.2)
Basos: 1 %
EOS (ABSOLUTE): 0.2 10*3/uL (ref 0.0–0.4)
Eos: 2 %
Hematocrit: 39.9 % (ref 37.5–51.0)
Hemoglobin: 13.4 g/dL (ref 13.0–17.7)
Immature Grans (Abs): 0 10*3/uL (ref 0.0–0.1)
Immature Granulocytes: 0 %
Lymphocytes Absolute: 1.9 10*3/uL (ref 0.7–3.1)
Lymphs: 25 %
MCH: 29 pg (ref 26.6–33.0)
MCHC: 33.6 g/dL (ref 31.5–35.7)
MCV: 86 fL (ref 79–97)
Monocytes Absolute: 0.5 10*3/uL (ref 0.1–0.9)
Monocytes: 7 %
Neutrophils Absolute: 4.7 10*3/uL (ref 1.4–7.0)
Neutrophils: 65 %
Platelets: 292 10*3/uL (ref 150–450)
RBC: 4.62 x10E6/uL (ref 4.14–5.80)
RDW: 14.2 % (ref 11.6–15.4)
WBC: 7.3 10*3/uL (ref 3.4–10.8)

## 2022-05-06 LAB — BAYER DCA HB A1C WAIVED: HB A1C (BAYER DCA - WAIVED): 7.4 % — ABNORMAL HIGH (ref 4.8–5.6)

## 2022-05-06 MED ORDER — BUTALBITAL-APAP-CAFFEINE 50-325-40 MG PO TABS: 1.0000 | ORAL_TABLET | Freq: Two times a day (BID) | ORAL | 0 refills | Status: DC | PRN

## 2022-05-06 NOTE — Progress Notes (Signed)
BP 134/69   Pulse 88   Ht 5\' 6"  (1.676 m)   Wt 209 lb (94.8 kg)   SpO2 96%   BMI 33.73 kg/m    Subjective:   Patient ID: [name], male    DOB: 1940-08-06, 82 y.o.   MRN: [id number]  HPI: [name] is a 82 y.o. male presenting on 05/06/2022 for Medical Management of Chronic Issues, Diabetes, Hypertension, and Hyperlipidemia   HPI Type 2 diabetes mellitus Patient comes in today for recheck of his diabetes. Patient has been currently taking Basaglar and Humalog. Patient is not currently on an ACE inhibitor/ARB. Patient has not seen an ophthalmologist this year. Patient denies any new issues with their feet. The symptom started onset as an adult hypertension and hyperlipidemia and CAD ARE RELATED TO DM   Hypertension Patient is currently on amlodipine and chlorthalidone and furosemide and Imdur, and their blood pressure today is 134/69. Patient denies any lightheadedness or dizziness. Patient denies headaches, blurred vision, chest pains, shortness of breath, or weakness. Denies any side effects from medication and is content with current medication.   Hyperlipidemia Patient is coming in for recheck of his hyperlipidemia. The patient is currently taking atorvastatin and fish oils. They deny any issues with myalgias or history of liver damage from it. They deny any focal numbness or weakness or chest pain.   Dementia with behavioral disturbance Patient is coming in for dementia with anxiety and mood disturbance.  He takes Paxil for this.  He also takes Aricept for the dementia.  He feels like mood is doing okay, he currently takes the paroxetine and does well with it.    05/06/2022    8:36 AM 05/06/2022    8:34 AM 02/08/2022    1:06 PM 02/01/2022    8:34 AM 10/30/2021    8:31 AM  Depression screen PHQ 2/9  Decreased Interest 0 0 0 0   Down, Depressed, Hopeless 0 0 0 0   PHQ - 2 Score 0 0 0 0   Altered sleeping 0   0 0  Tired, decreased energy 1   1 1   Change in  appetite 1   1 1   Feeling bad or failure about yourself  0   0 0  Trouble concentrating 0   0 0  Moving slowly or fidgety/restless 0   0 0  Suicidal thoughts 0   0 0  PHQ-9 Score 2   2   Difficult doing work/chores Not difficult at all   Not difficult at all Not difficult at all     Relevant past medical, surgical, family and social history reviewed and updated as indicated. Interim medical history since our last visit reviewed. Allergies and medications reviewed and updated.  Review of Systems  Constitutional:  Negative for chills and fever.  Eyes:  Negative for visual disturbance.  Respiratory:  Negative for shortness of breath and wheezing.   Cardiovascular:  Negative for chest pain and leg swelling.  Musculoskeletal:  Negative for back pain and gait problem.  Skin:  Negative for rash.  Neurological:  Negative for dizziness, weakness and light-headedness.  All other systems reviewed and are negative.   Per HPI unless specifically indicated above   Allergies as of 05/06/2022       Reactions   Ace Inhibitors Hives, Swelling, Rash   Rash,hives,tongue swelling   Angiotensin Receptor Blockers    Unknown reaction    Oxycodone-acetaminophen Other (See Comments)   Unknown reaction  Robaxin [methocarbamol] Other (See Comments)   Confusion    Toradol [ketorolac Tromethamine] Other (See Comments)   confusion   Valium Other (See Comments)   Hallucinations; confusion   Doxycycline Hives, Rash        Medication List        Accurate as of May 06, 2022  9:01 AM. If you have any questions, ask your nurse or doctor.          acetaminophen 650 MG CR tablet Commonly known as: TYLENOL Take 1,300 mg by mouth 2 (two) times daily.   amLODipine 5 MG tablet Commonly known as: NORVASC TAKE 1 TABLET BY MOUTH DAILY   aspirin EC 81 MG tablet Take 81 mg by mouth at bedtime.   atorvastatin 40 MG tablet Commonly known as: LIPITOR TAKE 1 TABLET BY MOUTH IN THE  EVENING    Basaglar KwikPen 100 UNIT/ML Inject 56 Units into the skin at bedtime.   butalbital-acetaminophen-caffeine 50-325-40 MG tablet Commonly known as: FIORICET Take 1 tablet by mouth 2 (two) times daily as needed for headache.   cetirizine 10 MG tablet Commonly known as: ZYRTEC Take 10 mg by mouth daily with supper.   chlorthalidone 25 MG tablet Commonly known as: HYGROTON TAKE 1 TABLET BY MOUTH DAILY   cyanocobalamin 1000 MCG tablet Commonly known as: VITAMIN B12 Take 2,000 mcg by mouth daily.   Dexcom G7 Receiver Devi Use to test blood sugar continuously.  DX: E11.65 patient to use good rx card; receiver only   Dexcom G7 Sensor Misc by Does not apply route. APPLY SENSOR TO ARM EVERY 10 DAYS. SENT TO [facility]   docusate sodium 100 MG capsule Commonly known as: COLACE Take 100 mg by mouth daily as needed for mild constipation or moderate constipation.   donepezil 5 MG tablet Commonly known as: ARICEPT Take 1 tablet (5 mg total) by mouth at bedtime.   famotidine 20 MG tablet Commonly known as: PEPCID Take 1 tablet (20 mg total) by mouth 2 (two) times daily with a meal.   Fish Oil 1200 MG Caps Take 1,200-2,400 mg by mouth See admin instructions. Tale 1200 mg in the morning and 2400 mg in the evening   furosemide 20 MG tablet Commonly known as: LASIX TAKE 1 TABLET BY MOUTH  DAILY AS NEEDED   hydrocortisone 2.5 % cream Apply topically as directed. Qd aa forehead and posterior neck prn itching   insulin lispro 100 UNIT/ML KwikPen Commonly known as: HumaLOG KwikPen Inject 20 Units into the skin 3 (three) times daily with meals.   isosorbide mononitrate 30 MG 24 hr tablet Commonly known as: IMDUR TAKE 1 TABLET BY MOUTH IN THE  EVENING   metFORMIN 500 MG 24 hr tablet Commonly known as: GLUCOPHAGE-XR Take 2 tablets (1,000 mg total) by mouth 2 (two) times daily.   nitroGLYCERIN 0.4 MG SL tablet Commonly known as: NITROSTAT DISSOLVE ONE TABLET UNDER  THE TONGUE EVERY 5 MINUTES AS NEEDED FOR CHEST PAIN.  DO NOT EXCEED A TOTAL OF 3 DOSES IN 15 MINUTES   OneTouch Ultra test strip Generic drug: glucose blood Use to test blood sugar 4 times daily as directed. Dx E11.59   PARoxetine 20 MG tablet Commonly known as: PAXIL Take 1 tablet (20 mg total) by mouth daily.   Pfizer-BioNT COVID-19 Vac-TriS Susp injection Generic drug: COVID-19 mRNA Vac-TriS (Pfizer)   Semaglutide (1 MG/DOSE) 4 MG/3ML Sopn Inject 1 mg as directed once a week. What changed: how much to take  tamsulosin 0.4 MG Caps capsule Commonly known as: FLOMAX Take 1 capsule (0.4 mg total) by mouth daily.   Turmeric Curcumin 500 MG Caps Take 500 mg by mouth daily.   Vitamin D3 50 MCG (2000 UT) capsule Take 4,000 Units by mouth daily.         Objective:   BP 134/69   Pulse 88   Ht  (1.676 m)   Wt 209 lb (94.8 kg)   SpO2 96%   BMI 33.73 kg/m   Wt Readings from Last 3 Encounters:  05/06/22 209 lb (94.8 kg)  02/08/22 226 lb (102.5 kg)  02/01/22 226 lb (102.5 kg)    Physical Exam Vitals and nursing note reviewed.  Constitutional:      General: He is not in acute distress.    Appearance: He is well-developed. He is not diaphoretic.  Eyes:     General: No scleral icterus.    Conjunctiva/sclera: Conjunctivae normal.  Neck:     Thyroid: No thyromegaly.  Cardiovascular:     Rate and Rhythm: Normal rate and regular rhythm.     Heart sounds: Normal heart sounds. No murmur heard. Pulmonary:     Effort: Pulmonary effort is normal. No respiratory distress.     Breath sounds: Normal breath sounds. No wheezing.  Musculoskeletal:        General: No swelling. Normal range of motion.     Cervical back: Neck supple.  Lymphadenopathy:     Cervical: No cervical adenopathy.  Skin:    General: Skin is warm and dry.     Findings: No rash.  Neurological:     Mental Status: He is alert and oriented to person, place, and time.     Coordination: Coordination  normal.  Psychiatric:        Behavior: Behavior normal.     Results for orders placed or performed in visit on 04/19/22  HM DIABETES EYE EXAM  Result Value Ref Range   HM Diabetic Eye Exam Retinopathy (A) No Retinopathy    Assessment & Plan:   Problem List Items Addressed This Visit       Cardiovascular and Mediastinum   Hypertension associated with diabetes   Relevant Orders   CBC with Differential/Platelet   CMP14+EGFR   Lipid panel   Bayer DCA Hb A1c Waived     Endocrine   Hyperlipidemia associated with type 2 diabetes mellitus   Relevant Orders   CBC with Differential/Platelet   CMP14+EGFR   Lipid panel   Bayer DCA Hb A1c Waived   Type 2 diabetes mellitus with circulatory disorder - Primary   Relevant Orders   CBC with Differential/Platelet   CMP14+EGFR   Lipid panel   Bayer DCA Hb A1c Waived   Microalbumin / creatinine urine ratio     Nervous and Auditory   Dementia    A1c 7.4, same as before, focus on diet, not going to change his medicines but focus on exercise and diet as well. Follow up plan: Return in about 3 months (around 08/05/2022), or if symptoms worsen or fail to improve, for Hypertension and hyperlipidemia and diabetes recheck.  Counseling provided for all of the vaccine components Orders Placed This Encounter  Procedures   CBC with Differential/Platelet   CMP14+EGFR   Lipid panel   Bayer DCA Hb A1c Waived   Microalbumin / creatinine urine ratio    Arville Care, MD [place] [facility] 05/06/2022, 9:01 AM

## 2022-05-13 ENCOUNTER — Telehealth: Payer: Self-pay | Admitting: Family Medicine

## 2022-05-13 NOTE — Telephone Encounter (Signed)
Pts wife called requesting to schedule pt an appt to see Raynelle Fanning.

## 2022-05-23 ENCOUNTER — Other Ambulatory Visit: Payer: Self-pay | Admitting: Family Medicine

## 2022-05-30 ENCOUNTER — Other Ambulatory Visit: Payer: Self-pay | Admitting: Cardiology

## 2022-05-30 ENCOUNTER — Other Ambulatory Visit: Payer: Self-pay | Admitting: Family Medicine

## 2022-06-03 ENCOUNTER — Ambulatory Visit (INDEPENDENT_AMBULATORY_CARE_PROVIDER_SITE_OTHER): Payer: Medicare Other | Admitting: Family Medicine

## 2022-06-03 ENCOUNTER — Other Ambulatory Visit: Payer: Self-pay

## 2022-06-03 ENCOUNTER — Encounter: Payer: Self-pay | Admitting: Family Medicine

## 2022-06-03 VITALS — BP 138/78 | HR 76 | Ht 66.0 in | Wt 227.0 lb

## 2022-06-03 DIAGNOSIS — E1129 Type 2 diabetes mellitus with other diabetic kidney complication: Secondary | ICD-10-CM

## 2022-06-03 DIAGNOSIS — R809 Proteinuria, unspecified: Secondary | ICD-10-CM | POA: Diagnosis not present

## 2022-06-03 DIAGNOSIS — Z7985 Long-term (current) use of injectable non-insulin antidiabetic drugs: Secondary | ICD-10-CM

## 2022-06-03 DIAGNOSIS — I152 Hypertension secondary to endocrine disorders: Secondary | ICD-10-CM

## 2022-06-03 DIAGNOSIS — E1169 Type 2 diabetes mellitus with other specified complication: Secondary | ICD-10-CM | POA: Diagnosis not present

## 2022-06-03 DIAGNOSIS — E785 Hyperlipidemia, unspecified: Secondary | ICD-10-CM

## 2022-06-03 DIAGNOSIS — E1159 Type 2 diabetes mellitus with other circulatory complications: Secondary | ICD-10-CM | POA: Diagnosis not present

## 2022-06-03 DIAGNOSIS — Z794 Long term (current) use of insulin: Secondary | ICD-10-CM

## 2022-06-03 MED ORDER — SEMAGLUTIDE (2 MG/DOSE) 8 MG/3ML ~~LOC~~ SOPN
2.0000 mg | PEN_INJECTOR | SUBCUTANEOUS | 3 refills | Status: DC
Start: 2022-06-03 — End: 2022-06-03

## 2022-06-03 MED ORDER — SEMAGLUTIDE (2 MG/DOSE) 8 MG/3ML ~~LOC~~ SOPN
2.0000 mg | PEN_INJECTOR | SUBCUTANEOUS | 3 refills | Status: DC
Start: 2022-06-03 — End: 2023-04-08

## 2022-06-03 NOTE — Progress Notes (Signed)
BP 138/78   Pulse 76   Ht 5\' 6"  (1.676 m)   Wt 227 lb (103 kg)   SpO2 97%   BMI 36.64 kg/m    Subjective:   Patient ID: [name], male    DOB: [birth year], 82 y.o.   MRN: [id number]  HPI: [name] is a 82 y.o. male presenting on 06/03/2022 for Discuss discontinuing metformin (Pt c/o multiple side effects)   HPI Type 2 diabetes mellitus Patient comes in today for recheck of his diabetes. Patient has been currently taking Ozempic 1 and Basaglar and Humalog and metformin, we will stop metformin because he feels like it is causing him stomach issues and dizziness and fatigue and wants to trial off of it.. Patient is not currently on an ACE inhibitor/ARB. Patient has not seen an ophthalmologist this year. Patient denies any new issues with their feet. The symptom started onset as an adult circulatory disorder and CAD and microalbuminuria and hyperlipidemia and hypertension ARE RELATED TO DM   Hypertension Patient is currently on furosemide and amlodipine and chlorthalidone and Imdur, and their blood pressure today is 138/78. Patient denies any lightheadedness or dizziness. Patient denies headaches, blurred vision, chest pains, shortness of breath, or weakness. Denies any side effects from medication and is content with current medication.   Hyperlipidemia Patient is coming in for recheck of his hyperlipidemia. The patient is currently taking atorvastatin. They deny any issues with myalgias or history of liver damage from it. They deny any focal numbness or weakness or chest pain.   Relevant past medical, surgical, family and social history reviewed and updated as indicated. Interim medical history since our last visit reviewed. Allergies and medications reviewed and updated.  Review of Systems  Constitutional:  Negative for chills and fever.  Eyes:  Negative for visual disturbance.  Respiratory:  Negative for shortness of breath and wheezing.   Cardiovascular:  Negative  for chest pain and leg swelling.  Musculoskeletal:  Positive for back pain. Negative for gait problem.  Skin:  Negative for rash.  Neurological:  Negative for dizziness.  Psychiatric/Behavioral:  Negative for decreased concentration and dysphoric mood (Denies depression but mainly just says he still has a lack of motivation but feels like that is more because of his issues). The patient is not nervous/anxious.   All other systems reviewed and are negative.   Per HPI unless specifically indicated above   Allergies as of 06/03/2022       Reactions   Ace Inhibitors Hives, Swelling, Rash   Rash,hives,tongue swelling   Angiotensin Receptor Blockers    Unknown reaction    Oxycodone-acetaminophen Other (See Comments)   Unknown reaction   Robaxin [methocarbamol] Other (See Comments)   Confusion    Toradol [ketorolac Tromethamine] Other (See Comments)   confusion   Valium Other (See Comments)   Hallucinations; confusion   Doxycycline Hives, Rash        Medication List        Accurate as of Jun 03, 2022  9:25 AM. If you have any questions, ask your nurse or doctor.          STOP taking these medications    metFORMIN 500 MG 24 hr tablet Commonly known as: GLUCOPHAGE-XR Stopped by: Nils Pyle, MD   Semaglutide (1 MG/DOSE) 4 MG/3ML Sopn Replaced by: Semaglutide (2 MG/DOSE) 8 MG/3ML Sopn Stopped by: Elige Radon Jaime Grizzell, MD       TAKE these medications    acetaminophen 650 MG  CR tablet Commonly known as: TYLENOL Take 1,300 mg by mouth 2 (two) times daily.   amLODipine 5 MG tablet Commonly known as: NORVASC TAKE 1 TABLET BY MOUTH DAILY   aspirin EC 81 MG tablet Take 81 mg by mouth at bedtime.   atorvastatin 40 MG tablet Commonly known as: LIPITOR TAKE 1 TABLET BY MOUTH IN THE  EVENING   Basaglar KwikPen 100 UNIT/ML Inject 56 Units into the skin at bedtime.   butalbital-acetaminophen-caffeine 50-325-40 MG tablet Commonly known as: FIORICET Take 1  tablet by mouth 2 (two) times daily as needed for headache.   cetirizine 10 MG tablet Commonly known as: ZYRTEC Take 10 mg by mouth daily with supper.   chlorthalidone 25 MG tablet Commonly known as: HYGROTON TAKE 1 TABLET BY MOUTH DAILY   cyanocobalamin 1000 MCG tablet Commonly known as: VITAMIN B12 Take 2,000 mcg by mouth daily.   Dexcom G7 Receiver Devi Use to test blood sugar continuously.  DX: E11.65 patient to use good rx card; receiver only   Dexcom G7 Sensor Misc by Does not apply route. APPLY SENSOR TO ARM EVERY 10 DAYS. SENT TO [facility]   docusate sodium 100 MG capsule Commonly known as: COLACE Take 100 mg by mouth daily as needed for mild constipation or moderate constipation.   donepezil 5 MG tablet Commonly known as: ARICEPT TAKE 1 TABLET BY MOUTH AT  BEDTIME   famotidine 20 MG tablet Commonly known as: PEPCID TAKE 1 TABLET BY MOUTH TWICE  DAILY WITH MEALS   Fish Oil 1200 MG Caps Take 1,200-2,400 mg by mouth See admin instructions. Tale 1200 mg in the morning and 2400 mg in the evening   furosemide 20 MG tablet Commonly known as: LASIX TAKE 1 TABLET BY MOUTH  DAILY AS NEEDED   hydrocortisone 2.5 % cream Apply topically as directed. Qd aa forehead and posterior neck prn itching   insulin lispro 100 UNIT/ML KwikPen Commonly known as: HumaLOG KwikPen Inject 20 Units into the skin 3 (three) times daily with meals.   isosorbide mononitrate 30 MG 24 hr tablet Commonly known as: IMDUR TAKE 1 TABLET BY MOUTH IN THE  EVENING   nitroGLYCERIN 0.4 MG SL tablet Commonly known as: NITROSTAT DISSOLVE ONE TABLET UNDER THE TONGUE EVERY 5 MINUTES AS NEEDED FOR CHEST PAIN.  DO NOT EXCEED A TOTAL OF 3 DOSES IN 15 MINUTES   OneTouch Ultra test strip Generic drug: glucose blood Use to test blood sugar 4 times daily as directed. Dx E11.59   PARoxetine 20 MG tablet Commonly known as: PAXIL TAKE 1 TABLET BY MOUTH DAILY   Pfizer-BioNT COVID-19  Vac-TriS Susp injection Generic drug: COVID-19 mRNA Vac-TriS (Pfizer)   Semaglutide (2 MG/DOSE) 8 MG/3ML Sopn Inject 2 mg as directed once a week. Replaces: Semaglutide (1 MG/DOSE) 4 MG/3ML Sopn Started by: Elige Radon Jeny Nield, MD   tamsulosin 0.4 MG Caps capsule Commonly known as: FLOMAX TAKE 1 CAPSULE BY MOUTH DAILY   Turmeric Curcumin 500 MG Caps Take 500 mg by mouth daily.   Vitamin D3 50 MCG (2000 UT) capsule Take 4,000 Units by mouth daily.         Objective:   BP 138/78   Pulse 76   Ht 5\' 6"  (1.676 m)   Wt 227 lb (103 kg)   SpO2 97%   BMI 36.64 kg/m   Wt Readings from Last 3 Encounters:  06/03/22 227 lb (103 kg)  05/06/22 209 lb (94.8 kg)  02/08/22 226 lb (  102.5 kg)    Physical Exam Vitals and nursing note reviewed.  Constitutional:      General: He is not in acute distress.    Appearance: He is well-developed. He is not diaphoretic.  Eyes:     General: No scleral icterus.    Conjunctiva/sclera: Conjunctivae normal.  Neck:     Thyroid: No thyromegaly.  Cardiovascular:     Rate and Rhythm: Normal rate and regular rhythm.     Heart sounds: Normal heart sounds. No murmur heard. Pulmonary:     Effort: Pulmonary effort is normal. No respiratory distress.     Breath sounds: Normal breath sounds. No wheezing.  Musculoskeletal:        General: No swelling. Normal range of motion.     Cervical back: Neck supple.  Lymphadenopathy:     Cervical: No cervical adenopathy.  Skin:    General: Skin is warm and dry.     Findings: No rash.  Neurological:     Mental Status: He is alert and oriented to person, place, and time.     Coordination: Coordination normal.  Psychiatric:        Behavior: Behavior normal.       Assessment & Plan:   Problem List Items Addressed This Visit       Cardiovascular and Mediastinum   Hypertension associated with diabetes (HCC)   Relevant Medications   Semaglutide, 2 MG/DOSE, 8 MG/3ML SOPN     Endocrine    Hyperlipidemia associated with type 2 diabetes mellitus (HCC) - Primary   Relevant Medications   Semaglutide, 2 MG/DOSE, 8 MG/3ML SOPN   Type 2 diabetes mellitus with circulatory disorder (HCC)   Relevant Medications   Semaglutide, 2 MG/DOSE, 8 MG/3ML SOPN   Microalbuminuria due to type 2 diabetes mellitus (HCC)   Relevant Medications   Semaglutide, 2 MG/DOSE, 8 MG/3ML SOPN    Weight loss should help his back issues were going to increase Ozempic, we will stop metformin because he feels like he is having side effects from being on metformin for several years  Seems like his depression is doing better since with decreased motivation but he feels like it is more because of back issues Follow up plan: Return if symptoms worsen or fail to improve, for Diabetes recheck 2 to 3 months.  Counseling provided for all of the vaccine components No orders of the defined types were placed in this encounter.   Arville Care, MD [facility] 06/03/2022, 9:25 AM

## 2022-06-08 ENCOUNTER — Telehealth: Payer: Self-pay | Admitting: Family Medicine

## 2022-06-08 NOTE — Telephone Encounter (Signed)
Called and spoke with patient wife she is aware what was called in

## 2022-06-09 ENCOUNTER — Telehealth: Payer: Self-pay

## 2022-06-09 NOTE — Telephone Encounter (Signed)
Received a shipment of Ozempic through patient assistance. Contacted patient and notified him it was ready for pick up. Patient verbalized understanding

## 2022-06-20 NOTE — Progress Notes (Unsigned)
    Cardiology Office Note  Date: 06/20/2022   ID: [name], DOB [birth year], MRN [id number]  History of Present Illness: [name] is an 82 y.o. male last seen in December 2023.  Physical Exam: VS:  There were no vitals taken for this visit., BMI There is no height or weight on file to calculate BMI.  Wt Readings from Last 3 Encounters:  06/03/22 227 lb (103 kg)  05/06/22 209 lb (94.8 kg)  02/08/22 226 lb (102.5 kg)    General: Patient appears comfortable at rest. HEENT: Conjunctiva and lids normal, oropharynx clear with moist mucosa. Neck: Supple, no elevated JVP or carotid bruits, no thyromegaly. Lungs: Clear to auscultation, nonlabored breathing at rest. Cardiac: Regular rate and rhythm, no S3 or significant systolic murmur, no pericardial rub. Abdomen: Soft, nontender, no hepatomegaly, bowel sounds present, no guarding or rebound. Extremities: No pitting edema, distal pulses 2+. Skin: Warm and dry. Musculoskeletal: No kyphosis. Neuropsychiatric: Alert and oriented x3, affect grossly appropriate.  ECG:  An ECG dated 08/19/2021 was personally reviewed today and demonstrated:  Sinus rhythm.  Labwork: 05/06/2022: ALT 22; AST 22; BUN 21; Creatinine, Ser 1.12; Hemoglobin 13.4; Platelets 292; Potassium 3.8; Sodium 138     Component Value Date/Time   CHOL 117 05/06/2022 0833   CHOL 139 06/02/2012 1007   TRIG 179 (H) 05/06/2022 0833   TRIG 185 (H) 04/10/2013 0937   TRIG 107 06/02/2012 1007   HDL 36 (L) 05/06/2022 0833   HDL 34 (L) 04/10/2013 0937   HDL 40 06/02/2012 1007   CHOLHDL 3.3 05/06/2022 0833   CHOLHDL 3.8 11/16/2019 0206   VLDL 25 11/16/2019 0206   LDLCALC 51 05/06/2022 0833   LDLCALC 62 04/10/2013 0937   LDLCALC 78 06/02/2012 1007   Other Studies Reviewed Today:  Echocardiogram 12/10/2021:  1. Left ventricular ejection fraction, by estimation, is 65 to 70%. The  left ventricle has normal function. The left ventricle has no regional  wall motion  abnormalities. There is mild left ventricular hypertrophy.  Left ventricular diastolic parameters  are consistent with Grade I diastolic dysfunction (impaired relaxation).  The average left ventricular global longitudinal strain is -23.2 %. The  global longitudinal strain is normal.   2. Right ventricular systolic function is normal. The right ventricular  size is normal.   3. The mitral valve is normal in structure. No evidence of mitral valve  regurgitation. No evidence of mitral stenosis.   4. 26 mm Edwards SAPIEN TAVR valve is present in the AV position. The  aortic valve has been repaired/replaced. Aortic valve regurgitation is not  visualized. No aortic stenosis is present.   5. The inferior vena cava is normal in size with greater than 50%  respiratory variability, suggesting right atrial pressure of 3 mmHg.   Assessment and Plan:  1.  History of severe degenerative calcific aortic stenosis status post TAVR in 2021 with 26 mm Edwards S3U.  Echocardiogram in November 2023 revealed LVEF 65 to 70% and stable TAVR function with no significant aortic regurgitation and a mean AV gradient 8.5 mmHg.  2.  CAD status post BMS to nondominant RCA in 2006 and DES x 2 to the mid LAD in 2021.  3.  Mixed hyperlipidemia, LDL 51 in April on Lipitor.  4.  Essential hypertension.  Disposition:  Follow up {follow up:15908}  Signed, Jonelle Sidle, M.D., F.A.C.C. [facility]

## 2022-06-21 ENCOUNTER — Encounter: Payer: Self-pay | Admitting: Cardiology

## 2022-06-21 ENCOUNTER — Ambulatory Visit: Payer: Medicare Other | Attending: Cardiology | Admitting: Cardiology

## 2022-06-21 VITALS — BP 140/70 | HR 82 | Ht 66.0 in | Wt 227.2 lb

## 2022-06-21 DIAGNOSIS — Z952 Presence of prosthetic heart valve: Secondary | ICD-10-CM | POA: Diagnosis not present

## 2022-06-21 DIAGNOSIS — E782 Mixed hyperlipidemia: Secondary | ICD-10-CM

## 2022-06-21 DIAGNOSIS — I10 Essential (primary) hypertension: Secondary | ICD-10-CM | POA: Diagnosis not present

## 2022-06-21 DIAGNOSIS — I25119 Atherosclerotic heart disease of native coronary artery with unspecified angina pectoris: Secondary | ICD-10-CM | POA: Diagnosis not present

## 2022-06-21 NOTE — Patient Instructions (Addendum)
Medication Instructions:  Your physician has recommended you make the following change in your medication:  Change aspirin to 81 mg every other day Continue all other medications the same  Labwork: none  Testing/Procedures: Your physician has requested that you have an echocardiogram in 6 months just before visit. Echocardiography is a painless test that uses sound waves to create images of your heart. It provides your doctor with information about the size and shape of your heart and how well your heart's chambers and valves are working. This procedure takes approximately one hour. There are no restrictions for this procedure. Please do NOT wear cologne, perfume, aftershave, or lotions (deodorant is allowed). Please arrive 15 minutes prior to your appointment time.  Follow-Up: Your physician recommends that you schedule a follow-up appointment in: 6 months  Any Other Special Instructions Will Be Listed Below (If Applicable).  If you need a refill on your cardiac medications before your next appointment, please call your pharmacy.

## 2022-06-23 DIAGNOSIS — E1165 Type 2 diabetes mellitus with hyperglycemia: Secondary | ICD-10-CM | POA: Diagnosis not present

## 2022-07-21 DIAGNOSIS — G4733 Obstructive sleep apnea (adult) (pediatric): Secondary | ICD-10-CM | POA: Diagnosis not present

## 2022-07-22 DIAGNOSIS — E113292 Type 2 diabetes mellitus with mild nonproliferative diabetic retinopathy without macular edema, left eye: Secondary | ICD-10-CM | POA: Diagnosis not present

## 2022-07-22 DIAGNOSIS — Z794 Long term (current) use of insulin: Secondary | ICD-10-CM | POA: Diagnosis not present

## 2022-07-22 LAB — HM DIABETES EYE EXAM

## 2022-08-06 ENCOUNTER — Ambulatory Visit: Payer: Medicare Other | Admitting: Family Medicine

## 2022-08-09 ENCOUNTER — Ambulatory Visit: Payer: Medicare Other | Admitting: Family Medicine

## 2022-08-11 ENCOUNTER — Telehealth: Payer: Self-pay | Admitting: Family Medicine

## 2022-08-11 ENCOUNTER — Ambulatory Visit (INDEPENDENT_AMBULATORY_CARE_PROVIDER_SITE_OTHER): Payer: Medicare Other | Admitting: Family Medicine

## 2022-08-11 ENCOUNTER — Encounter: Payer: Self-pay | Admitting: Family Medicine

## 2022-08-11 VITALS — BP 163/68 | HR 67 | Ht 66.0 in | Wt 227.0 lb

## 2022-08-11 DIAGNOSIS — E785 Hyperlipidemia, unspecified: Secondary | ICD-10-CM

## 2022-08-11 DIAGNOSIS — Z794 Long term (current) use of insulin: Secondary | ICD-10-CM | POA: Diagnosis not present

## 2022-08-11 DIAGNOSIS — E1169 Type 2 diabetes mellitus with other specified complication: Secondary | ICD-10-CM | POA: Diagnosis not present

## 2022-08-11 DIAGNOSIS — I2510 Atherosclerotic heart disease of native coronary artery without angina pectoris: Secondary | ICD-10-CM | POA: Diagnosis not present

## 2022-08-11 DIAGNOSIS — R809 Proteinuria, unspecified: Secondary | ICD-10-CM

## 2022-08-11 DIAGNOSIS — I152 Hypertension secondary to endocrine disorders: Secondary | ICD-10-CM

## 2022-08-11 DIAGNOSIS — E1140 Type 2 diabetes mellitus with diabetic neuropathy, unspecified: Secondary | ICD-10-CM | POA: Diagnosis not present

## 2022-08-11 DIAGNOSIS — E1159 Type 2 diabetes mellitus with other circulatory complications: Secondary | ICD-10-CM

## 2022-08-11 DIAGNOSIS — E1129 Type 2 diabetes mellitus with other diabetic kidney complication: Secondary | ICD-10-CM

## 2022-08-11 LAB — BAYER DCA HB A1C WAIVED: HB A1C (BAYER DCA - WAIVED): 7.6 % — ABNORMAL HIGH (ref 4.8–5.6)

## 2022-08-11 MED ORDER — DONEPEZIL HCL 5 MG PO TABS: 5.0000 mg | ORAL_TABLET | Freq: Every day | ORAL | 3 refills | Status: DC

## 2022-08-11 MED ORDER — PAROXETINE HCL 20 MG PO TABS: 20.0000 mg | ORAL_TABLET | Freq: Every day | ORAL | 3 refills | Status: DC

## 2022-08-11 MED ORDER — TAMSULOSIN HCL 0.4 MG PO CAPS: 0.4000 mg | ORAL_CAPSULE | Freq: Every day | ORAL | 3 refills | Status: DC

## 2022-08-11 MED ORDER — FAMOTIDINE 20 MG PO TABS: 20.0000 mg | ORAL_TABLET | Freq: Two times a day (BID) | ORAL | 3 refills | Status: DC

## 2022-08-11 NOTE — Telephone Encounter (Signed)
Pts wife called requesting to speak with PCPs nurse. Says its very important and personal about pts health.

## 2022-08-11 NOTE — Progress Notes (Signed)
BP (!) 163/68   Pulse 67   Ht 5\' 6"  (1.676 m)   Wt 227 lb (103 kg)   SpO2 95%   BMI 36.64 kg/m    Subjective:   Patient ID: [name], male    DOB: Jul 02, 1940, 82 y.o.   MRN: [id number]  HPI: [name] is a 82 y.o. male presenting on 08/11/2022 for Medical Management of Chronic Issues, Diabetes, and Hyperlipidemia   HPI Type 2 diabetes mellitus Patient comes in today for recheck of his diabetes. Patient has been currently taking Dexcom and Basaglar and Humalog and metformin and Ozempic, he did not bring his Dexcom in with him today so I cannot see the numbers. Patient is not currently on an ACE inhibitor/ARB. Patient has not seen an ophthalmologist this year. Patient is having some burning in his left foot and some neuropathy. The symptom started onset as an adult CAD and hypertension and hyperlipidemia and microalbuminuria and neuropathy ARE RELATED TO DM   Hypertension Patient is currently on amlodipine and chlorthalidone and Imdur, and their blood pressure today is 148/68 on recheck. Patient denies any lightheadedness or dizziness. Patient denies headaches, blurred vision, chest pains, shortness of breath, or weakness. Denies any side effects from medication and is content with current medication.   Hyperlipidemia Patient is coming in for recheck of his hyperlipidemia. The patient is currently taking atorvastatin and fish oils. They deny any issues with myalgias or history of liver damage from it. They deny any focal numbness or weakness or chest pain.   Relevant past medical, surgical, family and social history reviewed and updated as indicated. Interim medical history since our last visit reviewed. Allergies and medications reviewed and updated.  Review of Systems  Constitutional:  Negative for chills and fever.  Eyes:  Negative for visual disturbance.  Respiratory:  Negative for shortness of breath and wheezing.   Cardiovascular:  Negative for chest pain and  leg swelling.  Musculoskeletal:  Negative for back pain and gait problem.  Skin:  Negative for rash.  Neurological:  Positive for numbness (Tingling and numbness in left foot). Negative for dizziness, weakness and light-headedness.  All other systems reviewed and are negative.   Per HPI unless specifically indicated above   Allergies as of 08/11/2022       Reactions   Ace Inhibitors Hives, Swelling, Rash   Rash,hives,tongue swelling   Angiotensin Receptor Blockers    Unknown reaction    Oxycodone-acetaminophen Other (See Comments)   Unknown reaction   Robaxin [methocarbamol] Other (See Comments)   Confusion    Toradol [ketorolac Tromethamine] Other (See Comments)   confusion   Valium Other (See Comments)   Hallucinations; confusion   Doxycycline Hives, Rash        Medication List        Accurate as of August 11, 2022 10:03 AM. If you have any questions, ask your nurse or doctor.          acetaminophen 650 MG CR tablet Commonly known as: TYLENOL Take 1,300 mg by mouth 2 (two) times daily.   amLODipine 5 MG tablet Commonly known as: NORVASC TAKE 1 TABLET BY MOUTH DAILY   aspirin EC 81 MG tablet Take 81 mg by mouth every other day.   atorvastatin 40 MG tablet Commonly known as: LIPITOR TAKE 1 TABLET BY MOUTH IN THE  EVENING   Basaglar KwikPen 100 UNIT/ML Inject 56 Units into the skin at bedtime.   butalbital-acetaminophen-caffeine 50-325-40 MG tablet Commonly known  as: FIORICET Take 1 tablet by mouth 2 (two) times daily as needed for headache.   cetirizine 10 MG tablet Commonly known as: ZYRTEC Take 10 mg by mouth daily with supper.   chlorthalidone 25 MG tablet Commonly known as: HYGROTON TAKE 1 TABLET BY MOUTH DAILY   cyanocobalamin 1000 MCG tablet Commonly known as: VITAMIN B12 Take 2,000 mcg by mouth daily.   Dexcom G7 Receiver Devi Use to test blood sugar continuously.  DX: E11.65 patient to use good rx card; receiver only   Dexcom G7 Sensor  Misc by Does not apply route. APPLY SENSOR TO ARM EVERY 10 DAYS. SENT TO [facility]   docusate sodium 100 MG capsule Commonly known as: COLACE Take 100 mg by mouth daily as needed for mild constipation or moderate constipation.   donepezil 5 MG tablet Commonly known as: ARICEPT Take 1 tablet (5 mg total) by mouth at bedtime.   famotidine 20 MG tablet Commonly known as: PEPCID Take 1 tablet (20 mg total) by mouth 2 (two) times daily with a meal.   Fish Oil 1200 MG Caps Take 1,200-2,400 mg by mouth See admin instructions. Tale 1200 mg in the morning and 2400 mg in the evening   hydrocortisone 2.5 % cream Apply topically as directed. Qd aa forehead and posterior neck prn itching   insulin lispro 100 UNIT/ML KwikPen Commonly known as: HumaLOG KwikPen Inject 20 Units into the skin 3 (three) times daily with meals.   isosorbide mononitrate 30 MG 24 hr tablet Commonly known as: IMDUR TAKE 1 TABLET BY MOUTH IN THE  EVENING   metFORMIN 500 MG 24 hr tablet Commonly known as: GLUCOPHAGE-XR Take 500 mg by mouth 2 (two) times daily with a meal.   nitroGLYCERIN 0.4 MG SL tablet Commonly known as: NITROSTAT DISSOLVE ONE TABLET UNDER THE TONGUE EVERY 5 MINUTES AS NEEDED FOR CHEST PAIN.  DO NOT EXCEED A TOTAL OF 3 DOSES IN 15 MINUTES   OneTouch Ultra test strip Generic drug: glucose blood Use to test blood sugar 4 times daily as directed. Dx E11.59   PARoxetine 20 MG tablet Commonly known as: PAXIL Take 1 tablet (20 mg total) by mouth daily.   Semaglutide (2 MG/DOSE) 8 MG/3ML Sopn Inject 2 mg as directed once a week.   tamsulosin 0.4 MG Caps capsule Commonly known as: FLOMAX Take 1 capsule (0.4 mg total) by mouth daily.   Turmeric Curcumin 500 MG Caps Take 500 mg by mouth daily.   Vitamin D3 50 MCG (2000 UT) capsule Take 4,000 Units by mouth daily.         Objective:   BP (!) 163/68   Pulse 67   Ht 5\' 6"  (1.676 m)   Wt 227 lb (103 kg)   SpO2 95%    BMI 36.64 kg/m   Wt Readings from Last 3 Encounters:  08/11/22 227 lb (103 kg)  06/21/22 227 lb 3.2 oz (103.1 kg)  06/03/22 227 lb (103 kg)    Physical Exam Vitals and nursing note reviewed.  Constitutional:      General: He is not in acute distress.    Appearance: He is well-developed. He is not diaphoretic.  Eyes:     General: No scleral icterus.    Conjunctiva/sclera: Conjunctivae normal.  Neck:     Thyroid: No thyromegaly.  Cardiovascular:     Rate and Rhythm: Normal rate and regular rhythm.     Heart sounds: Normal heart sounds. No murmur heard. Pulmonary:  Effort: Pulmonary effort is normal. No respiratory distress.     Breath sounds: Normal breath sounds. No wheezing.  Musculoskeletal:        General: No swelling. Normal range of motion.     Cervical back: Neck supple.  Lymphadenopathy:     Cervical: No cervical adenopathy.  Skin:    General: Skin is warm and dry.     Findings: No rash.  Neurological:     Mental Status: He is alert and oriented to person, place, and time.     Coordination: Coordination normal.  Psychiatric:        Behavior: Behavior normal.       Assessment & Plan:   Problem List Items Addressed This Visit       Cardiovascular and Mediastinum   Hypertension associated with diabetes (HCC)   CORONARY ATHEROSCLEROSIS NATIVE CORONARY ARTERY     Endocrine   Hyperlipidemia associated with type 2 diabetes mellitus (HCC)   Type 2 diabetes mellitus with circulatory disorder (HCC) - Primary   Relevant Orders   Bayer DCA Hb A1c Waived   Microalbuminuria due to type 2 diabetes mellitus (HCC)   Type 2 diabetes mellitus with diabetic neuropathy, with long-term current use of insulin (HCC)    A1c is 7.6 which is slightly up, focus on diet and role with insulin.  Allowing some elevation slightly because of age and because of risk for hypoglycemia  Focusing on keeping control with his insulins and being more consistent with them.  Focus on diet as  well.  His diet is very poor with a lot of carbohydrates For blood pressure allowing permissive hypertension because of age. Follow up plan: Return in about 3 months (around 11/11/2022), or if symptoms worsen or fail to improve, for Diabetes recheck.  Counseling provided for all of the vaccine components Orders Placed This Encounter  Procedures   Bayer DCA Hb A1c Waived    Arville Care, MD [facility] 08/11/2022, 10:03 AM

## 2022-08-11 NOTE — Telephone Encounter (Signed)
Left message for wife to return call 

## 2022-08-13 NOTE — Telephone Encounter (Signed)
Wife was concerned that pt is not telling Dr. Louanne Skye everything when he is in the visit.  Advised wife if she was able to start coming with him to his appointments. States that she will start doing that. Wife seen the OV notes and said that nothing was mentioned in them about the things pt complains about to her.

## 2022-08-17 DIAGNOSIS — H04123 Dry eye syndrome of bilateral lacrimal glands: Secondary | ICD-10-CM | POA: Diagnosis not present

## 2022-08-17 DIAGNOSIS — H43813 Vitreous degeneration, bilateral: Secondary | ICD-10-CM | POA: Diagnosis not present

## 2022-08-17 DIAGNOSIS — E113493 Type 2 diabetes mellitus with severe nonproliferative diabetic retinopathy without macular edema, bilateral: Secondary | ICD-10-CM | POA: Diagnosis not present

## 2022-08-17 DIAGNOSIS — H02833 Dermatochalasis of right eye, unspecified eyelid: Secondary | ICD-10-CM | POA: Diagnosis not present

## 2022-08-17 DIAGNOSIS — H02836 Dermatochalasis of left eye, unspecified eyelid: Secondary | ICD-10-CM | POA: Diagnosis not present

## 2022-08-17 LAB — HM DIABETES EYE EXAM

## 2022-08-28 ENCOUNTER — Other Ambulatory Visit: Payer: Self-pay | Admitting: Family Medicine

## 2022-08-30 ENCOUNTER — Encounter: Payer: Self-pay | Admitting: *Deleted

## 2022-08-31 ENCOUNTER — Telehealth: Payer: Medicare Other | Admitting: Adult Health

## 2022-09-09 ENCOUNTER — Telehealth: Payer: Self-pay

## 2022-09-09 NOTE — Telephone Encounter (Signed)
Pt upset with appt being so far out and did not understand why Dr Dettinger could not just prescribe the medication for him since he has talked to him before. Explained to pt what Dr Dettinger said pt still upset refused to make an appt but agreed to be put on wait list.

## 2022-09-09 NOTE — Telephone Encounter (Signed)
Patient reports he is having worsening neuropathy in his left foot and would like to try Gabapentin to see if it would help.  His pharmacy is [facility].

## 2022-09-09 NOTE — Telephone Encounter (Signed)
It has been more than a month since we have seen him and this is a new medicine with some side effects that need to be discussed, please place a visit or make a visit for the patient so we can discuss this.  Can be put on a wait list if we do not have anything readily available

## 2022-09-09 NOTE — Telephone Encounter (Signed)
 Patient informed we have received four boxes of Ozempic and it will be placed in the refrigerator for him to pick up.

## 2022-10-14 ENCOUNTER — Other Ambulatory Visit: Payer: Self-pay | Admitting: Cardiology

## 2022-10-14 ENCOUNTER — Other Ambulatory Visit: Payer: Self-pay | Admitting: Family Medicine

## 2022-10-19 DIAGNOSIS — H43813 Vitreous degeneration, bilateral: Secondary | ICD-10-CM | POA: Diagnosis not present

## 2022-10-19 DIAGNOSIS — H04123 Dry eye syndrome of bilateral lacrimal glands: Secondary | ICD-10-CM | POA: Diagnosis not present

## 2022-10-19 DIAGNOSIS — E113491 Type 2 diabetes mellitus with severe nonproliferative diabetic retinopathy without macular edema, right eye: Secondary | ICD-10-CM | POA: Diagnosis not present

## 2022-10-19 DIAGNOSIS — E113412 Type 2 diabetes mellitus with severe nonproliferative diabetic retinopathy with macular edema, left eye: Secondary | ICD-10-CM | POA: Diagnosis not present

## 2022-10-19 DIAGNOSIS — H02833 Dermatochalasis of right eye, unspecified eyelid: Secondary | ICD-10-CM | POA: Diagnosis not present

## 2022-10-19 DIAGNOSIS — H02836 Dermatochalasis of left eye, unspecified eyelid: Secondary | ICD-10-CM | POA: Diagnosis not present

## 2022-10-23 ENCOUNTER — Other Ambulatory Visit: Payer: Self-pay | Admitting: Cardiology

## 2022-10-23 ENCOUNTER — Other Ambulatory Visit: Payer: Self-pay | Admitting: Family Medicine

## 2022-10-26 ENCOUNTER — Telehealth: Payer: Self-pay | Admitting: Family Medicine

## 2022-10-26 MED ORDER — TAMSULOSIN HCL 0.4 MG PO CAPS: 0.4000 mg | ORAL_CAPSULE | Freq: Every day | ORAL | 2 refills | Status: DC

## 2022-10-26 MED ORDER — PAROXETINE HCL 20 MG PO TABS: 20.0000 mg | ORAL_TABLET | Freq: Every day | ORAL | 2 refills | Status: DC

## 2022-10-26 NOTE — Telephone Encounter (Signed)
Remaining refills on Paroxetine & Tamsulosin sent to [facility] Please advise on Metformin, it is a historical med.

## 2022-10-27 MED ORDER — METFORMIN HCL ER 500 MG PO TB24: 500.0000 mg | ORAL_TABLET | Freq: Two times a day (BID) | ORAL | 3 refills | Status: DC

## 2022-10-27 NOTE — Telephone Encounter (Signed)
Sent Metformin to [facility]. Made wife aware that a year supply of Paroxetine and Tamsulosin were sent to [facility] in October. Will not send in script at this time for those two. Wife states that he has enough of those and that pt has a follow up in November. Made wife aware that she should call [facility] to confirm refills.

## 2022-10-27 NOTE — Addendum Note (Signed)
Addended by: Dorene Sorrow on: 10/27/2022 11:06 AM   Modules accepted: Orders

## 2022-10-27 NOTE — Telephone Encounter (Signed)
Patient should be taking the metformin 500 twice daily as far as I know, go ahead and send in refills for that as well, 90+3

## 2022-11-01 ENCOUNTER — Encounter: Payer: Self-pay | Admitting: Family Medicine

## 2022-11-01 ENCOUNTER — Telehealth: Payer: Self-pay | Admitting: Family Medicine

## 2022-11-01 NOTE — Telephone Encounter (Signed)
Wife said patient needs an appt. She states that it is urgent.  Offered appt with provider and only wanted to see Raynelle Fanning.

## 2022-11-12 ENCOUNTER — Ambulatory Visit: Payer: Medicare Other | Admitting: Family Medicine

## 2022-11-16 ENCOUNTER — Ambulatory Visit: Payer: Medicare Other | Admitting: Adult Health

## 2022-11-29 ENCOUNTER — Other Ambulatory Visit: Payer: Self-pay | Admitting: Family Medicine

## 2022-12-01 ENCOUNTER — Ambulatory Visit: Payer: Medicare Other | Admitting: Family Medicine

## 2022-12-13 ENCOUNTER — Ambulatory Visit: Payer: Medicare Other | Admitting: Family Medicine

## 2022-12-13 ENCOUNTER — Encounter: Payer: Self-pay | Admitting: Family Medicine

## 2022-12-13 VITALS — BP 162/71 | HR 80 | Ht 66.0 in | Wt 224.0 lb

## 2022-12-13 DIAGNOSIS — R809 Proteinuria, unspecified: Secondary | ICD-10-CM | POA: Diagnosis not present

## 2022-12-13 DIAGNOSIS — E1159 Type 2 diabetes mellitus with other circulatory complications: Secondary | ICD-10-CM | POA: Diagnosis not present

## 2022-12-13 DIAGNOSIS — I2510 Atherosclerotic heart disease of native coronary artery without angina pectoris: Secondary | ICD-10-CM

## 2022-12-13 DIAGNOSIS — Z794 Long term (current) use of insulin: Secondary | ICD-10-CM | POA: Diagnosis not present

## 2022-12-13 DIAGNOSIS — E1140 Type 2 diabetes mellitus with diabetic neuropathy, unspecified: Secondary | ICD-10-CM

## 2022-12-13 DIAGNOSIS — I152 Hypertension secondary to endocrine disorders: Secondary | ICD-10-CM

## 2022-12-13 DIAGNOSIS — E785 Hyperlipidemia, unspecified: Secondary | ICD-10-CM | POA: Diagnosis not present

## 2022-12-13 DIAGNOSIS — E11319 Type 2 diabetes mellitus with unspecified diabetic retinopathy without macular edema: Secondary | ICD-10-CM

## 2022-12-13 DIAGNOSIS — E1169 Type 2 diabetes mellitus with other specified complication: Secondary | ICD-10-CM | POA: Diagnosis not present

## 2022-12-13 DIAGNOSIS — T148XXA Other injury of unspecified body region, initial encounter: Secondary | ICD-10-CM

## 2022-12-13 DIAGNOSIS — E1129 Type 2 diabetes mellitus with other diabetic kidney complication: Secondary | ICD-10-CM | POA: Diagnosis not present

## 2022-12-13 DIAGNOSIS — Z23 Encounter for immunization: Secondary | ICD-10-CM

## 2022-12-13 LAB — BAYER DCA HB A1C WAIVED: HB A1C (BAYER DCA - WAIVED): 7.2 % — ABNORMAL HIGH (ref 4.8–5.6)

## 2022-12-13 MED ORDER — GABAPENTIN 100 MG PO CAPS: 100.0000 mg | ORAL_CAPSULE | Freq: Two times a day (BID) | ORAL | 3 refills | Status: DC

## 2022-12-13 MED ORDER — AMLODIPINE BESYLATE 10 MG PO TABS: 10.0000 mg | ORAL_TABLET | Freq: Every day | ORAL | 3 refills | Status: DC

## 2022-12-13 NOTE — Progress Notes (Signed)
BP (!) 162/71   Pulse 80   Ht 5\' 6"  (1.676 m)   Wt 224 lb (101.6 kg)   SpO2 97%   BMI 36.15 kg/m    Subjective:   Patient ID: [name], male    DOB: [birth year], 82 y.o.   MRN: [id number]  HPI: [name] is a 82 y.o. male presenting on 12/13/2022 for Medical Management of Chronic Issues, Hyperlipidemia, and Diabetes  Type 2 diabetes mellitus Patient comes in today for recheck of his diabetes. Patient is currently taking Basaglar, Humalog, metformin, and semaglutide. Fasting blood sugar ranges between 130-140s at home. Patient reports hypoglycemic episodes to 60s that occur in the evenings about three times per month. He is symptomatic during these episodes and is able to eat/drink something to increase his glucose. Patient has seen an ophthalmologist this year. He received treatment for diabetic retinopathy. Patient reports worsening neuropathy over the past 2 months. He tried shockwave therapy, which seemed to somewhat help. His diabetes is complicated by diabetic retinopathy, diabetic neuropathy, hypertension, and hyperlipidemia.    Hypertension Patient is currently taking amlodipine, chlorthalidone, and Imdur. His blood pressure today is 155/71. He has not recently taken his blood pressure at home. He has been eating more country ham, so he thinks that is contributing to the higher blood pressures. He denies lightheadedness or dizziness, headaches, vision changes, or chest pain.  Hyperlipidemia Patient is currently taking atorvastatin and fish oil. He denies myalgias or weakness. He does not have a history of liver damage from it.   Patient reports muscle pain in his neck/upper back that gradually developed. He denies any trauma to the area. He does spend a significant amount of time looking down at his phone. He also has arthritis pain in his hands.  Relevant past medical, surgical, family and social history reviewed and updated as indicated. Interim medical history  since our last visit reviewed. Allergies and medications reviewed and updated.  Review of Systems  Constitutional:  Negative for chills and fever.  HENT:  Negative for congestion, sinus pain and sore throat.   Eyes:  Positive for visual disturbance.       Diabetic retinopathy  Respiratory:  Negative for cough, chest tightness, shortness of breath and wheezing.   Cardiovascular:  Negative for chest pain, palpitations and leg swelling.  Gastrointestinal:  Negative for abdominal pain, constipation and diarrhea.  Genitourinary:  Positive for difficulty urinating and enuresis. Negative for dysuria and hematuria.       Urinary hesitancy and nocturia  Musculoskeletal:  Positive for arthralgias and neck pain. Negative for myalgias.  Neurological:  Positive for numbness. Negative for dizziness, weakness, light-headedness and headaches.       Numbness and tingling in his feet  Psychiatric/Behavioral:  Negative for sleep disturbance.     Per HPI unless specifically indicated above   Allergies as of 12/13/2022       Reactions   Ace Inhibitors Hives, Swelling, Rash   Rash,hives,tongue swelling   Angiotensin Receptor Blockers    Unknown reaction    Oxycodone-acetaminophen Other (See Comments)   Unknown reaction   Robaxin [methocarbamol] Other (See Comments)   Confusion    Toradol [ketorolac Tromethamine] Other (See Comments)   confusion   Valium Other (See Comments)   Hallucinations; confusion   Doxycycline Hives, Rash        Medication List        Accurate as of December 13, 2022 11:30 AM. If you have any questions, ask your  nurse or doctor.          acetaminophen 650 MG CR tablet Commonly known as: TYLENOL Take 1,300 mg by mouth 2 (two) times daily.   amLODipine 10 MG tablet Commonly known as: NORVASC Take 1 tablet (10 mg total) by mouth daily. What changed:  medication strength how much to take Changed by: Elige Radon Elianna Windom   aspirin EC 81 MG tablet Take 81 mg by  mouth every other day.   atorvastatin 40 MG tablet Commonly known as: LIPITOR TAKE 1 TABLET BY MOUTH IN THE  EVENING   Basaglar KwikPen 100 UNIT/ML Inject 56 Units into the skin at bedtime.   butalbital-acetaminophen-caffeine 50-325-40 MG tablet Commonly known as: FIORICET Take 1 tablet by mouth 2 (two) times daily as needed for headache.   cetirizine 10 MG tablet Commonly known as: ZYRTEC Take 10 mg by mouth daily with supper.   chlorthalidone 25 MG tablet Commonly known as: HYGROTON TAKE 1 TABLET BY MOUTH DAILY   cyanocobalamin 1000 MCG tablet Commonly known as: VITAMIN B12 Take 2,000 mcg by mouth daily.   Dexcom G7 Receiver Devi Use to test blood sugar continuously.  DX: E11.65 patient to use good rx card; receiver only   Dexcom G7 Sensor Misc by Does not apply route. APPLY SENSOR TO ARM EVERY 10 DAYS. SENT TO [facility]   docusate sodium 100 MG capsule Commonly known as: COLACE Take 100 mg by mouth daily as needed for mild constipation or moderate constipation.   donepezil 5 MG tablet Commonly known as: ARICEPT TAKE 1 TABLET BY MOUTH AT  BEDTIME   famotidine 20 MG tablet Commonly known as: PEPCID TAKE 1 TABLET BY MOUTH TWICE  DAILY WITH MEALS   Fish Oil 1200 MG Caps Take 1,200-2,400 mg by mouth See admin instructions. Tale 1200 mg in the morning and 2400 mg in the evening   gabapentin 100 MG capsule Commonly known as: NEURONTIN Take 1 capsule (100 mg total) by mouth 2 (two) times daily. Started by: Elige Radon Shakeerah Gradel   hydrocortisone 2.5 % cream Apply topically as directed. Qd aa forehead and posterior neck prn itching   insulin lispro 100 UNIT/ML KwikPen Commonly known as: HumaLOG KwikPen Inject 20 Units into the skin 3 (three) times daily with meals.   isosorbide mononitrate 30 MG 24 hr tablet Commonly known as: IMDUR TAKE 1 TABLET BY MOUTH IN THE  EVENING   metFORMIN 500 MG 24 hr tablet Commonly known as: GLUCOPHAGE-XR Take 1  tablet (500 mg total) by mouth 2 (two) times daily with a meal.   nitroGLYCERIN 0.4 MG SL tablet Commonly known as: NITROSTAT DISSOLVE ONE TABLET UNDER THE TONGUE EVERY 5 MINUTES AS NEEDED FOR CHEST PAIN.  DO NOT EXCEED A TOTAL OF 3 DOSES IN 15 MINUTES   OneTouch Ultra test strip Generic drug: glucose blood Use to test blood sugar 4 times daily as directed. Dx E11.59   PARoxetine 20 MG tablet Commonly known as: PAXIL Take 1 tablet (20 mg total) by mouth daily.   Semaglutide (2 MG/DOSE) 8 MG/3ML Sopn Inject 2 mg as directed once a week.   tamsulosin 0.4 MG Caps capsule Commonly known as: FLOMAX Take 1 capsule (0.4 mg total) by mouth daily.   Turmeric Curcumin 500 MG Caps Take 500 mg by mouth daily.   Vitamin D3 50 MCG (2000 UT) capsule Take 4,000 Units by mouth daily.         Objective:   BP (!) 162/71  Pulse 80   Ht 5\' 6"  (1.676 m)   Wt 224 lb (101.6 kg)   SpO2 97%   BMI 36.15 kg/m   Wt Readings from Last 3 Encounters:  12/13/22 224 lb (101.6 kg)  08/11/22 227 lb (103 kg)  06/21/22 227 lb 3.2 oz (103.1 kg)    Physical Exam Vitals and nursing note reviewed.  Constitutional:      General: He is not in acute distress.    Appearance: Normal appearance. He is obese.  HENT:     Head: Normocephalic and atraumatic.     Right Ear: External ear normal.     Left Ear: External ear normal.  Eyes:     Conjunctiva/sclera: Conjunctivae normal.  Cardiovascular:     Rate and Rhythm: Normal rate and regular rhythm.     Heart sounds: Normal heart sounds.  Pulmonary:     Effort: Pulmonary effort is normal.     Breath sounds: Normal breath sounds. No wheezing or rales.  Abdominal:     General: Abdomen is flat. There is no distension.     Palpations: Abdomen is soft.     Tenderness: There is no abdominal tenderness.     Comments: Diastasis recti  Musculoskeletal:     Cervical back: Normal range of motion and neck supple. No tenderness.     Right lower leg: No edema.      Left lower leg: No edema.     Comments: Tenderness to palpation over left trapezius  Skin:    General: Skin is warm and dry.  Neurological:     Mental Status: He is alert and oriented to person, place, and time.  Psychiatric:        Mood and Affect: Mood normal.        Behavior: Behavior normal.        Thought Content: Thought content normal.        Judgment: Judgment normal.    Assessment & Plan:   Problem List Items Addressed This Visit       Cardiovascular and Mediastinum   Hypertension associated with diabetes (HCC)   Relevant Medications   amLODipine (NORVASC) 10 MG tablet   Other Relevant Orders   CBC with Differential/Platelet (Completed)   CMP14+EGFR (Completed)   Lipid panel (Completed)   Bayer DCA Hb A1c Waived (Completed)   CORONARY ATHEROSCLEROSIS NATIVE CORONARY ARTERY   Relevant Medications   amLODipine (NORVASC) 10 MG tablet   Type 2 diabetes mellitus with circulatory disorder (HCC) - Primary   Relevant Medications   amLODipine (NORVASC) 10 MG tablet   Other Relevant Orders   CBC with Differential/Platelet (Completed)   CMP14+EGFR (Completed)   Lipid panel (Completed)   Bayer DCA Hb A1c Waived (Completed)     Endocrine   Hyperlipidemia associated with type 2 diabetes mellitus (HCC)   Relevant Medications   amLODipine (NORVASC) 10 MG tablet   Other Relevant Orders   CBC with Differential/Platelet (Completed)   CMP14+EGFR (Completed)   Lipid panel (Completed)   Bayer DCA Hb A1c Waived (Completed)   Microalbuminuria due to type 2 diabetes mellitus (HCC)   Type 2 diabetes mellitus with diabetic neuropathy, with long-term current use of insulin (HCC)   Relevant Medications   gabapentin (NEURONTIN) 100 MG capsule   Diabetic retinopathy (HCC)   Other Visit Diagnoses       Encounter for immunization       Relevant Orders   Flu Vaccine Trivalent High Dose (Fluad) (Completed)  Muscle strain           Blood pressure elevated at 155/71. Will  increase amlodipine to 10 mg daily. Encouraged patient to monitor blood pressure at home and notify us with any issues. HgbA1c slightly improved at 7.2%. Will not increase insulin at this time since patient has occasional hypoglycemic episodes. Will start gabapentin 100 mg twice daily for diabetic neuropathy. Patient is tolerating statin well. Will recheck lipid panel today. Recommended patient try Voltaren gel for arthritis pain and neck stretches for the trapezius pain/tightness.  Follow up plan: Return in about 3 months (around 03/13/2023), or if symptoms worsen or fail to improve.  Counseling provided for all of the vaccine components Orders Placed This Encounter  Procedures   Flu Vaccine Trivalent High Dose (Fluad)   CBC with Differential/Platelet   CMP14+EGFR   Lipid panel   Bayer DCA Hb A1c Waived    Gillermina Phy, Medical Student [facility] 12/13/2022, 11:30 AM  Patient seen and examined with Gillermina Phy, medical student.  Agree with assessment and plan above.  Patient will monitor blood pressure closely at home and then notify us if not elevated or if it is running normal.  Patient having some hypoglycemic episodes so recommended following diet to get A1c down. Arville Care, MD [facility] 12/24/2022, 7:52 AM

## 2022-12-14 LAB — CBC WITH DIFFERENTIAL/PLATELET
Basophils Absolute: 0.1 10*3/uL (ref 0.0–0.2)
Basos: 1 %
EOS (ABSOLUTE): 0.1 10*3/uL (ref 0.0–0.4)
Eos: 2 %
Hematocrit: 44.4 % (ref 37.5–51.0)
Hemoglobin: 14 g/dL (ref 13.0–17.7)
Immature Grans (Abs): 0 10*3/uL (ref 0.0–0.1)
Immature Granulocytes: 0 %
Lymphocytes Absolute: 1.8 10*3/uL (ref 0.7–3.1)
Lymphs: 22 %
MCH: 28.2 pg (ref 26.6–33.0)
MCHC: 31.5 g/dL (ref 31.5–35.7)
MCV: 89 fL (ref 79–97)
Monocytes Absolute: 0.5 10*3/uL (ref 0.1–0.9)
Monocytes: 6 %
Neutrophils Absolute: 5.5 10*3/uL (ref 1.4–7.0)
Neutrophils: 69 %
Platelets: 299 10*3/uL (ref 150–450)
RBC: 4.97 x10E6/uL (ref 4.14–5.80)
RDW: 13.8 % (ref 11.6–15.4)
WBC: 8 10*3/uL (ref 3.4–10.8)

## 2022-12-14 LAB — LIPID PANEL
Cholesterol, Total: 124 mg/dL (ref 100–199)
HDL: 36 mg/dL — ABNORMAL LOW (ref >39)
LDL CALC COMMENT:: 3.4 ratio (ref 0.0–5.0)
LDL Chol Calc (NIH): 58 mg/dL (ref 0–99)
Triglycerides: 182 mg/dL — ABNORMAL HIGH (ref 0–149)
VLDL Cholesterol Cal: 30 mg/dL (ref 5–40)

## 2022-12-14 LAB — CMP14+EGFR
ALT: 16 IU/L (ref 0–44)
AST: 18 IU/L (ref 0–40)
Albumin: 4.1 g/dL (ref 3.7–4.7)
Alkaline Phosphatase: 78 IU/L (ref 44–121)
BUN/Creatinine Ratio: 14 (ref 10–24)
BUN: 18 mg/dL (ref 8–27)
Bilirubin Total: 0.5 mg/dL (ref 0.0–1.2)
CO2: 22 mmol/L (ref 20–29)
Calcium: 9.6 mg/dL (ref 8.6–10.2)
Chloride: 100 mmol/L (ref 96–106)
Creatinine, Ser: 1.31 mg/dL — ABNORMAL HIGH (ref 0.76–1.27)
Globulin, Total: 2.7 g/dL (ref 1.5–4.5)
Glucose: 146 mg/dL — ABNORMAL HIGH (ref 70–99)
Potassium: 3.7 mmol/L (ref 3.5–5.2)
Sodium: 141 mmol/L (ref 134–144)
Total Protein: 6.8 g/dL (ref 6.0–8.5)
eGFR: 54 mL/min/{1.73_m2} — ABNORMAL LOW (ref >59)

## 2022-12-21 ENCOUNTER — Ambulatory Visit (HOSPITAL_COMMUNITY)
Admission: RE | Admit: 2022-12-21 | Discharge: 2022-12-21 | Disposition: A | Discharge status: Home / Self Care | Payer: Medicare Other | Source: Ambulatory Visit | Attending: Cardiology | Admitting: Cardiology

## 2022-12-21 DIAGNOSIS — Z952 Presence of prosthetic heart valve: Secondary | ICD-10-CM | POA: Insufficient documentation

## 2022-12-21 LAB — ECHOCARDIOGRAM COMPLETE
AR max vel: 1.87 cm2
AV Area VTI: 2.34 cm2
AV Area mean vel: 1.93 cm2
AV Mean grad: 4 mm[Hg]
AV Peak grad: 8.2 mm[Hg]
Ao pk vel: 1.43 m/s
Area-P 1/2: 3.37 cm2
Calc EF: 62.3 %
MV VTI: 2.06 cm2
S' Lateral: 3.9 cm
Single Plane A2C EF: 62.4 %
Single Plane A4C EF: 65.4 %

## 2022-12-21 NOTE — Progress Notes (Signed)
  Echocardiogram 2D Echocardiogram has been performed.  Ocie Doyne RDCS 12/21/2022, 1:55 PM

## 2022-12-25 DIAGNOSIS — Z794 Long term (current) use of insulin: Secondary | ICD-10-CM | POA: Diagnosis not present

## 2022-12-25 DIAGNOSIS — E1165 Type 2 diabetes mellitus with hyperglycemia: Secondary | ICD-10-CM | POA: Diagnosis not present

## 2022-12-30 ENCOUNTER — Ambulatory Visit: Payer: Medicare Other | Attending: Cardiology | Admitting: Cardiology

## 2022-12-30 ENCOUNTER — Encounter: Payer: Self-pay | Admitting: Cardiology

## 2022-12-30 VITALS — BP 138/64 | HR 75 | Wt 228.2 lb

## 2022-12-30 DIAGNOSIS — I25119 Atherosclerotic heart disease of native coronary artery with unspecified angina pectoris: Secondary | ICD-10-CM

## 2022-12-30 DIAGNOSIS — I10 Essential (primary) hypertension: Secondary | ICD-10-CM | POA: Diagnosis not present

## 2022-12-30 DIAGNOSIS — E782 Mixed hyperlipidemia: Secondary | ICD-10-CM

## 2022-12-30 NOTE — Progress Notes (Signed)
Cardiology Office Note  Date: 12/30/2022   ID: [name], DOB 1940/03/14, MRN [id number]  History of Present Illness: [name] is an 82 y.o. male last seen in June.  He is here for a routine visit.  Reports stable NYHA class II dyspnea with typical activities.  No exertional chest pain, no palpitations or syncope.  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  I reviewed his medications.  Current cardiovascular regimen includes aspirin, Norvasc, Lipitor, chlorthalidone, Imdur, omega-3 supplements, Ozempic, and as needed nitroglycerin.  Recent LDL 58.  I reviewed his ECG today which shows normal sinus rhythm.  Physical Exam: VS:  BP 138/64   Pulse 75   Wt 228 lb 3.2 oz (103.5 kg)   SpO2 97%   BMI 36.83 kg/m , BMI Body mass index is 36.83 kg/m.  Wt Readings from Last 3 Encounters:  12/30/22 228 lb 3.2 oz (103.5 kg)  12/13/22 224 lb (101.6 kg)  08/11/22 227 lb (103 kg)    General: Patient appears comfortable at rest. HEENT: Conjunctiva and lids normal. Neck: Supple, no elevated JVP or carotid bruits. Lungs: Clear to auscultation, nonlabored breathing at rest. Cardiac: Regular rate and rhythm, no S3, 2/6 systolic murmur.  ECG:  An ECG dated 08/19/2021 was personally reviewed today and demonstrated:  Sinus rhythm.  Labwork: 12/13/2022: ALT 16; AST 18; BUN 18; Creatinine, Ser 1.31; Hemoglobin 14.0; Platelets 299; Potassium 3.7; Sodium 141     Component Value Date/Time   CHOL 124 12/13/2022 1013   CHOL 139 06/02/2012 1007   TRIG 182 (H) 12/13/2022 1013   TRIG 185 (H) 04/10/2013 0937   TRIG 107 06/02/2012 1007   HDL 36 (L) 12/13/2022 1013   HDL 34 (L) 04/10/2013 0937   HDL 40 06/02/2012 1007   CHOLHDL 3.4 12/13/2022 1013   CHOLHDL 3.8 11/16/2019 0206   VLDL 25 11/16/2019 0206   LDLCALC 58 12/13/2022 1013   LDLCALC 62 04/10/2013 0937   LDLCALC 78 06/02/2012 1007   Other Studies Reviewed Today:  Echocardiogram  12/21/2022:  1. Left ventricular ejection fraction, by estimation, is 60 to 65%. The  left ventricle has normal function. The left ventricle has no regional  wall motion abnormalities. Left ventricular diastolic parameters are  consistent with Grade I diastolic  dysfunction (impaired relaxation).   2. Right ventricular systolic function is normal. The right ventricular  size is normal.   3. The mitral valve is normal in structure. No evidence of mitral valve  regurgitation. No evidence of mitral stenosis.   4. The aortic valve has been repaired/replaced. Aortic valve  regurgitation is not visualized. No aortic stenosis is present. There is a  26 mm Sapien prosthetic (TAVR) valve present in the aortic position. Echo  findings are consistent with normal  structure and function of the aortic valve prosthesis. Aortic valve mean  gradient measures 4.0 mmHg.   5. Aortic dilatation noted. There is borderline dilatation of the aortic  root, measuring 39 mm. There is borderline dilatation of the ascending  aorta, measuring 38 mm.   6. The inferior vena cava is normal in size with greater than 50%  respiratory variability, suggesting right atrial pressure of 3 mmHg.   Assessment and Plan:  1.  History of severe degenerative calcific aortic stenosis status post TAVR in 2021 with 26 mm Edwards S3U.  Recent follow-up echocardiogram shows LVEF 60 to 65% with stable TAVR, no aortic regurgitation, and mean AV  gradient 4 mmHg.  There is borderline to mild dilatation of the aortic root and ascending aorta.  Continue aspirin.   2.  CAD status post BMS to nondominant RCA in 2006 and DES x 2 to the mid LAD in 2021.  No active angina at this time.  Continue aspirin, Lipitor, Imdur, Ozempic and as needed nitroglycerin.   3.  Mixed hyperlipidemia, recent LDL 58.  Continue Lipitor.   4.  Primary hypertension.  No change in current regimen including Norvasc and chlorthalidone.  Disposition:  Follow up  6  months.  Signed, Jonelle Sidle, M.D., F.A.C.C. [facility]

## 2022-12-30 NOTE — Patient Instructions (Signed)

## 2023-01-28 ENCOUNTER — Telehealth: Payer: Self-pay

## 2023-01-28 ENCOUNTER — Other Ambulatory Visit (HOSPITAL_COMMUNITY): Payer: Self-pay

## 2023-01-28 DIAGNOSIS — Z794 Long term (current) use of insulin: Secondary | ICD-10-CM

## 2023-02-01 NOTE — Progress Notes (Signed)
 Pharmacy Medication Assistance Program Note    03/15/2023  Patient ID: [name], male   DOB: 02/21/1940, 83 y.o.   MRN: [id number]     01/28/2023  Outreach Medication One  Manufacturer Medication One [employer] [facility] Ozempic  Type of Assistance Manufacturer Assistance  Date Application Sent to Patient 02/01/2023  Application Items Requested Application;Proof of Income  Date Application Sent to Prescriber 03/04/2023  Date Application Received From Patient 03/01/2023  Application Items Received From Patient Application  Date Application Received From Provider 03/08/2023  Date Application Submitted to Manufacturer 03/15/2023  Method Application Sent to Manufacturer Fax    RENEWAL SUBMITTED

## 2023-02-01 NOTE — Progress Notes (Signed)
 Pharmacy Medication Assistance Program Note    03/15/2023  Patient ID: JERAN HILTZ, male  DOB: [birth year], 83 y.o.  MRN:  [id number]     01/28/2023  Outreach Medication Two  Manufacturer Medication Two [employer] [facility] Basaglar  Type of Assistance Manufacturer Assistance  Date Application Sent to Patient 02/01/2023  Application Items Requested Application  Date Application Sent to Prescriber 03/04/2023  Date Application Received From Patient 03/01/2023  Method Application Sent to Manufacturer Fax  Date Application Submitted to Manufacturer 03/15/2023       03/15/2023  Patient ID: [name], male  DOB: Feb 19, 1940, 83 y.o.  MRN:  [id number]     01/28/2023  Outreach Medication Three  Manufacturer Medication Three [facility] Humalog  Type of Assistance Manufacturer Assistance  Date Application Sent to Patient 02/01/2023  Application Items Requested Application  Date Application Sent to Prescriber 03/04/2023  Date Application Received From Patient 03/01/2023  Application Items Received From Patient Application;Proof of Income  Date Application Submitted to Manufacturer 03/15/2023  Method Application Sent to Manufacturer Fax    FAXED RENEWAL   Please send new RX's for both Basaglar and Humalog kwikpens to [facility] (90 day supplies). Thanks!

## 2023-02-04 ENCOUNTER — Other Ambulatory Visit: Payer: Self-pay | Admitting: Family Medicine

## 2023-02-07 ENCOUNTER — Telehealth: Payer: Self-pay | Admitting: *Deleted

## 2023-02-07 NOTE — Telephone Encounter (Signed)
Pt last seen 08-2021 (last order was for a mask refit).  Next appt 03-24-2023. Fax from [facility] stating pt asking for supplies.  Order started.  If your ok to proceed sign off and will fax to [facility].

## 2023-02-08 ENCOUNTER — Encounter: Payer: Self-pay | Admitting: Family Medicine

## 2023-02-08 DIAGNOSIS — I152 Hypertension secondary to endocrine disorders: Secondary | ICD-10-CM

## 2023-02-08 NOTE — Telephone Encounter (Addendum)
Pt's wife said could not send the reply to the survey for the MyChart Video Visit on Friday. She said will answer the questions at the appointment.

## 2023-02-08 NOTE — Telephone Encounter (Signed)
I called pt and relayed that can do a mychart VV for him on this Friday at 0915. Pt last seen 08-2021.   I sent mychart message/appt and it did come up on his wife's phone, so should be able to do this.  He is compliant, needs new order, then faxed to [facility] [phone].  With order and [id number].  I will keep the March appt at this time and if makes the Friday 02-11-2023 VV then will cancel the 03/2023 OV.  Pt ok with this.  Will send ESS to do.

## 2023-02-09 ENCOUNTER — Other Ambulatory Visit: Payer: Self-pay | Admitting: Family Medicine

## 2023-02-09 DIAGNOSIS — I152 Hypertension secondary to endocrine disorders: Secondary | ICD-10-CM

## 2023-02-09 MED ORDER — METFORMIN HCL ER 500 MG PO TB24: 1000.0000 mg | ORAL_TABLET | Freq: Two times a day (BID) | ORAL | 3 refills | Status: DC

## 2023-02-09 MED ORDER — CARVEDILOL 6.25 MG PO TABS: 6.2500 mg | ORAL_TABLET | Freq: Two times a day (BID) | ORAL | 1 refills | Status: DC

## 2023-02-09 NOTE — Progress Notes (Signed)
BP up slightly, sent a low-dose carvedilol to his pharmacy that he can try twice a day and continue to monitor blood pressures

## 2023-02-09 NOTE — Addendum Note (Signed)
Addended by: Dorene Sorrow on: 02/09/2023 01:43 PM   Modules accepted: Orders

## 2023-02-09 NOTE — Progress Notes (Signed)
ESS: 9

## 2023-02-10 ENCOUNTER — Ambulatory Visit: Payer: Medicare Other

## 2023-02-10 VITALS — Ht 66.0 in | Wt 228.0 lb

## 2023-02-10 DIAGNOSIS — Z0000 Encounter for general adult medical examination without abnormal findings: Secondary | ICD-10-CM | POA: Diagnosis not present

## 2023-02-10 NOTE — Progress Notes (Signed)
Subjective:   [name] is a 83 y.o. male who presents for Medicare Annual/Subsequent preventive examination.  Visit Complete: Virtual I connected with  [name] on 02/10/23 by a audio enabled telemedicine application and verified that I am speaking with the correct person using two identifiers.  Patient Location: Home  Provider Location: Home Office  This patient declined Interactive audio and video telecommunications. Therefore the visit was completed with audio only.  I discussed the limitations of evaluation and management by telemedicine. The patient expressed understanding and agreed to proceed.  Vital Signs: Because this visit was a virtual/telehealth visit, some criteria may be missing or patient reported. Any vitals not documented were not able to be obtained and vitals that have been documented are patient reported.  Patient Medicare AWV questionnaire was completed by the patient on 02/08/23; I have confirmed that all information answered by patient is correct and no changes since this date.  Cardiac Risk Factors include: advanced age (>60men, >32 women);diabetes mellitus;dyslipidemia;male gender;hypertension     Objective:    Today's Vitals   02/10/23 1316  Weight: 228 lb (103.4 kg)  Height: 5\' 6"  (1.676 m)   Body mass index is 36.8 kg/m.     02/10/2023    1:26 PM 02/08/2022    1:07 PM 11/28/2020    4:11 PM 07/01/2020   10:53 PM 07/01/2020   12:33 PM 11/30/2019    2:05 PM 11/22/2019   10:10 AM  Advanced Directives  Does Patient Have a Medical Advance Directive? Yes Yes Yes Yes No Yes Yes  Type of Estate agent of [place];Living will Living will;Healthcare Power of [employer] Power of [place];Living will Healthcare Power of [employer] of [place];Living will Healthcare Power of [place];Living will  Does patient want to make changes to medical advance directive? No - Patient declined No - Patient  declined  No - Patient declined   No - Patient declined  Copy of Healthcare Power of Attorney in Chart? Yes - validated most recent copy scanned in chart (See row information) Yes - validated most recent copy scanned in chart (See row information) Yes - validated most recent copy scanned in chart (See row information) No - copy requested  No - copy requested No - copy requested  Would patient like information on creating a medical advance directive?    No - Patient declined No - Patient declined      Current Medications (verified) Outpatient Encounter Medications as of 02/10/2023  Medication Sig   acetaminophen (TYLENOL) 650 MG CR tablet Take 1,300 mg by mouth 2 (two) times daily.   amLODipine (NORVASC) 10 MG tablet Take 1 tablet (10 mg total) by mouth daily.   aspirin EC 81 MG tablet Take 81 mg by mouth every other day.   atorvastatin (LIPITOR) 40 MG tablet TAKE 1 TABLET BY MOUTH IN THE  EVENING   butalbital-acetaminophen-caffeine (FIORICET) 50-325-40 MG tablet Take 1 tablet by mouth 2 (two) times daily as needed for headache.   carvedilol (COREG) 6.25 MG tablet Take 1 tablet (6.25 mg total) by mouth 2 (two) times daily with a meal.   cetirizine (ZYRTEC) 10 MG tablet Take 10 mg by mouth daily with supper.    chlorthalidone (HYGROTON) 25 MG tablet TAKE 1 TABLET BY MOUTH DAILY   Cholecalciferol (VITAMIN D3) 50 MCG (2000 UT) capsule Take 4,000 Units by mouth daily.    Continuous Blood Gluc Receiver (DEXCOM G7 RECEIVER) DEVI Use to test blood sugar continuously.  DX: E11.65 patient to use good rx card; receiver only   Continuous Blood Gluc Sensor (DEXCOM G7 SENSOR) MISC by Does not apply route. APPLY SENSOR TO ARM EVERY 10 DAYS. SENT TO [facility]   docusate sodium (COLACE) 100 MG capsule Take 100 mg by mouth daily as needed for mild constipation or moderate constipation.    donepezil (ARICEPT) 5 MG tablet TAKE 1 TABLET BY MOUTH AT  BEDTIME   famotidine (PEPCID) 20 MG tablet TAKE 1  TABLET BY MOUTH TWICE  DAILY WITH MEALS   gabapentin (NEURONTIN) 100 MG capsule Take 1 capsule (100 mg total) by mouth 2 (two) times daily.   glucose blood (ONETOUCH ULTRA) test strip Use to test blood sugar 4 times daily as directed. Dx E11.59   hydrocortisone 2.5 % cream Apply topically as directed. Qd aa forehead and posterior neck prn itching   Insulin Glargine (BASAGLAR KWIKPEN) 100 UNIT/ML Inject 56 Units into the skin at bedtime.   insulin lispro (HUMALOG KWIKPEN) 100 UNIT/ML KwikPen Inject 20 Units into the skin 3 (three) times daily with meals.   isosorbide mononitrate (IMDUR) 30 MG 24 hr tablet TAKE 1 TABLET BY MOUTH IN THE  EVENING   metFORMIN (GLUCOPHAGE-XR) 500 MG 24 hr tablet Take 2 tablets (1,000 mg total) by mouth 2 (two) times daily with a meal.   nitroGLYCERIN (NITROSTAT) 0.4 MG SL tablet DISSOLVE ONE TABLET UNDER THE TONGUE EVERY 5 MINUTES AS NEEDED FOR CHEST PAIN.  DO NOT EXCEED A TOTAL OF 3 DOSES IN 15 MINUTES   Omega-3 Fatty Acids (FISH OIL) 1200 MG CAPS Take 1,200-2,400 mg by mouth See admin instructions. Tale 1200 mg in the morning and 2400 mg in the evening   PARoxetine (PAXIL) 20 MG tablet Take 1 tablet (20 mg total) by mouth daily.   Semaglutide, 2 MG/DOSE, 8 MG/3ML SOPN Inject 2 mg as directed once a week.   tamsulosin (FLOMAX) 0.4 MG CAPS capsule Take 1 capsule (0.4 mg total) by mouth daily.   Turmeric Curcumin 500 MG CAPS Take 500 mg by mouth daily.   vitamin B-12 (CYANOCOBALAMIN) 1000 MCG tablet Take 2,000 mcg by mouth daily.   No facility-administered encounter medications on file as of 02/10/2023.    Allergies (verified) Ace inhibitors, Angiotensin receptor blockers, Oxycodone-acetaminophen, Robaxin [methocarbamol], Toradol [ketorolac tromethamine], Valium, and Doxycycline   History: Past Medical History:  Diagnosis Date   Actinic keratosis 02/02/2021   R upper back   Anxiety    Aortic atherosclerosis (HCC) 12/19/2017   Aortic stenosis    26 mm Edwards  S3U THV November 2021   Arthritis    Bursitis of hip    Cervical spondylosis    Chronic headaches    Coronary atherosclerosis of native coronary artery    BMS nondominant RCA 12/2004, DES x2 to mid LAD 11/2019   Essential hypertension    GERD (gastroesophageal reflux disease)    History of adenomatous polyp of colon    History of kidney stones    History of MI (myocardial infarction) 12/2004   History of viral meningitis 02/28/2018   December 2019   Hyperlipidemia    Internal hemorrhoids    Low back pain    Nocturia more than twice per night    OSA (obstructive sleep apnea)    Sleep apnea    Spinal stenosis    Type 2 diabetes mellitus treated with insulin (HCC)    UTI (urinary tract infection)    Wears dentures    Upper  Past Surgical History:  Procedure Laterality Date   CARDIAC CATHETERIZATION  06-14-2004  dr Riley Kill   total occlusion mD1, RI 30-40%, mRCA nondominant hazy 80% and scattered 30-40%,  ef 71% (medical mangement)   CARDIAC VALVE REPLACEMENT     CARDIOVASCULAR STRESS TEST  09-17-2015   dr Diona Browner   Low risk nuclear study w/ reversible mild small anteroapical defect,  normal LV function and wall motion, nuclear stress ef 61%   CATARACT EXTRACTION W/ INTRAOCULAR LENS IMPLANT Right 07/2014   COLONOSCOPY  09/10/2008   normal   CORONARY ANGIOPLASTY WITH STENT PLACEMENT  12-17-2004   dr benismhon/ dr Juanda Chance   nonobstructive cad LAD and LCFx,  BMS x1 to total occlusion RCA    CORONARY ATHERECTOMY  11/22/2019   CORONARY ATHERECTOMY N/A 11/22/2019   Procedure: CORONARY ATHERECTOMY;  Surgeon: Kathleene Hazel, MD;  Location: [facility] INVASIVE CV LAB;  Service: Cardiovascular;  Laterality: N/A;   CYSTOSCOPY W/ URETEROSCOPY W/ LITHOTRIPSY  08/2010   ERCP  03/14/2008   EYE SURGERY Bilateral    cataracts   JOINT REPLACEMENT     LAPAROSCOPIC CHOLECYSTECTOMY  05/2012   LEFT HEART CATHETERIZATION WITH CORONARY ANGIOGRAM N/A 05/14/2011   Procedure: LEFT HEART  CATHETERIZATION WITH CORONARY ANGIOGRAM;  Surgeon: Tonny Bollman, MD;  Location: [facility] CATH LAB;  Service: Cardiovascular;  Laterality: N/A;    non-obstructive LM, LAD, LCFx, patent RCA stent   LUMBAR LAMINECTOMY/DECOMPRESSION MICRODISCECTOMY N/A 02/24/2017   Procedure: Microlumbar decompression L4-5, L5-S1;  Surgeon: Jene Every, MD;  Location: [facility] ORS;  Service: Orthopedics;  Laterality: N/A;  120 mins   RIGHT/LEFT HEART CATH AND CORONARY ANGIOGRAPHY N/A 11/14/2019   Procedure: RIGHT/LEFT HEART CATH AND CORONARY ANGIOGRAPHY;  Surgeon: Kathleene Hazel, MD;  Location: [facility] INVAS[facility] CV LAB;  Service: Cardiovascular;  Laterality: N/A;   ROTATOR CUFF REPAIR Right 03/2002   ROTATOR CUFF REPAIR Left 12/11/2015   TEE WITHOUT CARDIOVERSION N/A 12/04/2019   Procedure: TRANSESOPHAGEAL ECHOCARDIOGRAM (TEE);  Surgeon: Kathleene Hazel, MD;  Location: [facility] INVASIVE CV LAB;  Service: Open Heart Surgery;  Laterality: N/A;   TOTAL HIP ARTHROPLASTY Right 12/20/2008   TOTAL HIP ARTHROPLASTY Left 07/05/2018   Procedure: TOTAL HIP ARTHROPLASTY ANTERIOR APPROACH;  Surgeon: Ollen Gross, MD;  Location: [facility] ORS;  Service: Orthopedics;  Laterality: Left;    TOTAL KNEE ARTHROPLASTY Bilateral left 12-11-2003/  right 07-31-2004   dr Lequita Halt [facility]   TRANSCATHETER AORTIC VALVE REPLACEMENT, TRANSFEMORAL N/A 12/04/2019   Procedure: TRANSCATHETER AORTIC VALVE REPLACEMENT, TRANSFEMORAL;  Surgeon: Kathleene Hazel, MD;  Location: [facility] INVAS[facility] CV LAB;  Service: Open Heart Surgery;  Laterality: N/A;   UPPER GASTROINTESTINAL ENDOSCOPY  01/08/2008   bx, inlet patch, duodenitis   YAG LASER APPLICATION Right 07/29/2014   Procedure: YAG LASER APPLICATION;  Surgeon: Susa Simmonds, MD;  Location: [facility] ORS;  Service: Ophthalmology;  Laterality: Right;   Family History  Problem Relation Age of Onset   Colon cancer Mother        Diagnosed age 49   Cancer Mother 22   Heart disease Father    Heart attack Father     Breast cancer Sister    Brain cancer Sister    Heart disease Brother    Heart disease Brother    Bladder Cancer Brother    Cancer - Other Brother    Sleep apnea Neg Hx    Social History   Socioeconomic History   Marital status: Married    Spouse name: Dione    Number  of children: 1   Years of education: HS   Highest education level: 12th grade  Occupational History   Occupation: RETIRED    Employer: [employer]    Comment: Power plant  Tobacco Use   Smoking status: Former    Current packs/day: 0.00    Average packs/day: 1 pack/day for 25.6 years (25.6 ttl pk-yrs)    Types: Cigarettes    Start date: 01/18/1963    Quit date: 08/11/1988    Years since quitting: 34.5   Smokeless tobacco: Former    Types: Chew    Quit date: 01/11/1989   Tobacco comments:    chewed 1 pack tobacco/day for 15 years  Vaping Use   Vaping status: Never Used  Substance and Sexual Activity   Alcohol use: No   Drug use: No   Sexual activity: Not Currently  Other Topics Concern   Not on file  Social History Narrative   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 between [phone]   Social Drivers of Health   Financial Resource Strain: Low Risk  (02/10/2023)   Overall Financial Resource Strain (CARDIA)    Difficulty of Paying Living Expenses: Not hard at all  Food Insecurity: No Food Insecurity (02/10/2023)   Hunger Vital Sign    Worried About Running Out of Food in the Last Year: Never true    Ran Out of Food in the Last Year: Never true  Transportation Needs: No Transportation Needs (02/10/2023)   PRAPARE - Administrator, Civil Service (Medical): No    Lack of Transportation (Non-Medical): No  Physical Activity: Inactive (02/10/2023)   Exercise Vital Sign    Days of Exercise per Week: 0 days    Minutes of  Exercise per Session: 0 min  Stress: No Stress Concern Present (02/10/2023)   [employer] of Occupational Health - Occupational Stress Questionnaire    Feeling of Stress : Not at all  Social Connections: Unknown (02/10/2023)   Social Connection and Isolation Panel [NHANES]    Frequency of Communication with Friends and Family: More than three times a week    Frequency of Social Gatherings with Friends and Family: Three times a week    Attends Religious Services: Patient declined    Active Member of Clubs or Organizations: No    Attends Banker Meetings: Never    Marital Status: Married    Tobacco Counseling Counseling given: Not Answered Tobacco comments: chewed 1 pack tobacco/day for 15 years   Clinical Intake:  Pre-visit preparation completed: Yes  Pain : No/denies pain     Diabetes: Yes CBG done?: No Did pt. bring in CBG monitor from home?: No  How often do you need to have someone help you when you read instructions, pamphlets, or other written materials from your doctor or pharmacy?: 1 - Never  Interpreter Needed?: No  Information entered by :: Kandis Fantasia LPN   Activities of Daily Living    02/08/2023    4:28 PM  In your present state of health, do you have any difficulty performing the following activities:  Hearing? 1  Vision? 0  Difficulty concentrating or making decisions? 1  Walking or climbing stairs? 1  Dressing or bathing? 0  Doing errands, shopping? 0  Preparing Food and eating ? N  Using the Toilet? N  In the past six months, have you accidently leaked urine? N  Do you have problems with loss of bowel control? N  Managing your Medications? N  Managing your Finances? N  Housekeeping or managing your Housekeeping? N    Patient Care Team: Dettinger, Elige Radon, MD as PCP - General (Family Medicine) Jonelle Sidle, MD as PCP - Cardiology (Cardiology) Kathleene Hazel, MD as PCP - Structural Heart  (Cardiology) Jonelle Sidle, MD as Consulting Physician (Cardiology) Eugenia Mcalpine, MD (Inactive) as Consulting Physician (Orthopedic Surgery) Ollen Gross, MD as Consulting Physician (Orthopedic Surgery) Sheran Luz, MD as Consulting Physician (Physical Medicine and Rehabilitation) Danella Maiers, [facility] (Pharmacist) Janalyn Harder, MD (Inactive) as Consulting Physician (Dermatology) [facility]  Indicate any recent Medical Services you may have received from other than [facility] providers in the past year (date may be approximate).     Assessment:   This is a routine wellness examination for Tung.  Hearing/Vision screen Hearing Screening - Comments:: Hard of hearing; wears hearing aids  Vision Screening - Comments:: Wears rx glasses - up to date with routine eye exams with [facility]     Goals Addressed   None   Depression Screen    02/10/2023    1:25 PM 12/13/2022   10:11 AM 08/11/2022    9:33 AM 06/03/2022    9:11 AM 05/06/2022    8:36 AM 05/06/2022    8:34 AM 02/08/2022    1:06 PM  PHQ 2/9 Scores  PHQ - 2 Score 0 0 1 1 0 0 0  PHQ- 9 Score 1   4 2       Fall Risk    02/08/2023    4:28 PM 12/13/2022   10:11 AM 08/11/2022    9:33 AM 06/03/2022    9:10 AM 05/06/2022    8:33 AM  Fall Risk   Falls in the past year? 0 0 0 0 0  Number falls in past yr: 0      Injury with Fall? 0      Risk for fall due to : No Fall Risks      Follow up Education provided;Falls prevention discussed;Falls evaluation completed        MEDICARE RISK AT HOME: Medicare Risk at Home Any stairs in or around the home?: (Patient-Rptd) Yes If so, are there any without handrails?: (Patient-Rptd) No Home free of loose throw rugs in walkways, pet beds, electrical cords, etc?: (Patient-Rptd) Yes Adequate lighting in your home to reduce risk of falls?: (Patient-Rptd) Yes Life alert?: (Patient-Rptd) No Use of a cane, walker or w/c?: (Patient-Rptd) No Grab bars in the  bathroom?: (Patient-Rptd) Yes Shower chair or bench in shower?: (Patient-Rptd) Yes Elevated toilet seat or a handicapped toilet?: (Patient-Rptd) Yes  TIMED UP AND GO:  Was the test performed?  No    Cognitive Function:    05/14/2016    3:57 PM  MMSE - Mini Mental State Exam  Orientation to time 5  Orientation to Place 5  Registration 3  Attention/ Calculation 5  Recall 3  Language- name 2 objects 2  Language- repeat 1  Language- follow 3 step command 3  Language- read & follow direction 1  Write a sentence 1  Copy design 1  Total score 30        02/10/2023    1:27 PM 02/08/2022    1:08 PM 11/28/2020  4:08 PM 06/07/2019    9:07 AM 06/02/2018    8:55 AM  6CIT Screen  What Year? 0 points 0 points 0 points 0 points 0 points  What month? 0 points 0 points 0 points 0 points 0 points  What time? 0 points 0 points 0 points 0 points 0 points  Count back from 20 0 points 0 points 0 points 0 points 0 points  Months in reverse 2 points 0 points 0 points 0 points 0 points  Repeat phrase 2 points 4 points 2 points 0 points 4 points  Total Score 4 points 4 points 2 points 0 points 4 points    Immunizations Immunization History  Administered Date(s) Administered   Fluad Quad(high Dose 65+) 10/20/2020, 10/30/2021   Fluad Trivalent(High Dose 65+) 12/13/2022   Influenza, High Dose Seasonal PF 10/29/2015, 10/31/2017   Influenza, Quadrivalent, Recombinant, Inj, Pf 10/19/2018   Influenza,inj,Quad PF,6+ Mos 11/08/2013, 10/26/2016   Influenza,inj,quad, With Preservative 10/26/2016   Influenza-Unspecified 11/18/2014, 10/25/2019, 10/25/2019   PFIZER(Purple Top)SARS-COV-2 Vaccination 04/13/2019, 05/07/2019, 11/08/2019, 09/10/2020   Pneumococcal Conjugate-13 01/01/2013, 10/26/2016   Pneumococcal Polysaccharide-23 10/26/2016   Tdap 01/01/2013   Zoster Recombinant(Shingrix) 05/14/2016, 01/27/2017    TDAP status: Due, Education has been provided regarding the importance of this vaccine.  Advised may receive this vaccine at local pharmacy or Health Dept. Aware to provide a copy of the vaccination record if obtained from local pharmacy or Health Dept. Verbalized acceptance and understanding.  Flu Vaccine status: Up to date  Pneumococcal vaccine status: Up to date  Covid-19 vaccine status: Information provided on how to obtain vaccines.   Qualifies for Shingles Vaccine? Yes   Zostavax completed No   Shingrix Completed?: Yes  Screening Tests Health Maintenance  Topic Date Due   COVID-19 Vaccine (5 - 2024-25 season) 09/12/2022   DTaP/Tdap/Td (2 - Td or Tdap) 01/02/2023   FOOT EXAM  05/06/2023   HEMOGLOBIN A1C  06/13/2023   Diabetic kidney evaluation - Urine ACR  08/11/2023   OPHTHALMOLOGY EXAM  08/17/2023   Diabetic kidney evaluation - eGFR measurement  12/13/2023   Medicare Annual Wellness (AWV)  02/10/2024   Pneumonia Vaccine 63+ Years old  Completed   INFLUENZA VACCINE  Completed   Zoster Vaccines- Shingrix  Completed   HPV VACCINES  Aged Out   Hepatitis C Screening  Discontinued    Health Maintenance  Health Maintenance Due  Topic Date Due   COVID-19 Vaccine (5 - 2024-25 season) 09/12/2022   DTaP/Tdap/Td (2 - Td or Tdap) 01/02/2023    Colorectal cancer screening: No longer required.   Lung Cancer Screening: (Low Dose CT Chest recommended if Age 93-80 years, 20 pack-year currently smoking OR have quit w/in 15years.) does not qualify.   Lung Cancer Screening Referral: n/a  Additional Screening:  Hepatitis C Screening: does qualify; Completed 10/01/19  Vision Screening: Recommended annual ophthalmology exams for early detection of glaucoma and other disorders of the eye. Is the patient up to date with their annual eye exam?  Yes  Who is the provider or what is the name of the office in which the patient attends annual eye exams? [facility] If pt is not established with a provider, would they like to be referred to a provider to establish care? No .    Dental Screening: Recommended annual dental exams for proper oral hygiene  Diabetic Foot Exam: Diabetic Foot Exam: Completed 08/17/22  Community Resource Referral / Chronic Care Management: CRR required this visit?  No  CCM required this visit?  No     Plan:     I have personally reviewed and noted the following in the patient's chart:   Medical and social history Use of alcohol, tobacco or illicit drugs  Current medications and supplements including opioid prescriptions. Patient is not currently taking opioid prescriptions. Functional ability and status Nutritional status Physical activity Advanced directives List of other physicians Hospitalizations, surgeries, and ER visits in previous 12 months Vitals Screenings to include cognitive, depression, and falls Referrals and appointments  In addition, I have reviewed and discussed with patient certain preventive protocols, quality metrics, and best practice recommendations. A written personalized care plan for preventive services as well as general preventive health recommendations were provided to patient.     Kandis Fantasia [place], [place]   04/19/8117   After Visit Summary: (MyChart) Due to this being a telephonic visit, the after visit summary with patients personalized plan was offered to patient via MyChart   Nurse Notes: No concerns at this time

## 2023-02-10 NOTE — Patient Instructions (Signed)
Mr. Monds , Thank you for taking time to come for your Medicare Wellness Visit. I appreciate your ongoing commitment to your health goals. Please review the following plan we discussed and let me know if I can assist you in the future.   Referrals/Orders/Follow-Ups/Clinician Recommendations: Aim for 30 minutes of exercise or brisk walking, 6-8 glasses of water, and 5 servings of fruits and vegetables each day.  This is a list of the screening recommended for you and due dates:  Health Maintenance  Topic Date Due   COVID-19 Vaccine (5 - 2024-25 season) 09/12/2022   DTaP/Tdap/Td vaccine (2 - Td or Tdap) 01/02/2023   Complete foot exam   05/06/2023   Hemoglobin A1C  06/13/2023   Yearly kidney health urinalysis for diabetes  08/11/2023   Eye exam for diabetics  08/17/2023   Yearly kidney function blood test for diabetes  12/13/2023   Medicare Annual Wellness Visit  02/10/2024   Pneumonia Vaccine  Completed   Flu Shot  Completed   Zoster (Shingles) Vaccine  Completed   HPV Vaccine  Aged Out   Hepatitis C Screening  Discontinued    Advanced directives: (In Chart) A copy of your advanced directives are scanned into your chart should your provider ever need it.  Next Medicare Annual Wellness Visit scheduled for next year: Yes

## 2023-02-10 NOTE — Progress Notes (Signed)
PATIENT: [name] DOB: 11-27-1940  REASON FOR VISIT: follow up HISTORY FROM: patient   Virtual Visit via Video Note  I connected with [name] on 02/11/23 at  9:15 AM EST by a video enabled telemedicine application located remotely at [facility] and verified that I am speaking with the correct person using two identifiers who was located at their own home.   I discussed the limitations of evaluation and management by telemedicine and the availability of in person appointments. The patient expressed understanding and agreed to proceed.   PATIENT: [name] DOB: [birth year]  REASON FOR VISIT: follow up HISTORY FROM: patient  HISTORY OF PRESENT ILLNESS: Today 02/11/23:  [name] is a 83 y.o. male with a history of obstructive sleep apnea on CPAP. Returns today for follow-up.  Reports that CPAP is working well.  He states that he never sleeps without it.  His download is below       REVIEW OF SYSTEMS: Out of a complete 14 system review of symptoms, the patient complains only of the following symptoms, and all other reviewed systems are negative.  ALLERGIES: Allergies  Allergen Reactions   Ace Inhibitors Hives, Swelling and Rash    Rash,hives,tongue swelling   Angiotensin Receptor Blockers     Unknown reaction    Oxycodone-Acetaminophen Other (See Comments)    Unknown reaction   Robaxin [Methocarbamol] Other (See Comments)    Confusion    Toradol [Ketorolac Tromethamine] Other (See Comments)    confusion   Valium Other (See Comments)    Hallucinations; confusion   Doxycycline Hives and Rash    HOME MEDICATIONS: Outpatient Medications Prior to Visit  Medication Sig Dispense Refill   acetaminophen (TYLENOL) 650 MG CR tablet Take 1,300 mg by mouth 2 (two) times daily.     amLODipine (NORVASC) 10 MG tablet Take 1 tablet (10 mg total) by mouth daily. 90 tablet 3   aspirin EC 81 MG tablet Take 81 mg by mouth  every other day.     atorvastatin (LIPITOR) 40 MG tablet TAKE 1 TABLET BY MOUTH IN THE  EVENING 100 tablet 2   butalbital-acetaminophen-caffeine (FIORICET) 50-325-40 MG tablet Take 1 tablet by mouth 2 (two) times daily as needed for headache. 14 tablet 0   carvedilol (COREG) 6.25 MG tablet Take 1 tablet (6.25 mg total) by mouth 2 (two) times daily with a meal. 180 tablet 1   cetirizine (ZYRTEC) 10 MG tablet Take 10 mg by mouth daily with supper.      chlorthalidone (HYGROTON) 25 MG tablet TAKE 1 TABLET BY MOUTH DAILY 100 tablet 2   Cholecalciferol (VITAMIN D3) 50 MCG (2000 UT) capsule Take 4,000 Units by mouth daily.      Continuous Blood Gluc Receiver (DEXCOM G7 RECEIVER) DEVI Use to test blood sugar continuously.  DX: E11.65 patient to use good rx card; receiver only 1 each 0   Continuous Blood Gluc Sensor (DEXCOM G7 SENSOR) MISC by Does not apply route. APPLY SENSOR TO ARM EVERY 10 DAYS. SENT TO [facility]     docusate sodium (COLACE) 100 MG capsule Take 100 mg by mouth daily as needed for mild constipation or moderate constipation.      donepezil (ARICEPT) 5 MG tablet TAKE 1 TABLET BY MOUTH AT  BEDTIME 100 tablet 0   famotidine (PEPCID) 20 MG tablet TAKE 1 TABLET BY MOUTH TWICE  DAILY WITH MEALS 200 tablet 0   gabapentin (NEURONTIN) 100  MG capsule Take 1 capsule (100 mg total) by mouth 2 (two) times daily. 180 capsule 3   glucose blood (ONETOUCH ULTRA) test strip Use to test blood sugar 4 times daily as directed. Dx E11.59 400 each 3   hydrocortisone 2.5 % cream Apply topically as directed. Qd aa forehead and posterior neck prn itching 30 g 3   Insulin Glargine (BASAGLAR KWIKPEN) 100 UNIT/ML Inject 56 Units into the skin at bedtime. 45 mL 5   insulin lispro (HUMALOG KWIKPEN) 100 UNIT/ML KwikPen Inject 20 Units into the skin 3 (three) times daily with meals. 45 mL 11   isosorbide mononitrate (IMDUR) 30 MG 24 hr tablet TAKE 1 TABLET BY MOUTH IN THE  EVENING 100 tablet 2    metFORMIN (GLUCOPHAGE-XR) 500 MG 24 hr tablet Take 2 tablets (1,000 mg total) by mouth 2 (two) times daily with a meal. 360 tablet 3   nitroGLYCERIN (NITROSTAT) 0.4 MG SL tablet DISSOLVE ONE TABLET UNDER THE TONGUE EVERY 5 MINUTES AS NEEDED FOR CHEST PAIN.  DO NOT EXCEED A TOTAL OF 3 DOSES IN 15 MINUTES 25 tablet 3   Omega-3 Fatty Acids (FISH OIL) 1200 MG CAPS Take 1,200-2,400 mg by mouth See admin instructions. Tale 1200 mg in the morning and 2400 mg in the evening     PARoxetine (PAXIL) 20 MG tablet Take 1 tablet (20 mg total) by mouth daily. 100 tablet 2   Semaglutide, 2 MG/DOSE, 8 MG/3ML SOPN Inject 2 mg as directed once a week. 6 mL 3   tamsulosin (FLOMAX) 0.4 MG CAPS capsule Take 1 capsule (0.4 mg total) by mouth daily. 100 capsule 2   Turmeric Curcumin 500 MG CAPS Take 500 mg by mouth daily.     vitamin B-12 (CYANOCOBALAMIN) 1000 MCG tablet Take 2,000 mcg by mouth daily.     No facility-administered medications prior to visit.    PAST MEDICAL HISTORY: Past Medical History:  Diagnosis Date   Actinic keratosis 02/02/2021   R upper back   Anxiety    Aortic atherosclerosis (HCC) 12/19/2017   Aortic stenosis    26 mm Edwards S3U THV November 2021   Arthritis    Bursitis of hip    Cervical spondylosis    Chronic headaches    Coronary atherosclerosis of native coronary artery    BMS nondominant RCA 12/2004, DES x2 to mid LAD 11/2019   Essential hypertension    GERD (gastroesophageal reflux disease)    History of adenomatous polyp of colon    History of kidney stones    History of MI (myocardial infarction) 12/2004   History of viral meningitis 02/28/2018   December 2019   Hyperlipidemia    Internal hemorrhoids    Low back pain    Nocturia more than twice per night    OSA (obstructive sleep apnea)    Sleep apnea    Spinal stenosis    Type 2 diabetes mellitus treated with insulin (HCC)    UTI (urinary tract infection)    Wears dentures    Upper    PAST SURGICAL  HISTORY: Past Surgical History:  Procedure Laterality Date   CARDIAC CATHETERIZATION  06-14-2004  dr Riley Kill   total occlusion mD1, RI 30-40%, mRCA nondominant hazy 80% and scattered 30-40%,  ef 71% (medical mangement)   CARDIAC VALVE REPLACEMENT     CARDIOVASCULAR STRESS TEST  09-17-2015   dr Diona Browner   Low risk nuclear study w/ reversible mild small anteroapical defect,  normal LV function and wall motion,  nuclear stress ef 61%   CATARACT EXTRACTION W/ INTRAOCULAR LENS IMPLANT Right 07/2014   COLONOSCOPY  09/10/2008   normal   CORONARY ANGIOPLASTY WITH STENT PLACEMENT  12-17-2004   dr benismhon/ dr Juanda Chance   nonobstructive cad LAD and LCFx,  BMS x1 to total occlusion RCA    CORONARY ATHERECTOMY  11/22/2019   CORONARY ATHERECTOMY N/A 11/22/2019   Procedure: CORONARY ATHERECTOMY;  Surgeon: Kathleene Hazel, MD;  Location: [facility] INVASIVE CV LAB;  Service: Cardiovascular;  Laterality: N/A;   CYSTOSCOPY W/ URETEROSCOPY W/ LITHOTRIPSY  08/2010   ERCP  03/14/2008   EYE SURGERY Bilateral    cataracts   JOINT REPLACEMENT     LAPAROSCOPIC CHOLECYSTECTOMY  05/2012   LEFT HEART CATHETERIZATION WITH CORONARY ANGIOGRAM N/A 05/14/2011   Procedure: LEFT HEART CATHETERIZATION WITH CORONARY ANGIOGRAM;  Surgeon: Tonny Bollman, MD;  Location: [facility] CATH LAB;  Service: Cardiovascular;  Laterality: N/A;    non-obstructive LM, LAD, LCFx, patent RCA stent   LUMBAR LAMINECTOMY/DECOMPRESSION MICRODISCECTOMY N/A 02/24/2017   Procedure: Microlumbar decompression L4-5, L5-S1;  Surgeon: Jene Every, MD;  Location: [facility] ORS;  Service: Orthopedics;  Laterality: N/A;  120 mins   RIGHT/LEFT HEART CATH AND CORONARY ANGIOGRAPHY N/A 11/14/2019   Procedure: RIGHT/LEFT HEART CATH AND CORONARY ANGIOGRAPHY;  Surgeon: Kathleene Hazel, MD;  Location: [facility] INVASIVE CV LAB;  Service: Cardiovascular;  Laterality: N/A;   ROTATOR CUFF REPAIR Right 03/2002   ROTATOR CUFF REPAIR Left 12/11/2015   TEE WITHOUT CARDIOVERSION N/A  12/04/2019   Procedure: TRANSESOPHAGEAL ECHOCARDIOGRAM (TEE);  Surgeon: Kathleene Hazel, MD;  Location: [facility] INVASIVE CV LAB;  Service: Open Heart Surgery;  Laterality: N/A;   TOTAL HIP ARTHROPLASTY Right 12/20/2008   TOTAL HIP ARTHROPLASTY Left 07/05/2018   Procedure: TOTAL HIP ARTHROPLASTY ANTERIOR APPROACH;  Surgeon: Ollen Gross, MD;  Location: [facility];  Service: Orthopedics;  Laterality: Left;    TOTAL KNEE ARTHROPLASTY Bilateral left 12-11-2003/  right 07-31-2004   dr [facility]   TRANSCATHETER AORTIC VALVE REPLACEMENT, TRANSFEMORAL N/A 12/04/2019   Procedure: TRANSCATHETER AORTIC VALVE REPLACEMENT, TRANSFEMORAL;  Surgeon: Kathleene Hazel, MD;  Location: [facility] INVASIVE CV LAB;  Service: Open Heart Surgery;  Laterality: N/A;   UPPER GASTROINTESTINAL ENDOSCOPY  01/08/2008   bx, inlet patch, duodenitis   YAG LASER APPLICATION Right 07/29/2014   Procedure: YAG LASER APPLICATION;  Surgeon: Susa Simmonds, MD;  Location: [facility];  Service: Ophthalmology;  Laterality: Right;    FAMILY HISTORY: Family History  Problem Relation Age of Onset   Colon cancer Mother        Diagnosed age 52   Cancer Mother 55   Heart disease Father    Heart attack Father    Breast cancer Sister    Brain cancer Sister    Heart disease Brother    Heart disease Brother    Bladder Cancer Brother    Cancer - Other Brother    Sleep apnea Neg Hx     SOCIAL HISTORY: Social History   Socioeconomic History   Marital status: Married    Spouse name: Dione    Number of children: 1   Years of education: HS   Highest education level: 12th grade  Occupational History   Occupation: RETIRED    Associate Professor: [employer]    Comment: Power plant  Tobacco Use   Smoking status: Former    Current packs/day: 0.00    Average packs/day: 1 pack/day for 25.6 years (25.6 ttl pk-yrs)    Types: Cigarettes  Start date: 01/18/1963    Quit date: 08/11/1988    Years since quitting: 34.5   Smokeless tobacco:  Former    Types: Chew    Quit date: 01/11/1989   Tobacco comments:    chewed 1 pack tobacco/day for 15 years  Vaping Use   Vaping status: Never Used  Substance and Sexual Activity   Alcohol use: No   Drug use: No   Sexual activity: Not Currently  Other Topics Concern   Not on file  Social History Narrative   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 between [phone]   Social Drivers of Health   Financial Resource Strain: Low Risk  (02/10/2023)   Overall Financial Resource Strain (CARDIA)    Difficulty of Paying Living Expenses: Not hard at all  Food Insecurity: No Food Insecurity (02/10/2023)   Hunger Vital Sign    Worried About Running Out of Food in the Last Year: Never true    Ran Out of Food in the Last Year: Never true  Transportation Needs: No Transportation Needs (02/10/2023)   PRAPARE - Administrator, Civil Service (Medical): No    Lack of Transportation (Non-Medical): No  Physical Activity: Inactive (02/10/2023)   Exercise Vital Sign    Days of Exercise per Week: 0 days    Minutes of Exercise per Session: 0 min  Stress: No Stress Concern Present (02/10/2023)   [employer] of Occupational Health - Occupational Stress Questionnaire    Feeling of Stress : Not at all  Social Connections: Unknown (02/10/2023)   Social Connection and Isolation Panel [NHANES]    Frequency of Communication with Friends and Family: More than three times a week    Frequency of Social Gatherings with Friends and Family: Three times a week    Attends Religious Services: Patient declined    Active Member of Clubs or Organizations: No    Attends Banker Meetings: Never    Marital Status: Married  Catering manager Violence: Not At Risk (02/10/2023)   Humiliation, Afraid, Rape, and Kick  questionnaire    Fear of Current or Ex-Partner: No    Emotionally Abused: No    Physically Abused: No    Sexually Abused: No      PHYSICAL EXAM Generalized: Well developed, in no acute distress   Neurological examination  Mentation: Alert oriented to time, place, history taking. Follows all commands speech and language fluent Cranial nerve II-XII:Extraocular movements were full. Facial symmetry noted.   DIAGNOSTIC DATA (LABS, IMAGING, TESTING) - I reviewed patient records, labs, notes, testing and imaging myself where available.  Lab Results  Component Value Date   WBC 8.0 12/13/2022   HGB 14.0 12/13/2022   HCT 44.4 12/13/2022   MCV 89 12/13/2022   PLT 299 12/13/2022      Component Value Date/Time   NA 141 12/13/2022 1013   K 3.7 12/13/2022 1013   CL 100 12/13/2022 1013   CO2 22 12/13/2022 1013   GLUCOSE 146 (H) 12/13/2022 1013   GLUCOSE 192 (H) 07/07/2020 0645   BUN 18 12/13/2022 1013   CREATININE 1.31 (H) 12/13/2022 1013   CREATININE 0.93 06/02/2012 1007   CALCIUM 9.6 12/13/2022 1013   PROT 6.8 12/13/2022 1013   ALBUMIN 4.1  12/13/2022 1013   AST 18 12/13/2022 1013   ALT 16 12/13/2022 1013   ALKPHOS 78 12/13/2022 1013   BILITOT 0.5 12/13/2022 1013   GFRNONAA >60 07/07/2020 0645   GFRNONAA 82 06/02/2012 1007   GFRAA 85 12/28/2019 0951   GFRAA >89 06/02/2012 1007   Lab Results  Component Value Date   CHOL 124 12/13/2022   HDL 36 (L) 12/13/2022   LDLCALC 58 12/13/2022   TRIG 182 (H) 12/13/2022   CHOLHDL 3.4 12/13/2022   Lab Results  Component Value Date   HGBA1C 7.2 (H) 12/13/2022   Lab Results  Component Value Date   VITAMINB12 1,002 02/28/2018   Lab Results  Component Value Date   TSH 0.930 12/15/2017      ASSESSMENT AND PLAN 83 y.o. year old male  has a past medical history of Actinic keratosis (02/02/2021), Anxiety, Aortic atherosclerosis (HCC) (12/19/2017), Aortic stenosis, Arthritis, Bursitis of hip, Cervical spondylosis, Chronic  headaches, Coronary atherosclerosis of native coronary artery, Essential hypertension, GERD (gastroesophageal reflux disease), History of adenomatous polyp of colon, History of kidney stones, History of MI (myocardial infarction) (12/2004), History of viral meningitis (02/28/2018), Hyperlipidemia, Internal hemorrhoids, Low back pain, Nocturia more than twice per night, OSA (obstructive sleep apnea), Sleep apnea, Spinal stenosis, Type 2 diabetes mellitus treated with insulin (HCC), UTI (urinary tract infection), and Wears dentures. here with:  OSA on CPAP  CPAP compliance excellent Residual AHI is good Encouraged patient to continue using CPAP nightly and > 4 hours each night F/U in 1 year or sooner if needed   Butch Penny, MSN, NP-C 02/11/2023, 9:17 AM [facility] [address], [address] [place], [place] [address] [phone]

## 2023-02-11 ENCOUNTER — Telehealth (INDEPENDENT_AMBULATORY_CARE_PROVIDER_SITE_OTHER): Payer: Medicare Other | Admitting: Adult Health

## 2023-02-11 DIAGNOSIS — G4733 Obstructive sleep apnea (adult) (pediatric): Secondary | ICD-10-CM

## 2023-02-15 ENCOUNTER — Other Ambulatory Visit: Payer: Self-pay | Admitting: *Deleted

## 2023-02-15 DIAGNOSIS — G4733 Obstructive sleep apnea (adult) (pediatric): Secondary | ICD-10-CM

## 2023-02-15 NOTE — Progress Notes (Signed)
Faxed order and OV to [facility] [phone] with fax confirmation received.

## 2023-02-16 DIAGNOSIS — H02833 Dermatochalasis of right eye, unspecified eyelid: Secondary | ICD-10-CM | POA: Diagnosis not present

## 2023-02-16 DIAGNOSIS — H04123 Dry eye syndrome of bilateral lacrimal glands: Secondary | ICD-10-CM | POA: Diagnosis not present

## 2023-02-16 DIAGNOSIS — H02836 Dermatochalasis of left eye, unspecified eyelid: Secondary | ICD-10-CM | POA: Diagnosis not present

## 2023-02-16 DIAGNOSIS — H43813 Vitreous degeneration, bilateral: Secondary | ICD-10-CM | POA: Diagnosis not present

## 2023-02-16 DIAGNOSIS — E113491 Type 2 diabetes mellitus with severe nonproliferative diabetic retinopathy without macular edema, right eye: Secondary | ICD-10-CM | POA: Diagnosis not present

## 2023-02-16 DIAGNOSIS — E113412 Type 2 diabetes mellitus with severe nonproliferative diabetic retinopathy with macular edema, left eye: Secondary | ICD-10-CM | POA: Diagnosis not present

## 2023-03-11 ENCOUNTER — Other Ambulatory Visit: Payer: Self-pay | Admitting: Family Medicine

## 2023-03-14 DIAGNOSIS — H02423 Myogenic ptosis of bilateral eyelids: Secondary | ICD-10-CM | POA: Diagnosis not present

## 2023-03-14 DIAGNOSIS — H02834 Dermatochalasis of left upper eyelid: Secondary | ICD-10-CM | POA: Diagnosis not present

## 2023-03-14 DIAGNOSIS — H02135 Senile ectropion of left lower eyelid: Secondary | ICD-10-CM | POA: Diagnosis not present

## 2023-03-14 DIAGNOSIS — H04123 Dry eye syndrome of bilateral lacrimal glands: Secondary | ICD-10-CM | POA: Diagnosis not present

## 2023-03-14 DIAGNOSIS — H02535 Eyelid retraction left lower eyelid: Secondary | ICD-10-CM | POA: Diagnosis not present

## 2023-03-14 DIAGNOSIS — H02831 Dermatochalasis of right upper eyelid: Secondary | ICD-10-CM | POA: Diagnosis not present

## 2023-03-14 DIAGNOSIS — H02132 Senile ectropion of right lower eyelid: Secondary | ICD-10-CM | POA: Diagnosis not present

## 2023-03-14 DIAGNOSIS — H02532 Eyelid retraction right lower eyelid: Secondary | ICD-10-CM | POA: Diagnosis not present

## 2023-03-14 DIAGNOSIS — H01021 Squamous blepharitis right upper eyelid: Secondary | ICD-10-CM | POA: Diagnosis not present

## 2023-03-15 MED ORDER — BASAGLAR KWIKPEN 100 UNIT/ML ~~LOC~~ SOPN: 56.0000 [IU] | PEN_INJECTOR | Freq: Every day | SUBCUTANEOUS | 5 refills | Status: DC

## 2023-03-15 MED ORDER — INSULIN LISPRO (1 UNIT DIAL) 100 UNIT/ML (KWIKPEN): 20.0000 [IU] | PEN_INJECTOR | Freq: Three times a day (TID) | SUBCUTANEOUS | 11 refills | Status: DC

## 2023-03-15 NOTE — Telephone Encounter (Signed)
 Orders sent to [facility]

## 2023-03-16 ENCOUNTER — Encounter: Payer: Self-pay | Admitting: Family Medicine

## 2023-03-16 ENCOUNTER — Ambulatory Visit (INDEPENDENT_AMBULATORY_CARE_PROVIDER_SITE_OTHER): Payer: Medicare Other | Admitting: Family Medicine

## 2023-03-16 VITALS — BP 159/58 | HR 77 | Ht 66.0 in | Wt 207.0 lb

## 2023-03-16 DIAGNOSIS — R809 Proteinuria, unspecified: Secondary | ICD-10-CM | POA: Diagnosis not present

## 2023-03-16 DIAGNOSIS — E1129 Type 2 diabetes mellitus with other diabetic kidney complication: Secondary | ICD-10-CM | POA: Diagnosis not present

## 2023-03-16 DIAGNOSIS — Z23 Encounter for immunization: Secondary | ICD-10-CM

## 2023-03-16 DIAGNOSIS — I2510 Atherosclerotic heart disease of native coronary artery without angina pectoris: Secondary | ICD-10-CM

## 2023-03-16 DIAGNOSIS — E785 Hyperlipidemia, unspecified: Secondary | ICD-10-CM | POA: Diagnosis not present

## 2023-03-16 DIAGNOSIS — I152 Hypertension secondary to endocrine disorders: Secondary | ICD-10-CM | POA: Diagnosis not present

## 2023-03-16 DIAGNOSIS — Z794 Long term (current) use of insulin: Secondary | ICD-10-CM

## 2023-03-16 DIAGNOSIS — E1169 Type 2 diabetes mellitus with other specified complication: Secondary | ICD-10-CM

## 2023-03-16 DIAGNOSIS — E1159 Type 2 diabetes mellitus with other circulatory complications: Secondary | ICD-10-CM

## 2023-03-16 DIAGNOSIS — E1140 Type 2 diabetes mellitus with diabetic neuropathy, unspecified: Secondary | ICD-10-CM | POA: Diagnosis not present

## 2023-03-16 LAB — BAYER DCA HB A1C WAIVED: HB A1C (BAYER DCA - WAIVED): 7.2 % — ABNORMAL HIGH (ref 4.8–5.6)

## 2023-03-16 MED ORDER — BUTALBITAL-APAP-CAFFEINE 50-325-40 MG PO TABS: 1.0000 | ORAL_TABLET | Freq: Two times a day (BID) | ORAL | 0 refills | Status: DC | PRN

## 2023-03-16 MED ORDER — INSULIN LISPRO (1 UNIT DIAL) 100 UNIT/ML (KWIKPEN): 20.0000 [IU] | PEN_INJECTOR | Freq: Three times a day (TID) | SUBCUTANEOUS | 11 refills | Status: DC

## 2023-03-16 MED ORDER — ATORVASTATIN CALCIUM 40 MG PO TABS: 40.0000 mg | ORAL_TABLET | Freq: Every evening | ORAL | 2 refills | Status: DC

## 2023-03-16 MED ORDER — BASAGLAR KWIKPEN 100 UNIT/ML ~~LOC~~ SOPN: 56.0000 [IU] | PEN_INJECTOR | Freq: Every day | SUBCUTANEOUS | 5 refills | Status: DC

## 2023-03-16 NOTE — Progress Notes (Addendum)
 BP (!) 159/58   Pulse 77   Ht 5\' 6"  (1.676 m)   Wt 207 lb (93.9 kg)   SpO2 96%   BMI 33.41 kg/m    Subjective:   Patient ID: [name], male    DOB: 03-30-1940, 83 y.o.   MRN: [id number]  HPI: [name] is a 83 y.o. male presenting on 03/16/2023 for Medical Management of Chronic Issues, Diabetes, Hyperlipidemia, Hypertension, and feet pain   HPI Type 2 diabetes mellitus Patient comes in today for recheck of his diabetes. Patient has been currently taking Basaglar and Humalog and metformin and Ozempic. Patient is not currently on an ACE inhibitor/ARB. Patient has seen an ophthalmologist this year. Patient denies any new issues with their feet. The symptom started onset as an adult microalbuminuria and hypertension and hyperlipidemia and neuropathy ARE RELATED TO DM   Hypertension Patient is currently on carvedilol and amlodipine and chlorthalidone and Imdur, and their blood pressure today is 159/58, looks like it runs about that level or in the 140s at home, allowing permissive hypertension because of his high risk for falls at this point, we will continue to monitor about 50% in the 140s and about 50% in the 150s. Patient denies any lightheadedness or dizziness. Patient denies headaches, blurred vision, chest pains, shortness of breath, or weakness. Denies any side effects from medication and is content with current medication.   Hyperlipidemia Patient is coming in for recheck of his hyperlipidemia. The patient is currently taking fish oils, says he has been intolerant to statins. They deny any issues with myalgias or history of liver damage from it. They deny any focal numbness or weakness or chest pain.   Relevant past medical, surgical, family and social history reviewed and updated as indicated. Interim medical history since our last visit reviewed. Allergies and medications reviewed and updated.  Review of Systems  Constitutional:  Negative for chills and fever.   Eyes:  Negative for visual disturbance.  Respiratory:  Negative for shortness of breath and wheezing.   Cardiovascular:  Negative for chest pain and leg swelling.  Musculoskeletal:  Negative for back pain and gait problem.  Skin:  Negative for rash.  Neurological:  Negative for dizziness, weakness and light-headedness.  All other systems reviewed and are negative.   Per HPI unless specifically indicated above   Allergies as of 03/16/2023       Reactions   Ace Inhibitors Hives, Swelling, Rash   Rash,hives,tongue swelling   Angiotensin Receptor Blockers    Unknown reaction    Oxycodone-acetaminophen Other (See Comments)   Unknown reaction   Robaxin [methocarbamol] Other (See Comments)   Confusion    Toradol [ketorolac Tromethamine] Other (See Comments)   confusion   Valium Other (See Comments)   Hallucinations; confusion   Doxycycline Hives, Rash        Medication List        Accurate as of March 16, 2023  8:37 AM. If you have any questions, ask your nurse or doctor.          STOP taking these medications    gabapentin 100 MG capsule Commonly known as: NEURONTIN Stopped by: [name]       TAKE these medications    acetaminophen 650 MG CR tablet Commonly known as: TYLENOL Take 1,300 mg by mouth 2 (two) times daily.   amLODipine 10 MG tablet Commonly known as: NORVASC Take 1 tablet (10 mg total) by mouth daily.   aspirin EC  81 MG tablet Take 81 mg by mouth every other day.   atorvastatin 40 MG tablet Commonly known as: LIPITOR Take 1 tablet (40 mg total) by mouth every evening.   Basaglar KwikPen 100 UNIT/ML Inject 56 Units into the skin at bedtime. What changed: Another medication with the same name was added. Make sure you understand how and when to take each. Changed by: [name]   Basaglar KwikPen 100 UNIT/ML Inject 56 Units into the skin at bedtime. What changed: You were already taking a medication with the same name, and  this prescription was added. Make sure you understand how and when to take each. Changed by: [name]   butalbital-acetaminophen-caffeine 231 860 3172 MG tablet Commonly known as: FIORICET Take 1 tablet by mouth 2 (two) times daily as needed for headache.   carvedilol 6.25 MG tablet Commonly known as: COREG Take 1 tablet (6.25 mg total) by mouth 2 (two) times daily with a meal.   cetirizine 10 MG tablet Commonly known as: ZYRTEC Take 10 mg by mouth daily with supper.   chlorthalidone 25 MG tablet Commonly known as: HYGROTON TAKE 1 TABLET BY MOUTH DAILY   cyanocobalamin 1000 MCG tablet Commonly known as: VITAMIN B12 Take 2,000 mcg by mouth daily.   Dexcom G7 Receiver Devi Use to test blood sugar continuously.  DX: E11.65 patient to use good rx card; receiver only   Dexcom G7 Sensor Misc by Does not apply route. APPLY SENSOR TO ARM EVERY 10 DAYS. SENT TO [facility]   docusate sodium 100 MG capsule Commonly known as: COLACE Take 100 mg by mouth daily as needed for mild constipation or moderate constipation.   donepezil 5 MG tablet Commonly known as: ARICEPT TAKE 1 TABLET BY MOUTH AT  BEDTIME   famotidine 20 MG tablet Commonly known as: PEPCID TAKE 1 TABLET BY MOUTH TWICE  DAILY WITH MEALS   Fish Oil 1200 MG Caps Take 1,200-2,400 mg by mouth See admin instructions. Tale 1200 mg in the morning and 2400 mg in the evening   hydrocortisone 2.5 % cream Apply topically as directed. Qd aa forehead and posterior neck prn itching   insulin lispro 100 UNIT/ML KwikPen Commonly known as: HumaLOG KwikPen Inject 20 Units into the skin 3 (three) times daily with meals. What changed: Another medication with the same name was added. Make sure you understand how and when to take each. Changed by: [name]   insulin lispro 100 UNIT/ML KwikPen Commonly known as: HumaLOG KwikPen Inject 20 Units into the skin 3 (three) times daily with meals. What  changed: You were already taking a medication with the same name, and this prescription was added. Make sure you understand how and when to take each. Changed by: [name]   isosorbide mononitrate 30 MG 24 hr tablet Commonly known as: IMDUR TAKE 1 TABLET BY MOUTH IN THE  EVENING   metFORMIN 500 MG 24 hr tablet Commonly known as: GLUCOPHAGE-XR Take 2 tablets (1,000 mg total) by mouth 2 (two) times daily with a meal.   nitroGLYCERIN 0.4 MG SL tablet Commonly known as: NITROSTAT DISSOLVE ONE TABLET UNDER THE TONGUE EVERY 5 MINUTES AS NEEDED FOR CHEST PAIN.  DO NOT EXCEED A TOTAL OF 3 DOSES IN 15 MINUTES   OneTouch Ultra test strip Generic drug: glucose blood Use to test blood sugar 4 times daily as directed. Dx E11.59   PARoxetine 20 MG tablet Commonly known as: PAXIL Take 1 tablet (20 mg  total) by mouth daily.   Semaglutide (2 MG/DOSE) 8 MG/3ML Sopn Inject 2 mg as directed once a week.   tamsulosin 0.4 MG Caps capsule Commonly known as: FLOMAX Take 1 capsule (0.4 mg total) by mouth daily.   Turmeric Curcumin 500 MG Caps Take 500 mg by mouth daily.   Vitamin D3 50 MCG (2000 UT) capsule Take 4,000 Units by mouth daily.         Objective:   BP (!) 159/58   Pulse 77   Ht 5\' 6"  (1.676 m)   Wt 207 lb (93.9 kg)   SpO2 96%   BMI 33.41 kg/m   Wt Readings from Last 3 Encounters:  03/16/23 207 lb (93.9 kg)  02/10/23 228 lb (103.4 kg)  12/30/22 228 lb 3.2 oz (103.5 kg)    Physical Exam Vitals and nursing note reviewed.  Constitutional:      General: He is not in acute distress.    Appearance: He is well-developed. He is not diaphoretic.  Eyes:     General: No scleral icterus.    Conjunctiva/sclera: Conjunctivae normal.  Neck:     Thyroid: No thyromegaly.  Cardiovascular:     Rate and Rhythm: Normal rate and regular rhythm.     Heart sounds: Normal heart sounds. No murmur heard. Pulmonary:     Effort: Pulmonary effort is normal. No respiratory distress.      Breath sounds: Normal breath sounds. No wheezing.  Musculoskeletal:        General: No swelling. Normal range of motion.     Cervical back: Neck supple.  Lymphadenopathy:     Cervical: No cervical adenopathy.  Skin:    General: Skin is warm and dry.     Findings: No rash.  Neurological:     Mental Status: He is alert and oriented to person, place, and time.     Coordination: Coordination normal.  Psychiatric:        Behavior: Behavior normal.       Assessment & Plan:   Problem List Items Addressed This Visit       Cardiovascular and Mediastinum   Hypertension associated with diabetes (HCC)   Relevant Medications   atorvastatin (LIPITOR) 40 MG tablet   Insulin Glargine (BASAGLAR KWIKPEN) 100 UNIT/ML   insulin lispro (HUMALOG KWIKPEN) 100 UNIT/ML KwikPen   CORONARY ATHEROSCLEROSIS NATIVE CORONARY ARTERY   Relevant Medications   atorvastatin (LIPITOR) 40 MG tablet   Type 2 diabetes mellitus with circulatory disorder (HCC) - Primary   Relevant Medications   atorvastatin (LIPITOR) 40 MG tablet   Insulin Glargine (BASAGLAR KWIKPEN) 100 UNIT/ML   insulin lispro (HUMALOG KWIKPEN) 100 UNIT/ML KwikPen   Other Relevant Orders   Bayer DCA Hb A1c Waived     Endocrine   Hyperlipidemia associated with type 2 diabetes mellitus (HCC)   Relevant Medications   atorvastatin (LIPITOR) 40 MG tablet   Insulin Glargine (BASAGLAR KWIKPEN) 100 UNIT/ML   insulin lispro (HUMALOG KWIKPEN) 100 UNIT/ML KwikPen   Microalbuminuria due to type 2 diabetes mellitus (HCC)   Relevant Medications   atorvastatin (LIPITOR) 40 MG tablet   Insulin Glargine (BASAGLAR KWIKPEN) 100 UNIT/ML   insulin lispro (HUMALOG KWIKPEN) 100 UNIT/ML KwikPen   Type 2 diabetes mellitus with diabetic neuropathy, with long-term current use of insulin (HCC)   Relevant Medications   atorvastatin (LIPITOR) 40 MG tablet   Insulin Glargine (BASAGLAR KWIKPEN) 100 UNIT/ML   insulin lispro (HUMALOG KWIKPEN) 100 UNIT/ML KwikPen     A1c was 7.2.  BP slightly elevated, no changes  This office visit was a visit to discuss patient's diabetic management and because she is out of control and using insulin 4 times daily and having to check her blood sugars 4 times daily I believe she would be a good candidate for a continuous subcutaneous glucose monitor such as freestyle libre.  Follow up plan: Return in about 3 months (around 06/16/2023), or if symptoms worsen or fail to improve, for Diabetes hypertension and cholesterol.  Counseling provided for all of the vaccine components Orders Placed This Encounter  Procedures   Bayer DCA Hb A1c Waived    Arville Care, MD [facility] 03/16/2023, 8:37 AM

## 2023-03-17 ENCOUNTER — Telehealth: Payer: Self-pay | Admitting: *Deleted

## 2023-03-17 MED ORDER — LANTUS SOLOSTAR 100 UNIT/ML ~~LOC~~ SOPN: 56.0000 [IU] | PEN_INJECTOR | Freq: Every day | SUBCUTANEOUS | 5 refills | Status: DC

## 2023-03-17 NOTE — Addendum Note (Signed)
 Addended by: Arville Care on: 03/17/2023 02:55 PM   Modules accepted: Orders

## 2023-03-17 NOTE — Telephone Encounter (Signed)
 Switch patient to Lantus because of insurance

## 2023-03-17 NOTE — Telephone Encounter (Signed)
 Fax from [employer] RE: Insulin Glargin Psychologist, sport and exercise) Note: Insurance prefers Lantus Please advise

## 2023-03-17 NOTE — Progress Notes (Signed)
 Pharmacy Medication Assistance Program Note    03/17/2023  Patient ID: [name], male  DOB: 1940-05-18, 83 y.o.  MRN:  [id number]     01/28/2023  Outreach Medication Two  Manufacturer Medication Two [employer] Basaglar  Type of Radiographer, therapeutic Assistance  Date Application Sent to Patient 02/01/2023  Application Items Requested Application  Date Application Sent to Prescriber 03/04/2023  Date Application Received From Patient 03/01/2023  Method Application Sent to Manufacturer Fax  Date Application Submitted to Manufacturer 03/15/2023  Patient Assistance Determination Approved  Approval Start Date 03/16/2023     Signature   03/17/2023  Patient ID: [name], male  DOB: 04-15-1940, 83 y.o.  MRN:  [id number]     01/28/2023  Outreach Medication Three  Manufacturer Medication Three [facility] Humalog  Type of Assistance Manufacturer Assistance  Date Application Sent to Patient 02/01/2023  Application Items Requested Application  Date Application Sent to Prescriber 03/04/2023  Date Application Received From Patient 03/01/2023  Application Items Received From Patient Application;Proof of Income  Date Application Submitted to Manufacturer 03/15/2023  Method Application Sent to Manufacturer Fax  Patient Assistance Determination Approved  Approval Start Date 03/15/2023  Approval End Date 01/11/2024     Renewal approved

## 2023-03-22 ENCOUNTER — Telehealth: Payer: Self-pay | Admitting: Family Medicine

## 2023-03-22 NOTE — Telephone Encounter (Signed)
 Copied from CRM [id number]. Topic: General - Call Back - No Documentation >> Mar 22, 2023  2:11 PM Higinio Roger wrote: Reason for CRM: Patient's wife is requesting a callback to discuss a letter she received stating [employer] are unable to approve patient getting Semaglutide, 2 MG/DOSE, 8 MG/3ML SOPN due to missing information. She is wondering if patient can get samples until this can be straightened out.  Callback #: [phone]

## 2023-03-23 NOTE — Telephone Encounter (Signed)
 Refaxed page 8 to company due to signature being too light.

## 2023-03-24 ENCOUNTER — Ambulatory Visit: Payer: Medicare Other | Admitting: Adult Health

## 2023-03-24 NOTE — Telephone Encounter (Signed)
 Left message informing patient all missing info has been sent to [employer] and to keep an eye out for the approval letter. Also informed that company has shipping delays and they can call them directly to get a 30 day voucher once approved. Suggested they give [place] a call back once this is done so medication can be sent to their pharmacy.

## 2023-03-29 ENCOUNTER — Telehealth: Payer: Self-pay

## 2023-03-29 NOTE — Telephone Encounter (Signed)
 Copied from CRM [id number]. Topic: General - Call Back - No Documentation >> Mar 22, 2023  2:11 PM Higinio Roger wrote: Reason for CRM: Patient's wife is requesting a callback to discuss a letter she received stating [employer] are unable to approve patient getting Semaglutide, 2 MG/DOSE, 8 MG/3ML SOPN due to missing information. She is wondering if patient can get samples until this can be straightened out.  Callback #: [phone] >> Mar 29, 2023  9:30 AM Fuller Mandril wrote: Patient wife called back stating she reached out to [facility] and they stated the paperwork they received was incomplete. It was missing pages 7-9. Would need documentation faxed back with completed pages 7-9 in order to assist patient. Thank You  >> Mar 29, 2023  9:05 AM Geroge Baseman wrote: Patients wife calling to check on the status of this. CAL advised the meds have been put through they are just experiencing shipping delays. She relayed that the patient can call the [employer] number and get a 30 day voucher, info was relayed to the patients wife and she understood. She is going to call them and then call back to get his voucher put through to the pharmacy.

## 2023-03-31 ENCOUNTER — Other Ambulatory Visit: Payer: Self-pay

## 2023-03-31 ENCOUNTER — Telehealth: Payer: Self-pay | Admitting: Family Medicine

## 2023-03-31 DIAGNOSIS — Z794 Long term (current) use of insulin: Secondary | ICD-10-CM

## 2023-03-31 MED ORDER — DEXCOM G7 RECEIVER DEVI: 0 refills | Status: AC

## 2023-03-31 NOTE — Telephone Encounter (Signed)
 Pts wife called stating that she needs PCP to send in Dexcom to [facility] because pt is almost out. Only has 3 days left before he will have to start sticking his finger and doesn't want to do that.   Please advise.

## 2023-03-31 NOTE — Telephone Encounter (Signed)
 Spoke with pt confirmed mail service , sent med in

## 2023-04-01 ENCOUNTER — Other Ambulatory Visit: Payer: Self-pay

## 2023-04-01 ENCOUNTER — Telehealth: Payer: Self-pay

## 2023-04-01 ENCOUNTER — Telehealth: Payer: Self-pay | Admitting: Family Medicine

## 2023-04-01 MED ORDER — DEXCOM G7 SENSOR MISC: 3 refills | Status: AC

## 2023-04-01 MED ORDER — DEXCOM G7 SENSOR MISC: 1 refills | Status: DC

## 2023-04-01 NOTE — Telephone Encounter (Signed)
 Copied from CRM [id number]. Topic: Clinical - Medication Question >> Apr 01, 2023 11:53 AM Nyra Capes wrote: Reason for CRM: Patient's wife calling stating that paperwork for a refill on his Dexcom 7 was faxed over from [place]. She states that patient only has 2 days remaining before he is out. He needs to replace this on Sunday. Contacted and transferred to CAL.  Wife stated he does have old glucose to check sugar.  Wife phone # [phone] ok to leave detailed message

## 2023-04-01 NOTE — Addendum Note (Signed)
 Addended by: Dorene Sorrow on: 04/01/2023 04:02 PM   Modules accepted: Orders

## 2023-04-01 NOTE — Telephone Encounter (Signed)
Refer to other phone message  

## 2023-04-01 NOTE — Telephone Encounter (Signed)
 Wife spoke with company. Says they still couldn't read the signatures.   Refaxed all provider pages again. & uploaded newer copies to media.

## 2023-04-01 NOTE — Telephone Encounter (Signed)
 Copied from CRM [id number]. Topic: Clinical - Medication Question >> Apr 01, 2023 12:12 PM Nyra Capes wrote: Reason for CRM: Patient's wife calling stating that paperwork for a refill on his Dexcom 7 was faxed over from [place]. [employer] requires the last office visit and new order. Wife Phone ph # [phone] okay to leave detailed message.

## 2023-04-01 NOTE — Telephone Encounter (Signed)
 Spoke with wife. Forms are on Dr. Darrol Poke desk. Printed last OV note. Need Dr. Louanne Skye to sign Dexcom order

## 2023-04-05 ENCOUNTER — Ambulatory Visit: Payer: Medicare Other | Admitting: Dermatology

## 2023-04-05 DIAGNOSIS — L821 Other seborrheic keratosis: Secondary | ICD-10-CM

## 2023-04-05 DIAGNOSIS — L570 Actinic keratosis: Secondary | ICD-10-CM | POA: Diagnosis not present

## 2023-04-05 DIAGNOSIS — Z1283 Encounter for screening for malignant neoplasm of skin: Secondary | ICD-10-CM

## 2023-04-05 DIAGNOSIS — L719 Rosacea, unspecified: Secondary | ICD-10-CM

## 2023-04-05 DIAGNOSIS — D1801 Hemangioma of skin and subcutaneous tissue: Secondary | ICD-10-CM

## 2023-04-05 DIAGNOSIS — L820 Inflamed seborrheic keratosis: Secondary | ICD-10-CM

## 2023-04-05 DIAGNOSIS — W908XXA Exposure to other nonionizing radiation, initial encounter: Secondary | ICD-10-CM | POA: Diagnosis not present

## 2023-04-05 DIAGNOSIS — L578 Other skin changes due to chronic exposure to nonionizing radiation: Secondary | ICD-10-CM | POA: Diagnosis not present

## 2023-04-05 DIAGNOSIS — L918 Other hypertrophic disorders of the skin: Secondary | ICD-10-CM

## 2023-04-05 DIAGNOSIS — R202 Paresthesia of skin: Secondary | ICD-10-CM

## 2023-04-05 DIAGNOSIS — L853 Xerosis cutis: Secondary | ICD-10-CM

## 2023-04-05 DIAGNOSIS — L814 Other melanin hyperpigmentation: Secondary | ICD-10-CM | POA: Diagnosis not present

## 2023-04-05 DIAGNOSIS — D229 Melanocytic nevi, unspecified: Secondary | ICD-10-CM

## 2023-04-05 DIAGNOSIS — L720 Epidermal cyst: Secondary | ICD-10-CM | POA: Diagnosis not present

## 2023-04-05 DIAGNOSIS — D692 Other nonthrombocytopenic purpura: Secondary | ICD-10-CM

## 2023-04-05 DIAGNOSIS — L729 Follicular cyst of the skin and subcutaneous tissue, unspecified: Secondary | ICD-10-CM

## 2023-04-05 DIAGNOSIS — L219 Seborrheic dermatitis, unspecified: Secondary | ICD-10-CM

## 2023-04-05 MED ORDER — HYDROCORTISONE 2.5 % EX CREA: TOPICAL_CREAM | CUTANEOUS | 11 refills | Status: AC

## 2023-04-05 NOTE — Patient Instructions (Addendum)
 Seborrheic Keratosis  What causes seborrheic keratoses? Seborrheic keratoses are harmless, common skin growths that first appear during adult life.  As time goes by, more growths appear.  Some people may develop a large number of them.  Seborrheic keratoses appear on both covered and uncovered body parts.  They are not caused by sunlight.  The tendency to develop seborrheic keratoses can be inherited.  They vary in color from skin-colored to gray, brown, or even black.  They can be either smooth or have a rough, warty surface.   Seborrheic keratoses are superficial and look as if they were stuck on the skin.  Under the microscope this type of keratosis looks like layers upon layers of skin.  That is why at times the top layer may seem to fall off, but the rest of the growth remains and re-grows.    Treatment Seborrheic keratoses do not need to be treated, but can easily be removed in the office.  Seborrheic keratoses often cause symptoms when they rub on clothing or jewelry.  Lesions can be in the way of shaving.  If they become inflamed, they can cause itching, soreness, or burning.  Removal of a seborrheic keratosis can be accomplished by freezing, burning, or surgery. If any spot bleeds, scabs, or grows rapidly, please return to have it checked, as these can be an indication of a skin cancer.   Gentle Skin Care Guide  1. Bathe no more than once a day.  2. Avoid bathing in hot water  3. Use a mild soap like Dove, Vanicream, Cetaphil, CeraVe. Can use Lever 2000 or Cetaphil antibacterial soap  4. Use soap only where you need it. On most days, use it under your arms, between your legs, and on your feet. Let the water rinse other areas unless visibly dirty.  5. When you get out of the bath/shower, use a towel to gently blot your skin dry, don't rub it.  6. While your skin is still a little damp, apply a moisturizing cream such as Vanicream, CeraVe, Cetaphil, Eucerin, Sarna lotion or plain  Vaseline Jelly. For hands apply Neutrogena [place] Hand Cream or Excipial Hand Cream.  7. Reapply moisturizer any time you start to itch or feel dry.  8. Sometimes using free and clear laundry detergents can be helpful. Fabric softener sheets should be avoided. Downy Free & Gentle liquid, or any liquid fabric softener that is free of dyes and perfumes, it acceptable to use  9. If your doctor has given you prescription creams you may apply moisturizers over them      Due to recent changes in healthcare laws, you may see results of your pathology and/or laboratory studies on MyChart before the doctors have had a chance to review them. We understand that in some cases there may be results that are confusing or concerning to you. Please understand that not all results are received at the same time and often the doctors may need to interpret multiple results in order to provide you with the best plan of care or course of treatment. Therefore, we ask that you please give [place] 2 business days to thoroughly review all your results before contacting the office for clarification. Should we see a critical lab result, you will be contacted sooner.   If You Need Anything After Your Visit  If you have any questions or concerns for your doctor, please call our main line at [phone] and press option 4 to reach your doctor's medical assistant. If no  one answers, please leave a voicemail as directed and we will return your call as soon as possible. Messages left after 4 pm will be answered the following business day.   You may also send [place] a message via MyChart. We typically respond to MyChart messages within 1-2 business days.  For prescription refills, please ask your pharmacy to contact our office. Our fax number is [phone].  If you have an urgent issue when the clinic is closed that cannot wait until the next business day, you can page your doctor at the number below.    Please note that while we  do our best to be available for urgent issues outside of office hours, we are not available 24/7.   If you have an urgent issue and are unable to reach [place], you may choose to seek medical care at your doctor's office, retail clinic, urgent care center, or emergency room.  If you have a medical emergency, please immediately call 911 or go to the emergency department.  Pager Numbers  - Dr. Gwen Pounds: [phone]  - Dr. Roseanne Reno: [phone]  - Dr. Katrinka Blazing: [phone]   In the event of inclement weather, please call our main line at [phone] for an update on the status of any delays or closures.  Dermatology Medication Tips: Please keep the boxes that topical medications come in in order to help keep track of the instructions about where and how to use these. Pharmacies typically print the medication instructions only on the boxes and not directly on the medication tubes.   If your medication is too expensive, please contact our office at [phone] option 4 or send [place] a message through MyChart.   We are unable to tell what your co-pay for medications will be in advance as this is different depending on your insurance coverage. However, we may be able to find a substitute medication at lower cost or fill out paperwork to get insurance to cover a needed medication.   If a prior authorization is required to get your medication covered by your insurance company, please allow [place] 1-2 business days to complete this process.  Drug prices often vary depending on where the prescription is filled and some pharmacies may offer cheaper prices.  The website www.goodrx.com contains coupons for medications through different pharmacies. The prices here do not account for what the cost may be with help from insurance (it may be cheaper with your insurance), but the website can give you the price if you did not use any insurance.  - You can print the associated coupon and take it with your prescription  to the pharmacy.  - You may also stop by our office during regular business hours and pick up a GoodRx coupon card.  - If you need your prescription sent electronically to a different pharmacy, notify our office through [facility] or by phone at [phone] option 4.     Si Usted Necesita Algo Despus de Su Visita  Tambin puede enviarnos un mensaje a travs de Clinical cytogeneticist. Por lo general respondemos a los mensajes de MyChart en el transcurso de 1 a 2 das hbiles.  Para renovar recetas, por favor pida a su farmacia que se ponga en contacto con nuestra oficina. Annie Sable de fax es [place] [phone].  Si tiene un asunto urgente cuando la clnica est cerrada y que no puede esperar hasta el siguiente da hbil, puede llamar/localizar a su doctor(a) al nmero que aparece a continuacin.   Por favor, tenga en  cuenta que aunque hacemos todo lo posible para estar disponibles para asuntos urgentes fuera del horario de [place], no estamos disponibles las 24 horas del da, los 7 [address]  [address] de la [place].   Si tiene un problema urgente y no puede comunicarse con nosotros, puede optar por buscar atencin mdica  en el consultorio de su doctor(a), en una clnica privada, en un centro de atencin urgente o en una sala de emergencias.  Si tiene Engineer, drilling, por favor llame inmediatamente al 911 o vaya a la sala de emergencias.  Nmeros de bper  - Dr. Gwen Pounds: [phone]  - Dra. Roseanne Reno: [phone]  - Dr. Katrinka Blazing: [phone]   En caso de inclemencias del tiempo, por favor llame a Lacy Duverney principal al [phone] para una actualizacin sobre el [place] de cualquier retraso o cierre.  Consejos para la medicacin en dermatologa: Por favor, guarde las cajas en las que vienen los medicamentos de uso tpico para ayudarle a seguir las instrucciones sobre dnde y cmo usarlos. Las farmacias generalmente imprimen las instrucciones del medicamento slo en las cajas y no directamente  en los tubos del [place].   Si su medicamento es muy caro, por favor, pngase en contacto con Rolm Gala llamando al [phone] y presione la opcin 4 o envenos un mensaje a travs de Clinical cytogeneticist.   No podemos decirle cul ser su copago por los medicamentos por adelantado ya que esto es diferente dependiendo de la cobertura de su seguro. Sin embargo, es posible que podamos encontrar un medicamento sustituto a Audiological scientist un formulario para que el seguro cubra el medicamento que se considera necesario.   Si se requiere una autorizacin previa para que su compaa de seguros [place] su medicamento, por favor permtanos de 1 a 2 das hbiles para completar [address]  Los precios de los medicamentos varan con frecuencia dependiendo del Environmental consultant de dnde se surte la receta y alguna farmacias pueden ofrecer precios ms baratos.  El sitio web www.goodrx.com tiene cupones para medicamentos de Health and safety inspector. Los precios aqu no tienen en cuenta lo que podra costar con la ayuda del seguro (puede ser ms barato con su seguro), pero el sitio web puede darle el precio si no utiliz Tourist information centre manager.  - Puede imprimir el cupn correspondiente y llevarlo con su receta a la farmacia.  - Tambin puede pasar por nuestra oficina durante el horario de atencin regular y Education officer, museum una tarjeta de cupones de GoodRx.  - Si necesita que su receta se enve electrnicamente a una farmacia diferente, informe a nuestra oficina a travs de MyChart de [place] o por telfono llamando al [phone] y presione la opcin 4.

## 2023-04-05 NOTE — Telephone Encounter (Signed)
 [name] has completed the [employer] form and most recent OV note. Note, Printed Rx for [employer] ad cover sheet faxed back to [place] at [phone] on 04/04/23.

## 2023-04-05 NOTE — Progress Notes (Signed)
 Follow-Up Visit   Subjective  [name] is a 83 y.o. male who presents for the following: Skin Cancer Screening and Full Body Skin Exam  The patient presents for Total-Body Skin Exam (TBSE) for skin cancer screening and mole check. The patient has spots, moles and lesions to be evaluated, some may be new or changing and the patient may have concern these could be cancer. New spot on wrist gets irritated by watch.  The following portions of the chart were reviewed this encounter and updated as appropriate: medications, allergies, medical history  Review of Systems:  No other skin or systemic complaints except as noted in HPI or Assessment and Plan.  Objective  Well appearing patient in no apparent distress; mood and affect are within normal limits.  A full examination was performed including scalp, head, eyes, ears, nose, lips, neck, chest, axillae, abdomen, back, buttocks, bilateral upper extremities, bilateral lower extremities, hands, feet, fingers, toes, fingernails, and toenails. All findings within normal limits unless otherwise noted below.   Relevant physical exam findings are noted in the Assessment and Plan.  L ear helix x 2 Erythematous thin papules/macules with gritty scale.  L wrist x 2 (2) Erythematous stuck-on, waxy papule  Assessment & Plan   SKIN CANCER SCREENING PERFORMED TODAY.  ACTINIC DAMAGE - Chronic condition, secondary to cumulative UV/sun exposure - diffuse scaly erythematous macules with underlying dyspigmentation - Recommend daily broad spectrum sunscreen SPF 30+ to sun-exposed areas, reapply every 2 hours as needed.  - Staying in the shade or wearing long sleeves, sun glasses (UVA+UVB protection) and wide brim hats (4-inch brim around the entire circumference of the hat) are also recommended for sun protection.  - Call for new or changing lesions.  LENTIGINES, SEBORRHEIC KERATOSES, HEMANGIOMAS - Benign normal skin lesions - Benign-appearing -  Call for any changes  MELANOCYTIC NEVI - Tan-brown and/or pink-flesh-colored symmetric macules and papules - Benign appearing on exam today - Observation - Call clinic for new or changing moles - Recommend daily use of broad spectrum spf 30+ sunscreen to sun-exposed areas.   Acrochordons (Skin Tags) - Fleshy, skin-colored pedunculated papules- L lower axilla - Benign appearing.  - Observe. - If desired, they can be removed with an in office procedure that is not covered by insurance. - Please call the clinic if you notice any new or changing lesions.    Notalgia paresthetica L post neck/shoulder: Skin is clear, pt c/o itching   With hx of Nerve pain in this area, Chronic and persistent condition with duration or expected duration over one year. Condition is symptomatic/ bothersome to patient. Not currently at goal.    Notalgia paresthetica is a chronic condition affecting the skin of the back in which a pinched nerve along the spine causes itching or changes in sensation in an area of skin. This is usually accompanied by chronic rubbing or scratching often leaving the area of skin discolored and thickened. There is no cure, but there are some treatments which may help control the itch.   Over the counter (non-prescription) treatments for notalgia paresthetica include numbing creams like pramoxine or lidocaine which temporarily reduce itch or Capsaicin-containing creams which cause a burning sensation but which sometimes over time will reset the nerves to stop producing itch.  If you choose to use Capsaicin cream, it is recommended to use it 5 times daily for 1 week followed by 3 times daily for 3-6 weeks. You may have to continue using it long-term. - If not  doing well with OTC options, could consider [employer] compounded prescription anti-itch cream with Amitriptyline 5% / Lidocaine 5% / Pramoxine 1% or Amitriptyline 5% / Gabapentin 10% / Lidocaine 5% Cream. For severe cases, there  are some prescription cream or pill options which may help. Other treatment options include: - Transcutaneous Electrical Nerve Stimulation (TENS) - Gabapentin 300-900 mg daily po - Amitriptyline orally - Paravertebral local anesthetic block - intralesional Botulinum toxin A   May start HC 2.5% cr every day/bid prn itching   Topical steroids (such as triamcinolone, fluocinolone, fluocinonide, mometasone, clobetasol, halobetasol, betamethasone, hydrocortisone) can cause thinning and lightening of the skin if they are used for too long in the same area. Your physician has selected the right strength medicine for your problem and area affected on the body. Please use your medication only as directed by your physician to prevent side effects.    Seborrheic dermatitis Exam: Clear today   Chronic condition with duration or expected duration over one year. Currently well-controlled.    Seborrheic Dermatitis  -  is a chronic persistent rash characterized by pinkness and scaling most commonly of the mid face but also can occur on the scalp (dandruff), ears; mid chest, mid back and groin.  It tends to be exacerbated by stress and cooler weather.  People who have neurologic disease may experience new onset or exacerbation of existing seborrheic dermatitis.  The condition is not curable but treatable and can be controlled.   Start HC 2.5% cr qd/bid aa forehead prn flares itchy rash  ROSACEA Exam: Pink papules on the L forehead  Chronic and persistent condition with duration or expected duration over one year. Condition is not symptomatic/ bothersome to patient.  Rosacea is a chronic progressive skin condition usually affecting the face of adults, causing redness and/or acne bumps. It is treatable but not curable. It sometimes affects the eyes (ocular rosacea) as well. It may respond to topical and/or systemic medication and can flare with stress, sun exposure, alcohol, exercise, topical steroids  (including hydrocortisone/cortisone 10) and some foods.  Daily application of broad spectrum spf 30+ sunscreen to face is recommended to reduce flares.  Treatment Plan Patient defers treatment today.  EPIDERMAL INCLUSION CYST Exam: Subcutaneous nodule at R neck  Benign-appearing. Exam most consistent with an epidermal inclusion cyst. Discussed that a cyst is a benign growth that can grow over time and sometimes get irritated or inflamed. Recommend observation if it is not bothersome. Discussed option of surgical excision to remove it if it is growing, symptomatic, or other changes noted. Please call for new or changing lesions so they can be evaluated. AK (ACTINIC KERATOSIS) L ear helix x 2 Actinic keratoses are precancerous spots that appear secondary to cumulative UV radiation exposure/sun exposure over time. They are chronic with expected duration over 1 year. A portion of actinic keratoses will progress to squamous cell carcinoma of the skin. It is not possible to reliably predict which spots will progress to skin cancer and so treatment is recommended to prevent development of skin cancer.  Recommend daily broad spectrum sunscreen SPF 30+ to sun-exposed areas, reapply every 2 hours as needed.  Recommend staying in the shade or wearing long sleeves, sun glasses (UVA+UVB protection) and wide brim hats (4-inch brim around the entire circumference of the hat). Call for new or changing lesions.  Destruction of lesion - L ear helix x 2  Destruction method: cryotherapy   Informed consent: discussed and consent obtained   Lesion destroyed using  liquid nitrogen: Yes   Region frozen until ice ball extended beyond lesion: Yes   Outcome: patient tolerated procedure well with no complications   Post-procedure details: wound care instructions given   Additional details:  Prior to procedure, discussed risks of blister formation, small wound, skin dyspigmentation, or rare scar following cryotherapy.  Recommend Vaseline ointment to treated areas while healing.  INFLAMED SEBORRHEIC KERATOSIS (2) L wrist x 2 (2) Symptomatic, irritating, patient would like treated.  Destruction of lesion - L wrist x 2 (2)  Destruction method: cryotherapy   Informed consent: discussed and consent obtained   Lesion destroyed using liquid nitrogen: Yes   Region frozen until ice ball extended beyond lesion: Yes   Outcome: patient tolerated procedure well with no complications   Post-procedure details: wound care instructions given   Additional details:  Prior to procedure, discussed risks of blister formation, small wound, skin dyspigmentation, or rare scar following cryotherapy. Recommend Vaseline ointment to treated areas while healing.   Purpura - Chronic; persistent and recurrent.  Treatable, but not curable. - Violaceous macules and patches - Benign - Related to trauma, age, sun damage and/or use of blood thinners, chronic use of topical and/or oral steroids - Observe - Can use OTC arnica containing moisturizer such as Dermend Bruise Formula if desired - Call for worsening or other concerns  Xerosis - diffuse xerotic patches on the legs - recommend gentle, hydrating skin care, CeraVe Diabetic Cream - gentle skin care handout given  Return in about 1 year (around 04/04/2024) for TBSE.  Maylene Roes, CMA, am acting as scribe for Willeen Niece, MD .   Documentation: I have reviewed the above documentation for accuracy and completeness, and I agree with the above.  Willeen Niece, MD

## 2023-04-07 DIAGNOSIS — E1159 Type 2 diabetes mellitus with other circulatory complications: Secondary | ICD-10-CM | POA: Diagnosis not present

## 2023-04-07 DIAGNOSIS — Z794 Long term (current) use of insulin: Secondary | ICD-10-CM | POA: Diagnosis not present

## 2023-04-07 NOTE — Telephone Encounter (Signed)
 Faxed clear copy of pages 7-9 again on 04/06/23.

## 2023-04-08 ENCOUNTER — Telehealth: Payer: Self-pay | Admitting: Family Medicine

## 2023-04-08 DIAGNOSIS — E1129 Type 2 diabetes mellitus with other diabetic kidney complication: Secondary | ICD-10-CM

## 2023-04-08 DIAGNOSIS — E1159 Type 2 diabetes mellitus with other circulatory complications: Secondary | ICD-10-CM

## 2023-04-08 MED ORDER — SEMAGLUTIDE (2 MG/DOSE) 8 MG/3ML ~~LOC~~ SOPN: 2.0000 mg | PEN_INJECTOR | SUBCUTANEOUS | 0 refills | Status: DC

## 2023-04-08 MED ORDER — METFORMIN HCL ER 500 MG PO TB24: 1000.0000 mg | ORAL_TABLET | Freq: Two times a day (BID) | ORAL | 3 refills | Status: DC

## 2023-04-08 NOTE — Telephone Encounter (Signed)
 Ozempic 30d supply sent to [facility] in [place].

## 2023-04-08 NOTE — Addendum Note (Signed)
 Addended by: Arville Care on: 04/08/2023 04:22 PM   Modules accepted: Orders

## 2023-04-08 NOTE — Addendum Note (Signed)
 Addended by: Dorene Sorrow on: 04/08/2023 04:40 PM   Modules accepted: Orders

## 2023-04-08 NOTE — Telephone Encounter (Signed)
 Wife made aware that Metformin 500mg  2 tabs BID to [facility]. No further concerns.

## 2023-04-08 NOTE — Telephone Encounter (Signed)
 Copied from CRM [id number]. 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 [facility] told [facility] that they couldn't refill the medicine because its too early.According to [facility], on the metFormin it says take 1 tablet by mouth twice a day, but in the patient medicine chart it says take 2 tablets by mouth twice a day. Please contact [facility] by phone at [phone].  Preferred Pharmacy [facility]) - [place], [place] - [address] [facility] [address] [place] [address] [place] [address] Phone: [phone] Fax: [phone]

## 2023-04-08 NOTE — Telephone Encounter (Signed)
 How should patient be taking medication?

## 2023-04-08 NOTE — Telephone Encounter (Signed)
 That is interesting, I do not know why they would have the prescription as 1 pill twice a day because the last time I sent it it does say 2 pills twice a day on the prescription so that is what he should be getting.  I resent the medication as 2 pills twice a day again so he can try and get it but it should be 2 pills twice a day

## 2023-04-11 NOTE — Progress Notes (Signed)
 Pharmacy Medication Assistance Program Note    04/11/2023  Patient ID: [name], male   DOB: 1940/02/08, 83 y.o.   MRN: [id number]     01/28/2023  Outreach Medication One  Manufacturer Medication One [employer] [facility] Ozempic  Type of Assistance Manufacturer Assistance  Date Application Sent to Patient 02/01/2023  Application Items Requested Application;Proof of Income  Date Application Sent to Prescriber 03/04/2023  Date Application Received From Patient 03/01/2023  Application Items Received From Patient Application  Date Application Received From Provider 03/08/2023  Date Application Submitted to Manufacturer 03/15/2023  Method Application Sent to Manufacturer Fax  Patient Assistance Determination Approved  Approval Start Date 04/08/2023  Approval End Date 01/11/2024    Renewal approved

## 2023-04-15 ENCOUNTER — Other Ambulatory Visit: Payer: Self-pay | Admitting: Family Medicine

## 2023-04-27 ENCOUNTER — Telehealth: Payer: Self-pay

## 2023-04-27 NOTE — Telephone Encounter (Signed)
 Called to inform patient that their Ozempic is in office. Line was busy. [facility] 04/16

## 2023-05-09 DIAGNOSIS — H0279 Other degenerative disorders of eyelid and periocular area: Secondary | ICD-10-CM | POA: Diagnosis not present

## 2023-05-09 DIAGNOSIS — H02412 Mechanical ptosis of left eyelid: Secondary | ICD-10-CM | POA: Diagnosis not present

## 2023-05-09 DIAGNOSIS — H57813 Brow ptosis, bilateral: Secondary | ICD-10-CM | POA: Diagnosis not present

## 2023-05-09 DIAGNOSIS — H02834 Dermatochalasis of left upper eyelid: Secondary | ICD-10-CM | POA: Diagnosis not present

## 2023-05-09 DIAGNOSIS — H0259 Other disorders affecting eyelid function: Secondary | ICD-10-CM | POA: Diagnosis not present

## 2023-05-09 DIAGNOSIS — H02132 Senile ectropion of right lower eyelid: Secondary | ICD-10-CM | POA: Diagnosis not present

## 2023-05-09 DIAGNOSIS — H02421 Myogenic ptosis of right eyelid: Secondary | ICD-10-CM | POA: Diagnosis not present

## 2023-05-09 DIAGNOSIS — H02422 Myogenic ptosis of left eyelid: Secondary | ICD-10-CM | POA: Diagnosis not present

## 2023-05-09 DIAGNOSIS — H02423 Myogenic ptosis of bilateral eyelids: Secondary | ICD-10-CM | POA: Diagnosis not present

## 2023-05-09 DIAGNOSIS — H02413 Mechanical ptosis of bilateral eyelids: Secondary | ICD-10-CM | POA: Diagnosis not present

## 2023-05-09 DIAGNOSIS — H02831 Dermatochalasis of right upper eyelid: Secondary | ICD-10-CM | POA: Diagnosis not present

## 2023-06-16 ENCOUNTER — Ambulatory Visit: Admitting: Family Medicine

## 2023-06-16 DIAGNOSIS — G4733 Obstructive sleep apnea (adult) (pediatric): Secondary | ICD-10-CM | POA: Diagnosis not present

## 2023-06-16 DIAGNOSIS — H02833 Dermatochalasis of right eye, unspecified eyelid: Secondary | ICD-10-CM | POA: Diagnosis not present

## 2023-06-16 DIAGNOSIS — H43813 Vitreous degeneration, bilateral: Secondary | ICD-10-CM | POA: Diagnosis not present

## 2023-06-16 DIAGNOSIS — H04123 Dry eye syndrome of bilateral lacrimal glands: Secondary | ICD-10-CM | POA: Diagnosis not present

## 2023-06-16 DIAGNOSIS — E113412 Type 2 diabetes mellitus with severe nonproliferative diabetic retinopathy with macular edema, left eye: Secondary | ICD-10-CM | POA: Diagnosis not present

## 2023-06-16 DIAGNOSIS — H02836 Dermatochalasis of left eye, unspecified eyelid: Secondary | ICD-10-CM | POA: Diagnosis not present

## 2023-06-16 DIAGNOSIS — E113491 Type 2 diabetes mellitus with severe nonproliferative diabetic retinopathy without macular edema, right eye: Secondary | ICD-10-CM | POA: Diagnosis not present

## 2023-06-16 LAB — HM DIABETES EYE EXAM

## 2023-06-23 ENCOUNTER — Ambulatory Visit: Admitting: Family Medicine

## 2023-06-23 VITALS — BP 129/65 | HR 66 | Temp 98.1°F | Ht 66.0 in | Wt 223.2 lb

## 2023-06-23 DIAGNOSIS — E1140 Type 2 diabetes mellitus with diabetic neuropathy, unspecified: Secondary | ICD-10-CM

## 2023-06-23 DIAGNOSIS — E1169 Type 2 diabetes mellitus with other specified complication: Secondary | ICD-10-CM | POA: Diagnosis not present

## 2023-06-23 DIAGNOSIS — R809 Proteinuria, unspecified: Secondary | ICD-10-CM | POA: Diagnosis not present

## 2023-06-23 DIAGNOSIS — I2510 Atherosclerotic heart disease of native coronary artery without angina pectoris: Secondary | ICD-10-CM

## 2023-06-23 DIAGNOSIS — Z794 Long term (current) use of insulin: Secondary | ICD-10-CM

## 2023-06-23 DIAGNOSIS — E785 Hyperlipidemia, unspecified: Secondary | ICD-10-CM

## 2023-06-23 DIAGNOSIS — E1159 Type 2 diabetes mellitus with other circulatory complications: Secondary | ICD-10-CM | POA: Diagnosis not present

## 2023-06-23 DIAGNOSIS — I152 Hypertension secondary to endocrine disorders: Secondary | ICD-10-CM | POA: Diagnosis not present

## 2023-06-23 DIAGNOSIS — E1129 Type 2 diabetes mellitus with other diabetic kidney complication: Secondary | ICD-10-CM | POA: Diagnosis not present

## 2023-06-23 LAB — CBC WITH DIFFERENTIAL/PLATELET

## 2023-06-23 LAB — BAYER DCA HB A1C WAIVED: HB A1C (BAYER DCA - WAIVED): 6.7 % — ABNORMAL HIGH (ref 4.8–5.6)

## 2023-06-23 LAB — LIPID PANEL

## 2023-06-23 NOTE — Progress Notes (Signed)
 BP 129/65   Pulse 66   Temp 98.1 F (36.7 C) (Temporal)   Ht 5' 6 (1.676 m)   Wt 223 lb 3.2 oz (101.2 kg)   SpO2 95%   BMI 36.03 kg/m    Subjective:   Patient ID: [name], male    DOB: Dec 03, 1940, 83 y.o.   MRN: [id number]  HPI: [name] is a 83 y.o. male presenting on 06/23/2023 for Medical Management of Chronic Issues (3 month) and Fatigue (Been going on for years worse in the last year)   HPI Type 2 diabetes mellitus Patient comes in today for recheck of his diabetes. Patient has been currently taking Lantus  and Humalog  and metformin  and Ozempic . Patient is not currently on an ACE inhibitor/ARB. Patient has not seen an ophthalmologist this year. Patient has worsening burning and neuropathy in his feet. The symptom started onset as an adult CAD and hypertension and hyperlipidemia and microalbuminuria and neuropathy ARE RELATED TO DM   Hypertension Patient is currently on amlodipine  and chlorthalidone  and Imdur , and their blood pressure today is 129/65. Patient denies any lightheadedness or dizziness. Patient denies headaches, blurred vision, chest pains, shortness of breath, or weakness. Denies any side effects from medication and is content with current medication.   Hyperlipidemia and CAD Patient is coming in for recheck of his hyperlipidemia. The patient is currently taking atorvastatin  and fish oils. They deny any issues with myalgias or history of liver damage from it. They deny any focal numbness or weakness or chest pain.   Patient has chronic fatigue and he says he is just not getting better.  He says his energy has just been more down.  He says has not been anything recent but just been happening more over the past year.  He says he still does use a sleep apnea machine.  He has been seeing cardiology for management.  He does have a little more arthritis is noticed and not moving as much but he still tries to get out in his garden.  Relevant past medical,  surgical, family and social history reviewed and updated as indicated. Interim medical history since our last visit reviewed. Allergies and medications reviewed and updated.  Review of Systems  Constitutional:  Positive for fatigue. Negative for chills and fever.  Eyes:  Negative for visual disturbance.  Respiratory:  Negative for shortness of breath and wheezing.   Cardiovascular:  Negative for chest pain and leg swelling.  Musculoskeletal:  Negative for back pain and gait problem.  Skin:  Negative for rash.  Neurological:  Negative for dizziness and numbness.  All other systems reviewed and are negative.   Per HPI unless specifically indicated above   Allergies as of 06/23/2023       Reactions   Ace Inhibitors Hives, Swelling, Rash   Rash,hives,tongue swelling   Angiotensin Receptor Blockers    Unknown reaction    Oxycodone-acetaminophen  Other (See Comments)   Unknown reaction   Robaxin [methocarbamol] Other (See Comments)   Confusion    Toradol  [ketorolac  Tromethamine ] Other (See Comments)   confusion   Valium Other (See Comments)   Hallucinations; confusion   Doxycycline Hives, Rash        Medication List        Accurate as of June 23, 2023 10:01 AM. If you have any questions, ask your nurse or doctor.          acetaminophen  650 MG CR tablet Commonly known as: TYLENOL  Take 1,300 mg by  mouth 2 (two) times daily.   amLODipine  10 MG tablet Commonly known as: NORVASC  Take 1 tablet (10 mg total) by mouth daily.   aspirin  EC 81 MG tablet Take 81 mg by mouth every other day.   atorvastatin  40 MG tablet Commonly known as: LIPITOR  Take 1 tablet (40 mg total) by mouth every evening.   butalbital -acetaminophen -caffeine  50-325-40 MG tablet Commonly known as: FIORICET  Take 1 tablet by mouth 2 (two) times daily as needed for headache.   cetirizine 10 MG tablet Commonly known as: ZYRTEC Take 10 mg by mouth daily with supper.   chlorthalidone  25 MG  tablet Commonly known as: HYGROTON  TAKE 1 TABLET BY MOUTH DAILY   cyanocobalamin  1000 MCG tablet Commonly known as: VITAMIN B12 Take 2,000 mcg by mouth daily.   Dexcom G7 Receiver Devi Use to test blood sugar continuously.  DX: E11.65 patient to use good rx card; receiver only   Dexcom G7 Sensor Misc DX: E11.9   APPLY SENSOR TO ARM EVERY 10 DAYS.   docusate sodium  100 MG capsule Commonly known as: COLACE Take 100 mg by mouth daily as needed for mild constipation or moderate constipation.   donepezil  5 MG tablet Commonly known as: ARICEPT  TAKE 1 TABLET BY MOUTH AT  BEDTIME   famotidine  20 MG tablet Commonly known as: PEPCID  TAKE 1 TABLET BY MOUTH TWICE  DAILY WITH MEALS   Fish Oil 1200 MG Caps Take 1,200-2,400 mg by mouth See admin instructions. Tale 1200 mg in the morning and 2400 mg in the evening   hydrocortisone  2.5 % cream Apply topically as directed. Qd aa forehead and posterior neck prn itching   insulin  lispro 100 UNIT/ML KwikPen Commonly known as: HumaLOG  KwikPen Inject 20 Units into the skin 3 (three) times daily with meals.   insulin  lispro 100 UNIT/ML KwikPen Commonly known as: HumaLOG  KwikPen Inject 20 Units into the skin 3 (three) times daily with meals.   isosorbide  mononitrate 30 MG 24 hr tablet Commonly known as: IMDUR  TAKE 1 TABLET BY MOUTH IN THE  EVENING   Lantus  SoloStar 100 UNIT/ML Solostar Pen Generic drug: insulin  glargine Inject 56 Units into the skin at bedtime.   metFORMIN  500 MG 24 hr tablet Commonly known as: GLUCOPHAGE -XR Take 2 tablets (1,000 mg total) by mouth 2 (two) times daily with a meal.   nitroGLYCERIN  0.4 MG SL tablet Commonly known as: NITROSTAT  DISSOLVE ONE TABLET UNDER THE TONGUE EVERY 5 MINUTES AS NEEDED FOR CHEST PAIN.  DO NOT EXCEED A TOTAL OF 3 DOSES IN 15 MINUTES   OneTouch Ultra test strip Generic drug: glucose blood Use to test blood sugar 4 times daily as directed. Dx E11.59   PARoxetine  20 MG  tablet Commonly known as: PAXIL  Take 1 tablet (20 mg total) by mouth daily.   Semaglutide  (2 MG/DOSE) 8 MG/3ML Sopn Inject 2 mg as directed once a week.   tamsulosin  0.4 MG Caps capsule Commonly known as: FLOMAX  Take 1 capsule (0.4 mg total) by mouth daily.   Turmeric Curcumin 500 MG Caps Take 500 mg by mouth daily.   Vitamin D3 50 MCG (2000 UT) capsule Take 4,000 Units by mouth daily.         Objective:   BP 129/65   Pulse 66   Temp 98.1 F (36.7 C) (Temporal)   Ht 5' 6 (1.676 m)   Wt 223 lb 3.2 oz (101.2 kg)   SpO2 95%   BMI 36.03 kg/m   Wt Readings from Last 3 Encounters:  06/23/23 223 lb 3.2 oz (101.2 kg)  03/16/23 207 lb (93.9 kg)  02/10/23 228 lb (103.4 kg)    Physical Exam Vitals and nursing note reviewed.  Constitutional:      General: He is not in acute distress.    Appearance: He is well-developed. He is not diaphoretic.   Eyes:     General: No scleral icterus.    Conjunctiva/sclera: Conjunctivae normal.   Neck:     Thyroid : No thyromegaly.   Cardiovascular:     Rate and Rhythm: Normal rate and regular rhythm.     Heart sounds: Normal heart sounds. No murmur heard. Pulmonary:     Effort: Pulmonary effort is normal. No respiratory distress.     Breath sounds: Normal breath sounds. No wheezing.   Musculoskeletal:        General: No swelling. Normal range of motion.     Cervical back: Neck supple.  Lymphadenopathy:     Cervical: No cervical adenopathy.   Skin:    General: Skin is warm and dry.     Findings: No rash.   Neurological:     Mental Status: He is alert and oriented to person, place, and time.     Coordination: Coordination normal.   Psychiatric:        Behavior: Behavior normal.     Results for orders placed or performed in visit on 06/16/23  HM DIABETES EYE EXAM   Collection Time: 06/16/23 12:16 PM  Result Value Ref Range   HM Diabetic Eye Exam Retinopathy (A) No Retinopathy    Assessment & Plan:   Problem List  Items Addressed This Visit       Cardiovascular and Mediastinum   Hypertension associated with diabetes (HCC) - Primary   Relevant Orders   CBC with Differential/Platelet   CMP14+EGFR   CORONARY ATHEROSCLEROSIS NATIVE CORONARY ARTERY   Type 2 diabetes mellitus with circulatory disorder (HCC)   Relevant Orders   Bayer DCA Hb A1c Waived     Endocrine   Hyperlipidemia associated with type 2 diabetes mellitus (HCC)   Relevant Orders   Lipid panel   Microalbuminuria due to type 2 diabetes mellitus (HCC)   Type 2 diabetes mellitus with diabetic neuropathy, with long-term current use of insulin  (HCC)    Blood pressure and A1c look good, 6.7 today.  No change in medication, likely chronic fatigue is due to decreased activity and multiple chronic illnesses throughout his life.  Will check blood work. Follow up plan: Return in about 3 months (around 09/23/2023), or if symptoms worsen or fail to improve, for Diabetes recheck.  Counseling provided for all of the vaccine components Orders Placed This Encounter  Procedures   Bayer DCA Hb A1c Waived   CBC with Differential/Platelet   CMP14+EGFR   Lipid panel    Jolyne Needs, MD [facility] 06/23/2023, 10:01 AM

## 2023-06-24 ENCOUNTER — Other Ambulatory Visit: Payer: Self-pay | Admitting: Family Medicine

## 2023-06-24 ENCOUNTER — Other Ambulatory Visit: Payer: Self-pay | Admitting: Cardiology

## 2023-06-24 LAB — CMP14+EGFR
ALT: 22 IU/L (ref 0–44)
AST: 20 IU/L (ref 0–40)
Albumin: 3.9 g/dL (ref 3.7–4.7)
Alkaline Phosphatase: 76 IU/L (ref 44–121)
BUN/Creatinine Ratio: 18 (ref 10–24)
BUN: 20 mg/dL (ref 8–27)
Bilirubin Total: 0.4 mg/dL (ref 0.0–1.2)
CO2: 18 mmol/L — ABNORMAL LOW (ref 20–29)
Calcium: 9.1 mg/dL (ref 8.6–10.2)
Chloride: 100 mmol/L (ref 96–106)
Creatinine, Ser: 1.14 mg/dL (ref 0.76–1.27)
Globulin, Total: 2.7 g/dL (ref 1.5–4.5)
Glucose: 144 mg/dL — ABNORMAL HIGH (ref 70–99)
Potassium: 4 mmol/L (ref 3.5–5.2)
Sodium: 138 mmol/L (ref 134–144)
Total Protein: 6.6 g/dL (ref 6.0–8.5)
eGFR: 64 mL/min/{1.73_m2} (ref >59)

## 2023-06-24 LAB — CBC WITH DIFFERENTIAL/PLATELET
Basophils Absolute: 0.1 10*3/uL (ref 0.0–0.2)
Basos: 1 %
EOS (ABSOLUTE): 0.2 10*3/uL (ref 0.0–0.4)
Eos: 2 %
Hematocrit: 41.7 % (ref 37.5–51.0)
Hemoglobin: 13.3 g/dL (ref 13.0–17.7)
Immature Grans (Abs): 0 10*3/uL (ref 0.0–0.1)
Immature Granulocytes: 0 %
Lymphocytes Absolute: 2.2 10*3/uL (ref 0.7–3.1)
Lymphs: 27 %
MCH: 28.5 pg (ref 26.6–33.0)
MCHC: 31.9 g/dL (ref 31.5–35.7)
MCV: 90 fL (ref 79–97)
Monocytes Absolute: 0.6 10*3/uL (ref 0.1–0.9)
Monocytes: 7 %
Neutrophils Absolute: 5 10*3/uL (ref 1.4–7.0)
Neutrophils: 63 %
Platelets: 298 10*3/uL (ref 150–450)
RBC: 4.66 x10E6/uL (ref 4.14–5.80)
RDW: 14.4 % (ref 11.6–15.4)
WBC: 7.9 10*3/uL (ref 3.4–10.8)

## 2023-06-24 LAB — LIPID PANEL
Chol/HDL Ratio: 3.4 ratio (ref 0.0–5.0)
Cholesterol, Total: 98 mg/dL — ABNORMAL LOW (ref 100–199)
HDL: 29 mg/dL — ABNORMAL LOW (ref >39)
LDL Chol Calc (NIH): 41 mg/dL (ref 0–99)
Triglycerides: 165 mg/dL — ABNORMAL HIGH (ref 0–149)
VLDL Cholesterol Cal: 28 mg/dL (ref 5–40)

## 2023-07-01 ENCOUNTER — Ambulatory Visit: Payer: Self-pay | Admitting: Family Medicine

## 2023-07-26 DIAGNOSIS — E113292 Type 2 diabetes mellitus with mild nonproliferative diabetic retinopathy without macular edema, left eye: Secondary | ICD-10-CM | POA: Diagnosis not present

## 2023-07-26 DIAGNOSIS — H02423 Myogenic ptosis of bilateral eyelids: Secondary | ICD-10-CM | POA: Diagnosis not present

## 2023-07-26 DIAGNOSIS — H02135 Senile ectropion of left lower eyelid: Secondary | ICD-10-CM | POA: Diagnosis not present

## 2023-07-26 DIAGNOSIS — H02132 Senile ectropion of right lower eyelid: Secondary | ICD-10-CM | POA: Diagnosis not present

## 2023-07-26 DIAGNOSIS — Z794 Long term (current) use of insulin: Secondary | ICD-10-CM | POA: Diagnosis not present

## 2023-07-26 DIAGNOSIS — E119 Type 2 diabetes mellitus without complications: Secondary | ICD-10-CM | POA: Diagnosis not present

## 2023-07-28 ENCOUNTER — Telehealth: Payer: Self-pay | Admitting: Cardiology

## 2023-07-28 ENCOUNTER — Telehealth: Payer: Self-pay

## 2023-07-28 NOTE — Telephone Encounter (Signed)
 Received 2nd request.  Pt is on Aspirin . Called surgeons office to see if they are requesting for the pharmacy clearance as well. No answer office is closed for lunch will try back.

## 2023-07-28 NOTE — Telephone Encounter (Signed)
 Spoke with [name] at [facility] and got the following information.  [facility] does need Pharmacy clearance and medical clearance.  Surgeons office needs instructions if the pt needs to hold Aspirin   Anesthesia type: MAC  Will update Preop App.

## 2023-07-28 NOTE — Telephone Encounter (Signed)
 S/W spouse and scheduled TELE Preop appt 07/29/23 approved by [facility]. Med Rec and consent done.  Pt spouse added about holding aspirin . Spouse stated that pt last took aspirin  07/26/23 at night time.   Informed pt spouse per Lum Louis, NP note the following: Regarding ASA therapy, we recommend continuation of ASA throughout the perioperative period.   Pt spouse stated that with that she will have pt take aspirin  tonight (07/28/23) and going forward will go by if instructed of anything different during TELE Preop appt 07/29/23.

## 2023-07-28 NOTE — Telephone Encounter (Signed)
 Med rec and consent done.    Patient Consent for Virtual Visit        [name] has provided verbal consent on 07/28/2023 for a virtual visit (video or telephone).   CONSENT FOR VIRTUAL VISIT FOR:  [name]  By participating in this virtual visit I agree to the following:  I hereby voluntarily request, consent and authorize [facility] and its employed or contracted physicians, physician assistants, nurse practitioners or other licensed health care professionals (the Practitioner), to provide me with telemedicine health care services (the "Services) as deemed necessary by the treating Practitioner. I acknowledge and consent to receive the Services by the Practitioner via telemedicine. I understand that the telemedicine visit will involve communicating with the Practitioner through live audiovisual communication technology and the disclosure of certain medical information by electronic transmission. I acknowledge that I have been given the opportunity to request an in-person assessment or other available alternative prior to the telemedicine visit and am voluntarily participating in the telemedicine visit.  I understand that I have the right to withhold or withdraw my consent to the use of telemedicine in the course of my care at any time, without affecting my right to future care or treatment, and that the Practitioner or I may terminate the telemedicine visit at any time. I understand that I have the right to inspect all information obtained and/or recorded in the course of the telemedicine visit and may receive copies of available information for a reasonable fee.  I understand that some of the potential risks of receiving the Services via telemedicine include:  Delay or interruption in medical evaluation due to technological equipment failure or disruption; Information transmitted may not be sufficient (e.g. poor resolution of images) to allow for appropriate medical  decision making by the Practitioner; and/or  In rare instances, security protocols could fail, causing a breach of personal health information.  Furthermore, I acknowledge that it is my responsibility to provide information about my medical history, conditions and care that is complete and accurate to the best of my ability. I acknowledge that Practitioner's advice, recommendations, and/or decision may be based on factors not within their control, such as incomplete or inaccurate data provided by me or distortions of diagnostic images or specimens that may result from electronic transmissions. I understand that the practice of medicine is not an exact science and that Practitioner makes no warranties or guarantees regarding treatment outcomes. I acknowledge that a copy of this consent can be made available to me via my patient portal [facility] MyChart), or I can request a printed copy by calling the office of [facility].    I understand that my insurance will be billed for this visit.   I have read or had this consent read to me. I understand the contents of this consent, which adequately explains the benefits and risks of the Services being provided via telemedicine.  I have been provided ample opportunity to ask questions regarding this consent and the Services and have had my questions answered to my satisfaction. I give my informed consent for the services to be provided through the use of telemedicine in my medical care

## 2023-07-28 NOTE — Telephone Encounter (Signed)
   Name: [name]  DOB: 12/07/1940  MRN: [id number]  Primary Cardiologist: Jayson Sierras, MD   Preoperative team, please contact this patient and set up a phone call appointment for further preoperative risk assessment. Please obtain consent and complete medication review. Thank you for your help.  I confirm that guidance regarding antiplatelet and oral anticoagulation therapy has been completed and, if necessary, noted below.  Regarding ASA therapy, we recommend continuation of ASA throughout the perioperative period.  I also confirmed the patient resides in the state of [place] . As per [place] [employer] telemedicine laws, the patient must reside in the state in which the provider is licensed.   Lum LITTIE Louis, NP 07/28/2023, 3:30 PM  [facility]

## 2023-07-28 NOTE — Telephone Encounter (Signed)
   Pre-operative Risk Assessment    Patient Name: [name]  DOB: 1940-12-16 MRN: [id number]      Request for Surgical Clearance    Procedure:  Bilateral upper eyelid blepharoptosis repair   Date of Surgery:  Clearance 08/01/23                                 Surgeon:  Dr. Renzo Zaldivar Surgeon's Group or Practice Name:  [facility] Phone number:  [phone] Fax number:  [phone]   Type of Clearance Requested:   - Medical    Type of Anesthesia:  Not Indicated   Additional requests/questions:    Bonney Andres ONEIDA Delbra   07/28/2023, 12:13 PM

## 2023-07-29 ENCOUNTER — Ambulatory Visit: Attending: Cardiology | Admitting: Emergency Medicine

## 2023-07-29 DIAGNOSIS — Z01810 Encounter for preprocedural cardiovascular examination: Secondary | ICD-10-CM | POA: Diagnosis not present

## 2023-07-29 NOTE — Telephone Encounter (Signed)
 Caller Casey) is following-up on the status of patient's clearance.

## 2023-07-29 NOTE — Progress Notes (Signed)
 Virtual Visit via Telephone Note   Because of Nazier R Shappell co-morbid illnesses, he is at least at moderate risk for complications without adequate follow up.  This format is felt to be most appropriate for this patient at this time.  Due to technical limitations with video connection (technology), today's appointment will be conducted as an audio only telehealth visit, and [name] verbally agreed to proceed in this manner.   All issues noted in this document were discussed and addressed.  No physical exam could be performed with this format.  Evaluation Performed:  Preoperative cardiovascular risk assessment _____________   Date:  07/29/2023   Patient ID:  [name], DOB 25-Dec-1940, MRN [id number] Patient Location:  Home Provider location:   Office  Primary Care Provider:  Dettinger, Fonda LABOR, MD Primary Cardiologist:  Jayson Sierras, MD  Chief Complaint / Patient Profile   83 y.o. y/o male with a h/o severe degenerative calcific aortic stenosis status post TAVR in 2021, coronary artery disease s/p BMS, hyperlipidemia, hypertension who is pending bilateral upper eyelid blepharoptosis repair on 08/01/2023 with [employer] and presents today for telephonic preoperative cardiovascular risk assessment.  History of Present Illness    [name] is a 83 y.o. male who presents via audio/video conferencing for a telehealth visit today.  Pt was last seen in cardiology clinic on 12/30/2022 by Dr. Sierras.  At that time [name] was doing well.  The patient is now pending procedure as outlined above. Since his last visit, he denies chest pain, shortness of breath, lower extremity edema, fatigue, palpitations, melena, hematuria, hemoptysis, diaphoresis, weakness, presyncope, syncope, orthopnea, and PND.  Today patient is doing well overall.  He is without acute cardiovascular concerns or complaints at this time.  He denies any chest pain, dyspnea, or  other exertional symptoms.  Tells me he feels great.  He is a very active 83 year old.  He hunts regularly, just recently remodeled a log cabin, does yard work and housework without any limitations.  Overall he is able to complete greater than 4 METS without exertional symptoms.  Past Medical History    Past Medical History:  Diagnosis Date   Actinic keratosis 02/02/2021   R upper back   Anxiety    Aortic atherosclerosis (HCC) 12/19/2017   Aortic stenosis    26 mm Edwards S3U THV November 2021   Arthritis    Bursitis of hip    Cervical spondylosis    Chronic headaches    Coronary atherosclerosis of native coronary artery    BMS nondominant RCA 12/2004, DES x2 to mid LAD 11/2019   Essential hypertension    GERD (gastroesophageal reflux disease)    History of adenomatous polyp of colon    History of kidney stones    History of MI (myocardial infarction) 12/2004   History of viral meningitis 02/28/2018   December 2019   Hyperlipidemia    Internal hemorrhoids    Low back pain    Nocturia more than twice per night    OSA (obstructive sleep apnea)    Sleep apnea    Spinal stenosis    Type 2 diabetes mellitus treated with insulin  (HCC)    UTI (urinary tract infection)    Wears dentures    Upper   Past Surgical History:  Procedure Laterality Date   CARDIAC CATHETERIZATION  06-14-2004  dr morris   total occlusion mD1, RI 30-40%, mRCA nondominant hazy 80% and scattered 30-40%,  ef 71% (medical mangement)  CARDIAC VALVE REPLACEMENT     CARDIOVASCULAR STRESS TEST  09-17-2015   dr debera   Low risk nuclear study w/ reversible mild small anteroapical defect,  normal LV function and wall motion, nuclear stress ef 61%   CATARACT EXTRACTION W/ INTRAOCULAR LENS IMPLANT Right 07/2014   COLONOSCOPY  09/10/2008   normal   CORONARY ANGIOPLASTY WITH STENT PLACEMENT  12-17-2004   dr benismhon/ dr obie   nonobstructive cad LAD and LCFx,  BMS x1 to total occlusion RCA    CORONARY  ATHERECTOMY  11/22/2019   CORONARY ATHERECTOMY N/A 11/22/2019   Procedure: CORONARY ATHERECTOMY;  Surgeon: Verlin Lonni BIRCH, MD;  Location: [facility] INVASIVE CV LAB;  Service: Cardiovascular;  Laterality: N/A;   CYSTOSCOPY W/ URETEROSCOPY W/ LITHOTRIPSY  08/2010   ERCP  03/14/2008   EYE SURGERY Bilateral    cataracts   JOINT REPLACEMENT     LAPAROSCOPIC CHOLECYSTECTOMY  05/2012   LEFT HEART CATHETERIZATION WITH CORONARY ANGIOGRAM N/A 05/14/2011   Procedure: LEFT HEART CATHETERIZATION WITH CORONARY ANGIOGRAM;  Surgeon: Ozell Fell, MD;  Location: [facility] CATH LAB;  Service: Cardiovascular;  Laterality: N/A;    non-obstructive LM, LAD, LCFx, patent RCA stent   LUMBAR LAMINECTOMY/DECOMPRESSION MICRODISCECTOMY N/A 02/24/2017   Procedure: Microlumbar decompression L4-5, L5-S1;  Surgeon: Duwayne Purchase, MD;  Location: [facility] ORS;  Service: Orthopedics;  Laterality: N/A;  120 mins   RIGHT/LEFT HEART CATH AND CORONARY ANGIOGRAPHY N/A 11/14/2019   Procedure: RIGHT/LEFT HEART CATH AND CORONARY ANGIOGRAPHY;  Surgeon: Verlin Lonni BIRCH, MD;  Location: [facility] INVASIVE CV LAB;  Service: Cardiovascular;  Laterality: N/A;   ROTATOR CUFF REPAIR Right 03/2002   ROTATOR CUFF REPAIR Left 12/11/2015   TEE WITHOUT CARDIOVERSION N/A 12/04/2019   Procedure: TRANSESOPHAGEAL ECHOCARDIOGRAM (TEE);  Surgeon: Verlin Lonni BIRCH, MD;  Location: [facility] INVASIVE CV LAB;  Service: Open Heart Surgery;  Laterality: N/A;   TOTAL HIP ARTHROPLASTY Right 12/20/2008   TOTAL HIP ARTHROPLASTY Left 07/05/2018   Procedure: TOTAL HIP ARTHROPLASTY ANTERIOR APPROACH;  Surgeon: Melodi Lerner, MD;  Location: [facility] ORS;  Service: Orthopedics;  Laterality: Left;    TOTAL KNEE ARTHROPLASTY Bilateral left 12-11-2003/  right 07-31-2004   dr melodi [facility]   TRANSCATHETER AORTIC VALVE REPLACEMENT, TRANSFEMORAL N/A 12/04/2019   Procedure: TRANSCATHETER AORTIC VALVE REPLACEMENT, TRANSFEMORAL;  Surgeon: Verlin Lonni BIRCH, MD;  Location: [facility]  INVASIVE CV LAB;  Service: Open Heart Surgery;  Laterality: N/A;   UPPER GASTROINTESTINAL ENDOSCOPY  01/08/2008   bx, inlet patch, duodenitis   YAG LASER APPLICATION Right 07/29/2014   Procedure: YAG LASER APPLICATION;  Surgeon: Dow JULIANNA Burke, MD;  Location: [facility] ORS;  Service: Ophthalmology;  Laterality: Right;    Allergies  Allergies  Allergen Reactions   Ace Inhibitors Hives, Swelling and Rash    Rash,hives,tongue swelling   Angiotensin Receptor Blockers     Unknown reaction    Oxycodone-Acetaminophen  Other (See Comments)    Unknown reaction   Robaxin [Methocarbamol] Other (See Comments)    Confusion    Toradol  [Ketorolac  Tromethamine ] Other (See Comments)    confusion   Valium Other (See Comments)    Hallucinations; confusion   Doxycycline Hives and Rash    Home Medications    Prior to Admission medications   Medication Sig Start Date End Date Taking? Authorizing Provider  acetaminophen  (TYLENOL ) 650 MG CR tablet Take 1,300 mg by mouth 2 (two) times daily.    [provider]  amLODipine  (NORVASC ) 10 MG tablet Take 1 tablet (10 mg total) by mouth daily.  12/13/22   Dettinger, Fonda LABOR, MD  aspirin  EC 81 MG tablet Take 81 mg by mouth every other day.    [provider]  atorvastatin  (LIPITOR ) 40 MG tablet TAKE 1 TABLET BY MOUTH IN THE  EVENING 06/24/23   Debera Jayson MATSU, MD  butalbital -acetaminophen -caffeine  (FIORICET ) 50-325-40 MG tablet Take 1 tablet by mouth 2 (two) times daily as needed for headache. 03/16/23   Dettinger, Fonda LABOR, MD  cetirizine (ZYRTEC) 10 MG tablet Take 10 mg by mouth daily with supper.     [provider]  chlorthalidone  (HYGROTON ) 25 MG tablet TAKE 1 TABLET BY MOUTH DAILY 06/24/23   Debera Jayson MATSU, MD  Cholecalciferol (VITAMIN D3) 50 MCG (2000 UT) capsule Take 4,000 Units by mouth daily.  Patient not taking: Reported on 07/28/2023    [provider]  Continuous Glucose Receiver (DEXCOM G7 RECEIVER) DEVI Use to  test blood sugar continuously.  DX: E11.65 patient to use good rx card; receiver only 03/31/23   Dettinger, Fonda LABOR, MD  Continuous Glucose Sensor (DEXCOM G7 SENSOR) MISC DX: E11.9   APPLY SENSOR TO ARM EVERY 10 DAYS. 04/01/23   Dettinger, Fonda LABOR, MD  docusate sodium  (COLACE) 100 MG capsule Take 100 mg by mouth daily as needed for mild constipation or moderate constipation.     [provider]  donepezil  (ARICEPT ) 5 MG tablet TAKE 1 TABLET BY MOUTH AT  BEDTIME 06/24/23   Dettinger, Fonda LABOR, MD  famotidine  (PEPCID ) 20 MG tablet TAKE 1 TABLET BY MOUTH TWICE  DAILY WITH MEALS 04/15/23   Dettinger, Fonda LABOR, MD  glucose blood (ONETOUCH ULTRA) test strip Use to test blood sugar 4 times daily as directed. Dx E11.59 09/02/21   Dettinger, Fonda LABOR, MD  hydrocortisone  2.5 % cream Apply topically as directed. Qd aa forehead and posterior neck prn itching 04/05/23   Jackquline Sawyer, MD  insulin  glargine (LANTUS  SOLOSTAR) 100 UNIT/ML Solostar Pen Inject 56 Units into the skin at bedtime. 03/17/23   Dettinger, Fonda LABOR, MD  insulin  lispro (HUMALOG  KWIKPEN) 100 UNIT/ML KwikPen Inject 20 Units into the skin 3 (three) times daily with meals. 03/16/23   Dettinger, Fonda LABOR, MD  insulin  lispro (HUMALOG  KWIKPEN) 100 UNIT/ML KwikPen Inject 20 Units into the skin 3 (three) times daily with meals. 03/15/23   Dettinger, Fonda LABOR, MD  isosorbide  mononitrate (IMDUR ) 30 MG 24 hr tablet TAKE 1 TABLET BY MOUTH IN THE  EVENING 05/31/22   Debera Jayson MATSU, MD  metFORMIN  (GLUCOPHAGE -XR) 500 MG 24 hr tablet Take 2 tablets (1,000 mg total) by mouth 2 (two) times daily with a meal. 04/08/23   Dettinger, Fonda LABOR, MD  nitroGLYCERIN  (NITROSTAT ) 0.4 MG SL tablet DISSOLVE ONE TABLET UNDER THE TONGUE EVERY 5 MINUTES AS NEEDED FOR CHEST PAIN.  DO NOT EXCEED A TOTAL OF 3 DOSES IN 15 MINUTES 12/17/21   Debera Jayson MATSU, MD  Omega-3 Fatty Acids (FISH OIL) 1200 MG CAPS Take 1,200-2,400 mg by mouth See admin instructions. Tale 1200 mg in the  morning and 2400 mg in the evening Patient not taking: Reported on 07/28/2023    [provider]  PARoxetine  (PAXIL ) 20 MG tablet TAKE 1 TABLET BY MOUTH DAILY 06/24/23   Dettinger, Fonda LABOR, MD  Semaglutide , 2 MG/DOSE, 8 MG/3ML SOPN Inject 2 mg as directed once a week. 04/08/23   Dettinger, Fonda LABOR, MD  tamsulosin  (FLOMAX ) 0.4 MG CAPS capsule TAKE 1 CAPSULE BY MOUTH DAILY 06/24/23   Dettinger, Fonda LABOR, MD  Turmeric Curcumin 500 MG CAPS Take 500 mg by mouth daily. Patient not taking: Reported on 07/28/2023    [provider]  vitamin B-12 (CYANOCOBALAMIN ) 1000 MCG tablet Take 2,000 mcg by mouth daily. Patient not taking: Reported on 07/28/2023    [provider]    Physical Exam    Vital Signs:  [name] does not have vital signs available for review today.  Given telephonic nature of communication, physical exam is limited. AAOx3. NAD. Normal affect.  Speech and respirations are unlabored.  Accessory Clinical Findings    None  Assessment & Plan    1.  Preoperative Cardiovascular Risk Assessment: According to the Revised Cardiac Risk Index (RCRI), his Perioperative Risk of Major Cardiac Event is (%): 0.9. His Functional Capacity in METs is: 5.62 according to the Duke Activity Status Index (DASI). Therefore, based on ACC/AHA guidelines, patient would be at acceptable risk for the planned procedure without further cardiovascular testing.  The patient was advised that if he develops new symptoms prior to surgery to contact our office to arrange for a follow-up visit, and he verbalized understanding.  Ideally aspirin  should be continued without interruption, however if the bleeding risk is too great, aspirin  may be held for 5-7 days prior to surgery. Please resume aspirin  post operatively when it is felt to be safe from a bleeding standpoint.    A copy of this note will be routed to requesting surgeon.  Time:   Today, I have spent 10 minutes with the  patient with telehealth technology discussing medical history, symptoms, and management plan.     [name] LITTIE Louis, NP  07/29/2023, 4:10 PM

## 2023-07-29 NOTE — Telephone Encounter (Signed)
 Attempted to return call to inform surgeon's office that the pt Is scheduled for preop tele appt today at 4pm. Didn't receive an answer, stated that office was closed.

## 2023-08-01 ENCOUNTER — Other Ambulatory Visit: Payer: Self-pay | Admitting: Cardiology

## 2023-08-01 DIAGNOSIS — H02132 Senile ectropion of right lower eyelid: Secondary | ICD-10-CM | POA: Diagnosis not present

## 2023-08-01 DIAGNOSIS — H02834 Dermatochalasis of left upper eyelid: Secondary | ICD-10-CM | POA: Diagnosis not present

## 2023-08-01 DIAGNOSIS — H02421 Myogenic ptosis of right eyelid: Secondary | ICD-10-CM | POA: Diagnosis not present

## 2023-08-01 DIAGNOSIS — H0259 Other disorders affecting eyelid function: Secondary | ICD-10-CM | POA: Diagnosis not present

## 2023-08-01 DIAGNOSIS — H02422 Myogenic ptosis of left eyelid: Secondary | ICD-10-CM | POA: Diagnosis not present

## 2023-08-01 DIAGNOSIS — H02413 Mechanical ptosis of bilateral eyelids: Secondary | ICD-10-CM | POA: Diagnosis not present

## 2023-08-01 DIAGNOSIS — H57813 Brow ptosis, bilateral: Secondary | ICD-10-CM | POA: Diagnosis not present

## 2023-08-01 DIAGNOSIS — H02831 Dermatochalasis of right upper eyelid: Secondary | ICD-10-CM | POA: Diagnosis not present

## 2023-08-01 DIAGNOSIS — H02423 Myogenic ptosis of bilateral eyelids: Secondary | ICD-10-CM | POA: Diagnosis not present

## 2023-08-01 DIAGNOSIS — H02412 Mechanical ptosis of left eyelid: Secondary | ICD-10-CM | POA: Diagnosis not present

## 2023-08-15 ENCOUNTER — Encounter: Payer: Self-pay | Admitting: *Deleted

## 2023-08-15 NOTE — Progress Notes (Unsigned)
 PATIENTS WIFE AWARE THAT OZEMPIC  IS AVAILABLE FOR PICK UP AT OFFICE

## 2023-09-02 DIAGNOSIS — H0279 Other degenerative disorders of eyelid and periocular area: Secondary | ICD-10-CM | POA: Diagnosis not present

## 2023-09-02 DIAGNOSIS — H0102B Squamous blepharitis left eye, upper and lower eyelids: Secondary | ICD-10-CM | POA: Diagnosis not present

## 2023-09-02 DIAGNOSIS — H0288A Meibomian gland dysfunction right eye, upper and lower eyelids: Secondary | ICD-10-CM | POA: Diagnosis not present

## 2023-09-02 DIAGNOSIS — H16223 Keratoconjunctivitis sicca, not specified as Sjogren's, bilateral: Secondary | ICD-10-CM | POA: Diagnosis not present

## 2023-09-02 DIAGNOSIS — H0102A Squamous blepharitis right eye, upper and lower eyelids: Secondary | ICD-10-CM | POA: Diagnosis not present

## 2023-09-02 DIAGNOSIS — H0288B Meibomian gland dysfunction left eye, upper and lower eyelids: Secondary | ICD-10-CM | POA: Diagnosis not present

## 2023-09-09 ENCOUNTER — Encounter: Payer: Self-pay | Admitting: Cardiology

## 2023-09-09 ENCOUNTER — Ambulatory Visit: Attending: Cardiology | Admitting: Cardiology

## 2023-09-09 VITALS — BP 128/64 | HR 74 | Ht 66.0 in | Wt 223.4 lb

## 2023-09-09 DIAGNOSIS — I10 Essential (primary) hypertension: Secondary | ICD-10-CM

## 2023-09-09 DIAGNOSIS — E782 Mixed hyperlipidemia: Secondary | ICD-10-CM

## 2023-09-09 DIAGNOSIS — Z952 Presence of prosthetic heart valve: Secondary | ICD-10-CM

## 2023-09-09 DIAGNOSIS — I25119 Atherosclerotic heart disease of native coronary artery with unspecified angina pectoris: Secondary | ICD-10-CM

## 2023-09-09 NOTE — Patient Instructions (Addendum)
 Medication Instructions:  Your physician recommends that you continue on your current medications as directed. Please refer to the Current Medication list given to you today.  Labwork: none  Testing/Procedures: Your physician has requested that you have an echocardiogram in December 2025. Echocardiography is a painless test that uses sound waves to create images of your heart. It provides your doctor with information about the size and shape of your heart and how well your heart's chambers and valves are working. This procedure takes approximately one hour. There are no restrictions for this procedure. Please do NOT wear cologne, perfume, aftershave, or lotions (deodorant is allowed). Please arrive 15 minutes prior to your appointment time.  Please note: We ask at that you not bring children with you during ultrasound (echo/ vascular) testing. Due to room size and safety concerns, children are not allowed in the ultrasound rooms during exams. Our front office staff cannot provide observation of children in our lobby area while testing is being conducted. An adult accompanying a patient to their appointment will only be allowed in the ultrasound room at the discretion of the ultrasound technician under special circumstances. We apologize for any inconvenience.  Follow-Up: Your physician recommends that you schedule a follow-up appointment in: 6 months  Any Other Special Instructions Will Be Listed Below (If Applicable).  If you need a refill on your cardiac medications before your next appointment, please call your pharmacy.

## 2023-09-09 NOTE — Progress Notes (Signed)
    Cardiology Office Note  Date: 09/09/2023   ID: LELYND POER, DOB [birth year], MRN [id number]  History of Present Illness: [name] is an 83 y.o. male last seen in December 2024.  He is here for a follow-up visit.  Reports no exertional angina, chronic exertional fatigue but still functional with ADLs and continues to enjoy hunting.  He does not report any orthopnea or PND, no palpitations or syncope.  He underwent bilateral upper eyelid blepharoptosis repair under [facility].  May need repeat procedures.  Holding aspirin  for these.  We discussed his medications, he reports compliance with current cardiac regimen.  His LDL was 41 in June.  Blood pressure is adequately controlled today.  He will be due for a follow-up echocardiogram in December.  Physical Exam: VS:  BP 128/64   Pulse 74   Ht 5' 6 (1.676 m)   Wt 223 lb 6.4 oz (101.3 kg)   SpO2 95%   BMI 36.06 kg/m , BMI Body mass index is 36.06 kg/m.  Wt Readings from Last 3 Encounters:  09/09/23 223 lb 6.4 oz (101.3 kg)  06/23/23 223 lb 3.2 oz (101.2 kg)  03/16/23 207 lb (93.9 kg)    General: Patient appears comfortable at rest. HEENT: Conjunctiva and lids normal. Neck: Supple, no elevated JVP or carotid bruits. Lungs: Clear to auscultation, nonlabored breathing at rest. Cardiac: Regular rate and rhythm, no S3, 2/6 systolic murmur.  ECG:  An ECG dated 12/30/2022 was personally reviewed today and demonstrated:  Sinus rhythm.  Labwork: 06/23/2023: ALT 22; AST 20; BUN 20; Creatinine, Ser 1.14; Hemoglobin 13.3; Platelets 298; Potassium 4.0; Sodium 138     Component Value Date/Time   CHOL 98 (L) 06/23/2023 0928   CHOL 139 06/02/2012 1007   TRIG 165 (H) 06/23/2023 0928   TRIG 185 (H) 04/10/2013 0937   TRIG 107 06/02/2012 1007   HDL 29 (L) 06/23/2023 0928   HDL 34 (L) 04/10/2013 0937   HDL 40 06/02/2012 1007   CHOLHDL 3.4 06/23/2023 0928   CHOLHDL 3.8 11/16/2019 0206   VLDL 25 11/16/2019 0206   LDLCALC 41  06/23/2023 0928   LDLCALC 62 04/10/2013 0937   LDLCALC 78 06/02/2012 1007   Other Studies Reviewed Today:  No interval cardiac testing for review today.  Assessment and Plan:  1.  History of severe degenerative calcific aortic stenosis status post TAVR in 2021 with 26 mm Edwards S3U.  Echocardiogram in December 2024 revealed stable prosthetic function with mean AV gradient 4 mmHg and no paravalvular regurgitation.  Plan to continue aspirin  81 mg every other day (easy bruising) and repeat follow-up echocardiogram in December of this year.   2.  CAD status post BMS to nondominant RCA in 2006 and DES x 2 to the mid LAD in 2021.  He does not describe any active angina, exertional fatigue is more chronic.  Continue Lipitor  40 mg daily, Imdur  30 mg daily, and as needed nitroglycerin .   3.  Mixed hyperlipidemia, LDL 41 in June.  Continue Lipitor  40 mg daily.   4.  Primary hypertension.  Blood pressure control adequate today.  Continue Norvasc  10 mg daily and chlorthalidone  25 mg daily.  Disposition:  Follow up 6 months.  Signed, Jayson JUDITHANN Sierras, M.D., F.A.C.C. [facility]

## 2023-09-13 DIAGNOSIS — H53483 Generalized contraction of visual field, bilateral: Secondary | ICD-10-CM | POA: Diagnosis not present

## 2023-10-04 DIAGNOSIS — H02834 Dermatochalasis of left upper eyelid: Secondary | ICD-10-CM | POA: Diagnosis not present

## 2023-10-04 DIAGNOSIS — H02423 Myogenic ptosis of bilateral eyelids: Secondary | ICD-10-CM | POA: Diagnosis not present

## 2023-10-04 DIAGNOSIS — H02413 Mechanical ptosis of bilateral eyelids: Secondary | ICD-10-CM | POA: Diagnosis not present

## 2023-10-04 DIAGNOSIS — H02132 Senile ectropion of right lower eyelid: Secondary | ICD-10-CM | POA: Diagnosis not present

## 2023-10-04 DIAGNOSIS — H0259 Other disorders affecting eyelid function: Secondary | ICD-10-CM | POA: Diagnosis not present

## 2023-10-04 DIAGNOSIS — H02422 Myogenic ptosis of left eyelid: Secondary | ICD-10-CM | POA: Diagnosis not present

## 2023-10-04 DIAGNOSIS — H57813 Brow ptosis, bilateral: Secondary | ICD-10-CM | POA: Diagnosis not present

## 2023-10-04 DIAGNOSIS — H02135 Senile ectropion of left lower eyelid: Secondary | ICD-10-CM | POA: Diagnosis not present

## 2023-10-04 DIAGNOSIS — H02421 Myogenic ptosis of right eyelid: Secondary | ICD-10-CM | POA: Diagnosis not present

## 2023-10-05 ENCOUNTER — Ambulatory Visit: Admitting: Family Medicine

## 2023-10-15 ENCOUNTER — Other Ambulatory Visit: Payer: Self-pay | Admitting: Cardiology

## 2023-10-24 ENCOUNTER — Telehealth: Payer: Self-pay | Admitting: Family Medicine

## 2023-10-24 ENCOUNTER — Ambulatory Visit: Admitting: Family Medicine

## 2023-10-24 ENCOUNTER — Encounter: Payer: Self-pay | Admitting: Family Medicine

## 2023-10-24 VITALS — BP 137/71 | HR 80 | Temp 98.7°F | Ht 66.0 in | Wt 229.8 lb

## 2023-10-24 DIAGNOSIS — E1129 Type 2 diabetes mellitus with other diabetic kidney complication: Secondary | ICD-10-CM | POA: Diagnosis not present

## 2023-10-24 DIAGNOSIS — E1169 Type 2 diabetes mellitus with other specified complication: Secondary | ICD-10-CM

## 2023-10-24 DIAGNOSIS — Z794 Long term (current) use of insulin: Secondary | ICD-10-CM | POA: Diagnosis not present

## 2023-10-24 DIAGNOSIS — Z23 Encounter for immunization: Secondary | ICD-10-CM

## 2023-10-24 DIAGNOSIS — I2510 Atherosclerotic heart disease of native coronary artery without angina pectoris: Secondary | ICD-10-CM | POA: Diagnosis not present

## 2023-10-24 DIAGNOSIS — E1159 Type 2 diabetes mellitus with other circulatory complications: Secondary | ICD-10-CM

## 2023-10-24 DIAGNOSIS — E785 Hyperlipidemia, unspecified: Secondary | ICD-10-CM | POA: Diagnosis not present

## 2023-10-24 DIAGNOSIS — I152 Hypertension secondary to endocrine disorders: Secondary | ICD-10-CM | POA: Diagnosis not present

## 2023-10-24 DIAGNOSIS — R809 Proteinuria, unspecified: Secondary | ICD-10-CM

## 2023-10-24 LAB — BAYER DCA HB A1C WAIVED: HB A1C (BAYER DCA - WAIVED): 6.6 % — ABNORMAL HIGH (ref 4.8–5.6)

## 2023-10-24 MED ORDER — PAROXETINE HCL 20 MG PO TABS: 20.0000 mg | ORAL_TABLET | Freq: Every day | ORAL | 3 refills | Status: AC

## 2023-10-24 MED ORDER — SEMAGLUTIDE (2 MG/DOSE) 8 MG/3ML ~~LOC~~ SOPN: 2.0000 mg | PEN_INJECTOR | SUBCUTANEOUS | 3 refills | Status: DC

## 2023-10-24 MED ORDER — AMLODIPINE BESYLATE 10 MG PO TABS: 10.0000 mg | ORAL_TABLET | Freq: Every day | ORAL | 3 refills | Status: AC

## 2023-10-24 MED ORDER — ONETOUCH ULTRA VI STRP: ORAL_STRIP | 3 refills | Status: DC

## 2023-10-24 MED ORDER — FAMOTIDINE 20 MG PO TABS: 20.0000 mg | ORAL_TABLET | Freq: Two times a day (BID) | ORAL | 3 refills | Status: AC

## 2023-10-24 NOTE — Telephone Encounter (Signed)
 Copied from CRM [id number]. Topic: Clinical - Prescription Issue >> Oct 24, 2023 11:08 AM [name] wrote: Reason for CRM: Patients wife [name] states that the patients prescription for Semaglutide , 2 MG/DOSE, 8 MG/3ML SOPN does not come from [facility], needs to be canceled. Mliss Griffin the pharmacist set them up in a program where they get it for free. Patient wants to make sure they are not billed.  [name] can be reached at [phone]

## 2023-10-24 NOTE — Progress Notes (Signed)
 BP 137/71   Pulse 80   Temp 98.7 F (37.1 C) (Temporal)   Ht 5' 6 (1.676 m)   Wt 229 lb 12.8 oz (104.2 kg)   SpO2 96%   BMI 37.09 kg/m    Subjective:   Patient ID: [name], male    DOB: 09-Jan-1941, 83 y.o.   MRN: [id number]  HPI: [name] is a 83 y.o. male presenting on 10/24/2023 for Medical Management of Chronic Issues   Discussed the use of AI scribe software for clinical note transcription with the patient, who gave verbal consent to proceed.  History of Present Illness   [name] is an 83 year old male with diabetes and hypertension who presents for a recheck of blood sugar and blood pressure.  Hyperglycemia and diabetes management - Blood glucose levels are fluctuating, with a high of 240 mg/dL this morning - Did not administer insulin  this morning; took nighttime medication at midnight - Diet last night included sweet potato and fish with sauce - Typically administers 20 units of Humalog  before meals; reduces to 15 units if blood sugar is low - Takes 56 units of Lantus  nightly - Currently on Ozempic  and metformin   Hypertension and cardiovascular risk management - Takes amlodipine  and Imdur  for blood pressure control - Temporarily discontinued atorvastatin  due to recent eye procedure; plans to restart - Takes fish oil for cholesterol management  Excessive daytime sleepiness and sleep apnea - Experiences excessive daytime sleepiness, often falls asleep in his truck - Sleeps through the night - Uses CPAP mask nightly - CPAP settings are checked annually  Chronic lower back pain and arthritis - Chronic lower back pain worsens after approximately ten minutes of activity, requiring him to sit down - Takes Tylenol  for arthritis, two tablets in the morning and two at night - Uses topical treatments such as [facility] for pain relief - Looking at his phone causes pain; must look up or down to relieve discomfort          Relevant  past medical, surgical, family and social history reviewed and updated as indicated. Interim medical history since our last visit reviewed. Allergies and medications reviewed and updated.  Review of Systems  Constitutional:  Negative for chills and fever.  Eyes:  Negative for visual disturbance.  Respiratory:  Negative for shortness of breath and wheezing.   Cardiovascular:  Negative for chest pain and leg swelling.  Musculoskeletal:  Positive for arthralgias, back pain and myalgias. Negative for gait problem.  Skin:  Negative for rash.  Neurological:  Negative for dizziness and light-headedness.  All other systems reviewed and are negative.   Per HPI unless specifically indicated above   Allergies as of 10/24/2023       Reactions   Ace Inhibitors Hives, Swelling, Rash   Rash,hives,tongue swelling   Angiotensin Receptor Blockers    Unknown reaction    Oxycodone-acetaminophen  Other (See Comments)   Unknown reaction   Robaxin [methocarbamol] Other (See Comments)   Confusion    Toradol  [ketorolac  Tromethamine ] Other (See Comments)   confusion   Valium Other (See Comments)   Hallucinations; confusion   Doxycycline Hives, Rash        Medication List        Accurate as of October 24, 2023 10:18 AM. If you have any questions, ask your nurse or doctor.          STOP taking these medications    butalbital -acetaminophen -caffeine  50-325-40 MG tablet Commonly known  as: FIORICET  Stopped by: Fonda LABOR Adeleine Pask       TAKE these medications    acetaminophen  650 MG CR tablet Commonly known as: TYLENOL  Take 1,300 mg by mouth 2 (two) times daily.   amLODipine  10 MG tablet Commonly known as: NORVASC  Take 1 tablet (10 mg total) by mouth daily.   aspirin  EC 81 MG tablet Take 81 mg by mouth every other day.   atorvastatin  40 MG tablet Commonly known as: LIPITOR  TAKE 1 TABLET BY MOUTH IN THE  EVENING   cetirizine 10 MG tablet Commonly known as: ZYRTEC Take 10 mg by  mouth daily with supper.   chlorthalidone  25 MG tablet Commonly known as: HYGROTON  TAKE 1 TABLET BY MOUTH DAILY   cyanocobalamin  1000 MCG tablet Commonly known as: VITAMIN B12 Take 2,000 mcg by mouth daily.   Dexcom G7 Receiver Devi Use to test blood sugar continuously.  DX: E11.65 patient to use good rx card; receiver only   Dexcom G7 Sensor Misc DX: E11.9   APPLY SENSOR TO ARM EVERY 10 DAYS.   docusate sodium  100 MG capsule Commonly known as: COLACE Take 100 mg by mouth daily as needed for mild constipation or moderate constipation.   donepezil  5 MG tablet Commonly known as: ARICEPT  TAKE 1 TABLET BY MOUTH AT  BEDTIME   famotidine  20 MG tablet Commonly known as: PEPCID  Take 1 tablet (20 mg total) by mouth 2 (two) times daily with a meal.   Fish Oil 1200 MG Caps Take 1,200-2,400 mg by mouth See admin instructions. Tale 1200 mg in the morning and 2400 mg in the evening   hydrocortisone  2.5 % cream Apply topically as directed. Qd aa forehead and posterior neck prn itching   insulin  lispro 100 UNIT/ML KwikPen Commonly known as: HumaLOG  KwikPen Inject 20 Units into the skin 3 (three) times daily with meals.   insulin  lispro 100 UNIT/ML KwikPen Commonly known as: HumaLOG  KwikPen Inject 20 Units into the skin 3 (three) times daily with meals.   isosorbide  mononitrate 30 MG 24 hr tablet Commonly known as: IMDUR  TAKE 1 TABLET BY MOUTH IN THE  EVENING   Lantus  SoloStar 100 UNIT/ML Solostar Pen Generic drug: insulin  glargine Inject 56 Units into the skin at bedtime.   metFORMIN  500 MG 24 hr tablet Commonly known as: GLUCOPHAGE -XR Take 2 tablets (1,000 mg total) by mouth 2 (two) times daily with a meal.   nitroGLYCERIN  0.4 MG SL tablet Commonly known as: NITROSTAT  DISSOLVE ONE TABLET UNDER THE TONGUE EVERY 5 MINUTES AS NEEDED FOR CHEST PAIN.  DO NOT EXCEED A TOTAL OF 3 DOSES IN 15 MINUTES   OneTouch Ultra test strip Generic drug: glucose blood Use to test blood sugar  4 times daily as directed. Dx E11.59   PARoxetine  20 MG tablet Commonly known as: PAXIL  Take 1 tablet (20 mg total) by mouth daily.   Semaglutide  (2 MG/DOSE) 8 MG/3ML Sopn Inject 2 mg as directed once a week.   tamsulosin  0.4 MG Caps capsule Commonly known as: FLOMAX  TAKE 1 CAPSULE BY MOUTH DAILY   Turmeric Curcumin 500 MG Caps Take 500 mg by mouth daily.   Vitamin D3 50 MCG (2000 UT) capsule Take 4,000 Units by mouth daily.         Objective:   BP 137/71   Pulse 80   Temp 98.7 F (37.1 C) (Temporal)   Ht 5' 6 (1.676 m)   Wt 229 lb 12.8 oz (104.2 kg)   SpO2 96%   BMI  37.09 kg/m   Wt Readings from Last 3 Encounters:  10/24/23 229 lb 12.8 oz (104.2 kg)  09/09/23 223 lb 6.4 oz (101.3 kg)  06/23/23 223 lb 3.2 oz (101.2 kg)    Physical Exam Physical Exam   VITALS: BP- 137/71 CHEST: Lungs clear to auscultation. CARDIOVASCULAR: Regular heart sounds. Pulses intact. EXTREMITIES: No edema in lower extremities.         Assessment & Plan:   Problem List Items Addressed This Visit       Cardiovascular and Mediastinum   Hypertension associated with diabetes (HCC)   Relevant Medications   Semaglutide , 2 MG/DOSE, 8 MG/3ML SOPN   amLODipine  (NORVASC ) 10 MG tablet   CORONARY ATHEROSCLEROSIS NATIVE CORONARY ARTERY   Relevant Medications   amLODipine  (NORVASC ) 10 MG tablet   Type 2 diabetes mellitus with circulatory disorder (HCC) - Primary   Relevant Medications   glucose blood (ONETOUCH ULTRA) test strip   Semaglutide , 2 MG/DOSE, 8 MG/3ML SOPN   amLODipine  (NORVASC ) 10 MG tablet   Other Relevant Orders   Bayer DCA Hb A1c Waived   Microalbumin / creatinine urine ratio     Endocrine   Hyperlipidemia associated with type 2 diabetes mellitus (HCC)   Relevant Medications   Semaglutide , 2 MG/DOSE, 8 MG/3ML SOPN   amLODipine  (NORVASC ) 10 MG tablet   Microalbuminuria due to type 2 diabetes mellitus (HCC)   Relevant Medications   Semaglutide , 2 MG/DOSE, 8 MG/3ML  SOPN   Other Relevant Orders   Microalbumin / creatinine urine ratio       Type 2 diabetes mellitus with other specified complications Blood glucose levels fluctuating with 43% in range and 43% high. Recent high of 240 mg/dL post sweet potato. Insulin  regimen includes Lantus  and Humalog . Continues on Ozempic  and metformin . - Continue current insulin  regimen with Lantus  and Humalog . - Continue Ozempic  and metformin . - A1c looks good at 6.6.  Atherosclerotic heart disease of native coronary artery without angina pectoris Blood pressure controlled at 137/71 mmHg. Heart rate and oxygen levels stable. Continues on amlodipine  and Imdur . - Continue amlodipine  and Imdur . - Continue atorvastatin  and fish oils for cholesterol management.  Obstructive sleep apnea Experiencing excessive daytime sleepiness despite nightly CPAP use. Possible need to reassess CPAP settings. - Check CPAP settings to ensure they are optimal.  Chronic low back pain due to osteoarthritis Persistent low back pain exacerbated by activity. Managed with Tylenol . Advised use of topical muscle rubs and activity maintenance. - Continue Tylenol  for pain management. - Use topical muscle rubs like Icy Hot or Blue Emu daily. - Encourage regular movement and stretching exercises.          Follow up plan: Return in about 3 months (around 01/24/2024), or if symptoms worsen or fail to improve, for diabetes.  Counseling provided for all of the vaccine components Orders Placed This Encounter  Procedures   Bayer DCA Hb A1c Waived   Microalbumin / creatinine urine ratio    Fonda Levins, MD [facility] 10/24/2023, 10:19 AM

## 2023-10-26 ENCOUNTER — Other Ambulatory Visit: Payer: Self-pay | Admitting: *Deleted

## 2023-10-26 MED ORDER — ACCU-CHEK GUIDE ME W/DEVICE KIT
PACK | 0 refills | Status: AC
Start: 2023-10-26 — End: ?

## 2023-10-26 MED ORDER — ACCU-CHEK SOFTCLIX LANCETS MISC
3 refills | Status: AC
Start: 2023-10-26 — End: ?

## 2023-10-26 MED ORDER — ACCU-CHEK GUIDE TEST VI STRP: ORAL_STRIP | 3 refills | Status: AC

## 2023-10-26 NOTE — Telephone Encounter (Signed)
 Fax from [facility] RE: One Touch Ultra - not covered by insurance Alternatives: AccuChek Guide, Contour Plus Blue Sent in [employer] Me meter, test strips & lancets

## 2023-10-27 ENCOUNTER — Telehealth: Payer: Self-pay

## 2023-10-27 NOTE — Telephone Encounter (Signed)
 Patients wife called regarding [facility] letter he rec'd about Ozempic  changes for 2026.   She understands the changes and will look into LIS with [facility].  Says he has an overage on Lilly insulins right now and will make sure for re-enrollment she takes his insulins off auto refill.  She will also call back at the end of the month if she doesn't receive his lilly cares re-enrollment application from the company.

## 2023-11-01 ENCOUNTER — Encounter: Payer: Self-pay | Admitting: Physician Assistant

## 2023-11-01 DIAGNOSIS — E113491 Type 2 diabetes mellitus with severe nonproliferative diabetic retinopathy without macular edema, right eye: Secondary | ICD-10-CM | POA: Diagnosis not present

## 2023-11-01 DIAGNOSIS — E113412 Type 2 diabetes mellitus with severe nonproliferative diabetic retinopathy with macular edema, left eye: Secondary | ICD-10-CM | POA: Diagnosis not present

## 2023-11-01 DIAGNOSIS — H04123 Dry eye syndrome of bilateral lacrimal glands: Secondary | ICD-10-CM | POA: Diagnosis not present

## 2023-11-01 DIAGNOSIS — H43813 Vitreous degeneration, bilateral: Secondary | ICD-10-CM | POA: Diagnosis not present

## 2023-11-01 LAB — OPHTHALMOLOGY REPORT-SCANNED

## 2023-11-01 NOTE — Progress Notes (Signed)
 [name]                                          MRN: [id number]   11/01/2023   The VBCI Quality Team Specialist reviewed this patient medical record for the purposes of chart review for care gap closure. The following were reviewed: chart review for care gap closure-kidney health evaluation for diabetes:eGFR  and uACR.    VBCI Quality Team

## 2023-11-10 NOTE — Telephone Encounter (Signed)
Faxed refills to [facility].

## 2023-11-10 NOTE — Telephone Encounter (Signed)
 Last refill form for Ozempic  in process of completion. With [employer].

## 2023-11-14 NOTE — Telephone Encounter (Signed)
 Wife left message.   Rec'd letter from [place] regarding re-enrollment.   Would like application mailed. Will mail application for Humalog  and Basaglar .

## 2023-11-22 ENCOUNTER — Encounter: Payer: Self-pay | Admitting: Family Medicine

## 2023-11-22 DIAGNOSIS — E1159 Type 2 diabetes mellitus with other circulatory complications: Secondary | ICD-10-CM

## 2023-11-22 DIAGNOSIS — E1129 Type 2 diabetes mellitus with other diabetic kidney complication: Secondary | ICD-10-CM

## 2023-11-24 MED ORDER — SEMAGLUTIDE (2 MG/DOSE) 8 MG/3ML ~~LOC~~ SOPN: 2.0000 mg | PEN_INJECTOR | SUBCUTANEOUS | 3 refills | Status: DC

## 2023-12-02 ENCOUNTER — Telehealth: Payer: Self-pay

## 2023-12-02 NOTE — Telephone Encounter (Signed)
 Rec'd completed patient portion of [employer] application for patients re-enrollment for Basaglar  and Humalog  insulins.   Emailed pg 8 of application to Julie P for completion.

## 2023-12-02 NOTE — Telephone Encounter (Signed)
 New encounter created for [employer] re-enrollment

## 2023-12-09 LAB — LAB REPORT - SCANNED: Albumin/Creatinine Ratio, Urine, POC: 82

## 2023-12-22 ENCOUNTER — Other Ambulatory Visit: Payer: Self-pay | Admitting: Cardiology

## 2023-12-22 ENCOUNTER — Other Ambulatory Visit: Attending: Cardiology

## 2023-12-22 DIAGNOSIS — Z952 Presence of prosthetic heart valve: Secondary | ICD-10-CM

## 2023-12-22 DIAGNOSIS — I25119 Atherosclerotic heart disease of native coronary artery with unspecified angina pectoris: Secondary | ICD-10-CM

## 2023-12-22 DIAGNOSIS — E782 Mixed hyperlipidemia: Secondary | ICD-10-CM

## 2023-12-22 DIAGNOSIS — I10 Essential (primary) hypertension: Secondary | ICD-10-CM

## 2023-12-22 NOTE — Telephone Encounter (Signed)
 PAP: RE-ENROLMENT application for Basaglar  and Humalog  has been submitted to [employer], via fax.  Please e-scribe new 90 day RX's to [facility] specialty pharmacy for re-enrollment. Thanks!

## 2023-12-23 ENCOUNTER — Ambulatory Visit: Payer: Self-pay | Admitting: Cardiology

## 2023-12-23 ENCOUNTER — Telehealth: Payer: Self-pay | Admitting: Pharmacist

## 2023-12-23 DIAGNOSIS — E1159 Type 2 diabetes mellitus with other circulatory complications: Secondary | ICD-10-CM

## 2023-12-23 LAB — ECHOCARDIOGRAM COMPLETE
AR max vel: 1.58 cm2
AV Area VTI: 1.75 cm2
AV Area mean vel: 1.82 cm2
AV Mean grad: 12 mmHg
AV Peak grad: 23.2 mmHg
Ao pk vel: 2.41 m/s
Area-P 1/2: 3.19 cm2
Calc EF: 61.7 %
MV VTI: 3.35 cm2
S' Lateral: 2.5 cm
Single Plane A2C EF: 62.5 %
Single Plane A4C EF: 58.1 %

## 2023-12-23 NOTE — Telephone Encounter (Signed)
° °  Patient transitioning to Basaglar  & Humlaog with [employer] cares patient assistance program.for 2026.  Updated RX escribed to [employer] order (pharmacy for [place] cares patient assistance [phone])   Mliss Tarry Griffin, PharmD, BCACP, CPP Clinical Pharmacist, [facility]

## 2024-01-03 NOTE — Telephone Encounter (Signed)
 PAP: Patient assistance RE-ENROLLMENT application for Basaglar  and Humalog  has been approved by PAP Companies: [employer] CAR[employer] from 12/28/23 to 01/10/25.   Medication should be delivered to: Home.   For further shipping updates, please contact [employer] C[employer] at [phone].

## 2024-01-15 ENCOUNTER — Other Ambulatory Visit: Payer: Self-pay | Admitting: Family Medicine

## 2024-01-19 ENCOUNTER — Telehealth: Payer: Self-pay | Admitting: Cardiology

## 2024-01-19 MED ORDER — NITROGLYCERIN 0.4 MG SL SUBL: SUBLINGUAL_TABLET | SUBLINGUAL | 7 refills | Status: AC

## 2024-01-19 NOTE — Telephone Encounter (Signed)
 Pt's medication was sent to pt's pharmacy as requested. Confirmation received.

## 2024-01-19 NOTE — Telephone Encounter (Signed)
 1. Which medications need to be refilled? (please list name of each medication and dose if known)   Nitroglycerin  SL TAB 2. Which pharmacy/location (including street and city if local pharmacy) is medication to be sent to?  [facility]  3. Do they need a 30 day or 90 day supply?   90

## 2024-01-23 ENCOUNTER — Other Ambulatory Visit: Payer: Self-pay | Admitting: Family Medicine

## 2024-01-23 DIAGNOSIS — E1159 Type 2 diabetes mellitus with other circulatory complications: Secondary | ICD-10-CM

## 2024-01-23 DIAGNOSIS — E1129 Type 2 diabetes mellitus with other diabetic kidney complication: Secondary | ICD-10-CM

## 2024-02-01 ENCOUNTER — Telehealth (HOSPITAL_BASED_OUTPATIENT_CLINIC_OR_DEPARTMENT_OTHER): Payer: Self-pay

## 2024-02-01 ENCOUNTER — Ambulatory Visit: Payer: Self-pay | Admitting: Family Medicine

## 2024-02-01 ENCOUNTER — Encounter: Payer: Self-pay | Admitting: Family Medicine

## 2024-02-01 VITALS — BP 137/72 | HR 72 | Ht 66.0 in | Wt 226.0 lb

## 2024-02-01 DIAGNOSIS — E1159 Type 2 diabetes mellitus with other circulatory complications: Secondary | ICD-10-CM

## 2024-02-01 DIAGNOSIS — Z794 Long term (current) use of insulin: Secondary | ICD-10-CM

## 2024-02-01 DIAGNOSIS — E1140 Type 2 diabetes mellitus with diabetic neuropathy, unspecified: Secondary | ICD-10-CM | POA: Diagnosis not present

## 2024-02-01 DIAGNOSIS — E785 Hyperlipidemia, unspecified: Secondary | ICD-10-CM

## 2024-02-01 DIAGNOSIS — F02B3 Dementia in other diseases classified elsewhere, moderate, with mood disturbance: Secondary | ICD-10-CM | POA: Diagnosis not present

## 2024-02-01 DIAGNOSIS — R809 Proteinuria, unspecified: Secondary | ICD-10-CM

## 2024-02-01 DIAGNOSIS — E66812 Obesity, class 2: Secondary | ICD-10-CM | POA: Diagnosis not present

## 2024-02-01 DIAGNOSIS — E1169 Type 2 diabetes mellitus with other specified complication: Secondary | ICD-10-CM | POA: Diagnosis not present

## 2024-02-01 DIAGNOSIS — E1129 Type 2 diabetes mellitus with other diabetic kidney complication: Secondary | ICD-10-CM | POA: Diagnosis not present

## 2024-02-01 DIAGNOSIS — G4733 Obstructive sleep apnea (adult) (pediatric): Secondary | ICD-10-CM

## 2024-02-01 DIAGNOSIS — I152 Hypertension secondary to endocrine disorders: Secondary | ICD-10-CM

## 2024-02-01 DIAGNOSIS — G301 Alzheimer's disease with late onset: Secondary | ICD-10-CM | POA: Diagnosis not present

## 2024-02-01 LAB — BAYER DCA HB A1C WAIVED: HB A1C (BAYER DCA - WAIVED): 7.2 % — ABNORMAL HIGH (ref 4.8–5.6)

## 2024-02-01 NOTE — Telephone Encounter (Signed)
" ° °  Name: [name]  DOB: 1940-07-04  MRN: [id number]  Primary Cardiologist: Jayson Sierras, MD   Preoperative team, please contact this patient and set up a phone call appointment for further preoperative risk assessment. Please obtain consent and complete medication review. Thank you for your help.  Okay to add to provider slot tomorrow, 1/22.  I confirm that guidance regarding antiplatelet and oral anticoagulation therapy has been completed and, if necessary, noted below.  Please ask patient if he is already holding aspirin . If he is not he should stop aspirin  today and contact Dr. Zaldivar to report holding aspirin  for 5 days to make sure that is acceptable.   I also confirmed the patient resides in the state of [place] . As per [facility] Medical Board telemedicine laws, the patient must reside in the state in which the provider is licensed.    Barnie Hila, NP 02/01/2024, 11:45 AM [facility]     "

## 2024-02-01 NOTE — Telephone Encounter (Signed)
 Called and left the patient a detailed message on instructions on when to hold his Aspirin . Clearance to happen on 02/02/24.

## 2024-02-01 NOTE — Progress Notes (Signed)
 "  BP 137/72   Pulse 72   Ht 5' 6 (1.676 m)   Wt 226 lb (102.5 kg)   SpO2 98%   BMI 36.48 kg/m    Subjective:   Patient ID: [name], male    DOB: [birth year], 84 y.o.   MRN: [id number]  HPI: [name] is a 84 y.o. male presenting on 02/01/2024 for Medical Management of Chronic Issues, Diabetes, Hyperlipidemia, Hypertension, and Insomnia (Sleepy during the day- wears cpap)   Discussed the use of AI scribe software for clinical note transcription with the patient, who gave verbal consent to proceed.  History of Present Illness   [name] is an 84 year old male with hypertension, diabetes, and Alzheimer's disease who presents for a recheck of his conditions.  Hypertension - Regularly monitors blood pressure.  Diabetes mellitus - Monitors blood glucose levels, generally ranging between 100 and 200 mg/dL. - Uses Humalog  for postprandial hyperglycemia, especially after high carbohydrate meals.  Alzheimer's disease - Continues Aricept  therapy. - Experiences difficulty remembering names. - No perceived worsening of overall memory.  Obstructive sleep apnea - Experiences excessive daytime sleepiness. - Uses CPAP machine nightly.  Peripheral neuropathy - Ongoing burning and tingling sensations in feet and ankles. - Uses a machine and handheld device for symptomatic relief, applied twice daily; has reduced usage due to discomfort. - Applies topical treatment to feet, which provides some relief.          Relevant past medical, surgical, family and social history reviewed and updated as indicated. Interim medical history since our last visit reviewed. Allergies and medications reviewed and updated.  Review of Systems  Constitutional:  Positive for fatigue. Negative for chills and fever.  Eyes:  Negative for visual disturbance.  Respiratory:  Negative for shortness of breath and wheezing.   Cardiovascular:  Negative for chest pain and leg  swelling.  Skin:  Negative for rash.  Psychiatric/Behavioral:  Positive for sleep disturbance.   All other systems reviewed and are negative.   Per HPI unless specifically indicated above   Allergies as of 02/01/2024       Reactions   Ace Inhibitors Hives, Swelling, Rash   Rash,hives,tongue swelling   Angiotensin Receptor Blockers    Unknown reaction    Oxycodone-acetaminophen  Other (See Comments)   Unknown reaction   Robaxin [methocarbamol] Other (See Comments)   Confusion    Toradol  [ketorolac  Tromethamine ] Other (See Comments)   confusion   Valium Other (See Comments)   Hallucinations; confusion   Doxycycline Hives, Rash        Medication List        Accurate as of February 01, 2024 10:03 AM. If you have any questions, ask your nurse or doctor.          Accu-Chek Guide Me w/Device Kit test blood sugar 4 times daily as directed. Dx E11.59   Accu-Chek Guide Test test strip Generic drug: glucose blood test blood sugar 4 times daily as directed. Dx E11.59   Accu-Chek Softclix Lancets lancets test blood sugar 4 times daily as directed. Dx E11.59   acetaminophen  650 MG CR tablet Commonly known as: TYLENOL  Take 1,300 mg by mouth 2 (two) times daily.   amLODipine  10 MG tablet Commonly known as: NORVASC  Take 1 tablet (10 mg total) by mouth daily.   aspirin  EC 81 MG tablet Take 81 mg by mouth every other day.   atorvastatin  40 MG tablet Commonly known as: LIPITOR  TAKE 1 TABLET BY  MOUTH IN THE  EVENING   Basaglar  KwikPen 100 UNIT/ML Inject 56 Units into the skin daily.   cetirizine 10 MG tablet Commonly known as: ZYRTEC Take 10 mg by mouth daily with supper.   chlorthalidone  25 MG tablet Commonly known as: HYGROTON  TAKE 1 TABLET BY MOUTH DAILY   cyanocobalamin  1000 MCG tablet Commonly known as: VITAMIN B12 Take 2,000 mcg by mouth daily.   Dexcom G7 Receiver Devi Use to test blood sugar continuously.  DX: E11.65 patient to use good rx card;  receiver only   Dexcom G7 Sensor Misc DX: E11.9   APPLY SENSOR TO ARM EVERY 10 DAYS.   docusate sodium  100 MG capsule Commonly known as: COLACE Take 100 mg by mouth daily as needed for mild constipation or moderate constipation.   donepezil  5 MG tablet Commonly known as: ARICEPT  TAKE 1 TABLET BY MOUTH AT  BEDTIME   famotidine  20 MG tablet Commonly known as: PEPCID  Take 1 tablet (20 mg total) by mouth 2 (two) times daily with a meal.   Fish Oil 1200 MG Caps Take 1,200-2,400 mg by mouth See admin instructions. Tale 1200 mg in the morning and 2400 mg in the evening   hydrocortisone  2.5 % cream Apply topically as directed. Qd aa forehead and posterior neck prn itching   insulin  lispro 100 UNIT/ML KwikPen Commonly known as: HumaLOG  KwikPen Inject 20 Units into the skin 3 (three) times daily with meals.   isosorbide  mononitrate 30 MG 24 hr tablet Commonly known as: IMDUR  TAKE 1 TABLET BY MOUTH IN THE  EVENING   metFORMIN  500 MG 24 hr tablet Commonly known as: GLUCOPHAGE -XR TAKE 2 TABLETS BY MOUTH TWICE  DAILY WITH MEALS   nitroGLYCERIN  0.4 MG SL tablet Commonly known as: NITROSTAT  DISSOLVE ONE TABLET UNDER THE TONGUE EVERY 5 MINUTES AS NEEDED FOR CHEST PAIN.  DO NOT EXCEED A TOTAL OF 3 DOSES IN 15 MINUTES   Ozempic  (2 MG/DOSE) 8 MG/3ML Sopn Generic drug: Semaglutide  (2 MG/DOSE) INJECT SUBCUTANEOUSLY 2 MG EVERY WEEK AS DIRECTED   PARoxetine  20 MG tablet Commonly known as: PAXIL  Take 1 tablet (20 mg total) by mouth daily.   tamsulosin  0.4 MG Caps capsule Commonly known as: FLOMAX  TAKE 1 CAPSULE BY MOUTH DAILY   Turmeric Curcumin 500 MG Caps Take 500 mg by mouth daily.   Vitamin D3 50 MCG (2000 UT) capsule Take 4,000 Units by mouth daily.         Objective:   BP 137/72   Pulse 72   Ht 5' 6 (1.676 m)   Wt 226 lb (102.5 kg)   SpO2 98%   BMI 36.48 kg/m   Wt Readings from Last 3 Encounters:  02/01/24 226 lb (102.5 kg)  10/24/23 229 lb 12.8 oz (104.2 kg)   09/09/23 223 lb 6.4 oz (101.3 kg)    Physical Exam Vitals and nursing note reviewed.  Constitutional:      General: He is not in acute distress.    Appearance: He is well-developed. He is not diaphoretic.  Eyes:     General: No scleral icterus.    Conjunctiva/sclera: Conjunctivae normal.  Neck:     Thyroid : No thyromegaly.  Cardiovascular:     Rate and Rhythm: Normal rate and regular rhythm.     Heart sounds: Normal heart sounds. No murmur heard. Pulmonary:     Effort: Pulmonary effort is normal. No respiratory distress.     Breath sounds: Normal breath sounds. No wheezing.  Musculoskeletal:  General: Normal range of motion.     Cervical back: Neck supple.  Lymphadenopathy:     Cervical: No cervical adenopathy.  Skin:    General: Skin is warm and dry.     Findings: No rash.  Neurological:     Mental Status: He is alert and oriented to person, place, and time.     Coordination: Coordination normal.  Psychiatric:        Behavior: Behavior normal.       VITALS: BP- 137/72 NECK: Thyroid  non-tender, no masses. CHEST: Lungs clear to auscultation bilaterally. CARDIOVASCULAR: Heart regular rate and rhythm, no murmurs. EXTREMITIES: No edema, good peripheral pulses.         Assessment & Plan:   Problem List Items Addressed This Visit       Cardiovascular and Mediastinum   Hypertension associated with diabetes (HCC) - Primary   Relevant Orders   CBC with Differential/Platelet   CMP14+EGFR   Lipid panel   PSA, total and free   TSH   Vitamin B12   Bayer DCA Hb A1c Waived   Type 2 diabetes mellitus with circulatory disorder (HCC)     Respiratory   Obstructive sleep apnea     Endocrine   Hyperlipidemia associated with type 2 diabetes mellitus (HCC)   Relevant Orders   CBC with Differential/Platelet   CMP14+EGFR   Lipid panel   PSA, total and free   TSH   Vitamin B12   Bayer DCA Hb A1c Waived   Microalbuminuria due to type 2 diabetes mellitus (HCC)    Type 2 diabetes mellitus with diabetic neuropathy, with long-term current use of insulin  (HCC)   Relevant Orders   Hgb A1c w/o eAG     Nervous and Auditory   Moderate late onset Alzheimer's dementia with mood disturbance (HCC)     Other   Obesity, morbid (HCC)       Type 2 diabetes mellitus complicated by diabetic neuropathy and circulatory complications Blood glucose generally controlled with occasional postprandial elevations. Neuropathy managed with machine, causing burning in ankles if overused. - Continue current diabetes management regimen, including Humalog  for carbohydrate coverage. - Use neuropathy machine every other day. - Monitor blood glucose regularly.  Moderate late onset Alzheimer's dementia with mood disturbance Memory issues persist without significant worsening. Continues on Aricept . - Continue Aricept .          Follow up plan: Return in about 3 months (around 05/01/2024), or if symptoms worsen or fail to improve, for Diabetes recheck.  Counseling provided for all of the vaccine components Orders Placed This Encounter  Procedures   CBC with Differential/Platelet   CMP14+EGFR   Lipid panel   PSA, total and free   TSH   Vitamin B12   Bayer DCA Hb A1c Waived   Hgb A1c w/o eAG    Fonda Levins, MD [facility] 02/01/2024, 10:03 AM     "

## 2024-02-01 NOTE — Telephone Encounter (Signed)
"  ° °  Pre-operative Risk Assessment    Patient Name: [name]  DOB: [birth year] MRN: [id number]   Date of last office visit: 09/09/2023 with Dr. Debera Date of next office visit: 03/07/2024 with Dr. Debera  Request for Surgical Clearance    Procedure:  Bilateral lower eyelid senile ectropion and retraction repair  Date of Surgery:  Clearance 02/06/24                                 Surgeon:  Dr. Renzo Zaldivar Surgeon's Group or Practice Name:  [facility] Phone number:  [phone] Fax number:  [phone]   Type of Clearance Requested:   - Medical  - Pharmacy:  Hold Aspirin  Does not specify   Type of Anesthesia:  MAC   Additional requests/questions:  None  SignedPatrcia Iverson CROME   02/01/2024, 11:14 AM   "

## 2024-02-02 ENCOUNTER — Ambulatory Visit: Payer: Self-pay | Admitting: Family Medicine

## 2024-02-02 ENCOUNTER — Ambulatory Visit: Attending: Student

## 2024-02-02 ENCOUNTER — Telehealth (HOSPITAL_BASED_OUTPATIENT_CLINIC_OR_DEPARTMENT_OTHER): Payer: Self-pay | Admitting: *Deleted

## 2024-02-02 DIAGNOSIS — Z01818 Encounter for other preprocedural examination: Secondary | ICD-10-CM

## 2024-02-02 DIAGNOSIS — Z01810 Encounter for preprocedural cardiovascular examination: Secondary | ICD-10-CM

## 2024-02-02 LAB — CBC WITH DIFFERENTIAL/PLATELET
Basophils Absolute: 0.1 x10E3/uL (ref 0.0–0.2)
Basos: 1 %
EOS (ABSOLUTE): 0.2 x10E3/uL (ref 0.0–0.4)
Eos: 3 %
Hematocrit: 42.7 % (ref 37.5–51.0)
Hemoglobin: 14 g/dL (ref 13.0–17.7)
Immature Grans (Abs): 0 x10E3/uL (ref 0.0–0.1)
Immature Granulocytes: 0 %
Lymphocytes Absolute: 2.2 x10E3/uL (ref 0.7–3.1)
Lymphs: 29 %
MCH: 28.6 pg (ref 26.6–33.0)
MCHC: 32.8 g/dL (ref 31.5–35.7)
MCV: 87 fL (ref 79–97)
Monocytes Absolute: 0.5 x10E3/uL (ref 0.1–0.9)
Monocytes: 7 %
Neutrophils Absolute: 4.6 x10E3/uL (ref 1.4–7.0)
Neutrophils: 60 %
Platelets: 290 x10E3/uL (ref 150–450)
RBC: 4.89 x10E6/uL (ref 4.14–5.80)
RDW: 14.3 % (ref 11.6–15.4)
WBC: 7.6 x10E3/uL (ref 3.4–10.8)

## 2024-02-02 LAB — CMP14+EGFR
ALT: 21 IU/L (ref 0–44)
AST: 22 IU/L (ref 0–40)
Albumin: 4 g/dL (ref 3.7–4.7)
Alkaline Phosphatase: 69 IU/L (ref 48–129)
BUN/Creatinine Ratio: 12 (ref 10–24)
BUN: 15 mg/dL (ref 8–27)
Bilirubin Total: 0.4 mg/dL (ref 0.0–1.2)
CO2: 21 mmol/L (ref 20–29)
Calcium: 9.3 mg/dL (ref 8.6–10.2)
Chloride: 97 mmol/L (ref 96–106)
Creatinine, Ser: 1.26 mg/dL (ref 0.76–1.27)
Globulin, Total: 2.7 g/dL (ref 1.5–4.5)
Glucose: 163 mg/dL — ABNORMAL HIGH (ref 70–99)
Potassium: 3.9 mmol/L (ref 3.5–5.2)
Sodium: 134 mmol/L (ref 134–144)
Total Protein: 6.7 g/dL (ref 6.0–8.5)
eGFR: 56 mL/min/1.73 — ABNORMAL LOW

## 2024-02-02 LAB — VITAMIN B12: Vitamin B-12: 1266 pg/mL — AB (ref 232–1245)

## 2024-02-02 LAB — LIPID PANEL
Chol/HDL Ratio: 3.2 ratio (ref 0.0–5.0)
Cholesterol, Total: 115 mg/dL (ref 100–199)
HDL: 36 mg/dL — ABNORMAL LOW
LDL Chol Calc (NIH): 48 mg/dL (ref 0–99)
Triglycerides: 185 mg/dL — ABNORMAL HIGH (ref 0–149)
VLDL Cholesterol Cal: 31 mg/dL (ref 5–40)

## 2024-02-02 LAB — PSA, TOTAL AND FREE
PSA, Free Pct: 37.1 %
PSA, Free: 0.26 ng/mL
Prostate Specific Ag, Serum: 0.7 ng/mL (ref 0.0–4.0)

## 2024-02-02 LAB — TSH: TSH: 1.53 u[IU]/mL (ref 0.450–4.500)

## 2024-02-02 LAB — HGB A1C W/O EAG: Hgb A1c MFr Bld: 7.6 % — ABNORMAL HIGH (ref 4.8–5.6)

## 2024-02-02 NOTE — Telephone Encounter (Signed)
 I s/w the pt today and per notes from preop APP Barnie Hila, NP add pt on today. In regard to ASA hold; I asked pt when was his last dose of ASA, he tells me over the past weekend. Med rec and consent are done.

## 2024-02-02 NOTE — Progress Notes (Signed)
 "   Virtual Visit via Telephone Note   Because of Timtohy R Estabrooks co-morbid illnesses, he is at least at moderate risk for complications without adequate follow up.  This format is felt to be most appropriate for this patient at this time.  Due to technical limitations with video connection (technology), today's appointment will be conducted as an audio only telehealth visit, and COLEMAN KALAS verbally agreed to proceed in this manner.   All issues noted in this document were discussed and addressed.  No physical exam could be performed with this format.  Evaluation Performed:  Preoperative cardiovascular risk assessment _____________   Date:  02/02/2024   Patient ID:  VONTAE COURT, DOB 01/09/1941, MRN [id number] Patient Location:  Home Provider location:   Office  Primary Care Provider:  Dettinger, Fonda LABOR, MD Primary Cardiologist:  Jayson Sierras, MD  Chief Complaint / Patient Profile   84 y.o. y/o male with a h/o aortic stenosis s/p TAVR 2021, CAD, hyperlipidemia, hypertension, OSA, T2DM, and dementia who is pending bilateral lower eyelid senile ectropion and retraction repair and presents today for telephonic preoperative cardiovascular risk assessment.  History of Present Illness    TERRE ZABRISKIE is a 84 y.o. male who presents via audio/video conferencing for a telehealth visit today.  Pt was last seen in cardiology clinic on 09/09/23 by Dr. Sierras.  At that time Josue JONELLE Gelineau was doing well.  The patient is now pending procedure as outlined above. Since his last visit, he has been doing well since his last visit. He is not as active as he wishes due to back pain, but he does take care of himself around the house.   He denies chest pain, shortness of breath, lower extremity edema, fatigue, palpitations, melena, hematuria, hemoptysis, diaphoresis, weakness, presyncope, syncope, orthopnea, and PND.  Past Medical History    Past Medical History:  Diagnosis  Date   Actinic keratosis 02/02/2021   R upper back   Anxiety    Aortic atherosclerosis 12/19/2017   Aortic stenosis    26 mm Edwards S3U THV November 2021   Arthritis    Bursitis of hip    Cervical spondylosis    Chronic headaches    Coronary atherosclerosis of native coronary artery    BMS nondominant RCA 12/2004, DES x2 to mid LAD 11/2019   Essential hypertension    GERD (gastroesophageal reflux disease)    History of adenomatous polyp of colon    History of kidney stones    History of MI (myocardial infarction) 12/2004   History of viral meningitis 02/28/2018   December 2019   Hyperlipidemia    Internal hemorrhoids    Low back pain    Nocturia more than twice per night    OSA (obstructive sleep apnea)    Sleep apnea    Spinal stenosis    Type 2 diabetes mellitus treated with insulin  (HCC)    UTI (urinary tract infection)    Wears dentures    Upper   Past Surgical History:  Procedure Laterality Date   CARDIAC CATHETERIZATION  06-14-2004  dr morris   total occlusion mD1, RI 30-40%, mRCA nondominant hazy 80% and scattered 30-40%,  ef 71% (medical mangement)   CARDIAC VALVE REPLACEMENT     CARDIOVASCULAR STRESS TEST  09-17-2015   dr sierras   Low risk nuclear study w/ reversible mild small anteroapical defect,  normal LV function and wall motion, nuclear stress ef 61%   CATARACT EXTRACTION W/ INTRAOCULAR LENS  IMPLANT Right 07/2014   COLONOSCOPY  09/10/2008   normal   CORONARY ANGIOPLASTY WITH STENT PLACEMENT  12-17-2004   dr benismhon/ dr obie   nonobstructive cad LAD and LCFx,  BMS x1 to total occlusion RCA    CORONARY ATHERECTOMY  11/22/2019   CORONARY ATHERECTOMY N/A 11/22/2019   Procedure: CORONARY ATHERECTOMY;  Surgeon: Verlin Lonni BIRCH, MD;  Location: [facility] INVASIVE CV LAB;  Service: Cardiovascular;  Laterality: N/A;   CYSTOSCOPY W/ URETEROSCOPY W/ LITHOTRIPSY  08/2010   ERCP  03/14/2008   EYE SURGERY Bilateral    cataracts   JOINT REPLACEMENT      LAPAROSCOPIC CHOLECYSTECTOMY  05/2012   LEFT HEART CATHETERIZATION WITH CORONARY ANGIOGRAM N/A 05/14/2011   Procedure: LEFT HEART CATHETERIZATION WITH CORONARY ANGIOGRAM;  Surgeon: Ozell Fell, MD;  Location: [facility] CATH LAB;  Service: Cardiovascular;  Laterality: N/A;    non-obstructive LM, LAD, LCFx, patent RCA stent   LUMBAR LAMINECTOMY/DECOMPRESSION MICRODISCECTOMY N/A 02/24/2017   Procedure: Microlumbar decompression L4-5, L5-S1;  Surgeon: Duwayne Purchase, MD;  Location: [facility] ORS;  Service: Orthopedics;  Laterality: N/A;  120 mins   RIGHT/LEFT HEART CATH AND CORONARY ANGIOGRAPHY N/A 11/14/2019   Procedure: RIGHT/LEFT HEART CATH AND CORONARY ANGIOGRAPHY;  Surgeon: Verlin Lonni BIRCH, MD;  Location: [facility] INVASIVE CV LAB;  Service: Cardiovascular;  Laterality: N/A;   ROTATOR CUFF REPAIR Right 03/2002   ROTATOR CUFF REPAIR Left 12/11/2015   TEE WITHOUT CARDIOVERSION N/A 12/04/2019   Procedure: TRANSESOPHAGEAL ECHOCARDIOGRAM (TEE);  Surgeon: Verlin Lonni BIRCH, MD;  Location[facility] INVASIVE CV LAB;  Service: Open Heart Surgery;  Laterality: N/A;   TOTAL HIP ARTHROPLASTY Right 12/20/2008   TOTAL HIP ARTHROPLASTY Left 07/05/2018   Procedure: TOTAL HIP ARTHROPLASTY ANTERIOR APPROACH;  Surgeon: Melodi Lerner, MD;  Location: [facility] ORS;  Service: Orthopedics;  Laterality: Left;    TOTAL KNEE ARTHROPLASTY Bilateral left 12-11-2003/  right 07-31-2004   dr melodi [facility]   TRANSCATHETER AORTIC VALVE REPLACEMENT, TRANSFEMORAL N/A 12/04/2019   Procedure: TRANSCATHETER AORTIC VALVE REPLACEMENT, TRANSFEMORAL;  Surgeon: Verlin Lonni BIRCH, MD;  Location: [facility] INVASIVE CV LAB;  Service: Open Heart Surgery;  Laterality: N/A;   UPPER GASTROINTESTINAL ENDOSCOPY  01/08/2008   bx, inlet patch, duodenitis   YAG LASER APPLICATION Right 07/29/2014   Procedure: YAG LASER APPLICATION;  Surgeon: Dow JULIANNA Burke, MD;  Location: [facility] ORS;  Service: Ophthalmology;  Laterality: Right;     Allergies  Allergies[1]  Home Medications    Prior to Admission medications  Medication Sig Start Date End Date Taking? Authorizing Provider  Accu-Chek Softclix Lancets lancets test blood sugar 4 times daily as directed. Dx E11.59 10/26/23   Dettinger, Fonda LABOR, MD  acetaminophen  (TYLENOL ) 650 MG CR tablet Take 1,300 mg by mouth 2 (two) times daily.    [provider]  amLODipine  (NORVASC ) 10 MG tablet Take 1 tablet (10 mg total) by mouth daily. 10/24/23   Dettinger, Fonda LABOR, MD  aspirin  EC 81 MG tablet Take 81 mg by mouth every other day.    [provider]  atorvastatin  (LIPITOR ) 40 MG tablet TAKE 1 TABLET BY MOUTH IN THE  EVENING 06/24/23   Debera Jayson MATSU, MD  Blood Glucose Monitoring Suppl (ACCU-CHEK GUIDE ME) w/Device KIT test blood sugar 4 times daily as directed. Dx E11.59 10/26/23   Dettinger, Fonda LABOR, MD  cetirizine (ZYRTEC) 10 MG tablet Take 10 mg by mouth daily with supper.     [provider]  chlorthalidone  (HYGROTON ) 25 MG tablet TAKE 1 TABLET BY  MOUTH DAILY 06/24/23   Debera Jayson MATSU, MD  Cholecalciferol (VITAMIN D3) 50 MCG (2000 UT) capsule Take 4,000 Units by mouth daily.     [provider]  Continuous Glucose Receiver (DEXCOM G7 RECEIVER) DEVI Use to test blood sugar continuously.  DX: E11.65 patient to use good rx card; receiver only 03/31/23   Dettinger, Fonda LABOR, MD  Continuous Glucose Sensor (DEXCOM G7 SENSOR) MISC DX: E11.9   APPLY SENSOR TO ARM EVERY 10 DAYS. 04/01/23   Dettinger, Fonda LABOR, MD  docusate sodium  (COLACE) 100 MG capsule Take 100 mg by mouth daily as needed for mild constipation or moderate constipation.     [provider]  donepezil  (ARICEPT ) 5 MG tablet TAKE 1 TABLET BY MOUTH AT  BEDTIME 06/24/23   Dettinger, Fonda LABOR, MD  famotidine  (PEPCID ) 20 MG tablet Take 1 tablet (20 mg total) by mouth 2 (two) times daily with a meal. 10/24/23   Dettinger, Fonda LABOR, MD  glucose blood (ACCU-CHEK GUIDE TEST) test  strip test blood sugar 4 times daily as directed. Dx E11.59 10/26/23   Dettinger, Fonda LABOR, MD  hydrocortisone  2.5 % cream Apply topically as directed. Qd aa forehead and posterior neck prn itching 04/05/23   Jackquline Sawyer, MD  Insulin  Glargine (BASAGLAR  KWIKPEN) 100 UNIT/ML Inject 56 Units into the skin daily. 12/23/23   Dettinger, Fonda LABOR, MD  insulin  lispro (HUMALOG  KWIKPEN) 100 UNIT/ML KwikPen Inject 20 Units into the skin 3 (three) times daily with meals. 12/23/23   Dettinger, Fonda LABOR, MD  isosorbide  mononitrate (IMDUR ) 30 MG 24 hr tablet TAKE 1 TABLET BY MOUTH IN THE  EVENING 10/17/23   Debera Jayson MATSU, MD  metFORMIN  (GLUCOPHAGE -XR) 500 MG 24 hr tablet TAKE 2 TABLETS BY MOUTH TWICE  DAILY WITH MEALS 01/16/24   Dettinger, Fonda LABOR, MD  nitroGLYCERIN  (NITROSTAT ) 0.4 MG SL tablet DISSOLVE ONE TABLET UNDER THE TONGUE EVERY 5 MINUTES AS NEEDED FOR CHEST PAIN.  DO NOT EXCEED A TOTAL OF 3 DOSES IN 15 MINUTES 01/19/24   Debera Jayson MATSU, MD  Omega-3 Fatty Acids (FISH OIL) 1200 MG CAPS Take 1,200-2,400 mg by mouth See admin instructions. Tale 1200 mg in the morning and 2400 mg in the evening    [provider]  PARoxetine  (PAXIL ) 20 MG tablet Take 1 tablet (20 mg total) by mouth daily. 10/24/23   Dettinger, Fonda LABOR, MD  Semaglutide , 2 MG/DOSE, (OZEMPIC , 2 MG/DOSE,) 8 MG/3ML SOPN INJECT SUBCUTANEOUSLY 2 MG EVERY WEEK AS DIRECTED 01/23/24   Dettinger, Fonda LABOR, MD  tamsulosin  (FLOMAX ) 0.4 MG CAPS capsule TAKE 1 CAPSULE BY MOUTH DAILY 06/24/23   Dettinger, Fonda LABOR, MD  Turmeric Curcumin 500 MG CAPS Take 500 mg by mouth daily.    [provider]  vitamin B-12 (CYANOCOBALAMIN ) 1000 MCG tablet Take 2,000 mcg by mouth daily.    [provider]    Physical Exam    Vital Signs:  Della R Callender does not have vital signs available for review today.  Given telephonic nature of communication, physical exam is limited. AAOx3. NAD. Normal affect.  Speech and respirations are  unlabored.  Accessory Clinical Findings    None  Assessment & Plan    1.  Preoperative Cardiovascular Risk Assessment:  According to the Revised Cardiac Risk Index (RCRI), his Perioperative Risk of Major Cardiac Event is (%): 6.6 His Functional Capacity in METs is: 4.31 according to the [employer] Activity Status Index (DASI). Per AHA/ACC guidelines, he is deemed acceptable risk for  the planned procedure without additional cardiovascular testing. Will route to surgical team so they are aware.   The patient was advised that if he develops new symptoms prior to surgery to contact our office to arrange for a follow-up visit, and he verbalized understanding.  Regarding ASA therapy, we recommend continuation of ASA throughout the perioperative period.  However, if the surgeon feels that cessation of ASA is required in the perioperative period, it may be stopped 5-7 days prior to surgery with a plan to resume it as soon as felt to be feasible from a surgical standpoint in the post-operative period.  A copy of this note will be routed to requesting surgeon.  Time:   Today, I have spent 10 minutes with the patient with telehealth technology discussing medical history, symptoms, and management plan.     Mardy KATHEE Pizza, FNP  02/02/2024, 3:26 PM     [1]  Allergies Allergen Reactions   Ace Inhibitors Hives, Swelling and Rash    Rash,hives,tongue swelling   Angiotensin Receptor Blockers     Unknown reaction    Oxycodone-Acetaminophen  Other (See Comments)    Unknown reaction   Robaxin [Methocarbamol] Other (See Comments)    Confusion    Toradol  [Ketorolac  Tromethamine ] Other (See Comments)    confusion   Valium Other (See Comments)    Hallucinations; confusion   Doxycycline Hives and Rash   "

## 2024-02-02 NOTE — Telephone Encounter (Signed)
 I s/w the pt today and per notes from preop APP Barnie Hila, NP add pt on today. In regard to ASA hold; I asked pt when was his last dose of ASA, he tells me over the past weekend. Med rec and consent are done.       Patient Consent for Virtual Visit        [name] has provided verbal consent on 02/02/2024 for a virtual visit (video or telephone).   CONSENT FOR VIRTUAL VISIT FOR:  [name]  By participating in this virtual visit I agree to the following:  I hereby voluntarily request, consent and authorize [facility] and its employed or contracted physicians, physician assistants, nurse practitioners or other licensed health care professionals (the Practitioner), to provide me with telemedicine health care services (the Services) as deemed necessary by the treating Practitioner. I acknowledge and consent to receive the Services by the Practitioner via telemedicine. I understand that the telemedicine visit will involve communicating with the Practitioner through live audiovisual communication technology and the disclosure of certain medical information by electronic transmission. I acknowledge that I have been given the opportunity to request an in-person assessment or other available alternative prior to the telemedicine visit and am voluntarily participating in the telemedicine visit.  I understand that I have the right to withhold or withdraw my consent to the use of telemedicine in the course of my care at any time, without affecting my right to future care or treatment, and that the Practitioner or I may terminate the telemedicine visit at any time. I understand that I have the right to inspect all information obtained and/or recorded in the course of the telemedicine visit and may receive copies of available information for a reasonable fee.  I understand that some of the potential risks of receiving the Services via telemedicine include:  Delay or  interruption in medical evaluation due to technological equipment failure or disruption; Information transmitted may not be sufficient (e.g. poor resolution of images) to allow for appropriate medical decision making by the Practitioner; and/or  In rare instances, security protocols could fail, causing a breach of personal health information.  Furthermore, I acknowledge that it is my responsibility to provide information about my medical history, conditions and care that is complete and accurate to the best of my ability. I acknowledge that Practitioner's advice, recommendations, and/or decision may be based on factors not within their control, such as incomplete or inaccurate data provided by me or distortions of diagnostic images or specimens that may result from electronic transmissions. I understand that the practice of medicine is not an exact science and that Practitioner makes no warranties or guarantees regarding treatment outcomes. I acknowledge that a copy of this consent can be made available to me via my patient portal [facility] MyChart), or I can request a printed copy by calling the office of [facility].    I understand that my insurance will be billed for this visit.   I have read or had this consent read to me. I understand the contents of this consent, which adequately explains the benefits and risks of the Services being provided via telemedicine.  I have been provided ample opportunity to ask questions regarding this consent and the Services and have had my questions answered to my satisfaction. I give my informed consent for the services to be provided through the use of telemedicine in my medical care

## 2024-02-07 ENCOUNTER — Telehealth: Payer: Medicare Other | Admitting: Adult Health

## 2024-02-07 DIAGNOSIS — G4733 Obstructive sleep apnea (adult) (pediatric): Secondary | ICD-10-CM | POA: Diagnosis not present

## 2024-02-07 NOTE — Progress Notes (Signed)
 SABRA

## 2024-02-07 NOTE — Progress Notes (Signed)
 "    PATIENT: [name] DOB: 21-Jan-1940  REASON FOR VISIT: follow up HISTORY FROM: patient   Virtual Visit via Video Note  I connected with [name] on 02/07/24 at  1:15 PM EST by a video enabled telemedicine application located remotely at [facility] and verified that I am speaking with the correct person using two identifiers who was located at their own home in [place]   I discussed the limitations of evaluation and management by telemedicine and the availability of in person appointments. The patient expressed understanding and agreed to proceed.   PATIENT: [name] DOB: 1940-01-16  REASON FOR VISIT: follow up HISTORY FROM: patient  HISTORY OF PRESENT ILLNESS: Today 02/07/24:  [name] is a 84 y.o. male with a history of obstructive sleep apnea on CPAP. Returns today for follow-up.  His download is below. Last Study was in 2019.  Patient reports that he still has daytime sleepiness however his sleep apnea is well treated.  I did explain that daytime sleepiness could be multifactoral.  His download is below     02/11/23: [name] is a 84 y.o. male with a history of obstructive sleep apnea on CPAP. Returns today for follow-up.  Reports that CPAP is working well.  He states that he never sleeps without it.  His download is below       REVIEW OF SYSTEMS: Out of a complete 14 system review of symptoms, the patient complains only of the following symptoms, and all other reviewed systems are negative.  ALLERGIES: Allergies  Allergen Reactions   Ace Inhibitors Hives, Swelling and Rash    Rash,hives,tongue swelling   Angiotensin Receptor Blockers     Unknown reaction    Oxycodone-Acetaminophen  Other (See Comments)    Unknown reaction   Robaxin [Methocarbamol] Other (See Comments)    Confusion    Toradol  [Ketorolac  Tromethamine ] Other (See Comments)    confusion   Valium Other (See Comments)    Hallucinations;  confusion   Doxycycline Hives and Rash    HOME MEDICATIONS: Outpatient Medications Prior to Visit  Medication Sig Dispense Refill   Accu-Chek Softclix Lancets lancets test blood sugar 4 times daily as directed. Dx E11.59 400 each 3   acetaminophen  (TYLENOL ) 650 MG CR tablet Take 1,300 mg by mouth 2 (two) times daily.     amLODipine  (NORVASC ) 10 MG tablet Take 1 tablet (10 mg total) by mouth daily. 90 tablet 3   aspirin  EC 81 MG tablet Take 81 mg by mouth every other day.     atorvastatin  (LIPITOR ) 40 MG tablet TAKE 1 TABLET BY MOUTH IN THE  EVENING 100 tablet 1   Blood Glucose Monitoring Suppl (ACCU-CHEK GUIDE ME) w/Device KIT test blood sugar 4 times daily as directed. Dx E11.59 1 kit 0   cetirizine (ZYRTEC) 10 MG tablet Take 10 mg by mouth daily with supper.      chlorthalidone  (HYGROTON ) 25 MG tablet TAKE 1 TABLET BY MOUTH DAILY 100 tablet 1   Cholecalciferol (VITAMIN D3) 50 MCG (2000 UT) capsule Take 4,000 Units by mouth daily.      Continuous Glucose Receiver (DEXCOM G7 RECEIVER) DEVI Use to test blood sugar continuously.  DX: E11.65 patient to use good rx card; receiver only 1 each 0   Continuous Glucose Sensor (DEXCOM G7 SENSOR) MISC DX: E11.9   APPLY SENSOR TO ARM EVERY 10 DAYS. 3 each 3   docusate sodium  (COLACE) 100 MG capsule Take 100 mg  by mouth daily as needed for mild constipation or moderate constipation.      donepezil  (ARICEPT ) 5 MG tablet TAKE 1 TABLET BY MOUTH AT  BEDTIME 100 tablet 1   famotidine  (PEPCID ) 20 MG tablet Take 1 tablet (20 mg total) by mouth 2 (two) times daily with a meal. 200 tablet 3   glucose blood (ACCU-CHEK GUIDE TEST) test strip test blood sugar 4 times daily as directed. Dx E11.59 400 each 3   hydrocortisone  2.5 % cream Apply topically as directed. Qd aa forehead and posterior neck prn itching 30 g 11   Insulin  Glargine (BASAGLAR  KWIKPEN) 100 UNIT/ML Inject 56 Units into the skin daily. 45 mL 5   insulin  lispro (HUMALOG  KWIKPEN) 100 UNIT/ML KwikPen  Inject 20 Units into the skin 3 (three) times daily with meals. 45 mL 5   isosorbide  mononitrate (IMDUR ) 30 MG 24 hr tablet TAKE 1 TABLET BY MOUTH IN THE  EVENING 90 tablet 3   metFORMIN  (GLUCOPHAGE -XR) 500 MG 24 hr tablet TAKE 2 TABLETS BY MOUTH TWICE  DAILY WITH MEALS 400 tablet 0   nitroGLYCERIN  (NITROSTAT ) 0.4 MG SL tablet DISSOLVE ONE TABLET UNDER THE TONGUE EVERY 5 MINUTES AS NEEDED FOR CHEST PAIN.  DO NOT EXCEED A TOTAL OF 3 DOSES IN 15 MINUTES 25 tablet 7   Omega-3 Fatty Acids (FISH OIL) 1200 MG CAPS Take 1,200-2,400 mg by mouth See admin instructions. Tale 1200 mg in the morning and 2400 mg in the evening     PARoxetine  (PAXIL ) 20 MG tablet Take 1 tablet (20 mg total) by mouth daily. 100 tablet 3   Semaglutide , 2 MG/DOSE, (OZEMPIC , 2 MG/DOSE,) 8 MG/3ML SOPN INJECT SUBCUTANEOUSLY 2 MG EVERY WEEK AS DIRECTED 9 mL 0   tamsulosin  (FLOMAX ) 0.4 MG CAPS capsule TAKE 1 CAPSULE BY MOUTH DAILY 100 capsule 1   Turmeric Curcumin 500 MG CAPS Take 500 mg by mouth daily.     vitamin B-12 (CYANOCOBALAMIN ) 1000 MCG tablet Take 2,000 mcg by mouth daily.     No facility-administered medications prior to visit.    PAST MEDICAL HISTORY: Past Medical History:  Diagnosis Date   Actinic keratosis 02/02/2021   R upper back   Anxiety    Aortic atherosclerosis 12/19/2017   Aortic stenosis    26 mm Edwards S3U THV November 2021   Arthritis    Bursitis of hip    Cervical spondylosis    Chronic headaches    Coronary atherosclerosis of native coronary artery    BMS nondominant RCA 12/2004, DES x2 to mid LAD 11/2019   Essential hypertension    GERD (gastroesophageal reflux disease)    History of adenomatous polyp of colon    History of kidney stones    History of MI (myocardial infarction) 12/2004   History of viral meningitis 02/28/2018   December 2019   Hyperlipidemia    Internal hemorrhoids    Low back pain    Nocturia more than twice per night    OSA (obstructive sleep apnea)    Sleep apnea     Spinal stenosis    Type 2 diabetes mellitus treated with insulin  (HCC)    UTI (urinary tract infection)    Wears dentures    Upper    PAST SURGICAL HISTORY: Past Surgical History:  Procedure Laterality Date   CARDIAC CATHETERIZATION  06-14-2004  dr morris   total occlusion mD1, RI 30-40%, mRCA nondominant hazy 80% and scattered 30-40%,  ef 71% (medical mangement)   CARDIAC VALVE REPLACEMENT  CARDIOVASCULAR STRESS TEST  09-17-2015   dr debera   Low risk nuclear study w/ reversible mild small anteroapical defect,  normal LV function and wall motion, nuclear stress ef 61%   CATARACT EXTRACTION W/ INTRAOCULAR LENS IMPLANT Right 07/2014   COLONOSCOPY  09/10/2008   normal   CORONARY ANGIOPLASTY WITH STENT PLACEMENT  12-17-2004   dr benismhon/ dr obie   nonobstructive cad LAD and LCFx,  BMS x1 to total occlusion RCA    CORONARY ATHERECTOMY  11/22/2019   CORONARY ATHERECTOMY N/A 11/22/2019   Procedure: CORONARY ATHERECTOMY;  Surgeon: Verlin Lonni BIRCH, MD;  Location: [facility] IN[facility]ASIVE CV LAB;  Service: Cardiovascular;  Laterality: N/A;   CYSTOSCOPY W/ URETEROSCOPY W/ LITHOTRIPSY  08/2010   ERCP  03/14/2008   EYE SURGERY Bilateral    cataracts   JOINT REPLACEMENT     LAPAROSCOPIC CHOLECYSTECTOMY  05/2012   LEFT HEART CATHETERIZATION WITH CORONARY ANGIOGRAM N/A 05/14/2011   Procedure: LEFT HEART CATHETERIZATION WITH CORONARY ANGIOGRAM;  Surgeon: Ozell Fell, MD;  Location: [facility] CATH LAB;  Service: Cardiovascular;  Laterality: N/A;    non-obstructive LM, LAD, LCFx, patent RCA stent   LUMBAR LAMINECTOMY/DECOMPRESSION MICRODISCECTOMY N/A 02/24/2017   Procedure: Microlumbar decompression L4-5, L5-S1;  Surgeon: Duwayne Purchase, MD;  Location: [facility] ORS;  Service: Orthopedics;  Laterality: N/A;  120 mins   RIGHT/LEFT HEART CATH AND CORONARY ANGIOGRAPHY N/A 11/14/2019   Procedure: RIGHT/LEFT HEART CATH AND CORONARY ANGIOGRAPHY;  Surgeon: Verlin Lonni BIRCH, MD;  Location: [facility] CV  [facility];  Service: Cardiovascular;  Laterality: N/A;   ROTATOR CUFF REPAIR Right 03/2002   ROTATOR CUFF REPAIR Left 12/11/2015   TEE WITHOUT CARDIOVERSION N/A 12/04/2019   Procedure: TRANSESOPHAGEAL ECHOCARDIOGRAM (TEE);  Surgeon: Verlin Lonni BIRCH, MD;  Location: [facility] INVASIVE CV LAB;  Service: Open Heart Surgery;  Laterality: N/A;   TOTAL HIP ARTHROPLASTY Right 12/20/2008   TOTAL HIP ARTHROPLASTY Left 07/05/2018   Procedure: TOTAL HIP ARTHROPLASTY ANTERIOR APPROACH;  Surgeon: Melodi Lerner, MD;  Location: [facility] ORS;  Service: Orthopedics;  Laterality: Left;    TOTAL KNEE ARTHROPLASTY Bilateral left 12-11-2003/  right 07-31-2004   dr melodi [facility]   TRANSCATHETER AORTIC VALVE REPLACEMENT, TRANSFEMORAL N/A 12/04/2019   Procedure: TRANSCATHETER AORTIC VALVE REPLACEMENT, TRANSFEMORAL;  Surgeon: Verlin Lonni BIRCH, MD;  Location: [facility] CV [facility];  Service: Open Heart Surgery;  Laterality: N/A;   UPPER GASTROINTESTINAL ENDOSCOPY  01/08/2008   bx, inlet patch, duodenitis   YAG LASER APPLICATION Right 07/29/2014   Procedure: YAG LASER APPLICATION;  Surgeon: Dow JULIANNA Burke, MD;  Location: [facility]S;  Service: Ophthalmology;  Laterality: Right;    FAMILY HISTORY: Family History  Problem Relation Age of Onset   Colon cancer Mother        Diagnosed age 7   Cancer Mother 42   Heart disease Father    Heart attack Father    Breast cancer Sister    Brain cancer Sister    Heart disease Brother    Heart disease Brother    Bladder Cancer Brother    Cancer - Other Brother    Sleep apnea Neg Hx     SOCIAL HISTORY: Social History   Socioeconomic History   Marital status: Married    Spouse name: Dione    Number of children: 1   Years of education: HS   Highest education level: 12th grade  Occupational History   Occupation: RETIRED    Employer: [employer]    Comment: Power plant  Tobacco Use  Smoking status: Former    Current packs/day: 0.00    Average packs/day: 1 pack/day  for 25.6 years (25.6 ttl pk-yrs)    Types: Cigarettes    Start date: 01/18/1963    Quit date: 08/11/1988    Years since quitting: 35.5   Smokeless tobacco: Former    Types: Chew    Quit date: 01/11/1989   Tobacco comments:    chewed 1 pack tobacco/day for 15 years  Vaping Use   Vaping status: Never Used  Substance and Sexual Activity   Alcohol  use: No   Drug use: No   Sexual activity: Not Currently  Other Topics Concern   Not on file  Social History Narrative   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 between [phone]   Social Drivers of Health   Tobacco Use: Medium Risk (02/02/2024)   Patient History    Smoking Tobacco Use: Former    Smokeless Tobacco Use: Former    Passive Exposure: Not on Actuary Strain: Low Risk (10/24/2023)   Overall Financial Resource Strain (CARDIA)    Difficulty of Paying Living Expenses: Not very hard  Food Insecurity: No Food Insecurity (10/24/2023)   Epic    Worried About Programme Researcher, Broadcasting/film/video in the Last Year: Never true    Ran Out of Food in the Last Year: Never true  Transportation Needs: No Transportation Needs (10/24/2023)   Epic    Lack of Transportation (Medical): No    Lack of Transportation (Non-Medical): No  Physical Activity: Unknown (10/24/2023)   Exercise Vital Sign    Days of Exercise per Week: Patient declined    Minutes of Exercise per Session: Not on file  Stress: No Stress Concern Present (10/24/2023)   [employer] of Occupational Health - Occupational Stress Questionnaire    Feeling of Stress: Only a little  Social Connections: Unknown (10/24/2023)   Social Connection and Isolation Panel    Frequency of Communication with Friends and Family: More than three times a week    Frequency of Social Gatherings with Friends and Family: More  than three times a week    Attends Religious Services: Patient declined    Database Administrator or Organizations: Patient declined    Attends Banker Meetings: Not on file    Marital Status: Married  Intimate Partner Violence: Not At Risk (02/10/2023)   Humiliation, Afraid, Rape, and Kick questionnaire    Fear of Current or Ex-Partner: No    Emotionally Abused: No    Physically Abused: No    Sexually Abused: No  Depression (PHQ2-9): Medium Risk (02/01/2024)   Depression (PHQ2-9)    PHQ-2 Score: 5  Alcohol  Screen: Low Risk (06/23/2023)   Alcohol  Screen    Last Alcohol  Screening Score (AUDIT): 1  Housing: Low Risk (10/24/2023)   Epic    Unable to Pay for Housing in the Last Year: No    Number of Times Moved in the Last Year: 0    Homeless in the Last Year: No  Utilities: Not At Risk (02/10/2023)   AHC Utilities    Threatened with loss of utilities: No  Health Literacy: Adequate Health Literacy (02/10/2023)   B1300 Health Literacy    Frequency of need for help  with medical instructions: Never      PHYSICAL EXAM Generalized: Well developed, in no acute distress   Neurological examination  Mentation: Alert oriented to time, place, history taking. Follows all commands speech and language fluent Cranial nerve II-XII:Extraocular movements were full. Facial symmetry noted.   DIAGNOSTIC DATA (LABS, IMAGING, TESTING) - I reviewed patient records, labs, notes, testing and imaging myself where available.  Lab Results  Component Value Date   WBC 7.6 02/01/2024   HGB 14.0 02/01/2024   HCT 42.7 02/01/2024   MCV 87 02/01/2024   PLT 290 02/01/2024      Component Value Date/Time   NA 134 02/01/2024 0930   K 3.9 02/01/2024 0930   CL 97 02/01/2024 0930   CO2 21 02/01/2024 0930   GLUCOSE 163 (H) 02/01/2024 0930   GLUCOSE 192 (H) 07/07/2020 0645   BUN 15 02/01/2024 0930   CREATININE 1.26 02/01/2024 0930   CREATININE 0.93 06/02/2012 1007   CALCIUM  9.3 02/01/2024 0930    PROT 6.7 02/01/2024 0930   ALBUMIN 4.0 02/01/2024 0930   AST 22 02/01/2024 0930   ALT 21 02/01/2024 0930   ALKPHOS 69 02/01/2024 0930   BILITOT 0.4 02/01/2024 0930   GFRNONAA >60 07/07/2020 0645   GFRNONAA 82 06/02/2012 1007   GFRAA 85 12/28/2019 0951   GFRAA >89 06/02/2012 1007   Lab Results  Component Value Date   CHOL 115 02/01/2024   HDL 36 (L) 02/01/2024   LDLCALC 48 02/01/2024   TRIG 185 (H) 02/01/2024   CHOLHDL 3.2 02/01/2024   Lab Results  Component Value Date   HGBA1C 7.2 (H) 02/01/2024   Lab Results  Component Value Date   VITAMINB12 1,266 (H) 02/01/2024   Lab Results  Component Value Date   TSH 1.530 02/01/2024      ASSESSMENT AND PLAN 84 y.o. year old male  has a past medical history of Actinic keratosis (02/02/2021), Anxiety, Aortic atherosclerosis (12/19/2017), Aortic stenosis, Arthritis, Bursitis of hip, Cervical spondylosis, Chronic headaches, Coronary atherosclerosis of native coronary artery, Essential hypertension, GERD (gastroesophageal reflux disease), History of adenomatous polyp of colon, History of kidney stones, History of MI (myocardial infarction) (12/2004), History of viral meningitis (02/28/2018), Hyperlipidemia, Internal hemorrhoids, Low back pain, Nocturia more than twice per night, OSA (obstructive sleep apnea), Sleep apnea, Spinal stenosis, Type 2 diabetes mellitus treated with insulin  (HCC), UTI (urinary tract infection), and Wears dentures. here with:  OSA on CPAP  CPAP compliance excellent Residual AHI is good Encouraged patient to continue using CPAP nightly and > 4 hours each night F/U in 1 year or sooner if needed   Duwaine Russell, MSN, NP-C 02/07/2024, 1:09 PM [facility] [address] [place], [place] [address] [phone]  "

## 2024-02-10 ENCOUNTER — Ambulatory Visit: Payer: Medicare Other

## 2024-02-13 ENCOUNTER — Ambulatory Visit: Payer: Medicare Other

## 2024-02-16 ENCOUNTER — Other Ambulatory Visit: Payer: Self-pay | Admitting: Family Medicine

## 2024-03-07 ENCOUNTER — Ambulatory Visit: Admitting: Cardiology

## 2024-03-15 ENCOUNTER — Ambulatory Visit

## 2024-05-01 ENCOUNTER — Encounter: Admitting: Dermatology
